# Patient Record
Sex: Female | Born: 1945
Health system: Southern US, Community
[De-identification: ages and names within clinical notes are randomized; demographics above are authoritative.]

## PROBLEM LIST (undated history)

## (undated) DIAGNOSIS — I10 Essential (primary) hypertension: Secondary | ICD-10-CM

## (undated) DIAGNOSIS — I480 Paroxysmal atrial fibrillation: Secondary | ICD-10-CM

## (undated) DIAGNOSIS — K219 Gastro-esophageal reflux disease without esophagitis: Secondary | ICD-10-CM

## (undated) DIAGNOSIS — T7840XA Allergy, unspecified, initial encounter: Secondary | ICD-10-CM

## (undated) DIAGNOSIS — I499 Cardiac arrhythmia, unspecified: Secondary | ICD-10-CM

## (undated) DIAGNOSIS — I341 Nonrheumatic mitral (valve) prolapse: Secondary | ICD-10-CM

## (undated) DIAGNOSIS — H269 Unspecified cataract: Secondary | ICD-10-CM

## (undated) DIAGNOSIS — E785 Hyperlipidemia, unspecified: Secondary | ICD-10-CM

## (undated) DIAGNOSIS — Z8049 Family history of malignant neoplasm of other genital organs: Secondary | ICD-10-CM

## (undated) DIAGNOSIS — G4730 Sleep apnea, unspecified: Secondary | ICD-10-CM

## (undated) DIAGNOSIS — Z801 Family history of malignant neoplasm of trachea, bronchus and lung: Secondary | ICD-10-CM

## (undated) DIAGNOSIS — E039 Hypothyroidism, unspecified: Secondary | ICD-10-CM

## (undated) DIAGNOSIS — M1990 Unspecified osteoarthritis, unspecified site: Secondary | ICD-10-CM

## (undated) DIAGNOSIS — M810 Age-related osteoporosis without current pathological fracture: Secondary | ICD-10-CM

## (undated) DIAGNOSIS — C801 Malignant (primary) neoplasm, unspecified: Secondary | ICD-10-CM

## (undated) DIAGNOSIS — Z9289 Personal history of other medical treatment: Secondary | ICD-10-CM

## (undated) DIAGNOSIS — Z8042 Family history of malignant neoplasm of prostate: Secondary | ICD-10-CM

## (undated) DIAGNOSIS — M797 Fibromyalgia: Secondary | ICD-10-CM

## (undated) DIAGNOSIS — I8390 Asymptomatic varicose veins of unspecified lower extremity: Secondary | ICD-10-CM

## (undated) DIAGNOSIS — E559 Vitamin D deficiency, unspecified: Secondary | ICD-10-CM

## (undated) DIAGNOSIS — E079 Disorder of thyroid, unspecified: Secondary | ICD-10-CM

## (undated) DIAGNOSIS — F419 Anxiety disorder, unspecified: Secondary | ICD-10-CM

## (undated) HISTORY — DX: Hyperlipidemia, unspecified: E78.5

## (undated) HISTORY — DX: Family history of malignant neoplasm of prostate: Z80.42

## (undated) HISTORY — DX: Nonrheumatic mitral (valve) prolapse: I34.1

## (undated) HISTORY — DX: Unspecified cataract: H26.9

## (undated) HISTORY — DX: Gastro-esophageal reflux disease without esophagitis: K21.9

## (undated) HISTORY — DX: Age-related osteoporosis without current pathological fracture: M81.0

## (undated) HISTORY — DX: Disorder of thyroid, unspecified: E07.9

## (undated) HISTORY — DX: Family history of malignant neoplasm of other genital organs: Z80.49

## (undated) HISTORY — DX: Family history of malignant neoplasm of trachea, bronchus and lung: Z80.1

## (undated) HISTORY — DX: Personal history of other medical treatment: Z92.89

## (undated) HISTORY — DX: Unspecified osteoarthritis, unspecified site: M19.90

## (undated) HISTORY — DX: Allergy, unspecified, initial encounter: T78.40XA

## (undated) HISTORY — DX: Fibromyalgia: M79.7

## (undated) HISTORY — DX: Asymptomatic varicose veins of unspecified lower extremity: I83.90

## (undated) HISTORY — DX: Sleep apnea, unspecified: G47.30

## (undated) HISTORY — DX: Paroxysmal atrial fibrillation: I48.0

## (undated) HISTORY — DX: Vitamin D deficiency, unspecified: E55.9

---

## 1987-01-11 HISTORY — PX: APPENDECTOMY: SHX54

## 1987-01-11 HISTORY — PX: ABDOMINAL HYSTERECTOMY: SHX81

## 1988-01-11 HISTORY — PX: NOSE SURGERY: SHX723

## 1993-01-10 HISTORY — PX: BREAST EXCISIONAL BIOPSY: SUR124

## 1996-06-06 ENCOUNTER — Encounter: Payer: Self-pay | Admitting: Internal Medicine

## 1997-11-26 ENCOUNTER — Ambulatory Visit (HOSPITAL_COMMUNITY): Admission: RE | Admit: 1997-11-26 | Discharge: 1997-11-26 | Payer: Self-pay | Admitting: Internal Medicine

## 1997-11-26 ENCOUNTER — Encounter: Payer: Self-pay | Admitting: Internal Medicine

## 1997-12-02 ENCOUNTER — Ambulatory Visit (HOSPITAL_COMMUNITY): Admission: RE | Admit: 1997-12-02 | Discharge: 1997-12-02 | Payer: Self-pay

## 1997-12-16 ENCOUNTER — Ambulatory Visit (HOSPITAL_COMMUNITY): Admission: RE | Admit: 1997-12-16 | Discharge: 1997-12-16 | Payer: Self-pay | Admitting: General Surgery

## 1997-12-16 ENCOUNTER — Encounter: Payer: Self-pay | Admitting: General Surgery

## 1998-03-25 ENCOUNTER — Other Ambulatory Visit: Admission: RE | Admit: 1998-03-25 | Discharge: 1998-03-25 | Payer: Self-pay | Admitting: Obstetrics and Gynecology

## 1999-01-14 ENCOUNTER — Ambulatory Visit (HOSPITAL_COMMUNITY): Admission: RE | Admit: 1999-01-14 | Discharge: 1999-01-14 | Payer: Self-pay | Admitting: Internal Medicine

## 1999-01-14 ENCOUNTER — Encounter: Payer: Self-pay | Admitting: Internal Medicine

## 1999-06-28 ENCOUNTER — Other Ambulatory Visit: Admission: RE | Admit: 1999-06-28 | Discharge: 1999-06-28 | Payer: Self-pay | Admitting: Obstetrics and Gynecology

## 2000-01-19 ENCOUNTER — Ambulatory Visit (HOSPITAL_COMMUNITY): Admission: RE | Admit: 2000-01-19 | Discharge: 2000-01-19 | Payer: Self-pay | Admitting: Internal Medicine

## 2000-01-19 ENCOUNTER — Encounter: Payer: Self-pay | Admitting: Internal Medicine

## 2000-06-27 ENCOUNTER — Other Ambulatory Visit: Admission: RE | Admit: 2000-06-27 | Discharge: 2000-06-27 | Payer: Self-pay | Admitting: Obstetrics and Gynecology

## 2000-11-20 ENCOUNTER — Encounter: Payer: Self-pay | Admitting: Internal Medicine

## 2000-11-20 HISTORY — PX: COLONOSCOPY: SHX174

## 2001-07-25 ENCOUNTER — Encounter: Payer: Self-pay | Admitting: Internal Medicine

## 2001-07-25 ENCOUNTER — Ambulatory Visit (HOSPITAL_COMMUNITY): Admission: RE | Admit: 2001-07-25 | Discharge: 2001-07-25 | Payer: Self-pay | Admitting: Internal Medicine

## 2001-11-26 ENCOUNTER — Other Ambulatory Visit: Admission: RE | Admit: 2001-11-26 | Discharge: 2001-11-26 | Payer: Self-pay | Admitting: *Deleted

## 2002-11-26 ENCOUNTER — Ambulatory Visit (HOSPITAL_COMMUNITY): Admission: RE | Admit: 2002-11-26 | Discharge: 2002-11-26 | Payer: Self-pay | Admitting: Internal Medicine

## 2002-11-29 ENCOUNTER — Other Ambulatory Visit: Admission: RE | Admit: 2002-11-29 | Discharge: 2002-11-29 | Payer: Self-pay | Admitting: Obstetrics and Gynecology

## 2002-12-16 ENCOUNTER — Ambulatory Visit (HOSPITAL_COMMUNITY): Admission: RE | Admit: 2002-12-16 | Discharge: 2002-12-16 | Payer: Self-pay | Admitting: Internal Medicine

## 2003-02-23 ENCOUNTER — Emergency Department (HOSPITAL_COMMUNITY): Admission: EM | Admit: 2003-02-23 | Discharge: 2003-02-24 | Payer: Self-pay | Admitting: *Deleted

## 2003-12-30 ENCOUNTER — Ambulatory Visit: Payer: Self-pay

## 2004-01-08 ENCOUNTER — Ambulatory Visit (HOSPITAL_COMMUNITY): Admission: RE | Admit: 2004-01-08 | Discharge: 2004-01-08 | Payer: Self-pay | Admitting: Internal Medicine

## 2005-01-26 ENCOUNTER — Ambulatory Visit: Payer: Self-pay | Admitting: Internal Medicine

## 2005-04-14 ENCOUNTER — Ambulatory Visit: Payer: Self-pay | Admitting: Endocrinology

## 2005-05-11 ENCOUNTER — Encounter: Payer: Self-pay | Admitting: Internal Medicine

## 2005-06-10 ENCOUNTER — Ambulatory Visit: Payer: Self-pay | Admitting: Internal Medicine

## 2005-06-23 ENCOUNTER — Ambulatory Visit: Payer: Self-pay | Admitting: Internal Medicine

## 2005-06-29 ENCOUNTER — Ambulatory Visit: Payer: Self-pay | Admitting: Internal Medicine

## 2006-03-28 ENCOUNTER — Ambulatory Visit: Payer: Self-pay | Admitting: Internal Medicine

## 2006-03-28 LAB — CONVERTED CEMR LAB
ALT: 11 units/L (ref 0–40)
AST: 16 units/L (ref 0–37)
Albumin: 3.5 g/dL (ref 3.5–5.2)
Alkaline Phosphatase: 63 units/L (ref 39–117)
BUN: 11 mg/dL (ref 6–23)
Basophils Absolute: 0.1 10*3/uL (ref 0.0–0.1)
Basophils Relative: 0.9 % (ref 0.0–1.0)
Bilirubin, Direct: 0.1 mg/dL (ref 0.0–0.3)
CO2: 30 meq/L (ref 19–32)
Calcium: 9.2 mg/dL (ref 8.4–10.5)
Chloride: 105 meq/L (ref 96–112)
Cholesterol: 199 mg/dL (ref 0–200)
Creatinine, Ser: 0.7 mg/dL (ref 0.4–1.2)
Eosinophils Absolute: 0.9 10*3/uL — ABNORMAL HIGH (ref 0.0–0.6)
Eosinophils Relative: 12.5 % — ABNORMAL HIGH (ref 0.0–5.0)
GFR calc Af Amer: 109 mL/min
GFR calc non Af Amer: 90 mL/min
Glucose, Bld: 88 mg/dL (ref 70–99)
HCT: 40.5 % (ref 36.0–46.0)
HDL: 52.2 mg/dL (ref >39.0)
Hemoglobin: 14.1 g/dL (ref 12.0–15.0)
LDL Cholesterol: 126 mg/dL — ABNORMAL HIGH (ref 0–99)
Lymphocytes Relative: 31.7 % (ref 12.0–46.0)
MCHC: 34.8 g/dL (ref 30.0–36.0)
MCV: 90.6 fL (ref 78.0–100.0)
Monocytes Absolute: 0.5 10*3/uL (ref 0.2–0.7)
Monocytes Relative: 7.3 % (ref 3.0–11.0)
Neutro Abs: 3.6 10*3/uL (ref 1.4–7.7)
Neutrophils Relative %: 47.6 % (ref 43.0–77.0)
Platelets: 217 10*3/uL (ref 150–400)
Potassium: 4 meq/L (ref 3.5–5.1)
RBC: 4.47 M/uL (ref 3.87–5.11)
RDW: 12.9 % (ref 11.5–14.6)
Sodium: 139 meq/L (ref 135–145)
TSH: 0.68 microintl units/mL (ref 0.35–5.50)
Total Bilirubin: 0.7 mg/dL (ref 0.3–1.2)
Total CHOL/HDL Ratio: 3.8
Total Protein: 6.3 g/dL (ref 6.0–8.3)
Triglycerides: 105 mg/dL (ref 0–149)
VLDL: 21 mg/dL (ref 0–40)
WBC: 7.5 10*3/uL (ref 4.5–10.5)

## 2006-04-24 ENCOUNTER — Ambulatory Visit: Payer: Self-pay | Admitting: Internal Medicine

## 2006-05-05 ENCOUNTER — Ambulatory Visit: Payer: Self-pay | Admitting: Internal Medicine

## 2006-05-08 ENCOUNTER — Ambulatory Visit: Payer: Self-pay

## 2006-07-03 ENCOUNTER — Ambulatory Visit: Payer: Self-pay | Admitting: Internal Medicine

## 2006-07-03 LAB — CONVERTED CEMR LAB: TSH: 0.78 microintl units/mL (ref 0.35–5.50)

## 2006-07-07 DIAGNOSIS — M949 Disorder of cartilage, unspecified: Secondary | ICD-10-CM

## 2006-07-07 DIAGNOSIS — M899 Disorder of bone, unspecified: Secondary | ICD-10-CM

## 2006-07-07 DIAGNOSIS — IMO0001 Reserved for inherently not codable concepts without codable children: Secondary | ICD-10-CM

## 2006-07-07 DIAGNOSIS — E039 Hypothyroidism, unspecified: Secondary | ICD-10-CM

## 2006-08-03 ENCOUNTER — Telehealth: Payer: Self-pay | Admitting: Internal Medicine

## 2006-08-07 ENCOUNTER — Ambulatory Visit (HOSPITAL_COMMUNITY): Admission: RE | Admit: 2006-08-07 | Discharge: 2006-08-07 | Payer: Self-pay | Admitting: Internal Medicine

## 2006-09-30 ENCOUNTER — Encounter: Payer: Self-pay | Admitting: *Deleted

## 2006-11-02 ENCOUNTER — Telehealth: Payer: Self-pay | Admitting: Internal Medicine

## 2006-11-03 ENCOUNTER — Telehealth: Payer: Self-pay | Admitting: Internal Medicine

## 2006-11-06 ENCOUNTER — Telehealth: Payer: Self-pay | Admitting: Internal Medicine

## 2006-12-06 ENCOUNTER — Ambulatory Visit: Payer: Self-pay | Admitting: Internal Medicine

## 2006-12-08 LAB — CONVERTED CEMR LAB
ALT: 12 units/L (ref 0–35)
AST: 13 units/L (ref 0–37)
Albumin: 3.8 g/dL (ref 3.5–5.2)
Alkaline Phosphatase: 70 units/L (ref 39–117)
BUN: 9 mg/dL (ref 6–23)
Bilirubin, Direct: 0.1 mg/dL (ref 0.0–0.3)
CO2: 28 meq/L (ref 19–32)
Calcium: 9.5 mg/dL (ref 8.4–10.5)
Chloride: 103 meq/L (ref 96–112)
Creatinine, Ser: 0.7 mg/dL (ref 0.4–1.2)
GFR calc Af Amer: 109 mL/min
GFR calc non Af Amer: 90 mL/min
Glucose, Bld: 95 mg/dL (ref 70–99)
Potassium: 3.8 meq/L (ref 3.5–5.1)
Sodium: 141 meq/L (ref 135–145)
Total Bilirubin: 0.8 mg/dL (ref 0.3–1.2)
Total Protein: 6.5 g/dL (ref 6.0–8.3)

## 2006-12-11 ENCOUNTER — Encounter: Admission: RE | Admit: 2006-12-11 | Discharge: 2006-12-11 | Payer: Self-pay | Admitting: Internal Medicine

## 2006-12-13 ENCOUNTER — Telehealth: Payer: Self-pay | Admitting: Internal Medicine

## 2007-01-12 ENCOUNTER — Telehealth: Payer: Self-pay | Admitting: Internal Medicine

## 2007-04-04 ENCOUNTER — Telehealth: Payer: Self-pay | Admitting: Internal Medicine

## 2007-08-08 ENCOUNTER — Ambulatory Visit: Payer: Self-pay | Admitting: Internal Medicine

## 2007-08-09 ENCOUNTER — Encounter: Payer: Self-pay | Admitting: Internal Medicine

## 2007-08-10 LAB — CONVERTED CEMR LAB
ALT: 13 units/L (ref 0–35)
Albumin: 3.9 g/dL (ref 3.5–5.2)
Alkaline Phosphatase: 70 units/L (ref 39–117)
BUN: 11 mg/dL (ref 6–23)
Bilirubin, Direct: 0.1 mg/dL (ref 0.0–0.3)
CO2: 31 meq/L (ref 19–32)
Eosinophils Relative: 10.4 % — ABNORMAL HIGH (ref 0.0–5.0)
Glucose, Bld: 88 mg/dL (ref 70–99)
HCT: 40.2 % (ref 36.0–46.0)
Hemoglobin: 13.7 g/dL (ref 12.0–15.0)
Lymphocytes Relative: 29.9 % (ref 12.0–46.0)
Monocytes Absolute: 0.5 10*3/uL (ref 0.1–1.0)
Monocytes Relative: 7.1 % (ref 3.0–12.0)
Neutro Abs: 3.6 10*3/uL (ref 1.4–7.7)
Platelets: 177 10*3/uL (ref 150–400)
Potassium: 4.1 meq/L (ref 3.5–5.1)
RDW: 12.9 % (ref 11.5–14.6)
Sodium: 142 meq/L (ref 135–145)
Total Protein: 6.5 g/dL (ref 6.0–8.3)
WBC: 6.8 10*3/uL (ref 4.5–10.5)

## 2007-10-10 ENCOUNTER — Ambulatory Visit (HOSPITAL_COMMUNITY): Admission: RE | Admit: 2007-10-10 | Discharge: 2007-10-10 | Payer: Self-pay | Admitting: Internal Medicine

## 2007-12-20 ENCOUNTER — Telehealth: Payer: Self-pay | Admitting: Internal Medicine

## 2008-03-12 ENCOUNTER — Ambulatory Visit: Payer: Self-pay | Admitting: Internal Medicine

## 2008-03-14 ENCOUNTER — Telehealth: Payer: Self-pay | Admitting: Internal Medicine

## 2008-03-20 ENCOUNTER — Telehealth: Payer: Self-pay | Admitting: Internal Medicine

## 2008-04-01 ENCOUNTER — Telehealth: Payer: Self-pay | Admitting: Internal Medicine

## 2008-04-02 ENCOUNTER — Telehealth: Payer: Self-pay | Admitting: Internal Medicine

## 2008-04-15 ENCOUNTER — Ambulatory Visit: Payer: Self-pay | Admitting: Internal Medicine

## 2008-04-15 DIAGNOSIS — I7300 Raynaud's syndrome without gangrene: Secondary | ICD-10-CM

## 2008-04-15 LAB — CONVERTED CEMR LAB
Nitrite: NEGATIVE
Protein, U semiquant: NEGATIVE
Urobilinogen, UA: 0.2
WBC Urine, dipstick: NEGATIVE
pH: 6.5

## 2008-04-22 LAB — CONVERTED CEMR LAB
ALT: 11 units/L (ref 0–35)
AST: 15 units/L (ref 0–37)
BUN: 12 mg/dL (ref 6–23)
Basophils Relative: 0.9 % (ref 0.0–3.0)
Bilirubin, Direct: 0.1 mg/dL (ref 0.0–0.3)
Eosinophils Relative: 9.5 % — ABNORMAL HIGH (ref 0.0–5.0)
GFR calc non Af Amer: 89.52 mL/min (ref >60)
HCT: 39.4 % (ref 36.0–46.0)
HDL: 59.3 mg/dL (ref >39.00)
Lymphs Abs: 2.1 10*3/uL (ref 0.7–4.0)
Monocytes Relative: 6 % (ref 3.0–12.0)
Platelets: 194 10*3/uL (ref 150.0–400.0)
Potassium: 3.5 meq/L (ref 3.5–5.1)
RBC: 4.36 M/uL (ref 3.87–5.11)
Sodium: 141 meq/L (ref 135–145)
TSH: 0.56 microintl units/mL (ref 0.35–5.50)
Total Bilirubin: 0.9 mg/dL (ref 0.3–1.2)
Total CHOL/HDL Ratio: 4
VLDL: 25.2 mg/dL (ref 0.0–40.0)
WBC: 6.5 10*3/uL (ref 4.5–10.5)

## 2008-04-24 ENCOUNTER — Ambulatory Visit: Payer: Self-pay | Admitting: Internal Medicine

## 2008-04-28 DIAGNOSIS — Z9289 Personal history of other medical treatment: Secondary | ICD-10-CM

## 2008-04-28 HISTORY — DX: Personal history of other medical treatment: Z92.89

## 2008-04-30 ENCOUNTER — Encounter: Payer: Self-pay | Admitting: Internal Medicine

## 2008-05-02 ENCOUNTER — Encounter: Payer: Self-pay | Admitting: Internal Medicine

## 2008-05-08 ENCOUNTER — Encounter: Payer: Self-pay | Admitting: Internal Medicine

## 2008-05-27 ENCOUNTER — Encounter: Payer: Self-pay | Admitting: Internal Medicine

## 2008-06-03 ENCOUNTER — Telehealth: Payer: Self-pay | Admitting: Gastroenterology

## 2008-06-03 ENCOUNTER — Ambulatory Visit: Payer: Self-pay | Admitting: Gastroenterology

## 2008-06-03 DIAGNOSIS — K219 Gastro-esophageal reflux disease without esophagitis: Secondary | ICD-10-CM | POA: Insufficient documentation

## 2008-06-04 ENCOUNTER — Telehealth: Payer: Self-pay | Admitting: Physician Assistant

## 2008-06-05 ENCOUNTER — Encounter: Payer: Self-pay | Admitting: Gastroenterology

## 2008-06-10 ENCOUNTER — Telehealth: Payer: Self-pay | Admitting: Physician Assistant

## 2008-06-11 ENCOUNTER — Encounter: Admission: RE | Admit: 2008-06-11 | Discharge: 2008-06-11 | Payer: Self-pay | Admitting: Gastroenterology

## 2008-08-11 ENCOUNTER — Telehealth: Payer: Self-pay | Admitting: *Deleted

## 2008-08-15 ENCOUNTER — Telehealth: Payer: Self-pay | Admitting: Gastroenterology

## 2008-08-18 ENCOUNTER — Telehealth: Payer: Self-pay | Admitting: Internal Medicine

## 2008-10-01 ENCOUNTER — Encounter: Payer: Self-pay | Admitting: Internal Medicine

## 2008-10-01 ENCOUNTER — Ambulatory Visit: Payer: Self-pay | Admitting: Family Medicine

## 2008-10-03 ENCOUNTER — Ambulatory Visit: Payer: Self-pay | Admitting: Internal Medicine

## 2008-10-17 ENCOUNTER — Ambulatory Visit (HOSPITAL_COMMUNITY): Admission: RE | Admit: 2008-10-17 | Discharge: 2008-10-17 | Payer: Self-pay | Admitting: Internal Medicine

## 2008-11-24 ENCOUNTER — Encounter (INDEPENDENT_AMBULATORY_CARE_PROVIDER_SITE_OTHER): Payer: Self-pay | Admitting: *Deleted

## 2008-12-11 ENCOUNTER — Encounter (INDEPENDENT_AMBULATORY_CARE_PROVIDER_SITE_OTHER): Payer: Self-pay | Admitting: *Deleted

## 2009-04-07 ENCOUNTER — Encounter: Payer: Self-pay | Admitting: Internal Medicine

## 2009-07-30 ENCOUNTER — Encounter: Payer: Self-pay | Admitting: Internal Medicine

## 2009-09-01 ENCOUNTER — Telehealth: Payer: Self-pay | Admitting: Internal Medicine

## 2009-09-10 ENCOUNTER — Ambulatory Visit: Payer: Self-pay | Admitting: Internal Medicine

## 2009-09-10 LAB — CONVERTED CEMR LAB
Ketones, urine, test strip: NEGATIVE
Nitrite: NEGATIVE
Urobilinogen, UA: 0.2

## 2009-09-15 LAB — CONVERTED CEMR LAB
Alkaline Phosphatase: 56 units/L (ref 39–117)
Basophils Relative: 0.8 % (ref 0.0–3.0)
Bilirubin, Direct: 0.1 mg/dL (ref 0.0–0.3)
Calcium: 9.2 mg/dL (ref 8.4–10.5)
Creatinine, Ser: 0.7 mg/dL (ref 0.4–1.2)
Eosinophils Absolute: 0.7 10*3/uL (ref 0.0–0.7)
Eosinophils Relative: 8.4 % — ABNORMAL HIGH (ref 0.0–5.0)
GFR calc non Af Amer: 97.08 mL/min (ref >60)
HDL: 52.8 mg/dL (ref >39.00)
LDL Cholesterol: 62 mg/dL (ref 0–99)
Lymphocytes Relative: 25.1 % (ref 12.0–46.0)
Neutrophils Relative %: 59.3 % (ref 43.0–77.0)
RBC: 4.39 M/uL (ref 3.87–5.11)
Total CHOL/HDL Ratio: 3
Total Protein: 6.4 g/dL (ref 6.0–8.3)
Triglycerides: 144 mg/dL (ref 0.0–149.0)
WBC: 8.5 10*3/uL (ref 4.5–10.5)

## 2009-09-17 ENCOUNTER — Encounter: Payer: Self-pay | Admitting: Internal Medicine

## 2009-12-11 ENCOUNTER — Ambulatory Visit (HOSPITAL_COMMUNITY)
Admission: RE | Admit: 2009-12-11 | Discharge: 2009-12-11 | Payer: Self-pay | Source: Home / Self Care | Admitting: Internal Medicine

## 2010-01-20 ENCOUNTER — Telehealth: Payer: Self-pay | Admitting: Internal Medicine

## 2010-01-26 ENCOUNTER — Telehealth: Payer: Self-pay | Admitting: Internal Medicine

## 2010-01-31 ENCOUNTER — Encounter: Payer: Self-pay | Admitting: Internal Medicine

## 2010-02-01 ENCOUNTER — Encounter: Payer: Self-pay | Admitting: Gastroenterology

## 2010-02-09 NOTE — Progress Notes (Signed)
Summary: question about alprazolam  Phone Note From Pharmacy Call back at [phone]   Caller: Hunt Oris, [place] Call For: swords  Summary of Call: Refill Alprazolam 0.5mg  one by mouth up to three times a day #30 This was ordered by Dr Nicki Guadalajara from [facility] on 02/17/09, 03/23/09, 05/08/09, and 06/26/09.  Now they want PCP to take overprescribing. We had her down to 1/2 once daily and last ordered on 10/28/08 with #15 x2 at same pharmacy.  What do you want to do now? Initial call taken by: Gladis Riffle, RN,  September 01, 2009 1:58 PM  Follow-up for Phone Call        decrease to 1 by mouth once daily as needed anxiety #20/3 refills---each Rx must last 1 month Follow-up by: Birdie Sons MD,  September 02, 2009 9:07 AM  Additional Follow-up for Phone Call Additional follow up Details #1::        see Rx. Additional Follow-up by: Gladis Riffle, RN,  September 02, 2009 10:28 AM    New/Updated Medications: ALPRAZOLAM 0.5 MG TABS (ALPRAZOLAM) Take 1 tablet by mouth once a day as needed anxiety--each must last 30 days Prescriptions: ALPRAZOLAM 0.5 MG TABS (ALPRAZOLAM) Take 1 tablet by mouth once a day as needed anxiety--each must last 30 days  #20 x 3   Entered by:   Gladis Riffle, RN   Authorized by:   Birdie Sons MD   Signed by:   Gladis Riffle, RN on 09/02/2009   Method used:   Telephoned to ...       [facility]  [address] [facility] (retail)       [address]       [place], [place]  [address]       Ph: [phone]       Fax: [phone]   RxID:   [id number]

## 2010-02-09 NOTE — Letter (Signed)
Summary: [facility]  [facility]   Imported By: Maryln Gottron 10/20/2009 14:52:19  _____________________________________________________________________  External Attachment:    Type:   Image     Comment:   External Document

## 2010-02-09 NOTE — Letter (Signed)
Summary: [facility]  [facility]   Imported By: Maryln Gottron 04/21/2009 13:43:50  _____________________________________________________________________  External Attachment:    Type:   Image     Comment:   External Document

## 2010-02-09 NOTE — Assessment & Plan Note (Signed)
Summary: emp/pt coming in fasting/cjr   Vital Signs:  Patient profile:   65 year old female Menstrual status:  hysterectomy Height:      61 inches Weight:      130 pounds BMI:     24.65 Pulse rate:   52 / minute Pulse rhythm:   regular Resp:     12 per minute BP sitting:   110 / 68  (left arm) Cuff size:   regular  Vitals Entered By: Gladis Riffle, RN (September 10, 2009 8:42 AM) CC: ANNUAL REVIEW OF SYSTEMS, FASTING Is Patient Diabetic? No     Menstrual Status hysterectomy   CC:  ANNUAL REVIEW OF SYSTEMS and FASTING.  Preventive Screening-Counseling & Management  Alcohol-Tobacco     Smoking Status: quit     Year Quit: 1990  Current Medications (verified): 1)  Alprazolam 0.5 Mg Tabs (Alprazolam) .... Take 1 Tablet By Mouth Once A Day As Needed Anxiety--Each Must Last 30 Days 2)  Levothyroxine Sodium 75 Mcg Tabs (Levothyroxine Sodium) .... Take 1 Tablet By Mouth Once A Day 3)  Fluticasone Propionate 50 Mcg/act Susp (Fluticasone Propionate) .... Use One To Two Sprays Each Nostril Once Daily 4)  Multivitamins  Tabs (Multiple Vitamin) .... Once Daily 5)  Fish Oil Concentrate 300 Mg Caps (Omega-3 Fatty Acids) .... 2 Two Times A Day 6)  Vitamin C Cr 500 Mg Cr-Tabs (Ascorbic Acid) .... Once Daily 7)  Caltrate 600+d 600-400 Mg-Unit Tabs (Calcium Carbonate-Vitamin D) .... Two Times A Day 8)  Bayer Aspirin 325 Mg Tabs (Aspirin) .... Take 1 Daily 9)  Pantoprazole Sodium 40 Mg Tbec (Pantoprazole Sodium) .... Take 1 Tablet By Mouth Once A Day 10)  Metoprolol Succinate 25 Mg Xr24h-Tab (Metoprolol Succinate) .... Once Daily 11)  Vitamin D3 1000 Unit Tabs (Cholecalciferol) .... Two Times A Day  Allergies: 1)  ! Tramadol Hcl (Tramadol Hcl)  Past History:  Past Medical History: Last updated: 06/03/2008 Hypothyroidism Osteopenia mild MVP & MR fibromyalgia INTERNAL HEMORRHOIDS  Past Surgical History: Last updated: 07/07/2006 Hysterectomy, total--no ca  Family History: Last  updated: 11-Dec-2006 dtr deceased---congenital disorder, multiple endocrine abnormalities including DM  Social History: Last updated: 06/03/2008 Married Regular exercise-no NONSMOKER NO ETOH  Risk Factors: Exercise: no (Dec 11, 2006)  Risk Factors: Smoking Status: quit (09/10/2009)  Physical Exam  General:  alert and well-developed.   Head:  normocephalic and atraumatic.   Eyes:  pupils equal and pupils round.   Neck:  No deformities, masses, or tenderness noted. Lungs:  normal respiratory effort.   Heart:  normal rate and regular rhythm.   Abdomen:  soft and non-tender.   Skin:  turgor normal and color normal.   Psych:  good eye contact and not anxious appearing.     Impression & Recommendations:  Problem # 1:  PREVENTIVE HEALTH CARE (ICD-V70.0)  Orders: Venipuncture (82956) UA Dipstick w/o Micro (automated)  (81003) Specimen Handling (21308) TLB-Lipid Panel (80061-LIPID) TLB-BMP (Basic Metabolic Panel-BMET) (80048-METABOL) TLB-CBC Platelet - w/Differential (85025-CBCD) TLB-Hepatic/Liver Function Pnl (80076-HEPATIC) TLB-TSH (Thyroid Stimulating Hormone) (84443-TSH)  Complete Medication List: 1)  Alprazolam 0.5 Mg Tabs (Alprazolam) .... Take 1 tablet by mouth once a day as needed anxiety--each must last 30 days 2)  Levothyroxine Sodium 75 Mcg Tabs (Levothyroxine sodium) .... Take 1 tablet by mouth once a day 3)  Fluticasone Propionate 50 Mcg/act Susp (Fluticasone propionate) .... Use one to two sprays each nostril once daily 4)  Multivitamins Tabs (Multiple vitamin) .... Once daily 5)  Fish Oil Concentrate 300 Mg  Caps (Omega-3 fatty acids) .... 2 two times a day 6)  Vitamin C Cr 500 Mg Cr-tabs (Ascorbic acid) .... Once daily 7)  Caltrate 600+d 600-400 Mg-unit Tabs (Calcium carbonate-vitamin d) .... Two times a day 8)  Bayer Aspirin 325 Mg Tabs (Aspirin) .... Take 1 daily 9)  Pantoprazole Sodium 40 Mg Tbec (Pantoprazole sodium) .... Take 1 tablet by mouth once a  day 10)  Metoprolol Succinate 25 Mg Xr24h-tab (Metoprolol succinate) .... Once daily 11)  Vitamin D3 1000 Unit Tabs (Cholecalciferol) .... Two times a day  Other Orders: Pneumococcal Vaccine (16109) Admin 1st Vaccine (60454)   Contraindications/Deferment of Procedures/Staging:    Test/Procedure: FLU VAX    Reason for deferment: patient declined     Test/Procedure: Zoster vaccine    Reason for deferment: declined     Immunizations Administered:  Pneumonia Vaccine:    Vaccine Type: Pneumovax    Site: left deltoid    Mfr: Merck    Dose: 0.5 ml    Route: IM    Given by: Gladis Riffle, RN    Exp. Date: 01/31/2011    Lot #: [id number]    VIS given: 08/08/95 version given September 10, 2009.   Laboratory Results   Urine Tests    Routine Urinalysis   Color: yellow Appearance: Clear Glucose: negative   (Normal Range: Negative) Bilirubin: negative   (Normal Range: Negative) Ketone: negative   (Normal Range: Negative) Spec. Gravity: 1.020   (Normal Range: 1.003-1.035) Blood: trace-intact   (Normal Range: Negative) pH: 7.0   (Normal Range: 5.0-8.0) Protein: negative   (Normal Range: Negative) Urobilinogen: 0.2   (Normal Range: 0-1) Nitrite: negative   (Normal Range: Negative) Leukocyte Esterace: negative   (Normal Range: Negative)    Comments: Rita Ohara  September 10, 2009 10:18 AM

## 2010-02-09 NOTE — Letter (Signed)
Summary: Medical Clearance for Exercise Program  Medical Clearance for Exercise Program   Imported By: Maryln Gottron 07/31/2009 13:25:54  _____________________________________________________________________  External Attachment:    Type:   Image     Comment:   External Document

## 2010-02-09 NOTE — Progress Notes (Signed)
Summary: sinus  Phone Note Call from Patient Call back at [phone]   Caller: vm Call For: Sabino Denning Summary of Call: Appt Dr. Cato Mulligan for bronchial problem.  Already over here in [place]. Initial call taken by: Rudy Jew, RN,  December 20, 2007 1:37 PM  Follow-up for Phone Call        Dr. Cato Mulligan out of office rest of day.  Mucinex, all the water she can drink, humidifier, warm wet washcloth soak over nose & sinuses, ov as needed.   Follow-up by: Rudy Jew, RN,  December 20, 2007 2:18 PM

## 2010-02-11 NOTE — Progress Notes (Signed)
Summary: Pt needs # 90 of Metoprolol not 30  Phone Note Call from Patient Call back at Home Phone [phone]   Caller: Patient Summary of Call: Pt said that Metoprolol needs to be a 90day supply. Pls changed qty to 90. [facility] in [place]. Pt req that this be called in before Dr Cato Mulligan leaves today.  Initial call taken by: Lucy Antigua,  January 26, 2010 11:15 AM  Follow-up for Phone Call        Phone Call Completed, Rx Called In Follow-up by: Alfred Levins, CMA,  January 26, 2010 12:23 PM    Prescriptions: METOPROLOL SUCCINATE 25 MG XR24H-TAB (METOPROLOL SUCCINATE) once daily  #90 x 3   Entered by:   Alfred Levins, CMA   Authorized by:   Birdie Sons MD   Signed by:   Alfred Levins, CMA on 01/26/2010   Method used:   Electronically to        [facility]  [address] (retail)       [address]       [place], [place]  [address]       Ph: [phone]       Fax: [phone]   RxID:   [id number]

## 2010-02-11 NOTE — Progress Notes (Signed)
Summary: Med Question  Phone Note Refill Request Call back at Home Phone [phone] Message from:  Patient-live call  Refills Requested: Medication #1:  METOPROLOL SUCCINATE 25 MG XR24H-TAB once daily Dr. Tresa Endo can't prescribe this//wants to know if Dr. Cato Mulligan can call it in for her. Wants it called into the [facility] in [place].  Initial call taken by: Georgian Co,  January 20, 2010 12:12 PM  Follow-up for Phone Call        Rx called to pharmacy Follow-up by: Alfred Levins, CMA,  January 20, 2010 1:02 PM    Prescriptions: METOPROLOL SUCCINATE 25 MG XR24H-TAB (METOPROLOL SUCCINATE) once daily  #30 x 5   Entered by:   Alfred Levins, CMA   Authorized by:   Birdie Sons MD   Signed by:   Alfred Levins, CMA on 01/20/2010   Method used:   Electronically to        [facility] (retail)       [address]       [place], [place]  [address]       Ph: [phone] or [phone]       Fax: [phone]   RxID:   [id number]   Appended Document: Med Question     Prescriptions: METOPROLOL SUCCINATE 25 MG XR24H-TAB (METOPROLOL SUCCINATE) once daily  #30 x 5   Entered by:   Alfred Levins, CMA   Authorized by:   Birdie Sons MD   Signed by:   Alfred Levins, CMA on 01/25/2010   Method used:   Electronically to        [facility]  [address] (retail)       [address]       [place], [place]  [address]       Ph: [phone]       Fax: [phone]   RxID:   [id number]

## 2010-03-29 ENCOUNTER — Other Ambulatory Visit: Payer: Self-pay | Admitting: Internal Medicine

## 2010-04-06 ENCOUNTER — Telehealth: Payer: Self-pay | Admitting: Internal Medicine

## 2010-04-06 DIAGNOSIS — F419 Anxiety disorder, unspecified: Secondary | ICD-10-CM

## 2010-04-06 MED ORDER — ALPRAZOLAM 0.5 MG PO TABS: 0.5000 mg | ORAL_TABLET | Freq: Every day | ORAL | Status: DC | PRN

## 2010-04-06 NOTE — Telephone Encounter (Signed)
rx called into pharmacy

## 2010-04-06 NOTE — Telephone Encounter (Signed)
Pls req refill Xanax 0.5 mg to [facility] in [place]. Pt is completely out of med.

## 2010-08-21 ENCOUNTER — Other Ambulatory Visit: Payer: Self-pay | Admitting: Internal Medicine

## 2010-08-27 ENCOUNTER — Other Ambulatory Visit: Payer: Self-pay | Admitting: Gastroenterology

## 2010-08-27 NOTE — Telephone Encounter (Signed)
Pt needs to be seen in the office before any further refills

## 2010-09-28 ENCOUNTER — Other Ambulatory Visit: Payer: Self-pay | Admitting: Internal Medicine

## 2010-10-16 ENCOUNTER — Other Ambulatory Visit: Payer: Self-pay | Admitting: Internal Medicine

## 2010-10-18 ENCOUNTER — Other Ambulatory Visit: Payer: Self-pay | Admitting: Internal Medicine

## 2010-10-25 ENCOUNTER — Other Ambulatory Visit: Payer: Self-pay | Admitting: Internal Medicine

## 2010-10-27 NOTE — Telephone Encounter (Signed)
Pt need refill on alprazolam 0.5mg  [facility] [phone]

## 2010-10-27 NOTE — Telephone Encounter (Signed)
Pt needs office visit

## 2010-10-29 ENCOUNTER — Encounter: Payer: Self-pay | Admitting: Family Medicine

## 2010-10-29 ENCOUNTER — Ambulatory Visit (INDEPENDENT_AMBULATORY_CARE_PROVIDER_SITE_OTHER): Payer: Medicare Other | Admitting: Family Medicine

## 2010-10-29 ENCOUNTER — Ambulatory Visit (INDEPENDENT_AMBULATORY_CARE_PROVIDER_SITE_OTHER)
Admission: RE | Admit: 2010-10-29 | Discharge: 2010-10-29 | Disposition: A | Discharge status: Home / Self Care | Payer: Medicare Other | Source: Ambulatory Visit | Attending: Family Medicine | Admitting: Family Medicine

## 2010-10-29 VITALS — BP 110/70 | Temp 98.2°F | Ht 64.0 in | Wt 138.0 lb

## 2010-10-29 DIAGNOSIS — M25579 Pain in unspecified ankle and joints of unspecified foot: Secondary | ICD-10-CM

## 2010-10-29 DIAGNOSIS — M25571 Pain in right ankle and joints of right foot: Secondary | ICD-10-CM

## 2010-10-29 NOTE — Patient Instructions (Signed)
If x-ray unremarkable ice R ankle 2-3 times daily for 20-30 minutes per application.

## 2010-10-29 NOTE — Progress Notes (Signed)
  Subjective:    Patient ID: Haley [name], female    DOB: April 23, 1945, 65 y.o.   MRN: [id number]  HPI  Acute visit. Right ankle pain. Worse over the past 2 weeks but intermittently paranoid for months. No injury. Pain somewhat poorly localized. Mostly anterior ankle. No history of gout. No redness or warmth. No ecchymosis. No Achilles pain. She has not tried any icing or anti-inflammatories. Using ankle brace with mild improvement.   Review of Systems  Constitutional: Negative for fever and chills.  Musculoskeletal: Negative for myalgias and joint swelling.  Skin: Negative for rash.  Hematological: Negative for adenopathy.       Objective:   Physical Exam  Constitutional: She appears well-developed and well-nourished.  Cardiovascular: Normal rate and regular rhythm.   Pulmonary/Chest: Effort normal and breath sounds normal. No respiratory distress. She has no wheezes. She has no rales.  Musculoskeletal:       Right ankle reveals full range of motion. No ecchymosis. No edema or effusion. No warmth. Normal distal foot pulses. No Achilles tenderness. Achilles intact. Minimally tender extensor tendons anterior ankle. No bony tenderness          Assessment & Plan:  Right ankle pain. Suspect tendinitis. Given duration check x-rays. If normal, recommend regular icing and consider possible Voltaren gel.

## 2010-11-01 ENCOUNTER — Telehealth: Payer: Self-pay | Admitting: Internal Medicine

## 2010-11-01 MED ORDER — ALPRAZOLAM 0.5 MG PO TABS: 0.5000 mg | ORAL_TABLET | Freq: Every evening | ORAL | Status: DC | PRN

## 2010-11-01 MED ORDER — HYDROCODONE-ACETAMINOPHEN 5-325 MG PO TABS: 1.0000 | ORAL_TABLET | Freq: Four times a day (QID) | ORAL | Status: AC | PRN

## 2010-11-01 NOTE — Telephone Encounter (Signed)
See comments under X-ray results.  May refill Xanax once.  There is NO opioid pain medication that has no risk of sedation. Her best bet is to take Tylenol and if tolerated try Aleve.  Also needs to be icing foot twice daily as instructed as suspect she has some tendonitis.

## 2010-11-01 NOTE — Progress Notes (Signed)
Quick Note:  Pt informed ______ 

## 2010-11-01 NOTE — Telephone Encounter (Signed)
Called in #20 Alprazolam, pt informed.  Pt states she has been icing her foot/ankle 2 times a day.  She cannot take Aleve, and has been using Tylenol with no relief.  Pt states she will not use the pain med when driving, but states she really needs something," had a terrible weekend".

## 2010-11-01 NOTE — Telephone Encounter (Signed)
vicodin 5/325 mg 1-2 po q6 hours prn #30 with no refill.

## 2010-11-01 NOTE — Telephone Encounter (Addendum)
Pt would like pain med [phone]. Pt is going on cruise soon requesting pain med that will not make her sleepy

## 2010-11-01 NOTE — Telephone Encounter (Signed)
Pt informed, Rx will be called in.

## 2010-11-01 NOTE — Telephone Encounter (Signed)
Saw Dr Caryl Never on Friday and had Xray. She is requesting the results. Also, refill Alprazolam 0.5 mg to [facility]. Please return her call today, please. Thanks.

## 2010-11-16 ENCOUNTER — Other Ambulatory Visit: Payer: Self-pay | Admitting: Internal Medicine

## 2010-11-16 DIAGNOSIS — Z1231 Encounter for screening mammogram for malignant neoplasm of breast: Secondary | ICD-10-CM

## 2010-12-03 ENCOUNTER — Ambulatory Visit: Payer: Self-pay | Admitting: Internal Medicine

## 2010-12-16 ENCOUNTER — Encounter: Payer: Self-pay | Admitting: Internal Medicine

## 2010-12-16 ENCOUNTER — Ambulatory Visit (HOSPITAL_COMMUNITY)
Admission: RE | Admit: 2010-12-16 | Discharge: 2010-12-16 | Disposition: A | Discharge status: Home / Self Care | Payer: Medicare Other | Source: Ambulatory Visit | Attending: Internal Medicine | Admitting: Internal Medicine

## 2010-12-16 ENCOUNTER — Ambulatory Visit (INDEPENDENT_AMBULATORY_CARE_PROVIDER_SITE_OTHER): Payer: Medicare Other | Admitting: Internal Medicine

## 2010-12-16 VITALS — BP 122/68 | HR 68 | Temp 98.1°F | Ht 61.5 in | Wt 138.0 lb

## 2010-12-16 DIAGNOSIS — Z0000 Encounter for general adult medical examination without abnormal findings: Secondary | ICD-10-CM

## 2010-12-16 DIAGNOSIS — Z79899 Other long term (current) drug therapy: Secondary | ICD-10-CM

## 2010-12-16 DIAGNOSIS — Z1231 Encounter for screening mammogram for malignant neoplasm of breast: Secondary | ICD-10-CM | POA: Insufficient documentation

## 2010-12-16 LAB — BASIC METABOLIC PANEL
BUN: 15 mg/dL (ref 6–23)
CO2: 29 mEq/L (ref 19–32)
Glucose, Bld: 107 mg/dL — ABNORMAL HIGH (ref 70–99)
Potassium: 4.5 mEq/L (ref 3.5–5.1)
Sodium: 143 mEq/L (ref 135–145)

## 2010-12-16 LAB — SEDIMENTATION RATE: Sed Rate: 17 mm/hr (ref 0–22)

## 2010-12-16 LAB — LIPID PANEL
Cholesterol: 204 mg/dL — ABNORMAL HIGH (ref 0–200)
VLDL: 68.4 mg/dL — ABNORMAL HIGH (ref 0.0–40.0)

## 2010-12-16 LAB — CBC WITH DIFFERENTIAL/PLATELET
Basophils Absolute: 0.1 10*3/uL (ref 0.0–0.1)
Eosinophils Absolute: 0.9 10*3/uL — ABNORMAL HIGH (ref 0.0–0.7)
HCT: 39.9 % (ref 36.0–46.0)
Hemoglobin: 13.5 g/dL (ref 12.0–15.0)
Lymphs Abs: 2.1 10*3/uL (ref 0.7–4.0)
MCHC: 33.8 g/dL (ref 30.0–36.0)
MCV: 92.4 fl (ref 78.0–100.0)
Monocytes Absolute: 0.5 10*3/uL (ref 0.1–1.0)
Neutro Abs: 4.1 10*3/uL (ref 1.4–7.7)
Platelets: 197 10*3/uL (ref 150.0–400.0)
RDW: 13.9 % (ref 11.5–14.6)

## 2010-12-16 LAB — HEPATIC FUNCTION PANEL
ALT: 16 U/L (ref 0–35)
AST: 16 U/L (ref 0–37)
Alkaline Phosphatase: 69 U/L (ref 39–117)
Bilirubin, Direct: 0 mg/dL (ref 0.0–0.3)
Total Bilirubin: 0.3 mg/dL (ref 0.3–1.2)

## 2010-12-16 LAB — POCT URINALYSIS DIPSTICK
Ketones, UA: NEGATIVE
Protein, UA: NEGATIVE
Spec Grav, UA: 1.025

## 2010-12-16 NOTE — Progress Notes (Signed)
Patient ID: Haley [name], female   DOB: Dec 08, 1945, 65 y.o.   MRN: [id number]  CPX  Past Medical History  Diagnosis Date  . Thyroid disease   . OA (osteoarthritis)   . MVP (mitral valve prolapse)     mild and MR  . Fibromyalgia   . Hemorrhoids     internal    History   Social History  . Marital Status: Married    Spouse Name: N/A    Number of Children: N/A  . Years of Education: N/A   Occupational History  . Not on file.   Social History Main Topics  . Smoking status: Never Smoker   . Smokeless tobacco: Not on file  . Alcohol Use:   . Drug Use:   . Sexually Active:    Other Topics Concern  . Not on file   Social History Narrative  . No narrative on file    Past Surgical History  Procedure Date  . Abdominal hysterectomy     Family History  Problem Relation Age of Onset  . Congenital heart disease Daughter   . Diabetes Daughter     Allergies  Allergen Reactions  . Tramadol Hcl     REACTION: jittery    Current Outpatient Prescriptions on File Prior to Visit  Medication Sig Dispense Refill  . ALPRAZolam (XANAX) 0.5 MG tablet Take 1 tablet (0.5 mg total) by mouth at bedtime as needed.  20 tablet  0  . Calcium Carbonate (CALTRATE 600) 1500 MG TABS Take by mouth.        . cholecalciferol (VITAMIN D) 1000 UNITS tablet Take 1,000 Units by mouth daily.        . fluticasone (FLONASE) 50 MCG/ACT nasal spray USE ONE TO TWO SPRAYS IN EACH NOSTRIL   ONCE A DAY  16 g  3  . levothyroxine (SYNTHROID, LEVOTHROID) 75 MCG tablet Take 75 mcg by mouth daily.        . metoprolol (TOPROL-XL) 100 MG 24 hr tablet Take 100 mg by mouth daily.        . Multiple Vitamins-Minerals (MULTIVITAMIN WITH MINERALS) tablet Take 1 tablet by mouth daily.        . pantoprazole (PROTONIX) 40 MG tablet TAKE ONE TABLET BY MOUTH EVERY DAY  30 tablet  2  . vitamin C (ASCORBIC ACID) 500 MG tablet Take 500 mg by mouth daily.           patient denies chest pain, shortness of breath, orthopnea.  Denies lower extremity edema, abdominal pain, change in appetite, change in bowel movements. Patient denies rashes, musculoskeletal complaints. No other specific complaints in a complete review of systems.   BP 122/68  Pulse 68  Temp(Src) 98.1 F (36.7 C) (Oral)  Ht 5' 1.5" (1.562 m)  Wt 138 lb (62.596 kg)  BMI 25.65 kg/m2  Well-developed well-nourished female in no acute distress. HEENT exam atraumatic, normocephalic, extraocular muscles are intact. Neck is supple. No jugular venous distention no thyromegaly. Chest clear to auscultation without increased work of breathing. Cardiac exam S1 and S2 are regular. Abdominal exam active bowel sounds, soft, nontender. Extremities no edema. Neurologic exam she is alert without any motor sensory deficits. Gait is normal.   A/P--Well visit  Health Maint UTD.

## 2010-12-21 ENCOUNTER — Other Ambulatory Visit: Payer: Self-pay | Admitting: Family Medicine

## 2010-12-21 ENCOUNTER — Other Ambulatory Visit: Payer: Self-pay | Admitting: Gastroenterology

## 2010-12-21 NOTE — Telephone Encounter (Signed)
Dr. Swords pt 

## 2010-12-24 ENCOUNTER — Other Ambulatory Visit: Payer: Self-pay | Admitting: Gastroenterology

## 2010-12-24 DIAGNOSIS — Z0000 Encounter for general adult medical examination without abnormal findings: Secondary | ICD-10-CM

## 2010-12-30 ENCOUNTER — Other Ambulatory Visit (HOSPITAL_COMMUNITY): Payer: Self-pay | Admitting: Obstetrics and Gynecology

## 2011-01-17 ENCOUNTER — Other Ambulatory Visit: Payer: Self-pay | Admitting: Internal Medicine

## 2011-01-17 NOTE — Telephone Encounter (Signed)
Pt is out of med [employer] [phone]

## 2011-01-18 ENCOUNTER — Ambulatory Visit (INDEPENDENT_AMBULATORY_CARE_PROVIDER_SITE_OTHER): Payer: Medicare Other | Admitting: Family

## 2011-01-18 ENCOUNTER — Encounter: Payer: Self-pay | Admitting: Family

## 2011-01-18 VITALS — BP 130/80 | Temp 98.3°F | Wt 141.0 lb

## 2011-01-18 DIAGNOSIS — N3000 Acute cystitis without hematuria: Secondary | ICD-10-CM

## 2011-01-18 DIAGNOSIS — R300 Dysuria: Secondary | ICD-10-CM

## 2011-01-18 LAB — POCT URINALYSIS DIPSTICK
Bilirubin, UA: NEGATIVE
Glucose, UA: NEGATIVE
Nitrite, UA: POSITIVE
Urobilinogen, UA: 0.2

## 2011-01-18 MED ORDER — SULFAMETHOXAZOLE-TRIMETHOPRIM 800-160 MG PO TABS: 1.0000 | ORAL_TABLET | Freq: Two times a day (BID) | ORAL | Status: AC

## 2011-01-18 NOTE — Progress Notes (Signed)
Subjective:    Patient ID: Haley [name], female    DOB: [birth year], 66 y.o.   MRN: [id number]  HPI 66 year old white female, patient of Dr. Cato Mulligan is in today with complaints of urinary frequency, burning with urination, and lower abdominal pain x3-4 days. She date the counter Cystex that has helped her symptoms have not resolved. She denies any back pain, blood in her urine, or foul-smelling urine.   Review of Systems  Constitutional: Negative.   Respiratory: Negative.   Cardiovascular: Negative.   Gastrointestinal: Positive for abdominal pain.       Lower abdominal pain  Genitourinary: Positive for dysuria, urgency, frequency and pelvic pain.  Musculoskeletal: Negative.   Psychiatric/Behavioral: Negative.    Past Medical History  Diagnosis Date  . Thyroid disease   . OA (osteoarthritis)   . MVP (mitral valve prolapse)     mild and MR  . Fibromyalgia   . Hemorrhoids     internal    History   Social History  . Marital Status: Married    Spouse Name: N/A    Number of Children: N/A  . Years of Education: N/A   Occupational History  . Not on file.   Social History Main Topics  . Smoking status: Never Smoker   . Smokeless tobacco: Not on file  . Alcohol Use:   . Drug Use:   . Sexually Active:    Other Topics Concern  . Not on file   Social History Narrative  . No narrative on file    Past Surgical History  Procedure Date  . Abdominal hysterectomy     Family History  Problem Relation Age of Onset  . Congenital heart disease Daughter   . Diabetes Daughter     Allergies  Allergen Reactions  . Tramadol Hcl     REACTION: jittery    Current Outpatient Prescriptions on File Prior to Visit  Medication Sig Dispense Refill  . ALPRAZolam (XANAX) 0.5 MG tablet TAKE ONE TABLET BY MOUTH AS NEEDED  20 tablet  3  . aspirin 81 MG tablet Take 81 mg by mouth daily.        . Calcium Carbonate (CALTRATE 600) 1500 MG TABS Take by mouth.        . cholecalciferol  (VITAMIN D) 1000 UNITS tablet Take 1,000 Units by mouth daily.        . fluticasone (FLONASE) 50 MCG/ACT nasal spray USE ONE TO TWO SPRAYS IN EACH NOSTRIL   ONCE A DAY  16 g  3  . levothyroxine (SYNTHROID, LEVOTHROID) 75 MCG tablet TAKE ONE TABLET BY MOUTH EVERY DAY  90 tablet  1  . metoprolol (TOPROL-XL) 100 MG 24 hr tablet Take 100 mg by mouth daily.        . Multiple Vitamins-Minerals (MULTIVITAMIN WITH MINERALS) tablet Take 1 tablet by mouth daily.        . pantoprazole (PROTONIX) 40 MG tablet TAKE ONE TABLET BY MOUTH EVERY DAY  30 tablet  3  . vitamin C (ASCORBIC ACID) 500 MG tablet Take 500 mg by mouth daily.          BP 130/80  Temp(Src) 98.3 F (36.8 C) (Oral)  Wt 141 lb (63.957 kg)chart    Objective:   Physical Exam  Constitutional: She is oriented to person, place, and time. She appears well-developed and well-nourished.  Cardiovascular: Normal rate, regular rhythm and normal heart sounds.   Pulmonary/Chest: Effort normal and breath sounds normal.  Abdominal: Soft.  Tenderness noted over the bladder. No rebound tenderness or guarding.  Musculoskeletal: Normal range of motion.  Neurological: She is alert and oriented to person, place, and time.  Skin: Skin is warm and dry.          Assessment & Plan:  Assessment: Acute cystitis, dysuria  Plan: Bactrim DS 1 tablet twice a day x7 days. Drink plenty of fluids. Avoid caffeine. Complete antibiotic therapy. Patient to call if symptoms worsen or persist, recheck in

## 2011-01-18 NOTE — Patient Instructions (Signed)

## 2011-02-01 ENCOUNTER — Ambulatory Visit (HOSPITAL_COMMUNITY)
Admission: RE | Admit: 2011-02-01 | Discharge: 2011-02-01 | Disposition: A | Discharge status: Home / Self Care | Payer: Medicare Other | Source: Ambulatory Visit | Attending: Obstetrics and Gynecology | Admitting: Obstetrics and Gynecology

## 2011-02-01 DIAGNOSIS — Z780 Asymptomatic menopausal state: Secondary | ICD-10-CM | POA: Insufficient documentation

## 2011-02-01 DIAGNOSIS — Z13820 Encounter for screening for osteoporosis: Secondary | ICD-10-CM | POA: Insufficient documentation

## 2011-02-08 DIAGNOSIS — Z9289 Personal history of other medical treatment: Secondary | ICD-10-CM

## 2011-02-08 HISTORY — DX: Personal history of other medical treatment: Z92.89

## 2011-07-18 ENCOUNTER — Other Ambulatory Visit: Payer: Self-pay | Admitting: Internal Medicine

## 2011-07-27 ENCOUNTER — Ambulatory Visit (INDEPENDENT_AMBULATORY_CARE_PROVIDER_SITE_OTHER): Payer: Medicare Other | Admitting: Family Medicine

## 2011-07-27 ENCOUNTER — Encounter: Payer: Self-pay | Admitting: Family Medicine

## 2011-07-27 VITALS — BP 128/74 | HR 64 | Temp 98.2°F | Wt 141.0 lb

## 2011-07-27 DIAGNOSIS — N390 Urinary tract infection, site not specified: Secondary | ICD-10-CM

## 2011-07-27 MED ORDER — SULFAMETHOXAZOLE-TRIMETHOPRIM 800-160 MG PO TABS: 1.0000 | ORAL_TABLET | Freq: Two times a day (BID) | ORAL | Status: DC | PRN

## 2011-07-27 NOTE — Progress Notes (Signed)
  Subjective:    Patient ID: Haley [name], female    DOB: [birth year], 66 y.o.   MRN: [id number]  HPI Here for 3 days of urinary burning and urgency. No fever or nausea. She was treated for a UTI last January with Bactrim.    Review of Systems  Constitutional: Negative.   Gastrointestinal: Negative.   Genitourinary: Positive for dysuria and frequency. Negative for hematuria, flank pain and pelvic pain.       Objective:   Physical Exam  Constitutional: She appears well-developed and well-nourished.  Abdominal: Soft. Bowel sounds are normal. She exhibits no distension and no mass. There is no tenderness. There is no rebound and no guarding.          Assessment & Plan:  She has been using an OTC numbing agent so we are unable to get an accurate UA. Treat with Bactrim DS and drink fluids

## 2011-11-01 ENCOUNTER — Other Ambulatory Visit: Payer: Self-pay | Admitting: Internal Medicine

## 2011-11-08 ENCOUNTER — Telehealth: Payer: Self-pay | Admitting: Internal Medicine

## 2011-11-08 MED ORDER — ZOSTER VACCINE LIVE 19400 UNT/0.65ML ~~LOC~~ SOLR: 0.6500 mL | Freq: Once | SUBCUTANEOUS | Status: DC

## 2011-11-08 NOTE — Telephone Encounter (Signed)
rx sent in electronically 

## 2011-11-08 NOTE — Telephone Encounter (Signed)
Pt called and said that she needs to get a script for shingles vax to [place] [phone].

## 2011-11-30 ENCOUNTER — Other Ambulatory Visit: Payer: Self-pay | Admitting: Internal Medicine

## 2011-11-30 DIAGNOSIS — Z1231 Encounter for screening mammogram for malignant neoplasm of breast: Secondary | ICD-10-CM

## 2011-12-21 ENCOUNTER — Ambulatory Visit (HOSPITAL_COMMUNITY)
Admission: RE | Admit: 2011-12-21 | Discharge: 2011-12-21 | Disposition: A | Discharge status: Home / Self Care | Payer: Medicare Other | Source: Ambulatory Visit | Attending: Internal Medicine | Admitting: Internal Medicine

## 2011-12-21 DIAGNOSIS — Z1231 Encounter for screening mammogram for malignant neoplasm of breast: Secondary | ICD-10-CM

## 2012-02-09 ENCOUNTER — Other Ambulatory Visit: Payer: Self-pay | Admitting: Internal Medicine

## 2012-02-10 ENCOUNTER — Other Ambulatory Visit: Payer: Self-pay | Admitting: Gastroenterology

## 2012-02-10 ENCOUNTER — Other Ambulatory Visit: Payer: Self-pay | Admitting: Internal Medicine

## 2012-02-10 MED ORDER — PANTOPRAZOLE SODIUM 40 MG PO TBEC: 40.0000 mg | DELAYED_RELEASE_TABLET | Freq: Every day | ORAL | Status: DC

## 2012-02-10 NOTE — Telephone Encounter (Signed)
And pt. Wants to know when she will be due for a colonoscopy

## 2012-02-10 NOTE — Telephone Encounter (Signed)
Pt needs appt, Dr Cato Mulligan has not seen her since 12/2010.  I know she has an appt in September but that is coming up to 2 years.  She will need one sooner

## 2012-02-10 NOTE — Telephone Encounter (Signed)
Need to order this patients chart. No recall is in for her colonoscopy and the 09 procedure has the wrong report in it. Someone scanned a 1998 endo. L/M for pt that we would return her call Medication sent to [facility]

## 2012-02-10 NOTE — Telephone Encounter (Signed)
Pt needs new rxs fax to [facility] [phone] levothyroxine 75 mcg#90  And fluticasone 50 mcg #3 with 3 refills

## 2012-02-14 MED ORDER — FLUTICASONE PROPIONATE 50 MCG/ACT NA SUSP: 2.0000 | Freq: Every day | NASAL | Status: DC

## 2012-02-14 MED ORDER — LEVOTHYROXINE SODIUM 75 MCG PO TABS: 75.0000 ug | ORAL_TABLET | Freq: Every day | ORAL | Status: DC

## 2012-02-14 NOTE — Telephone Encounter (Signed)
Pt rsc cpx until 04-24-2012

## 2012-02-14 NOTE — Telephone Encounter (Signed)
rx sent in electronically 

## 2012-02-22 NOTE — Telephone Encounter (Signed)
ORDERED CHART

## 2012-03-02 ENCOUNTER — Ambulatory Visit (INDEPENDENT_AMBULATORY_CARE_PROVIDER_SITE_OTHER): Payer: Medicare Other | Admitting: Family Medicine

## 2012-03-02 ENCOUNTER — Encounter: Payer: Self-pay | Admitting: Family Medicine

## 2012-03-02 ENCOUNTER — Ambulatory Visit (INDEPENDENT_AMBULATORY_CARE_PROVIDER_SITE_OTHER)
Admission: RE | Admit: 2012-03-02 | Discharge: 2012-03-02 | Disposition: A | Discharge status: Home / Self Care | Payer: Medicare Other | Source: Ambulatory Visit | Attending: Family Medicine | Admitting: Family Medicine

## 2012-03-02 VITALS — BP 130/80 | HR 57 | Temp 98.3°F | Wt 145.0 lb

## 2012-03-02 DIAGNOSIS — R05 Cough: Secondary | ICD-10-CM

## 2012-03-02 DIAGNOSIS — J209 Acute bronchitis, unspecified: Secondary | ICD-10-CM

## 2012-03-02 DIAGNOSIS — R079 Chest pain, unspecified: Secondary | ICD-10-CM

## 2012-03-02 MED ORDER — AZITHROMYCIN 250 MG PO TABS: ORAL_TABLET | ORAL | Status: DC

## 2012-03-02 NOTE — Progress Notes (Signed)
  Subjective:    Patient ID: Haley [name], female    DOB: 07-Nov-1945, 66 y.o.   MRN: [id number]  HPI Here for one week of chest congestion and a dry cough. No fever. Also for 2 months she has had a constant sharp pain in the left ribs. No recent trauma but she does lift weights at her gym.    Review of Systems  Constitutional: Negative.   HENT: Negative.   Eyes: Negative.   Respiratory: Positive for cough and chest tightness. Negative for shortness of breath and wheezing.   Cardiovascular: Positive for chest pain. Negative for palpitations and leg swelling.       Objective:   Physical Exam  Constitutional: She appears well-developed and well-nourished. No distress.  Neck: No thyromegaly present.  Cardiovascular: Normal rate, regular rhythm, normal heart sounds and intact distal pulses.   Pulmonary/Chest: Effort normal and breath sounds normal. No respiratory distress. She has no wheezes. She has no rales.  Tender along the left ribs just lateral to thee breast   Lymphadenopathy:    She has no cervical adenopathy.          Assessment & Plan:  Treat with a Zpack. Get a CXR and rib films.

## 2012-03-05 NOTE — Telephone Encounter (Signed)
Britta Mccreedy, did you ever locate this chart Thanks

## 2012-03-05 NOTE — Progress Notes (Signed)
Quick Note:  I spoke with pt ______ 

## 2012-03-07 NOTE — Telephone Encounter (Signed)
Explained to pt that last colon was 11-20-2000 That she was past due. She said she would call back to schedule

## 2012-03-07 NOTE — Telephone Encounter (Signed)
Last Colonoscopy in 2002. Patient is past due for colonoscopy

## 2012-03-19 ENCOUNTER — Encounter: Payer: Self-pay | Admitting: Cardiovascular Disease

## 2012-03-21 ENCOUNTER — Encounter: Payer: Self-pay | Admitting: Gastroenterology

## 2012-04-24 ENCOUNTER — Encounter: Payer: Medicare Other | Admitting: Internal Medicine

## 2012-05-22 ENCOUNTER — Telehealth: Payer: Self-pay | Admitting: Internal Medicine

## 2012-05-22 NOTE — Telephone Encounter (Addendum)
Pt would like to switch to you, Dr Amador Cunas, from Dr Cato Mulligan.  She has requested you due to availability reasons and you being an Internal MD.  Dr Cato Mulligan, pt would like to switch to Dr Amador Cunas, it that OK?

## 2012-05-22 NOTE — Telephone Encounter (Signed)
ok 

## 2012-05-22 NOTE — Telephone Encounter (Signed)
Phone number will not work, calling restrictions

## 2012-05-22 NOTE — Telephone Encounter (Signed)
Verbal ok per Dr Cato Mulligan if ok with Dr Kirtland Bouchard

## 2012-05-23 ENCOUNTER — Other Ambulatory Visit: Payer: Self-pay | Admitting: *Deleted

## 2012-05-23 ENCOUNTER — Telehealth: Payer: Self-pay | Admitting: *Deleted

## 2012-05-23 MED ORDER — SIMVASTATIN 20 MG PO TABS: 20.0000 mg | ORAL_TABLET | Freq: Every day | ORAL | Status: DC

## 2012-05-23 MED ORDER — METOPROLOL TARTRATE 25 MG PO TABS: 12.5000 mg | ORAL_TABLET | Freq: Two times a day (BID) | ORAL | Status: DC

## 2012-05-23 NOTE — Telephone Encounter (Signed)
Pt needs refill of levothyroxine (SYNTHROID, LEVOTHROID) 75 MCG tablet.  Pt has an appt (transfer) w/ Dr Amador Cunas for CPE, but pt's cpe had been cancelled several times my MD. Pt's med is due to be refilled.  Could you refill one more time for pt? [facility]

## 2012-05-24 MED ORDER — LEVOTHYROXINE SODIUM 75 MCG PO TABS: 75.0000 ug | ORAL_TABLET | Freq: Every day | ORAL | Status: DC

## 2012-05-24 NOTE — Telephone Encounter (Signed)
rx sent in electronically 

## 2012-05-25 NOTE — Telephone Encounter (Signed)
Pt aware/kh 

## 2012-05-31 ENCOUNTER — Telehealth (HOSPITAL_COMMUNITY): Payer: Self-pay | Admitting: Cardiovascular Disease

## 2012-05-31 NOTE — Telephone Encounter (Signed)
Calling again about taking too much Metoprolol!

## 2012-05-31 NOTE — Telephone Encounter (Signed)
Call to pt.  Stated BP was 102/61 HR 56 @12 :35pm.  Pt w/o complaints.  Advised to keep appt on Tuesday.

## 2012-05-31 NOTE — Telephone Encounter (Signed)
Returned call.  Pt stated she got up this morning and took a whole pill instead of a half of her heart medicine, metoprolol.  Pt stated the bottle says to go to the ER for overdose and informed that dose is not considered an overdose.  Pt took BP while on phone and it was 139/72 HR 58.  Pt advised NOT to take the night dose, drink plenty of fluids and check BP if she develops any symptoms of low BP (dizziness, lightheadedness, vision changes, HA) and call back.  Pt stated she called the pharmacy and was told to do the same thing.  Stated she is forgetful about taking the metoprolol b/c she is supposed to take it twice a day and wants Dr. Tresa Endo to do an override so she can get back on the medicine she was on that was once a day.  Stated she doesn't think this medicine is working with her.  Pt informed she would need to take the medicine consistently in order to determine if it is/is not working for her.  Advised she schedule an appt w/ Dr. Tresa Endo since she has concerns about her medicine.  Pt verbalized understanding and agreed w/ plan.  Appt scheduled w/ Dr. Tresa Endo on 5.27.14 @ 11:15am in the [place] office.  Chart requested from [place] office. Pt will call back around 12noon with updated  BP.

## 2012-06-05 ENCOUNTER — Encounter: Payer: Self-pay | Admitting: Cardiovascular Disease

## 2012-06-05 ENCOUNTER — Ambulatory Visit (INDEPENDENT_AMBULATORY_CARE_PROVIDER_SITE_OTHER): Payer: Medicare Other | Admitting: Cardiovascular Disease

## 2012-06-05 VITALS — BP 118/60 | HR 51 | Ht 62.0 in | Wt 143.2 lb

## 2012-06-05 DIAGNOSIS — IMO0001 Reserved for inherently not codable concepts without codable children: Secondary | ICD-10-CM

## 2012-06-05 DIAGNOSIS — F419 Anxiety disorder, unspecified: Secondary | ICD-10-CM

## 2012-06-05 DIAGNOSIS — I10 Essential (primary) hypertension: Secondary | ICD-10-CM

## 2012-06-05 DIAGNOSIS — F411 Generalized anxiety disorder: Secondary | ICD-10-CM

## 2012-06-05 DIAGNOSIS — R002 Palpitations: Secondary | ICD-10-CM

## 2012-06-05 DIAGNOSIS — E785 Hyperlipidemia, unspecified: Secondary | ICD-10-CM | POA: Insufficient documentation

## 2012-06-05 MED ORDER — METOPROLOL SUCCINATE ER 25 MG PO TB24: 25.0000 mg | ORAL_TABLET | Freq: Every day | ORAL | Status: DC

## 2012-06-05 NOTE — Patient Instructions (Addendum)
Your physician recommends that you schedule a follow-up appointment in:  

## 2012-06-05 NOTE — Progress Notes (Signed)
Patient ID: JERRILYN MESSINGER, female   DOB: 16-Feb-1945, 67 y.o.   MRN: [id number]  HPI: Haley [name], is a 67 y.o. female who has a history of palpitations with paroxysmal atrial fibrillation, hyperlipidemia, GERD, and hypothyroidism. The past she has been found to have an atherogenic dyslipidemia lipid panel. She also has a history of lower extremity varicosities he does wear support stockings. She states she's been having difficulty remembering to take her metoprolol tartrate on a twice a day regimen the she does admit at times to being anxious to prefer once a day medication she has been significantly trying to adjust her diet. She did recently undergo an MR lipoprotein a lipid panel which now showed marked improvement with a cholesterol of 174, triglycerides 119, HDL C. cholesterol 60, and LDL C. cholesterol at 90. Her LDL particle number was 1020 and her HDL particle number was 39.5. Her insulin resistance score was 39 which was normal.  Past Medical History  Diagnosis Date  . Thyroid disease   . OA (osteoarthritis)   . MVP (mitral valve prolapse)     mild and MR  . Fibromyalgia   . Hemorrhoids     internal    Past Surgical History  Procedure Laterality Date  . Abdominal hysterectomy      Allergies  Allergen Reactions  . Tramadol Hcl     REACTION: jittery    Current Outpatient Prescriptions  Medication Sig Dispense Refill  . ALPRAZolam (XANAX) 0.5 MG tablet TAKE ONE TABLET BY MOUTH EVERY DAY AS NEEDED  20 tablet  2  . aspirin 81 MG tablet Take 81 mg by mouth daily.        . Calcium Carbonate (CALTRATE 600) 1500 MG TABS Take by mouth.        . cholecalciferol (VITAMIN D) 1000 UNITS tablet Take 1,000 Units by mouth daily.        . fluticasone (FLONASE) 50 MCG/ACT nasal spray Place 2 sprays into the nose daily.  16 g  0  . levothyroxine (SYNTHROID, LEVOTHROID) 75 MCG tablet Take 1 tablet (75 mcg total) by mouth daily.  90 tablet  0  . Multiple Vitamins-Minerals (MULTIVITAMIN WITH  MINERALS) tablet Take 1 tablet by mouth daily.        . pantoprazole (PROTONIX) 40 MG tablet Take 1 tablet (40 mg total) by mouth daily.  90 tablet  3  . simvastatin (ZOCOR) 20 MG tablet Take 1 tablet (20 mg total) by mouth at bedtime.  90 tablet  3  . vitamin C (ASCORBIC ACID) 500 MG tablet Take 500 mg by mouth daily.        . metoprolol succinate (TOPROL XL) 25 MG 24 hr tablet Take 1 tablet (25 mg total) by mouth daily.  90 tablet  3  . zoster vaccine live, PF, (ZOSTAVAX) 78295 UNT/0.65ML injection Inject 19,400 Units into the skin once.  1 each  0   No current facility-administered medications for this visit.    Socially she is married and has 2 children and 4 grandchildren with one great grandchild. She smokes little cigars. [facility].  ROS is negative for fever chills night sweats. She does get anxious at times and does note palpitations. She oftentimes forgets to take her evening dose of metoprolol. She denies paresthesias. She denies cough. She denies presyncope or syncope she does have varicose veins. She denies chest pressure. She denies wheezing. He denies bleeding. Other system review  is negative.  PE BP 118/60  Pulse 51  Ht 5\' 2"  (1.575 m)  Wt 143 lb 3.2 oz (64.955 kg)  BMI 26.18 kg/m2  General: Alert, oriented, no distress.  HEENT: Normocephalic, atraumatic. Pupils round and reactive; sclera anicteric;  Nose without nasal septal hypertrophy Mouth/Parynx benign; Mallinpatti scale 2 Neck: No JVD, no carotid briuts Lungs: clear to ausculatation and percussion; no wheezing or rales Heart: RRR, s1 s2 normal 1/6 SEM Abdomen: soft, nontender; no hepatosplenomehaly, BS+; abdominal aorta nontender and not dilated by palpation. Pulses 2+ Extremities: no clubbinbg cyanosis or edema, Homan's sign negative  Neurologic: grossly nonfocal  ECG: Sinus rhythm at 51 beats per minute. She has poor anterior R-wave  progression.  LABS:  BMET    Component Value Date/Time   NA 143 12/16/2010 0921   K 4.5 12/16/2010 0921   CL 109 12/16/2010 0921   CO2 29 12/16/2010 0921   GLUCOSE 107* 12/16/2010 0921   BUN 15 12/16/2010 0921   CREATININE 0.8 12/16/2010 0921   CALCIUM 9.0 12/16/2010 0921   GFRNONAA 97.08 09/10/2009 0918   GFRAA 93 08/08/2007 1222     Hepatic Function Panel     Component Value Date/Time   PROT 6.6 12/16/2010 0921   ALBUMIN 3.7 12/16/2010 0921   AST 16 12/16/2010 0921   ALT 16 12/16/2010 0921   ALKPHOS 69 12/16/2010 0921   BILITOT 0.3 12/16/2010 0921   BILIDIR 0.0 12/16/2010 0921     CBC    Component Value Date/Time   WBC 7.6 12/16/2010 0921   RBC 4.32 12/16/2010 0921   HGB 13.5 12/16/2010 0921   HCT 39.9 12/16/2010 0921   PLT 197.0 12/16/2010 0921   MCV 92.4 12/16/2010 0921   MCHC 33.8 12/16/2010 0921   RDW 13.9 12/16/2010 0921   LYMPHSABS 2.1 12/16/2010 0921   MONOABS 0.5 12/16/2010 0921   EOSABS 0.9* 12/16/2010 0921   BASOSABS 0.1 12/16/2010 0921     BNP No results found for this basename: probnp    Lipid Panel     Component Value Date/Time   CHOL 204* 12/16/2010 0921   TRIG 342.0* 12/16/2010 0921   HDL 49.30 12/16/2010 0921   CHOLHDL 4 12/16/2010 0921   VLDL 68.4* 12/16/2010 0921   LDLCALC 62 09/10/2009 0918     RADIOLOGY: No results found.    ASSESSMENT AND PLAN: Is only, Ms. Pineau is maintaining sinus rhythm. I will change her metoprolol tartrate from 12.5 twice a day to metoprolol succinate 25 mg daily. She was told that if her resting pulse gets below 50 to reduce this to just 12.5 mg daily. I did review her NMR lipoprotein in detail and there is significant improvement from her remote laboratory which showed marked hypertriglyceridemia and elevation of her VLDL. LDL particle now is 1020. She was wondering about taking her medicine for anxiety. She now sees Dr. Amador Cunas in place of Dr. Cato Mulligan. I suggested that Dr. Amador Cunas would be the one to prescribe her Ativan. As  long as she remains stable, I will see her in 6 months for followup evaluation.     Lennette Bihari, MD, [facility]  06/05/2012 4:11 PM

## 2012-06-08 ENCOUNTER — Other Ambulatory Visit: Payer: Self-pay | Admitting: *Deleted

## 2012-06-09 ENCOUNTER — Encounter: Payer: Self-pay | Admitting: *Deleted

## 2012-06-11 ENCOUNTER — Telehealth: Payer: Self-pay | Admitting: Cardiovascular Disease

## 2012-06-11 NOTE — Telephone Encounter (Signed)
Please call [facility]-question about the prescription-phone#[phone] -Please call today if possible!

## 2012-06-12 NOTE — Telephone Encounter (Signed)
Message forwarded to W. Waddell, CMA.  

## 2012-06-13 NOTE — Telephone Encounter (Signed)
Called phone number left and gave reference number. Representative states she wasn't "pulling anything up" on this patient.

## 2012-06-15 ENCOUNTER — Encounter: Payer: Self-pay | Admitting: Internal Medicine

## 2012-06-19 ENCOUNTER — Encounter: Payer: Self-pay | Admitting: Internal Medicine

## 2012-06-22 ENCOUNTER — Other Ambulatory Visit: Payer: Self-pay | Admitting: *Deleted

## 2012-06-22 MED ORDER — FLUTICASONE PROPIONATE 50 MCG/ACT NA SUSP: 2.0000 | Freq: Every day | NASAL | Status: DC

## 2012-06-26 ENCOUNTER — Encounter: Payer: Self-pay | Admitting: Internal Medicine

## 2012-06-26 ENCOUNTER — Ambulatory Visit (INDEPENDENT_AMBULATORY_CARE_PROVIDER_SITE_OTHER): Payer: Medicare Other | Admitting: Internal Medicine

## 2012-06-26 VITALS — BP 140/86 | HR 53 | Temp 98.2°F | Resp 20 | Wt 145.0 lb

## 2012-06-26 DIAGNOSIS — F411 Generalized anxiety disorder: Secondary | ICD-10-CM

## 2012-06-26 DIAGNOSIS — F419 Anxiety disorder, unspecified: Secondary | ICD-10-CM

## 2012-06-26 DIAGNOSIS — R220 Localized swelling, mass and lump, head: Secondary | ICD-10-CM

## 2012-06-26 DIAGNOSIS — E039 Hypothyroidism, unspecified: Secondary | ICD-10-CM

## 2012-06-26 MED ORDER — ALPRAZOLAM 0.5 MG PO TABS: ORAL_TABLET | ORAL | Status: DC

## 2012-06-26 NOTE — Progress Notes (Signed)
Subjective:    Patient ID: Haley [name], female    DOB: Sep 26, 1945, 67 y.o.   MRN: [id number]  HPI  67 year old patient who is in today complaining of the facial swelling following a cryotherapy procedure on June 5.  She had a followup 5 days later and again yesterday. She's complaining of numbness and swelling especially involving the left facial area.  She is requesting a refill on alprazolam she has a history of anxiety and also hypothyroidism.  Past Medical History  Diagnosis Date  . Thyroid disease   . OA (osteoarthritis)   . MVP (mitral valve prolapse)     mild and MR  . Fibromyalgia   . Hemorrhoids     internal  . Paroxysmal a-fib   . Hyperlipidemia   . GERD (gastroesophageal reflux disease)   . Varicosities of leg   . H/O echocardiogram 04/28/08    EF>55% trace mitral regurgitation, No significant valvular pathology  . History of stress test 02/08/2011    Normal Myocardial perfusion study, this is a low risk scan, No prior study available for comparison    History   Social History  . Marital Status: Married    Spouse Name: N/A    Number of Children: N/A  . Years of Education: N/A   Occupational History  . Not on file.   Social History Main Topics  . Smoking status: Current Some Day Smoker    Types: Cigars  . Smokeless tobacco: Never Used     Comment: peach flavor   . Alcohol Use: No  . Drug Use: No  . Sexually Active: Not on file   Other Topics Concern  . Not on file   Social History Narrative  . No narrative on file    Past Surgical History  Procedure Laterality Date  . Abdominal hysterectomy  1989  . Nose surgery  1990    Family History  Problem Relation Age of Onset  . Diabetes Daughter   . Diabetes Daughter   . Diabetes Mother   . Stroke Maternal Grandmother   . Hypertension Mother     Allergies  Allergen Reactions  . Tramadol Hcl     REACTION: jittery    Current Outpatient Prescriptions on File Prior to Visit  Medication Sig  Dispense Refill  . ALPRAZolam (XANAX) 0.5 MG tablet TAKE ONE TABLET BY MOUTH EVERY DAY AS NEEDED  20 tablet  2  . aspirin 81 MG tablet Take 81 mg by mouth daily.        . Calcium Carbonate (CALTRATE 600) 1500 MG TABS Take by mouth.        . cholecalciferol (VITAMIN D) 1000 UNITS tablet Take 1,000 Units by mouth daily.        . fluticasone (FLONASE) 50 MCG/ACT nasal spray Place 2 sprays into the nose daily.  48 g  3  . levothyroxine (SYNTHROID, LEVOTHROID) 75 MCG tablet Take 1 tablet (75 mcg total) by mouth daily.  90 tablet  0  . metoprolol succinate (TOPROL XL) 25 MG 24 hr tablet Take 1 tablet (25 mg total) by mouth daily.  90 tablet  3  . Multiple Vitamins-Minerals (MULTIVITAMIN WITH MINERALS) tablet Take 1 tablet by mouth daily.        . pantoprazole (PROTONIX) 40 MG tablet Take 1 tablet (40 mg total) by mouth daily.  90 tablet  3  . simvastatin (ZOCOR) 20 MG tablet Take 1 tablet (20 mg total) by mouth at bedtime.  90 tablet  3  .  vitamin C (ASCORBIC ACID) 500 MG tablet Take 500 mg by mouth daily.        Marland Kitchen zoster vaccine live, PF, (ZOSTAVAX) 95284 UNT/0.65ML injection Inject 19,400 Units into the skin once.  1 each  0   No current facility-administered medications on file prior to visit.    BP 140/86  Pulse 53  Temp(Src) 98.2 F (36.8 C) (Oral)  Resp 20  Wt 145 lb (65.772 kg)  BMI 26.51 kg/m2  SpO2 94%       Review of Systems  Constitutional: Negative.   HENT: Negative for hearing loss, congestion, sore throat, rhinorrhea, dental problem, sinus pressure and tinnitus.   Eyes: Negative for pain, discharge and visual disturbance.  Respiratory: Negative for cough and shortness of breath.   Cardiovascular: Negative for chest pain, palpitations and leg swelling.  Gastrointestinal: Negative for nausea, vomiting, abdominal pain, diarrhea, constipation, blood in stool and abdominal distention.  Genitourinary: Negative for dysuria, urgency, frequency, hematuria, flank pain, vaginal  bleeding, vaginal discharge, difficulty urinating, vaginal pain and pelvic pain.  Musculoskeletal: Negative for joint swelling, arthralgias and gait problem.  Skin: Positive for wound. Negative for rash.  Neurological: Negative for dizziness, syncope, speech difficulty, weakness, numbness and headaches.  Hematological: Negative for adenopathy.  Psychiatric/Behavioral: Negative for behavioral problems, dysphoric mood and agitation. The patient is not nervous/anxious.        Objective:   Physical Exam  Constitutional: She appears well-developed and well-nourished. No distress.  Repeat blood pressure 120/78  Skin:  Resolving lesions of cryotherapy involving the left lateral face in the left upper outer arm. No right facial lesion appreciated There was a suggestion of some puffiness involving the mouth or areas          Assessment & Plan:   Status post cryotherapy. The patient was reassured. It was suggested that she complete antibiotic therapy prescribed by dermatology and observe over the next 2 weeks. She was told that if symptoms were related to the cryotherapy, they should resolve over the next 2 weeks Anxiety disorder. Alprazolam refilled

## 2012-07-12 ENCOUNTER — Other Ambulatory Visit: Payer: Self-pay | Admitting: Internal Medicine

## 2012-07-12 NOTE — Telephone Encounter (Signed)
ok 

## 2012-07-18 ENCOUNTER — Encounter: Payer: Self-pay | Admitting: Internal Medicine

## 2012-07-18 ENCOUNTER — Ambulatory Visit (INDEPENDENT_AMBULATORY_CARE_PROVIDER_SITE_OTHER): Payer: Medicare Other | Admitting: Internal Medicine

## 2012-07-18 VITALS — BP 130/84 | HR 56 | Temp 98.0°F | Resp 18 | Wt 143.0 lb

## 2012-07-18 DIAGNOSIS — R200 Anesthesia of skin: Secondary | ICD-10-CM

## 2012-07-18 DIAGNOSIS — E039 Hypothyroidism, unspecified: Secondary | ICD-10-CM

## 2012-07-18 DIAGNOSIS — F411 Generalized anxiety disorder: Secondary | ICD-10-CM

## 2012-07-18 DIAGNOSIS — F419 Anxiety disorder, unspecified: Secondary | ICD-10-CM

## 2012-07-18 DIAGNOSIS — R209 Unspecified disturbances of skin sensation: Secondary | ICD-10-CM

## 2012-07-18 NOTE — Progress Notes (Signed)
Subjective:    Patient ID: Haley [name], female    DOB: 26-Apr-1945, 67 y.o.   MRN: [id number]  HPI  67 year old patient who presents today with a chief complaint of persistent numbness and swelling involving her left facial area following cryotherapy performed on June 5.  Past Medical History  Diagnosis Date  . Thyroid disease   . OA (osteoarthritis)   . MVP (mitral valve prolapse)     mild and MR  . Fibromyalgia   . Hemorrhoids     internal  . Paroxysmal a-fib   . Hyperlipidemia   . GERD (gastroesophageal reflux disease)   . Varicosities of leg   . H/O echocardiogram 04/28/08    EF>55% trace mitral regurgitation, No significant valvular pathology  . History of stress test 02/08/2011    Normal Myocardial perfusion study, this is a low risk scan, No prior study available for comparison    History   Social History  . Marital Status: Married    Spouse Name: N/A    Number of Children: N/A  . Years of Education: N/A   Occupational History  . Not on file.   Social History Main Topics  . Smoking status: Current Some Day Smoker    Types: Cigars  . Smokeless tobacco: Never Used     Comment: peach flavor   . Alcohol Use: No  . Drug Use: No  . Sexually Active: Not on file   Other Topics Concern  . Not on file   Social History Narrative  . No narrative on file    Past Surgical History  Procedure Laterality Date  . Abdominal hysterectomy  1989  . Nose surgery  1990    Family History  Problem Relation Age of Onset  . Diabetes Daughter   . Diabetes Daughter   . Diabetes Mother   . Stroke Maternal Grandmother   . Hypertension Mother     Allergies  Allergen Reactions  . Tramadol Hcl     REACTION: jittery    Current Outpatient Prescriptions on File Prior to Visit  Medication Sig Dispense Refill  . alendronate (FOSAMAX) 70 MG tablet Take 70 mg by mouth every 7 (seven) days.       . ALPRAZolam (XANAX) 0.5 MG tablet TAKE ONE TABLET BY MOUTH EVERY DAY AS NEEDED   20 tablet  0  . aspirin 81 MG tablet Take 81 mg by mouth daily.        . Calcium Carbonate (CALTRATE 600) 1500 MG TABS Take by mouth.        . cholecalciferol (VITAMIN D) 1000 UNITS tablet Take 1,000 Units by mouth daily.        . fluticasone (FLONASE) 50 MCG/ACT nasal spray Place 2 sprays into the nose daily.  48 g  3  . levothyroxine (SYNTHROID, LEVOTHROID) 75 MCG tablet Take 1 tablet (75 mcg total) by mouth daily.  90 tablet  0  . metoprolol succinate (TOPROL XL) 25 MG 24 hr tablet Take 1 tablet (25 mg total) by mouth daily.  90 tablet  3  . Multiple Vitamins-Minerals (MULTIVITAMIN WITH MINERALS) tablet Take 1 tablet by mouth daily.        . pantoprazole (PROTONIX) 40 MG tablet Take 1 tablet (40 mg total) by mouth daily.  90 tablet  3  . simvastatin (ZOCOR) 20 MG tablet Take 1 tablet (20 mg total) by mouth at bedtime.  90 tablet  3  . tretinoin (RETIN-A) 0.025 % cream       .  vitamin C (ASCORBIC ACID) 500 MG tablet Take 500 mg by mouth daily.         No current facility-administered medications on file prior to visit.    BP 130/84  Pulse 56  Temp(Src) 98 F (36.7 C) (Oral)  Resp 18  Wt 143 lb (64.864 kg)  BMI 26.15 kg/m2  SpO2 98%       Review of Systems  Neurological: Positive for numbness.       Objective:   Physical Exam  Constitutional: She appears well-developed and well-nourished. No distress.  HENT:  Nose and throat unremarkable No facial swelling appreciated   Neurological:  Subjective decrease sensation by monofilament testing in the high left malar area; The  left lower facial area revealed normal sensation          Assessment & Plan:   Subjective numbness left facial area following cryotherapy. The patient was reassured. She was told that there was no obvious left facial swelling History of anxiety disorder  Samples of simvastatin and Synthroid dispense Followup PCP when necessary

## 2012-07-18 NOTE — Patient Instructions (Signed)
Call or return to clinic prn if these symptoms worsen or fail to improve as anticipated.

## 2012-07-20 ENCOUNTER — Other Ambulatory Visit: Payer: Self-pay | Admitting: *Deleted

## 2012-07-20 MED ORDER — ALPRAZOLAM 0.5 MG PO TABS: ORAL_TABLET | ORAL | Status: DC

## 2012-08-06 ENCOUNTER — Ambulatory Visit (INDEPENDENT_AMBULATORY_CARE_PROVIDER_SITE_OTHER): Payer: Medicare Other | Admitting: Internal Medicine

## 2012-08-06 ENCOUNTER — Encounter: Payer: Self-pay | Admitting: Internal Medicine

## 2012-08-06 VITALS — BP 130/74 | HR 60 | Temp 98.1°F | Resp 20 | Ht 61.5 in | Wt 141.0 lb

## 2012-08-06 DIAGNOSIS — K219 Gastro-esophageal reflux disease without esophagitis: Secondary | ICD-10-CM

## 2012-08-06 DIAGNOSIS — F17200 Nicotine dependence, unspecified, uncomplicated: Secondary | ICD-10-CM | POA: Insufficient documentation

## 2012-08-06 DIAGNOSIS — F419 Anxiety disorder, unspecified: Secondary | ICD-10-CM

## 2012-08-06 DIAGNOSIS — IMO0001 Reserved for inherently not codable concepts without codable children: Secondary | ICD-10-CM

## 2012-08-06 DIAGNOSIS — R7302 Impaired glucose tolerance (oral): Secondary | ICD-10-CM

## 2012-08-06 DIAGNOSIS — R7309 Other abnormal glucose: Secondary | ICD-10-CM

## 2012-08-06 DIAGNOSIS — I7300 Raynaud's syndrome without gangrene: Secondary | ICD-10-CM

## 2012-08-06 DIAGNOSIS — F411 Generalized anxiety disorder: Secondary | ICD-10-CM

## 2012-08-06 DIAGNOSIS — E039 Hypothyroidism, unspecified: Secondary | ICD-10-CM

## 2012-08-06 DIAGNOSIS — Z0000 Encounter for general adult medical examination without abnormal findings: Secondary | ICD-10-CM

## 2012-08-06 DIAGNOSIS — E785 Hyperlipidemia, unspecified: Secondary | ICD-10-CM

## 2012-08-06 DIAGNOSIS — M899 Disorder of bone, unspecified: Secondary | ICD-10-CM

## 2012-08-06 LAB — HEMOGLOBIN A1C: Hgb A1c MFr Bld: 6 % (ref 4.6–6.5)

## 2012-08-06 LAB — TSH: TSH: 0.58 u[IU]/mL (ref 0.35–5.50)

## 2012-08-06 MED ORDER — ALPRAZOLAM 0.5 MG PO TABS: ORAL_TABLET | ORAL | Status: DC

## 2012-08-06 MED ORDER — ALENDRONATE SODIUM 70 MG PO TABS: 70.0000 mg | ORAL_TABLET | ORAL | Status: DC

## 2012-08-06 MED ORDER — LEVOTHYROXINE SODIUM 75 MCG PO TABS: 75.0000 ug | ORAL_TABLET | Freq: Every day | ORAL | Status: DC

## 2012-08-06 NOTE — Patient Instructions (Addendum)
Orthopedic followup  Limit your sodium (Salt) intake  Schedule your colonoscopy to help detect colon cancer.  Smoking tobacco is very bad for your health. You should stop smoking immediately.

## 2012-08-06 NOTE — Progress Notes (Signed)
Subjective:    Patient ID: Haley [name], female    DOB: [birth year], 67 y.o.   MRN: [id number]  HPI  67 year old patient who is seen today for a preventive health examination. She is followed by cardiology and OB/GYN. In 2013 she underwent a nuclear stress test that was normal. She's had a prior partial hysterectomy do to fibroids. Her chief complaint today is right ankle pain. She has seen both orthopedics and podiatry in the past.  Family history father died at 81 details unclear Mother died at 61 complications of coronary artery disease. History of late onset diabetes 4 brothers 2 sisters. One brother died at age 21 of an MI  Past Medical History  Diagnosis Date  . Thyroid disease   . OA (osteoarthritis)   . MVP (mitral valve prolapse)     mild and MR  . Fibromyalgia   . Hemorrhoids     internal  . Paroxysmal a-fib   . Hyperlipidemia   . GERD (gastroesophageal reflux disease)   . Varicosities of leg   . H/O echocardiogram 04/28/08    EF>55% trace mitral regurgitation, No significant valvular pathology  . History of stress test 02/08/2011    Normal Myocardial perfusion study, this is a low risk scan, No prior study available for comparison    History   Social History  . Marital Status: Married    Spouse Name: N/A    Number of Children: N/A  . Years of Education: N/A   Occupational History  . Not on file.   Social History Main Topics  . Smoking status: Current Some Day Smoker    Types: Cigars  . Smokeless tobacco: Never Used     Comment: peach flavor   . Alcohol Use: No  . Drug Use: No  . Sexually Active: Not on file   Other Topics Concern  . Not on file   Social History Narrative  . No narrative on file    Past Surgical History  Procedure Laterality Date  . Abdominal hysterectomy  1989  . Nose surgery  1990    Family History  Problem Relation Age of Onset  . Diabetes Daughter   . Diabetes Daughter   . Diabetes Mother   . Stroke Maternal  Grandmother   . Hypertension Mother     Allergies  Allergen Reactions  . Tramadol Hcl     REACTION: jittery    Current Outpatient Prescriptions on File Prior to Visit  Medication Sig Dispense Refill  . aspirin 81 MG tablet Take 81 mg by mouth daily.        . Calcium Carbonate (CALTRATE 600) 1500 MG TABS Take by mouth.        . cholecalciferol (VITAMIN D) 1000 UNITS tablet Take 1,000 Units by mouth daily.        . fluticasone (FLONASE) 50 MCG/ACT nasal spray Place 2 sprays into the nose daily.  48 g  3  . metoprolol succinate (TOPROL XL) 25 MG 24 hr tablet Take 1 tablet (25 mg total) by mouth daily.  90 tablet  3  . Multiple Vitamins-Minerals (MULTIVITAMIN WITH MINERALS) tablet Take 1 tablet by mouth daily.        . pantoprazole (PROTONIX) 40 MG tablet Take 1 tablet (40 mg total) by mouth daily.  90 tablet  3  . simvastatin (ZOCOR) 20 MG tablet Take 1 tablet (20 mg total) by mouth at bedtime.  90 tablet  3  . vitamin C (ASCORBIC ACID) 500 MG tablet  Take 500 mg by mouth daily.        Marland Kitchen tretinoin (RETIN-A) 0.025 % cream        No current facility-administered medications on file prior to visit.    BP 130/74  Pulse 60  Temp(Src) 98.1 F (36.7 C) (Oral)  Resp 20  Ht 5' 1.5" (1.562 m)  Wt 141 lb (63.957 kg)  BMI 26.21 kg/m2  SpO2 98%   1. Risk factors, based on past  M,S,F history-  cardiovascular risk factors include dyslipidemia and ongoing tobacco use  2.  Physical activities: No major activity restrictions although presently complaining of right foot and ankle discomfort  3.  Depression/mood: History of anxiety disorder but no major depression medical regimen includes alprazolam  4.  Hearing: Complains a mild hearing deficit  5.  ADL's: Independent in all aspects of daily living  6.  Fall risk: Low  7.  Home safety: No problems identified  8.  Height weight, and visual acuity; height and weight stable no change in visual acuity. Wears contacts. Does have annual eye  examination  9.  Counseling: Follow cardiology and GYN  10. Lab orders based on risk factors: Patient may have a history of impaired glucose tolerance. We'll check a hemoglobin A1c. We'll check a lipid profile  11. Referral : Followup GYN and cardiology. Followup orthopedics  12. Care plan: Continue present regimen. Total smoking cessation encouraged  13. Cognitive assessment: Alert in order with normal affect. No cognitive dysfunction       Review of Systems  Constitutional: Negative for fever, appetite change, fatigue and unexpected weight change.  HENT: Negative for hearing loss, ear pain, nosebleeds, congestion, sore throat, mouth sores, trouble swallowing, neck stiffness, dental problem, voice change, sinus pressure and tinnitus.   Eyes: Negative for photophobia, pain, redness and visual disturbance.  Respiratory: Negative for cough, chest tightness and shortness of breath.   Cardiovascular: Negative for chest pain, palpitations and leg swelling.  Gastrointestinal: Negative for nausea, vomiting, abdominal pain, diarrhea, constipation, blood in stool, abdominal distention and rectal pain.  Genitourinary: Negative for dysuria, urgency, frequency, hematuria, flank pain, vaginal bleeding, vaginal discharge, difficulty urinating, genital sores, vaginal pain, menstrual problem and pelvic pain.  Musculoskeletal: Positive for gait problem. Negative for back pain and arthralgias.       Right ankle pain of 2 years duration  Skin: Negative for rash.  Neurological: Negative for dizziness, syncope, speech difficulty, weakness, light-headedness, numbness and headaches.  Hematological: Negative for adenopathy. Does not bruise/bleed easily.  Psychiatric/Behavioral: Negative for suicidal ideas, behavioral problems, self-injury, dysphoric mood and agitation. The patient is not nervous/anxious.        Objective:   Physical Exam  Constitutional: She is oriented to person, place, and time. She  appears well-developed and well-nourished.  Blood pressure 140/70  HENT:  Head: Normocephalic and atraumatic.  Right Ear: External ear normal.  Left Ear: External ear normal.  Mouth/Throat: Oropharynx is clear and moist.  Eyes: Conjunctivae and EOM are normal.  Neck: Normal range of motion. Neck supple. No JVD present. No thyromegaly present.  Cardiovascular: Normal rate, regular rhythm, normal heart sounds and intact distal pulses.   No murmur heard. Decreased left dorsalis pedis pulse  Pulmonary/Chest: Effort normal and breath sounds normal. She has no wheezes. She has no rales.  Breast examination not performed  Abdominal: Soft. Bowel sounds are normal. She exhibits no distension and no mass. There is no tenderness. There is no rebound and no guarding.  Musculoskeletal: Normal range  of motion. She exhibits no edema and no tenderness.  Neurological: She is alert and oriented to person, place, and time. She has normal reflexes. No cranial nerve deficit. She exhibits normal muscle tone. Coordination normal.  Skin: Skin is warm and dry. No rash noted.  Psychiatric: She has a normal mood and affect. Her behavior is normal.          Assessment & Plan:   Preventive health examination Chronic right foot pain. Orthopedic referral Tobacco abuse. Total smoking cessation encouraged Dyslipidemia will check a lipid profile History of impaired glucose tolerance. We'll check a hemoglobin A1c Anxiety disorder  Recheck 6 months

## 2012-08-07 ENCOUNTER — Encounter: Payer: Self-pay | Admitting: Gastroenterology

## 2012-08-28 ENCOUNTER — Telehealth: Payer: Self-pay | Admitting: Cardiovascular Disease

## 2012-08-28 NOTE — Telephone Encounter (Signed)
Returned call.  Pt stated she is filling out a medical form for a colonoscopy.  Wanted to know if she has a heart disease.  Pt informed she has HTN, High cholesterol and Palpitations.  Pt informed she can list them as heart problems.  Pt also wanted to know her cholesterol results, which RN reviewed.  RN discussed diet and exercise w/ pt per request.  Pt verbalized understanding and agreed w/ plan.

## 2012-08-28 NOTE — Telephone Encounter (Signed)
Please call-need to ask you some questions about her heart-she is filling out a paper to have an endo.

## 2012-09-10 ENCOUNTER — Other Ambulatory Visit: Payer: Self-pay | Admitting: Family Medicine

## 2012-09-14 ENCOUNTER — Encounter: Payer: Self-pay | Admitting: Cardiovascular Disease

## 2012-09-14 ENCOUNTER — Ambulatory Visit (INDEPENDENT_AMBULATORY_CARE_PROVIDER_SITE_OTHER): Payer: Medicare Other | Admitting: Cardiovascular Disease

## 2012-09-14 VITALS — BP 126/60 | Ht 65.0 in | Wt 140.4 lb

## 2012-09-14 DIAGNOSIS — IMO0001 Reserved for inherently not codable concepts without codable children: Secondary | ICD-10-CM

## 2012-09-14 DIAGNOSIS — E785 Hyperlipidemia, unspecified: Secondary | ICD-10-CM

## 2012-09-14 DIAGNOSIS — R002 Palpitations: Secondary | ICD-10-CM

## 2012-09-14 NOTE — Progress Notes (Signed)
Patient ID: TEMEKIA CASKEY, female   DOB: 11-27-1945, 67 y.o.   MRN: [id number]    HPI: VIRTIE BUNGERT, is a 67 y.o. female presents to the office today for followup evaluation. I last saw her approximately 4 months ago.  Ms. Mokry  has a history of palpitations with paroxysmal atrial fibrillation, hyperlipidemia, GERD, and hypothyroidism. The past she has been found to have an atherogenic dyslipidemia lipid panel. She also has a history of lower extremity varicosities he does wear support stockings. When I last saw her, she was having difficulty remembering to take her metoprolol on a twice a day regimen at that time I switched her to metoprolol succinate Toprol-XL 25 mg daily. She has felt markedly improved with reference to taking this once a day preparation. She is unaware of any breakthrough tachycardia palpitations. She denies presyncope or syncope. She  recently had an NMR lipoprofile which showed marked improvement with a cholesterol of 174, triglycerides 119, HDL C. cholesterol 60, and LDL C. cholesterol at 90. Her LDL particle number was 1020 and her HDL particle number was 39.5. Her insulin resistance score was 39 which was normal. She presents for followup evaluation  Past Medical History  Diagnosis Date  . Thyroid disease   . OA (osteoarthritis)   . MVP (mitral valve prolapse)     mild and MR  . Fibromyalgia   . Hemorrhoids     internal  . Paroxysmal a-fib   . Hyperlipidemia   . GERD (gastroesophageal reflux disease)   . Varicosities of leg   . H/O echocardiogram 04/28/08    EF>55% trace mitral regurgitation, No significant valvular pathology  . History of stress test 02/08/2011    Normal Myocardial perfusion study, this is a low risk scan, No prior study available for comparison    Past Surgical History  Procedure Laterality Date  . Abdominal hysterectomy  1989  . Nose surgery  1990    Allergies  Allergen Reactions  . Tramadol Hcl     REACTION: jittery    Current  Outpatient Prescriptions  Medication Sig Dispense Refill  . alendronate (FOSAMAX) 70 MG tablet Take 1 tablet (70 mg total) by mouth every 7 (seven) days.  12 tablet  3  . ALPRAZolam (XANAX) 0.5 MG tablet TAKE ONE TABLET BY MOUTH EVERY DAY AS NEEDED  30 tablet  2  . aspirin 81 MG tablet Take 81 mg by mouth daily.        . Calcium Carbonate (CALTRATE 600) 1500 MG TABS Take by mouth.        . cholecalciferol (VITAMIN D) 1000 UNITS tablet Take 1,000 Units by mouth daily.        . fluticasone (CUTIVATE) 0.05 % cream       . fluticasone (FLONASE) 50 MCG/ACT nasal spray Place 2 sprays into the nose daily.  48 g  3  . levothyroxine (SYNTHROID, LEVOTHROID) 75 MCG tablet Take 1 tablet (75 mcg total) by mouth daily.  90 tablet  1  . metoprolol succinate (TOPROL XL) 25 MG 24 hr tablet Take 1 tablet (25 mg total) by mouth daily.  90 tablet  3  . Multiple Vitamins-Minerals (MULTIVITAMIN WITH MINERALS) tablet Take 1 tablet by mouth daily.        . pantoprazole (PROTONIX) 40 MG tablet Take 1 tablet (40 mg total) by mouth daily.  90 tablet  3  . simvastatin (ZOCOR) 20 MG tablet Take 1 tablet (20 mg total) by mouth at bedtime.  90  tablet  3  . sulfamethoxazole-trimethoprim (BACTRIM DS) 800-160 MG per tablet TAKE ONE TABLET BY MOUTH TWICE DAILY AS NEEDED  60 tablet  0  . tretinoin (RETIN-A) 0.025 % cream       . vitamin C (ASCORBIC ACID) 500 MG tablet Take 500 mg by mouth daily.         No current facility-administered medications for this visit.    Socially she is married and has 2 children and 4 grandchildren with one great grandchild. She smokes little cigars. [facility]  ROS is negative for fever chills night sweats. She does get anxious at times and does note palpitations. She oftentimes forgets to take her evening dose of metoprolol. She denies paresthesias. She denies cough. She denies presyncope or syncope she does have varicose veins and does note  some swelling particularly near her right ankle. In the past, she had been evaluated at a vein clinic. She denies chest pressure. She denies wheezing. He denies bleeding. Other system review is negative.  PE BP 126/60  Ht 5\' 5"  (1.651 m)  Wt 140 lb 6.4 oz (63.685 kg)  BMI 23.36 kg/m2  General: Alert, oriented, no distress.  HEENT: Normocephalic, atraumatic. Pupils round and reactive; sclera anicteric;  Nose without nasal septal hypertrophy Mouth/Parynx benign; Mallinpatti scale 2 Neck: No JVD, no carotid briuts Lungs: clear to ausculatation and percussion; no wheezing or rales Heart: RRR, s1 s2 normal 1/6 SEM Abdomen: soft, nontender; no hepatosplenomehaly, BS+; abdominal aorta nontender and not dilated by palpation. Pulses 2+ Extremities: Her right ankle was wrapped in an Ace bandage; no clubbinbg cyanosis or edema, Homan's sign negative  Neurologic: grossly nonfocal  ECG: Sinus rhythm at 53 beats per minute. She has poor anterior R-wave progression. Normal intervals  LABS:  BMET    Component Value Date/Time   NA 143 12/16/2010 0921   K 4.5 12/16/2010 0921   CL 109 12/16/2010 0921   CO2 29 12/16/2010 0921   GLUCOSE 107* 12/16/2010 0921   BUN 15 12/16/2010 0921   CREATININE 0.8 12/16/2010 0921   CALCIUM 9.0 12/16/2010 0921   GFRNONAA 97.08 09/10/2009 0918   GFRAA 93 08/08/2007 1222     Hepatic Function Panel     Component Value Date/Time   PROT 6.6 12/16/2010 0921   ALBUMIN 3.7 12/16/2010 0921   AST 16 12/16/2010 0921   ALT 16 12/16/2010 0921   ALKPHOS 69 12/16/2010 0921   BILITOT 0.3 12/16/2010 0921   BILIDIR 0.0 12/16/2010 0921     CBC    Component Value Date/Time   WBC 7.6 12/16/2010 0921   RBC 4.32 12/16/2010 0921   HGB 13.5 12/16/2010 0921   HCT 39.9 12/16/2010 0921   PLT 197.0 12/16/2010 0921   MCV 92.4 12/16/2010 0921   MCHC 33.8 12/16/2010 0921   RDW 13.9 12/16/2010 0921   LYMPHSABS 2.1 12/16/2010 0921   MONOABS 0.5 12/16/2010 0921   EOSABS 0.9* 12/16/2010 0921   BASOSABS  0.1 12/16/2010 0921     BNP No results found for this basename: probnp    Lipid Panel     Component Value Date/Time   CHOL 169 08/06/2012 1042   TRIG 144.0 08/06/2012 1042   HDL 53.20 08/06/2012 1042   CHOLHDL 3 08/06/2012 1042   VLDL 28.8 08/06/2012 1042   LDLCALC 87 08/06/2012 1042     RADIOLOGY: No results found.    ASSESSMENT AND PLAN:  Ms. Koslowski is maintaining sinus  rhythm. Her blood pressure is well-controlled. She is tolerating Toprol-XL 25 mg much better than previous metoprolol tartrate. Her most recent lipid status does show significant improvement. She seems to have less anxiety as she has had in the past. From a cardiovascular standpoint, she is remaining stable. I will see her in 6 months for cardiology reevaluation or sooner if problems arise.   Lennette Bihari, MD, [facility]  09/14/2012 2:20 PM

## 2012-09-14 NOTE — Patient Instructions (Signed)
Your physician recommends that you schedule a follow-up appointment in: 6 MONTHS. No changes were made today. 

## 2012-09-17 ENCOUNTER — Encounter: Payer: Medicare Other | Admitting: Internal Medicine

## 2012-09-24 ENCOUNTER — Other Ambulatory Visit: Payer: Self-pay | Admitting: Gastroenterology

## 2012-10-04 ENCOUNTER — Ambulatory Visit (AMBULATORY_SURGERY_CENTER): Payer: Self-pay | Admitting: *Deleted

## 2012-10-04 VITALS — Ht 61.5 in | Wt 141.4 lb

## 2012-10-04 DIAGNOSIS — Z1211 Encounter for screening for malignant neoplasm of colon: Secondary | ICD-10-CM

## 2012-10-04 MED ORDER — NA SULFATE-K SULFATE-MG SULF 17.5-3.13-1.6 GM/177ML PO SOLN: 1.0000 | Freq: Once | ORAL | Status: DC

## 2012-10-04 NOTE — Progress Notes (Signed)
No egg or soy allergy. ewm No problems with past sedation. ewm No home 02 use, no cpap. ewm Last colon with Arlyce Dice 11-20-2000 in epic. ewm

## 2012-10-08 ENCOUNTER — Encounter: Payer: Self-pay | Admitting: Gastroenterology

## 2012-10-09 ENCOUNTER — Telehealth: Payer: Self-pay | Admitting: Gastroenterology

## 2012-10-09 NOTE — Telephone Encounter (Signed)
Spoke with Jan M., who looked up pt's information- she states pt was approved for this medication and approval letter will be sent to pt.  No further needs at this time

## 2012-10-17 ENCOUNTER — Telehealth: Payer: Self-pay | Admitting: Gastroenterology

## 2012-10-17 NOTE — Telephone Encounter (Signed)
Called and spoke with pt, pt was questioning metoprolol and protonix, if it was ok to take without food, advised pt that it was okay she could take both medications and to make sure she is drinking plenty of fliuds from the clear liquid list today.-adm

## 2012-10-18 ENCOUNTER — Encounter: Payer: Self-pay | Admitting: Gastroenterology

## 2012-10-18 ENCOUNTER — Ambulatory Visit (AMBULATORY_SURGERY_CENTER): Payer: Medicare Other | Admitting: Gastroenterology

## 2012-10-18 VITALS — BP 128/72 | HR 56 | Temp 97.9°F | Resp 19 | Ht 61.5 in | Wt 141.0 lb

## 2012-10-18 DIAGNOSIS — Z1211 Encounter for screening for malignant neoplasm of colon: Secondary | ICD-10-CM

## 2012-10-18 DIAGNOSIS — K5730 Diverticulosis of large intestine without perforation or abscess without bleeding: Secondary | ICD-10-CM

## 2012-10-18 DIAGNOSIS — K648 Other hemorrhoids: Secondary | ICD-10-CM

## 2012-10-18 MED ORDER — SODIUM CHLORIDE 0.9 % IV SOLN: 500.0000 mL | INTRAVENOUS | Status: DC

## 2012-10-18 NOTE — Progress Notes (Signed)
Report to pacu RN, vss, bbs=clear 

## 2012-10-18 NOTE — Patient Instructions (Signed)
Impressions/recommendations:  Hemorrhoids (handout given) Diverticulosis (handout given)  High Fiber diet (handout given)  YOU HAD AN ENDOSCOPIC PROCEDURE TODAY AT THE [facility]: Refer to the procedure report that was given to you for any specific questions about what was found during the examination.  If the procedure report does not answer your questions, please call your gastroenterologist to clarify.  If you requested that your care partner not be given the details of your procedure findings, then the procedure report has been included in a sealed envelope for you to review at your convenience later.  YOU SHOULD EXPECT: Some feelings of bloating in the abdomen. Passage of more gas than usual.  Walking can help get rid of the air that was put into your GI tract during the procedure and reduce the bloating. If you had a lower endoscopy (such as a colonoscopy or flexible sigmoidoscopy) you may notice spotting of blood in your stool or on the toilet paper. If you underwent a bowel prep for your procedure, then you may not have a normal bowel movement for a few days.  DIET: Your first meal following the procedure should be a light meal and then it is ok to progress to your normal diet.  A half-sandwich or bowl of soup is an example of a good first meal.  Heavy or fried foods are harder to digest and may make you feel nauseous or bloated.  Likewise meals heavy in dairy and vegetables can cause extra gas to form and this can also increase the bloating.  Drink plenty of fluids but you should avoid alcoholic beverages for 24 hours.  ACTIVITY: Your care partner should take you home directly after the procedure.  You should plan to take it easy, moving slowly for the rest of the day.  You can resume normal activity the day after the procedure however you should NOT DRIVE or use heavy machinery for 24 hours (because of the sedation medicines used during the test).    SYMPTOMS TO REPORT  IMMEDIATELY: A gastroenterologist can be reached at any hour.  During normal business hours, 8:30 AM to 5:00 PM Monday through Friday, call [phone].  After hours and on weekends, please call the GI answering service at [phone] who will take a message and have the physician on call contact you.   Following lower endoscopy (colonoscopy or flexible sigmoidoscopy):  Excessive amounts of blood in the stool  Significant tenderness or worsening of abdominal pains  Swelling of the abdomen that is new, acute  Fever of 100F or higher   FOLLOW UP: If any biopsies were taken you will be contacted by phone or by letter within the next 1-3 weeks.  Call your gastroenterologist if you have not heard about the biopsies in 3 weeks.  Our staff will call the home number listed on your records the next business day following your procedure to check on you and address any questions or concerns that you may have at that time regarding the information given to you following your procedure. This is a courtesy call and so if there is no answer at the home number and we have not heard from you through the emergency physician on call, we will assume that you have returned to your regular daily activities without incident.  SIGNATURES/CONFIDENTIALITY: You and/or your care partner have signed paperwork which will be entered into your electronic medical record.  These signatures attest to the fact that that the information above on your After  Visit Summary has been reviewed and is understood.  Full responsibility of the confidentiality of this discharge information lies with you and/or your care-partner. 

## 2012-10-18 NOTE — Op Note (Signed)
[facility] [address] [place], [address]   COLONOSCOPY PROCEDURE REPORT  PATIENT: Haley [name], Haley [name]  MR#: [id number] BIRTHDATE: 04/03/1945 , 67  yrs. old GENDER: Female ENDOSCOPIST: Louis Meckel, MD REFERRED BY: PROCEDURE DATE:  10/18/2012 PROCEDURE:   Colonoscopy, diagnostic First Screening Colonoscopy - Avg.  risk and is 50 yrs.  old or older - No.  Prior Negative Screening - Now for repeat screening. 10 or more years since last screening  History of Adenoma - Now for follow-up colonoscopy & has been > or = to 3 yrs.  N/A  Polyps Removed Today? No.  Recommend repeat exam, <10 yrs? No. ASA CLASS:   Class II INDICATIONS:Average risk patient for colon cancer. MEDICATIONS: MAC sedation, administered by CRNA and propofol (Diprivan) 250mg  IV  DESCRIPTION OF PROCEDURE:   After the risks benefits and alternatives of the procedure were thoroughly explained, informed consent was obtained.  A digital rectal exam revealed no abnormalities of the rectum.   The [id number]  endoscope was introduced through the anus and advanced to the cecum, which was identified by both the appendix and ileocecal valve. No adverse events experienced.   The quality of the prep was excellent using Suprep  The instrument was then slowly withdrawn as the colon was fully examined.      COLON FINDINGS: Mild diverticulosis was noted in the ascending colon.   Internal hemorrhoids were found.   The colon mucosa was otherwise normal.  Retroflexed views revealed no abnormalities. The time to cecum=3 minutes 18 seconds.  Withdrawal time=9 minutes 57 seconds.  The scope was withdrawn and the procedure completed. COMPLICATIONS: There were no complications.  ENDOSCOPIC IMPRESSION: 1.   Mild diverticulosis was noted in the ascending colon 2.   Internal hemorrhoids 3.   The colon mucosa was otherwise normal  RECOMMENDATIONS: Continue current colorectal screening recommendations for  "routine risk" patients with a repeat colonoscopy in 10 years.   eSigned:  Louis Meckel, MD 10/18/2012 9:36 AM   cc: Lindley Magnus, MD and Nedra Hai MD   PATIENT NAME:  Alfonso, Shackett MR#: [id number]

## 2012-10-18 NOTE — Progress Notes (Signed)
Patient did not experience any of the following events: a burn prior to discharge; a fall within the facility; wrong site/side/patient/procedure/implant event; or a hospital transfer or hospital admission upon discharge from the facility. (G8907) Patient did not have preoperative order for IV antibiotic SSI prophylaxis. (G8918)  

## 2012-10-19 ENCOUNTER — Telehealth: Payer: Self-pay | Admitting: *Deleted

## 2012-10-19 NOTE — Telephone Encounter (Signed)
  Follow up Call-  Call back number 10/18/2012  Post procedure Call Back phone  # [phone]  Permission to leave phone message Yes     Patient questions:  Do you have a fever, pain , or abdominal swelling? no Pain Score  0 *  Have you tolerated food without any problems? yes  Have you been able to return to your normal activities? yes  Do you have any questions about your discharge instructions: Diet   no Medications  no Follow up visit  no  Do you have questions or concerns about your Care? no  Actions: * If pain score is 4 or above: No action needed, pain <4.

## 2012-11-26 ENCOUNTER — Other Ambulatory Visit: Payer: Self-pay | Admitting: Internal Medicine

## 2012-11-26 DIAGNOSIS — Z1231 Encounter for screening mammogram for malignant neoplasm of breast: Secondary | ICD-10-CM

## 2012-12-21 ENCOUNTER — Ambulatory Visit (HOSPITAL_COMMUNITY)
Admission: RE | Admit: 2012-12-21 | Discharge: 2012-12-21 | Disposition: A | Discharge status: Home / Self Care | Payer: Medicare Other | Source: Ambulatory Visit | Attending: Internal Medicine | Admitting: Internal Medicine

## 2012-12-21 DIAGNOSIS — Z1231 Encounter for screening mammogram for malignant neoplasm of breast: Secondary | ICD-10-CM | POA: Insufficient documentation

## 2012-12-26 ENCOUNTER — Other Ambulatory Visit: Payer: Self-pay | Admitting: *Deleted

## 2012-12-26 MED ORDER — LEVOTHYROXINE SODIUM 75 MCG PO TABS: 75.0000 ug | ORAL_TABLET | Freq: Every day | ORAL | Status: DC

## 2013-01-11 ENCOUNTER — Other Ambulatory Visit: Payer: Self-pay | Admitting: *Deleted

## 2013-01-11 MED ORDER — PANTOPRAZOLE SODIUM 40 MG PO TBEC: 40.0000 mg | DELAYED_RELEASE_TABLET | Freq: Every day | ORAL | Status: DC

## 2013-01-16 ENCOUNTER — Telehealth: Payer: Self-pay | Admitting: Internal Medicine

## 2013-01-16 MED ORDER — LEVOTHYROXINE SODIUM 75 MCG PO TABS: 75.0000 ug | ORAL_TABLET | Freq: Every day | ORAL | Status: DC

## 2013-01-16 NOTE — Telephone Encounter (Signed)
[facility] requesting refill of levothyroxine (SYNTHROID, LEVOTHROID) 75 MCG tablet

## 2013-03-13 ENCOUNTER — Other Ambulatory Visit: Payer: Self-pay | Admitting: Internal Medicine

## 2013-03-19 ENCOUNTER — Ambulatory Visit (INDEPENDENT_AMBULATORY_CARE_PROVIDER_SITE_OTHER): Payer: Medicare Other | Admitting: Cardiovascular Disease

## 2013-03-19 ENCOUNTER — Encounter: Payer: Self-pay | Admitting: Cardiovascular Disease

## 2013-03-19 VITALS — BP 120/60 | HR 51 | Ht 62.0 in | Wt 140.1 lb

## 2013-03-19 DIAGNOSIS — I480 Paroxysmal atrial fibrillation: Secondary | ICD-10-CM

## 2013-03-19 DIAGNOSIS — I4891 Unspecified atrial fibrillation: Secondary | ICD-10-CM

## 2013-03-19 NOTE — Patient Instructions (Signed)
Your physician recommends that you schedule a follow-up appointment in: 6 months. No changes were made today in your therapy. 

## 2013-03-25 ENCOUNTER — Other Ambulatory Visit: Payer: Self-pay | Admitting: *Deleted

## 2013-03-25 MED ORDER — METOPROLOL SUCCINATE ER 25 MG PO TB24: 25.0000 mg | ORAL_TABLET | Freq: Every day | ORAL | Status: DC

## 2013-03-25 NOTE — Telephone Encounter (Signed)
Rx was sent to pharmacy electronically. 

## 2013-03-31 ENCOUNTER — Encounter: Payer: Self-pay | Admitting: Cardiovascular Disease

## 2013-03-31 DIAGNOSIS — I480 Paroxysmal atrial fibrillation: Secondary | ICD-10-CM | POA: Insufficient documentation

## 2013-03-31 NOTE — Progress Notes (Signed)
Patient ID: Haley [name], female   DOB: 11-28-1945, 68 y.o.   MRN: [id number]     HPI: Haley [name] is a 68 y.o. female presents to the office today for a 6 month followup evaluation.  Ms. Sugarman  has a history of palpitations with paroxysmal atrial fibrillation, hyperlipidemia, GERD, and hypothyroidism. She has been found to have an atherogenic dyslipidemia lipid panel. She also has a history of lower extremity varicosities and wears support stockings. When I  saw her in the past, she was having difficulty remembering to take her metoprolol on a twice a day regimen at that time I switched her to metoprolol succinate Toprol-XL 25 mg daily. She has felt markedly improved with reference to taking this once a day preparation. She is unaware of any breakthrough tachycardia palpitations. She denies presyncope or syncope. An NMR lipoprofile  showed marked improvement with a cholesterol of 174, triglycerides 119, HDL C. cholesterol 60, and LDL cholesterol at 90. Her LDL particle number was 1020 and her HDL particle number was 39.5. Her insulin resistance score was 39 which was normal.   The past months, she has and more energetic. She does note some musculoskeletal discomfort intermittently. She denies any myalgias. She denies arthralgias. She denies palpitations. She denies any episodes of chest pressure.  Past Medical History  Diagnosis Date  . Thyroid disease   . OA (osteoarthritis)   . MVP (mitral valve prolapse)     mild and MR  . Fibromyalgia   . Hemorrhoids     internal  . Paroxysmal a-fib   . Hyperlipidemia   . GERD (gastroesophageal reflux disease)   . Varicosities of leg   . H/O echocardiogram 04/28/08    EF>55% trace mitral regurgitation, No significant valvular pathology  . History of stress test 02/08/2011    Normal Myocardial perfusion study, this is a low risk scan, No prior study available for comparison    Past Surgical History  Procedure Laterality Date  . Abdominal  hysterectomy  1989  . Nose surgery  1990  . Colonoscopy  11-20-2000  . Appendectomy  1989    with hysterectomy    Allergies  Allergen Reactions  . Tramadol Hcl     REACTION: jittery    Current Outpatient Prescriptions  Medication Sig Dispense Refill  . alendronate (FOSAMAX) 70 MG tablet Take 1 tablet (70 mg total) by mouth every 7 (seven) days.  12 tablet  3  . ALPRAZolam (XANAX) 0.5 MG tablet TAKE ONE TABLET BY MOUTH ONCE DAILY AS NEEDED  30 tablet  2  . aspirin 81 MG tablet Take 81 mg by mouth daily.        . Calcium Carbonate (CALTRATE 600) 1500 MG TABS Take by mouth.        . cholecalciferol (VITAMIN D) 1000 UNITS tablet Take 1,000 Units by mouth daily.       . fluticasone (CUTIVATE) 0.05 % cream Apply 1 application topically 2 (two) times daily.       . fluticasone (FLONASE) 50 MCG/ACT nasal spray Place 2 sprays into the nose daily.  48 g  3  . levothyroxine (SYNTHROID, LEVOTHROID) 75 MCG tablet Take 1 tablet (75 mcg total) by mouth daily.  90 tablet  1  . Multiple Vitamins-Minerals (MULTIVITAMIN WITH MINERALS) tablet Take 1 tablet by mouth daily.        . Omega-3 Fatty Acids (FISH OIL) 1000 MG CAPS Take 1 capsule by mouth daily.      . pantoprazole (  PROTONIX) 40 MG tablet Take 1 tablet (40 mg total) by mouth daily.  90 tablet  3  . simvastatin (ZOCOR) 20 MG tablet Take 1 tablet (20 mg total) by mouth at bedtime.  90 tablet  3  . sulfamethoxazole-trimethoprim (BACTRIM DS) 800-160 MG per tablet TAKE ONE TABLET BY MOUTH TWICE DAILY AS NEEDED  60 tablet  0  . tretinoin (RETIN-A) 0.025 % cream Apply topically at bedtime.       . vitamin C (ASCORBIC ACID) 500 MG tablet Take 500 mg by mouth daily.        . metoprolol succinate (TOPROL XL) 25 MG 24 hr tablet Take 1 tablet (25 mg total) by mouth daily.  90 tablet  3   No current facility-administered medications for this visit.    Socially she is married and has 2 children and 4 grandchildren with one great grandchild. She smokes  little cigars. [facility].  ROS is negative for fever chills night sweats. He denies visual changes. There are no changes to his hearing. There is no lymphadenopathy. There is no wheezing or increased sputum production. She does get anxious at times and does note palpitations.  She denies paresthesias. She denies cough. She denies presyncope or syncope.  She denies abdominal pain. She denies nausea vomiting or diarrhea. She denies blood in stool or urine. She does have varicose veins and does note some swelling particularly near her right ankle. In the past, she had been evaluated at a vein clinic. She denies bleeding. She does have hypothyroidism on Synthroid replacement. There is no diabetes. She denies difficulty with sleep. Other comprehensive 14 point system review is negative.  PE BP 120/60  Pulse 51  Ht 5\' 2"  (1.575 m)  Wt 140 lb 1.6 oz (63.549 kg)  BMI 25.62 kg/m2  General: Alert, oriented, no distress.  HEENT: Normocephalic, atraumatic. Pupils round and reactive; sclera anicteric; no xanthelasmas Nose without nasal septal hypertrophy Mouth/Parynx benign; Mallinpatti scale 2 Neck: No JVD, no carotid bruits with normal carotid upstroke Chest wall: Nontender to palpation Lungs: clear to ausculatation and percussion; no wheezing or rales Heart: RRR, s1 s2 normal 1/6 SEM, no diastolic murmur. No S3 or S4 gallop. No rubs thrills or heaves. Abdomen: soft, nontender; no hepatosplenomehaly, BS+; abdominal aorta nontender and not dilated by palpation. Back: No CVA tenderness Pulses 2+ Extremities: Her right ankle was wrapped in an Ace bandage; no clubbinbg cyanosis or edema, Homan's sign negative  Neurologic: grossly nonfocal Psychological: Normal affect and mood  ECG (independently read by me): Sinus bradycardia 51 beats per minute. No ectopy. Normal intervals.  Prior 09/14/2012 ECG: Sinus rhythm at 53 beats per minute. She has poor  anterior R-wave progression. Normal intervals  LABS:  BMET    Component Value Date/Time   NA 143 12/16/2010 0921   K 4.5 12/16/2010 0921   CL 109 12/16/2010 0921   CO2 29 12/16/2010 0921   GLUCOSE 107* 12/16/2010 0921   BUN 15 12/16/2010 0921   CREATININE 0.8 12/16/2010 0921   CALCIUM 9.0 12/16/2010 0921   GFRNONAA 97.08 09/10/2009 0918   GFRAA 93 08/08/2007 1222     Hepatic Function Panel     Component Value Date/Time   PROT 6.6 12/16/2010 0921   ALBUMIN 3.7 12/16/2010 0921   AST 16 12/16/2010 0921   ALT 16 12/16/2010 0921   ALKPHOS 69 12/16/2010 0921   BILITOT 0.3 12/16/2010 0921   BILIDIR 0.0 12/16/2010 0102  CBC    Component Value Date/Time   WBC 7.6 12/16/2010 0921   RBC 4.32 12/16/2010 0921   HGB 13.5 12/16/2010 0921   HCT 39.9 12/16/2010 0921   PLT 197.0 12/16/2010 0921   MCV 92.4 12/16/2010 0921   MCHC 33.8 12/16/2010 0921   RDW 13.9 12/16/2010 0921   LYMPHSABS 2.1 12/16/2010 0921   MONOABS 0.5 12/16/2010 0921   EOSABS 0.9* 12/16/2010 0921   BASOSABS 0.1 12/16/2010 0921     BNP No results found for this basename: probnp    Lipid Panel     Component Value Date/Time   CHOL 169 08/06/2012 1042   TRIG 144.0 08/06/2012 1042   HDL 53.20 08/06/2012 1042   CHOLHDL 3 08/06/2012 1042   VLDL 28.8 08/06/2012 1042   LDLCALC 87 08/06/2012 1042     RADIOLOGY: No results found.    ASSESSMENT AND PLAN: Ms. Ballard is maintaining sinus rhythm. Her blood pressure is well-controlled. She is tolerating Toprol-XL 25 mg much better than previous metoprolol tartrate and although she is bradycardic, she is asymptomatic with reference to this. She denies presyncope or syncope. Her prior echocardiogram has demonstrated normal systolic and diastolic function. She had very mild aortic sclerosis. A nuclear perfusion study done in January 2013 showed normal perfusion without scar or ischemia and she had a post stress ejection fraction of 75%.  Her most recent lipid status does show significant  improvement. She seems to have less anxiety as she has had in the past. From a cardiovascular standpoint, she is remaining stable. She is tolerating simvastatin for hyperlipidemia with LDL level at 87, HDL level of 53 and total cholesterol at 169. Her body mass index is excellent at 25.6. She is more energetic. As long as she remains stable I will see her in 6 months for cardiology reevaluation or sooner if problems arise.   Troy Sine, MD, [facility]  03/31/2013 3:35 PM

## 2013-05-06 ENCOUNTER — Other Ambulatory Visit: Payer: Self-pay

## 2013-05-06 MED ORDER — SIMVASTATIN 20 MG PO TABS: 20.0000 mg | ORAL_TABLET | Freq: Every day | ORAL | Status: DC

## 2013-05-06 NOTE — Telephone Encounter (Signed)
Rx was sent to pharmacy electronically. 

## 2013-05-23 ENCOUNTER — Encounter (HOSPITAL_COMMUNITY): Payer: Self-pay | Admitting: Dietician

## 2013-05-23 NOTE — Progress Notes (Signed)
[facility] Diabetes Class Completion  Date:May 23, 2013  Time: 1730  Pt attended [facility]'s Diabetes Group Education Class on May 23, 2013.   Patient was educated on the following topics:   -Survival skills (signs and symptoms of hyperglycemia and hypoglycemia, treatment for hypoglycemia, ideal levels for fasting and postprandial blood sugars, goal Hgb A1c level, foot care basics)  -Recommendations for physical activity   -Carbohydrate metabolism in relation to diabetes   -Meal planning (sources of carbohydrate, carbohydrate counting, meal planning strategies, food label reading, and portion control).  Handouts provided:  -"Diabetes and You: Taking Charge of Your Health"  -"Carbohydrate Counting and Meal Planning"  -"Your Guide to Better Office Visits"   Zurii Hewes A. Jeidy Hoerner, RD, LDN  

## 2013-06-06 ENCOUNTER — Telehealth: Payer: Self-pay | Admitting: Cardiovascular Disease

## 2013-06-06 NOTE — Telephone Encounter (Signed)
RN spoke to rep Haley [name].   RN stated that a phone call was made earlier concerning patient Metoprolol ER.  Haley [name] stated the patient initiated prior authorization.- - tier 2 exception. Haley [name] transferred called to Haley [name] for a phone Prior Authorization.   Haley [name] ask question- if patient tried a tier 1 medication ,and why it was stopped. RN reviewed patient's chart. Patient had tried metoprolol tart.twice a day. She was change due to non complaint taking one of the of the pills each day. Haley [name] received information- and stated she thinks it will be approved it has to go to the physician  director for verification. A letter will be sent to the office and the patient.

## 2013-06-06 NOTE — Telephone Encounter (Signed)
She have initiated the tier acceptance request for her Metoprolol Extended Release. They need a diagnosis and any other medicine that she have tried and did not work out.

## 2013-08-13 ENCOUNTER — Encounter: Payer: Self-pay | Admitting: Internal Medicine

## 2013-08-13 ENCOUNTER — Ambulatory Visit (INDEPENDENT_AMBULATORY_CARE_PROVIDER_SITE_OTHER): Payer: Medicare Other | Admitting: Internal Medicine

## 2013-08-13 VITALS — BP 120/74 | HR 56 | Temp 97.6°F | Resp 18 | Ht 61.5 in | Wt 134.0 lb

## 2013-08-13 DIAGNOSIS — Z23 Encounter for immunization: Secondary | ICD-10-CM

## 2013-08-13 DIAGNOSIS — E785 Hyperlipidemia, unspecified: Secondary | ICD-10-CM

## 2013-08-13 DIAGNOSIS — Z0000 Encounter for general adult medical examination without abnormal findings: Secondary | ICD-10-CM

## 2013-08-13 DIAGNOSIS — F17200 Nicotine dependence, unspecified, uncomplicated: Secondary | ICD-10-CM

## 2013-08-13 DIAGNOSIS — E039 Hypothyroidism, unspecified: Secondary | ICD-10-CM

## 2013-08-13 DIAGNOSIS — I4891 Unspecified atrial fibrillation: Secondary | ICD-10-CM

## 2013-08-13 DIAGNOSIS — I480 Paroxysmal atrial fibrillation: Secondary | ICD-10-CM

## 2013-08-13 DIAGNOSIS — K219 Gastro-esophageal reflux disease without esophagitis: Secondary | ICD-10-CM

## 2013-08-13 LAB — COMPREHENSIVE METABOLIC PANEL
ALT: 11 U/L (ref 0–35)
AST: 15 U/L (ref 0–37)
Albumin: 3.9 g/dL (ref 3.5–5.2)
Alkaline Phosphatase: 59 U/L (ref 39–117)
BUN: 13 mg/dL (ref 6–23)
CALCIUM: 9.3 mg/dL (ref 8.4–10.5)
CHLORIDE: 105 meq/L (ref 96–112)
CO2: 28 mEq/L (ref 19–32)
CREATININE: 0.8 mg/dL (ref 0.4–1.2)
GFR: 78.9 mL/min (ref >60.00)
Glucose, Bld: 75 mg/dL (ref 70–99)
Potassium: 3.9 mEq/L (ref 3.5–5.1)
Sodium: 141 mEq/L (ref 135–145)
Total Bilirubin: 0.9 mg/dL (ref 0.2–1.2)
Total Protein: 6.8 g/dL (ref 6.0–8.3)

## 2013-08-13 LAB — CBC WITH DIFFERENTIAL/PLATELET
Basophils Absolute: 0 10*3/uL (ref 0.0–0.1)
Basophils Relative: 0.5 % (ref 0.0–3.0)
EOS ABS: 0.8 10*3/uL — AB (ref 0.0–0.7)
Eosinophils Relative: 9.5 % — ABNORMAL HIGH (ref 0.0–5.0)
HCT: 41 % (ref 36.0–46.0)
HEMOGLOBIN: 13.7 g/dL (ref 12.0–15.0)
LYMPHS PCT: 28 % (ref 12.0–46.0)
Lymphs Abs: 2.3 10*3/uL (ref 0.7–4.0)
MCHC: 33.4 g/dL (ref 30.0–36.0)
MCV: 90.5 fl (ref 78.0–100.0)
MONO ABS: 0.6 10*3/uL (ref 0.1–1.0)
Monocytes Relative: 7 % (ref 3.0–12.0)
NEUTROS ABS: 4.6 10*3/uL (ref 1.4–7.7)
Neutrophils Relative %: 55 % (ref 43.0–77.0)
Platelets: 187 10*3/uL (ref 150.0–400.0)
RBC: 4.53 Mil/uL (ref 3.87–5.11)
RDW: 14.3 % (ref 11.5–15.5)
WBC: 8.3 10*3/uL (ref 4.0–10.5)

## 2013-08-13 LAB — LIPID PANEL
Cholesterol: 165 mg/dL (ref 0–200)
HDL: 54.4 mg/dL (ref >39.00)
LDL Cholesterol: 91 mg/dL (ref 0–99)
NONHDL: 110.6
Total CHOL/HDL Ratio: 3
Triglycerides: 100 mg/dL (ref 0.0–149.0)
VLDL: 20 mg/dL (ref 0.0–40.0)

## 2013-08-13 LAB — TSH: TSH: 2.03 u[IU]/mL (ref 0.35–4.50)

## 2013-08-13 MED ORDER — ALPRAZOLAM 0.5 MG PO TABS: ORAL_TABLET | ORAL | Status: DC

## 2013-08-13 NOTE — Progress Notes (Signed)
Subjective:    Patient ID: Haley [name], female    DOB: Apr 02, 1945, 68 y.o.   MRN: [id number]  HPI 23 -year-old patient who is seen today for a preventive health examination.  She is followed by cardiology and OB/GYN. In 2013 she underwent a nuclear stress test that was normal. She's had a prior partial hysterectomy due  to fibroids.  She has seen both orthopedics and podiatry in the past.  Family history father died at 38 details unclear Mother died at 33 complications of coronary artery disease. History of late onset diabetes 4 brothers 2 sisters. One brother died at age 48 of an MI  Past Medical History  Diagnosis Date  . Thyroid disease   . OA (osteoarthritis)   . MVP (mitral valve prolapse)     mild and MR  . Fibromyalgia   . Hemorrhoids     internal  . Paroxysmal a-fib   . Hyperlipidemia   . GERD (gastroesophageal reflux disease)   . Varicosities of leg   . H/O echocardiogram 04/28/08    EF>55% trace mitral regurgitation, No significant valvular pathology  . History of stress test 02/08/2011    Normal Myocardial perfusion study, this is a low risk scan, No prior study available for comparison    History   Social History  . Marital Status: Married    Spouse Name: N/A    Number of Children: N/A  . Years of Education: N/A   Occupational History  . Not on file.   Social History Main Topics  . Smoking status: Current Some Day Smoker    Types: Cigars  . Smokeless tobacco: Never Used     Comment: peach flavor   . Alcohol Use: No  . Drug Use: No  . Sexual Activity: Not on file   Other Topics Concern  . Not on file   Social History Narrative  . No narrative on file    Past Surgical History  Procedure Laterality Date  . Abdominal hysterectomy  1989  . Nose surgery  1990  . Colonoscopy  11-20-2000  . Appendectomy  1989    with hysterectomy    Family History  Problem Relation Age of Onset  . Diabetes Daughter   . Diabetes Mother   . Hypertension  Mother   . Stroke Maternal Grandmother   . Colon cancer Neg Hx     Allergies  Allergen Reactions  . Tramadol Hcl     REACTION: jittery    Current Outpatient Prescriptions on File Prior to Visit  Medication Sig Dispense Refill  . alendronate (FOSAMAX) 70 MG tablet Take 1 tablet (70 mg total) by mouth every 7 (seven) days.  12 tablet  3  . ALPRAZolam (XANAX) 0.5 MG tablet TAKE ONE TABLET BY MOUTH ONCE DAILY AS NEEDED  30 tablet  2  . aspirin 81 MG tablet Take 81 mg by mouth daily.        . Calcium Carbonate (CALTRATE 600) 1500 MG TABS Take by mouth.        . cholecalciferol (VITAMIN D) 1000 UNITS tablet Take 1,000 Units by mouth daily.       . fluticasone (CUTIVATE) 0.05 % cream Apply 1 application topically 2 (two) times daily.       . fluticasone (FLONASE) 50 MCG/ACT nasal spray Place 2 sprays into the nose daily.  48 g  3  . levothyroxine (SYNTHROID, LEVOTHROID) 75 MCG tablet Take 1 tablet (75 mcg total) by mouth daily.  90 tablet  1  . metoprolol succinate (TOPROL XL) 25 MG 24 hr tablet Take 1 tablet (25 mg total) by mouth daily.  90 tablet  3  . Multiple Vitamins-Minerals (MULTIVITAMIN WITH MINERALS) tablet Take 1 tablet by mouth daily.        . Omega-3 Fatty Acids (FISH OIL) 1000 MG CAPS Take 1 capsule by mouth daily.      . pantoprazole (PROTONIX) 40 MG tablet Take 1 tablet (40 mg total) by mouth daily.  90 tablet  3  . simvastatin (ZOCOR) 20 MG tablet Take 1 tablet (20 mg total) by mouth at bedtime.  90 tablet  3  . sulfamethoxazole-trimethoprim (BACTRIM DS) 800-160 MG per tablet TAKE ONE TABLET BY MOUTH TWICE DAILY AS NEEDED  60 tablet  0  . tretinoin (RETIN-A) 0.025 % cream Apply topically at bedtime.       . vitamin C (ASCORBIC ACID) 500 MG tablet Take 500 mg by mouth daily.         No current facility-administered medications on file prior to visit.    There were no vitals taken for this visit.   1. Risk factors, based on past  M,S,F history-  cardiovascular risk factors  include dyslipidemia and ongoing tobacco use  2.  Physical activities: No major activity restrictions although presently complaining of right foot and ankle discomfort  3.  Depression/mood: History of anxiety disorder but no major depression medical regimen includes alprazolam  4.  Hearing: Complains a mild hearing deficit  5.  ADL's: Independent in all aspects of daily living  6.  Fall risk: Low  7.  Home safety: No problems identified  8.  Height weight, and visual acuity; height and weight stable no change in visual acuity. Wears contacts. Does have annual eye examination  9.  Counseling: Follow cardiology and GYN  10. Lab orders based on risk factors: Patient may have a history of impaired glucose tolerance. We'll check a hemoglobin A1c. We'll check a lipid profile  11. Referral : Followup GYN and cardiology. Followup orthopedics  12. Care plan: Continue present regimen. Total smoking cessation encouraged  13. Cognitive assessment: Alert in order with normal affect. No cognitive dysfunction  14.  Preventive services- mammogram, encouraged  15.  Provider.  Update- consultants include OB/GYN.  Cardiology, podiatry and orthopedics       Review of Systems  Constitutional: Negative for fever, appetite change, fatigue and unexpected weight change.  HENT: Negative for congestion, dental problem, ear pain, hearing loss, mouth sores, nosebleeds, sinus pressure, sore throat, tinnitus, trouble swallowing and voice change.   Eyes: Negative for photophobia, pain, redness and visual disturbance.  Respiratory: Negative for cough, chest tightness and shortness of breath.   Cardiovascular: Negative for chest pain, palpitations and leg swelling.  Gastrointestinal: Negative for nausea, vomiting, abdominal pain, diarrhea, constipation, blood in stool, abdominal distention and rectal pain.  Genitourinary: Negative for dysuria, urgency, frequency, hematuria, flank pain, vaginal bleeding,  vaginal discharge, difficulty urinating, genital sores, vaginal pain, menstrual problem and pelvic pain.  Musculoskeletal: Positive for gait problem. Negative for arthralgias, back pain and neck stiffness.       Right ankle pain of 2 years duration  Skin: Negative for rash.  Neurological: Negative for dizziness, syncope, speech difficulty, weakness, light-headedness, numbness and headaches.  Hematological: Negative for adenopathy. Does not bruise/bleed easily.  Psychiatric/Behavioral: Negative for suicidal ideas, behavioral problems, self-injury, dysphoric mood and agitation. The patient is not nervous/anxious.        Objective:   Physical  Exam  Constitutional: She is oriented to person, place, and time. She appears well-developed and well-nourished.  Blood pressure 140/70  HENT:  Head: Normocephalic and atraumatic.  Right Ear: External ear normal.  Left Ear: External ear normal.  Mouth/Throat: Oropharynx is clear and moist.  Eyes: Conjunctivae and EOM are normal.  Neck: Normal range of motion. Neck supple. No JVD present. No thyromegaly present.  Cardiovascular: Normal rate, regular rhythm, normal heart sounds and intact distal pulses.   No murmur heard. Decreased left dorsalis pedis pulse  Pulmonary/Chest: Effort normal and breath sounds normal. She has no wheezes. She has no rales.  Breast examination not performed  Abdominal: Soft. Bowel sounds are normal. She exhibits no distension and no mass. There is no tenderness. There is no rebound and no guarding.  Musculoskeletal: Normal range of motion. She exhibits no edema and no tenderness.  Neurological: She is alert and oriented to person, place, and time. She has normal reflexes. No cranial nerve deficit. She exhibits normal muscle tone. Coordination normal.  Skin: Skin is warm and dry. No rash noted.  Psychiatric: She has a normal mood and affect. Her behavior is normal.          Assessment & Plan:   Preventive health  examination  Tobacco abuse. Total smoking cessation encouraged Dyslipidemia will check a lipid profile Hypothyroidism.  We'll check a TSH Gastroesophageal reflux disease.  Continue PPI therapy  Anxiety disorder  Recheck 6 months

## 2013-08-13 NOTE — Patient Instructions (Signed)
Limit your sodium (Salt) intake  Smoking tobacco is very bad for your health. You should stop smoking immediately.    It is important that you exercise regularly, at least 20 minutes 3 to 4 times per week.  If you develop chest pain or shortness of breath seek  medical attention.  Please check your blood pressure on a regular basis.  If it is consistently greater than 150/90, please make an office appointment.  Take a calcium supplement, plus 800-1200 units of vitamin D  

## 2013-08-16 ENCOUNTER — Telehealth: Payer: Self-pay | Admitting: Internal Medicine

## 2013-08-16 MED ORDER — LEVOTHYROXINE SODIUM 75 MCG PO TABS: 75.0000 ug | ORAL_TABLET | Freq: Every day | ORAL | Status: DC

## 2013-08-16 MED ORDER — ALENDRONATE SODIUM 70 MG PO TABS: 70.0000 mg | ORAL_TABLET | ORAL | Status: DC

## 2013-08-16 MED ORDER — FLUTICASONE PROPIONATE 50 MCG/ACT NA SUSP: 2.0000 | Freq: Every day | NASAL | Status: DC

## 2013-08-16 NOTE — Telephone Encounter (Signed)
[facility] - [place], [place] is requesting 90 day re-fills on the following:  alendronate (FOSAMAX) 70 MG tablet fluticasone (FLONASE) 50 MCG/ACT nasal spray

## 2013-08-16 NOTE — Telephone Encounter (Signed)
Please add levothyroxine (SYNTHROID, LEVOTHROID) 75 MCG tablet, 90 day re-fill

## 2013-08-16 NOTE — Telephone Encounter (Signed)
Rx's sent to [facility]

## 2013-09-03 ENCOUNTER — Telehealth: Payer: Self-pay | Admitting: *Deleted

## 2013-09-03 NOTE — Telephone Encounter (Signed)
Haley [name], pt inquired about referral to GYN. I saw sent to [facility] but pt said they do not take her insurance. Please find GYN for pt.

## 2013-09-04 NOTE — Telephone Encounter (Signed)
Forward the referral to [facility]  Obstetrician-Gynecologist  Address: [address], [place], [place] [address]  [phone] Their office will contact pt to schedule directly

## 2013-10-01 ENCOUNTER — Other Ambulatory Visit: Payer: Self-pay | Admitting: Internal Medicine

## 2013-10-08 ENCOUNTER — Ambulatory Visit (INDEPENDENT_AMBULATORY_CARE_PROVIDER_SITE_OTHER): Payer: Medicare Other | Admitting: Cardiovascular Disease

## 2013-10-08 VITALS — BP 132/68 | HR 52 | Ht 62.0 in | Wt 140.3 lb

## 2013-10-08 DIAGNOSIS — R002 Palpitations: Secondary | ICD-10-CM

## 2013-10-08 DIAGNOSIS — E785 Hyperlipidemia, unspecified: Secondary | ICD-10-CM

## 2013-10-08 DIAGNOSIS — M899 Disorder of bone, unspecified: Secondary | ICD-10-CM

## 2013-10-08 DIAGNOSIS — I4891 Unspecified atrial fibrillation: Secondary | ICD-10-CM

## 2013-10-08 DIAGNOSIS — M949 Disorder of cartilage, unspecified: Secondary | ICD-10-CM

## 2013-10-08 DIAGNOSIS — E039 Hypothyroidism, unspecified: Secondary | ICD-10-CM

## 2013-10-08 DIAGNOSIS — I480 Paroxysmal atrial fibrillation: Secondary | ICD-10-CM

## 2013-10-08 NOTE — Patient Instructions (Signed)
Your physician wants you to follow-up in: 6 months. You will receive a reminder letter in the mail two months in advance. If you don't receive a letter, please call our office to schedule the follow-up appointment. No changes were made today in your therapy.  

## 2013-10-10 ENCOUNTER — Encounter: Payer: Self-pay | Admitting: Cardiovascular Disease

## 2013-10-10 ENCOUNTER — Ambulatory Visit (INDEPENDENT_AMBULATORY_CARE_PROVIDER_SITE_OTHER): Payer: Medicare Other | Admitting: Gynecology

## 2013-10-10 ENCOUNTER — Encounter: Payer: Self-pay | Admitting: Gynecology

## 2013-10-10 ENCOUNTER — Other Ambulatory Visit (HOSPITAL_COMMUNITY)
Admission: RE | Admit: 2013-10-10 | Discharge: 2013-10-10 | Disposition: A | Discharge status: Home / Self Care | Payer: Medicare Other | Source: Ambulatory Visit | Attending: Gynecology | Admitting: Gynecology

## 2013-10-10 VITALS — BP 140/80 | Ht 61.5 in | Wt 137.0 lb

## 2013-10-10 DIAGNOSIS — R102 Pelvic and perineal pain: Secondary | ICD-10-CM

## 2013-10-10 DIAGNOSIS — Z1151 Encounter for screening for human papillomavirus (HPV): Secondary | ICD-10-CM | POA: Insufficient documentation

## 2013-10-10 DIAGNOSIS — Z720 Tobacco use: Secondary | ICD-10-CM

## 2013-10-10 DIAGNOSIS — M810 Age-related osteoporosis without current pathological fracture: Secondary | ICD-10-CM

## 2013-10-10 DIAGNOSIS — Z01419 Encounter for gynecological examination (general) (routine) without abnormal findings: Secondary | ICD-10-CM | POA: Insufficient documentation

## 2013-10-10 DIAGNOSIS — R351 Nocturia: Secondary | ICD-10-CM

## 2013-10-10 DIAGNOSIS — Z1272 Encounter for screening for malignant neoplasm of vagina: Secondary | ICD-10-CM

## 2013-10-10 DIAGNOSIS — N3941 Urge incontinence: Secondary | ICD-10-CM

## 2013-10-10 DIAGNOSIS — Z8639 Personal history of other endocrine, nutritional and metabolic disease: Secondary | ICD-10-CM

## 2013-10-10 DIAGNOSIS — F17200 Nicotine dependence, unspecified, uncomplicated: Secondary | ICD-10-CM

## 2013-10-10 MED ORDER — CLOBETASOL PROPIONATE 0.05 % EX CREA: TOPICAL_CREAM | CUTANEOUS | Status: DC

## 2013-10-10 MED ORDER — SULFAMETHOXAZOLE-TMP DS 800-160 MG PO TABS: 1.0000 | ORAL_TABLET | Freq: Two times a day (BID) | ORAL | Status: DC

## 2013-10-10 MED ORDER — ALENDRONATE SODIUM 70 MG PO TABS: 70.0000 mg | ORAL_TABLET | ORAL | Status: DC

## 2013-10-10 NOTE — Progress Notes (Signed)
Patient ID: ANTONYA LEEDER, female   DOB: 05/14/1945, 68 y.o.   MRN: [id number]     HPI: SHAUNIECE KWAN is a 68 y.o. female presents to the office today for a 6 month followup evaluation.  Ms. Sereno  has a history of palpitations, paroxysmal atrial fibrillation, hyperlipidemia, GERD, and hypothyroidism. She has been found to have an atherogenic dyslipidemia lipid panel. She also has a history of lower extremity varicosities and wears support stockings. When I  saw her in the past, she was having difficulty remembering to take her metoprolol on a twice a day regimen at that time I switched her to metoprolol succinate Toprol-XL 25 mg daily. She has felt markedly improved with reference to taking this once a day preparation. She is unaware of any breakthrough tachycardia palpitations. She denies presyncope or syncope. An NMR lipoprofile  showed marked improvement with a cholesterol of 174, triglycerides 119, HDL C. cholesterol 60, and LDL cholesterol at 90. Her LDL particle number was 1020 and her HDL particle number was 39.5. Her insulin resistance score was 39 which was normal.   Recent blood work one month ago showed a total cholesterol 165, triglycerides 100, HDL 54, LDL 91.  TSH was 2.03.  She was not anemic.  She normal chemistry profile.  Over the past 6 months, she is unaware of any recurrent palpitations.  She does note occasional left leg discomfort.  She does have hypothyroidism and is on Synthroid replacement therapy 75 mcg.  She is tolerating her Symbicort without myalgias.  She does take Fosamax for osteoporosis.  Past Medical History  Diagnosis Date  . Thyroid disease   . OA (osteoarthritis)   . MVP (mitral valve prolapse)     mild and MR  . Fibromyalgia   . Hemorrhoids     internal  . Paroxysmal a-fib   . Hyperlipidemia   . GERD (gastroesophageal reflux disease)   . Varicosities of leg   . H/O echocardiogram 04/28/08    EF>55% trace mitral regurgitation, No significant valvular  pathology  . History of stress test 02/08/2011    Normal Myocardial perfusion study, this is a low risk scan, No prior study available for comparison    Past Surgical History  Procedure Laterality Date  . Abdominal hysterectomy  1989  . Nose surgery  1990  . Colonoscopy  11-20-2000  . Appendectomy  1989    with hysterectomy    Allergies  Allergen Reactions  . Tramadol Hcl     REACTION: jittery    Current Outpatient Prescriptions  Medication Sig Dispense Refill  . ALPRAZolam (XANAX) 0.5 MG tablet TAKE ONE TABLET BY MOUTH ONCE DAILY AS NEEDED  30 tablet  2  . aspirin 81 MG tablet Take 81 mg by mouth daily.        . Calcium Carbonate (CALTRATE 600) 1500 MG TABS Take by mouth.        . cholecalciferol (VITAMIN D) 1000 UNITS tablet Take 1,000 Units by mouth daily.       . fluticasone (CUTIVATE) 0.05 % cream Apply 1 application topically 2 (two) times daily.       . fluticasone (FLONASE) 50 MCG/ACT nasal spray Place 2 sprays into both nostrils daily.  48 g  3  . levothyroxine (SYNTHROID, LEVOTHROID) 75 MCG tablet Take 1 tablet (75 mcg total) by mouth daily.  90 tablet  1  . metoprolol succinate (TOPROL XL) 25 MG 24 hr tablet Take 1 tablet (25 mg total) by mouth daily.  90 tablet  3  . Multiple Vitamins-Minerals (MULTIVITAMIN WITH MINERALS) tablet Take 1 tablet by mouth daily.        . Omega-3 Fatty Acids (FISH OIL) 1000 MG CAPS Take 1 capsule by mouth daily.      . pantoprazole (PROTONIX) 40 MG tablet Take 1 tablet (40 mg total) by mouth daily.  90 tablet  3  . simvastatin (ZOCOR) 20 MG tablet Take 1 tablet (20 mg total) by mouth at bedtime.  90 tablet  3  . tretinoin (RETIN-A) 0.025 % cream Apply topically at bedtime.       . vitamin C (ASCORBIC ACID) 500 MG tablet Take 500 mg by mouth daily.        Marland Kitchen alendronate (FOSAMAX) 70 MG tablet Take 1 tablet (70 mg total) by mouth every 7 (seven) days.  12 tablet  3  . clobetasol cream (TEMOVATE) 0.05 % Apply twice a week first week then once  weekly as needed  30 g  2  . sulfamethoxazole-trimethoprim (BACTRIM DS) 800-160 MG per tablet Take 1 tablet by mouth 2 (two) times daily.  14 tablet  5   No current facility-administered medications for this visit.    Socially she is married and has 2 children and 4 grandchildren with one great grandchild. She smokes little cigars. [facility].  ROS General: Negative; No fevers, chills, or night sweats;  HEENT: Negative; No changes in vision or hearing, sinus congestion, difficulty swallowing Pulmonary: Negative; No cough, wheezing, shortness of breath, hemoptysis Cardiovascular: Negative; No chest pain, presyncope, syncope, palpitations GI: Negative; No nausea, vomiting, diarrhea, or abdominal pain GU: Negative; No dysuria, hematuria, or difficulty voiding Musculoskeletal: Occasional left leg discomfort no myalgias, joint pain, or weakness Hematologic/Oncology: Negative; no easy bruising, bleeding Endocrine: Positive for hypothyroidism, on Synthroid e; no diabetes Neuro: Negative; no changes in balance, headaches Skin: Negative; No rashes or skin lesions Psychiatric: Negative; No behavioral problems, depression Sleep: Negative; No snoring, daytime sleepiness, hypersomnolence, bruxism, restless legs, hypnogognic hallucinations, no cataplexy Other comprehensive 14 point system review is negative.   PE BP 132/68  Pulse 52  Ht 5\' 2"  (1.575 m)  Wt 140 lb 4.8 oz (63.64 kg)  BMI 25.65 kg/m2  General: Alert, oriented, no distress.  HEENT: Normocephalic, atraumatic. Pupils round and reactive; sclera anicteric; no xanthelasmas Nose without nasal septal hypertrophy Mouth/Parynx benign; Mallinpatti scale 2 Neck: No JVD, no carotid bruits with normal carotid upstroke Chest wall: Nontender to palpation Lungs: clear to ausculatation and percussion; no wheezing or rales Heart: RRR, s1 s2 normal 1/6 SEM, no diastolic murmur. No S3 or S4 gallop.  No rubs thrills or heaves. Abdomen: soft, nontender; no hepatosplenomehaly, BS+; abdominal aorta nontender and not dilated by palpation. Back: No CVA tenderness Pulses 2+ Extremities:no clubbinbg cyanosis or edema, Homan's sign negative  Neurologic: grossly nonfocal Psychological: Normal affect and mood  ECG (independently read by me): Sinus bradycardia 52 beats per minute.  QS complex in V1 and V2.  Nonspecific ST change.  QTc interval 398 ms  Prior March 2015 ECG (independently read by me): Sinus bradycardia 51 beats per minute. No ectopy. Normal intervals.  Prior 09/14/2012 ECG: Sinus rhythm at 53 beats per minute. She has poor anterior R-wave progression. Normal intervals  LABS:  BMET    Component Value Date/Time   NA 141 08/13/2013 0935   K 3.9 08/13/2013 0935   CL 105 08/13/2013 0935   CO2 28 08/13/2013 0935  GLUCOSE 75 08/13/2013 0935   BUN 13 08/13/2013 0935   CREATININE 0.8 08/13/2013 0935   CALCIUM 9.3 08/13/2013 0935   GFRNONAA 97.08 09/10/2009 0918   GFRAA 93 08/08/2007 1222     Hepatic Function Panel     Component Value Date/Time   PROT 6.8 08/13/2013 0935   ALBUMIN 3.9 08/13/2013 0935   AST 15 08/13/2013 0935   ALT 11 08/13/2013 0935   ALKPHOS 59 08/13/2013 0935   BILITOT 0.9 08/13/2013 0935   BILIDIR 0.0 12/16/2010 0921     CBC    Component Value Date/Time   WBC 8.3 08/13/2013 0935   RBC 4.53 08/13/2013 0935   HGB 13.7 08/13/2013 0935   HCT 41.0 08/13/2013 0935   PLT 187.0 08/13/2013 0935   MCV 90.5 08/13/2013 0935   MCHC 33.4 08/13/2013 0935   RDW 14.3 08/13/2013 0935   LYMPHSABS 2.3 08/13/2013 0935   MONOABS 0.6 08/13/2013 0935   EOSABS 0.8* 08/13/2013 0935   BASOSABS 0.0 08/13/2013 0935     BNP No results found for this basename: probnp    Lipid Panel     Component Value Date/Time   CHOL 165 08/13/2013 0935   TRIG 100.0 08/13/2013 0935   HDL 54.40 08/13/2013 0935   CHOLHDL 3 08/13/2013 0935   VLDL 20.0 08/13/2013 0935   LDLCALC 91 08/13/2013 0935     RADIOLOGY: No results  found.    ASSESSMENT AND PLAN: Ms. Pinho is a 68 year old female with a history of palpitations that have improved with once a day long-acting beta blocker therapy.  She is maintaining sinus rhythm and is bradycardic, but asymptomatic with reference to this.  She specifically denies dizziness.  There are no orthostatic symptoms. An echocardiogram has demonstrated normal systolic and diastolic function. She had very mild aortic sclerosis. A nuclear perfusion study done in January 2013 showed normal perfusion without scar or ischemia and she had a post stress ejection fraction of 75%.  I reviewed her recent laboratory.  She is tolerating simvastatin 20 mg and lipid studies remain stable.  TSH is normal.  Her current dose of levothyroxine.  She's not having any edema.  Her body mass index is stable at 25.6.  She does note intermittent left leg discomfort.  I did not see any edema.  She has negative Homans signs.  There are no apparent processes.  She sees Dr. Burnice Logan in for primary care and previously had seen Dr. Leanne Chang.  From a cardiac standpoint she is stable.  She prefers to be seen at six-month intervals.  I will schedule her followup appointment at that time.   Troy Sine, MD, [facility]  10/10/2013 7:09 PM

## 2013-10-10 NOTE — Progress Notes (Signed)
[place] 1945-04-27 [id number]   History:    68 y.o.  presented to the office today as a new patient. Patient's complaint today was run of urgency incontinence and nocturia. Patient brought records from previous provider who has been treating her with Fosamax 70 mg q. weekly for osteoporosis which was initiated in 2010. Her last bone density study was in 2013. Patient with past history of total abdominal hysterectomy with appendectomy secondary to leiomyomatous uteri dysmenorrhea and menorrhagia. Patient also has had past history of vitamin D deficiency. Patient denied any prior history of abnormal Pap smears before her hysterectomy. She stated that she had a normal colonoscopy in 2014 as well as a 3-dimensional mammogram. Patient has a history of smoking. Review of her record also indicated in 2010 she had a normal vertebral fracture assessment study.  Patient for past few months has been complaining of low abdominal discomfort.   Past medical history,surgical history, family history and social history were all reviewed and documented in the EPIC chart.  Gynecologic History No LMP recorded. Patient has had a hysterectomy. Contraception: status post hysterectomy Last Pap: 2008. Results were: normal Last mammogram: 2014. Results were: Normal but dense three-dimensional mammogram  Obstetric History OB History  Gravida Para Term Preterm AB SAB TAB Ectopic Multiple Living  3 3        3     # Outcome Date GA Lbr Len/2nd Weight Sex Delivery Anes PTL Lv  3 PAR           2 PAR           1 PAR                ROS: A ROS was performed and pertinent positives and negatives are included in the history.  GENERAL: No fevers or chills. HEENT: No change in vision, no earache, sore throat or sinus congestion. NECK: No pain or stiffness. CARDIOVASCULAR: No chest pain or pressure. No palpitations. PULMONARY: No shortness of breath, cough or wheeze. GASTROINTESTINAL: No abdominal pain, nausea,  vomiting or diarrhea, melena or bright red blood per rectum. GENITOURINARY: Urgency incontinence and nocturia MUSCULOSKELETAL: No joint or muscle pain, no back pain, no recent trauma. DERMATOLOGIC: No rash, no itching, no lesions. ENDOCRINE: No polyuria, polydipsia, no heat or cold intolerance. No recent change in weight. HEMATOLOGICAL: No anemia or easy bruising or bleeding. NEUROLOGIC: No headache, seizures, numbness, tingling or weakness. PSYCHIATRIC: No depression, no loss of interest in normal activity or change in sleep pattern.     Exam: chaperone present  BP 140/80  Ht 5' 1.5" (1.562 m)  Wt 137 lb (62.143 kg)  BMI 25.47 kg/m2  Body mass index is 25.47 kg/(m^2).  General appearance : Well developed well nourished female. No acute distress HEENT: Neck supple, trachea midline, no carotid bruits, no thyroidmegaly Lungs: Clear to auscultation, no rhonchi or wheezes, or rib retractions  Heart: Regular rate and rhythm, no murmurs or gallops Breast:Examined in sitting and supine position were symmetrical in appearance, no palpable masses or tenderness,  no skin retraction, no nipple inversion, no nipple discharge, no skin discoloration, no axillary or supraclavicular lymphadenopathy Abdomen: no palpable masses or tenderness, no rebound or guarding Extremities: no edema or skin discoloration or tenderness  Pelvic:  Bartholin, Urethra, Skene Glands: Within normal limits             Vagina: No gross lesions or discharge  Cervix: Absent  Uterus  Absent  Adnexa  Without masses or tenderness  Anus and perineum  normal   Rectovaginal  normal sphincter tone without palpated masses or tenderness             Hemoccult PCP provides     Assessment/Plan:  68 y.o. female with complaints of urgency incontinence or nocturia. Patient consumes large quantities of fluid during the day and an average of 4-5 cups of copy in the morning alone. I'm going to give her information on Kegel exercises. On exam  there was no evidence of cystocele, rectocele, or vaginal wall prolapse. On Valsalva she did not leak urine in the office today. She was counseled once again on the detrimental effects of smoking. She was scheduled for bone density in her mammogram for next month. We discussed importance of calcium and vitamin D and regular exercise for osteoporosis prevention. This will be the patient's last Pap smear according to the guidelines. Patient returned to the office in 1-2 weeks for a pelvic ultrasound due to her lower abdominal discomfort. We will check her vitamin D level today. Her PCP will be drawn and the rest of her blood work.   Terrance Mass MD, 11:07 AM 10/10/2013

## 2013-10-10 NOTE — Patient Instructions (Addendum)
Smoking Cessation Quitting smoking is important to your health and has many advantages. However, it is not always easy to quit since nicotine is a very addictive drug. Oftentimes, people try 3 times or more before being able to quit. This document explains the best ways for you to prepare to quit smoking. Quitting takes hard work and a lot of effort, but you can do it. ADVANTAGES OF QUITTING SMOKING  You will live longer, feel better, and live better.  Your body will feel the impact of quitting smoking almost immediately.  Within 20 minutes, blood pressure decreases. Your pulse returns to its normal level.  After 8 hours, carbon monoxide levels in the blood return to normal. Your oxygen level increases.  After 24 hours, the chance of having a heart attack starts to decrease. Your breath, hair, and body stop smelling like smoke.  After 48 hours, damaged nerve endings begin to recover. Your sense of taste and smell improve.  After 72 hours, the body is virtually free of nicotine. Your bronchial tubes relax and breathing becomes easier.  After 2 to 12 weeks, lungs can hold more air. Exercise becomes easier and circulation improves.  The risk of having a heart attack, stroke, cancer, or lung disease is greatly reduced.  After 1 year, the risk of coronary heart disease is cut in half.  After 5 years, the risk of stroke falls to the same as a nonsmoker.  After 10 years, the risk of lung cancer is cut in half and the risk of other cancers decreases significantly.  After 15 years, the risk of coronary heart disease drops, usually to the level of a nonsmoker.  If you are pregnant, quitting smoking will improve your chances of having a healthy baby.  The people you live with, especially any children, will be healthier.  You will have extra money to spend on things other than cigarettes. QUESTIONS TO THINK ABOUT BEFORE ATTEMPTING TO QUIT You may want to talk about your answers with your  health care provider.  Why do you want to quit?  If you tried to quit in the past, what helped and what did not?  What will be the most difficult situations for you after you quit? How will you plan to handle them?  Who can help you through the tough times? Your family? Friends? A health care provider?  What pleasures do you get from smoking? What ways can you still get pleasure if you quit? Here are some questions to ask your health care provider:  How can you help me to be successful at quitting?  What medicine do you think would be best for me and how should I take it?  What should I do if I need more help?  What is smoking withdrawal like? How can I get information on withdrawal? GET READY  Set a quit date.  Change your environment by getting rid of all cigarettes, ashtrays, matches, and lighters in your home, car, or work. Do not let people smoke in your home.  Review your past attempts to quit. Think about what worked and what did not. GET SUPPORT AND ENCOURAGEMENT You have a better chance of being successful if you have help. You can get support in many ways.  Tell your family, friends, and coworkers that you are going to quit and need their support. Ask them not to smoke around you.  Get individual, group, or telephone counseling and support. Programs are available at local hospitals and health centers. Call   your local health department for information about programs in your area.  Spiritual beliefs and practices may help some smokers quit.  Download a "quit meter" on your computer to keep track of quit statistics, such as how long you have gone without smoking, cigarettes not smoked, and money saved.  Get a self-help book about quitting smoking and staying off tobacco. [place] yourself from urges to smoke. Talk to someone, go for a walk, or occupy your time with a task.  Change your normal routine. Take a different route to work.  Drink tea instead of coffee. Eat breakfast in a different place.  Reduce your stress. Take a hot bath, exercise, or read a book.  Plan something enjoyable to do every day. Reward yourself for not smoking.  Explore interactive web-based programs that specialize in helping you quit. GET MEDICINE AND USE IT CORRECTLY Medicines can help you stop smoking and decrease the urge to smoke. Combining medicine with the above behavioral methods and support can greatly increase your chances of successfully quitting smoking.  Nicotine replacement therapy helps deliver nicotine to your body without the negative effects and risks of smoking. Nicotine replacement therapy includes nicotine gum, lozenges, inhalers, nasal sprays, and skin patches. Some may be available over-the-counter and others require a prescription.  Antidepressant medicine helps people abstain from smoking, but how this works is unknown. This medicine is available by prescription.  Nicotinic receptor partial agonist medicine simulates the effect of nicotine in your brain. This medicine is available by prescription. Ask your health care provider for advice about which medicines to use and how to use them based on your health history. Your health care provider will tell you what side effects to look out for if you choose to be on a medicine or therapy. Carefully read the information on the package. Do not use any other product containing nicotine while using a nicotine replacement product.  RELAPSE OR DIFFICULT SITUATIONS Most relapses occur within the first 3 months after quitting. Do not be discouraged if you start smoking again. Remember, most people try several times before finally quitting. You may have symptoms of withdrawal because your body is used to nicotine. You may crave cigarettes, be irritable, feel very hungry, cough often, get headaches, or have difficulty concentrating. The withdrawal symptoms are only temporary. They are strongest  when you first quit, but they will go away within 10-14 days. To reduce the chances of relapse, try to:  Avoid drinking alcohol. Drinking lowers your chances of successfully quitting.  Reduce the amount of caffeine you consume. Once you quit smoking, the amount of caffeine in your body increases and can give you symptoms, such as a rapid heartbeat, sweating, and anxiety.  Avoid smokers because they can make you want to smoke.  Do not let weight gain distract you. Many smokers will gain weight when they quit, usually less than 10 pounds. Eat a healthy diet and stay active. You can always lose the weight gained after you quit.  Find ways to improve your mood other than smoking. FOR MORE INFORMATION  www.smokefree.gov  Document Released: 12/21/2000 Document Revised: 05/13/2013 Document Reviewed: 04/07/2011 [facility] Patient Information 2015 [place], [place]. This information is not intended to replace advice given to you by your health care provider. Make sure you discuss any questions you have with your health care provider. Overactive Bladder The bladder has two functions that are totally opposite of the other. One is to relax and stretch out so  it can store urine (fills like a balloon), and the other is to contract and squeeze down so that it can empty the urine that it has stored. Proper functioning of the bladder is a complex mixing of these two functions. The filling and emptying of the bladder can be influenced by:  The bladder.  The spinal cord.  The brain.  The nerves going to the bladder.  Other organs that are closely related to the bladder such as prostate in males and the vagina in females. As your bladder fills with urine, nerve signals are sent from the bladder to the brain to tell you that you may need to urinate. Normal urination requires that the bladder squeeze down with sufficient strength to empty the bladder, but this also requires that the bladder squeeze down  sufficiently long to finish the job. In addition the sphincter muscles, which normally keep you from leaking urine, must also relax so that the urine can pass. Coordination between the bladder muscle squeezing down and the sphincter muscles relaxing is required to make everything happen normally. With an overactive bladder sometimes the muscles of the bladder contract unexpectedly and involuntarily and this causes an urgent need to urinate. The normal response is to try to hold urine in by contracting the sphincter muscles. Sometimes the bladder contracts so strongly that the sphincter muscles cannot stop the urine from passing out and incontinence occurs. This kind of incontinence is called urge incontinence. Having an overactive bladder can be embarrassing and awkward. It can keep you from living life the way you want to. Many people think it is just something you have to put up with as you grow older or have certain health conditions. In fact, there are treatments that can help make your life easier and more pleasant. CAUSES  Many things can cause an overactive bladder. Possibilities include:  Urinary tract infection or infection of nearby tissues such as the prostate.  Prostate enlargement.  In women, multiple pregnancies or surgery on the uterus or urethra.  Bladder stones, inflammation, or tumors.  Caffeine.  Alcohol.  Medications. For example, diuretics (drugs that help the body get rid of extra fluid) increase urine production. Some other medicines must be taken with lots of fluids.  Muscle or nerve weakness. This might be the result of a spinal cord injury, a stroke, multiple sclerosis, or Parkinson disease.  Diabetes can cause a high urine volume which fills the bladder so quickly that the normal urge to urinate is triggered very strongly. SYMPTOMS   Loss of bladder control. You feel the need to urinate and cannot make your body wait.  Sudden, strong urges to  urinate.  Urinating 8 or more times a day.  Waking up to urinate two or more times a night. DIAGNOSIS  To decide if you have overactive bladder, your health care provider will probably:  Ask about symptoms you have noticed.  Ask about your overall health. This will include questions about any medications you are taking.  Do a physical examination. This will help determine if there are obvious blockages or other problems.  Order some tests. These might include:  A blood test to check for diabetes or other health issues that could be contributing to the problem.  Urine testing. This could measure the flow of urine and the pressure on the bladder.  A test of your neurological system (the brain, spinal cord, and nerves). This is the system that senses the need to urinate. Some of these tests  are called flow tests, bladder pressure tests, and electrical measurements of the sphincter muscle.  A bladder test to check whether it is emptying completely when you urinate.  Cystoscopy. This test uses a thin tube with a tiny camera on it. It offers a look inside your urethra and bladder to see if there are problems.  Imaging tests. You might be given a contrast dye and then asked to urinate. X-rays are taken to see how your bladder is working. TREATMENT  An overactive bladder can be treated in many ways. The treatment will depend on the cause. Whether you have a mild or severe case also makes a difference. Often, treatment can be given in your health care provider's office or clinic. Be sure to discuss the different options with your caregiver. They include:  Behavioral treatments. These do not involve medication or surgery:  Bladder training. For this, you would follow a schedule to urinate at regular intervals. This helps you learn to control the urge to urinate. At first, you might be asked to wait a few minutes after feeling the urge. In time, you should be able to schedule bathroom visits an  hour or more apart.  Kegel exercises. These exercises strengthen the pelvic floor muscles, which support the bladder. Toning these muscles can help control urination even if the bladder muscles are overactive. A specialist will teach you how to do these exercises correctly. They will require daily practice.  Weight loss. If you are obese or overweight, losing weight might stop your bladder from being overactive. Talk to your health care provider about how many pounds you should lose. Also ask if there is a specific program or method that would work best for you.  Diet change. This might be suggested if constipation is making your overactive bladder worse. Your health care provider or a nutritionist can explain ways to change what you eat to ease constipation. Other people might need to take in less caffeine or alcohol. Sometimes drinking fewer fluids is needed, too.  Protection. This is not an actual treatment. But, you could wear special pads to take care of any leakage while you wait for other treatments to take effect. This will help you avoid embarrassment.  Physical treatments.  Electrical stimulation. Electrodes will send gentle pulses to the nerves or muscles that help control the bladder. The goal is to strengthen them. Sometimes this is done with the electrodes outside the body. Or, they might be placed inside the body (implanted). This treatment can take several months to have an effect.  Medications. These are usually used along with other treatments. Several medicines are available. Some are injected into the muscles involved in urination. Others come in pill form. Medications sometimes prescribed include:  Anticholinergics. These drugs block the signals that the nerves deliver to the bladder. This keeps it from releasing urine at the wrong time. Researchers think the drugs might help in other ways, too.  Imipramine. This is an antidepressant. But, it relaxes bladder muscles.  Botox.  This is still experimental. Some people believe that injecting it into the bladder muscles will relax them so they work more normally. It has also been injected into the sphincter muscle when the sphincter muscle does not open properly. This is a temporary fix, however. Also, it might make matters worse, especially in older people.  Surgery.  A device might be implanted to help manage your nerves. It works on the nerves that signal when you need to urinate.  Surgery is sometimes  needed with electrical stimulation. If the electrodes are implanted, this is done through surgery.  Sometimes repairs need to be made through surgery. For example, the size of the bladder can be changed. This is usually done in severe cases only. HOME CARE INSTRUCTIONS   Take any medications your health care provider prescribed or suggested. Follow the directions carefully.  Practice any lifestyle changes that are recommended. These might include:  Drinking less fluid or drinking at different times of the day. If you need to urinate often during the night, for example, you may need to stop drinking fluids early in the evening.  Cutting down on caffeine or alcohol. They can both make an overactive bladder worse. Caffeine is found in coffee, tea, and sodas.  Doing Kegel exercises to strengthen muscles.  Losing weight, if that is recommended.  Eating a healthy and balanced diet. This will help you avoid constipation.  Keep a journal or a log. You might be asked to record how much you drink and when, and also when you feel the need to urinate.  Learn how to care for implants or other devices, such as pessaries. SEEK MEDICAL CARE IF:   Your overactive bladder gets worse.  You feel increased pain or irritation when you urinate.  You notice blood in your urine.  You have questions about any medications or devices that your health care provider recommended.  You notice blood, pus, or swelling at the site of any  test or treatment procedure.  You have an oral temperature above 102F (38.9C). SEEK IMMEDIATE MEDICAL CARE IF:  You have an oral temperature above 102F (38.9C), not controlled by medicine. Document Released: 10/23/2008 Document Revised: 05/13/2013 Document Reviewed: 10/23/2008 [facility] Patient Information 2015 [place], [place]. This information is not intended to replace advice given to you by your health care provider. Make sure you discuss any questions you have with your health care provider. Kegel Exercises The goal of Kegel exercises is to isolate and exercise your pelvic floor muscles. These muscles act as a hammock that supports the rectum, vagina, small intestine, and uterus. As the muscles weaken, the hammock sags and these organs are displaced from their normal positions. Kegel exercises can strengthen your pelvic floor muscles and help you to improve bladder and bowel control, improve sexual response, and help reduce many problems and some discomfort during pregnancy. Kegel exercises can be done anywhere and at any time. HOW TO PERFORM KEGEL EXERCISES 1. Locate your pelvic floor muscles. To do this, squeeze (contract) the muscles that you use when you try to stop the flow of urine. You will feel a tightness in the vaginal area (women) and a tight lift in the rectal area (men and women). 2. When you begin, contract your pelvic muscles tight for 2-5 seconds, then relax them for 2-5 seconds. This is one set. Do 4-5 sets with a short pause in between. 3. Contract your pelvic muscles for 8-10 seconds, then relax them for 8-10 seconds. Do 4-5 sets. If you cannot contract your pelvic muscles for 8-10 seconds, try 5-7 seconds and work your way up to 8-10 seconds. Your goal is 4-5 sets of 10 contractions each day. Keep your stomach, buttocks, and legs relaxed during the exercises. Perform sets of both short and long contractions. Vary your positions. Perform these contractions 3-4 times per day.  Perform sets while you are:   Lying in bed in the morning.  Standing at lunch.  Sitting in the late afternoon.  Lying in bed  at night. You should do 40-50 contractions per day. Do not perform more Kegel exercises per day than recommended. Overexercising can cause muscle fatigue. Continue these exercises for for at least 15-20 weeks or as directed by your caregiver. Document Released: 12/14/2011 Document Reviewed: 12/14/2011 [facility] Patient Information 2015 [place]. This information is not intended to replace advice given to you by your health care provider. Make sure you discuss any questions you have with your health care provider.

## 2013-10-11 LAB — VITAMIN D 25 HYDROXY (VIT D DEFICIENCY, FRACTURES): Vit D, 25-Hydroxy: 42 ng/mL (ref 30–89)

## 2013-10-11 LAB — CYTOLOGY - PAP

## 2013-10-28 ENCOUNTER — Ambulatory Visit (INDEPENDENT_AMBULATORY_CARE_PROVIDER_SITE_OTHER): Payer: Medicare Other

## 2013-10-28 ENCOUNTER — Encounter: Payer: Self-pay | Admitting: Gynecology

## 2013-10-28 ENCOUNTER — Other Ambulatory Visit: Payer: Self-pay | Admitting: Gynecology

## 2013-10-28 ENCOUNTER — Ambulatory Visit (INDEPENDENT_AMBULATORY_CARE_PROVIDER_SITE_OTHER): Payer: Medicare Other | Admitting: Gynecology

## 2013-10-28 DIAGNOSIS — N83319 Acquired atrophy of ovary, unspecified side: Secondary | ICD-10-CM

## 2013-10-28 DIAGNOSIS — R102 Pelvic and perineal pain: Secondary | ICD-10-CM

## 2013-10-28 DIAGNOSIS — Z720 Tobacco use: Secondary | ICD-10-CM

## 2013-10-28 DIAGNOSIS — M8580 Other specified disorders of bone density and structure, unspecified site: Secondary | ICD-10-CM

## 2013-10-28 DIAGNOSIS — F17200 Nicotine dependence, unspecified, uncomplicated: Secondary | ICD-10-CM

## 2013-10-28 DIAGNOSIS — N8331 Acquired atrophy of ovary: Secondary | ICD-10-CM

## 2013-10-28 NOTE — Progress Notes (Signed)
   Patient presented to the office today to discuss the ultrasound that was ordered her last exam October 1. Patient been complaining of low abdominal discomfort. Patient with history of urgency incontinence and nocturia she consumes large quantities of fluid she was counseled as to. I provided her with information on Kegel exercises.She was counseled once again on the detrimental effects of smoking. Patient with past history of total abdominal hysterectomy with appendectomy secondary to leiomyomatous uteri dysmenorrhea and menorrhagia.    Ultrasound today:  Absent uterus. Right and left ovary normal echo atrophic pattern. No apparent masses seen on either adnexa. No free fluid in the cul-de-sac.  Assessment/plan: #1 normal pelvic ultrasound followup in one year #2 chronic smoker counseled once again the detrimental effects of smoking #3 history of osteopenia lowest T score right femoral neck -2.4 done 2013. Patient currently on Fosamax. #4 patient is to schedule mammogram end of the year

## 2013-11-05 ENCOUNTER — Telehealth: Payer: Self-pay | Admitting: *Deleted

## 2013-11-05 ENCOUNTER — Other Ambulatory Visit: Payer: Self-pay | Admitting: Gynecology

## 2013-11-05 DIAGNOSIS — Z1231 Encounter for screening mammogram for malignant neoplasm of breast: Secondary | ICD-10-CM

## 2013-11-05 DIAGNOSIS — M810 Age-related osteoporosis without current pathological fracture: Secondary | ICD-10-CM

## 2013-11-05 NOTE — Telephone Encounter (Signed)
[facility] Order placed, pt will call and schedule.

## 2013-11-07 ENCOUNTER — Telehealth: Payer: Self-pay | Admitting: *Deleted

## 2013-11-07 MED ORDER — SULFAMETHOXAZOLE-TMP DS 800-160 MG PO TABS: ORAL_TABLET | ORAL | Status: DC

## 2013-11-07 NOTE — Telephone Encounter (Signed)
Pt called requesting to have Rx for Bactrum DS from 10/10/13 OV. Rx will be sent to mail.

## 2013-11-11 ENCOUNTER — Encounter: Payer: Self-pay | Admitting: Gynecology

## 2013-12-10 ENCOUNTER — Other Ambulatory Visit: Payer: Self-pay

## 2013-12-10 MED ORDER — LEVOTHYROXINE SODIUM 75 MCG PO TABS: 75.0000 ug | ORAL_TABLET | Freq: Every day | ORAL | Status: DC

## 2013-12-10 NOTE — Telephone Encounter (Signed)
Rx request for Levothyroxine 75 mcg.  Rx sent to pharmacy for 1 year supply.

## 2013-12-10 NOTE — Addendum Note (Signed)
Addended by: Colleen Can on: 12/10/2013 03:18 PM   Modules accepted: Orders

## 2013-12-14 ENCOUNTER — Other Ambulatory Visit: Payer: Self-pay | Admitting: Gastroenterology

## 2013-12-23 ENCOUNTER — Ambulatory Visit (HOSPITAL_COMMUNITY): Payer: Medicare Other

## 2013-12-24 ENCOUNTER — Other Ambulatory Visit: Payer: Self-pay | Admitting: Cardiovascular Disease

## 2013-12-24 ENCOUNTER — Ambulatory Visit (HOSPITAL_COMMUNITY)
Admission: RE | Admit: 2013-12-24 | Discharge: 2013-12-24 | Disposition: A | Discharge status: Home / Self Care | Payer: Medicare Other | Source: Ambulatory Visit | Attending: Gynecology | Admitting: Gynecology

## 2013-12-24 DIAGNOSIS — Z1231 Encounter for screening mammogram for malignant neoplasm of breast: Secondary | ICD-10-CM

## 2013-12-24 DIAGNOSIS — Z780 Asymptomatic menopausal state: Secondary | ICD-10-CM | POA: Diagnosis not present

## 2013-12-24 DIAGNOSIS — M810 Age-related osteoporosis without current pathological fracture: Secondary | ICD-10-CM

## 2013-12-24 DIAGNOSIS — Z13820 Encounter for screening for osteoporosis: Secondary | ICD-10-CM | POA: Insufficient documentation

## 2013-12-25 ENCOUNTER — Telehealth: Payer: Self-pay | Admitting: *Deleted

## 2013-12-25 NOTE — Telephone Encounter (Signed)
Pt informed with the below note, transferred to front desk.  

## 2013-12-25 NOTE — Telephone Encounter (Signed)
-----   Message from Terrance Mass, MD sent at 12/24/2013  5:52 PM EST ----- Please contact patient and that I reviewed her bone density study and there is evidence of osteoporosis. I would like to see her in consultation to discuss treatment options.

## 2014-01-09 ENCOUNTER — Encounter: Payer: Self-pay | Admitting: Gynecology

## 2014-01-13 ENCOUNTER — Encounter: Payer: Self-pay | Admitting: Gynecology

## 2014-01-13 ENCOUNTER — Ambulatory Visit (INDEPENDENT_AMBULATORY_CARE_PROVIDER_SITE_OTHER): Payer: PPO | Admitting: Gynecology

## 2014-01-13 DIAGNOSIS — M810 Age-related osteoporosis without current pathological fracture: Secondary | ICD-10-CM

## 2014-01-13 DIAGNOSIS — N3941 Urge incontinence: Secondary | ICD-10-CM | POA: Insufficient documentation

## 2014-01-13 MED ORDER — TOLTERODINE TARTRATE ER 2 MG PO CP24: 2.0000 mg | ORAL_CAPSULE | Freq: Every day | ORAL | Status: DC

## 2014-01-13 NOTE — Progress Notes (Signed)
   Patient presented to the office today to discuss her recent bone density study and for comparison with 2013. Her bone density in 2013 demonstrated that her lowest T score was -2.4 at the right femoral neck. Patient was seen hasn't [facility] patient last year for the first time. Her previous provider had her on Fosamax 70 mg every weekly which was initiated in 2010 as a result of her osteoporosis. Patient smokes half pack cigarette per day for many years despite having been counseled in the past. She also was complaining of occasional urinary incontinence but she does have what sounds like urgency incontinence. She also has nocturia.  Bone density study done 12/24/2013 demonstrated that the lowest T score was -2.5 at the right femoral neck with decrease in bone mineralization of -2.3%. This study was compared at the same facility with study from 2013 and radiologist had documented that there was no statistically significant change. Patient's recent vitamin D level was 42 and had a normal calcium level. Patient states that she exercises regularly.  Assessment/plan: #1 stable osteoporosis on Fosamax 70 mg every weekly. We discussed staying on medication for 2 more years and then after next bone density study proceed with drug holiday. She was reminded to take her calcium and vitamin D daily continue with her weightbearing exercises 3 times a week. #2 we discussed the detrimental effects of smoking and literature information was provided #3 patient with urgency incontinence had previously been given literature information on [place] exercises. Patient states that she is having to wear a pad. On recent exam there was no evidence of cystocele or rectocele or prolapse. Patient will be started on low-dose Detrol LA 2 mg daily. Risk benefits and pros and cons of the medication were discussed. No history of glaucoma reported.

## 2014-01-13 NOTE — Patient Instructions (Addendum)
Smoking Smoking Cessation Quitting smoking is important to your health and has many advantages. However, it is not always easy to quit since nicotine is a very addictive drug. Oftentimes, people try 3 times or more before being able to quit. This document explains the best ways for you to prepare to quit smoking. Quitting takes hard work and a lot of effort, but you can do it. ADVANTAGES OF QUITTING SMOKING  You will live longer, feel better, and live better.  Your body will feel the impact of quitting smoking almost immediately.  Within 20 minutes, blood pressure decreases. Your pulse returns to its normal level.  After 8 hours, carbon monoxide levels in the blood return to normal. Your oxygen level increases.  After 24 hours, the chance of having a heart attack starts to decrease. Your breath, hair, and body stop smelling like smoke.  After 48 hours, damaged nerve endings begin to recover. Your sense of taste and smell improve.  After 72 hours, the body is virtually free of nicotine. Your bronchial tubes relax and breathing becomes easier.  After 2 to 12 weeks, lungs can hold more air. Exercise becomes easier and circulation improves.  The risk of having a heart attack, stroke, cancer, or lung disease is greatly reduced.  After 1 year, the risk of coronary heart disease is cut in half.  After 5 years, the risk of stroke falls to the same as a nonsmoker.  After 10 years, the risk of lung cancer is cut in half and the risk of other cancers decreases significantly.  After 15 years, the risk of coronary heart disease drops, usually to the level of a nonsmoker.  If you are pregnant, quitting smoking will improve your chances of having a healthy baby.  The people you live with, especially any children, will be healthier.  You will have extra money to spend on things other than cigarettes. QUESTIONS TO THINK ABOUT BEFORE ATTEMPTING TO QUIT You may want to talk about your answers with  your health care provider.  Why do you want to quit?  If you tried to quit in the past, what helped and what did not?  What will be the most difficult situations for you after you quit? How will you plan to handle them?  Who can help you through the tough times? Your family? Friends? A health care provider?  What pleasures do you get from smoking? What ways can you still get pleasure if you quit? Here are some questions to ask your health care provider:  How can you help me to be successful at quitting?  What medicine do you think would be best for me and how should I take it?  What should I do if I need more help?  What is smoking withdrawal like? How can I get information on withdrawal? GET READY  Set a quit date.  Change your environment by getting rid of all cigarettes, ashtrays, matches, and lighters in your home, car, or work. Do not let people smoke in your home.  Review your past attempts to quit. Think about what worked and what did not. GET SUPPORT AND ENCOURAGEMENT You have a better chance of being successful if you have help. You can get support in many ways.  Tell your family, friends, and coworkers that you are going to quit and need their support. Ask them not to smoke around you.  Get individual, group, or telephone counseling and support. Programs are available at [employer] and health centers.  Call your local health department for information about programs in your area.  Spiritual beliefs and practices may help some smokers quit.  Download a "quit meter" on your computer to keep track of quit statistics, such as how long you have gone without smoking, cigarettes not smoked, and money saved.  Get a self-help book about quitting smoking and staying off tobacco. Irwin yourself from urges to smoke. Talk to someone, go for a walk, or occupy your time with a task.  Change your normal routine. Take a different route to  work. Drink tea instead of coffee. Eat breakfast in a different place.  Reduce your stress. Take a hot bath, exercise, or read a book.  Plan something enjoyable to do every day. Reward yourself for not smoking.  Explore interactive web-based programs that specialize in helping you quit. GET MEDICINE AND USE IT CORRECTLY Medicines can help you stop smoking and decrease the urge to smoke. Combining medicine with the above behavioral methods and support can greatly increase your chances of successfully quitting smoking.  Nicotine replacement therapy helps deliver nicotine to your body without the negative effects and risks of smoking. Nicotine replacement therapy includes nicotine gum, lozenges, inhalers, nasal sprays, and skin patches. Some may be available over-the-counter and others require a prescription.  Antidepressant medicine helps people abstain from smoking, but how this works is unknown. This medicine is available by prescription.  Nicotinic receptor partial agonist medicine simulates the effect of nicotine in your brain. This medicine is available by prescription. Ask your health care provider for advice about which medicines to use and how to use them based on your health history. Your health care provider will tell you what side effects to look out for if you choose to be on a medicine or therapy. Carefully read the information on the package. Do not use any other product containing nicotine while using a nicotine replacement product.  RELAPSE OR DIFFICULT SITUATIONS Most relapses occur within the first 3 months after quitting. Do not be discouraged if you start smoking again. Remember, most people try several times before finally quitting. You may have symptoms of withdrawal because your body is used to nicotine. You may crave cigarettes, be irritable, feel very hungry, cough often, get headaches, or have difficulty concentrating. The withdrawal symptoms are only temporary. They are  strongest when you first quit, but they will go away within 10-14 days. To reduce the chances of relapse, try to:  Avoid drinking alcohol. Drinking lowers your chances of successfully quitting.  Reduce the amount of caffeine you consume. Once you quit smoking, the amount of caffeine in your body increases and can give you symptoms, such as a rapid heartbeat, sweating, and anxiety.  Avoid smokers because they can make you want to smoke.  Do not let weight gain distract you. Many smokers will gain weight when they quit, usually less than 10 pounds. Eat a healthy diet and stay active. You can always lose the weight gained after you quit.  Find ways to improve your mood other than smoking. FOR MORE INFORMATION  www.smokefree.gov  Document Released: 12/21/2000 Document Revised: 05/13/2013 Document Reviewed: 04/07/2011 [facility] Patient Information 2015 [place], [place]. This information is not intended to replace advice given to you by your health care provider. Make sure you discuss any questions you have with your health care provider. Osteoporosis Throughout your life, your body breaks down old bone and replaces it with new bone. As you get older, your  body does not replace bone as quickly as it breaks it down. By the age of 61 years, most people begin to gradually lose bone because of the imbalance between bone loss and replacement. Some people lose more bone than others. Bone loss beyond a specified normal degree is considered osteoporosis.  Osteoporosis affects the strength and durability of your bones. The inside of the ends of your bones and your flat bones, like the bones of your pelvis, look like honeycomb, filled with tiny open spaces. As bone loss occurs, your bones become less dense. This means that the open spaces inside your bones become bigger and the walls between these spaces become thinner. This makes your bones weaker. Bones of a person with osteoporosis can become so weak that they  can break (fracture) during minor accidents, such as a simple fall. CAUSES  The following factors have been associated with the development of osteoporosis:  Smoking.  Drinking more than 2 alcoholic drinks several days per week.  Long-term use of certain medicines:  Corticosteroids.  Chemotherapy medicines.  Thyroid medicines.  Antiepileptic medicines.  Gonadal hormone suppression medicine.  Immunosuppression medicine.  Being underweight.  Lack of physical activity.  Lack of exposure to the sun. This can lead to vitamin D deficiency.  Certain medical conditions:  Certain inflammatory bowel diseases, such as Crohn disease and ulcerative colitis.  Diabetes.  Hyperthyroidism.  Hyperparathyroidism. RISK FACTORS Anyone can develop osteoporosis. However, the following factors can increase your risk of developing osteoporosis:  Gender--Women are at higher risk than men.  Age--Being older than 50 years increases your risk.  Ethnicity--White and Asian people have an increased risk.  Weight --Being extremely underweight can increase your risk of osteoporosis.  Family history of osteoporosis--Having a family member who has developed osteoporosis can increase your risk. SYMPTOMS  Usually, people with osteoporosis have no symptoms.  DIAGNOSIS  Signs during a physical exam that may prompt your caregiver to suspect osteoporosis include:  Decreased height. This is usually caused by the compression of the bones that form your spine (vertebrae) because they have weakened and become fractured.  A curving or rounding of the upper back (kyphosis). To confirm signs of osteoporosis, your caregiver may request a procedure that uses 2 low-dose X-ray beams with different levels of energy to measure your bone mineral density (dual-energy X-ray absorptiometry [DXA]). Also, your caregiver may check your level of vitamin D. TREATMENT  The goal of osteoporosis treatment is to strengthen  bones in order to decrease the risk of bone fractures. There are different types of medicines available to help achieve this goal. Some of these medicines work by slowing the processes of bone loss. Some medicines work by increasing bone density. Treatment also involves making sure that your levels of calcium and vitamin D are adequate. PREVENTION  There are things you can do to help prevent osteoporosis. Adequate intake of calcium and vitamin D can help you achieve optimal bone mineral density. Regular exercise can also help, especially resistance and weight-bearing activities. If you smoke, quitting smoking is an important part of osteoporosis prevention. MAKE SURE YOU:  Understand these instructions.  Will watch your condition.  Will get help right away if you are not doing well or get worse. FOR MORE INFORMATION www.osteo.org and [employer].com.ee Document Released: 10/06/2004 Document Revised: 04/23/2012 Document Reviewed: 12/11/2010 [facility] Patient Information 2015 [place], [place]. This information is not intended to replace advice given to you by your health care provider. Make sure you discuss any questions you have  with your health care provider. Tolterodine tablets What is this medicine? TOLTERODINE (tole TER a deen) is used to treat overactive bladder. This medicine reduces the amount of bathroom visits. It may also help to control wetting accidents. This medicine may be used for other purposes; ask your health care provider or pharmacist if you have questions. COMMON BRAND NAME(S): Detrol What should I tell my health care provider before I take this medicine? They need to know if you have any of these conditions: -difficulty passing urine -glaucoma -intestinal obstruction -irregular heartbeat or you have a family member with irregular heartbeat -kidney disease -liver disease -myasthenia gravis -an unusual or allergic reaction to tolterodine, fesoterodine, other medicines, foods,  dyes, or preservatives -pregnant or trying to get pregnant -breast-feeding How should I use this medicine? Take this medicine by mouth with a glass of water. Follow the directions on the prescription label. Take your doses at regular intervals. Do not take your medicine more often than directed. Talk to your pediatrician regarding the use of this medicine in children. Special care may be needed. Overdosage: If you think you have taken too much of this medicine contact a poison control center or emergency room at once. NOTE: This medicine is only for you. Do not share this medicine with others. What if I miss a dose? If you miss a dose, take it as soon as you can. If it is almost time for your next dose, take only that dose. Do not take double or extra doses. What may interact with this medicine? -clarithromycin -cyclosporine -erythromycin -fluoxetine -medicines for fungal infections, like fluconazole, itraconazole, ketoconazole or voriconazole -vinblastine This list may not describe all possible interactions. Give your health care provider a list of all the medicines, herbs, non-prescription drugs, or dietary supplements you use. Also tell them if you smoke, drink alcohol, or use illegal drugs. Some items may interact with your medicine. What should I watch for while using this medicine? It may take 2 or 3 months to notice the full benefit from this medicine. You may need to limit your intake tea, coffee, caffeinated sodas, and alcohol. These drinks may make your symptoms worse. You may get drowsy or dizzy. Do not drive, use machinery, or do anything that needs mental alertness until you know how this drug affects you. Do not stand or sit up quickly, especially if you are an older patient. This reduces the risk of dizzy or fainting spells. Your mouth may get dry. Chewing sugarless gum or sucking hard candy, and drinking plenty of water may help. Contact your doctor if the problem does not go  away or is severe. This medicine may cause dry eyes and blurred vision. If you wear contact lenses you may feel some discomfort. Lubricating drops may help. See your eye doctor if the problem does not go away or is severe. Avoid extreme heat. This medicine can cause you to sweat less than normal. Your body temperature could increase to dangerous levels, which may lead to heat stroke. What side effects may I notice from receiving this medicine? Side effects that you should report to your doctor or health care professional as soon as possible: -allergic reactions like skin rash, itching or hives, swelling of the face, lips, or tongue -breathing problems -confusion -difficulty passing urine -fast, irregular heartbeat -hallucinations -swelling in feet, hands Side effects that usually do not require medical attention (report to your doctor or health care professional if they continue or are bothersome): -changes in vision -constipation -dry  eyes, mouth -headache -dizziness, drowsiness -stomach upset This list may not describe all possible side effects. Call your doctor for medical advice about side effects. You may report side effects to FDA at 1-800-FDA-1088. Where should I keep my medicine? Keep out of the reach of children. Store at room temperature between 15 and 30 degrees C (59 and 86 degrees F). Throw away any unused medicine after the expiration date. NOTE: This sheet is a summary. It may not cover all possible information. If you have questions about this medicine, talk to your doctor, pharmacist, or health care provider.  2015, [employer]. (2009-10-06 17:19:08)

## 2014-03-03 ENCOUNTER — Encounter: Payer: Self-pay | Admitting: Family Medicine

## 2014-03-03 ENCOUNTER — Ambulatory Visit (INDEPENDENT_AMBULATORY_CARE_PROVIDER_SITE_OTHER): Payer: Self-pay | Admitting: Family Medicine

## 2014-03-03 VITALS — BP 120/84 | Temp 97.9°F | Wt 144.0 lb

## 2014-03-03 DIAGNOSIS — IMO0001 Reserved for inherently not codable concepts without codable children: Secondary | ICD-10-CM

## 2014-03-03 DIAGNOSIS — M609 Myositis, unspecified: Secondary | ICD-10-CM

## 2014-03-03 DIAGNOSIS — M791 Myalgia: Secondary | ICD-10-CM

## 2014-03-03 MED ORDER — TRAMADOL HCL 50 MG PO TABS: ORAL_TABLET | ORAL | Status: DC

## 2014-03-03 MED ORDER — CYCLOBENZAPRINE HCL 5 MG PO TABS: 5.0000 mg | ORAL_TABLET | Freq: Three times a day (TID) | ORAL | Status: DC | PRN

## 2014-03-03 MED ORDER — MELOXICAM 15 MG PO TABS: 15.0000 mg | ORAL_TABLET | Freq: Every day | ORAL | Status: DC

## 2014-03-03 NOTE — Progress Notes (Signed)
   Subjective:    Patient ID: Haley [name], female    DOB: 11-04-1945, 69 y.o.   MRN: [id number]  HPI There is a 69 year old female who comes in today for evaluation of back pain  She says she was lifting about a week ago and noticed some discomfort in her back. She said it extends from her shoulders down to her hips and into the back of her legs. She has a history of fibromyalgia.  The pain is dull aching not sharp. No neurologic symptoms   Review of Systems    review of systems otherwise negative Objective:   Physical Exam She's a well-developed well-nourished female somewhat difficult to get a clear history from.  Examination of back shows diffuse tenderness to light touch       Assessment & Plan:  Fibromyalgia...........Marland Kitchen Motrin 400 twice a day...Marland KitchenMarland KitchenMarland Kitchen  I spent 15 minutes explaining what I think was wrong and offered different therapies. The patient does not want to take Motrin. She says that she doesn't want to take tramadol as it makes her jittery. She wants an anti-inflammatory. I explained to her over and over that the anti-inflammatory is Motrin but she declined to take it. I think the best thing to do at this juncture is have her see Dr. Raliegh Ip for evaluation

## 2014-03-03 NOTE — Progress Notes (Signed)
Pre visit review using our clinic review tool, if applicable. No additional management support is needed unless otherwise documented below in the visit note. 

## 2014-03-03 NOTE — Patient Instructions (Addendum)
Motrin 400 mg twice daily with food...........Marland Kitchen since you have concerns about treatment options I think the best thing for you to do at this juncture is to see Dr. Raliegh Ip your personal physician

## 2014-03-04 ENCOUNTER — Telehealth: Payer: Self-pay | Admitting: Internal Medicine

## 2014-03-04 NOTE — Telephone Encounter (Signed)
emmi mailed  °

## 2014-03-05 ENCOUNTER — Telehealth: Payer: Self-pay | Admitting: Cardiovascular Disease

## 2014-03-06 NOTE — Telephone Encounter (Signed)
Close encounter 

## 2014-03-20 ENCOUNTER — Ambulatory Visit: Payer: Self-pay | Admitting: Cardiovascular Disease

## 2014-04-25 ENCOUNTER — Ambulatory Visit (INDEPENDENT_AMBULATORY_CARE_PROVIDER_SITE_OTHER): Payer: PPO | Admitting: Cardiovascular Disease

## 2014-04-25 ENCOUNTER — Encounter: Payer: Self-pay | Admitting: Cardiovascular Disease

## 2014-04-25 VITALS — BP 124/80 | HR 54 | Ht 62.0 in | Wt 144.0 lb

## 2014-04-25 DIAGNOSIS — R002 Palpitations: Secondary | ICD-10-CM

## 2014-04-25 DIAGNOSIS — E785 Hyperlipidemia, unspecified: Secondary | ICD-10-CM

## 2014-04-25 DIAGNOSIS — I480 Paroxysmal atrial fibrillation: Secondary | ICD-10-CM | POA: Diagnosis not present

## 2014-04-25 DIAGNOSIS — E039 Hypothyroidism, unspecified: Secondary | ICD-10-CM

## 2014-04-25 DIAGNOSIS — M810 Age-related osteoporosis without current pathological fracture: Secondary | ICD-10-CM

## 2014-04-25 LAB — LIPID PANEL
CHOL/HDL RATIO: 3.4 ratio
Cholesterol: 151 mg/dL (ref 0–200)
HDL: 44 mg/dL — AB (ref >=46)
LDL Cholesterol: 63 mg/dL (ref 0–99)
TRIGLYCERIDES: 218 mg/dL — AB (ref <150)
VLDL: 44 mg/dL — ABNORMAL HIGH (ref 0–40)

## 2014-04-25 LAB — COMPREHENSIVE METABOLIC PANEL
ALK PHOS: 75 U/L (ref 39–117)
ALT: 20 U/L (ref 0–35)
AST: 22 U/L (ref 0–37)
Albumin: 3.7 g/dL (ref 3.5–5.2)
BUN: 11 mg/dL (ref 6–23)
CO2: 27 mEq/L (ref 19–32)
Calcium: 9 mg/dL (ref 8.4–10.5)
Chloride: 104 mEq/L (ref 96–112)
Creat: 0.66 mg/dL (ref 0.50–1.10)
GLUCOSE: 86 mg/dL (ref 70–99)
POTASSIUM: 3.7 meq/L (ref 3.5–5.3)
Sodium: 139 mEq/L (ref 135–145)
TOTAL PROTEIN: 6.5 g/dL (ref 6.0–8.3)
Total Bilirubin: 0.3 mg/dL (ref 0.2–1.2)

## 2014-04-25 LAB — CBC
HEMATOCRIT: 39.1 % (ref 36.0–46.0)
HEMOGLOBIN: 13 g/dL (ref 12.0–15.0)
MCH: 29.1 pg (ref 26.0–34.0)
MCHC: 33.2 g/dL (ref 30.0–36.0)
MCV: 87.5 fL (ref 78.0–100.0)
MPV: 9.7 fL (ref 8.6–12.4)
Platelets: 243 10*3/uL (ref 150–400)
RBC: 4.47 MIL/uL (ref 3.87–5.11)
RDW: 14 % (ref 11.5–15.5)
WBC: 7.9 10*3/uL (ref 4.0–10.5)

## 2014-04-25 LAB — TSH: TSH: 0.787 u[IU]/mL (ref 0.350–4.500)

## 2014-04-25 NOTE — Patient Instructions (Signed)
Your physician recommends that you return for lab work fasting.  Your physician wants you to follow-up in: 6 months or sooner if needed with Dr. Kelly. You will receive a reminder letter in the mail two months in advance. If you don't receive a letter, please call our office to schedule the follow-up appointment. 

## 2014-04-25 NOTE — Progress Notes (Signed)
Patient ID: Haley [name], female   DOB: 1945-03-28, 69 y.o.   MRN: [id number]     HPI: Haley [name] is a 69 y.o. female presents to the office today for a 7 month followup evaluation.  Ms. Common  has a history of palpitations, paroxysmal atrial fibrillation, hyperlipidemia, GERD, and hypothyroidism. She has been found to have an atherogenic dyslipidemia lipid panel. She also has a history of lower extremity varicosities and wears support stockings. Remotely she was having difficulty remembering to take her metoprolol on a twice a day regimen at that time I switched her to metoprolol succinate Toprol-XL 25 mg daily. She has felt markedly improved with reference to taking this once a day preparation. She is unaware of any breakthrough tachycardia palpitations. She denies presyncope or syncope. An NMR lipoprofile  showed marked improvement with a cholesterol of 174, triglycerides 119, HDL C. cholesterol 60, and LDL cholesterol at 90. Her LDL particle number was 1020 and her HDL particle number was 39.5. Her insulin resistance score was 39 which was normal.   Laboratory last year showed a total cholesterol 165, triglycerides 100, HDL 54, LDL 91.  TSH was 2.03.  She was not anemic.  She normal chemistry profile.  Over the past 6 months, she is unaware of any recurrent palpitations.  She does note occasional left leg discomfort.  She does have hypothyroidism and is on Synthroid replacement therapy 75 mcg.  She is tolerating her simvastatin without myalgias.  She does take Fosamax for osteoporosis.  He vitamin D level drawn in October 2016 was normal at 42.  Past Medical History  Diagnosis Date  . Thyroid disease   . OA (osteoarthritis)   . MVP (mitral valve prolapse)     mild and MR  . Fibromyalgia   . Hemorrhoids     internal  . Paroxysmal a-fib   . Hyperlipidemia   . GERD (gastroesophageal reflux disease)   . Varicosities of leg   . H/O echocardiogram 04/28/08    EF>55% trace mitral  regurgitation, No significant valvular pathology  . History of stress test 02/08/2011    Normal Myocardial perfusion study, this is a low risk scan, No prior study available for comparison  . Osteoporosis     Past Surgical History  Procedure Laterality Date  . Abdominal hysterectomy  1989  . Nose surgery  1990  . Colonoscopy  11-20-2000  . Appendectomy  1989    with hysterectomy    Allergies  Allergen Reactions  . Tramadol Hcl     REACTION: jittery    Current Outpatient Prescriptions  Medication Sig Dispense Refill  . alendronate (FOSAMAX) 70 MG tablet Take 1 tablet (70 mg total) by mouth every 7 (seven) days. 12 tablet 3  . ALPRAZolam (XANAX) 0.5 MG tablet TAKE ONE TABLET BY MOUTH ONCE DAILY AS NEEDED 30 tablet 2  . aspirin 81 MG tablet Take 81 mg by mouth daily.      . Calcium Carbonate (CALTRATE 600) 1500 MG TABS Take by mouth.      . cholecalciferol (VITAMIN D) 1000 UNITS tablet Take 1,000 Units by mouth daily.     . clobetasol cream (TEMOVATE) 0.05 % Apply twice a week first week then once weekly as needed 30 g 2  . fluticasone (CUTIVATE) 0.05 % cream Apply 1 application topically 2 (two) times daily.     . fluticasone (FLONASE) 50 MCG/ACT nasal spray Place 2 sprays into both nostrils daily. 48 g 3  . levothyroxine (SYNTHROID,  LEVOTHROID) 75 MCG tablet Take 1 tablet (75 mcg total) by mouth daily. 90 tablet 3  . metoprolol succinate (TOPROL-XL) 25 MG 24 hr tablet TAKE 1 BY MOUTH DAILY 90 tablet 2  . Multiple Vitamins-Minerals (MULTIVITAMIN WITH MINERALS) tablet Take 1 tablet by mouth daily.      . Omega-3 Fatty Acids (FISH OIL) 1000 MG CAPS Take 1 capsule by mouth daily.    . pantoprazole (PROTONIX) 40 MG tablet TAKE 1 BY MOUTH DAILY 90 tablet 0  . simvastatin (ZOCOR) 20 MG tablet Take 1 tablet (20 mg total) by mouth at bedtime. 90 tablet 3  . tretinoin (RETIN-A) 0.025 % cream Apply topically at bedtime.     . vitamin C (ASCORBIC ACID) 500 MG tablet Take 500 mg by mouth  daily.       No current facility-administered medications for this visit.    Socially she is married and has 2 children and 4 grandchildren with one great grandchild. She smokes little cigars. [facility].  ROS General: Negative; No fevers, chills, or night sweats;  HEENT: Negative; No changes in vision or hearing, sinus congestion, difficulty swallowing Pulmonary: Negative; No cough, wheezing, shortness of breath, hemoptysis Cardiovascular: Negative; No chest pain, presyncope, syncope, palpitations GI: Negative; No nausea, vomiting, diarrhea, or abdominal pain GU: Negative; No dysuria, hematuria, or difficulty voiding Musculoskeletal: Occasional left leg discomfort no myalgias, joint pain, or weakness Hematologic/Oncology: Negative; no easy bruising, bleeding Endocrine: Positive for hypothyroidism, on Synthroid e; no diabetes Neuro: Negative; no changes in balance, headaches Skin: Negative; No rashes or skin lesions Psychiatric: Negative; No behavioral problems, depression Sleep: Negative; No snoring, daytime sleepiness, hypersomnolence, bruxism, restless legs, hypnogognic hallucinations, no cataplexy Other comprehensive 14 point system review is negative.   PE BP 124/80 mmHg  Ht _0  (1.575 m)  Wt 144 lb (65.318 kg)  BMI 26.33 kg/m2  General: Alert, oriented, no distress.  HEENT: Normocephalic, atraumatic. Pupils round and reactive; sclera anicteric; no xanthelasmas Nose without nasal septal hypertrophy Mouth/Parynx benign; Mallinpatti scale 2 Neck: No JVD, no carotid bruits with normal carotid upstroke Chest wall: Nontender to palpation Lungs: clear to ausculatation and percussion; no wheezing or rales Heart: RRR, s1 s2 normal 1/6 SEM, no diastolic murmur. No S3 or S4 gallop. No rubs thrills or heaves. Abdomen: soft, nontender; no hepatosplenomehaly, BS+; abdominal aorta nontender and not dilated by palpation. Back: No CVA  tenderness Pulses 2+ Extremities:no clubbinbg cyanosis or edema, Homan's sign negative  Neurologic: grossly nonfocal Psychological: Normal affect and mood  ECG (independently read by me): Sinus bradycardia at 54 bpm.  Normal intervals.  No significant ST segment changes  September 2015 ECG (independently read by me): Sinus bradycardia 52 beats per minute.  QS complex in V1 and V2.  Nonspecific ST change.  QTc interval 398 ms  Prior March 2015 ECG (independently read by me): Sinus bradycardia 51 beats per minute. No ectopy. Normal intervals.  Prior 09/14/2012 ECG: Sinus rhythm at 53 beats per minute. She has poor anterior R-wave progression. Normal intervals  LABS:  BMET  BMP Latest Ref Rng 08/13/2013 12/16/2010 09/10/2009  Glucose 70 - 99 mg/dL 75 107(H) 78  BUN 6 - 23 mg/dL _1 Creatinine 0.4 - 1.2 mg/dL 0.8 0.8 0.7  Sodium 135 - 145 mEq/L 141 143 143  Potassium 3.5 - 5.1 mEq/L 3.9 4.5 4.2  Chloride 96 - 112 mEq/L 105 109 105  CO2 19 - 32 mEq/L 28  29 31  Calcium 8.4 - 10.5 mg/dL 9.3 9.0 9.2     Hepatic Function Panel   Hepatic Function Latest Ref Rng 08/13/2013 12/16/2010 09/10/2009  Total Protein 6.0 - 8.3 g/dL 6.8 6.6 6.4  Albumin 3.5 - 5.2 g/dL 3.9 3.7 3.9  AST 0 - 37 U/L _0 ALT 0 - 35 U/L _1 Alk Phosphatase 39 - 117 U/L 59 69 56  Total Bilirubin 0.2 - 1.2 mg/dL 0.9 0.3 0.7  Bilirubin, Direct 0.0 - 0.3 mg/dL - 0.0 0.1     CBC  CBC Latest Ref Rng 08/13/2013 12/16/2010 09/10/2009  WBC 4.0 - 10.5 K/uL 8.3 7.6 8.5  Hemoglobin 12.0 - 15.0 g/dL 13.7 13.5 13.5  Hematocrit 36.0 - 46.0 % 41.0 39.9 40.4  Platelets 150.0 - 400.0 K/uL 187.0 197.0 187.0   Lab Results  Component Value Date   TSH 2.03 08/13/2013    BNP No results found for: PROBNP  Lipid Panel     Component Value Date/Time   CHOL 165 08/13/2013 0935   TRIG 100.0 08/13/2013 0935   HDL 54.40 08/13/2013 0935   CHOLHDL 3 08/13/2013 0935   VLDL 20.0 08/13/2013 0935   LDLCALC 91 08/13/2013 0935       RADIOLOGY: No results found.    ASSESSMENT AND PLAN: Ms. Brendel is a 69 year old female who has a history of palpitations that have improved with once a day long-acting beta blocker therapy.  She is maintaining sinus rhythm and is asymptomatic with reference to to being mildly bradycardic.  She denies dizziness or orthostatic symptoms.  She has normal systolic and diastolic  Function with mild aortic sclerosis on echocardiography.  A nuclear perfusion study  in January 2013 showed normal perfusion without scar or ischemia and she had a post stress ejection fraction of 75%.  Presently, she is not having any chest pain.  She feels significantly improved with once a day.  Toprol.  She is fasting today and I will check follow-up laboratory in the fasting state.  Her body mass index today is 26.3.  I have recommended continued exercise at least 5 days a week for minimum of 30 minutes at a time of moderate intensity.  I have suggested that I will see her back in one year for follow-up evaluation but she prefers to see me at six-month intervals.  Time spent: 25 minutes  Troy Sine, MD, [facility]  04/25/2014 8:13 AM

## 2014-05-29 ENCOUNTER — Telehealth: Payer: Self-pay | Admitting: *Deleted

## 2014-05-29 ENCOUNTER — Other Ambulatory Visit: Payer: Self-pay | Admitting: *Deleted

## 2014-05-29 NOTE — Telephone Encounter (Signed)
-----   Message from Troy Sine, MD sent at 05/29/2014  8:53 AM EDT ----- Labs better; TG still high; inc fish oil to 2 cap bid

## 2014-06-03 ENCOUNTER — Telehealth: Payer: Self-pay | Admitting: Gastroenterology

## 2014-06-03 ENCOUNTER — Other Ambulatory Visit: Payer: Self-pay | Admitting: Cardiovascular Disease

## 2014-06-03 ENCOUNTER — Telehealth: Payer: Self-pay | Admitting: Internal Medicine

## 2014-06-03 ENCOUNTER — Telehealth: Payer: Self-pay | Admitting: *Deleted

## 2014-06-03 MED ORDER — ALENDRONATE SODIUM 70 MG PO TABS: 70.0000 mg | ORAL_TABLET | ORAL | Status: DC

## 2014-06-03 MED ORDER — ALPRAZOLAM 0.5 MG PO TABS: ORAL_TABLET | ORAL | Status: DC

## 2014-06-03 MED ORDER — LEVOTHYROXINE SODIUM 75 MCG PO TABS: 75.0000 ug | ORAL_TABLET | Freq: Every day | ORAL | Status: DC

## 2014-06-03 MED ORDER — METOPROLOL SUCCINATE ER 25 MG PO TB24: 25.0000 mg | ORAL_TABLET | Freq: Every day | ORAL | Status: DC

## 2014-06-03 MED ORDER — FLUTICASONE PROPIONATE 50 MCG/ACT NA SUSP: 2.0000 | Freq: Every day | NASAL | Status: DC

## 2014-06-03 MED ORDER — SIMVASTATIN 20 MG PO TABS: 20.0000 mg | ORAL_TABLET | Freq: Every day | ORAL | Status: DC

## 2014-06-03 MED ORDER — CLOBETASOL PROPIONATE 0.05 % EX CREA: TOPICAL_CREAM | CUTANEOUS | Status: DC

## 2014-06-03 NOTE — Telephone Encounter (Signed)
Pt called requesting 3 tablets on fosamax sent to local [facility] because her mail order Rx will not be there in time. Rx will need to be sent to mail order as well for fosamax and clobetasol cream

## 2014-06-03 NOTE — Telephone Encounter (Signed)
Spoke to pt, told her I will send Rx's for Flonase, Levothyroxine and Alprazolam to the pharmacy as requested. Told pt she will need to contact Cardiology and other providers for refills that are not prescribed by Dr.K. Pt verbalized understanding.

## 2014-06-03 NOTE — Telephone Encounter (Signed)
Pt call and said she will now be using [facility] for her prescriptions for all her medicine. She said they are asking  that all her rx be faxed over to the following number.    Fax  [phone] Phone number [phone]   She is asking if the following med can be called in to [facility] alendronate (FOSAMAX) 70 MG tablet

## 2014-06-03 NOTE — Telephone Encounter (Signed)
Left message for patient informing that refill request submitted to pharmacy.

## 2014-06-03 NOTE — Telephone Encounter (Signed)
°  1. Which medications need to be refilled? Metoprolol and Simvastatin  2. Which pharmacy is medication to be sent to?[facility] ( fax#- [phone])  3. Do they need a 30 day or 90 day supply? 90 days   4. Would they like a call back once the medication has been sent to the pharmacy? Yes

## 2014-06-04 MED ORDER — PANTOPRAZOLE SODIUM 40 MG PO TBEC: 40.0000 mg | DELAYED_RELEASE_TABLET | Freq: Every day | ORAL | Status: DC

## 2014-06-04 NOTE — Telephone Encounter (Signed)
Faxed Protonix to number provided 90 day supply  L/M for patient that med was faxed

## 2014-06-05 ENCOUNTER — Encounter: Payer: Self-pay | Admitting: Internal Medicine

## 2014-06-05 ENCOUNTER — Ambulatory Visit (INDEPENDENT_AMBULATORY_CARE_PROVIDER_SITE_OTHER): Payer: PPO | Admitting: Internal Medicine

## 2014-06-05 VITALS — BP 110/70 | HR 55 | Temp 98.0°F | Resp 18 | Ht 62.0 in | Wt 145.0 lb

## 2014-06-05 DIAGNOSIS — E785 Hyperlipidemia, unspecified: Secondary | ICD-10-CM | POA: Diagnosis not present

## 2014-06-05 DIAGNOSIS — F17200 Nicotine dependence, unspecified, uncomplicated: Secondary | ICD-10-CM

## 2014-06-05 DIAGNOSIS — Z720 Tobacco use: Secondary | ICD-10-CM

## 2014-06-05 DIAGNOSIS — F419 Anxiety disorder, unspecified: Secondary | ICD-10-CM

## 2014-06-05 NOTE — Progress Notes (Signed)
Pre visit review using our clinic review tool, if applicable. No additional management support is needed unless otherwise documented below in the visit note. 

## 2014-06-05 NOTE — Progress Notes (Signed)
Subjective:    Patient ID: Haley [name], female    DOB: 05/06/1945, 69 y.o.   MRN: [id number]  HPI  69 year old patient who has a history of dyslipidemia.  She was quite concerned about a modest elevation of her most recent triglyceride level.  She has been using fish oil per cardiology recommendation.  Remains on statin therapy with a very well-controlled LDL cholesterol. She also complains of halitosis.  No dental exam in a number of years.  She continues to smoke.  No sinus symptoms  Past Medical History  Diagnosis Date  . Thyroid disease   . OA (osteoarthritis)   . MVP (mitral valve prolapse)     mild and MR  . Fibromyalgia   . Hemorrhoids     internal  . Paroxysmal a-fib   . Hyperlipidemia   . GERD (gastroesophageal reflux disease)   . Varicosities of leg   . H/O echocardiogram 04/28/08    EF>55% trace mitral regurgitation, No significant valvular pathology  . History of stress test 02/08/2011    Normal Myocardial perfusion study, this is a low risk scan, No prior study available for comparison  . Osteoporosis     History   Social History  . Marital Status: Married    Spouse Name: N/A  . Number of Children: N/A  . Years of Education: N/A   Occupational History  . Not on file.   Social History Main Topics  . Smoking status: Current Some Day Smoker    Types: Cigars  . Smokeless tobacco: Never Used     Comment: peach flavor   . Alcohol Use: No  . Drug Use: No  . Sexual Activity: Not on file   Other Topics Concern  . Not on file   Social History Narrative    Past Surgical History  Procedure Laterality Date  . Abdominal hysterectomy  1989  . Nose surgery  1990  . Colonoscopy  11-20-2000  . Appendectomy  1989    with hysterectomy    Family History  Problem Relation Age of Onset  . Diabetes Daughter   . Diabetes Mother   . Hypertension Mother   . Stroke Maternal Grandmother   . Colon cancer Neg Hx     Allergies  Allergen Reactions  .  Tramadol Hcl     REACTION: jittery    Current Outpatient Prescriptions on File Prior to Visit  Medication Sig Dispense Refill  . alendronate (FOSAMAX) 70 MG tablet Take 1 tablet (70 mg total) by mouth every 7 (seven) days. 12 tablet 1  . ALPRAZolam (XANAX) 0.5 MG tablet TAKE ONE TABLET BY MOUTH ONCE DAILY AS NEEDED 90 tablet 1  . aspirin 81 MG tablet Take 81 mg by mouth daily.      . Calcium Carbonate (CALTRATE 600) 1500 MG TABS Take by mouth.      . cholecalciferol (VITAMIN D) 1000 UNITS tablet Take 1,000 Units by mouth daily.     . clobetasol cream (TEMOVATE) 0.05 % Apply twice a week first week then once weekly as needed 30 g 2  . fluticasone (CUTIVATE) 0.05 % cream Apply 1 application topically 2 (two) times daily.     . fluticasone (FLONASE) 50 MCG/ACT nasal spray Place 2 sprays into both nostrils daily. 48 g 3  . levothyroxine (SYNTHROID, LEVOTHROID) 75 MCG tablet Take 1 tablet (75 mcg total) by mouth daily. 90 tablet 1  . metoprolol succinate (TOPROL-XL) 25 MG 24 hr tablet Take 1 tablet (25 mg total)  by mouth daily. 90 tablet 3  . Multiple Vitamins-Minerals (MULTIVITAMIN WITH MINERALS) tablet Take 1 tablet by mouth daily.      . Omega-3 Fatty Acids (FISH OIL) 1000 MG CAPS Take 2 capsules by mouth 2 (two) times daily.    . pantoprazole (PROTONIX) 40 MG tablet Take 1 tablet (40 mg total) by mouth daily. 90 tablet 3  . simvastatin (ZOCOR) 20 MG tablet Take 1 tablet (20 mg total) by mouth at bedtime. 90 tablet 3  . tretinoin (RETIN-A) 0.025 % cream Apply topically at bedtime.     . vitamin C (ASCORBIC ACID) 500 MG tablet Take 500 mg by mouth daily.       No current facility-administered medications on file prior to visit.    BP 110/70 mmHg  Pulse 55  Temp(Src) 98 F (36.7 C) (Oral)  Resp 18  Ht 5\' 2"  (1.575 m)  Wt 145 lb (65.772 kg)  BMI 26.51 kg/m2  SpO2 98%     Review of Systems  Constitutional: Negative.   HENT: Negative for congestion, dental problem, hearing loss,  rhinorrhea, sinus pressure, sore throat and tinnitus.   Eyes: Negative for pain, discharge and visual disturbance.  Respiratory: Negative for cough and shortness of breath.   Cardiovascular: Negative for chest pain, palpitations and leg swelling.  Gastrointestinal: Negative for nausea, vomiting, abdominal pain, diarrhea, constipation, blood in stool and abdominal distention.  Genitourinary: Negative for dysuria, urgency, frequency, hematuria, flank pain, vaginal bleeding, vaginal discharge, difficulty urinating, vaginal pain and pelvic pain.  Musculoskeletal: Negative for joint swelling, arthralgias and gait problem.  Skin: Negative for rash.  Neurological: Negative for dizziness, syncope, speech difficulty, weakness, numbness and headaches.  Hematological: Negative for adenopathy.  Psychiatric/Behavioral: Negative for behavioral problems, dysphoric mood and agitation. The patient is not nervous/anxious.        Objective:   Physical Exam  Constitutional: She is oriented to person, place, and time. She appears well-developed and well-nourished.  HENT:  Head: Normocephalic.  Right Ear: External ear normal.  Left Ear: External ear normal.  Mouth/Throat: Oropharynx is clear and moist.  Eyes: Conjunctivae and EOM are normal. Pupils are equal, round, and reactive to light.  Neck: Normal range of motion. Neck supple. No thyromegaly present.  Cardiovascular: Normal rate, regular rhythm, normal heart sounds and intact distal pulses.   Pulmonary/Chest: Effort normal and breath sounds normal.  Abdominal: Soft. Bowel sounds are normal. She exhibits no mass. There is no tenderness.  Musculoskeletal: Normal range of motion.  Lymphadenopathy:    She has no cervical adenopathy.  Neurological: She is alert and oriented to person, place, and time.  Skin: Skin is warm and dry. No rash noted.  Psychiatric: She has a normal mood and affect. Her behavior is normal.          Assessment & Plan:    Dyslipidemia.  Recent mild hypertriglyceridemia.  Patient started on fish oil.  We'll continue statin therapy.  LDL at goal.  Patient reassured Tobacco use disorder.  Total smoking cessation encouraged Halitosis.  Smoking cessation encouraged.  Dental exam and cleaning recommended

## 2014-06-05 NOTE — Patient Instructions (Signed)
Smoking tobacco is very bad for your health. You should stop smoking immediately.  Dental examination and cleaning  Return in 6 months for follow-up

## 2014-06-06 ENCOUNTER — Other Ambulatory Visit: Payer: Self-pay | Admitting: Internal Medicine

## 2014-06-13 ENCOUNTER — Other Ambulatory Visit: Payer: Self-pay | Admitting: Internal Medicine

## 2014-06-17 ENCOUNTER — Other Ambulatory Visit: Payer: Self-pay | Admitting: Gynecology

## 2014-07-07 ENCOUNTER — Telehealth: Payer: Self-pay | Admitting: Internal Medicine

## 2014-07-07 DIAGNOSIS — K219 Gastro-esophageal reflux disease without esophagitis: Secondary | ICD-10-CM

## 2014-07-07 NOTE — Telephone Encounter (Signed)
Okay to send referral to [facility] for pt?

## 2014-07-07 NOTE — Telephone Encounter (Signed)
ok 

## 2014-07-07 NOTE — Telephone Encounter (Signed)
Pt call to ask for a referral to see Dr Erskine Emery. Pt said she has issues and would like to find out what they are.

## 2014-07-07 NOTE — Telephone Encounter (Signed)
Order for referral to GI was done.

## 2014-07-15 ENCOUNTER — Ambulatory Visit (INDEPENDENT_AMBULATORY_CARE_PROVIDER_SITE_OTHER): Payer: PPO | Admitting: Adult Health

## 2014-07-15 ENCOUNTER — Encounter: Payer: Self-pay | Admitting: Adult Health

## 2014-07-15 VITALS — BP 134/76 | Temp 98.7°F | Ht 62.0 in | Wt 144.7 lb

## 2014-07-15 DIAGNOSIS — T63444A Toxic effect of venom of bees, undetermined, initial encounter: Secondary | ICD-10-CM

## 2014-07-15 NOTE — Patient Instructions (Addendum)
Your lip does not look infected. Continue to put ice on it and take 600 mg Ibupfrofen. This will help with the pain and inflammation.   You can put hydrocortisone cream on your arms to help those areas that were stung heal.

## 2014-07-15 NOTE — Progress Notes (Signed)
Subjective:    Patient ID: Haley [name], female    DOB: February 23, 1945, 69 y.o.   MRN: [id number]  HPI  Haley [name] presents to the office today s/p multiple bee stings on Friday. She has multiple areas of being stung on her right and left arm. She is more worried about the area on her right upper lip where she was stung as well. She has a small blister on the upper lip. She states " yesterday, I felt my some discharge coming out of it." She is worried about it being infected. She also complains of pain in her lip from the bee sting. She has been placing ice pacts on the area.   Denies any fevers.   Review of Systems  Constitutional: Negative.   HENT: Negative.   Cardiovascular: Negative.   Musculoskeletal: Negative.   Skin: Positive for wound.  All other systems reviewed and are negative.  Past Medical History  Diagnosis Date  . Thyroid disease   . OA (osteoarthritis)   . MVP (mitral valve prolapse)     mild and MR  . Fibromyalgia   . Hemorrhoids     internal  . Paroxysmal a-fib   . Hyperlipidemia   . GERD (gastroesophageal reflux disease)   . Varicosities of leg   . H/O echocardiogram 04/28/08    EF>55% trace mitral regurgitation, No significant valvular pathology  . History of stress test 02/08/2011    Normal Myocardial perfusion study, this is a low risk scan, No prior study available for comparison  . Osteoporosis     History   Social History  . Marital Status: Married    Spouse Name: N/A  . Number of Children: N/A  . Years of Education: N/A   Occupational History  . Not on file.   Social History Main Topics  . Smoking status: Current Some Day Smoker    Types: Cigars  . Smokeless tobacco: Never Used     Comment: peach flavor   . Alcohol Use: No  . Drug Use: No  . Sexual Activity: Not on file   Other Topics Concern  . Not on file   Social History Narrative    Past Surgical History  Procedure Laterality Date  . Abdominal hysterectomy  1989  . Nose  surgery  1990  . Colonoscopy  11-20-2000  . Appendectomy  1989    with hysterectomy    Family History  Problem Relation Age of Onset  . Diabetes Daughter   . Diabetes Mother   . Hypertension Mother   . Stroke Maternal Grandmother   . Colon cancer Neg Hx     Allergies  Allergen Reactions  . Tramadol Hcl     REACTION: jittery    Current Outpatient Prescriptions on File Prior to Visit  Medication Sig Dispense Refill  . alendronate (FOSAMAX) 70 MG tablet Take 1 tablet by mouth every 7 days. 12 tablet 1  . ALPRAZolam (XANAX) 0.5 MG tablet TAKE ONE TABLET BY MOUTH ONCE DAILY AS NEEDED 90 tablet 1  . aspirin 81 MG tablet Take 81 mg by mouth daily.      . Calcium Carbonate (CALTRATE 600) 1500 MG TABS Take by mouth.      . cholecalciferol (VITAMIN D) 1000 UNITS tablet Take 1,000 Units by mouth daily.     . clobetasol cream (TEMOVATE) 0.05 % Apply twice a week first week then once weekly as needed 30 g 2  . fluticasone (CUTIVATE) 0.05 % cream Apply 1 application  topically 2 (two) times daily.     . fluticasone (FLONASE) 50 MCG/ACT nasal spray Place 2 sprays into both nostrils daily. 48 g 3  . levothyroxine (SYNTHROID, LEVOTHROID) 75 MCG tablet Take 1 tablet (75 mcg total) by mouth daily. 90 tablet 1  . metoprolol succinate (TOPROL-XL) 25 MG 24 hr tablet Take 1 tablet (25 mg total) by mouth daily. 90 tablet 3  . Multiple Vitamins-Minerals (MULTIVITAMIN WITH MINERALS) tablet Take 1 tablet by mouth daily.      . Omega-3 Fatty Acids (FISH OIL) 1000 MG CAPS Take 2 capsules by mouth 2 (two) times daily.    . pantoprazole (PROTONIX) 40 MG tablet Take 1 tablet (40 mg total) by mouth daily. 90 tablet 3  . simvastatin (ZOCOR) 20 MG tablet Take 1 tablet (20 mg total) by mouth at bedtime. 90 tablet 3  . tretinoin (RETIN-A) 0.025 % cream Apply topically at bedtime.     . vitamin C (ASCORBIC ACID) 500 MG tablet Take 500 mg by mouth daily.       No current facility-administered medications on file  prior to visit.    BP 134/76 mmHg  Temp(Src) 98.7 F (37.1 C) (Oral)  Ht 5\' 2"  (1.575 m)  Wt 144 lb 11.2 oz (65.635 kg)  BMI 26.46 kg/m2       Objective:   Physical Exam  Constitutional: She is oriented to person, place, and time. She appears well-developed and well-nourished. No distress.  HENT:  Small well healing blister on right upper lip. There are no signs or symptoms of infection. No drainage, no redness and only trace swelling  Neurological: She is alert and oriented to person, place, and time.  Skin: Skin is warm and dry. No rash noted. She is not diaphoretic. No erythema. No pallor.  Multiple bee sting marks on upper extremities. They appear well healing.   Psychiatric: She has a normal mood and affect. Her behavior is normal. Judgment and thought content normal.  Nursing note and vitals reviewed.     Assessment & Plan:  1. Bee sting reaction, undetermined intent, initial encounter - Does not appear infected, does not need ABX at this time.  - Continue to use ice as needed - Take 600mg  Ibuprofen every 6 hours as needed - Follow up if not any better in 2-3 days.

## 2014-07-15 NOTE — Progress Notes (Signed)
Pre visit review using our clinic review tool, if applicable. No additional management support is needed unless otherwise documented below in the visit note. 

## 2014-07-22 ENCOUNTER — Encounter: Payer: Self-pay | Admitting: Gastroenterology

## 2014-07-22 ENCOUNTER — Ambulatory Visit (INDEPENDENT_AMBULATORY_CARE_PROVIDER_SITE_OTHER): Payer: PPO | Admitting: Gastroenterology

## 2014-07-22 VITALS — BP 120/70 | HR 71 | Ht 62.0 in | Wt 148.2 lb

## 2014-07-22 DIAGNOSIS — R1013 Epigastric pain: Secondary | ICD-10-CM | POA: Insufficient documentation

## 2014-07-22 DIAGNOSIS — K219 Gastro-esophageal reflux disease without esophagitis: Secondary | ICD-10-CM

## 2014-07-22 DIAGNOSIS — R196 Halitosis: Secondary | ICD-10-CM | POA: Diagnosis not present

## 2014-07-22 MED ORDER — PANTOPRAZOLE SODIUM 40 MG PO TBEC: 40.0000 mg | DELAYED_RELEASE_TABLET | Freq: Two times a day (BID) | ORAL | Status: DC

## 2014-07-22 NOTE — Progress Notes (Signed)
     07/22/2014 Haley [name] [id number] 10/26/1945   History of Present Illness:  This is a 69 year old female who is known to Dr. Deatra Ina.  She has long-standing reflux issues and has been on pantoprazole 40 mg daily for some time.  She presents to our office today with the main complaint of bad breath.  She said that despite good dental hygiene this has been persistent for the past year or so.  She says that her GERD symptoms are much improved from what they used to be, but sometimes she still gets severe reflux and epigastric pain after just a couple sips of coffee, etc.  She also reports that a lot of her stools are an orange color, but she denies any issues with constipation or diarrhea.  She is convinced that she has a "bacteria" in her stomach or intestines that is causing these issues and wants to figure out what is going on.  Last EGD was in 1998 for complaints of dysphagia and esophagus was dilated.  No recurrent complaints of dysphagia at this time.    Colonoscopy 10/2012 showed only mild diverticulosis and internal hemorrhoids.    Current Medications, Allergies, Past Medical History, Past Surgical History, Family History and Social History were reviewed in [employer] record.   Physical Exam: BP 120/70 mmHg  Pulse 71  Ht 5\' 2"  (1.575 m)  Wt 148 lb 3.2 oz (67.223 kg)  BMI 27.10 kg/m2  SpO2 98% General: Well developed female in no acute distress Head: Normocephalic and atraumatic Eyes:  Sclerae anicteric, conjunctiva pink  Ears: Normal auditory acuity Lungs: Clear throughout to auscultation Heart: Regular rate and rhythm Abdomen: Soft, non-distended.  Normal bowel sounds.  Non-tender. Musculoskeletal: Symmetrical with no gross deformities  Extremities: No edema  Neurological: Alert oriented x 4, grossly non-focal Psychological:  Alert and cooperative. Normal mood and affect  Assessment and Recommendations: -Chronic GERD and epigastric abdominal  pain:  Still present despite pantoprazole 40 mg daily.  Also complaining of bad breath ? Due to reflux.  She thinks that she has some type of "bacteria" in her gut that is causing her bad breath.  Will schedule EGD with Dr. Deatra Ina for further evaluation.  The risks, benefits, and alternatives were discussed with the patient and she consents to proceed.  In the interim she will increase her pantoprazole to 40 mg BID.

## 2014-07-22 NOTE — Patient Instructions (Signed)
You have been scheduled for an endoscopy. Please follow written instructions given to you at your visit today. If you use inhalers (even only as needed), please bring them with you on the day of your procedure. Your physician has requested that you go to www.startemmi.com and enter the access code given to you at your visit today. This web site gives a general overview about your procedure. However, you should still follow specific instructions given to you by our office regarding your preparation for the procedure.  We have sent in Protonix to your pharmacy you will take it twice daily now

## 2014-07-23 ENCOUNTER — Telehealth: Payer: Self-pay | Admitting: Internal Medicine

## 2014-07-23 NOTE — Telephone Encounter (Signed)
PLEASE NOTE: All timestamps contained within this report are represented as [place] Standard Time. CONFIDENTIALTY NOTICE: This fax transmission is intended only for the addressee. It contains information that is legally privileged, confidential or otherwise protected from use or disclosure. If you are not the intended recipient, you are strictly prohibited from reviewing, disclosing, copying using or disseminating any of this information or taking any action in reliance on or regarding this information. If you have received this fax in error, please notify us immediately by telephone so that we can arrange for its return to [place]. Phone: [phone], Toll-Free: [phone], Fax: [phone] Page: 1 of 1 Call Id: [id number] [facility] - Client [place] Patient Name: VEORA FONTE DOB: 07/02/1945 Initial Comment Caller states she has a UTI. Nurse Assessment Nurse: Malva Cogan, RN, Juliann Pulse Date/Time (Eastern Time): 07/23/2014 5:10:38 PM Confirm and document reason for call. If symptomatic, describe symptoms. ---Caller states that she "knows" that she has a UTI & was advised by someone from office that her PCP would be advised of her symptoms & someone would call her back but no one ever called her back from office & was advised that her PCP left the office approx 1400 today. Caller adamantly refuses to have her symptoms triaged & advised that no oral antibiotics are called in without being physically examined, but caller argumentative & states that she was advised that someone was going to call her back after her PCP was advised of her symptoms. Caller is at drug store now & wants to know what she can take now for her discomfort, advised in OTC AZO if doesn't interfered with her other meds. Has the patient traveled out of the country within the last 30 days? ---No Does the patient require triage? ---Declined Triage Please  document clinical information provided and list any resource used. ---See previous note. Guidelines Guideline Title Affirmed Question Affirmed Notes Final Disposition User Clinical Call [place], RN, Juliann Pulse

## 2014-07-23 NOTE — Telephone Encounter (Signed)
Pt states she has UTI.  Pt is urinating a lot and it also burns while urinating.   Pt states she had not long ago and this is the same thing. Pt also states she would like rx asap.  [facility] Linna Hoff

## 2014-07-23 NOTE — Telephone Encounter (Signed)
Please advise 

## 2014-07-24 MED ORDER — SULFAMETHOXAZOLE-TRIMETHOPRIM 800-160 MG PO TABS: 1.0000 | ORAL_TABLET | Freq: Two times a day (BID) | ORAL | Status: DC

## 2014-07-24 NOTE — Telephone Encounter (Signed)
Spoke to pt, told her Dr. Raliegh Ip is going to send in Bactrim DS one tablet twice a day x 7 days. Pt verbalized understanding. Rx sent

## 2014-07-24 NOTE — Telephone Encounter (Signed)
Generic Septra DS No. 14 one twice a day 

## 2014-07-24 NOTE — Telephone Encounter (Signed)
Please see message and advise 

## 2014-07-25 NOTE — Progress Notes (Signed)
Reviewed and agree with management. Kanton Kamel D. Olson Lucarelli, M.D., FACG  

## 2014-09-02 ENCOUNTER — Encounter: Payer: Self-pay | Admitting: Internal Medicine

## 2014-09-02 ENCOUNTER — Ambulatory Visit (INDEPENDENT_AMBULATORY_CARE_PROVIDER_SITE_OTHER): Payer: PPO | Admitting: Internal Medicine

## 2014-09-02 VITALS — BP 120/80 | HR 53 | Temp 98.5°F | Resp 18 | Ht 62.0 in | Wt 146.0 lb

## 2014-09-02 DIAGNOSIS — K130 Diseases of lips: Secondary | ICD-10-CM

## 2014-09-02 NOTE — Progress Notes (Signed)
Pre visit review using our clinic review tool, if applicable. No additional management support is needed unless otherwise documented below in the visit note. 

## 2014-09-02 NOTE — Progress Notes (Signed)
Subjective:    Patient ID: Haley [name], female    DOB: 08-01-1945, 69 y.o.   MRN: [id number]  HPI 69 year old patient who is seen today in follow-up.  She was seen about 6 weeks ago with some dermatologic concerns about her arms and also the right upper lip.  She felt that perhaps she had multiple bee stings.  At that time, but there is really no clear history of this.  Her main complaint is persistent swelling and a sense of fullness involving the right upper lip.  A small ulcer was noted 6 weeks ago  Past Medical History  Diagnosis Date  . Thyroid disease   . OA (osteoarthritis)   . MVP (mitral valve prolapse)     mild and MR  . Fibromyalgia   . Hemorrhoids     internal  . Paroxysmal a-fib   . Hyperlipidemia   . GERD (gastroesophageal reflux disease)   . Varicosities of leg   . H/O echocardiogram 04/28/08    EF>55% trace mitral regurgitation, No significant valvular pathology  . History of stress test 02/08/2011    Normal Myocardial perfusion study, this is a low risk scan, No prior study available for comparison  . Osteoporosis     Social History   Social History  . Marital Status: Married    Spouse Name: N/A  . Number of Children: N/A  . Years of Education: N/A   Occupational History  . Not on file.   Social History Main Topics  . Smoking status: Current Some Day Smoker    Types: Cigars  . Smokeless tobacco: Never Used     Comment: peach flavor   . Alcohol Use: No  . Drug Use: No  . Sexual Activity: Not on file   Other Topics Concern  . Not on file   Social History Narrative    Past Surgical History  Procedure Laterality Date  . Abdominal hysterectomy  1989  . Nose surgery  1990  . Colonoscopy  11-20-2000  . Appendectomy  1989    with hysterectomy    Family History  Problem Relation Age of Onset  . Diabetes Daughter   . Diabetes Mother   . Hypertension Mother   . Stroke Maternal Grandmother   . Colon cancer Neg Hx     Allergies  Allergen  Reactions  . Tramadol Hcl     REACTION: jittery    Current Outpatient Prescriptions on File Prior to Visit  Medication Sig Dispense Refill  . alendronate (FOSAMAX) 70 MG tablet Take 1 tablet by mouth every 7 days. 12 tablet 1  . ALPRAZolam (XANAX) 0.5 MG tablet TAKE ONE TABLET BY MOUTH ONCE DAILY AS NEEDED 90 tablet 1  . aspirin 81 MG tablet Take 81 mg by mouth daily.      . Calcium Carbonate (CALTRATE 600) 1500 MG TABS Take by mouth.      . cholecalciferol (VITAMIN D) 1000 UNITS tablet Take 1,000 Units by mouth daily.     . clobetasol cream (TEMOVATE) 0.05 % Apply twice a week first week then once weekly as needed 30 g 2  . fluticasone (CUTIVATE) 0.05 % cream Apply 1 application topically 2 (two) times daily.     . fluticasone (FLONASE) 50 MCG/ACT nasal spray Place 2 sprays into both nostrils daily. 48 g 3  . levothyroxine (SYNTHROID, LEVOTHROID) 75 MCG tablet Take 1 tablet (75 mcg total) by mouth daily. 90 tablet 1  . metoprolol succinate (TOPROL-XL) 25 MG 24 hr  tablet Take 1 tablet (25 mg total) by mouth daily. 90 tablet 3  . Multiple Vitamins-Minerals (MULTIVITAMIN WITH MINERALS) tablet Take 1 tablet by mouth daily.      . Omega-3 Fatty Acids (FISH OIL) 1000 MG CAPS Take 2 capsules by mouth 2 (two) times daily.    . pantoprazole (PROTONIX) 40 MG tablet Take 1 tablet (40 mg total) by mouth 2 (two) times daily before a meal. 60 tablet 3  . simvastatin (ZOCOR) 20 MG tablet Take 1 tablet (20 mg total) by mouth at bedtime. 90 tablet 3  . tretinoin (RETIN-A) 0.025 % cream Apply topically at bedtime.     . vitamin C (ASCORBIC ACID) 500 MG tablet Take 500 mg by mouth daily.       No current facility-administered medications on file prior to visit.    BP 120/80 mmHg  Pulse 53  Temp(Src) 98.5 F (36.9 C) (Oral)  Resp 18  Ht 5\' 2"  (1.575 m)  Wt 146 lb (66.225 kg)  BMI 26.70 kg/m2  SpO2 98%      Review of Systems  Constitutional: Negative.   HENT: Negative for congestion, dental  problem, hearing loss, rhinorrhea, sinus pressure, sore throat and tinnitus.   Eyes: Negative for pain, discharge and visual disturbance.  Respiratory: Negative for cough and shortness of breath.   Cardiovascular: Negative for chest pain, palpitations and leg swelling.  Gastrointestinal: Negative for nausea, vomiting, abdominal pain, diarrhea, constipation, blood in stool and abdominal distention.  Genitourinary: Negative for dysuria, urgency, frequency, hematuria, flank pain, vaginal bleeding, vaginal discharge, difficulty urinating, vaginal pain and pelvic pain.  Musculoskeletal: Negative for joint swelling, arthralgias and gait problem.  Skin: Negative for rash.  Neurological: Negative for dizziness, syncope, speech difficulty, weakness, numbness and headaches.  Hematological: Negative for adenopathy.  Psychiatric/Behavioral: Negative for behavioral problems, dysphoric mood and agitation. The patient is not nervous/anxious.        Objective:   Physical Exam  Constitutional: She appears well-developed and well-nourished. No distress.  HENT:  The tiny 1 or 2 mm crusted lesion present at the corner of the mouth on the right.  The lip appeared normal, although slightly dry, did not appear to be erythematous or particularly swollen  Skin:  Chronic solar skin changes noted on the arms only          Assessment & Plan:   Mild cheilitis.  We'll try Vaseline topically Anxiety disorder Hypothyroidism

## 2014-09-02 NOTE — Patient Instructions (Signed)
HOME CARE INSTRUCTIONS  Maintain good oral hygiene. This is especially important for transplant patients.  Brush your teeth carefully with a soft, nylon-bristled toothbrush.  Floss at least 2 times a day.  Clean your mouth after eating.  Rinse your mouth with salt water 3 to 4 times a day.  Gargle with cold water.  Use topical numbing medicines to decrease pain if recommended by your caregiver.  Stop smoking, and stop using chewing or smokeless tobacco.  Avoid eating hot and spicy foods.  Eat soft and bland food.  Reduce your stress wherever possible.  Eat healthy and nutritious foods.

## 2014-09-19 ENCOUNTER — Ambulatory Visit (AMBULATORY_SURGERY_CENTER): Payer: PPO | Admitting: Gastroenterology

## 2014-09-19 ENCOUNTER — Encounter: Payer: Self-pay | Admitting: Gastroenterology

## 2014-09-19 VITALS — BP 140/85 | HR 56 | Temp 99.0°F | Resp 22 | Ht 62.0 in | Wt 148.0 lb

## 2014-09-19 DIAGNOSIS — K2950 Unspecified chronic gastritis without bleeding: Secondary | ICD-10-CM | POA: Diagnosis not present

## 2014-09-19 DIAGNOSIS — K219 Gastro-esophageal reflux disease without esophagitis: Secondary | ICD-10-CM | POA: Diagnosis not present

## 2014-09-19 MED ORDER — SODIUM CHLORIDE 0.9 % IV SOLN: 500.0000 mL | INTRAVENOUS | Status: DC

## 2014-09-19 NOTE — Op Note (Addendum)
[facility]  [address] [place], [id number]   ENDOSCOPY PROCEDURE REPORT  PATIENT: Haley, [name]  MR#: [id number] BIRTHDATE: 03/01/1945 , 21  yrs. old GENDER: female ENDOSCOPIST: Inda Castle, MD REFERRED BY:  Bluford Kaufmann, M.D. PROCEDURE DATE:  09/19/2014 PROCEDURE:  EGD w/ biopsy ASA CLASS:     Class II INDICATIONS:  heartburn and halitosis. MEDICATIONS: Propofol 130 mg IV TOPICAL ANESTHETIC:  DESCRIPTION OF PROCEDURE: After the risks benefits and alternatives of the procedure were thoroughly explained, informed consent was obtained.  The LB [id number] endoscope was introduced through the mouth and advanced to the second portion of the duodenum , Without limitations.  The instrument was slowly withdrawn as the mucosa was fully examined.      EXAM: The esophagus and gastroesophageal junction were completely normal in appearance.  The stomach was entered and closely examined.The antrum, angularis, and lesser curvature were well visualized, including a retroflexed view of the cardia and fundus. The stomach wall was normally distensable.  The scope passed easily through the pylorus into the duodenum.  Retroflexed views revealed no abnormalities.   biopsies were taken to rule out H pylori  The scope was then withdrawn from the patient and the procedure completed.  COMPLICATIONS: There were no immediate complications.  ENDOSCOPIC IMPRESSION: Normal appearing esophagus and GE junction, the stomach was well visualized and normal in appearance, normal appearing duodenum  RECOMMENDATIONS: 1.  Await biopsy results 2.  My office will arrange for you to have a Gastric Emptying Scan performed.  This is a radiology test that gives an idea of how well your stomach functions.  REPEAT EXAM:  eSigned:  Inda Castle, MD 09/19/2014 2:11 PM Revised: 09/19/2014 2:11 PM   CC:  PATIENT NAME:  Haley, [name] MR#: [id number]

## 2014-09-19 NOTE — Patient Instructions (Signed)
YOU HAD AN ENDOSCOPIC PROCEDURE TODAY AT THE [facility]:   Refer to the procedure report that was given to you for any specific questions about what was found during the examination.  If the procedure report does not answer your questions, please call your gastroenterologist to clarify.  If you requested that your care partner not be given the details of your procedure findings, then the procedure report has been included in a sealed envelope for you to review at your convenience later.  YOU SHOULD EXPECT: Some feelings of bloating in the abdomen. Passage of more gas than usual.  Walking can help get rid of the air that was put into your GI tract during the procedure and reduce the bloating. If you had a lower endoscopy (such as a colonoscopy or flexible sigmoidoscopy) you may notice spotting of blood in your stool or on the toilet paper. If you underwent a bowel prep for your procedure, you may not have a normal bowel movement for a few days.  Please Note:  You might notice some irritation and congestion in your nose or some drainage.  This is from the oxygen used during your procedure.  There is no need for concern and it should clear up in a day or so.  SYMPTOMS TO REPORT IMMEDIATELY:    Following upper endoscopy (EGD)  Vomiting of blood or coffee ground material  New chest pain or pain under the shoulder blades  Painful or persistently difficult swallowing  New shortness of breath  Fever of 100F or higher  Black, tarry-looking stools  For urgent or emergent issues, a gastroenterologist can be reached at any hour by calling [phone].   DIET: Your first meal following the procedure should be a small meal and then it is ok to progress to your normal diet. Heavy or fried foods are harder to digest and may make you feel nauseous or bloated.  Likewise, meals heavy in dairy and vegetables can increase bloating.  Drink plenty of fluids but you should avoid alcoholic beverages  for 24 hours.  ACTIVITY:  You should plan to take it easy for the rest of today and you should NOT DRIVE or use heavy machinery until tomorrow (because of the sedation medicines used during the test).    FOLLOW UP: Our staff will call the number listed on your records the next business day following your procedure to check on you and address any questions or concerns that you may have regarding the information given to you following your procedure. If we do not reach you, we will leave a message.  However, if you are feeling well and you are not experiencing any problems, there is no need to return our call.  We will assume that you have returned to your regular daily activities without incident.  If any biopsies were taken you will be contacted by phone or by letter within the next 1-3 weeks.  Please call us at [phone] if you have not heard about the biopsies in 3 weeks.    SIGNATURES/CONFIDENTIALITY: You and/or your care partner have signed paperwork which will be entered into your electronic medical record.  These signatures attest to the fact that that the information above on your After Visit Summary has been reviewed and is understood.  Full responsibility of the confidentiality of this discharge information lies with you and/or your care-partner. 

## 2014-09-19 NOTE — Progress Notes (Signed)
To recovery, report to [place], RN, VSS. 

## 2014-09-22 ENCOUNTER — Telehealth: Payer: Self-pay

## 2014-09-22 ENCOUNTER — Other Ambulatory Visit: Payer: Self-pay

## 2014-09-22 ENCOUNTER — Telehealth: Payer: Self-pay | Admitting: *Deleted

## 2014-09-22 DIAGNOSIS — R12 Heartburn: Secondary | ICD-10-CM

## 2014-09-22 NOTE — Telephone Encounter (Signed)
No answer. No identifier. Message left to call if questions or concerns. 

## 2014-09-22 NOTE — Telephone Encounter (Signed)
Patient had her EGD 09/19/14 which was normal appearing. Left message to call back. Need to schedule GES.

## 2014-09-26 ENCOUNTER — Encounter: Payer: Self-pay | Admitting: Gastroenterology

## 2014-09-26 NOTE — Telephone Encounter (Signed)
Left message for the patient to call back.

## 2014-09-29 NOTE — Telephone Encounter (Signed)
Patient contacted scheduled and instructed.

## 2014-10-07 ENCOUNTER — Telehealth: Payer: Self-pay | Admitting: Gastroenterology

## 2014-10-08 NOTE — Telephone Encounter (Signed)
Explained chronic gastritis to the patient.

## 2014-10-24 ENCOUNTER — Ambulatory Visit (INDEPENDENT_AMBULATORY_CARE_PROVIDER_SITE_OTHER): Payer: PPO | Admitting: Internal Medicine

## 2014-10-24 ENCOUNTER — Encounter: Payer: Self-pay | Admitting: Internal Medicine

## 2014-10-24 VITALS — BP 118/72 | HR 51 | Temp 97.9°F | Ht 61.0 in | Wt 147.0 lb

## 2014-10-24 DIAGNOSIS — E039 Hypothyroidism, unspecified: Secondary | ICD-10-CM

## 2014-10-24 DIAGNOSIS — E785 Hyperlipidemia, unspecified: Secondary | ICD-10-CM

## 2014-10-24 DIAGNOSIS — I480 Paroxysmal atrial fibrillation: Secondary | ICD-10-CM

## 2014-10-24 DIAGNOSIS — Z0000 Encounter for general adult medical examination without abnormal findings: Secondary | ICD-10-CM

## 2014-10-24 DIAGNOSIS — F17200 Nicotine dependence, unspecified, uncomplicated: Secondary | ICD-10-CM

## 2014-10-24 LAB — CBC WITH DIFFERENTIAL/PLATELET
BASOS ABS: 0.1 10*3/uL (ref 0.0–0.1)
BASOS PCT: 0.6 % (ref 0.0–3.0)
Eosinophils Absolute: 0.8 10*3/uL — ABNORMAL HIGH (ref 0.0–0.7)
Eosinophils Relative: 8.2 % — ABNORMAL HIGH (ref 0.0–5.0)
HEMATOCRIT: 41 % (ref 36.0–46.0)
Hemoglobin: 13.7 g/dL (ref 12.0–15.0)
LYMPHS ABS: 2.7 10*3/uL (ref 0.7–4.0)
Lymphocytes Relative: 27.1 % (ref 12.0–46.0)
MCHC: 33.3 g/dL (ref 30.0–36.0)
MCV: 89.9 fl (ref 78.0–100.0)
MONOS PCT: 6.3 % (ref 3.0–12.0)
Monocytes Absolute: 0.6 10*3/uL (ref 0.1–1.0)
NEUTROS ABS: 5.7 10*3/uL (ref 1.4–7.7)
NEUTROS PCT: 57.8 % (ref 43.0–77.0)
PLATELETS: 227 10*3/uL (ref 150.0–400.0)
RBC: 4.57 Mil/uL (ref 3.87–5.11)
RDW: 14.8 % (ref 11.5–15.5)
WBC: 9.8 10*3/uL (ref 4.0–10.5)

## 2014-10-24 LAB — POCT URINALYSIS DIPSTICK
BILIRUBIN UA: NEGATIVE
GLUCOSE UA: NEGATIVE
KETONES UA: NEGATIVE
Leukocytes, UA: NEGATIVE
Nitrite, UA: NEGATIVE
PH UA: 6.5
Protein, UA: NEGATIVE
Spec Grav, UA: 1.01
Urobilinogen, UA: 0.2

## 2014-10-24 LAB — TSH: TSH: 1.27 u[IU]/mL (ref 0.35–4.50)

## 2014-10-24 NOTE — Progress Notes (Signed)
Pre visit review using our clinic review tool, if applicable. No additional management support is needed unless otherwise documented below in the visit note. 

## 2014-10-24 NOTE — Progress Notes (Signed)
Subjective:    Patient ID: Haley [name], female    DOB: Sep 14, 1945, 69 y.o.   MRN: [id number]  HPI   Subjective:    Patient ID: Haley [name], female    DOB: Aug 29, 1945, 69 y.o.   MRN: [id number]  HPI 69  -year-old patient who is seen today for a preventive health examination.  She is followed by cardiology and OB/GYN. In 2013 she underwent a nuclear stress test that was normal. She's had a prior partial hysterectomy due  to fibroids.  She has seen both orthopedics and podiatry in the past.  Family history father died at 75 details unclear Mother died at 75 complications of coronary artery disease. History of late onset diabetes 4 brothers 2 sisters. One brother died at age 83 of an MI  She has had upper endoscopy in September 2016.  The revealed mild gastritis only.  Colonoscopy was performed in October 2014.  Her last Pap was October 2015.  Past Medical History  Diagnosis Date  . Thyroid disease   . OA (osteoarthritis)   . MVP (mitral valve prolapse)     mild and MR  . Fibromyalgia   . Hemorrhoids     internal  . Paroxysmal a-fib ([place])   . Hyperlipidemia   . GERD (gastroesophageal reflux disease)   . Varicosities of leg   . H/O echocardiogram 04/28/08    EF>55% trace mitral regurgitation, No significant valvular pathology  . History of stress test 02/08/2011    Normal Myocardial perfusion study, this is a low risk scan, No prior study available for comparison  . Osteoporosis   . Allergy     SEASONAL  . Cataract     BILATERAL    Social History   Social History  . Marital Status: Married    Spouse Name: N/A  . Number of Children: N/A  . Years of Education: N/A   Occupational History  . Not on file.   Social History Main Topics  . Smoking status: Current Some Day Smoker    Types: Cigars  . Smokeless tobacco: Never Used     Comment: peach flavor   . Alcohol Use: No  . Drug Use: No  . Sexual Activity: Not on file   Other Topics Concern  . Not on file    Social History Narrative    Past Surgical History  Procedure Laterality Date  . Abdominal hysterectomy  1989  . Nose surgery  1990  . Colonoscopy  11-20-2000  . Appendectomy  1989    with hysterectomy    Family History  Problem Relation Age of Onset  . Diabetes Daughter   . Diabetes Mother   . Hypertension Mother   . Heart disease Mother   . Stroke Maternal Grandmother   . Colon cancer Neg Hx     Allergies  Allergen Reactions  . Tramadol Hcl     REACTION: jittery    Current Outpatient Prescriptions on File Prior to Visit  Medication Sig Dispense Refill  . alendronate (FOSAMAX) 70 MG tablet Take 1 tablet by mouth every 7 days. 12 tablet 1  . ALPRAZolam (XANAX) 0.5 MG tablet TAKE ONE TABLET BY MOUTH ONCE DAILY AS NEEDED 90 tablet 1  . aspirin 81 MG tablet Take 81 mg by mouth daily.      . Calcium Carbonate (CALTRATE 600) 1500 MG TABS Take by mouth.      . cholecalciferol (VITAMIN D) 1000 UNITS tablet Take 1,000 Units by mouth daily.     Marland Kitchen  clobetasol cream (TEMOVATE) 0.05 % Apply twice a week first week then once weekly as needed 30 g 2  . fluticasone (CUTIVATE) 0.05 % cream Apply 1 application topically 2 (two) times daily.     . fluticasone (FLONASE) 50 MCG/ACT nasal spray Place 2 sprays into both nostrils daily. 48 g 3  . levothyroxine (SYNTHROID, LEVOTHROID) 75 MCG tablet Take 1 tablet (75 mcg total) by mouth daily. 90 tablet 1  . metoprolol succinate (TOPROL-XL) 25 MG 24 hr tablet Take 1 tablet (25 mg total) by mouth daily. 90 tablet 3  . Multiple Vitamins-Minerals (MULTIVITAMIN WITH MINERALS) tablet Take 1 tablet by mouth daily.      . Omega-3 Fatty Acids (FISH OIL) 1000 MG CAPS Take 2 capsules by mouth 2 (two) times daily.    . pantoprazole (PROTONIX) 40 MG tablet Take 1 tablet (40 mg total) by mouth 2 (two) times daily before a meal. 60 tablet 3  . simvastatin (ZOCOR) 20 MG tablet Take 1 tablet (20 mg total) by mouth at bedtime. 90 tablet 3  . tretinoin (RETIN-A)  0.025 % cream Apply topically at bedtime.     . vitamin C (ASCORBIC ACID) 500 MG tablet Take 500 mg by mouth daily.       No current facility-administered medications on file prior to visit.    BP 118/72 mmHg  Pulse 51  Temp(Src) 97.9 F (36.6 C) (Oral)  Ht 5\' 1"  (1.549 m)  Wt 147 lb (66.679 kg)  BMI 27.79 kg/m2   1. Risk factors, based on past  M,S,F history-  cardiovascular risk factors include dyslipidemia and ongoing tobacco use  2.  Physical activities: No major activity restrictions although presently complaining of right foot and ankle discomfort  3.  Depression/mood: History of anxiety disorder but no major depression medical regimen includes alprazolam  4.  Hearing: Complains a mild hearing deficit  5.  ADL's: Independent in all aspects of daily living  6.  Fall risk: Low  7.  Home safety: No problems identified  8.  Height weight, and visual acuity; height and weight stable no change in visual acuity. Wears contacts. Does have annual eye examination  9.  Counseling: Follow cardiology and GYN  10. Lab orders based on risk factors: Patient may have a history of impaired glucose tolerance. We'll check a hemoglobin A1c. We'll check a lipid profile  11. Referral : Followup GYN and cardiology. Followup orthopedics  12. Care plan: Continue present regimen. Total smoking cessation encouraged  13. Cognitive assessment: Alert in order with normal affect. No cognitive dysfunction  14.  Preventive services- mammogram, encouraged  15.  Provider.  Update- consultants include OB/GYN.  Cardiology, podiatry and orthopedics       Review of Systems  Constitutional: Negative for fever, appetite change, fatigue and unexpected weight change.  HENT: Negative for congestion, dental problem, ear pain, hearing loss, mouth sores, nosebleeds, sinus pressure, sore throat, tinnitus, trouble swallowing and voice change.   Eyes: Negative for photophobia, pain, redness and visual  disturbance.  Respiratory: Negative for cough, chest tightness and shortness of breath.   Cardiovascular: Negative for chest pain, palpitations and leg swelling.  Gastrointestinal: Negative for nausea, vomiting, abdominal pain, diarrhea, constipation, blood in stool, abdominal distention and rectal pain.  Genitourinary: Negative for dysuria, urgency, frequency, hematuria, flank pain, vaginal bleeding, vaginal discharge, difficulty urinating, genital sores, vaginal pain, menstrual problem and pelvic pain.  Musculoskeletal: Positive for gait problem. Negative for arthralgias, back pain and neck stiffness.  Right ankle pain of 2 years duration  Skin: Negative for rash.  Neurological: Negative for dizziness, syncope, speech difficulty, weakness, light-headedness, numbness and headaches.  Hematological: Negative for adenopathy. Does not bruise/bleed easily.  Psychiatric/Behavioral: Negative for suicidal ideas, behavioral problems, self-injury, dysphoric mood and agitation. The patient is not nervous/anxious.        Objective:   Physical Exam  Constitutional: She is oriented to person, place, and time. She appears well-developed and well-nourished.  Blood pressure 140/70  HENT:  Head: Normocephalic and atraumatic.  Right Ear: External ear normal.  Left Ear: External ear normal.  Mouth/Throat: Oropharynx is clear and moist.  Eyes: Conjunctivae and EOM are normal.  Neck: Normal range of motion. Neck supple. No JVD present. No thyromegaly present.  Cardiovascular: Normal rate, regular rhythm, normal heart sounds and intact distal pulses.   No murmur heard. Decreased left dorsalis pedis pulse  Pulmonary/Chest: Effort normal and breath sounds normal. She has no wheezes. She has no rales.  Breast examination not performed  Abdominal: Soft. Bowel sounds are normal. She exhibits no distension and no mass. There is no tenderness. There is no rebound and no guarding.  Musculoskeletal: Normal  range of motion. She exhibits no edema and no tenderness.  Neurological: She is alert and oriented to person, place, and time. She has normal reflexes. No cranial nerve deficit. She exhibits normal muscle tone. Coordination normal.  Skin: Skin is warm and dry. No rash noted.  Psychiatric: She has a normal mood and affect. Her behavior is normal.          Assessment & Plan:   Preventive health examination  Tobacco abuse. Total smoking cessation encouraged Dyslipidemia will check a lipid profile Hypothyroidism.  We'll check a TSH Gastroesophageal reflux disease.  Continue PPI therapy  Anxiety disorder  Recheck 6 months  Review of Systems  Constitutional: Negative for fever, appetite change, fatigue and unexpected weight change.  HENT: Negative for congestion, dental problem, ear pain, hearing loss, mouth sores, nosebleeds, sinus pressure, sore throat, tinnitus, trouble swallowing and voice change.   Eyes: Negative for photophobia, pain, redness and visual disturbance.  Respiratory: Negative for cough, chest tightness and shortness of breath.   Cardiovascular: Negative for chest pain, palpitations and leg swelling.  Gastrointestinal: Negative for nausea, vomiting, abdominal pain, diarrhea, constipation, blood in stool, abdominal distention and rectal pain.  Genitourinary: Negative for dysuria, urgency, frequency, hematuria, flank pain, vaginal bleeding, vaginal discharge, difficulty urinating, genital sores, vaginal pain, menstrual problem and pelvic pain.  Musculoskeletal: Negative for back pain, arthralgias and neck stiffness.  Skin: Positive for rash.  Neurological: Negative for dizziness, syncope, speech difficulty, weakness, light-headedness, numbness and headaches.  Hematological: Negative for adenopathy. Does not bruise/bleed easily.  Psychiatric/Behavioral: Negative for suicidal ideas, behavioral problems, self-injury, dysphoric mood and agitation. The patient is  nervous/anxious.        Objective:   Physical Exam  Constitutional: She is oriented to person, place, and time. She appears well-developed and well-nourished.  HENT:  Head: Normocephalic and atraumatic.  Right Ear: External ear normal.  Left Ear: External ear normal.  Mouth/Throat: Oropharynx is clear and moist.  Eyes: Conjunctivae and EOM are normal.  Neck: Normal range of motion. Neck supple. No JVD present. No thyromegaly present.  Cardiovascular: Normal rate, regular rhythm, normal heart sounds and intact distal pulses.   No murmur heard. Pulmonary/Chest: Effort normal and breath sounds normal. She has no wheezes. She has no rales.  Abdominal: Soft. Bowel sounds are normal. She exhibits  no distension and no mass. There is no tenderness. There is no rebound and no guarding.  Genitourinary: Vagina normal.  Musculoskeletal: Normal range of motion. She exhibits no edema or tenderness.  Neurological: She is alert and oriented to person, place, and time. She has normal reflexes. No cranial nerve deficit. She exhibits normal muscle tone. Coordination normal.  Skin: Skin is warm and dry. No rash noted.  A few scattered Annular patchy, slightly raised erythematous lesions over the arms  Psychiatric: She has a normal mood and affect. Her behavior is normal.          Assessment & Plan:  Preventive health exam Hypothyroidism.  We'll check a TSH History of paroxysmal atrial fibrillation, stable Mild dyslipidemia.  Continue statin therapy Anxiety disorder Tobacco abuse.  Total smoking cessation encouraged  Recheck 6 months

## 2014-10-24 NOTE — Patient Instructions (Signed)
Limit your sodium (Salt) intake    It is important that you exercise regularly, at least 20 minutes 3 to 4 times per week.  If you develop chest pain or shortness of breath seek  medical attention.  Please check your blood pressure on a regular basis.  If it is consistently greater than 150/90, please make an office appointment.  Return in 6 months for follow-up  Heart-Healthy Eating Plan Many factors influence your heart health, including eating and exercise habits. Heart (coronary) risk increases with abnormal blood fat (lipid) levels. Heart-healthy meal planning includes limiting unhealthy fats, increasing healthy fats, and making other small dietary changes. This includes maintaining a healthy body weight to help keep lipid levels within a normal range. WHAT IS MY PLAN?  Your health care provider recommends that you:  Get no more than _________% of the total calories in your daily diet from fat.  Limit your intake of saturated fat to less than _________% of your total calories each day.  Limit the amount of cholesterol in your diet to less than _________ mg per day. WHAT TYPES OF FAT SHOULD I CHOOSE?  Choose healthy fats more often. Choose monounsaturated and polyunsaturated fats, such as olive oil and canola oil, flaxseeds, walnuts, almonds, and seeds.  Eat more omega-3 fats. Good choices include salmon, mackerel, sardines, tuna, flaxseed oil, and ground flaxseeds. Aim to eat fish at least two times each week.  Limit saturated fats. Saturated fats are primarily found in animal products, such as meats, butter, and cream. Plant sources of saturated fats include palm oil, palm kernel oil, and coconut oil.  Avoid foods with partially hydrogenated oils in them. These contain trans fats. Examples of foods that contain trans fats are stick margarine, some tub margarines, cookies, crackers, and other baked goods. WHAT GENERAL GUIDELINES DO I NEED TO FOLLOW?  Check food labels carefully to  identify foods with trans fats or high amounts of saturated fat.  Fill one half of your plate with vegetables and green salads. Eat 4-5 servings of vegetables per day. A serving of vegetables equals 1 cup of raw leafy vegetables,  cup of raw or cooked cut-up vegetables, or  cup of vegetable juice.  Fill one fourth of your plate with whole grains. Look for the word "whole" as the first word in the ingredient list.  Fill one fourth of your plate with lean protein foods.  Eat 4-5 servings of fruit per day. A serving of fruit equals one medium whole fruit,  cup of dried fruit,  cup of fresh, frozen, or canned fruit, or  cup of 100% fruit juice.  Eat more foods that contain soluble fiber. Examples of foods that contain this type of fiber are apples, broccoli, carrots, beans, peas, and barley. Aim to get 20-30 g of fiber per day.  Eat more home-cooked food and less restaurant, buffet, and fast food.  Limit or avoid alcohol.  Limit foods that are high in starch and sugar.  Avoid fried foods.  Cook foods by using methods other than frying. Baking, boiling, grilling, and broiling are all great options. Other fat-reducing suggestions include:  Removing the skin from poultry.  Removing all visible fats from meats.  Skimming the fat off of stews, soups, and gravies before serving them.  Steaming vegetables in water or broth.  Lose weight if you are overweight. Losing just 5-10% of your initial body weight can help your overall health and prevent diseases such as diabetes and heart disease.  Increase  your consumption of nuts, legumes, and seeds to 4-5 servings per week. One serving of dried beans or legumes equals  cup after being cooked, one serving of nuts equals 1 ounces, and one serving of seeds equals  ounce or 1 tablespoon.  You may need to monitor your salt (sodium) intake, especially if you have high blood pressure. Talk with your health care provider or dietitian to get more  information about reducing sodium. WHAT FOODS CAN I EAT? Grains Breads, including [place], white, pita, wheat, raisin, rye, oatmeal, and [place]. Tortillas that are neither fried nor made with lard or trans fat. Low-fat rolls, including hotdog and hamburger buns and English muffins. Biscuits. Muffins. Waffles. Pancakes. Light popcorn. Whole-grain cereals. Flatbread. Melba toast. Pretzels. Breadsticks. Rusks. Low-fat snacks and crackers, including oyster, saltine, matzo, graham, animal, and rye. Rice and pasta, including brown rice and those that are made with whole wheat. Vegetables All vegetables. Fruits All fruits, but limit coconut. Meats and Other Protein Sources Lean, well-trimmed beef, veal, pork, and lamb. Chicken and [place] without skin. All fish and shellfish. Wild duck, rabbit, pheasant, and venison. Egg whites or low-cholesterol egg substitutes. Dried beans, peas, lentils, and tofu.Seeds and most nuts. Dairy Low-fat or nonfat cheeses, including ricotta, string, and mozzarella. Skim or 1% milk that is liquid, powdered, or evaporated. Buttermilk that is made with low-fat milk. Nonfat or low-fat yogurt. Beverages Mineral water. Diet carbonated beverages. Sweets and Desserts Sherbets and fruit ices. Honey, jam, marmalade, jelly, and syrups. Meringues and gelatins. Pure sugar candy, such as hard candy, jelly beans, gumdrops, mints, marshmallows, and small amounts of dark chocolate. [employer] Eat all sweets and desserts in moderation. Fats and Oils Nonhydrogenated (trans-free) margarines. Vegetable oils, including soybean, sesame, sunflower, olive, peanut, safflower, corn, canola, and cottonseed. Salad dressings or mayonnaise that are made with a vegetable oil. Limit added fats and oils that you use for cooking, baking, salads, and as spreads. Other Cocoa powder. Coffee and tea. All seasonings and condiments. The items listed above may not be a complete list of recommended foods or  beverages. Contact your dietitian for more options. WHAT FOODS ARE NOT RECOMMENDED? Grains Breads that are made with saturated or trans fats, oils, or whole milk. Croissants. Butter rolls. Cheese breads. Sweet rolls. Donuts. Buttered popcorn. Chow mein noodles. High-fat crackers, such as cheese or butter crackers. Meats and Other Protein Sources Fatty meats, such as hotdogs, short ribs, sausage, spareribs, bacon, ribeye roast or steak, and mutton. High-fat deli meats, such as salami and bologna. Caviar. Domestic duck and goose. Organ meats, such as kidney, liver, sweetbreads, brains, gizzard, chitterlings, and heart. Dairy Cream, sour cream, cream cheese, and creamed cottage cheese. Whole milk cheeses, including blue (bleu), Monte[place] Jack, [place], [place], American, [place], Swiss, [place], [place], and [place]. Whole or 2% milk that is liquid, evaporated, or condensed. Whole buttermilk. Cream sauce or high-fat cheese sauce. Yogurt that is made from whole milk. Beverages Regular sodas and drinks with added sugar. Sweets and Desserts Frosting. Pudding. Cookies. Cakes other than angel food cake. Candy that has milk chocolate or white chocolate, hydrogenated fat, butter, coconut, or unknown ingredients. Buttered syrups. Full-fat ice cream or ice cream drinks. Fats and Oils Gravy that has suet, meat fat, or shortening. Cocoa butter, hydrogenated oils, palm oil, coconut oil, palm kernel oil. These can often be found in baked products, candy, fried foods, nondairy creamers, and whipped toppings. Solid fats and shortenings, including bacon fat, salt pork, lard, and butter. Nondairy cream substitutes, such  as coffee creamers and sour cream substitutes. Salad dressings that are made of unknown oils, cheese, or sour cream. The items listed above may not be a complete list of foods and beverages to avoid. Contact your dietitian for more information.   This information is not intended to replace advice given to  you by your health care provider. Make sure you discuss any questions you have with your health care provider.   Document Released: 10/06/2007 Document Revised: 01/17/2014 Document Reviewed: 06/20/2013 Elsevier Interactive Patient Education [employer].

## 2014-10-29 ENCOUNTER — Other Ambulatory Visit: Payer: Self-pay

## 2014-10-29 ENCOUNTER — Telehealth: Payer: Self-pay

## 2014-10-29 DIAGNOSIS — Z1231 Encounter for screening mammogram for malignant neoplasm of breast: Secondary | ICD-10-CM

## 2014-10-29 DIAGNOSIS — R112 Nausea with vomiting, unspecified: Secondary | ICD-10-CM

## 2014-10-29 NOTE — Telephone Encounter (Signed)
GES rescheduled from [facility] to [facility]

## 2014-10-29 NOTE — Telephone Encounter (Signed)
-----   Message from Manus Gunning, MD sent at 10/29/2014 12:55 PM EDT ----- Yes if her symptoms persist we can order it and put it under my name. Thanks   ----- Message -----    From: Marlon Pel, RN    Sent: 10/29/2014   9:49 AM      To: Manus Gunning, MD  Dr. Havery Moros,   You are [facility] of the day.  This patient was scheduled for a GES by Dr. Deatra Ina back in September.  We need to change the ordering to an another MD.  She is scheduled for 11/04/14.  Would you please review and advise if appropriate to continue with GES.  If yes I will change the ordering provider to you.     Thanks,  [employer]

## 2014-11-04 ENCOUNTER — Ambulatory Visit (HOSPITAL_COMMUNITY): Payer: PPO

## 2014-11-13 ENCOUNTER — Ambulatory Visit (HOSPITAL_COMMUNITY)
Admission: RE | Admit: 2014-11-13 | Discharge: 2014-11-13 | Disposition: A | Discharge status: Home / Self Care | Payer: PPO | Source: Ambulatory Visit | Attending: Gastroenterology | Admitting: Gastroenterology

## 2014-11-13 DIAGNOSIS — R1013 Epigastric pain: Secondary | ICD-10-CM | POA: Diagnosis not present

## 2014-11-13 DIAGNOSIS — K219 Gastro-esophageal reflux disease without esophagitis: Secondary | ICD-10-CM | POA: Insufficient documentation

## 2014-11-13 DIAGNOSIS — R112 Nausea with vomiting, unspecified: Secondary | ICD-10-CM | POA: Diagnosis not present

## 2014-11-17 ENCOUNTER — Ambulatory Visit (INDEPENDENT_AMBULATORY_CARE_PROVIDER_SITE_OTHER): Payer: PPO | Admitting: Cardiovascular Disease

## 2014-11-17 ENCOUNTER — Encounter: Payer: Self-pay | Admitting: Cardiovascular Disease

## 2014-11-17 VITALS — BP 142/80 | HR 52 | Ht 62.0 in | Wt 149.0 lb

## 2014-11-17 DIAGNOSIS — E039 Hypothyroidism, unspecified: Secondary | ICD-10-CM | POA: Diagnosis not present

## 2014-11-17 DIAGNOSIS — E785 Hyperlipidemia, unspecified: Secondary | ICD-10-CM | POA: Diagnosis not present

## 2014-11-17 DIAGNOSIS — I480 Paroxysmal atrial fibrillation: Secondary | ICD-10-CM

## 2014-11-17 NOTE — Progress Notes (Signed)
Patient ID: KIALA FARAJ, female   DOB: 1945/11/16, 69 y.o.   MRN: [id number]     HPI: SHELBIE FRANKEN is a 69 y.o. female presents to the office today for a 7 month followup evaluation.  Ms. Belli  has a history of palpitations, paroxysmal atrial fibrillation, hyperlipidemia, GERD, and hypothyroidism. She has been found to have an atherogenic dyslipidemia lipid panel. She also has a history of lower extremity varicosities and wears support stockings. Remotely she was having difficulty remembering to take her metoprolol on a twice a day regimen at that time I switched her to metoprolol succinate Toprol-XL 25 mg daily. She has felt markedly improved with reference to taking this once a day preparation. She is unaware of any breakthrough tachycardia palpitations. She denies presyncope or syncope. An NMR lipoprofile  showed marked improvement with a cholesterol of 174, triglycerides 119, HDL C. cholesterol 60, and LDL cholesterol at 90. Her LDL particle number was 1020 and her HDL particle number was 39.5. Her insulin resistance score was 39 which was normal.   Laboratory last year showed a total cholesterol 165, triglycerides 100, HDL 54, LDL 91.  TSH was 2.03.  She was not anemic.  She normal chemistry profile.  Since I last saw her, she has felt well.  She is unaware of palpitations.  She denies chest pressure.  She denies GERD.  She will be seeing Dr. Toney Rakes for GYN evaluation.  She will also be undergoing mammography in December.  She has been taking Toprol 25 mg daily.  She is tolerated simvastatin 20 mg for hyperlipidemia.  She is on 75 g of levothyroxine for hypothyroidism.  She also has osteopenia for which she takes Fosamax.  She presents for evaluation.  Past Medical History  Diagnosis Date  . Thyroid disease   . OA (osteoarthritis)   . MVP (mitral valve prolapse)     mild and MR  . Fibromyalgia   . Hemorrhoids     internal  . Paroxysmal a-fib ([place])   . Hyperlipidemia   . GERD  (gastroesophageal reflux disease)   . Varicosities of leg   . H/O echocardiogram 04/28/08    EF>55% trace mitral regurgitation, No significant valvular pathology  . History of stress test 02/08/2011    Normal Myocardial perfusion study, this is a low risk scan, No prior study available for comparison  . Osteoporosis   . Allergy     SEASONAL  . Cataract     BILATERAL    Past Surgical History  Procedure Laterality Date  . Abdominal hysterectomy  1989  . Nose surgery  1990  . Colonoscopy  11-20-2000  . Appendectomy  1989    with hysterectomy    Allergies  Allergen Reactions  . Tramadol Hcl     REACTION: jittery    Current Outpatient Prescriptions  Medication Sig Dispense Refill  . alendronate (FOSAMAX) 70 MG tablet Take 1 tablet by mouth every 7 days. 12 tablet 1  . ALPRAZolam (XANAX) 0.5 MG tablet TAKE ONE TABLET BY MOUTH ONCE DAILY AS NEEDED 90 tablet 1  . aspirin 81 MG tablet Take 81 mg by mouth daily.      . Calcium Carbonate (CALTRATE 600) 1500 MG TABS Take by mouth.      . cholecalciferol (VITAMIN D) 1000 UNITS tablet Take 1,000 Units by mouth daily.     . clobetasol cream (TEMOVATE) 0.05 % Apply twice a week first week then once weekly as needed 30 g 2  .  fluticasone (CUTIVATE) 0.05 % cream Apply 1 application topically 2 (two) times daily.     . fluticasone (FLONASE) 50 MCG/ACT nasal spray Place 2 sprays into both nostrils daily. 48 g 3  . levothyroxine (SYNTHROID, LEVOTHROID) 75 MCG tablet Take 1 tablet (75 mcg total) by mouth daily. 90 tablet 1  . metoprolol succinate (TOPROL-XL) 25 MG 24 hr tablet Take 1 tablet (25 mg total) by mouth daily. 90 tablet 3  . Multiple Vitamins-Minerals (MULTIVITAMIN WITH MINERALS) tablet Take 1 tablet by mouth daily.      . Omega-3 Fatty Acids (FISH OIL) 1000 MG CAPS Take 2 capsules by mouth 2 (two) times daily.    . simvastatin (ZOCOR) 20 MG tablet Take 1 tablet (20 mg total) by mouth at bedtime. 90 tablet 3  . tretinoin (RETIN-A)  0.025 % cream Apply topically at bedtime.     . vitamin C (ASCORBIC ACID) 500 MG tablet Take 500 mg by mouth daily.      . pantoprazole (PROTONIX) 40 MG tablet Take 1 tablet (40 mg total) by mouth 2 (two) times daily before a meal. (Patient not taking: Reported on 11/17/2014) 60 tablet 3   No current facility-administered medications for this visit.    Socially she is married and has 2 children and 4 grandchildren with one great grandchild. She smokes little cigars. [facility].  ROS General: Negative; No fevers, chills, or night sweats;  HEENT: Negative; No changes in vision or hearing, sinus congestion, difficulty swallowing Pulmonary: Negative; No cough, wheezing, shortness of breath, hemoptysis Cardiovascular: Negative; No chest pain, presyncope, syncope, palpitations GI: Negative; No nausea, vomiting, diarrhea, or abdominal pain GU: Negative; No dysuria, hematuria, or difficulty voiding Musculoskeletal: Occasional left leg discomfort no myalgias, joint pain, or weakness Hematologic/Oncology: Negative; no easy bruising, bleeding Endocrine: Positive for hypothyroidism, on Synthroid e; no diabetes Neuro: Negative; no changes in balance, headaches Skin: Negative; No rashes or skin lesions Psychiatric: Negative; No behavioral problems, depression Sleep: Negative; No snoring, daytime sleepiness, hypersomnolence, bruxism, restless legs, hypnogognic hallucinations, no cataplexy Other comprehensive 14 point system review is negative.   PE BP 142/80 mmHg  Pulse 52  Ht _0  (1.575 m)  Wt 149 lb (67.586 kg)  BMI 27.25 kg/m2  Repeat blood pressure by me was 130/80.  Wt Readings from Last 3 Encounters:  11/17/14 149 lb (67.586 kg)  10/24/14 147 lb (66.679 kg)  09/19/14 148 lb (67.132 kg)   General: Alert, oriented, no distress.  HEENT: Normocephalic, atraumatic. Pupils round and reactive; sclera anicteric; no xanthelasmas Nose without  nasal septal hypertrophy Mouth/Parynx benign; Mallinpatti scale 2 Neck: No JVD, no carotid bruits with normal carotid upstroke Chest wall: Nontender to palpation Lungs: clear to ausculatation and percussion; no wheezing or rales Heart: RRR, s1 s2 normal 1/6 SEM, no diastolic murmur. No S3 or S4 gallop. No rubs thrills or heaves. Abdomen: soft, nontender; no hepatosplenomehaly, BS+; abdominal aorta nontender and not dilated by palpation. Back: No CVA tenderness Pulses 2+ Extremities:no clubbinbg cyanosis or edema, Homan's sign negative  Neurologic: grossly nonfocal Psychological: Normal affect and mood  ECG (independently read by me): Sinus bradycardia 52 bpm.  Normal intervals.  No ST segment changes.  April 2016 ECG (independently read by me): Sinus bradycardia at 54 bpm.  Normal intervals.  No significant ST segment changes  September 2015 ECG (independently read by me): Sinus bradycardia 52 beats per minute.  QS complex in V1 and V2.  Nonspecific  ST change.  QTc interval 398 ms  Prior March 2015 ECG (independently read by me): Sinus bradycardia 51 beats per minute. No ectopy. Normal intervals.  Prior 09/14/2012 ECG: Sinus rhythm at 53 beats per minute. She has poor anterior R-wave progression. Normal intervals  LABS:  BMP Latest Ref Rng 04/25/2014 08/13/2013 12/16/2010  Glucose 70 - 99 mg/dL 86 75 107(H)  BUN 6 - 23 mg/dL _0 Creatinine 0.50 - 1.10 mg/dL 0.66 0.8 0.8  Sodium 135 - 145 mEq/L 139 141 143  Potassium 3.5 - 5.3 mEq/L 3.7 3.9 4.5  Chloride 96 - 112 mEq/L 104 105 109  CO2 19 - 32 mEq/L _1 Calcium 8.4 - 10.5 mg/dL 9.0 9.3 9.0    Hepatic Function Latest Ref Rng 04/25/2014 08/13/2013 12/16/2010  Total Protein 6.0 - 8.3 g/dL 6.5 6.8 6.6  Albumin 3.5 - 5.2 g/dL 3.7 3.9 3.7  AST 0 - 37 U/L _2 ALT 0 - 35 U/L _3 Alk Phosphatase 39 - 117 U/L 75 59 69  Total Bilirubin 0.2 - 1.2 mg/dL 0.3 0.9 0.3  Bilirubin, Direct 0.0 - 0.3 mg/dL - - 0.0    CBC  Latest Ref Rng 10/24/2014 04/25/2014 08/13/2013  WBC 4.0 - 10.5 K/uL 9.8 7.9 8.3  Hemoglobin 12.0 - 15.0 g/dL 13.7 13.0 13.7  Hematocrit 36.0 - 46.0 % 41.0 39.1 41.0  Platelets 150.0 - 400.0 K/uL 227.0 243 187.0   Lab Results  Component Value Date   MCV 89.9 10/24/2014   MCV 87.5 04/25/2014   MCV 90.5 08/13/2013    Lab Results  Component Value Date   TSH 1.27 10/24/2014   Lab Results  Component Value Date   HGBA1C 6.0 08/06/2012   BNP No results found for: PROBNP  Lipid Panel     Component Value Date/Time   CHOL 151 04/25/2014 0854   TRIG 218* 04/25/2014 0854   HDL 44* 04/25/2014 0854   CHOLHDL 3.4 04/25/2014 0854   VLDL 44* 04/25/2014 0854   LDLCALC 63 04/25/2014 0854     RADIOLOGY: No results found.    ASSESSMENT AND PLAN: Ms. Petrasek is a 69 year old female who has a history of palpitations that have improved with once a day long-acting beta blocker therapy.  She has not had any recurrent atrial fibrillation and  is maintaining sinus rhythm.  She is asymptomatic with reference to to being mildly bradycardic.  She denies dizziness or orthostatic symptoms.  A prior echo has revealed both normal systolic and diastolic function with mild aortic valve sclerosis without stenosis.  A nuclear perfusion study  in January 2013 showed normal perfusion without scar or ischemia and she had a post stress ejection fraction of 75%.  She has been trying to be active.  We discussed exercise.  At least 5 days per week for 30 minutes at a time if at all possible.  She has a history of GERD but recently has been holding Protonix and has felt well.  As long as she continues to remain stable, I will see her in one year for reevaluation.  Time spent: 25 minutes  Troy Sine, MD, [facility]  11/17/2014 7:19 PM

## 2014-11-17 NOTE — Patient Instructions (Signed)
Your physician wants you to follow-up in: 1 year or sooner if needed. You will receive a reminder letter in the mail two months in advance. If you don't receive a letter, please call our office to schedule the follow-up appointment.   If you need a refill on your cardiac medications before your next appointment, please call your pharmacy.   

## 2014-11-20 ENCOUNTER — Encounter: Payer: PPO | Admitting: Gynecology

## 2014-11-26 ENCOUNTER — Ambulatory Visit: Payer: PPO | Admitting: Internal Medicine

## 2014-11-27 ENCOUNTER — Encounter: Payer: Self-pay | Admitting: Gynecology

## 2014-11-27 ENCOUNTER — Ambulatory Visit (INDEPENDENT_AMBULATORY_CARE_PROVIDER_SITE_OTHER): Payer: PPO | Admitting: Gynecology

## 2014-11-27 VITALS — BP 126/80 | Ht 62.0 in | Wt 148.0 lb

## 2014-11-27 DIAGNOSIS — Z8639 Personal history of other endocrine, nutritional and metabolic disease: Secondary | ICD-10-CM

## 2014-11-27 DIAGNOSIS — M810 Age-related osteoporosis without current pathological fracture: Secondary | ICD-10-CM

## 2014-11-27 DIAGNOSIS — Z01419 Encounter for gynecological examination (general) (routine) without abnormal findings: Secondary | ICD-10-CM

## 2014-11-27 MED ORDER — ALENDRONATE SODIUM 70 MG PO TABS: 70.0000 mg | ORAL_TABLET | ORAL | Status: DC

## 2014-11-27 NOTE — Patient Instructions (Signed)

## 2014-11-27 NOTE — Progress Notes (Signed)
[place] 10/21/1945 [id number]  History:    69 y.o.  for annual gyn exam who was seen in the office in January of this year to discuss her bone density study which was compared with study in 2013. As a result of history of osteoporosis her primary physician had placed on Fosamax 70 mg every weekly which she initiated in 2010. She smokes half a pack cigarette per day for many years despite having been counseled in the past. Patient had been prescribed anti-cholinergic last year for overactive bladder and she is no longer taking it and has no complaints.  Patient's bone density study December 2015 demonstrated the following: Bone density study done 12/24/2013 demonstrated that the lowest T score was -2.5 at the right femoral neck with decrease in bone mineralization of -2.3%. This study was compared at the same facility with study from 2013 and radiologist had documented that there was no statistically significant change.  Her PCP has been doing her blood work. Patient with past history vitamin D deficiency hasn't been tested in 2 years.Patient with past history of total abdominal hysterectomy with appendectomy secondary to leiomyomatous uteri dysmenorrhea and menorrhagia. Patient declined flu vaccine. All her other vaccines are up-to-date. Normal colonoscopy 2014   Past medical history,surgical history, family history and social history were all reviewed and documented in the EPIC chart.  Gynecologic History No LMP recorded. Patient has had a hysterectomy. Contraception: post menopausal status Last Pap: 2015. Results were: normal Last mammogram: 2015. Results were: normal  Obstetric History OB History  Gravida Para Term Preterm AB SAB TAB Ectopic Multiple Living  3 3        3     # Outcome Date GA Lbr Len/2nd Weight Sex Delivery Anes PTL Lv  3 Para           2 Para           1 Para                ROS: A ROS was performed and pertinent positives and negatives are included in the  history.  GENERAL: No fevers or chills. HEENT: No change in vision, no earache, sore throat or sinus congestion. NECK: No pain or stiffness. CARDIOVASCULAR: No chest pain or pressure. No palpitations. PULMONARY: No shortness of breath, cough or wheeze. GASTROINTESTINAL: No abdominal pain, nausea, vomiting or diarrhea, melena or bright red blood per rectum. GENITOURINARY: No urinary frequency, urgency, hesitancy or dysuria. MUSCULOSKELETAL: No joint or muscle pain, no back pain, no recent trauma. DERMATOLOGIC: No rash, no itching, no lesions. ENDOCRINE: No polyuria, polydipsia, no heat or cold intolerance. No recent change in weight. HEMATOLOGICAL: No anemia or easy bruising or bleeding. NEUROLOGIC: No headache, seizures, numbness, tingling or weakness. PSYCHIATRIC: No depression, no loss of interest in normal activity or change in sleep pattern.     Exam: chaperone present  BP 126/80 mmHg  Ht 5\' 2"  (1.575 m)  Wt 148 lb (67.132 kg)  BMI 27.06 kg/m2  Body mass index is 27.06 kg/(m^2).  General appearance : Well developed well nourished female. No acute distress HEENT: Eyes: no retinal hemorrhage or exudates,  Neck supple, trachea midline, no carotid bruits, no thyroidmegaly Lungs: Clear to auscultation, no rhonchi or wheezes, or rib retractions  Heart: Regular rate and rhythm, no murmurs or gallops Breast:Examined in sitting and supine position were symmetrical in appearance, no palpable masses or tenderness,  no skin retraction, no nipple inversion, no nipple discharge, no skin discoloration, no  axillary or supraclavicular lymphadenopathy Abdomen: no palpable masses or tenderness, no rebound or guarding Extremities: no edema or skin discoloration or tenderness  Pelvic:  Bartholin, Urethra, Skene Glands: Within normal limits             Vagina: No gross lesions or discharge  Cervix: Absent  Uterus  absent  Adnexa  Without masses or tenderness  Anus and perineum  normal   Rectovaginal   normal sphincter tone without palpated masses or tenderness             Hemoccult PCP provides     Assessment/Plan:  69 y.o. female for annual exam with history of osteoporosis on Fosamax 70 mg every weekly. We will look at next year's bone density study and if stable we had discussed stopping the Fosamax and going on a drug-free holiday. I am concerned because she continues to smoke half a pack cigarette per day despite counseling on numerous occasions. She was reminded to schedule her mammogram which is overdue. We discussed importance of calcium vitamin D and regular exercise. Pap smear not done today according to the new guidelines.   Terrance Mass MD, 1:14 PM 11/27/2014

## 2014-11-28 ENCOUNTER — Encounter: Payer: Self-pay | Admitting: Gynecology

## 2014-11-28 LAB — VITAMIN D 25 HYDROXY (VIT D DEFICIENCY, FRACTURES): Vit D, 25-Hydroxy: 23 ng/mL — ABNORMAL LOW (ref 30–100)

## 2014-12-02 ENCOUNTER — Other Ambulatory Visit: Payer: Self-pay | Admitting: Gynecology

## 2014-12-02 DIAGNOSIS — E559 Vitamin D deficiency, unspecified: Secondary | ICD-10-CM

## 2014-12-02 MED ORDER — VITAMIN D (ERGOCALCIFEROL) 1.25 MG (50000 UNIT) PO CAPS: 50000.0000 [IU] | ORAL_CAPSULE | ORAL | Status: DC

## 2014-12-26 ENCOUNTER — Ambulatory Visit
Admission: RE | Admit: 2014-12-26 | Discharge: 2014-12-26 | Disposition: A | Discharge status: Home / Self Care | Payer: PPO | Source: Ambulatory Visit

## 2014-12-26 DIAGNOSIS — Z1231 Encounter for screening mammogram for malignant neoplasm of breast: Secondary | ICD-10-CM

## 2015-01-08 ENCOUNTER — Other Ambulatory Visit: Payer: Self-pay | Admitting: Gynecology

## 2015-03-13 ENCOUNTER — Other Ambulatory Visit: Payer: Self-pay | Admitting: *Deleted

## 2015-03-13 ENCOUNTER — Telehealth: Payer: Self-pay | Admitting: Internal Medicine

## 2015-03-13 MED ORDER — LEVOTHYROXINE SODIUM 75 MCG PO TABS: 75.0000 ug | ORAL_TABLET | Freq: Every day | ORAL | Status: DC

## 2015-03-13 MED ORDER — SULFAMETHOXAZOLE-TRIMETHOPRIM 800-160 MG PO TABS: 1.0000 | ORAL_TABLET | Freq: Two times a day (BID) | ORAL | Status: DC

## 2015-03-13 MED ORDER — ALPRAZOLAM 0.5 MG PO TABS: ORAL_TABLET | ORAL | Status: DC

## 2015-03-13 MED ORDER — ALENDRONATE SODIUM 70 MG PO TABS: ORAL_TABLET | ORAL | Status: DC

## 2015-03-13 NOTE — Telephone Encounter (Signed)
Pt request refill  ALPRAZolam (XANAX) 0.5 MG tablet levothyroxine (SYNTHROID, LEVOTHROID) 75 MCG tablet  90 days  [facility]  Pt only has 3 tabs of levothyroxine (SYNTHROID, LEVOTHROID) 75 MCG tablet left So [facility] pt to go to [facility]/ [place]   and get a 10 day rx Phone [phone]  Pt request refill  sulfamethoxazole-trimethoprim (BACTRIM DS) 800-160 MG per tablet Please send to [facility]  too,  because she only takes as needed

## 2015-03-13 NOTE — Telephone Encounter (Signed)
Pt called for refill on fosamax 70 mg, Rx sent to mail order

## 2015-03-13 NOTE — Telephone Encounter (Signed)
rx sent/faxed

## 2015-03-13 NOTE — Telephone Encounter (Signed)
Okay to refill? 

## 2015-03-23 ENCOUNTER — Other Ambulatory Visit: Payer: PPO

## 2015-03-23 DIAGNOSIS — E559 Vitamin D deficiency, unspecified: Secondary | ICD-10-CM | POA: Diagnosis not present

## 2015-03-24 LAB — VITAMIN D 25 HYDROXY (VIT D DEFICIENCY, FRACTURES): Vit D, 25-Hydroxy: 35 ng/mL (ref 30–100)

## 2015-07-30 ENCOUNTER — Telehealth: Payer: Self-pay | Admitting: Cardiovascular Disease

## 2015-07-30 ENCOUNTER — Other Ambulatory Visit: Payer: Self-pay | Admitting: Internal Medicine

## 2015-07-30 ENCOUNTER — Other Ambulatory Visit: Payer: Self-pay | Admitting: *Deleted

## 2015-07-30 ENCOUNTER — Other Ambulatory Visit: Payer: Self-pay

## 2015-07-30 MED ORDER — METOPROLOL SUCCINATE ER 25 MG PO TB24: 25.0000 mg | ORAL_TABLET | Freq: Every day | ORAL | Status: DC

## 2015-07-30 NOTE — Telephone Encounter (Signed)
New message       *STAT* If patient is at the pharmacy, call can be transferred to refill team.   1. Which medications need to be refilled? (please list name of each medication and dose if known) Metoprolol 25 mg po daily 2. Which pharmacy/location (including street and city if local pharmacy) is medication to be sent to?[facility] in [place]  3. Do they need a 30 day or 90 day supply? 30 day supply  The pt is completely out of medication

## 2015-07-30 NOTE — Telephone Encounter (Signed)
New message      Pt c/o Shortness Of Breath: STAT if SOB developed within the last 24 hours or pt is noticeably SOB on the phone  1. Are you currently SOB (can you hear that pt is SOB on the phone)? no  2. How long have you been experiencing SOB? The past couple of weeks  3. Are you SOB when sitting or when up moving around? Pt was lying down  4. Are you currently experiencing any other symptoms? The pt said she congestion woke her up  She said she is having congestion and not felling right was instructed to come in and the pt states she wants to see the Dr. Claiborne Billings, she was a earlier appt

## 2015-07-30 NOTE — Telephone Encounter (Signed)
I spoke with pt. She reports for the last couple of months she has been waking up with a clear productive cough in the morning. Will sometimes wake her up at night. Does not happen every day. Yesterday morning she was short of breath when she woke up and after coughing up mucous she felt better and was able to do normal activities.  Only episode of shortness of breath was yesterday morning.  No chest pain. No other symptoms.  I asked her to contact primary care to schedule appt for evaluation of these symptoms. I told her once evaluated by primary care we could schedule her an earlier appt with Dr. Claiborne Billings if primary care felt it was indicated.   I also told pt I would send message to Dr. Claiborne Billings to make him aware.

## 2015-07-30 NOTE — Telephone Encounter (Signed)
Patient would like a ten day supply as she is out of the medication. She would also like a long term rx [facility] to [facility].

## 2015-07-30 NOTE — Telephone Encounter (Signed)
rx sent to pharmacy

## 2015-07-31 ENCOUNTER — Other Ambulatory Visit: Payer: Self-pay | Admitting: *Deleted

## 2015-07-31 MED ORDER — SIMVASTATIN 20 MG PO TABS: 20.0000 mg | ORAL_TABLET | Freq: Every day | ORAL | Status: DC

## 2015-07-31 MED ORDER — METOPROLOL SUCCINATE ER 25 MG PO TB24: 25.0000 mg | ORAL_TABLET | Freq: Every day | ORAL | Status: DC

## 2015-07-31 NOTE — Telephone Encounter (Signed)
REFILL 

## 2015-09-09 DIAGNOSIS — H2513 Age-related nuclear cataract, bilateral: Secondary | ICD-10-CM | POA: Diagnosis not present

## 2015-09-09 DIAGNOSIS — D3131 Benign neoplasm of right choroid: Secondary | ICD-10-CM | POA: Diagnosis not present

## 2015-09-09 DIAGNOSIS — H43813 Vitreous degeneration, bilateral: Secondary | ICD-10-CM | POA: Diagnosis not present

## 2015-09-10 ENCOUNTER — Ambulatory Visit (INDEPENDENT_AMBULATORY_CARE_PROVIDER_SITE_OTHER): Payer: PPO | Admitting: Adult Health

## 2015-09-10 ENCOUNTER — Telehealth: Payer: Self-pay | Admitting: Internal Medicine

## 2015-09-10 ENCOUNTER — Encounter: Payer: Self-pay | Admitting: Adult Health

## 2015-09-10 VITALS — BP 132/80 | Ht 62.0 in | Wt 145.4 lb

## 2015-09-10 DIAGNOSIS — H5789 Other specified disorders of eye and adnexa: Secondary | ICD-10-CM

## 2015-09-10 DIAGNOSIS — H578 Other specified disorders of eye and adnexa: Secondary | ICD-10-CM | POA: Diagnosis not present

## 2015-09-10 DIAGNOSIS — T148 Other injury of unspecified body region: Secondary | ICD-10-CM | POA: Diagnosis not present

## 2015-09-10 DIAGNOSIS — W57XXXA Bitten or stung by nonvenomous insect and other nonvenomous arthropods, initial encounter: Secondary | ICD-10-CM | POA: Diagnosis not present

## 2015-09-10 MED ORDER — DOXYCYCLINE HYCLATE 100 MG PO CAPS: 100.0000 mg | ORAL_CAPSULE | Freq: Two times a day (BID) | ORAL | 0 refills | Status: DC

## 2015-09-10 NOTE — Progress Notes (Signed)
   Subjective:    Patient ID: Haley [name], female    DOB: 14-Aug-1945, 70 y.o.   MRN: [id number]  HPI  70 year old female who presents to the office today for a tick bite. She reports that 2 days ago she noticed a tick on the left side of her head. She is unsure of how long the tick was attached. She was able to remove the tick in full. Since she removed the tick she has noticed that the area has been red and tender to touch. Yesterday evening the area under her eye started swelling.   She denies any fevers, body or joint aches, feeling ill or blurred vision.     Review of Systems  Constitutional: Negative.   HENT: Negative.   Eyes:       Swelling to left eye   Respiratory: Negative.   Cardiovascular: Negative.   Musculoskeletal: Negative.   Skin: Positive for color change and wound.  All other systems reviewed and are negative.      Objective:   Physical Exam  Constitutional: She is oriented to person, place, and time. She appears well-developed and well-nourished. No distress.  Eyes: Conjunctivae and EOM are normal. Pupils are equal, round, and reactive to light. Right eye exhibits no discharge. Left eye exhibits no discharge. No scleral icterus.  Cardiovascular: Normal rate, regular rhythm, normal heart sounds and intact distal pulses.  Exam reveals no gallop and no friction rub.   No murmur heard. Pulmonary/Chest: Effort normal and breath sounds normal. No respiratory distress. She has no wheezes. She has no rales. She exhibits no tenderness.  Neurological: She is alert and oriented to person, place, and time.  Skin: Skin is warm and dry. No rash noted. She is not diaphoretic. There is erythema. No pallor.  A small wound and redness noted in hair line above left temple. No bulls eye rash noted. No full body rash. Trace swelling under left eye. No bruising noted.   Psychiatric: She has a normal mood and affect. Her behavior is normal. Judgment and thought content normal.    Nursing note and vitals reviewed.     Assessment & Plan:  1. Tick bite - Will cover for tick borne illness.  - doxycycline (VIBRAMYCIN) 100 MG capsule; Take 1 capsule (100 mg total) by mouth 2 (two) times daily.  Dispense: 10 capsule; Refill: 0 - Information given on tick borne illness and she was advised to follow up if she has any symptoms.  2. Eye swelling, left - she refused prednisone - Can take benadryl at night - Use a cold pack to help with swelling - Follow up if swelling becomes worse.   Dorothyann Peng, NP

## 2015-09-10 NOTE — Telephone Encounter (Signed)
Patient Name: Haley [name]  DOB: 1945/07/27    Initial Comment Caller states got a tick bite, got the tick out   Nurse Assessment  Nurse: Raphael Gibney, RN, Vanita Ingles Date/Time (Eastern Time): 09/10/2015 1:46:57 PM  Confirm and document reason for call. If symptomatic, describe symptoms. You must click the next button to save text entered. ---Caller states she had tick bite on Tuesday night on the left side of her forehead. She removed the tick. Left side of face is swollen and her eye is almost swollen shut. Left side of face is red. Not sure about she has a fever.  Has the patient traveled out of the country within the last 30 days? ---Not Applicable  Does the patient have any new or worsening symptoms? ---Yes  Will a triage be completed? ---Yes  Related visit to physician within the last 2 weeks? ---No  Does the PT have any chronic conditions? (i.e. diabetes, asthma, etc.) ---Yes  List chronic conditions. ---thyroid;  Is this a behavioral health or substance abuse call? ---No     Guidelines    Guideline Title Affirmed Question Affirmed Notes  Tick Bite [1] Red or very tender (to touch) area AND [2] started over 24 hours after the bite    Final Disposition User   See Physician within 24 Hours [place], RN, Vanita Ingles    Comments  appt scheduled with Dr. Alysia Penna at 10:30 am on 09/11/15   Referrals  REFERRED TO PCP OFFICE   Disagree/Comply: Comply

## 2015-09-10 NOTE — Telephone Encounter (Signed)
Please call pt and schedule her with [facility] today. Pt should not wait till tomorrow.

## 2015-09-10 NOTE — Telephone Encounter (Signed)
Tried another number and was able to contact pt.  Pt will be in today to see [facility].

## 2015-09-10 NOTE — Telephone Encounter (Signed)
Left message for the pt to call back.

## 2015-09-10 NOTE — Patient Instructions (Addendum)
It was great meeting you today.   I have sent in a prescription for doxycycline. Take this twice a day for 5 days.   You can take Benadryl to help with the swelling of the eye.   Please follow up if you have any signs of Tick bourne illness   Tick Bite Information Ticks are insects that attach themselves to the skin and draw blood for food. There are various types of ticks. Common types include wood ticks and deer ticks. Most ticks live in shrubs and grassy areas. Ticks can climb onto your body when you make contact with leaves or grass where the tick is waiting. The most common places on the body for ticks to attach themselves are the scalp, neck, armpits, waist, and groin. Most tick bites are harmless, but sometimes ticks carry germs that cause diseases. These germs can be spread to a person during the tick's feeding process. The chance of a disease spreading through a tick bite depends on:   The type of tick.  Time of year.   How long the tick is attached.   Geographic location.  HOW CAN YOU PREVENT TICK BITES? Take these steps to help prevent tick bites when you are outdoors:  Wear protective clothing. Long sleeves and long pants are best.   Wear white clothes so you can see ticks more easily.  Tuck your pant legs into your socks.   If walking on a trail, stay in the middle of the trail to avoid brushing against bushes.  Avoid walking through areas with long grass.  Put insect repellent on all exposed skin and along boot tops, pant legs, and sleeve cuffs.   Check clothing, hair, and skin repeatedly and before going inside.   Brush off any ticks that are not attached.  Take a shower or bath as soon as possible after being outdoors.  WHAT IS THE PROPER WAY TO REMOVE A TICK? Ticks should be removed as soon as possible to help prevent diseases caused by tick bites. 1. If latex gloves are available, put them on before trying to remove a tick.  2. Using fine-point  tweezers, grasp the tick as close to the skin as possible. You may also use curved forceps or a tick removal tool. Grasp the tick as close to its head as possible. Avoid grasping the tick on its body. 3. Pull gently with steady upward pressure until the tick lets go. Do not twist the tick or jerk it suddenly. This may break off the tick's head or mouth parts. 4. Do not squeeze or crush the tick's body. This could force disease-carrying fluids from the tick into your body.  5. After the tick is removed, wash the bite area and your hands with soap and water or other disinfectant such as alcohol. 6. Apply a small amount of antiseptic cream or ointment to the bite site.  7. Wash and disinfect any instruments that were used.  Do not try to remove a tick by applying a hot match, petroleum jelly, or fingernail polish to the tick. These methods do not work and may increase the chances of disease being spread from the tick bite.  WHEN SHOULD YOU SEEK MEDICAL CARE? Contact your health care provider if you are unable to remove a tick from your skin or if a part of the tick breaks off and is stuck in the skin.  After a tick bite, you need to be aware of signs and symptoms that could be related  to diseases spread by ticks. Contact your health care provider if you develop any of the following in the days or weeks after the tick bite:  Unexplained fever.  Rash. A circular rash that appears days or weeks after the tick bite may indicate the possibility of Lyme disease. The rash may resemble a target with a bull's-eye and may occur at a different part of your body than the tick bite.  Redness and swelling in the area of the tick bite.   Tender, swollen lymph glands.   Diarrhea.   Weight loss.   Cough.   Fatigue.   Muscle, joint, or bone pain.   Abdominal pain.   Headache.   Lethargy or a change in your level of consciousness.  Difficulty walking or moving your legs.   Numbness in the  legs.   Paralysis.  Shortness of breath.   Confusion.   Repeated vomiting.    This information is not intended to replace advice given to you by your health care provider. Make sure you discuss any questions you have with your health care provider.   Document Released: 12/25/1999 Document Revised: 01/17/2014 Document Reviewed: 06/06/2012 Elsevier Interactive Patient Education [employer].

## 2015-09-11 ENCOUNTER — Ambulatory Visit: Payer: Self-pay | Admitting: Family Medicine

## 2015-10-09 ENCOUNTER — Encounter: Payer: Self-pay | Admitting: Internal Medicine

## 2015-10-09 ENCOUNTER — Ambulatory Visit (INDEPENDENT_AMBULATORY_CARE_PROVIDER_SITE_OTHER): Payer: PPO | Admitting: Internal Medicine

## 2015-10-09 VITALS — BP 120/72 | HR 57 | Temp 98.3°F | Resp 20 | Ht 62.0 in | Wt 145.0 lb

## 2015-10-09 DIAGNOSIS — M722 Plantar fascial fibromatosis: Secondary | ICD-10-CM | POA: Diagnosis not present

## 2015-10-09 DIAGNOSIS — M810 Age-related osteoporosis without current pathological fracture: Secondary | ICD-10-CM

## 2015-10-09 DIAGNOSIS — E785 Hyperlipidemia, unspecified: Secondary | ICD-10-CM

## 2015-10-09 DIAGNOSIS — E038 Other specified hypothyroidism: Secondary | ICD-10-CM

## 2015-10-09 NOTE — Patient Instructions (Addendum)

## 2015-10-09 NOTE — Progress Notes (Signed)
Subjective:    Patient ID: Haley [name], female    DOB: [birth year], 70 y.o.   MRN: [id number]  HPI  70 year old patient who presents today with a chief complaint of bilateral heel pain.  This has been present for a number of weeks.  She also complains of knee and calf discomfort.  She has a history of anxiety, hypothyroidism and osteoporosis.  Past Medical History:  Diagnosis Date  . Allergy    SEASONAL  . Cataract    BILATERAL  . Fibromyalgia   . GERD (gastroesophageal reflux disease)   . H/O echocardiogram 04/28/08   EF>55% trace mitral regurgitation, No significant valvular pathology  . Hemorrhoids    internal  . History of stress test 02/08/2011   Normal Myocardial perfusion study, this is a low risk scan, No prior study available for comparison  . Hyperlipidemia   . MVP (mitral valve prolapse)    mild and MR  . OA (osteoarthritis)   . Osteoporosis   . Paroxysmal a-fib ([place])   . Thyroid disease   . Varicosities of leg   . Vitamin D deficiency      Social History   Social History  . Marital status: Married    Spouse name: N/A  . Number of children: N/A  . Years of education: N/A   Occupational History  . Not on file.   Social History Main Topics  . Smoking status: Current Some Day Smoker    Types: Cigars  . Smokeless tobacco: Never Used     Comment: peach flavor   . Alcohol use No  . Drug use: No  . Sexual activity: Not on file   Other Topics Concern  . Not on file   Social History Narrative  . No narrative on file    Past Surgical History:  Procedure Laterality Date  . ABDOMINAL HYSTERECTOMY  1989  . APPENDECTOMY  1989   with hysterectomy  . COLONOSCOPY  11-20-2000  . NOSE SURGERY  1990    Family History  Problem Relation Age of Onset  . Diabetes Daughter   . Diabetes Mother   . Hypertension Mother   . Heart disease Mother   . Stroke Maternal Grandmother   . Colon cancer Neg Hx     Allergies  Allergen Reactions  . Tramadol Hcl       REACTION: jittery    Current Outpatient Prescriptions on File Prior to Visit  Medication Sig Dispense Refill  . alendronate (FOSAMAX) 70 MG tablet Take 1 tablet (70 mg total) by mouth once a week. Take with a full glass of water on an empty stomach. 12 tablet 3  . ALPRAZolam (XANAX) 0.5 MG tablet TAKE ONE TABLET BY MOUTH ONCE DAILY AS NEEDED 90 tablet 1  . aspirin 81 MG tablet Take 81 mg by mouth daily.      . Calcium Carbonate (CALTRATE 600) 1500 MG TABS Take by mouth.      . cholecalciferol (VITAMIN D) 1000 UNITS tablet Take 1,000 Units by mouth daily.     . clobetasol cream (TEMOVATE) 0.05 % Apply twice a week first week then once weekly as needed 30 g 2  . fluticasone (CUTIVATE) 0.05 % cream Apply 1 application topically 2 (two) times daily.     . fluticasone (FLONASE) 50 MCG/ACT nasal spray Place 2 sprays into both nostrils daily. 48 g 3  . levothyroxine (SYNTHROID, LEVOTHROID) 75 MCG tablet Take 1 tablet by mouth every day 90 tablet 0  .  metoprolol succinate (TOPROL-XL) 25 MG 24 hr tablet Take 1 tablet (25 mg total) by mouth daily. KEEP OV. 90 tablet 0  . Multiple Vitamins-Minerals (MULTIVITAMIN WITH MINERALS) tablet Take 1 tablet by mouth daily.      . Omega-3 Fatty Acids (FISH OIL) 1000 MG CAPS Take 2 capsules by mouth 2 (two) times daily.    . simvastatin (ZOCOR) 20 MG tablet Take 1 tablet (20 mg total) by mouth at bedtime. 90 tablet 0  . tretinoin (RETIN-A) 0.025 % cream Apply topically at bedtime.     . vitamin C (ASCORBIC ACID) 500 MG tablet Take 500 mg by mouth daily.       No current facility-administered medications on file prior to visit.     BP 120/72 (BP Location: Left Arm, Patient Position: Sitting, Cuff Size: Normal)   Pulse (!) 57   Temp 98.3 F (36.8 C) (Oral)   Resp 20   Ht 5\' 2"  (1.575 m)   Wt 145 lb (65.8 kg)   SpO2 98%   BMI 26.52 kg/m     Review of Systems  Constitutional: Negative.   HENT: Negative for congestion, dental problem, hearing loss,  rhinorrhea, sinus pressure, sore throat and tinnitus.   Eyes: Negative for pain, discharge and visual disturbance.  Respiratory: Negative for cough and shortness of breath.   Cardiovascular: Negative for chest pain, palpitations and leg swelling.  Gastrointestinal: Negative for abdominal distention, abdominal pain, blood in stool, constipation, diarrhea, nausea and vomiting.  Genitourinary: Negative for difficulty urinating, dysuria, flank pain, frequency, hematuria, pelvic pain, urgency, vaginal bleeding, vaginal discharge and vaginal pain.  Musculoskeletal: Positive for arthralgias and myalgias. Negative for gait problem and joint swelling.  Skin: Negative for rash.  Neurological: Negative for dizziness, syncope, speech difficulty, weakness, numbness and headaches.  Hematological: Negative for adenopathy.  Psychiatric/Behavioral: Negative for agitation, behavioral problems and dysphoric mood. The patient is nervous/anxious.        Objective:   Physical Exam  Constitutional: She is oriented to person, place, and time. She appears well-developed and well-nourished.  HENT:  Head: Normocephalic.  Right Ear: External ear normal.  Left Ear: External ear normal.  Mouth/Throat: Oropharynx is clear and moist.  Eyes: Conjunctivae and EOM are normal. Pupils are equal, round, and reactive to light.  Neck: Normal range of motion. Neck supple. No thyromegaly present.  Cardiovascular: Normal rate, regular rhythm, normal heart sounds and intact distal pulses.   Pulmonary/Chest: Effort normal and breath sounds normal.  Abdominal: Soft. Bowel sounds are normal. She exhibits no mass. There is no tenderness.  Musculoskeletal: Normal range of motion.  No heel pain or tenderness Pedal pulses full  Vibratory sensation and monofilament testing intact  Lymphadenopathy:    She has no cervical adenopathy.  Neurological: She is alert and oriented to person, place, and time.  Skin: Skin is warm and dry. No  rash noted.  Psychiatric: She has a normal mood and affect. Her behavior is normal.          Assessment & Plan:   Probable plantar fasciitis.  Patient was given rehabilitation exercises to perform and general information.  She will call if unimproved Hypothyroidism Osteoporosis  Flu vaccine, declined Schedule CPX 6 months  Tyriek Hofman Pilar Plate

## 2015-10-15 DIAGNOSIS — D225 Melanocytic nevi of trunk: Secondary | ICD-10-CM | POA: Diagnosis not present

## 2015-10-15 DIAGNOSIS — Z1283 Encounter for screening for malignant neoplasm of skin: Secondary | ICD-10-CM | POA: Diagnosis not present

## 2015-10-15 DIAGNOSIS — X32XXXD Exposure to sunlight, subsequent encounter: Secondary | ICD-10-CM | POA: Diagnosis not present

## 2015-10-15 DIAGNOSIS — L570 Actinic keratosis: Secondary | ICD-10-CM | POA: Diagnosis not present

## 2015-10-15 DIAGNOSIS — L718 Other rosacea: Secondary | ICD-10-CM | POA: Diagnosis not present

## 2015-10-28 ENCOUNTER — Telehealth: Payer: Self-pay | Admitting: Cardiovascular Disease

## 2015-10-28 ENCOUNTER — Telehealth: Payer: Self-pay | Admitting: Internal Medicine

## 2015-10-28 NOTE — Telephone Encounter (Signed)
°*  STAT* If patient is at the pharmacy, call can be transferred to refill team.   1. Which medications need to be refilled? (please list name of each medication and dose if known) metroprolol 25mg  and simathatin 25mg  2. Which pharmacy/location (including street and city if local pharmacy) is medication to be sent to? Mail in order [phone]  3. Do they need a 30 day or 90 day supply? [place]

## 2015-10-28 NOTE — Telephone Encounter (Signed)
Pt need new Rx for fluticasone, alprazolam #90 and levothyroxine  Pharm:  [facility]    [phone] (F)

## 2015-10-29 ENCOUNTER — Other Ambulatory Visit: Payer: Self-pay

## 2015-10-29 MED ORDER — METOPROLOL SUCCINATE ER 25 MG PO TB24: 25.0000 mg | ORAL_TABLET | Freq: Every day | ORAL | 0 refills | Status: DC

## 2015-10-30 MED ORDER — LEVOTHYROXINE SODIUM 75 MCG PO TABS: 75.0000 ug | ORAL_TABLET | Freq: Every day | ORAL | 3 refills | Status: DC

## 2015-10-30 MED ORDER — ALPRAZOLAM 0.5 MG PO TABS: ORAL_TABLET | ORAL | 3 refills | Status: DC

## 2015-10-30 MED ORDER — FLUTICASONE PROPIONATE 50 MCG/ACT NA SUSP: 2.0000 | Freq: Every day | NASAL | 3 refills | Status: DC

## 2015-10-30 NOTE — Telephone Encounter (Signed)
Left detailed message on personal voicemail Rx's sent to mail order as requested. Any questions please call office. Rx's faxed to [facility].

## 2015-11-16 ENCOUNTER — Other Ambulatory Visit: Payer: Self-pay | Admitting: Internal Medicine

## 2015-11-16 DIAGNOSIS — Z1231 Encounter for screening mammogram for malignant neoplasm of breast: Secondary | ICD-10-CM

## 2015-11-17 ENCOUNTER — Ambulatory Visit (INDEPENDENT_AMBULATORY_CARE_PROVIDER_SITE_OTHER): Payer: PPO | Admitting: Cardiovascular Disease

## 2015-11-17 ENCOUNTER — Encounter: Payer: Self-pay | Admitting: Cardiovascular Disease

## 2015-11-17 VITALS — BP 147/82 | HR 52 | Ht 62.0 in | Wt 144.8 lb

## 2015-11-17 DIAGNOSIS — R002 Palpitations: Secondary | ICD-10-CM

## 2015-11-17 DIAGNOSIS — E038 Other specified hypothyroidism: Secondary | ICD-10-CM | POA: Diagnosis not present

## 2015-11-17 DIAGNOSIS — I480 Paroxysmal atrial fibrillation: Secondary | ICD-10-CM | POA: Diagnosis not present

## 2015-11-17 DIAGNOSIS — E785 Hyperlipidemia, unspecified: Secondary | ICD-10-CM | POA: Diagnosis not present

## 2015-11-17 LAB — COMPREHENSIVE METABOLIC PANEL
ALT: 8 U/L (ref 6–29)
AST: 13 U/L (ref 10–35)
Albumin: 3.8 g/dL (ref 3.6–5.1)
Alkaline Phosphatase: 67 U/L (ref 33–130)
BUN: 9 mg/dL (ref 7–25)
CHLORIDE: 106 mmol/L (ref 98–110)
CO2: 28 mmol/L (ref 20–31)
Calcium: 9.3 mg/dL (ref 8.6–10.4)
Creat: 0.76 mg/dL (ref 0.60–0.93)
GLUCOSE: 84 mg/dL (ref 65–99)
POTASSIUM: 4.1 mmol/L (ref 3.5–5.3)
Sodium: 140 mmol/L (ref 135–146)
TOTAL PROTEIN: 6.5 g/dL (ref 6.1–8.1)
Total Bilirubin: 0.4 mg/dL (ref 0.2–1.2)

## 2015-11-17 LAB — TSH: TSH: 1.94 mIU/L

## 2015-11-17 LAB — CBC
HCT: 41.3 % (ref 35.0–45.0)
Hemoglobin: 13.4 g/dL (ref 11.7–15.5)
MCH: 29.6 pg (ref 27.0–33.0)
MCHC: 32.4 g/dL (ref 32.0–36.0)
MCV: 91.2 fL (ref 80.0–100.0)
MPV: 10.1 fL (ref 7.5–12.5)
PLATELETS: 222 10*3/uL (ref 140–400)
RBC: 4.53 MIL/uL (ref 3.80–5.10)
RDW: 14.3 % (ref 11.0–15.0)
WBC: 8.7 10*3/uL (ref 3.8–10.8)

## 2015-11-17 LAB — LIPID PANEL
CHOL/HDL RATIO: 3 ratio (ref <5.0)
CHOLESTEROL: 152 mg/dL (ref <200)
HDL: 51 mg/dL (ref >50)
LDL CALC: 64 mg/dL
Triglycerides: 184 mg/dL — ABNORMAL HIGH (ref <150)
VLDL: 37 mg/dL — AB (ref <30)

## 2015-11-17 MED ORDER — HYDROCHLOROTHIAZIDE 12.5 MG PO TABS: 12.5000 mg | ORAL_TABLET | ORAL | 6 refills | Status: DC

## 2015-11-17 NOTE — Patient Instructions (Signed)
Your physician recommends that you return for lab work in: [place].  Your physician has recommended you make the following change in your medication: start hydrochlorothiazide 12.5 mg prescription.  Your physician wants you to follow-up in: 1 year or sooner if needed. You will receive a reminder letter in the mail two months in advance. If you don't receive a letter, please call our office to schedule the follow-up appointment.  If you need a refill on your cardiac medications before your next appointment, please call your pharmacy.

## 2015-11-18 NOTE — Progress Notes (Signed)
Patient ID: Haley [name], female   DOB: July 23, 1945, 70 y.o.   MRN: [id number]    PCP: Dr. Bluford Kaufmann  HPI: Haley [name] is a 70 y.o. female presents to the office today for a 12 month followup evaluation.  Haley [name]  has a history of palpitations, paroxysmal atrial fibrillation, hyperlipidemia, GERD, and hypothyroidism. She has been found to have an atherogenic dyslipidemia lipid panel. She also has a history of lower extremity varicosities and wears support stockings. Remotely she was having difficulty remembering to take her metoprolol on a twice a day regimen at that time I switched her to metoprolol succinate Toprol-XL 25 mg daily. She has felt markedly improved with reference to taking this once a day preparation. She is unaware of any breakthrough tachycardia palpitations. She denies presyncope or syncope. An NMR lipoprofile  showed marked improvement with a cholesterol of 174, triglycerides 119, HDL C. cholesterol 60, and LDL cholesterol at 90. Her LDL particle number was 1020 and her HDL particle number was 39.5. Her insulin resistance score was 39 which was normal.   Laboratory last year showed a total cholesterol 165, triglycerides 100, HDL 54, LDL 91.  TSH was 2.03.  She was not anemic.  She normal chemistry profile.  Since I last saw her, she denies any episodes of chest pain or significant palpitations.  She admits to occasional leg cramps.  She also at times note some very mild left ankle swelling.  She has had issues with bladder incontinence and sees Dr. Toney Rakes. She has been taking Toprol 25 mg daily.  She is tolerated simvastatin 20 mg for hyperlipidemia.  She is on 75 g of levothyroxine for hypothyroidism.  She also has osteopenia for which she takes Fosamax.  She has not had recent lab work.  She presents for evaluation.  Past Medical History:  Diagnosis Date  . Allergy    SEASONAL  . Cataract    BILATERAL  . Fibromyalgia   . GERD (gastroesophageal reflux disease)    . H/O echocardiogram 04/28/08   EF>55% trace mitral regurgitation, No significant valvular pathology  . Hemorrhoids    internal  . History of stress test 02/08/2011   Normal Myocardial perfusion study, this is a low risk scan, No prior study available for comparison  . Hyperlipidemia   . MVP (mitral valve prolapse)    mild and MR  . OA (osteoarthritis)   . Osteoporosis   . Paroxysmal a-fib ([place])   . Thyroid disease   . Varicosities of leg   . Vitamin D deficiency     Past Surgical History:  Procedure Laterality Date  . ABDOMINAL HYSTERECTOMY  1989  . APPENDECTOMY  1989   with hysterectomy  . COLONOSCOPY  11-20-2000  . NOSE SURGERY  1990    Allergies  Allergen Reactions  . Tramadol Hcl     REACTION: jittery    Current Outpatient Prescriptions  Medication Sig Dispense Refill  . alendronate (FOSAMAX) 70 MG tablet Take 1 tablet (70 mg total) by mouth once a week. Take with a full glass of water on an empty stomach. 12 tablet 3  . ALPRAZolam (XANAX) 0.5 MG tablet TAKE ONE TABLET BY MOUTH ONCE DAILY AS NEEDED 90 tablet 3  . aspirin 81 MG tablet Take 81 mg by mouth daily.      . Calcium Carbonate (CALTRATE 600) 1500 MG TABS Take by mouth.      . cholecalciferol (VITAMIN D) 1000 UNITS tablet Take 1,000 Units by mouth  daily.     . clobetasol cream (TEMOVATE) 0.05 % Apply twice a week first week then once weekly as needed 30 g 2  . fluticasone (CUTIVATE) 0.05 % cream Apply 1 application topically 2 (two) times daily.     . fluticasone (FLONASE) 50 MCG/ACT nasal spray Place 2 sprays into both nostrils daily. 48 g 3  . levothyroxine (SYNTHROID, LEVOTHROID) 75 MCG tablet Take 1 tablet (75 mcg total) by mouth daily. 90 tablet 3  . metoprolol succinate (TOPROL-XL) 25 MG 24 hr tablet Take 1 tablet (25 mg total) by mouth daily. KEEP OV. 90 tablet 0  . Multiple Vitamins-Minerals (MULTIVITAMIN WITH MINERALS) tablet Take 1 tablet by mouth daily.      . Omega-3 Fatty Acids (FISH OIL) 1000  MG CAPS Take 2 capsules by mouth 2 (two) times daily.    . simvastatin (ZOCOR) 20 MG tablet Take 1 tablet (20 mg total) by mouth at bedtime. 90 tablet 0  . tretinoin (RETIN-A) 0.025 % cream Apply topically at bedtime.     . vitamin C (ASCORBIC ACID) 500 MG tablet Take 500 mg by mouth daily.      . hydrochlorothiazide (HYDRODIURIL) 12.5 MG tablet Take 1 tablet (12.5 mg total) by mouth every other day. 30 tablet 6   No current facility-administered medications for this visit.     Socially she is married and has 2 children and 4 grandchildren with one great grandchild. She smokes little cigars. [facility].  ROS General: Negative; No fevers, chills, or night sweats;  HEENT: Negative; No changes in vision or hearing, sinus congestion, difficulty swallowing Pulmonary: Negative; No cough, wheezing, shortness of breath, hemoptysis Cardiovascular: Negative; No chest pain, presyncope, syncope, palpitations; occasional left ankle swelling GI: Negative; No nausea, vomiting, diarrhea, or abdominal pain GU: Negative; No dysuria, hematuria, or difficulty voiding Musculoskeletal: Occasional left leg discomfort no myalgias, joint pain, or weakness Hematologic/Oncology: Negative; no easy bruising, bleeding Endocrine: Positive for hypothyroidism, on Synthroid e; no diabetes Neuro: Negative; no changes in balance, headaches Skin: Negative; No rashes or skin lesions Psychiatric: Negative; No behavioral problems, depression Sleep: Negative; No snoring, daytime sleepiness, hypersomnolence, bruxism, restless legs, hypnogognic hallucinations, no cataplexy Other comprehensive 14 point system review is negative.   PE BP (!) 147/82   Pulse (!) 52   Ht _0  (1.575 m)   Wt 144 lb 12.8 oz (65.7 kg)   BMI 26.48 kg/m   Repeat blood pressure by me was 140/82.  Wt Readings from Last 3 Encounters:  11/17/15 144 lb 12.8 oz (65.7 kg)  10/09/15 145 lb (65.8 kg)    09/10/15 145 lb 6.4 oz (66 kg)   General: Alert, oriented, no distress.  HEENT: Normocephalic, atraumatic. Pupils round and reactive; sclera anicteric; no xanthelasmas Nose without nasal septal hypertrophy Mouth/Parynx benign; Mallinpatti scale 2 Neck: No JVD, no carotid bruits with normal carotid upstroke Chest wall: Nontender to palpation Lungs: clear to ausculatation and percussion; no wheezing or rales Heart: RRR, s1 s2 normal 1/6 SEM, no diastolic murmur. No S3 or S4 gallop. No rubs thrills or heaves. Abdomen: soft, nontender; no hepatosplenomehaly, BS+; abdominal aorta nontender and not dilated by palpation. Back: No CVA tenderness Pulses 2+ Extremities: Trace to 1+ left ankle swelling and trivial right ankle swelling.  no clubbing cyanosis, Homan's sign negative  Neurologic: grossly nonfocal Psychological: Normal affect and mood  ECG (independently read by me): Sinus bradycardia 52 bpm.  QS complex precordially.  November 2016 ECG (independently read by me): Sinus bradycardia 52 bpm.  Normal intervals.  No ST segment changes.  April 2016 ECG (independently read by me): Sinus bradycardia at 54 bpm.  Normal intervals.  No significant ST segment changes  September 2015 ECG (independently read by me): Sinus bradycardia 52 beats per minute.  QS complex in V1 and V2.  Nonspecific ST change.  QTc interval 398 ms  Prior March 2015 ECG (independently read by me): Sinus bradycardia 51 beats per minute. No ectopy. Normal intervals.  Prior 09/14/2012 ECG: Sinus rhythm at 53 beats per minute. She has poor anterior R-wave progression. Normal intervals  LABS:  BMP Latest Ref Rng & Units 11/17/2015 04/25/2014 08/13/2013  Glucose 65 - 99 mg/dL 84 86 75  BUN 7 - 25 mg/dL _0 Creatinine 0.60 - 0.93 mg/dL 0.76 0.66 0.8  Sodium 135 - 146 mmol/L 140 139 141  Potassium 3.5 - 5.3 mmol/L 4.1 3.7 3.9  Chloride 98 - 110 mmol/L 106 104 105  CO2 20 - 31 mmol/L _1 Calcium 8.6 - 10.4 mg/dL  9.3 9.0 9.3    Hepatic Function Latest Ref Rng & Units 11/17/2015 04/25/2014 08/13/2013  Total Protein 6.1 - 8.1 g/dL 6.5 6.5 6.8  Albumin 3.6 - 5.1 g/dL 3.8 3.7 3.9  AST 10 - 35 U/L _2 ALT 6 - 29 U/L _3 Alk Phosphatase 33 - 130 U/L 67 75 59  Total Bilirubin 0.2 - 1.2 mg/dL 0.4 0.3 0.9  Bilirubin, Direct 0.0 - 0.3 mg/dL - - -    CBC Latest Ref Rng & Units 11/17/2015 10/24/2014 04/25/2014  WBC 3.8 - 10.8 K/uL 8.7 9.8 7.9  Hemoglobin 11.7 - 15.5 g/dL 13.4 13.7 13.0  Hematocrit 35.0 - 45.0 % 41.3 41.0 39.1  Platelets 140 - 400 K/uL 222 227.0 243   Lab Results  Component Value Date   MCV 91.2 11/17/2015   MCV 89.9 10/24/2014   MCV 87.5 04/25/2014    Lab Results  Component Value Date   TSH 1.94 11/17/2015   Lab Results  Component Value Date   HGBA1C 6.0 08/06/2012   BNP No results found for: PROBNP  Lipid Panel     Component Value Date/Time   CHOL 152 11/17/2015 1005   TRIG 184 (H) 11/17/2015 1005   HDL 51 11/17/2015 1005   CHOLHDL 3.0 11/17/2015 1005   VLDL 37 (H) 11/17/2015 1005   LDLCALC 64 11/17/2015 1005     RADIOLOGY: No results found.    ASSESSMENT AND PLAN: Haley [name] is a 70 year old female who has a history of palpitations that have improved with once a day long-acting beta blocker therapy.  She has not had any recurrent atrial fibrillation and  is maintaining sinus rhythm.  She is bradycardic and remains asymptomatic.  She specifically denies dizziness or orthostatic symptoms.  A prior echo has revealed both normal systolic and diastolic function with mild aortic valve sclerosis without stenosis.  A nuclear perfusion study  in January 2013 showed normal perfusion without scar or ischemia and she had a post stress ejection fraction of 75%.  She has been maintained on Toprol XL 25 minute milligrams daily, which has controlled her pressure and palpitations.  Her blood pressure was mildly elevated today.  She also has some mild ankle swelling, left  greater than right.  I've suggested the addition of HCTZ 12.5 mg to take at least every other day and possibly daily.  If edema persists.  I will check a complete set of fasting blood work.  Adjustments to her medication will be done if necessary.  She continues to have issues with bladder incontinence and will be following up with her OB/GYN physician.  She will also be following up with her primary care physician.  I will see her in one year for cardiology reevaluation.  Time spent: 25 minutes  Troy Sine, MD, [facility]  11/18/2015 6:12 PM

## 2015-11-20 ENCOUNTER — Other Ambulatory Visit: Payer: Self-pay | Admitting: *Deleted

## 2015-11-20 ENCOUNTER — Telehealth: Payer: Self-pay | Admitting: *Deleted

## 2015-11-20 MED ORDER — HYDROCHLOROTHIAZIDE 12.5 MG PO TABS: 12.5000 mg | ORAL_TABLET | ORAL | 1 refills | Status: DC

## 2015-11-20 NOTE — Telephone Encounter (Signed)
Mateo Flow from [facility] left a msg on the refill vm requesting a call back at [phone] in regards to the hctz rx. She states that they can only refill for ninety day supplies so they are requesting a quantity change.

## 2015-11-23 ENCOUNTER — Other Ambulatory Visit: Payer: Self-pay | Admitting: Cardiovascular Disease

## 2015-11-23 ENCOUNTER — Other Ambulatory Visit: Payer: PPO

## 2015-11-23 NOTE — Telephone Encounter (Signed)
LMTCB-sarah's VM

## 2015-11-23 NOTE — Telephone Encounter (Signed)
New message     Pt c/o medication issue:  1. Name of Medication: HCTZ 2. How are you currently taking this medication (dosage and times per day)? 90 tablets, 1 tab every other day  3. Are you having a reaction (difficulty breathing--STAT)? no  4. What is your medication issue? Pharmacy has a question regarding directions.  Please call

## 2015-11-25 MED ORDER — HYDROCHLOROTHIAZIDE 12.5 MG PO TABS: 12.5000 mg | ORAL_TABLET | ORAL | 3 refills | Status: DC

## 2015-11-25 NOTE — Telephone Encounter (Signed)
Unable to get a hold of [facility]-sent new rx to see if they needed since rx sent was #90 and pt only need #45 for 90 days

## 2015-11-25 NOTE — Telephone Encounter (Signed)
LMTCB per last visit HCTZ Take 1 tablet (12.5 mg total) by mouth every other day.

## 2015-11-26 ENCOUNTER — Telehealth: Payer: Self-pay | Admitting: *Deleted

## 2015-11-26 NOTE — Telephone Encounter (Signed)
Patient notified of results.

## 2015-11-26 NOTE — Telephone Encounter (Signed)
-----   Message from Troy Sine, MD sent at 11/22/2015  3:51 PM EST ----- Labs good x TG increased, but better

## 2015-11-30 ENCOUNTER — Ambulatory Visit (INDEPENDENT_AMBULATORY_CARE_PROVIDER_SITE_OTHER): Payer: PPO | Admitting: Gynecology

## 2015-11-30 ENCOUNTER — Encounter: Payer: PPO | Admitting: Internal Medicine

## 2015-11-30 ENCOUNTER — Encounter: Payer: Self-pay | Admitting: Gynecology

## 2015-11-30 VITALS — BP 138/86 | Ht 61.5 in | Wt 141.0 lb

## 2015-11-30 DIAGNOSIS — Z01411 Encounter for gynecological examination (general) (routine) with abnormal findings: Secondary | ICD-10-CM | POA: Diagnosis not present

## 2015-11-30 DIAGNOSIS — F17200 Nicotine dependence, unspecified, uncomplicated: Secondary | ICD-10-CM

## 2015-11-30 DIAGNOSIS — Z780 Asymptomatic menopausal state: Secondary | ICD-10-CM

## 2015-11-30 DIAGNOSIS — M816 Localized osteoporosis [Lequesne]: Secondary | ICD-10-CM

## 2015-11-30 NOTE — Patient Instructions (Signed)
Osteoporosis Osteoporosis is the thinning and loss of density in the bones. Osteoporosis makes the bones more brittle, fragile, and likely to break (fracture). Over time, osteoporosis can cause the bones to become so weak that they fracture after a simple fall. The bones most likely to fracture are the bones in the hip, wrist, and spine. What are the causes? The exact cause is not known. What increases the risk? Anyone can develop osteoporosis. You may be at greater risk if you have a family history of the condition or have poor nutrition. You may also have a higher risk if you are:  Female.  80 years old or older.  A smoker.  Not physically active.  White or Asian.  Slender. What are the signs or symptoms? A fracture might be the first sign of the disease, especially if it results from a fall or injury that would not usually cause a bone to break. Other signs and symptoms include:  Low back and neck pain.  Stooped posture.  Height loss. How is this diagnosed? To make a diagnosis, your health care provider may:  Take a medical history.  Perform a physical exam.  Order tests, such as:  A bone mineral density test.  A dual-energy X-ray absorptiometry test. How is this treated? The goal of osteoporosis treatment is to strengthen your bones to reduce your risk of a fracture. Treatment may involve:  Making lifestyle changes, such as:  Eating a diet rich in calcium.  Doing weight-bearing and muscle-strengthening exercises.  Stopping tobacco use.  Limiting alcohol intake.  Taking medicine to slow the process of bone loss or to increase bone density.  Monitoring your levels of calcium and vitamin D. Follow these instructions at home:  Include calcium and vitamin D in your diet. Calcium is important for bone health, and vitamin D helps the body absorb calcium.  Perform weight-bearing and muscle-strengthening exercises as directed by your health care provider.  Do  not use any tobacco products, including cigarettes, chewing tobacco, and electronic cigarettes. If you need help quitting, ask your health care provider.  Limit your alcohol intake.  Take medicines only as directed by your health care provider.  Keep all follow-up visits as directed by your health care provider. This is important.  Take precautions at home to lower your risk of falling, such as:  Keeping rooms well lit and clutter free.  Installing safety rails on stairs.  Using rubber mats in the bathroom and other areas that are often wet or slippery. Get help right away if: You fall or injure yourself. This information is not intended to replace advice given to you by your health care provider. Make sure you discuss any questions you have with your health care provider. Document Released: 10/06/2004 Document Revised: 06/01/2015 Document Reviewed: 06/06/2013 Elsevier Interactive Patient Education  2017 [employer]. Steps to Quit Smoking Smoking tobacco can be harmful to your health and can affect almost every organ in your body. Smoking puts you, and those around you, at risk for developing many serious chronic diseases. Quitting smoking is difficult, but it is one of the best things that you can do for your health. It is never too late to quit. What are the benefits of quitting smoking? When you quit smoking, you lower your risk of developing serious diseases and conditions, such as:  Lung cancer or lung disease, such as COPD.  Heart disease.  Stroke.  Heart attack.  Infertility.  Osteoporosis and bone fractures. Additionally, symptoms such as  coughing, wheezing, and shortness of breath may get better when you quit. You may also find that you get sick less often because your body is stronger at fighting off colds and infections. If you are pregnant, quitting smoking can help to reduce your chances of having a baby of low birth weight. How do I get ready to quit? When you  decide to quit smoking, create a plan to make sure that you are successful. Before you quit:  Pick a date to quit. Set a date within the next two weeks to give you time to prepare.  Write down the reasons why you are quitting. Keep this list in places where you will see it often, such as on your bathroom mirror or in your car or wallet.  Identify the people, places, things, and activities that make you want to smoke (triggers) and avoid them. Make sure to take these actions:  Throw away all cigarettes at home, at work, and in your car.  Throw away smoking accessories, such as Scientist, research (medical).  Clean your car and make sure to empty the ashtray.  Clean your home, including curtains and carpets.  Tell your family, friends, and coworkers that you are quitting. Support from your loved ones can make quitting easier.  Talk with your health care provider about your options for quitting smoking.  Find out what treatment options are covered by your health insurance. What strategies can I use to quit smoking? Talk with your healthcare provider about different strategies to quit smoking. Some strategies include:  Quitting smoking altogether instead of gradually lessening how much you smoke over a period of time. Research shows that quitting "cold [place]" is more successful than gradually quitting.  Attending in-person counseling to help you build problem-solving skills. You are more likely to have success in quitting if you attend several counseling sessions. Even short sessions of 10 minutes can be effective.  Finding resources and support systems that can help you to quit smoking and remain smoke-free after you quit. These resources are most helpful when you use them often. They can include:  Online chats with a Social worker.  Telephone quitlines.  Printed Furniture conservator/restorer.  Support groups or group counseling.  Text messaging programs.  Mobile phone applications.  Taking  medicines to help you quit smoking. (If you are pregnant or breastfeeding, talk with your health care provider first.) Some medicines contain nicotine and some do not. Both types of medicines help with cravings, but the medicines that include nicotine help to relieve withdrawal symptoms. Your health care provider may recommend:  Nicotine patches, gum, or lozenges.  Nicotine inhalers or sprays.  Non-nicotine medicine that is taken by mouth. Talk with your health care provider about combining strategies, such as taking medicines while you are also receiving in-person counseling. Using these two strategies together makes you more likely to succeed in quitting than if you used either strategy on its own. If you are pregnant or breastfeeding, talk with your health care provider about finding counseling or other support strategies to quit smoking. Do not take medicine to help you quit smoking unless told to do so by your health care provider. What things can I do to make it easier to quit? Quitting smoking might feel overwhelming at first, but there is a lot that you can do to make it easier. Take these important actions:  Reach out to your family and friends and ask that they support and encourage you during this time. Call telephone quitlines,  reach out to support groups, or work with a Social worker for support.  Ask people who smoke to avoid smoking around you.  Avoid places that trigger you to smoke, such as bars, parties, or smoke-break areas at work.  Spend time around people who do not smoke.  Lessen stress in your life, because stress can be a smoking trigger for some people. To lessen stress, try:  Exercising regularly.  Deep-breathing exercises.  Yoga.  Meditating.  Performing a body scan. This involves closing your eyes, scanning your body from head to toe, and noticing which parts of your body are particularly tense. Purposefully relax the muscles in those areas.  Download or  purchase mobile phone or tablet apps (applications) that can help you stick to your quit plan by providing reminders, tips, and encouragement. There are many free apps, such as QuitGuide from the [employer] Office manager for Disease Control and Prevention). You can find other support for quitting smoking (smoking cessation) through smokefree.gov and other websites. How will I feel when I quit smoking? Within the first 24 hours of quitting smoking, you may start to feel some withdrawal symptoms. These symptoms are usually most noticeable 2-3 days after quitting, but they usually do not last beyond 2-3 weeks. Changes or symptoms that you might experience include:  Mood swings.  Restlessness, anxiety, or irritation.  Difficulty concentrating.  Dizziness.  Strong cravings for sugary foods in addition to nicotine.  Mild weight gain.  Constipation.  Nausea.  Coughing or a sore throat.  Changes in how your medicines work in your body.  A depressed mood.  Difficulty sleeping (insomnia). After the first 2-3 weeks of quitting, you may start to notice more positive results, such as:  Improved sense of smell and taste.  Decreased coughing and sore throat.  Slower heart rate.  Lower blood pressure.  Clearer skin.  The ability to breathe more easily.  Fewer sick days. Quitting smoking is very challenging for most people. Do not get discouraged if you are not successful the first time. Some people need to make many attempts to quit before they achieve long-term success. Do your best to stick to your quit plan, and talk with your health care provider if you have any questions or concerns. This information is not intended to replace advice given to you by your health care provider. Make sure you discuss any questions you have with your health care provider. Document Released: 12/21/2000 Document Revised: 08/25/2015 Document Reviewed: 05/13/2014 Elsevier Interactive Patient Education  2017 [employer].

## 2015-11-30 NOTE — Progress Notes (Signed)
[place] 06/06/1945 [id number]   History:    70 y.o.  for annual gyn exam with no complaints today. Patient continues to smoke approximately half a pack of cigarette per day despite on numerous occasion that she has been counseled. Patient with history of osteoporosis whereby her PCP had started her several years ago on Fosamax 70 mg weekly which was initiated in 2010.Bone density study done 12/24/2013 demonstrated that the lowest T score was -2.5 at the right femoral neck with decrease in bone mineralization of -2.3%. This study was compared at the same facility with study from 2013 and radiologist had documented that there was no statistically significant change.  Her PCP has been doing her blood work. Patient with past history vitamin D deficiency patient had a total abdominal hysterectomy and appendectomy secondary to leiomyomatous uteri dysmenorrhea and menorrhagia several years ago. Patient declined flu vaccine. All vaccines are up-to-date otherwise. Normal colonoscopy in 2014. PCP has been doing her blood work.  Past medical history,surgical history, family history and social history were all reviewed and documented in the EPIC chart.  Gynecologic History No LMP recorded. Patient has had a hysterectomy. Contraception: status post hysterectomy Last Pap: Several years ago. Results were: normal Last mammogram: 2016. Results were: normal  Obstetric History OB History  Gravida Para Term Preterm AB Living  3 3       3   SAB TAB Ectopic Multiple Live Births               # Outcome Date GA Lbr Len/2nd Weight Sex Delivery Anes PTL Lv  3 Para           2 Para           1 Para                ROS: A ROS was performed and pertinent positives and negatives are included in the history.  GENERAL: No fevers or chills. HEENT: No change in vision, no earache, sore throat or sinus congestion. NECK: No pain or stiffness. CARDIOVASCULAR: No chest pain or pressure. No palpitations. PULMONARY:  No shortness of breath, cough or wheeze. GASTROINTESTINAL: No abdominal pain, nausea, vomiting or diarrhea, melena or bright red blood per rectum. GENITOURINARY: No urinary frequency, urgency, hesitancy or dysuria. MUSCULOSKELETAL: No joint or muscle pain, no back pain, no recent trauma. DERMATOLOGIC: No rash, no itching, no lesions. ENDOCRINE: No polyuria, polydipsia, no heat or cold intolerance. No recent change in weight. HEMATOLOGICAL: No anemia or easy bruising or bleeding. NEUROLOGIC: No headache, seizures, numbness, tingling or weakness. PSYCHIATRIC: No depression, no loss of interest in normal activity or change in sleep pattern.     Exam: chaperone present  BP 138/86   Ht 5' 1.5" (1.562 m)   Wt 141 lb (64 kg)   BMI 26.21 kg/m   Body mass index is 26.21 kg/m.  General appearance : Well developed well nourished female. No acute distress HEENT: Eyes: no retinal hemorrhage or exudates,  Neck supple, trachea midline, no carotid bruits, no thyroidmegaly Lungs: Clear to auscultation, no rhonchi or wheezes, or rib retractions  Heart: Regular rate and rhythm, no murmurs or gallops Breast:Examined in sitting and supine position were symmetrical in appearance, no palpable masses or tenderness,  no skin retraction, no nipple inversion, no nipple discharge, no skin discoloration, no axillary or supraclavicular lymphadenopathy Abdomen: no palpable masses or tenderness, no rebound or guarding Extremities: no edema or skin discoloration or tenderness  Pelvic:  Bartholin, Urethra,  Skene Glands: Within normal limits             Vagina: No gross lesions or discharge  Cervix: No gross lesions or discharge  Uterus absent  Adnexa  absent  Anus and perineum  normal   Rectovaginal  normal sphincter tone without palpated masses or tenderness             Hemoccult PCP provides     Assessment/Plan:  70 y.o. female for annual exam will be referred for her bone density study as well as her mammogram.  We will review the bone density study. If her bone density study indicates that it is stable we will try to give the patient a drug holiday since she has been on the Fosamax now for 7 years. She was encouraged to take her calcium vitamin D and continue weightbearing exercises. We counseled her once again on the detrimental effects of smoking. Pap smear no longer indicated.   Terrance Mass MD, 9:53 AM 11/30/2015

## 2015-12-02 ENCOUNTER — Encounter: Payer: Self-pay | Admitting: Internal Medicine

## 2015-12-02 ENCOUNTER — Ambulatory Visit (INDEPENDENT_AMBULATORY_CARE_PROVIDER_SITE_OTHER): Payer: PPO | Admitting: Internal Medicine

## 2015-12-02 VITALS — BP 120/62 | HR 48 | Temp 98.0°F | Ht 62.0 in | Wt 141.0 lb

## 2015-12-02 DIAGNOSIS — Z0000 Encounter for general adult medical examination without abnormal findings: Secondary | ICD-10-CM | POA: Diagnosis not present

## 2015-12-02 DIAGNOSIS — E038 Other specified hypothyroidism: Secondary | ICD-10-CM

## 2015-12-02 DIAGNOSIS — E785 Hyperlipidemia, unspecified: Secondary | ICD-10-CM | POA: Diagnosis not present

## 2015-12-02 DIAGNOSIS — I480 Paroxysmal atrial fibrillation: Secondary | ICD-10-CM | POA: Diagnosis not present

## 2015-12-02 DIAGNOSIS — F17200 Nicotine dependence, unspecified, uncomplicated: Secondary | ICD-10-CM

## 2015-12-02 NOTE — Progress Notes (Signed)
Pre visit review using our clinic review tool, if applicable. No additional management support is needed unless otherwise documented below in the visit note. 

## 2015-12-02 NOTE — Progress Notes (Signed)
Subjective:    Patient ID: Haley [name], female    DOB: September 05, 1945, 70 y.o.   MRN: [id number]  HPI  70 year old patient who is seen today for a preventive health examination.  She is followed by multiple consultants including cardiology and OB/GYN.  She has had recent evaluations.  No concerns or complaints.  Medical problems include essential hypertension, dyslipidemia.  She has a history of hypothyroidism and paroxysmal atrial fibrillation. Her clinical status has been stable  Past Medical History:  Diagnosis Date  . Allergy    SEASONAL  . Cataract    BILATERAL  . Fibromyalgia   . GERD (gastroesophageal reflux disease)   . H/O echocardiogram 04/28/08   EF>55% trace mitral regurgitation, No significant valvular pathology  . Hemorrhoids    internal  . History of stress test 02/08/2011   Normal Myocardial perfusion study, this is a low risk scan, No prior study available for comparison  . Hyperlipidemia   . MVP (mitral valve prolapse)    mild and MR  . OA (osteoarthritis)   . Osteoporosis   . Paroxysmal a-fib ([place])   . Thyroid disease   . Varicosities of leg   . Vitamin D deficiency      Social History   Social History  . Marital status: Married    Spouse name: N/A  . Number of children: N/A  . Years of education: N/A   Occupational History  . Not on file.   Social History Main Topics  . Smoking status: Current Some Day Smoker    Types: Cigars  . Smokeless tobacco: Never Used     Comment: peach flavor   . Alcohol use No  . Drug use: No  . Sexual activity: Not on file   Other Topics Concern  . Not on file   Social History Narrative  . No narrative on file    Past Surgical History:  Procedure Laterality Date  . ABDOMINAL HYSTERECTOMY  1989  . APPENDECTOMY  1989   with hysterectomy  . COLONOSCOPY  11-20-2000  . NOSE SURGERY  1990    Family History  Problem Relation Age of Onset  . Diabetes Daughter   . Diabetes Mother   . Hypertension Mother     . Heart disease Mother   . Stroke Maternal Grandmother   . Colon cancer Neg Hx     Allergies  Allergen Reactions  . Tramadol Hcl     REACTION: jittery    Current Outpatient Prescriptions on File Prior to Visit  Medication Sig Dispense Refill  . alendronate (FOSAMAX) 70 MG tablet Take 1 tablet (70 mg total) by mouth once a week. Take with a full glass of water on an empty stomach. 12 tablet 3  . ALPRAZolam (XANAX) 0.5 MG tablet TAKE ONE TABLET BY MOUTH ONCE DAILY AS NEEDED 90 tablet 3  . aspirin 81 MG tablet Take 81 mg by mouth daily.      . Calcium Carbonate (CALTRATE 600) 1500 MG TABS Take by mouth.      . cholecalciferol (VITAMIN D) 1000 UNITS tablet Take 1,000 Units by mouth daily.     . clobetasol cream (TEMOVATE) 0.05 % Apply twice a week first week then once weekly as needed 30 g 2  . fluticasone (CUTIVATE) 0.05 % cream Apply 1 application topically 2 (two) times daily.     . fluticasone (FLONASE) 50 MCG/ACT nasal spray Place 2 sprays into both nostrils daily. 48 g 3  . levothyroxine (SYNTHROID, LEVOTHROID)  75 MCG tablet Take 1 tablet (75 mcg total) by mouth daily. 90 tablet 3  . metoprolol succinate (TOPROL-XL) 25 MG 24 hr tablet Take 1 tablet (25 mg total) by mouth daily. KEEP OV. 90 tablet 0  . Multiple Vitamins-Minerals (MULTIVITAMIN WITH MINERALS) tablet Take 1 tablet by mouth daily.      . Omega-3 Fatty Acids (FISH OIL) 1000 MG CAPS Take 2 capsules by mouth 2 (two) times daily.    . simvastatin (ZOCOR) 20 MG tablet Take 1 tablet (20 mg total) by mouth at bedtime. 90 tablet 0  . tretinoin (RETIN-A) 0.025 % cream Apply topically at bedtime.     . vitamin C (ASCORBIC ACID) 500 MG tablet Take 500 mg by mouth daily.      . hydrochlorothiazide (HYDRODIURIL) 12.5 MG tablet Take 1 tablet (12.5 mg total) by mouth every other day. (Patient not taking: Reported on 12/02/2015) 45 tablet 3   No current facility-administered medications on file prior to visit.     BP 120/62 (BP  Location: Left Arm, Patient Position: Sitting, Cuff Size: Normal)   Pulse (!) 48   Temp 98 F (36.7 C) (Oral)   Ht 5\' 2"  (1.575 m)   Wt 141 lb (64 kg)   SpO2 98%   BMI 25.79 kg/m   Medicare wellness  1. Risk factors, based on past  M,S,F history.  Current vascular risk factors include hypertension and dyslipidemia.  Patient has a history of paroxysmal atrial fibrillation  2.  Physical activities:remains quite active with activities such as yard work.  No exercise limitations  3.  Depression/mood:no history of depression or mood disorder  4.  Hearing:no deficits  5.  ADL's:independent  6.  Fall risk:low  7.  Home safety:no problems identified  8.  Height weight, and visual acuity;height and weight stable.  Does have an annual eye examination  9.  Counseling: continue active lifestyle.  Heart healthy diet.  Patient is at ideal body weight  10. Lab orders based on risk factors:laboratory studies reviewed, including lipid profile  11. Referral :not appropriate at this time  12. Care plan:continue efforts at aggressive risk factor modification  13. Cognitive assessment: alert and oriented with normal affect.  No cognitive dysfunction  14. Screening: Patient provided with a written and personalized 5-10 year screening schedule in the AVS.    15. Provider List Update: cardiology OB/GYN.  Dermatology cardiology, primary care medicine and dental medicine as well as ophthalmology   Review of Systems  Constitutional: Negative.   HENT: Negative for congestion, dental problem, hearing loss, rhinorrhea, sinus pressure, sore throat and tinnitus.   Eyes: Negative for pain, discharge and visual disturbance.  Respiratory: Negative for cough and shortness of breath.   Cardiovascular: Negative for chest pain, palpitations and leg swelling.  Gastrointestinal: Negative for abdominal distention, abdominal pain, blood in stool, constipation, diarrhea, nausea and vomiting.  Genitourinary:  Negative for difficulty urinating, dysuria, flank pain, frequency, hematuria, pelvic pain, urgency, vaginal bleeding, vaginal discharge and vaginal pain.  Musculoskeletal: Negative for arthralgias, gait problem and joint swelling.  Skin: Negative for rash.  Neurological: Negative for dizziness, syncope, speech difficulty, weakness, numbness and headaches.  Hematological: Negative for adenopathy.  Psychiatric/Behavioral: Negative for agitation, behavioral problems and dysphoric mood. The patient is not nervous/anxious.        Objective:   Physical Exam  Constitutional: She is oriented to person, place, and time. She appears well-developed and well-nourished.  HENT:  Head: Normocephalic and atraumatic.  Right Ear: External  ear normal.  Left Ear: External ear normal.  Mouth/Throat: Oropharynx is clear and moist.  Eyes: Conjunctivae and EOM are normal.  Neck: Normal range of motion. Neck supple. No JVD present. No thyromegaly present.  Cardiovascular: Normal rate, regular rhythm and normal heart sounds.   No murmur heard. Decreased left dorsalis pedis pulse  Pulmonary/Chest: Effort normal and breath sounds normal. She has no wheezes. She has no rales.  Abdominal: Soft. Bowel sounds are normal. She exhibits no distension and no mass. There is no tenderness. There is no rebound and no guarding.  Musculoskeletal: Normal range of motion. She exhibits no edema or tenderness.  Neurological: She is alert and oriented to person, place, and time. She has normal reflexes. No cranial nerve deficit. She exhibits normal muscle tone. Coordination normal.  Skin: Skin is warm and dry. No rash noted.  Psychiatric: She has a normal mood and affect. Her behavior is normal.          Assessment & Plan:   Preventive health examination.  Prevnar 13, dispensed Osteoporosis.  Continue Fosamax, calcium and vitamin D supplementation  Essential hypertension, stable  Hypothyroidism.  Continue  levothyroxine  Follow-up one year or as needed  [place]  Dyslipidemia.  Continue simvastatin

## 2015-12-02 NOTE — Patient Instructions (Signed)
Limit your sodium (Salt) intake    It is important that you exercise regularly, at least 20 minutes 3 to 4 times per week.  If you develop chest pain or shortness of breath seek  medical attention.  Take a calcium supplement, plus 800-1200 units of vitamin D  Return in one year for follow-up  

## 2015-12-21 ENCOUNTER — Ambulatory Visit
Admission: RE | Admit: 2015-12-21 | Discharge: 2015-12-21 | Disposition: A | Discharge status: Home / Self Care | Payer: PPO | Source: Ambulatory Visit | Attending: Internal Medicine | Admitting: Internal Medicine

## 2015-12-21 DIAGNOSIS — Z1231 Encounter for screening mammogram for malignant neoplasm of breast: Secondary | ICD-10-CM

## 2016-01-21 ENCOUNTER — Other Ambulatory Visit: Payer: Self-pay

## 2016-01-21 MED ORDER — METOPROLOL SUCCINATE ER 25 MG PO TB24: 25.0000 mg | ORAL_TABLET | Freq: Every day | ORAL | 3 refills | Status: DC

## 2016-01-21 MED ORDER — SIMVASTATIN 20 MG PO TABS: 20.0000 mg | ORAL_TABLET | Freq: Every day | ORAL | 3 refills | Status: DC

## 2016-03-24 ENCOUNTER — Other Ambulatory Visit: Payer: Self-pay | Admitting: Gynecology

## 2016-03-30 ENCOUNTER — Telehealth: Payer: Self-pay | Admitting: Internal Medicine

## 2016-03-30 NOTE — Telephone Encounter (Signed)
Pt needs refill on alprazolam 0.5 mg #90 W/REFILLS [facility]

## 2016-03-31 MED ORDER — ALPRAZOLAM 0.5 MG PO TABS: ORAL_TABLET | ORAL | 3 refills | Status: DC

## 2016-03-31 NOTE — Telephone Encounter (Signed)
Rx refill was faxed over to pharmacy

## 2016-04-12 ENCOUNTER — Ambulatory Visit: Payer: PPO | Admitting: Family Medicine

## 2016-04-12 ENCOUNTER — Ambulatory Visit (INDEPENDENT_AMBULATORY_CARE_PROVIDER_SITE_OTHER): Payer: PPO | Admitting: Internal Medicine

## 2016-04-12 ENCOUNTER — Encounter: Payer: Self-pay | Admitting: Internal Medicine

## 2016-04-12 ENCOUNTER — Other Ambulatory Visit: Payer: Self-pay

## 2016-04-12 VITALS — BP 132/84 | HR 84 | Temp 98.2°F | Ht 62.0 in | Wt 140.6 lb

## 2016-04-12 DIAGNOSIS — R3915 Urgency of urination: Secondary | ICD-10-CM | POA: Diagnosis not present

## 2016-04-12 DIAGNOSIS — N3941 Urge incontinence: Secondary | ICD-10-CM

## 2016-04-12 DIAGNOSIS — N3281 Overactive bladder: Secondary | ICD-10-CM

## 2016-04-12 LAB — POC URINALSYSI DIPSTICK (AUTOMATED)
Bilirubin, UA: NEGATIVE
Blood, UA: NEGATIVE
Glucose, UA: NEGATIVE
KETONES UA: NEGATIVE
LEUKOCYTES UA: NEGATIVE
Nitrite, UA: NEGATIVE
PH UA: 6 (ref 5.0–8.0)
PROTEIN UA: NEGATIVE
Spec Grav, UA: 1.005 (ref 1.030–1.035)
UROBILINOGEN UA: 0.2 (ref ?–2.0)

## 2016-04-12 MED ORDER — MIRABEGRON ER 50 MG PO TB24: 50.0000 mg | ORAL_TABLET | Freq: Every day | ORAL | 4 refills | Status: DC

## 2016-04-12 NOTE — Patient Instructions (Signed)

## 2016-04-12 NOTE — Progress Notes (Signed)
Subjective:    Patient ID: Haley [name], female    DOB: 03/22/1945, 71 y.o.   MRN: [id number]  HPI  71 year old patient who presentsef complaint of increasing urinary frequency, urgency and occasional incontinence.  She states this has been present for months but has intensified over the past 2 months.  She has had UTIs in the past.  She denies any dysuria Her problem list includes a history of urinary incontinence.  Denies any fever.  Past Medical History:  Diagnosis Date  . Allergy    SEASONAL  . Cataract    BILATERAL  . Fibromyalgia   . GERD (gastroesophageal reflux disease)   . H/O echocardiogram 04/28/08   EF>55% trace mitral regurgitation, No significant valvular pathology  . Hemorrhoids    internal  . History of stress test 02/08/2011   Normal Myocardial perfusion study, this is a low risk scan, No prior study available for comparison  . Hyperlipidemia   . MVP (mitral valve prolapse)    mild and MR  . OA (osteoarthritis)   . Osteoporosis   . Paroxysmal a-fib ([place])   . Thyroid disease   . Varicosities of leg   . Vitamin D deficiency      Social History   Social History  . Marital status: Married    Spouse name: N/A  . Number of children: N/A  . Years of education: N/A   Occupational History  . Not on file.   Social History Main Topics  . Smoking status: Current Some Day Smoker    Types: Cigars  . Smokeless tobacco: Never Used     Comment: peach flavor   . Alcohol use No  . Drug use: No  . Sexual activity: Not on file   Other Topics Concern  . Not on file   Social History Narrative  . No narrative on file    Past Surgical History:  Procedure Laterality Date  . ABDOMINAL HYSTERECTOMY  1989  . APPENDECTOMY  1989   with hysterectomy  . COLONOSCOPY  11-20-2000  . NOSE SURGERY  1990    Family History  Problem Relation Age of Onset  . Diabetes Daughter   . Diabetes Mother   . Hypertension Mother   . Heart disease Mother   . Stroke Maternal  Grandmother   . Colon cancer Neg Hx     Allergies  Allergen Reactions  . Tramadol Hcl     REACTION: jittery    Current Outpatient Prescriptions on File Prior to Visit  Medication Sig Dispense Refill  . alendronate (FOSAMAX) 70 MG tablet Take 1 tablet (70 mg total) by mouth once a week. Take with a full glass of water on an empty stomach. 12 tablet 3  . ALPRAZolam (XANAX) 0.5 MG tablet TAKE ONE TABLET BY MOUTH ONCE DAILY AS NEEDED 90 tablet 3  . aspirin 81 MG tablet Take 81 mg by mouth daily.      . Calcium Carbonate (CALTRATE 600) 1500 MG TABS Take by mouth.      . cholecalciferol (VITAMIN D) 1000 UNITS tablet Take 1,000 Units by mouth daily.     . clobetasol cream (TEMOVATE) 0.05 % Apply twice a week first week then once weekly as needed 30 g 2  . fluticasone (CUTIVATE) 0.05 % cream Apply 1 application topically 2 (two) times daily.     . fluticasone (FLONASE) 50 MCG/ACT nasal spray Place 2 sprays into both nostrils daily. 48 g 3  . hydrochlorothiazide (HYDRODIURIL) 12.5 MG tablet  Take 1 tablet (12.5 mg total) by mouth every other day. (Patient not taking: Reported on 12/02/2015) 45 tablet 3  . levothyroxine (SYNTHROID, LEVOTHROID) 75 MCG tablet Take 1 tablet (75 mcg total) by mouth daily. 90 tablet 3  . metoprolol succinate (TOPROL-XL) 25 MG 24 hr tablet Take 1 tablet (25 mg total) by mouth daily. 90 tablet 3  . Multiple Vitamins-Minerals (MULTIVITAMIN WITH MINERALS) tablet Take 1 tablet by mouth daily.      . Omega-3 Fatty Acids (FISH OIL) 1000 MG CAPS Take 2 capsules by mouth 2 (two) times daily.    . simvastatin (ZOCOR) 20 MG tablet Take 1 tablet (20 mg total) by mouth at bedtime. 90 tablet 3  . tretinoin (RETIN-A) 0.025 % cream Apply topically at bedtime.     . vitamin C (ASCORBIC ACID) 500 MG tablet Take 500 mg by mouth daily.       No current facility-administered medications on file prior to visit.     BP 132/84 (BP Location: Left Arm, Patient Position: Sitting, Cuff Size:  Normal)   Pulse 84   Temp 98.2 F (36.8 C) (Oral)   Ht 5\' 2"  (1.575 m)   Wt 140 lb 9.6 oz (63.8 kg)   SpO2 98%   BMI 25.72 kg/m     Review of Systems  Constitutional: Negative.   HENT: Negative for congestion, dental problem, hearing loss, rhinorrhea, sinus pressure, sore throat and tinnitus.   Eyes: Negative for pain, discharge and visual disturbance.  Respiratory: Negative for cough and shortness of breath.   Cardiovascular: Negative for chest pain, palpitations and leg swelling.  Gastrointestinal: Negative for abdominal distention, abdominal pain, blood in stool, constipation, diarrhea, nausea and vomiting.  Genitourinary: Positive for difficulty urinating, frequency and urgency. Negative for dysuria, flank pain, hematuria, pelvic pain, vaginal bleeding, vaginal discharge and vaginal pain.  Musculoskeletal: Negative for arthralgias, gait problem and joint swelling.  Skin: Negative for rash.  Neurological: Negative for dizziness, syncope, speech difficulty, weakness, numbness and headaches.  Hematological: Negative for adenopathy.  Psychiatric/Behavioral: Negative for agitation, behavioral problems and dysphoric mood. The patient is not nervous/anxious.        Objective:   Physical Exam  Constitutional: She is oriented to person, place, and time. She appears well-developed and well-nourished.  HENT:  Head: Normocephalic.  Right Ear: External ear normal.  Left Ear: External ear normal.  Mouth/Throat: Oropharynx is clear and moist.  Eyes: Conjunctivae and EOM are normal. Pupils are equal, round, and reactive to light.  Neck: Normal range of motion. Neck supple. No thyromegaly present.  Cardiovascular: Normal rate, regular rhythm, normal heart sounds and intact distal pulses.   Pulmonary/Chest: Effort normal and breath sounds normal.  Abdominal: Soft. Bowel sounds are normal. She exhibits no distension and no mass. There is no tenderness. There is no rebound and no guarding.    No suprapubic tenderness  Musculoskeletal: Normal range of motion.  Lymphadenopathy:    She has no cervical adenopathy.  Neurological: She is alert and oriented to person, place, and time.  Skin: Skin is warm and dry. No rash noted.  Psychiatric: She has a normal mood and affect. Her behavior is normal.          Assessment & Plan:   Overactive bladder.  Will give a trial of Myrbetriq Patient information dispensed History of hypothyroidism Essential hypertension, stable  Nyoka Cowden

## 2016-04-12 NOTE — Progress Notes (Signed)
Pre visit review using our clinic review tool, if applicable. No additional management support is needed unless otherwise documented below in the visit note. 

## 2016-04-14 ENCOUNTER — Telehealth: Payer: Self-pay | Admitting: Internal Medicine

## 2016-04-14 MED ORDER — MIRABEGRON ER 50 MG PO TB24: 50.0000 mg | ORAL_TABLET | Freq: Every day | ORAL | 4 refills | Status: DC

## 2016-04-14 NOTE — Telephone Encounter (Signed)
Was sent to [facility] originally.  Pt requesting it go to [facility] in [place].

## 2016-04-14 NOTE — Telephone Encounter (Signed)
° ° ° ° °  Pt request refill of the following:  Will you please send the below med to [facility] in [place]   mirabegron ER (MYRBETRIQ) 50 MG TB24 tablet   Phamacy:

## 2016-05-02 ENCOUNTER — Other Ambulatory Visit: Payer: Self-pay | Admitting: Gynecology

## 2016-05-02 ENCOUNTER — Telehealth: Payer: Self-pay

## 2016-05-02 DIAGNOSIS — M816 Localized osteoporosis [Lequesne]: Secondary | ICD-10-CM

## 2016-05-02 NOTE — Telephone Encounter (Signed)
Patient called stating she needs refill on her alendronate.  I advised patient of Dr. Durenda Guthrie office note from her CE in Nov 2017 where he wanted her to have BD test and noted if it was stable he would have her take drug holiday since on this med for 7 years now.  She was transferred to appt desk to schedule BD test and Rx was refilled one time to carry her until testing completed and Dr. Moshe Salisbury advises her regarding future Rx refills.

## 2016-05-19 ENCOUNTER — Ambulatory Visit (INDEPENDENT_AMBULATORY_CARE_PROVIDER_SITE_OTHER): Payer: PPO

## 2016-05-19 DIAGNOSIS — M816 Localized osteoporosis [Lequesne]: Secondary | ICD-10-CM

## 2016-05-19 DIAGNOSIS — M810 Age-related osteoporosis without current pathological fracture: Secondary | ICD-10-CM

## 2016-05-25 ENCOUNTER — Encounter: Payer: Self-pay | Admitting: Gynecology

## 2016-07-22 ENCOUNTER — Other Ambulatory Visit: Payer: Self-pay | Admitting: Gynecology

## 2016-07-27 ENCOUNTER — Ambulatory Visit (INDEPENDENT_AMBULATORY_CARE_PROVIDER_SITE_OTHER): Payer: PPO | Admitting: Family Medicine

## 2016-07-27 ENCOUNTER — Encounter: Payer: Self-pay | Admitting: Family Medicine

## 2016-07-27 ENCOUNTER — Ambulatory Visit (HOSPITAL_COMMUNITY)
Admission: RE | Admit: 2016-07-27 | Discharge: 2016-07-27 | Disposition: A | Discharge status: Home / Self Care | Payer: PPO | Source: Ambulatory Visit | Attending: Family Medicine | Admitting: Family Medicine

## 2016-07-27 VITALS — BP 150/88 | HR 62 | Temp 98.1°F | Ht 62.0 in | Wt 142.0 lb

## 2016-07-27 DIAGNOSIS — M79604 Pain in right leg: Secondary | ICD-10-CM | POA: Diagnosis not present

## 2016-07-27 NOTE — Progress Notes (Signed)
*  Preliminary Results* Right lower extremity venous duplex completed. Right lower extremity is negative for deep vein thrombosis. There is no evidence of right Baker's cyst.  Preliminary results discussed with Sunday Spillers of Dr. Barbie Banner office.  07/27/2016 1:22 PM  Analiza Cowger, BS, RVT, RDCS, RDMS

## 2016-07-27 NOTE — Progress Notes (Signed)
   Subjective:    Patient ID: Haley [name], female    DOB: 1946-01-09, 71 y.o.   MRN: [id number]  HPI Here for 3 days of pain and swelling in the right leg. She has never had this before, but she was diagnosed with varicose veins in both legs at [facility] a few years ago. No recent trauma. No chest pain or SOB.    Review of Systems  Constitutional: Negative.   Respiratory: Negative.   Cardiovascular: Positive for leg swelling. Negative for chest pain and palpitations.  Neurological: Negative.        Objective:   Physical Exam  Constitutional: She appears well-developed and well-nourished. No distress.  Cardiovascular: Normal rate, regular rhythm, normal heart sounds and intact distal pulses.   Pulmonary/Chest: Effort normal and breath sounds normal. No respiratory distress. She has no wheezes. She has no rales.  Musculoskeletal:  Thre is 1+ edema in the right ankle area, none on the left side. She is tender in the right lateral thigh and in the right calf. No cords are palpated. Bevelyn Buckles is positive on the right.           Assessment & Plan:  Possible DVT in the leg vs phlebitis. We will send her for a venous doppler today.  Alysia Penna, MD

## 2016-07-27 NOTE — Patient Instructions (Signed)
WE NOW OFFER   Haley [name]'s FAST TRACK!!!  SAME DAY Appointments for ACUTE CARE  Such as: Sprains, Injuries, cuts, abrasions, rashes, muscle pain, joint pain, back pain Colds, flu, sore throats, headache, allergies, cough, fever  Ear pain, sinus and eye infections Abdominal pain, nausea, vomiting, diarrhea, upset stomach Animal/insect bites  3 Easy Ways to Schedule: Walk-In Scheduling Call in scheduling Mychart Sign-up: [email or web address]         

## 2016-07-28 ENCOUNTER — Other Ambulatory Visit: Payer: Self-pay | Admitting: Family Medicine

## 2016-07-28 MED ORDER — IBUPROFEN 800 MG PO TABS: 800.0000 mg | ORAL_TABLET | Freq: Four times a day (QID) | ORAL | 0 refills | Status: DC | PRN

## 2016-08-01 NOTE — Telephone Encounter (Signed)
I spoke with pt and she will schedule a follow up with primary doctor.

## 2016-08-17 ENCOUNTER — Telehealth: Payer: Self-pay | Admitting: *Deleted

## 2016-08-17 MED ORDER — ALENDRONATE SODIUM 70 MG PO TABS: ORAL_TABLET | ORAL | 0 refills | Status: DC

## 2016-08-17 NOTE — Telephone Encounter (Signed)
Pt called requesting refill on fosmax 70 mg, annual scheduled in Nov. 2018. Rx sent to mail order until annual exam

## 2016-09-19 ENCOUNTER — Ambulatory Visit (INDEPENDENT_AMBULATORY_CARE_PROVIDER_SITE_OTHER): Payer: PPO | Admitting: Internal Medicine

## 2016-09-19 ENCOUNTER — Encounter: Payer: Self-pay | Admitting: Internal Medicine

## 2016-09-19 VITALS — BP 132/76 | HR 80 | Temp 98.5°F | Ht 62.0 in | Wt 143.6 lb

## 2016-09-19 DIAGNOSIS — M722 Plantar fascial fibromatosis: Secondary | ICD-10-CM

## 2016-09-19 MED ORDER — MELOXICAM 15 MG PO TABS
15.0000 mg | ORAL_TABLET | Freq: Every day | ORAL | 0 refills | Status: DC
Start: 2016-09-19 — End: 2016-12-21

## 2016-09-19 NOTE — Patient Instructions (Addendum)
Plantar Fasciitis Plantar fasciitis is a painful foot condition that affects the heel. It occurs when the band of tissue that connects the toes to the heel bone (plantar fascia) becomes irritated. This can happen after exercising too much or doing other repetitive activities (overuse injury). The pain from plantar fasciitis can range from mild irritation to severe pain that makes it difficult for you to walk or move. The pain is usually worse in the morning or after you have been sitting or lying down for a while. What are the causes? This condition may be caused by:  Standing for long periods of time.  Wearing shoes that do not fit.  Doing high-impact activities, including running, aerobics, and ballet.  Being overweight.  Having an abnormal way of walking (gait).  Having tight calf muscles.  Having high arches in your feet.  Starting a new athletic activity.  What are the signs or symptoms? The main symptom of this condition is heel pain. Other symptoms include:  Pain that gets worse after activity or exercise.  Pain that is worse in the morning or after resting.  Pain that goes away after you walk for a few minutes.  How is this diagnosed? This condition may be diagnosed based on your signs and symptoms. Your health care provider will also do a physical exam to check for:  A tender area on the bottom of your foot.  A high arch in your foot.  Pain when you move your foot.  Difficulty moving your foot.  You may also need to have imaging studies to confirm the diagnosis. These can include:  X-rays.  Ultrasound.  MRI.  How is this treated? Treatment for plantar fasciitis depends on the severity of the condition. Your treatment may include:  Rest, ice, and over-the-counter pain medicines to manage your pain.  Exercises to stretch your calves and your plantar fascia.  A splint that holds your foot in a stretched, upward position while you sleep (night  splint).  Physical therapy to relieve symptoms and prevent problems in the future.  Cortisone injections to relieve severe pain.  Extracorporeal shock wave therapy (ESWT) to stimulate damaged plantar fascia with electrical impulses. It is often used as a last resort before surgery.  Surgery, if other treatments have not worked after 12 months.  Follow these instructions at home:  Take medicines only as directed by your health care provider.  Avoid activities that cause pain.  Roll the bottom of your foot over a bag of ice or a bottle of cold water. Do this for 20 minutes, 3-4 times a day.  Perform simple stretches as directed by your health care provider.  Try wearing athletic shoes with air-sole or gel-sole cushions or soft shoe inserts.  Wear a night splint while sleeping, if directed by your health care provider.  Keep all follow-up appointments with your health care provider. How is this prevented?  Do not perform exercises or activities that cause heel pain.  Consider finding low-impact activities if you continue to have problems.  Lose weight if you need to. The best way to prevent plantar fasciitis is to avoid the activities that aggravate your plantar fascia. Contact a health care provider if:  Your symptoms do not go away after treatment with home care measures.  Your pain gets worse.  Your pain affects your ability to move or do your daily activities. This information is not intended to replace advice given to you by your health care provider. Make sure you   discuss any questions you have with your health care provider. Document Released: 09/21/2000 Document Revised: 06/01/2015 Document Reviewed: 11/06/2013 Elsevier Interactive Patient Education  2018 [place].  Plantar Fasciitis Rehab Ask your health care provider which exercises are safe for you. Do exercises exactly as told by your health care provider and adjust them as directed. It is normal to feel  mild stretching, pulling, tightness, or discomfort as you do these exercises, but you should stop right away if you feel sudden pain or your pain gets worse. Do not begin these exercises until told by your health care provider. Stretching and range of motion exercises These exercises warm up your muscles and joints and improve the movement and flexibility of your foot. These exercises also help to relieve pain. Exercise A: Plantar fascia stretch  1. Sit with your left / right leg crossed over your opposite knee. 2. Hold your heel with one hand with that thumb near your arch. With your other hand, hold your toes and gently pull them back toward the top of your foot. You should feel a stretch on the bottom of your toes or your foot or both. 3. Hold this stretch for__________ seconds. 4. Slowly release your toes and return to the starting position. Repeat __________ times. Complete this exercise __________ times a day. Exercise B: Gastroc, standing  1. Stand with your hands against a wall. 2. Extend your left / right leg behind you, and bend your front knee slightly. 3. Keeping your heels on the floor and keeping your back knee straight, shift your weight toward the wall without arching your back. You should feel a gentle stretch in your left / right calf. 4. Hold this position for __________ seconds. Repeat __________ times. Complete this exercise __________ times a day. Exercise C: Soleus, standing 1. Stand with your hands against a wall. 2. Extend your left / right leg behind you, and bend your front knee slightly. 3. Keeping your heels on the floor, bend your back knee and slightly shift your weight over the back leg. You should feel a gentle stretch deep in your calf. 4. Hold this position for __________ seconds. Repeat __________ times. Complete this exercise __________ times a day. Exercise D: Gastrocsoleus, standing 1. Stand with the ball of your left / right foot on a step. The ball of  your foot is on the walking surface, right under your toes. 2. Keep your other foot firmly on the same step. 3. Hold onto the wall or a railing for balance. 4. Slowly lift your other foot, allowing your body weight to press your heel down over the edge of the step. You should feel a stretch in your left / right calf. 5. Hold this position for __________ seconds. 6. Return both feet to the step. 7. Repeat this exercise with a slight bend in your left / right knee. Repeat __________ times with your left / right knee straight and __________ times with your left / right knee bent. Complete this exercise __________ times a day. Balance exercise This exercise builds your balance and strength control of your arch to help take pressure off your plantar fascia. Exercise E: Single leg stand 1. Without shoes, stand near a railing or in a doorway. You may hold onto the railing or door frame as needed. 2. Stand on your left / right foot. Keep your big toe down on the floor and try to keep your arch lifted. Do not let your foot roll inward. 3. Hold this  position for __________ seconds. 4. If this exercise is too easy, you can try it with your eyes closed or while standing on a pillow. Repeat __________ times. Complete this exercise __________ times a day.   Call or return to clinic prn if these symptoms worsen or fail to improve as anticipated.

## 2016-09-19 NOTE — Progress Notes (Signed)
Subjective:    Patient ID: Haley [name], female    DOB: [birth year], 71 y.o.   MRN: [id number]  HPI  71 year old patient who presents today with a chief complaint of bilateral heel pain.  She states that this has been intermittent and started at the early part of the year. Is maximal over the heel area and worse when she first gets out of bed in the morning.  She has been using Advil and has bought some heel pads for support without much benefit.  She has been soaking her feet in Epsom salts She also describes some pain and swelling involving her right hand.  She is right handed and states that she is very active with activities throughout the day that may be aggravating the discomfort  Past Medical History:  Diagnosis Date  . Allergy    SEASONAL  . Cataract    BILATERAL  . Fibromyalgia   . GERD (gastroesophageal reflux disease)   . H/O echocardiogram 04/28/08   EF>55% trace mitral regurgitation, No significant valvular pathology  . Hemorrhoids    internal  . History of stress test 02/08/2011   Normal Myocardial perfusion study, this is a low risk scan, No prior study available for comparison  . Hyperlipidemia   . MVP (mitral valve prolapse)    mild and MR  . OA (osteoarthritis)   . Osteoporosis   . Paroxysmal A-fib ([place])   . Thyroid disease   . Varicosities of leg   . Vitamin D deficiency      Social History   Social History  . Marital status: Married    Spouse name: N/A  . Number of children: N/A  . Years of education: N/A   Occupational History  . Not on file.   Social History Main Topics  . Smoking status: Current Some Day Smoker    Types: Cigars  . Smokeless tobacco: Never Used     Comment: peach flavor   . Alcohol use No  . Drug use: No  . Sexual activity: Not on file   Other Topics Concern  . Not on file   Social History Narrative  . No narrative on file    Past Surgical History:  Procedure Laterality Date  . ABDOMINAL HYSTERECTOMY  1989  .  APPENDECTOMY  1989   with hysterectomy  . COLONOSCOPY  11-20-2000  . NOSE SURGERY  1990    Family History  Problem Relation Age of Onset  . Diabetes Daughter   . Diabetes Mother   . Hypertension Mother   . Heart disease Mother   . Stroke Maternal Grandmother   . Colon cancer Neg Hx     Allergies  Allergen Reactions  . Tramadol Hcl     REACTION: jittery    Current Outpatient Prescriptions on File Prior to Visit  Medication Sig Dispense Refill  . alendronate (FOSAMAX) 70 MG tablet Take 1 tablet (70 mg total) by mouth once a week. Take with a full glass of water on an empty stomach. 12 tablet 0  . ALPRAZolam (XANAX) 0.5 MG tablet TAKE ONE TABLET BY MOUTH ONCE DAILY AS NEEDED 90 tablet 3  . aspirin 81 MG tablet Take 81 mg by mouth daily.      . Calcium Carbonate (CALTRATE 600) 1500 MG TABS Take by mouth.      . cholecalciferol (VITAMIN D) 1000 UNITS tablet Take 1,000 Units by mouth daily.     . clobetasol cream (TEMOVATE) 0.05 % Apply twice a week  first week then once weekly as needed 30 g 2  . fluticasone (CUTIVATE) 0.05 % cream Apply 1 application topically 2 (two) times daily.     . fluticasone (FLONASE) 50 MCG/ACT nasal spray Place 2 sprays into both nostrils daily. 48 g 3  . hydrochlorothiazide (HYDRODIURIL) 12.5 MG tablet Take 1 tablet (12.5 mg total) by mouth every other day. 45 tablet 3  . ibuprofen (ADVIL,MOTRIN) 800 MG tablet Take 1 tablet (800 mg total) by mouth every 6 (six) hours as needed. 60 tablet 0  . levothyroxine (SYNTHROID, LEVOTHROID) 75 MCG tablet Take 1 tablet (75 mcg total) by mouth daily. 90 tablet 3  . metoprolol succinate (TOPROL-XL) 25 MG 24 hr tablet Take 1 tablet (25 mg total) by mouth daily. 90 tablet 3  . mirabegron ER (MYRBETRIQ) 50 MG TB24 tablet Take 1 tablet (50 mg total) by mouth daily. 30 tablet 4  . Multiple Vitamins-Minerals (MULTIVITAMIN WITH MINERALS) tablet Take 1 tablet by mouth daily.      . Omega-3 Fatty Acids (FISH OIL) 1000 MG CAPS  Take 2 capsules by mouth 2 (two) times daily.    . simvastatin (ZOCOR) 20 MG tablet Take 1 tablet (20 mg total) by mouth at bedtime. 90 tablet 3  . tretinoin (RETIN-A) 0.025 % cream Apply topically at bedtime.     . vitamin C (ASCORBIC ACID) 500 MG tablet Take 500 mg by mouth daily.       No current facility-administered medications on file prior to visit.     BP 132/76 (BP Location: Left Arm, Patient Position: Sitting, Cuff Size: Normal)   Pulse 80   Temp 98.5 F (36.9 C) (Oral)   Ht 5\' 2"  (1.575 m)   Wt 143 lb 9.6 oz (65.1 kg)   SpO2 97%   BMI 26.26 kg/m     Review of Systems  Constitutional: Negative.   HENT: Negative for congestion, dental problem, hearing loss, rhinorrhea, sinus pressure, sore throat and tinnitus.   Eyes: Negative for pain, discharge and visual disturbance.  Respiratory: Negative for cough and shortness of breath.   Cardiovascular: Negative for chest pain, palpitations and leg swelling.  Gastrointestinal: Negative for abdominal distention, abdominal pain, blood in stool, constipation, diarrhea, nausea and vomiting.  Genitourinary: Negative for difficulty urinating, dysuria, flank pain, frequency, hematuria, pelvic pain, urgency, vaginal bleeding, vaginal discharge and vaginal pain.  Musculoskeletal: Positive for arthralgias and gait problem. Negative for joint swelling.  Skin: Negative for rash.  Neurological: Negative for dizziness, syncope, speech difficulty, weakness, numbness and headaches.  Hematological: Negative for adenopathy.  Psychiatric/Behavioral: Negative for agitation, behavioral problems and dysphoric mood. The patient is not nervous/anxious.        Objective:   Physical Exam  Constitutional: She appears well-developed and well-nourished. No distress.  Musculoskeletal:   Mild tenderness over the medial aspect of both heels , right hand revealed some mild osteoarthritic changes          Assessment & Plan:   Bilateral plantar  fasciitis.  Patient information dispensed.  Exercises discussed and information supplied Osteoarthritis Right hand tendinitis  Trial meloxicam Refer sports medicine if unimproved  Nyoka Cowden

## 2016-10-18 ENCOUNTER — Encounter: Payer: Self-pay | Admitting: Cardiovascular Disease

## 2016-10-18 ENCOUNTER — Ambulatory Visit (INDEPENDENT_AMBULATORY_CARE_PROVIDER_SITE_OTHER): Payer: PPO | Admitting: Cardiovascular Disease

## 2016-10-18 VITALS — BP 152/72 | HR 56 | Ht 62.0 in | Wt 143.6 lb

## 2016-10-18 DIAGNOSIS — E039 Hypothyroidism, unspecified: Secondary | ICD-10-CM

## 2016-10-18 DIAGNOSIS — R0683 Snoring: Secondary | ICD-10-CM

## 2016-10-18 DIAGNOSIS — R0681 Apnea, not elsewhere classified: Secondary | ICD-10-CM

## 2016-10-18 DIAGNOSIS — E785 Hyperlipidemia, unspecified: Secondary | ICD-10-CM | POA: Diagnosis not present

## 2016-10-18 DIAGNOSIS — I10 Essential (primary) hypertension: Secondary | ICD-10-CM

## 2016-10-18 DIAGNOSIS — I480 Paroxysmal atrial fibrillation: Secondary | ICD-10-CM

## 2016-10-18 MED ORDER — LOSARTAN POTASSIUM 25 MG PO TABS: 25.0000 mg | ORAL_TABLET | Freq: Every day | ORAL | 0 refills | Status: DC

## 2016-10-18 MED ORDER — LOSARTAN POTASSIUM 25 MG PO TABS: 25.0000 mg | ORAL_TABLET | Freq: Every day | ORAL | 3 refills | Status: DC

## 2016-10-18 NOTE — Patient Instructions (Addendum)
Medication Instructions:  START Losartan 25 mg (1 tablet) daily.  Testing/Procedures: Your physician has recommended that you have a sleep study. This test records several body functions during sleep, including: brain activity, eye movement, oxygen and carbon dioxide blood levels, heart rate and rhythm, breathing rate and rhythm, the flow of air through your mouth and nose, snoring, body muscle movements, and chest and belly movement.  Follow-Up: Your physician recommends that you schedule a follow-up appointment in: 4 MONTHS with Dr. Claiborne Billings.    Any Other Special Instructions Will Be Listed Below (If Applicable).     If you need a refill on your cardiac medications before your next appointment, please call your pharmacy.

## 2016-10-18 NOTE — Progress Notes (Signed)
Patient ID: Haley [name], female   DOB: [birth year], 71 y.o.   MRN: [id number]    PCP: Dr. Bluford Kaufmann  HPI: Haley [name] is a 71 y.o. female presents to the office today for an 11 month followup evaluation.  Haley [name]  has a history of palpitations, paroxysmal atrial fibrillation, hyperlipidemia, GERD, and hypothyroidism. She has been found to have an atherogenic dyslipidemia lipid panel. She also has a history of lower extremity varicosities and wears support stockings. Remotely she was having difficulty remembering to take her metoprolol on a twice a day regimen at that time I switched her to metoprolol succinate Toprol-XL 25 mg daily. She has felt markedly improved with reference to taking this once a day preparation. She is unaware of any breakthrough tachycardia palpitations. She denies presyncope or syncope. An NMR lipoprofile  showed marked improvement with a cholesterol of 174, triglycerides 119, HDL C. cholesterol 60, and LDL cholesterol at 90. Her LDL particle number was 1020 and her HDL particle number was 39.5. Her insulin resistance score was 39 which was normal.   Laboratory last year showed a total cholesterol 165, triglycerides 100, HDL 54, LDL 91.  TSH was 2.03.  She was not anemic.  She normal chemistry profile.  Since I last saw her, she denies any episodes of chest pain or significant palpitations.  She admits to occasional leg cramps.  She also at times note some very mild left ankle swelling.  She has had issues with bladder incontinence and sees Dr. Toney Rakes. She has been taking Toprol 25 mg daily.  She is tolerated simvastatin 20 mg for hyperlipidemia.  She is on 75 g of levothyroxine for hypothyroidism.  She also has osteopenia for which she takes Fosamax.    Since I last saw her, she denies any episodes of chest pain.  She admits to intermittent right leg discomfort.  She tells me that her husband has witnessed apneic spells at night.  She has a history of snoring.   Recently she's been monitoring her blood pressure and her blood pressure has become more elevated systolically.  She tells me she will be seeing her primary physician next month.  Laboratory will be drawn at that time.  She presents for evaluation.   Past Medical History:  Diagnosis Date  . Allergy    SEASONAL  . Cataract    BILATERAL  . Fibromyalgia   . GERD (gastroesophageal reflux disease)   . H/O echocardiogram 04/28/08   EF>55% trace mitral regurgitation, No significant valvular pathology  . Hemorrhoids    internal  . History of stress test 02/08/2011   Normal Myocardial perfusion study, this is a low risk scan, No prior study available for comparison  . Hyperlipidemia   . MVP (mitral valve prolapse)    mild and MR  . OA (osteoarthritis)   . Osteoporosis   . Paroxysmal A-fib ([place])   . Thyroid disease   . Varicosities of leg   . Vitamin D deficiency     Past Surgical History:  Procedure Laterality Date  . ABDOMINAL HYSTERECTOMY  1989  . APPENDECTOMY  1989   with hysterectomy  . COLONOSCOPY  11-20-2000  . NOSE SURGERY  1990    Allergies  Allergen Reactions  . Tramadol Hcl     REACTION: jittery    Current Outpatient Prescriptions  Medication Sig Dispense Refill  . alendronate (FOSAMAX) 70 MG tablet Take 1 tablet (70 mg total) by mouth once a week. Take with a full  glass of water on an empty stomach. 12 tablet 0  . ALPRAZolam (XANAX) 0.5 MG tablet TAKE ONE TABLET BY MOUTH ONCE DAILY AS NEEDED 90 tablet 3  . aspirin 81 MG tablet Take 81 mg by mouth daily.      . Calcium Carbonate (CALTRATE 600) 1500 MG TABS Take by mouth.      . cholecalciferol (VITAMIN D) 1000 UNITS tablet Take 1,000 Units by mouth daily.     . clobetasol cream (TEMOVATE) 0.05 % Apply twice a week first week then once weekly as needed 30 g 2  . fluticasone (CUTIVATE) 0.05 % cream Apply 1 application topically 2 (two) times daily.     . fluticasone (FLONASE) 50 MCG/ACT nasal spray Place 2 sprays  into both nostrils daily. 48 g 3  . ibuprofen (ADVIL,MOTRIN) 800 MG tablet Take 1 tablet (800 mg total) by mouth every 6 (six) hours as needed. 60 tablet 0  . levothyroxine (SYNTHROID, LEVOTHROID) 75 MCG tablet Take 1 tablet (75 mcg total) by mouth daily. 90 tablet 3  . meloxicam (MOBIC) 15 MG tablet Take 1 tablet (15 mg total) by mouth daily. 30 tablet 0  . metoprolol succinate (TOPROL-XL) 25 MG 24 hr tablet Take 1 tablet (25 mg total) by mouth daily. 90 tablet 3  . Multiple Vitamins-Minerals (MULTIVITAMIN WITH MINERALS) tablet Take 1 tablet by mouth daily.      . Omega-3 Fatty Acids (FISH OIL) 1000 MG CAPS Take 2 capsules by mouth 2 (two) times daily.    . simvastatin (ZOCOR) 20 MG tablet Take 1 tablet (20 mg total) by mouth at bedtime. 90 tablet 3  . tretinoin (RETIN-A) 0.025 % cream Apply topically at bedtime.     . vitamin C (ASCORBIC ACID) 500 MG tablet Take 500 mg by mouth daily.      Marland Kitchen losartan (COZAAR) 25 MG tablet Take 1 tablet (25 mg total) by mouth daily. 30 tablet 0   No current facility-administered medications for this visit.     Socially she is married and has 2 children and 4 grandchildren with one great grandchild. She smokes little cigars. [facility].  ROS General: Negative; No fevers, chills, or night sweats;  HEENT: Negative; No changes in vision or hearing, sinus congestion, difficulty swallowing Pulmonary: Negative; No cough, wheezing, shortness of breath, hemoptysis Cardiovascular: Negative; No chest pain, presyncope, syncope, palpitations; occasional left ankle swelling GI: Negative; No nausea, vomiting, diarrhea, or abdominal pain GU: Negative; No dysuria, hematuria, or difficulty voiding Musculoskeletal:Positive for right leg discomfort; no myalgias, joint pain, or weakness Hematologic/Oncology: Negative; no easy bruising, bleeding Endocrine: Positive for hypothyroidism, on Synthroid e; no diabetes Neuro:  Negative; no changes in balance, headaches Skin: Negative; No rashes or skin lesions Psychiatric: Negative; No behavioral problems, depression Sleep: Positive for witnessed apnea and snoring.  She denies  daytime sleepiness, hypersomnolence, bruxism, restless legs, hypnogognic hallucinations, no cataplexy Other comprehensive 14 point system review is negative.   PE BP (!) 152/72   Pulse (!) 56   Ht _0  (1.575 m)   Wt 143 lb 9.6 oz (65.1 kg)   BMI 26.26 kg/m    Repeat blood pressure by me was139/80  Wt Readings from Last 3 Encounters:  10/18/16 143 lb 9.6 oz (65.1 kg)  09/19/16 143 lb 9.6 oz (65.1 kg)  07/27/16 142 lb (64.4 kg)   General: Alert, oriented, no distress.  Skin: normal turgor, no rashes, warm and dry HEENT: Normocephalic,  atraumatic. Pupils equal round and reactive to light; sclera anicteric; extraocular muscles intact;  Nose without nasal septal hypertrophy Mouth/Parynx benign; Mallinpatti scale 2/3 Neck: No JVD, no carotid bruits; normal carotid upstroke Lungs: clear to ausculatation and percussion; no wheezing or rales Chest wall: without tenderness to palpitation Heart: PMI not displaced, RRR, s1 s2 normal, 1/6 systolic murmur, no diastolic murmur, no rubs, gallops, thrills, or heaves Abdomen: soft, nontender; no hepatosplenomehaly, BS+; abdominal aorta nontender and not dilated by palpation. Back: no CVA tenderness Pulses 2+ Musculoskeletal: full range of motion, normal strength, no joint deformities Extremities: trivialankle edema; no clubbing cyanosis or edema, Homan's sign negative  Neurologic: grossly nonfocal; Cranial nerves grossly wnl Psychologic: Normal mood and affect   ECG (independently read by me): Sinus bradycardia 56 bpm.  QS V1, V2.  Normal intervals.  November 2017 ECG (independently read by me): Sinus bradycardia 52 bpm.  QS complex precordially.  November 2016 ECG (independently read by me): Sinus bradycardia 52 bpm.  Normal intervals.   No ST segment changes.  April 2016 ECG (independently read by me): Sinus bradycardia at 54 bpm.  Normal intervals.  No significant ST segment changes  September 2015 ECG (independently read by me): Sinus bradycardia 52 beats per minute.  QS complex in V1 and V2.  Nonspecific ST change.  QTc interval 398 ms  Prior March 2015 ECG (independently read by me): Sinus bradycardia 51 beats per minute. No ectopy. Normal intervals.  Prior 09/14/2012 ECG: Sinus rhythm at 53 beats per minute. She has poor anterior R-wave progression. Normal intervals  LABS:  BMP Latest Ref Rng & Units 11/17/2015 04/25/2014 08/13/2013  Glucose 65 - 99 mg/dL 84 86 75  BUN 7 - 25 mg/dL _0 Creatinine 0.60 - 0.93 mg/dL 0.76 0.66 0.8  Sodium 135 - 146 mmol/L 140 139 141  Potassium 3.5 - 5.3 mmol/L 4.1 3.7 3.9  Chloride 98 - 110 mmol/L 106 104 105  CO2 20 - 31 mmol/L _1 Calcium 8.6 - 10.4 mg/dL 9.3 9.0 9.3    Hepatic Function Latest Ref Rng & Units 11/17/2015 04/25/2014 08/13/2013  Total Protein 6.1 - 8.1 g/dL 6.5 6.5 6.8  Albumin 3.6 - 5.1 g/dL 3.8 3.7 3.9  AST 10 - 35 U/L _2 ALT 6 - 29 U/L _3 Alk Phosphatase 33 - 130 U/L 67 75 59  Total Bilirubin 0.2 - 1.2 mg/dL 0.4 0.3 0.9  Bilirubin, Direct 0.0 - 0.3 mg/dL - - -    CBC Latest Ref Rng & Units 11/17/2015 10/24/2014 04/25/2014  WBC 3.8 - 10.8 K/uL 8.7 9.8 7.9  Hemoglobin 11.7 - 15.5 g/dL 13.4 13.7 13.0  Hematocrit 35.0 - 45.0 % 41.3 41.0 39.1  Platelets 140 - 400 K/uL 222 227.0 243   Lab Results  Component Value Date   MCV 91.2 11/17/2015   MCV 89.9 10/24/2014   MCV 87.5 04/25/2014    Lab Results  Component Value Date   TSH 1.94 11/17/2015   Lab Results  Component Value Date   HGBA1C 6.0 08/06/2012   BNP No results found for: PROBNP  Lipid Panel     Component Value Date/Time   CHOL 152 11/17/2015 1005   TRIG 184 (H) 11/17/2015 1005   HDL 51 11/17/2015 1005   CHOLHDL 3.0 11/17/2015 1005   VLDL 37 (H) 11/17/2015 1005    LDLCALC 64 11/17/2015 1005     RADIOLOGY: No results found.   IMPRESSION:  1.  PAF (paroxysmal atrial fibrillation) ([place])   2. Essential hypertension   3. Snoring   4. Witnessed apneic spells   5. Dyslipidemia   6. Hypothyroidism, unspecified type      ASSESSMENT AND PLAN: Ms. Klenke is a 71 year old female who has a history of palpitations that have improved with once a day long-acting beta blocker therapy.  She has not had any recurrent atrial fibrillation and  is maintaining sinus rhythm.  She is bradycardic and remains asymptomatic.  She specifically denies dizziness or orthostatic symptoms.  An  echo in 2013 demonstrated normal systolic and diastolic function with mild aortic valve sclerosis without stenosis. .  A nuclear perfusion study  in January 2013 showed normal perfusion without scar or ischemia and she had a post stress ejection fraction of 75%.  Over the last several office visits, her blood pressure has been elevated.  Her blood pressure today was 152/78.  Upon arrival and on repeat was 138/80.  I reviewed with her new hypertensive guideline recommendations.  I also discussed with her the beta blocker therapy is not first-line hypertensive treatment.  I'm electing to add low-dose losartan, initially at 25 mg.  She has an appointment to see Dr. Burnice Logan next month who can reassess the benefit of this low-dose.  If her blood pressure is still consistently staying above 1:30.  I would then recommend further titration to 50 mg daily.  Upon further questioning, she states that her husband has witnessed that she stops breathing at nighttime while sleeping.  She admits to snoring at times fairly loud.  For further evaluation.  I am recommending that she undergo a sleep study to assess for obstructive sleep apnea.  She will have follow-up blood work in several weeks with her primary physician.  She continues to be on simvastatin for hyperlipidemia and denies myalgias.  She has  hypothyroidism and is maintained on levothyroxine, which she is tolerating.  I will see her in 4 months for cardiology reevaluation.   Time spent: 25 minutes  Troy Sine, MD, [employer]  10/18/2016 2:01 PM

## 2016-10-31 ENCOUNTER — Ambulatory Visit (INDEPENDENT_AMBULATORY_CARE_PROVIDER_SITE_OTHER): Payer: PPO | Admitting: Internal Medicine

## 2016-10-31 ENCOUNTER — Encounter: Payer: Self-pay | Admitting: Internal Medicine

## 2016-10-31 VITALS — BP 132/62 | HR 60 | Temp 98.2°F | Ht 62.0 in | Wt 145.4 lb

## 2016-10-31 DIAGNOSIS — I480 Paroxysmal atrial fibrillation: Secondary | ICD-10-CM

## 2016-10-31 DIAGNOSIS — E039 Hypothyroidism, unspecified: Secondary | ICD-10-CM

## 2016-10-31 DIAGNOSIS — M818 Other osteoporosis without current pathological fracture: Secondary | ICD-10-CM | POA: Diagnosis not present

## 2016-10-31 DIAGNOSIS — E785 Hyperlipidemia, unspecified: Secondary | ICD-10-CM

## 2016-10-31 DIAGNOSIS — Z720 Tobacco use: Secondary | ICD-10-CM

## 2016-10-31 DIAGNOSIS — Z0000 Encounter for general adult medical examination without abnormal findings: Secondary | ICD-10-CM | POA: Diagnosis not present

## 2016-10-31 LAB — CBC WITH DIFFERENTIAL/PLATELET
BASOS ABS: 0.1 10*3/uL (ref 0.0–0.1)
Basophils Relative: 1 % (ref 0.0–3.0)
Eosinophils Absolute: 0.7 10*3/uL (ref 0.0–0.7)
Eosinophils Relative: 8.1 % — ABNORMAL HIGH (ref 0.0–5.0)
HCT: 41.5 % (ref 36.0–46.0)
Hemoglobin: 13.8 g/dL (ref 12.0–15.0)
LYMPHS ABS: 2.1 10*3/uL (ref 0.7–4.0)
Lymphocytes Relative: 23.8 % (ref 12.0–46.0)
MCHC: 33.2 g/dL (ref 30.0–36.0)
MCV: 91 fl (ref 78.0–100.0)
MONO ABS: 0.6 10*3/uL (ref 0.1–1.0)
MONOS PCT: 6.9 % (ref 3.0–12.0)
NEUTROS ABS: 5.4 10*3/uL (ref 1.4–7.7)
NEUTROS PCT: 60.2 % (ref 43.0–77.0)
PLATELETS: 219 10*3/uL (ref 150.0–400.0)
RBC: 4.55 Mil/uL (ref 3.87–5.11)
RDW: 13.7 % (ref 11.5–15.5)
WBC: 9 10*3/uL (ref 4.0–10.5)

## 2016-10-31 LAB — LIPID PANEL
CHOL/HDL RATIO: 3
Cholesterol: 168 mg/dL (ref 0–200)
HDL: 56 mg/dL (ref >39.00)
LDL Cholesterol: 85 mg/dL (ref 0–99)
NONHDL: 112.19
Triglycerides: 135 mg/dL (ref 0.0–149.0)
VLDL: 27 mg/dL (ref 0.0–40.0)

## 2016-10-31 LAB — COMPREHENSIVE METABOLIC PANEL
ALK PHOS: 64 U/L (ref 39–117)
ALT: 11 U/L (ref 0–35)
AST: 12 U/L (ref 0–37)
Albumin: 4 g/dL (ref 3.5–5.2)
BILIRUBIN TOTAL: 0.4 mg/dL (ref 0.2–1.2)
BUN: 16 mg/dL (ref 6–23)
CO2: 30 meq/L (ref 19–32)
CREATININE: 0.68 mg/dL (ref 0.40–1.20)
Calcium: 9.3 mg/dL (ref 8.4–10.5)
Chloride: 106 mEq/L (ref 96–112)
GFR: 90.24 mL/min (ref >60.00)
GLUCOSE: 89 mg/dL (ref 70–99)
Potassium: 4 mEq/L (ref 3.5–5.1)
SODIUM: 143 meq/L (ref 135–145)
TOTAL PROTEIN: 6.5 g/dL (ref 6.0–8.3)

## 2016-10-31 LAB — TSH: TSH: 2.09 u[IU]/mL (ref 0.35–4.50)

## 2016-10-31 NOTE — Patient Instructions (Signed)
Limit your sodium (Salt) intake  Please check your blood pressure on a regular basis.  If it is consistently greater than 150/90, please make an office appointment.    It is important that you exercise regularly, at least 20 minutes 3 to 4 times per week.  If you develop chest pain or shortness of breath seek  medical attention.  Take a calcium supplement, plus [phone] units of vitamin D  Return in 6 months for follow-up

## 2016-10-31 NOTE — Progress Notes (Signed)
Subjective:    Patient ID: Haley [name], female    DOB: 1945/09/01, 71 y.o.   MRN: [id number]  HPI  71 year old patient who is seen today for an annual health examination and subsequent Medicare wellness visit She has recently been seen by cardiology for PAF.  Losartan 25 was added to her blood pressure regimen. She has a history of hypothyroidism and dyslipidemia She has osteoporosis and remains on statin therapy for dyslipidemia. No cardiopulmonary complaints She had colonoscopy in 2014 Cardiology has scheduled her for a sleep study to rule out OSA She did have a follow-up bone density study earlier this year.  She is followed by gynecology.  Past Medical History:  Diagnosis Date  . Allergy    SEASONAL  . Cataract    BILATERAL  . Fibromyalgia   . GERD (gastroesophageal reflux disease)   . H/O echocardiogram 04/28/08   EF>55% trace mitral regurgitation, No significant valvular pathology  . Hemorrhoids    internal  . History of stress test 02/08/2011   Normal Myocardial perfusion study, this is a low risk scan, No prior study available for comparison  . Hyperlipidemia   . MVP (mitral valve prolapse)    mild and MR  . OA (osteoarthritis)   . Osteoporosis   . Paroxysmal A-fib ([place])   . Thyroid disease   . Varicosities of leg   . Vitamin D deficiency      Social History   Social History  . Marital status: Married    Spouse name: N/A  . Number of children: N/A  . Years of education: N/A   Occupational History  . Not on file.   Social History Main Topics  . Smoking status: Current Some Day Smoker    Types: Cigars  . Smokeless tobacco: Never Used     Comment: peach flavor   . Alcohol use No  . Drug use: No  . Sexual activity: Not on file   Other Topics Concern  . Not on file   Social History Narrative  . No narrative on file    Past Surgical History:  Procedure Laterality Date  . ABDOMINAL HYSTERECTOMY  1989  . APPENDECTOMY  1989   with hysterectomy    . COLONOSCOPY  11-20-2000  . NOSE SURGERY  1990    Family History  Problem Relation Age of Onset  . Diabetes Daughter   . Diabetes Mother   . Hypertension Mother   . Heart disease Mother   . Stroke Maternal Grandmother   . Colon cancer Neg Hx     Allergies  Allergen Reactions  . Tramadol Hcl     REACTION: jittery    Current Outpatient Prescriptions on File Prior to Visit  Medication Sig Dispense Refill  . alendronate (FOSAMAX) 70 MG tablet Take 1 tablet (70 mg total) by mouth once a week. Take with a full glass of water on an empty stomach. 12 tablet 0  . ALPRAZolam (XANAX) 0.5 MG tablet TAKE ONE TABLET BY MOUTH ONCE DAILY AS NEEDED 90 tablet 3  . aspirin 81 MG tablet Take 81 mg by mouth daily.      . Calcium Carbonate (CALTRATE 600) 1500 MG TABS Take by mouth.      . cholecalciferol (VITAMIN D) 1000 UNITS tablet Take 1,000 Units by mouth daily.     . clobetasol cream (TEMOVATE) 0.05 % Apply twice a week first week then once weekly as needed 30 g 2  . fluticasone (CUTIVATE) 0.05 % cream Apply  1 application topically 2 (two) times daily.     . fluticasone (FLONASE) 50 MCG/ACT nasal spray Place 2 sprays into both nostrils daily. 48 g 3  . ibuprofen (ADVIL,MOTRIN) 800 MG tablet Take 1 tablet (800 mg total) by mouth every 6 (six) hours as needed. 60 tablet 0  . levothyroxine (SYNTHROID, LEVOTHROID) 75 MCG tablet Take 1 tablet (75 mcg total) by mouth daily. 90 tablet 3  . losartan (COZAAR) 25 MG tablet Take 1 tablet (25 mg total) by mouth daily. 30 tablet 0  . meloxicam (MOBIC) 15 MG tablet Take 1 tablet (15 mg total) by mouth daily. 30 tablet 0  . metoprolol succinate (TOPROL-XL) 25 MG 24 hr tablet Take 1 tablet (25 mg total) by mouth daily. 90 tablet 3  . Multiple Vitamins-Minerals (MULTIVITAMIN WITH MINERALS) tablet Take 1 tablet by mouth daily.      . Omega-3 Fatty Acids (FISH OIL) 1000 MG CAPS Take 2 capsules by mouth 2 (two) times daily.    . simvastatin (ZOCOR) 20 MG tablet  Take 1 tablet (20 mg total) by mouth at bedtime. 90 tablet 3  . tretinoin (RETIN-A) 0.025 % cream Apply topically at bedtime.     . vitamin C (ASCORBIC ACID) 500 MG tablet Take 500 mg by mouth daily.       No current facility-administered medications on file prior to visit.     BP 132/62 (BP Location: Left Arm, Patient Position: Sitting, Cuff Size: Normal)   Pulse 60   Temp 98.2 F (36.8 C) (Oral)   Ht 5\' 2"  (1.575 m)   Wt 145 lb 6.4 oz (66 kg)   SpO2 99%   BMI 26.59 kg/m   Subsequent Medicare wellness visit  1. Risk factors, based on past  M,S,F history.  Current vascular risk factors include hypertension and dyslipidemia.  She has a history of tobacco use  2.  Physical activities: no exercise limitations.  Walks without difficulty, but no regimented exercise plan  3.  Depression/mood:history of anxiety disorder, but no major depression  4.  Hearing:no deficits  5.  ADL's:independent  6.  Fall risk:low  7.  Home safety:no problems identified  8.  Height weight, and visual acuity;height and weight stable no change in visual acuity is followed by ophthalmology at least annually  9.  Counseling:more active lifestyle and exercise regimen encouraged heart healthy diet recommended  10. Lab orders based on risk factors:laboratory update will be reviewed, including TSH and lipid profile  11. Referral :follow-up cardiology, gynecology.  Mammogram to be obtained later this year  45. Care plan: continue efforts at aggressive risk factor modification  13. Cognitive assessment: alert and oriented with normal affect.  No cognitive dysfunction  14. Screening: Patient provided with a written and personalized 5-10 year screening schedule in the AVS.    15. Provider List Update: gynecology cardiology, primary care radiology and ophthalmology     Review of Systems     Objective:   Physical Exam  Constitutional: She is oriented to person, place, and time. She appears  well-developed and well-nourished.  HENT:  Head: Normocephalic and atraumatic.  Right Ear: External ear normal.  Left Ear: External ear normal.  Mouth/Throat: Oropharynx is clear and moist.  Low hanging soft palate with crowding  Eyes: Conjunctivae and EOM are normal.  Neck: Normal range of motion. Neck supple. No JVD present. No thyromegaly present.  Cardiovascular: Normal rate, regular rhythm, normal heart sounds and intact distal pulses.   No murmur heard. Pulmonary/Chest:  Effort normal and breath sounds normal. She has no wheezes. She has no rales.  Abdominal: Soft. Bowel sounds are normal. She exhibits no distension and no mass. There is no tenderness. There is no rebound and no guarding.  Genitourinary: Vagina normal.  Musculoskeletal: Normal range of motion. She exhibits no edema or tenderness.  Neurological: She is alert and oriented to person, place, and time. She has normal reflexes. No cranial nerve deficit. She exhibits normal muscle tone. Coordination normal.  Skin: Skin is warm and dry. No rash noted.  Psychiatric: She has a normal mood and affect. Her behavior is normal.          Assessment & Plan:   Preventive health examination.  Declines flu vaccine.  Consider low-dose chest CT scanning.  Will discuss Subsequent Medicare wellness visit Hypothyroidism.  We'll review a TSH Dyslipidemia.  Review lipid profile Osteoporosis.  Continue calcium and vitamin D supplements.  Follow-up GYN.  Patient has been on Fosamax.  Bone density study stable in the spring of this year  Follow-up 6 months  Mariha Sleeper Pilar Plate

## 2016-11-01 LAB — HEPATITIS C ANTIBODY
Hepatitis C Ab: NONREACTIVE
SIGNAL TO CUT-OFF: 0.02 (ref <1.00)

## 2016-11-03 ENCOUNTER — Other Ambulatory Visit: Payer: Self-pay | Admitting: Gynecology

## 2016-11-14 ENCOUNTER — Other Ambulatory Visit: Payer: Self-pay | Admitting: Internal Medicine

## 2016-11-14 ENCOUNTER — Encounter: Payer: PPO | Admitting: Gynecology

## 2016-11-14 DIAGNOSIS — Z1231 Encounter for screening mammogram for malignant neoplasm of breast: Secondary | ICD-10-CM

## 2016-11-15 ENCOUNTER — Encounter: Payer: Self-pay | Admitting: Gynecology

## 2016-11-15 ENCOUNTER — Ambulatory Visit: Payer: PPO | Admitting: Gynecology

## 2016-11-15 VITALS — BP 124/82 | Ht 62.0 in | Wt 144.0 lb

## 2016-11-15 DIAGNOSIS — Z01411 Encounter for gynecological examination (general) (routine) with abnormal findings: Secondary | ICD-10-CM

## 2016-11-15 DIAGNOSIS — N393 Stress incontinence (female) (male): Secondary | ICD-10-CM

## 2016-11-15 DIAGNOSIS — Z1272 Encounter for screening for malignant neoplasm of vagina: Secondary | ICD-10-CM

## 2016-11-15 DIAGNOSIS — M810 Age-related osteoporosis without current pathological fracture: Secondary | ICD-10-CM

## 2016-11-15 DIAGNOSIS — N952 Postmenopausal atrophic vaginitis: Secondary | ICD-10-CM | POA: Diagnosis not present

## 2016-11-15 NOTE — Progress Notes (Signed)
Haley [name] 1945-11-20 [id number]        71 y.o.  G3P3 for breast and pelvic exam.  Former patient of Dr. Toney Rakes.  History of osteoporosis on Fosamax.  Also complaining of urinary incontinence.  Notes loss of urine with coughing laughing sneezing.  Some urgency symptoms she holds her urine.  No frequency, dysuria, low back pain, fever or chills.  Has noticed the urinary incontinence for over a year.  Status post hysterectomy/appendectomy in the past due to leiomyoma, dysmenorrhea and menorrhagia.  Past medical history,surgical history, problem list, medications, allergies, family history and social history were all reviewed and documented as reviewed in the EPIC chart.  ROS:  Performed with pertinent positives and negatives included in the history, assessment and plan.   Additional significant findings : None   Exam: Caryn Bee assistant Vitals:   11/15/16 0851  BP: 124/82  Weight: 144 lb (65.3 kg)  Height: 5\' 2"  (1.575 m)   Body mass index is 26.34 kg/m.  General appearance:  Normal affect, orientation and appearance. Skin: Grossly normal HEENT: Without gross lesions.  No cervical or supraclavicular adenopathy. Thyroid normal.  Lungs:  Clear without wheezing, rales or rhonchi Cardiac: RR, without RMG Abdominal:  Soft, nontender, without masses, guarding, rebound, organomegaly or hernia Breasts:  Examined lying and sitting without masses, retractions, discharge or axillary adenopathy. Pelvic:  Ext, BUS, Vagina: With atrophic changes.  Pap smear done.  No significant cystocele/rectocele.  Vaginal cuff well supported.  Adnexa: Without masses or tenderness    Anus and perineum: Normal   Rectovaginal: Normal sphincter tone without palpated masses or tenderness.    Assessment/Plan:  71 y.o. G3P3 female for breast and pelvic exam.   1. Urinary incontinence.  Primarily stress symptoms with loss of urine with coughing laughing sneezing.  Some mild urgency symptoms but it  appears mostly when her bladder is full.  She does drink coffee and smoke.  Issues of incontinence reviewed with the patient and options for management to include behavior modification, medications and surgery discussed.  Pros versus cons of each option were reviewed.  Patient not interested in surgery or medications at this time.  He is going to try more frequent voiding to keep her bladder a little more empty to see if this does not help with her symptoms.  I also reviewed Kegel exercises with her.  Patient will follow-up if this continues to be an issue that she wants to consider referral to urology. 2. Osteoporosis.   DEXA 05/2016.  Has been on Fosamax since 2010 per Dr. Sandrea Hughs note.  Risks of medication were reviewed with her to include possible increased risk with prolonged use of atypical fractures.  Options for management reviewed to include continuing Fosamax, switching to a different medication such as Prolia or discontinuing the medication altogether recognizing benefit of the Fosamax may continue during a drug-free holiday and we study her in 2 years.  Patient is comfortable with stopping her Fosamax now whenever she is done with whatever medication she has at home and then will plan on repeating her bone density in 2 years.  Stop smoking benefits from a bone health standpoint reviewed as well as general health benefits. 3. Pap smear 2015.  Pap smear of vaginal cuff done today.  No history of abnormal Pap smears previously.  Options to stop screening per current screening guidelines based on age and hysterectomy history reviewed.  Will readdress on an annual basis. 4. Colonoscopy 2016.  Repeat at their  recommended interval. 5. Mammography coming due in December and I reminded her to schedule this.  Breast exam normal today. 6. Health maintenance.  No routine lab work done as patient does this elsewhere.  Follow-up 1 year, sooner as needed.  Additional time in excess of her breast and pelvic  exam was spent in direct face to face counseling and coordination of care in regards to her urinary incontinence and osteoporosis with medication management.   Anastasio Auerbach MD, 9:26 AM 11/15/2016

## 2016-11-15 NOTE — Addendum Note (Signed)
Addended by: Nelva Nay on: 11/15/2016 09:54 AM   Modules accepted: Orders

## 2016-11-15 NOTE — Patient Instructions (Signed)
Stop the Fosamax when you are done with whatever medication you have at home.  We will plan on repeating the bone density in 2 years.

## 2016-11-16 LAB — URINALYSIS W MICROSCOPIC + REFLEX CULTURE
Bilirubin Urine: NEGATIVE
GLUCOSE, UA: NEGATIVE
HGB URINE DIPSTICK: NEGATIVE
Hyaline Cast: NONE SEEN /LPF
KETONES UR: NEGATIVE
LEUKOCYTE ESTERASE: NEGATIVE
NITRITES URINE, INITIAL: POSITIVE — AB
PH: 6.5 (ref 5.0–8.0)
Protein, ur: NEGATIVE
RBC / HPF: NONE SEEN /HPF (ref 0–2)
SPECIFIC GRAVITY, URINE: 1.006 (ref 1.001–1.03)
Squamous Epithelial / LPF: NONE SEEN /HPF (ref <=5)
WBC UA: NONE SEEN /HPF (ref 0–5)

## 2016-11-16 LAB — NO CULTURE INDICATED

## 2016-11-17 LAB — PAP IG W/ RFLX HPV ASCU

## 2016-11-21 DIAGNOSIS — D225 Melanocytic nevi of trunk: Secondary | ICD-10-CM | POA: Diagnosis not present

## 2016-11-21 DIAGNOSIS — L570 Actinic keratosis: Secondary | ICD-10-CM | POA: Diagnosis not present

## 2016-11-21 DIAGNOSIS — X32XXXD Exposure to sunlight, subsequent encounter: Secondary | ICD-10-CM | POA: Diagnosis not present

## 2016-11-21 DIAGNOSIS — Z1283 Encounter for screening for malignant neoplasm of skin: Secondary | ICD-10-CM | POA: Diagnosis not present

## 2016-11-23 ENCOUNTER — Telehealth: Payer: Self-pay | Admitting: *Deleted

## 2016-11-23 NOTE — Telephone Encounter (Signed)
-----   Message from Almyra Free sent at 11/23/2016  2:34 PM EST ----- Regarding: Precert still pending I just checked on this patient's precert again and it still pending. Insurance said they will make a decision by 11/17. I would not expect this to come through tomorrow. Will need to reschedule. Thank you, Amy

## 2016-11-23 NOTE — Telephone Encounter (Signed)
Patient aware sleep study has been canceled-pending insurance approval.   Aware someone will call to reschedule once insurance approval is received.     Sleep center aware and canceled study.

## 2016-11-24 ENCOUNTER — Encounter (HOSPITAL_BASED_OUTPATIENT_CLINIC_OR_DEPARTMENT_OTHER): Payer: PPO

## 2016-11-24 NOTE — Telephone Encounter (Signed)
Spoke with pt, HTA, a home CPAP titration study can be ordered with [facility] for the member. The requst for the facility based test will be closed.

## 2016-11-24 NOTE — Telephone Encounter (Signed)
New Message    Per Debbie @ [facility] patient doesn't meet the criteria for a facility based sleep study. Can give her home based sleep study... Requesting call back   Ref [phone]

## 2016-12-12 ENCOUNTER — Ambulatory Visit
Admission: RE | Admit: 2016-12-12 | Discharge: 2016-12-12 | Disposition: A | Discharge status: Home / Self Care | Payer: PPO | Source: Ambulatory Visit | Attending: Internal Medicine | Admitting: Internal Medicine

## 2016-12-12 ENCOUNTER — Ambulatory Visit: Payer: PPO

## 2016-12-12 DIAGNOSIS — Z1231 Encounter for screening mammogram for malignant neoplasm of breast: Secondary | ICD-10-CM

## 2016-12-14 ENCOUNTER — Other Ambulatory Visit: Payer: Self-pay | Admitting: *Deleted

## 2016-12-14 DIAGNOSIS — R0681 Apnea, not elsewhere classified: Secondary | ICD-10-CM

## 2016-12-14 DIAGNOSIS — R0683 Snoring: Secondary | ICD-10-CM

## 2016-12-14 DIAGNOSIS — I480 Paroxysmal atrial fibrillation: Secondary | ICD-10-CM

## 2016-12-14 DIAGNOSIS — I10 Essential (primary) hypertension: Secondary | ICD-10-CM

## 2016-12-19 ENCOUNTER — Other Ambulatory Visit: Payer: Self-pay | Admitting: *Deleted

## 2016-12-19 DIAGNOSIS — Z0000 Encounter for general adult medical examination without abnormal findings: Secondary | ICD-10-CM

## 2016-12-19 DIAGNOSIS — Z720 Tobacco use: Secondary | ICD-10-CM

## 2016-12-21 ENCOUNTER — Ambulatory Visit: Payer: PPO | Admitting: Internal Medicine

## 2016-12-21 ENCOUNTER — Encounter: Payer: Self-pay | Admitting: Internal Medicine

## 2016-12-21 VITALS — BP 118/68 | HR 63 | Temp 98.2°F | Ht 62.0 in | Wt 146.0 lb

## 2016-12-21 DIAGNOSIS — M79605 Pain in left leg: Secondary | ICD-10-CM | POA: Diagnosis not present

## 2016-12-21 MED ORDER — MELOXICAM 15 MG PO TABS: 15.0000 mg | ORAL_TABLET | Freq: Every day | ORAL | 0 refills | Status: DC

## 2016-12-21 NOTE — Patient Instructions (Addendum)
Stretching exercises for your lower extremities 3 times daily especially prior to going to sleep at night  Meloxicam daily  Call or return to clinic prn if these symptoms worsen or fail to improve as anticipated.  You  may move around, but avoid painful motions and activities.

## 2016-12-21 NOTE — Progress Notes (Signed)
Subjective:    Patient ID: Haley [name], female    DOB: 11-Aug-1945, 71 y.o.   MRN: [id number]  HPI  71 year old patient who presents with a 2-week history of left leg discomfort.  She states that she does well throughout the day and this is mainly a nocturnal problem.  She describes a tightness involving the left lateral leg from the area just proximal to the ankle up to the mid calf area.  She states that she is unable to sleep well due to the discomfort.  No symptoms throughout the day with activities.  Past Medical History:  Diagnosis Date  . Allergy    SEASONAL  . Cataract    BILATERAL  . Fibromyalgia   . GERD (gastroesophageal reflux disease)   . H/O echocardiogram 04/28/08   EF>55% trace mitral regurgitation, No significant valvular pathology  . Hemorrhoids    internal  . History of stress test 02/08/2011   Normal Myocardial perfusion study, this is a low risk scan, No prior study available for comparison  . Hyperlipidemia   . MVP (mitral valve prolapse)    mild and MR  . OA (osteoarthritis)   . Osteoporosis   . Paroxysmal A-fib ([place])   . Thyroid disease   . Varicosities of leg   . Vitamin D deficiency      Social History   Socioeconomic History  . Marital status: Married    Spouse name: Not on file  . Number of children: Not on file  . Years of education: Not on file  . Highest education level: Not on file  Social Needs  . Financial resource strain: Not on file  . Food insecurity - worry: Not on file  . Food insecurity - inability: Not on file  . Transportation needs - medical: Not on file  . Transportation needs - non-medical: Not on file  Occupational History  . Not on file  Tobacco Use  . Smoking status: Current Every Day Smoker    Types: Cigars  . Smokeless tobacco: Never Used  . Tobacco comment: peach flavor   Substance and Sexual Activity  . Alcohol use: No    Alcohol/week: 0.0 oz  . Drug use: No  . Sexual activity: Yes    Comment: 1st  intercourse 17yo-1 partner  Other Topics Concern  . Not on file  Social History Narrative  . Not on file    Past Surgical History:  Procedure Laterality Date  . ABDOMINAL HYSTERECTOMY  1989  . APPENDECTOMY  1989   with hysterectomy  . BREAST EXCISIONAL BIOPSY Left 1995   Benign  . COLONOSCOPY  11-20-2000  . NOSE SURGERY  1990    Family History  Problem Relation Age of Onset  . Diabetes Daughter   . Diabetes Mother   . Hypertension Mother   . Heart disease Mother   . Stroke Maternal Grandmother   . Colon cancer Neg Hx     Allergies  Allergen Reactions  . Tramadol Hcl     REACTION: jittery    Current Outpatient Medications on File Prior to Visit  Medication Sig Dispense Refill  . ALPRAZolam (XANAX) 0.5 MG tablet TAKE ONE TABLET BY MOUTH ONCE DAILY AS NEEDED 90 tablet 3  . aspirin 81 MG tablet Take 81 mg by mouth daily.      . Calcium Carbonate (CALTRATE 600) 1500 MG TABS Take by mouth.      . cholecalciferol (VITAMIN D) 1000 UNITS tablet Take 1,000 Units by mouth daily.     Marland Kitchen  clobetasol cream (TEMOVATE) 0.05 % Apply twice a week first week then once weekly as needed 30 g 2  . fluticasone (CUTIVATE) 0.05 % cream Apply 1 application topically 2 (two) times daily.     . fluticasone (FLONASE) 50 MCG/ACT nasal spray Place 2 sprays into both nostrils daily. 48 g 3  . ibuprofen (ADVIL,MOTRIN) 800 MG tablet Take 1 tablet (800 mg total) by mouth every 6 (six) hours as needed. 60 tablet 0  . levothyroxine (SYNTHROID, LEVOTHROID) 75 MCG tablet Take 1 tablet (75 mcg total) by mouth daily. 90 tablet 3  . losartan (COZAAR) 25 MG tablet Take 1 tablet (25 mg total) by mouth daily. 30 tablet 0  . metoprolol succinate (TOPROL-XL) 25 MG 24 hr tablet Take 1 tablet (25 mg total) by mouth daily. 90 tablet 3  . Multiple Vitamins-Minerals (MULTIVITAMIN WITH MINERALS) tablet Take 1 tablet by mouth daily.      . Omega-3 Fatty Acids (FISH OIL) 1000 MG CAPS Take 2 capsules by mouth 2 (two) times  daily.    . simvastatin (ZOCOR) 20 MG tablet Take 1 tablet (20 mg total) by mouth at bedtime. 90 tablet 3  . tretinoin (RETIN-A) 0.025 % cream Apply topically at bedtime.     . vitamin C (ASCORBIC ACID) 500 MG tablet Take 500 mg by mouth daily.       No current facility-administered medications on file prior to visit.     BP 118/68 (BP Location: Left Arm, Patient Position: Sitting, Cuff Size: Normal)   Pulse 63   Temp 98.2 F (36.8 C) (Oral)   Ht 5\' 2"  (1.575 m)   Wt 146 lb (66.2 kg)   SpO2 98%   BMI 26.70 kg/m     Review of Systems  Constitutional: Negative.   HENT: Negative for congestion, dental problem, hearing loss, rhinorrhea, sinus pressure, sore throat and tinnitus.   Eyes: Negative for pain, discharge and visual disturbance.  Respiratory: Negative for cough and shortness of breath.   Cardiovascular: Negative for chest pain, palpitations and leg swelling.  Gastrointestinal: Negative for abdominal distention, abdominal pain, blood in stool, constipation, diarrhea, nausea and vomiting.  Genitourinary: Negative for difficulty urinating, dysuria, flank pain, frequency, hematuria, pelvic pain, urgency, vaginal bleeding, vaginal discharge and vaginal pain.  Musculoskeletal: Negative for arthralgias, gait problem and joint swelling.       Left lateral leg discomfort  Skin: Negative for rash.  Neurological: Negative for dizziness, syncope, speech difficulty, weakness, numbness and headaches.  Hematological: Negative for adenopathy.  Psychiatric/Behavioral: Negative for agitation, behavioral problems and dysphoric mood. The patient is not nervous/anxious.        Objective:   Physical Exam  Constitutional: She appears well-developed and well-nourished. No distress.  Musculoskeletal:  Negative straight leg test Full range of motion without pain involving the hips and knees bilaterally No local tenderness or swelling involving the legs  Vascular exam revealed diminished left  dorsalis pedis pulse          Assessment & Plan:   Left leg pain unclear etiology.  Sounds more muscular ligamentous Decreased left dorsalis pedis pulse.  Discomfort does not sound vascular  We treat with anti-inflammatories and stretching and observe at this point  [facility]

## 2017-01-20 ENCOUNTER — Ambulatory Visit (HOSPITAL_BASED_OUTPATIENT_CLINIC_OR_DEPARTMENT_OTHER): Payer: PPO | Attending: Cardiovascular Disease | Admitting: Cardiovascular Disease

## 2017-01-20 DIAGNOSIS — G4733 Obstructive sleep apnea (adult) (pediatric): Secondary | ICD-10-CM | POA: Insufficient documentation

## 2017-01-20 DIAGNOSIS — I4891 Unspecified atrial fibrillation: Secondary | ICD-10-CM | POA: Insufficient documentation

## 2017-01-20 DIAGNOSIS — I10 Essential (primary) hypertension: Secondary | ICD-10-CM | POA: Insufficient documentation

## 2017-01-20 DIAGNOSIS — I480 Paroxysmal atrial fibrillation: Secondary | ICD-10-CM

## 2017-01-20 DIAGNOSIS — R0681 Apnea, not elsewhere classified: Secondary | ICD-10-CM

## 2017-01-20 DIAGNOSIS — R0683 Snoring: Secondary | ICD-10-CM | POA: Insufficient documentation

## 2017-01-20 DIAGNOSIS — G4730 Sleep apnea, unspecified: Secondary | ICD-10-CM | POA: Diagnosis present

## 2017-01-30 ENCOUNTER — Other Ambulatory Visit: Payer: Self-pay | Admitting: Internal Medicine

## 2017-01-30 ENCOUNTER — Telehealth: Payer: Self-pay | Admitting: Cardiovascular Disease

## 2017-01-30 ENCOUNTER — Telehealth: Payer: Self-pay | Admitting: Internal Medicine

## 2017-01-30 ENCOUNTER — Other Ambulatory Visit: Payer: Self-pay | Admitting: *Deleted

## 2017-01-30 MED ORDER — LEVOTHYROXINE SODIUM 75 MCG PO TABS: 75.0000 ug | ORAL_TABLET | Freq: Every day | ORAL | 3 refills | Status: DC

## 2017-01-30 MED ORDER — ALPRAZOLAM 0.5 MG PO TABS: ORAL_TABLET | ORAL | 3 refills | Status: DC

## 2017-01-30 NOTE — Telephone Encounter (Signed)
error 

## 2017-01-30 NOTE — Telephone Encounter (Signed)
° °*  STAT* If patient is at the pharmacy, call can be transferred to refill team.   1. Which medications need to be refilled? (please list name of each medication and dose if known) metoprolol succinate (TOPROL-XL) 25 MG 24 hr tablet simvastatin (ZOCOR) 20 MG tablet  2. Which pharmacy/location (including street and city if local pharmacy) is medication to be sent to? Mail order [facility] pharmacy  3. Do they need a 30 day or 90 day supply? [place]

## 2017-01-30 NOTE — Telephone Encounter (Signed)
Copied from [place] #[id number]. Topic: Quick Communication - Rx Refill/Question >> Jan 30, 2017  1:35 PM Ahmed Prima L wrote: Medication: ALPRAZolam Duanne Moron) 0.5 MG tablet & levothyroxine (SYNTHROID, LEVOTHROID) 75 MCG tablet  Has the patient contacted their pharmacy? yes   (Agent: If no, request that the patient contact the pharmacy for the refill.)   Preferred Pharmacy (with phone number or street name):  [facility] [place], [place]  Agent: Please be advised that RX refills may take up to 3 business days. We ask that you follow-up with your pharmacy.

## 2017-01-30 NOTE — Telephone Encounter (Signed)
Rx request for Alprazolam- sent for provider review.  Rx for Levothyroxine refilled per protocol.

## 2017-01-31 MED ORDER — METOPROLOL SUCCINATE ER 25 MG PO TB24: 25.0000 mg | ORAL_TABLET | Freq: Every day | ORAL | 1 refills | Status: DC

## 2017-01-31 MED ORDER — SIMVASTATIN 20 MG PO TABS: 20.0000 mg | ORAL_TABLET | Freq: Every day | ORAL | 1 refills | Status: DC

## 2017-01-31 NOTE — Telephone Encounter (Signed)
Xanax Rx was faxed to the pharmacy.

## 2017-02-07 ENCOUNTER — Encounter (HOSPITAL_BASED_OUTPATIENT_CLINIC_OR_DEPARTMENT_OTHER): Payer: Self-pay | Admitting: Cardiovascular Disease

## 2017-02-07 NOTE — Procedures (Signed)
Patient Name: Haley [name], Haley [name] Date: 01/22/2017 Gender: Female D.O.B: 1945/06/03 Age (years): 71 Referring Provider: Shelva Majestic MD, ABSM Height (inches): 62 Interpreting Physician: Shelva Majestic MD, ABSM Weight (lbs): 143 RPSGT: Jacolyn Reedy BMI: 26 MRN: [id number] Neck Size: 13.00  CLINICAL INFORMATION Sleep Study Type: HST  Indication for sleep study: Asthma, Hypertension, Snoring, Witnessed Apneas  Epworth Sleepiness Score: 12  SLEEP STUDY TECHNIQUE A multi-channel overnight portable sleep study was performed. The channels recorded were: nasal airflow, thoracic respiratory movement, and oxygen saturation with a pulse oximetry. Snoring was also monitored.  MEDICATIONS     ALPRAZolam (XANAX) 0.5 MG tablet             aspirin 81 MG tablet         Calcium Carbonate (CALTRATE 600) 1500 MG TABS         cholecalciferol (VITAMIN D) 1000 UNITS tablet         clobetasol cream (TEMOVATE) 0.05 %         fluticasone (CUTIVATE) 0.05 % cream         fluticasone (FLONASE) 50 MCG/ACT nasal spray         ibuprofen (ADVIL,MOTRIN) 800 MG tablet         levothyroxine (SYNTHROID, LEVOTHROID) 75 MCG tablet         losartan (COZAAR) 25 MG tablet (Expired)         meloxicam (MOBIC) 15 MG tablet         metoprolol succinate (TOPROL-XL) 25 MG 24 hr tablet         Multiple Vitamins-Minerals (MULTIVITAMIN WITH MINERALS) tablet         Omega-3 Fatty Acids (FISH OIL) 1000 MG CAPS         simvastatin (ZOCOR) 20 MG tablet         tretinoin (RETIN-A) 0.025 % cream         vitamin C (ASCORBIC ACID) 500 MG tablet      Patient self administered medications include: N/A.  SLEEP ARCHITECTURE Patient was studied for 412.3 minutes. The sleep efficiency was 100.0 % and the patient was supine for 46.4%. The arousal index was 0.0 per hour.  RESPIRATORY PARAMETERS The overall AHI was 14.7 per hour, with a central apnea index of 0.0 per hour. There is a positional component with supine  sleep, AHI 20.7/h vs non-supine sleep AHI 9.5/h.  The oxygen nadir was 82% during sleep. Time spent less than 89% was 4.1 minutes.  CARDIAC DATA Mean heart rate during sleep was 56.9 bpm.  The slowest heart rate was sinus bradycardia at 46 bpm.  IMPRESSIONS - Mild/Moderate obstructive sleep apnea occurred during this study (AHI 14.7/h). - No significant central sleep apnea occurred during this study (CAI = 0.0/h). - Moderate oxygen desaturation to a nadir of 82%. - Patient snored 3.4% during the sleep.  DIAGNOSIS - Obstructive Sleep Apnea (327.23 [G47.33 ICD-10])  RECOMMENDATIONS - In this patient with cardiovascular comorbidities, recommend therapeutic CPAP titration to determine optimal pressure required to alleviate sleep disordered breathing. - Effort should be made to optimize nasal and oropharyngeal patency. - The patient should be counseled to try to avoid supine sleep; consider positional therapy avoiding supine position during sleep. - Avoid alcohol, sedatives and other CNS depressants that may worsen sleep apnea and disrupt normal sleep architecture. - Sleep hygiene should be reviewed to assess factors that may improve sleep quality. - Weight management and regular exercise should be initiated or continued.   [Electronically signed]  02/07/2017 04:02 PM  Shelva Majestic MD, [facility], ABSM Diplomate, American Board of Sleep Medicine   NPI: [id number]  [place] PH: [phone]   FX: [phone] [place]

## 2017-02-17 ENCOUNTER — Telehealth: Payer: Self-pay | Admitting: *Deleted

## 2017-02-17 ENCOUNTER — Ambulatory Visit: Payer: PPO | Admitting: Cardiovascular Disease

## 2017-02-17 ENCOUNTER — Telehealth: Payer: Self-pay | Admitting: Cardiovascular Disease

## 2017-02-17 ENCOUNTER — Encounter: Payer: Self-pay | Admitting: Cardiovascular Disease

## 2017-02-17 VITALS — BP 128/70 | HR 54 | Ht 62.0 in | Wt 144.2 lb

## 2017-02-17 DIAGNOSIS — E785 Hyperlipidemia, unspecified: Secondary | ICD-10-CM | POA: Diagnosis not present

## 2017-02-17 DIAGNOSIS — I480 Paroxysmal atrial fibrillation: Secondary | ICD-10-CM

## 2017-02-17 DIAGNOSIS — E039 Hypothyroidism, unspecified: Secondary | ICD-10-CM | POA: Diagnosis not present

## 2017-02-17 DIAGNOSIS — I10 Essential (primary) hypertension: Secondary | ICD-10-CM

## 2017-02-17 DIAGNOSIS — G4733 Obstructive sleep apnea (adult) (pediatric): Secondary | ICD-10-CM

## 2017-02-17 NOTE — Progress Notes (Signed)
Patient ID: Haley [name], female   DOB: [birth year], 72 y.o.   MRN: [id number]    PCP: Dr. Bluford Kaufmann  HPI: Haley [name] is a 72 y.o. female presents to the office today for a 4 month followup evaluation.  Haley [name]  has a history of palpitations, paroxysmal atrial fibrillation, hyperlipidemia, GERD, and hypothyroidism. She has been found to have an atherogenic dyslipidemia lipid panel. She also has a history of lower extremity varicosities and wears support stockings. Remotely she was having difficulty remembering to take her metoprolol on a twice a day regimen at that time I switched her to metoprolol succinate Toprol-XL 25 mg daily. She has felt markedly improved with reference to taking this once a day preparation. She is unaware of any breakthrough tachycardia palpitations. She denies presyncope or syncope. An NMR lipoprofile  showed marked improvement with a cholesterol of 174, triglycerides 119, HDL C. cholesterol 60, and LDL cholesterol at 90. Her LDL particle number was 1020 and her HDL particle number was 39.5. Her insulin resistance score was 39 which was normal.   Laboratory last year showed a total cholesterol 165, triglycerides 100, HDL 54, LDL 91.  TSH was 2.03.  She was not anemic.  She normal chemistry profile.  Since I last saw her, she denies any episodes of chest pain or significant palpitations.  She admits to occasional leg cramps.  She also at times note some very mild left ankle swelling.  She has had issues with bladder incontinence and sees Dr. Toney Rakes. She has been taking Toprol 25 mg daily.  She is tolerated simvastatin 20 mg for hyperlipidemia.  She is on 75 g of levothyroxine for hypothyroidism.  She also has osteopenia for which she takes Fosamax.    When I last saw her, she denied any episodes of chest pain.  .  She had occasional leg cramps.  She has had issues with bladder incontinence for which she had seen Dr. Toney Rakes in the past.  She admits to  intermittent right leg discomfort.  She tells me that her husband has witnessed apneic spells at night.  She has a history of snoring.  .  She has a history of hypothyroidism on Synthroid replacement at 75 g.  She has been taking simvastatin 20 g hyperlipidemia.  She underwent a home sleep study for evaluation of sleep apnea which was done on 01/22/2017.  She was found to have moderate sleep apnea overall with an AHI of 14.7/h.  There was a significant positional component with supine sleep HI 20.7 versus nonsupine sleep HI.  89.5 per hour.  Since this was a home study, no data is available regarding AHI during REM sleep.  She had oxygen desaturation to a nadir of 82%.  Past Medical History:  Diagnosis Date  . Allergy    SEASONAL  . Cataract    BILATERAL  . Fibromyalgia   . GERD (gastroesophageal reflux disease)   . H/O echocardiogram 04/28/08   EF>55% trace mitral regurgitation, No significant valvular pathology  . Hemorrhoids    internal  . History of stress test 02/08/2011   Normal Myocardial perfusion study, this is a low risk scan, No prior study available for comparison  . Hyperlipidemia   . MVP (mitral valve prolapse)    mild and MR  . OA (osteoarthritis)   . Osteoporosis   . Paroxysmal A-fib ([place])   . Thyroid disease   . Varicosities of leg   . Vitamin D deficiency  Past Surgical History:  Procedure Laterality Date  . ABDOMINAL HYSTERECTOMY  1989  . APPENDECTOMY  1989   with hysterectomy  . BREAST EXCISIONAL BIOPSY Left 1995   Benign  . COLONOSCOPY  11-20-2000  . NOSE SURGERY  1990    Allergies  Allergen Reactions  . Tramadol Hcl     REACTION: jittery    Current Outpatient Medications  Medication Sig Dispense Refill  . ALPRAZolam (XANAX) 0.5 MG tablet TAKE ONE TABLET BY MOUTH ONCE DAILY AS NEEDED 90 tablet 3  . aspirin 81 MG tablet Take 81 mg by mouth daily.      . Calcium Carbonate (CALTRATE 600) 1500 MG TABS Take by mouth.      . cholecalciferol  (VITAMIN D) 1000 UNITS tablet Take 1,000 Units by mouth daily.     . clobetasol cream (TEMOVATE) 0.05 % Apply twice a week first week then once weekly as needed 30 g 2  . fluticasone (CUTIVATE) 0.05 % cream Apply 1 application topically 2 (two) times daily.     . fluticasone (FLONASE) 50 MCG/ACT nasal spray Place 2 sprays into both nostrils daily. 48 g 3  . ibuprofen (ADVIL,MOTRIN) 800 MG tablet Take 1 tablet (800 mg total) by mouth every 6 (six) hours as needed. 60 tablet 0  . levothyroxine (SYNTHROID, LEVOTHROID) 75 MCG tablet Take 1 tablet (75 mcg total) by mouth daily. 90 tablet 3  . losartan (COZAAR) 25 MG tablet Take 25 mg by mouth daily.    . meloxicam (MOBIC) 15 MG tablet Take 1 tablet (15 mg total) by mouth daily. 30 tablet 0  . metoprolol succinate (TOPROL-XL) 25 MG 24 hr tablet Take 1 tablet (25 mg total) by mouth daily. 90 tablet 1  . Multiple Vitamins-Minerals (MULTIVITAMIN WITH MINERALS) tablet Take 1 tablet by mouth daily.      . Omega-3 Fatty Acids (FISH OIL) 1000 MG CAPS Take 2 capsules by mouth 2 (two) times daily.    . simvastatin (ZOCOR) 20 MG tablet Take 1 tablet (20 mg total) by mouth at bedtime. 90 tablet 1  . tretinoin (RETIN-A) 0.025 % cream Apply topically at bedtime.     . vitamin C (ASCORBIC ACID) 500 MG tablet Take 500 mg by mouth daily.       No current facility-administered medications for this visit.     Socially she is married and has 2 children and 4 grandchildren with one great grandchild. She smokes little cigars. [facility].  ROS General: Negative; No fevers, chills, or night sweats;  HEENT: Negative; No changes in vision or hearing, sinus congestion, difficulty swallowing Pulmonary: Negative; No cough, wheezing, shortness of breath, hemoptysis Cardiovascular: Negative; No chest pain, presyncope, syncope, palpitations; occasional left ankle swelling GI: Negative; No nausea, vomiting, diarrhea, or  abdominal pain GU: Negative; No dysuria, hematuria, or difficulty voiding Musculoskeletal:Positive for right leg discomfort; no myalgias, joint pain, or weakness Hematologic/Oncology: Negative; no easy bruising, bleeding Endocrine: Positive for hypothyroidism, on Synthroid e; no diabetes Neuro: Negative; no changes in balance, headaches Skin: Negative; No rashes or skin lesions Psychiatric: Negative; No behavioral problems, depression Sleep: Positive for witnessed apnea and snoring.  She denies  daytime sleepiness, hypersomnolence, bruxism, restless legs, hypnogognic hallucinations, no cataplexy Other comprehensive 14 point system review is negative.   PE BP 128/70   Pulse (!) 54   Ht '5\' 2"'$  (1.575 m)   Wt 144 lb 3.2 oz (65.4 kg)   BMI 26.37 kg/m  Repeat blood pressure by me was 126/70  Wt Readings from Last 3 Encounters:  02/17/17 144 lb 3.2 oz (65.4 kg)  12/21/16 146 lb (66.2 kg)  11/15/16 144 lb (65.3 kg)   General: Alert, oriented, no distress.  Skin: normal turgor, no rashes, warm and dry HEENT: Normocephalic, atraumatic. Pupils equal round and reactive to light; sclera anicteric; extraocular muscles intact;  Nose without nasal septal hypertrophy Mouth/Parynx benign; Mallinpatti scale 3 Neck: No JVD, no carotid bruits; normal carotid upstroke Lungs: clear to ausculatation and percussion; no wheezing or rales Chest wall: without tenderness to palpitation Heart: PMI not displaced, RRR, s1 s2 normal, 1/6 systolic murmur, no diastolic murmur, no rubs, gallops, thrills, or heaves Abdomen: soft, nontender; no hepatosplenomehaly, BS+; abdominal aorta nontender and not dilated by palpation. Back: no CVA tenderness Pulses 2+ Musculoskeletal: full range of motion, normal strength, no joint deformities Extremities: Resolution of prior mild ankle edema no clubbing ,cyanosis , Homan's sign negative  Neurologic: grossly nonfocal; Cranial nerves grossly wnl Psychologic: Normal mood  and affect   ECG (independently read by me): Sinus bradycardia 54 bpm.  QS complex V1 V2.  Normal intervals  October 2018 ECG (independently read by me): Sinus bradycardia 56 bpm.  QS V1, V2.  Normal intervals.  November 2017 ECG (independently read by me): Sinus bradycardia 52 bpm.  QS complex precordially.  November 2016 ECG (independently read by me): Sinus bradycardia 52 bpm.  Normal intervals.  No ST segment changes.  April 2016 ECG (independently read by me): Sinus bradycardia at 54 bpm.  Normal intervals.  No significant ST segment changes  September 2015 ECG (independently read by me): Sinus bradycardia 52 beats per minute.  QS complex in V1 and V2.  Nonspecific ST change.  QTc interval 398 ms  Prior March 2015 ECG (independently read by me): Sinus bradycardia 51 beats per minute. No ectopy. Normal intervals.  Prior 09/14/2012 ECG: Sinus rhythm at 53 beats per minute. She has poor anterior R-wave progression. Normal intervals  LABS:  BMP Latest Ref Rng & Units 10/31/2016 11/17/2015 04/25/2014  Glucose 70 - 99 mg/dL 89 84 86  BUN 6 - 23 mg/dL '16 9 11  '$ Creatinine 0.40 - 1.20 mg/dL 0.68 0.76 0.66  Sodium 135 - 145 mEq/L 143 140 139  Potassium 3.5 - 5.1 mEq/L 4.0 4.1 3.7  Chloride 96 - 112 mEq/L 106 106 104  CO2 19 - 32 mEq/L '30 28 27  '$ Calcium 8.4 - 10.5 mg/dL 9.3 9.3 9.0    Hepatic Function Latest Ref Rng & Units 10/31/2016 11/17/2015 04/25/2014  Total Protein 6.0 - 8.3 g/dL 6.5 6.5 6.5  Albumin 3.5 - 5.2 g/dL 4.0 3.8 3.7  AST 0 - 37 U/L '12 13 22  '$ ALT 0 - 35 U/L '11 8 20  '$ Alk Phosphatase 39 - 117 U/L 64 67 75  Total Bilirubin 0.2 - 1.2 mg/dL 0.4 0.4 0.3  Bilirubin, Direct 0.0 - 0.3 mg/dL - - -    CBC Latest Ref Rng & Units 10/31/2016 11/17/2015 10/24/2014  WBC 4.0 - 10.5 K/uL 9.0 8.7 9.8  Hemoglobin 12.0 - 15.0 g/dL 13.8 13.4 13.7  Hematocrit 36.0 - 46.0 % 41.5 41.3 41.0  Platelets 150.0 - 400.0 K/uL 219.0 222 227.0   Lab Results  Component Value Date   MCV 91.0  10/31/2016   MCV 91.2 11/17/2015   MCV 89.9 10/24/2014    Lab Results  Component Value Date   TSH 2.09 10/31/2016   Lab Results  Component Value  Date   HGBA1C 6.0 08/06/2012   BNP No results found for: PROBNP  Lipid Panel     Component Value Date/Time   CHOL 168 10/31/2016 0912   TRIG 135.0 10/31/2016 0912   HDL 56.00 10/31/2016 0912   CHOLHDL 3 10/31/2016 0912   VLDL 27.0 10/31/2016 0912   LDLCALC 85 10/31/2016 0912     RADIOLOGY: No results found.   IMPRESSION:  1. PAF (paroxysmal atrial fibrillation) ([place])   2. Essential hypertension   3. OSA (obstructive sleep apnea)   4. Dyslipidemia   5. Hypothyroidism, unspecified type     ASSESSMENT AND PLAN: Haley [name] is a 72 year old female who has a history of palpitations that have improved with once a day long-acting beta blocker therapy.  She has not had any recurrent atrial fibrillation and  is maintaining sinus rhythm.  She is bradycardic and remains asymptomati and specifically denies any dizziness or orthostatic symptoms.  She has been demonstrated to have normal systolic and diastolic function on an echo Doppler study in 2013.  There was mild aortic sclerosis without stenosis. A nuclear perfusion study  in January 2013 showed normal perfusion without scar or ischemia and she had a post stress ejection fraction of 75%.  Over last several office visits.  Her blood pressure was elevated and when I last saw her, I added losartan to her medical regimen.  She is now on losartan 25 mg daily in addition to Toprol-XL 25 mg.  Blood pressure today is excellent.  She is not having any palpitations on her current dose of metoprolol.  She has a history of hypothyroidism and is on levothyroxine at 75 g.  Viewed her home sleep study in detail.  This is consistent with moderate sleep apnea with an overall AHI of 14.7/h and with more significant sleep apnea with supine posture with an HI of 20.7.  Discussed with her the importance of  trying to reduce supine sleep and sleep on her side.  With her cardiovascular comorbidities, I'm scheduling her for CPAP titration study for optimal treatment of her sleep apnea.  During her home study, her oxygen desaturated to 82%.  Following initiation of CPAP therapy, I will see her in the office for follow-up evaluation.  She will continue her simvastatin for hyperlipidemia.  Time spent: 25 minutes Troy Sine, MD, [facility]  02/17/2017 1:28 PM

## 2017-02-17 NOTE — Telephone Encounter (Signed)
x

## 2017-02-17 NOTE — Telephone Encounter (Signed)
CPAP titration was ordered today at office visit  Attempted to call [facility] from [facility] - phone rang, no answer.  I am unsure where the referral for the sleep study is sent.  Routed to pre-cert and admin for follow up

## 2017-02-17 NOTE — Telephone Encounter (Signed)
New Message    Haley [name] is calling from [facility] in reference to where the referral is sent for sleep study. Please call to discuss.

## 2017-02-17 NOTE — Patient Instructions (Signed)
Medication Instructions:  Your physician recommends that you continue on your current medications as directed. Please refer to the Current Medication list given to you today.  Testing/Procedures: CPAP titration study --this must be pre-approved by insurance prior to scheduling.  Once approved, someone from our office will call to schedule.   Follow-Up: 3-4 months with Dr. Claiborne Billings.   Any Other Special Instructions Will Be Listed Below (If Applicable).     If you need a refill on your cardiac medications before your next appointment, please call your pharmacy.

## 2017-02-17 NOTE — Progress Notes (Signed)
Patient Name: Haley [name], Dicker Date: 01/22/2017 Gender: Female D.O.B: 25-May-1945 Age (years): 71 Referring Provider: Shelva Majestic MD, ABSM Height (inches): 62 Interpreting Physician: Shelva Majestic MD, ABSM Weight (lbs): 143 RPSGT: Jacolyn Reedy BMI: 26 MRN: [id number] Neck Size: 13.00  CLINICAL INFORMATION Sleep Study Type: HST  Indication for sleep study: Asthma, Hypertension, Snoring, Witnessed Apneas  Epworth Sleepiness Score: 12  SLEEP STUDY TECHNIQUE A multi-channel overnight portable sleep study was performed. The channels recorded were: nasal airflow, thoracic respiratory movement, and oxygen saturation with a pulse oximetry. Snoring was also monitored.  MEDICATIONS     ALPRAZolam (XANAX) 0.5 MG tablet             aspirin 81 MG tablet         Calcium Carbonate (CALTRATE 600) 1500 MG TABS         cholecalciferol (VITAMIN D) 1000 UNITS tablet         clobetasol cream (TEMOVATE) 0.05 %         fluticasone (CUTIVATE) 0.05 % cream         fluticasone (FLONASE) 50 MCG/ACT nasal spray         ibuprofen (ADVIL,MOTRIN) 800 MG tablet         levothyroxine (SYNTHROID, LEVOTHROID) 75 MCG tablet         losartan (COZAAR) 25 MG tablet (Expired)         meloxicam (MOBIC) 15 MG tablet         metoprolol succinate (TOPROL-XL) 25 MG 24 hr tablet         Multiple Vitamins-Minerals (MULTIVITAMIN WITH MINERALS) tablet         Omega-3 Fatty Acids (FISH OIL) 1000 MG CAPS         simvastatin (ZOCOR) 20 MG tablet         tretinoin (RETIN-A) 0.025 % cream         vitamin C (ASCORBIC ACID) 500 MG tablet      Patient self administered medications include: N/A.  SLEEP ARCHITECTURE Patient was studied for 412.3 minutes. The sleep efficiency was 100.0 % and the patient was supine for 46.4%. The arousal index was 0.0 per hour.  RESPIRATORY PARAMETERS The overall AHI was 14.7 per hour, with a central apnea index of 0.0 per hour. There is a positional component with supine  sleep, AHI 20.7/h vs non-supine sleep AHI 9.5/h.  The oxygen nadir was 82% during sleep. Time spent less than 89% was 4.1 minutes.  CARDIAC DATA Mean heart rate during sleep was 56.9 bpm.  The slowest heart rate was sinus bradycardia at 46 bpm.  IMPRESSIONS - Mild/Moderate obstructive sleep apnea occurred during this study (AHI 14.7/h). - No significant central sleep apnea occurred during this study (CAI = 0.0/h). - Moderate oxygen desaturation to a nadir of 82%. - Patient snored 3.4% during the sleep.  DIAGNOSIS - Obstructive Sleep Apnea (327.23 [G47.33 ICD-10])  RECOMMENDATIONS - In this patient with cardiovascular comorbidities, recommend therapeutic CPAP titration to determine optimal pressure required to alleviate sleep disordered breathing. - Effort should be made to optimize nasal and oropharyngeal patency. - The patient should be counseled to try to avoid supine sleep; consider positional therapy avoiding supine position during sleep. - Avoid alcohol, sedatives and other CNS depressants that may worsen sleep apnea and disrupt normal sleep architecture. - Sleep hygiene should be reviewed to assess factors that may improve sleep quality. - Weight management and regular exercise should be initiated or continued.   [Electronically signed]  02/07/2017 04:02 PM  Shelva Majestic MD, [facility], ABSM Diplomate, American Board of Sleep Medicine   NPI: [id number]  [place] PH: [phone]   FX: [phone] [place]

## 2017-02-20 NOTE — Telephone Encounter (Signed)
Maybe Haley [name] knows

## 2017-02-21 ENCOUNTER — Telehealth: Payer: Self-pay | Admitting: *Deleted

## 2017-02-21 NOTE — Telephone Encounter (Signed)
-----   Message from [place] sent at 02/17/2017 10:20 AM EST ----- Regarding: CPAP titration   CPAP titration study

## 2017-02-22 ENCOUNTER — Telehealth: Payer: Self-pay | Admitting: Cardiovascular Disease

## 2017-02-22 NOTE — Telephone Encounter (Signed)
New message     Brayton Layman  from the [place] at [facility] [phone], calling to get additional information for sleep study.   Please call

## 2017-03-01 NOTE — Telephone Encounter (Signed)
Done a follow up call to [facility] with UR Department to inquire about what additional information she needs to complete PA for CPAP titration. She needed a NPI # for [facility]. NPI # provided. She then gave me a pending PA reference #. [id number].

## 2017-03-03 ENCOUNTER — Telehealth: Payer: Self-pay | Admitting: *Deleted

## 2017-03-06 NOTE — Telephone Encounter (Signed)
Patient returned a call to me and was notifed of her CPAP titration appointment scheduled for 3/7/119.

## 2017-03-16 ENCOUNTER — Ambulatory Visit (HOSPITAL_BASED_OUTPATIENT_CLINIC_OR_DEPARTMENT_OTHER): Payer: PPO | Attending: Cardiovascular Disease | Admitting: Cardiovascular Disease

## 2017-03-16 DIAGNOSIS — Z791 Long term (current) use of non-steroidal anti-inflammatories (NSAID): Secondary | ICD-10-CM | POA: Diagnosis not present

## 2017-03-16 DIAGNOSIS — Z79890 Hormone replacement therapy (postmenopausal): Secondary | ICD-10-CM | POA: Diagnosis not present

## 2017-03-16 DIAGNOSIS — I480 Paroxysmal atrial fibrillation: Secondary | ICD-10-CM

## 2017-03-16 DIAGNOSIS — G4733 Obstructive sleep apnea (adult) (pediatric): Secondary | ICD-10-CM | POA: Insufficient documentation

## 2017-03-16 DIAGNOSIS — Z7982 Long term (current) use of aspirin: Secondary | ICD-10-CM | POA: Insufficient documentation

## 2017-03-16 DIAGNOSIS — I10 Essential (primary) hypertension: Secondary | ICD-10-CM

## 2017-03-16 DIAGNOSIS — Z79899 Other long term (current) drug therapy: Secondary | ICD-10-CM | POA: Insufficient documentation

## 2017-03-20 ENCOUNTER — Telehealth: Payer: Self-pay | Admitting: Cardiovascular Disease

## 2017-03-20 ENCOUNTER — Ambulatory Visit: Payer: Self-pay

## 2017-03-20 NOTE — Telephone Encounter (Signed)
New Message:    Pt says she had a lot of complications with her Sleep Study she had on 03-16-17,She says she does nott feel well.

## 2017-03-20 NOTE — Telephone Encounter (Signed)
Returned a call to patient . She states that she was unable to complete her CPAP titration due to " not being able to breathe." she states the respiratory therapist increased the pressure up to 9 and she still could not breathe. She started to feel  "tight" in her chest. Although she feels somewhat better today, she feels something is still not right with her health. I asked patient if it was possible she had a anxiety attack from having the mask over her face. She says that she was not anxious. She just felt like she wasn't getting enough air. I expressed how sorry I was that she had this experience. I have never heard of any patient reporting any of these symptoms. She says that she researched the study on [employer] and feels she may have gotten some carbon monoxide poisoning. Recommended that she contact her PCP for evaluation. If after his evaluation  he feels that she needs to see Dr Claiborne Billings for cardiac evaluation please contact us back for appointment.

## 2017-03-20 NOTE — Telephone Encounter (Signed)
Message sent to sleep coordinator  

## 2017-03-20 NOTE — Telephone Encounter (Signed)
Pt. called to report shortness of breath and tightness across lower rib cage and upper abdomen.  Stated her symptoms started during a test on 03/16/17 for Sleep Apnea.  Reported she became short of breath and described chest tightness during the test, and was unable to complete it.  Reported she felt worse the next day on Friday and Saturday.  Stated "I really should have gone to the ER."  Questioned pt. about how she is feeling today?  Reported she feels "tense and tight across lower rib cage, and feels short of breath."  Denied any radiation of the pain.  Denied chest congestion, nausea, sweating.  Stated "I've had feelings like I'm giving out in the chest before."  Questioned what that means; reported "like I feel weak in my chest." Questioned about heart palpitations.  Reported she has heart palpitations on and off.  Denied feeling the heart palpitations at present.  Stated "my breathing is not what it should be."  Reported she called the Cardiology office, and was advised to call her PCP "to be checked for carbon monoxide poisoning."  Advised pt. w/ symptoms of chest tightness and SOB, she should go to the ER.  Stated "I don't feel that bad right now."  Discussed with pt. that the MD office is not equipped to evaluate chest pain.  Stated "I don't have chest pain; I have tightness and shortness of breath."  Denied SOB at rest.  Stated she is doing better than she was Thurs., Friday, and Saturday.  Reported BP 130/73, Pulse 51, and 02 Sat. 98%.  Appt. Given for 03/21/17 at PCP office.  Strongly advised to go to the ER if her symptoms worsen; ie: increased chest tightness, increased shortness of breath, sweating, nausea, or any other worsening symptoms.  Verb. Understanding. (45 min. Were spent in triaging the pt., to understand her symptoms.)            Reason for Disposition . [1] Intermittent chest pain from "angina" AND [2] NO increase in severity or frequency  Answer Assessment - Initial Assessment  Questions 1. LOCATION: "Where does it hurt?"       Across lower rib cage and upper abdomen; short of breath 2. RADIATION: "Does the pain go anywhere else?" (e.g., into neck, jaw, arms, back)    Denied radiation of pain to jaw, neck, shoulder, or arm    3. ONSET: "When did the chest pain begin?" (Minutes, hours or days)      Thursday 03/16/17 about 12 midnight, during sleep apnea test  4. PATTERN "Does the pain come and go, or has it been constant since it started?"  "Does it get worse with exertion?"      continous 5. DURATION: "How long does it last" (e.g., seconds, minutes, hours)     Stated "I have been feeling it since Thurs. night, Fri., and Saturday; I have been feeling short of breath and tight across my abd and chest, but not as bad as it was."   6. SEVERITY: "How bad is the pain?"  (e.g., Scale 1-10; mild, moderate, or severe)    - MILD (1-3): doesn't interfere with normal activities     - MODERATE (4-7): interferes with normal activities or awakens from sleep    - SEVERE (8-10): excruciating pain, unable to do any normal activities       7/10 7. CARDIAC RISK FACTORS: "Do you have any history of heart problems or risk factors for heart disease?" (e.g., prior heart attack, angina; high  blood pressure, diabetes, being overweight, high cholesterol, smoking, or strong family history of heart disease)     High blood pressure; irregular heart beat; had heart palpitations yesterday; feels it off and on.   8. PULMONARY RISK FACTORS: "Do you have any history of lung disease?"  (e.g., blood clots in lung, asthma, emphysema, birth control pills)     Denied the above 9. CAUSE: "What do you think is causing the chest pain?"     unknown 10. OTHER SYMPTOMS: "Do you have any other symptoms?" (e.g., dizziness, nausea, vomiting, sweating, fever, difficulty breathing, cough)       Denied any of the above 11. PREGNANCY: "Is there any chance you are pregnant?" "When was your last menstrual period?"       n/a  Protocols used: CHEST PAIN-A-AH

## 2017-03-20 NOTE — Telephone Encounter (Signed)
She may have needed increase CPAP pressure but unfortunately did not complete the study; doubt carbon monoxide toxicity

## 2017-03-21 ENCOUNTER — Ambulatory Visit (INDEPENDENT_AMBULATORY_CARE_PROVIDER_SITE_OTHER): Payer: PPO | Admitting: Family Medicine

## 2017-03-21 ENCOUNTER — Encounter: Payer: Self-pay | Admitting: Family Medicine

## 2017-03-21 VITALS — BP 124/66 | HR 57 | Wt 146.6 lb

## 2017-03-21 DIAGNOSIS — I480 Paroxysmal atrial fibrillation: Secondary | ICD-10-CM

## 2017-03-21 DIAGNOSIS — F419 Anxiety disorder, unspecified: Secondary | ICD-10-CM | POA: Diagnosis not present

## 2017-03-21 DIAGNOSIS — R0602 Shortness of breath: Secondary | ICD-10-CM

## 2017-03-21 LAB — CBC WITH DIFFERENTIAL/PLATELET
BASOS ABS: 0.1 10*3/uL (ref 0.0–0.1)
Basophils Relative: 1.1 % (ref 0.0–3.0)
EOS ABS: 0.8 10*3/uL — AB (ref 0.0–0.7)
Eosinophils Relative: 10 % — ABNORMAL HIGH (ref 0.0–5.0)
HCT: 40.2 % (ref 36.0–46.0)
HEMOGLOBIN: 13.5 g/dL (ref 12.0–15.0)
Lymphocytes Relative: 30.3 % (ref 12.0–46.0)
Lymphs Abs: 2.6 10*3/uL (ref 0.7–4.0)
MCHC: 33.7 g/dL (ref 30.0–36.0)
MCV: 91.1 fl (ref 78.0–100.0)
Monocytes Absolute: 0.5 10*3/uL (ref 0.1–1.0)
Monocytes Relative: 6.4 % (ref 3.0–12.0)
Neutro Abs: 4.4 10*3/uL (ref 1.4–7.7)
Neutrophils Relative %: 52.2 % (ref 43.0–77.0)
Platelets: 204 10*3/uL (ref 150.0–400.0)
RBC: 4.41 Mil/uL (ref 3.87–5.11)
RDW: 14 % (ref 11.5–15.5)
WBC: 8.4 10*3/uL (ref 4.0–10.5)

## 2017-03-21 LAB — BASIC METABOLIC PANEL
BUN: 12 mg/dL (ref 6–23)
CALCIUM: 9.5 mg/dL (ref 8.4–10.5)
CHLORIDE: 105 meq/L (ref 96–112)
CO2: 29 mEq/L (ref 19–32)
Creatinine, Ser: 0.68 mg/dL (ref 0.40–1.20)
GFR: 90.14 mL/min (ref >60.00)
Glucose, Bld: 86 mg/dL (ref 70–99)
Potassium: 4.2 mEq/L (ref 3.5–5.1)
Sodium: 140 mEq/L (ref 135–145)

## 2017-03-21 LAB — TSH: TSH: 0.59 u[IU]/mL (ref 0.35–4.50)

## 2017-03-21 NOTE — Progress Notes (Signed)
   Subjective:    Patient ID: Haley [name], female    DOB: 1945/06/22, 72 y.o.   MRN: [id number]  HPI Here to discuss SOB which started during a sleep apnea test on 03-16-17. During the test she says she felt very SOB though she denies any chest pain or pressure. The technician kept turning up the air flow pressure during the test but this did not help. Since then she has felt SOB at times but every day has gotten better. Still no chest pain. No coughing or fever. Today she feels normal. Her BP at home has been stable.    Review of Systems  Constitutional: Negative.   Respiratory: Positive for shortness of breath. Negative for cough, chest tightness and wheezing.   Cardiovascular: Negative.   Neurological: Negative.        Objective:   Physical Exam  Constitutional: She is oriented to person, place, and time. She appears well-developed and well-nourished. No distress.  Neck: No thyromegaly present.  Cardiovascular: Normal rate, regular rhythm, normal heart sounds and intact distal pulses.  Pulmonary/Chest: Breath sounds normal. No respiratory distress. She has no wheezes. She has no rales.  Musculoskeletal: She exhibits no edema.  Lymphadenopathy:    She has no cervical adenopathy.  Neurological: She is alert and oriented to person, place, and time.          Assessment & Plan:  SOB of unclear etiology. She seems stable today. We will get some lab work. If this continues I suggested she see Dr. Claiborne Billings again soon.  Alysia Penna, MD

## 2017-03-21 NOTE — Telephone Encounter (Signed)
Appt today with Dr. Sarajane Jews.

## 2017-03-23 LAB — CARBON MONOXIDE, BLOOD (PERFORMED AT REF LAB): CARBON MONOXIDE, BLOOD: 5.6 % — AB (ref 0.0–3.6)

## 2017-03-28 ENCOUNTER — Telehealth: Payer: Self-pay | Admitting: Internal Medicine

## 2017-03-28 ENCOUNTER — Telehealth: Payer: Self-pay | Admitting: Cardiovascular Disease

## 2017-03-28 ENCOUNTER — Other Ambulatory Visit: Payer: Self-pay | Admitting: Cardiovascular Disease

## 2017-03-28 MED ORDER — SIMVASTATIN 20 MG PO TABS: 20.0000 mg | ORAL_TABLET | Freq: Every day | ORAL | 0 refills | Status: DC

## 2017-03-28 NOTE — Telephone Encounter (Signed)
Suggest office visit to determine appropriateness of ENT referral.  Patient also has concerns about a dermatitis.  I left a message on her home answering machine suggesting a office visit

## 2017-03-28 NOTE — Telephone Encounter (Signed)
Pt called - she needs to have her supply called in - says she get heart palpitations if she missed her medicine and she has not had her dose for today,she is out of medicine. She says this is urgent

## 2017-03-28 NOTE — Telephone Encounter (Signed)
Copied from [place] [phone]. Topic: Quick Communication - Rx Refill/Question >> Mar 28, 2017 10:28 AM Arletha Grippe wrote: Medication: levothyroxine (SYNTHROID, LEVOTHROID) 75 MCG tablet and alprazolam , and Bactrim ( not on med list)   Has the patient contacted their pharmacy? Yes.     (Agent: If no, request that the patient contact the pharmacy for the refill.)   Preferred Pharmacy (with phone number or street name): [facility] [place], [place] [phone] (Phone) [phone] (Fax)  [place], [place] - [address]  Pt wants levothyroxine to [facility] day supply [facility] and 90 day supply to go to [facility], she says the pharm does not have record of January refill Pt wants alprazolam [facility] Pt wants Bactrim to [facility] to [facility]  (not on med list)    Agent: Please be advised that RX refills may take up to 3 business days. We ask that you follow-up with your pharmacy.

## 2017-03-28 NOTE — Telephone Encounter (Signed)
Called and left message on home answering machine.  Suggested office visit

## 2017-03-28 NOTE — Telephone Encounter (Signed)
Please advise sir

## 2017-03-28 NOTE — Telephone Encounter (Signed)
Returned call to patient she was calling to get results of sleep study.Message sent to [facility] and Mariann Laster.

## 2017-03-28 NOTE — Telephone Encounter (Signed)
Haley [name] is wanting ot speak with a nurse . Please call

## 2017-03-28 NOTE — Telephone Encounter (Signed)
°*  STAT* If patient is at the pharmacy, call can be transferred to refill team.   1. Which medications need to be refilled? (please list name of each medication and dose if known)Metoprolol Succinate 25 mg, Simvastatin 20 mg   2. Which pharmacy/location (including street and city if local pharmacy) is medication to be sent to?[facility]   3. Do they need a 30 day or 90 day supply? [place]

## 2017-03-28 NOTE — Telephone Encounter (Signed)
OK for referral? Please advise 

## 2017-03-28 NOTE — Telephone Encounter (Signed)
Pt states she contacted [facility] and was told that the prescription for Levothyroxine and Alprazolam had not been received. Original prescription is showing that medications were faxed to [facility] on 01/30/2017. Pt states that pharmacy told her that she only had one refill of Alprazolam and no refills available for Levothyroxine. Pt asking if a temporary 30 day supply of Levothyroxine could be sent to [facility] in [place] until mail order supply is received.  Pt states she was requesting Bactrim for vaginal irritation. Advised pt that she would need to come in for an office visit to address this issue. Pt states she was told by gynecologist that she needed to keep this medication "on hand". Pt states she will contact gynecologist to see if the office will prescribe this medication.

## 2017-03-28 NOTE — Telephone Encounter (Signed)
Copied from [place]. Topic: Quick Communication - See Telephone Encounter >> Mar 28, 2017 11:25 AM Robina Ade, Helene Kelp D wrote: CRM for notification. See Telephone encounter for: 03/28/17. Patient would like to talk to Dr. Raliegh Ip about rashes she get in the spring time. She said he knows what she is talking about. Please call patient back, thanks.

## 2017-03-28 NOTE — Telephone Encounter (Signed)
Copied from [place]. Topic: Referral - Request >> Mar 28, 2017 11:21 AM Robina Ade, Helene Kelp D wrote: Reason for CRM: Patient would like to have a referral to go and see ENT to check her ear, nose and throat. She is having problems and is concern about it. Please call patient back, thanks.

## 2017-03-28 NOTE — Telephone Encounter (Signed)
Called and informed patient that her sleep study hasn't been read by Dr Claiborne Billings yet. Once he has read it and results it to me I will contact her. Patient voiced verbal understanding.

## 2017-03-29 ENCOUNTER — Other Ambulatory Visit: Payer: Self-pay | Admitting: Internal Medicine

## 2017-03-29 MED ORDER — LEVOTHYROXINE SODIUM 75 MCG PO TABS
75.0000 ug | ORAL_TABLET | Freq: Every day | ORAL | 3 refills | Status: DC
Start: 2017-03-29 — End: 2017-03-29

## 2017-03-29 MED ORDER — LEVOTHYROXINE SODIUM 75 MCG PO TABS
75.0000 ug | ORAL_TABLET | Freq: Every day | ORAL | 3 refills | Status: DC
Start: 2017-03-29 — End: 2017-04-08

## 2017-03-29 NOTE — Telephone Encounter (Signed)
Left message for patient to return phone call.  

## 2017-03-31 NOTE — Telephone Encounter (Signed)
Called pharmacy and patient picked medication up. No further action needed.

## 2017-04-08 ENCOUNTER — Other Ambulatory Visit: Payer: Self-pay | Admitting: Internal Medicine

## 2017-04-09 ENCOUNTER — Encounter (HOSPITAL_BASED_OUTPATIENT_CLINIC_OR_DEPARTMENT_OTHER): Payer: Self-pay | Admitting: Cardiovascular Disease

## 2017-04-09 NOTE — Procedures (Signed)
Patient Name: Haley [name], Haley [name] Date: 03/16/2017 Gender: Female D.O.B: 07-09-1945 Age (years): 71 Referring Provider: Shelva Majestic MD, ABSM Height (inches): 62 Interpreting Physician: Shelva Majestic MD, ABSM Weight (lbs): 143 RPSGT: Baxter Flattery BMI: 26 MRN: [id number] Neck Size: 13.00  CLINICAL INFORMATION The patient is referred for a CPAP titration to treat sleep apnea.  Date of NPSG, Split Night or HST: 01/22/2017: AHI 14.7/h; AHI with supine sleep 20.7/h; Oxygen desaturation to 82%  SLEEP STUDY TECHNIQUE As per the AASM Manual for the Scoring of Sleep and Associated Events v2.3 (April 2016) with a hypopnea requiring 4% desaturations.  The channels recorded and monitored were frontal, central and occipital EEG, electrooculogram (EOG), submentalis EMG (chin), nasal and oral airflow, thoracic and abdominal wall motion, anterior tibialis EMG, snore microphone, electrocardiogram, and pulse oximetry. Continuous positive airway pressure (CPAP) was initiated at the beginning of the study and titrated to treat sleep-disordered breathing.  MEDICATIONS  ???????    ALPRAZolam (XANAX) 0.5 MG tablet             aspirin 81 MG tablet         Calcium Carbonate (CALTRATE 600) 1500 MG TABS         cholecalciferol (VITAMIN D) 1000 UNITS tablet         clobetasol cream (TEMOVATE) 0.05 %         fluticasone (CUTIVATE) 0.05 % cream         fluticasone (FLONASE) 50 MCG/ACT nasal spray         ibuprofen (ADVIL,MOTRIN) 800 MG tablet         levothyroxine (SYNTHROID, LEVOTHROID) 75 MCG tablet         losartan (COZAAR) 25 MG tablet         meloxicam (MOBIC) 15 MG tablet         metoprolol succinate (TOPROL-XL) 25 MG 24 hr tablet         Multiple Vitamins-Minerals (MULTIVITAMIN WITH MINERALS) tablet         Omega-3 Fatty Acids (FISH OIL) 1000 MG CAPS         simvastatin (ZOCOR) 20 MG tablet         tretinoin (RETIN-A) 0.025 % cream         vitamin C (ASCORBIC ACID) 500 MG tablet       Medications self-administered by patient taken the night of the study : N/A  TECHNICIAN COMMENTS Comments added by technician: Patient requested to end study early due to PAP intolerance. PATIENT WAS FITTED ON AIR-FIT F30 SMALL, AIR-FIT F20 SMALL, DREAM WEAR FULL FACE MASK SMALL, AND AIR-FIT N20 SMALL.   RESPIRATORY PARAMETERS Optimal PAP Pressure (cm):  AHI at Optimal Pressure (/hr): N/A Overall Minimal O2 (%): 92.0 Supine % at Optimal Pressure (%): N/A Minimal O2 at Optimal Pressure (%): 92.0   SLEEP ARCHITECTURE The study was initiated at 11:24:28 PM and ended at 2:46:00 AM.  Sleep onset time was 42.7 minutes and the sleep efficiency was 29.8%%. The total sleep time was 60.0 minutes.  The patient spent 3.3%% of the night in stage N1 sleep, 96.7%% in stage N2 sleep, 0.0%% in stage N3 and 0.00% in REM.Stage REM latency was N/A minutes  Wake after sleep onset was 98.8. Alpha intrusion was absent. Supine sleep was 4.17%.  CARDIAC DATA The 2 lead EKG demonstrated sinus rhythm. The mean heart rate was 65.7 beats per minute. Other EKG findings include: None.  LEG MOVEMENT DATA The total Periodic Limb Movements of  Sleep (PLMS) were 0. The PLMS index was 0.0. A PLMS index of <15 is considered normal in adults.  IMPRESSIONS - This was an inadequate CPAP titration due to very poor sleep. An optimal PAP pressure could not be selected for this patient based on the available study data. - Central sleep apnea was not noted during this titration (CAI = 0.0/h). - Very poor sleep efficiency at only 29.8% - Significant oxygen desaturations were not observed during this titration (min O2 = 92.0%). - No snoring was audible during this study. - No cardiac abnormalities were observed during this study. - Clinically significant periodic limb movements were not noted during this study. Arousals associated with PLMs were rare.  DIAGNOSIS - Obstructive Sleep Apnea (327.23 [G47.33  ICD-10])  RECOMMENDATIONS - Recommend a trial of Auto-BiPAP with an EPAP min of 5, PS of 4 and an IPAP max of 15.  Patient did not tolerate CPAP. - Efforts should be made to optimize nasal and oral pharyngeal patency - Avoid alcohol, sedatives and other CNS depressants that may worsen sleep apnea and disrupt normal sleep architecture. - Sleep hygiene should be reviewed to assess factors that may improve sleep quality. - Weight management and regular exercise should be initiated or continued. - Recommend a download be obtained in 4 weeks after auto BiPAP and follow-up sleep clinic evaluation.   [Electronically signed] 04/09/2017 01:13 PM  Shelva Majestic MD, [facility], ABSM Diplomate, American Board of Sleep Medicine   NPI: [id number] [place] PH: [phone]   FX: [phone] [place]

## 2017-04-12 ENCOUNTER — Telehealth: Payer: Self-pay | Admitting: *Deleted

## 2017-04-12 NOTE — Telephone Encounter (Signed)
-----   Message from Troy Sine, MD sent at 04/09/2017  1:39 PM EDT ----- Mariann Laster .  Please arrange for DME company to trya home titration of BiPAP auto.  If unable to do home titration with BiPAP auto then do home CPAP auto titration since she did not tolerate the in lab titration study.

## 2017-04-13 ENCOUNTER — Telehealth: Payer: Self-pay | Admitting: *Deleted

## 2017-04-13 NOTE — Telephone Encounter (Signed)
BIPAP referral sent to [facility].

## 2017-04-13 NOTE — Telephone Encounter (Signed)
Patient informed of sleep study results and recommendations. She agrees to "try it and see what happens."

## 2017-04-27 ENCOUNTER — Telehealth: Payer: Self-pay | Admitting: Family Medicine

## 2017-04-27 NOTE — Telephone Encounter (Signed)
Copied from [place] [phone]. Topic: Appointment Scheduling - Scheduling Inquiry for Clinic >> Apr 27, 2017 10:51 AM Ether Griffins B wrote: Reason for CRM: pt requesting to transfer to Dr. Sarajane Jews since they have seen them before with Dr. Raliegh Ip leaving. Also requesting CPE set up in Oct.

## 2017-05-01 NOTE — Telephone Encounter (Signed)
Sorry but I am too full to take her

## 2017-05-02 ENCOUNTER — Other Ambulatory Visit: Payer: Self-pay | Admitting: Internal Medicine

## 2017-05-02 NOTE — Telephone Encounter (Signed)
Copied from [place] [id number]. Topic: Quick Communication - Rx Refill/Question >> May 02, 2017  2:12 PM Arletha Grippe wrote: Medication: fluticasone (FLONASE) 50 MCG/ACT nasal spray Has the patient contacted their pharmacy? Yes.   (Agent: If no, request that the patient contact the pharmacy for the refill.) Preferred Pharmacy (with phone number [facility] street name): [facility]  Agent: Please be advised that RX refills may take up to 3 business days. We ask that you follow-up with your pharmacy.

## 2017-05-02 NOTE — Telephone Encounter (Signed)
Pt has been scheduled with Dr [place].

## 2017-05-03 MED ORDER — FLUTICASONE PROPIONATE 50 MCG/ACT NA SUSP: 2.0000 | Freq: Every day | NASAL | 3 refills | Status: DC

## 2017-05-03 NOTE — Telephone Encounter (Signed)
Nasonex refill request  LOV 10/31/16 with Dr. Burnice Logan.  Last refill:  10/30/15   [facility] [place], [place]

## 2017-05-05 ENCOUNTER — Other Ambulatory Visit: Payer: Self-pay

## 2017-05-08 ENCOUNTER — Telehealth: Payer: Self-pay | Admitting: Family Medicine

## 2017-05-08 ENCOUNTER — Telehealth: Payer: Self-pay | Admitting: Internal Medicine

## 2017-05-08 NOTE — Telephone Encounter (Signed)
Copied from [place] [phone]. Topic: Quick Communication - Rx Refill/Question >> May 08, 2017  1:51 PM Waylan Rocher, Lumin L wrote: Medication: ALPRAZolam (XANAX) 0.5 MG tablet (90 days supply) Has the patient contacted their pharmacy? Yes.   (Agent: If no, request that the patient contact the pharmacy for the refill.) Preferred Pharmacy (with phone number or street name): [facility] [place], [place] [address] [place] [address] Phone: [phone] Fax: [phone] Agent: Please be advised that RX refills may take up to 3 business days. We ask that you follow-up with your pharmacy.

## 2017-05-08 NOTE — Telephone Encounter (Signed)
Copied from [place] [phone]. Topic: Referral - Request >> May 08, 2017  1:53 PM Valla Leaver wrote: Reason for CRM: Patient would like an urgent referral to ENT within [place] or [place].. Please call patient once submitted.

## 2017-05-09 NOTE — Telephone Encounter (Signed)
Rx refill request: Alprazolam 0.5 mg         Last filled: 01/30/17  #90   LOV: 03/21/17   PCP: [place]: verified

## 2017-05-10 MED ORDER — ALPRAZOLAM 0.5 MG PO TABS: ORAL_TABLET | ORAL | 3 refills | Status: DC

## 2017-05-11 ENCOUNTER — Telehealth: Payer: Self-pay | Admitting: Cardiovascular Disease

## 2017-05-11 ENCOUNTER — Other Ambulatory Visit: Payer: Self-pay | Admitting: Internal Medicine

## 2017-05-11 ENCOUNTER — Other Ambulatory Visit: Payer: Self-pay | Admitting: Cardiovascular Disease

## 2017-05-11 ENCOUNTER — Telehealth: Payer: Self-pay | Admitting: Internal Medicine

## 2017-05-11 MED ORDER — SIMVASTATIN 20 MG PO TABS: 20.0000 mg | ORAL_TABLET | Freq: Every day | ORAL | 0 refills | Status: DC

## 2017-05-11 NOTE — Telephone Encounter (Signed)
New Message    *STAT* If patient is at the pharmacy, call can be transferred to refill team.   1. Which medications need to be refilled? (please list name of each medication and dose if known) metoprolol succinate (TOPROL-XL) 25 MG 24 hr tablet and simvastatin (ZOCOR) 20 MG tablet  2. Which pharmacy/location (including street and city if local pharmacy) is medication to be sent to? [facility] - [place], [place]  3. Do they need a 30 day or 90 day supply? [place]

## 2017-05-11 NOTE — Telephone Encounter (Signed)
Pt called in to follow up on referral request to ENT. Please advise.

## 2017-05-11 NOTE — Telephone Encounter (Signed)
Left message to return call 

## 2017-05-11 NOTE — Telephone Encounter (Signed)
Copied from [place] [phone]. Topic: Quick Communication - Rx Refill/Question >> May 11, 2017  1:43 PM Aurelio Brash B wrote: Medication: bactrim  ds 800 -160   Has the patient contacted their pharmacy? {no  (Agent: If no, request that the patient contact the pharmacy for the refill.)  Preferred Pharmacy (with phone number or street name): [place], [place] - [place] [address] [phone] (Phone) [phone] (Fax)       Agent: Please be advised that RX refills may take up to 3 business days. We ask that you follow-up with your pharmacy.

## 2017-05-11 NOTE — Telephone Encounter (Signed)
Pt called to check on status of refill  

## 2017-05-11 NOTE — Telephone Encounter (Signed)
Called pt. To discuss her request for Bactrim DS.  Stated she was on it, in the past, for tendency for bladder infection. Was advised to keep it on hand.   Stated she has had urinary urgency and incontinence of urine "for awhile", now has increased in past month. Denied burning with urination.  Stated her urine has "strong, fruity odor."  Denied any backache, or fever/ chills.  Also c/o having intermittent "hot flashes."  Advised will send message to Dr. Burnice Logan.

## 2017-05-11 NOTE — Telephone Encounter (Signed)
REFILL 

## 2017-05-11 NOTE — Telephone Encounter (Signed)
Patient notified- her prescription has been sent in.

## 2017-05-12 NOTE — Telephone Encounter (Signed)
Spoke to patient to see if she need to be seen today rather than 05/15/2017. Patient stated she could wait. No further action needed.

## 2017-05-12 NOTE — Telephone Encounter (Signed)
Patient has an appointment 05/16/2017.Will speak to her then.

## 2017-05-16 ENCOUNTER — Encounter: Payer: Self-pay | Admitting: Internal Medicine

## 2017-05-16 ENCOUNTER — Ambulatory Visit (INDEPENDENT_AMBULATORY_CARE_PROVIDER_SITE_OTHER): Payer: PPO | Admitting: Internal Medicine

## 2017-05-16 VITALS — BP 138/72 | HR 54 | Temp 98.1°F | Wt 143.0 lb

## 2017-05-16 DIAGNOSIS — E039 Hypothyroidism, unspecified: Secondary | ICD-10-CM | POA: Diagnosis not present

## 2017-05-16 DIAGNOSIS — N3941 Urge incontinence: Secondary | ICD-10-CM | POA: Diagnosis not present

## 2017-05-16 LAB — POCT URINALYSIS DIPSTICK
Bilirubin, UA: NEGATIVE
GLUCOSE UA: NEGATIVE
Ketones, UA: NEGATIVE
Leukocytes, UA: NEGATIVE
Protein, UA: NEGATIVE
Spec Grav, UA: 1.02 (ref 1.010–1.025)
Urobilinogen, UA: 0.2 E.U./dL
pH, UA: 6 (ref 5.0–8.0)

## 2017-05-16 MED ORDER — MIRABEGRON ER 25 MG PO TB24
25.0000 mg | ORAL_TABLET | Freq: Every day | ORAL | 4 refills | Status: DC
Start: 2017-05-16 — End: 2017-05-18

## 2017-05-16 NOTE — Patient Instructions (Signed)

## 2017-05-16 NOTE — Progress Notes (Signed)
Subjective:    Patient ID: Haley [name], female    DOB: 09-18-1945, 72 y.o.   MRN: [id number]  HPI  72 year old patient who has a history of urge incontinence as well as history of UTIs.  She was seen by gynecology in November and was treated with Bactrim DS.  More recently she has had urinary incontinence and states that she soaks 2 pads per day.  Denies any dysuria.  No fever chills She has had no recent antibiotic use.  Past Medical History:  Diagnosis Date  . Allergy    SEASONAL  . Cataract    BILATERAL  . Fibromyalgia   . GERD (gastroesophageal reflux disease)   . H/O echocardiogram 04/28/08   EF>55% trace mitral regurgitation, No significant valvular pathology  . Hemorrhoids    internal  . History of stress test 02/08/2011   Normal Myocardial perfusion study, this is a low risk scan, No prior study available for comparison  . Hyperlipidemia   . MVP (mitral valve prolapse)    mild and MR  . OA (osteoarthritis)   . Osteoporosis   . Paroxysmal A-fib ([place])   . Thyroid disease   . Varicosities of leg   . Vitamin D deficiency      Social History   Socioeconomic History  . Marital status: Married    Spouse name: Not on file  . Number of children: Not on file  . Years of education: Not on file  . Highest education level: Not on file  Occupational History  . Not on file  Social Needs  . Financial resource strain: Not on file  . Food insecurity:    Worry: Not on file    Inability: Not on file  . Transportation needs:    Medical: Not on file    Non-medical: Not on file  Tobacco Use  . Smoking status: Current Every Day Smoker    Types: Cigars  . Smokeless tobacco: Never Used  . Tobacco comment: peach flavor   Substance and Sexual Activity  . Alcohol use: No    Alcohol/week: 0.0 oz  . Drug use: No  . Sexual activity: Yes    Comment: 1st intercourse 17yo-1 partner  Lifestyle  . Physical activity:    Days per week: Not on file    Minutes per session: Not on  file  . Stress: Not on file  Relationships  . Social connections:    Talks on phone: Not on file    Gets together: Not on file    Attends religious service: Not on file    Active member of club or organization: Not on file    Attends meetings of clubs or organizations: Not on file    Relationship status: Not on file  . Intimate partner violence:    Fear of current or ex partner: Not on file    Emotionally abused: Not on file    Physically abused: Not on file    Forced sexual activity: Not on file  Other Topics Concern  . Not on file  Social History Narrative  . Not on file    Past Surgical History:  Procedure Laterality Date  . ABDOMINAL HYSTERECTOMY  1989  . APPENDECTOMY  1989   with hysterectomy  . BREAST EXCISIONAL BIOPSY Left 1995   Benign  . COLONOSCOPY  11-20-2000  . NOSE SURGERY  1990    Family History  Problem Relation Age of Onset  . Diabetes Daughter   . Diabetes Mother   .  Hypertension Mother   . Heart disease Mother   . Stroke Maternal Grandmother   . Colon cancer Neg Hx     Allergies  Allergen Reactions  . Tramadol Hcl     REACTION: jittery    Current Outpatient Medications on File Prior to Visit  Medication Sig Dispense Refill  . ALPRAZolam (XANAX) 0.5 MG tablet TAKE ONE TABLET BY MOUTH ONCE DAILY AS NEEDED 90 tablet 3  . aspirin 81 MG tablet Take 81 mg by mouth daily.      . Calcium Carbonate (CALTRATE 600) 1500 MG TABS Take by mouth.      . cholecalciferol (VITAMIN D) 1000 UNITS tablet Take 1,000 Units by mouth daily.     . clobetasol cream (TEMOVATE) 0.05 % Apply twice a week first week then once weekly as needed 30 g 2  . fluticasone (CUTIVATE) 0.05 % cream Apply 1 application topically 2 (two) times daily.     . fluticasone (FLONASE) 50 MCG/ACT nasal spray Place 2 sprays into both nostrils daily. 48 g 3  . fluticasone (FLONASE) 50 MCG/ACT nasal spray Use 2 sprays in each nostril daily 48 g 2  . ibuprofen (ADVIL,MOTRIN) 800 MG tablet Take 1  tablet (800 mg total) by mouth every 6 (six) hours as needed. 60 tablet 0  . levothyroxine (SYNTHROID, LEVOTHROID) 75 MCG tablet Take 1 tablet by mouth daily 90 tablet 2  . losartan (COZAAR) 25 MG tablet Take 25 mg by mouth daily.    . meloxicam (MOBIC) 15 MG tablet Take 1 tablet (15 mg total) by mouth daily. 30 tablet 0  . metoprolol succinate (TOPROL-XL) 25 MG 24 hr tablet Take 1 tablet by mouth once daily 90 tablet 1  . Multiple Vitamins-Minerals (MULTIVITAMIN WITH MINERALS) tablet Take 1 tablet by mouth daily.      . Omega-3 Fatty Acids (FISH OIL) 1000 MG CAPS Take 2 capsules by mouth 2 (two) times daily.    . simvastatin (ZOCOR) 20 MG tablet Take 1 tablet (20 mg total) by mouth at bedtime. 90 tablet 0  . tretinoin (RETIN-A) 0.025 % cream Apply topically at bedtime.     . vitamin C (ASCORBIC ACID) 500 MG tablet Take 500 mg by mouth daily.       No current facility-administered medications on file prior to visit.     BP 138/72 (BP Location: Right Arm, Patient Position: Sitting, Cuff Size: Large)   Pulse (!) 54   Temp 98.1 F (36.7 C) (Oral)   Wt 143 lb (64.9 kg)   SpO2 97%   BMI 26.16 kg/m     Review of Systems  Constitutional: Negative.   HENT: Negative for congestion, dental problem, hearing loss, rhinorrhea, sinus pressure, sore throat and tinnitus.   Eyes: Negative for pain, discharge and visual disturbance.  Respiratory: Negative for cough and shortness of breath.   Cardiovascular: Negative for chest pain, palpitations and leg swelling.  Gastrointestinal: Negative for abdominal distention, abdominal pain, blood in stool, constipation, diarrhea, nausea and vomiting.  Genitourinary: Positive for frequency and urgency. Negative for difficulty urinating, dysuria, flank pain, hematuria, pelvic pain, vaginal bleeding, vaginal discharge and vaginal pain.  Musculoskeletal: Negative for arthralgias, gait problem and joint swelling.  Skin: Negative for rash.  Neurological: Negative  for dizziness, syncope, speech difficulty, weakness, numbness and headaches.  Hematological: Negative for adenopathy.  Psychiatric/Behavioral: Negative for agitation, behavioral problems and dysphoric mood. The patient is not nervous/anxious.        Objective:   Physical Exam  Constitutional: She is oriented to person, place, and time. She appears well-developed and well-nourished.  HENT:  Head: Normocephalic.  Right Ear: External ear normal.  Left Ear: External ear normal.  Mouth/Throat: Oropharynx is clear and moist.  Eyes: Pupils are equal, round, and reactive to light. Conjunctivae and EOM are normal.  Neck: Normal range of motion. Neck supple. No thyromegaly present.  Cardiovascular: Normal rate, regular rhythm, normal heart sounds and intact distal pulses.  Pulmonary/Chest: Effort normal and breath sounds normal.  Abdominal: Soft. Bowel sounds are normal. She exhibits no distension and no mass. There is no tenderness.  Musculoskeletal: Normal range of motion.  Lymphadenopathy:    She has no cervical adenopathy.  Neurological: She is alert and oriented to person, place, and time.  Skin: Skin is warm and dry. No rash noted.  Psychiatric: She has a normal mood and affect. Her behavior is normal.          Assessment & Plan:   Urge incontinence.  Rule out UTI.  Will review a urinalysis.  Further treatment pending results of UA Hypothyroidism stable  CPX 6 months  Haley [name]   Addendum.  Urinalysis was normal.  Patient given information about overactive bladder.  Will try [place] exercises and bladder training.  If this is not successful we will give a trial of [place]

## 2017-05-17 ENCOUNTER — Telehealth: Payer: Self-pay | Admitting: Internal Medicine

## 2017-05-17 NOTE — Telephone Encounter (Signed)
Copied from [place] [id number]. Topic: Quick Communication - See Telephone Encounter >> May 17, 2017 10:49 AM Ahmed Prima L wrote: CRM for notification. See Telephone encounter for: 05/17/17.  [facility] called in regards to mirabegron ER (MYRBETRIQ) 25 MG TB24 tablet. They want to know if it can be changed to 90 day supply. Please call back @ [phone], can leave a message.

## 2017-05-18 ENCOUNTER — Ambulatory Visit: Payer: PPO | Admitting: Cardiovascular Disease

## 2017-05-18 ENCOUNTER — Encounter: Payer: Self-pay | Admitting: Cardiovascular Disease

## 2017-05-18 ENCOUNTER — Other Ambulatory Visit: Payer: Self-pay

## 2017-05-18 VITALS — BP 152/78 | HR 47 | Ht 62.0 in | Wt 144.4 lb

## 2017-05-18 DIAGNOSIS — E039 Hypothyroidism, unspecified: Secondary | ICD-10-CM

## 2017-05-18 DIAGNOSIS — I10 Essential (primary) hypertension: Secondary | ICD-10-CM | POA: Diagnosis not present

## 2017-05-18 DIAGNOSIS — G4733 Obstructive sleep apnea (adult) (pediatric): Secondary | ICD-10-CM | POA: Diagnosis not present

## 2017-05-18 DIAGNOSIS — I480 Paroxysmal atrial fibrillation: Secondary | ICD-10-CM | POA: Diagnosis not present

## 2017-05-18 DIAGNOSIS — E785 Hyperlipidemia, unspecified: Secondary | ICD-10-CM

## 2017-05-18 MED ORDER — MIRABEGRON ER 25 MG PO TB24: 25.0000 mg | ORAL_TABLET | Freq: Every day | ORAL | 2 refills | Status: DC

## 2017-05-18 MED ORDER — AMLODIPINE BESYLATE 5 MG PO TABS: 5.0000 mg | ORAL_TABLET | Freq: Every day | ORAL | 1 refills | Status: DC

## 2017-05-18 NOTE — Progress Notes (Signed)
Patient ID: Haley [name], female   DOB: 02/06/1945, 72 y.o.   MRN: [id number]    PCP: Dr. Bluford Kaufmann  HPI: Haley [name] is a 72 y.o. female presents to the office today for a 3 month followup evaluation.  Haley [name]  has a history of palpitations, paroxysmal atrial fibrillation, hyperlipidemia, GERD, and hypothyroidism. She has been found to have an atherogenic dyslipidemia lipid panel. She also has a history of lower extremity varicosities and wears support stockings. Remotely she was having difficulty remembering to take her metoprolol on a twice a day regimen at that time I switched her to metoprolol succinate Toprol-XL 25 mg daily. She has felt markedly improved with reference to taking this once a day preparation. She is unaware of any breakthrough tachycardia palpitations. She denies presyncope or syncope. An NMR lipoprofile  showed marked improvement with a cholesterol of 174, triglycerides 119, HDL C. cholesterol 60, and LDL cholesterol at 90. Her LDL particle number was 1020 and her HDL particle number was 39.5. Her insulin resistance score was 39 which was normal.   Laboratory last year showed a total cholesterol 165, triglycerides 100, HDL 54, LDL 91.  TSH was 2.03.  She was not anemic.  She normal chemistry profile.  Since I last saw her, she denies any episodes of chest pain or significant palpitations.  She admits to occasional leg cramps.  She also at times note some very mild left ankle swelling.  She has had issues with bladder incontinence and sees Dr. Toney Rakes. She has been taking Toprol 25 mg daily.  She is tolerated simvastatin 20 mg for hyperlipidemia.  She is on 75 g of levothyroxine for hypothyroidism.  She also has osteopenia for which she takes Fosamax.    When I last saw her, she denied any episodes of chest pain.  .  She had occasional leg cramps.  She has had issues with bladder incontinence for which she had seen Dr. Toney Rakes in the past.  She admits to  intermittent right leg discomfort.  She tells me that her husband has witnessed apneic spells at night.  She has a history of snoring.  .  She has a history of hypothyroidism on Synthroid replacement at 75 g.  She has been taking simvastatin 20 g hyperlipidemia.  She underwent a home sleep study for evaluation of sleep apnea  on 01/22/2017.  She was found to have moderate sleep apnea overall with an AHI of 14.7/h.  There was a significant positional component with supine sleep HI 20.7 versus nonsupine sleep HI.  89.5 per hour.  Since this was a home study, no data is available regarding AHI during REM sleep.  She had oxygen desaturation to a nadir of 82%.  Since I last saw her, she was referred for a CPAP titration study which was done on 03/16/2017.  This study was inadequate due to the patient's very poor sleep.  Her sleep efficiency was only 29%.  There was no snoring.  The patient was tried on multiple masks and could not tolerate anyone.  She did not seem interested in pursuing CPAP.  Has a history of hypertension and apparently stopped taking losartan 3 months ago.  She has continued to take metoprolol, succinate 25 mg daily.  She is on simvastatin for hyperlipidemia.  She has hypothyroidism on levothyroxine.  She presents for reevaluation.  Past Medical History:  Diagnosis Date  . Allergy    SEASONAL  . Cataract    BILATERAL  .  Fibromyalgia   . GERD (gastroesophageal reflux disease)   . H/O echocardiogram 04/28/08   EF>55% trace mitral regurgitation, No significant valvular pathology  . Hemorrhoids    internal  . History of stress test 02/08/2011   Normal Myocardial perfusion study, this is a low risk scan, No prior study available for comparison  . Hyperlipidemia   . MVP (mitral valve prolapse)    mild and MR  . OA (osteoarthritis)   . Osteoporosis   . Paroxysmal A-fib ([place])   . Thyroid disease   . Varicosities of leg   . Vitamin D deficiency     Past Surgical History:    Procedure Laterality Date  . ABDOMINAL HYSTERECTOMY  1989  . APPENDECTOMY  1989   with hysterectomy  . BREAST EXCISIONAL BIOPSY Left 1995   Benign  . COLONOSCOPY  11-20-2000  . NOSE SURGERY  1990    Allergies  Allergen Reactions  . Tramadol Hcl     REACTION: jittery    Current Outpatient Medications  Medication Sig Dispense Refill  . ALPRAZolam (XANAX) 0.5 MG tablet TAKE ONE TABLET BY MOUTH ONCE DAILY AS NEEDED 90 tablet 3  . aspirin 81 MG tablet Take 81 mg by mouth daily.      . Calcium Carbonate (CALTRATE 600) 1500 MG TABS Take by mouth.      . cholecalciferol (VITAMIN D) 1000 UNITS tablet Take 1,000 Units by mouth daily.     . clobetasol cream (TEMOVATE) 0.05 % Apply twice a week first week then once weekly as needed 30 g 2  . fluticasone (CUTIVATE) 0.05 % cream Apply 1 application topically 2 (two) times daily.     . fluticasone (FLONASE) 50 MCG/ACT nasal spray Use 2 sprays in each nostril daily 48 g 2  . ibuprofen (ADVIL,MOTRIN) 800 MG tablet Take 1 tablet (800 mg total) by mouth every 6 (six) hours as needed. 60 tablet 0  . levothyroxine (SYNTHROID, LEVOTHROID) 75 MCG tablet Take 1 tablet by mouth daily 90 tablet 2  . meloxicam (MOBIC) 15 MG tablet Take 1 tablet (15 mg total) by mouth daily. 30 tablet 0  . metoprolol succinate (TOPROL-XL) 25 MG 24 hr tablet Take 1 tablet by mouth once daily 90 tablet 1  . Multiple Vitamins-Minerals (MULTIVITAMIN WITH MINERALS) tablet Take 1 tablet by mouth daily.      . Omega-3 Fatty Acids (FISH OIL) 1000 MG CAPS Take 2 capsules by mouth 2 (two) times daily.    . simvastatin (ZOCOR) 20 MG tablet Take 1 tablet (20 mg total) by mouth at bedtime. 90 tablet 0  . tretinoin (RETIN-A) 0.025 % cream Apply topically at bedtime.     . vitamin C (ASCORBIC ACID) 500 MG tablet Take 500 mg by mouth daily.      Marland Kitchen amLODipine (NORVASC) 5 MG tablet Take 1 tablet (5 mg total) by mouth daily. 30 tablet 1  . mirabegron ER (MYRBETRIQ) 25 MG TB24 tablet Take 1  tablet (25 mg total) by mouth daily. 90 tablet 2   No current facility-administered medications for this visit.     Socially she is married and has 2 children and 4 grandchildren with one great grandchild. She smokes little cigars. [facility].  ROS General: Negative; No fevers, chills, or night sweats;  HEENT: Negative; No changes in vision or hearing, sinus congestion, difficulty swallowing , history of a broken nose during childhood, status post surgery 1990 Pulmonary: Negative; No cough,  wheezing, shortness of breath, hemoptysis Cardiovascular: Negative; No chest pain, presyncope, syncope, palpitations; occasional left ankle swelling GI: Negative; No nausea, vomiting, diarrhea, or abdominal pain GU: Negative; No dysuria, hematuria, or difficulty voiding Musculoskeletal:Positive for right leg discomfort; no myalgias, joint pain, or weakness Hematologic/Oncology: Negative; no easy bruising, bleeding Endocrine: Positive for hypothyroidism, on Synthroid e; no diabetes Neuro: Negative; no changes in balance, headaches Skin: Negative; No rashes or skin lesions Psychiatric: Positive for anxiety Sleep: Positive for witnessed apnea and snoring.  She denies  daytime sleepiness, hypersomnolence, bruxism, restless legs, hypnogognic hallucinations, no cataplexy Other comprehensive 14 point system review is negative.   PE BP (!) 152/78   Pulse (!) 47   Ht _0  (1.575 m)   Wt 144 lb 6.4 oz (65.5 kg)   BMI 26.41 kg/m    Repeat blood pressure was elevated by me at 144/80  Wt Readings from Last 3 Encounters:  05/18/17 144 lb 6.4 oz (65.5 kg)  05/16/17 143 lb (64.9 kg)  03/21/17 146 lb 9.6 oz (66.5 kg)   General: Alert, oriented, no distress.  Skin: normal turgor, no rashes, warm and dry HEENT: Normocephalic, atraumatic. Pupils equal round and reactive to light; sclera anicteric; extraocular muscles intact;  Nose without nasal septal  hypertrophy Mouth/Parynx benign; Mallinpatti scale 3 Neck: No JVD, no carotid bruits; normal carotid upstroke Lungs: clear to ausculatation and percussion; no wheezing or rales Chest wall: without tenderness to palpitation Heart: PMI not displaced, RRR, s1 s2 normal, 1/6 systolic murmur, no diastolic murmur, no rubs, gallops, thrills, or heaves Abdomen: soft, nontender; no hepatosplenomehaly, BS+; abdominal aorta nontender and not dilated by palpation. Back: no CVA tenderness Pulses 2+ Musculoskeletal: full range of motion, normal strength, no joint deformities Extremities: no clubbing cyanosis or edema, Homan's sign negative  Neurologic: grossly nonfocal; Cranial nerves grossly wnl Psychologic: Normal mood and affect   ECG (independently read by me): Sinus bradycardia at 47 bpm.  No ectopy.  Normal intervals.  February 2019 ECG (independently read by me): Sinus bradycardia 54 bpm.  QS complex V1 V2.  Normal intervals  October 2018 ECG (independently read by me): Sinus bradycardia 56 bpm.  QS V1, V2.  Normal intervals.  November 2017 ECG (independently read by me): Sinus bradycardia 52 bpm.  QS complex precordially.  November 2016 ECG (independently read by me): Sinus bradycardia 52 bpm.  Normal intervals.  No ST segment changes.  April 2016 ECG (independently read by me): Sinus bradycardia at 54 bpm.  Normal intervals.  No significant ST segment changes  September 2015 ECG (independently read by me): Sinus bradycardia 52 beats per minute.  QS complex in V1 and V2.  Nonspecific ST change.  QTc interval 398 ms  Prior March 2015 ECG (independently read by me): Sinus bradycardia 51 beats per minute. No ectopy. Normal intervals.  Prior 09/14/2012 ECG: Sinus rhythm at 53 beats per minute. She has poor anterior R-wave progression. Normal intervals  LABS:  BMP Latest Ref Rng & Units 03/21/2017 10/31/2016 11/17/2015  Glucose 70 - 99 mg/dL 86 89 84  BUN 6 - 23 mg/dL _1 Creatinine  0.40 - 1.20 mg/dL 0.68 0.68 0.76  Sodium 135 - 145 mEq/L 140 143 140  Potassium 3.5 - 5.1 mEq/L 4.2 4.0 4.1  Chloride 96 - 112 mEq/L 105 106 106  CO2 19 - 32 mEq/L _2 Calcium 8.4 - 10.5 mg/dL 9.5 9.3 9.3    Hepatic Function Latest Ref Rng & Units 10/31/2016 11/17/2015  04/25/2014  Total Protein 6.0 - 8.3 g/dL 6.5 6.5 6.5  Albumin 3.5 - 5.2 g/dL 4.0 3.8 3.7  AST 0 - 37 U/L _0 ALT 0 - 35 U/L _1 Alk Phosphatase 39 - 117 U/L 64 67 75  Total Bilirubin 0.2 - 1.2 mg/dL 0.4 0.4 0.3  Bilirubin, Direct 0.0 - 0.3 mg/dL - - -    CBC Latest Ref Rng & Units 03/21/2017 10/31/2016 11/17/2015  WBC 4.0 - 10.5 K/uL 8.4 9.0 8.7  Hemoglobin 12.0 - 15.0 g/dL 13.5 13.8 13.4  Hematocrit 36.0 - 46.0 % 40.2 41.5 41.3  Platelets 150.0 - 400.0 K/uL 204.0 219.0 222   Lab Results  Component Value Date   MCV 91.1 03/21/2017   MCV 91.0 10/31/2016   MCV 91.2 11/17/2015    Lab Results  Component Value Date   TSH 0.59 03/21/2017   Lab Results  Component Value Date   HGBA1C 6.0 08/06/2012   BNP No results found for: PROBNP  Lipid Panel     Component Value Date/Time   CHOL 168 10/31/2016 0912   TRIG 135.0 10/31/2016 0912   HDL 56.00 10/31/2016 0912   CHOLHDL 3 10/31/2016 0912   VLDL 27.0 10/31/2016 0912   LDLCALC 85 10/31/2016 0912     RADIOLOGY: No results found.   IMPRESSION:  1. OSA (obstructive sleep apnea)   2. Essential hypertension   3. PAF (paroxysmal atrial fibrillation) ([place])   4. Dyslipidemia   5. Hypothyroidism, unspecified type     ASSESSMENT AND PLAN: Haley [name] is a 72 year old female who has a history of palpitations that have improved with once a day long-acting beta blocker therapy.  She has not had any recurrent atrial fibrillation and  is maintaining sinus rhythm.  She is bradycardic and remains asymptomati and specifically denies any dizziness or orthostatic symptoms.  She has been demonstrated to have normal systolic and diastolic function on an  echo Doppler study in 2013.  There was mild aortic sclerosis without stenosis. A nuclear perfusion study  in January 2013 showed normal perfusion without scar or ischemia and she had a post stress ejection fraction of 75%.  Due to recent blood pressure elevations, I had initiated low-dose losartan at 25 mg.  Apparently, she tells me she stopped taking losartan 3 months ago.  Since she realized that she had been gargling with salt water and thought the salt water was contributing to her hypertension and that she did not need to losartan anymore.  However, her blood pressure today is elevated and I have recommended improved blood pressure.  I will start her on amlodipine at 5 mg I again reviewed her diagnostic polysomnogram which confirms moderate sleep apnea with significant oxygen desaturation to a nadir of 82%.  She did not tolerate her CPAP titration study.  I discussed alternatives to CPAP therapy, such as a customized oral appliance.  In addition, she would like to see an ENT evaluation since she often has clogged ears.  I will refer her to ENT for also discussion concerning [facility] with nerve stimulation therapy for sleep apnea.  She continues to be on simvastatin for hyperlipidemia.  She is on levothyroxine 75 g for hypothyroidism.  I will see her in 4 months for reevaluation.    Time spent: 25 minutes Troy Sine, MD, [facility]  05/20/2017 11:28 AM

## 2017-05-18 NOTE — Telephone Encounter (Signed)
Left message to return call 

## 2017-05-18 NOTE — Patient Instructions (Signed)
Medication Instructions:  START amlodipine 5 mg daily  Follow-Up: You have been referred to ENT and Dr. Ron Parker   4 months with Dr. Claiborne Billings      If you need a refill on your cardiac medications before your next appointment, please call your pharmacy.

## 2017-05-18 NOTE — Telephone Encounter (Signed)
90 day supply sent in 

## 2017-05-18 NOTE — Telephone Encounter (Signed)
[facility]. No further action needed.

## 2017-05-20 ENCOUNTER — Encounter: Payer: Self-pay | Admitting: Cardiovascular Disease

## 2017-06-08 ENCOUNTER — Telehealth: Payer: Self-pay | Admitting: Cardiovascular Disease

## 2017-06-08 NOTE — Telephone Encounter (Signed)
Pt states the she doesn't want to take amlodpine due to side effect and would prefer to take Losartan. She states she has already reorder the Losartan through her pharmacy.   Routing to [facility] for recommendation.

## 2017-06-08 NOTE — Telephone Encounter (Signed)
Return call to pt. No answer and unable to leave message.

## 2017-06-08 NOTE — Telephone Encounter (Signed)
New message    Pt c/o medication issue:  1. Name of Medication: amLODipine (NORVASC) 5 MG tablet  2. How are you currently taking this medication (dosage and times per day)?   3. Are you having a reaction (difficulty breathing--STAT)?   4. What is your medication issue? Patient does not want to take Amlodipine, wants to take Losartan

## 2017-06-08 NOTE — Telephone Encounter (Signed)
Left message to call back  

## 2017-06-08 NOTE — Telephone Encounter (Signed)
She was the one who self discontinued losartan; ok to resume in place of amlodipine

## 2017-06-09 MED ORDER — LOSARTAN POTASSIUM 25 MG PO TABS: 25.0000 mg | ORAL_TABLET | Freq: Every day | ORAL | 3 refills | Status: DC

## 2017-06-09 NOTE — Telephone Encounter (Signed)
Spoke with pt and advised that per Dr. Claiborne Billings, she is ok to discontinue the amlodipine and start back on the losartan. Medication list updated.

## 2017-07-03 ENCOUNTER — Telehealth: Payer: Self-pay | Admitting: *Deleted

## 2017-07-03 NOTE — Telephone Encounter (Signed)
Copied from [place] [id number]. Topic: Referral - Request >> Jun 30, 2017  1:30 PM Bea Graff, NT wrote: Reason for CRM: Pt calling to request a referral to and ENT for problems with her throat.

## 2017-07-04 NOTE — Telephone Encounter (Signed)
Left message to return phone call.

## 2017-07-04 NOTE — Telephone Encounter (Signed)
Suggest follow-up office visit here to evaluate treat and determine need for referral

## 2017-07-04 NOTE — Telephone Encounter (Signed)
Okay for referral? Please advise   

## 2017-07-05 NOTE — Telephone Encounter (Signed)
Phone lines are down! Cannot call pt.

## 2017-07-06 NOTE — Telephone Encounter (Signed)
Pt stated that she will call her heart doctor to see if he can write the referral. She believes that theres nothing else that can be done but to see ENT. She also stated that she can just call the ENT office and schedule an appointment without the referral she just needed Dr.Kwiatkowski opinion. Pt was told to keep [place] informed regarding the referral.

## 2017-08-16 DIAGNOSIS — J302 Other seasonal allergic rhinitis: Secondary | ICD-10-CM | POA: Diagnosis not present

## 2017-08-16 DIAGNOSIS — K148 Other diseases of tongue: Secondary | ICD-10-CM | POA: Diagnosis not present

## 2017-08-16 DIAGNOSIS — G4733 Obstructive sleep apnea (adult) (pediatric): Secondary | ICD-10-CM | POA: Diagnosis not present

## 2017-08-16 DIAGNOSIS — H6121 Impacted cerumen, right ear: Secondary | ICD-10-CM | POA: Diagnosis not present

## 2017-08-16 DIAGNOSIS — H6123 Impacted cerumen, bilateral: Secondary | ICD-10-CM | POA: Insufficient documentation

## 2017-08-16 DIAGNOSIS — F17200 Nicotine dependence, unspecified, uncomplicated: Secondary | ICD-10-CM | POA: Diagnosis not present

## 2017-08-17 ENCOUNTER — Telehealth: Payer: Self-pay | Admitting: Cardiovascular Disease

## 2017-08-17 NOTE — Telephone Encounter (Signed)
Received records from [facility] on 08/17/17, Appt on 10/04/17 @ 11:40AM. [place]

## 2017-09-13 ENCOUNTER — Ambulatory Visit (INDEPENDENT_AMBULATORY_CARE_PROVIDER_SITE_OTHER): Payer: PPO | Admitting: Family Medicine

## 2017-09-13 ENCOUNTER — Encounter: Payer: Self-pay | Admitting: Family Medicine

## 2017-09-13 VITALS — BP 126/80 | HR 55 | Temp 98.3°F | Resp 16 | Ht 62.0 in | Wt 146.5 lb

## 2017-09-13 DIAGNOSIS — I10 Essential (primary) hypertension: Secondary | ICD-10-CM | POA: Insufficient documentation

## 2017-09-13 DIAGNOSIS — F419 Anxiety disorder, unspecified: Secondary | ICD-10-CM | POA: Diagnosis not present

## 2017-09-13 DIAGNOSIS — F17200 Nicotine dependence, unspecified, uncomplicated: Secondary | ICD-10-CM | POA: Diagnosis not present

## 2017-09-13 DIAGNOSIS — I480 Paroxysmal atrial fibrillation: Secondary | ICD-10-CM

## 2017-09-13 DIAGNOSIS — N3946 Mixed incontinence: Secondary | ICD-10-CM

## 2017-09-13 DIAGNOSIS — E039 Hypothyroidism, unspecified: Secondary | ICD-10-CM | POA: Diagnosis not present

## 2017-09-13 NOTE — Patient Instructions (Addendum)
A few things to remember from today's visit:   Urgency incontinence  Tobacco use disorder  Anxiety  PAF (paroxysmal atrial fibrillation) (H[facility])  This exercise helps with mild urine leakage associated with cough, laughing, or sneezing. It may help with other types of urine incontinence and even with mild fecal incontinence.   Tighten and relax the pelvic muscles intermittently during the day. Once you are familiar with exercise try to hold pelvic muscles contraction for about 8-10 seconds.in the beginning you may not be able to hold contraction for more than a second or 2 but eventually you will be able to hold contraction harder and for longer time. Perform  8-12 exercises 3 times per day and daily for 15-20 weeks. You will need to continue exercises indefinitely to have a lasting effect.   Monitor pulse periodically.  No changes in meds today.   Please be sure medication list is accurate. If a new problem present, please set up appointment sooner than planned today.

## 2017-09-13 NOTE — Progress Notes (Signed)
HPI:   [name] is a 72 y.o. female, who is here today to establish care.  Former PCP: Dr. Burnice Logan. Last preventive routine visit: 10/2016.  Chronic medical problems: Hypertension, hypothyroidism, atria fib,anxiety, urine incontinence, and allergic rhinitis among some. ? OSA. She did not tolerate CPAP machine, so she did not go for appropriate fitting. Mild fatigue in the morning but better when she is moving around. If she sits for a few min to watch TV she falls asleep.   Concerns today: Refill on some of her meds.  Anxiety: Currently she is on Xanax 0.5 mg daily as needed. She has taken medications for years. She takes 1/2 tab of Xanax at the time. Problem aggravated by stress, mainly when dealing with her son in law.  She denies depressed mood or suicidal thoughts.  She lives with her husband.  + Tobacco use.  Hypertension:   Currently on Metoprolol Succinate 25 mg daily and Losartan 25 mg daily. Hx of atrial fib, she is on aspirin 81 mg.  Noted mild bradycardia. She states that every time she tried to stop Metoprolol she has palpitations,"jumping" sensation in chest.   She is taking medications as instructed, no side effects reported.  She has not noted unusual headache, visual changes, exertional chest pain, dyspnea,  focal weakness, or edema.   Lab Results  Component Value Date   CREATININE 0.68 03/21/2017   BUN 12 03/21/2017   NA 140 03/21/2017   K 4.2 03/21/2017   CL 105 03/21/2017   CO2 29 03/21/2017   She takes Ibuprofen 600 mg bid pr for leg pain, chronic.Exacerbated by prolonged standing and walking. No edema or erythema.  She has Mobic on med list, she has not taken it but would like to keep it on her list in case she needs it.  Urine leakage:  She has had problem for years. Urine leakage exacerbated by coughing and with some activities (getting up,sitting). She is on Myrbetriq 25 mg , which helped with urgency and  frequency.   She also follows with gyn. Denies dysuria,increased urinary frequency, gross hematuria,or decreased urine output.   Hypothyroidism on Levothyroxine 75 mcg daily.  Lab Results  Component Value Date   TSH 0.59 03/21/2017   No abnormal wt loss,cold/heat intolerance,tremor,or changes in bowel habits.   Review of Systems  Constitutional: Negative for activity change, appetite change, fatigue and fever.  HENT: Negative for mouth sores, nosebleeds, sore throat and trouble swallowing.   Eyes: Negative for redness and visual disturbance.  Respiratory: Negative for cough, shortness of breath and wheezing.   Cardiovascular: Negative for chest pain, palpitations and leg swelling.  Gastrointestinal: Negative for abdominal pain, nausea and vomiting.       Negative for changes in bowel habits.  Endocrine: Negative for cold intolerance and heat intolerance.  Genitourinary: Negative for decreased urine volume, dysuria and hematuria.  Musculoskeletal: Positive for arthralgias and myalgias. Negative for gait problem.  Skin: Negative for rash and wound.  Neurological: Negative for syncope, weakness, numbness and headaches.  Psychiatric/Behavioral: Negative for confusion. The patient is nervous/anxious.       Current Outpatient Medications on File Prior to Visit  Medication Sig Dispense Refill  . ALPRAZolam (XANAX) 0.5 MG tablet TAKE ONE TABLET BY MOUTH ONCE DAILY AS NEEDED 90 tablet 3  . aspirin 81 MG tablet Take 81 mg by mouth daily.      . Calcium Carbonate (CALTRATE 600) 1500 MG TABS Take by mouth.      Marland Kitchen  cholecalciferol (VITAMIN D) 1000 UNITS tablet Take 1,000 Units by mouth daily.     . clobetasol cream (TEMOVATE) 0.05 % Apply twice a week first week then once weekly as needed 30 g 2  . fluticasone (CUTIVATE) 0.05 % cream Apply 1 application topically 2 (two) times daily.     . fluticasone (FLONASE) 50 MCG/ACT nasal spray Use 2 sprays in each nostril daily 48 g 2  .  levothyroxine (SYNTHROID, LEVOTHROID) 75 MCG tablet Take 1 tablet by mouth daily 90 tablet 2  . meloxicam (MOBIC) 15 MG tablet Take 1 tablet (15 mg total) by mouth daily. 30 tablet 0  . mirabegron ER (MYRBETRIQ) 25 MG TB24 tablet Take 1 tablet (25 mg total) by mouth daily. 90 tablet 2  . Multiple Vitamins-Minerals (MULTIVITAMIN WITH MINERALS) tablet Take 1 tablet by mouth daily.      . Omega-3 Fatty Acids (FISH OIL) 1000 MG CAPS Take 2 capsules by mouth 2 (two) times daily.    . simvastatin (ZOCOR) 20 MG tablet Take 1 tablet (20 mg total) by mouth at bedtime. 90 tablet 0  . tretinoin (RETIN-A) 0.025 % cream Apply topically at bedtime.     . vitamin C (ASCORBIC ACID) 500 MG tablet Take 500 mg by mouth daily.      Marland Kitchen losartan (COZAAR) 25 MG tablet Take 1 tablet (25 mg total) by mouth daily. 90 tablet 3   No current facility-administered medications on file prior to visit.      Past Medical History:  Diagnosis Date  . Allergy    SEASONAL  . Cataract    BILATERAL  . Fibromyalgia   . GERD (gastroesophageal reflux disease)   . H/O echocardiogram 04/28/08   EF>55% trace mitral regurgitation, No significant valvular pathology  . Hemorrhoids    internal  . History of stress test 02/08/2011   Normal Myocardial perfusion study, this is a low risk scan, No prior study available for comparison  . Hyperlipidemia   . MVP (mitral valve prolapse)    mild and MR  . OA (osteoarthritis)   . Osteoporosis   . Paroxysmal A-fib ([place])   . Thyroid disease   . Varicosities of leg   . Vitamin D deficiency    Allergies  Allergen Reactions  . Tramadol Hcl     REACTION: jittery    Family History  Problem Relation Age of Onset  . Diabetes Daughter   . Diabetes Mother   . Hypertension Mother   . Heart disease Mother   . Stroke Maternal Grandmother   . Colon cancer Neg Hx     Social History   Socioeconomic History  . Marital status: Married    Spouse name: Not on file  . Number of children:  Not on file  . Years of education: Not on file  . Highest education level: Not on file  Occupational History  . Not on file  Social Needs  . Financial resource strain: Not on file  . Food insecurity:    Worry: Not on file    Inability: Not on file  . Transportation needs:    Medical: Not on file    Non-medical: Not on file  Tobacco Use  . Smoking status: Current Every Day Smoker    Types: Cigars  . Smokeless tobacco: Never Used  . Tobacco comment: peach flavor   Substance and Sexual Activity  . Alcohol use: No    Alcohol/week: 0.0 standard drinks  . Drug use: No  . Sexual  activity: Yes    Comment: 1st intercourse 17yo-1 partner  Lifestyle  . Physical activity:    Days per week: Not on file    Minutes per session: Not on file  . Stress: Not on file  Relationships  . Social connections:    Talks on phone: Not on file    Gets together: Not on file    Attends religious service: Not on file    Active member of club or organization: Not on file    Attends meetings of clubs or organizations: Not on file    Relationship status: Not on file  Other Topics Concern  . Not on file  Social History Narrative  . Not on file    Vitals:   09/13/17 1026  BP: 126/80  Pulse: (!) 55  Resp: 16  Temp: 98.3 F (36.8 C)  SpO2: 97%    Body mass index is 26.8 kg/m.   Physical Exam  Nursing note and vitals reviewed. Constitutional: She is oriented to person, place, and time. She appears well-developed and well-nourished. No distress.  HENT:  Head: Normocephalic and atraumatic.  Mouth/Throat: Oropharynx is clear and moist and mucous membranes are normal.  Eyes: Pupils are equal, round, and reactive to light. Conjunctivae are normal.  Cardiovascular: Regular rhythm. Bradycardia present.  No murmur heard. Pulses:      Dorsalis pedis pulses are 2+ on the right side, and 2+ on the left side.  Respiratory: Effort normal and breath sounds normal. No respiratory distress.  GI: Soft.  She exhibits no mass. There is no hepatomegaly. There is no tenderness.  Musculoskeletal: She exhibits no edema.       Right lower leg: She exhibits no tenderness and no edema.       Left lower leg: She exhibits no tenderness and no edema.  Lymphadenopathy:    She has no cervical adenopathy.  Neurological: She is alert and oriented to person, place, and time. She has normal strength. No cranial nerve deficit. Gait normal.  Skin: Skin is warm. No rash noted. No erythema.  Psychiatric: Her mood appears anxious.  Well groomed, poor eye contact.      ASSESSMENT AND PLAN:  Ms. Kelle was seen today for transfer of care.  Diagnoses and all orders for this visit:  Mixed stress and urge urinary incontinence  Urgency incontinence improved with Myrbetriq. Treatment options for stress incontinence discussed. Kegel and pelvic floor exercises recommended. She also follows with gyn. No changes in Myrbetriq dose.   Tobacco use disorder  She states that she smokes cigars and does not inhale it.So she doe snot think it is a problem and not interested in smoking cessation. Adverse effects of tobacco use,encouraged to quit.   Essential hypertension, benign  Adequately controlled. Side effects of Metoprolol discussed. Because it is helping with palpitations she is not interested in decreasing dose due to bradycardia.So no changes in current management. Caution advised with NSAID's. DASH-low salt  diet recommended. Eye exam recommended annually.  -     metoprolol succinate (TOPROL-XL) 25 MG 24 hr tablet; Take 1 tablet (25 mg total) by mouth daily.  Anxiety  Well controlled. She is not interested in a different anxiolytic medication. Side effects of Xanax discussed.  PAF (paroxysmal atrial fibrillation) (HCC)  Rate and rhythm controlled. She is not on anticoagulation. Continue Aspirin and Metoprolol. Follows annually with Dr Claiborne Billings.  -     metoprolol succinate (TOPROL-XL) 25 MG 24 hr  tablet; Take 1 tablet (25 mg total)  by mouth daily.  Acquired hypothyroidism  Well controlled. Will plan on TSH next visit. No changes in Levothyroxine dose.    Aurie G. Martinique, MD  [facility]. [place] office.

## 2017-09-17 MED ORDER — METOPROLOL SUCCINATE ER 25 MG PO TB24: 25.0000 mg | ORAL_TABLET | Freq: Every day | ORAL | 1 refills | Status: DC

## 2017-09-26 ENCOUNTER — Encounter: Payer: Self-pay | Admitting: Family Medicine

## 2017-09-26 ENCOUNTER — Ambulatory Visit (INDEPENDENT_AMBULATORY_CARE_PROVIDER_SITE_OTHER): Payer: PPO | Admitting: Family Medicine

## 2017-09-26 VITALS — BP 124/76 | HR 60 | Temp 98.7°F | Resp 12 | Ht 62.0 in | Wt 145.5 lb

## 2017-09-26 DIAGNOSIS — K219 Gastro-esophageal reflux disease without esophagitis: Secondary | ICD-10-CM

## 2017-09-26 DIAGNOSIS — R8290 Unspecified abnormal findings in urine: Secondary | ICD-10-CM

## 2017-09-26 DIAGNOSIS — F17200 Nicotine dependence, unspecified, uncomplicated: Secondary | ICD-10-CM

## 2017-09-26 DIAGNOSIS — N3941 Urge incontinence: Secondary | ICD-10-CM | POA: Diagnosis not present

## 2017-09-26 LAB — POC URINALSYSI DIPSTICK (AUTOMATED)
BILIRUBIN UA: NEGATIVE
GLUCOSE UA: NEGATIVE
KETONES UA: NEGATIVE
Leukocytes, UA: NEGATIVE
Nitrite, UA: NEGATIVE
Protein, UA: NEGATIVE
RBC UA: NEGATIVE
Spec Grav, UA: 1.01 (ref 1.010–1.025)
Urobilinogen, UA: 0.2 E.U./dL
pH, UA: 7 (ref 5.0–8.0)

## 2017-09-26 MED ORDER — OMEPRAZOLE 40 MG PO CPDR: 40.0000 mg | DELAYED_RELEASE_CAPSULE | Freq: Every day | ORAL | 1 refills | Status: DC

## 2017-09-26 NOTE — Assessment & Plan Note (Signed)
She is not interested in pharmacologic treatment. Appointment with urologist will be arranged. Continue Keagle exercises as instructed.

## 2017-09-26 NOTE — Assessment & Plan Note (Signed)
Educated about adverse effects of tobacco use. Encouraged smoking cessation.

## 2017-09-26 NOTE — Progress Notes (Signed)
ACUTE VISIT   HPI:  Chief Complaint  Patient presents with  . Gastroesophageal Reflux    getting worse    Haley [name] is a 72 y.o. female, who is here today complaining of chest burning sensation and heartburn. She has history of GERD, she was on pantoprazole a few years ago, discontinued 2 years ago because it caused flatulence. She states that she has a gastroenterologist.  Symptoms seem worse at night when lying down, "bad" burning chest sensation and heartburn.  She has to get up a few times during the night to drink small sips of water, which helps some.  Associated mild nausea, she denies vomiting or dysphasia. Upper abdominal pain, "growling stomach."  She has not identified exacerbating factors. OTC Mylanta has helped.  Symptoms are getting worse.  Upon records review, she had negative EGD in 09/2014, it was done due to halitosis and heartburn.  She does not recall having this done. She smokes cigars, states that she does not inhale it, so she does not think this is aggravating problem.  She is also concerned about "bad urine odor" for about a year. Denies dysuria,increased urinary frequency, gross hematuria,or decreased urine output. She states that she is very knowledgeable about "medical research", she thinks that she should have further work-up. Problem has been unchanged. She has not identified exacerbating or alleviating factors.  History of mixed urine incontinence, constant urine leakage.  She dicontinued Myrbetriq because it was causing headaches, she was not feeling well while she was taking medication. She would like a referral to urologist.  She also wants to clarify that her cardiologist will be filling her prescriptions for metoprolol and losartan. Dermatologist will continue topical steroid and retinol. I will be prescribing her levothyroxine and her Xanax.   Review of Systems  Constitutional: Negative for activity change, appetite  change, fatigue, fever and unexpected weight change.  HENT: Negative for mouth sores, sore throat and trouble swallowing.   Respiratory: Positive for cough (occasional). Negative for chest tightness, shortness of breath and wheezing.   Cardiovascular: Negative for palpitations and leg swelling.  Gastrointestinal: Positive for abdominal distention (Bloating sensation), abdominal pain and nausea. Negative for blood in stool, constipation and vomiting.  Genitourinary: Negative for decreased urine volume, dysuria, hematuria, vaginal bleeding and vaginal discharge.  Musculoskeletal: Negative for back pain and myalgias.  Psychiatric/Behavioral: Positive for sleep disturbance. Negative for confusion. The patient is nervous/anxious.     Current Outpatient Medications on File Prior to Visit  Medication Sig Dispense Refill  . ALPRAZolam (XANAX) 0.5 MG tablet TAKE ONE TABLET BY MOUTH ONCE DAILY AS NEEDED 90 tablet 3  . aspirin 81 MG tablet Take 81 mg by mouth daily.      . Calcium Carbonate (CALTRATE 600) 1500 MG TABS Take by mouth.      . cholecalciferol (VITAMIN D) 1000 UNITS tablet Take 1,000 Units by mouth daily.     . clobetasol cream (TEMOVATE) 0.05 % Apply twice a week first week then once weekly as needed 30 g 2  . fluticasone (CUTIVATE) 0.05 % cream Apply 1 application topically 2 (two) times daily.     . fluticasone (FLONASE) 50 MCG/ACT nasal spray Use 2 sprays in each nostril daily 48 g 2  . levothyroxine (SYNTHROID, LEVOTHROID) 75 MCG tablet Take 1 tablet by mouth daily 90 tablet 2  . meloxicam (MOBIC) 15 MG tablet Take 1 tablet (15 mg total) by mouth daily. 30 tablet 0  . metoprolol succinate (  TOPROL-XL) 25 MG 24 hr tablet Take 1 tablet (25 mg total) by mouth daily. 30 tablet 1  . Multiple Vitamins-Minerals (MULTIVITAMIN WITH MINERALS) tablet Take 1 tablet by mouth daily.      . Omega-3 Fatty Acids (FISH OIL) 1000 MG CAPS Take 2 capsules by mouth 2 (two) times daily.    . simvastatin  (ZOCOR) 20 MG tablet Take 1 tablet (20 mg total) by mouth at bedtime. 90 tablet 0  . tretinoin (RETIN-A) 0.025 % cream Apply topically at bedtime.     . vitamin C (ASCORBIC ACID) 500 MG tablet Take 500 mg by mouth 2 (two) times daily.     Marland Kitchen losartan (COZAAR) 25 MG tablet Take 1 tablet (25 mg total) by mouth daily. 90 tablet 3   No current facility-administered medications on file prior to visit.      Past Medical History:  Diagnosis Date  . Allergy    SEASONAL  . Cataract    BILATERAL  . Fibromyalgia   . GERD (gastroesophageal reflux disease)   . H/O echocardiogram 04/28/08   EF>55% trace mitral regurgitation, No significant valvular pathology  . Hemorrhoids    internal  . History of stress test 02/08/2011   Normal Myocardial perfusion study, this is a low risk scan, No prior study available for comparison  . Hyperlipidemia   . MVP (mitral valve prolapse)    mild and MR  . OA (osteoarthritis)   . Osteoporosis   . Paroxysmal A-fib ([place])   . Thyroid disease   . Varicosities of leg   . Vitamin D deficiency    Past Surgical History:  Procedure Laterality Date  . ABDOMINAL HYSTERECTOMY  1989  . APPENDECTOMY  1989   with hysterectomy  . BREAST EXCISIONAL BIOPSY Left 1995   Benign  . COLONOSCOPY  11-20-2000  . NOSE SURGERY  1990    Allergies  Allergen Reactions  . Tramadol Hcl     REACTION: jittery  . Tramadol Nausea And Vomiting   Family History  Problem Relation Age of Onset  . Diabetes Daughter   . Diabetes Mother   . Hypertension Mother   . Heart disease Mother   . Stroke Maternal Grandmother   . Colon cancer Neg Hx     Social History   Socioeconomic History  . Marital status: Married    Spouse name: Not on file  . Number of children: Not on file  . Years of education: Not on file  . Highest education level: Not on file  Occupational History  . Not on file  Social Needs  . Financial resource strain: Not on file  . Food insecurity:    Worry: Not on  file    Inability: Not on file  . Transportation needs:    Medical: Not on file    Non-medical: Not on file  Tobacco Use  . Smoking status: Current Every Day Smoker    Types: Cigars  . Smokeless tobacco: Never Used  . Tobacco comment: peach flavor   Substance and Sexual Activity  . Alcohol use: No    Alcohol/week: 0.0 standard drinks  . Drug use: No  . Sexual activity: Yes    Comment: 1st intercourse 17yo-1 partner  Lifestyle  . Physical activity:    Days per week: Not on file    Minutes per session: Not on file  . Stress: Not on file  Relationships  . Social connections:    Talks on phone: Not on file  Gets together: Not on file    Attends religious service: Not on file    Active member of club or organization: Not on file    Attends meetings of clubs or organizations: Not on file    Relationship status: Not on file  Other Topics Concern  . Not on file  Social History Narrative  . Not on file    Vitals:   09/26/17 1008  BP: 124/76  Pulse: 60  Resp: 12  Temp: 98.7 F (37.1 C)  SpO2: 97%   Body mass index is 26.61 kg/m.   Physical Exam  Nursing note and vitals reviewed. Constitutional: She is oriented to person, place, and time. She appears well-developed and well-nourished. No distress.  HENT:  Head: Normocephalic and atraumatic.  Mouth/Throat: Oropharynx is clear and moist and mucous membranes are normal.  Eyes: Pupils are equal, round, and reactive to light. Conjunctivae are normal.  Cardiovascular: Normal rate and regular rhythm.  No murmur heard. Pulses:      Dorsalis pedis pulses are 2+ on the right side, and 2+ on the left side.  Respiratory: Effort normal and breath sounds normal. No respiratory distress.  GI: Soft. She exhibits no mass. There is no hepatomegaly. There is no tenderness. There is no CVA tenderness.  Musculoskeletal: She exhibits no edema.  Lymphadenopathy:    She has no cervical adenopathy.  Neurological: She is alert and  oriented to person, place, and time. She has normal strength. Gait normal.  Skin: Skin is warm. No rash noted. No erythema.  Psychiatric: She has a normal mood and affect.  Well groomed, poor eye contact.    ASSESSMENT AND PLAN:   Ms. Braeden was seen today for gastroesophageal reflux.  Orders Placed This Encounter  Procedures  . Ambulatory referral to Urology  . POCT Urinalysis Dipstick (Automated)     Bad odor of urine  I tried to reassured, I does not seem to be related to a serious process. Urine dipstick today otherwise negative. Referral to urologist was placed as requested.  -     POCT Urinalysis Dipstick (Automated) -     Ambulatory referral to Urology  GERD After discussion of treatment options, she agrees with trying omeprazole 40 mg for 3 to 4 weeks. We will try to decrease dose of omeprazole to 20 mg. GERD precautions discussed. Encouraged smoking cessation. If not better she may need to follow-up with her GI.  Urgency incontinence She is not interested in pharmacologic treatment. Appointment with urologist will be arranged. Continue Keagle exercises as instructed.  Tobacco use disorder Educated about adverse effects of tobacco use. Encouraged smoking cessation.    Return in about 6 weeks (around 11/07/2017) for GERD.   40 min face to face OV. > 50% was dedicated to discussion of differential dx, prognosis, treatment options, and some side effects of medications. Some side effects of PPIs discussed. We discussed possible etiologies of odorous urine. She is very concerned about these, she states that she has done a "little research in the past in the medical field" and she is certain "something is not right."      Joi G. Martinique, MD  [facility]. [place] office.

## 2017-09-26 NOTE — Assessment & Plan Note (Signed)
After discussion of treatment options, she agrees with trying omeprazole 40 mg for 3 to 4 weeks. We will try to decrease dose of omeprazole to 20 mg. GERD precautions discussed. Encouraged smoking cessation. If not better she may need to follow-up with her GI.

## 2017-09-26 NOTE — Patient Instructions (Addendum)
A few things to remember from today's visit:   Bad odor of urine - Plan: POCT Urinalysis Dipstick (Automated), Ambulatory referral to Urology  Gastroesophageal reflux disease, esophagitis presence not specified - Plan: omeprazole (PRILOSEC) 40 MG capsule  Urgency incontinence   Please be sure medication list is accurate. If a new problem present, please set up appointment sooner than planned today.    So as we discussed today I will be managing your Xanax and your thyroid medication. You continue following with a dermatologist and cardiologist.

## 2017-10-04 ENCOUNTER — Ambulatory Visit: Payer: PPO | Admitting: Cardiovascular Disease

## 2017-10-04 ENCOUNTER — Encounter: Payer: Self-pay | Admitting: Cardiovascular Disease

## 2017-10-04 VITALS — BP 138/74 | HR 52 | Ht 62.0 in | Wt 144.2 lb

## 2017-10-04 DIAGNOSIS — E039 Hypothyroidism, unspecified: Secondary | ICD-10-CM | POA: Diagnosis not present

## 2017-10-04 DIAGNOSIS — I480 Paroxysmal atrial fibrillation: Secondary | ICD-10-CM | POA: Diagnosis not present

## 2017-10-04 DIAGNOSIS — G4733 Obstructive sleep apnea (adult) (pediatric): Secondary | ICD-10-CM | POA: Diagnosis not present

## 2017-10-04 DIAGNOSIS — I10 Essential (primary) hypertension: Secondary | ICD-10-CM | POA: Diagnosis not present

## 2017-10-04 NOTE — Patient Instructions (Signed)
Medication Instructions:  Your physician recommends that you continue on your current medications as directed. Please refer to the Current Medication list given to you today.  Follow-Up: You have been referred to Dr. Oneal Grout to discuss oral appliance  Your physician wants you to follow-up in: 6 months with Dr. Claiborne Billings.  You will receive a reminder letter in the mail two months in advance. If you don't receive a letter, please call our office to schedule the follow-up appointment.    Any Other Special Instructions Will Be Listed Below (If Applicable).     If you need a refill on your cardiac medications before your next appointment, please call your pharmacy.

## 2017-10-04 NOTE — Progress Notes (Signed)
Patient ID: Haley [name], female   DOB: Nov 15, 1945, 72 y.o.   MRN: [id number]    PCP: Dr. Bluford Kaufmann  HPI: Haley [name] is a 72 y.o. female presents to the office today for a 4 month followup evaluation.  Ms. Odonnell  has a history of palpitations, paroxysmal atrial fibrillation, hyperlipidemia, GERD, and hypothyroidism. She has been found to have an atherogenic dyslipidemia lipid panel. She also has a history of lower extremity varicosities and wears support stockings. Remotely she was having difficulty remembering to take her metoprolol on a twice a day regimen at that time I switched her to metoprolol succinate Toprol-XL 25 mg daily. She has felt markedly improved with reference to taking this once a day preparation. She is unaware of any breakthrough tachycardia palpitations. She denies presyncope or syncope. An NMR lipoprofile  showed marked improvement with a cholesterol of 174, triglycerides 119, HDL C. cholesterol 60, and LDL cholesterol at 90. Her LDL particle number was 1020 and her HDL particle number was 39.5. Her insulin resistance score was 39 which was normal.   Laboratory last year showed a total cholesterol 165, triglycerides 100, HDL 54, LDL 91.  TSH was 2.03.  She was not anemic.  She normal chemistry profile.  Since I last saw her, she denies any episodes of chest pain or significant palpitations.  She admits to occasional leg cramps.  She also at times note some very mild left ankle swelling.  She has had issues with bladder incontinence and sees Dr. Toney Rakes. She has been taking Toprol 25 mg daily.  She is tolerated simvastatin 20 mg for hyperlipidemia.  She is on 75 g of levothyroxine for hypothyroidism.  She also has osteopenia for which she takes Fosamax.    When I last saw her, she denied any episodes of chest pain.  .  She had occasional leg cramps.  She has had issues with bladder incontinence for which she had seen Dr. Toney Rakes in the past.  She admits to  intermittent right leg discomfort.  She tells me that her husband has witnessed apneic spells at night.  She has a history of snoring.  .  She has a history of hypothyroidism on Synthroid replacement at 75 g.  She has been taking simvastatin 20 g hyperlipidemia.  She underwent a home sleep study for evaluation of sleep apnea  on 01/22/2017.  She was found to have moderate sleep apnea overall with an AHI of 14.7/h.  There was a significant positional component with supine sleep HI 20.7 versus nonsupine sleep HI.  89.5 per hour.  Since this was a home study, no data is available regarding AHI during REM sleep.  She had oxygen desaturation to a nadir of 82%.  She was referred for a CPAP titration study which was done on 03/16/2017.  This study was inadequate due to the patient's very poor sleep.  Her sleep efficiency was only 29%.  There was no snoring.  The patient was tried on multiple masks and could not tolerate anyone.  She did not seem interested in pursuing CPAP.    I last saw her in May 2019.  At that time I had a long discussion regarding alternatives to CPAP therapy including a customized oral appliance and I also suggested an ENT evaluation.  She was evaluated by Dr. Jerrell Belfast.  She was not found to have significant airway abnormality or unexpected area of obstruction and it was felt that her COPD was most likely related to  the base of her tongue.  She would not be a candidate for the inspire implant until she at least attempted to wear CPAP at home.  She also was given the name to see Dr. Ron Parker for consideration of an oral appliance which she has not yet done.  When last seen I also further initiated amlodipine since her blood pressure was elevated apparently she had stopped her losartan.  She presents for evaluation.  Past Medical History:  Diagnosis Date  . Allergy    SEASONAL  . Cataract    BILATERAL  . Fibromyalgia   . GERD (gastroesophageal reflux disease)   . H/O echocardiogram  04/28/08   EF>55% trace mitral regurgitation, No significant valvular pathology  . Hemorrhoids    internal  . History of stress test 02/08/2011   Normal Myocardial perfusion study, this is a low risk scan, No prior study available for comparison  . Hyperlipidemia   . MVP (mitral valve prolapse)    mild and MR  . OA (osteoarthritis)   . Osteoporosis   . Paroxysmal A-fib ([place])   . Thyroid disease   . Varicosities of leg   . Vitamin D deficiency     Past Surgical History:  Procedure Laterality Date  . ABDOMINAL HYSTERECTOMY  1989  . APPENDECTOMY  1989   with hysterectomy  . BREAST EXCISIONAL BIOPSY Left 1995   Benign  . COLONOSCOPY  11-20-2000  . NOSE SURGERY  1990    Allergies  Allergen Reactions  . Tramadol Hcl     REACTION: jittery  . Tramadol Nausea And Vomiting    Current Outpatient Medications  Medication Sig Dispense Refill  . ALPRAZolam (XANAX) 0.5 MG tablet TAKE ONE TABLET BY MOUTH ONCE DAILY AS NEEDED 90 tablet 3  . aspirin 81 MG tablet Take 81 mg by mouth daily.      . Calcium Carbonate (CALTRATE 600) 1500 MG TABS Take by mouth.      . cholecalciferol (VITAMIN D) 1000 UNITS tablet Take 1,000 Units by mouth daily.     . clobetasol cream (TEMOVATE) 0.05 % Apply twice a week first week then once weekly as needed 30 g 2  . fluticasone (CUTIVATE) 0.05 % cream Apply 1 application topically 2 (two) times daily.     . fluticasone (FLONASE) 50 MCG/ACT nasal spray Use 2 sprays in each nostril daily 48 g 2  . levothyroxine (SYNTHROID, LEVOTHROID) 75 MCG tablet Take 1 tablet by mouth daily 90 tablet 2  . meloxicam (MOBIC) 15 MG tablet Take 1 tablet (15 mg total) by mouth daily. 30 tablet 0  . metoprolol succinate (TOPROL-XL) 25 MG 24 hr tablet Take 1 tablet (25 mg total) by mouth daily. 30 tablet 1  . Multiple Vitamins-Minerals (MULTIVITAMIN WITH MINERALS) tablet Take 1 tablet by mouth daily.      . Omega-3 Fatty Acids (FISH OIL) 1000 MG CAPS Take 2 capsules by mouth 2  (two) times daily.    Marland Kitchen omeprazole (PRILOSEC) 40 MG capsule Take 1 capsule (40 mg total) by mouth daily. 30 capsule 1  . simvastatin (ZOCOR) 20 MG tablet Take 1 tablet (20 mg total) by mouth at bedtime. 90 tablet 0  . tretinoin (RETIN-A) 0.025 % cream Apply topically at bedtime.     . vitamin C (ASCORBIC ACID) 500 MG tablet Take 500 mg by mouth 2 (two) times daily.     Marland Kitchen losartan (COZAAR) 25 MG tablet Take 1 tablet (25 mg total) by mouth daily. 90 tablet 3  No current facility-administered medications for this visit.     Socially she is married and has 2 children and 4 grandchildren with one great grandchild. She smokes little cigars. [facility].  ROS General: Negative; No fevers, chills, or night sweats;  HEENT: Negative; No changes in vision or hearing, sinus congestion, difficulty swallowing , history of a broken nose during childhood, status post surgery 1990 Pulmonary: Negative; No cough, wheezing, shortness of breath, hemoptysis Cardiovascular: Negative; No chest pain, presyncope, syncope, palpitations; occasional left ankle swelling GI: Negative; No nausea, vomiting, diarrhea, or abdominal pain GU: Negative; No dysuria, hematuria, or difficulty voiding Musculoskeletal:Positive for right leg discomfort; no myalgias, joint pain, or weakness Hematologic/Oncology: Negative; no easy bruising, bleeding Endocrine: Positive for hypothyroidism, on Synthroid e; no diabetes Neuro: Negative; no changes in balance, headaches Skin: Negative; No rashes or skin lesions Psychiatric: Positive for anxiety Sleep: Positive for witnessed apnea and snoring.  She denies  daytime sleepiness, hypersomnolence, bruxism, restless legs, hypnogognic hallucinations, no cataplexy Other comprehensive 14 point system review is negative.   PE BP 138/74   Pulse (!) 52   Ht _0  (1.575 m)   Wt 144 lb 3.2 oz (65.4 kg)   BMI 26.37 kg/m    Repeat blood  pressure by me was 124/74  Wt Readings from Last 3 Encounters:  10/04/17 144 lb 3.2 oz (65.4 kg)  09/26/17 145 lb 8 oz (66 kg)  09/13/17 146 lb 8 oz (66.5 kg)   General: Alert, oriented, no distress.  Skin: normal turgor, no rashes, warm and dry HEENT: Normocephalic, atraumatic. Pupils equal round and reactive to light; sclera anicteric; extraocular muscles intact;  Nose without nasal septal hypertrophy Mouth/Parynx benign; Mallinpatti scale 3 Neck: No JVD, no carotid bruits; normal carotid upstroke Lungs: clear to ausculatation and percussion; no wheezing or rales Chest wall: without tenderness to palpitation Heart: PMI not displaced, RRR, s1 s2 normal, 1/6 systolic murmur, no diastolic murmur, no rubs, gallops, thrills, or heaves Abdomen: soft, nontender; no hepatosplenomehaly, BS+; abdominal aorta nontender and not dilated by palpation. Back: no CVA tenderness Pulses 2+ Musculoskeletal: full range of motion, normal strength, no joint deformities Extremities: no clubbing cyanosis or edema, Homan's sign negative  Neurologic: grossly nonfocal; Cranial nerves grossly wnl Psychologic: Normal mood and affect  ECG (independently read by me): Sinus bradycardia 52 bpm.  No ectopy.  May 2019 ECG (independently read by me): Sinus bradycardia at 47 bpm.  No ectopy.  Normal intervals.  February 2019 ECG (independently read by me): Sinus bradycardia 54 bpm.  QS complex V1 V2.  Normal intervals  October 2018 ECG (independently read by me): Sinus bradycardia 56 bpm.  QS V1, V2.  Normal intervals.  November 2017 ECG (independently read by me): Sinus bradycardia 52 bpm.  QS complex precordially.  November 2016 ECG (independently read by me): Sinus bradycardia 52 bpm.  Normal intervals.  No ST segment changes.  April 2016 ECG (independently read by me): Sinus bradycardia at 54 bpm.  Normal intervals.  No significant ST segment changes  September 2015 ECG (independently read by me): Sinus  bradycardia 52 beats per minute.  QS complex in V1 and V2.  Nonspecific ST change.  QTc interval 398 ms  Prior March 2015 ECG (independently read by me): Sinus bradycardia 51 beats per minute. No ectopy. Normal intervals.  Prior 09/14/2012 ECG: Sinus rhythm at 53 beats per minute. She has poor anterior R-wave progression. Normal intervals  LABS:  BMP Latest Ref Rng & Units 03/21/2017 10/31/2016 11/17/2015  Glucose 70 - 99 mg/dL 86 89 84  BUN 6 - 23 mg/dL _0 Creatinine 0.40 - 1.20 mg/dL 0.68 0.68 0.76  Sodium 135 - 145 mEq/L 140 143 140  Potassium 3.5 - 5.1 mEq/L 4.2 4.0 4.1  Chloride 96 - 112 mEq/L 105 106 106  CO2 19 - 32 mEq/L _1 Calcium 8.4 - 10.5 mg/dL 9.5 9.3 9.3    Hepatic Function Latest Ref Rng & Units 10/31/2016 11/17/2015 04/25/2014  Total Protein 6.0 - 8.3 g/dL 6.5 6.5 6.5  Albumin 3.5 - 5.2 g/dL 4.0 3.8 3.7  AST 0 - 37 U/L _2 ALT 0 - 35 U/L _3 Alk Phosphatase 39 - 117 U/L 64 67 75  Total Bilirubin 0.2 - 1.2 mg/dL 0.4 0.4 0.3  Bilirubin, Direct 0.0 - 0.3 mg/dL - - -    CBC Latest Ref Rng & Units 03/21/2017 10/31/2016 11/17/2015  WBC 4.0 - 10.5 K/uL 8.4 9.0 8.7  Hemoglobin 12.0 - 15.0 g/dL 13.5 13.8 13.4  Hematocrit 36.0 - 46.0 % 40.2 41.5 41.3  Platelets 150.0 - 400.0 K/uL 204.0 219.0 222   Lab Results  Component Value Date   MCV 91.1 03/21/2017   MCV 91.0 10/31/2016   MCV 91.2 11/17/2015    Lab Results  Component Value Date   TSH 0.59 03/21/2017   Lab Results  Component Value Date   HGBA1C 6.0 08/06/2012   BNP No results found for: PROBNP  Lipid Panel     Component Value Date/Time   CHOL 168 10/31/2016 0912   TRIG 135.0 10/31/2016 0912   HDL 56.00 10/31/2016 0912   CHOLHDL 3 10/31/2016 0912   VLDL 27.0 10/31/2016 0912   LDLCALC 85 10/31/2016 0912     RADIOLOGY: No results found.   IMPRESSION:  1. OSA (obstructive sleep apnea)   2. Essential hypertension   3. PAF (paroxysmal atrial fibrillation) ([facility])      ASSESSMENT AND PLAN: Ms. Runde is a 72 year old female who has a history of palpitations that have improved with once a day long-acting beta blocker therapy.  Unaware of any recurrent arrhythmia and is maintaining sinus rhythm with sinus bradycardia.  Her blood pressure today is improved on her current medical regimen.  Target blood pressure is less than 130/80.  I again reviewed her sleep study with her.  On a home study her overall AHI was 14.7 and with supine sleep AHI was 20.7/h.  Had significant oxygen desaturation to a nadir of 82%.  I reviewed her ENT evaluation by Dr. Jerrell Belfast.  Since she has not technically tried CPAP or fail therapy she is not yet candidate for the inspire implant.  I again discussed potential benefit with a customized oral appliance.  I referred her to Dr. Oneal Grout for further evaluation.  He continues to be on levothyroxine for hypothyroidism.  She is tolerating simvastatin 20 mg for hyperlipidemia.  Her last LDL in October 2018 was 85.  I will see her in 6 months for reevaluation.   Time Spent: 25 minutes Troy Sine, MD, [facility]  10/04/2017 1:52 PM

## 2017-10-10 ENCOUNTER — Encounter: Payer: Self-pay | Admitting: Cardiovascular Disease

## 2017-10-11 ENCOUNTER — Telehealth: Payer: Self-pay | Admitting: *Deleted

## 2017-10-11 NOTE — Telephone Encounter (Signed)
Faxed referral to Dr Ron Parker for oral appliance evaluation.

## 2017-10-11 NOTE — Telephone Encounter (Signed)
-----   Message from Silverio Lay, RN sent at 10/04/2017  2:01 PM EDT ----- Regarding: Dr. Ron Parker Refer to Dr. Ron Parker  Thanks!

## 2017-11-03 ENCOUNTER — Ambulatory Visit (INDEPENDENT_AMBULATORY_CARE_PROVIDER_SITE_OTHER): Payer: PPO | Admitting: Family Medicine

## 2017-11-03 ENCOUNTER — Encounter: Payer: Self-pay | Admitting: Family Medicine

## 2017-11-03 VITALS — BP 122/74 | HR 58 | Temp 98.0°F | Resp 12 | Ht 62.0 in | Wt 147.5 lb

## 2017-11-03 DIAGNOSIS — Z23 Encounter for immunization: Secondary | ICD-10-CM | POA: Diagnosis not present

## 2017-11-03 DIAGNOSIS — Z0000 Encounter for general adult medical examination without abnormal findings: Secondary | ICD-10-CM | POA: Diagnosis not present

## 2017-11-03 DIAGNOSIS — E039 Hypothyroidism, unspecified: Secondary | ICD-10-CM

## 2017-11-03 DIAGNOSIS — E785 Hyperlipidemia, unspecified: Secondary | ICD-10-CM

## 2017-11-03 DIAGNOSIS — F17200 Nicotine dependence, unspecified, uncomplicated: Secondary | ICD-10-CM | POA: Diagnosis not present

## 2017-11-03 LAB — LIPID PANEL
CHOLESTEROL: 157 mg/dL (ref 0–200)
HDL: 56 mg/dL (ref >39.00)
LDL Cholesterol: 70 mg/dL (ref 0–99)
NONHDL: 101.03
Total CHOL/HDL Ratio: 3
Triglycerides: 154 mg/dL — ABNORMAL HIGH (ref 0.0–149.0)
VLDL: 30.8 mg/dL (ref 0.0–40.0)

## 2017-11-03 LAB — COMPREHENSIVE METABOLIC PANEL
ALK PHOS: 84 U/L (ref 39–117)
ALT: 13 U/L (ref 0–35)
AST: 16 U/L (ref 0–37)
Albumin: 4.2 g/dL (ref 3.5–5.2)
BILIRUBIN TOTAL: 0.4 mg/dL (ref 0.2–1.2)
BUN: 11 mg/dL (ref 6–23)
CO2: 29 mEq/L (ref 19–32)
CREATININE: 0.69 mg/dL (ref 0.40–1.20)
Calcium: 9.6 mg/dL (ref 8.4–10.5)
Chloride: 104 mEq/L (ref 96–112)
GFR: 88.48 mL/min (ref >60.00)
Glucose, Bld: 82 mg/dL (ref 70–99)
POTASSIUM: 4.1 meq/L (ref 3.5–5.1)
SODIUM: 140 meq/L (ref 135–145)
Total Protein: 7.3 g/dL (ref 6.0–8.3)

## 2017-11-03 LAB — TSH: TSH: 2.06 u[IU]/mL (ref 0.35–4.50)

## 2017-11-03 NOTE — Progress Notes (Signed)
HPI:   [name] is a 72 y.o. female, who is here today for her routine physical.  Last CPE: 10/31/2016  Regular exercise 3 or more time per week: not consistently,she is active at home.  Following a healthy diet: Yes She lives with her husband.  Chronic medical problems: anxiety,HTN,hypothyroidism,GERD,OA,PAF among some.   She follows with cardiologist, Dr. Claiborne Billings. Gynecologist,Dr. Phineas Real. Ophthalmologist, Dr. Gershon Crane.    Immunization History  Administered Date(s) Administered  . Pneumococcal Conjugate-13 11/03/2017  . Pneumococcal Polysaccharide-23 09/10/2009  . Td 10/03/2001  . Zoster 08/13/2013    Mammogram: 12/12/2016, Bi-Rads 1 Colonoscopy: 10/2012 DEXA: 05/2016.  Hep C screening: 10/2016 NR  She has no concerns today.   Hypothyroidism: Currently she is on Levothyroxine 75 mcg daily. Tolerating medication well, no side effects reported. She has not noted dysphagia, palpitations, abdominal pain, changes in bowel habits, tremor, cold/heat intolerance, or abnormal weight loss.  Lab Results  Component Value Date   TSH 0.59 03/21/2017    Hyperlipidemia: Currently on Zocor 20 mg daily. Following a low fat diet: Yes..  She has not noted side effects with medication.  Lab Results  Component Value Date   CHOL 168 10/31/2016   HDL 56.00 10/31/2016   LDLCALC 85 10/31/2016   LDLDIRECT 84.3 12/16/2010   TRIG 135.0 10/31/2016   CHOLHDL 3 10/31/2016   Smoker,cigars.    Review of Systems  Constitutional: Negative for appetite change, fatigue and fever.  HENT: Negative for hearing loss, mouth sores, trouble swallowing and voice change.   Eyes: Negative for redness and visual disturbance.  Respiratory: Negative for cough, shortness of breath and wheezing.   Cardiovascular: Negative for chest pain and leg swelling.  Gastrointestinal: Negative for abdominal pain, nausea and vomiting.       No changes in bowel habits.  Endocrine: Negative  for cold intolerance, heat intolerance, polydipsia, polyphagia and polyuria.  Genitourinary: Negative for decreased urine volume, dysuria, hematuria, vaginal bleeding and vaginal discharge.  Musculoskeletal: Positive for arthralgias. Negative for gait problem.  Skin: Negative for color change and rash.  Allergic/Immunologic: Positive for environmental allergies.  Neurological: Negative for syncope, weakness and headaches.  Psychiatric/Behavioral: Negative for confusion and sleep disturbance. The patient is nervous/anxious.   All other systems reviewed and are negative.     Current Outpatient Medications on File Prior to Visit  Medication Sig Dispense Refill  . ALPRAZolam (XANAX) 0.5 MG tablet TAKE ONE TABLET BY MOUTH ONCE DAILY AS NEEDED 90 tablet 3  . aspirin 81 MG tablet Take 81 mg by mouth daily.      . Calcium Carbonate (CALTRATE 600) 1500 MG TABS Take by mouth.      . cholecalciferol (VITAMIN D) 1000 UNITS tablet Take 1,000 Units by mouth daily.     . clobetasol cream (TEMOVATE) 0.05 % Apply twice a week first week then once weekly as needed 30 g 2  . fluticasone (CUTIVATE) 0.05 % cream Apply 1 application topically 2 (two) times daily.     . fluticasone (FLONASE) 50 MCG/ACT nasal spray Use 2 sprays in each nostril daily 48 g 2  . levothyroxine (SYNTHROID, LEVOTHROID) 75 MCG tablet Take 1 tablet by mouth daily 90 tablet 2  . meloxicam (MOBIC) 15 MG tablet Take 1 tablet (15 mg total) by mouth daily. 30 tablet 0  . metoprolol succinate (TOPROL-XL) 25 MG 24 hr tablet Take 1 tablet (25 mg total) by mouth daily. 30 tablet 1  . Multiple Vitamins-Minerals (MULTIVITAMIN WITH MINERALS)  tablet Take 1 tablet by mouth daily.      . Omega-3 Fatty Acids (FISH OIL) 1000 MG CAPS Take 2 capsules by mouth 2 (two) times daily.    Marland Kitchen omeprazole (PRILOSEC) 40 MG capsule Take 1 capsule (40 mg total) by mouth daily. 30 capsule 1  . simvastatin (ZOCOR) 20 MG tablet Take 1 tablet (20 mg total) by mouth at  bedtime. 90 tablet 0  . tretinoin (RETIN-A) 0.025 % cream Apply topically at bedtime.     . vitamin C (ASCORBIC ACID) 500 MG tablet Take 500 mg by mouth 2 (two) times daily.     Marland Kitchen losartan (COZAAR) 25 MG tablet Take 1 tablet (25 mg total) by mouth daily. 90 tablet 3   No current facility-administered medications on file prior to visit.      Past Medical History:  Diagnosis Date  . Allergy    SEASONAL  . Cataract    BILATERAL  . Fibromyalgia   . GERD (gastroesophageal reflux disease)   . H/O echocardiogram 04/28/08   EF>55% trace mitral regurgitation, No significant valvular pathology  . Hemorrhoids    internal  . History of stress test 02/08/2011   Normal Myocardial perfusion study, this is a low risk scan, No prior study available for comparison  . Hyperlipidemia   . MVP (mitral valve prolapse)    mild and MR  . OA (osteoarthritis)   . Osteoporosis   . Paroxysmal A-fib ([place])   . Thyroid disease   . Varicosities of leg   . Vitamin D deficiency     Past Surgical History:  Procedure Laterality Date  . ABDOMINAL HYSTERECTOMY  1989  . APPENDECTOMY  1989   with hysterectomy  . BREAST EXCISIONAL BIOPSY Left 1995   Benign  . COLONOSCOPY  11-20-2000  . NOSE SURGERY  1990    Allergies  Allergen Reactions  . Tramadol Hcl     REACTION: jittery  . Tramadol Nausea And Vomiting    Family History  Problem Relation Age of Onset  . Diabetes Daughter   . Diabetes Mother   . Hypertension Mother   . Heart disease Mother   . Stroke Maternal Grandmother   . Colon cancer Neg Hx     Social History   Socioeconomic History  . Marital status: Married    Spouse name: Not on file  . Number of children: Not on file  . Years of education: Not on file  . Highest education level: Not on file  Occupational History  . Not on file  Social Needs  . Financial resource strain: Not on file  . Food insecurity:    Worry: Not on file    Inability: Not on file  . Transportation  needs:    Medical: Not on file    Non-medical: Not on file  Tobacco Use  . Smoking status: Current Every Day Smoker    Types: Cigars  . Smokeless tobacco: Never Used  . Tobacco comment: peach flavor   Substance and Sexual Activity  . Alcohol use: No    Alcohol/week: 0.0 standard drinks  . Drug use: No  . Sexual activity: Yes    Comment: 1st intercourse 17yo-1 partner  Lifestyle  . Physical activity:    Days per week: Not on file    Minutes per session: Not on file  . Stress: Not on file  Relationships  . Social connections:    Talks on phone: Not on file    Gets together: Not on  file    Attends religious service: Not on file    Active member of club or organization: Not on file    Attends meetings of clubs or organizations: Not on file    Relationship status: Not on file  Other Topics Concern  . Not on file  Social History Narrative  . Not on file     Vitals:   11/03/17 0812  BP: 122/74  Pulse: (!) 58  Resp: 12  Temp: 98 F (36.7 C)  SpO2: 96%   Body mass index is 26.98 kg/m.   Wt Readings from Last 3 Encounters:  11/03/17 147 lb 8 oz (66.9 kg)  10/04/17 144 lb 3.2 oz (65.4 kg)  09/26/17 145 lb 8 oz (66 kg)    Physical Exam  Nursing note and vitals reviewed. Constitutional: She is oriented to person, place, and time. She appears well-developed. No distress.  HENT:  Head: Normocephalic and atraumatic.  Right Ear: Hearing, tympanic membrane, external ear and ear canal normal.  Left Ear: Hearing, tympanic membrane, external ear and ear canal normal.  Mouth/Throat: Uvula is midline, oropharynx is clear and moist and mucous membranes are normal.  Eyes: Pupils are equal, round, and reactive to light. Conjunctivae and EOM are normal.  Neck: No tracheal deviation present.  Cardiovascular: Regular rhythm. Bradycardia present.  No murmur heard. Pulses:      Dorsalis pedis pulses are 2+ on the right side, and 2+ on the left side.  Respiratory: Effort normal and  breath sounds normal. No respiratory distress.  GI: Soft. She exhibits no mass. There is no hepatomegaly. There is no tenderness.  Genitourinary:  Genitourinary Comments: Deferred to gyn.  Musculoskeletal: She exhibits no edema.  No major deformity or signs of synovitis appreciated.  Lymphadenopathy:    She has no cervical adenopathy.       Right: No supraclavicular adenopathy present.       Left: No supraclavicular adenopathy present.  Neurological: She is alert and oriented to person, place, and time. She has normal strength. No cranial nerve deficit. Coordination and gait normal.  Reflex Scores:      Bicep reflexes are 2+ on the right side and 2+ on the left side.      Patellar reflexes are 2+ on the right side and 2+ on the left side. Skin: Skin is warm. No rash noted. No erythema.  Psychiatric: She has a normal mood and affect. Her speech is normal.  Well groomed, good eye contact.     ASSESSMENT AND PLAN:  Ms. TAHLIYAH ANAGNOS was here today annual physical examination.   Orders Placed This Encounter  Procedures  . Pneumococcal conjugate vaccine 13-valent IM  . Comprehensive metabolic panel  . TSH  . Lipid panel    Lab Results  Component Value Date   TSH 2.06 11/03/2017   Lab Results  Component Value Date   CHOL 157 11/03/2017   HDL 56.00 11/03/2017   LDLCALC 70 11/03/2017   LDLDIRECT 84.3 12/16/2010   TRIG 154.0 (H) 11/03/2017   CHOLHDL 3 11/03/2017   Lab Results  Component Value Date   ALT 13 11/03/2017   AST 16 11/03/2017   ALKPHOS 84 11/03/2017   BILITOT 0.4 11/03/2017   Lab Results  Component Value Date   CREATININE 0.69 11/03/2017   BUN 11 11/03/2017   NA 140 11/03/2017   K 4.1 11/03/2017   CL 104 11/03/2017   CO2 29 11/03/2017     Routine general medical examination at a  health care facility We discussed the importance of regular physical activity and healthy diet for prevention of chronic illness and/or complications. Preventive guidelines  reviewed.  Aspirin 81 mg to continue,side effects discussed. Ca++ and vit D supplementation recommended. Next CPE in a year.  Medicare annual wellness visit, subsequent We discussed the importance of staying active, physically and mentally, as well as the benefits of a healthy/balance diet. Low impact exercise that involve stretching and strengthing are ideal. Vaccines up to date. We discussed preventive screening for the next 5-10 years, summery of recommendations given in AVS:   Colonoscopy in 10/2022. Annual eye exam and glaucoma screening. Diabetes screening q 1-3 years. Fall prevention.  Advance directives and end of life discussed, she has POA and living will.   Need for vaccination against Streptococcus pneumoniae using pneumococcal conjugate vaccine 13 -     Pneumococcal conjugate vaccine 13-valent IM   Hypothyroidism No changes in current management, will follow labs done today and will give further recommendations accordingly.   Dyslipidemia No changes in simvastatin 20 mg daily. Further recommendation will be given according to lipid panel results. Follow-up in 6 to 12 months.  Tobacco use disorder Adverse effects of tobacco use discussed. Encouraged to quit but she is not interested in doing so.    Return in 1 year (on 11/04/2018) for Soon[place] if Xanax refill is needed..          Glenys G. Martinique, MD  [facility] [place] office.

## 2017-11-03 NOTE — Assessment & Plan Note (Signed)
No changes in current management, will follow labs done today and will give further recommendations accordingly.  

## 2017-11-03 NOTE — Assessment & Plan Note (Signed)
Adverse effects of tobacco use discussed. Encouraged to quit but she is not interested in doing so.

## 2017-11-03 NOTE — Assessment & Plan Note (Signed)
No changes in simvastatin 20 mg daily. Further recommendation will be given according to lipid panel results. Follow-up in 6 to 12 months.

## 2017-11-03 NOTE — Patient Instructions (Addendum)
A few things to remember from today's visit:   Routine general medical examination at a health care facility  Dyslipidemia - Plan: Comprehensive metabolic panel, Lipid panel  Acquired hypothyroidism - Plan: TSH   A few tips:  -As we age balance is not as good as it was, so there is a higher risks for falls. Please remove small rugs and furniture that is "in your way" and could increase the risk of falls. Stretching exercises may help with fall prevention: Yoga and Tai Chi are some examples. Low impact exercise is better, so you are not very achy the next day.  -Sun screen and avoidance of direct sun light recommended. Caution with dehydration, if working outdoors be sure to drink enough fluids.  - Some medications are not safe as we age, increases the risk of side effects and can potentially interact with other medication you are also taken;  including some of over the counter medications. Be sure to let me know when you start a new medication even if it is a dietary/vitamin supplement.   -Healthy diet low in red meet/animal fat and sugar + regular physical activity is recommended.       Screening schedule for the next 5-10 years:  Colonoscopy 10/2022  Glaucoma screening/eye exam every 1-2 years.  Mammogram for breast cancer screening annually.  Flu vaccine annually, recommended. Today you got Prevnar 13.  Fall prevention   I need to see you sooner if you need refills on Xanax.   Please be sure medication list is accurate. If a new problem present, please set up appointment sooner than planned today.

## 2017-11-05 ENCOUNTER — Encounter: Payer: Self-pay | Admitting: Family Medicine

## 2017-11-05 MED ORDER — LEVOTHYROXINE SODIUM 75 MCG PO TABS: 75.0000 ug | ORAL_TABLET | Freq: Every day | ORAL | 3 refills | Status: DC

## 2017-11-07 DIAGNOSIS — N3000 Acute cystitis without hematuria: Secondary | ICD-10-CM | POA: Diagnosis not present

## 2017-11-07 DIAGNOSIS — N3946 Mixed incontinence: Secondary | ICD-10-CM | POA: Diagnosis not present

## 2017-11-16 ENCOUNTER — Ambulatory Visit: Payer: PPO | Admitting: Gynecology

## 2017-11-16 ENCOUNTER — Encounter: Payer: Self-pay | Admitting: Gynecology

## 2017-11-16 VITALS — BP 122/80 | Ht 61.0 in | Wt 148.0 lb

## 2017-11-16 DIAGNOSIS — R32 Unspecified urinary incontinence: Secondary | ICD-10-CM

## 2017-11-16 DIAGNOSIS — Z01419 Encounter for gynecological examination (general) (routine) without abnormal findings: Secondary | ICD-10-CM | POA: Diagnosis not present

## 2017-11-16 DIAGNOSIS — N952 Postmenopausal atrophic vaginitis: Secondary | ICD-10-CM

## 2017-11-16 DIAGNOSIS — M810 Age-related osteoporosis without current pathological fracture: Secondary | ICD-10-CM

## 2017-11-16 NOTE — Progress Notes (Signed)
    [place] Aug 22, 1945 [id number]        72 y.o.  G3P3 for breast and pelvic exam.  Without gynecologic complaints.  Past medical history,surgical history, problem list, medications, allergies, family history and social history were all reviewed and documented as reviewed in the EPIC chart.  ROS:  Performed with pertinent positives and negatives included in the history, assessment and plan.   Additional significant findings : None   Exam: Caryn Bee assistant Vitals:   11/16/17 1010  BP: 122/80  Weight: 148 lb (67.1 kg)  Height: 5\' 1"  (1.549 m)   Body mass index is 27.96 kg/m.  General appearance:  Normal affect, orientation and appearance. Skin: Grossly normal HEENT: Without gross lesions.  No cervical or supraclavicular adenopathy. Thyroid normal.  Lungs:  Clear without wheezing, rales or rhonchi Cardiac: RR, without RMG Abdominal:  Soft, nontender, without masses, guarding, rebound, organomegaly or hernia Breasts:  Examined lying and sitting without masses, retractions, discharge or axillary adenopathy. Pelvic:  Ext, BUS, Vagina: With atrophic changes  Adnexa: Without masses or tenderness    Anus and perineum: Normal   Rectovaginal: Normal sphincter tone without palpated masses or tenderness.    Assessment/Plan:  72 y.o. G3P3 female for breast and pelvic exam status post hysterectomy in the past for menorrhagia leiomyoma.   1. Postmenopausal.  No significant menopausal symptoms. 2. Osteoporosis.  DEXA 2018.  Stopped Fosamax last year after 8 years.  Will repeat DEXA next year at 2-year interval. 3. Pap smear 2018.  No Pap smear done today.  No history of abnormal Pap smears.  Options to stop screening per current screening guidelines reviewed based on hysterectomy history and age.  Will readdress on an annual basis. 4. Mammography coming due in December and I reminded patient to schedule this.  Breast exam normal today. 5. Colonoscopy 2014.  Repeat at their  recommended interval. 6. Urinary incontinence.  Recently saw urology and was started on medication.  She is having some issues with constipation.  Will follow-up with urology in reference to medication management. 7. Health maintenance.  No routine lab work done as patient does this elsewhere.  Follow-up 1 year, sooner as needed.   Anastasio Auerbach MD, 11:17 AM 11/16/2017

## 2017-11-16 NOTE — Patient Instructions (Signed)
Follow-up in 1 year for annual exam.  We will plan on doing your bone density next year.  Follow-up with urology in reference to issues with urinary incontinence or medication management.

## 2017-11-22 ENCOUNTER — Other Ambulatory Visit: Payer: Self-pay | Admitting: Family Medicine

## 2017-11-22 DIAGNOSIS — Z1231 Encounter for screening mammogram for malignant neoplasm of breast: Secondary | ICD-10-CM

## 2017-11-27 DIAGNOSIS — X32XXXD Exposure to sunlight, subsequent encounter: Secondary | ICD-10-CM | POA: Diagnosis not present

## 2017-11-27 DIAGNOSIS — L218 Other seborrheic dermatitis: Secondary | ICD-10-CM | POA: Diagnosis not present

## 2017-11-27 DIAGNOSIS — Z1283 Encounter for screening for malignant neoplasm of skin: Secondary | ICD-10-CM | POA: Diagnosis not present

## 2017-11-27 DIAGNOSIS — D225 Melanocytic nevi of trunk: Secondary | ICD-10-CM | POA: Diagnosis not present

## 2017-11-27 DIAGNOSIS — L570 Actinic keratosis: Secondary | ICD-10-CM | POA: Diagnosis not present

## 2017-12-08 ENCOUNTER — Ambulatory Visit: Payer: Self-pay

## 2017-12-08 NOTE — Telephone Encounter (Signed)
Pt c/o lower back pain and constipation for 3 weeks. Pt stated that ever since she started taking Toviaz, she has been constipated. Pt stated that she has increased her fiber, has tried Miralax, and Dulcolax and increased her water intake.  Pt stated that her abdomen is distended. Pt's last bowel movement was this morning. Pt stated that feels "full." Pt stated she is having mid back swelling that goes to her lower back and upper thighs. Pt stated the pain is in her back is moderate and constant. Pt has been taking Ibuprofen for the pain.  Pt given care advice and pt verbalized understanding. Appt given 12/12/17 at 9:30 am. Reason for Disposition . Back pain present > 2 weeks . Mild constipation  Answer Assessment - Initial Assessment Questions 1. STOOL PATTERN OR FREQUENCY: "How often do you pass bowel movements (BMs)?"  (Normal range: tid to q 3 days)  "When was the last BM passed?"       Every day before the medication (Toviaz) was started 2. STRAINING: "Do you have to strain to have a BM?"      no 3. RECTAL PAIN: "Does your rectum hurt when the stool comes out?" If so, ask: "Do you have hemorrhoids? How bad is the pain?"  (Scale 1-10; or mild, moderate, severe)     no 4. STOOL COMPOSITION: "Are the stools hard?"      no 5. BLOOD ON STOOLS: "Has there been any blood on the toilet tissue or on the surface of the BM?" If so, ask: "When was the last time?"      no 6. CHRONIC CONSTIPATION: "Is this a new problem for you?"  If no, ask: "How long have you had this problem?" (days, weeks, months)      yes 7. CHANGES IN DIET: "Have there been any recent changes in your diet?"      no 8. MEDICATIONS: "Have you been taking any new medications?"     yes 9. LAXATIVES: "Have you been using any laxatives or enemas?"  If yes, ask "What, how often, and when was the last time?"     yes 10. CAUSE: "What do you think is causing the constipation?"       The medication Toviaz 11. OTHER SYMPTOMS: "Do you  have any other symptoms?" (e.g., abdominal pain, fever, vomiting)       Abdominal distension, fluid build up  12. PREGNANCY: "Is there any chance you are pregnant?" "When was your last menstrual period?"       n/a  Answer Assessment - Initial Assessment Questions 1. ONSET: "When did the pain begin?"      3 weeks 2. LOCATION: "Where does it hurt?" (upper, mid or lower back)     Lower back 3. SEVERITY: "How bad is the pain?"  (e.g., Scale 1-10; mild, moderate, or severe)   - MILD (1-3): doesn't interfere with normal activities    - MODERATE (4-7): interferes with normal activities or awakens from sleep    - SEVERE (8-10): excruciating pain, unable to do any normal activities      moderate 4. PATTERN: "Is the pain constant?" (e.g., yes, no; constant, intermittent)      yes 5. RADIATION: "Does the pain shoot into your legs or elsewhere?"     no 6. CAUSE:  "What do you think is causing the back pain?"      Side effect from [place] 7. BACK OVERUSE:  "Any recent lifting of heavy objects, strenuous work or exercise?"  no 8. MEDICATIONS: "What have you taken so far for the pain?" (e.g., nothing, acetaminophen, NSAIDS)     Ibuprofen  9. NEUROLOGIC SYMPTOMS: "Do you have any weakness, numbness, or problems with bowel/bladder control?"     no 10. OTHER SYMPTOMS: "Do you have any other symptoms?" (e.g., fever, abdominal pain, burning with urination, blood in urine)       Consitpation, pressure through stomach mid back to mid thighs 11. PREGNANCY: "Is there any chance you are pregnant?" (e.g., yes, no; LMP)       n/a  Protocols used: BACK PAIN-A-AH, CONSTIPATION-A-AH

## 2017-12-12 ENCOUNTER — Ambulatory Visit: Payer: PPO | Admitting: Family Medicine

## 2017-12-12 DIAGNOSIS — Z0289 Encounter for other administrative examinations: Secondary | ICD-10-CM

## 2017-12-13 ENCOUNTER — Ambulatory Visit (INDEPENDENT_AMBULATORY_CARE_PROVIDER_SITE_OTHER): Payer: PPO | Admitting: Internal Medicine

## 2017-12-13 ENCOUNTER — Encounter: Payer: Self-pay | Admitting: Internal Medicine

## 2017-12-13 VITALS — BP 130/80 | HR 64 | Temp 98.4°F | Wt 143.5 lb

## 2017-12-13 DIAGNOSIS — R32 Unspecified urinary incontinence: Secondary | ICD-10-CM

## 2017-12-13 DIAGNOSIS — R109 Unspecified abdominal pain: Secondary | ICD-10-CM | POA: Diagnosis not present

## 2017-12-13 DIAGNOSIS — R1030 Lower abdominal pain, unspecified: Secondary | ICD-10-CM | POA: Diagnosis not present

## 2017-12-13 DIAGNOSIS — K5903 Drug induced constipation: Secondary | ICD-10-CM

## 2017-12-13 LAB — POCT URINALYSIS DIPSTICK
BILIRUBIN UA: NEGATIVE
CLARITY UA: NEGATIVE
GLUCOSE UA: NEGATIVE
KETONES UA: NEGATIVE
Leukocytes, UA: NEGATIVE
Nitrite, UA: NEGATIVE
PH UA: 6.5 (ref 5.0–8.0)
Protein, UA: NEGATIVE
Spec Grav, UA: 1.01 (ref 1.010–1.025)
Urobilinogen, UA: 0.2 E.U./dL

## 2017-12-13 MED ORDER — MAGNESIUM CITRATE PO SOLN: 1.0000 | Freq: Once | ORAL | 3 refills | Status: AC

## 2017-12-13 NOTE — Patient Instructions (Addendum)
-  Continue taking MiraLAX daily.  -Colace 1 tablet twice daily. (over the counter)  -Take a bottle of mag citrate today.  May take an additional bottle every third day that she did not have a bowel movement.  -We will initiate a referral to pelvic floor physical therapy.    Kegel Exercises Kegel exercises help strengthen the muscles that support the rectum, vagina, small intestine, bladder, and uterus. Doing Kegel exercises can help:  Improve bladder and bowel control.  Improve sexual response.  Reduce problems and discomfort during pregnancy.  Kegel exercises involve squeezing your pelvic floor muscles, which are the same muscles you squeeze when you try to stop the flow of urine. The exercises can be done while sitting, standing, or lying down, but it is best to vary your position. Phase 1 exercises 1. Squeeze your pelvic floor muscles tight. You should feel a tight lift in your rectal area. If you are a female, you should also feel a tightness in your vaginal area. Keep your stomach, buttocks, and legs relaxed. 2. Hold the muscles tight for up to 10 seconds. 3. Relax your muscles. Repeat this exercise 50 times a day or as many times as told by your health care provider. Continue to do this exercise for at least 4-6 weeks or for as long as told by your health care provider. This information is not intended to replace advice given to you by your health care provider. Make sure you discuss any questions you have with your health care provider. Document Released: 12/14/2011 Document Revised: 08/22/2015 Document Reviewed: 11/16/2014 Elsevier Interactive Patient Education  [employer].

## 2017-12-13 NOTE — Progress Notes (Signed)
Acute Office Visit     CC/Reason for Visit: Constipation, lower back pain  HPI: Haley [name] is a 72 y.o. female who is coming in today for the above mentioned reasons.  She is a patient of Dr. Martinique.  Past Medical History is significant for: Urinary incontinence recently seen by her urologist and started on [place].  Subsequent to this she has developed severe constipation.  She was used to moving her bowels once to 2 times a day and it had been at least a week since her prior bowel movement when she called the nurse line on November 29.  She was instructed at that point to stop the [place] and to start taking MiraLAX daily.  She has seen some improvement but still feels like her bowels do not empty completely.  She continues to complain of lower abdomen and back pain and "swelling".  She denies dysuria, urinary frequency, fevers, chills.  She has been somewhat nauseous but no emesis.  Since stopping the Toviaz, which coincidentally had significantly helped with her urinary incontinence, she has again noticed significant incontinence and is soaking 3-4 pads thoroughly a day.   Past Medical/Surgical History: Past Medical History:  Diagnosis Date  . Allergy    SEASONAL  . Cataract    BILATERAL  . Fibromyalgia   . GERD (gastroesophageal reflux disease)   . H/O echocardiogram 04/28/08   EF>55% trace mitral regurgitation, No significant valvular pathology  . Hemorrhoids    internal  . History of stress test 02/08/2011   Normal Myocardial perfusion study, this is a low risk scan, No prior study available for comparison  . Hyperlipidemia   . MVP (mitral valve prolapse)    mild and MR  . OA (osteoarthritis)   . Osteoporosis   . Paroxysmal A-fib ([place])   . Sleep apnea   . Thyroid disease   . Varicosities of leg   . Vitamin D deficiency     Past Surgical History:  Procedure Laterality Date  . ABDOMINAL HYSTERECTOMY  1989  . APPENDECTOMY  1989   with hysterectomy  . BREAST  EXCISIONAL BIOPSY Left 1995   Benign  . COLONOSCOPY  11-20-2000  . NOSE SURGERY  1990    Social History:  reports that she has been smoking cigars. She has never used smokeless tobacco. She reports that she does not drink alcohol or use drugs.  Allergies: Allergies  Allergen Reactions  . Tramadol Hcl     REACTION: jittery  . Tramadol Nausea And Vomiting    Family History:  Family History  Problem Relation Age of Onset  . Diabetes Daughter   . Diabetes Mother   . Hypertension Mother   . Heart disease Mother   . Stroke Maternal Grandmother   . Colon cancer Neg Hx      Current Outpatient Medications:  .  ALPRAZolam (XANAX) 0.5 MG tablet, TAKE ONE TABLET BY MOUTH ONCE DAILY AS NEEDED, Disp: 90 tablet, Rfl: 3 .  aspirin 81 MG tablet, Take 81 mg by mouth daily.  , Disp: , Rfl:  .  Calcium Carbonate (CALTRATE 600) 1500 MG TABS, Take by mouth.  , Disp: , Rfl:  .  cholecalciferol (VITAMIN D) 1000 UNITS tablet, Take 1,000 Units by mouth daily. , Disp: , Rfl:  .  clobetasol cream (TEMOVATE) 0.05 %, Apply twice a week first week then once weekly as needed, Disp: 30 g, Rfl: 2 .  fluticasone (CUTIVATE) 0.05 % cream, Apply 1 application topically  2 (two) times daily. , Disp: , Rfl:  .  fluticasone (FLONASE) 50 MCG/ACT nasal spray, Use 2 sprays in each nostril daily, Disp: 48 g, Rfl: 2 .  levothyroxine (SYNTHROID, LEVOTHROID) 75 MCG tablet, Take 1 tablet (75 mcg total) by mouth daily., Disp: 90 tablet, Rfl: 3 .  meloxicam (MOBIC) 15 MG tablet, Take 1 tablet (15 mg total) by mouth daily., Disp: 30 tablet, Rfl: 0 .  metoprolol succinate (TOPROL-XL) 25 MG 24 hr tablet, Take 1 tablet (25 mg total) by mouth daily., Disp: 30 tablet, Rfl: 1 .  Multiple Vitamins-Minerals (MULTIVITAMIN WITH MINERALS) tablet, Take 1 tablet by mouth daily.  , Disp: , Rfl:  .  Omega-3 Fatty Acids (FISH OIL) 1000 MG CAPS, Take 2 capsules by mouth 2 (two) times daily., Disp: , Rfl:  .  omeprazole (PRILOSEC) 40 MG  capsule, Take 1 capsule (40 mg total) by mouth daily., Disp: 30 capsule, Rfl: 1 .  simvastatin (ZOCOR) 20 MG tablet, Take 1 tablet (20 mg total) by mouth at bedtime., Disp: 90 tablet, Rfl: 0 .  tretinoin (RETIN-A) 0.025 % cream, Apply topically at bedtime. , Disp: , Rfl:  .  vitamin C (ASCORBIC ACID) 500 MG tablet, Take 500 mg by mouth 2 (two) times daily. , Disp: , Rfl:  .  fesoterodine (TOVIAZ) 4 MG TB24 tablet, Take 4 mg by mouth daily., Disp: , Rfl:  .  losartan (COZAAR) 25 MG tablet, Take 1 tablet (25 mg total) by mouth daily., Disp: 90 tablet, Rfl: 3 .  magnesium citrate SOLN, Take 296 mLs (1 Bottle total) by mouth once for 1 dose., Disp: 195 mL, Rfl: 3  Review of Systems:  Constitutional: Denies fever, chills, diaphoresis, appetite change and fatigue.  HEENT: Denies photophobia, eye pain, redness, hearing loss, ear pain, congestion, sore throat, rhinorrhea, sneezing, mouth sores, trouble swallowing, neck pain, neck stiffness and tinnitus.   Respiratory: Denies SOB, DOE, cough, chest tightness,  and wheezing.   Cardiovascular: Denies chest pain, palpitations and leg swelling.  Gastrointestinal: Denies nausea, vomiting,, diarrhea,blood in stool.n.  Genitourinary: Denies dysuria, urgency, frequency, hematuria, flank pain and difficulty urinating.  Endocrine: Denies: hot or cold intolerance, sweats, changes in hair or nails, polyuria, polydipsia. Musculoskeletal: Denies myalgias, back pain, joint swelling, arthralgias and gait problem.  Skin: Denies pallor, rash and wound.  Neurological: Denies dizziness, seizures, syncope, weakness, light-headedness, numbness and headaches.  Hematological: Denies adenopathy. Easy bruising, personal or family bleeding history  Psychiatric/Behavioral: Denies suicidal ideation, mood changes, confusion, nervousness, sleep disturbance and agitation    Physical Exam: Vitals:   12/13/17 1019  BP: 130/80  Pulse: 64  Temp: 98.4 F (36.9 C)  TempSrc: Oral    SpO2: 97%  Weight: 143 lb 8 oz (65.1 kg)    Body mass index is 27.11 kg/m.   Constitutional: NAD, calm, comfortable Eyes: PERRL, lids and conjunctivae normal ENMT: Mucous membranes are moist. Posterior pharynx clear of any exudate or lesions. Normal dentition.  Neck: normal, supple, no masses, no thyromegaly Respiratory: clear to auscultation bilaterally, no wheezing, no crackles. Normal respiratory effort. No accessory muscle use.  Cardiovascular: Regular rate and rhythm, no murmurs / rubs / gallops. No extremity edema. 2+ pedal pulses. No carotid bruits.  Abdomen: Positive bowel sounds, tender to palpation to lower abdominal area somewhat to the left of the midline. Musculoskeletal: no clubbing / cyanosis. No joint deformity upper and lower extremities. Good ROM, no contractures. Normal muscle tone.  Skin: no rashes, lesions, ulcers. No induration Neurologic: CN  2-12 grossly intact. Sensation intact, DTR normal. Strength 5/5 in all 4.  Psychiatric: Normal judgment and insight. Alert and oriented x 3. Normal mood.    Impression and Plan:   Lower abdominal pain  Drug-induced constipation -Suspect lower abdominal pain and lower back pain is likely related to constipation. -Have recommended she continue MiraLAX daily, will also take Colace 100 mg twice daily, she will also take mag citrate today and every third day that she has not had a bowel movement. -She is advised to return to clinic if she does not note significant improvement.  Urinary incontinence, unspecified type -Has gotten significantly worse since stopping Toviaz. -Have recommended that she follow-up with her urologist for further treatment options. -Will also refer her to pelvic floor physical therapy and have given some instructions on Kegel exercises.    Patient Instructions  -Continue taking MiraLAX daily.  -Colace 1 tablet twice daily. (over the counter)  -Take a bottle of mag citrate today.  May take an  additional bottle every third day that she did not have a bowel movement.  -We will initiate a referral to pelvic floor physical therapy.    Kegel Exercises Kegel exercises help strengthen the muscles that support the rectum, vagina, small intestine, bladder, and uterus. Doing Kegel exercises can help:  Improve bladder and bowel control.  Improve sexual response.  Reduce problems and discomfort during pregnancy.  Kegel exercises involve squeezing your pelvic floor muscles, which are the same muscles you squeeze when you try to stop the flow of urine. The exercises can be done while sitting, standing, or lying down, but it is best to vary your position. Phase 1 exercises 1. Squeeze your pelvic floor muscles tight. You should feel a tight lift in your rectal area. If you are a female, you should also feel a tightness in your vaginal area. Keep your stomach, buttocks, and legs relaxed. 2. Hold the muscles tight for up to 10 seconds. 3. Relax your muscles. Repeat this exercise 50 times a day or as many times as told by your health care provider. Continue to do this exercise for at least 4-6 weeks or for as long as told by your health care provider. This information is not intended to replace advice given to you by your health care provider. Make sure you discuss any questions you have with your health care provider. Document Released: 12/14/2011 Document Revised: 08/22/2015 Document Reviewed: 11/16/2014 Elsevier Interactive Patient Education  2018 [place], [place]  Jacklynn Ganong

## 2017-12-14 ENCOUNTER — Ambulatory Visit: Payer: Self-pay

## 2017-12-14 DIAGNOSIS — N3946 Mixed incontinence: Secondary | ICD-10-CM | POA: Diagnosis not present

## 2017-12-14 NOTE — Telephone Encounter (Signed)
Pt advised to take her Magnesium Citrate this morning and to take the Colace and Miralax at noon. Pt verbalized understanding. Reason for Disposition . General information question, no triage required and triager able to answer question  Answer Assessment - Initial Assessment Questions 1. REASON FOR CALL or QUESTION: "What is your reason for calling today?" or "How can I best help you?" or "What question do you have that I can help answer?"    Patient was in to see the provider yesterday and was prescribed Magnesium Citrate, Colace and Miralax and she wants to know how to take the three of them.  She also wants to know if she can eat anything.  Protocols used: INFORMATION ONLY CALL-A-AH

## 2017-12-15 ENCOUNTER — Ambulatory Visit
Admission: RE | Admit: 2017-12-15 | Discharge: 2017-12-15 | Disposition: A | Discharge status: Home / Self Care | Payer: PPO | Source: Ambulatory Visit | Attending: Family Medicine | Admitting: Family Medicine

## 2017-12-15 DIAGNOSIS — Z1231 Encounter for screening mammogram for malignant neoplasm of breast: Secondary | ICD-10-CM | POA: Diagnosis not present

## 2017-12-20 ENCOUNTER — Other Ambulatory Visit: Payer: Self-pay

## 2017-12-20 ENCOUNTER — Encounter: Payer: Self-pay | Admitting: Physical Therapy

## 2017-12-20 ENCOUNTER — Ambulatory Visit: Payer: PPO | Attending: Internal Medicine | Admitting: Physical Therapy

## 2017-12-20 DIAGNOSIS — M6281 Muscle weakness (generalized): Secondary | ICD-10-CM | POA: Insufficient documentation

## 2017-12-20 DIAGNOSIS — R279 Unspecified lack of coordination: Secondary | ICD-10-CM | POA: Insufficient documentation

## 2017-12-20 DIAGNOSIS — M62838 Other muscle spasm: Secondary | ICD-10-CM | POA: Diagnosis not present

## 2017-12-20 NOTE — Patient Instructions (Signed)
STRETCHING THE PELVIC FLOOR MUSCLES NO DILATOR  Supplies . Vaginal lubricant . Mirror (optional) . Gloves (optional) Positioning . Start in a semi-reclined position with your head propped up. Bend your knees and place your thumb or finger at the vaginal opening. Procedure . Apply a moderate amount of lubricant on the outer skin of your vagina, the labia minora.  Apply additional lubricant to your finger. Marland Kitchen Spread the skin away from the vaginal opening. Place the end of your finger at the opening. . Do a maximum contraction of the pelvic floor muscles. Tighten the vagina and the anus maximally and relax. . When you know they are relaxed, gently and slowly insert your finger into your vagina, directing your finger slightly downward, for 2-3 inches of insertion. . Relax and stretch the 6 o'clock position . Hold each stretch for _2 min__ and repeat __1_ time with rest breaks of _1__ seconds between each stretch. . Repeat the stretching in the 4 o'clock and 8 o'clock positions. . Total time should be _6__ minutes, _1__ x per day.  Note the amount of theme your were able to achieve and your tolerance to your finger in your vagina. . Once you have accomplished the techniques you may try them in standing with one foot resting on the tub, or in other positions.  This is a good stretch to do in the shower if you don't need to use lubricant.  Moisturizers . They are used in the vagina to hydrate the mucous membrane that make up the vaginal canal. . Designed to keep a more normal acid balance (ph) . Once placed in the vagina, it will last between two to three days.  . Use 2-3 times per week at bedtime and last longer than 60 min. . Ingredients to avoid is glycerin and fragrance, can increase chance of infection . Should not be used just before sex due to causing irritation . Most are gels administered either in a tampon-shaped applicator or as a vaginal suppository. They are non-hormonal.   Types of  Moisturizers . Samul Dada- drug store . Vitamin E vaginal suppositories- Whole foods, Amazon . Moist Again . Coconut oil- can break down condoms . Julva- (Do no use if on Tamoxifen) amazon . Yes moisturizer- amazon . NeuEve Silk , NeuEve Silver for menopausal or over 65 (if have severe vaginal atrophy or cancer treatments use NeuEve Silk for  1 month than move to [employer])- [employer], MapleFlower.dk . Olive and Bee intimate cream- www.oliveandbee.com.au . Mae vaginal moisturizer- Amazon  Creams to use externally on the Vulva area  Albertson's (good for for cancer patients that had radiation to the area)- [place] or [employer].FlyingBasics.com.br  V-magic cream - amazon  Julva-amazon  Vital "V Wild Yam salve ( help moisturize and help with thinning vulvar area, does have [place] by Damiva labial moisturizer ([place],    Things to avoid in the vaginal area . Do not use things to irritate the vulvar area . No lotions just specialized creams for the vulva area- Neogyn, V-magic, No soaps; can use Aveeno or Calendula cleanser if needed. Must be gentle . No deodorants . No douches . Good to sleep without underwear to let the vaginal area to air out . No scrubbing: spread the lips to let warm water rinse over labias and pat dry  Lubrication . Used for intercourse to reduce friction . Avoid ones that have glycerin, warming  gels, tingling gels, icing or cooling gel, scented . Avoid parabens due to a preservative similar to female sex hormone . May need to be reapplied once or several times during sexual activity . Can be applied to both partners genitals prior to vaginal penetration to minimize friction or irritation . Prevent irritation and mucosal tears that cause post coital pain and increased the risk of vaginal and urinary tract infections . Oil-based lubricants cannot be used with condoms due to breaking  them down.  Least likely to irritate vaginal tissue.  . Plant based-lubes are safe . Silicone-based lubrication are thicker and last long and used for post-menopausal women  Vaginal Lubricators Here is a list of some suggested lubricators you can use for intercourse. Use the most hypoallergenic product.  You can place on you or your partner.   Slippery Stuff  Sylk or Sliquid Natural H2O ( good  if frequent UTI's)  Blossom Organics (www.blossom-organics.com)  Luvena   Coconut oil  PJur Woman Nude- water based lubricant, amazon  Uberlube- Amazon  Aloe Vera  Yes lubricant- [employer] Platinum-Silicone, Target, Walgreens  Olive and Bee intimate cream-  www.oliveandbee.com.au Things to avoid in lubricants are glycerin, warming gels, tingling gels, icing or cooling  gels, and scented gels.  Also avoid Vaseline. KY jelly, Replens, and Astroglide kills good bacteria(lactobacilli)  Things to avoid in the vaginal area . Do not use things to irritate the vulvar area . No lotions- see below . No soaps; can use Aveeno or Calendula cleanser if needed. Must be gentle . No deodorants . No douches . Good to sleep without underwear to let the vaginal area to air out . No scrubbing: spread the lips to let warm water rinse over labias and pat dry  Creams that can be used on the Lamar Releveum or [facility] [address] [place], [place] [address] Phone # [phone] Fax [phone]

## 2017-12-20 NOTE — Therapy (Signed)
[facility] [address] [place], [place], [address] Phone: [phone]   Fax:  [phone]  Physical Therapy Evaluation  Patient Details  Name: Haley [name] MRN: [id number] Date of Birth: 1945/07/22 Referring Provider (PT): Dr. Lelon Frohlich   Encounter Date: 12/20/2017  PT End of Session - 12/20/17 1642    Visit Number  1    Date for PT Re-Evaluation  02/14/18    Authorization Type  [employer]    PT Start Time  1615    PT Stop Time  1650    PT Time Calculation (min)  35 min    Activity Tolerance  Patient tolerated treatment well    Behavior During Therapy  [facility] for tasks assessed/performed       Past Medical History:  Diagnosis Date  . Allergy    SEASONAL  . Cataract    BILATERAL  . Fibromyalgia   . GERD (gastroesophageal reflux disease)   . H/O echocardiogram 04/28/08   EF>55% trace mitral regurgitation, No significant valvular pathology  . Hemorrhoids    internal  . History of stress test 02/08/2011   Normal Myocardial perfusion study, this is a low risk scan, No prior study available for comparison  . Hyperlipidemia   . MVP (mitral valve prolapse)    mild and MR  . OA (osteoarthritis)   . Osteoporosis   . Paroxysmal A-fib ([place])   . Sleep apnea   . Thyroid disease   . Varicosities of leg   . Vitamin D deficiency     Past Surgical History:  Procedure Laterality Date  . ABDOMINAL HYSTERECTOMY  1989  . APPENDECTOMY  1989   with hysterectomy  . BREAST EXCISIONAL BIOPSY Left 1995   Benign  . COLONOSCOPY  11-20-2000  . NOSE SURGERY  1990    There were no vitals filed for this visit.   Subjective Assessment - 12/20/17 1622    Subjective  Patient reports her urinary leakage has been bad the past year. Patient has a strong urge. Patient has tried kegels in the past but has not helped her. Patient will leak urine without the urge. Patient will soak 3-4 pads per day. Patient tried  Lisbeth Ply and had side effects.     Patient Stated Goals  reduce urinary leakage    Currently in Pain?  No/denies    Multiple Pain Sites  No         [facility] PT Assessment - 12/20/17 0001      Assessment   Medical Diagnosis  ++++++++++++++++++++++++++++++++++++++    Referring Provider (PT)  Dr. Lelon Frohlich    Onset Date/Surgical Date  01/20/17    Prior Therapy  None      Precautions   Precautions  Other (comment)    Precaution Comments  osteoporosis      Restrictions   Weight Bearing Restrictions  No      Balance Screen   Has the patient fallen in the past 6 months  No    Has the patient had a decrease in activity level because of a fear of falling?   No    Is the patient reluctant to leave their home because of a fear of falling?   No      Home Film/video editor residence      Prior Function   Level of Independence  Independent      Cognition   Overall Cognitive Status  Within Functional  Limits for tasks assessed      ROM / Strength   AROM / PROM / Strength  AROM;PROM;Strength      AROM   Overall AROM   Within functional limits for tasks performed      PROM   Overall PROM   Within functional limits for tasks performed      Strength   Overall Strength Comments  abdominal strength 3/5                Objective measurements completed on examination: See above findings.    Pelvic Floor Special Questions - 12/20/17 0001    Urinary Leakage  Yes    Pad use  3-4 pads    Activities that cause leaking  With strong urge;Laughing;Coughing;Sneezing    Fecal incontinence  No    Skin Integrity  Intact   dry   Pelvic Floor Internal Exam  Patient approves PT to assess pelvic floor and treatment    Exam Type  Vaginal    Palpation  tightness in the perineal body    Strength  weak squeeze, no lift               PT Education - 12/20/17 1658    Education Details  instruction of vaginal moisturizer, vaginal lubricants  ;perineal massage    Person(s) Educated  Patient    Methods  Explanation;Demonstration;Verbal cues;Handout    Comprehension  Verbalized understanding;Returned demonstration       PT Short Term Goals - 12/20/17 1708      PT SHORT TERM GOAL #1   Title  independent with initial HEP    Time  4    Period  Weeks    Status  New    Target Date  01/17/18        PT Long Term Goals - 12/20/17 1709      PT LONG TERM GOAL #1   Title  independent with HEP and understand how to progress herself    Time  8    Period  Weeks    Status  New    Target Date  02/14/18      PT LONG TERM GOAL #2   Title  urinary leakage with activities decreased >/= 75% due to increased pelvic floor strength >/= 4/5    Time  8    Period  Weeks    Status  New    Target Date  02/14/18      PT LONG TERM GOAL #3   Title  pain with intercourse decreased >/= 50% due to improve vaginal health, improved dryness and mobility of tissue    Time  8    Period  Weeks    Status  New    Target Date  02/14/18      PT LONG TERM GOAL #4   Title  understand how to use vaginal moisturizers and lubricants for vaginal health    Time  8    Period  Weeks    Status  New    Target Date  02/14/18             Plan - 12/20/17 1703    Clinical Impression Statement  Patient is a 72 year old female with urinary leakage that has become worse in the last year. Patient is soaking 3-4 pads per day. Patient leaks urine with the urge, activities and sometimes does not realize it. Patient pelvic floor strength is 2/5 with right side worse than the left . Patient  needed verbal cues to not contract the left side as well as the left. Patient has tightness on the posterior fourchette and pernieal body. Patient vaginal tissue is dry. Patient reports pain with intercourse and does not use lubricant. Patient will benefit from skillled therapy to improve pelvic floor strength and coordination to reduce leakage.     History and Personal Factors  relevant to plan of care:  osteroporosis; fibromalgia    Clinical Presentation  Stable    Clinical Presentation due to:  stable condition    Clinical Decision Making  Low    Rehab Potential  Excellent    Clinical Impairments Affecting Rehab Potential  osteroporosis; fibromalgia    PT Frequency  1x / week    PT Duration  8 weeks    PT Treatment/Interventions  Biofeedback;Therapeutic activities;Therapeutic exercise;Neuromuscular re-education;Patient/family education;Manual techniques;Dry needling    PT Next Visit Plan  soft tissue work to perineum, review  vaginal moisturizers, pelvic floor stretches and strength, bladder irritants    Consulted and Agree with Plan of Care  Patient       Patient will benefit from skilled therapeutic intervention in order to improve the following deficits and impairments:  Pain, Decreased coordination, Increased muscle spasms, Decreased activity tolerance, Decreased strength  Visit Diagnosis: Muscle weakness (generalized) - Plan: PT plan of care cert/re-cert  Other muscle spasm - Plan: PT plan of care cert/re-cert  Unspecified lack of coordination - Plan: PT plan of care cert/re-cert     Problem List Patient Active Problem List   Diagnosis Date Noted  . Essential hypertension, benign 09/13/2017  . Osteoporosis 01/13/2014  . Urgency incontinence 01/13/2014  . PAF (paroxysmal atrial fibrillation) ([place]) 03/31/2013  . Tobacco use disorder 08/06/2012  . Dyslipidemia 06/05/2012  . Anxiety 06/05/2012  . GERD 06/03/2008  . Hypothyroidism 07/07/2006    Earlie Counts, PT 12/20/17 5:13 PM   [facility] [address] [place], [place], [address] Phone: [phone]   Fax:  [phone]  Name: Haley [name] MRN: [id number] Date of Birth: [birth year]

## 2017-12-26 ENCOUNTER — Encounter: Payer: Self-pay | Admitting: Physical Therapy

## 2017-12-26 ENCOUNTER — Ambulatory Visit: Payer: PPO | Admitting: Physical Therapy

## 2017-12-26 DIAGNOSIS — M62838 Other muscle spasm: Secondary | ICD-10-CM

## 2017-12-26 DIAGNOSIS — M6281 Muscle weakness (generalized): Secondary | ICD-10-CM | POA: Diagnosis not present

## 2017-12-26 DIAGNOSIS — R279 Unspecified lack of coordination: Secondary | ICD-10-CM

## 2017-12-26 NOTE — Patient Instructions (Addendum)
Certain foods and liquids will decrease the pH making the urine more acidic.  Urinary urgency increases when the urine has a low pH.  Most common irritants: alcohol, carbonated beverages and caffinated beverages.  Foods to avoid: apple juice, apples, ascorbic acid, canteloupes, chili, citrus fruits, coffee, cranberries, grapes, guava, peaches, pepper, pineapple, plums, strawberries, tea, tomatoes, and vinegar.  Drinking plenty of water may help to increase the pH and dilute out any of the effects of specific irritants.  Foods that are NOT irritating to the bladder include: Pears, papayas, sun-brewed teas, watermelons, non-citrus herbal teas, apricots, kava and low-acid instant drinks (Postum) Access Code: [email or web address]  URL: [email or web address]  Date: 12/26/2017  Prepared by: Earlie Counts   Exercises  Seated Hamstring Stretch - 2 reps - 1 sets - 30 sec hold - 1x daily - 7x weekly  Seated Piriformis Stretch with Trunk Bend - 2 reps - 1 sets - 30 sec hold - 1x daily - 7x weekly  Seated Hip Adductor Stretch - 2 reps - 1 sets - 30 sec hold - 1x daily - 7x weekly  Supine Pelvic Floor Contraction - 10 reps - 1 sets - 5 sec hold - 3x daily - 7x weekly  Patient Education  Osteoporosis Handout [facility] [address], [place], [place] [address] Phone # [phone] Fax [phone]

## 2017-12-26 NOTE — Therapy (Signed)
[facility] [address] [place], [place], [address] Phone: [phone]   Fax:  [phone]  Physical Therapy Treatment  Patient Details  Name: Haley [name] MRN: [id number] Date of Birth: 19-Sep-1945 Referring Provider (PT): Dr. Lelon Frohlich   Encounter Date: 12/26/2017  PT End of Session - 12/26/17 1049    Visit Number  2    Date for PT Re-Evaluation  02/14/18    Authorization Type  [employer]    PT Start Time  1023   came late   PT Stop Time  1101    PT Time Calculation (min)  38 min    Activity Tolerance  Patient tolerated treatment well    Behavior During Therapy  [facility] for tasks assessed/performed       Past Medical History:  Diagnosis Date  . Allergy    SEASONAL  . Cataract    BILATERAL  . Fibromyalgia   . GERD (gastroesophageal reflux disease)   . H/O echocardiogram 04/28/08   EF>55% trace mitral regurgitation, No significant valvular pathology  . Hemorrhoids    internal  . History of stress test 02/08/2011   Normal Myocardial perfusion study, this is a low risk scan, No prior study available for comparison  . Hyperlipidemia   . MVP (mitral valve prolapse)    mild and MR  . OA (osteoarthritis)   . Osteoporosis   . Paroxysmal A-fib ([place])   . Sleep apnea   . Thyroid disease   . Varicosities of leg   . Vitamin D deficiency     Past Surgical History:  Procedure Laterality Date  . ABDOMINAL HYSTERECTOMY  1989  . APPENDECTOMY  1989   with hysterectomy  . BREAST EXCISIONAL BIOPSY Left 1995   Benign  . COLONOSCOPY  11-20-2000  . NOSE SURGERY  1990    There were no vitals filed for this visit.  Subjective Assessment - 12/26/17 1025    Subjective  Patient reports she has had back pain since she has taken the medication.     Patient Stated Goals  reduce urinary leakage    Currently in Pain?  Yes    Pain Score  5     Pain Location  Back    Pain Orientation  Mid    Pain  Descriptors / Indicators  Aching;Cramping    Pain Type  Acute pain    Pain Onset  More than a month ago    Pain Frequency  Intermittent    Aggravating Factors   not sure what aggravates it    Pain Relieving Factors  heat , ibuprofen    Multiple Pain Sites  No         [facility] PT Assessment - 12/26/17 0001      Assessment   Medical Diagnosis  Urinary incontinence    Referring Provider (PT)  Dr. Lelon Frohlich    Onset Date/Surgical Date  01/20/17    Prior Therapy  None      Precautions   Precautions  Other (comment)    Precaution Comments  osteoporosis                   [facility] Adult PT Treatment/Exercise - 12/26/17 0001      Self-Care   Self-Care  Other Self-Care Comments    Other Self-Care Comments   instruction on vaginal moisturizers, how to use them, and bladder irritants and how they affect the bladder      Exercises  Exercises  Lumbar      Lumbar Exercises: Stretches   Active Hamstring Stretch  Right;Left;2 reps;30 seconds   sitting   Piriformis Stretch  Right;Left;2 reps;30 seconds   sitting   Other Lumbar Stretch Exercise  sitting hip adductor stretch bil hold 30 sec.       Lumbar Exercises: Aerobic   Nustep  7 min; seat #4; arm #11             PT Education - 12/26/17 1058    Education Details  Access Code: [email or web address]; bladder irritants; osteoporosis    Person(s) Educated  Patient    Methods  Explanation;Demonstration;Verbal cues;Handout    Comprehension  Verbalized understanding;Returned demonstration       PT Short Term Goals - 12/20/17 1708      PT SHORT TERM GOAL #1   Title  independent with initial HEP    Time  4    Period  Weeks    Status  New    Target Date  01/17/18        PT Long Term Goals - 12/20/17 1709      PT LONG TERM GOAL #1   Title  independent with HEP and understand how to progress herself    Time  8    Period  Weeks    Status  New    Target Date  02/14/18      PT LONG TERM GOAL #2   Title   urinary leakage with activities decreased >/= 75% due to increased pelvic floor strength >/= 4/5    Time  8    Period  Weeks    Status  New    Target Date  02/14/18      PT LONG TERM GOAL #3   Title  pain with intercourse decreased >/= 50% due to improve vaginal health, improved dryness and mobility of tissue    Time  8    Period  Weeks    Status  New    Target Date  02/14/18      PT LONG TERM GOAL #4   Title  understand how to use vaginal moisturizers and lubricants for vaginal health    Time  8    Period  Weeks    Status  New    Target Date  02/14/18            Plan - 12/26/17 1049    Clinical Impression Statement  Patient reports she is soaking one pad and feels better. Patient is having back pain and she feels it may be from the medicaiton she stopped taking. Patient needs constant redirection on the physical therapy due to having many conversations of other things. Patient will benefit from skilled therapy to improve pelvic floor strength and coordination to reduce leakage.     Rehab Potential  Excellent    Clinical Impairments Affecting Rehab Potential  osteroporosis; fibromalgia    PT Frequency  1x / week    PT Duration  8 weeks    PT Treatment/Interventions  Biofeedback;Therapeutic activities;Therapeutic exercise;Neuromuscular re-education;Patient/family education;Manual techniques;Dry needling    PT Next Visit Plan  soft tissue work to perineum, , pelvic floor stretches and strength,     PT Home Exercise Plan  Access Code: [email or web address]    Recommended Other Services  MD signed initial evaluation    Consulted and Agree with Plan of Care  Patient       Patient will benefit from skilled therapeutic intervention in order to improve the  following deficits and impairments:  Pain, Decreased coordination, Increased muscle spasms, Decreased activity tolerance, Decreased strength  Visit Diagnosis: Muscle weakness (generalized)  Other muscle spasm  Unspecified lack of  coordination     Problem List Patient Active Problem List   Diagnosis Date Noted  . Essential hypertension, benign 09/13/2017  . Osteoporosis 01/13/2014  . Urgency incontinence 01/13/2014  . PAF (paroxysmal atrial fibrillation) ([place]) 03/31/2013  . Tobacco use disorder 08/06/2012  . Dyslipidemia 06/05/2012  . Anxiety 06/05/2012  . GERD 06/03/2008  . Hypothyroidism 07/07/2006    Earlie Counts, PT 12/26/17 11:03 AM   [facility] [address], [place], [place], [address] Phone: [phone]   Fax:  [phone]  Name: Haley [name] MRN: [id number] Date of Birth: December 30, 1945

## 2017-12-27 ENCOUNTER — Ambulatory Visit: Payer: PPO | Admitting: Physical Therapy

## 2018-01-04 ENCOUNTER — Ambulatory Visit: Payer: PPO | Admitting: Physical Therapy

## 2018-01-04 ENCOUNTER — Encounter: Payer: Self-pay | Admitting: Physical Therapy

## 2018-01-04 DIAGNOSIS — R279 Unspecified lack of coordination: Secondary | ICD-10-CM

## 2018-01-04 DIAGNOSIS — M62838 Other muscle spasm: Secondary | ICD-10-CM

## 2018-01-04 DIAGNOSIS — M6281 Muscle weakness (generalized): Secondary | ICD-10-CM

## 2018-01-04 NOTE — Therapy (Addendum)
[facility] [address] [place], [place], [address] Phone: [phone]   Fax:  [phone]  Physical Therapy Treatment  Patient Details  Name: Haley [name] MRN: [id number] Date of Birth: January 26, 1945 Referring Provider (PT): Dr. Lelon Frohlich   Encounter Date: 01/04/2018  PT End of Session - 01/04/18 1117    Visit Number  3    Date for PT Re-Evaluation  02/14/18    Authorization Type  [employer]    PT Start Time  1118   came late and had to go to the bathroom   PT Stop Time  1145    PT Time Calculation (min)  27 min    Activity Tolerance  Patient tolerated treatment well    Behavior During Therapy  [facility] for tasks assessed/performed       Past Medical History:  Diagnosis Date  . Allergy    SEASONAL  . Cataract    BILATERAL  . Fibromyalgia   . GERD (gastroesophageal reflux disease)   . H/O echocardiogram 04/28/08   EF>55% trace mitral regurgitation, No significant valvular pathology  . Hemorrhoids    internal  . History of stress test 02/08/2011   Normal Myocardial perfusion study, this is a low risk scan, No prior study available for comparison  . Hyperlipidemia   . MVP (mitral valve prolapse)    mild and MR  . OA (osteoarthritis)   . Osteoporosis   . Paroxysmal A-fib ([place])   . Sleep apnea   . Thyroid disease   . Varicosities of leg   . Vitamin D deficiency     Past Surgical History:  Procedure Laterality Date  . ABDOMINAL HYSTERECTOMY  1989  . APPENDECTOMY  1989   with hysterectomy  . BREAST EXCISIONAL BIOPSY Left 1995   Benign  . COLONOSCOPY  11-20-2000  . NOSE SURGERY  1990    There were no vitals filed for this visit.  Subjective Assessment - 01/04/18 1122    Subjective  I am not constipated. I drink my couple cups of coffee and go to the bathroom afterwards. My back is not hurting. I had left breast swelling after last visit. When I do my exercises I had more leakage. When  do my exercises I do not have to wear a pad. I do not leak when I sleep.     Patient Stated Goals  reduce urinary leakage    Currently in Pain?  No/denies    Multiple Pain Sites  No                       [facility] Adult PT Treatment/Exercise - 01/04/18 0001      Lumbar Exercises: Stretches   Active Hamstring Stretch  Right;Left;2 reps;30 seconds   sitting   Piriformis Stretch  Right;Left;2 reps;30 seconds   sitting   Other Lumbar Stretch Exercise  sitting hip adductor stretch bil hold 30 sec.       Lumbar Exercises: Aerobic   Nustep  7 min; seat #4; arm #11; level 2      Lumbar Exercises: Supine   Clam  15 reps;1 second    Clam Limitations  yellow band and pelvic floor contraction    Bridge with Ball Squeeze  15 reps;1 second   with pelvic floor exercises            PT Education - 01/04/18 1142    Education Details  Access Code: [email or web address]  Person(s) Educated  Patient    Methods  Explanation;Demonstration;Verbal cues;Handout    Comprehension  Returned demonstration;Verbalized understanding       PT Short Term Goals - 01/04/18 1122      PT SHORT TERM GOAL #1   Title  independent with initial HEP    Time  4    Period  Weeks    Status  Achieved        PT Long Term Goals - 12/20/17 1709      PT LONG TERM GOAL #1   Title  independent with HEP and understand how to progress herself    Time  8    Period  Weeks    Status  New    Target Date  02/14/18      PT LONG TERM GOAL #2   Title  urinary leakage with activities decreased >/= 75% due to increased pelvic floor strength >/= 4/5    Time  8    Period  Weeks    Status  New    Target Date  02/14/18      PT LONG TERM GOAL #3   Title  pain with intercourse decreased >/= 50% due to improve vaginal health, improved dryness and mobility of tissue    Time  8    Period  Weeks    Status  New    Target Date  02/14/18      PT LONG TERM GOAL #4   Title  understand how to use vaginal moisturizers and  lubricants for vaginal health    Time  8    Period  Weeks    Status  New    Target Date  02/14/18            Plan - 01/04/18 1117    Clinical Impression Statement  Patient reports when she does her pelvic floor exercises she does not have to wear a pad. When she does not exericse she will wear 3 pads. Patient reports her back pain is feeling better. Patietn contineus to need redirection for exercises due to being side tracked. Patient will beneift from skilled therapy to improve pelvic floor strength and coordination to reduce leakage.     Rehab Potential  Excellent    Clinical Impairments Affecting Rehab Potential  osteroporosis; fibromalgia    PT Frequency  1x / week    PT Duration  8 weeks    PT Treatment/Interventions  Biofeedback;Therapeutic activities;Therapeutic exercise;Neuromuscular re-education;Patient/family education;Manual techniques;Dry needling    PT Next Visit Plan  soft tissue work to perineum, , pelvic floor  strength,     PT Home Exercise Plan  Access Code: [email or web address]    Consulted and Agree with Plan of Care  Patient       Patient will benefit from skilled therapeutic intervention in order to improve the following deficits and impairments:  Pain, Decreased coordination, Increased muscle spasms, Decreased activity tolerance, Decreased strength  Visit Diagnosis: Muscle weakness (generalized)  Other muscle spasm  Unspecified lack of coordination     Problem List Patient Active Problem List   Diagnosis Date Noted  . Essential hypertension, benign 09/13/2017  . Osteoporosis 01/13/2014  . Urgency incontinence 01/13/2014  . PAF (paroxysmal atrial fibrillation) ([place]) 03/31/2013  . Tobacco use disorder 08/06/2012  . Dyslipidemia 06/05/2012  . Anxiety 06/05/2012  . GERD 06/03/2008  . Hypothyroidism 07/07/2006    Earlie Counts, PT 01/04/18 11:46 AM   [facility] [address] [place],  [place], [address] Phone: [phone]   Fax:  [phone]  Name: Haley [name] MRN: [id number] Date of Birth: [birth year] PHYSICAL THERAPY DISCHARGE SUMMARY  Visits from Start of Care: 3  Current functional level related to goals / functional outcomes: Not met. Patient has been diagnosed with ovarian cancer and will be having surgery.    Remaining deficits: See above.    Education / Equipment: HEP Plan: Patient agrees to discharge.  Patient goals were not met. Patient is being discharged due to a change in medical status.  Thank you for the referral. Earlie Counts, PT 01/11/18 8:30 AM  ?????

## 2018-01-04 NOTE — Patient Instructions (Signed)
Access Code: [email or web address]  URL: [email or web address]  Date: 01/04/2018  Prepared by: Earlie Counts   Exercises  Seated Hamstring Stretch - 2 reps - 1 sets - 30 sec hold - 1x daily - 7x weekly  Seated Piriformis Stretch with Trunk Bend - 2 reps - 1 sets - 30 sec hold - 1x daily - 7x weekly  Seated Hip Adductor Stretch - 2 reps - 1 sets - 30 sec hold - 1x daily - 7x weekly  Supine Pelvic Floor Contraction - 10 reps - 1 sets - 5 sec hold - 3x daily - 7x weekly  Supine Bridge with Mini Swiss Ball Between Knees - 10 reps - 2 sets - 1x daily - 7x weekly  Hooklying Isometric Clamshell - 10 reps - 2 sets - 1x daily - 7x weekly  Seated Pelvic Floor Contraction - 10 reps - 1 sets - 5 sec hold - 2x daily - 7x weekly  Patient Education  Osteoporosis Handout [facility] [address], [place], [place] [address] Phone # [phone] Fax [phone]

## 2018-01-06 ENCOUNTER — Encounter (HOSPITAL_COMMUNITY): Payer: Self-pay | Admitting: Emergency Medicine

## 2018-01-06 ENCOUNTER — Emergency Department (HOSPITAL_COMMUNITY)
Admission: EM | Admit: 2018-01-06 | Discharge: 2018-01-06 | Disposition: A | Discharge status: Home / Self Care | Payer: PPO | Attending: Emergency Medicine | Admitting: Emergency Medicine

## 2018-01-06 ENCOUNTER — Emergency Department (HOSPITAL_COMMUNITY): Payer: PPO

## 2018-01-06 ENCOUNTER — Other Ambulatory Visit: Payer: Self-pay

## 2018-01-06 DIAGNOSIS — I480 Paroxysmal atrial fibrillation: Secondary | ICD-10-CM | POA: Diagnosis not present

## 2018-01-06 DIAGNOSIS — E039 Hypothyroidism, unspecified: Secondary | ICD-10-CM | POA: Insufficient documentation

## 2018-01-06 DIAGNOSIS — N839 Noninflammatory disorder of ovary, fallopian tube and broad ligament, unspecified: Secondary | ICD-10-CM | POA: Diagnosis not present

## 2018-01-06 DIAGNOSIS — Z7982 Long term (current) use of aspirin: Secondary | ICD-10-CM | POA: Diagnosis not present

## 2018-01-06 DIAGNOSIS — F17290 Nicotine dependence, other tobacco product, uncomplicated: Secondary | ICD-10-CM | POA: Insufficient documentation

## 2018-01-06 DIAGNOSIS — K5900 Constipation, unspecified: Secondary | ICD-10-CM | POA: Diagnosis not present

## 2018-01-06 DIAGNOSIS — R1084 Generalized abdominal pain: Secondary | ICD-10-CM | POA: Diagnosis present

## 2018-01-06 DIAGNOSIS — N838 Other noninflammatory disorders of ovary, fallopian tube and broad ligament: Secondary | ICD-10-CM

## 2018-01-06 DIAGNOSIS — N83291 Other ovarian cyst, right side: Secondary | ICD-10-CM | POA: Diagnosis not present

## 2018-01-06 DIAGNOSIS — R109 Unspecified abdominal pain: Secondary | ICD-10-CM | POA: Diagnosis not present

## 2018-01-06 DIAGNOSIS — Z79899 Other long term (current) drug therapy: Secondary | ICD-10-CM | POA: Insufficient documentation

## 2018-01-06 DIAGNOSIS — R188 Other ascites: Secondary | ICD-10-CM | POA: Diagnosis not present

## 2018-01-06 DIAGNOSIS — R110 Nausea: Secondary | ICD-10-CM | POA: Diagnosis not present

## 2018-01-06 LAB — LIPASE, BLOOD: Lipase: 32 U/L (ref 11–51)

## 2018-01-06 LAB — URINALYSIS, ROUTINE W REFLEX MICROSCOPIC
Bilirubin Urine: NEGATIVE
Glucose, UA: NEGATIVE mg/dL
Hgb urine dipstick: NEGATIVE
Ketones, ur: NEGATIVE mg/dL
Leukocytes, UA: NEGATIVE
Nitrite: NEGATIVE
PH: 9 — AB (ref 5.0–8.0)
Protein, ur: NEGATIVE mg/dL
Specific Gravity, Urine: 1.034 — ABNORMAL HIGH (ref 1.005–1.030)

## 2018-01-06 LAB — COMPREHENSIVE METABOLIC PANEL
ALT: 16 U/L (ref 0–44)
AST: 23 U/L (ref 15–41)
Albumin: 3.7 g/dL (ref 3.5–5.0)
Alkaline Phosphatase: 79 U/L (ref 38–126)
Anion gap: 7 (ref 5–15)
BUN: 7 mg/dL — AB (ref 8–23)
CO2: 27 mmol/L (ref 22–32)
Calcium: 9.1 mg/dL (ref 8.9–10.3)
Chloride: 104 mmol/L (ref 98–111)
Creatinine, Ser: 0.64 mg/dL (ref 0.44–1.00)
GFR calc Af Amer: >60 mL/min (ref >60)
GFR calc non Af Amer: >60 mL/min (ref >60)
Glucose, Bld: 127 mg/dL — ABNORMAL HIGH (ref 70–99)
Potassium: 3.4 mmol/L — ABNORMAL LOW (ref 3.5–5.1)
SODIUM: 138 mmol/L (ref 135–145)
Total Bilirubin: 0.5 mg/dL (ref 0.3–1.2)
Total Protein: 7.7 g/dL (ref 6.5–8.1)

## 2018-01-06 LAB — CBC WITH DIFFERENTIAL/PLATELET
ABS IMMATURE GRANULOCYTES: 0.07 10*3/uL (ref 0.00–0.07)
Basophils Absolute: 0.1 10*3/uL (ref 0.0–0.1)
Basophils Relative: 0 %
Eosinophils Absolute: 0.7 10*3/uL — ABNORMAL HIGH (ref 0.0–0.5)
Eosinophils Relative: 6 %
HCT: 42.1 % (ref 36.0–46.0)
Hemoglobin: 13.2 g/dL (ref 12.0–15.0)
Immature Granulocytes: 1 %
Lymphocytes Relative: 13 %
Lymphs Abs: 1.8 10*3/uL (ref 0.7–4.0)
MCH: 28.1 pg (ref 26.0–34.0)
MCHC: 31.4 g/dL (ref 30.0–36.0)
MCV: 89.6 fL (ref 80.0–100.0)
Monocytes Absolute: 0.8 10*3/uL (ref 0.1–1.0)
Monocytes Relative: 6 %
NEUTROS ABS: 9.7 10*3/uL — AB (ref 1.7–7.7)
NEUTROS PCT: 74 %
Platelets: 336 10*3/uL (ref 150–400)
RBC: 4.7 MIL/uL (ref 3.87–5.11)
RDW: 13.9 % (ref 11.5–15.5)
WBC: 13.1 10*3/uL — ABNORMAL HIGH (ref 4.0–10.5)
nRBC: 0 % (ref 0.0–0.2)

## 2018-01-06 MED ORDER — SODIUM CHLORIDE 0.9 % IV BOLUS
1000.0000 mL | Freq: Once | INTRAVENOUS | Status: AC
  Administered 2018-01-06: 1000 mL via INTRAVENOUS

## 2018-01-06 MED ORDER — ONDANSETRON HCL 4 MG/2ML IJ SOLN
4.0000 mg | Freq: Once | INTRAMUSCULAR | Status: AC
  Administered 2018-01-06: 4 mg via INTRAVENOUS
  Filled 2018-01-06: qty 2

## 2018-01-06 MED ORDER — HYDROCODONE-ACETAMINOPHEN 5-325 MG PO TABS
1.0000 | ORAL_TABLET | Freq: Once | ORAL | Status: AC
  Administered 2018-01-06: 1 via ORAL
  Filled 2018-01-06: qty 1

## 2018-01-06 MED ORDER — ACETAMINOPHEN-CODEINE #3 300-30 MG PO TABS: 1.0000 | ORAL_TABLET | Freq: Four times a day (QID) | ORAL | 0 refills | Status: DC | PRN

## 2018-01-06 MED ORDER — DICYCLOMINE HCL 10 MG/ML IM SOLN
20.0000 mg | Freq: Once | INTRAMUSCULAR | Status: AC
  Administered 2018-01-06: 20 mg via INTRAMUSCULAR

## 2018-01-06 MED ORDER — DICYCLOMINE HCL 10 MG/ML IM SOLN
INTRAMUSCULAR | Status: AC
  Filled 2018-01-06: qty 2

## 2018-01-06 MED ORDER — ONDANSETRON 4 MG PO TBDP: 4.0000 mg | ORAL_TABLET | Freq: Three times a day (TID) | ORAL | 0 refills | Status: DC | PRN

## 2018-01-06 MED ORDER — LORAZEPAM 2 MG/ML IJ SOLN
0.5000 mg | Freq: Once | INTRAMUSCULAR | Status: AC
  Administered 2018-01-06: 0.5 mg via INTRAVENOUS
  Filled 2018-01-06: qty 1

## 2018-01-06 MED ORDER — IOPAMIDOL (ISOVUE-300) INJECTION 61%
100.0000 mL | Freq: Once | INTRAVENOUS | Status: AC | PRN
  Administered 2018-01-06: 100 mL via INTRAVENOUS

## 2018-01-06 MED ORDER — PROCHLORPERAZINE EDISYLATE 10 MG/2ML IJ SOLN
5.0000 mg | Freq: Once | INTRAMUSCULAR | Status: AC
  Administered 2018-01-06: 5 mg via INTRAVENOUS
  Filled 2018-01-06: qty 2

## 2018-01-06 MED ORDER — FENTANYL CITRATE (PF) 100 MCG/2ML IJ SOLN
50.0000 ug | Freq: Once | INTRAMUSCULAR | Status: AC
  Administered 2018-01-06: 50 ug via INTRAVENOUS
  Filled 2018-01-06: qty 2

## 2018-01-06 MED ORDER — ONDANSETRON 4 MG PO TBDP
4.0000 mg | ORAL_TABLET | Freq: Once | ORAL | Status: AC
  Administered 2018-01-06: 4 mg via ORAL
  Filled 2018-01-06: qty 1

## 2018-01-06 NOTE — Discharge Instructions (Addendum)
As we discussed, your CT scan is very concerning for an ovarian mass likely cancer that has spread to the lining of your abdominal wall.  You should follow-up with your gynecologist and primary doctor soon as possible as well as see the oncologist.  Return to the ED with worsening pain, nausea, fever, any other concerns.

## 2018-01-06 NOTE — ED Provider Notes (Signed)
[facility] EMERGENCY DEPARTMENT Provider Note   CSN: [id number] Arrival date & time: 01/06/18  0037     History   Chief Complaint Chief Complaint  Patient presents with  . Abdominal Pain    HPI Haley [name] is a 72 y.o. female.  Patient with history of acid reflux disease, fibromyalgia, atrial fibrillation presenting with abdominal "cramping" that has been progressively worsening since this afternoon.  States she woke up this morning feeling well but had no appetite which is not unusual for her.  Had minimal to eat for breakfast.  She did have a bowel movement this morning which was normal.  Did not want to eat lunch or dinner.  Throughout the afternoon she had progressive "swelling" of her stomach with intermittent cramping that comes and goes lasting for a few minutes at a time.  Associated with nausea but no vomiting.  States her bowel movements have been normal.  She was notably seen by her PCP on December 4 for drug-induced constipation but states this has resolved.  And she reports the pain she is having today is different.  Denies any pain with urination or blood in the urine.  Denies any change in her bowel movements.  No chest pain or shortness of breath.  No fever.  She is concerned she could have a bowel obstruction.  History of hysterectomy and appendectomy.  The history is provided by the patient.  Abdominal Pain   Associated symptoms include nausea and constipation. Pertinent negatives include fever, vomiting, dysuria, hematuria, headaches, arthralgias and myalgias.    Past Medical History:  Diagnosis Date  . Allergy    SEASONAL  . Cataract    BILATERAL  . Fibromyalgia   . GERD (gastroesophageal reflux disease)   . H/O echocardiogram 04/28/08   EF>55% trace mitral regurgitation, No significant valvular pathology  . Hemorrhoids    internal  . History of stress test 02/08/2011   Normal Myocardial perfusion study, this is a low risk scan, No prior study available  for comparison  . Hyperlipidemia   . MVP (mitral valve prolapse)    mild and MR  . OA (osteoarthritis)   . Osteoporosis   . Paroxysmal A-fib ([place])   . Sleep apnea   . Thyroid disease   . Varicosities of leg   . Vitamin D deficiency     Patient Active Problem List   Diagnosis Date Noted  . Essential hypertension, benign 09/13/2017  . Osteoporosis 01/13/2014  . Urgency incontinence 01/13/2014  . PAF (paroxysmal atrial fibrillation) ([place]) 03/31/2013  . Tobacco use disorder 08/06/2012  . Dyslipidemia 06/05/2012  . Anxiety 06/05/2012  . GERD 06/03/2008  . Hypothyroidism 07/07/2006    Past Surgical History:  Procedure Laterality Date  . ABDOMINAL HYSTERECTOMY  1989  . APPENDECTOMY  1989   with hysterectomy  . BREAST EXCISIONAL BIOPSY Left 1995   Benign  . COLONOSCOPY  11-20-2000  . NOSE SURGERY  1990     OB History    Gravida  3   Para  3   Term      Preterm      AB      Living  2     SAB      TAB      Ectopic      Multiple      Live Births               Home Medications    Prior to Admission medications   Medication  Sig Start Date End Date Taking? Authorizing Provider  ALPRAZolam Duanne Moron) 0.5 MG tablet TAKE ONE TABLET BY MOUTH ONCE DAILY AS NEEDED 05/10/17   Marletta Lor, MD  aspirin 81 MG tablet Take 81 mg by mouth daily.      [provider]  Calcium Carbonate (CALTRATE 600) 1500 MG TABS Take by mouth.      [provider]  cholecalciferol (VITAMIN D) 1000 UNITS tablet Take 1,000 Units by mouth daily.     [provider]  clobetasol cream (TEMOVATE) 0.05 % Apply twice a week first week then once weekly as needed 06/03/14   Terrance Mass, MD  fesoterodine (TOVIAZ) 4 MG TB24 tablet Take 4 mg by mouth daily.    [provider]  fluticasone (CUTIVATE) 0.05 % cream Apply 1 application topically 2 (two) times daily.  08/03/12   [provider]  fluticasone Asencion Islam) 50 MCG/ACT nasal spray Use 2  sprays in each nostril daily 05/11/17   Marletta Lor, MD  levothyroxine (SYNTHROID, LEVOTHROID) 75 MCG tablet Take 1 tablet (75 mcg total) by mouth daily. 11/05/17   [place], Tarynn G, MD  losartan (COZAAR) 25 MG tablet Take 1 tablet (25 mg total) by mouth daily. 06/09/17 09/07/17  Troy Sine, MD  meloxicam (MOBIC) 15 MG tablet Take 1 tablet (15 mg total) by mouth daily. 12/21/16   Marletta Lor, MD  metoprolol succinate (TOPROL-XL) 25 MG 24 hr tablet Take 1 tablet (25 mg total) by mouth daily. 09/17/17   [place], Markesha G, MD  Multiple Vitamins-Minerals (MULTIVITAMIN WITH MINERALS) tablet Take 1 tablet by mouth daily.      [provider]  Omega-3 Fatty Acids (FISH OIL) 1000 MG CAPS Take 2 capsules by mouth 2 (two) times daily.    [provider]  omeprazole (PRILOSEC) 40 MG capsule Take 1 capsule (40 mg total) by mouth daily. 09/26/17   [place], Minela G, MD  simvastatin (ZOCOR) 20 MG tablet Take 1 tablet (20 mg total) by mouth at bedtime. 05/11/17   Troy Sine, MD  tretinoin (RETIN-A) 0.025 % cream Apply topically at bedtime.  06/19/12   [provider]  vitamin C (ASCORBIC ACID) 500 MG tablet Take 500 mg by mouth 2 (two) times daily.     [provider]    Family History Family History  Problem Relation Age of Onset  . Diabetes Daughter   . Diabetes Mother   . Hypertension Mother   . Heart disease Mother   . Stroke Maternal Grandmother   . Colon cancer Neg Hx     Social History Social History   Tobacco Use  . Smoking status: Current Every Day Smoker    Packs/day: 0.50    Years: 10.00    Pack years: 5.00    Types: Cigars  . Smokeless tobacco: Never Used  . Tobacco comment: peach flavor   Substance Use Topics  . Alcohol use: No    Alcohol/week: 0.0 standard drinks  . Drug use: No     Allergies   Tramadol hcl and Tramadol   Review of Systems Review of Systems  Constitutional: Positive for appetite change. Negative for  fever.  Gastrointestinal: Positive for abdominal pain, constipation and nausea. Negative for vomiting.  Genitourinary: Negative for dysuria, hematuria, vaginal bleeding and vaginal discharge.  Musculoskeletal: Negative for arthralgias and myalgias.  Neurological: Negative for dizziness, weakness and headaches.    all other systems are negative except as noted in the HPI and  PMH.    Physical Exam Updated Vital Signs BP (!) 147/81 (BP Location: Right Arm)   Pulse 76   Temp 98.2 F (36.8 C) (Oral)   Resp 16   Ht 5\' 2"  (1.575 m)   Wt 64.9 kg   SpO2 98%   BMI 26.16 kg/m   Physical Exam Vitals signs and nursing note reviewed.  Constitutional:      General: She is not in acute distress.    Appearance: She is well-developed.     Comments: Flat affect  HENT:     Head: Normocephalic and atraumatic.     Mouth/Throat:     Pharynx: No oropharyngeal exudate.  Eyes:     Conjunctiva/sclera: Conjunctivae normal.     Pupils: Pupils are equal, round, and reactive to light.  Neck:     Musculoskeletal: Normal range of motion and neck supple.     Comments: No meningismus. Cardiovascular:     Rate and Rhythm: Normal rate and regular rhythm.     Heart sounds: Normal heart sounds. No murmur.  Pulmonary:     Effort: Pulmonary effort is normal. No respiratory distress.     Breath sounds: Normal breath sounds.  Abdominal:     General: There is distension.     Palpations: Abdomen is soft.     Tenderness: There is abdominal tenderness. There is no guarding or rebound.     Comments: Abdomen mildly distended, diffusely tender, no guarding or rebound  Musculoskeletal: Normal range of motion.        General: No tenderness.  Skin:    General: Skin is warm.  Neurological:     Mental Status: She is alert and oriented to person, place, and time.     Cranial Nerves: No cranial nerve deficit.     Motor: No abnormal muscle tone.     Coordination: Coordination normal.     Comments: No ataxia on  finger to nose bilaterally. No pronator drift. 5/5 strength throughout. CN 2-12 intact.Equal grip strength. Sensation intact.   Psychiatric:        Behavior: Behavior normal.      ED Treatments / Results  Labs (all labs ordered are listed, but only abnormal results are displayed) Labs Reviewed  CBC WITH DIFFERENTIAL/PLATELET - Abnormal; Notable for the following components:      Result Value   WBC 13.1 (*)    Neutro Abs 9.7 (*)    Eosinophils Absolute 0.7 (*)    All other components within normal limits  COMPREHENSIVE METABOLIC PANEL - Abnormal; Notable for the following components:   Potassium 3.4 (*)    Glucose, Bld 127 (*)    BUN 7 (*)    All other components within normal limits  URINALYSIS, ROUTINE W REFLEX MICROSCOPIC - Abnormal; Notable for the following components:   APPearance HAZY (*)    Specific Gravity, Urine 1.034 (*)    pH 9.0 (*)    All other components within normal limits  LIPASE, BLOOD    EKG None  Radiology Ct Abdomen Pelvis W Contrast  Result Date: 01/06/2018 CLINICAL DATA:  Acute onset of generalized abdominal pain and cramping. Abdominal bloating. EXAM: CT ABDOMEN AND PELVIS WITH CONTRAST TECHNIQUE: Multidetector CT imaging of the abdomen and pelvis was performed using the standard protocol following bolus administration of intravenous contrast. CONTRAST:  139mL ISOVUE-300 IOPAMIDOL (ISOVUE-300) INJECTION 61% COMPARISON:  CT of the abdomen performed 12/11/2006, and abdominal ultrasound performed 06/11/2008 FINDINGS: Lower chest: The visualized lung bases are grossly clear. The  visualized portions of the mediastinum are unremarkable. Hepatobiliary: The liver is unremarkable in appearance. The gallbladder is decompressed and not well assessed given surrounding ascites. The common bile duct is normal in caliber. Pancreas: The pancreas is within normal limits. Spleen: The spleen is unremarkable in appearance. Adrenals/Urinary Tract: The adrenal glands are  unremarkable in appearance. The kidneys are unremarkable. There is no evidence of hydronephrosis. No renal or ureteral stones are identified. No perinephric stranding is seen. Stomach/Bowel: There appears to be diffuse nodularity along the omentum at the left side of the abdomen, extending into the mesentery at the left mid abdomen, concerning for peritoneal carcinomatosis. The small bowel is diffusely filled with fluid, but remains normal in caliber. Wall thickening is noted at the distal ileum adjacent to the ovarian mass described below, and bowel loops appear somewhat adherent to the ovarian mass. The stomach is unremarkable in appearance. The appendix is normal in caliber, without evidence of appendicitis. The colon is largely decompressed and grossly unremarkable in appearance. Vascular/Lymphatic: Scattered calcification is seen along the abdominal aorta and its branches. The abdominal aorta is otherwise grossly unremarkable. The inferior vena cava is grossly unremarkable. No retroperitoneal lymphadenopathy is seen. No pelvic sidewall lymphadenopathy is identified. Reproductive: The bladder is mildly distended and grossly unremarkable. The patient is status post hysterectomy. A multilobulated cystic mass is noted at the right adnexa, measuring approximately 5.9 x 4.0 cm, with nodular components, concerning for primary ovarian malignancy. Two cystic lesions are noted at the left adnexa, measuring 5.8 cm and 3.2 cm, of uncertain significance. Other: Small volume ascites is noted within the abdomen and pelvis. Musculoskeletal: No acute osseous abnormalities are identified. The visualized musculature is unremarkable in appearance. IMPRESSION: 1. Complex cystic mass at the right adnexa, measuring 5.9 x 4.0 cm, with nodular components, concerning for primary ovarian malignancy. 2. Diffuse nodularity along the omentum at the left side of the abdomen, extending into the mesentery at the left mid abdomen, concerning  for peritoneal carcinomatosis. 3. Wall thickening at the distal ileum adjacent to the ovarian mass; bowel loops appear somewhat adherent to the ovarian mass. Bowel infiltration with tumor cannot be excluded. No evidence of bowel obstruction at this time. 4. Small volume ascites within the abdomen and pelvis. Aortic Atherosclerosis (ICD10-I70.0). These results were called by telephone at the time of interpretation on 01/06/2018 at 2:56 am to Dr. Ezequiel Essex, who verbally acknowledged these results. Electronically Signed   By: Garald Balding M.D.   On: 01/06/2018 02:57    Procedures Procedures (including critical care time)  Medications Ordered in ED Medications  ondansetron (ZOFRAN) injection 4 mg (4 mg Intravenous Given 01/06/18 0123)     Initial Impression / Assessment and Plan / ED Course  I have reviewed the triage vital signs and the nursing notes.  Pertinent labs & imaging results that were available during my care of the patient were reviewed by me and considered in my medical decision making (see chart for details).    Abdominal cramping with nausea since this afternoon.  Abdomen soft without peritoneal signs.  Patient given IV fluids and symptom control.  Labs show leukocytosis of 13. CT scan will be obtained to further evaluate and rule out bowel obstruction.  CT results concerning for ovarian malignancy with peritoneal carcinomatosis.  No bowel obstruction.  Results discussed with patient and her husband at bedside.  She does have a gynecologist in [place] Dr. Phineas Real.  Patient wishes to go home.  Discussed with her that she could  be admitted for pain and nausea control if necessary though it is likely not much will be done to further her diagnosis over the weekend.  She did develop some vomiting after receiving Vicodin and was given additional IV fluids and nausea medication.  On recheck, patient is resting comfortably and tolerating PO. She still wishes to go home.  Stressed importance of followup with her PCP and gynecologist and electronic messages sent to each. Will attempt symptom control with tylenol #3 and zofran.  Return precautions discussed.   Final Clinical Impressions(s) / ED Diagnoses   Final diagnoses:  Ovarian mass, right    ED Discharge Orders    None       Malay Fantroy, Annie Main, MD 01/06/18 [phone]

## 2018-01-06 NOTE — ED Triage Notes (Addendum)
Pt c/o abd pain, cramping & bloating that started this afternoon. Pain worse in LLQ & RLQ. Last BM was this AM

## 2018-01-08 ENCOUNTER — Encounter: Payer: Self-pay | Admitting: Gynecology

## 2018-01-08 ENCOUNTER — Telehealth: Payer: Self-pay | Admitting: *Deleted

## 2018-01-08 ENCOUNTER — Ambulatory Visit (INDEPENDENT_AMBULATORY_CARE_PROVIDER_SITE_OTHER): Payer: PPO | Admitting: Gynecology

## 2018-01-08 VITALS — BP 124/82

## 2018-01-08 DIAGNOSIS — N838 Other noninflammatory disorders of ovary, fallopian tube and broad ligament: Secondary | ICD-10-CM

## 2018-01-08 NOTE — Progress Notes (Signed)
    [place] January 22, 1945 [id number]        72 y.o.  G3P3 presents having been seen 2 days ago in the emergency room due to abdominal pain and bloating.  Onset several days before presentation to the emergency room.  Doing well previously.  CT scan 01/06/2018 showed a complex cystic mass of the right adnexa measuring 5.9 x 4.0 cm with nodular components.  Also diffuse nodularity along the omentum extending into the mesentery.  There was wall thickening of the distal ileum adjacent to the ovarian mass with bowel loops somewhat adherent to the ovarian mass.  Small amount of ascites in the abdomen and pelvis.  No evidence of lymphadenopathy.  The patient notes that she is feeling somewhat better than when she was in the emergency room.  She has had several liquid stools and tolerating liquids.  Past medical history,surgical history, problem list, medications, allergies, family history and social history were all reviewed and documented in the EPIC chart.  Directed ROS with pertinent positives and negatives documented in the history of present illness/assessment and plan.  Exam: Caryn Bee assistant Vitals:   01/08/18 0959  BP: 124/82   General appearance:  Normal Abdomen soft with active bowel sounds.  Mild diffuse distention noted.  No gross masses or tenderness. Pelvic external BUS vagina with atrophic changes.  Bimanual exam with pelvic fullness at the vaginal cuff.  Assessment/Plan:  72 y.o. G3P3 with new onset abdominal symptoms of bloating and pain.  CT scan consistent with right ovarian cancer with peritoneal spread.  Bimanual exam suggests pelvic mass.  Recent exam 11/2017 was normal.  Discussed situation with the patient and most likely diagnosis of ovarian carcinoma.  Need for immediate evaluation and treatment by gynecologic oncologist discussed.  Will check baseline Ca1 25 today and patient will follow-up with the gynecologic oncologist.  We discussed the need for pushing fluids and  soft diet for now.    Anastasio Auerbach MD, 10:21 AM 01/08/2018

## 2018-01-08 NOTE — Telephone Encounter (Addendum)
Per Dr.Fontaine "Received note from emergency room. CT scan suggests ovarian carcinoma. Patient needs ASAP referral to gynecologic oncology"  Melissa told me patient can be seen on 01/11/18 @ 3pm with Dr. Gerarda Fraction, however she is in OR that am, if any changes in OR, Dr. Gerarda Fraction office will call and let the patient know.   I called and left message for patient to call.

## 2018-01-08 NOTE — Patient Instructions (Signed)
Office will call you with the appointment with the GYN oncology doctor.

## 2018-01-08 NOTE — Telephone Encounter (Signed)
Patient informed with time and date.  

## 2018-01-09 ENCOUNTER — Ambulatory Visit: Payer: PPO | Admitting: Family Medicine

## 2018-01-09 ENCOUNTER — Ambulatory Visit: Payer: Self-pay | Admitting: *Deleted

## 2018-01-09 DIAGNOSIS — Z0289 Encounter for other administrative examinations: Secondary | ICD-10-CM

## 2018-01-09 LAB — CA 125: CA 125: 3004 U/mL — ABNORMAL HIGH (ref <35)

## 2018-01-09 NOTE — Telephone Encounter (Signed)
  Reason for Disposition . Mild hoarseness  Answer Assessment - Initial Assessment Questions 1. DESCRIPTION: "Describe your voice."     hoarse 2. ONSET: "When did the hoarseness begin?"     2 days congestions 1week ago 3. COUGH: "Is there a cough?" If so, ask: "How bad?"     no 4. FEVER: "Do you have a fever?" If so, ask: "What is your temperature, how was it measured, and when did it start?"     no 5. RESPIRATORY STATUS: "Describe your breathing."      no 6. ALLERGIES: "Any allergy symptoms?" If so, ask: "What are they?"     Runny nose sneezing 7. IRRITANTS: "Do you smoke?" "Have you been exposed to any irritating fumes?"     no 8. CAUSE: "What do you think is causing the hoarseness?"     unsure 9. OTHER SYMPTOMS: "Do you have any other symptoms?" (e.g., sore throat, swelling, foreign body, rash)     no 10. PREGNANCY: "Is there any chance you are pregnant?" "When was your last menstrual period?"     N/A  Protocols used: HOARSENESS-A-AH

## 2018-01-09 NOTE — Telephone Encounter (Signed)
Returned call to patient who states she is has called because she is hoarse. She states she went to the ED on Saturday and had severe abdominal pain.  She stated that she got sick and vomited up something they gave her. Since then she has had a hoarse voice. She states that she had some allergy symptoms as well.  She denies fever. She denies sore throat and sinus. Per protocol pt  was given home care advice. Pt verbalized understanding of all instructions.

## 2018-01-09 NOTE — Telephone Encounter (Signed)
Pt calling stating that she has hoarness and sinus congestion and she is having surgery soon and need something for her symptoms please call pt at [phone]

## 2018-01-09 NOTE — Telephone Encounter (Signed)
Call placed to patient. Spoke with patient husband who states he will have her call back.

## 2018-01-11 ENCOUNTER — Encounter: Payer: Self-pay | Admitting: Oncology

## 2018-01-11 ENCOUNTER — Inpatient Hospital Stay: Payer: PPO | Attending: Obstetrics | Admitting: Obstetrics

## 2018-01-11 ENCOUNTER — Telehealth: Payer: Self-pay | Admitting: Physical Therapy

## 2018-01-11 ENCOUNTER — Telehealth: Payer: Self-pay | Admitting: Oncology

## 2018-01-11 ENCOUNTER — Encounter: Payer: Self-pay | Admitting: Obstetrics

## 2018-01-11 VITALS — BP 141/76 | HR 82 | Temp 97.6°F | Resp 18 | Ht 62.0 in | Wt 145.0 lb

## 2018-01-11 DIAGNOSIS — C801 Malignant (primary) neoplasm, unspecified: Secondary | ICD-10-CM | POA: Diagnosis not present

## 2018-01-11 DIAGNOSIS — R1900 Intra-abdominal and pelvic swelling, mass and lump, unspecified site: Secondary | ICD-10-CM

## 2018-01-11 DIAGNOSIS — C800 Disseminated malignant neoplasm, unspecified: Secondary | ICD-10-CM

## 2018-01-11 DIAGNOSIS — C786 Secondary malignant neoplasm of retroperitoneum and peritoneum: Secondary | ICD-10-CM | POA: Diagnosis not present

## 2018-01-11 DIAGNOSIS — C569 Malignant neoplasm of unspecified ovary: Secondary | ICD-10-CM

## 2018-01-11 MED ORDER — OXYCODONE HCL 5 MG PO TABS: 5.0000 mg | ORAL_TABLET | ORAL | 0 refills | Status: DC | PRN

## 2018-01-11 MED ORDER — GABAPENTIN 100 MG PO CAPS: 100.0000 mg | ORAL_CAPSULE | Freq: Every day | ORAL | 0 refills | Status: DC | PRN

## 2018-01-11 NOTE — Telephone Encounter (Signed)
Noted.  FYI sent to Dr. Martinique. Patient no showed for appointment on 01/09/18.

## 2018-01-11 NOTE — Progress Notes (Signed)
[place] [facility] Note: New Patient First Visit   Consult was requested by Dr. Donalynn Furlong for pelvic mass/carcinomatosis concerning for ovarian cancer   No chief complaint on file.   GYN Oncologic Summary 1. TBD o .  HPI: [name]  is a nice 73 y.o.  P2  She has had urinary incontinence about 1 year. This started worsening in Sept 2019. She saw urology and was told she was "backed up" in her words. She was given a medication for the incontinence and associates the use of this medication with the start of constipation. Thinking it was the medication she tried to change her diet to help. When the second week came around she started noticing back pain. By the 3rd week this was fairly severe. After discussing with her PCP she was started on Miralax and colace and mag citrate x 1. This helped with her BM but she was starting to feel swollen in her abdomen.   On 01/06/18 she felt significant pain and cramps and due to pain went to [facility]. Imaging revealed a pelvic mass and carcinomatosis concerning for ovarian cancer.   01/06/18 CT notes: IMPRESSION: 1. Complex cystic mass at the right adnexa, measuring 5.9 x 4.0 cm, with nodular components, concerning for primary ovarian malignancy. 2. Diffuse nodularity along the omentum at the left side of the abdomen, extending into the mesentery at the left mid abdomen, concerning for peritoneal carcinomatosis. 3. Wall thickening at the distal ileum adjacent to the ovarian mass; bowel loops appear somewhat adherent to the ovarian mass. Bowel infiltration with tumor cannot be excluded. No evidence of bowel obstruction at this time. 4. Small volume ascites within the abdomen and pelvis.  On 12/30 +/- she had some nausea/emesis. She currently denies nausea. She is passing small amounts of flatus but wishes it was more to help relieve some of her "gas pain". She had a BM day before  yesterday. She has had no appetite for months. Her daughter feels the patient has lost weight in the past 6 months, however the patient denies this. She does note early satiety. States her urinary incontinence is not as significant as it was in September, but admits to decrease po intake.  She followed up with Dr.Fontaine, who upon reviewing her case encouraged her to followup here in our office, also out of concern for ovarian cancer. CA125 was drawn with Dr. Phineas Real and is quite elevated at 3004    Imported EPIC Oncologic History:   No history exists.    Measurement of disease: CA125 . 01/08/18 = 3004  Recent Labs    01/08/18 1031  CA125 3,004*   Radiology: Ct Abdomen Pelvis W Contrast  Result Date: 01/06/2018 CLINICAL DATA:  Acute onset of generalized abdominal pain and cramping. Abdominal bloating. EXAM: CT ABDOMEN AND PELVIS WITH CONTRAST TECHNIQUE: Multidetector CT imaging of the abdomen and pelvis was performed using the standard protocol following bolus administration of intravenous contrast. CONTRAST:  157mL ISOVUE-300 IOPAMIDOL (ISOVUE-300) INJECTION 61% COMPARISON:  CT of the abdomen performed 12/11/2006, and abdominal ultrasound performed 06/11/2008 FINDINGS: Lower chest: The visualized lung bases are grossly clear. The visualized portions of the mediastinum are unremarkable. Hepatobiliary: The liver is unremarkable in appearance. The gallbladder is decompressed and not well assessed given surrounding ascites. The common bile duct is normal in caliber. Pancreas: The pancreas is within normal limits. Spleen: The spleen is unremarkable in appearance. Adrenals/Urinary Tract: The adrenal  glands are unremarkable in appearance. The kidneys are unremarkable. There is no evidence of hydronephrosis. No renal or ureteral stones are identified. No perinephric stranding is seen. Stomach/Bowel: There appears to be diffuse nodularity along the omentum at the left side of the abdomen, extending  into the mesentery at the left mid abdomen, concerning for peritoneal carcinomatosis. The small bowel is diffusely filled with fluid, but remains normal in caliber. Wall thickening is noted at the distal ileum adjacent to the ovarian mass described below, and bowel loops appear somewhat adherent to the ovarian mass. The stomach is unremarkable in appearance. The appendix is normal in caliber, without evidence of appendicitis. The colon is largely decompressed and grossly unremarkable in appearance. Vascular/Lymphatic: Scattered calcification is seen along the abdominal aorta and its branches. The abdominal aorta is otherwise grossly unremarkable. The inferior vena cava is grossly unremarkable. No retroperitoneal lymphadenopathy is seen. No pelvic sidewall lymphadenopathy is identified. Reproductive: The bladder is mildly distended and grossly unremarkable. The patient is status post hysterectomy. A multilobulated cystic mass is noted at the right adnexa, measuring approximately 5.9 x 4.0 cm, with nodular components, concerning for primary ovarian malignancy. Two cystic lesions are noted at the left adnexa, measuring 5.8 cm and 3.2 cm, of uncertain significance. Other: Small volume ascites is noted within the abdomen and pelvis. Musculoskeletal: No acute osseous abnormalities are identified. The visualized musculature is unremarkable in appearance. IMPRESSION: 1. Complex cystic mass at the right adnexa, measuring 5.9 x 4.0 cm, with nodular components, concerning for primary ovarian malignancy. 2. Diffuse nodularity along the omentum at the left side of the abdomen, extending into the mesentery at the left mid abdomen, concerning for peritoneal carcinomatosis. 3. Wall thickening at the distal ileum adjacent to the ovarian mass; bowel loops appear somewhat adherent to the ovarian mass. Bowel infiltration with tumor cannot be excluded. No evidence of bowel obstruction at this time. 4. Small volume ascites within the  abdomen and pelvis. Aortic Atherosclerosis (ICD10-I70.0). These results were called by telephone at the time of interpretation on 01/06/2018 at 2:56 am to Dr. Ezequiel Essex, who verbally acknowledged these results. Electronically Signed   By: Garald Balding M.D.   On: 01/06/2018 02:57   Mm 3d Screen Breast Bilateral  Result Date: 12/18/2017 CLINICAL DATA:  Screening. EXAM: DIGITAL SCREENING BILATERAL MAMMOGRAM WITH TOMO AND CAD COMPARISON:  Previous exam(s). ACR Breast Density Category c: The breast tissue is heterogeneously dense, which may obscure small masses. FINDINGS: There are no findings suspicious for malignancy. Images were processed with CAD. IMPRESSION: No mammographic evidence of malignancy. A result letter of this screening mammogram will be mailed directly to the patient. RECOMMENDATION: Screening mammogram in one year. (Code:SM-B-01Y) BI-RADS CATEGORY  1: Negative. Electronically Signed   By: Abelardo Diesel M.D.   On: 12/18/2017 10:57  .  Marland Kitchen   Outpatient Encounter Medications as of 01/11/2018  Medication Sig  . acetaminophen-codeine (TYLENOL #3) 300-30 MG tablet Take 1 tablet by mouth every 6 (six) hours as needed for moderate pain. This medication contains acetaminophen. Do not take additional acetaminophen with this medication. (Patient not taking: Reported on 01/08/2018)  . ALPRAZolam (XANAX) 0.5 MG tablet TAKE ONE TABLET BY MOUTH ONCE DAILY AS NEEDED  . aspirin 81 MG tablet Take 81 mg by mouth daily.    . Calcium Carbonate (CALTRATE 600) 1500 MG TABS Take by mouth.    . cholecalciferol (VITAMIN D) 1000 UNITS tablet Take 1,000 Units by mouth daily.   . clobetasol cream (TEMOVATE) 0.05 %  Apply twice a week first week then once weekly as needed  . fesoterodine (TOVIAZ) 4 MG TB24 tablet Take 4 mg by mouth daily.  . fluticasone (CUTIVATE) 0.05 % cream Apply 1 application topically 2 (two) times daily.   . fluticasone (FLONASE) 50 MCG/ACT nasal spray Use 2 sprays in each nostril daily   . levothyroxine (SYNTHROID, LEVOTHROID) 75 MCG tablet Take 1 tablet (75 mcg total) by mouth daily.  Marland Kitchen losartan (COZAAR) 25 MG tablet Take 1 tablet (25 mg total) by mouth daily.  . meloxicam (MOBIC) 15 MG tablet Take 1 tablet (15 mg total) by mouth daily. (Patient not taking: Reported on 01/08/2018)  . metoprolol succinate (TOPROL-XL) 25 MG 24 hr tablet Take 1 tablet (25 mg total) by mouth daily.  . Multiple Vitamins-Minerals (MULTIVITAMIN WITH MINERALS) tablet Take 1 tablet by mouth daily.    . Omega-3 Fatty Acids (FISH OIL) 1000 MG CAPS Take 2 capsules by mouth 2 (two) times daily.  Marland Kitchen omeprazole (PRILOSEC) 40 MG capsule Take 1 capsule (40 mg total) by mouth daily.  . ondansetron (ZOFRAN ODT) 4 MG disintegrating tablet Take 1 tablet (4 mg total) by mouth every 8 (eight) hours as needed for nausea or vomiting.  . simvastatin (ZOCOR) 20 MG tablet Take 1 tablet (20 mg total) by mouth at bedtime.  . tretinoin (RETIN-A) 0.025 % cream Apply topically at bedtime.   . vitamin C (ASCORBIC ACID) 500 MG tablet Take 500 mg by mouth 2 (two) times daily.    No facility-administered encounter medications on file as of 01/11/2018.    Allergies  Allergen Reactions  . Tramadol Hcl     REACTION: jittery  . Tramadol Nausea And Vomiting    Past Medical History:  Diagnosis Date  . Allergy    SEASONAL  . Cataract    BILATERAL  . Fibromyalgia   . GERD (gastroesophageal reflux disease)   . H/O echocardiogram 04/28/08   EF>55% trace mitral regurgitation, No significant valvular pathology  . Hemorrhoids    internal  . History of stress test 02/08/2011   Normal Myocardial perfusion study, this is a low risk scan, No prior study available for comparison  . Hyperlipidemia   . MVP (mitral valve prolapse)    mild and MR  . OA (osteoarthritis)   . Osteoporosis   . Paroxysmal A-fib ([place])   . Sleep apnea   . Thyroid disease   . Varicosities of leg   . Vitamin D deficiency    Past Surgical History:   Procedure Laterality Date  . ABDOMINAL HYSTERECTOMY  1989  . APPENDECTOMY  1989   with hysterectomy  . BREAST EXCISIONAL BIOPSY Left 1995   Benign  . COLONOSCOPY  11-20-2000  . NOSE SURGERY  1990        Past Gynecological History:   GYNECOLOGIC HISTORY:  . No LMP recorded. Patient has had a hysterectomy. 1987 . Menarche: 73 years old . P 2 . Contraceptive none . HRT none  . Last Pap NA Family Hx:  Family History  Problem Relation Age of Onset  . Diabetes Daughter   . Diabetes Mother   . Hypertension Mother   . Heart disease Mother   . Stroke Maternal Grandmother   . Colon cancer Neg Hx    Social Hx:  Marland Kitchen Tobacco use: 1/2 pack "cigars" daily . Alcohol use: none . Illicit Drug use: none . Illicit IV Drug use: none    Review of Systems: Review of Systems  Constitutional: Positive for  appetite change.  Gastrointestinal: Positive for abdominal distention, abdominal pain, constipation, nausea and vomiting.  Genitourinary: Positive for bladder incontinence.   Musculoskeletal: Positive for back pain, gait problem and myalgias.  Neurological: Positive for gait problem.  All other systems reviewed and are negative. +early satiety  Vitals:  Vitals:   01/11/18 1438  BP: (!) 141/76  Pulse: 82  Resp: 18  Temp: 97.6 F (36.4 C)  SpO2: 99%   Vitals:   01/11/18 1438  Weight: 145 lb (65.8 kg)  Height: 5\' 2"  (1.575 m)   Body mass index is 26.52 kg/m.  Physical Exam: General :  Well developed, 73 y.o., female in no apparent distress;  HEENT:  Normocephalic/atraumatic, symmetric, EOMI, eyelids normal. minimal muscle wasting of face. Neck:   Supple, no masses.  Lymphatics:  No cervical/ submandibular/ supraclavicular/ infraclavicular/ inguinal adenopathy Respiratory:  Respirations unlabored, no use of accessory muscles CV:   Deferred Breast:  Deferred Musculoskeletal: No CVA tenderness, normal muscle strength. Abdomen:  Soft, non-tender and nondistended. No evidence  of hernia. No masses. Extremities:  No lymphedema, no erythema, non-tender. Skin:   Normal inspection Neuro/Psych:  No focal motor deficit, no abnormal mental status. Normal gait. Normal affect. Alert and oriented to person, place, and time  Genito Urinary: Vulva: Normal external female genitalia.  Bladder/urethra: Urethral meatus normal in size and location. No lesions or   masses, well supported bladder Cervix/Uterus: Surgically absent Bimanual exam:   Adnexal region: Palpable mass in culdesac firm and unable to mobilize out of pelvis. Seems to push somewhat posterior towards sacrum.  Rectovaginal:  Deferred by patient request due to pain/irritation  Assessment  Pelvic mass, carcinomatosis, elevated CA125, concerning for advanced ovarian cancer ECOG PERFORMANCE STATUS: 2 - Symptomatic, <50% confined to bed  Plan  Complexity of visit ? This is a new problem and additional workup is planned including biopsy ? Data reviewed ? I independently reviewed the images and the radiology reports and discussed my interpretation in the presence of the patient, her husband, daugter, and son-in-law ? Omental disease seen and there is concern for mesenteric involvement per radiology (Series 2 image [id number]) ? I reviewed her referring doctor's office notes and I have summarized in the HPI ? History was obtained from the patient and the chart ? We reviewed her elevated tumor marker and she understands the concern is ovarian cancer ? This is an acute illness that may pose a threat to life if left untreated ? Management will include consulting IR for biopsy and tentative plan for neoadjuvant chemo and interval debulking 1. Ovarian cancer  ? I explained my concern this is advanced ovarian cancer ? We discussed situations where we recommend chemotherapy prior to surgical resection ? Given the concern for mesenteric involvement on imaging, the extremely elevated CA125, and the suggestion of carcinomatosis, I  worry she would be suboptimally debulked ? We briefly reviewed prognosis/expectations 2. Management ? I think the next best step is to obtain tissue diagnosis with IR biopsy of the omentum and then referral to medical oncology for neoadjuvant chemotherapy. o I discussed tentative plan for chemotherapy and some more common side effects. o She will meet with Dr. Alvy Bimler and review these in greater detail  3. Further planning ? I would like to see her back ~3-4 weeks to review her patholgoy, to see how her first cycle of chemo went, to answer any further questions, to be sure things are on track, and to come up with a date for interval debulking.  4. We will be reaching out to her with biopsy date/time and then with (likely) time/date for appointment with Dr.Gorsuch. 5. Long-term plan should include genetics. 6. Pain medications to be used sparingly were prescribed  Face to face time with patient was >80 minutes. Over 50% of this time was spent on counseling and coordination of care.   Mart Piggs, MD Gynecologic Oncologist 01/11/2018, 2:47 PM    Cc: Donalynn Furlong, MD (Referring Ob/Gyn) Martinique, Terria G, MD  (PCP)

## 2018-01-11 NOTE — Patient Instructions (Addendum)
1. We will arrange a biopsy  2. Depending on the biopsy results the plan is to send you to the Medical Oncologist to start chemo 3. I will have you return to see me in 3-4 weeks to follow up 4. I will also order a chest CT

## 2018-01-11 NOTE — Telephone Encounter (Signed)
Left a message on patient's home and cell phones advising her that prescriptions for gabapentin and oxycodone have been sent to the [facility].  Also recommended that she have a family member with her when she takes gabapentin and to take the oxycodone sparingly as it can cause constipation.

## 2018-01-11 NOTE — Telephone Encounter (Signed)
Patient will be canceling all of her appointments due to her have cervical cancer and will be having surgery.  Earlie Counts, PT @1 /02/2018@ 8:25 AM '

## 2018-01-12 ENCOUNTER — Telehealth: Payer: Self-pay | Admitting: Oncology

## 2018-01-12 ENCOUNTER — Ambulatory Visit: Payer: PPO | Admitting: Physical Therapy

## 2018-01-12 DIAGNOSIS — C800 Disseminated malignant neoplasm, unspecified: Secondary | ICD-10-CM

## 2018-01-12 NOTE — Telephone Encounter (Signed)
Haley [name] and discussed appointment for medical oncology.  She said she would like her treatment in [place].  Appointment given for 01/19/18 with Dr. Alvy Bimler at 11:15 am.  She verbalized agreement and then said that the oxycodone and gabapentin are helping with her pain.

## 2018-01-15 ENCOUNTER — Other Ambulatory Visit: Payer: Self-pay | Admitting: Gynecologic Oncology

## 2018-01-15 ENCOUNTER — Encounter (HOSPITAL_COMMUNITY): Payer: Self-pay | Admitting: Emergency Medicine

## 2018-01-15 ENCOUNTER — Telehealth: Payer: Self-pay | Admitting: Oncology

## 2018-01-15 ENCOUNTER — Ambulatory Visit: Payer: Self-pay

## 2018-01-15 ENCOUNTER — Emergency Department (HOSPITAL_COMMUNITY): Payer: PPO

## 2018-01-15 ENCOUNTER — Ambulatory Visit (HOSPITAL_COMMUNITY)
Admission: RE | Admit: 2018-01-15 | Discharge: 2018-01-15 | Disposition: A | Discharge status: Home / Self Care | Payer: PPO | Source: Ambulatory Visit | Attending: Gynecologic Oncology | Admitting: Gynecologic Oncology

## 2018-01-15 ENCOUNTER — Telehealth: Payer: Self-pay

## 2018-01-15 ENCOUNTER — Inpatient Hospital Stay (HOSPITAL_COMMUNITY)
Admission: EM | Admit: 2018-01-15 | Discharge: 2018-02-02 | DRG: 981 | Disposition: A | Discharge status: Home Health | Payer: PPO | Attending: Internal Medicine | Admitting: Internal Medicine

## 2018-01-15 ENCOUNTER — Other Ambulatory Visit: Payer: Self-pay | Admitting: Radiology

## 2018-01-15 DIAGNOSIS — Z6826 Body mass index (BMI) 26.0-26.9, adult: Secondary | ICD-10-CM | POA: Diagnosis not present

## 2018-01-15 DIAGNOSIS — I480 Paroxysmal atrial fibrillation: Secondary | ICD-10-CM | POA: Diagnosis present

## 2018-01-15 DIAGNOSIS — Z885 Allergy status to narcotic agent status: Secondary | ICD-10-CM

## 2018-01-15 DIAGNOSIS — F17290 Nicotine dependence, other tobacco product, uncomplicated: Secondary | ICD-10-CM | POA: Diagnosis present

## 2018-01-15 DIAGNOSIS — R140 Abdominal distension (gaseous): Principal | ICD-10-CM

## 2018-01-15 DIAGNOSIS — C482 Malignant neoplasm of peritoneum, unspecified: Secondary | ICD-10-CM | POA: Diagnosis not present

## 2018-01-15 DIAGNOSIS — M7989 Other specified soft tissue disorders: Secondary | ICD-10-CM | POA: Diagnosis not present

## 2018-01-15 DIAGNOSIS — R05 Cough: Secondary | ICD-10-CM | POA: Diagnosis not present

## 2018-01-15 DIAGNOSIS — C569 Malignant neoplasm of unspecified ovary: Secondary | ICD-10-CM

## 2018-01-15 DIAGNOSIS — R109 Unspecified abdominal pain: Secondary | ICD-10-CM

## 2018-01-15 DIAGNOSIS — Z791 Long term (current) use of non-steroidal anti-inflammatories (NSAID): Secondary | ICD-10-CM

## 2018-01-15 DIAGNOSIS — M79604 Pain in right leg: Secondary | ICD-10-CM | POA: Diagnosis not present

## 2018-01-15 DIAGNOSIS — D61818 Other pancytopenia: Secondary | ICD-10-CM

## 2018-01-15 DIAGNOSIS — R1084 Generalized abdominal pain: Secondary | ICD-10-CM | POA: Diagnosis present

## 2018-01-15 DIAGNOSIS — Z79890 Hormone replacement therapy (postmenopausal): Secondary | ICD-10-CM

## 2018-01-15 DIAGNOSIS — Z833 Family history of diabetes mellitus: Secondary | ICD-10-CM

## 2018-01-15 DIAGNOSIS — R188 Other ascites: Secondary | ICD-10-CM | POA: Diagnosis not present

## 2018-01-15 DIAGNOSIS — Z0189 Encounter for other specified special examinations: Secondary | ICD-10-CM | POA: Diagnosis not present

## 2018-01-15 DIAGNOSIS — I10 Essential (primary) hypertension: Secondary | ICD-10-CM | POA: Diagnosis present

## 2018-01-15 DIAGNOSIS — R180 Malignant ascites: Secondary | ICD-10-CM | POA: Diagnosis present

## 2018-01-15 DIAGNOSIS — E876 Hypokalemia: Secondary | ICD-10-CM | POA: Diagnosis not present

## 2018-01-15 DIAGNOSIS — E43 Unspecified severe protein-calorie malnutrition: Secondary | ICD-10-CM | POA: Diagnosis present

## 2018-01-15 DIAGNOSIS — K668 Other specified disorders of peritoneum: Secondary | ICD-10-CM | POA: Diagnosis not present

## 2018-01-15 DIAGNOSIS — K56609 Unspecified intestinal obstruction, unspecified as to partial versus complete obstruction: Secondary | ICD-10-CM

## 2018-01-15 DIAGNOSIS — N839 Noninflammatory disorder of ovary, fallopian tube and broad ligament, unspecified: Secondary | ICD-10-CM | POA: Diagnosis not present

## 2018-01-15 DIAGNOSIS — K56600 Partial intestinal obstruction, unspecified as to cause: Secondary | ICD-10-CM | POA: Diagnosis present

## 2018-01-15 DIAGNOSIS — Z7951 Long term (current) use of inhaled steroids: Secondary | ICD-10-CM

## 2018-01-15 DIAGNOSIS — E785 Hyperlipidemia, unspecified: Secondary | ICD-10-CM | POA: Diagnosis present

## 2018-01-15 DIAGNOSIS — E039 Hypothyroidism, unspecified: Secondary | ICD-10-CM | POA: Diagnosis present

## 2018-01-15 DIAGNOSIS — R971 Elevated cancer antigen 125 [CA 125]: Secondary | ICD-10-CM | POA: Diagnosis present

## 2018-01-15 DIAGNOSIS — Z79891 Long term (current) use of opiate analgesic: Secondary | ICD-10-CM

## 2018-01-15 DIAGNOSIS — K652 Spontaneous bacterial peritonitis: Secondary | ICD-10-CM | POA: Diagnosis present

## 2018-01-15 DIAGNOSIS — K219 Gastro-esophageal reflux disease without esophagitis: Secondary | ICD-10-CM | POA: Diagnosis present

## 2018-01-15 DIAGNOSIS — D638 Anemia in other chronic diseases classified elsewhere: Secondary | ICD-10-CM | POA: Diagnosis not present

## 2018-01-15 DIAGNOSIS — M79605 Pain in left leg: Secondary | ICD-10-CM | POA: Diagnosis not present

## 2018-01-15 DIAGNOSIS — C561 Malignant neoplasm of right ovary: Secondary | ICD-10-CM

## 2018-01-15 DIAGNOSIS — I361 Nonrheumatic tricuspid (valve) insufficiency: Secondary | ICD-10-CM | POA: Diagnosis not present

## 2018-01-15 DIAGNOSIS — Z79899 Other long term (current) drug therapy: Secondary | ICD-10-CM

## 2018-01-15 DIAGNOSIS — C800 Disseminated malignant neoplasm, unspecified: Secondary | ICD-10-CM | POA: Diagnosis present

## 2018-01-15 DIAGNOSIS — G8929 Other chronic pain: Secondary | ICD-10-CM | POA: Diagnosis present

## 2018-01-15 DIAGNOSIS — D701 Agranulocytosis secondary to cancer chemotherapy: Secondary | ICD-10-CM | POA: Diagnosis present

## 2018-01-15 DIAGNOSIS — R059 Cough, unspecified: Secondary | ICD-10-CM

## 2018-01-15 DIAGNOSIS — Z8249 Family history of ischemic heart disease and other diseases of the circulatory system: Secondary | ICD-10-CM

## 2018-01-15 DIAGNOSIS — I700 Atherosclerosis of aorta: Secondary | ICD-10-CM | POA: Diagnosis present

## 2018-01-15 DIAGNOSIS — Z452 Encounter for adjustment and management of vascular access device: Secondary | ICD-10-CM | POA: Diagnosis not present

## 2018-01-15 DIAGNOSIS — C786 Secondary malignant neoplasm of retroperitoneum and peritoneum: Secondary | ICD-10-CM | POA: Diagnosis present

## 2018-01-15 DIAGNOSIS — Z90710 Acquired absence of both cervix and uterus: Secondary | ICD-10-CM

## 2018-01-15 DIAGNOSIS — R52 Pain, unspecified: Secondary | ICD-10-CM | POA: Diagnosis not present

## 2018-01-15 DIAGNOSIS — M797 Fibromyalgia: Secondary | ICD-10-CM | POA: Diagnosis present

## 2018-01-15 DIAGNOSIS — Z4682 Encounter for fitting and adjustment of non-vascular catheter: Secondary | ICD-10-CM | POA: Diagnosis not present

## 2018-01-15 DIAGNOSIS — Z823 Family history of stroke: Secondary | ICD-10-CM

## 2018-01-15 DIAGNOSIS — Z7982 Long term (current) use of aspirin: Secondary | ICD-10-CM

## 2018-01-15 LAB — CBC
HEMATOCRIT: 35.5 % — AB (ref 36.0–46.0)
Hemoglobin: 11.5 g/dL — ABNORMAL LOW (ref 12.0–15.0)
MCH: 28.8 pg (ref 26.0–34.0)
MCHC: 32.4 g/dL (ref 30.0–36.0)
MCV: 89 fL (ref 80.0–100.0)
Platelets: 387 10*3/uL (ref 150–400)
RBC: 3.99 MIL/uL (ref 3.87–5.11)
RDW: 13.2 % (ref 11.5–15.5)
WBC: 9 10*3/uL (ref 4.0–10.5)
nRBC: 0 % (ref 0.0–0.2)

## 2018-01-15 LAB — GRAM STAIN

## 2018-01-15 LAB — URINALYSIS, ROUTINE W REFLEX MICROSCOPIC
BACTERIA UA: NONE SEEN
Bilirubin Urine: NEGATIVE
GLUCOSE, UA: NEGATIVE mg/dL
Ketones, ur: NEGATIVE mg/dL
Leukocytes, UA: NEGATIVE
NITRITE: NEGATIVE
Protein, ur: NEGATIVE mg/dL
Specific Gravity, Urine: 1.002 — ABNORMAL LOW (ref 1.005–1.030)
pH: 6 (ref 5.0–8.0)

## 2018-01-15 LAB — COMPREHENSIVE METABOLIC PANEL
ALT: 56 U/L — ABNORMAL HIGH (ref 0–44)
AST: 40 U/L (ref 15–41)
Albumin: 2.8 g/dL — ABNORMAL LOW (ref 3.5–5.0)
Alkaline Phosphatase: 208 U/L — ABNORMAL HIGH (ref 38–126)
Anion gap: 10 (ref 5–15)
BUN: 10 mg/dL (ref 8–23)
CHLORIDE: 100 mmol/L (ref 98–111)
CO2: 24 mmol/L (ref 22–32)
Calcium: 8.4 mg/dL — ABNORMAL LOW (ref 8.9–10.3)
Creatinine, Ser: 0.54 mg/dL (ref 0.44–1.00)
GFR calc Af Amer: >60 mL/min (ref >60)
Glucose, Bld: 106 mg/dL — ABNORMAL HIGH (ref 70–99)
Potassium: 3.4 mmol/L — ABNORMAL LOW (ref 3.5–5.1)
Sodium: 134 mmol/L — ABNORMAL LOW (ref 135–145)
Total Bilirubin: 0.3 mg/dL (ref 0.3–1.2)
Total Protein: 6.4 g/dL — ABNORMAL LOW (ref 6.5–8.1)

## 2018-01-15 LAB — BODY FLUID CELL COUNT WITH DIFFERENTIAL
Lymphs, Fluid: 16 %
Monocyte-Macrophage-Serous Fluid: 58 % (ref 50–90)
Neutrophil Count, Fluid: 26 % — ABNORMAL HIGH (ref 0–25)
Total Nucleated Cell Count, Fluid: 1718 cu mm — ABNORMAL HIGH (ref 0–1000)

## 2018-01-15 LAB — I-STAT CG4 LACTIC ACID, ED: LACTIC ACID, VENOUS: 0.61 mmol/L (ref 0.5–1.9)

## 2018-01-15 LAB — LIPASE, BLOOD: Lipase: 30 U/L (ref 11–51)

## 2018-01-15 MED ORDER — HYDROMORPHONE HCL 1 MG/ML IJ SOLN
0.5000 mg | Freq: Once | INTRAMUSCULAR | Status: AC
  Administered 2018-01-15: 0.5 mg via INTRAVENOUS
  Filled 2018-01-15: qty 1

## 2018-01-15 MED ORDER — SODIUM CHLORIDE 0.9 % IV SOLN: INTRAVENOUS | Status: DC

## 2018-01-15 MED ORDER — IOPAMIDOL (ISOVUE-300) INJECTION 61%
INTRAVENOUS | Status: AC
  Filled 2018-01-15: qty 100

## 2018-01-15 MED ORDER — SODIUM CHLORIDE (PF) 0.9 % IJ SOLN
INTRAMUSCULAR | Status: AC
  Filled 2018-01-15: qty 50

## 2018-01-15 MED ORDER — SODIUM CHLORIDE 0.9 % IV SOLN
2.0000 g | Freq: Once | INTRAVENOUS | Status: AC
  Administered 2018-01-15: 2 g via INTRAVENOUS
  Filled 2018-01-15: qty 2

## 2018-01-15 MED ORDER — LIDOCAINE HCL 1 % IJ SOLN
INTRAMUSCULAR | Status: AC
  Filled 2018-01-15: qty 10

## 2018-01-15 MED ORDER — IOPAMIDOL (ISOVUE-300) INJECTION 61%
100.0000 mL | Freq: Once | INTRAVENOUS | Status: AC | PRN
  Administered 2018-01-15: 100 mL via INTRAVENOUS

## 2018-01-15 NOTE — Telephone Encounter (Signed)
Left a message for Haley [name] to discuss gabapentin.  Requested a return call.

## 2018-01-15 NOTE — Telephone Encounter (Signed)
C/o constipation x 3 days; took Miralax and colace last night.  Have been in severe pain since 12/28.   C/o abdominal bloating across entire abdomen.  Reported she was advised to take Oxycodone for the pain, but is afraid to take it, since she has been so constipated.  Reported her pain was severe last night, and did take Oxycodone.  Verb. She is afraid to take the Oxycodone due to the constipation.  C/o being very uncomfortable with the significant abdominal bloating.  Stated "I'm swollen from my chest to my crotch"   Requesting to be put in the hospital.    [place]; spoke with nurse re: severity of pt's symptoms.  Nurse, Louise connected to pt. For further discussion and recommendation.          Reason for Disposition . [1] SEVERE pain AND [2] age > 20  Answer Assessment - Initial Assessment Questions 1. LOCATION: "Where does it hurt?"      All across abdomen  2. RADIATION: "Does the pain shoot anywhere else?" (e.g., chest, back)     No  3. ONSET: "When did the pain begin?" (e.g., minutes, hours or days ago)      Since going to ER on 12/28 4. SUDDEN: "Gradual or sudden onset?"     Sudden onset 01/06/18 5. PATTERN "Does the pain come and go, or is it constant?"    - If constant: "Is it getting better, staying the same, or worsening?"      (Note: Constant means the pain never goes away completely; most serious pain is constant and it progresses)     - If intermittent: "How long does it last?" "Do you have pain now?"     (Note: Intermittent means the pain goes away completely between bouts)     constant 6. SEVERITY: "How bad is the pain?"  (e.g., Scale 1-10; mild, moderate, or severe)   - MILD (1-3): doesn't interfere with normal activities, abdomen soft and not tender to touch    - MODERATE (4-7): interferes with normal activities or awakens from sleep, tender to touch    - SEVERE (8-10): excruciating pain, doubled over, unable to do any normal activities     Pain has eased  ; feeling very full, bloated; unable to eat; drinking small amts of water 7. RECURRENT SYMPTOM: "Have you ever had this type of abdominal pain before?" If so, ask: "When was the last time?" and "What happened that time?"      Since 01/06/18 8. CAUSE: "What do you think is causing the abdominal pain?"     Understood from GYN oncologist that the swelling is due to Ovarian Cancer 9. RELIEVING/AGGRAVATING FACTORS: "What makes it better or worse?" (e.g., movement, antacids, bowel movement)    Taking colace and miralax daily x 3 ; taking a fiber supplement 10. OTHER SYMPTOMS: "Has there been any vomiting, diarrhea, constipation, or urine problems?"       Last stool today-small, very soft, mushy.  Denied nausea at present.  Feels urine output is decreased somewhat  11. PREGNANCY: "Is there any chance you are pregnant?" "When was your last menstrual period?"       N/a  Protocols used: ABDOMINAL PAIN - [facility] Message from Rayann Heman sent at 01/15/2018 10:03 AM EST   Summary: stomach swollen 1 week    Pt called and stated that he stomach is swollen and painful. Pt states that she went to ed for issue on 01/13/18. Pt would like  to know what she needs to do. Pt states that she is constipated. Please advise

## 2018-01-15 NOTE — Procedures (Addendum)
Ultrasound-guided diagnostic and therapeutic paracentesis performed yielding 1.5 liters of hazy, yellow fluid. No immediate complications. A portion of the fluid was sent to the lab for preordered studies. EBL< 1cc.

## 2018-01-15 NOTE — Telephone Encounter (Signed)
Message sent to Dr. Jordan for review. 

## 2018-01-15 NOTE — Progress Notes (Signed)
See RN note.

## 2018-01-15 NOTE — Progress Notes (Signed)
A consult was received from an ED provider for Cefepime per pharmacy dosing.  The patient's profile has been reviewed for ht/wt/allergies/indication/available labs.    A one time order has been placed for Cefepime 2g IV.  Further antibiotics/pharmacy consults should be ordered by admitting physician if indicated.                       Thank you, Luiz Ochoa 01/15/2018  4:58 PM

## 2018-01-15 NOTE — Telephone Encounter (Signed)
Haley [name] left a message yesterday asking for a refill of oxycodone and gabapentin. She said she needs them as soon as possible.

## 2018-01-15 NOTE — Telephone Encounter (Signed)
Spoke with Ms Wolfley.  She had a small BM this am.  Using Miralax daily with stool softener.  Told her to increase the Miralax to Bid. She has gotten some relief from ibuprofen 600 mg last evening.   No vomiting but has nausea and early satiety. Told Ms [facility] that her symptoms were reviewed with [name], NP. Will obtain an Abdominal x-ray and abdominal US with possible paracentesis.  She needs to go to [facility] at 1 pm for X-ray followed by [place] at 2 pm. Told her to have husband get some ice cream /yougart.  She needs to eat one of these and take her OxyIR 5 mg tab, 600 mg of Ibuprofen and her Prilosec prior to coming to the hospital. Pt verbalized understanding.

## 2018-01-15 NOTE — ED Triage Notes (Addendum)
Pt had abd xray done today due to her abd distention and no BM. Xray showed SBO or ileus. Pt was advised to go to ED.

## 2018-01-15 NOTE — Telephone Encounter (Signed)
Spoke with Ms [facility] post paracentesis.  She is still very uncomfortable. Her abdomen is cramping.  She did take the pain med and ibuprofen as noted below. If she is still very uncomfortable, she could go to the ED for further evaluation.   Pt verbalized understanding.

## 2018-01-15 NOTE — ED Provider Notes (Signed)
[facility] DEPT Provider Note   CSN: [id number] Arrival date & time: 01/15/18  1504     History   Chief Complaint Chief Complaint  Patient presents with  . Abdominal Pain  . Constipation    HPI Haley [name] is a 73 y.o. female with past medical history of GERD, paroxysmal A. fib, hyperlipidemia who presents for evaluation of persistent abdominal pain, constipation, abdominal distention.  She was seen in the ED on 01/06/18 at [facility] for evaluation of similar symptoms.  At that time her CT scan showed concerns for ovarian malignancy with peritoneal carcinomatosis.  She was offered admission but declined and said she would follow-up with her OB/GYN doctor.  She had appointment with GYN on-call on 01/11/18.  At that time, they had planned to obtain biopsy as well as CT chest for further evaluation and plan to start chemo.  Patient reports she had not started any treatment.  Patient reports that her pain had improved slightly but states that she noticed yesterday, the pain was returning.  She describes it as a tightness, cramping pain all over her entire abdomen.  Also reports constipation.  She states that she has not had a normal bowel movement in the last 3 days.  She states she had a small bowel movement earlier today that was small drops. Additionally, patient states that she had a paracentesis done today as well as an x-ray and was told to come to the ED for further evaluation.  Patient does report some mild improvement in pain after paracentesis was done.  Patient reports that she has not been vomiting but she has not been able to tolerate any p.o.  Patient states she feels pressure down her lower abdomen radiating up but denies any chest pain or difficulty breathing.  Patient denies any fevers.  The history is provided by the patient.    Past Medical History:  Diagnosis Date  . Allergy    SEASONAL  . Cataract    BILATERAL  . Fibromyalgia   . GERD  (gastroesophageal reflux disease)   . H/O echocardiogram 04/28/08   EF>55% trace mitral regurgitation, No significant valvular pathology  . Hemorrhoids    internal  . History of stress test 02/08/2011   Normal Myocardial perfusion study, this is a low risk scan, No prior study available for comparison  . Hyperlipidemia   . MVP (mitral valve prolapse)    mild and MR  . OA (osteoarthritis)   . Osteoporosis   . Paroxysmal A-fib ([place])   . Sleep apnea    does not use prescribed CPAP  . Thyroid disease    HYPO  . Varicosities of leg   . Vitamin D deficiency     Patient Active Problem List   Diagnosis Date Noted  . Abdominal pain 01/15/2018  . Carcinomatosis ([place]) 01/11/2018  . Essential hypertension, benign 09/13/2017  . Osteoporosis 01/13/2014  . Urgency incontinence 01/13/2014  . PAF (paroxysmal atrial fibrillation) ([place]) 03/31/2013  . Tobacco use disorder 08/06/2012  . Dyslipidemia 06/05/2012  . Anxiety 06/05/2012  . GERD 06/03/2008  . Hypothyroidism 07/07/2006    Past Surgical History:  Procedure Laterality Date  . ABDOMINAL HYSTERECTOMY  1989  . APPENDECTOMY  1989   with hysterectomy  . BREAST EXCISIONAL BIOPSY Left 1995   Benign  . COLONOSCOPY  11-20-2000  . NOSE SURGERY  1990     OB History    Gravida  3   Para  3   Term  Preterm      AB      Living  2     SAB      TAB      Ectopic      Multiple      Live Births               Home Medications    Prior to Admission medications   Medication Sig Start Date End Date Taking? Authorizing Provider  acetaminophen-codeine (TYLENOL #3) 300-30 MG tablet Take 1 tablet by mouth every 6 (six) hours as needed for moderate pain. This medication contains acetaminophen. Do not take additional acetaminophen with this medication. 01/06/18  Yes Rancour, Annie Main, MD  ALPRAZolam Duanne Moron) 0.5 MG tablet TAKE ONE TABLET BY MOUTH ONCE DAILY AS NEEDED Patient taking differently: Take 0.25-0.5 mg by mouth  daily as needed for anxiety. TAKE ONE TABLET BY MOUTH ONCE DAILY AS NEEDED 05/10/17  Yes Marletta Lor, MD  aspirin 81 MG tablet Take 81 mg by mouth daily.     Yes [provider]  Calcium Carbonate (CALTRATE 600) 1500 MG TABS Take by mouth.     Yes [provider]  cholecalciferol (VITAMIN D) 1000 UNITS tablet Take 1,000 Units by mouth daily.    Yes [provider]  fluticasone (CUTIVATE) 0.05 % cream Apply 1 application topically at bedtime.  08/03/12  Yes [provider]  fluticasone Asencion Islam) 50 MCG/ACT nasal spray Use 2 sprays in each nostril daily 05/11/17  Yes Marletta Lor, MD  gabapentin (NEURONTIN) 100 MG capsule Take 1 capsule (100 mg total) by mouth daily as needed (Pain). 01/11/18  Yes Precious Haws B, MD  ibuprofen (ADVIL,MOTRIN) 200 MG tablet Take 600 mg by mouth daily as needed for moderate pain.   Yes [provider]  levothyroxine (SYNTHROID, LEVOTHROID) 75 MCG tablet Take 1 tablet (75 mcg total) by mouth daily. 11/05/17  Yes [place]ique, Ezrie G, MD  losartan (COZAAR) 25 MG tablet Take 1 tablet (25 mg total) by mouth daily. 06/09/17 01/15/18 Yes Troy Sine, MD  Menthol-Methyl Salicylate (MUSCLE RUB) 10-15 % CREA Apply 1 application topically as needed for muscle pain.   Yes [provider]  metoprolol succinate (TOPROL-XL) 25 MG 24 hr tablet Take 1 tablet (25 mg total) by mouth daily. 09/17/17  Yes [place], Brynda G, MD  Multiple Vitamins-Minerals (MULTIVITAMIN WITH MINERALS) tablet Take 1 tablet by mouth daily.     Yes [provider]  Omega-3 Fatty Acids (FISH OIL) 1000 MG CAPS Take 2 capsules by mouth 2 (two) times daily.   Yes [provider]  omeprazole (PRILOSEC) 40 MG capsule Take 1 capsule (40 mg total) by mouth daily. 09/26/17  Yes [place], Shaquana G, MD  oxyCODONE (OXY IR/ROXICODONE) 5 MG immediate release tablet Take 1 tablet (5 mg total) by mouth every 4 (four) hours as needed for severe pain. 01/11/18   Yes Precious Haws B, MD  simvastatin (ZOCOR) 20 MG tablet Take 1 tablet (20 mg total) by mouth at bedtime. 05/11/17  Yes Troy Sine, MD  vitamin C (ASCORBIC ACID) 500 MG tablet Take 500 mg by mouth 2 (two) times daily.    Yes [provider]  clobetasol cream (TEMOVATE) 0.05 % Apply twice a week first week then once weekly as needed Patient not taking: Reported on 01/15/2018 06/03/14   Terrance Mass, MD  meloxicam (MOBIC) 15 MG tablet Take 1 tablet (15 mg total) by mouth daily. Patient not taking: Reported on  01/08/2018 12/21/16   Marletta Lor, MD  ondansetron (ZOFRAN ODT) 4 MG disintegrating tablet Take 1 tablet (4 mg total) by mouth every 8 (eight) hours as needed for nausea or vomiting. 01/06/18   Ezequiel Essex, MD    Family History Family History  Problem Relation Age of Onset  . Diabetes Daughter   . Diabetes Mother   . Hypertension Mother   . Heart disease Mother   . Stroke Maternal Grandmother   . Cancer Niece        unsure breast or lung  . Colon cancer Neg Hx     Social History Social History   Tobacco Use  . Smoking status: Current Every Day Smoker    Packs/day: 0.50    Years: 10.00    Pack years: 5.00    Types: Cigars  . Smokeless tobacco: Never Used  . Tobacco comment: peach flavor   Substance Use Topics  . Alcohol use: No    Alcohol/week: 0.0 standard drinks  . Drug use: No     Allergies   Tramadol hcl and Tramadol   Review of Systems Review of Systems  Constitutional: Positive for appetite change. Negative for fever.  Respiratory: Negative for cough and shortness of breath.   Cardiovascular: Negative for chest pain.  Gastrointestinal: Positive for abdominal distention, abdominal pain and constipation. Negative for nausea and vomiting.  Genitourinary: Negative for dysuria and hematuria.  Neurological: Negative for headaches.  All other systems reviewed and are negative.    Physical Exam Updated Vital Signs BP 114/63    Pulse 63   Temp 98.3 F (36.8 C) (Oral)   Resp 16   SpO2 94%   Physical Exam Vitals signs and nursing note reviewed.  Constitutional:      Appearance: Normal appearance. She is well-developed.     Comments: Appears uncomfortable  HENT:     Head: Normocephalic and atraumatic.  Eyes:     General: Lids are normal.     Conjunctiva/sclera: Conjunctivae normal.     Pupils: Pupils are equal, round, and reactive to light.  Neck:     Musculoskeletal: Full passive range of motion without pain.  Cardiovascular:     Rate and Rhythm: Normal rate and regular rhythm.     Pulses: Normal pulses.     Heart sounds: Normal heart sounds. No murmur. No friction rub. No gallop.   Pulmonary:     Effort: Pulmonary effort is normal.     Breath sounds: Normal breath sounds.     Comments: Lungs clear to auscultation bilaterally.  Symmetric chest rise.  No wheezing, rales, rhonchi. Abdominal:     General: There is distension.     Palpations: Abdomen is soft. Abdomen is not rigid.     Tenderness: There is abdominal tenderness. There is no right CVA tenderness, left CVA tenderness or guarding.       Comments: Abdomen is distended.  Diffuse tenderness palpation no focal point.  No CVA tenderness bilaterally.  Musculoskeletal: Normal range of motion.  Skin:    General: Skin is warm and dry.     Capillary Refill: Capillary refill takes less than 2 seconds.  Neurological:     Mental Status: She is alert and oriented to person, place, and time.  Psychiatric:        Speech: Speech normal.      ED Treatments / Results  Labs (all labs ordered are listed, but only abnormal results are displayed) Labs Reviewed  COMPREHENSIVE METABOLIC PANEL - Abnormal;  Notable for the following components:      Result Value   Sodium 134 (*)    Potassium 3.4 (*)    Glucose, Bld 106 (*)    Calcium 8.4 (*)    Total Protein 6.4 (*)    Albumin 2.8 (*)    ALT 56 (*)    Alkaline Phosphatase 208 (*)    All other  components within normal limits  CBC - Abnormal; Notable for the following components:   Hemoglobin 11.5 (*)    HCT 35.5 (*)    All other components within normal limits  URINALYSIS, ROUTINE W REFLEX MICROSCOPIC - Abnormal; Notable for the following components:   Color, Urine STRAW (*)    Specific Gravity, Urine 1.002 (*)    Hgb urine dipstick SMALL (*)    All other components within normal limits  GRAM STAIN  CULTURE, BLOOD (ROUTINE X 2)  CULTURE, BLOOD (ROUTINE X 2)  CULTURE, BODY FLUID-BOTTLE  LIPASE, BLOOD  I-STAT CG4 LACTIC ACID, ED    EKG None  Radiology Ct Chest W Contrast  Result Date: 01/15/2018 CLINICAL DATA:  Abdominal distention with no bowel movements. Radiographs demonstrated small-bowel obstruction or ileus. History of hysterectomy and appendectomy. EXAM: CT CHEST, ABDOMEN, AND PELVIS WITH CONTRAST TECHNIQUE: Multidetector CT imaging of the chest, abdomen and pelvis was performed following the standard protocol during bolus administration of intravenous contrast. CONTRAST:  178m ISOVUE-300 IOPAMIDOL (ISOVUE-300) INJECTION 61% COMPARISON:  Radiographs of the abdomen from 01/15/2018, CT abdomen pelvis 01/06/2018 FINDINGS: CT CHEST FINDINGS Cardiovascular: Conventional branch pattern of the great vessels with mild calcific atherosclerosis. No aneurysm or dissection. The thoracic aorta is nonaneurysmal demonstrates mild atherosclerosis at the arch and along the descending portion. Heart size is top normal without pericardial effusion or thickening. No significant coronary arteriosclerosis. No large central pulmonary embolus. Mediastinum/Nodes: No enlarged mediastinal, hilar, or axillary lymph nodes. The thyroid gland is excluded on this study. Patent trachea and mainstem bronchi. Unremarkable CT appearance of the thoracic esophagus. Lungs/Pleura: Linear atelectasis and/or scarring is noted in the left lower lobe. No acute pulmonary consolidation or dominant mass. No pneumothorax is  noted. Musculoskeletal: No chest wall mass or suspicious bone lesions identified. CT ABDOMEN PELVIS FINDINGS Hepatobiliary: Homogeneous enhancement of the liver. Small left hepatic lobe cyst measuring approximately 1 cm, series 2/42. The gallbladder is distended without stones possibly from a fasting state. No mural thickening or pericholecystic fluid. Small amount perihepatic ascites is identified. Slight intrahepatic ductal dilatation may be due to fullness of the gallbladder. Pancreas: Coarse calcification noted in the body of the pancreas. No acute inflammatory process, mass or ductal dilatation. Spleen: Normal size spleen without mass. Adrenals/Urinary Tract: Normal bilateral adrenal glands and symmetric cortical enhancement of both kidneys. Delayed imaging through both kidneys demonstrate symmetric pyelograms without obstructive uropathy. The urinary bladder is physiologically distended and unremarkable for the degree of distention. Stomach/Bowel: Small hiatal hernia is identified. The stomach is nondistended. The duodenal sweep and ligament of Treitz is unremarkable. Fluid-filled distended jejunal loops to 4.1 cm are identified with transition point suggested in the right lower quadrant, series 2, image 98. Additionally there is a short segmental area of luminal narrowing with mural thickening possibly representing small stricture or involvement by neoplasm is not excluded, series 2/89 measuring up to 1.7 cm in length with single wall thickness of up to 6 mm. The transition point appears to be adjacent to the right ovarian complex cystic mass and may be adherent as previously suggested on prior report.  The patient is status post appendectomy. The colon is relatively decompressed in appearance. Vascular/Lymphatic: Patent portal and splenic veins. Moderate aortic iliac atherosclerosis without aneurysm. Lymphadenopathy identified by CT size criteria. Reproductive: Complex cystic mass with septations and possible  soft tissue components along the caudal aspect is identified in the right adnexa measuring 4.9 x 4 x 4.7 cm (AP by transverse by craniocaudad). Findings are concerning for possible ovarian neoplasm. Further correlation with MRI is recommended. A more simple appearing left adnexal cyst measuring 5.3 x 3.8 x 4.5 cm is identified. The patient is status post hysterectomy. Other: Small volume of ascites is noted in the upper abdomen. There appears to be rind like thickening of the peritoneum concerning for peritoneal caking, series 2/61 in the left upper quadrant in particular concerning for peritoneal carcinomatosis. Musculoskeletal: No acute or significant osseous findings. IMPRESSION: Chest CT: 1. No active cardiopulmonary disease. 2. Aortic atherosclerosis without aneurysm or dissection. 3. No large central pulmonary embolus. CT AP: 1. Dilated fluid-filled loops of small bowel are redemonstrated slightly more extensive than on prior exam with transition point likely in the right adnexa adjacent to a complex cystic mass concerning for ovarian neoplasm given septations and soft tissue nodularity. This raises concern for early or partial SBO. This soft tissue mass measures 4.9 x 4 x 4.7 cm and has not changed since prior recent comparison. Additional short segmental area of luminal narrowing is noted in the right lower quadrant involving small bowel for which stigmata of peritoneal carcinomatosis or small-bowel metastatic implants might account for this. 2. Redemonstration of small volume of ascites predominantly in the upper abdomen surrounding the liver and spleen. 3. Redemonstration of thick bandlike omental thickening concerning for peritoneal carcinomatosis. Electronically Signed   By: Ashley Royalty M.D.   On: 01/15/2018 19:22   Ct Abdomen Pelvis W Contrast  Result Date: 01/15/2018 CLINICAL DATA:  Abdominal distention with no bowel movements. Radiographs demonstrated small-bowel obstruction or ileus. History of  hysterectomy and appendectomy. EXAM: CT CHEST, ABDOMEN, AND PELVIS WITH CONTRAST TECHNIQUE: Multidetector CT imaging of the chest, abdomen and pelvis was performed following the standard protocol during bolus administration of intravenous contrast. CONTRAST:  168m ISOVUE-300 IOPAMIDOL (ISOVUE-300) INJECTION 61% COMPARISON:  Radiographs of the abdomen from 01/15/2018, CT abdomen pelvis 01/06/2018 FINDINGS: CT CHEST FINDINGS Cardiovascular: Conventional branch pattern of the great vessels with mild calcific atherosclerosis. No aneurysm or dissection. The thoracic aorta is nonaneurysmal demonstrates mild atherosclerosis at the arch and along the descending portion. Heart size is top normal without pericardial effusion or thickening. No significant coronary arteriosclerosis. No large central pulmonary embolus. Mediastinum/Nodes: No enlarged mediastinal, hilar, or axillary lymph nodes. The thyroid gland is excluded on this study. Patent trachea and mainstem bronchi. Unremarkable CT appearance of the thoracic esophagus. Lungs/Pleura: Linear atelectasis and/or scarring is noted in the left lower lobe. No acute pulmonary consolidation or dominant mass. No pneumothorax is noted. Musculoskeletal: No chest wall mass or suspicious bone lesions identified. CT ABDOMEN PELVIS FINDINGS Hepatobiliary: Homogeneous enhancement of the liver. Small left hepatic lobe cyst measuring approximately 1 cm, series 2/42. The gallbladder is distended without stones possibly from a fasting state. No mural thickening or pericholecystic fluid. Small amount perihepatic ascites is identified. Slight intrahepatic ductal dilatation may be due to fullness of the gallbladder. Pancreas: Coarse calcification noted in the body of the pancreas. No acute inflammatory process, mass or ductal dilatation. Spleen: Normal size spleen without mass. Adrenals/Urinary Tract: Normal bilateral adrenal glands and symmetric cortical enhancement of both kidneys.  Delayed  imaging through both kidneys demonstrate symmetric pyelograms without obstructive uropathy. The urinary bladder is physiologically distended and unremarkable for the degree of distention. Stomach/Bowel: Small hiatal hernia is identified. The stomach is nondistended. The duodenal sweep and ligament of Treitz is unremarkable. Fluid-filled distended jejunal loops to 4.1 cm are identified with transition point suggested in the right lower quadrant, series 2, image 98. Additionally there is a short segmental area of luminal narrowing with mural thickening possibly representing small stricture or involvement by neoplasm is not excluded, series 2/89 measuring up to 1.7 cm in length with single wall thickness of up to 6 mm. The transition point appears to be adjacent to the right ovarian complex cystic mass and may be adherent as previously suggested on prior report. The patient is status post appendectomy. The colon is relatively decompressed in appearance. Vascular/Lymphatic: Patent portal and splenic veins. Moderate aortic iliac atherosclerosis without aneurysm. Lymphadenopathy identified by CT size criteria. Reproductive: Complex cystic mass with septations and possible soft tissue components along the caudal aspect is identified in the right adnexa measuring 4.9 x 4 x 4.7 cm (AP by transverse by craniocaudad). Findings are concerning for possible ovarian neoplasm. Further correlation with MRI is recommended. A more simple appearing left adnexal cyst measuring 5.3 x 3.8 x 4.5 cm is identified. The patient is status post hysterectomy. Other: Small volume of ascites is noted in the upper abdomen. There appears to be rind like thickening of the peritoneum concerning for peritoneal caking, series 2/61 in the left upper quadrant in particular concerning for peritoneal carcinomatosis. Musculoskeletal: No acute or significant osseous findings. IMPRESSION: Chest CT: 1. No active cardiopulmonary disease. 2. Aortic  atherosclerosis without aneurysm or dissection. 3. No large central pulmonary embolus. CT AP: 1. Dilated fluid-filled loops of small bowel are redemonstrated slightly more extensive than on prior exam with transition point likely in the right adnexa adjacent to a complex cystic mass concerning for ovarian neoplasm given septations and soft tissue nodularity. This raises concern for early or partial SBO. This soft tissue mass measures 4.9 x 4 x 4.7 cm and has not changed since prior recent comparison. Additional short segmental area of luminal narrowing is noted in the right lower quadrant involving small bowel for which stigmata of peritoneal carcinomatosis or small-bowel metastatic implants might account for this. 2. Redemonstration of small volume of ascites predominantly in the upper abdomen surrounding the liver and spleen. 3. Redemonstration of thick bandlike omental thickening concerning for peritoneal carcinomatosis. Electronically Signed   By: Ashley Royalty M.D.   On: 01/15/2018 19:22   US Paracentesis  Result Date: 01/15/2018 INDICATION: Patient with history of abdominal pain, complex right adnexal cystic mass, elevated CA-125, omental nodularity, ascites. Request made for diagnostic and therapeutic paracentesis. EXAM: ULTRASOUND GUIDED DIAGNOSTIC AND THERAPEUTIC PARACENTESIS MEDICATIONS: None COMPLICATIONS: None immediate. PROCEDURE: Informed written consent was obtained from the patient after a discussion of the risks, benefits and alternatives to treatment. A timeout was performed prior to the initiation of the procedure. Initial ultrasound scanning demonstrates a small amount of ascites within the right lower abdominal quadrant. The right lower abdomen was prepped and draped in the usual sterile fashion. 1% lidocaine was used for local anesthesia. Following this, a 19 gauge, 10-cm, Yueh catheter was introduced. An ultrasound image was saved for documentation purposes. The paracentesis was performed.  The catheter was removed and a dressing was applied. The patient tolerated the procedure well without immediate post procedural complication. FINDINGS: A total of approximately 1.5 liters  of hazy, yellow fluid was removed. Samples were sent to the laboratory as requested by the clinical team. IMPRESSION: Successful ultrasound-guided diagnostic and therapeutic paracentesis yielding 1.5 liters of peritoneal fluid. Read by: Rowe Robert, PA-C Electronically Signed   By: Jacqulynn Cadet M.D.   On: 01/15/2018 14:43   Dg Abd 2 Views  Result Date: 01/15/2018 CLINICAL DATA:  Acute generalized abdominal pain and distention. History of ovarian cancer. EXAM: ABDOMEN - 2 VIEW COMPARISON:  CT scan of January 06, 2018. FINDINGS: Mildly dilated small bowel loops are noted concerning for distal small bowel obstruction or ileus. No colonic dilatation is noted. There is no evidence of free air. No radio-opaque calculi or other significant radiographic abnormality is seen. IMPRESSION: Mildly dilated small bowel loops are noted concerning for distal small bowel obstruction or possibly ileus. Electronically Signed   By: Marijo Conception, M.D.   On: 01/15/2018 14:25    Procedures Procedures (including critical care time)  Medications Ordered in ED Medications  iopamidol (ISOVUE-300) 61 % injection (has no administration in time range)  sodium chloride (PF) 0.9 % injection (has no administration in time range)  0.9 %  sodium chloride infusion (has no administration in time range)  ceFEPIme (MAXIPIME) 2 g in sodium chloride 0.9 % 100 mL IVPB (0 g Intravenous Stopped 01/15/18 1828)  HYDROmorphone (DILAUDID) injection 0.5 mg (0.5 mg Intravenous Given 01/15/18 1827)  iopamidol (ISOVUE-300) 61 % injection 100 mL (100 mLs Intravenous Contrast Given 01/15/18 1842)  HYDROmorphone (DILAUDID) injection 0.5 mg (0.5 mg Intravenous Given 01/15/18 2157)     Initial Impression / Assessment and Plan / ED Course  I have reviewed the triage  vital signs and the nursing notes.  Pertinent labs & imaging results that were available during my care of the patient were reviewed by me and considered in my medical decision making (see chart for details).     73 year old female with recent diagnosis of ovarian cancer presents for evaluation of persistent and worsening generalized abdominal pain.  She reports she was seen on 01/06/18 for evaluation of abdominal pain.  At that time, CT scan was concerning for ovarian cancer with peritoneal carcinomatosis.  Patient was offered admission but refused and instead went home.  She followed up with gynecologic Precious Haws, MD) on 01/11/18 who arranged for future chemo, CT scan of the chest.  Patient initially had paracentesis done today.  She had discussed with the oncology department that she was having progressively worsening pain as well as constipation was told to come the ED for evaluation of possible obstruction. Patient is afebrile, non-toxic appearing, sitting comfortably on examination table.  Vitals reviewed and stable.  On exam, abdomen is distended with generalized tenderness.  We will plan to check labs.  Will repeat CT scan.  I-STAT lactic negative.  CMP shows potassium of 3.4.  Alk phos is 208.  CBC shows no leukocytosis.  Hemoglobin is 11.5.  Lipase unremarkable.  UA shows small hemoglobin.  Otherwise unremarkable.  Review of paracentesis fluid that was taken earlier today shows white blood cell count of 1,718.  Gram stain shows white blood cell present both PMN and mononuclear.  Concern for SBP, will plan for antibiotics.   CT and pelvis shows dilated fluid-filled loops of small bowel that are more extensive on prior exam with transition point in the right adnexa.  This could be related to the ovarian neoplasm but could also be early or partial small bowel.  Discussed patient with Dr. Marlou Starks (Gen  Surg). No acute surgical intervention needed. Recommends that GYN/ONC be primary consult.    We will plan for admission.  Consult hospitalist place.  2036: Hospitalist paged.   2145: Hospitalist paged.  Discussed patient with Dr. Gordy Levan. Will accept patient for admission.   Final Clinical Impressions(s) / ED Diagnoses   Final diagnoses:  Generalized abdominal pain  SBP (spontaneous bacterial peritonitis) [facility])    ED Discharge Orders    None       Desma Mcgregor 01/15/18 2334    Little, Wenda Overland, MD 01/16/18 [phone]

## 2018-01-16 ENCOUNTER — Ambulatory Visit: Payer: PPO | Admitting: Family Medicine

## 2018-01-16 ENCOUNTER — Encounter: Payer: Self-pay | Admitting: Oncology

## 2018-01-16 ENCOUNTER — Other Ambulatory Visit: Payer: Self-pay

## 2018-01-16 ENCOUNTER — Inpatient Hospital Stay (HOSPITAL_COMMUNITY): Payer: PPO

## 2018-01-16 DIAGNOSIS — D701 Agranulocytosis secondary to cancer chemotherapy: Secondary | ICD-10-CM | POA: Diagnosis present

## 2018-01-16 DIAGNOSIS — Z885 Allergy status to narcotic agent status: Secondary | ICD-10-CM | POA: Diagnosis not present

## 2018-01-16 DIAGNOSIS — K56609 Unspecified intestinal obstruction, unspecified as to partial versus complete obstruction: Secondary | ICD-10-CM | POA: Diagnosis not present

## 2018-01-16 DIAGNOSIS — R1084 Generalized abdominal pain: Secondary | ICD-10-CM | POA: Diagnosis present

## 2018-01-16 DIAGNOSIS — I480 Paroxysmal atrial fibrillation: Secondary | ICD-10-CM | POA: Diagnosis present

## 2018-01-16 DIAGNOSIS — K56600 Partial intestinal obstruction, unspecified as to cause: Secondary | ICD-10-CM

## 2018-01-16 DIAGNOSIS — Z0189 Encounter for other specified special examinations: Secondary | ICD-10-CM | POA: Diagnosis not present

## 2018-01-16 DIAGNOSIS — I700 Atherosclerosis of aorta: Secondary | ICD-10-CM | POA: Diagnosis present

## 2018-01-16 DIAGNOSIS — R971 Elevated cancer antigen 125 [CA 125]: Secondary | ICD-10-CM | POA: Diagnosis present

## 2018-01-16 DIAGNOSIS — E039 Hypothyroidism, unspecified: Secondary | ICD-10-CM | POA: Diagnosis present

## 2018-01-16 DIAGNOSIS — D638 Anemia in other chronic diseases classified elsewhere: Secondary | ICD-10-CM | POA: Diagnosis not present

## 2018-01-16 DIAGNOSIS — E785 Hyperlipidemia, unspecified: Secondary | ICD-10-CM | POA: Diagnosis present

## 2018-01-16 DIAGNOSIS — C561 Malignant neoplasm of right ovary: Secondary | ICD-10-CM | POA: Diagnosis present

## 2018-01-16 DIAGNOSIS — C800 Disseminated malignant neoplasm, unspecified: Secondary | ICD-10-CM | POA: Diagnosis not present

## 2018-01-16 DIAGNOSIS — I361 Nonrheumatic tricuspid (valve) insufficiency: Secondary | ICD-10-CM | POA: Diagnosis not present

## 2018-01-16 DIAGNOSIS — M797 Fibromyalgia: Secondary | ICD-10-CM | POA: Diagnosis present

## 2018-01-16 DIAGNOSIS — R180 Malignant ascites: Secondary | ICD-10-CM | POA: Diagnosis present

## 2018-01-16 DIAGNOSIS — C569 Malignant neoplasm of unspecified ovary: Secondary | ICD-10-CM | POA: Diagnosis not present

## 2018-01-16 DIAGNOSIS — K652 Spontaneous bacterial peritonitis: Secondary | ICD-10-CM | POA: Diagnosis present

## 2018-01-16 DIAGNOSIS — I10 Essential (primary) hypertension: Secondary | ICD-10-CM | POA: Diagnosis present

## 2018-01-16 DIAGNOSIS — G8929 Other chronic pain: Secondary | ICD-10-CM | POA: Diagnosis present

## 2018-01-16 DIAGNOSIS — R52 Pain, unspecified: Secondary | ICD-10-CM | POA: Diagnosis not present

## 2018-01-16 DIAGNOSIS — Z6826 Body mass index (BMI) 26.0-26.9, adult: Secondary | ICD-10-CM | POA: Diagnosis not present

## 2018-01-16 DIAGNOSIS — E43 Unspecified severe protein-calorie malnutrition: Secondary | ICD-10-CM | POA: Diagnosis present

## 2018-01-16 DIAGNOSIS — E876 Hypokalemia: Secondary | ICD-10-CM | POA: Diagnosis not present

## 2018-01-16 DIAGNOSIS — F17290 Nicotine dependence, other tobacco product, uncomplicated: Secondary | ICD-10-CM | POA: Diagnosis present

## 2018-01-16 DIAGNOSIS — M79604 Pain in right leg: Secondary | ICD-10-CM | POA: Diagnosis not present

## 2018-01-16 DIAGNOSIS — K219 Gastro-esophageal reflux disease without esophagitis: Secondary | ICD-10-CM | POA: Diagnosis present

## 2018-01-16 DIAGNOSIS — M79605 Pain in left leg: Secondary | ICD-10-CM | POA: Diagnosis not present

## 2018-01-16 DIAGNOSIS — C786 Secondary malignant neoplasm of retroperitoneum and peritoneum: Secondary | ICD-10-CM | POA: Diagnosis present

## 2018-01-16 DIAGNOSIS — R188 Other ascites: Secondary | ICD-10-CM | POA: Diagnosis not present

## 2018-01-16 DIAGNOSIS — M7989 Other specified soft tissue disorders: Secondary | ICD-10-CM | POA: Diagnosis not present

## 2018-01-16 LAB — CBC
HCT: 38.8 % (ref 36.0–46.0)
HCT: 34.4 % — ABNORMAL LOW (ref 36.0–46.0)
Hemoglobin: 12.4 g/dL (ref 12.0–15.0)
Hemoglobin: 10.8 g/dL — ABNORMAL LOW (ref 12.0–15.0)
MCH: 28.6 pg (ref 26.0–34.0)
MCH: 28.5 pg (ref 26.0–34.0)
MCHC: 32 g/dL (ref 30.0–36.0)
MCHC: 31.4 g/dL (ref 30.0–36.0)
MCV: 89.6 fL (ref 80.0–100.0)
MCV: 90.8 fL (ref 80.0–100.0)
PLATELETS: 433 10*3/uL — AB (ref 150–400)
Platelets: 405 10*3/uL — ABNORMAL HIGH (ref 150–400)
RBC: 4.33 MIL/uL (ref 3.87–5.11)
RBC: 3.79 MIL/uL — ABNORMAL LOW (ref 3.87–5.11)
RDW: 13.3 % (ref 11.5–15.5)
RDW: 13.3 % (ref 11.5–15.5)
WBC: 8.4 10*3/uL (ref 4.0–10.5)
WBC: 6 10*3/uL (ref 4.0–10.5)
nRBC: 0 % (ref 0.0–0.2)
nRBC: 0 % (ref 0.0–0.2)

## 2018-01-16 LAB — COMPREHENSIVE METABOLIC PANEL
ALT: 39 U/L (ref 0–44)
AST: 24 U/L (ref 15–41)
Albumin: 2.3 g/dL — ABNORMAL LOW (ref 3.5–5.0)
Alkaline Phosphatase: 156 U/L — ABNORMAL HIGH (ref 38–126)
Anion gap: 8 (ref 5–15)
BUN: 10 mg/dL (ref 8–23)
CHLORIDE: 105 mmol/L (ref 98–111)
CO2: 22 mmol/L (ref 22–32)
Calcium: 7.8 mg/dL — ABNORMAL LOW (ref 8.9–10.3)
Creatinine, Ser: 0.59 mg/dL (ref 0.44–1.00)
GFR calc Af Amer: >60 mL/min (ref >60)
Glucose, Bld: 84 mg/dL (ref 70–99)
Potassium: 3.8 mmol/L (ref 3.5–5.1)
Sodium: 135 mmol/L (ref 135–145)
Total Bilirubin: 0.2 mg/dL — ABNORMAL LOW (ref 0.3–1.2)
Total Protein: 5.6 g/dL — ABNORMAL LOW (ref 6.5–8.1)

## 2018-01-16 LAB — CREATININE, SERUM
Creatinine, Ser: 0.54 mg/dL (ref 0.44–1.00)
GFR calc Af Amer: >60 mL/min (ref >60)
GFR calc non Af Amer: >60 mL/min (ref >60)

## 2018-01-16 MED ORDER — SODIUM CHLORIDE 0.9 % IV SOLN
2.0000 g | INTRAVENOUS | Status: DC
  Administered 2018-01-16 – 2018-01-18 (×3): 2 g via INTRAVENOUS
  Filled 2018-01-16 (×2): qty 2
  Filled 2018-01-16: qty 20

## 2018-01-16 MED ORDER — ONDANSETRON HCL 4 MG PO TABS
4.0000 mg | ORAL_TABLET | Freq: Four times a day (QID) | ORAL | Status: DC | PRN
  Administered 2018-01-21: 4 mg via ORAL
  Filled 2018-01-16: qty 1

## 2018-01-16 MED ORDER — OXYCODONE HCL 5 MG PO TABS
5.0000 mg | ORAL_TABLET | ORAL | Status: DC | PRN
  Administered 2018-01-16 – 2018-01-22 (×12): 5 mg via ORAL
  Filled 2018-01-16 (×13): qty 1

## 2018-01-16 MED ORDER — ONDANSETRON HCL 4 MG/2ML IJ SOLN
4.0000 mg | Freq: Four times a day (QID) | INTRAMUSCULAR | Status: DC | PRN
  Administered 2018-01-16 – 2018-02-01 (×9): 4 mg via INTRAVENOUS
  Filled 2018-01-16 (×10): qty 2

## 2018-01-16 MED ORDER — ENOXAPARIN SODIUM 40 MG/0.4ML ~~LOC~~ SOLN
40.0000 mg | SUBCUTANEOUS | Status: DC
  Administered 2018-01-16: 40 mg via SUBCUTANEOUS
  Filled 2018-01-16: qty 0.4

## 2018-01-16 MED ORDER — LOSARTAN POTASSIUM 50 MG PO TABS
25.0000 mg | ORAL_TABLET | Freq: Every day | ORAL | Status: DC
  Administered 2018-01-16 – 2018-01-22 (×6): 25 mg via ORAL
  Filled 2018-01-16 (×7): qty 1

## 2018-01-16 MED ORDER — PANTOPRAZOLE SODIUM 40 MG PO TBEC
40.0000 mg | DELAYED_RELEASE_TABLET | Freq: Every day | ORAL | Status: DC
  Administered 2018-01-16 – 2018-01-22 (×6): 40 mg via ORAL
  Filled 2018-01-16 (×6): qty 1

## 2018-01-16 MED ORDER — HYDROMORPHONE HCL 1 MG/ML IJ SOLN
1.0000 mg | Freq: Four times a day (QID) | INTRAMUSCULAR | Status: DC | PRN
  Administered 2018-01-16 – 2018-01-17 (×4): 1 mg via INTRAVENOUS
  Filled 2018-01-16 (×4): qty 1

## 2018-01-16 MED ORDER — SIMVASTATIN 20 MG PO TABS
20.0000 mg | ORAL_TABLET | Freq: Every day | ORAL | Status: DC
  Administered 2018-01-16 – 2018-01-21 (×6): 20 mg via ORAL
  Filled 2018-01-16 (×7): qty 1

## 2018-01-16 MED ORDER — SENNA 8.6 MG PO TABS
1.0000 | ORAL_TABLET | Freq: Two times a day (BID) | ORAL | Status: DC
  Administered 2018-01-16 – 2018-02-02 (×27): 8.6 mg via ORAL
  Filled 2018-01-16 (×30): qty 1

## 2018-01-16 MED ORDER — ALPRAZOLAM 0.25 MG PO TABS
0.2500 mg | ORAL_TABLET | Freq: Every day | ORAL | Status: DC | PRN
  Administered 2018-01-16 – 2018-01-22 (×3): 0.5 mg via ORAL
  Filled 2018-01-16 (×3): qty 2

## 2018-01-16 MED ORDER — FLUTICASONE PROPIONATE 50 MCG/ACT NA SUSP
2.0000 | Freq: Every day | NASAL | Status: DC
  Administered 2018-01-16 – 2018-02-02 (×15): 2 via NASAL
  Filled 2018-01-16: qty 16

## 2018-01-16 MED ORDER — SODIUM CHLORIDE 0.9 % IV SOLN
INTRAVENOUS | Status: DC
  Administered 2018-01-16 – 2018-02-01 (×19): via INTRAVENOUS

## 2018-01-16 MED ORDER — LEVOTHYROXINE SODIUM 50 MCG PO TABS
75.0000 ug | ORAL_TABLET | Freq: Every day | ORAL | Status: DC
  Administered 2018-01-16 – 2018-01-23 (×8): 75 ug via ORAL
  Filled 2018-01-16 (×8): qty 1

## 2018-01-16 MED ORDER — METOPROLOL SUCCINATE ER 25 MG PO TB24
25.0000 mg | ORAL_TABLET | Freq: Every day | ORAL | Status: DC
  Administered 2018-01-16 – 2018-01-22 (×6): 25 mg via ORAL
  Filled 2018-01-16 (×7): qty 1

## 2018-01-16 NOTE — Progress Notes (Signed)
Patient ID: Haley [name], female   DOB: 01-31-1945, 73 y.o.   MRN: [id number]       Subjective: Patient complains of 10/10 pain in her back.  No nausea or vomiting today or yesterday.  She came to the ED secondary to pain.  She had a BM this morning, but does state she hasn't eaten a good meal since at least the day after Christmas.  Objective: Vital signs in last 24 hours: Temp:  [98.1 F (36.7 C)-98.9 F (37.2 C)] 98.2 F (36.8 C) (01/07 0621) Pulse Rate:  [61-85] 70 (01/07 0621) Resp:  [16-19] 16 (01/07 0621) BP: (112-145)/(60-80) 127/65 (01/07 0621) SpO2:  [94 %-100 %] 98 % (01/07 0621) Last BM Date: 01/15/18  Intake/Output from previous day: 01/06 0701 - 01/07 0700 In: 231.1 [I.V.:31.1; IV Piggyback:200] Out: -  Intake/Output this shift: No intake/output data recorded.  PE: Heart: regular Lungs: CTAB Abd: soft, tender, but no guarding or rebounding. +BS, protuberant   Lab Results:  Recent Labs    01/16/18 0051 01/16/18 0515  WBC 8.4 6.0  HGB 12.4 10.8*  HCT 38.8 34.4*  PLT 433* 405*   BMET Recent Labs    01/15/18 1532 01/16/18 0051 01/16/18 0515  NA 134*  --  135  K 3.4*  --  3.8  CL 100  --  105  CO2 24  --  22  GLUCOSE 106*  --  84  BUN 10  --  10  CREATININE 0.54 0.54 0.59  CALCIUM 8.4*  --  7.8*   PT/INR No results for input(s): LABPROT, INR in the last 72 hours. CMP     Component Value Date/Time   NA 135 01/16/2018 0515   K 3.8 01/16/2018 0515   CL 105 01/16/2018 0515   CO2 22 01/16/2018 0515   GLUCOSE 84 01/16/2018 0515   BUN 10 01/16/2018 0515   CREATININE 0.59 01/16/2018 0515   CREATININE 0.76 11/17/2015 1005   CALCIUM 7.8 (L) 01/16/2018 0515   PROT 5.6 (L) 01/16/2018 0515   ALBUMIN 2.3 (L) 01/16/2018 0515   AST 24 01/16/2018 0515   ALT 39 01/16/2018 0515   ALKPHOS 156 (H) 01/16/2018 0515   BILITOT 0.2 (L) 01/16/2018 0515   GFRNONAA >60 01/16/2018 0515   GFRAA >60 01/16/2018 0515   Lipase     Component Value Date/Time   LIPASE 30 01/15/2018 1532       Studies/Results: Ct Chest W Contrast  Result Date: 01/15/2018 CLINICAL DATA:  Abdominal distention with no bowel movements. Radiographs demonstrated small-bowel obstruction or ileus. History of hysterectomy and appendectomy. EXAM: CT CHEST, ABDOMEN, AND PELVIS WITH CONTRAST TECHNIQUE: Multidetector CT imaging of the chest, abdomen and pelvis was performed following the standard protocol during bolus administration of intravenous contrast. CONTRAST:  167mL ISOVUE-300 IOPAMIDOL (ISOVUE-300) INJECTION 61% COMPARISON:  Radiographs of the abdomen from 01/15/2018, CT abdomen pelvis 01/06/2018 FINDINGS: CT CHEST FINDINGS Cardiovascular: Conventional branch pattern of the great vessels with mild calcific atherosclerosis. No aneurysm or dissection. The thoracic aorta is nonaneurysmal demonstrates mild atherosclerosis at the arch and along the descending portion. Heart size is top normal without pericardial effusion or thickening. No significant coronary arteriosclerosis. No large central pulmonary embolus. Mediastinum/Nodes: No enlarged mediastinal, hilar, or axillary lymph nodes. The thyroid gland is excluded on this study. Patent trachea and mainstem bronchi. Unremarkable CT appearance of the thoracic esophagus. Lungs/Pleura: Linear atelectasis and/or scarring is noted in the left lower lobe. No acute pulmonary consolidation or dominant mass. No pneumothorax is  noted. Musculoskeletal: No chest wall mass or suspicious bone lesions identified. CT ABDOMEN PELVIS FINDINGS Hepatobiliary: Homogeneous enhancement of the liver. Small left hepatic lobe cyst measuring approximately 1 cm, series 2/42. The gallbladder is distended without stones possibly from a fasting state. No mural thickening or pericholecystic fluid. Small amount perihepatic ascites is identified. Slight intrahepatic ductal dilatation may be due to fullness of the gallbladder. Pancreas: Coarse calcification noted in the body  of the pancreas. No acute inflammatory process, mass or ductal dilatation. Spleen: Normal size spleen without mass. Adrenals/Urinary Tract: Normal bilateral adrenal glands and symmetric cortical enhancement of both kidneys. Delayed imaging through both kidneys demonstrate symmetric pyelograms without obstructive uropathy. The urinary bladder is physiologically distended and unremarkable for the degree of distention. Stomach/Bowel: Small hiatal hernia is identified. The stomach is nondistended. The duodenal sweep and ligament of Treitz is unremarkable. Fluid-filled distended jejunal loops to 4.1 cm are identified with transition point suggested in the right lower quadrant, series 2, image 98. Additionally there is a short segmental area of luminal narrowing with mural thickening possibly representing small stricture or involvement by neoplasm is not excluded, series 2/89 measuring up to 1.7 cm in length with single wall thickness of up to 6 mm. The transition point appears to be adjacent to the right ovarian complex cystic mass and may be adherent as previously suggested on prior report. The patient is status post appendectomy. The colon is relatively decompressed in appearance. Vascular/Lymphatic: Patent portal and splenic veins. Moderate aortic iliac atherosclerosis without aneurysm. Lymphadenopathy identified by CT size criteria. Reproductive: Complex cystic mass with septations and possible soft tissue components along the caudal aspect is identified in the right adnexa measuring 4.9 x 4 x 4.7 cm (AP by transverse by craniocaudad). Findings are concerning for possible ovarian neoplasm. Further correlation with MRI is recommended. A more simple appearing left adnexal cyst measuring 5.3 x 3.8 x 4.5 cm is identified. The patient is status post hysterectomy. Other: Small volume of ascites is noted in the upper abdomen. There appears to be rind like thickening of the peritoneum concerning for peritoneal caking, series  2/61 in the left upper quadrant in particular concerning for peritoneal carcinomatosis. Musculoskeletal: No acute or significant osseous findings. IMPRESSION: Chest CT: 1. No active cardiopulmonary disease. 2. Aortic atherosclerosis without aneurysm or dissection. 3. No large central pulmonary embolus. CT AP: 1. Dilated fluid-filled loops of small bowel are redemonstrated slightly more extensive than on prior exam with transition point likely in the right adnexa adjacent to a complex cystic mass concerning for ovarian neoplasm given septations and soft tissue nodularity. This raises concern for early or partial SBO. This soft tissue mass measures 4.9 x 4 x 4.7 cm and has not changed since prior recent comparison. Additional short segmental area of luminal narrowing is noted in the right lower quadrant involving small bowel for which stigmata of peritoneal carcinomatosis or small-bowel metastatic implants might account for this. 2. Redemonstration of small volume of ascites predominantly in the upper abdomen surrounding the liver and spleen. 3. Redemonstration of thick bandlike omental thickening concerning for peritoneal carcinomatosis. Electronically Signed   By: Ashley Royalty M.D.   On: 01/15/2018 19:22   Ct Abdomen Pelvis W Contrast  Result Date: 01/15/2018 CLINICAL DATA:  Abdominal distention with no bowel movements. Radiographs demonstrated small-bowel obstruction or ileus. History of hysterectomy and appendectomy. EXAM: CT CHEST, ABDOMEN, AND PELVIS WITH CONTRAST TECHNIQUE: Multidetector CT imaging of the chest, abdomen and pelvis was performed following the standard protocol during  bolus administration of intravenous contrast. CONTRAST:  144mL ISOVUE-300 IOPAMIDOL (ISOVUE-300) INJECTION 61% COMPARISON:  Radiographs of the abdomen from 01/15/2018, CT abdomen pelvis 01/06/2018 FINDINGS: CT CHEST FINDINGS Cardiovascular: Conventional branch pattern of the great vessels with mild calcific atherosclerosis. No  aneurysm or dissection. The thoracic aorta is nonaneurysmal demonstrates mild atherosclerosis at the arch and along the descending portion. Heart size is top normal without pericardial effusion or thickening. No significant coronary arteriosclerosis. No large central pulmonary embolus. Mediastinum/Nodes: No enlarged mediastinal, hilar, or axillary lymph nodes. The thyroid gland is excluded on this study. Patent trachea and mainstem bronchi. Unremarkable CT appearance of the thoracic esophagus. Lungs/Pleura: Linear atelectasis and/or scarring is noted in the left lower lobe. No acute pulmonary consolidation or dominant mass. No pneumothorax is noted. Musculoskeletal: No chest wall mass or suspicious bone lesions identified. CT ABDOMEN PELVIS FINDINGS Hepatobiliary: Homogeneous enhancement of the liver. Small left hepatic lobe cyst measuring approximately 1 cm, series 2/42. The gallbladder is distended without stones possibly from a fasting state. No mural thickening or pericholecystic fluid. Small amount perihepatic ascites is identified. Slight intrahepatic ductal dilatation may be due to fullness of the gallbladder. Pancreas: Coarse calcification noted in the body of the pancreas. No acute inflammatory process, mass or ductal dilatation. Spleen: Normal size spleen without mass. Adrenals/Urinary Tract: Normal bilateral adrenal glands and symmetric cortical enhancement of both kidneys. Delayed imaging through both kidneys demonstrate symmetric pyelograms without obstructive uropathy. The urinary bladder is physiologically distended and unremarkable for the degree of distention. Stomach/Bowel: Small hiatal hernia is identified. The stomach is nondistended. The duodenal sweep and ligament of Treitz is unremarkable. Fluid-filled distended jejunal loops to 4.1 cm are identified with transition point suggested in the right lower quadrant, series 2, image 98. Additionally there is a short segmental area of luminal  narrowing with mural thickening possibly representing small stricture or involvement by neoplasm is not excluded, series 2/89 measuring up to 1.7 cm in length with single wall thickness of up to 6 mm. The transition point appears to be adjacent to the right ovarian complex cystic mass and may be adherent as previously suggested on prior report. The patient is status post appendectomy. The colon is relatively decompressed in appearance. Vascular/Lymphatic: Patent portal and splenic veins. Moderate aortic iliac atherosclerosis without aneurysm. Lymphadenopathy identified by CT size criteria. Reproductive: Complex cystic mass with septations and possible soft tissue components along the caudal aspect is identified in the right adnexa measuring 4.9 x 4 x 4.7 cm (AP by transverse by craniocaudad). Findings are concerning for possible ovarian neoplasm. Further correlation with MRI is recommended. A more simple appearing left adnexal cyst measuring 5.3 x 3.8 x 4.5 cm is identified. The patient is status post hysterectomy. Other: Small volume of ascites is noted in the upper abdomen. There appears to be rind like thickening of the peritoneum concerning for peritoneal caking, series 2/61 in the left upper quadrant in particular concerning for peritoneal carcinomatosis. Musculoskeletal: No acute or significant osseous findings. IMPRESSION: Chest CT: 1. No active cardiopulmonary disease. 2. Aortic atherosclerosis without aneurysm or dissection. 3. No large central pulmonary embolus. CT AP: 1. Dilated fluid-filled loops of small bowel are redemonstrated slightly more extensive than on prior exam with transition point likely in the right adnexa adjacent to a complex cystic mass concerning for ovarian neoplasm given septations and soft tissue nodularity. This raises concern for early or partial SBO. This soft tissue mass measures 4.9 x 4 x 4.7 cm and has not changed since prior  recent comparison. Additional short segmental area  of luminal narrowing is noted in the right lower quadrant involving small bowel for which stigmata of peritoneal carcinomatosis or small-bowel metastatic implants might account for this. 2. Redemonstration of small volume of ascites predominantly in the upper abdomen surrounding the liver and spleen. 3. Redemonstration of thick bandlike omental thickening concerning for peritoneal carcinomatosis. Electronically Signed   By: Ashley Royalty M.D.   On: 01/15/2018 19:22   US Paracentesis  Result Date: 01/15/2018 INDICATION: Patient with history of abdominal pain, complex right adnexal cystic mass, elevated CA-125, omental nodularity, ascites. Request made for diagnostic and therapeutic paracentesis. EXAM: ULTRASOUND GUIDED DIAGNOSTIC AND THERAPEUTIC PARACENTESIS MEDICATIONS: None COMPLICATIONS: None immediate. PROCEDURE: Informed written consent was obtained from the patient after a discussion of the risks, benefits and alternatives to treatment. A timeout was performed prior to the initiation of the procedure. Initial ultrasound scanning demonstrates a small amount of ascites within the right lower abdominal quadrant. The right lower abdomen was prepped and draped in the usual sterile fashion. 1% lidocaine was used for local anesthesia. Following this, a 19 gauge, 10-cm, Yueh catheter was introduced. An ultrasound image was saved for documentation purposes. The paracentesis was performed. The catheter was removed and a dressing was applied. The patient tolerated the procedure well without immediate post procedural complication. FINDINGS: A total of approximately 1.5 liters of hazy, yellow fluid was removed. Samples were sent to the laboratory as requested by the clinical team. IMPRESSION: Successful ultrasound-guided diagnostic and therapeutic paracentesis yielding 1.5 liters of peritoneal fluid. Read by: Rowe Robert, PA-C Electronically Signed   By: Jacqulynn Cadet M.D.   On: 01/15/2018 14:43   Dg Abd 2  Views  Result Date: 01/15/2018 CLINICAL DATA:  Acute generalized abdominal pain and distention. History of ovarian cancer. EXAM: ABDOMEN - 2 VIEW COMPARISON:  CT scan of January 06, 2018. FINDINGS: Mildly dilated small bowel loops are noted concerning for distal small bowel obstruction or ileus. No colonic dilatation is noted. There is no evidence of free air. No radio-opaque calculi or other significant radiographic abnormality is seen. IMPRESSION: Mildly dilated small bowel loops are noted concerning for distal small bowel obstruction or possibly ileus. Electronically Signed   By: Marijo Conception, M.D.   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  Her scan certainly does show dilated small bowel and some areas of concern for narrowing and transition.  Suspect this is a chronic thing for now that hopefully may improve after neoadjuvant therapy. -agree with onc evaluation -will let her have some sips of liquids from the floor and see how she does today.  Once she is tolerating liquids, she will need to be on nutritional supplements to help boost her nutrition as it is likely poor. -no plans for acute surgical intervention at this time. -will  follow  FEN - NPO, sips of clears from floor VTE - Lovenox ID - rocephin, doesn't need from bowel obstruction standpoint.  No evidence of acute infection on her scan and her WBC is normal.   LOS: 0 days     Henreitta Cea , [facility] Surgery 01/16/2018, 11:20 AM Pager: [phone]

## 2018-01-16 NOTE — ED Notes (Signed)
ED TO INPATIENT HANDOFF REPORT  Name/Age/Gender Haley [name] 73 y.o. female  Code Status    Code Status Orders  (From admission, onward)         Start     Ordered   01/16/18 0051  Full code  Continuous     01/16/18 0050        Code Status History    This patient has a current code status but no historical code status.    Advance Directive Documentation     Most Recent Value  Type of Advance Directive  Living will  Pre-existing out of facility DNR order (yellow form or pink MOST form)  -  "MOST" Form in Place?  -      Home/SNF/Other Home  Chief Complaint Constipation and Swelling  Level of Care/Admitting Diagnosis ED Disposition    ED Disposition Condition [place]: [place] [[id number]]  Level of Care: Med-Surg [16]  Diagnosis: Abdominal pain [638756]  Admitting Physician: Shelbie Proctor [[id number]]  Attending Physician: Shelbie Proctor [[id number]]  PT Class (Do Not Modify): Observation [104]  PT Acc Code (Do Not Modify): Observation [10022]       Medical History Past Medical History:  Diagnosis Date  . Allergy    SEASONAL  . Cataract    BILATERAL  . Fibromyalgia   . GERD (gastroesophageal reflux disease)   . H/O echocardiogram 04/28/08   EF>55% trace mitral regurgitation, No significant valvular pathology  . Hemorrhoids    internal  . History of stress test 02/08/2011   Normal Myocardial perfusion study, this is a low risk scan, No prior study available for comparison  . Hyperlipidemia   . MVP (mitral valve prolapse)    mild and MR  . OA (osteoarthritis)   . Osteoporosis   . Paroxysmal A-fib ([place])   . Sleep apnea    does not use prescribed CPAP  . Thyroid disease    HYPO  . Varicosities of leg   . Vitamin D deficiency     Allergies Allergies  Allergen Reactions  . Tramadol Hcl     REACTION: jittery  . Tramadol Nausea And Vomiting    IV Location/Drains/Wounds Patient  Lines/Drains/Airways Status   Active Line/Drains/Airways    Name:   Placement date:   Placement time:   Site:   Days:   Peripheral IV 01/06/18 Left Antecubital   01/06/18    0120    Antecubital   10   Peripheral IV 01/15/18 Right Wrist   01/15/18    1645    Wrist   1          Labs/Imaging Results for orders placed or performed during the hospital encounter of 01/15/18 (from the past 48 hour(s))  Gram stain     Status: None   Collection Time: 01/15/18  2:35 PM  Result Value Ref Range   Specimen Description FLUID PERITONEAL    Special Requests NONE    Gram Stain      WBC PRESENT,BOTH PMN AND MONONUCLEAR NO ORGANISMS SEEN CYTOSPIN SMEAR Performed at [facility], [place] [address] [place], [place] [address]    Report Status 01/15/2018 FINAL   Lipase, blood     Status: None   Collection Time: 01/15/18  3:32 PM  Result Value Ref Range   Lipase 30 11 - 51 U/L    Comment: Performed at [facility], [place] [address], [place], [place] [address]  Comprehensive metabolic panel  Status: Abnormal   Collection Time: 01/15/18  3:32 PM  Result Value Ref Range   Sodium 134 (L) 135 - 145 mmol/L   Potassium 3.4 (L) 3.5 - 5.1 mmol/L   Chloride 100 98 - 111 mmol/L   CO2 24 22 - 32 mmol/L   Glucose, Bld 106 (H) 70 - 99 mg/dL   BUN 10 8 - 23 mg/dL   Creatinine, Ser 0.54 0.44 - 1.00 mg/dL   Calcium 8.4 (L) 8.9 - 10.3 mg/dL   Total Protein 6.4 (L) 6.5 - 8.1 g/dL   Albumin 2.8 (L) 3.5 - 5.0 g/dL   AST 40 15 - 41 U/L   ALT 56 (H) 0 - 44 U/L   Alkaline Phosphatase 208 (H) 38 - 126 U/L   Total Bilirubin 0.3 0.3 - 1.2 mg/dL   GFR calc non Af Amer >60 >60 mL/min   GFR calc Af Amer >60 >60 mL/min   Anion gap 10 5 - 15    Comment: Performed at [facility], [place] [address], [place], [place] [address]  CBC     Status: Abnormal   Collection Time: 01/15/18  3:32 PM  Result Value Ref Range   WBC 9.0 4.0 - 10.5 K/uL   RBC 3.99 3.87 - 5.11 MIL/uL   Hemoglobin  11.5 (L) 12.0 - 15.0 g/dL   HCT 35.5 (L) 36.0 - 46.0 %   MCV 89.0 80.0 - 100.0 fL   MCH 28.8 26.0 - 34.0 pg   MCHC 32.4 30.0 - 36.0 g/dL   RDW 13.2 11.5 - 15.5 %   Platelets 387 150 - 400 K/uL   nRBC 0.0 0.0 - 0.2 %    Comment: Performed at [facility], [place] [address], [place], [place] [address]  Urinalysis, Routine w reflex microscopic     Status: Abnormal   Collection Time: 01/15/18  3:55 PM  Result Value Ref Range   Color, Urine STRAW (A) YELLOW   APPearance CLEAR CLEAR   Specific Gravity, Urine 1.002 (L) 1.005 - 1.030   pH 6.0 5.0 - 8.0   Glucose, UA NEGATIVE NEGATIVE mg/dL   Hgb urine dipstick SMALL (A) NEGATIVE   Bilirubin Urine NEGATIVE NEGATIVE   Ketones, ur NEGATIVE NEGATIVE mg/dL   Protein, ur NEGATIVE NEGATIVE mg/dL   Nitrite NEGATIVE NEGATIVE   Leukocytes, UA NEGATIVE NEGATIVE   WBC, UA 0-5 0 - 5 WBC/hpf   Bacteria, UA NONE SEEN NONE SEEN    Comment: Performed at [facility], [place] [address] [place], [place] [address]  I-Stat CG4 Lactic Acid, ED     Status: None   Collection Time: 01/15/18  4:45 PM  Result Value Ref Range   Lactic Acid, Venous 0.61 0.5 - 1.9 mmol/L   Ct Chest W Contrast  Result Date: 01/15/2018 CLINICAL DATA:  Abdominal distention with no bowel movements. Radiographs demonstrated small-bowel obstruction or ileus. History of hysterectomy and appendectomy. EXAM: CT CHEST, ABDOMEN, AND PELVIS WITH CONTRAST TECHNIQUE: Multidetector CT imaging of the chest, abdomen and pelvis was performed following the standard protocol during bolus administration of intravenous contrast. CONTRAST:  150mL ISOVUE-300 IOPAMIDOL (ISOVUE-300) INJECTION 61% COMPARISON:  Radiographs of the abdomen from 01/15/2018, CT abdomen pelvis 01/06/2018 FINDINGS: CT CHEST FINDINGS Cardiovascular: Conventional branch pattern of the great vessels with mild calcific atherosclerosis. No aneurysm or dissection. The thoracic aorta is nonaneurysmal demonstrates  mild atherosclerosis at the arch and along the descending portion. Heart size is top normal without pericardial effusion or  thickening. No significant coronary arteriosclerosis. No large central pulmonary embolus. Mediastinum/Nodes: No enlarged mediastinal, hilar, or axillary lymph nodes. The thyroid gland is excluded on this study. Patent trachea and mainstem bronchi. Unremarkable CT appearance of the thoracic esophagus. Lungs/Pleura: Linear atelectasis and/or scarring is noted in the left lower lobe. No acute pulmonary consolidation or dominant mass. No pneumothorax is noted. Musculoskeletal: No chest wall mass or suspicious bone lesions identified. CT ABDOMEN PELVIS FINDINGS Hepatobiliary: Homogeneous enhancement of the liver. Small left hepatic lobe cyst measuring approximately 1 cm, series 2/42. The gallbladder is distended without stones possibly from a fasting state. No mural thickening or pericholecystic fluid. Small amount perihepatic ascites is identified. Slight intrahepatic ductal dilatation may be due to fullness of the gallbladder. Pancreas: Coarse calcification noted in the body of the pancreas. No acute inflammatory process, mass or ductal dilatation. Spleen: Normal size spleen without mass. Adrenals/Urinary Tract: Normal bilateral adrenal glands and symmetric cortical enhancement of both kidneys. Delayed imaging through both kidneys demonstrate symmetric pyelograms without obstructive uropathy. The urinary bladder is physiologically distended and unremarkable for the degree of distention. Stomach/Bowel: Small hiatal hernia is identified. The stomach is nondistended. The duodenal sweep and ligament of Treitz is unremarkable. Fluid-filled distended jejunal loops to 4.1 cm are identified with transition point suggested in the right lower quadrant, series 2, image 98. Additionally there is a short segmental area of luminal narrowing with mural thickening possibly representing small stricture or  involvement by neoplasm is not excluded, series 2/89 measuring up to 1.7 cm in length with single wall thickness of up to 6 mm. The transition point appears to be adjacent to the right ovarian complex cystic mass and may be adherent as previously suggested on prior report. The patient is status post appendectomy. The colon is relatively decompressed in appearance. Vascular/Lymphatic: Patent portal and splenic veins. Moderate aortic iliac atherosclerosis without aneurysm. Lymphadenopathy identified by CT size criteria. Reproductive: Complex cystic mass with septations and possible soft tissue components along the caudal aspect is identified in the right adnexa measuring 4.9 x 4 x 4.7 cm (AP by transverse by craniocaudad). Findings are concerning for possible ovarian neoplasm. Further correlation with MRI is recommended. A more simple appearing left adnexal cyst measuring 5.3 x 3.8 x 4.5 cm is identified. The patient is status post hysterectomy. Other: Small volume of ascites is noted in the upper abdomen. There appears to be rind like thickening of the peritoneum concerning for peritoneal caking, series 2/61 in the left upper quadrant in particular concerning for peritoneal carcinomatosis. Musculoskeletal: No acute or significant osseous findings. IMPRESSION: Chest CT: 1. No active cardiopulmonary disease. 2. Aortic atherosclerosis without aneurysm or dissection. 3. No large central pulmonary embolus. CT AP: 1. Dilated fluid-filled loops of small bowel are redemonstrated slightly more extensive than on prior exam with transition point likely in the right adnexa adjacent to a complex cystic mass concerning for ovarian neoplasm given septations and soft tissue nodularity. This raises concern for early or partial SBO. This soft tissue mass measures 4.9 x 4 x 4.7 cm and has not changed since prior recent comparison. Additional short segmental area of luminal narrowing is noted in the right lower quadrant involving small  bowel for which stigmata of peritoneal carcinomatosis or small-bowel metastatic implants might account for this. 2. Redemonstration of small volume of ascites predominantly in the upper abdomen surrounding the liver and spleen. 3. Redemonstration of thick bandlike omental thickening concerning for peritoneal carcinomatosis. Electronically Signed   By: Meredith Leeds.D.  On: 01/15/2018 19:22   Ct Abdomen Pelvis W Contrast  Result Date: 01/15/2018 CLINICAL DATA:  Abdominal distention with no bowel movements. Radiographs demonstrated small-bowel obstruction or ileus. History of hysterectomy and appendectomy. EXAM: CT CHEST, ABDOMEN, AND PELVIS WITH CONTRAST TECHNIQUE: Multidetector CT imaging of the chest, abdomen and pelvis was performed following the standard protocol during bolus administration of intravenous contrast. CONTRAST:  177mL ISOVUE-300 IOPAMIDOL (ISOVUE-300) INJECTION 61% COMPARISON:  Radiographs of the abdomen from 01/15/2018, CT abdomen pelvis 01/06/2018 FINDINGS: CT CHEST FINDINGS Cardiovascular: Conventional branch pattern of the great vessels with mild calcific atherosclerosis. No aneurysm or dissection. The thoracic aorta is nonaneurysmal demonstrates mild atherosclerosis at the arch and along the descending portion. Heart size is top normal without pericardial effusion or thickening. No significant coronary arteriosclerosis. No large central pulmonary embolus. Mediastinum/Nodes: No enlarged mediastinal, hilar, or axillary lymph nodes. The thyroid gland is excluded on this study. Patent trachea and mainstem bronchi. Unremarkable CT appearance of the thoracic esophagus. Lungs/Pleura: Linear atelectasis and/or scarring is noted in the left lower lobe. No acute pulmonary consolidation or dominant mass. No pneumothorax is noted. Musculoskeletal: No chest wall mass or suspicious bone lesions identified. CT ABDOMEN PELVIS FINDINGS Hepatobiliary: Homogeneous enhancement of the liver. Small left hepatic  lobe cyst measuring approximately 1 cm, series 2/42. The gallbladder is distended without stones possibly from a fasting state. No mural thickening or pericholecystic fluid. Small amount perihepatic ascites is identified. Slight intrahepatic ductal dilatation may be due to fullness of the gallbladder. Pancreas: Coarse calcification noted in the body of the pancreas. No acute inflammatory process, mass or ductal dilatation. Spleen: Normal size spleen without mass. Adrenals/Urinary Tract: Normal bilateral adrenal glands and symmetric cortical enhancement of both kidneys. Delayed imaging through both kidneys demonstrate symmetric pyelograms without obstructive uropathy. The urinary bladder is physiologically distended and unremarkable for the degree of distention. Stomach/Bowel: Small hiatal hernia is identified. The stomach is nondistended. The duodenal sweep and ligament of Treitz is unremarkable. Fluid-filled distended jejunal loops to 4.1 cm are identified with transition point suggested in the right lower quadrant, series 2, image 98. Additionally there is a short segmental area of luminal narrowing with mural thickening possibly representing small stricture or involvement by neoplasm is not excluded, series 2/89 measuring up to 1.7 cm in length with single wall thickness of up to 6 mm. The transition point appears to be adjacent to the right ovarian complex cystic mass and may be adherent as previously suggested on prior report. The patient is status post appendectomy. The colon is relatively decompressed in appearance. Vascular/Lymphatic: Patent portal and splenic veins. Moderate aortic iliac atherosclerosis without aneurysm. Lymphadenopathy identified by CT size criteria. Reproductive: Complex cystic mass with septations and possible soft tissue components along the caudal aspect is identified in the right adnexa measuring 4.9 x 4 x 4.7 cm (AP by transverse by craniocaudad). Findings are concerning for possible  ovarian neoplasm. Further correlation with MRI is recommended. A more simple appearing left adnexal cyst measuring 5.3 x 3.8 x 4.5 cm is identified. The patient is status post hysterectomy. Other: Small volume of ascites is noted in the upper abdomen. There appears to be rind like thickening of the peritoneum concerning for peritoneal caking, series 2/61 in the left upper quadrant in particular concerning for peritoneal carcinomatosis. Musculoskeletal: No acute or significant osseous findings. IMPRESSION: Chest CT: 1. No active cardiopulmonary disease. 2. Aortic atherosclerosis without aneurysm or dissection. 3. No large central pulmonary embolus. CT AP: 1. Dilated fluid-filled loops of small  bowel are redemonstrated slightly more extensive than on prior exam with transition point likely in the right adnexa adjacent to a complex cystic mass concerning for ovarian neoplasm given septations and soft tissue nodularity. This raises concern for early or partial SBO. This soft tissue mass measures 4.9 x 4 x 4.7 cm and has not changed since prior recent comparison. Additional short segmental area of luminal narrowing is noted in the right lower quadrant involving small bowel for which stigmata of peritoneal carcinomatosis or small-bowel metastatic implants might account for this. 2. Redemonstration of small volume of ascites predominantly in the upper abdomen surrounding the liver and spleen. 3. Redemonstration of thick bandlike omental thickening concerning for peritoneal carcinomatosis. Electronically Signed   By: Ashley Royalty M.D.   On: 01/15/2018 19:22   US Paracentesis  Result Date: 01/15/2018 INDICATION: Patient with history of abdominal pain, complex right adnexal cystic mass, elevated CA-125, omental nodularity, ascites. Request made for diagnostic and therapeutic paracentesis. EXAM: ULTRASOUND GUIDED DIAGNOSTIC AND THERAPEUTIC PARACENTESIS MEDICATIONS: None COMPLICATIONS: None immediate. PROCEDURE: Informed  written consent was obtained from the patient after a discussion of the risks, benefits and alternatives to treatment. A timeout was performed prior to the initiation of the procedure. Initial ultrasound scanning demonstrates a small amount of ascites within the right lower abdominal quadrant. The right lower abdomen was prepped and draped in the usual sterile fashion. 1% lidocaine was used for local anesthesia. Following this, a 19 gauge, 10-cm, Yueh catheter was introduced. An ultrasound image was saved for documentation purposes. The paracentesis was performed. The catheter was removed and a dressing was applied. The patient tolerated the procedure well without immediate post procedural complication. FINDINGS: A total of approximately 1.5 liters of hazy, yellow fluid was removed. Samples were sent to the laboratory as requested by the clinical team. IMPRESSION: Successful ultrasound-guided diagnostic and therapeutic paracentesis yielding 1.5 liters of peritoneal fluid. Read by: Rowe Robert, PA-C Electronically Signed   By: Jacqulynn Cadet M.D.   On: 01/15/2018 14:43   Dg Abd 2 Views  Result Date: 01/15/2018 CLINICAL DATA:  Acute generalized abdominal pain and distention. History of ovarian cancer. EXAM: ABDOMEN - 2 VIEW COMPARISON:  CT scan of January 06, 2018. FINDINGS: Mildly dilated small bowel loops are noted concerning for distal small bowel obstruction or ileus. No colonic dilatation is noted. There is no evidence of free air. No radio-opaque calculi or other significant radiographic abnormality is seen. IMPRESSION: Mildly dilated small bowel loops are noted concerning for distal small bowel obstruction or possibly ileus. Electronically Signed   By: Marijo Conception, M.D.   On: 01/15/2018 14:25   None  Pending Labs Unresulted Labs (From admission, onward)    Start     Ordered   01/23/18 0500  Creatinine, serum  (enoxaparin (LOVENOX)    CrCl >/= 30 ml/min)  Weekly,   R    Comments:  while on  enoxaparin therapy    01/16/18 0050   01/17/18 0730  CBC with Differential/Platelet  ONCE - STAT,   R     01/15/18 2055   01/17/18 0730  Comprehensive metabolic panel  ONCE - STAT,   R     01/15/18 2055   01/17/18 0730  Protime-INR  ONCE - STAT,   R     01/15/18 2055   01/16/18 0500  Comprehensive metabolic panel  Tomorrow morning,   R     01/16/18 0050   01/16/18 0500  CBC  Tomorrow morning,   R  01/16/18 0050   01/16/18 0051  CBC  (enoxaparin (LOVENOX)    CrCl >/= 30 ml/min)  Once,   R    Comments:  Baseline for enoxaparin therapy IF NOT ALREADY DRAWN.  Notify MD if PLT < 100 K.    01/16/18 0050   01/16/18 0051  Creatinine, serum  (enoxaparin (LOVENOX)    CrCl >/= 30 ml/min)  Once,   R    Comments:  Baseline for enoxaparin therapy IF NOT ALREADY DRAWN.    01/16/18 0050   01/15/18 1628  Culture, blood (routine x 2)  BLOOD CULTURE X 2,   STAT     01/15/18 1627   01/15/18 1435  Culture, body fluid-bottle  Once,   R     01/15/18 1435          Vitals/Pain Today's Vitals   01/15/18 2157 01/15/18 2300 01/15/18 2328 01/16/18 0003  BP: 123/62 114/63  112/62  Pulse: 75 63  65  Resp: 16   18  Temp: 98.3 F (36.8 C)     TempSrc: Oral     SpO2: 98% 94%  96%  PainSc: 10-Worst pain ever  5      Isolation Precautions No active isolations  Medications Medications  iopamidol (ISOVUE-300) 61 % injection (has no administration in time range)  sodium chloride (PF) 0.9 % injection (has no administration in time range)  0.9 %  sodium chloride infusion (has no administration in time range)  cefTRIAXone (ROCEPHIN) 2 g in sodium chloride 0.9 % 100 mL IVPB (has no administration in time range)  oxyCODONE (Oxy IR/ROXICODONE) immediate release tablet 5 mg (has no administration in time range)  losartan (COZAAR) tablet 25 mg (has no administration in time range)  metoprolol succinate (TOPROL-XL) 24 hr tablet 25 mg (has no administration in time range)  simvastatin (ZOCOR) tablet 20 mg  (has no administration in time range)  ALPRAZolam (XANAX) tablet 0.25-0.5 mg (has no administration in time range)  levothyroxine (SYNTHROID, LEVOTHROID) tablet 75 mcg (has no administration in time range)  pantoprazole (PROTONIX) EC tablet 40 mg (has no administration in time range)  fluticasone (FLONASE) 50 MCG/ACT nasal spray 2 spray (has no administration in time range)  enoxaparin (LOVENOX) injection 40 mg (has no administration in time range)  senna (SENOKOT) tablet 8.6 mg (has no administration in time range)  ondansetron (ZOFRAN) tablet 4 mg (has no administration in time range)    Or  ondansetron (ZOFRAN) injection 4 mg (has no administration in time range)  HYDROmorphone (DILAUDID) injection 1 mg (has no administration in time range)  ceFEPIme (MAXIPIME) 2 g in sodium chloride 0.9 % 100 mL IVPB (0 g Intravenous Stopped 01/15/18 1828)  HYDROmorphone (DILAUDID) injection 0.5 mg (0.5 mg Intravenous Given 01/15/18 1827)  iopamidol (ISOVUE-300) 61 % injection 100 mL (100 mLs Intravenous Contrast Given 01/15/18 1842)  HYDROmorphone (DILAUDID) injection 0.5 mg (0.5 mg Intravenous Given 01/15/18 2157)    Mobility walks

## 2018-01-16 NOTE — Progress Notes (Signed)
Referring Physician(s): Phelps,S  Supervising Physician: Daryll Brod  Patient Status:  [facility] - In-pt  Chief Complaint:  Abdominal/pelvic/back pain  Subjective: Patient familiar to IR service from paracentesis yesterday yielding 1.5 L of fluid, cytology pending and cell count with 1718 WBCs, cx neg to date.  She has a history of abdominal/pelvic/back pain, markedly elevated CA 125 and recent imaging revealing complex cystic mass at the right adnexa concerning for primary ovarian malignancy along with diffuse nodularity along the omentum concerning for peritoneal carcinomatosis and ascites.  She was admitted to [facility] yesterday with persistent abdominal/pelvic/back pain and findings of dilated fluid-filled loops of small bowel more extensive than on prior exam with transition point likely in the right adnexa adjacent to the complex cystic mass and concerning for partial small bowel obstruction.  She was on outpatient IR schedule tomorrow for omental mass biopsy.  Request now received to perform biopsy tomorrow as inpatient.  She currently denies fever, headache, chest pain, worsening dyspnea, cough, vomiting or abnormal bleeding.  Past Medical History:  Diagnosis Date  . Allergy    SEASONAL  . Cataract    BILATERAL  . Fibromyalgia   . GERD (gastroesophageal reflux disease)   . H/O echocardiogram 04/28/08   EF>55% trace mitral regurgitation, No significant valvular pathology  . Hemorrhoids    internal  . History of stress test 02/08/2011   Normal Myocardial perfusion study, this is a low risk scan, No prior study available for comparison  . Hyperlipidemia   . MVP (mitral valve prolapse)    mild and MR  . OA (osteoarthritis)   . Osteoporosis   . Paroxysmal A-fib ([place])   . Sleep apnea    does not use prescribed CPAP  . Thyroid disease    HYPO  . Varicosities of leg   . Vitamin D deficiency    Past Surgical History:  Procedure Laterality Date  . ABDOMINAL  HYSTERECTOMY  1989  . APPENDECTOMY  1989   with hysterectomy  . BREAST EXCISIONAL BIOPSY Left 1995   Benign  . COLONOSCOPY  11-20-2000  . NOSE SURGERY  1990      Allergies: Tramadol hcl and Tramadol  Medications: Prior to Admission medications   Medication Sig Start Date End Date Taking? Authorizing Provider  acetaminophen-codeine (TYLENOL #3) 300-30 MG tablet Take 1 tablet by mouth every 6 (six) hours as needed for moderate pain. This medication contains acetaminophen. Do not take additional acetaminophen with this medication. 01/06/18  Yes Rancour, Annie Main, MD  ALPRAZolam Duanne Moron) 0.5 MG tablet TAKE ONE TABLET BY MOUTH ONCE DAILY AS NEEDED Patient taking differently: Take 0.25-0.5 mg by mouth daily as needed for anxiety. TAKE ONE TABLET BY MOUTH ONCE DAILY AS NEEDED 05/10/17  Yes Marletta Lor, MD  aspirin 81 MG tablet Take 81 mg by mouth daily.     Yes [provider]  Calcium Carbonate (CALTRATE 600) 1500 MG TABS Take by mouth.     Yes [provider]  cholecalciferol (VITAMIN D) 1000 UNITS tablet Take 1,000 Units by mouth daily.    Yes [provider]  fluticasone (CUTIVATE) 0.05 % cream Apply 1 application topically at bedtime.  08/03/12  Yes [provider]  fluticasone Asencion Islam) 50 MCG/ACT nasal spray Use 2 sprays in each nostril daily 05/11/17  Yes Marletta Lor, MD  gabapentin (NEURONTIN) 100 MG capsule Take 1 capsule (100 mg total) by mouth daily as needed (Pain). 01/11/18  Yes Isabel Caprice, MD  ibuprofen (  ADVIL,MOTRIN) 200 MG tablet Take 600 mg by mouth daily as needed for moderate pain.   Yes [provider]  levothyroxine (SYNTHROID, LEVOTHROID) 75 MCG tablet Take 1 tablet (75 mcg total) by mouth daily. 11/05/17  Yes [place], Soraya G, MD  losartan (COZAAR) 25 MG tablet Take 1 tablet (25 mg total) by mouth daily. 06/09/17 01/15/18 Yes Troy Sine, MD  Menthol-Methyl Salicylate (MUSCLE RUB) 10-15 % CREA Apply 1  application topically as needed for muscle pain.   Yes [provider]  metoprolol succinate (TOPROL-XL) 25 MG 24 hr tablet Take 1 tablet (25 mg total) by mouth daily. 09/17/17  Yes [place], Barbi G, MD  Multiple Vitamins-Minerals (MULTIVITAMIN WITH MINERALS) tablet Take 1 tablet by mouth daily.     Yes [provider]  Omega-3 Fatty Acids (FISH OIL) 1000 MG CAPS Take 2 capsules by mouth 2 (two) times daily.   Yes [provider]  omeprazole (PRILOSEC) 40 MG capsule Take 1 capsule (40 mg total) by mouth daily. 09/26/17  Yes [place], Raylene G, MD  oxyCODONE (OXY IR/ROXICODONE) 5 MG immediate release tablet Take 1 tablet (5 mg total) by mouth every 4 (four) hours as needed for severe pain. 01/11/18  Yes Precious Haws B, MD  simvastatin (ZOCOR) 20 MG tablet Take 1 tablet (20 mg total) by mouth at bedtime. 05/11/17  Yes Troy Sine, MD  vitamin C (ASCORBIC ACID) 500 MG tablet Take 500 mg by mouth 2 (two) times daily.    Yes [provider]  clobetasol cream (TEMOVATE) 0.05 % Apply twice a week first week then once weekly as needed Patient not taking: Reported on 01/15/2018 06/03/14   Terrance Mass, MD  meloxicam (MOBIC) 15 MG tablet Take 1 tablet (15 mg total) by mouth daily. Patient not taking: Reported on 01/08/2018 12/21/16   Marletta Lor, MD  ondansetron (ZOFRAN ODT) 4 MG disintegrating tablet Take 1 tablet (4 mg total) by mouth every 8 (eight) hours as needed for nausea or vomiting. 01/06/18   Ezequiel Essex, MD     Vital Signs: BP 127/65 (BP Location: Left Arm)   Pulse 70   Temp 98.2 F (36.8 C) (Oral)   Resp 16   SpO2 98%   Physical Exam awake, alert.  Chest clear to auscultation bilaterally.  Heart with regular rate and rhythm.  Abdomen slightly distended, few bowel sounds, mild diffuse tenderness palpation; puncture site from paracentesis right lower quadrant clean, dry, no hematoma.  No significant lower extremity edema.  Imaging: Ct Chest W  Contrast  Result Date: 01/15/2018 CLINICAL DATA:  Abdominal distention with no bowel movements. Radiographs demonstrated small-bowel obstruction or ileus. History of hysterectomy and appendectomy. EXAM: CT CHEST, ABDOMEN, AND PELVIS WITH CONTRAST TECHNIQUE: Multidetector CT imaging of the chest, abdomen and pelvis was performed following the standard protocol during bolus administration of intravenous contrast. CONTRAST:  163mL ISOVUE-300 IOPAMIDOL (ISOVUE-300) INJECTION 61% COMPARISON:  Radiographs of the abdomen from 01/15/2018, CT abdomen pelvis 01/06/2018 FINDINGS: CT CHEST FINDINGS Cardiovascular: Conventional branch pattern of the great vessels with mild calcific atherosclerosis. No aneurysm or dissection. The thoracic aorta is nonaneurysmal demonstrates mild atherosclerosis at the arch and along the descending portion. Heart size is top normal without pericardial effusion or thickening. No significant coronary arteriosclerosis. No large central pulmonary embolus. Mediastinum/Nodes: No enlarged mediastinal, hilar, or axillary lymph nodes. The thyroid gland is excluded on this study. Patent trachea and mainstem bronchi. Unremarkable CT appearance of the thoracic esophagus. Lungs/Pleura: Linear atelectasis and/or  scarring is noted in the left lower lobe. No acute pulmonary consolidation or dominant mass. No pneumothorax is noted. Musculoskeletal: No chest wall mass or suspicious bone lesions identified. CT ABDOMEN PELVIS FINDINGS Hepatobiliary: Homogeneous enhancement of the liver. Small left hepatic lobe cyst measuring approximately 1 cm, series 2/42. The gallbladder is distended without stones possibly from a fasting state. No mural thickening or pericholecystic fluid. Small amount perihepatic ascites is identified. Slight intrahepatic ductal dilatation may be due to fullness of the gallbladder. Pancreas: Coarse calcification noted in the body of the pancreas. No acute inflammatory process, mass or ductal  dilatation. Spleen: Normal size spleen without mass. Adrenals/Urinary Tract: Normal bilateral adrenal glands and symmetric cortical enhancement of both kidneys. Delayed imaging through both kidneys demonstrate symmetric pyelograms without obstructive uropathy. The urinary bladder is physiologically distended and unremarkable for the degree of distention. Stomach/Bowel: Small hiatal hernia is identified. The stomach is nondistended. The duodenal sweep and ligament of Treitz is unremarkable. Fluid-filled distended jejunal loops to 4.1 cm are identified with transition point suggested in the right lower quadrant, series 2, image 98. Additionally there is a short segmental area of luminal narrowing with mural thickening possibly representing small stricture or involvement by neoplasm is not excluded, series 2/89 measuring up to 1.7 cm in length with single wall thickness of up to 6 mm. The transition point appears to be adjacent to the right ovarian complex cystic mass and may be adherent as previously suggested on prior report. The patient is status post appendectomy. The colon is relatively decompressed in appearance. Vascular/Lymphatic: Patent portal and splenic veins. Moderate aortic iliac atherosclerosis without aneurysm. Lymphadenopathy identified by CT size criteria. Reproductive: Complex cystic mass with septations and possible soft tissue components along the caudal aspect is identified in the right adnexa measuring 4.9 x 4 x 4.7 cm (AP by transverse by craniocaudad). Findings are concerning for possible ovarian neoplasm. Further correlation with MRI is recommended. A more simple appearing left adnexal cyst measuring 5.3 x 3.8 x 4.5 cm is identified. The patient is status post hysterectomy. Other: Small volume of ascites is noted in the upper abdomen. There appears to be rind like thickening of the peritoneum concerning for peritoneal caking, series 2/61 in the left upper quadrant in particular concerning for  peritoneal carcinomatosis. Musculoskeletal: No acute or significant osseous findings. IMPRESSION: Chest CT: 1. No active cardiopulmonary disease. 2. Aortic atherosclerosis without aneurysm or dissection. 3. No large central pulmonary embolus. CT AP: 1. Dilated fluid-filled loops of small bowel are redemonstrated slightly more extensive than on prior exam with transition point likely in the right adnexa adjacent to a complex cystic mass concerning for ovarian neoplasm given septations and soft tissue nodularity. This raises concern for early or partial SBO. This soft tissue mass measures 4.9 x 4 x 4.7 cm and has not changed since prior recent comparison. Additional short segmental area of luminal narrowing is noted in the right lower quadrant involving small bowel for which stigmata of peritoneal carcinomatosis or small-bowel metastatic implants might account for this. 2. Redemonstration of small volume of ascites predominantly in the upper abdomen surrounding the liver and spleen. 3. Redemonstration of thick bandlike omental thickening concerning for peritoneal carcinomatosis. Electronically Signed   By: Ashley Royalty M.D.   On: 01/15/2018 19:22   Ct Abdomen Pelvis W Contrast  Result Date: 01/15/2018 CLINICAL DATA:  Abdominal distention with no bowel movements. Radiographs demonstrated small-bowel obstruction or ileus. History of hysterectomy and appendectomy. EXAM: CT CHEST, ABDOMEN, AND PELVIS WITH  CONTRAST TECHNIQUE: Multidetector CT imaging of the chest, abdomen and pelvis was performed following the standard protocol during bolus administration of intravenous contrast. CONTRAST:  114mL ISOVUE-300 IOPAMIDOL (ISOVUE-300) INJECTION 61% COMPARISON:  Radiographs of the abdomen from 01/15/2018, CT abdomen pelvis 01/06/2018 FINDINGS: CT CHEST FINDINGS Cardiovascular: Conventional branch pattern of the great vessels with mild calcific atherosclerosis. No aneurysm or dissection. The thoracic aorta is nonaneurysmal  demonstrates mild atherosclerosis at the arch and along the descending portion. Heart size is top normal without pericardial effusion or thickening. No significant coronary arteriosclerosis. No large central pulmonary embolus. Mediastinum/Nodes: No enlarged mediastinal, hilar, or axillary lymph nodes. The thyroid gland is excluded on this study. Patent trachea and mainstem bronchi. Unremarkable CT appearance of the thoracic esophagus. Lungs/Pleura: Linear atelectasis and/or scarring is noted in the left lower lobe. No acute pulmonary consolidation or dominant mass. No pneumothorax is noted. Musculoskeletal: No chest wall mass or suspicious bone lesions identified. CT ABDOMEN PELVIS FINDINGS Hepatobiliary: Homogeneous enhancement of the liver. Small left hepatic lobe cyst measuring approximately 1 cm, series 2/42. The gallbladder is distended without stones possibly from a fasting state. No mural thickening or pericholecystic fluid. Small amount perihepatic ascites is identified. Slight intrahepatic ductal dilatation may be due to fullness of the gallbladder. Pancreas: Coarse calcification noted in the body of the pancreas. No acute inflammatory process, mass or ductal dilatation. Spleen: Normal size spleen without mass. Adrenals/Urinary Tract: Normal bilateral adrenal glands and symmetric cortical enhancement of both kidneys. Delayed imaging through both kidneys demonstrate symmetric pyelograms without obstructive uropathy. The urinary bladder is physiologically distended and unremarkable for the degree of distention. Stomach/Bowel: Small hiatal hernia is identified. The stomach is nondistended. The duodenal sweep and ligament of Treitz is unremarkable. Fluid-filled distended jejunal loops to 4.1 cm are identified with transition point suggested in the right lower quadrant, series 2, image 98. Additionally there is a short segmental area of luminal narrowing with mural thickening possibly representing small  stricture or involvement by neoplasm is not excluded, series 2/89 measuring up to 1.7 cm in length with single wall thickness of up to 6 mm. The transition point appears to be adjacent to the right ovarian complex cystic mass and may be adherent as previously suggested on prior report. The patient is status post appendectomy. The colon is relatively decompressed in appearance. Vascular/Lymphatic: Patent portal and splenic veins. Moderate aortic iliac atherosclerosis without aneurysm. Lymphadenopathy identified by CT size criteria. Reproductive: Complex cystic mass with septations and possible soft tissue components along the caudal aspect is identified in the right adnexa measuring 4.9 x 4 x 4.7 cm (AP by transverse by craniocaudad). Findings are concerning for possible ovarian neoplasm. Further correlation with MRI is recommended. A more simple appearing left adnexal cyst measuring 5.3 x 3.8 x 4.5 cm is identified. The patient is status post hysterectomy. Other: Small volume of ascites is noted in the upper abdomen. There appears to be rind like thickening of the peritoneum concerning for peritoneal caking, series 2/61 in the left upper quadrant in particular concerning for peritoneal carcinomatosis. Musculoskeletal: No acute or significant osseous findings. IMPRESSION: Chest CT: 1. No active cardiopulmonary disease. 2. Aortic atherosclerosis without aneurysm or dissection. 3. No large central pulmonary embolus. CT AP: 1. Dilated fluid-filled loops of small bowel are redemonstrated slightly more extensive than on prior exam with transition point likely in the right adnexa adjacent to a complex cystic mass concerning for ovarian neoplasm given septations and soft tissue nodularity. This raises concern for early or partial  SBO. This soft tissue mass measures 4.9 x 4 x 4.7 cm and has not changed since prior recent comparison. Additional short segmental area of luminal narrowing is noted in the right lower quadrant  involving small bowel for which stigmata of peritoneal carcinomatosis or small-bowel metastatic implants might account for this. 2. Redemonstration of small volume of ascites predominantly in the upper abdomen surrounding the liver and spleen. 3. Redemonstration of thick bandlike omental thickening concerning for peritoneal carcinomatosis. Electronically Signed   By: Ashley Royalty M.D.   On: 01/15/2018 19:22   US Paracentesis  Result Date: 01/15/2018 INDICATION: Patient with history of abdominal pain, complex right adnexal cystic mass, elevated CA-125, omental nodularity, ascites. Request made for diagnostic and therapeutic paracentesis. EXAM: ULTRASOUND GUIDED DIAGNOSTIC AND THERAPEUTIC PARACENTESIS MEDICATIONS: None COMPLICATIONS: None immediate. PROCEDURE: Informed written consent was obtained from the patient after a discussion of the risks, benefits and alternatives to treatment. A timeout was performed prior to the initiation of the procedure. Initial ultrasound scanning demonstrates a small amount of ascites within the right lower abdominal quadrant. The right lower abdomen was prepped and draped in the usual sterile fashion. 1% lidocaine was used for local anesthesia. Following this, a 19 gauge, 10-cm, Yueh catheter was introduced. An ultrasound image was saved for documentation purposes. The paracentesis was performed. The catheter was removed and a dressing was applied. The patient tolerated the procedure well without immediate post procedural complication. FINDINGS: A total of approximately 1.5 liters of hazy, yellow fluid was removed. Samples were sent to the laboratory as requested by the clinical team. IMPRESSION: Successful ultrasound-guided diagnostic and therapeutic paracentesis yielding 1.5 liters of peritoneal fluid. Read by: Rowe Robert, PA-C Electronically Signed   By: Jacqulynn Cadet M.D.   On: 01/15/2018 14:43   Dg Abd 2 Views  Result Date: 01/15/2018 CLINICAL DATA:  Acute generalized  abdominal pain and distention. History of ovarian cancer. EXAM: ABDOMEN - 2 VIEW COMPARISON:  CT scan of January 06, 2018. FINDINGS: Mildly dilated small bowel loops are noted concerning for distal small bowel obstruction or ileus. No colonic dilatation is noted. There is no evidence of free air. No radio-opaque calculi or other significant radiographic abnormality is seen. IMPRESSION: Mildly dilated small bowel loops are noted concerning for distal small bowel obstruction or possibly ileus. Electronically Signed   By: Marijo Conception, M.D.   On: 01/15/2018 14:25    Labs:  CBC: Recent Labs    01/06/18 0124 01/15/18 1532 01/16/18 0051 01/16/18 0515  WBC 13.1* 9.0 8.4 6.0  HGB 13.2 11.5* 12.4 10.8*  HCT 42.1 35.5* 38.8 34.4*  PLT 336 387 433* 405*    COAGS: No results for input(s): INR, APTT in the last 8760 hours.  BMP: Recent Labs    11/03/17 1019 01/06/18 0124 01/15/18 1532 01/16/18 0051 01/16/18 0515  NA 140 138 134*  --  135  K 4.1 3.4* 3.4*  --  3.8  CL 104 104 100  --  105  CO2 29 27 24   --  22  GLUCOSE 82 127* 106*  --  84  BUN 11 7* 10  --  10  CALCIUM 9.6 9.1 8.4*  --  7.8*  CREATININE 0.69 0.64 0.54 0.54 0.59  GFRNONAA  --  >60 >60 >60 >60  GFRAA  --  >60 >60 >60 >60    LIVER FUNCTION TESTS: Recent Labs    11/03/17 1019 01/06/18 0124 01/15/18 1532 01/16/18 0515  BILITOT 0.4 0.5 0.3 0.2*  AST  16 23 40 24  ALT 13 16 56* 39  ALKPHOS 84 79 208* 156*  PROT 7.3 7.7 6.4* 5.6*  ALBUMIN 4.2 3.7 2.8* 2.3*    Assessment and Plan: Pt with history of abdominal/pelvic/back pain, markedly elevated CA 125 and recent imaging revealing complex cystic mass at the right adnexa concerning for primary ovarian malignancy along with diffuse nodularity along the omentum concerning for peritoneal carcinomatosis and ascites; status post paracentesis yesterday yielding 1.5 L ,cytology pending.  She was admitted to [facility] yesterday with persistent  abdominal/pelvic/back pain and findings of dilated fluid-filled loops of small bowel more extensive than on prior exam with transition point likely in the right adnexa adjacent to the complex cystic mass and concerning for partial small bowel obstruction.  She was on outpatient IR schedule tomorrow for omental mass biopsy.  Request now received to perform biopsy tomorrow as inpatient.  Imaging studies have been reviewed by Dr. Kathlene Cote.  Recommend serial abdominal films to ensure improvement in small bowel dilatation prior to performing biopsy.  Details/risks of procedure (including possible repeat paracentesis), including but not limited to, internal bleeding, infection, injury to adjacent structures discussed with patient with her understanding and consent.  We will tentatively place on schedule for tomorrow pending clinical status reeval/abdominal film improvement.  Hold Lovenox until after above procedure.   Electronically Signed: D. Rowe Robert, PA-C 01/16/2018, 11:28 AM   I spent a total of 25 minutes at the the patient's bedside AND on the patient's hospital floor or unit, greater than 50% of which was counseling/coordinating care for image guided omental mass/ thickening biopsy, possible paracentesis    Patient ID: Haley [name], female   DOB: Sep 10, 1945, 73 y.o.   MRN: [id number]

## 2018-01-16 NOTE — Telephone Encounter (Signed)
Patient went to [facility] on 01/15/2018 and was admitted.

## 2018-01-16 NOTE — Progress Notes (Signed)
1534/1534-01  Past Medical History:  Diagnosis Date  . Allergy    SEASONAL  . Cataract    BILATERAL  . Fibromyalgia   . GERD (gastroesophageal reflux disease)   . H/O echocardiogram 04/28/08   EF>55% trace mitral regurgitation, No significant valvular pathology  . Hemorrhoids    internal  . History of stress test 02/08/2011   Normal Myocardial perfusion study, this is a low risk scan, No prior study available for comparison  . Hyperlipidemia   . MVP (mitral valve prolapse)    mild and MR  . OA (osteoarthritis)   . Osteoporosis   . Paroxysmal A-fib ([place])   . Sleep apnea    does not use prescribed CPAP  . Thyroid disease    HYPO  . Varicosities of leg   . Vitamin D deficiency      Subjective: Patient reports yesterday she felt distended and was having pain due to this. Denied emesis. Had BM yesterday "mushy". Passing flatus. Felt much better after paracentesis. Cytology is still pending. Gram stain on fluid shows no organisms; no growth to date. Tells me pain is diffuse across her tummy. She has had another BM since admission and passing flatus. She is worried about the fluid/pain returning. We had her IR biopsy scheduled for tomorrow.   Objective: Vital signs in last 24 hours: Temp:  [98.1 F (36.7 C)-98.9 F (37.2 C)] 98.2 F (36.8 C) (01/07 0621) Pulse Rate:  [61-85] 70 (01/07 0621) Resp:  [16-19] 16 (01/07 0621) BP: (112-145)/(60-80) 127/65 (01/07 0621) SpO2:  [94 %-100 %] 98 % (01/07 0621) Last BM Date: 01/15/18  Intake/Output from previous day: 01/06 0701 - 01/07 0700 In: 231.1 [I.V.:31.1; IV Piggyback:200] Out: -  No data found.   Physical Examination: General: alert and no distress Resp: clear to auscultation bilaterally Cardio: regular rate and rhythm GI: Mild distension. Hyperactive BS all quadrants. Some tinkling in RLQ. NT, no rebound. Extremities: extremities normal, atraumatic, no cyanosis or edema  Labs: WBC/Hgb/Hct/Plts:   6.0/10.8/34.4/405 (01/07 0515) BUN/Cr/glu/ALT/AST/amyl/lip:  10/0.59/--/39/24/--/-- (01/07 0515)  CBC Latest Ref Rng & Units 01/16/2018 01/16/2018 01/15/2018  WBC 4.0 - 10.5 K/uL 6.0 8.4 9.0  Hemoglobin 12.0 - 15.0 g/dL 10.8(L) 12.4 11.5(L)  Hematocrit 36.0 - 46.0 % 34.4(L) 38.8 35.5(L)  Platelets 150 - 400 K/uL 405(H) 433(H) 387   BMP Latest Ref Rng & Units 01/16/2018 01/16/2018 01/15/2018  Glucose 70 - 99 mg/dL 84 - 106(H)  BUN 8 - 23 mg/dL 10 - 10  Creatinine 0.44 - 1.00 mg/dL 0.59 0.54 0.54  Sodium 135 - 145 mmol/L 135 - 134(L)  Potassium 3.5 - 5.1 mmol/L 3.8 - 3.4(L)  Chloride 98 - 111 mmol/L 105 - 100  CO2 22 - 32 mmol/L 22 - 24  Calcium 8.9 - 10.3 mg/dL 7.8(L) - 8.4(L)      Assessment/Plan:  73 y.o. s/p : stable  Pain:  Pain is controlled on IV and/or oral medications.  Neuro: no issues  Pulm: no issues; note recent chest CT clear.  Heme: mild anemia  CV:  No current issues   Prophylaxis: agree with pharmacologic prophylaxis. Marland KitchenHold per protocol for IR biopsy tomorrow.  GI:     Tolerating oral diet with bowel function demonstrated.  Agree with surgery she may have a partial obstruction  Will monitor today and try oral challenge  GU: . No current issues. No outs on flow.  FEN:  . IVF maintain low rate for hydration given decreased oral intake . Elec monitor .  Diet sips clears today  Endo: no issues.   ID: cultures negative thus far  Onc This is all related to tumor effects. The paracentesis helped her with her discomfort. We will get her biopsy tomorrow and are planning neoadjuvant chemotherapy. Cytology is still pending. She may need another paracentesis before we can get her chemotherapy started.    LOS: 0 days    Isabel Caprice 01/16/2018, 9:24 AM

## 2018-01-16 NOTE — H&P (Signed)
H&P        History and Physical    Haley [name] [facility] OFB:[id number] DOB: 14-Mar-1945 DOA: 01/15/2018  PCP: Haley [name], Haley G, MD  Patient coming from: radiology  I have personally briefly reviewed patient's old medical records in [place]  Chief Complaint: abd pain   HPI: Haley [name] is a 73 y.o. female with medical history significant of recent dx ovarian Ca presents with abdominal pain.  Patient was seen in any pain in late December.  She had a CT scan concerning for very malignancy and peritoneal carcinomatosis.  She is follow-up with OB/GYN outpatient.  She came in and had a paracentesis done.  She has been in lots of pain despite her pain medication at home.  Referred over to the ED.  Patient's fluid from abdomen showed 1700 white cells.  She was afebrile here and has been hemodynamically stable.  Patient has not started treatment as of yet.  But she states she is supposed to have a parous a biopsy this week and to start treatment with Dr. Arnette [name] from [facility].   ED Course: Patient was treated for presumed SBP with cefepime IV.  Also given some pain medications.  Patient had a CT scan of her chest that was nonacute.  CT of her abdomen and pelvis showed dilated fluid filled loops of small bowel with transition point likely in the right adnexa adjacent to complex cystic mass.  As well as changes suggesting peritoneal carcinomatosis versus small bowel metastatic implants  Review of Systems: Positive for constipation abdominal discomfort some nausea no vomiting  All others reviewed with patient  and are  negative unless otherwise stated   Past Medical History:  Diagnosis Date  . Allergy    SEASONAL  . Cataract    BILATERAL  . Fibromyalgia   . GERD (gastroesophageal reflux disease)   . H/O echocardiogram 04/28/08   EF>55% trace mitral regurgitation, No significant valvular pathology  . Hemorrhoids    internal  . History of stress test 02/08/2011   Normal Myocardial perfusion  study, this is a low risk scan, No prior study available for comparison  . Hyperlipidemia   . MVP (mitral valve prolapse)    mild and MR  . OA (osteoarthritis)   . Osteoporosis   . Paroxysmal A-fib (Haley [name])   . Sleep apnea    does not use prescribed CPAP  . Thyroid disease    HYPO  . Varicosities of leg   . Vitamin D deficiency     Past Surgical History:  Procedure Laterality Date  . ABDOMINAL HYSTERECTOMY  1989  . APPENDECTOMY  1989   with hysterectomy  . BREAST EXCISIONAL BIOPSY Left 1995   Benign  . COLONOSCOPY  11-20-2000  . NOSE SURGERY  1990     reports that she has been smoking cigars. She has a 5.00 pack-year smoking history. She has never used smokeless tobacco. She reports that she does not drink alcohol or use drugs.  Allergies  Allergen Reactions  . Tramadol Hcl     REACTION: jittery  . Tramadol Nausea And Vomiting    Family History  Problem Relation Age of Onset  . Diabetes Daughter   . Diabetes Mother   . Hypertension Mother   . Heart disease Mother   . Stroke Maternal Grandmother   . Cancer Niece        unsure breast or lung  . Colon cancer Neg Hx      Prior to Admission  medications   Medication Sig Start Date End Date Taking? Authorizing Provider  acetaminophen-codeine (TYLENOL #3) 300-30 MG tablet Take 1 tablet by mouth every 6 (six) hours as needed for moderate pain. This medication contains acetaminophen. Do not take additional acetaminophen with this medication. 01/06/18  Yes Rancour, Haley Main, MD  ALPRAZolam Duanne Moron) 0.5 MG tablet TAKE ONE TABLET BY MOUTH ONCE DAILY AS NEEDED Patient taking differently: Take 0.25-0.5 mg by mouth daily as needed for anxiety. TAKE ONE TABLET BY MOUTH ONCE DAILY AS NEEDED 05/10/17  Yes Haley Lor, MD  aspirin 81 MG tablet Take 81 mg by mouth daily.     Yes [provider]  Calcium Carbonate (CALTRATE 600) 1500 MG TABS Take by mouth.     Yes [provider]  cholecalciferol (VITAMIN D) 1000  UNITS tablet Take 1,000 Units by mouth daily.    Yes [provider]  fluticasone (CUTIVATE) 0.05 % cream Apply 1 application topically at bedtime.  08/03/12  Yes [provider]  fluticasone Asencion Islam) 50 MCG/ACT nasal spray Use 2 sprays in each nostril daily 05/11/17  Yes Haley Lor, MD  gabapentin (NEURONTIN) 100 MG capsule Take 1 capsule (100 mg total) by mouth daily as needed (Pain). 01/11/18  Yes Haley Haws B, MD  ibuprofen (ADVIL,MOTRIN) 200 MG tablet Take 600 mg by mouth daily as needed for moderate pain.   Yes [provider]  levothyroxine (SYNTHROID, LEVOTHROID) 75 MCG tablet Take 1 tablet (75 mcg total) by mouth daily. 11/05/17  Yes Haley [name], Haley G, MD  losartan (COZAAR) 25 MG tablet Take 1 tablet (25 mg total) by mouth daily. 06/09/17 01/15/18 Yes Haley Sine, MD  Menthol-Methyl Salicylate (MUSCLE RUB) 10-15 % CREA Apply 1 application topically as needed for muscle pain.   Yes [provider]  metoprolol succinate (TOPROL-XL) 25 MG 24 hr tablet Take 1 tablet (25 mg total) by mouth daily. 09/17/17  Yes Haley [name], Haley G, MD  Multiple Vitamins-Minerals (MULTIVITAMIN WITH MINERALS) tablet Take 1 tablet by mouth daily.     Yes [provider]  Omega-3 Fatty Acids (FISH OIL) 1000 MG CAPS Take 2 capsules by mouth 2 (two) times daily.   Yes [provider]  omeprazole (PRILOSEC) 40 MG capsule Take 1 capsule (40 mg total) by mouth daily. 09/26/17  Yes Haley [name], Haley G, MD  oxyCODONE (OXY IR/ROXICODONE) 5 MG immediate release tablet Take 1 tablet (5 mg total) by mouth every 4 (four) hours as needed for severe pain. 01/11/18  Yes Haley Haws B, MD  simvastatin (ZOCOR) 20 MG tablet Take 1 tablet (20 mg total) by mouth at bedtime. 05/11/17  Yes Haley Sine, MD  vitamin C (ASCORBIC ACID) 500 MG tablet Take 500 mg by mouth 2 (two) times daily.    Yes [provider]  clobetasol cream (TEMOVATE) 0.05 % Apply twice a week first week  then once weekly as needed Patient not taking: Reported on 01/15/2018 06/03/14   Terrance Mass, MD  meloxicam (MOBIC) 15 MG tablet Take 1 tablet (15 mg total) by mouth daily. Patient not taking: Reported on 01/08/2018 12/21/16   Haley Lor, MD  ondansetron (ZOFRAN ODT) 4 MG disintegrating tablet Take 1 tablet (4 mg total) by mouth every 8 (eight) hours as needed for nausea or vomiting. 01/06/18   Ezequiel Essex, MD    Physical Exam: Vitals:   01/16/18 0000 01/16/18 0003 01/16/18 0030 01/16/18 0100  BP: 112/62 112/62 132/72 130/66  Pulse: 65 65 64  72  Resp:  18    Temp:      TempSrc:      SpO2: 95% 96% 94% 98%    Constitutional: NAD, mildly uncomfortable Vitals:   01/16/18 0000 01/16/18 0003 01/16/18 0030 01/16/18 0100  BP: 112/62 112/62 132/72 130/66  Pulse: 65 65 64 72  Resp:  18    Temp:      TempSrc:      SpO2: 95% 96% 94% 98%   Eyes: PERRL, lids and conjunctivae normal ENMT: Mucous membranes are dry . Posterior pharynx clear of any exudate or lesions.  Neck: normal, supple,  Respiratory: clear to auscultation bilaterally, no wheezing, no crackles. Normal respiratory effort. No accessory muscle use.  Cardiovascular: Regular rate and rhythm, no murmurs . No extremity edema. 2+ pedal pulses.  Abdomen: Soft mildly distended mild generalized tenderness no tenderness, no masses palpated. . Bowel sounds positive.  Musculoskeletal: no clubbing / cyanosis. No joint deformity upper and lower extremities. Good ROM, no contractures. Normal muscle tone.  Skin: no rashes, lesions, ulcers. No induration Neurologic: CN 2-12 grossly intact.  Moves all extremities equally Psychiatric: Normal judgment and insight. Alert and oriented x 3. Normal mood.    Labs on Admission: I have personally reviewed following labs and imaging studies  CBC: Recent Labs  Lab 01/15/18 1532  WBC 9.0  HGB 11.5*  HCT 35.5*  MCV 89.0  PLT 347   Basic Metabolic Panel: Recent Labs  Lab  01/15/18 1532  NA 134*  K 3.4*  CL 100  CO2 24  GLUCOSE 106*  BUN 10  CREATININE 0.54  CALCIUM 8.4*   GFR: Estimated Creatinine Clearance: 55.8 mL/min (by C-[name] formula based on SCr of 0.54 mg/dL). Liver Function Tests: Recent Labs  Lab 01/15/18 1532  AST 40  ALT 56*  ALKPHOS 208*  BILITOT 0.3  PROT 6.4*  ALBUMIN 2.8*   Recent Labs  Lab 01/15/18 1532  LIPASE 30   No results for input(s): AMMONIA in the last 168 hours. Coagulation Profile: No results for input(s): INR, PROTIME in the last 168 hours. Cardiac Enzymes: No results for input(s): CKTOTAL, CKMB, CKMBINDEX, TROPONINI in the last 168 hours. BNP (last 3 results) No results for input(s): PROBNP in the last 8760 hours. HbA1C: No results for input(s): HGBA1C in the last 72 hours. CBG: No results for input(s): GLUCAP in the last 168 hours. Lipid Profile: No results for input(s): CHOL, HDL, LDLCALC, TRIG, CHOLHDL, LDLDIRECT in the last 72 hours. Thyroid Function Tests: No results for input(s): TSH, T4TOTAL, FREET4, T3FREE, THYROIDAB in the last 72 hours. Anemia Panel: No results for input(s): VITAMINB12, FOLATE, FERRITIN, TIBC, IRON, RETICCTPCT in the last 72 hours. Urine analysis:    Component Value Date/Time   COLORURINE STRAW (A) 01/15/2018 1555   APPEARANCEUR CLEAR 01/15/2018 1555   LABSPEC 1.002 (L) 01/15/2018 1555   PHURINE 6.0 01/15/2018 1555   GLUCOSEU NEGATIVE 01/15/2018 1555   HGBUR SMALL (A) 01/15/2018 1555   HGBUR trace-intact 09/10/2009 0831   BILIRUBINUR NEGATIVE 01/15/2018 1555   BILIRUBINUR neg 12/13/2017 1029   KETONESUR NEGATIVE 01/15/2018 1555   PROTEINUR NEGATIVE 01/15/2018 1555   UROBILINOGEN 0.2 12/13/2017 1029   UROBILINOGEN 0.2 09/10/2009 0831   NITRITE NEGATIVE 01/15/2018 1555   LEUKOCYTESUR NEGATIVE 01/15/2018 1555    Radiological Exams on Admission: Ct Chest W Contrast  Result Date: 01/15/2018 CLINICAL DATA:  Abdominal distention with no bowel movements. Radiographs  demonstrated small-bowel obstruction or ileus. History of hysterectomy and appendectomy. EXAM: CT CHEST,  ABDOMEN, AND PELVIS WITH CONTRAST TECHNIQUE: Multidetector CT imaging of the chest, abdomen and pelvis was performed following the standard protocol during bolus administration of intravenous contrast. CONTRAST:  115mL ISOVUE-300 IOPAMIDOL (ISOVUE-300) INJECTION 61% COMPARISON:  Radiographs of the abdomen from 01/15/2018, CT abdomen pelvis 01/06/2018 FINDINGS: CT CHEST FINDINGS Cardiovascular: Conventional branch pattern of the great vessels with mild calcific atherosclerosis. No aneurysm or dissection. The thoracic aorta is nonaneurysmal demonstrates mild atherosclerosis at the arch and along the descending portion. Heart size is top normal without pericardial effusion or thickening. No significant coronary arteriosclerosis. No large central pulmonary embolus. Mediastinum/Nodes: No enlarged mediastinal, hilar, or axillary lymph nodes. The thyroid gland is excluded on this study. Patent trachea and mainstem bronchi. Unremarkable CT appearance of the thoracic esophagus. Lungs/Pleura: Linear atelectasis and/or scarring is noted in the left lower lobe. No acute pulmonary consolidation or dominant mass. No pneumothorax is noted. Musculoskeletal: No chest wall mass or suspicious bone lesions identified. CT ABDOMEN PELVIS FINDINGS Hepatobiliary: Homogeneous enhancement of the liver. Small left hepatic lobe cyst measuring approximately 1 cm, series 2/42. The gallbladder is distended without stones possibly from a fasting state. No mural thickening or pericholecystic fluid. Small amount perihepatic ascites is identified. Slight intrahepatic ductal dilatation may be due to fullness of the gallbladder. Pancreas: Coarse calcification noted in the body of the pancreas. No acute inflammatory process, mass or ductal dilatation. Spleen: Normal size spleen without mass. Adrenals/Urinary Tract: Normal bilateral adrenal glands and  symmetric cortical enhancement of both kidneys. Delayed imaging through both kidneys demonstrate symmetric pyelograms without obstructive uropathy. The urinary bladder is physiologically distended and unremarkable for the degree of distention. Stomach/Bowel: Small hiatal hernia is identified. The stomach is nondistended. The duodenal sweep and ligament of Treitz is unremarkable. Fluid-filled distended jejunal loops to 4.1 cm are identified with transition point suggested in the right lower quadrant, series 2, image 98. Additionally there is a short segmental area of luminal narrowing with mural thickening possibly representing small stricture or involvement by neoplasm is not excluded, series 2/89 measuring up to 1.7 cm in length with single wall thickness of up to 6 mm. The transition point appears to be adjacent to the right ovarian complex cystic mass and may be adherent as previously suggested on prior report. The patient is status post appendectomy. The colon is relatively decompressed in appearance. Vascular/Lymphatic: Patent portal and splenic veins. Moderate aortic iliac atherosclerosis without aneurysm. Lymphadenopathy identified by CT size criteria. Reproductive: Complex cystic mass with septations and possible soft tissue components along the caudal aspect is identified in the right adnexa measuring 4.9 x 4 x 4.7 cm (AP by transverse by craniocaudad). Findings are concerning for possible ovarian neoplasm. Further correlation with MRI is recommended. A more simple appearing left adnexal cyst measuring 5.3 x 3.8 x 4.5 cm is identified. The patient is status post hysterectomy. Other: Small volume of ascites is noted in the upper abdomen. There appears to be rind like thickening of the peritoneum concerning for peritoneal caking, series 2/61 in the left upper quadrant in particular concerning for peritoneal carcinomatosis. Musculoskeletal: No acute or significant osseous findings. IMPRESSION: Chest CT: 1. No  active cardiopulmonary disease. 2. Aortic atherosclerosis without aneurysm or dissection. 3. No large central pulmonary embolus. CT AP: 1. Dilated fluid-filled loops of small bowel are redemonstrated slightly more extensive than on prior exam with transition point likely in the right adnexa adjacent to a complex cystic mass concerning for ovarian neoplasm given septations and soft tissue nodularity. This raises concern  for early or partial SBO. This soft tissue mass measures 4.9 x 4 x 4.7 cm and has not changed since prior recent comparison. Additional short segmental area of luminal narrowing is noted in the right lower quadrant involving small bowel for which stigmata of peritoneal carcinomatosis or small-bowel metastatic implants might account for this. 2. Redemonstration of small volume of ascites predominantly in the upper abdomen surrounding the liver and spleen. 3. Redemonstration of thick bandlike omental thickening concerning for peritoneal carcinomatosis. Electronically Signed   By: Ashley Royalty M.D.   On: 01/15/2018 19:22   Ct Abdomen Pelvis W Contrast  Result Date: 01/15/2018 CLINICAL DATA:  Abdominal distention with no bowel movements. Radiographs demonstrated small-bowel obstruction or ileus. History of hysterectomy and appendectomy. EXAM: CT CHEST, ABDOMEN, AND PELVIS WITH CONTRAST TECHNIQUE: Multidetector CT imaging of the chest, abdomen and pelvis was performed following the standard protocol during bolus administration of intravenous contrast. CONTRAST:  116mL ISOVUE-300 IOPAMIDOL (ISOVUE-300) INJECTION 61% COMPARISON:  Radiographs of the abdomen from 01/15/2018, CT abdomen pelvis 01/06/2018 FINDINGS: CT CHEST FINDINGS Cardiovascular: Conventional branch pattern of the great vessels with mild calcific atherosclerosis. No aneurysm or dissection. The thoracic aorta is nonaneurysmal demonstrates mild atherosclerosis at the arch and along the descending portion. Heart size is top normal without  pericardial effusion or thickening. No significant coronary arteriosclerosis. No large central pulmonary embolus. Mediastinum/Nodes: No enlarged mediastinal, hilar, or axillary lymph nodes. The thyroid gland is excluded on this study. Patent trachea and mainstem bronchi. Unremarkable CT appearance of the thoracic esophagus. Lungs/Pleura: Linear atelectasis and/or scarring is noted in the left lower lobe. No acute pulmonary consolidation or dominant mass. No pneumothorax is noted. Musculoskeletal: No chest wall mass or suspicious bone lesions identified. CT ABDOMEN PELVIS FINDINGS Hepatobiliary: Homogeneous enhancement of the liver. Small left hepatic lobe cyst measuring approximately 1 cm, series 2/42. The gallbladder is distended without stones possibly from a fasting state. No mural thickening or pericholecystic fluid. Small amount perihepatic ascites is identified. Slight intrahepatic ductal dilatation may be due to fullness of the gallbladder. Pancreas: Coarse calcification noted in the body of the pancreas. No acute inflammatory process, mass or ductal dilatation. Spleen: Normal size spleen without mass. Adrenals/Urinary Tract: Normal bilateral adrenal glands and symmetric cortical enhancement of both kidneys. Delayed imaging through both kidneys demonstrate symmetric pyelograms without obstructive uropathy. The urinary bladder is physiologically distended and unremarkable for the degree of distention. Stomach/Bowel: Small hiatal hernia is identified. The stomach is nondistended. The duodenal sweep and ligament of Treitz is unremarkable. Fluid-filled distended jejunal loops to 4.1 cm are identified with transition point suggested in the right lower quadrant, series 2, image 98. Additionally there is a short segmental area of luminal narrowing with mural thickening possibly representing small stricture or involvement by neoplasm is not excluded, series 2/89 measuring up to 1.7 cm in length with single wall  thickness of up to 6 mm. The transition point appears to be adjacent to the right ovarian complex cystic mass and may be adherent as previously suggested on prior report. The patient is status post appendectomy. The colon is relatively decompressed in appearance. Vascular/Lymphatic: Patent portal and splenic veins. Moderate aortic iliac atherosclerosis without aneurysm. Lymphadenopathy identified by CT size criteria. Reproductive: Complex cystic mass with septations and possible soft tissue components along the caudal aspect is identified in the right adnexa measuring 4.9 x 4 x 4.7 cm (AP by transverse by craniocaudad). Findings are concerning for possible ovarian neoplasm. Further correlation with MRI is recommended. A more  simple appearing left adnexal cyst measuring 5.3 x 3.8 x 4.5 cm is identified. The patient is status post hysterectomy. Other: Small volume of ascites is noted in the upper abdomen. There appears to be rind like thickening of the peritoneum concerning for peritoneal caking, series 2/61 in the left upper quadrant in particular concerning for peritoneal carcinomatosis. Musculoskeletal: No acute or significant osseous findings. IMPRESSION: Chest CT: 1. No active cardiopulmonary disease. 2. Aortic atherosclerosis without aneurysm or dissection. 3. No large central pulmonary embolus. CT AP: 1. Dilated fluid-filled loops of small bowel are redemonstrated slightly more extensive than on prior exam with transition point likely in the right adnexa adjacent to a complex cystic mass concerning for ovarian neoplasm given septations and soft tissue nodularity. This raises concern for early or partial SBO. This soft tissue mass measures 4.9 x 4 x 4.7 cm and has not changed since prior recent comparison. Additional short segmental area of luminal narrowing is noted in the right lower quadrant involving small bowel for which stigmata of peritoneal carcinomatosis or small-bowel metastatic implants might account  for this. 2. Redemonstration of small volume of ascites predominantly in the upper abdomen surrounding the liver and spleen. 3. Redemonstration of thick bandlike omental thickening concerning for peritoneal carcinomatosis. Electronically Signed   By: Ashley Royalty M.D.   On: 01/15/2018 19:22   US Paracentesis  Result Date: 01/15/2018 INDICATION: Patient with history of abdominal pain, complex right adnexal cystic mass, elevated CA-125, omental nodularity, ascites. Request made for diagnostic and therapeutic paracentesis. EXAM: ULTRASOUND GUIDED DIAGNOSTIC AND THERAPEUTIC PARACENTESIS MEDICATIONS: None COMPLICATIONS: None immediate. PROCEDURE: Informed written consent was obtained from the patient after a discussion of the risks, benefits and alternatives to treatment. A timeout was performed prior to the initiation of the procedure. Initial ultrasound scanning demonstrates a small amount of ascites within the right lower abdominal quadrant. The right lower abdomen was prepped and draped in the usual sterile fashion. 1% lidocaine was used for local anesthesia. Following this, a 19 gauge, 10-cm, Yueh catheter was introduced. An ultrasound image was saved for documentation purposes. The paracentesis was performed. The catheter was removed and a dressing was applied. The patient tolerated the procedure well without immediate post procedural complication. FINDINGS: A total of approximately 1.5 liters of hazy, yellow fluid was removed. Samples were sent to the laboratory as requested by the clinical team. IMPRESSION: Successful ultrasound-guided diagnostic and therapeutic paracentesis yielding 1.5 liters of peritoneal fluid. Read by: Rowe Robert, PA-C Electronically Signed   By: Jacqulynn Cadet M.D.   On: 01/15/2018 14:43   Dg Abd 2 Views  Result Date: 01/15/2018 CLINICAL DATA:  Acute generalized abdominal pain and distention. History of ovarian cancer. EXAM: ABDOMEN - 2 VIEW COMPARISON:  CT scan of January 06, 2018. FINDINGS: Mildly dilated small bowel loops are noted concerning for distal small bowel obstruction or ileus. No colonic dilatation is noted. There is no evidence of free air. No radio-opaque calculi or other significant radiographic abnormality is seen. IMPRESSION: Mildly dilated small bowel loops are noted concerning for distal small bowel obstruction or possibly ileus. Electronically Signed   By: Marijo Conception, M.D.   On: 01/15/2018 14:25      Assessment/Plan Principal Problem:   Abdominal pain Active Problems:   Hypothyroidism   GERD   Carcinomatosis ([place])   Ovarian cancer ([place]) suspected   SBO (small bowel obstruction) ([facility]) partial vs early on CT   -Observation admission to medical bed, IV Rocephin, follow cultures ,supportive care.  Questionable early versus partial small bowel obstruction on CT scan due to ovarian mass.  Will treat patient with sips and chips for now, advance diet as tolerated.  ED APC spoke with gen  surgery who did not offer any acute intervention at this time. -Consider gynecology/onc  consultation while here -Continue home medications for hypothyroidism and GERD.  DVT prophylaxis: Lovenox Code Status: full Disposition Plan: home 2 days  Admission status: Obs med    Shelbie Proctor MD [facility] Pager [phone]  If 7PM-7AM, please contact night-coverage [email or web address] Password [email or web address]  01/16/2018, 1:45 AM

## 2018-01-16 NOTE — Progress Notes (Signed)
  PROGRESS NOTE  Haley [name] JJH:[id number] DOB: 01/07/1946 DOA: 01/15/2018 PCP: [place], Deazia G, MD  Brief History   73 year old woman diagnosed with ovarian cancer December 2019 presented with abdominal pain, admitted for SBO, possible SBP.  A & P  Partial small bowel obstruction secondary to metastatic ovarian cancer with peritoneal carcinomatosis --management as per general surgery, GYN oncology  Question of SBP on admission --Gout at this point, initial studies not highly suggestive.  Follow-up culture data.  For now we will continue ceftriaxone.  Hypothyroidism.   --Continue levothyroxine.     DVT prophylaxis: SCDs Code Status: Full Family Communication: none Disposition Plan: home    Murray Hodgkins, MD  [facility] Direct contact: [phone] --Via amion app OR  --[email or web address]; password [email or web address]  7PM-7AM contact night coverage as above 01/16/2018, 2:39 PM  LOS: 0 days   Consultants  . IR . General surgery . GYN oncology  Procedures  . 1/6 diagnostic and therapeutic paracentesis 1.5 L hazy yellow fluid  Antibiotics  .   Interval History/Subjective  Has abdominal pain, otherwise doing okay.  Objective   Vitals:  Vitals:   01/16/18 0621 01/16/18 1353  BP: 127/65 (!) 104/58  Pulse: 70 (!) 57  Resp: 16 18  Temp: 98.2 F (36.8 C) 98 F (36.7 C)  SpO2: 98% 96%    Exam:  Constitutional:  . Appears calm, uncomfortable but not toxic. Eyes:  . pupils and irises appear normal ENMT:  . grossly normal hearing  Respiratory:  . CTA bilaterally, no w/r/r.  . Respiratory effort normal. Cardiovascular:  . RRR, no m/r/g . No LE extremity edema   Abdomen:  . Soft nondistended, mild generalized tenderness Psychiatric:  . Mental status o Mood, affect appropriate  I have personally reviewed the following:   Today's Data  . Complete metabolic panel unremarkable . Hemoglobin 10.8, remainder CBC unremarkable  Lab Data  .   Micro Data  . Urine  culture, blood cultures pending  Imaging  . CT chest no active disease . CT abdomen pelvis noted showed dilated fluid-filled loops of small bowel concerning for early or partial SBO.  Cardiology Data  .   Other Data  .   Scheduled Meds: . fluticasone  2 spray Each Nare Daily  . levothyroxine  75 mcg Oral Daily  . losartan  25 mg Oral Daily  . metoprolol succinate  25 mg Oral Daily  . pantoprazole  40 mg Oral Daily  . senna  1 tablet Oral BID  . simvastatin  20 mg Oral QHS   Continuous Infusions: . [START ON 01/17/2018] sodium chloride    . sodium chloride 75 mL/hr at 01/16/18 0532  . cefTRIAXone (ROCEPHIN)  IV 2 g (01/16/18 0210)    Principal Problem:   Abdominal pain Active Problems:   Hypothyroidism   GERD   Carcinomatosis ([place])   Ovarian cancer ([place]) suspected   SBO (small bowel obstruction) ([facility]) partial vs early on CT   LOS: 0 days

## 2018-01-16 NOTE — Progress Notes (Signed)
Gynecologic Oncology  Subjective: Patient reports improvement in abdominal pain this am. Bowels moved this am (semi-soft) and passing flatus.  No nausea or emesis reported.  Denies chest pain, dyspnea. Husband at the bedside. No concerns voiced.  Objective: Vital signs in last 24 hours: Temp:  [98.1 F (36.7 C)-98.9 F (37.2 C)] 98.2 F (36.8 C) (01/07 0621) Pulse Rate:  [61-85] 70 (01/07 0621) Resp:  [16-19] 16 (01/07 0621) BP: (112-145)/(60-80) 127/65 (01/07 0621) SpO2:  [94 %-100 %] 98 % (01/07 0621) Last BM Date: 01/15/18  Intake/Output from previous day: 01/06 0701 - 01/07 0700 In: 231.1 [I.V.:31.1; IV Piggyback:200] Out: -   Physical Examination: General: alert, cooperative and no distress Resp: clear to auscultation bilaterally Cardio: regular rate and rhythm, S1, S2 normal, no murmur, click, rub or gallop GI: soft, non-tender; bowel sounds normal; no masses,  no organomegaly Extremities: extremities normal, atraumatic, no cyanosis or edema  Labs: WBC/Hgb/Hct/Plts:  6.0/10.8/34.4/405 (01/07 0515) BUN/Cr/glu/ALT/AST/amyl/lip:  10/0.59/--/39/24/--/-- (01/07 0515)   Scans/Procedures: Abdomen 2 view from 01/15/18: IMPRESSION: Mildly dilated small bowel loops are noted concerning for distal small bowel obstruction or possibly ileus.  US Paracentesis 01/15/18: Successful ultrasound-guided diagnostic and therapeutic paracentesis yielding 1.5 liters of peritoneal fluid.  CT CAP 01/15/18: IMPRESSION: Chest CT: 1. No active cardiopulmonary disease. 2. Aortic atherosclerosis without aneurysm or dissection. 3. No large central pulmonary embolus.  CT AP: 1. Dilated fluid-filled loops of small bowel are redemonstrated slightly more extensive than on prior exam with transition point likely in the right adnexa adjacent to a complex cystic mass concerning for ovarian neoplasm given septations and soft tissue nodularity. This raises concern for early or partial SBO. This soft tissue  mass measures 4.9 x 4 x 4.7 cm and has not changed since prior recent comparison. Additional short segmental area of luminal narrowing is noted in the right lower quadrant involving small bowel for which stigmata of peritoneal carcinomatosis or small-bowel metastatic implants might account for this. 2. Redemonstration of small volume of ascites predominantly in the upper abdomen surrounding the liver and spleen. 3. Redemonstration of thick bandlike omental thickening concerning for peritoneal carcinomatosis.  Assessment: 73 y.o. female currently admitted for severe abdominal pain and at least a partial small bowel obstruction. She was recently seen in the office for a pelvic mass and carcinomatosis noted on CT scan with concerns for ovarian cancer: stable Pain:  Pain is well-controlled on PRN medications.  Heme: Hgb 10.8 and Hct 34.4 this am.   ID: WBC 6.0 this am. Blood cultures obtained in the ED. UA from ED reviewed. On Rocephin 2 G Q24H IV.   CV: BP and HR stable. Metoprolol XL and Cozaar ordered.   GI:  Tolerating po: No: NPO. See imaging above with most recent being CT CAP. Improvement in symptoms.   GU: Voiding adequate amounts. Creatinine 0.59 this am.    FEN: No critical values this am. Ca+ 7.8. Albumin 2.3.  Prophylaxis: Lovenox ordered.  Plan: -Dr. Gerarda Fraction to see patient later this am -Await results of cytology and fluid culture from paracentesis on 01/15/18 -IR feels they should be able to perform IR bx of omentum on 01/17/18 as previously scheduled if patient is still inpatient -With improvement in symptoms and return of bowel function, advancement of diet could be considered -Continue plan of care per Hospitalist Team. Appreciate Gen Surg consultation. -GYN ONC will continue to follow. Dr. Alvy Bimler made aware of admission   LOS: 0 days    Dorothyann Gibbs 01/16/2018, 8:13  AM

## 2018-01-16 NOTE — Consult Note (Signed)
Reason for Consult:abd pain Referring Physician: Dr. [name] is an 73 y.o. female.  HPI: The patient is a 73 year old white female who was recently noticed with ovarian cancer with peritoneal carcinomatosis.  She presents to the emergency department with severe abdominal pain.  She states this is been going on since late December.  She has met a couple times with the gynecologist and the plan was to refer her to the GYN oncologist and start her on chemotherapy.  She states she has been passing recently a small amount of flatus and a small amount of stool.  She denies any vomiting but has had some nausea.  Past Medical History:  Diagnosis Date  . Allergy    SEASONAL  . Cataract    BILATERAL  . Fibromyalgia   . GERD (gastroesophageal reflux disease)   . H/O echocardiogram 04/28/08   EF>55% trace mitral regurgitation, No significant valvular pathology  . Hemorrhoids    internal  . History of stress test 02/08/2011   Normal Myocardial perfusion study, this is a low risk scan, No prior study available for comparison  . Hyperlipidemia   . MVP (mitral valve prolapse)    mild and MR  . OA (osteoarthritis)   . Osteoporosis   . Paroxysmal A-fib (Haley [name])   . Sleep apnea    does not use prescribed CPAP  . Thyroid disease    HYPO  . Varicosities of leg   . Vitamin D deficiency     Past Surgical History:  Procedure Laterality Date  . ABDOMINAL HYSTERECTOMY  1989  . APPENDECTOMY  1989   with hysterectomy  . BREAST EXCISIONAL BIOPSY Left 1995   Benign  . COLONOSCOPY  11-20-2000  . NOSE SURGERY  1990    Family History  Problem Relation Age of Onset  . Diabetes Daughter   . Diabetes Mother   . Hypertension Mother   . Heart disease Mother   . Stroke Maternal Grandmother   . Cancer Niece        unsure breast or lung  . Colon cancer Neg Hx     Social History:  reports that she has been smoking cigars. She has a 5.00 pack-year smoking history. She has never used  smokeless tobacco. She reports that she does not drink alcohol or use drugs.  Allergies:  Allergies  Allergen Reactions  . Tramadol Hcl     REACTION: jittery  . Tramadol Nausea And Vomiting    Medications: I have reviewed the patient's current medications.  Results for orders placed or performed during the hospital encounter of 01/15/18 (from the past 48 hour(s))  Gram stain     Status: None   Collection Time: 01/15/18  2:35 PM  Result Value Ref Range   Specimen Description FLUID PERITONEAL    Special Requests NONE    Gram Stain      WBC PRESENT,BOTH PMN AND MONONUCLEAR NO ORGANISMS SEEN CYTOSPIN SMEAR Performed at [facility], [address] [place], [place] [address]    Report Status 01/15/2018 FINAL   Lipase, blood     Status: None   Collection Time: 01/15/18  3:32 PM  Result Value Ref Range   Lipase 30 11 - 51 U/L    Comment: Performed at [facility], [place] [address] [place], [place] [address]  Comprehensive metabolic panel     Status: Abnormal   Collection Time: 01/15/18  3:32 PM  Result Value Ref Range   Sodium 134 (  L) 135 - 145 mmol/L   Potassium 3.4 (L) 3.5 - 5.1 mmol/L   Chloride 100 98 - 111 mmol/L   CO2 24 22 - 32 mmol/L   Glucose, Bld 106 (H) 70 - 99 mg/dL   BUN 10 8 - 23 mg/dL   Creatinine, Ser 0.54 0.44 - 1.00 mg/dL   Calcium 8.4 (L) 8.9 - 10.3 mg/dL   Total Protein 6.4 (L) 6.5 - 8.1 g/dL   Albumin 2.8 (L) 3.5 - 5.0 g/dL   AST 40 15 - 41 U/L   ALT 56 (H) 0 - 44 U/L   Alkaline Phosphatase 208 (H) 38 - 126 U/L   Total Bilirubin 0.3 0.3 - 1.2 mg/dL   GFR calc non Af Amer >60 >60 mL/min   GFR calc Af Amer >60 >60 mL/min   Anion gap 10 5 - 15    Comment: Performed at [facility], [place] [address] [place], [place] [address]  CBC     Status: Abnormal   Collection Time: 01/15/18  3:32 PM  Result Value Ref Range   WBC 9.0 4.0 - 10.5 K/uL   RBC 3.99 3.87 - 5.11 MIL/uL   Hemoglobin 11.5 (L) 12.0 - 15.0 g/dL    HCT 35.5 (L) 36.0 - 46.0 %   MCV 89.0 80.0 - 100.0 fL   MCH 28.8 26.0 - 34.0 pg   MCHC 32.4 30.0 - 36.0 g/dL   RDW 13.2 11.5 - 15.5 %   Platelets 387 150 - 400 K/uL   nRBC 0.0 0.0 - 0.2 %    Comment: Performed at [facility], [place] [address], [place], [place] [address]  Urinalysis, Routine w reflex microscopic     Status: Abnormal   Collection Time: 01/15/18  3:55 PM  Result Value Ref Range   Color, Urine STRAW (A) YELLOW   APPearance CLEAR CLEAR   Specific Gravity, Urine 1.002 (L) 1.005 - 1.030   pH 6.0 5.0 - 8.0   Glucose, UA NEGATIVE NEGATIVE mg/dL   Hgb urine dipstick SMALL (A) NEGATIVE   Bilirubin Urine NEGATIVE NEGATIVE   Ketones, ur NEGATIVE NEGATIVE mg/dL   Protein, ur NEGATIVE NEGATIVE mg/dL   Nitrite NEGATIVE NEGATIVE   Leukocytes, UA NEGATIVE NEGATIVE   WBC, UA 0-5 0 - 5 WBC/hpf   Bacteria, UA NONE SEEN NONE SEEN    Comment: Performed at [facility], [place] [address] [place], [place] [address]  I-Stat CG4 Lactic Acid, ED     Status: None   Collection Time: 01/15/18  4:45 PM  Result Value Ref Range   Lactic Acid, Venous 0.61 0.5 - 1.9 mmol/L    Ct Chest W Contrast  Result Date: 01/15/2018 CLINICAL DATA:  Abdominal distention with no bowel movements. Radiographs demonstrated small-bowel obstruction or ileus. History of hysterectomy and appendectomy. EXAM: CT CHEST, ABDOMEN, AND PELVIS WITH CONTRAST TECHNIQUE: Multidetector CT imaging of the chest, abdomen and pelvis was performed following the standard protocol during bolus administration of intravenous contrast. CONTRAST:  155m ISOVUE-300 IOPAMIDOL (ISOVUE-300) INJECTION 61% COMPARISON:  Radiographs of the abdomen from 01/15/2018, CT abdomen pelvis 01/06/2018 FINDINGS: CT CHEST FINDINGS Cardiovascular: Conventional branch pattern of the great vessels with mild calcific atherosclerosis. No aneurysm or dissection. The thoracic aorta is nonaneurysmal demonstrates mild atherosclerosis at  the arch and along the descending portion. Heart size is top normal without pericardial effusion or thickening. No significant coronary arteriosclerosis. No large central pulmonary embolus. Mediastinum/Nodes: No enlarged mediastinal, hilar, or axillary lymph  nodes. The thyroid gland is excluded on this study. Patent trachea and mainstem bronchi. Unremarkable CT appearance of the thoracic esophagus. Lungs/Pleura: Linear atelectasis and/or scarring is noted in the left lower lobe. No acute pulmonary consolidation or dominant mass. No pneumothorax is noted. Musculoskeletal: No chest wall mass or suspicious bone lesions identified. CT ABDOMEN PELVIS FINDINGS Hepatobiliary: Homogeneous enhancement of the liver. Small left hepatic lobe cyst measuring approximately 1 cm, series 2/42. The gallbladder is distended without stones possibly from a fasting state. No mural thickening or pericholecystic fluid. Small amount perihepatic ascites is identified. Slight intrahepatic ductal dilatation may be due to fullness of the gallbladder. Pancreas: Coarse calcification noted in the body of the pancreas. No acute inflammatory process, mass or ductal dilatation. Spleen: Normal size spleen without mass. Adrenals/Urinary Tract: Normal bilateral adrenal glands and symmetric cortical enhancement of both kidneys. Delayed imaging through both kidneys demonstrate symmetric pyelograms without obstructive uropathy. The urinary bladder is physiologically distended and unremarkable for the degree of distention. Stomach/Bowel: Small hiatal hernia is identified. The stomach is nondistended. The duodenal sweep and ligament of Treitz is unremarkable. Fluid-filled distended jejunal loops to 4.1 cm are identified with transition point suggested in the right lower quadrant, series 2, image 98. Additionally there is a short segmental area of luminal narrowing with mural thickening possibly representing small stricture or involvement by neoplasm is not  excluded, series 2/89 measuring up to 1.7 cm in length with single wall thickness of up to 6 mm. The transition point appears to be adjacent to the right ovarian complex cystic mass and may be adherent as previously suggested on prior report. The patient is status post appendectomy. The colon is relatively decompressed in appearance. Vascular/Lymphatic: Patent portal and splenic veins. Moderate aortic iliac atherosclerosis without aneurysm. Lymphadenopathy identified by CT size criteria. Reproductive: Complex cystic mass with septations and possible soft tissue components along the caudal aspect is identified in the right adnexa measuring 4.9 x 4 x 4.7 cm (AP by transverse by craniocaudad). Findings are concerning for possible ovarian neoplasm. Further correlation with MRI is recommended. A more simple appearing left adnexal cyst measuring 5.3 x 3.8 x 4.5 cm is identified. The patient is status post hysterectomy. Other: Small volume of ascites is noted in the upper abdomen. There appears to be rind like thickening of the peritoneum concerning for peritoneal caking, series 2/61 in the left upper quadrant in particular concerning for peritoneal carcinomatosis. Musculoskeletal: No acute or significant osseous findings. IMPRESSION: Chest CT: 1. No active cardiopulmonary disease. 2. Aortic atherosclerosis without aneurysm or dissection. 3. No large central pulmonary embolus. CT AP: 1. Dilated fluid-filled loops of small bowel are redemonstrated slightly more extensive than on prior exam with transition point likely in the right adnexa adjacent to a complex cystic mass concerning for ovarian neoplasm given septations and soft tissue nodularity. This raises concern for early or partial SBO. This soft tissue mass measures 4.9 x 4 x 4.7 cm and has not changed since prior recent comparison. Additional short segmental area of luminal narrowing is noted in the right lower quadrant involving small bowel for which stigmata of  peritoneal carcinomatosis or small-bowel metastatic implants might account for this. 2. Redemonstration of small volume of ascites predominantly in the upper abdomen surrounding the liver and spleen. 3. Redemonstration of thick bandlike omental thickening concerning for peritoneal carcinomatosis. Electronically Signed   By: Ashley Royalty M.D.   On: 01/15/2018 19:22   Ct Abdomen Pelvis W Contrast  Result Date: 01/15/2018 CLINICAL DATA:  Abdominal distention with no bowel movements. Radiographs demonstrated small-bowel obstruction or ileus. History of hysterectomy and appendectomy. EXAM: CT CHEST, ABDOMEN, AND PELVIS WITH CONTRAST TECHNIQUE: Multidetector CT imaging of the chest, abdomen and pelvis was performed following the standard protocol during bolus administration of intravenous contrast. CONTRAST:  151m ISOVUE-300 IOPAMIDOL (ISOVUE-300) INJECTION 61% COMPARISON:  Radiographs of the abdomen from 01/15/2018, CT abdomen pelvis 01/06/2018 FINDINGS: CT CHEST FINDINGS Cardiovascular: Conventional branch pattern of the great vessels with mild calcific atherosclerosis. No aneurysm or dissection. The thoracic aorta is nonaneurysmal demonstrates mild atherosclerosis at the arch and along the descending portion. Heart size is top normal without pericardial effusion or thickening. No significant coronary arteriosclerosis. No large central pulmonary embolus. Mediastinum/Nodes: No enlarged mediastinal, hilar, or axillary lymph nodes. The thyroid gland is excluded on this study. Patent trachea and mainstem bronchi. Unremarkable CT appearance of the thoracic esophagus. Lungs/Pleura: Linear atelectasis and/or scarring is noted in the left lower lobe. No acute pulmonary consolidation or dominant mass. No pneumothorax is noted. Musculoskeletal: No chest wall mass or suspicious bone lesions identified. CT ABDOMEN PELVIS FINDINGS Hepatobiliary: Homogeneous enhancement of the liver. Small left hepatic lobe cyst measuring  approximately 1 cm, series 2/42. The gallbladder is distended without stones possibly from a fasting state. No mural thickening or pericholecystic fluid. Small amount perihepatic ascites is identified. Slight intrahepatic ductal dilatation may be due to fullness of the gallbladder. Pancreas: Coarse calcification noted in the body of the pancreas. No acute inflammatory process, mass or ductal dilatation. Spleen: Normal size spleen without mass. Adrenals/Urinary Tract: Normal bilateral adrenal glands and symmetric cortical enhancement of both kidneys. Delayed imaging through both kidneys demonstrate symmetric pyelograms without obstructive uropathy. The urinary bladder is physiologically distended and unremarkable for the degree of distention. Stomach/Bowel: Small hiatal hernia is identified. The stomach is nondistended. The duodenal sweep and ligament of Treitz is unremarkable. Fluid-filled distended jejunal loops to 4.1 cm are identified with transition point suggested in the right lower quadrant, series 2, image 98. Additionally there is a short segmental area of luminal narrowing with mural thickening possibly representing small stricture or involvement by neoplasm is not excluded, series 2/89 measuring up to 1.7 cm in length with single wall thickness of up to 6 mm. The transition point appears to be adjacent to the right ovarian complex cystic mass and may be adherent as previously suggested on prior report. The patient is status post appendectomy. The colon is relatively decompressed in appearance. Vascular/Lymphatic: Patent portal and splenic veins. Moderate aortic iliac atherosclerosis without aneurysm. Lymphadenopathy identified by CT size criteria. Reproductive: Complex cystic mass with septations and possible soft tissue components along the caudal aspect is identified in the right adnexa measuring 4.9 x 4 x 4.7 cm (AP by transverse by craniocaudad). Findings are concerning for possible ovarian neoplasm.  Further correlation with MRI is recommended. A more simple appearing left adnexal cyst measuring 5.3 x 3.8 x 4.5 cm is identified. The patient is status post hysterectomy. Other: Small volume of ascites is noted in the upper abdomen. There appears to be rind like thickening of the peritoneum concerning for peritoneal caking, series 2/61 in the left upper quadrant in particular concerning for peritoneal carcinomatosis. Musculoskeletal: No acute or significant osseous findings. IMPRESSION: Chest CT: 1. No active cardiopulmonary disease. 2. Aortic atherosclerosis without aneurysm or dissection. 3. No large central pulmonary embolus. CT AP: 1. Dilated fluid-filled loops of small bowel are redemonstrated slightly more extensive than on prior exam with transition point likely in the right  adnexa adjacent to a complex cystic mass concerning for ovarian neoplasm given septations and soft tissue nodularity. This raises concern for early or partial SBO. This soft tissue mass measures 4.9 x 4 x 4.7 cm and has not changed since prior recent comparison. Additional short segmental area of luminal narrowing is noted in the right lower quadrant involving small bowel for which stigmata of peritoneal carcinomatosis or small-bowel metastatic implants might account for this. 2. Redemonstration of small volume of ascites predominantly in the upper abdomen surrounding the liver and spleen. 3. Redemonstration of thick bandlike omental thickening concerning for peritoneal carcinomatosis. Electronically Signed   By: Ashley Royalty M.D.   On: 01/15/2018 19:22   US Paracentesis  Result Date: 01/15/2018 INDICATION: Patient with history of abdominal pain, complex right adnexal cystic mass, elevated CA-125, omental nodularity, ascites. Request made for diagnostic and therapeutic paracentesis. EXAM: ULTRASOUND GUIDED DIAGNOSTIC AND THERAPEUTIC PARACENTESIS MEDICATIONS: None COMPLICATIONS: None immediate. PROCEDURE: Informed written consent was  obtained from the patient after a discussion of the risks, benefits and alternatives to treatment. A timeout was performed prior to the initiation of the procedure. Initial ultrasound scanning demonstrates a small amount of ascites within the right lower abdominal quadrant. The right lower abdomen was prepped and draped in the usual sterile fashion. 1% lidocaine was used for local anesthesia. Following this, a 19 gauge, 10-cm, Yueh catheter was introduced. An ultrasound image was saved for documentation purposes. The paracentesis was performed. The catheter was removed and a dressing was applied. The patient tolerated the procedure well without immediate post procedural complication. FINDINGS: A total of approximately 1.5 liters of hazy, yellow fluid was removed. Samples were sent to the laboratory as requested by the clinical team. IMPRESSION: Successful ultrasound-guided diagnostic and therapeutic paracentesis yielding 1.5 liters of peritoneal fluid. Read by: Rowe Robert, PA-C Electronically Signed   By: Jacqulynn Cadet M.D.   On: 01/15/2018 14:43   Dg Abd 2 Views  Result Date: 01/15/2018 CLINICAL DATA:  Acute generalized abdominal pain and distention. History of ovarian cancer. EXAM: ABDOMEN - 2 VIEW COMPARISON:  CT scan of January 06, 2018. FINDINGS: Mildly dilated small bowel loops are noted concerning for distal small bowel obstruction or ileus. No colonic dilatation is noted. There is no evidence of free air. No radio-opaque calculi or other significant radiographic abnormality is seen. IMPRESSION: Mildly dilated small bowel loops are noted concerning for distal small bowel obstruction or possibly ileus. Electronically Signed   By: Marijo Conception, M.D.   On: 01/15/2018 14:25    Review of Systems  Constitutional: Positive for malaise/fatigue and weight loss.  HENT: Negative.   Eyes: Negative.   Respiratory: Negative.   Cardiovascular: Negative.   Gastrointestinal: Positive for abdominal pain  and nausea. Negative for vomiting.  Genitourinary: Negative.   Musculoskeletal: Negative.   Skin: Negative.   Neurological: Negative.   Endo/Heme/Allergies: Negative.   Psychiatric/Behavioral: Negative.    Blood pressure 112/62, pulse 65, temperature 98.3 F (36.8 C), temperature source Oral, resp. rate 18, SpO2 96 %. Physical Exam  Constitutional: She is oriented to person, place, and time. She appears well-developed and well-nourished. No distress.  HENT:  Head: Normocephalic and atraumatic.  Mouth/Throat: No oropharyngeal exudate.  Eyes: Pupils are equal, round, and reactive to light. Conjunctivae and EOM are normal.  Neck: Normal range of motion. Neck supple.  Cardiovascular: Normal rate, regular rhythm and normal heart sounds.  Respiratory: Effort normal and breath sounds normal. No stridor. No respiratory distress.  GI: Soft.  Bowel sounds are normal. She exhibits distension. There is abdominal tenderness.  Musculoskeletal: Normal range of motion.        General: No tenderness or edema.  Neurological: She is alert and oriented to person, place, and time. Coordination normal.  Skin: Skin is warm and dry. No erythema.  Psychiatric: She has a normal mood and affect. Her behavior is normal. Thought content normal.    Assessment/Plan: The patient appears to have metastatic ovarian cancer with peritoneal carcinomatosis.  She also appears to be developing at least a partial small bowel obstruction.  If she obstructs she may require more urgent surgery.  I have explained to her that surgery is not likely to cure the cancer but may be able to bypass an obstructed loop to give her a little more time.  I would highly recommend that she be seen by the GYN oncologist and medical oncologist we will follow with you and be available if needed  Autumn Messing III 01/16/2018, 12:42 AM

## 2018-01-16 NOTE — Progress Notes (Signed)
Lovenox placed on hold for possible IR biopsy of the omentum tomorrow.  Plan for repeat Abd 2 view today to assess bowel dilation prior to proceeding with possible IR biopsy tomorrow.

## 2018-01-17 ENCOUNTER — Ambulatory Visit (HOSPITAL_COMMUNITY): Payer: PPO

## 2018-01-17 ENCOUNTER — Inpatient Hospital Stay (HOSPITAL_COMMUNITY): Payer: PPO

## 2018-01-17 ENCOUNTER — Encounter (HOSPITAL_COMMUNITY): Payer: Self-pay

## 2018-01-17 ENCOUNTER — Encounter (HOSPITAL_COMMUNITY): Payer: Self-pay | Admitting: Interventional Radiology

## 2018-01-17 ENCOUNTER — Encounter: Payer: Self-pay | Admitting: Oncology

## 2018-01-17 ENCOUNTER — Ambulatory Visit (HOSPITAL_COMMUNITY): Admission: RE | Admit: 2018-01-17 | Payer: PPO | Source: Ambulatory Visit

## 2018-01-17 ENCOUNTER — Telehealth: Payer: Self-pay | Admitting: Oncology

## 2018-01-17 ENCOUNTER — Other Ambulatory Visit: Payer: Self-pay | Admitting: Hematology and Oncology

## 2018-01-17 ENCOUNTER — Telehealth: Payer: Self-pay | Admitting: Cardiovascular Disease

## 2018-01-17 DIAGNOSIS — C800 Disseminated malignant neoplasm, unspecified: Secondary | ICD-10-CM

## 2018-01-17 DIAGNOSIS — C569 Malignant neoplasm of unspecified ovary: Secondary | ICD-10-CM

## 2018-01-17 DIAGNOSIS — K56609 Unspecified intestinal obstruction, unspecified as to partial versus complete obstruction: Secondary | ICD-10-CM

## 2018-01-17 DIAGNOSIS — R1084 Generalized abdominal pain: Secondary | ICD-10-CM

## 2018-01-17 HISTORY — PX: IR IMAGING GUIDED PORT INSERTION: IMG5740

## 2018-01-17 LAB — CBC WITH DIFFERENTIAL/PLATELET
Abs Immature Granulocytes: 0.06 10*3/uL (ref 0.00–0.07)
Basophils Absolute: 0 10*3/uL (ref 0.0–0.1)
Basophils Relative: 0 %
Eosinophils Absolute: 0.2 10*3/uL (ref 0.0–0.5)
Eosinophils Relative: 2 %
HEMATOCRIT: 38.3 % (ref 36.0–46.0)
Hemoglobin: 11.8 g/dL — ABNORMAL LOW (ref 12.0–15.0)
Immature Granulocytes: 1 %
Lymphocytes Relative: 15 %
Lymphs Abs: 1.4 10*3/uL (ref 0.7–4.0)
MCH: 27.9 pg (ref 26.0–34.0)
MCHC: 30.8 g/dL (ref 30.0–36.0)
MCV: 90.5 fL (ref 80.0–100.0)
MONO ABS: 1 10*3/uL (ref 0.1–1.0)
Monocytes Relative: 11 %
Neutro Abs: 6.5 10*3/uL (ref 1.7–7.7)
Neutrophils Relative %: 71 %
Platelets: 456 10*3/uL — ABNORMAL HIGH (ref 150–400)
RBC: 4.23 MIL/uL (ref 3.87–5.11)
RDW: 13.5 % (ref 11.5–15.5)
WBC: 9.2 10*3/uL (ref 4.0–10.5)
nRBC: 0 % (ref 0.0–0.2)

## 2018-01-17 LAB — COMPREHENSIVE METABOLIC PANEL
ALT: 33 U/L (ref 0–44)
AST: 25 U/L (ref 15–41)
Albumin: 2.6 g/dL — ABNORMAL LOW (ref 3.5–5.0)
Alkaline Phosphatase: 140 U/L — ABNORMAL HIGH (ref 38–126)
Anion gap: 11 (ref 5–15)
BUN: 9 mg/dL (ref 8–23)
CO2: 20 mmol/L — ABNORMAL LOW (ref 22–32)
Calcium: 7.9 mg/dL — ABNORMAL LOW (ref 8.9–10.3)
Chloride: 103 mmol/L (ref 98–111)
Creatinine, Ser: 0.65 mg/dL (ref 0.44–1.00)
GFR calc Af Amer: >60 mL/min (ref >60)
GFR calc non Af Amer: >60 mL/min (ref >60)
GLUCOSE: 78 mg/dL (ref 70–99)
Potassium: 3.8 mmol/L (ref 3.5–5.1)
Sodium: 134 mmol/L — ABNORMAL LOW (ref 135–145)
Total Bilirubin: 0.7 mg/dL (ref 0.3–1.2)
Total Protein: 5.9 g/dL — ABNORMAL LOW (ref 6.5–8.1)

## 2018-01-17 LAB — PROTIME-INR
INR: 1.11
Prothrombin Time: 14.2 seconds (ref 11.4–15.2)

## 2018-01-17 MED ORDER — CEFAZOLIN SODIUM-DEXTROSE 2-4 GM/100ML-% IV SOLN
2.0000 g | INTRAVENOUS | Status: AC
  Administered 2018-01-17: 2 g via INTRAVENOUS

## 2018-01-17 MED ORDER — CEFAZOLIN SODIUM-DEXTROSE 2-4 GM/100ML-% IV SOLN
INTRAVENOUS | Status: AC
  Administered 2018-01-17: 2 g via INTRAVENOUS
  Filled 2018-01-17: qty 100

## 2018-01-17 MED ORDER — LIDOCAINE HCL (PF) 1 % IJ SOLN
INTRAMUSCULAR | Status: AC | PRN
  Administered 2018-01-17: 5 mL

## 2018-01-17 MED ORDER — FENTANYL CITRATE (PF) 100 MCG/2ML IJ SOLN
INTRAMUSCULAR | Status: AC | PRN
  Administered 2018-01-17: 50 ug via INTRAVENOUS

## 2018-01-17 MED ORDER — MIDAZOLAM HCL 2 MG/2ML IJ SOLN
INTRAMUSCULAR | Status: AC | PRN
  Administered 2018-01-17: 1 mg via INTRAVENOUS

## 2018-01-17 MED ORDER — FENTANYL CITRATE (PF) 100 MCG/2ML IJ SOLN
INTRAMUSCULAR | Status: AC
  Filled 2018-01-17: qty 2

## 2018-01-17 MED ORDER — LIDOCAINE HCL (PF) 1 % IJ SOLN
INTRAMUSCULAR | Status: AC | PRN
  Administered 2018-01-17: 10 mL

## 2018-01-17 MED ORDER — DEXAMETHASONE SODIUM PHOSPHATE 10 MG/ML IJ SOLN
20.0000 mg | Freq: Once | INTRAMUSCULAR | Status: AC
  Administered 2018-01-18: 20 mg via INTRAVENOUS
  Filled 2018-01-17: qty 2

## 2018-01-17 MED ORDER — MIDAZOLAM HCL 2 MG/2ML IJ SOLN
INTRAMUSCULAR | Status: AC
  Filled 2018-01-17: qty 4

## 2018-01-17 MED ORDER — DEXAMETHASONE SODIUM PHOSPHATE 10 MG/ML IJ SOLN
20.0000 mg | Freq: Once | INTRAMUSCULAR | Status: AC
  Administered 2018-01-17: 20 mg via INTRAVENOUS
  Filled 2018-01-17: qty 2

## 2018-01-17 MED ORDER — LIDOCAINE-EPINEPHRINE (PF) 2 %-1:200000 IJ SOLN
INTRAMUSCULAR | Status: AC
  Filled 2018-01-17: qty 20

## 2018-01-17 MED ORDER — HYDROMORPHONE HCL 1 MG/ML IJ SOLN
1.0000 mg | INTRAMUSCULAR | Status: DC | PRN
  Administered 2018-01-17 – 2018-01-29 (×14): 1 mg via INTRAVENOUS
  Filled 2018-01-17 (×16): qty 1

## 2018-01-17 MED ORDER — HEPARIN SOD (PORK) LOCK FLUSH 100 UNIT/ML IV SOLN
INTRAVENOUS | Status: AC
  Filled 2018-01-17: qty 5

## 2018-01-17 NOTE — Telephone Encounter (Signed)
Santiago Glad from the Cancer center called wanting to speak to Dr. Evette Georges nurse.  The patient is needs to have a porta-cath placed, and the patient wanted them to check with Dr. Claiborne Billings first to make sure it is ok for her to have to placed.  Santiago Glad can be reached at [phone].

## 2018-01-17 NOTE — Progress Notes (Signed)
Patient to be transferred to room 1619, report called to [place], [place]

## 2018-01-17 NOTE — Progress Notes (Signed)
Pt experienced abdominal and back pain throughout the night.  Pt stated that the 5mg  oxycodone did not help, but the Dilaudid gave relief.   Pt has been NPO w/sips for meds since midnight.

## 2018-01-17 NOTE — Telephone Encounter (Signed)
Left a message for Dr. Evette Georges nurse regarding port placement per patient request.  Requested a return call.

## 2018-01-17 NOTE — Telephone Encounter (Signed)
Called Dr.Kelly from [facility], did discuss case with him, discussed medications and per [facility] if patient is not on any anticoagulations, other than the Aspirin that patient would be okay to proceed with Portacath. Called Santiago Glad and spoke with her. Santiago Glad verbalized understanding.

## 2018-01-17 NOTE — Telephone Encounter (Signed)
Almyra Free, RN from Dr. Evette Georges office called back.  She said that per Dr. Claiborne Billings, it is OK for patient to have porta cath placement as long as she is not on any anticoagulants.

## 2018-01-17 NOTE — Progress Notes (Signed)
Patient ID: Haley [name], female   DOB: 10-28-1945, 73 y.o.   MRN: [id number] Omental bx which was originally scheduled for today has been cancelled . Ascitic fluid pathology was positive for adenocarcinoma of probable gyn primary. Request received today for port a cath placement. Risks and benefits of image guided port-a-catheter placement was discussed with the patient including, but not limited to bleeding, infection, pneumothorax, or fibrin sheath development and need for additional procedures.  All of the patient's questions were answered, patient is agreeable to proceed. Consent signed and in chart.  Procedure scheduled for today.

## 2018-01-17 NOTE — Progress Notes (Signed)
[facility] CONSULT NOTE  Patient Care Team: Martinique, Omie G, MD as PCP - General (Family Medicine)  ASSESSMENT & PLAN:   Locally advanced cancer with carcinomatosis, suspicious for ovarian cancer The patient will not get better without treatment Due to her weakened state and poor performance status, upfront surgery is not ideal We discussed the role of neoadjuvant chemotherapy approach with carboplatin and Taxol She will get port placement today instead of peritoneal biopsy We will get her moved to 6 E. in anticipation for chemotherapy tomorrow We will get nursing staff to provide chemo education   We reviewed the NCCN guidelines We discussed the role of chemotherapy. The intent is of curative intent.  We discussed some of the risks, benefits, side-effects of carboplatin & Taxol. Treatment is intravenous, every 3 weeks x 6 cycles  Some of the short term side-effects included, though not limited to, including weight loss, life threatening infections, risk of allergic reactions, need for transfusions of blood products, nausea, vomiting, change in bowel habits, loss of hair, admission to hospital for various reasons, and risks of death.   Long term side-effects are also discussed including risks of infertility, permanent damage to nerve function, hearing loss, chronic fatigue, kidney damage with possibility needing hemodialysis, and rare secondary malignancy including bone marrow disorders.  The patient is aware that the response rates discussed earlier is not guaranteed.  After a long discussion, patient made an informed decision to proceed with the prescribed plan of care.   I will order premedication IV dexamethasone tonight and tomorrow I will alert the inpatient chemotherapy team to start chemotherapy tomorrow  Nausea, vomiting, bloating and constipation Her symptoms are most consistent with carcinomatosis/partial small bowel obstruction Continue conservative  management I am hopeful with treatment, her symptoms will resolve within the next few days  Malignant ascites status post therapeutic paracentesis on January 15, 2018 She still have presence of ascites but at present time, I do not believe she needs another therapeutic paracentesis  Severe protein calorie malnutrition Hopefully, she can start eating once her chemotherapy start working  Discharge planning Not ready for discharge due to unresolved bowel obstructive symptoms  I spent 55 minutes counseling the patient face to face. The total time spent in the appointment was 60 minutes and more than 50% was on counseling.  All questions were answered. The patient knows to call the clinic with any problems, questions or concerns.  Heath Lark, MD 01/17/2018 11:28 AM   CHIEF COMPLAINTS/PURPOSE OF CONSULTATION:  Locally advanced ovarian cancer with carcinomatosis  HISTORY OF PRESENTING ILLNESS:  Haley [name] 73 y.o. female is seen at the request by GYN oncologist and hospitalist to evaluate and treat for locally advanced ovarian cancer The patient lives at home with her husband.  She is retired She has 3 children but one is deceased.  She has 1 living son and daughter  She has been complaining of lower abdominal discomfort and bloating sensation for many months. She denies abnormal vaginal bleeding.  She has been complaining of early satiety  Subsequently, she had undergone further evaluation with imaging study and was found to have locally advanced cancer. Due to non-resolving symptoms of small bowel obstruction, I am consulted to consider neoadjuvant chemotherapy  I have reviewed her chart and materials related to her cancer extensively and collaborated history with the patient. Summary of oncologic history is as follows:   Ovarian cancer ([place]) suspected   01/06/2018 Imaging    Ct abdomen and pelvis 1.  Complex cystic mass at the right adnexa, measuring 5.9 x 4.0 cm, with nodular  components, concerning for primary ovarian malignancy. 2. Diffuse nodularity along the omentum at the left side of the abdomen, extending into the mesentery at the left mid abdomen, concerning for peritoneal carcinomatosis. 3. Wall thickening at the distal ileum adjacent to the ovarian mass; bowel loops appear somewhat adherent to the ovarian mass. Bowel infiltration with tumor cannot be excluded. No evidence of bowel obstruction at this time. 4. Small volume ascites within the abdomen and pelvis.  Aortic Atherosclerosis (ICD10-I70.0).    01/08/2018 Tumor Marker    Patient's tumor was tested for the following markers: CA-125. Results of the tumor marker test revealed 3004    01/15/2018 Imaging    Chest CT:  1. No active cardiopulmonary disease. 2. Aortic atherosclerosis without aneurysm or dissection. 3. No large central pulmonary embolus.  CT AP:  1. Dilated fluid-filled loops of small bowel are redemonstrated slightly more extensive than on prior exam with transition point likely in the right adnexa adjacent to a complex cystic mass concerning for ovarian neoplasm given septations and soft tissue nodularity. This raises concern for early or partial SBO. This soft tissue mass measures 4.9 x 4 x 4.7 cm and has not changed since prior recent comparison. Additional short segmental area of luminal narrowing is noted in the right lower quadrant involving small bowel for which stigmata of peritoneal carcinomatosis or small-bowel metastatic implants might account for this. 2. Redemonstration of small volume of ascites predominantly in the upper abdomen surrounding the liver and spleen. 3. Redemonstration of thick bandlike omental thickening concerning for peritoneal carcinomatosis.     01/15/2018 Procedure    Successful ultrasound-guided diagnostic and therapeutic paracentesis yielding 1.5 liters of peritoneal fluid.     01/15/2018 Pathology Results    PERITONEAL/ASCITIC FLUID (SPECIMEN 1 OF  1 COLLECTED 01/18/18): MALIGNANT CELLS CONSISTENT WITH METASTATIC ADENOCARCINOMA. SEE COMMENT. COMMENT: THE MALIGNANT CELLS ARE POSITIVE FOR MOC-31, CYTOKERATIN 7, ESTROGEN RECEPTOR, PAX-8, AND WT-1. THEY ARE NEGATIVE FOR CALRETININ, CYTOKERATIN 5/6, AND CYTOKERATIN 20. THE PROFILE IS CONSISTENT WITH A PRIMARY GYNECOLOGIC CARCINOMA. THERE IS LIKELY SUFFICIENT TUMOR PRESENT, IF ADDITIONAL STUDIES ARE REQUESTED.    She felt that her abdominal pain is stable only if she gets intravenous pain medicine.  She has minimum passage of flatus yesterday but no bowel movement for several days.  She denies nausea now  MEDICAL HISTORY:  Past Medical History:  Diagnosis Date  . Allergy    SEASONAL  . Cataract    BILATERAL  . Fibromyalgia   . GERD (gastroesophageal reflux disease)   . H/O echocardiogram 04/28/08   EF>55% trace mitral regurgitation, No significant valvular pathology  . Hemorrhoids    internal  . History of stress test 02/08/2011   Normal Myocardial perfusion study, this is a low risk scan, No prior study available for comparison  . Hyperlipidemia   . MVP (mitral valve prolapse)    mild and MR  . OA (osteoarthritis)   . Osteoporosis   . Paroxysmal A-fib ([place])   . Sleep apnea    does not use prescribed CPAP  . Thyroid disease    HYPO  . Varicosities of leg   . Vitamin D deficiency     SURGICAL HISTORY: Past Surgical History:  Procedure Laterality Date  . ABDOMINAL HYSTERECTOMY  1989  . APPENDECTOMY  1989   with hysterectomy  . BREAST EXCISIONAL BIOPSY Left 1995   Benign  . COLONOSCOPY  11-20-2000  .  NOSE SURGERY  1990    SOCIAL HISTORY: Social History   Socioeconomic History  . Marital status: Married    Spouse name: Not on file  . Number of children: Not on file  . Years of education: Not on file  . Highest education level: Not on file  Occupational History  . Not on file  Social Needs  . Financial resource strain: Not on file  . Food insecurity:    Worry:  Not on file    Inability: Not on file  . Transportation needs:    Medical: Not on file    Non-medical: Not on file  Tobacco Use  . Smoking status: Current Every Day Smoker    Packs/day: 0.50    Years: 10.00    Pack years: 5.00    Types: Cigars  . Smokeless tobacco: Never Used  . Tobacco comment: peach flavor   Substance and Sexual Activity  . Alcohol use: No    Alcohol/week: 0.0 standard drinks  . Drug use: No  . Sexual activity: Yes    Comment: 1st intercourse 17yo-1 partner  Lifestyle  . Physical activity:    Days per week: Not on file    Minutes per session: Not on file  . Stress: Not on file  Relationships  . Social connections:    Talks on phone: Not on file    Gets together: Not on file    Attends religious service: Not on file    Active member of club or organization: Not on file    Attends meetings of clubs or organizations: Not on file    Relationship status: Not on file  . Intimate partner violence:    Fear of current or ex partner: Not on file    Emotionally abused: Not on file    Physically abused: Not on file    Forced sexual activity: Not on file  Other Topics Concern  . Not on file  Social History Narrative  . Not on file    FAMILY HISTORY: Family History  Problem Relation Age of Onset  . Diabetes Daughter   . Diabetes Mother   . Hypertension Mother   . Heart disease Mother   . Stroke Maternal Grandmother   . Cancer Niece        unsure breast or lung  . Colon cancer Neg Hx     ALLERGIES:  is allergic to tramadol hcl and tramadol.  MEDICATIONS:  Current Facility-Administered Medications  Medication Dose Route Frequency Provider Last Rate Last Dose  . 0.9 %  sodium chloride infusion   Intravenous Continuous Schorr, Rhetta Mura, NP 75 mL/hr at 01/16/18 2156    . ALPRAZolam (XANAX) tablet 0.25-0.5 mg  0.25-0.5 mg Oral Daily PRN Johnson-Pitts, Endia, MD   0.5 mg at 01/16/18 1043  . cefTRIAXone (ROCEPHIN) 2 g in sodium chloride 0.9 % 100 mL IVPB   2 g Intravenous Q24H Johnson-Pitts, Endia, MD 200 mL/hr at 01/17/18 0112 2 g at 01/17/18 0112  . fluticasone (FLONASE) 50 MCG/ACT nasal spray 2 spray  2 spray Each Nare Daily Johnson-Pitts, Endia, MD   2 spray at 01/16/18 0959  . HYDROmorphone (DILAUDID) injection 1 mg  1 mg Intravenous Q4H PRN Samuella Cota, MD      . levothyroxine (SYNTHROID, LEVOTHROID) tablet 75 mcg  75 mcg Oral Daily Johnson-Pitts, Endia, MD   75 mcg at 01/17/18 0527  . losartan (COZAAR) tablet 25 mg  25 mg Oral Daily Johnson-Pitts, Endia, MD   25 mg  at 01/16/18 0959  . metoprolol succinate (TOPROL-XL) 24 hr tablet 25 mg  25 mg Oral Daily Johnson-Pitts, Endia, MD   25 mg at 01/16/18 0959  . ondansetron (ZOFRAN) tablet 4 mg  4 mg Oral Q6H PRN Johnson-Pitts, Endia, MD       Or  . ondansetron (ZOFRAN) injection 4 mg  4 mg Intravenous Q6H PRN Johnson-Pitts, Endia, MD   4 mg at 01/16/18 1040  . oxyCODONE (Oxy IR/ROXICODONE) immediate release tablet 5 mg  5 mg Oral Q4H PRN Johnson-Pitts, Endia, MD   5 mg at 01/17/18 0527  . pantoprazole (PROTONIX) EC tablet 40 mg  40 mg Oral Daily Johnson-Pitts, Endia, MD   40 mg at 01/16/18 0959  . senna (SENOKOT) tablet 8.6 mg  1 tablet Oral BID Johnson-Pitts, Endia, MD   8.6 mg at 01/16/18 2101  . simvastatin (ZOCOR) tablet 20 mg  20 mg Oral QHS Johnson-Pitts, Endia, MD   20 mg at 01/16/18 2101    REVIEW OF SYSTEMS:   Constitutional: Denies fevers, chills or abnormal night sweats Eyes: Denies blurriness of vision, double vision or watery eyes Ears, nose, mouth, throat, and face: Denies mucositis or sore throat Respiratory: Denies cough, dyspnea or wheezes Cardiovascular: Denies palpitation, chest discomfort or lower extremity swelling Skin: Denies abnormal skin rashes Lymphatics: Denies new lymphadenopathy or easy bruising Neurological:Denies numbness, tingling or new weaknesses Behavioral/Psych: Mood is stable, no new changes  All other systems were reviewed with the patient and are  negative.  PHYSICAL EXAMINATION: ECOG PERFORMANCE STATUS: 2 - Symptomatic, <50% confined to bed  Vitals:   01/16/18 2056 01/17/18 0531  BP: 120/64 (!) 105/56  Pulse: 64 65  Resp: 18 18  Temp: 98.4 F (36.9 C) 98.2 F (36.8 C)  SpO2: 96% 92%   There were no vitals filed for this visit.  GENERAL:alert, no distress and comfortable SKIN: skin color, texture, turgor are normal, no rashes or significant lesions EYES: normal, conjunctiva are pink and non-injected, sclera clear OROPHARYNX:no exudate, no erythema and lips, buccal mucosa, and tongue normal  NECK: supple, thyroid normal size, non-tender, without nodularity LYMPH:  no palpable lymphadenopathy in the cervical, axillary or inguinal LUNGS: clear to auscultation and percussion with normal breathing effort HEART: regular rate & rhythm and no murmurs and no lower extremity edema ABDOMEN:abdomen soft, non-tender and normal bowel sounds Musculoskeletal:no cyanosis of digits and no clubbing  PSYCH: alert & oriented x 3 with fluent speech NEURO: no focal motor/sensory deficits  LABORATORY DATA:  I have reviewed the data as listed Lab Results  Component Value Date   WBC 9.2 01/17/2018   HGB 11.8 (L) 01/17/2018   HCT 38.3 01/17/2018   MCV 90.5 01/17/2018   PLT 456 (H) 01/17/2018   Recent Labs    01/15/18 1532 01/16/18 0051 01/16/18 0515 01/17/18 0430  NA 134*  --  135 134*  K 3.4*  --  3.8 3.8  CL 100  --  105 103  CO2 24  --  22 20*  GLUCOSE 106*  --  84 78  BUN 10  --  10 9  CREATININE 0.54 0.54 0.59 0.65  CALCIUM 8.4*  --  7.8* 7.9*  GFRNONAA >60 >60 >60 >60  GFRAA >60 >60 >60 >60  PROT 6.4*  --  5.6* 5.9*  ALBUMIN 2.8*  --  2.3* 2.6*  AST 40  --  24 25  ALT 56*  --  39 33  ALKPHOS 208*  --  156* 140*  BILITOT  0.3  --  0.2* 0.7    RADIOGRAPHIC STUDIES: I have personally reviewed the radiological images as listed and agreed with the findings in the report. Ct Chest W Contrast  Result Date:  01/15/2018 CLINICAL DATA:  Abdominal distention with no bowel movements. Radiographs demonstrated small-bowel obstruction or ileus. History of hysterectomy and appendectomy. EXAM: CT CHEST, ABDOMEN, AND PELVIS WITH CONTRAST TECHNIQUE: Multidetector CT imaging of the chest, abdomen and pelvis was performed following the standard protocol during bolus administration of intravenous contrast. CONTRAST:  172mL ISOVUE-300 IOPAMIDOL (ISOVUE-300) INJECTION 61% COMPARISON:  Radiographs of the abdomen from 01/15/2018, CT abdomen pelvis 01/06/2018 FINDINGS: CT CHEST FINDINGS Cardiovascular: Conventional branch pattern of the great vessels with mild calcific atherosclerosis. No aneurysm or dissection. The thoracic aorta is nonaneurysmal demonstrates mild atherosclerosis at the arch and along the descending portion. Heart size is top normal without pericardial effusion or thickening. No significant coronary arteriosclerosis. No large central pulmonary embolus. Mediastinum/Nodes: No enlarged mediastinal, hilar, or axillary lymph nodes. The thyroid gland is excluded on this study. Patent trachea and mainstem bronchi. Unremarkable CT appearance of the thoracic esophagus. Lungs/Pleura: Linear atelectasis and/or scarring is noted in the left lower lobe. No acute pulmonary consolidation or dominant mass. No pneumothorax is noted. Musculoskeletal: No chest wall mass or suspicious bone lesions identified. CT ABDOMEN PELVIS FINDINGS Hepatobiliary: Homogeneous enhancement of the liver. Small left hepatic lobe cyst measuring approximately 1 cm, series 2/42. The gallbladder is distended without stones possibly from a fasting state. No mural thickening or pericholecystic fluid. Small amount perihepatic ascites is identified. Slight intrahepatic ductal dilatation may be due to fullness of the gallbladder. Pancreas: Coarse calcification noted in the body of the pancreas. No acute inflammatory process, mass or ductal dilatation. Spleen: Normal  size spleen without mass. Adrenals/Urinary Tract: Normal bilateral adrenal glands and symmetric cortical enhancement of both kidneys. Delayed imaging through both kidneys demonstrate symmetric pyelograms without obstructive uropathy. The urinary bladder is physiologically distended and unremarkable for the degree of distention. Stomach/Bowel: Small hiatal hernia is identified. The stomach is nondistended. The duodenal sweep and ligament of Treitz is unremarkable. Fluid-filled distended jejunal loops to 4.1 cm are identified with transition point suggested in the right lower quadrant, series 2, image 98. Additionally there is a short segmental area of luminal narrowing with mural thickening possibly representing small stricture or involvement by neoplasm is not excluded, series 2/89 measuring up to 1.7 cm in length with single wall thickness of up to 6 mm. The transition point appears to be adjacent to the right ovarian complex cystic mass and may be adherent as previously suggested on prior report. The patient is status post appendectomy. The colon is relatively decompressed in appearance. Vascular/Lymphatic: Patent portal and splenic veins. Moderate aortic iliac atherosclerosis without aneurysm. Lymphadenopathy identified by CT size criteria. Reproductive: Complex cystic mass with septations and possible soft tissue components along the caudal aspect is identified in the right adnexa measuring 4.9 x 4 x 4.7 cm (AP by transverse by craniocaudad). Findings are concerning for possible ovarian neoplasm. Further correlation with MRI is recommended. A more simple appearing left adnexal cyst measuring 5.3 x 3.8 x 4.5 cm is identified. The patient is status post hysterectomy. Other: Small volume of ascites is noted in the upper abdomen. There appears to be rind like thickening of the peritoneum concerning for peritoneal caking, series 2/61 in the left upper quadrant in particular concerning for peritoneal carcinomatosis.  Musculoskeletal: No acute or significant osseous findings. IMPRESSION: Chest CT: 1. No active  cardiopulmonary disease. 2. Aortic atherosclerosis without aneurysm or dissection. 3. No large central pulmonary embolus. CT AP: 1. Dilated fluid-filled loops of small bowel are redemonstrated slightly more extensive than on prior exam with transition point likely in the right adnexa adjacent to a complex cystic mass concerning for ovarian neoplasm given septations and soft tissue nodularity. This raises concern for early or partial SBO. This soft tissue mass measures 4.9 x 4 x 4.7 cm and has not changed since prior recent comparison. Additional short segmental area of luminal narrowing is noted in the right lower quadrant involving small bowel for which stigmata of peritoneal carcinomatosis or small-bowel metastatic implants might account for this. 2. Redemonstration of small volume of ascites predominantly in the upper abdomen surrounding the liver and spleen. 3. Redemonstration of thick bandlike omental thickening concerning for peritoneal carcinomatosis. Electronically Signed   By: Ashley Royalty M.D.   On: 01/15/2018 19:22   Ct Abdomen Pelvis W Contrast  Result Date: 01/15/2018 CLINICAL DATA:  Abdominal distention with no bowel movements. Radiographs demonstrated small-bowel obstruction or ileus. History of hysterectomy and appendectomy. EXAM: CT CHEST, ABDOMEN, AND PELVIS WITH CONTRAST TECHNIQUE: Multidetector CT imaging of the chest, abdomen and pelvis was performed following the standard protocol during bolus administration of intravenous contrast. CONTRAST:  133mL ISOVUE-300 IOPAMIDOL (ISOVUE-300) INJECTION 61% COMPARISON:  Radiographs of the abdomen from 01/15/2018, CT abdomen pelvis 01/06/2018 FINDINGS: CT CHEST FINDINGS Cardiovascular: Conventional branch pattern of the great vessels with mild calcific atherosclerosis. No aneurysm or dissection. The thoracic aorta is nonaneurysmal demonstrates mild  atherosclerosis at the arch and along the descending portion. Heart size is top normal without pericardial effusion or thickening. No significant coronary arteriosclerosis. No large central pulmonary embolus. Mediastinum/Nodes: No enlarged mediastinal, hilar, or axillary lymph nodes. The thyroid gland is excluded on this study. Patent trachea and mainstem bronchi. Unremarkable CT appearance of the thoracic esophagus. Lungs/Pleura: Linear atelectasis and/or scarring is noted in the left lower lobe. No acute pulmonary consolidation or dominant mass. No pneumothorax is noted. Musculoskeletal: No chest wall mass or suspicious bone lesions identified. CT ABDOMEN PELVIS FINDINGS Hepatobiliary: Homogeneous enhancement of the liver. Small left hepatic lobe cyst measuring approximately 1 cm, series 2/42. The gallbladder is distended without stones possibly from a fasting state. No mural thickening or pericholecystic fluid. Small amount perihepatic ascites is identified. Slight intrahepatic ductal dilatation may be due to fullness of the gallbladder. Pancreas: Coarse calcification noted in the body of the pancreas. No acute inflammatory process, mass or ductal dilatation. Spleen: Normal size spleen without mass. Adrenals/Urinary Tract: Normal bilateral adrenal glands and symmetric cortical enhancement of both kidneys. Delayed imaging through both kidneys demonstrate symmetric pyelograms without obstructive uropathy. The urinary bladder is physiologically distended and unremarkable for the degree of distention. Stomach/Bowel: Small hiatal hernia is identified. The stomach is nondistended. The duodenal sweep and ligament of Treitz is unremarkable. Fluid-filled distended jejunal loops to 4.1 cm are identified with transition point suggested in the right lower quadrant, series 2, image 98. Additionally there is a short segmental area of luminal narrowing with mural thickening possibly representing small stricture or involvement  by neoplasm is not excluded, series 2/89 measuring up to 1.7 cm in length with single wall thickness of up to 6 mm. The transition point appears to be adjacent to the right ovarian complex cystic mass and may be adherent as previously suggested on prior report. The patient is status post appendectomy. The colon is relatively decompressed in appearance. Vascular/Lymphatic: Patent portal and splenic veins.  Moderate aortic iliac atherosclerosis without aneurysm. Lymphadenopathy identified by CT size criteria. Reproductive: Complex cystic mass with septations and possible soft tissue components along the caudal aspect is identified in the right adnexa measuring 4.9 x 4 x 4.7 cm (AP by transverse by craniocaudad). Findings are concerning for possible ovarian neoplasm. Further correlation with MRI is recommended. A more simple appearing left adnexal cyst measuring 5.3 x 3.8 x 4.5 cm is identified. The patient is status post hysterectomy. Other: Small volume of ascites is noted in the upper abdomen. There appears to be rind like thickening of the peritoneum concerning for peritoneal caking, series 2/61 in the left upper quadrant in particular concerning for peritoneal carcinomatosis. Musculoskeletal: No acute or significant osseous findings. IMPRESSION: Chest CT: 1. No active cardiopulmonary disease. 2. Aortic atherosclerosis without aneurysm or dissection. 3. No large central pulmonary embolus. CT AP: 1. Dilated fluid-filled loops of small bowel are redemonstrated slightly more extensive than on prior exam with transition point likely in the right adnexa adjacent to a complex cystic mass concerning for ovarian neoplasm given septations and soft tissue nodularity. This raises concern for early or partial SBO. This soft tissue mass measures 4.9 x 4 x 4.7 cm and has not changed since prior recent comparison. Additional short segmental area of luminal narrowing is noted in the right lower quadrant involving small bowel for  which stigmata of peritoneal carcinomatosis or small-bowel metastatic implants might account for this. 2. Redemonstration of small volume of ascites predominantly in the upper abdomen surrounding the liver and spleen. 3. Redemonstration of thick bandlike omental thickening concerning for peritoneal carcinomatosis. Electronically Signed   By: Ashley Royalty M.D.   On: 01/15/2018 19:22   Ct Abdomen Pelvis W Contrast  Result Date: 01/06/2018 CLINICAL DATA:  Acute onset of generalized abdominal pain and cramping. Abdominal bloating. EXAM: CT ABDOMEN AND PELVIS WITH CONTRAST TECHNIQUE: Multidetector CT imaging of the abdomen and pelvis was performed using the standard protocol following bolus administration of intravenous contrast. CONTRAST:  160mL ISOVUE-300 IOPAMIDOL (ISOVUE-300) INJECTION 61% COMPARISON:  CT of the abdomen performed 12/11/2006, and abdominal ultrasound performed 06/11/2008 FINDINGS: Lower chest: The visualized lung bases are grossly clear. The visualized portions of the mediastinum are unremarkable. Hepatobiliary: The liver is unremarkable in appearance. The gallbladder is decompressed and not well assessed given surrounding ascites. The common bile duct is normal in caliber. Pancreas: The pancreas is within normal limits. Spleen: The spleen is unremarkable in appearance. Adrenals/Urinary Tract: The adrenal glands are unremarkable in appearance. The kidneys are unremarkable. There is no evidence of hydronephrosis. No renal or ureteral stones are identified. No perinephric stranding is seen. Stomach/Bowel: There appears to be diffuse nodularity along the omentum at the left side of the abdomen, extending into the mesentery at the left mid abdomen, concerning for peritoneal carcinomatosis. The small bowel is diffusely filled with fluid, but remains normal in caliber. Wall thickening is noted at the distal ileum adjacent to the ovarian mass described below, and bowel loops appear somewhat adherent to  the ovarian mass. The stomach is unremarkable in appearance. The appendix is normal in caliber, without evidence of appendicitis. The colon is largely decompressed and grossly unremarkable in appearance. Vascular/Lymphatic: Scattered calcification is seen along the abdominal aorta and its branches. The abdominal aorta is otherwise grossly unremarkable. The inferior vena cava is grossly unremarkable. No retroperitoneal lymphadenopathy is seen. No pelvic sidewall lymphadenopathy is identified. Reproductive: The bladder is mildly distended and grossly unremarkable. The patient is status post hysterectomy. A multilobulated  cystic mass is noted at the right adnexa, measuring approximately 5.9 x 4.0 cm, with nodular components, concerning for primary ovarian malignancy. Two cystic lesions are noted at the left adnexa, measuring 5.8 cm and 3.2 cm, of uncertain significance. Other: Small volume ascites is noted within the abdomen and pelvis. Musculoskeletal: No acute osseous abnormalities are identified. The visualized musculature is unremarkable in appearance. IMPRESSION: 1. Complex cystic mass at the right adnexa, measuring 5.9 x 4.0 cm, with nodular components, concerning for primary ovarian malignancy. 2. Diffuse nodularity along the omentum at the left side of the abdomen, extending into the mesentery at the left mid abdomen, concerning for peritoneal carcinomatosis. 3. Wall thickening at the distal ileum adjacent to the ovarian mass; bowel loops appear somewhat adherent to the ovarian mass. Bowel infiltration with tumor cannot be excluded. No evidence of bowel obstruction at this time. 4. Small volume ascites within the abdomen and pelvis. Aortic Atherosclerosis (ICD10-I70.0). These results were called by telephone at the time of interpretation on 01/06/2018 at 2:56 am to Dr. Ezequiel Essex, who verbally acknowledged these results. Electronically Signed   By: Garald Balding M.D.   On: 01/06/2018 02:57   US  Paracentesis  Result Date: 01/15/2018 INDICATION: Patient with history of abdominal pain, complex right adnexal cystic mass, elevated CA-125, omental nodularity, ascites. Request made for diagnostic and therapeutic paracentesis. EXAM: ULTRASOUND GUIDED DIAGNOSTIC AND THERAPEUTIC PARACENTESIS MEDICATIONS: None COMPLICATIONS: None immediate. PROCEDURE: Informed written consent was obtained from the patient after a discussion of the risks, benefits and alternatives to treatment. A timeout was performed prior to the initiation of the procedure. Initial ultrasound scanning demonstrates a small amount of ascites within the right lower abdominal quadrant. The right lower abdomen was prepped and draped in the usual sterile fashion. 1% lidocaine was used for local anesthesia. Following this, a 19 gauge, 10-cm, Yueh catheter was introduced. An ultrasound image was saved for documentation purposes. The paracentesis was performed. The catheter was removed and a dressing was applied. The patient tolerated the procedure well without immediate post procedural complication. FINDINGS: A total of approximately 1.5 liters of hazy, yellow fluid was removed. Samples were sent to the laboratory as requested by the clinical team. IMPRESSION: Successful ultrasound-guided diagnostic and therapeutic paracentesis yielding 1.5 liters of peritoneal fluid. Read by: Rowe Robert, PA-C Electronically Signed   By: Jacqulynn Cadet M.D.   On: 01/15/2018 14:43   Dg Abd 2 Views  Result Date: 01/16/2018 CLINICAL DATA:  73 y/o  F; small-bowel obstruction. EXAM: ABDOMEN - 2 VIEW COMPARISON:  01/15/2017 CT abdomen and pelvis. 01/15/2017 abdomen radiographs. FINDINGS: Stable diffuse small bowel obstruction. No pneumoperitoneum. No acute osseous abnormality is evident. IMPRESSION: Stable diffuse small bowel obstruction. Electronically Signed   By: Kristine Garbe M.D.   On: 01/16/2018 16:02   Dg Abd 2 Views  Result Date: 01/15/2018 CLINICAL  DATA:  Acute generalized abdominal pain and distention. History of ovarian cancer. EXAM: ABDOMEN - 2 VIEW COMPARISON:  CT scan of January 06, 2018. FINDINGS: Mildly dilated small bowel loops are noted concerning for distal small bowel obstruction or ileus. No colonic dilatation is noted. There is no evidence of free air. No radio-opaque calculi or other significant radiographic abnormality is seen. IMPRESSION: Mildly dilated small bowel loops are noted concerning for distal small bowel obstruction or possibly ileus. Electronically Signed   By: Marijo Conception, M.D.   On: 01/15/2018 14:25

## 2018-01-17 NOTE — Progress Notes (Addendum)
Gynecologic Oncology  Subjective: Patient reports moderate abdominal pain and back pain that is relieved with IV pain medication but returns quickly when the pain medication wears off.  Ambulating without difficulty.  Reports passing NO flatus since yesterday am and no BM since then as well.  No nausea or emesis. Husband and family at the bedside. Denies chest pain, dyspnea. Questions about chemotherapy answered. No other concerns voiced.  Objective: Vital signs in last 24 hours: Temp:  [98 F (36.7 C)-98.4 F (36.9 C)] 98.2 F (36.8 C) (01/08 0531) Pulse Rate:  [57-65] 65 (01/08 0531) Resp:  [18] 18 (01/08 0531) BP: (104-120)/(56-64) 105/56 (01/08 0531) SpO2:  [92 %-96 %] 92 % (01/08 0531) Last BM Date: 01/15/18  Intake/Output from previous day: 01/07 0701 - 01/08 0700 In: 1989.1 [P.O.:240; I.V.:1749.1] Out: 1300 [Urine:1300]  Physical Examination: General: alert, cooperative and no distress Resp: clear to auscultation bilaterally Cardio: regular rate and rhythm, S1, S2 normal, no murmur, click, rub or gallop GI: abdomen slightly distended, tympanic in all quadrants, hypoactive bowel sounds Extremities: extremities normal, atraumatic, no cyanosis or edema  Labs: WBC/Hgb/Hct/Plts:  9.2/11.8/38.3/456 (01/08 0430) BUN/Cr/glu/ALT/AST/amyl/lip:  9/0.65/--/33/25/--/-- (01/08 0430)   Scans/Procedures: Abdomen 2 view from 01/16/18: IMPRESSION: Stable diffuse small bowel obstruction.  Abdomen 2 view from 01/15/18: IMPRESSION: Mildly dilated small bowel loops are noted concerning for distal small bowel obstruction or possibly ileus.  US Paracentesis 01/15/18: Successful ultrasound-guided diagnostic and therapeutic paracentesis yielding 1.5 liters of peritoneal fluid.  CT CAP 01/15/18: IMPRESSION: Chest CT: 1. No active cardiopulmonary disease. 2. Aortic atherosclerosis without aneurysm or dissection. 3. No large central pulmonary embolus.  CT AP: 1. Dilated fluid-filled loops of  small bowel are redemonstrated slightly more extensive than on prior exam with transition point likely in the right adnexa adjacent to a complex cystic mass concerning for ovarian neoplasm given septations and soft tissue nodularity. This raises concern for early or partial SBO. This soft tissue mass measures 4.9 x 4 x 4.7 cm and has not changed since prior recent comparison. Additional short segmental area of luminal narrowing is noted in the right lower quadrant involving small bowel for which stigmata of peritoneal carcinomatosis or small-bowel metastatic implants might account for this. 2. Redemonstration of small volume of ascites predominantly in the upper abdomen surrounding the liver and spleen. 3. Redemonstration of thick bandlike omental thickening concerning for peritoneal carcinomatosis.  Cytology from Paracentesis: PERITONEAL/ASCITIC FLUID (SPECIMEN 1 OF 1 COLLECTED 01/18/18): MALIGNANT CELLS CONSISTENT WITH METASTATIC ADENOCARCINOMA. SEE COMMENT. COMMENT: THE MALIGNANT CELLS ARE POSITIVE FOR MOC-31, CYTOKERATIN 7, ESTROGEN RECEPTOR, PAX-8, AND WT-1. THEY ARE NEGATIVE FOR CALRETININ, CYTOKERATIN 5/6, AND CYTOKERATIN 20. THE PROFILE IS CONSISTENT WITH A PRIMARY GYNECOLOGIC CARCINOMA. THERE IS LIKELY SUFFICIENT TUMOR PRESENT, IF ADDITIONAL STUDIES ARE REQUESTED.  Assessment: 74 y.o. female currently admitted for severe abdominal pain and at least a partial small bowel obstruction. She was recently seen in the office for a pelvic mass and carcinomatosis noted on CT scan with concerns for ovarian cancer: stable Pain:  Pain is well-controlled on PRN medications but returns quickly after medication wears off.  Heme: Hgb 11.8 and Hct 38.3 this am.   ID: WBC 9.2 this am. Blood cultures obtained in the ED with no growth. UA from ED reviewed. On Rocephin 2 G Q24H IV per Hospitalist.  No growth on paracentesis fluid from 01/15/2018.   CV: BP and HR stable. Metoprolol XL and Cozaar ordered.    GI:  Tolerating po: No: NPO. Abd 2 view 01/16/18.  GU: Voiding adequate amounts. Creatinine 0.65 this am.    FEN: No critical values this am. Ca+ 7.9. Albumin 2.6.  Prophylaxis: Lovenox ordered but on hold for IR procedure.  Plan: -Dr. Alvy Bimler to see patient later today or in the am with plans for port placement and initiation of chemotherapy during this hospitalization.  -Transfer to 6 floor per Dr. [facility] today if possible since pt NPO and lovenox on hold, per IR  -Cancel IR omental biopsy at this time since malignancy confirmed on paracentesis fluid. -Continue plan of care per Hospitalist Team, Dr. Denman George, Dr. Alvy Bimler   LOS: 1 day    Lenna Sciara D Cross 01/17/2018, 9:52 AM

## 2018-01-17 NOTE — Progress Notes (Signed)
START ON PATHWAY REGIMEN - Ovarian     A cycle is every 21 days:     Paclitaxel      Carboplatin   **Always confirm dose/schedule in your pharmacy ordering system**  Patient Characteristics: Preoperative or Nonsurgical Candidate (Clinical Staging), Newly Diagnosed, Neoadjuvant Therapy Indicated Therapeutic Status: Preoperative or Nonsurgical Candidate (Clinical Staging) BRCA Mutation Status: Awaiting Test Results AJCC T Category: cT3 AJCC 8 Stage Grouping: Unknown AJCC N Category: cN0 AJCC M Category: cM0 Therapy Plan: Neoadjuvant Therapy Indicated Intent of Therapy: Curative Intent, Discussed with Patient

## 2018-01-17 NOTE — Progress Notes (Signed)
Patient ID: Haley [name], female   DOB: [birth year], 73 y.o.   MRN: [id number]  Omental tumor and peritoneal carcinomatosis with SBO  S/p RT IJ POWER PORT  Tip svcra No comp Stable ebl min Ready for use

## 2018-01-17 NOTE — Progress Notes (Signed)
  PROGRESS NOTE  Haley [name] GYK:[id number] DOB: [birth year] DOA: 01/15/2018 PCP: [place], Taqwa G, MD  Brief History   73 year old woman diagnosed with ovarian cancer December 2019 presented with abdominal pain, admitted for SBO, possible SBP.  A & P  Partial small bowel obstruction secondary to metastatic ovarian cancer with peritoneal carcinomatosis --appears stable currently.  Continue management as per general surgery, GYN oncology --GYN oncology has canceled peritoneal biopsy --Await further evaluation by oncology.  It appears that consideration is being given to port placement and initiation of chemotherapy during this hospitalization.  Question of SBP on admission --Continue to doubt this.  Initial studies not significantly suggestive.  Culture no growth.  Continue ceftriaxone today but if culture remains negative tomorrow will discontinue.    Hypothyroidism.   --Continue levothyroxine     DVT prophylaxis: SCDs Code Status: Full Family Communication: Multiple family members at bedside Disposition Plan: home    Murray Hodgkins, MD  [facility] Direct contact: [phone] --[place]  --[email or web address]; password [email or web address]  7PM-7AM contact night coverage as above 01/17/2018, 10:12 AM  LOS: 1 day   Consultants  . IR . General surgery . GYN oncology  Procedures  . 1/6 diagnostic and therapeutic paracentesis 1.5 L hazy yellow fluid  Antibiotics  .   Interval History/Subjective  Abdominal pain seems to be fairly well controlled.  No flatus or bowel movement.  Objective   Vitals:  Vitals:   01/16/18 2056 01/17/18 0531  BP: 120/64 (!) 105/56  Pulse: 64 65  Resp: 18 18  Temp: 98.4 F (36.9 C) 98.2 F (36.8 C)  SpO2: 96% 92%    Exam: Constitutional:   . Appears calm, uncomfortable, nontoxic Respiratory:  . CTA bilaterally, no w/r/r.  . Respiratory effort normal.  Cardiovascular:  . RRR, no m/r/g . No LE extremity edema   Abdomen:  . Soft,  nontender Psychiatric:  . Mental status o Mood, affect appropriate  I have personally reviewed the following:   Today's Data  . CMP unremarkable. . CBC unremarkable.  Hemoglobin stable 11.8.  Lab Data  .   Micro Data since admission  . Urine culture, blood cultures pending  Imaging since admission  . CT chest no active disease . CT abdomen pelvis noted showed dilated fluid-filled loops of small bowel concerning for early or partial SBO.  Cardiology Data  .   Other Data  .   Scheduled Meds: . fluticasone  2 spray Each Nare Daily  . levothyroxine  75 mcg Oral Daily  . losartan  25 mg Oral Daily  . metoprolol succinate  25 mg Oral Daily  . pantoprazole  40 mg Oral Daily  . senna  1 tablet Oral BID  . simvastatin  20 mg Oral QHS   Continuous Infusions: . sodium chloride    . sodium chloride 75 mL/hr at 01/16/18 2156  . cefTRIAXone (ROCEPHIN)  IV 2 g (01/17/18 0112)    Principal Problem:   Abdominal pain Active Problems:   Hypothyroidism   GERD   Carcinomatosis ([place])   Ovarian cancer ([place]) suspected   SBO (small bowel obstruction) ([facility]) partial vs early on CT   LOS: 1 day

## 2018-01-17 NOTE — Final Consult Note (Signed)
Consultant Final Sign-Off Note    Assessment/Final recommendations  Haley [name] is a 73 y.o. female with a history of ovarian cancer and peritoneal carcinomatosis followed by me for partial small bowel obstruction.   Surgical team was consulted for a early or partial small bowel obstruction.  Her CT scan showed dilated small bowel and some areas of concern for narrowing/transition at the right adnexa adjacent to a complex cystic mass that was concerning for ovarian neoplasm.  Since that time patient's pain has improved.  She currently denies abdominal pain.  She passed flatus and small bowel movement yesterday.  No nausea or vomiting.  Tolerating ice chips.  Surgery will sign off at this time.  No plans for acute operative interventions. Suspect patients CT scan findings may be chronic and 2/2 neoplasm. Patient can be advanced to liquids from our standpoint. However it appears patient is NPO as GYN oncology plan for port placement and initiation of chemotherapy later today.    Wound care (if applicable):    Diet at discharge: per primary team   Activity at discharge: per primary team   Follow-up appointment:  As needed   Pending results:  Unresulted Labs (From admission, onward)   None       Medication recommendations:    Other recommendations:    Thank you for allowing [place] to participate in the care of your patient!  Please consult [place] again if you have further needs for your patient.  Barth Kirks [facility] 01/17/2018 10:04 AM    Subjective  Pleasant this morning. Reports some low back pain radiating into her lower abdomen overnight. No pain currently. Denies any N/V. Small BM and passed flatus yesterday. Family at bedside.   Objective  Vital signs in last 24 hours: Temp:  [98 F (36.7 C)-98.4 F (36.9 C)] 98.2 F (36.8 C) (01/08 0531) Pulse Rate:  [57-65] 65 (01/08 0531) Resp:  [18] 18 (01/08 0531) BP: (104-120)/(56-64) 105/56 (01/08 0531) SpO2:  [92 %-96 %] 92 %  (01/08 0531)  General: Abd: Soft, nontender, nondistended. + Bowel sounds.   Pertinent labs and Studies: Recent Labs    01/16/18 0051 01/16/18 0515 01/17/18 0430  WBC 8.4 6.0 9.2  HGB 12.4 10.8* 11.8*  HCT 38.8 34.4* 38.3   BMET Recent Labs    01/16/18 0515 01/17/18 0430  NA 135 134*  K 3.8 3.8  CL 105 103  CO2 22 20*  GLUCOSE 84 78  BUN 10 9  CREATININE 0.59 0.65  CALCIUM 7.8* 7.9*   No results for input(s): LABURIN in the last 72 hours. Results for orders placed or performed during the hospital encounter of 01/15/18  Culture, body fluid-bottle     Status: None (Preliminary result)   Collection Time: 01/15/18  2:35 PM  Result Value Ref Range Status   Specimen Description FLUID PERITONEAL  Final   Special Requests BOTTLES DRAWN AEROBIC AND ANAEROBIC  Final   Culture   Final    NO GROWTH 2 DAYS Performed at [facility], [place] [address], [place], [place] [address]    Report Status PENDING  Incomplete  Gram stain     Status: None   Collection Time: 01/15/18  2:35 PM  Result Value Ref Range Status   Specimen Description FLUID PERITONEAL  Final   Special Requests NONE  Final   Gram Stain   Final    WBC PRESENT,BOTH PMN AND MONONUCLEAR NO ORGANISMS SEEN CYTOSPIN SMEAR Performed at [facility], [address] [place], [place] [address]    Report Status 01/15/2018 FINAL  Final  Culture, blood (routine x 2)     Status: None (Preliminary result)   Collection Time: 01/15/18  4:39 PM  Result Value Ref Range Status   Specimen Description   Final    BLOOD LEFT WRIST Performed at [facility] [address], [place], [place] [address]    Special Requests   Final    BOTTLES DRAWN AEROBIC AND ANAEROBIC Blood Culture adequate volume Performed at [facility] [address], [place], [place] [address]    Culture   Final    NO GROWTH 2 DAYS Performed at [facility] [address], [place], [place] [address]     Report Status PENDING  Incomplete  Culture, blood (routine x 2)     Status: None (Preliminary result)   Collection Time: 01/15/18  4:50 PM  Result Value Ref Range Status   Specimen Description   Final    BLOOD LEFT ANTECUBITAL Performed at [facility] [address] [place], [place] [address]    Special Requests   Final    BOTTLES DRAWN AEROBIC ONLY Blood Culture adequate volume Performed at [facility] [address] [place], [place] [address]    Culture   Final    NO GROWTH 2 DAYS Performed at [facility] [address] [place], [place] [address]    Report Status PENDING  Incomplete    Imaging: Dg Abd 2 Views  Result Date: 01/16/2018 CLINICAL DATA:  73 y/o  F; small-bowel obstruction. EXAM: ABDOMEN - 2 VIEW COMPARISON:  01/15/2017 CT abdomen and pelvis. 01/15/2017 abdomen radiographs. FINDINGS: Stable diffuse small bowel obstruction. No pneumoperitoneum. No acute osseous abnormality is evident. IMPRESSION: Stable diffuse small bowel obstruction. Electronically Signed   By: Kristine Garbe M.D.   On: 01/16/2018 16:02

## 2018-01-17 NOTE — Progress Notes (Signed)
Pt. arrived to floor from surgery, alert and oriented x 4 no respiratory distress noted.

## 2018-01-18 ENCOUNTER — Ambulatory Visit (HOSPITAL_COMMUNITY): Admission: RE | Admit: 2018-01-18 | Payer: PPO | Source: Ambulatory Visit

## 2018-01-18 ENCOUNTER — Encounter: Payer: PPO | Admitting: Physical Therapy

## 2018-01-18 ENCOUNTER — Telehealth: Payer: Self-pay | Admitting: Oncology

## 2018-01-18 DIAGNOSIS — R188 Other ascites: Secondary | ICD-10-CM

## 2018-01-18 DIAGNOSIS — K56600 Partial intestinal obstruction, unspecified as to cause: Secondary | ICD-10-CM

## 2018-01-18 MED ORDER — EPINEPHRINE PF 1 MG/ML IJ SOLN
0.5000 mg | Freq: Once | INTRAMUSCULAR | Status: DC | PRN
  Filled 2018-01-18: qty 1

## 2018-01-18 MED ORDER — DIPHENHYDRAMINE HCL 50 MG/ML IJ SOLN: 50.0000 mg | Freq: Once | INTRAMUSCULAR | Status: DC | PRN

## 2018-01-18 MED ORDER — SODIUM CHLORIDE 0.9 % IV SOLN
175.0000 mg/m2 | Freq: Once | INTRAVENOUS | Status: AC
  Administered 2018-01-18: 300 mg via INTRAVENOUS
  Filled 2018-01-18: qty 50

## 2018-01-18 MED ORDER — FAMOTIDINE IN NACL 20-0.9 MG/50ML-% IV SOLN
20.0000 mg | Freq: Once | INTRAVENOUS | Status: AC
  Administered 2018-01-18: 20 mg via INTRAVENOUS
  Filled 2018-01-18: qty 50

## 2018-01-18 MED ORDER — EPINEPHRINE PF 1 MG/10ML IJ SOSY: 0.2500 mg | PREFILLED_SYRINGE | Freq: Once | INTRAMUSCULAR | Status: DC | PRN

## 2018-01-18 MED ORDER — ALBUTEROL SULFATE (2.5 MG/3ML) 0.083% IN NEBU: 2.5000 mg | INHALATION_SOLUTION | Freq: Once | RESPIRATORY_TRACT | Status: DC | PRN

## 2018-01-18 MED ORDER — SODIUM CHLORIDE 0.9 % IV SOLN
Freq: Once | INTRAVENOUS | Status: AC
  Administered 2018-01-18: 13:00:00 via INTRAVENOUS
  Filled 2018-01-18: qty 5

## 2018-01-18 MED ORDER — FAMOTIDINE IN NACL 20-0.9 MG/50ML-% IV SOLN: 20.0000 mg | Freq: Once | INTRAVENOUS | Status: DC | PRN

## 2018-01-18 MED ORDER — COLD PACK MISC ONCOLOGY
1.0000 | Freq: Once | Status: AC | PRN
  Filled 2018-01-18: qty 1

## 2018-01-18 MED ORDER — DIPHENHYDRAMINE HCL 50 MG/ML IJ SOLN
50.0000 mg | Freq: Once | INTRAMUSCULAR | Status: AC
  Administered 2018-01-18: 50 mg via INTRAVENOUS
  Filled 2018-01-18: qty 1

## 2018-01-18 MED ORDER — PALONOSETRON HCL INJECTION 0.25 MG/5ML
0.2500 mg | Freq: Once | INTRAVENOUS | Status: AC
  Administered 2018-01-18: 0.25 mg via INTRAVENOUS
  Filled 2018-01-18: qty 5

## 2018-01-18 MED ORDER — SODIUM CHLORIDE 0.9 % IV SOLN
462.0000 mg | Freq: Once | INTRAVENOUS | Status: AC
  Administered 2018-01-18: 460 mg via INTRAVENOUS
  Filled 2018-01-18: qty 46

## 2018-01-18 MED ORDER — PROCHLORPERAZINE MALEATE 10 MG PO TABS
10.0000 mg | ORAL_TABLET | Freq: Four times a day (QID) | ORAL | Status: DC | PRN
  Administered 2018-01-30: 10 mg via ORAL
  Filled 2018-01-18: qty 1

## 2018-01-18 MED ORDER — METHYLPREDNISOLONE SODIUM SUCC 125 MG IJ SOLR: 125.0000 mg | Freq: Once | INTRAMUSCULAR | Status: DC | PRN

## 2018-01-18 MED ORDER — DIPHENHYDRAMINE HCL 50 MG/ML IJ SOLN: 25.0000 mg | Freq: Once | INTRAMUSCULAR | Status: DC | PRN

## 2018-01-18 MED ORDER — SODIUM CHLORIDE 0.9 % IV SOLN: Freq: Once | INTRAVENOUS | Status: DC | PRN

## 2018-01-18 MED ORDER — SODIUM CHLORIDE 0.9 % IV SOLN: Freq: Once | INTRAVENOUS | Status: DC

## 2018-01-18 NOTE — Progress Notes (Signed)
Chemotherapy teaching provided to patient. Handouts on Carbo and Taxol given. Side effects discussed. Chemotherapy and you book provided to patient as well as chemo notebook. Consent signed and placed in chart. Pt encouraged to ask questions as they arise. Will continue to monitor.

## 2018-01-18 NOTE — Telephone Encounter (Signed)
Lasean called and said she is still NPO and is wondering if Dr. Alvy Bimler can change her diet.  Called her back and she said she is now on a clear liquid diet.

## 2018-01-18 NOTE — Progress Notes (Signed)
Visited briefly with patient to see how her chemo infusion went and check on her bowel function. She denies any nausea/emesis but states she has had no BM/flatus since I saw her last on 01/15/18.  Vitals:   01/18/18 1558 01/18/18 2025  BP: 133/69 127/83  Pulse:  (!) 108  Resp:  18  Temp: 98.2 F (36.8 C) 98.2 F (36.8 C)  SpO2: 95% 97%   CBC    Component Value Date/Time   WBC 9.2 01/17/2018 0430   RBC 4.23 01/17/2018 0430   HGB 11.8 (L) 01/17/2018 0430   HCT 38.3 01/17/2018 0430   PLT 456 (H) 01/17/2018 0430   MCV 90.5 01/17/2018 0430   MCH 27.9 01/17/2018 0430   MCHC 30.8 01/17/2018 0430   RDW 13.5 01/17/2018 0430   LYMPHSABS 1.4 01/17/2018 0430   MONOABS 1.0 01/17/2018 0430   EOSABS 0.2 01/17/2018 0430   BASOSABS 0.0 01/17/2018 0430   BMP Latest Ref Rng & Units 01/17/2018 01/16/2018 01/16/2018  Glucose 70 - 99 mg/dL 78 84 -  BUN 8 - 23 mg/dL 9 10 -  Creatinine 0.44 - 1.00 mg/dL 0.65 0.59 0.54  Sodium 135 - 145 mmol/L 134(L) 135 -  Potassium 3.5 - 5.1 mmol/L 3.8 3.8 -  Chloride 98 - 111 mmol/L 103 105 -  CO2 22 - 32 mmol/L 20(L) 22 -  Calcium 8.9 - 10.3 mg/dL 7.9(L) 7.8(L) -    Abdomen is soft, mildly distended, NT. Ext SCD in place  A/P Continue to monitor for return of bowel function. Consider restart of anticoagulation - will discuss with Dr. Alvy Bimler in am. Reviewed with the patient why she may be having pain and no bowel function. Explained goal of therapy (as previously reviewed with her by Dr. Alvy Bimler) is to shrink the tumor and hopefully that will relieve some of her symptoms.

## 2018-01-18 NOTE — Progress Notes (Signed)
MD would like to give Dex 20mg  today. Dex 12mg  discontinued.   Hardie Pulley, PharmD, BCPS, BCOP

## 2018-01-18 NOTE — Progress Notes (Signed)
PROGRESS NOTE  Haley [name] [id number]:[id number] DOB: 10/24/1945 DOA: 01/15/2018 PCP: [place], Stasha G, MD  Brief History   73 year old woman diagnosed with ovarian cancer December 2019 presented with abdominal pain, admitted for SBO, possible SBP.  A & P  Partial small bowel obstruction secondary to metastatic ovarian cancer with peritoneal carcinomatosis.  Status post port placement 1/8. --seems to be improving, pain controlled.  Per general surgery will advance to liquids and monitor. --Definitive management per GYN oncology and oncology.  Start chemotherapy today.  Question of SBP on admission --Blood culture and peritoneal culture remain no growth.  Will stop antibiotics.  SBP highly doubted.  Hypothyroidism.   --Continue levothyroxine   I discussed with Dr. Alvy Bimler yesterday and was updated on plan.  Management as as per GYN oncology and oncology.  Can go home when chemotherapy complete and tolerating diet.  Expected to be several days yet.  DVT prophylaxis: SCDs Code Status: Full Family Communication: Husband at bedside Disposition Plan: home    Murray Hodgkins, MD  [facility] Direct contact: [phone] --Via [place]  --[email or web address]; password [email or web address]  7PM-7AM contact night coverage as above 01/18/2018, 3:02 PM  LOS: 2 days   Consultants  . IR . General surgery . GYN oncology  Procedures  . 1/6 diagnostic and therapeutic paracentesis 1.5 L hazy yellow fluid  1/8 RIGHT INTERNAL JUGULAR SINGLE LUMEN POWER PORT CATHETER INSERTION Antibiotics     Interval History/Subjective  Feels better today.  Abdominal pain controlled with medication.  Some gas.  No bowel movement.  Drinking liquids.  Objective   Vitals:  Vitals:   01/18/18 0613 01/18/18 1419  BP: (!) 106/59 139/73  Pulse: 70 66  Resp: 17 18  Temp: 98.7 F (37.1 C) 97.8 F (36.6 C)  SpO2: 97% 97%    Exam: Constitutional:   . Appears calm and comfortable Respiratory:  . CTA bilaterally,  no w/r/r.  . Respiratory effort normal.  Cardiovascular:  . RRR, no m/r/g . No LE extremity edema   Abdomen:  . Soft, distended Psychiatric:  . Mental status o Mood, affect appropriate  I have personally reviewed the following:   Today's Data  .   Lab Data  .   Micro Data since admission  . Urine culture, blood cultures pending  Imaging since admission  . CT chest no active disease . CT abdomen pelvis noted showed dilated fluid-filled loops of small bowel concerning for early or partial SBO.  Cardiology Data  .   Other Data  .   Scheduled Meds: . CARBOplatin  460 mg Intravenous Once  . fluticasone  2 spray Each Nare Daily  . levothyroxine  75 mcg Oral Daily  . losartan  25 mg Oral Daily  . metoprolol succinate  25 mg Oral Daily  . PACLitaxel  175 mg/m2 (Treatment Plan Recorded) Intravenous Once  . pantoprazole  40 mg Oral Daily  . senna  1 tablet Oral BID  . simvastatin  20 mg Oral QHS   Continuous Infusions: . sodium chloride 75 mL/hr at 01/18/18 0747  . sodium chloride    . sodium chloride    . cefTRIAXone (ROCEPHIN)  IV Stopped (01/18/18 0120)  . famotidine (PEPCID) IV      Principal Problem:   Partial small bowel obstruction (HCC) Active Problems:   Hypothyroidism   GERD   Carcinomatosis ([place])   Abdominal pain   Ovarian cancer ([place]) suspected   LOS: 2 days

## 2018-01-18 NOTE — Progress Notes (Signed)
Haley [name]   DOB:[birth year]   BZ#:[id number]    Assessment & Plan:  Locally advanced cancer with carcinomatosis, suspicious for ovarian cancer The patient will not get better without treatment Due to her weakened state and poor performance status, upfront surgery is not ideal We discussed the role of neoadjuvant chemotherapy approach with carboplatin and Taxol She will proceed with cycle 1, day 1 of carboplatin and Taxol today. I will not prescribe prophylactic G-CSF support  Nausea, vomiting, bloating and constipation Her symptoms are most consistent with carcinomatosis/partial small bowel obstruction Continue conservative management I am hopeful with treatment, her symptoms will resolve within the next few days I recommend a trial of clear liquid diet  Malignant ascites status post therapeutic paracentesis on January 15, 2018 She still have presence of ascites but at present time, I do not believe she needs another therapeutic paracentesis  Severe protein calorie malnutrition Hopefully, she can start eating once her chemotherapy start working  Discharge planning Not ready for discharge due to unresolved bowel obstructive symptoms  Heath Lark, MD 01/18/2018  7:57 AM   Subjective:  She is seen this morning in the presence of her husband.  Port was placed yesterday, uneventful.  She still complains of a lot of abdominal pain.  No flatus for 2 days, no bowel movement for 3 days or more She denies nausea.  Objective:  Vitals:   01/17/18 2235 01/18/18 0613  BP: 117/60 (!) 106/59  Pulse: 82 70  Resp: 18 17  Temp: 99 F (37.2 C) 98.7 F (37.1 C)  SpO2: 95% 97%     Intake/Output Summary (Last 24 hours) at 01/18/2018 0757 Last data filed at 01/18/2018 1017 Gross per 24 hour  Intake 1466.62 ml  Output 200 ml  Net 1266.62 ml    GENERAL:alert, no distress and comfortable Musculoskeletal:no cyanosis of digits and no clubbing  NEURO: alert & oriented x 3 with fluent speech, no  focal motor/sensory deficits   Labs:  Lab Results  Component Value Date   WBC 9.2 01/17/2018   HGB 11.8 (L) 01/17/2018   HCT 38.3 01/17/2018   MCV 90.5 01/17/2018   PLT 456 (H) 01/17/2018   NEUTROABS 6.5 01/17/2018    Lab Results  Component Value Date   NA 134 (L) 01/17/2018   K 3.8 01/17/2018   CL 103 01/17/2018   CO2 20 (L) 01/17/2018    Studies:  Dg Abd 2 Views  Result Date: 01/17/2018 CLINICAL DATA:  Partial small bowel obstruction EXAM: ABDOMEN - 2 VIEW COMPARISON:  01/16/2018 FINDINGS: Dilated small bowel again noted with air-fluid levels compatible with small bowel obstruction. No real change. No organomegaly or free air. IMPRESSION: Continued small bowel obstruction pattern, unchanged. Electronically Signed   By: Rolm Baptise M.D.   On: 01/17/2018 11:26   Dg Abd 2 Views  Result Date: 01/16/2018 CLINICAL DATA:  73 y/o  F; small-bowel obstruction. EXAM: ABDOMEN - 2 VIEW COMPARISON:  01/15/2017 CT abdomen and pelvis. 01/15/2017 abdomen radiographs. FINDINGS: Stable diffuse small bowel obstruction. No pneumoperitoneum. No acute osseous abnormality is evident. IMPRESSION: Stable diffuse small bowel obstruction. Electronically Signed   By: Kristine Garbe M.D.   On: 01/16/2018 16:02   Ir Imaging Guided Port Insertion  Result Date: 01/17/2018 CLINICAL DATA:  OVARIAN CARCINOMA, PERITONEAL CARCINOMATOSIS, SMALL-BOWEL OBSTRUCTION EXAM: RIGHT INTERNAL JUGULAR SINGLE LUMEN POWER PORT CATHETER INSERTION Date:  01/17/2018 01/17/2018 1:28 pm Radiologist:  Jerilynn Mages. Daryll Brod, MD Guidance:  Ultrasound fluoroscopic MEDICATIONS: Ancef 2 g; The antibiotic was  administered within an appropriate time interval prior to skin puncture. ANESTHESIA/SEDATION: Versed 1.0 mg IV; Fentanyl 50 mcg IV; Moderate Sedation Time:  20 minutes The patient was continuously monitored during the procedure by the interventional radiology nurse under my direct supervision. FLUOROSCOPY TIME:  0 minutes, 18 seconds (3 mGy)  COMPLICATIONS: None immediate. CONTRAST:  None PROCEDURE: Informed consent was obtained from the patient following explanation of the procedure, risks, benefits and alternatives. The patient understands, agrees and consents for the procedure. All questions were addressed. A time out was performed. Maximal barrier sterile technique utilized including caps, mask, sterile gowns, sterile gloves, large sterile drape, hand hygiene, and 2% chlorhexidine scrub. Under sterile conditions and local anesthesia, right internal jugular micropuncture venous access was performed. Access was performed with ultrasound. Images were obtained for documentation of the patent right internal jugular vein. A guide wire was inserted followed by a transitional dilator. This allowed insertion of a guide wire and catheter into the IVC. Measurements were obtained from the SVC / RA junction back to the right IJ venotomy site. In the right infraclavicular chest, a subcutaneous pocket was created over the second anterior rib. This was done under sterile conditions and local anesthesia. 1% lidocaine with epinephrine was utilized for this. A 2.5 cm incision was made in the skin. Blunt dissection was performed to create a subcutaneous pocket over the right pectoralis major muscle. The pocket was flushed with saline vigorously. There was adequate hemostasis. The port catheter was assembled and checked for leakage. The port catheter was secured in the pocket with two retention sutures. The tubing was tunneled subcutaneously to the right venotomy site and inserted into the SVC/RA junction through a valved peel-away sheath. Position was confirmed with fluoroscopy. Images were obtained for documentation. The patient tolerated the procedure well. No immediate complications. Incisions were closed in a two layer fashion with 4 - 0 Vicryl suture. Dermabond was applied to the skin. The port catheter was accessed, blood was aspirated followed by saline and  heparin flushes. Needle was removed. A dry sterile dressing was applied. IMPRESSION: Ultrasound and fluoroscopically guided right internal jugular single lumen power port catheter insertion. Tip in the SVC/RA junction. Catheter ready for use. Electronically Signed   By: Jerilynn Mages.  Shick M.D.   On: 01/17/2018 13:32

## 2018-01-18 NOTE — Progress Notes (Signed)
BSA and Chemo dosages and calculations verified by 2 chemo RNs

## 2018-01-19 ENCOUNTER — Telehealth: Payer: Self-pay | Admitting: *Deleted

## 2018-01-19 ENCOUNTER — Inpatient Hospital Stay: Payer: PPO | Admitting: Hematology and Oncology

## 2018-01-19 LAB — BASIC METABOLIC PANEL
Anion gap: 7 (ref 5–15)
Anion gap: 8 (ref 5–15)
BUN: 10 mg/dL (ref 8–23)
BUN: 10 mg/dL (ref 8–23)
CO2: 22 mmol/L (ref 22–32)
CO2: 21 mmol/L — AB (ref 22–32)
Calcium: 7.7 mg/dL — ABNORMAL LOW (ref 8.9–10.3)
Calcium: 7.9 mg/dL — ABNORMAL LOW (ref 8.9–10.3)
Chloride: 107 mmol/L (ref 98–111)
Chloride: 107 mmol/L (ref 98–111)
Creatinine, Ser: 0.54 mg/dL (ref 0.44–1.00)
Creatinine, Ser: 0.73 mg/dL (ref 0.44–1.00)
GFR calc Af Amer: >60 mL/min (ref >60)
GFR calc Af Amer: >60 mL/min (ref >60)
GFR calc non Af Amer: >60 mL/min (ref >60)
GFR calc non Af Amer: >60 mL/min (ref >60)
Glucose, Bld: 174 mg/dL — ABNORMAL HIGH (ref 70–99)
Glucose, Bld: 205 mg/dL — ABNORMAL HIGH (ref 70–99)
Potassium: 3.6 mmol/L (ref 3.5–5.1)
Potassium: 3.8 mmol/L (ref 3.5–5.1)
Sodium: 136 mmol/L (ref 135–145)
Sodium: 136 mmol/L (ref 135–145)

## 2018-01-19 MED ORDER — HYDROCORTISONE 2.5 % RE CREA
TOPICAL_CREAM | Freq: Two times a day (BID) | RECTAL | Status: DC
  Administered 2018-01-20 – 2018-02-01 (×15): via RECTAL
  Filled 2018-01-19 (×2): qty 28.35

## 2018-01-19 MED ORDER — ENOXAPARIN SODIUM 40 MG/0.4ML ~~LOC~~ SOLN
40.0000 mg | SUBCUTANEOUS | Status: DC
  Administered 2018-01-19 – 2018-01-22 (×2): 40 mg via SUBCUTANEOUS
  Filled 2018-01-19 (×4): qty 0.4

## 2018-01-19 MED ORDER — BISACODYL 10 MG RE SUPP
10.0000 mg | Freq: Once | RECTAL | Status: AC
  Administered 2018-01-19: 10 mg via RECTAL
  Filled 2018-01-19: qty 1

## 2018-01-19 NOTE — Progress Notes (Signed)
Haley [name]   DOB:October 05, 1945   YP#:[id number]    Assessment & Plan:   Locally advanced cancer with carcinomatosis, suspicious for ovarian cancer She tolerated cycle 1 day 1 of carboplatin and Taxol on January 18, 2018.  Hopefully, her symptoms of carcinomatosis will improve. Continue supportive care.  Nausea, vomiting, bloating and constipation Her symptoms are most consistent with carcinomatosis/partial small bowel obstruction Continue conservative management I am hopeful with treatment, her symptoms will resolve within the next few days I recommend a trial of clear liquid diet If she has passage of bowel movement or flatus, advance diet as tolerated  Malignant ascites status post therapeutic paracentesis on January 15, 2018 She still have presence of ascites but at present time, I do not believe she needs another therapeutic paracentesis for now  Severe protein calorie malnutrition Hopefully, she can start eating once her chemotherapy start working  Discharge planning Not ready for discharge due to unresolved bowel obstructive symptoms Hopefully, she can be discharged over the weekend.  If so, I will set her outpatient follow-up.  If she is still here by Monday, I will return to check on her.  Please call consult service over the weekend if questions arise  Heath Lark, MD 01/19/2018  7:50 AM   Subjective:  She feels well.  Tolerated chemotherapy well.  She continues to have pain.  No nausea.  No bowel movement for 4 days.  No passage of flatus for 3 days.  She tolerated clear liquid diet  Objective:  Vitals:   01/19/18 0337 01/19/18 0557  BP:  130/73  Pulse: (!) 108 (!) 104  Resp:  17  Temp:  98.1 F (36.7 C)  SpO2: 98% 99%     Intake/Output Summary (Last 24 hours) at 01/19/2018 0750 Last data filed at 01/18/2018 1600 Gross per 24 hour  Intake 1115.09 ml  Output -  Net 1115.09 ml    GENERAL:alert, no distress and comfortable ABDOMEN:distended Musculoskeletal:no  cyanosis of digits and no clubbing  NEURO: alert & oriented x 3 with fluent speech, no focal motor/sensory deficits   Labs:  Lab Results  Component Value Date   WBC 9.2 01/17/2018   HGB 11.8 (L) 01/17/2018   HCT 38.3 01/17/2018   MCV 90.5 01/17/2018   PLT 456 (H) 01/17/2018   NEUTROABS 6.5 01/17/2018    Lab Results  Component Value Date   NA 136 01/19/2018   K 3.6 01/19/2018   CL 107 01/19/2018   CO2 22 01/19/2018    Studies:  Dg Abd 2 Views  Result Date: 01/17/2018 CLINICAL DATA:  Partial small bowel obstruction EXAM: ABDOMEN - 2 VIEW COMPARISON:  01/16/2018 FINDINGS: Dilated small bowel again noted with air-fluid levels compatible with small bowel obstruction. No real change. No organomegaly or free air. IMPRESSION: Continued small bowel obstruction pattern, unchanged. Electronically Signed   By: Rolm Baptise M.D.   On: 01/17/2018 11:26   Ir Imaging Guided Port Insertion  Result Date: 01/17/2018 CLINICAL DATA:  OVARIAN CARCINOMA, PERITONEAL CARCINOMATOSIS, SMALL-BOWEL OBSTRUCTION EXAM: RIGHT INTERNAL JUGULAR SINGLE LUMEN POWER PORT CATHETER INSERTION Date:  01/17/2018 01/17/2018 1:28 pm Radiologist:  Jerilynn Mages. Daryll Brod, MD Guidance:  Ultrasound fluoroscopic MEDICATIONS: Ancef 2 g; The antibiotic was administered within an appropriate time interval prior to skin puncture. ANESTHESIA/SEDATION: Versed 1.0 mg IV; Fentanyl 50 mcg IV; Moderate Sedation Time:  20 minutes The patient was continuously monitored during the procedure by the interventional radiology nurse under my direct supervision. FLUOROSCOPY TIME:  0 minutes, 18 seconds (  3 mGy) COMPLICATIONS: None immediate. CONTRAST:  None PROCEDURE: Informed consent was obtained from the patient following explanation of the procedure, risks, benefits and alternatives. The patient understands, agrees and consents for the procedure. All questions were addressed. A time out was performed. Maximal barrier sterile technique utilized including caps, mask,  sterile gowns, sterile gloves, large sterile drape, hand hygiene, and 2% chlorhexidine scrub. Under sterile conditions and local anesthesia, right internal jugular micropuncture venous access was performed. Access was performed with ultrasound. Images were obtained for documentation of the patent right internal jugular vein. A guide wire was inserted followed by a transitional dilator. This allowed insertion of a guide wire and catheter into the IVC. Measurements were obtained from the SVC / RA junction back to the right IJ venotomy site. In the right infraclavicular chest, a subcutaneous pocket was created over the second anterior rib. This was done under sterile conditions and local anesthesia. 1% lidocaine with epinephrine was utilized for this. A 2.5 cm incision was made in the skin. Blunt dissection was performed to create a subcutaneous pocket over the right pectoralis major muscle. The pocket was flushed with saline vigorously. There was adequate hemostasis. The port catheter was assembled and checked for leakage. The port catheter was secured in the pocket with two retention sutures. The tubing was tunneled subcutaneously to the right venotomy site and inserted into the SVC/RA junction through a valved peel-away sheath. Position was confirmed with fluoroscopy. Images were obtained for documentation. The patient tolerated the procedure well. No immediate complications. Incisions were closed in a two layer fashion with 4 - 0 Vicryl suture. Dermabond was applied to the skin. The port catheter was accessed, blood was aspirated followed by saline and heparin flushes. Needle was removed. A dry sterile dressing was applied. IMPRESSION: Ultrasound and fluoroscopically guided right internal jugular single lumen power port catheter insertion. Tip in the SVC/RA junction. Catheter ready for use. Electronically Signed   By: Jerilynn Mages.  Shick M.D.   On: 01/17/2018 13:32

## 2018-01-19 NOTE — Telephone Encounter (Signed)
The patient's daughter called and left a message stating "I wanted to leave a message for Dr. Gerarda Fraction regarding my mother. I wanted to let her know I would be able to answer any questions/concerns she has regarding my mother but to please not ask me them in front of her. IT makes her very mad/ill. She yells at me afterwards. So please just ask me outside/away from her." Message forwarded to Dr. Gerarda Fraction

## 2018-01-19 NOTE — Progress Notes (Signed)
PROGRESS NOTE  JANET HUMPHREYS YKD:[id number] DOB: 1945/03/24 DOA: 01/15/2018 PCP: [place], Alexianna G, MD  Brief History   73 year old woman diagnosed with ovarian cancer December 2019 presented with abdominal pain, admitted for SBO, possible SBP.  A & P  Partial small bowel obstruction secondary to metastatic ovarian cancer with peritoneal carcinomatosis.  Status post port placement 1/8.  Started chemotherapy 1/9. --Continue chemotherapy as per oncology. --Still no flatus or bowel movement.  Will continue clear liquids as the patient is asymptomatic but not advance diet until bowel movement or flatus passed. --The patient is argumentative and does not seem to understand why diet cannot be advanced, I have explained this to her repeatedly.. --Continue definitive management per GYN oncology and oncology.  Start chemotherapy today.  Paroxysmal atrial fibrillation.  EKG obtained last night showed atrial fibrillation with RVR.  The patient is asymptomatic today. --Known history of this, followed by Dr. Claiborne Billings, treated with metoprolol.  I have reviewed his last office note from September 2019, I do not see any discussion regarding anticoagulation.  I think this can be deferred to the outpatient setting on follow-up with Dr. Claiborne Billings. --Check TSH. --Currently rate controlled.  Continue metoprolol.  Hypothyroidism.   --Continue levothyroxine --Check TSH  Question of SBP on admission --Antibiotics stopped, peritoneal fluid culture no growth to date thus far as are blood cultures   Continue management as per oncology and GYN oncology.  Still no flatus or bowel movement, cannot advance diet yet.  No opportunity for discharge until bowel obstruction resolved.  Her condition remains guarded with persistent small bowel obstruction.  Try suppository.  Hopefully chemotherapy will relieve this.  Once able to advance diet, monitor for refeeding syndrome.  DVT prophylaxis: SCDs Code Status: Full Family  Communication: Husband at bedside Disposition Plan: home    Murray Hodgkins, MD  [facility] Direct contact: [phone] --Via [place]  --[email or web address]; password [email or web address]  7PM-7AM contact night coverage as above 01/19/2018, 1:22 PM  LOS: 3 days   Consultants  . IR . General surgery . GYN oncology  Procedures  . 1/6 diagnostic and therapeutic paracentesis 1.5 L hazy yellow fluid  1/8 RIGHT INTERNAL JUGULAR SINGLE LUMEN POWER PORT CATHETER INSERTION Antibiotics     Interval History/Subjective  Patient feels better although she still has abdominal pain, this is controlled by pain medication.  She is tolerating clear liquids without nausea or vomiting.  No bowel movement, no flatus.  She is insistent that her diet be advanced and that she will have no flatus until she eats.  Objective   Vitals:  Vitals:   01/19/18 0337 01/19/18 0557  BP:  130/73  Pulse: (!) 108 (!) 104  Resp:  17  Temp:  98.1 F (36.7 C)  SpO2: 98% 99%    Exam: Constitutional:   . Appears calm and comfortable Respiratory:  . CTA bilaterally, no w/r/r.  . Respiratory effort normal. Cardiovascular:  . Irregular, normal rate, no m/r/g . No LE extremity edema   Abdomen:  . Soft, no appreciable bowel sounds, nontender, tympanic. Psychiatric:  . Mental status o Mood, affect stable, without change   I have personally reviewed the following:   Today's Data  . BMP unremarkable . EKG ordered last night shows A. fib with RVR, ventricular rate 116, no acute changes, independent review.  Lab Data  .   Micro Data since admission  . Blood cultures no growth to date . Peritoneal fluid culture no growth to date  Imaging since admission  .  CT chest no active disease . CT abdomen pelvis noted showed dilated fluid-filled loops of small bowel concerning for early or partial SBO.  Cardiology Data  .   Other Data  .   Scheduled Meds: . bisacodyl  10 mg Rectal Once  . enoxaparin (LOVENOX)  injection  40 mg Subcutaneous Q24H  . fluticasone  2 spray Each Nare Daily  . levothyroxine  75 mcg Oral Daily  . losartan  25 mg Oral Daily  . metoprolol succinate  25 mg Oral Daily  . pantoprazole  40 mg Oral Daily  . senna  1 tablet Oral BID  . simvastatin  20 mg Oral QHS   Continuous Infusions: . sodium chloride 75 mL/hr at 01/18/18 1328  . sodium chloride    . sodium chloride    . famotidine (PEPCID) IV      Principal Problem:   Partial small bowel obstruction (HCC) Active Problems:   Hypothyroidism   GERD   Carcinomatosis ([place])   Abdominal pain   Ovarian cancer ([place]) suspected   LOS: 3 days

## 2018-01-19 NOTE — Care Management Important Message (Signed)
Important Message  Patient Details  Name: MIKHAILA ROH MRN: [id number] Date of Birth: 03/31/1945   Medicare Important Message Given:  Yes    Kerin Salen 01/22/2018, 10:43 AMImportant Message  Patient Details  Name: DONNETTA GILLIN MRN: [id number] Date of Birth: 1945/12/24   Medicare Important Message Given:  Yes    Kerin Salen 01/19/2018, 10:33 AM

## 2018-01-19 NOTE — Progress Notes (Signed)
Patient had small type 4 bowel movement.

## 2018-01-20 LAB — CULTURE, BODY FLUID W GRAM STAIN -BOTTLE: Culture: NO GROWTH

## 2018-01-20 LAB — CULTURE, BLOOD (ROUTINE X 2)
Culture: NO GROWTH
Culture: NO GROWTH
Special Requests: ADEQUATE
Special Requests: ADEQUATE

## 2018-01-20 LAB — PHOSPHORUS: Phosphorus: 1.3 mg/dL — ABNORMAL LOW (ref 2.5–4.6)

## 2018-01-20 LAB — MAGNESIUM: Magnesium: 2.1 mg/dL (ref 1.7–2.4)

## 2018-01-20 LAB — TSH: TSH: 0.461 u[IU]/mL (ref 0.350–4.500)

## 2018-01-20 MED ORDER — DOCUSATE SODIUM 100 MG PO CAPS
100.0000 mg | ORAL_CAPSULE | Freq: Two times a day (BID) | ORAL | Status: DC
  Administered 2018-01-20 – 2018-02-02 (×23): 100 mg via ORAL
  Filled 2018-01-20 (×25): qty 1

## 2018-01-20 MED ORDER — SIMETHICONE 80 MG PO CHEW
80.0000 mg | CHEWABLE_TABLET | Freq: Once | ORAL | Status: DC
  Filled 2018-01-20: qty 1

## 2018-01-20 MED ORDER — ALUM & MAG HYDROXIDE-SIMETH 200-200-20 MG/5ML PO SUSP
15.0000 mL | Freq: Four times a day (QID) | ORAL | Status: DC | PRN
  Administered 2018-01-20 – 2018-01-22 (×2): 15 mL via ORAL
  Filled 2018-01-20 (×2): qty 30

## 2018-01-20 MED ORDER — ALUM & MAG HYDROXIDE-SIMETH 200-200-20 MG/5ML PO SUSP
30.0000 mL | Freq: Once | ORAL | Status: AC | PRN
  Administered 2018-01-20: 30 mL via ORAL
  Filled 2018-01-20: qty 30

## 2018-01-20 MED ORDER — SIMETHICONE 80 MG PO CHEW
80.0000 mg | CHEWABLE_TABLET | Freq: Four times a day (QID) | ORAL | Status: DC | PRN
  Administered 2018-01-20 – 2018-01-22 (×4): 80 mg via ORAL
  Filled 2018-01-20 (×5): qty 1

## 2018-01-20 NOTE — Progress Notes (Signed)
PROGRESS NOTE    Haley [name]  PJA:[id number] DOB: 03-26-1945 DOA: 01/15/2018 PCP: Martinique, Toby G, MD    Brief Narrative:  73 year old female who presented with abdominal pain, she does have significant past medical history for ovarian cancer, complicated with peritoneal carcinomatosis and malignant ascites.  Patient developed abdominal pain, that was refractory to analgesics and paracentesis.  She was afebrile, blood pressure 112/62, heart rate 65, respiratory rate 18, oxygen saturation 95%, she had dry mucous membranes, lungs clear to auscultation bilaterally, heart S1-S2 present and rhythmic, her abdomen was mildly distended, tender to palpation no masses palpable, no lower extremity edema.  Her abdomen and pelvis CT showed dilated fluid filled loops of small bowel with transition point likely in the right adnexa adjacent to, cystic mass.  Patient was admitted to the hospital with working diagnosis of small bowel obstruction.   Assessment & Plan:   Principal Problem:   Partial small bowel obstruction (HCC) Active Problems:   Hypothyroidism   GERD   PAF (paroxysmal atrial fibrillation) (HCC)   Carcinomatosis (HCC)   Abdominal pain   Ovarian cancer ([place]) suspected   1. Small bowel obstruction due to ovarian cancer with carcinomatosis. Slowly improving but not yet back to baseline, diet has been advanced to full liquids, positive flatus and small bm, but continue to have pain and distension. Will continue bowel regime and close monitoring for worsening symptoms. Follow with surgical recommendations. Continue isotonic saline IV for hydration. SBP has been ruled out, antibiotics have been discontinued.   2. HTN. Continue blood pressure control with losartan and metoprolol.   3. Dyslipidemia. Continue statin therapy with simvastatin.   4. Hypothyroid. Continue levothyroxine.   5. Paroxysmal atrial fibrillation. Continue rate control with metoprolol, holding on anticoagulation for now.    DVT prophylaxis: scd   Code Status: full Family Communication: I spoke with patient's husband at the bedside and all questions were addressed.  Disposition Plan/ discharge barriers: pending clinical improvement.   Body mass index is 26.52 kg/m. Malnutrition Type:      Malnutrition Characteristics:      Nutrition Interventions:     RN Pressure Injury Documentation:     Consultants:   GYN   Procedures:     Antimicrobials:       Subjective: Patient continue to have abdominal pain, associated with distention, no nausea or vomiting. Positive flatus and bowel movement, diet has been advanced to full liquids.   Objective: Vitals:   01/19/18 1333 01/19/18 1614 01/19/18 2045 01/20/18 0400  BP: 112/71  129/79 132/82  Pulse: 98  100 92  Resp: 18  20 14   Temp: 98.2 F (36.8 C)  98.2 F (36.8 C) 98.8 F (37.1 C)  TempSrc: Oral  Oral Oral  SpO2: 98%  96% 97%  Height:  5\' 2"  (1.575 m)      Intake/Output Summary (Last 24 hours) at 01/20/2018 1250 Last data filed at 01/19/2018 1737 Gross per 24 hour  Intake 720 ml  Output -  Net 720 ml   There were no vitals filed for this visit.  Examination:   General: deconditioned.  Neurology: Awake and alert, non focal  E ENT: mild pallor, no icterus, oral mucosa moist Cardiovascular: No JVD. S1-S2 present, rhythmic, no gallops, rubs, or murmurs. No lower extremity edema. Pulmonary: positive breath sounds bilaterally, adequate air movement, no wheezing, rhonchi or rales. Gastrointestinal. Abdomen distended and tympanic, decreased bowel sounds, no organomegaly.  Skin. No rashes Musculoskeletal: no joint deformities  Data Reviewed: I have personally reviewed following labs and imaging studies  CBC: Recent Labs  Lab 01/15/18 1532 01/16/18 0051 01/16/18 0515 01/17/18 0430  WBC 9.0 8.4 6.0 9.2  NEUTROABS  --   --   --  6.5  HGB 11.5* 12.4 10.8* 11.8*  HCT 35.5* 38.8 34.4* 38.3  MCV 89.0 89.6 90.8 90.5    PLT 387 433* 405* 947*   Basic Metabolic Panel: Recent Labs  Lab 01/15/18 1532 01/16/18 0051 01/16/18 0515 01/17/18 0430 01/19/18 0646 01/19/18 1321 01/20/18 0400  NA 134*  --  135 134* 136 136  --   K 3.4*  --  3.8 3.8 3.6 3.8  --   CL 100  --  105 103 107 107  --   CO2 24  --  22 20* 22 21*  --   GLUCOSE 106*  --  84 78 174* 205*  --   BUN 10  --  10 9 10 10   --   CREATININE 0.54 0.54 0.59 0.65 0.54 0.73  --   CALCIUM 8.4*  --  7.8* 7.9* 7.7* 7.9*  --   MG  --   --   --   --   --   --  2.1  PHOS  --   --   --   --   --   --  1.3*   GFR: Estimated Creatinine Clearance: 55.8 mL/min (by C-G formula based on SCr of 0.73 mg/dL). Liver Function Tests: Recent Labs  Lab 01/15/18 1532 01/16/18 0515 01/17/18 0430  AST 40 24 25  ALT 56* 39 33  ALKPHOS 208* 156* 140*  BILITOT 0.3 0.2* 0.7  PROT 6.4* 5.6* 5.9*  ALBUMIN 2.8* 2.3* 2.6*   Recent Labs  Lab 01/15/18 1532  LIPASE 30   No results for input(s): AMMONIA in the last 168 hours. Coagulation Profile: Recent Labs  Lab 01/17/18 0430  INR 1.11   Cardiac Enzymes: No results for input(s): CKTOTAL, CKMB, CKMBINDEX, TROPONINI in the last 168 hours. BNP (last 3 results) No results for input(s): PROBNP in the last 8760 hours. HbA1C: No results for input(s): HGBA1C in the last 72 hours. CBG: No results for input(s): GLUCAP in the last 168 hours. Lipid Profile: No results for input(s): CHOL, HDL, LDLCALC, TRIG, CHOLHDL, LDLDIRECT in the last 72 hours. Thyroid Function Tests: Recent Labs    01/20/18 0400  TSH 0.461   Anemia Panel: No results for input(s): VITAMINB12, FOLATE, FERRITIN, TIBC, IRON, RETICCTPCT in the last 72 hours.    Radiology Studies: I have reviewed all of the imaging during this hospital visit personally     Scheduled Meds: . docusate sodium  100 mg Oral BID  . enoxaparin (LOVENOX) injection  40 mg Subcutaneous Q24H  . fluticasone  2 spray Each Nare Daily  . hydrocortisone   Rectal BID   . levothyroxine  75 mcg Oral Daily  . losartan  25 mg Oral Daily  . metoprolol succinate  25 mg Oral Daily  . pantoprazole  40 mg Oral Daily  . senna  1 tablet Oral BID  . simethicone  80 mg Oral Once  . simvastatin  20 mg Oral QHS   Continuous Infusions: . sodium chloride 75 mL/hr at 01/20/18 0810  . sodium chloride    . sodium chloride    . famotidine (PEPCID) IV       LOS: 4 days        Ruth Tully Gerome Apley, MD [facility] Pager [phone]

## 2018-01-20 NOTE — Progress Notes (Signed)
Patient briefly seen this am. Passing small amounts of flatus and had 2 small soft BM. Still having pain. C/o gas build up.   On exam abdomen is mildly distended but soft and NT. Hypoactive bowel sounds with occasional rushing. No rebound.   Plan per Medicine/Oncology I suspect she still has a component of partial obstruction based on my exam.  I added colace and simethicone to her MAR.  Will return Monday to check  on her.  If any emesis/surgical concerns I am available before then.   Precious Haws, MD Gyn Onc

## 2018-01-21 MED ORDER — POTASSIUM PHOSPHATE MONOBASIC 500 MG PO TABS
500.0000 mg | ORAL_TABLET | Freq: Three times a day (TID) | ORAL | Status: AC
  Administered 2018-01-21 (×2): 500 mg via ORAL
  Filled 2018-01-21 (×3): qty 1

## 2018-01-21 NOTE — Progress Notes (Signed)
[facility]  PROGRESS NOTE    Haley [name]  QJJ:[id number] DOB: Mar 05, 1945 DOA: 01/15/2018 PCP: Martin[place], Ceclia G, MD    Brief Narrative:  73 year old female who presented with abdominal pain, she does have significant past medical history for ovarian cancer, complicated with peritoneal carcinomatosis and malignant ascites.  Patient developed abdominal pain, that was refractory to analgesics and paracentesis.  She was afebrile, blood pressure 112/62, heart rate 65, respiratory rate 18, oxygen saturation 95%, she had dry mucous membranes, lungs clear to auscultation bilaterally, heart S1-S2 present and rhythmic, her abdomen was mildly distended, tender to palpation no masses palpable, no lower extremity edema.  Her abdomen and pelvis CT showed dilated fluid filled loops of small bowel with transition point likely in the right adnexa adjacent to, cystic mass.  Patient was admitted to the hospital with working diagnosis of small bowel obstruction.   Assessment & Plan:   Principal Problem:   Partial small bowel obstruction (HCC) Active Problems:   Hypothyroidism   GERD   PAF (paroxysmal atrial fibrillation) (HCC)   Carcinomatosis (HCC)   Abdominal pain   Ovarian cancer ([place]) suspected   1. Small bowel obstruction due to ovarian cancer with carcinomatosis. Continue to have abdominal pain and distention, reports small bowel movements and flatus, tolerating full liquid diet, despite nausea, no vomiting.  Not yet back to baseline. Continue supportive IV fluids, IV analgesics, IV antiacids and as needed antiemetics.   2. HTN. Continue  losartan and metoprolol for blood pressure control.   3. Dyslipidemia. Continue simvastatin with good toleration.   4. Hypothyroid. On levothyroxine.   5. Paroxysmal atrial fibrillation. On metoprolol for rate control, holding on anticoagulation for now, due to risk of bleeding.   6. Hypophosphatemia. Will continue phosphorus correction, will check Mg in am, will  continue isotonic saline at 75 ml per hour.    DVT prophylaxis: scd  Code Status: full Family Communication: no family at the bedside Disposition Plan/ discharge barriers: Pending clinical improvement  Body mass index is 26.52 kg/m. Malnutrition Type:      Malnutrition Characteristics:      Nutrition Interventions:     RN Pressure Injury Documentation:     Consultants:   GYN  Procedures:     Antimicrobials:       Subjective: Patient not feeling well today, positive nausea, aches and pains, no vomiting, continue to have small bowel movement and continue to have flatus. Tolerating full liquid diet.   Objective: Vitals:   01/20/18 0400 01/20/18 1739 01/20/18 2114 01/21/18 0610  BP: 132/82 (!) 136/92 130/74 (!) 117/95  Pulse: 92 92 92 (!) 104  Resp: 14 18 16 17   Temp: 98.8 F (37.1 C) 98.3 F (36.8 C) 98.1 F (36.7 C) 99.1 F (37.3 C)  TempSrc: Oral Oral Oral Oral  SpO2: 97% 97% 96% 93%  Height:        Intake/Output Summary (Last 24 hours) at 01/21/2018 1042 Last data filed at 01/21/2018 0600 Gross per 24 hour  Intake 1100 ml  Output -  Net 1100 ml   There were no vitals filed for this visit.  Examination:   General: deconditioned and ill looking appearing.  Neurology: Awake and alert, non focal  E ENT: positive pallor, no icterus, oral mucosa moist Cardiovascular: No JVD. S1-S2 present, rhythmic, no gallops, rubs, or murmurs. No lower extremity edema. Pulmonary: positive breath sounds bilaterally, decreased air movement due to poor inspiratory effort, no wheezing, rhonchi or rales. Gastrointestinal. Abdomen distended with no organomegaly,  mild tender to deep palpation, no rebound or guarding Skin. No rashes Musculoskeletal: no joint deformities     Data Reviewed: I have personally reviewed following labs and imaging studies  CBC: Recent Labs  Lab 01/15/18 1532 01/16/18 0051 01/16/18 0515 01/17/18 0430  WBC 9.0 8.4 6.0 9.2    NEUTROABS  --   --   --  6.5  HGB 11.5* 12.4 10.8* 11.8*  HCT 35.5* 38.8 34.4* 38.3  MCV 89.0 89.6 90.8 90.5  PLT 387 433* 405* 233*   Basic Metabolic Panel: Recent Labs  Lab 01/15/18 1532 01/16/18 0051 01/16/18 0515 01/17/18 0430 01/19/18 0646 01/19/18 1321 01/20/18 0400  NA 134*  --  135 134* 136 136  --   K 3.4*  --  3.8 3.8 3.6 3.8  --   CL 100  --  105 103 107 107  --   CO2 24  --  22 20* 22 21*  --   GLUCOSE 106*  --  84 78 174* 205*  --   BUN 10  --  10 9 10 10   --   CREATININE 0.54 0.54 0.59 0.65 0.54 0.73  --   CALCIUM 8.4*  --  7.8* 7.9* 7.7* 7.9*  --   MG  --   --   --   --   --   --  2.1  PHOS  --   --   --   --   --   --  1.3*   GFR: Estimated Creatinine Clearance: 55.8 mL/min (by C-[name] formula based on SCr of 0.73 mg/dL). Liver Function Tests: Recent Labs  Lab 01/15/18 1532 01/16/18 0515 01/17/18 0430  AST 40 24 25  ALT 56* 39 33  ALKPHOS 208* 156* 140*  BILITOT 0.3 0.2* 0.7  PROT 6.4* 5.6* 5.9*  ALBUMIN 2.8* 2.3* 2.6*   Recent Labs  Lab 01/15/18 1532  LIPASE 30   No results for input(s): AMMONIA in the last 168 hours. Coagulation Profile: Recent Labs  Lab 01/17/18 0430  INR 1.11   Cardiac Enzymes: No results for input(s): CKTOTAL, CKMB, CKMBINDEX, TROPONINI in the last 168 hours. BNP (last 3 results) No results for input(s): PROBNP in the last 8760 hours. HbA1C: No results for input(s): HGBA1C in the last 72 hours. CBG: No results for input(s): GLUCAP in the last 168 hours. Lipid Profile: No results for input(s): CHOL, HDL, LDLCALC, TRIG, CHOLHDL, LDLDIRECT in the last 72 hours. Thyroid Function Tests: Recent Labs    01/20/18 0400  TSH 0.461   Anemia Panel: No results for input(s): VITAMINB12, FOLATE, FERRITIN, TIBC, IRON, RETICCTPCT in the last 72 hours.    Radiology Studies: I have reviewed all of the imaging during this hospital visit personally     Scheduled Meds: . docusate sodium  100 mg Oral BID  . enoxaparin  (LOVENOX) injection  40 mg Subcutaneous Q24H  . fluticasone  2 spray Each Nare Daily  . hydrocortisone   Rectal BID  . levothyroxine  75 mcg Oral Daily  . losartan  25 mg Oral Daily  . metoprolol succinate  25 mg Oral Daily  . pantoprazole  40 mg Oral Daily  . potassium phosphate (monobasic)  500 mg Oral TID WC & HS  . senna  1 tablet Oral BID  . simethicone  80 mg Oral Once  . simvastatin  20 mg Oral QHS   Continuous Infusions: . sodium chloride 75 mL/hr at 01/21/18 1001  . sodium chloride    .  sodium chloride    . famotidine (PEPCID) IV       LOS: 5 days        Hollis Oh Gerome Apley, MD [facility] Pager [phone]

## 2018-01-22 DIAGNOSIS — E43 Unspecified severe protein-calorie malnutrition: Secondary | ICD-10-CM

## 2018-01-22 LAB — BASIC METABOLIC PANEL
Anion gap: 8 (ref 5–15)
BUN: 6 mg/dL — ABNORMAL LOW (ref 8–23)
CO2: 26 mmol/L (ref 22–32)
Calcium: 8.1 mg/dL — ABNORMAL LOW (ref 8.9–10.3)
Chloride: 104 mmol/L (ref 98–111)
Creatinine, Ser: 0.57 mg/dL (ref 0.44–1.00)
GFR calc Af Amer: >60 mL/min (ref >60)
GFR calc non Af Amer: >60 mL/min (ref >60)
Glucose, Bld: 103 mg/dL — ABNORMAL HIGH (ref 70–99)
POTASSIUM: 3.3 mmol/L — AB (ref 3.5–5.1)
Sodium: 138 mmol/L (ref 135–145)

## 2018-01-22 LAB — MAGNESIUM: Magnesium: 2.2 mg/dL (ref 1.7–2.4)

## 2018-01-22 LAB — PHOSPHORUS: PHOSPHORUS: 2.8 mg/dL (ref 2.5–4.6)

## 2018-01-22 MED ORDER — ALUM & MAG HYDROXIDE-SIMETH 200-200-20 MG/5ML PO SUSP
15.0000 mL | ORAL | Status: DC | PRN
  Administered 2018-01-23 (×2): 15 mL via ORAL
  Filled 2018-01-22 (×2): qty 30

## 2018-01-22 MED ORDER — CYCLOBENZAPRINE HCL 5 MG PO TABS
5.0000 mg | ORAL_TABLET | Freq: Three times a day (TID) | ORAL | Status: DC | PRN
  Administered 2018-01-23: 5 mg via ORAL
  Filled 2018-01-22: qty 1

## 2018-01-22 NOTE — Progress Notes (Signed)
PROGRESS NOTE    Haley [name]  NTI:[id number] DOB: 09-Apr-1945 DOA: 01/15/2018 PCP: [place]ique, Haley G, MD    Brief Narrative:  73 year old female who presented with abdominal pain, she does have significant past medical history for ovarian cancer,complicated with peritoneal carcinomatosis and malignant ascites. Patient developed abdominal pain, that was refractory to analgesics and paracentesis. She was afebrile, blood pressure 112/62, heart rate 65, respiratory rate 18, oxygen saturation 95%, she had dry mucous membranes, lungs clear to auscultation bilaterally, heart S1-S2 present and rhythmic,her abdomen was mildly distended, tender to palpation no masses palpable, no lower extremity edema. Her abdomen and pelvis CTshowed dilated fluid filled loops of small bowel with transition point likely in the right adnexa adjacent to, cystic mass.  Patient was admitted to the hospital with working diagnosis of small bowel obstruction.   Assessment & Plan:   Principal Problem:   Partial small bowel obstruction (HCC) Active Problems:   Hypothyroidism   GERD   PAF (paroxysmal atrial fibrillation) (HCC)   Carcinomatosis (HCC)   Abdominal pain   Ovarian cancer ([place]) suspected   1. Small bowel obstruction due to ovarian cancer with carcinomatosis. Currently on supportive IV fluids, IV analgesics, IV antiacids and as needed antiemetics. Patient with slow progress, continue to be distended, no bowel movement today, but improved pain, not back to baseline.   2. HTN. Continue blood pressure control with losartan and metoprolol.   3. Dyslipidemia. On simvastatin per home regimen.   4. Hypothyroid. Continue with levothyroxine.   5. Paroxysmal atrial fibrillation. On metoprolol for rate control, holding on anticoagulation for now, due to risk of bleeding.   6. Hypophosphatemia and hypokalemia. P up to 2,8 and mg at 2,2. K down to 3,3, Renal function is preserved with serum cr at 0,57.     7. New back pain. Likely musculoskeletal, will add as needed flexeril for muscle relaxant.   DVT prophylaxis: scd  Code Status: full Family Communication: no family at the bedside Disposition Plan/ discharge barriers: Pending clinical improvement  Body mass index is 26.52 kg/m. Malnutrition Type:      Malnutrition Characteristics:      Nutrition Interventions:     RN Pressure Injury Documentation:     Consultants:   GYN  Oncology   Procedures:     Antimicrobials:      Subjective: Patient now having back pain, bilateral, worse with movement, no bowel movement today, and positive distention, her diet has been advanced.   Objective: Vitals:   01/21/18 1518 01/21/18 2145 01/22/18 0010 01/22/18 0658  BP: 136/83 136/74  (!) 124/98  Pulse: (!) 150 (!) 125 (!) 116 (!) 101  Resp: 18 17  16   Temp: 98.6 F (37 C) 99.1 F (37.3 C)  98.2 F (36.8 C)  TempSrc: Oral Oral  Oral  SpO2: 94% 92% 94% 95%  Height:        Intake/Output Summary (Last 24 hours) at 01/22/2018 1235 Last data filed at 01/22/2018 1023 Gross per 24 hour  Intake 1855 ml  Output -  Net 1855 ml   There were no vitals filed for this visit.  Examination:   General: deconditioned  Neurology: Awake and alert, non focal  E ENT: no pallor, no icterus, oral mucosa moist Cardiovascular: No JVD. S1-S2 present, rhythmic, no gallops, rubs, or murmurs. No lower extremity edema. Pulmonary: vesicular breath sounds bilaterally, adequate air movement, no wheezing, rhonchi or rales. Gastrointestinal. Abdomen distended with no organomegaly, non tender, no rebound or guarding, tympanic.  Skin. No rashes Musculoskeletal: no joint deformities/ positive pain to palpation at the lower back.      Data Reviewed: I have personally reviewed following labs and imaging studies  CBC: Recent Labs  Lab 01/15/18 1532 01/16/18 0051 01/16/18 0515 01/17/18 0430  WBC 9.0 8.4 6.0 9.2  NEUTROABS  --   --    --  6.5  HGB 11.5* 12.4 10.8* 11.8*  HCT 35.5* 38.8 34.4* 38.3  MCV 89.0 89.6 90.8 90.5  PLT 387 433* 405* 956*   Basic Metabolic Panel: Recent Labs  Lab 01/16/18 0515 01/17/18 0430 01/19/18 0646 01/19/18 1321 01/20/18 0400 01/22/18 0630  NA 135 134* 136 136  --  138  K 3.8 3.8 3.6 3.8  --  3.3*  CL 105 103 107 107  --  104  CO2 22 20* 22 21*  --  26  GLUCOSE 84 78 174* 205*  --  103*  BUN 10 9 10 10   --  6*  CREATININE 0.59 0.65 0.54 0.73  --  0.57  CALCIUM 7.8* 7.9* 7.7* 7.9*  --  8.1*  MG  --   --   --   --  2.1 2.2  PHOS  --   --   --   --  1.3* 2.8   GFR: Estimated Creatinine Clearance: 55.8 mL/min (by C-[name] formula based on SCr of 0.57 mg/dL). Liver Function Tests: Recent Labs  Lab 01/15/18 1532 01/16/18 0515 01/17/18 0430  AST 40 24 25  ALT 56* 39 33  ALKPHOS 208* 156* 140*  BILITOT 0.3 0.2* 0.7  PROT 6.4* 5.6* 5.9*  ALBUMIN 2.8* 2.3* 2.6*   Recent Labs  Lab 01/15/18 1532  LIPASE 30   No results for input(s): AMMONIA in the last 168 hours. Coagulation Profile: Recent Labs  Lab 01/17/18 0430  INR 1.11   Cardiac Enzymes: No results for input(s): CKTOTAL, CKMB, CKMBINDEX, TROPONINI in the last 168 hours. BNP (last 3 results) No results for input(s): PROBNP in the last 8760 hours. HbA1C: No results for input(s): HGBA1C in the last 72 hours. CBG: No results for input(s): GLUCAP in the last 168 hours. Lipid Profile: No results for input(s): CHOL, HDL, LDLCALC, TRIG, CHOLHDL, LDLDIRECT in the last 72 hours. Thyroid Function Tests: Recent Labs    01/20/18 0400  TSH 0.461   Anemia Panel: No results for input(s): VITAMINB12, FOLATE, FERRITIN, TIBC, IRON, RETICCTPCT in the last 72 hours.    Radiology Studies: I have reviewed all of the imaging during this hospital visit personally     Scheduled Meds: . docusate sodium  100 mg Oral BID  . enoxaparin (LOVENOX) injection  40 mg Subcutaneous Q24H  . fluticasone  2 spray Each Nare Daily  .  hydrocortisone   Rectal BID  . levothyroxine  75 mcg Oral Daily  . losartan  25 mg Oral Daily  . metoprolol succinate  25 mg Oral Daily  . pantoprazole  40 mg Oral Daily  . senna  1 tablet Oral BID  . simethicone  80 mg Oral Once  . simvastatin  20 mg Oral QHS   Continuous Infusions: . sodium chloride 75 mL/hr at 01/22/18 0043  . sodium chloride    . sodium chloride    . famotidine (PEPCID) IV       LOS: 6 days        Haley [name] Gerome Apley, MD [facility] Pager [phone]

## 2018-01-22 NOTE — Progress Notes (Signed)
Patient seen and examined this morning.  Denies nausea except when given IV pain meds. Denies flatus/BM. No emesis. Has lost appetite and feels "sick". When asked to clarify further she says it's aches/joint pain everywhere.  Has occasional bowel sounds on exam although hypoactive. Soft and non tender.  I will return Wed if she is still admitted to check in.  Haley Haws, MD Gyn Onc

## 2018-01-22 NOTE — Progress Notes (Signed)
Calorie Count Brief Note  48 hour calorie count ordered per MD 1/13. Days 1 & 2 results to follow once completed.  Clayton Bibles, MS, RD, [place] [facility] Pager: [phone] After Hours Pager: [phone]

## 2018-01-22 NOTE — Progress Notes (Signed)
[place]   DOB:1945/12/18   Jamala Kohen#:[id number]    Assessment & Plan:  Locally advanced cancer with carcinomatosis, suspicious for ovarian cancer She tolerated cycle 1 day 1 of carboplatin and Taxol on January 18, 2018.  Hopefully, her symptoms of carcinomatosis is improving with spontaneous bowel movement Continue supportive care.  Nausea, vomiting, bloating and constipation, resolving Her symptoms are most consistent with carcinomatosis/partial small bowel obstruction Continue conservative management Advancing her diet to soft diet along with calorie counts.  She will continue antiemetics and pain medicine as needed.  I encouraged her to continue to eat as much as possible  Malignant ascites status post therapeutic paracentesis on January 15, 2018 She still have presence of ascites but at present time, I do not believe she needs another therapeutic paracentesis for now  Severe protein calorie malnutrition Encouraged frequent small meals Replace electrolytes as needed  Discharge planning Not ready for discharge due to unresolved bowel obstructive symptoms Will follow.  Heath Lark, MD 01/22/2018  7:35 AM   Subjective:  She feels unwell.  Complaining of poor appetite, nausea and abdominal pain.  Noted that the patient started to have passage of flatus and spontaneous bowel movement.  She did not like the choices of food that was offered under full liquid diet.  She was able to walk around  Objective:  Vitals:   01/22/18 0010 01/22/18 0658  BP:  (!) 124/98  Pulse: (!) 116 (!) 101  Resp:  16  Temp:  98.2 F (36.8 C)  SpO2: 94% 95%     Intake/Output Summary (Last 24 hours) at 01/22/2018 0735 Last data filed at 01/22/2018 0211 Gross per 24 hour  Intake 1615 ml  Output 1 ml  Net 1614 ml    GENERAL:alert, appears somewhat in mild distress and feels uncomfortable SKIN: skin color, texture, turgor are normal, no rashes or significant lesions EYES: normal, Conjunctiva are pink  and non-injected, sclera clear OROPHARYNX:no exudate, no erythema and lips, buccal mucosa, and tongue normal  NECK: supple, thyroid normal size, non-tender, without nodularity LYMPH:  no palpable lymphadenopathy in the cervical, axillary or inguinal LUNGS: clear to auscultation and percussion with normal breathing effort HEART: regular rate & rhythm and no murmurs and no lower extremity edema ABDOMEN:abdomen soft, distended with ascites with active bowel sounds throughout Musculoskeletal:no cyanosis of digits and no clubbing  NEURO: alert & oriented x 3 with fluent speech, no focal motor/sensory deficits   Labs:  Lab Results  Component Value Date   WBC 9.2 01/17/2018   HGB 11.8 (L) 01/17/2018   HCT 38.3 01/17/2018   MCV 90.5 01/17/2018   PLT 456 (H) 01/17/2018   NEUTROABS 6.5 01/17/2018    Lab Results  Component Value Date   NA 138 01/22/2018   K 3.3 (L) 01/22/2018   CL 104 01/22/2018   CO2 26 01/22/2018    Studies:  No results found.

## 2018-01-23 ENCOUNTER — Inpatient Hospital Stay (HOSPITAL_COMMUNITY): Payer: PPO

## 2018-01-23 DIAGNOSIS — E039 Hypothyroidism, unspecified: Secondary | ICD-10-CM

## 2018-01-23 DIAGNOSIS — K219 Gastro-esophageal reflux disease without esophagitis: Secondary | ICD-10-CM

## 2018-01-23 MED ORDER — METOPROLOL TARTRATE 5 MG/5ML IV SOLN
5.0000 mg | Freq: Once | INTRAVENOUS | Status: AC
  Administered 2018-01-23: 5 mg via INTRAVENOUS
  Filled 2018-01-23: qty 5

## 2018-01-23 MED ORDER — LIDOCAINE HCL 1 % IJ SOLN
INTRAMUSCULAR | Status: AC
  Filled 2018-01-23: qty 10

## 2018-01-23 MED ORDER — HYDRALAZINE HCL 20 MG/ML IJ SOLN: 10.0000 mg | INTRAMUSCULAR | Status: DC | PRN

## 2018-01-23 MED ORDER — METOPROLOL TARTRATE 5 MG/5ML IV SOLN
5.0000 mg | Freq: Four times a day (QID) | INTRAVENOUS | Status: DC
  Administered 2018-01-23 – 2018-01-24 (×4): 5 mg via INTRAVENOUS
  Filled 2018-01-23 (×4): qty 5

## 2018-01-23 MED ORDER — LEVOTHYROXINE SODIUM 100 MCG/5ML IV SOLN
35.0000 ug | Freq: Every day | INTRAVENOUS | Status: DC
  Administered 2018-01-24: 35 ug via INTRAVENOUS
  Filled 2018-01-23: qty 5

## 2018-01-23 MED ORDER — LEVOTHYROXINE SODIUM 100 MCG/5ML IV SOLN
35.0000 ug | Freq: Every day | INTRAVENOUS | Status: DC
  Filled 2018-01-23: qty 5

## 2018-01-23 MED ORDER — LORAZEPAM 2 MG/ML IJ SOLN
0.5000 mg | INTRAMUSCULAR | Status: DC | PRN
  Administered 2018-01-23 – 2018-01-27 (×5): 0.5 mg via INTRAVENOUS
  Filled 2018-01-23 (×5): qty 1

## 2018-01-23 MED ORDER — FAMOTIDINE IN NACL 20-0.9 MG/50ML-% IV SOLN
20.0000 mg | INTRAVENOUS | Status: DC
  Administered 2018-01-23 – 2018-01-26 (×4): 20 mg via INTRAVENOUS
  Filled 2018-01-23 (×4): qty 50

## 2018-01-23 MED ORDER — MORPHINE SULFATE (PF) 2 MG/ML IV SOLN: 1.0000 mg | INTRAVENOUS | Status: DC | PRN

## 2018-01-23 MED ORDER — ONDANSETRON HCL 4 MG/2ML IJ SOLN
4.0000 mg | Freq: Once | INTRAMUSCULAR | Status: AC
  Administered 2018-01-23: 4 mg via INTRAVENOUS

## 2018-01-23 MED ORDER — POLYETHYLENE GLYCOL 3350 17 G PO PACK
17.0000 g | PACK | Freq: Every day | ORAL | Status: DC
  Administered 2018-01-26 – 2018-02-01 (×5): 17 g via ORAL
  Filled 2018-01-23 (×9): qty 1

## 2018-01-23 MED ORDER — DIGOXIN 0.25 MG/ML IJ SOLN
0.2500 mg | Freq: Once | INTRAMUSCULAR | Status: AC
  Administered 2018-01-23: 0.25 mg via INTRAVENOUS
  Filled 2018-01-23: qty 1

## 2018-01-23 NOTE — Progress Notes (Signed)
Haley [name]   DOB:June 27, 1945   MM#:[id number]    Assessment & Plan:   Locally advanced cancer with carcinomatosis, suspicious for ovarian cancer She tolerated cycle 1 day 1 of carboplatin and Taxol on January 18, 2018.  Continue supportive care  Nausea, vomiting, bloating and constipation Her symptoms are most consistent with carcinomatosis/partial small bowel obstruction I changed her diet back to n.p.o. with ice chips only I will order x-ray of the abdomen If she has persistent signs of small bowel obstruction, I recommend placement of NG tube Upon the suggestion, the patient does not seem keen to undergo NG tube placement I will come back later to review x-ray report I advised her to take some pain medicine as needed  Malignant ascites status post therapeutic paracentesis on January 15, 2018 She still have presence of ascites  Due to her symptoms, I will order ultrasound paracentesis for symptomatic relief  Severe protein calorie malnutrition Due to her nausea and vomiting, she is placed back on n.p.o.  Discharge planning Not ready for discharge due to unresolved bowel obstructive symptoms Will follow. Heath Lark, MD 01/23/2018  7:26 AM   Subjective:  The patient just vomited this morning when I walked in.  She repeatedly say, "I cannot live like this".  She has some pain but refused pain medicine.  She had no bowel movement for 3 days.  She has persistent fullness and nausea and wants someone "to take all her disease out of her abdomen".  Objective:  Vitals:   01/22/18 2020 01/23/18 0454  BP: (!) 142/94 140/90  Pulse: 67 62  Resp: 18 16  Temp: 97.9 F (36.6 C) 98 F (36.7 C)  SpO2: 97% 98%     Intake/Output Summary (Last 24 hours) at 01/23/2018 0726 Last data filed at 01/22/2018 1023 Gross per 24 hour  Intake 360 ml  Output -  Net 360 ml    GENERAL:alert, in significant distress and ruminates the same problem over and over again SKIN: skin color, texture, turgor  are normal, no rashes or significant lesions EYES: normal, Conjunctiva are pink and non-injected, sclera clear OROPHARYNX:no exudate, no erythema and lips, buccal mucosa, and tongue normal  NECK: supple, thyroid normal size, non-tender, without nodularity LYMPH:  no palpable lymphadenopathy in the cervical, axillary or inguinal LUNGS: clear to auscultation and percussion with normal breathing effort HEART: regular rate & rhythm and no murmurs and no lower extremity edema ABDOMEN:abdomen soft, distended with hyperactive bowel sounds throughout Musculoskeletal:no cyanosis of digits and no clubbing  NEURO: alert & oriented x 3 with fluent speech, no focal motor/sensory deficits   Labs:  Lab Results  Component Value Date   WBC 9.2 01/17/2018   HGB 11.8 (L) 01/17/2018   HCT 38.3 01/17/2018   MCV 90.5 01/17/2018   PLT 456 (H) 01/17/2018   NEUTROABS 6.5 01/17/2018    Lab Results  Component Value Date   NA 138 01/22/2018   K 3.3 (L) 01/22/2018   CL 104 01/22/2018   CO2 26 01/22/2018    Studies:  No results found.

## 2018-01-23 NOTE — Procedures (Signed)
Ultrasound-guided  therapeutic paracentesis performed yielding 450 cc of hazy, yellow fluid. No immediate complications. EBL <1 cc. Only small amount of ascites present on today's scan.

## 2018-01-23 NOTE — Progress Notes (Signed)
Patient arrived to room 1432 complaining of nausea.  Zofran not due at this time.  Last given at 1424.   Dr. Cathlean Sauer paged and notified.  Gave order for one time dose of Zofran 4 mg IV.

## 2018-01-23 NOTE — Progress Notes (Signed)
PROGRESS NOTE    Haley [name]  FMB:[id number] DOB: 29-Jul-1945 DOA: 01/15/2018 PCP: [place], Danille G, MD    Brief Narrative:  73 year old female who presented with abdominal pain, she does have significant past medical history for ovarian cancer,complicated with peritoneal carcinomatosis and malignant ascites. Patient developed abdominal pain, that was refractory to analgesics and paracentesis. She was afebrile, blood pressure 112/62, heart rate 65, respiratory rate 18, oxygen saturation 95%, she had dry mucous membranes, lungs clear to auscultation bilaterally, heart S1-S2 present and rhythmic,her abdomen was mildly distended, tender to palpation no masses palpable, no lower extremity edema. Her abdomen and pelvis CTshowed dilated fluid filled loops of small bowel with transition point likely in the right adnexa adjacent to, cystic mass.  Patient was admitted to the hospital with working diagnosis of small bowel obstruction.    Assessment & Plan:   Principal Problem:   Partial small bowel obstruction (HCC) Active Problems:   Hypothyroidism   GERD   PAF (paroxysmal atrial fibrillation) (HCC)   Carcinomatosis (HCC)   Abdominal pain   Ovarian cancer ([place]) suspected  1. Small bowel obstruction due to ovarian cancer with carcinomatosis. Today with worsening symptoms, distention, pain, nausea and vomiting. Placed an NG tube and connected to low intermittent suction. Hold all po meds and keep NPO. Continue as needed analgesics IV and IV antiemetics.   2. HTN. Hold on oral antihypertensive medications for now.  3. Dyslipidemia. Hold on oral medications for now.   4. Hypothyroid.Will continue with levothyroxine IV.   5. Paroxysmal atrial fibrillation.Will change to IV metoprolol for rate control, with holding parameters. Continue telemetry monitoring, continue to hold on anticoagulation.   6. Hypophosphatemia and hypokalemia. Continue k correction with Kcl, will follow on  renal panel in am. K today down to 3,3 with preserved renal function, serum cr at 0,57.  7. New back pain. Continue as needed IV analgesics.   DVT prophylaxis:scd Code Status:full Family Communication:no family at the bedside Disposition Plan/ discharge barriers:Pending clinical improvement  Body mass index is 26.52 kg/m. Malnutrition Type:  Nutrition Problem: Inadequate oral intake Etiology: inability to eat(SBO)   Malnutrition Characteristics:  Signs/Symptoms: NPO status   Nutrition Interventions:  Interventions: Refer to RD note for recommendations, Calorie Count  RN Pressure Injury Documentation:     Consultants:   Oncology   GYN   Procedures:     Antimicrobials:       Subjective: Patient with worsening abdominal distention, positive nausea and abdominal pain. No flatus or bowel movement. No chest pain or dyspnea.   Objective: Vitals:   01/23/18 1047 01/23/18 1051 01/23/18 1056 01/23/18 1154  BP: (!) 142/87 133/86 120/79 (!) 151/100  Pulse:    (!) 115  Resp:    20  Temp:    98.4 F (36.9 C)  TempSrc:    Oral  SpO2:    95%  Height:        Intake/Output Summary (Last 24 hours) at 01/23/2018 1344 Last data filed at 01/23/2018 1043 Gross per 24 hour  Intake 240 ml  Output 1 ml  Net 239 ml   There were no vitals filed for this visit.  Examination:   General: deconditioned and ill looking appearing  Neurology: Awake and alert, non focal  E ENT: positive pallor, no icterus, oral mucosa moist Cardiovascular: No JVD. S1-S2 present, rhythmic, no gallops, rubs, or murmurs. Trace  lower extremity edema. Pulmonary: positive breath sounds bilaterally, decreased air movement, no wheezing, rhonchi or rales. Gastrointestinal. Abdomen with  positive distention, tender to palpation, tympanic to percussion, with decreased bowel sounds no organomegaly. No guarding or rebound.  Skin. No rashes Musculoskeletal: no joint deformities     Data  Reviewed: I have personally reviewed following labs and imaging studies  CBC: Recent Labs  Lab 01/17/18 0430  WBC 9.2  NEUTROABS 6.5  HGB 11.8*  HCT 38.3  MCV 90.5  PLT 876*   Basic Metabolic Panel: Recent Labs  Lab 01/17/18 0430 01/19/18 0646 01/19/18 1321 01/20/18 0400 01/22/18 0630  NA 134* 136 136  --  138  K 3.8 3.6 3.8  --  3.3*  CL 103 107 107  --  104  CO2 20* 22 21*  --  26  GLUCOSE 78 174* 205*  --  103*  BUN 9 10 10   --  6*  CREATININE 0.65 0.54 0.73  --  0.57  CALCIUM 7.9* 7.7* 7.9*  --  8.1*  MG  --   --   --  2.1 2.2  PHOS  --   --   --  1.3* 2.8   GFR: Estimated Creatinine Clearance: 55.8 mL/min (by C-G formula based on SCr of 0.57 mg/dL). Liver Function Tests: Recent Labs  Lab 01/17/18 0430  AST 25  ALT 33  ALKPHOS 140*  BILITOT 0.7  PROT 5.9*  ALBUMIN 2.6*   No results for input(s): LIPASE, AMYLASE in the last 168 hours. No results for input(s): AMMONIA in the last 168 hours. Coagulation Profile: Recent Labs  Lab 01/17/18 0430  INR 1.11   Cardiac Enzymes: No results for input(s): CKTOTAL, CKMB, CKMBINDEX, TROPONINI in the last 168 hours. BNP (last 3 results) No results for input(s): PROBNP in the last 8760 hours. HbA1C: No results for input(s): HGBA1C in the last 72 hours. CBG: No results for input(s): GLUCAP in the last 168 hours. Lipid Profile: No results for input(s): CHOL, HDL, LDLCALC, TRIG, CHOLHDL, LDLDIRECT in the last 72 hours. Thyroid Function Tests: No results for input(s): TSH, T4TOTAL, FREET4, T3FREE, THYROIDAB in the last 72 hours. Anemia Panel: No results for input(s): VITAMINB12, FOLATE, FERRITIN, TIBC, IRON, RETICCTPCT in the last 72 hours.    Radiology Studies: I have reviewed all of the imaging during this hospital visit personally     Scheduled Meds: . docusate sodium  100 mg Oral BID  . fluticasone  2 spray Each Nare Daily  . hydrocortisone   Rectal BID  . levothyroxine  75 mcg Oral Daily  .  lidocaine      . losartan  25 mg Oral Daily  . metoprolol succinate  25 mg Oral Daily  . pantoprazole  40 mg Oral Daily  . polyethylene glycol  17 g Oral Daily  . senna  1 tablet Oral BID  . simethicone  80 mg Oral Once  . simvastatin  20 mg Oral QHS   Continuous Infusions: . sodium chloride 75 mL/hr at 01/23/18 1228  . sodium chloride    . sodium chloride    . famotidine (PEPCID) IV       LOS: 7 days        Wylie Coon Gerome Apley, MD [facility] Pager [phone]

## 2018-01-23 NOTE — Progress Notes (Signed)
Initial Nutrition Assessment   INTERVENTION:   Calorie Count -d/c pt now NPO  Will address nutritional needs once diet is advanced. If unable to advance diet, may need to consider nutrition support.  NUTRITION DIAGNOSIS:   Inadequate oral intake related to inability to eat(SBO) as evidenced by NPO status.  GOAL:   Patient will meet greater than or equal to 90% of their needs  MONITOR:   Diet advancement, Labs, Weight trends, I & O's  REASON FOR ASSESSMENT:   Consult Calorie Count  ASSESSMENT:   73 year old female who presented with abdominal pain, she does have significant past medical history for ovarian cancer, complicated with peritoneal carcinomatosis and malignant ascites.  48 hour Calorie Count was ordered for 1/13 and 1/14. Pt was on a soft diet. 1/13: B: 622 kcal, 18g protein L: 292 kcal, 13g protein  Pt has since developed N/V and made NPO. Pt is s/p paracentesis today, yield: 450 ml. Since admission pt was NPO or on clear liquids from 1/6 to 1/12.   Per weight history, pt's weight has remained stable since May 2019.   Medications: Zofran PRN Labs reviewed: Low K   NUTRITION - FOCUSED PHYSICAL EXAM:  Deferred given   Diet Order:   Diet Order    None      EDUCATION NEEDS:   Not appropriate for education at this time  Skin:  Skin Assessment: Reviewed RN Assessment  Last BM:  1/11  Height:   Ht Readings from Last 1 Encounters:  01/19/18 5\' 2"  (1.575 m)    Weight:   Wt Readings from Last 1 Encounters:  01/11/18 65.8 kg    Ideal Body Weight:  50 kg  BMI:  Body mass index is 26.52 kg/m.  Estimated Nutritional Needs:   Kcal:  1700-1900  Protein:  75-85g  Fluid:  1.8L/day  Clayton Bibles, MS, RD, LDN [facility] Pager: [phone] After Hours Pager: [phone]

## 2018-01-23 NOTE — Progress Notes (Signed)
I reviewed the abdominal x-ray with the patient We discussed the risk and benefit of NG tube insertion to decompress her intestinal tract and she agreed to proceed. Her Lovenox is placed on hold for now in anticipation for possible ultrasound paracentesis in the near future.

## 2018-01-24 LAB — BASIC METABOLIC PANEL
Anion gap: 9 (ref 5–15)
BUN: 13 mg/dL (ref 8–23)
CO2: 25 mmol/L (ref 22–32)
Calcium: 7.7 mg/dL — ABNORMAL LOW (ref 8.9–10.3)
Chloride: 101 mmol/L (ref 98–111)
Creatinine, Ser: 0.54 mg/dL (ref 0.44–1.00)
GFR calc Af Amer: >60 mL/min (ref >60)
Glucose, Bld: 99 mg/dL (ref 70–99)
Potassium: 3.4 mmol/L — ABNORMAL LOW (ref 3.5–5.1)
Sodium: 135 mmol/L (ref 135–145)

## 2018-01-24 LAB — CBC WITH DIFFERENTIAL/PLATELET
Abs Immature Granulocytes: 0.01 10*3/uL (ref 0.00–0.07)
Basophils Absolute: 0 10*3/uL (ref 0.0–0.1)
Basophils Relative: 0 %
Eosinophils Absolute: 0.2 10*3/uL (ref 0.0–0.5)
Eosinophils Relative: 8 %
HEMATOCRIT: 32.6 % — AB (ref 36.0–46.0)
Hemoglobin: 10.2 g/dL — ABNORMAL LOW (ref 12.0–15.0)
Immature Granulocytes: 0 %
Lymphocytes Relative: 34 %
Lymphs Abs: 0.8 10*3/uL (ref 0.7–4.0)
MCH: 28.5 pg (ref 26.0–34.0)
MCHC: 31.3 g/dL (ref 30.0–36.0)
MCV: 91.1 fL (ref 80.0–100.0)
Monocytes Absolute: 0.1 10*3/uL (ref 0.1–1.0)
Monocytes Relative: 3 %
Neutro Abs: 1.2 10*3/uL — ABNORMAL LOW (ref 1.7–7.7)
Neutrophils Relative %: 55 %
Platelets: 270 10*3/uL (ref 150–400)
RBC: 3.58 MIL/uL — ABNORMAL LOW (ref 3.87–5.11)
RDW: 13.3 % (ref 11.5–15.5)
WBC: 2.2 10*3/uL — ABNORMAL LOW (ref 4.0–10.5)
nRBC: 0 % (ref 0.0–0.2)

## 2018-01-24 LAB — MAGNESIUM: Magnesium: 2.1 mg/dL (ref 1.7–2.4)

## 2018-01-24 MED ORDER — TBO-FILGRASTIM 300 MCG/0.5ML ~~LOC~~ SOSY
300.0000 ug | PREFILLED_SYRINGE | Freq: Every day | SUBCUTANEOUS | Status: DC
  Administered 2018-01-24 – 2018-01-26 (×3): 300 ug via SUBCUTANEOUS
  Filled 2018-01-24 (×3): qty 0.5

## 2018-01-24 MED ORDER — METOPROLOL TARTRATE 5 MG/5ML IV SOLN
5.0000 mg | Freq: Once | INTRAVENOUS | Status: AC
  Administered 2018-01-24: 5 mg via INTRAVENOUS
  Filled 2018-01-24: qty 5

## 2018-01-24 MED ORDER — DILTIAZEM HCL-DEXTROSE 100-5 MG/100ML-% IV SOLN (PREMIX)
5.0000 mg/h | INTRAVENOUS | Status: DC
  Administered 2018-01-24 – 2018-01-25 (×2): 5 mg/h via INTRAVENOUS
  Administered 2018-01-26: 6 mg/h via INTRAVENOUS
  Administered 2018-01-26: 5 mg/h via INTRAVENOUS
  Administered 2018-01-27: 6 mg/h via INTRAVENOUS
  Filled 2018-01-24 (×5): qty 100

## 2018-01-24 MED ORDER — ACETAMINOPHEN 10 MG/ML IV SOLN
1000.0000 mg | Freq: Four times a day (QID) | INTRAVENOUS | Status: AC
  Administered 2018-01-24 – 2018-01-25 (×4): 1000 mg via INTRAVENOUS
  Filled 2018-01-24 (×4): qty 100

## 2018-01-24 MED ORDER — LEVOTHYROXINE SODIUM 100 MCG/5ML IV SOLN
37.5000 ug | Freq: Every day | INTRAVENOUS | Status: DC
  Administered 2018-01-25 – 2018-01-26 (×2): 37.5 ug via INTRAVENOUS
  Filled 2018-01-24 (×3): qty 5

## 2018-01-24 MED ORDER — SODIUM CHLORIDE 0.9% FLUSH
10.0000 mL | INTRAVENOUS | Status: DC | PRN
  Administered 2018-01-24: 30 mL
  Administered 2018-01-30 – 2018-02-02 (×2): 10 mL
  Administered 2018-02-02: 20 mL
  Filled 2018-01-24 (×4): qty 40

## 2018-01-24 MED ORDER — DILTIAZEM LOAD VIA INFUSION
15.0000 mg | Freq: Once | INTRAVENOUS | Status: AC
  Administered 2018-01-24: 15 mg via INTRAVENOUS
  Filled 2018-01-24: qty 15

## 2018-01-24 MED ORDER — METOPROLOL TARTRATE 5 MG/5ML IV SOLN
10.0000 mg | Freq: Four times a day (QID) | INTRAVENOUS | Status: DC
  Administered 2018-01-24 – 2018-01-27 (×12): 10 mg via INTRAVENOUS
  Filled 2018-01-24 (×12): qty 10

## 2018-01-24 MED ORDER — LABETALOL HCL 5 MG/ML IV SOLN
5.0000 mg | INTRAVENOUS | Status: DC | PRN
  Administered 2018-01-24 – 2018-01-28 (×2): 5 mg via INTRAVENOUS
  Filled 2018-01-24 (×3): qty 4

## 2018-01-24 MED ORDER — POTASSIUM CHLORIDE 10 MEQ/100ML IV SOLN
10.0000 meq | INTRAVENOUS | Status: AC
  Administered 2018-01-24 (×4): 10 meq via INTRAVENOUS
  Filled 2018-01-24 (×4): qty 100

## 2018-01-24 NOTE — Progress Notes (Signed)
[place]   DOB:November 10, 1945   OZ#:[id number]    Assessment & Plan:  Locally advanced cancer with carcinomatosis, suspicious for ovarian cancer She tolerated cycle 1 day 1 of carboplatin and Taxol on January 18, 2018.  Continue supportive care  Nausea, vomiting, bloating and constipation Her symptoms are most consistent with carcinomatosis/partial small bowel obstruction I changed her diet back to n.p.o. with ice chips only Repeat x-ray of the abdomen on January 23, 2018 showed persistent signs of bowel obstruction.  NG tube was placed.  She has good symptomatic relief  Malignant ascites status post therapeutic paracentesis on January 15, 2018 She underwent repeat paracentesis on January 23, 2018. She felt better today  Severe protein calorie malnutrition Due to her nausea and vomiting, she is placed back on n.p.o.  Mild leukopenia Due to recent chemotherapy She is at high risk for infection I will start her on daily G-CSF support until [facility] is greater than 1.5  Paroxysmal atrial fibrillation She is on telemetry unit along with beta-blocker I will defer to primary service There is no contraindication for her to be on anticoagulation therapy as indicated  Discharge planning Not ready for discharge due to unresolved bowel obstructive symptoms Will follow.  Case is discussed with primary service and GYN oncology service  Heath Lark, MD 01/24/2018  9:24 AM   Subjective:  Her abdominal distention has improved since NG tube insertion.  Unfortunately, the patient developed paroxysmal atrial fibrillation and has been moved to telemetry unit.  She denies chest pain or shortness of breath today She also underwent therapeutic paracentesis but only approximately 500 cc of fluid was removed Overall, her abdominal pain, distention and nausea have improved compared to yesterday  Objective:  Vitals:   01/23/18 2053 01/24/18 0507  BP: (!) 147/95 125/86  Pulse: 81 (!) 114  Resp: 18 20   Temp: 98 F (36.7 C) 98.1 F (36.7 C)  SpO2: 95% 95%     Intake/Output Summary (Last 24 hours) at 01/24/2018 0924 Last data filed at 01/24/2018 0600 Gross per 24 hour  Intake 3963.76 ml  Output 401 ml  Net 3562.76 ml    GENERAL:alert, no distress and comfortable.  NG tube in situ SKIN: skin color, texture, turgor are normal, no rashes or significant lesions EYES: normal, Conjunctiva are pink and non-injected, sclera clear OROPHARYNX:no exudate, no erythema and lips, buccal mucosa, and tongue normal  NECK: supple, thyroid normal size, non-tender, without nodularity LYMPH:  no palpable lymphadenopathy in the cervical, axillary or inguinal LUNGS: clear to auscultation and percussion with normal breathing effort HEART: Irregular rate and rhythm, no murmurs and no lower extremity edema ABDOMEN:abdomen soft, persistent mild distention, reduced bowel sounds Musculoskeletal:no cyanosis of digits and no clubbing  NEURO: alert & oriented x 3 with fluent speech, no focal motor/sensory deficits   Labs:  Lab Results  Component Value Date   WBC 2.2 (L) 01/24/2018   HGB 10.2 (L) 01/24/2018   HCT 32.6 (L) 01/24/2018   MCV 91.1 01/24/2018   PLT 270 01/24/2018   NEUTROABS 1.2 (L) 01/24/2018    Lab Results  Component Value Date   NA 135 01/24/2018   K 3.4 (L) 01/24/2018   CL 101 01/24/2018   CO2 25 01/24/2018    Studies:  Dg Abd 1 View  Result Date: 01/23/2018 CLINICAL DATA:  Nausea, vomiting, nasogastric tube placement EXAM: ABDOMEN - 1 VIEW COMPARISON:  Portable exam 1509 hours compared to 01/23/2018 at 1110 hours FINDINGS: Nasogastric tube tip projects over  distal gastric antrum. Dilated gas-filled large and small bowel loops in the upper abdomen. LEFT basilar atelectasis and tiny LEFT pleural effusion. Atherosclerotic calcification aorta. Port-A-Cath tip projects over SVC. No acute osseous findings. IMPRESSION: Persistent gaseous distention of bowel loops in the upper abdomen. Tip of  nasogastric tube projects over distal gastric antrum. LEFT basilar atelectasis and tiny LEFT pleural effusion. Electronically Signed   By: Lavonia Dana M.D.   On: 01/23/2018 16:01   US Paracentesis  Result Date: 01/23/2018 INDICATION: Patient with history of metastatic gyn/?ovarian carcinoma with recurrent malignant ascites. Request made for therapeutic paracentesis. EXAM: ULTRASOUND GUIDED THERAPEUTIC PARACENTESIS MEDICATIONS: None COMPLICATIONS: None immediate. PROCEDURE: Informed written consent was obtained from the patient after a discussion of the risks, benefits and alternatives to treatment. A timeout was performed prior to the initiation of the procedure. Initial ultrasound scanning demonstrates a small amount of ascites within the right mid to lower abdominal quadrant. The right mid to lower abdomen was prepped and draped in the usual sterile fashion. 1% lidocaine was used for local anesthesia. Following this, a 6 Fr Safe-T-Centesis catheter was introduced. An ultrasound image was saved for documentation purposes. The paracentesis was performed. The catheter was removed and a dressing was applied. The patient tolerated the procedure well without immediate post procedural complication. FINDINGS: A total of approximately 450 cc of hazy, yellow fluid was removed. IMPRESSION: Successful ultrasound-guided therapeutic paracentesis yielding 450 cc of peritoneal fluid. Read by: Rowe Robert, PA-C Electronically Signed   By: Aletta Edouard M.D.   On: 01/23/2018 12:19   Dg Abd 2 Views  Result Date: 01/23/2018 CLINICAL DATA:  Follow-up small bowel obstruction EXAM: ABDOMEN - 2 VIEW COMPARISON:  CT abdomen 01/15/2017, KUB 01/17/2017 FINDINGS: There is gaseous distention of small bowel and colon. There are multiple small bowel air-fluid levels. There are a few colonic air-fluid levels in the ascending colon. There is no evidence of pneumoperitoneum, portal venous gas or pneumatosis. There are no pathologic  calcifications along the expected course of the ureters. The osseous structures are unremarkable. IMPRESSION: 1. Gaseous distention of small bowel and colon with a few small bowel and colonic air-fluid levels. This appearance can be seen with an ileus versus partial small bowel obstruction. Electronically Signed   By: Kathreen Devoid   On: 01/23/2018 11:26

## 2018-01-24 NOTE — Progress Notes (Signed)
PROGRESS NOTE    Haley [name]  MGQ:[id number] DOB: Oct 10, 1945 DOA: 01/15/2018 PCP: [place], Iria G, MD    Brief Narrative:  73 year old female who presented with abdominal pain, she does have significant past medical history for ovarian cancer,complicated with peritoneal carcinomatosis and malignant ascites. Patient developed abdominal pain, that was refractory to analgesics and paracentesis. She was afebrile, blood pressure 112/62, heart rate 65, respiratory rate 18, oxygen saturation 95%, she had dry mucous membranes, lungs clear to auscultation bilaterally, heart S1-S2 present and rhythmic,her abdomen was mildly distended, tender to palpation no masses palpable, no lower extremity edema. Her abdomen and pelvis CTshowed dilated fluid filled loops of small bowel with transition point likely in the right adnexa adjacent to, cystic mass.  Patient was admitted to the hospital with working diagnosis of small bowel obstruction.  Assessment & Plan:   Principal Problem:   Partial small bowel obstruction (HCC) Active Problems:   Hypothyroidism   GERD   PAF (paroxysmal atrial fibrillation) (HCC)   Carcinomatosis (HCC)   Abdominal pain   Ovarian cancer ([place]) suspected  1. Small bowel obstruction due to ovarian cancer with carcinomatosis. Noted to have worsening symptoms, distention, pain, nausea and vomiting. NG tube placed and connected to low intermittent suction. Currently NPO. Continue as needed analgesics IV and IV antiemetics. Stool noted today with trace bowel sounds. Possible trial at clamping tube in the next 24-48hrs  2. HTN.Hold on oral antihypertensive medications for now. Cont on IV beta blocker per below  3. Dyslipidemia.Hold on oral medications for now.   4. Hypothyroid.Will continue with levothyroxine IV. Stable at this time  5. Paroxysmal atrial fibrillation.Noted to be in RVR despite scheduled IV beta blocker and added PRN labetalol IV. HR remains in the  160-170's. Will start pt on IV cardizem and transfer to SDU for closer monitoring  6. Hypophosphatemiaand hypokalemia.Continue k correction with Kcl, will follow on renal panel in am. K today 3.4. Replaced. Mg reviewed, normal  7. New back pain. Continue as needed IV analgesics.    DVT prophylaxis: SCD's Code Status: Full Family Communication: Pt in room, family at bedside Disposition Plan: Uncertain at this time  Consultants:   Oncology  Gyn Onc  Procedures:     Antimicrobials: Anti-infectives (From admission, onward)   Start     Dose/Rate Route Frequency Ordered Stop   01/17/18 1200  ceFAZolin (ANCEF) IVPB 2g/100 mL premix     2 g 200 mL/hr over 30 Minutes Intravenous To Radiology 01/17/18 1156 01/17/18 1330   01/16/18 0100  cefTRIAXone (ROCEPHIN) 2 g in sodium chloride 0.9 % 100 mL IVPB  Status:  Discontinued     2 g 200 mL/hr over 30 Minutes Intravenous Every 24 hours 01/16/18 0032 01/18/18 1503   01/15/18 1630  ceFEPIme (MAXIPIME) 2 g in sodium chloride 0.9 % 100 mL IVPB     2 g 200 mL/hr over 30 Minutes Intravenous  Once 01/15/18 1627 01/15/18 1828       Subjective: Complaining of discomfort with NG tube  Objective: Vitals:   01/23/18 2041 01/23/18 2053 01/24/18 0507 01/24/18 1339  BP: (!) 135/109 (!) 147/95 125/86 (!) 147/89  Pulse: (!) 130 81 (!) 114 (!) 149  Resp: 14 18 20 16   Temp: 98 F (36.7 C) 98 F (36.7 C) 98.1 F (36.7 C) 99.7 F (37.6 C)  TempSrc: Axillary Oral Oral Oral  SpO2: 92% 95% 95% 92%  Height:        Intake/Output Summary (Last 24 hours) at  01/24/2018 1746 Last data filed at 01/24/2018 1411 Gross per 24 hour  Intake 1620.46 ml  Output 550 ml  Net 1070.46 ml   There were no vitals filed for this visit.  Examination:  General exam: Appears calm and comfortable, NG in place Respiratory system: Clear to auscultation. Respiratory effort normal. Cardiovascular system: S1 & S2 heard, tachycardic, Gastrointestinal system:  Abdomen is nondistended, soft and nontender. No organomegaly or masses felt. Normal bowel sounds heard. Central nervous system: Alert and oriented. No focal neurological deficits. Extremities: Symmetric 5 x 5 power. Skin: No rashes, lesions Psychiatry: Judgement and insight appear normal. Mood & affect appropriate.   Data Reviewed: I have personally reviewed following labs and imaging studies  CBC: Recent Labs  Lab 01/24/18 0630  WBC 2.2*  NEUTROABS 1.2*  HGB 10.2*  HCT 32.6*  MCV 91.1  PLT 782   Basic Metabolic Panel: Recent Labs  Lab 01/19/18 0646 01/19/18 1321 01/20/18 0400 01/22/18 0630 01/24/18 0630  NA 136 136  --  138 135  K 3.6 3.8  --  3.3* 3.4*  CL 107 107  --  104 101  CO2 22 21*  --  26 25  GLUCOSE 174* 205*  --  103* 99  BUN 10 10  --  6* 13  CREATININE 0.54 0.73  --  0.57 0.54  CALCIUM 7.7* 7.9*  --  8.1* 7.7*  MG  --   --  2.1 2.2 2.1  PHOS  --   --  1.3* 2.8  --    GFR: Estimated Creatinine Clearance: 55.8 mL/min (by C-G formula based on SCr of 0.54 mg/dL). Liver Function Tests: No results for input(s): AST, ALT, ALKPHOS, BILITOT, PROT, ALBUMIN in the last 168 hours. No results for input(s): LIPASE, AMYLASE in the last 168 hours. No results for input(s): AMMONIA in the last 168 hours. Coagulation Profile: No results for input(s): INR, PROTIME in the last 168 hours. Cardiac Enzymes: No results for input(s): CKTOTAL, CKMB, CKMBINDEX, TROPONINI in the last 168 hours. BNP (last 3 results) No results for input(s): PROBNP in the last 8760 hours. HbA1C: No results for input(s): HGBA1C in the last 72 hours. CBG: No results for input(s): GLUCAP in the last 168 hours. Lipid Profile: No results for input(s): CHOL, HDL, LDLCALC, TRIG, CHOLHDL, LDLDIRECT in the last 72 hours. Thyroid Function Tests: No results for input(s): TSH, T4TOTAL, FREET4, T3FREE, THYROIDAB in the last 72 hours. Anemia Panel: No results for input(s): VITAMINB12, FOLATE, FERRITIN,  TIBC, IRON, RETICCTPCT in the last 72 hours. Sepsis Labs: No results for input(s): PROCALCITON, LATICACIDVEN in the last 168 hours.  Recent Results (from the past 240 hour(s))  Culture, body fluid-bottle     Status: None   Collection Time: 01/15/18  2:35 PM  Result Value Ref Range Status   Specimen Description FLUID PERITONEAL  Final   Special Requests BOTTLES DRAWN AEROBIC AND ANAEROBIC  Final   Culture   Final    NO GROWTH 5 DAYS Performed at [facility], [place] [address] [place], [place] [address]    Report Status 01/20/2018 FINAL  Final  Gram stain     Status: None   Collection Time: 01/15/18  2:35 PM  Result Value Ref Range Status   Specimen Description FLUID PERITONEAL  Final   Special Requests NONE  Final   Gram Stain   Final    WBC PRESENT,BOTH PMN AND MONONUCLEAR NO ORGANISMS SEEN CYTOSPIN SMEAR Performed at [facility], [address] [place], [place] [address]    Report Status 01/15/2018 FINAL  Final  Culture, blood (routine x 2)     Status: None   Collection Time: 01/15/18  4:39 PM  Result Value Ref Range Status   Specimen Description   Final    BLOOD LEFT WRIST Performed at [facility] [address], [place], [place] [address]    Special Requests   Final    BOTTLES DRAWN AEROBIC AND ANAEROBIC Blood Culture adequate volume Performed at [facility] [address], [place], [place] [address]    Culture   Final    NO GROWTH 5 DAYS Performed at [facility], [place] [address], [place], [place] [address]    Report Status 01/20/2018 FINAL  Final  Culture, blood (routine x 2)     Status: None   Collection Time: 01/15/18  4:50 PM  Result Value Ref Range Status   Specimen Description   Final    BLOOD LEFT ANTECUBITAL Performed at [facility] [address] [place], [place] [address]    Special Requests   Final    BOTTLES DRAWN AEROBIC ONLY Blood Culture adequate volume Performed at  [facility] [address] [place], [place] [address]    Culture   Final    NO GROWTH 5 DAYS Performed at [facility], [place] [address] [place], [place] [address]    Report Status 01/20/2018 FINAL  Final     Radiology Studies: Dg Abd 1 View  Result Date: 01/23/2018 CLINICAL DATA:  Nausea, vomiting, nasogastric tube placement EXAM: ABDOMEN - 1 VIEW COMPARISON:  Portable exam 1509 hours compared to 01/23/2018 at 1110 hours FINDINGS: Nasogastric tube tip projects over distal gastric antrum. Dilated gas-filled large and small bowel loops in the upper abdomen. LEFT basilar atelectasis and tiny LEFT pleural effusion. Atherosclerotic calcification aorta. Port-A-Cath tip projects over SVC. No acute osseous findings. IMPRESSION: Persistent gaseous distention of bowel loops in the upper abdomen. Tip of nasogastric tube projects over distal gastric antrum. LEFT basilar atelectasis and tiny LEFT pleural effusion. Electronically Signed   By: Lavonia Dana M.D.   On: 01/23/2018 16:01   US Paracentesis  Result Date: 01/23/2018 INDICATION: Patient with history of metastatic gyn/?ovarian carcinoma with recurrent malignant ascites. Request made for therapeutic paracentesis. EXAM: ULTRASOUND GUIDED THERAPEUTIC PARACENTESIS MEDICATIONS: None COMPLICATIONS: None immediate. PROCEDURE: Informed written consent was obtained from the patient after a discussion of the risks, benefits and alternatives to treatment. A timeout was performed prior to the initiation of the procedure. Initial ultrasound scanning demonstrates a small amount of ascites within the right mid to lower abdominal quadrant. The right mid to lower abdomen was prepped and draped in the usual sterile fashion. 1% lidocaine was used for local anesthesia. Following this, a 6 Fr Safe-T-Centesis catheter was introduced. An ultrasound image was saved for documentation purposes. The paracentesis was performed. The catheter was removed and a  dressing was applied. The patient tolerated the procedure well without immediate post procedural complication. FINDINGS: A total of approximately 450 cc of hazy, yellow fluid was removed. IMPRESSION: Successful ultrasound-guided therapeutic paracentesis yielding 450 cc of peritoneal fluid. Read by: Rowe Robert, PA-C Electronically Signed   By: Aletta Edouard M.D.   On: 01/23/2018 12:19   Dg Abd 2 Views  Result Date: 01/23/2018 CLINICAL DATA:  Follow-up small bowel obstruction EXAM: ABDOMEN - 2 VIEW COMPARISON:  CT abdomen 01/15/2017, KUB 01/17/2017 FINDINGS: There is gaseous  distention of small bowel and colon. There are multiple small bowel air-fluid levels. There are a few colonic air-fluid levels in the ascending colon. There is no evidence of pneumoperitoneum, portal venous gas or pneumatosis. There are no pathologic calcifications along the expected course of the ureters. The osseous structures are unremarkable. IMPRESSION: 1. Gaseous distention of small bowel and colon with a few small bowel and colonic air-fluid levels. This appearance can be seen with an ileus versus partial small bowel obstruction. Electronically Signed   By: Kathreen Devoid   On: 01/23/2018 11:26    Scheduled Meds: . docusate sodium  100 mg Oral BID  . fluticasone  2 spray Each Nare Daily  . hydrocortisone   Rectal BID  . levothyroxine  35 mcg Intravenous Daily  . metoprolol tartrate  10 mg Intravenous Q6H  . polyethylene glycol  17 g Oral Daily  . senna  1 tablet Oral BID  . Tbo-filgastrim (GRANIX) SQ  300 mcg Subcutaneous q1800   Continuous Infusions: . sodium chloride 75 mL/hr at 01/24/18 0600  . sodium chloride    . sodium chloride    . acetaminophen Stopped (01/24/18 1346)  . famotidine (PEPCID) IV    . famotidine (PEPCID) IV Stopped (01/23/18 1822)     LOS: 8 days   Marylu Lund, MD [facility] Pager On Amion  If 7PM-7AM, please contact night-coverage 01/24/2018, 5:46 PM

## 2018-01-24 NOTE — Progress Notes (Signed)
1432/1432-01  Past Medical History:  Diagnosis Date  . Allergy    SEASONAL  . Cataract    BILATERAL  . Fibromyalgia   . GERD (gastroesophageal reflux disease)   . H/O echocardiogram 04/28/08   EF>55% trace mitral regurgitation, No significant valvular pathology  . Hemorrhoids    internal  . History of stress test 02/08/2011   Normal Myocardial perfusion study, this is a low risk scan, No prior study available for comparison  . Hyperlipidemia   . MVP (mitral valve prolapse)    mild and MR  . OA (osteoarthritis)   . Osteoporosis   . Paroxysmal A-fib ([place])   . Sleep apnea    does not use prescribed CPAP  . Thyroid disease    HYPO  . Varicosities of leg   . Vitamin D deficiency      Subjective: Patient reports small soft BM this morning. Denies nausea.  Thinks the NGT made her abdominal discomfort better. Passing flatus. Complaint today is back pain and she is wondering what pain medication she can have now.  Objective: Vital signs in last 24 hours: Temp:  [97.7 F (36.5 C)-98.2 F (36.8 C)] 98.1 F (36.7 C) (01/15 0507) Pulse Rate:  [81-130] 114 (01/15 0507) Resp:  [14-20] 20 (01/15 0507) BP: (125-159)/(74-109) 125/86 (01/15 0507) SpO2:  [92 %-96 %] 95 % (01/15 0507) Last BM Date: 01/20/18  Intake/Output from previous day: 01/14 0701 - 01/15 0700 In: 3963.8 [P.O.:240; I.V.:3673.8; IV Piggyback:50] Out: 401 [Urine:401] Patient Vitals for the past 24 hrs:  Urine Occurrence  01/24/18 0929 1  01/24/18 0600 1   No NGT output documented. There is ~100cc bilious fluid in cannister. Nursing states there was minimal there when she was transferred to the floor  Physical Examination: General: alert and no distress Resp: clear to auscultation bilaterally Cardio: irregular GI: soft, NTND. Hypoactive BS. No rebound or guarding Extremities: extremities normal, atraumatic, no cyanosis or edema  Labs: WBC/Hgb/Hct/Plts:  2.2/10.2/32.6/270 (01/15 0630)  BUN/Cr/glu/ALT/AST/amyl/lip:  13/0.54/--/--/--/--/-- (01/15 0630)  CBC Latest Ref Rng & Units 01/24/2018 01/17/2018 01/16/2018  WBC 4.0 - 10.5 K/uL 2.2(L) 9.2 6.0  Hemoglobin 12.0 - 15.0 g/dL 10.2(L) 11.8(L) 10.8(L)  Hematocrit 36.0 - 46.0 % 32.6(L) 38.3 34.4(L)  Platelets 150 - 400 K/uL 270 456(H) 405(H)   BMP Latest Ref Rng & Units 01/24/2018 01/22/2018 01/19/2018  Glucose 70 - 99 mg/dL 99 103(H) 205(H)  BUN 8 - 23 mg/dL 13 6(L) 10  Creatinine 0.44 - 1.00 mg/dL 0.54 0.57 0.73  Sodium 135 - 145 mmol/L 135 138 136  Potassium 3.5 - 5.1 mmol/L 3.4(L) 3.3(L) 3.8  Chloride 98 - 111 mmol/L 101 104 107  CO2 22 - 32 mmol/L 25 26 21(L)  Calcium 8.9 - 10.3 mg/dL 7.7(L) 8.1(L) 7.9(L)      Assessment/Plan:  73 y.o.  With ovarian CA and partial SBO s/p cycle 1 neoadjuvant chemo  Pain:  Added IV Tylenol for the time being while NGT present. Informed patient she could get Dilaudid again ~1pm and it looks like PRN MSO4 is on chart.   Neuro: no issues  Pulm: no issues  Heme: small drop in wbc. Dr. Alvy Bimler is following with plans for GCSF  CV:  Afib - on tele. Per primary team  Prophylaxis: I am not opposed to her being on anticoagulant as needed. Defer to primary team. I had written for Lovenox earlier in week which appears to be on hold now.  GI:    . C/w partial  obstruction. Having BM/flatus.  . Difficult to assess output/relevance of NGT since no documentation of volumes.  . Patient states she feels better with NGT. . I have asked the nurses to document the output and then I think if <400-500cc by tomorrow am then we could remove  GU: . urine output - having voids some of which volume not documented.  FEN:  . IVF agree with maintenance fluids since NGT/NPO; per primary team . Elec Last mag level 2 days ago. Will add on today. Watch K+ given NGT. Marland Kitchen Diet NPO  ID: currently afebrile   LOS: 8 days    Isabel Caprice 01/24/2018, 12:26 PM

## 2018-01-25 ENCOUNTER — Encounter: Payer: PPO | Admitting: Physical Therapy

## 2018-01-25 ENCOUNTER — Inpatient Hospital Stay (HOSPITAL_COMMUNITY): Payer: PPO

## 2018-01-25 LAB — BASIC METABOLIC PANEL
Anion gap: 8 (ref 5–15)
BUN: 11 mg/dL (ref 8–23)
CO2: 25 mmol/L (ref 22–32)
Calcium: 7.8 mg/dL — ABNORMAL LOW (ref 8.9–10.3)
Chloride: 98 mmol/L (ref 98–111)
Creatinine, Ser: 0.47 mg/dL (ref 0.44–1.00)
GFR calc Af Amer: >60 mL/min (ref >60)
GLUCOSE: 80 mg/dL (ref 70–99)
POTASSIUM: 3.4 mmol/L — AB (ref 3.5–5.1)
Sodium: 131 mmol/L — ABNORMAL LOW (ref 135–145)

## 2018-01-25 LAB — CBC WITH DIFFERENTIAL/PLATELET
Abs Immature Granulocytes: 0.01 10*3/uL (ref 0.00–0.07)
Basophils Absolute: 0 10*3/uL (ref 0.0–0.1)
Basophils Relative: 1 %
EOS ABS: 0.2 10*3/uL (ref 0.0–0.5)
Eosinophils Relative: 9 %
HEMATOCRIT: 30.7 % — AB (ref 36.0–46.0)
Hemoglobin: 9.7 g/dL — ABNORMAL LOW (ref 12.0–15.0)
IMMATURE GRANULOCYTES: 0 %
Lymphocytes Relative: 29 %
Lymphs Abs: 0.8 10*3/uL (ref 0.7–4.0)
MCH: 28.2 pg (ref 26.0–34.0)
MCHC: 31.6 g/dL (ref 30.0–36.0)
MCV: 89.2 fL (ref 80.0–100.0)
Monocytes Absolute: 0.3 10*3/uL (ref 0.1–1.0)
Monocytes Relative: 10 %
Neutro Abs: 1.4 10*3/uL — ABNORMAL LOW (ref 1.7–7.7)
Neutrophils Relative %: 51 %
Platelets: 237 10*3/uL (ref 150–400)
RBC: 3.44 MIL/uL — ABNORMAL LOW (ref 3.87–5.11)
RDW: 13.2 % (ref 11.5–15.5)
WBC: 2.7 10*3/uL — ABNORMAL LOW (ref 4.0–10.5)
nRBC: 0 % (ref 0.0–0.2)

## 2018-01-25 MED ORDER — POTASSIUM CHLORIDE 10 MEQ/100ML IV SOLN
10.0000 meq | INTRAVENOUS | Status: AC
  Administered 2018-01-25 (×4): 10 meq via INTRAVENOUS
  Filled 2018-01-25 (×4): qty 100

## 2018-01-25 MED ORDER — INFLUENZA VAC SPLIT HIGH-DOSE 0.5 ML IM SUSY
0.5000 mL | PREFILLED_SYRINGE | INTRAMUSCULAR | Status: DC
  Filled 2018-01-25: qty 0.5

## 2018-01-25 MED ORDER — ALUM & MAG HYDROXIDE-SIMETH 200-200-20 MG/5ML PO SUSP
15.0000 mL | ORAL | Status: DC | PRN
  Administered 2018-01-27 – 2018-01-28 (×2): 15 mL via ORAL
  Filled 2018-01-25 (×3): qty 30

## 2018-01-25 MED ORDER — PHENOL 1.4 % MT LIQD
1.0000 | OROMUCOSAL | Status: DC | PRN
  Filled 2018-01-25: qty 177

## 2018-01-25 MED ORDER — PNEUMOCOCCAL VAC POLYVALENT 25 MCG/0.5ML IJ INJ
0.5000 mL | INJECTION | INTRAMUSCULAR | Status: DC
  Filled 2018-01-25: qty 0.5

## 2018-01-25 NOTE — Plan of Care (Signed)
  Problem: Education: Goal: Knowledge of General Education information will improve Description: Including pain rating scale, medication(s)/side effects and non-pharmacologic comfort measures Outcome: Progressing   Problem: Health Behavior/Discharge Planning: Goal: Ability to manage health-related needs will improve Outcome: Progressing   Problem: Clinical Measurements: Goal: Ability to maintain clinical measurements within normal limits will improve Outcome: Progressing Goal: Will remain free from infection Outcome: Progressing Goal: Diagnostic test results will improve Outcome: Progressing Goal: Respiratory complications will improve Outcome: Progressing Goal: Cardiovascular complication will be avoided Outcome: Progressing   Problem: Activity: Goal: Risk for activity intolerance will decrease Outcome: Progressing   Problem: Pain Managment: Goal: General experience of comfort will improve Outcome: Progressing   Problem: Safety: Goal: Ability to remain free from injury will improve Outcome: Progressing   

## 2018-01-25 NOTE — Care Management Important Message (Signed)
Important Message  Patient Details  Name: Haley [name] MRN: [id number] Date of Birth: 26-Feb-1945   Medicare Important Message Given:  Yes    Kerin Salen 01/25/2018, 11:20 AMImportant Message  Patient Details  Name: Haley [name] MRN: [id number] Date of Birth: 10/25/1945   Medicare Important Message Given:  Yes    Kerin Salen 01/25/2018, 11:20 AM

## 2018-01-25 NOTE — Progress Notes (Signed)
PROGRESS NOTE    Haley [name]  OZY:[id number] DOB: 1946-01-06 DOA: 01/15/2018 PCP: Martinique, Aidel G, MD    Brief Narrative:  73 year old female who presented with abdominal pain, she does have significant past medical history for ovarian cancer,complicated with peritoneal carcinomatosis and malignant ascites. Patient developed abdominal pain, that was refractory to analgesics and paracentesis. She was afebrile, blood pressure 112/62, heart rate 65, respiratory rate 18, oxygen saturation 95%, she had dry mucous membranes, lungs clear to auscultation bilaterally, heart S1-S2 present and rhythmic,her abdomen was mildly distended, tender to palpation no masses palpable, no lower extremity edema. Her abdomen and pelvis CTshowed dilated fluid filled loops of small bowel with transition point likely in the right adnexa adjacent to, cystic mass.  Patient was admitted to the hospital with working diagnosis of small bowel obstruction.  Assessment & Plan:   Principal Problem:   Partial small bowel obstruction (HCC) Active Problems:   Hypothyroidism   GERD   PAF (paroxysmal atrial fibrillation) (HCC)   Carcinomatosis (HCC)   Abdominal pain   Ovarian cancer ([place]) suspected  1. Small bowel obstruction due to ovarian cancer with carcinomatosis. Noted to have worsening symptoms, distention, pain, nausea and vomiting. NG tube placed and connected to low intermittent suction. Continued needed analgesics IV and IV antiemetics. NG clamped with trials of clears today. This AM, pt reported increased abd distension and nausea with popsicle. Gradually advance diet as tolerated  2. HTN.Hold on oral antihypertensive medications for now. Cont on IV beta blocker and cardizem per below  3. Dyslipidemia.Hold on oral medications for now.   4. Hypothyroid.Will continue with levothyroxine IV. Stable at this time  5. Paroxysmal atrial fibrillation.Noted to be in RVR despite scheduled IV beta blocker and  added PRN labetalol IV. HR now much improved with addition of cardizem gtt. Awaiting ability for pt to tolerate PO reliably before transitioning to PO cardizem  6. Hypophosphatemiaand hypokalemia.Continue k correction with Kcl, will follow on renal panel in am. K today 3.4. Will replace  7. New back pain. Continue as needed IV analgesics.    DVT prophylaxis: SCD's Code Status: Full Family Communication: Pt in room, family at bedside Disposition Plan: Uncertain at this time  Consultants:   Oncology  Gyn Onc  Procedures:     Antimicrobials: Anti-infectives (From admission, onward)   Start     Dose/Rate Route Frequency Ordered Stop   01/17/18 1200  ceFAZolin (ANCEF) IVPB 2g/100 mL premix     2 g 200 mL/hr over 30 Minutes Intravenous To Radiology 01/17/18 1156 01/17/18 1330   01/16/18 0100  cefTRIAXone (ROCEPHIN) 2 g in sodium chloride 0.9 % 100 mL IVPB  Status:  Discontinued     2 g 200 mL/hr over 30 Minutes Intravenous Every 24 hours 01/16/18 0032 01/18/18 1503   01/15/18 1630  ceFEPIme (MAXIPIME) 2 g in sodium chloride 0.9 % 100 mL IVPB     2 g 200 mL/hr over 30 Minutes Intravenous  Once 01/15/18 1627 01/15/18 1828      Subjective: Feeling mild nausea and abd distension with clears this AM  Objective: Vitals:   01/24/18 2000 01/24/18 2231 01/25/18 0549 01/25/18 1419  BP:  136/78 128/82 125/80  Pulse:  88 87 77  Resp:  16 16 16   Temp:  98.4 F (36.9 C) 97.8 F (36.6 C) 98.5 F (36.9 C)  TempSrc:  Oral Oral Oral  SpO2:  93% 95% 95%  Weight: 65.8 kg     Height: 5\' 2"  (1.575 m)  Intake/Output Summary (Last 24 hours) at 01/25/2018 1514 Last data filed at 01/25/2018 1418 Gross per 24 hour  Intake 360 ml  Output -  Net 360 ml   Filed Weights   01/24/18 2000  Weight: 65.8 kg    Examination: General exam: Awake, laying in bed, in nad Respiratory system: Normal respiratory effort, no wheezing Cardiovascular system: regular rate, s1,  s2 Gastrointestinal system: decreased BS, distended Central nervous system: CN2-12 grossly intact, strength intact Extremities: Perfused, no clubbing Skin: Normal skin turgor, no notable skin lesions seen Psychiatry: Mood normal // no visual hallucinations   Data Reviewed: I have personally reviewed following labs and imaging studies  CBC: Recent Labs  Lab 01/24/18 0630 01/25/18 0503  WBC 2.2* 2.7*  NEUTROABS 1.2* 1.4*  HGB 10.2* 9.7*  HCT 32.6* 30.7*  MCV 91.1 89.2  PLT 270 637   Basic Metabolic Panel: Recent Labs  Lab 01/19/18 0646 01/19/18 1321 01/20/18 0400 01/22/18 0630 01/24/18 0630 01/25/18 0503  NA 136 136  --  138 135 131*  K 3.6 3.8  --  3.3* 3.4* 3.4*  CL 107 107  --  104 101 98  CO2 22 21*  --  26 25 25   GLUCOSE 174* 205*  --  103* 99 80  BUN 10 10  --  6* 13 11  CREATININE 0.54 0.73  --  0.57 0.54 0.47  CALCIUM 7.7* 7.9*  --  8.1* 7.7* 7.8*  MG  --   --  2.1 2.2 2.1  --   PHOS  --   --  1.3* 2.8  --   --    GFR: Estimated Creatinine Clearance: 55.8 mL/min (by C-G formula based on SCr of 0.47 mg/dL). Liver Function Tests: No results for input(s): AST, ALT, ALKPHOS, BILITOT, PROT, ALBUMIN in the last 168 hours. No results for input(s): LIPASE, AMYLASE in the last 168 hours. No results for input(s): AMMONIA in the last 168 hours. Coagulation Profile: No results for input(s): INR, PROTIME in the last 168 hours. Cardiac Enzymes: No results for input(s): CKTOTAL, CKMB, CKMBINDEX, TROPONINI in the last 168 hours. BNP (last 3 results) No results for input(s): PROBNP in the last 8760 hours. HbA1C: No results for input(s): HGBA1C in the last 72 hours. CBG: No results for input(s): GLUCAP in the last 168 hours. Lipid Profile: No results for input(s): CHOL, HDL, LDLCALC, TRIG, CHOLHDL, LDLDIRECT in the last 72 hours. Thyroid Function Tests: No results for input(s): TSH, T4TOTAL, FREET4, T3FREE, THYROIDAB in the last 72 hours. Anemia Panel: No results for  input(s): VITAMINB12, FOLATE, FERRITIN, TIBC, IRON, RETICCTPCT in the last 72 hours. Sepsis Labs: No results for input(s): PROCALCITON, LATICACIDVEN in the last 168 hours.  Recent Results (from the past 240 hour(s))  Culture, blood (routine x 2)     Status: None   Collection Time: 01/15/18  4:39 PM  Result Value Ref Range Status   Specimen Description   Final    BLOOD LEFT WRIST Performed at [facility], [place] [address], [place], [place] [address]    Special Requests   Final    BOTTLES DRAWN AEROBIC AND ANAEROBIC Blood Culture adequate volume Performed at [facility] [address] [place], [place] [address]    Culture   Final    NO GROWTH 5 DAYS Performed at [facility], [place] [address] [place], [place] [address]    Report Status 01/20/2018 FINAL  Final  Culture, blood (routine x 2)  Status: None   Collection Time: 01/15/18  4:50 PM  Result Value Ref Range Status   Specimen Description   Final    BLOOD LEFT ANTECUBITAL Performed at [facility] [address] [place], [place] [address]    Special Requests   Final    BOTTLES DRAWN AEROBIC ONLY Blood Culture adequate volume Performed at [facility] [address], [place], [place] [address]    Culture   Final    NO GROWTH 5 DAYS Performed at [facility], [place] [address] [place], [place] [address]    Report Status 01/20/2018 FINAL  Final     Radiology Studies: Dg Abd 1 View  Result Date: 01/23/2018 CLINICAL DATA:  Nausea, vomiting, nasogastric tube placement EXAM: ABDOMEN - 1 VIEW COMPARISON:  Portable exam 1509 hours compared to 01/23/2018 at 1110 hours FINDINGS: Nasogastric tube tip projects over distal gastric antrum. Dilated gas-filled large and small bowel loops in the upper abdomen. LEFT basilar atelectasis and tiny LEFT pleural effusion. Atherosclerotic calcification aorta. Port-A-Cath tip projects over SVC. No acute  osseous findings. IMPRESSION: Persistent gaseous distention of bowel loops in the upper abdomen. Tip of nasogastric tube projects over distal gastric antrum. LEFT basilar atelectasis and tiny LEFT pleural effusion. Electronically Signed   By: Lavonia Dana M.D.   On: 01/23/2018 16:01    Scheduled Meds: . docusate sodium  100 mg Oral BID  . fluticasone  2 spray Each Nare Daily  . hydrocortisone   Rectal BID  . [START ON 01/26/2018] Influenza vac split quadrivalent PF  0.5 mL Intramuscular Tomorrow-1000  . levothyroxine  37.5 mcg Intravenous Daily  . metoprolol tartrate  10 mg Intravenous Q6H  . [START ON 01/26/2018] pneumococcal 23 valent vaccine  0.5 mL Intramuscular Tomorrow-1000  . polyethylene glycol  17 g Oral Daily  . senna  1 tablet Oral BID  . Tbo-filgastrim (GRANIX) SQ  300 mcg Subcutaneous q1800   Continuous Infusions: . sodium chloride 75 mL/hr at 01/25/18 1043  . sodium chloride    . sodium chloride    . diltiazem (CARDIZEM) infusion 5 mg/hr (01/25/18 0700)  . famotidine (PEPCID) IV    . famotidine (PEPCID) IV 20 mg (01/24/18 1808)  . potassium chloride 10 mEq (01/25/18 1443)     LOS: 9 days   Marylu Lund, MD [facility] Pager On Amion  If 7PM-7AM, please contact night-coverage 01/25/2018, 3:14 PM

## 2018-01-25 NOTE — Progress Notes (Signed)
Patient seen and examined.  Flatus but no BM. Feels nauseated and has required antiemetic. Minimal po intake.  Canister / NGT appears to have 300 out but I cannot tell at what time she was clamped.   She tells me since yesterday.  Bowel sounds are hypoactive.  I have asked nurse to reconnect NGT tonight and place new canister so we can assess output for this evening.  Please do not clamp for now.  I will come see he r tomorrow to reassess.  Precious Haws, MD Gyn Onc

## 2018-01-25 NOTE — Progress Notes (Signed)
[name]   DOB:1945-06-17   PN#:[id number]    Assessment & Plan:   Locally advanced cancer with carcinomatosis, suspicious for ovarian cancer She tolerated cycle 1 day 1 of carboplatin and Taxol on January 18, 2018. Continue supportive care  Nausea, vomiting, bloating and constipation Her symptoms are most consistent with carcinomatosis/partial small bowel obstruction Repeat x-ray of the abdomen on January 23, 2018 showed persistent signs of bowel obstruction.  NG tube was placed.  She has good symptomatic relief The NG was unhooked from suction yesterday.  She felt less symptomatic Recommend trial of clear liquid diet today before consideration to remove NG tube in the near future  Malignant ascites status post therapeutic paracentesis on January 15, 2018 She underwent repeat paracentesis on January 23, 2018. She felt better today  Severe protein calorie malnutrition We will start her on clear liquids with plan to advance her diet slowly as tolerated over the next few days  Mild leukopenia Due to recent chemotherapy She is at high risk for infection She was started on daily G-CSF support on 01/24/2018, until [facility] is greater than 1.5  Paroxysmal atrial fibrillation She is on telemetry unit along with beta-blocker I will defer to primary service There is no contraindication for her to be on anticoagulation therapy if indicated  Discharge planning Not ready for discharge due to unresolved bowel obstructive symptoms Will follow.    Haley Lark, MD 01/25/2018  8:20 AM   Subjective:  She felt better today.  She felt less nauseated and with mildly reduced pain.  She had spontaneous bowel movement yesterday.  She denies chest pain or shortness of breath.  No fever or chills.  Objective:  Vitals:   01/24/18 2231 01/25/18 0549  BP: 136/78 128/82  Pulse: 88 87  Resp: 16 16  Temp: 98.4 F (36.9 C) 97.8 F (36.6 C)  SpO2: 93% 95%     Intake/Output Summary (Last 24 hours)  at 01/25/2018 0820 Last data filed at 01/24/2018 1411 Gross per 24 hour  Intake 681.75 ml  Output 150 ml  Net 531.75 ml    GENERAL:alert, no distress and comfortable.  NG in situ SKIN: skin color, texture, turgor are normal, no rashes or significant lesions EYES: normal, Conjunctiva are pink and non-injected, sclera clear OROPHARYNX:no exudate, no erythema and lips, buccal mucosa, and tongue normal  NECK: supple, thyroid normal size, non-tender, without nodularity LYMPH:  no palpable lymphadenopathy in the cervical, axillary or inguinal LUNGS: clear to auscultation and percussion with normal breathing effort HEART: Noted occasional irregular rhythm.  No murmurs ABDOMEN:abdomen soft, distended with minimum bowel sounds Musculoskeletal:no cyanosis of digits and no clubbing  NEURO: alert & oriented x 3 with fluent speech, no focal motor/sensory deficits   Labs:  Lab Results  Component Value Date   WBC 2.7 (L) 01/25/2018   HGB 9.7 (L) 01/25/2018   HCT 30.7 (L) 01/25/2018   MCV 89.2 01/25/2018   PLT 237 01/25/2018   NEUTROABS 1.4 (L) 01/25/2018    Lab Results  Component Value Date   NA 131 (L) 01/25/2018   K 3.4 (L) 01/25/2018   CL 98 01/25/2018   CO2 25 01/25/2018    Studies:  Dg Abd 1 View  Result Date: 01/23/2018 CLINICAL DATA:  Nausea, vomiting, nasogastric tube placement EXAM: ABDOMEN - 1 VIEW COMPARISON:  Portable exam 1509 hours compared to 01/23/2018 at 1110 hours FINDINGS: Nasogastric tube tip projects over distal gastric antrum. Dilated gas-filled large and small bowel loops in the upper  abdomen. LEFT basilar atelectasis and tiny LEFT pleural effusion. Atherosclerotic calcification aorta. Port-A-Cath tip projects over SVC. No acute osseous findings. IMPRESSION: Persistent gaseous distention of bowel loops in the upper abdomen. Tip of nasogastric tube projects over distal gastric antrum. LEFT basilar atelectasis and tiny LEFT pleural effusion. Electronically Signed   By:  Lavonia Dana M.D.   On: 01/23/2018 16:01   US Paracentesis  Result Date: 01/23/2018 INDICATION: Patient with history of metastatic gyn/?ovarian carcinoma with recurrent malignant ascites. Request made for therapeutic paracentesis. EXAM: ULTRASOUND GUIDED THERAPEUTIC PARACENTESIS MEDICATIONS: None COMPLICATIONS: None immediate. PROCEDURE: Informed written consent was obtained from the patient after a discussion of the risks, benefits and alternatives to treatment. A timeout was performed prior to the initiation of the procedure. Initial ultrasound scanning demonstrates a small amount of ascites within the right mid to lower abdominal quadrant. The right mid to lower abdomen was prepped and draped in the usual sterile fashion. 1% lidocaine was used for local anesthesia. Following this, a 6 Fr Safe-T-Centesis catheter was introduced. An ultrasound image was saved for documentation purposes. The paracentesis was performed. The catheter was removed and a dressing was applied. The patient tolerated the procedure well without immediate post procedural complication. FINDINGS: A total of approximately 450 cc of hazy, yellow fluid was removed. IMPRESSION: Successful ultrasound-guided therapeutic paracentesis yielding 450 cc of peritoneal fluid. Read by: Rowe Robert, PA-C Electronically Signed   By: Aletta Edouard M.D.   On: 01/23/2018 12:19   Dg Abd 2 Views  Result Date: 01/23/2018 CLINICAL DATA:  Follow-up small bowel obstruction EXAM: ABDOMEN - 2 VIEW COMPARISON:  CT abdomen 01/15/2017, KUB 01/17/2017 FINDINGS: There is gaseous distention of small bowel and colon. There are multiple small bowel air-fluid levels. There are a few colonic air-fluid levels in the ascending colon. There is no evidence of pneumoperitoneum, portal venous gas or pneumatosis. There are no pathologic calcifications along the expected course of the ureters. The osseous structures are unremarkable. IMPRESSION: 1. Gaseous distention of small  bowel and colon with a few small bowel and colonic air-fluid levels. This appearance can be seen with an ileus versus partial small bowel obstruction. Electronically Signed   By: Kathreen Devoid   On: 01/23/2018 11:26

## 2018-01-26 LAB — BASIC METABOLIC PANEL
Anion gap: 10 (ref 5–15)
BUN: 8 mg/dL (ref 8–23)
CALCIUM: 7.9 mg/dL — AB (ref 8.9–10.3)
CO2: 24 mmol/L (ref 22–32)
Chloride: 98 mmol/L (ref 98–111)
Creatinine, Ser: 0.47 mg/dL (ref 0.44–1.00)
GFR calc Af Amer: >60 mL/min (ref >60)
Glucose, Bld: 77 mg/dL (ref 70–99)
Potassium: 3.3 mmol/L — ABNORMAL LOW (ref 3.5–5.1)
Sodium: 132 mmol/L — ABNORMAL LOW (ref 135–145)

## 2018-01-26 LAB — CBC WITH DIFFERENTIAL/PLATELET
ABS IMMATURE GRANULOCYTES: 0.05 10*3/uL (ref 0.00–0.07)
BASOS ABS: 0 10*3/uL (ref 0.0–0.1)
Basophils Relative: 1 %
EOS PCT: 6 %
Eosinophils Absolute: 0.2 10*3/uL (ref 0.0–0.5)
HCT: 30.7 % — ABNORMAL LOW (ref 36.0–46.0)
Hemoglobin: 9.6 g/dL — ABNORMAL LOW (ref 12.0–15.0)
Immature Granulocytes: 2 %
LYMPHS PCT: 31 %
Lymphs Abs: 0.9 10*3/uL (ref 0.7–4.0)
MCH: 28.2 pg (ref 26.0–34.0)
MCHC: 31.3 g/dL (ref 30.0–36.0)
MCV: 90.3 fL (ref 80.0–100.0)
Monocytes Absolute: 0.7 10*3/uL (ref 0.1–1.0)
Monocytes Relative: 22 %
Neutro Abs: 1.2 10*3/uL — ABNORMAL LOW (ref 1.7–7.7)
Neutrophils Relative %: 38 %
Platelets: 231 10*3/uL (ref 150–400)
RBC: 3.4 MIL/uL — ABNORMAL LOW (ref 3.87–5.11)
RDW: 13.2 % (ref 11.5–15.5)
WBC: 3 10*3/uL — ABNORMAL LOW (ref 4.0–10.5)
nRBC: 0 % (ref 0.0–0.2)

## 2018-01-26 MED ORDER — PNEUMOCOCCAL VAC POLYVALENT 25 MCG/0.5ML IJ INJ
0.5000 mL | INJECTION | INTRAMUSCULAR | Status: DC
  Filled 2018-01-26: qty 0.5

## 2018-01-26 MED ORDER — INFLUENZA VAC SPLIT HIGH-DOSE 0.5 ML IM SUSY
0.5000 mL | PREFILLED_SYRINGE | INTRAMUSCULAR | Status: DC
  Filled 2018-01-26: qty 0.5

## 2018-01-26 MED ORDER — KETOROLAC TROMETHAMINE 15 MG/ML IJ SOLN
15.0000 mg | Freq: Four times a day (QID) | INTRAMUSCULAR | Status: AC | PRN
  Administered 2018-01-26 – 2018-01-31 (×5): 15 mg via INTRAVENOUS
  Filled 2018-01-26 (×5): qty 1

## 2018-01-26 MED ORDER — ENOXAPARIN SODIUM 60 MG/0.6ML ~~LOC~~ SOLN
60.0000 mg | Freq: Two times a day (BID) | SUBCUTANEOUS | Status: DC
  Administered 2018-01-26 – 2018-01-27 (×3): 60 mg via SUBCUTANEOUS
  Filled 2018-01-26 (×3): qty 0.6

## 2018-01-26 MED ORDER — POTASSIUM CHLORIDE 10 MEQ/100ML IV SOLN
10.0000 meq | INTRAVENOUS | Status: AC
  Administered 2018-01-26 (×4): 10 meq via INTRAVENOUS
  Filled 2018-01-26 (×4): qty 100

## 2018-01-26 NOTE — Progress Notes (Signed)
PROGRESS NOTE    Haley [name]  EGB:[id number] DOB: 04/11/1945 DOA: 01/15/2018 PCP: [place], Lynnox G, MD    Brief Narrative:  73 year old female who presented with abdominal pain, she does have significant past medical history for ovarian cancer,complicated with peritoneal carcinomatosis and malignant ascites. Patient developed abdominal pain, that was refractory to analgesics and paracentesis. She was afebrile, blood pressure 112/62, heart rate 65, respiratory rate 18, oxygen saturation 95%, she had dry mucous membranes, lungs clear to auscultation bilaterally, heart S1-S2 present and rhythmic,her abdomen was mildly distended, tender to palpation no masses palpable, no lower extremity edema. Her abdomen and pelvis CTshowed dilated fluid filled loops of small bowel with transition point likely in the right adnexa adjacent to, cystic mass.  Patient was admitted to the hospital with working diagnosis of small bowel obstruction.  Assessment & Plan:   Principal Problem:   Partial small bowel obstruction (HCC) Active Problems:   Hypothyroidism   GERD   PAF (paroxysmal atrial fibrillation) (HCC)   Carcinomatosis (HCC)   Abdominal pain   Ovarian cancer ([place]) suspected  1. Small bowel obstruction due to ovarian cancer with carcinomatosis. Noted to have worsening symptoms, distention, pain, nausea and vomiting. NG tube placed and connected to low intermittent suction. Continued needed analgesics IV and IV antiemetics. Trials of clears 1/16 attempted, however pt reported increased nausea and distension. Abd xray reviewed with findings of mildly improved partial SBO with mildly increased colonic ileus or partial obstruction. GYN onc is following. Continue to encourage ambulation as tolerated  2. HTN.Hold on oral antihypertensive medications for now. Cont on IV beta blocker and IV cardizem per below  3. Dyslipidemia.Hold on oral medications for now. Stable currently  4. Hypothyroid.Will  continue with levothyroxine IV. Remains stable at this time  5. Paroxysmal atrial fibrillation.Noted to be in RVR despite scheduled IV beta blocker and added PRN labetalol IV. HR now much improved with addition of cardizem gtt. Awaiting ability for pt to tolerate PO reliably before transitioning to PO cardizem  6. Hypophosphatemiaand hypokalemia.Continue to replace potassium, will follow on renal panel in am. K today 3.3. Will replace  7. New back pain. Continue as needed IV analgesics.    DVT prophylaxis: SCD's Code Status: Full Family Communication: Pt in room, family at bedside Disposition Plan: Uncertain at this time  Consultants:   Oncology  Gyn Onc  Procedures:     Antimicrobials: Anti-infectives (From admission, onward)   Start     Dose/Rate Route Frequency Ordered Stop   01/17/18 1200  ceFAZolin (ANCEF) IVPB 2g/100 mL premix     2 g 200 mL/hr over 30 Minutes Intravenous To Radiology 01/17/18 1156 01/17/18 1330   01/16/18 0100  cefTRIAXone (ROCEPHIN) 2 g in sodium chloride 0.9 % 100 mL IVPB  Status:  Discontinued     2 g 200 mL/hr over 30 Minutes Intravenous Every 24 hours 01/16/18 0032 01/18/18 1503   01/15/18 1630  ceFEPIme (MAXIPIME) 2 g in sodium chloride 0.9 % 100 mL IVPB     2 g 200 mL/hr over 30 Minutes Intravenous  Once 01/15/18 1627 01/15/18 1828      Subjective: Still with abd distension  Objective: Vitals:   01/26/18 1238 01/26/18 1240 01/26/18 1246 01/26/18 1347  BP: (!) 143/80 123/82 126/84 121/69  Pulse: 94 81 81 96  Resp:    20  Temp:    98 F (36.7 C)  TempSrc:    Axillary  SpO2:    92%  Weight:  Height:        Intake/Output Summary (Last 24 hours) at 01/26/2018 1456 Last data filed at 01/26/2018 0600 Gross per 24 hour  Intake 1263.61 ml  Output 600 ml  Net 663.61 ml   Filed Weights   01/24/18 2000  Weight: 65.8 kg    Examination: General exam: Awake, laying in bed, in nad Respiratory system: Normal respiratory  effort, no wheezing Cardiovascular system: regular rate, s1, s2 Gastrointestinal system: Distended, decreased BS Central nervous system: CN2-12 grossly intact, strength intact Extremities: Perfused, no clubbing Skin: Normal skin turgor, no notable skin lesions seen Psychiatry: Mood normal // no visual hallucinations   Data Reviewed: I have personally reviewed following labs and imaging studies  CBC: Recent Labs  Lab 01/24/18 0630 01/25/18 0503 01/26/18 0302  WBC 2.2* 2.7* 3.0*  NEUTROABS 1.2* 1.4* 1.2*  HGB 10.2* 9.7* 9.6*  HCT 32.6* 30.7* 30.7*  MCV 91.1 89.2 90.3  PLT 270 237 458   Basic Metabolic Panel: Recent Labs  Lab 01/20/18 0400 01/22/18 0630 01/24/18 0630 01/25/18 0503 01/26/18 0302  NA  --  138 135 131* 132*  K  --  3.3* 3.4* 3.4* 3.3*  CL  --  104 101 98 98  CO2  --  26 25 25 24   GLUCOSE  --  103* 99 80 77  BUN  --  6* 13 11 8   CREATININE  --  0.57 0.54 0.47 0.47  CALCIUM  --  8.1* 7.7* 7.8* 7.9*  MG 2.1 2.2 2.1  --   --   PHOS 1.3* 2.8  --   --   --    GFR: Estimated Creatinine Clearance: 55.8 mL/min (by C-G formula based on SCr of 0.47 mg/dL). Liver Function Tests: No results for input(s): AST, ALT, ALKPHOS, BILITOT, PROT, ALBUMIN in the last 168 hours. No results for input(s): LIPASE, AMYLASE in the last 168 hours. No results for input(s): AMMONIA in the last 168 hours. Coagulation Profile: No results for input(s): INR, PROTIME in the last 168 hours. Cardiac Enzymes: No results for input(s): CKTOTAL, CKMB, CKMBINDEX, TROPONINI in the last 168 hours. BNP (last 3 results) No results for input(s): PROBNP in the last 8760 hours. HbA1C: No results for input(s): HGBA1C in the last 72 hours. CBG: No results for input(s): GLUCAP in the last 168 hours. Lipid Profile: No results for input(s): CHOL, HDL, LDLCALC, TRIG, CHOLHDL, LDLDIRECT in the last 72 hours. Thyroid Function Tests: No results for input(s): TSH, T4TOTAL, FREET4, T3FREE, THYROIDAB in the  last 72 hours. Anemia Panel: No results for input(s): VITAMINB12, FOLATE, FERRITIN, TIBC, IRON, RETICCTPCT in the last 72 hours. Sepsis Labs: No results for input(s): PROCALCITON, LATICACIDVEN in the last 168 hours.  No results found for this or any previous visit (from the past 240 hour(s)).   Radiology Studies: Dg Abd Portable 1v  Result Date: 01/25/2018 CLINICAL DATA:  Followup small bowel obstruction. EXAM: PORTABLE ABDOMEN - 1 VIEW COMPARISON:  01/23/2018.  Abdomen and pelvis CT dated 01/15/2018. FINDINGS: Nasogastric tube tip and side hole in the stomach. Mildly decreased gas distended small bowel loops and mildly increased gas distended loops colon. Stool and gas in normal caliber rectum. Unremarkable bones. IMPRESSION: Mildly improved pattern of partial small bowel obstruction with mildly increased colonic ileus or partial obstruction. Electronically Signed   By: Claudie Revering M.D.   On: 01/25/2018 19:55    Scheduled Meds: . docusate sodium  100 mg Oral BID  . enoxaparin (LOVENOX) injection  60 mg Subcutaneous  Q12H  . fluticasone  2 spray Each Nare Daily  . hydrocortisone   Rectal BID  . [START ON 01/27/2018] Influenza vac split quadrivalent PF  0.5 mL Intramuscular Tomorrow-1000  . levothyroxine  37.5 mcg Intravenous Daily  . metoprolol tartrate  10 mg Intravenous Q6H  . [START ON 01/27/2018] pneumococcal 23 valent vaccine  0.5 mL Intramuscular Tomorrow-1000  . polyethylene glycol  17 g Oral Daily  . senna  1 tablet Oral BID  . Tbo-filgastrim (GRANIX) SQ  300 mcg Subcutaneous q1800   Continuous Infusions: . sodium chloride 75 mL/hr at 01/26/18 0600  . sodium chloride    . sodium chloride    . diltiazem (CARDIZEM) infusion 6 mg/hr (01/26/18 1147)  . famotidine (PEPCID) IV    . famotidine (PEPCID) IV 20 mg (01/25/18 1738)     LOS: 10 days   Marylu Lund, MD [facility] Pager On Amion  If 7PM-7AM, please contact night-coverage 01/26/2018, 2:56 PM

## 2018-01-26 NOTE — Progress Notes (Signed)
[place]   DOB:1945/08/21   OE#:[id number]    Assessment & Plan:   Locally advanced cancer with carcinomatosis, suspicious for ovarian cancer She tolerated cycle 1 day 1 of carboplatin and Taxol on January 18, 2018. Continue supportive care  Nausea, vomiting, bloating and constipation Her symptoms are most consistent with carcinomatosis/partial small bowel obstruction Repeat x-ray of the abdomen on January 23, 2018 showed persistent signs of bowel obstruction. NG tube was placed. She has good symptomatic relief.  Repeat abdominal x-ray on January 25, 2018 show stability Recommend trial of clear liquid diet again when she is ready before consideration to remove NG tube in the near future  Malignant ascites status post therapeutic paracentesis on January 15, 2018 She underwent repeat paracentesis on January 23, 2018. She felt better today  Severe protein calorie malnutrition We will start her on clear liquids with plan to advance her diet slowly as tolerated over the next few days  Mild leukopenia Due to recent chemotherapy She is at high risk for infection She was started on daily G-CSF support on 01/24/2018, until [facility] is greater than 1.5  Paroxysmal atrial fibrillation She is on telemetry unit along with beta-blocker I will defer to primary service There is no contraindication for her to be on anticoagulation therapy if indicated, as long as platelet count is >50,000  Discharge planning Not ready for discharge due to unresolved bowel obstructive symptoms Please call consult service over the weekend if questions arise.  I will check on her again next week Heath Lark, MD 01/26/2018  9:10 AM   Subjective:  She did not tolerate clear liquid diet yesterday but her nausea has improved this morning.  She denies significant pain.  No fever or chills.  Denies chest pain or shortness of breath  Objective:  Vitals:   01/25/18 1959 01/26/18 0655  BP: 128/86 112/69  Pulse: 97  67  Resp: 16 16  Temp: 98.2 F (36.8 C) 99.5 F (37.5 C)  SpO2: 93% 93%     Intake/Output Summary (Last 24 hours) at 01/26/2018 0910 Last data filed at 01/26/2018 0600 Gross per 24 hour  Intake 1623.61 ml  Output 600 ml  Net 1023.61 ml    GENERAL:alert, no distress and comfortable SKIN: skin color, texture, turgor are normal, no rashes or significant lesions EYES: normal, Conjunctiva are pink and non-injected, sclera clear OROPHARYNX:no exudate, no erythema and lips, buccal mucosa, and tongue normal  NECK: supple, thyroid normal size, non-tender, without nodularity LYMPH:  no palpable lymphadenopathy in the cervical, axillary or inguinal LUNGS: clear to auscultation and percussion with normal breathing effort HEART: Irregular rate and rhythm, no murmurs and no lower extremity edema ABDOMEN:abdomen soft, distended with reduced bowel sounds Musculoskeletal:no cyanosis of digits and no clubbing  NEURO: alert & oriented x 3 with fluent speech, no focal motor/sensory deficits   Labs:  Lab Results  Component Value Date   WBC 3.0 (L) 01/26/2018   HGB 9.6 (L) 01/26/2018   HCT 30.7 (L) 01/26/2018   MCV 90.3 01/26/2018   PLT 231 01/26/2018   NEUTROABS 1.2 (L) 01/26/2018    Lab Results  Component Value Date   NA 132 (L) 01/26/2018   K 3.3 (L) 01/26/2018   CL 98 01/26/2018   CO2 24 01/26/2018    Studies:  Dg Abd Portable 1v  Result Date: 01/25/2018 CLINICAL DATA:  Followup small bowel obstruction. EXAM: PORTABLE ABDOMEN - 1 VIEW COMPARISON:  01/23/2018.  Abdomen and pelvis CT dated 01/15/2018. FINDINGS: Nasogastric  tube tip and side hole in the stomach. Mildly decreased gas distended small bowel loops and mildly increased gas distended loops colon. Stool and gas in normal caliber rectum. Unremarkable bones. IMPRESSION: Mildly improved pattern of partial small bowel obstruction with mildly increased colonic ileus or partial obstruction. Electronically Signed   By: Claudie Revering M.D.    On: 01/25/2018 19:55

## 2018-01-26 NOTE — Progress Notes (Signed)
S: Patient with less nausea than yesterday. No emesis. No BM/Flatus overnight. O: VSS Abdo +NABS in 3/4 quadrants. Still with some distant bowel sounds/tinkling in RLQ. Soft, mild distended but NT and no rebound  NG has put out 100cc over 12 hours  A/P Partial obstruction - consider dc NGT to decrease aspiration risk, however if helping with nausea/clinical symptoms defer to primary team. Diet advance as tolerated to clear or full liquids, but would not advance to regular until nausea/bowel function normalized.  Ovarian CA - s/p Cycle 1 with Dr. Elson Areas. Continue plan of care with goal for cycle 2  Afib - per primary team. I am not opposed to anticoagulation  I will return next week to followup. If any surgical concerns over the weekend Dr. Lahoma Crocker is covering and can be contacted.  Precious Haws, MD Gyn Onc

## 2018-01-26 NOTE — Progress Notes (Signed)
Assumed care of pt at 1500. Agree with previous assessment by RN. No complaints or concerns by pt at this time. Will continue to monitor. 

## 2018-01-26 NOTE — Progress Notes (Signed)
ANTICOAGULATION CONSULT NOTE - Initial Consult  Pharmacy Consult for Lovenox Indication: atrial fibrillation  Allergies  Allergen Reactions  . Tramadol Hcl     REACTION: jittery  . Tramadol Nausea And Vomiting    Patient Measurements: Height: 5\' 2"  (157.5 cm) Weight: 145 lb 1 oz (65.8 kg) IBW/kg (Calculated) : 50.1  Vital Signs: Temp: 99.5 F (37.5 C) (01/17 0655) Temp Source: Oral (01/17 0655) BP: 112/69 (01/17 0655) Pulse Rate: 67 (01/17 0655)  Labs: Recent Labs    01/24/18 0630 01/25/18 0503 01/26/18 0302  HGB 10.2* 9.7* 9.6*  HCT 32.6* 30.7* 30.7*  PLT 270 237 231  CREATININE 0.54 0.47 0.47    Estimated Creatinine Clearance: 55.8 mL/min (by C-G formula based on SCr of 0.47 mg/dL).   Medical History: Past Medical History:  Diagnosis Date  . Allergy    SEASONAL  . Cataract    BILATERAL  . Fibromyalgia   . GERD (gastroesophageal reflux disease)   . H/O echocardiogram 04/28/08   EF>55% trace mitral regurgitation, No significant valvular pathology  . Hemorrhoids    internal  . History of stress test 02/08/2011   Normal Myocardial perfusion study, this is a low risk scan, No prior study available for comparison  . Hyperlipidemia   . MVP (mitral valve prolapse)    mild and MR  . OA (osteoarthritis)   . Osteoporosis   . Paroxysmal A-fib ([place])   . Sleep apnea    does not use prescribed CPAP  . Thyroid disease    HYPO  . Varicosities of leg   . Vitamin D deficiency     Medications:  asa 81mg  daily PTA  Assessment: 73 yo F admitted with abdominal pain.  PMH significant for ovarian cancer s/p chemotherapy ~1 week ago.  She also developed new onset Afib this admission.   Pharmacy asked to start Lovenox.    CBC- Hg low but not acute bleeding noted, stable from yesterday.  Pltc WNL.   Scr at patient's baseline.  CrCl>55ml/min.   Goal of Therapy:  Anti-Xa level 0.6-1 units/ml 4hrs after LMWH dose given Monitor platelets by anticoagulation protocol:  Yes   Plan:  Lovenox 60mg  SQ q12h CBC q3days Monitor renal function & s/sx of bleeding F/U long-term anticoagulation plans once patient tolerating oral meds.   Biagio Borg 01/26/2018,8:37 AM

## 2018-01-27 DIAGNOSIS — K668 Other specified disorders of peritoneum: Secondary | ICD-10-CM

## 2018-01-27 LAB — CBC WITH DIFFERENTIAL/PLATELET
Abs Immature Granulocytes: 0.27 10*3/uL — ABNORMAL HIGH (ref 0.00–0.07)
Basophils Absolute: 0.1 10*3/uL (ref 0.0–0.1)
Basophils Relative: 1 %
EOS ABS: 0.2 10*3/uL (ref 0.0–0.5)
Eosinophils Relative: 2 %
HCT: 31.8 % — ABNORMAL LOW (ref 36.0–46.0)
Hemoglobin: 10 g/dL — ABNORMAL LOW (ref 12.0–15.0)
Immature Granulocytes: 4 %
Lymphocytes Relative: 25 %
Lymphs Abs: 1.8 10*3/uL (ref 0.7–4.0)
MCH: 27.9 pg (ref 26.0–34.0)
MCHC: 31.4 g/dL (ref 30.0–36.0)
MCV: 88.6 fL (ref 80.0–100.0)
Monocytes Absolute: 1.6 10*3/uL — ABNORMAL HIGH (ref 0.1–1.0)
Monocytes Relative: 22 %
Neutro Abs: 3.4 10*3/uL (ref 1.7–7.7)
Neutrophils Relative %: 46 %
Platelets: 225 10*3/uL (ref 150–400)
RBC: 3.59 MIL/uL — AB (ref 3.87–5.11)
RDW: 13.3 % (ref 11.5–15.5)
WBC: 7.3 10*3/uL (ref 4.0–10.5)
nRBC: 0.4 % — ABNORMAL HIGH (ref 0.0–0.2)

## 2018-01-27 LAB — COMPREHENSIVE METABOLIC PANEL
ALT: 14 U/L (ref 0–44)
AST: 15 U/L (ref 15–41)
Albumin: 2.3 g/dL — ABNORMAL LOW (ref 3.5–5.0)
Alkaline Phosphatase: 79 U/L (ref 38–126)
Anion gap: 8 (ref 5–15)
BUN: 7 mg/dL — ABNORMAL LOW (ref 8–23)
CO2: 25 mmol/L (ref 22–32)
Calcium: 8 mg/dL — ABNORMAL LOW (ref 8.9–10.3)
Chloride: 98 mmol/L (ref 98–111)
Creatinine, Ser: 0.54 mg/dL (ref 0.44–1.00)
GFR calc non Af Amer: >60 mL/min (ref >60)
Glucose, Bld: 92 mg/dL (ref 70–99)
Potassium: 3 mmol/L — ABNORMAL LOW (ref 3.5–5.1)
Sodium: 131 mmol/L — ABNORMAL LOW (ref 135–145)
Total Bilirubin: 0.5 mg/dL (ref 0.3–1.2)
Total Protein: 5.4 g/dL — ABNORMAL LOW (ref 6.5–8.1)

## 2018-01-27 MED ORDER — FAMOTIDINE 20 MG PO TABS
40.0000 mg | ORAL_TABLET | Freq: Every day | ORAL | Status: DC
  Administered 2018-01-27 – 2018-02-02 (×7): 40 mg via ORAL
  Filled 2018-01-27 (×7): qty 2

## 2018-01-27 MED ORDER — LEVOTHYROXINE SODIUM 50 MCG PO TABS
75.0000 ug | ORAL_TABLET | Freq: Every day | ORAL | Status: DC
  Administered 2018-01-28 – 2018-02-02 (×6): 75 ug via ORAL
  Filled 2018-01-27 (×6): qty 1

## 2018-01-27 MED ORDER — POTASSIUM CHLORIDE 10 MEQ/100ML IV SOLN
10.0000 meq | INTRAVENOUS | Status: AC
  Administered 2018-01-27 (×6): 10 meq via INTRAVENOUS
  Filled 2018-01-27 (×3): qty 100

## 2018-01-27 MED ORDER — RIVAROXABAN 20 MG PO TABS
20.0000 mg | ORAL_TABLET | Freq: Every day | ORAL | Status: DC
  Administered 2018-01-27 – 2018-02-01 (×6): 20 mg via ORAL
  Filled 2018-01-27 (×6): qty 1

## 2018-01-27 MED ORDER — METOPROLOL SUCCINATE ER 25 MG PO TB24
25.0000 mg | ORAL_TABLET | Freq: Every day | ORAL | Status: DC
  Administered 2018-01-27 – 2018-01-28 (×2): 25 mg via ORAL
  Filled 2018-01-27 (×2): qty 1

## 2018-01-27 MED ORDER — DILTIAZEM HCL 60 MG PO TABS
60.0000 mg | ORAL_TABLET | Freq: Four times a day (QID) | ORAL | Status: DC
  Administered 2018-01-27 – 2018-01-30 (×11): 60 mg via ORAL
  Filled 2018-01-27 (×11): qty 1

## 2018-01-27 MED ORDER — DILTIAZEM HCL 60 MG PO TABS: 60.0000 mg | ORAL_TABLET | Freq: Three times a day (TID) | ORAL | Status: DC

## 2018-01-27 NOTE — Progress Notes (Signed)
Patient reports having a large bowel movement last night and a bowel movement this morning.  NG tube in place. Patient drinking clear liquids this morning. Will continue to monitor.

## 2018-01-27 NOTE — Progress Notes (Addendum)
[place] for Lovenox --> Xarelto  Indication: atrial fibrillation  Allergies  Allergen Reactions  . Tramadol Hcl     REACTION: jittery  . Tramadol Nausea And Vomiting    Patient Measurements: Height: 5\' 2"  (157.5 cm) Weight: 145 lb 1 oz (65.8 kg) IBW/kg (Calculated) : 50.1  Vital Signs: Temp: 98.3 F (36.8 C) (01/18 1327) Temp Source: Oral (01/18 1327) BP: 126/83 (01/18 1327) Pulse Rate: 96 (01/18 1327)  Labs: Recent Labs    01/25/18 0503 01/26/18 0302 01/27/18 0400  HGB 9.7* 9.6* 10.0*  HCT 30.7* 30.7* 31.8*  PLT 237 231 225  CREATININE 0.47 0.47 0.54    Estimated Creatinine Clearance: 55.8 mL/min (by C-G formula based on SCr of 0.54 mg/dL).   Medical History: Past Medical History:  Diagnosis Date  . Allergy    SEASONAL  . Cataract    BILATERAL  . Fibromyalgia   . GERD (gastroesophageal reflux disease)   . H/O echocardiogram 04/28/08   EF>55% trace mitral regurgitation, No significant valvular pathology  . Hemorrhoids    internal  . History of stress test 02/08/2011   Normal Myocardial perfusion study, this is a low risk scan, No prior study available for comparison  . Hyperlipidemia   . MVP (mitral valve prolapse)    mild and MR  . OA (osteoarthritis)   . Osteoporosis   . Paroxysmal A-fib ([place])   . Sleep apnea    does not use prescribed CPAP  . Thyroid disease    HYPO  . Varicosities of leg   . Vitamin D deficiency     Medications:  Aspirin 81mg  daily PTA (not ordered on admission)  Assessment: 7 y/oF presented with abdominal pain, found to have SBO.  PMH significant for ovarian cancer s/p chemotherapy on 01/18/2018. She also developed new onset a-fib this admission. Pharmacy asked to start Lovenox on 01/26/18, now asked to transition patient to [place] as patient tolerating clears, advancing diet, and NG d/c'ed.   Today, 01/27/18:     SCr 0.54 with CrCl > 50 ml/min  CBC: Hgb low but stable at 10, Pltc  WNL  No bleeding issues noted  Last dose of lovenox 60mg  SQ given today at 1134.   Goal of Therapy:  Appropriate dose for indication Prevention of stroke  Monitor platelets by anticoagulation protocol: Yes   Plan:   D/C lovenox now.  Start Xarelto 20mg  PO daily with supper-first dose at 2200 today due to Lovenox dose being given late. Instructed RN to give dose with food.  Monitor CBC (currently ordered as daily per MD), renal function   Monitor closely for s/sx of bleeding (noted has order for PRN ketorolac)  Pharmacy to provide education prior to discharge.   Lindell Spar, PharmD, BCPS Pager: [phone] 01/27/2018 5:25 PM

## 2018-01-27 NOTE — Progress Notes (Signed)
PROGRESS NOTE    Haley [name]  AVW:[id number] DOB: [birth year] DOA: 01/15/2018 PCP: [place], Dany G, MD    Brief Narrative:  73 year old female who presented with abdominal pain, she does have significant past medical history for ovarian cancer,complicated with peritoneal carcinomatosis and malignant ascites. Patient developed abdominal pain, that was refractory to analgesics and paracentesis. She was afebrile, blood pressure 112/62, heart rate 65, respiratory rate 18, oxygen saturation 95%, she had dry mucous membranes, lungs clear to auscultation bilaterally, heart S1-S2 present and rhythmic,her abdomen was mildly distended, tender to palpation no masses palpable, no lower extremity edema. Her abdomen and pelvis CTshowed dilated fluid filled loops of small bowel with transition point likely in the right adnexa adjacent to, cystic mass.  Patient was admitted to the hospital with working diagnosis of small bowel obstruction.  Assessment & Plan:   Principal Problem:   Partial small bowel obstruction (HCC) Active Problems:   Hypothyroidism   GERD   PAF (paroxysmal atrial fibrillation) (HCC)   Carcinomatosis (HCC)   Abdominal pain   Ovarian cancer ([place]) suspected  1. Small bowel obstruction due to ovarian cancer with carcinomatosis. Noted to have worsening symptoms, distention, pain, nausea and vomiting. NG tube placed and connected to low intermittent suction.  -multiple bowel movements noted overnight with pos BS on exam -Tolerating PO clears this AM -Will advance to full liquid. D/C NG tube -Continue to encourage ambulation  2. HTN. -Will transition to PO bp beta blocker -Transition to PO cardizem  3. Dyslipidemia.Hold on oral medications for now. Presently stable  4. Hypothyroid.transition to home dose PO thyroid replacement  5. Paroxysmal atrial fibrillation.Recently noted to be in RVR despite PRN beta blocer. Improved with cardizem gtt. HR remains stable.  Transition to PO beta blocker and transition to PO cardizem  6. Hypophosphatemiaand hypokalemia.Continue to replace potassium. Replace BMET in AM, replace lytes as needed  7. New back pain. Continue as needed IV analgesics.    DVT prophylaxis: SCD's Code Status: Full Family Communication: Pt in room, family not at bedside Disposition Plan: Uncertain at this time  Consultants:   Oncology  Gyn Onc  Procedures:     Antimicrobials: Anti-infectives (From admission, onward)   Start     Dose/Rate Route Frequency Ordered Stop   01/17/18 1200  ceFAZolin (ANCEF) IVPB 2g/100 mL premix     2 g 200 mL/hr over 30 Minutes Intravenous To Radiology 01/17/18 1156 01/17/18 1330   01/16/18 0100  cefTRIAXone (ROCEPHIN) 2 g in sodium chloride 0.9 % 100 mL IVPB  Status:  Discontinued     2 g 200 mL/hr over 30 Minutes Intravenous Every 24 hours 01/16/18 0032 01/18/18 1503   01/15/18 1630  ceFEPIme (MAXIPIME) 2 g in sodium chloride 0.9 % 100 mL IVPB     2 g 200 mL/hr over 30 Minutes Intravenous  Once 01/15/18 1627 01/15/18 1828      Subjective: Multiple bowel movements. Feeling better and tolerating clears  Objective: Vitals:   01/26/18 2042 01/27/18 0619 01/27/18 1153 01/27/18 1327  BP: 123/70 101/69 118/70 126/83  Pulse: 78 70 96 96  Resp: 18 16 16 15   Temp: 98.8 F (37.1 C) 98.2 F (36.8 C)  98.3 F (36.8 C)  TempSrc: Oral Oral  Oral  SpO2: 93% 91% 96% 95%  Weight:      Height:        Intake/Output Summary (Last 24 hours) at 01/27/2018 1619 Last data filed at 01/27/2018 1100 Gross per 24 hour  Intake 360  ml  Output 3 ml  Net 357 ml   Filed Weights   01/24/18 2000  Weight: 65.8 kg    Examination: General exam: Conversant, in no acute distress Respiratory system: normal chest rise, clear, no audible wheezing Cardiovascular system: regular rhythm, s1-s2 Gastrointestinal system: Pos BS, mildly distended Central nervous system: No seizures, no tremors Extremities:  No cyanosis, no joint deformities Skin: No rashes, no pallor Psychiatry: Affect normal // no auditory hallucinations   Data Reviewed: I have personally reviewed following labs and imaging studies  CBC: Recent Labs  Lab 01/24/18 0630 01/25/18 0503 01/26/18 0302 01/27/18 0400  WBC 2.2* 2.7* 3.0* 7.3  NEUTROABS 1.2* 1.4* 1.2* 3.4  HGB 10.2* 9.7* 9.6* 10.0*  HCT 32.6* 30.7* 30.7* 31.8*  MCV 91.1 89.2 90.3 88.6  PLT 270 237 231 160   Basic Metabolic Panel: Recent Labs  Lab 01/22/18 0630 01/24/18 0630 01/25/18 0503 01/26/18 0302 01/27/18 0400  NA 138 135 131* 132* 131*  K 3.3* 3.4* 3.4* 3.3* 3.0*  CL 104 101 98 98 98  CO2 26 25 25 24 25   GLUCOSE 103* 99 80 77 92  BUN 6* 13 11 8  7*  CREATININE 0.57 0.54 0.47 0.47 0.54  CALCIUM 8.1* 7.7* 7.8* 7.9* 8.0*  MG 2.2 2.1  --   --   --   PHOS 2.8  --   --   --   --    GFR: Estimated Creatinine Clearance: 55.8 mL/min (by C-G formula based on SCr of 0.54 mg/dL). Liver Function Tests: Recent Labs  Lab 01/27/18 0400  AST 15  ALT 14  ALKPHOS 79  BILITOT 0.5  PROT 5.4*  ALBUMIN 2.3*   No results for input(s): LIPASE, AMYLASE in the last 168 hours. No results for input(s): AMMONIA in the last 168 hours. Coagulation Profile: No results for input(s): INR, PROTIME in the last 168 hours. Cardiac Enzymes: No results for input(s): CKTOTAL, CKMB, CKMBINDEX, TROPONINI in the last 168 hours. BNP (last 3 results) No results for input(s): PROBNP in the last 8760 hours. HbA1C: No results for input(s): HGBA1C in the last 72 hours. CBG: No results for input(s): GLUCAP in the last 168 hours. Lipid Profile: No results for input(s): CHOL, HDL, LDLCALC, TRIG, CHOLHDL, LDLDIRECT in the last 72 hours. Thyroid Function Tests: No results for input(s): TSH, T4TOTAL, FREET4, T3FREE, THYROIDAB in the last 72 hours. Anemia Panel: No results for input(s): VITAMINB12, FOLATE, FERRITIN, TIBC, IRON, RETICCTPCT in the last 72 hours. Sepsis Labs: No  results for input(s): PROCALCITON, LATICACIDVEN in the last 168 hours.  No results found for this or any previous visit (from the past 240 hour(s)).   Radiology Studies: Dg Abd Portable 1v  Result Date: 01/25/2018 CLINICAL DATA:  Followup small bowel obstruction. EXAM: PORTABLE ABDOMEN - 1 VIEW COMPARISON:  01/23/2018.  Abdomen and pelvis CT dated 01/15/2018. FINDINGS: Nasogastric tube tip and side hole in the stomach. Mildly decreased gas distended small bowel loops and mildly increased gas distended loops colon. Stool and gas in normal caliber rectum. Unremarkable bones. IMPRESSION: Mildly improved pattern of partial small bowel obstruction with mildly increased colonic ileus or partial obstruction. Electronically Signed   By: Claudie Revering M.D.   On: 01/25/2018 19:55    Scheduled Meds: . diltiazem  60 mg Oral Q8H  . docusate sodium  100 mg Oral BID  . enoxaparin (LOVENOX) injection  60 mg Subcutaneous Q12H  . famotidine  40 mg Oral Daily  . fluticasone  2 spray Each  Nare Daily  . hydrocortisone   Rectal BID  . Influenza vac split quadrivalent PF  0.5 mL Intramuscular Tomorrow-1000  . [START ON 01/28/2018] levothyroxine  75 mcg Oral Q0600  . metoprolol succinate  25 mg Oral Daily  . pneumococcal 23 valent vaccine  0.5 mL Intramuscular Tomorrow-1000  . polyethylene glycol  17 g Oral Daily  . senna  1 tablet Oral BID   Continuous Infusions: . sodium chloride 75 mL/hr at 01/26/18 0600  . sodium chloride    . sodium chloride    . diltiazem (CARDIZEM) infusion 6 mg/hr (01/27/18 0721)  . famotidine (PEPCID) IV       LOS: 11 days   Marylu Lund, MD [facility] Pager On Amion  If 7PM-7AM, please contact night-coverage 01/27/2018, 4:19 PM

## 2018-01-27 NOTE — Progress Notes (Signed)
Patient tolerating clear liquid diet.  Patient had another bowel movement this morning.  Will continue to monitor.

## 2018-01-27 NOTE — Progress Notes (Signed)
Late entry:  Patient ambulated around unit approximately four laps this afternoon.  Tolerated well.  Patient resting in bed.

## 2018-01-28 LAB — BASIC METABOLIC PANEL
Anion gap: 10 (ref 5–15)
BUN: <5 mg/dL — ABNORMAL LOW (ref 8–23)
CO2: 24 mmol/L (ref 22–32)
Calcium: 8 mg/dL — ABNORMAL LOW (ref 8.9–10.3)
Chloride: 101 mmol/L (ref 98–111)
Creatinine, Ser: 0.56 mg/dL (ref 0.44–1.00)
GFR calc Af Amer: >60 mL/min (ref >60)
GFR calc non Af Amer: >60 mL/min (ref >60)
Glucose, Bld: 73 mg/dL (ref 70–99)
Potassium: 3.2 mmol/L — ABNORMAL LOW (ref 3.5–5.1)
SODIUM: 135 mmol/L (ref 135–145)

## 2018-01-28 LAB — MAGNESIUM: Magnesium: 1.8 mg/dL (ref 1.7–2.4)

## 2018-01-28 LAB — CBC WITH DIFFERENTIAL/PLATELET
Abs Immature Granulocytes: 1.5 10*3/uL — ABNORMAL HIGH (ref 0.00–0.07)
Band Neutrophils: 12 %
Basophils Absolute: 0 10*3/uL (ref 0.0–0.1)
Basophils Relative: 0 %
Eosinophils Absolute: 0.2 10*3/uL (ref 0.0–0.5)
Eosinophils Relative: 1 %
HCT: 30.8 % — ABNORMAL LOW (ref 36.0–46.0)
HEMOGLOBIN: 9.7 g/dL — AB (ref 12.0–15.0)
Lymphocytes Relative: 11 %
Lymphs Abs: 2.1 10*3/uL (ref 0.7–4.0)
MCH: 28.4 pg (ref 26.0–34.0)
MCHC: 31.5 g/dL (ref 30.0–36.0)
MCV: 90.3 fL (ref 80.0–100.0)
METAMYELOCYTES PCT: 5 %
Monocytes Absolute: 1.2 10*3/uL — ABNORMAL HIGH (ref 0.1–1.0)
Monocytes Relative: 6 %
Myelocytes: 3 %
Neutro Abs: 14.3 10*3/uL — ABNORMAL HIGH (ref 1.7–7.7)
Neutrophils Relative %: 62 %
Platelets: 251 10*3/uL (ref 150–400)
RBC: 3.41 MIL/uL — ABNORMAL LOW (ref 3.87–5.11)
RDW: 13.7 % (ref 11.5–15.5)
WBC: 19.3 10*3/uL — ABNORMAL HIGH (ref 4.0–10.5)
nRBC: 0.4 % — ABNORMAL HIGH (ref 0.0–0.2)

## 2018-01-28 MED ORDER — METOPROLOL SUCCINATE ER 25 MG PO TB24
25.0000 mg | ORAL_TABLET | Freq: Every day | ORAL | Status: DC
  Administered 2018-01-29: 25 mg via ORAL
  Filled 2018-01-28: qty 1

## 2018-01-28 MED ORDER — ZOLPIDEM TARTRATE 5 MG PO TABS
5.0000 mg | ORAL_TABLET | Freq: Once | ORAL | Status: AC
  Administered 2018-01-28: 5 mg via ORAL
  Filled 2018-01-28: qty 1

## 2018-01-28 MED ORDER — TRAZODONE HCL 100 MG PO TABS
100.0000 mg | ORAL_TABLET | Freq: Every evening | ORAL | Status: DC | PRN
  Administered 2018-01-28 – 2018-02-01 (×4): 100 mg via ORAL
  Filled 2018-01-28 (×4): qty 1

## 2018-01-28 MED ORDER — METOPROLOL SUCCINATE ER 25 MG PO TB24: 25.0000 mg | ORAL_TABLET | Freq: Once | ORAL | Status: DC

## 2018-01-28 MED ORDER — METOPROLOL SUCCINATE ER 50 MG PO TB24: 50.0000 mg | ORAL_TABLET | Freq: Every day | ORAL | Status: DC

## 2018-01-28 MED ORDER — POTASSIUM CHLORIDE CRYS ER 20 MEQ PO TBCR
40.0000 meq | EXTENDED_RELEASE_TABLET | Freq: Once | ORAL | Status: AC
  Administered 2018-01-28: 40 meq via ORAL
  Filled 2018-01-28: qty 2

## 2018-01-28 NOTE — Progress Notes (Signed)
PROGRESS NOTE    Haley [name]  AOZ:[id number] DOB: August 18, 1945 DOA: 01/15/2018 PCP: [place], Sharla G, MD    Brief Narrative:  73 year old female who presented with abdominal pain, she does have significant past medical history for ovarian cancer,complicated with peritoneal carcinomatosis and malignant ascites. Patient developed abdominal pain, that was refractory to analgesics and paracentesis. She was afebrile, blood pressure 112/62, heart rate 65, respiratory rate 18, oxygen saturation 95%, she had dry mucous membranes, lungs clear to auscultation bilaterally, heart S1-S2 present and rhythmic,her abdomen was mildly distended, tender to palpation no masses palpable, no lower extremity edema. Her abdomen and pelvis CTshowed dilated fluid filled loops of small bowel with transition point likely in the right adnexa adjacent to, cystic mass.  Patient was admitted to the hospital with working diagnosis of small bowel obstruction.  Assessment & Plan:   Principal Problem:   Partial small bowel obstruction (HCC) Active Problems:   Hypothyroidism   GERD   PAF (paroxysmal atrial fibrillation) (HCC)   Carcinomatosis (HCC)   Abdominal pain   Ovarian cancer ([place]) suspected  1. Small bowel obstruction due to ovarian cancer with carcinomatosis. Noted to have worsening symptoms, distention, pain, nausea and vomiting. NG tube placed and connected to low intermittent suction.  -recently with multiple BM. Pos BS on exam - Advanced to Full Liquids - NG removed -Continue to encourage ambulation  2. HTN. -On PO bp beta blocker -Cont on PO cardizem  3. Dyslipidemia.Held on oral medications for now. Presently stable  4. Hypothyroid.now on PO thyroid replacement  5. Paroxysmal atrial fibrillation.Recently noted to be in RVR despite PRN beta blocer. Improved with cardizem gtt. HR remains stable. Now on PO beta blocker and PO cardizem. HR remains labile, currently tachycardic in the 130's  Will give IV labetalol PRN. Most recent 2d echo from 2010. Will request repeat echo. BP soft with current regimen. Consider digoxin if remains poorly controlled  6. Hypophosphatemiaand hypokalemia.Continue to replace potassium. Replace BMET in AM, cont to replace as needed  7. New back pain. Continue as needed IV analgesics.    DVT prophylaxis: SCD's Code Status: Full Family Communication: Pt in room, family not at bedside Disposition Plan: Uncertain at this time  Consultants:   Oncology  Gyn Onc  Procedures:     Antimicrobials: Anti-infectives (From admission, onward)   Start     Dose/Rate Route Frequency Ordered Stop   01/17/18 1200  ceFAZolin (ANCEF) IVPB 2g/100 mL premix     2 g 200 mL/hr over 30 Minutes Intravenous To Radiology 01/17/18 1156 01/17/18 1330   01/16/18 0100  cefTRIAXone (ROCEPHIN) 2 g in sodium chloride 0.9 % 100 mL IVPB  Status:  Discontinued     2 g 200 mL/hr over 30 Minutes Intravenous Every 24 hours 01/16/18 0032 01/18/18 1503   01/15/18 1630  ceFEPIme (MAXIPIME) 2 g in sodium chloride 0.9 % 100 mL IVPB     2 g 200 mL/hr over 30 Minutes Intravenous  Once 01/15/18 1627 01/15/18 1828      Subjective: Ambulating. Still feeling weak  Objective: Vitals:   01/28/18 1055 01/28/18 1104 01/28/18 1124 01/28/18 1428  BP: 105/77 91/62 105/69 97/81  Pulse: (!) 130 (!) 107 (!) 107 81  Resp:      Temp:      TempSrc:      SpO2:      Weight:      Height:        Intake/Output Summary (Last 24 hours) at 01/28/2018 1457 Last  data filed at 01/28/2018 1153 Gross per 24 hour  Intake 600 ml  Output 1 ml  Net 599 ml   Filed Weights   01/24/18 2000  Weight: 65.8 kg    Examination: General exam: Conversant, in no acute distress Respiratory system: normal chest rise, clear, no audible wheezing Cardiovascular system: irregularly irregular, s1, s2 Gastrointestinal system: Nondistended, nontender, pos BS Central nervous system: No seizures, no  tremors Extremities: No cyanosis, no joint deformities Skin: No rashes, no pallor Psychiatry: Affect normal // no auditory hallucinations   Data Reviewed: I have personally reviewed following labs and imaging studies  CBC: Recent Labs  Lab 01/24/18 0630 01/25/18 0503 01/26/18 0302 01/27/18 0400 01/28/18 0356  WBC 2.2* 2.7* 3.0* 7.3 19.3*  NEUTROABS 1.2* 1.4* 1.2* 3.4 14.3*  HGB 10.2* 9.7* 9.6* 10.0* 9.7*  HCT 32.6* 30.7* 30.7* 31.8* 30.8*  MCV 91.1 89.2 90.3 88.6 90.3  PLT 270 237 231 225 300   Basic Metabolic Panel: Recent Labs  Lab 01/22/18 0630 01/24/18 0630 01/25/18 0503 01/26/18 0302 01/27/18 0400 01/28/18 0356  NA 138 135 131* 132* 131* 135  K 3.3* 3.4* 3.4* 3.3* 3.0* 3.2*  CL 104 101 98 98 98 101  CO2 26 25 25 24 25 24   GLUCOSE 103* 99 80 77 92 73  BUN 6* 13 11 8  7* <5*  CREATININE 0.57 0.54 0.47 0.47 0.54 0.56  CALCIUM 8.1* 7.7* 7.8* 7.9* 8.0* 8.0*  MG 2.2 2.1  --   --   --  1.8  PHOS 2.8  --   --   --   --   --    GFR: Estimated Creatinine Clearance: 55.8 mL/min (by C-G formula based on SCr of 0.56 mg/dL). Liver Function Tests: Recent Labs  Lab 01/27/18 0400  AST 15  ALT 14  ALKPHOS 79  BILITOT 0.5  PROT 5.4*  ALBUMIN 2.3*   No results for input(s): LIPASE, AMYLASE in the last 168 hours. No results for input(s): AMMONIA in the last 168 hours. Coagulation Profile: No results for input(s): INR, PROTIME in the last 168 hours. Cardiac Enzymes: No results for input(s): CKTOTAL, CKMB, CKMBINDEX, TROPONINI in the last 168 hours. BNP (last 3 results) No results for input(s): PROBNP in the last 8760 hours. HbA1C: No results for input(s): HGBA1C in the last 72 hours. CBG: No results for input(s): GLUCAP in the last 168 hours. Lipid Profile: No results for input(s): CHOL, HDL, LDLCALC, TRIG, CHOLHDL, LDLDIRECT in the last 72 hours. Thyroid Function Tests: No results for input(s): TSH, T4TOTAL, FREET4, T3FREE, THYROIDAB in the last 72 hours. Anemia  Panel: No results for input(s): VITAMINB12, FOLATE, FERRITIN, TIBC, IRON, RETICCTPCT in the last 72 hours. Sepsis Labs: No results for input(s): PROCALCITON, LATICACIDVEN in the last 168 hours.  No results found for this or any previous visit (from the past 240 hour(s)).   Radiology Studies: No results found.  Scheduled Meds: . diltiazem  60 mg Oral Q6H  . docusate sodium  100 mg Oral BID  . famotidine  40 mg Oral Daily  . fluticasone  2 spray Each Nare Daily  . hydrocortisone   Rectal BID  . Influenza vac split quadrivalent PF  0.5 mL Intramuscular Tomorrow-1000  . levothyroxine  75 mcg Oral Q0600  . metoprolol succinate  25 mg Oral Once  . [START ON 01/29/2018] metoprolol succinate  50 mg Oral Daily  . pneumococcal 23 valent vaccine  0.5 mL Intramuscular Tomorrow-1000  . polyethylene glycol  17 g Oral  Daily  . rivaroxaban  20 mg Oral Q supper  . senna  1 tablet Oral BID   Continuous Infusions: . sodium chloride 50 mL/hr at 01/27/18 1620  . sodium chloride    . sodium chloride    . famotidine (PEPCID) IV       LOS: 12 days   Marylu Lund, MD [facility] Pager On Amion  If 7PM-7AM, please contact night-coverage 01/28/2018, 2:57 PM

## 2018-01-28 NOTE — Discharge Instructions (Addendum)
Abdominal Pain, Adult  Many things can cause belly (abdominal) pain. Most times, belly pain is not dangerous. Many cases of belly pain can be watched and treated at home. Sometimes belly pain is serious, though. Your doctor will try to find the cause of your belly pain. Follow these instructions at home:  Take over-the-counter and prescription medicines only as told by your doctor. Do not take medicines that help you poop (laxatives) unless told to by your doctor.  Drink enough fluid to keep your pee (urine) clear or pale yellow.  Watch your belly pain for any changes.  Keep all follow-up visits as told by your doctor. This is important. Contact a doctor if:  Your belly pain changes or gets worse.  You are not hungry, or you lose weight without trying.  You are having trouble pooping (constipated) or have watery poop (diarrhea) for more than 2-3 days.  You have pain when you pee or poop.  Your belly pain wakes you up at night.  Your pain gets worse with meals, after eating, or with certain foods.  You are throwing up and cannot keep anything down.  You have a fever. Get help right away if:  Your pain does not go away as soon as your doctor says it should.  You cannot stop throwing up.  Your pain is only in areas of your belly, such as the right side or the left lower part of the belly.  You have bloody or black poop, or poop that looks like tar.  You have very bad pain, cramping, or bloating in your belly.  You have signs of not having enough fluid or water in your body (dehydration), such as: ? Dark pee, very little pee, or no pee. ? Cracked lips. ? Dry mouth. ? Sunken eyes. ? Sleepiness. ? Weakness. This information is not intended to replace advice given to you by your health care provider. Make sure you discuss any questions you have with your health care provider. Document Released: 06/15/2007 Document Revised: 07/17/2015 Document Reviewed:  06/10/2015 Elsevier Interactive Patient Education  2019 [employer].   Bowel Obstruction A bowel obstruction means that something is blocking the small or large bowel. The bowel is also called the intestine. It is the long tube that connects the stomach to the opening of the butt (anus). When something blocks the bowel, food and fluids cannot pass through like normal. This condition needs to be treated. Treatment depends on the cause of the problem and how bad the problem is. What are the causes? Common causes of this condition include:  Scar tissue (adhesions) from past surgery or from high-energy X-rays (radiation).  Recent surgery in the belly. This affects how food moves in the bowel.  Some diseases, such as: ? Irritation of the lining of the digestive tract (Crohn's disease). ? Irritation of small pouches in the bowel (diverticulitis).  Growths or tumors.  A bulging organ (hernia).  Twisting of the bowel (volvulus).  A foreign body.  Slipping of a part of the bowel into another part (intussusception). What are the signs or symptoms? Symptoms of this condition include:  Pain in the belly.  Feeling sick to your stomach (nauseous).  Throwing up (vomiting).  Bloating in the belly.  Being unable to pass gas.  Trouble pooping (constipation).  Watery poop (diarrhea).  A lot of belching. How is this diagnosed? This condition may be diagnosed based on:  A physical exam.  Medical history.  Imaging tests, such as  X-ray or CT scan.  Blood tests.  Urine tests. How is this treated? Treatment for this condition may include:  Fluids and pain medicines that are given through an IV tube. Your doctor may tell you not to eat or drink if you feel sick to your stomach and are throwing up.  Eating a clear liquid diet for a few days.  Putting a small tube (nasogastric tube) into the stomach. This will help with pain, discomfort, and nausea by removing blocked air and  fluids from the stomach.  Surgery. This may be needed if other treatments do not work. Follow these instructions at home: Medicines  Take over-the-counter and prescription medicines only as told by your doctor.  If you were prescribed an antibiotic medicine, take it as told by your doctor. Do not stop taking the antibiotic even if you start to feel better. General instructions  Follow your diet as told by your doctor. You may need to: ? Only drink clear liquids until you start to get better. ? Avoid solid foods.  Return to your normal activities as told by your doctor. Ask your doctor what activities are safe for you.  Do not sit for a long time without moving. Get up to take short walks every 1-2 hours. This is important. Ask for help if you feel weak or unsteady.  Keep all follow-up visits as told by your doctor. This is important. How is this prevented? After having a bowel obstruction, you may be more likely to have another. You can do some things to stop it from happening again.  If you have a long-term (chronic) disease, contact your doctor if you see changes or problems.  Take steps to prevent or treat trouble pooping. Your doctor may ask that you: ? Drink enough fluid to keep your pee (urine) pale yellow. ? Take over-the-counter or prescription medicines. ? Eat foods that are high in fiber. These include beans, whole grains, and fresh fruits and vegetables. ? Limit foods that are high in fat and sugar. These include fried or sweet foods.  Stay active. Ask your doctor which exercises are safe for you.  Avoid stress.  Eat three small meals and three small snacks each day.  Work with a Publishing rights manager (dietitian) to make a meal plan that works for you.  Do not use any products that contain nicotine or tobacco, such as cigarettes and e-cigarettes. If you need help quitting, ask your doctor. Contact a doctor if:  You have a fever.  You have chills. Get help right away  if:  You have pain or cramps that get worse.  You throw up blood.  You are sick to your stomach.  You cannot stop throwing up.  You cannot drink fluids.  You feel mixed up (confused).  You feel very thirsty (dehydrated).  Your belly gets more bloated.  You feel weak or you pass out (faint). Summary  A bowel obstruction means that something is blocking the small or large bowel.  Treatment may include IV fluids and pain medicine. You may also have a clear liquid diet, a small tube in your stomach, or surgery.  Drink clear liquids and avoid solid foods until you get better. This information is not intended to replace advice given to you by your health care provider. Make sure you discuss any questions you have with your health care provider. Document Released: 02/04/2004 Document Revised: 05/10/2017 Document Reviewed: 05/10/2017 Elsevier Interactive Patient Education  2019 [place] on my medicine -  XARELTO (Rivaroxaban)  Why was Xarelto prescribed for you? Xarelto was prescribed for you to reduce the risk of a blood clot forming that can cause a stroke if you have a medical condition called atrial fibrillation (a type of irregular heartbeat).  What do you need to know about xarelto ? Take your Xarelto ONCE DAILY at the same time every day with your evening meal. If you have difficulty swallowing the tablet whole, you may crush it and mix in applesauce just prior to taking your dose.  Take Xarelto exactly as prescribed by your doctor and DO NOT stop taking Xarelto without talking to the doctor who prescribed the medication.  Stopping without other stroke prevention medication to take the place of Xarelto may increase your risk of developing a clot that causes a stroke.  Refill your prescription before you run out.  After discharge, you should have regular check-up appointments with your healthcare provider that is prescribing your Xarelto.  In the future  your dose may need to be changed if your kidney function or weight changes by a significant amount.  What do you do if you miss a dose? If you are taking Xarelto ONCE DAILY and you miss a dose, take it as soon as you remember on the same day then continue your regularly scheduled once daily regimen the next day. Do not take two doses of Xarelto at the same time or on the same day.   Important Safety Information A possible side effect of Xarelto is bleeding. You should call your healthcare provider right away if you experience any of the following: ? Bleeding from an injury or your nose that does not stop. ? Unusual colored urine (red or dark brown) or unusual colored stools (red or black). ? Unusual bruising for unknown reasons. ? A serious fall or if you hit your head (even if there is no bleeding).  Some medicines may interact with Xarelto and might increase your risk of bleeding while on Xarelto. To help avoid this, consult your healthcare provider or pharmacist prior to using any new prescription or non-prescription medications, including herbals, vitamins, non-steroidal anti-inflammatory drugs (NSAIDs) and supplements.  This website has more information on Xarelto: [email or web address]-benson.[email or web address]

## 2018-01-28 NOTE — Progress Notes (Signed)
Patient refused to have NG tube removed.  Patient wants to leave the NG tube in until she has some more full liquids.  Dr. Wyline Copas on unit and notified.

## 2018-01-29 DIAGNOSIS — Z0189 Encounter for other specified special examinations: Secondary | ICD-10-CM

## 2018-01-29 LAB — CBC WITH DIFFERENTIAL/PLATELET
Abs Immature Granulocytes: 0.4 10*3/uL — ABNORMAL HIGH (ref 0.00–0.07)
Band Neutrophils: 4 %
Basophils Absolute: 0 10*3/uL (ref 0.0–0.1)
Basophils Relative: 0 %
Eosinophils Absolute: 0.4 10*3/uL (ref 0.0–0.5)
Eosinophils Relative: 2 %
HCT: 30.4 % — ABNORMAL LOW (ref 36.0–46.0)
Hemoglobin: 9.6 g/dL — ABNORMAL LOW (ref 12.0–15.0)
Lymphocytes Relative: 9 %
Lymphs Abs: 1.8 10*3/uL (ref 0.7–4.0)
MCH: 28 pg (ref 26.0–34.0)
MCHC: 31.6 g/dL (ref 30.0–36.0)
MCV: 88.6 fL (ref 80.0–100.0)
MONOS PCT: 2 %
Metamyelocytes Relative: 2 %
Monocytes Absolute: 0.4 10*3/uL (ref 0.1–1.0)
Neutro Abs: 16.9 10*3/uL — ABNORMAL HIGH (ref 1.7–7.7)
Neutrophils Relative %: 81 %
Platelets: 246 10*3/uL (ref 150–400)
RBC: 3.43 MIL/uL — ABNORMAL LOW (ref 3.87–5.11)
RDW: 13.7 % (ref 11.5–15.5)
WBC: 19.9 10*3/uL — ABNORMAL HIGH (ref 4.0–10.5)
nRBC: 0.5 % — ABNORMAL HIGH (ref 0.0–0.2)

## 2018-01-29 LAB — BASIC METABOLIC PANEL
Anion gap: 8 (ref 5–15)
CO2: 25 mmol/L (ref 22–32)
Calcium: 7.8 mg/dL — ABNORMAL LOW (ref 8.9–10.3)
Chloride: 104 mmol/L (ref 98–111)
Creatinine, Ser: 0.58 mg/dL (ref 0.44–1.00)
GFR calc Af Amer: >60 mL/min (ref >60)
GFR calc non Af Amer: >60 mL/min (ref >60)
Glucose, Bld: 92 mg/dL (ref 70–99)
Potassium: 3 mmol/L — ABNORMAL LOW (ref 3.5–5.1)
Sodium: 137 mmol/L (ref 135–145)

## 2018-01-29 LAB — MAGNESIUM: Magnesium: 1.8 mg/dL (ref 1.7–2.4)

## 2018-01-29 MED ORDER — MAGNESIUM SULFATE 2 GM/50ML IV SOLN
2.0000 g | Freq: Once | INTRAVENOUS | Status: AC
  Administered 2018-01-29: 2 g via INTRAVENOUS
  Filled 2018-01-29: qty 50

## 2018-01-29 MED ORDER — METOPROLOL SUCCINATE ER 50 MG PO TB24
50.0000 mg | ORAL_TABLET | Freq: Every day | ORAL | Status: DC
  Administered 2018-01-30 – 2018-02-02 (×4): 50 mg via ORAL
  Filled 2018-01-29 (×4): qty 1

## 2018-01-29 MED ORDER — POTASSIUM CHLORIDE CRYS ER 20 MEQ PO TBCR
40.0000 meq | EXTENDED_RELEASE_TABLET | Freq: Two times a day (BID) | ORAL | Status: AC
  Administered 2018-01-29 (×2): 40 meq via ORAL
  Filled 2018-01-29 (×2): qty 2

## 2018-01-29 MED ORDER — METOPROLOL SUCCINATE ER 25 MG PO TB24
25.0000 mg | ORAL_TABLET | Freq: Once | ORAL | Status: AC
  Administered 2018-01-29: 25 mg via ORAL
  Filled 2018-01-29: qty 1

## 2018-01-29 MED ORDER — HYDROMORPHONE HCL 1 MG/ML IJ SOLN
2.0000 mg | INTRAMUSCULAR | Status: DC | PRN
  Administered 2018-01-30: 2 mg via INTRAVENOUS
  Filled 2018-01-29: qty 2

## 2018-01-29 NOTE — Progress Notes (Signed)
Haley [name]   DOB:November 08, 1945   XT#:[id number]    Assessment & Plan:   Locally advanced cancer with carcinomatosis, suspicious for ovarian cancer She tolerated cycle 1 day 1 of carboplatin and Taxol on January 18, 2018. Continue supportive care Her next dose of chemotherapy hopefully will be in the outpatient setting around February 08, 2018.  Nausea, vomiting, bloating and constipation Her symptoms are most consistent with carcinomatosis/partial small bowel obstruction They are resolving.  NG tube has been removed.  She will continue conservative management and advance diet as tolerated.  I have increased the dose of IV Dilaudid a little bit for better symptom management per patient request  Malignant ascites status post therapeutic paracentesis on January 15, 2018 She underwent repeat paracentesis on January 23, 2018. She felt better today  Severe protein calorie malnutrition We will start her on clear liquids with plan to advance her diet slowly as tolerated over the next few days  Mild leukopenia, resolved She was startedon daily G-CSF support on 01/24/2018,until ANC is greater than 1.5  Paroxysmal atrial fibrillation She is on telemetry unit along with beta-blocker I will defer to primary service There is no contraindication for her to be on anticoagulation therapyifindicated, as long as platelet count is >50,000  Discharge planning Hopefully the next few days if we can advance her diet back to full diet  Heath Lark, MD 01/29/2018  7:06 AM   Subjective:  She felt better.  NG tube was removed.  She still have frequent nausea with small oral intake but denies excessive nausea or bloating.  She has spontaneous bowel movement.  Her abdominal pain is less.  She would like her IV pain medicine to be a bit stronger to control her pain better  Objective:  Vitals:   01/28/18 2232 01/29/18 0506  BP: 130/76 (!) 100/56  Pulse: 81 77  Resp: 17 16  Temp: 97.8 F (36.6 C) 97.7  F (36.5 C)  SpO2: 93% 94%     Intake/Output Summary (Last 24 hours) at 01/29/2018 0706 Last data filed at 01/28/2018 1153 Gross per 24 hour  Intake 600 ml  Output -  Net 600 ml    GENERAL:alert, no distress and comfortable SKIN: skin color, texture, turgor are normal, no rashes or significant lesions EYES: normal, Conjunctiva are pink and non-injected, sclera clear OROPHARYNX:no exudate, no erythema and lips, buccal mucosa, and tongue normal  NECK: supple, thyroid normal size, non-tender, without nodularity LYMPH:  no palpable lymphadenopathy in the cervical, axillary or inguinal LUNGS: clear to auscultation and percussion with normal breathing effort HEART: Irregular rate and rhythm, no murmurs and no lower extremity edema ABDOMEN:abdomen soft, mild distension, non-tender and normal bowel sounds Musculoskeletal:no cyanosis of digits and no clubbing  NEURO: alert & oriented x 3 with fluent speech, no focal motor/sensory deficits   Labs:  Lab Results  Component Value Date   WBC 19.9 (H) 01/29/2018   HGB 9.6 (L) 01/29/2018   HCT 30.4 (L) 01/29/2018   MCV 88.6 01/29/2018   PLT 246 01/29/2018   NEUTROABS 16.9 (H) 01/29/2018    Lab Results  Component Value Date   NA 137 01/29/2018   K 3.0 (L) 01/29/2018   CL 104 01/29/2018   CO2 25 01/29/2018    Studies:  No results found.

## 2018-01-29 NOTE — Progress Notes (Signed)
PROGRESS NOTE    Haley [name]  KDX:[id number] DOB: 05-Sep-1945 DOA: 01/15/2018 PCP: Martin[place], Anavi G, MD    Brief Narrative:  73 year old female who presented with abdominal pain, she does have significant past medical history for ovarian cancer,complicated with peritoneal carcinomatosis and malignant ascites. Patient developed abdominal pain, that was refractory to analgesics and paracentesis. She was afebrile, blood pressure 112/62, heart rate 65, respiratory rate 18, oxygen saturation 95%, she had dry mucous membranes, lungs clear to auscultation bilaterally, heart S1-S2 present and rhythmic,her abdomen was mildly distended, tender to palpation no masses palpable, no lower extremity edema. Her abdomen and pelvis CTshowed dilated fluid filled loops of small bowel with transition point likely in the right adnexa adjacent to, cystic mass.  Patient was admitted to the hospital with working diagnosis of small bowel obstruction.  Assessment & Plan:   Principal Problem:   Partial small bowel obstruction (HCC) Active Problems:   Hypothyroidism   GERD   PAF (paroxysmal atrial fibrillation) (HCC)   Carcinomatosis (HCC)   Abdominal pain   Ovarian cancer ([place]) suspected   Encounter for imaging study to confirm nasogastric (NG) tube placement  1. Small bowel obstruction due to ovarian cancer with carcinomatosis. Noted to have worsening symptoms, distention, pain, nausea and vomiting. NG tube placed and connected to low intermittent suction.  -recently with multiple BM. Pos BS on exam - Tolerating Full Liquids. Advanced to soft diet - NG removed -Continue to encourage ambulation  2. HTN. -Noted to have elevated HR on mild exertion -On PO bp beta blocker -Cont on PO cardizem -Titrate meds as tolerated  3. Dyslipidemia.Held on oral medications for now. Remains stable  4. Hypothyroid.now on PO thyroid replacement  5. Paroxysmal atrial fibrillation.Recently noted to be in RVR  despite PRN beta blocer. Improved with cardizem gtt. HR remains stable. Now on PO beta blocker and PO cardizem. HR remains labile, currently tachycardic in the 130's Will give IV labetalol PRN. Most recent 2d echo from 2010. Will request repeat echo. Tolerating cardizem and beta blocker  6. Hypophosphatemiaand hypokalemia.Continue to replace lytes as needed. Replace BMET in AM, cont to replace as needed  7. New back pain. Continue as needed IV analgesics.   DVT prophylaxis: SCD's Code Status: Full Family Communication: Pt in room, family not at bedside Disposition Plan: Uncertain at this time  Consultants:   Oncology  Gyn Onc  Procedures:     Antimicrobials: Anti-infectives (From admission, onward)   Start     Dose/Rate Route Frequency Ordered Stop   01/17/18 1200  ceFAZolin (ANCEF) IVPB 2g/100 mL premix     2 g 200 mL/hr over 30 Minutes Intravenous To Radiology 01/17/18 1156 01/17/18 1330   01/16/18 0100  cefTRIAXone (ROCEPHIN) 2 g in sodium chloride 0.9 % 100 mL IVPB  Status:  Discontinued     2 g 200 mL/hr over 30 Minutes Intravenous Every 24 hours 01/16/18 0032 01/18/18 1503   01/15/18 1630  ceFEPIme (MAXIPIME) 2 g in sodium chloride 0.9 % 100 mL IVPB     2 g 200 mL/hr over 30 Minutes Intravenous  Once 01/15/18 1627 01/15/18 1828      Subjective: Wanting to advance diet  Objective: Vitals:   01/28/18 2232 01/29/18 0506 01/29/18 1209 01/29/18 1353  BP: 130/76 (!) 100/56 116/73 115/77  Pulse: 81 77  (!) 104  Resp: 17 16  16   Temp: 97.8 F (36.6 C) 97.7 F (36.5 C)  98.1 F (36.7 C)  TempSrc: Oral Oral  Oral  SpO2: 93% 94%  94%  Weight:      Height:       No intake or output data in the 24 hours ending 01/29/18 1437 Filed Weights   01/24/18 2000  Weight: 65.8 kg    Examination: General exam: Awake, laying in bed, in nad Respiratory system: Normal respiratory effort, no wheezing Cardiovascular system: irregularly irregular, s1, s2 Gastrointestinal  system: Soft, nondistended, positive BS Central nervous system: CN2-12 grossly intact, strength intact Extremities: Perfused, no clubbing Skin: Normal skin turgor, no notable skin lesions seen Psychiatry: Mood normal // no visual hallucinations   Data Reviewed: I have personally reviewed following labs and imaging studies  CBC: Recent Labs  Lab 01/25/18 0503 01/26/18 0302 01/27/18 0400 01/28/18 0356 01/29/18 0438  WBC 2.7* 3.0* 7.3 19.3* 19.9*  NEUTROABS 1.4* 1.2* 3.4 14.3* 16.9*  HGB 9.7* 9.6* 10.0* 9.7* 9.6*  HCT 30.7* 30.7* 31.8* 30.8* 30.4*  MCV 89.2 90.3 88.6 90.3 88.6  PLT 237 231 225 251 170   Basic Metabolic Panel: Recent Labs  Lab 01/24/18 0630 01/25/18 0503 01/26/18 0302 01/27/18 0400 01/28/18 0356 01/29/18 0438  NA 135 131* 132* 131* 135 137  K 3.4* 3.4* 3.3* 3.0* 3.2* 3.0*  CL 101 98 98 98 101 104  CO2 25 25 24 25 24 25   GLUCOSE 99 80 77 92 73 92  BUN 13 11 8  7* <5* <5*  CREATININE 0.54 0.47 0.47 0.54 0.56 0.58  CALCIUM 7.7* 7.8* 7.9* 8.0* 8.0* 7.8*  MG 2.1  --   --   --  1.8 1.8   GFR: Estimated Creatinine Clearance: 55.8 mL/min (by C-G formula based on SCr of 0.58 mg/dL). Liver Function Tests: Recent Labs  Lab 01/27/18 0400  AST 15  ALT 14  ALKPHOS 79  BILITOT 0.5  PROT 5.4*  ALBUMIN 2.3*   No results for input(s): LIPASE, AMYLASE in the last 168 hours. No results for input(s): AMMONIA in the last 168 hours. Coagulation Profile: No results for input(s): INR, PROTIME in the last 168 hours. Cardiac Enzymes: No results for input(s): CKTOTAL, CKMB, CKMBINDEX, TROPONINI in the last 168 hours. BNP (last 3 results) No results for input(s): PROBNP in the last 8760 hours. HbA1C: No results for input(s): HGBA1C in the last 72 hours. CBG: No results for input(s): GLUCAP in the last 168 hours. Lipid Profile: No results for input(s): CHOL, HDL, LDLCALC, TRIG, CHOLHDL, LDLDIRECT in the last 72 hours. Thyroid Function Tests: No results for  input(s): TSH, T4TOTAL, FREET4, T3FREE, THYROIDAB in the last 72 hours. Anemia Panel: No results for input(s): VITAMINB12, FOLATE, FERRITIN, TIBC, IRON, RETICCTPCT in the last 72 hours. Sepsis Labs: No results for input(s): PROCALCITON, LATICACIDVEN in the last 168 hours.  No results found for this or any previous visit (from the past 240 hour(s)).   Radiology Studies: No results found.  Scheduled Meds: . diltiazem  60 mg Oral Q6H  . docusate sodium  100 mg Oral BID  . famotidine  40 mg Oral Daily  . fluticasone  2 spray Each Nare Daily  . hydrocortisone   Rectal BID  . Influenza vac split quadrivalent PF  0.5 mL Intramuscular Tomorrow-1000  . levothyroxine  75 mcg Oral Q0600  . metoprolol succinate  25 mg Oral Daily  . pneumococcal 23 valent vaccine  0.5 mL Intramuscular Tomorrow-1000  . polyethylene glycol  17 g Oral Daily  . potassium chloride  40 mEq Oral BID  . rivaroxaban  20 mg Oral Q supper  .  senna  1 tablet Oral BID   Continuous Infusions: . sodium chloride 50 mL/hr at 01/27/18 1620  . sodium chloride    . sodium chloride    . famotidine (PEPCID) IV       LOS: 13 days   Marylu Lund, MD [facility] Pager On Amion  If 7PM-7AM, please contact night-coverage 01/29/2018, 2:37 PM

## 2018-01-30 ENCOUNTER — Inpatient Hospital Stay (HOSPITAL_COMMUNITY): Payer: PPO

## 2018-01-30 DIAGNOSIS — I480 Paroxysmal atrial fibrillation: Secondary | ICD-10-CM

## 2018-01-30 LAB — BASIC METABOLIC PANEL
Anion gap: 5 (ref 5–15)
BUN: 5 mg/dL — ABNORMAL LOW (ref 8–23)
CALCIUM: 7.8 mg/dL — AB (ref 8.9–10.3)
CO2: 25 mmol/L (ref 22–32)
Chloride: 107 mmol/L (ref 98–111)
Creatinine, Ser: 0.63 mg/dL (ref 0.44–1.00)
GFR calc non Af Amer: >60 mL/min (ref >60)
Glucose, Bld: 105 mg/dL — ABNORMAL HIGH (ref 70–99)
Potassium: 3.4 mmol/L — ABNORMAL LOW (ref 3.5–5.1)
Sodium: 137 mmol/L (ref 135–145)

## 2018-01-30 MED ORDER — BOOST / RESOURCE BREEZE PO LIQD CUSTOM: 1.0000 | ORAL | Status: DC

## 2018-01-30 MED ORDER — DILTIAZEM HCL 30 MG PO TABS
30.0000 mg | ORAL_TABLET | Freq: Four times a day (QID) | ORAL | Status: DC
  Administered 2018-01-30 – 2018-02-02 (×13): 30 mg via ORAL
  Filled 2018-01-30 (×13): qty 1

## 2018-01-30 MED ORDER — POTASSIUM CHLORIDE CRYS ER 20 MEQ PO TBCR
40.0000 meq | EXTENDED_RELEASE_TABLET | Freq: Two times a day (BID) | ORAL | Status: AC
  Administered 2018-01-30 (×2): 40 meq via ORAL
  Filled 2018-01-30 (×2): qty 2

## 2018-01-30 MED ORDER — BOOST PLUS PO LIQD
237.0000 mL | ORAL | Status: DC
  Administered 2018-01-30 – 2018-02-02 (×3): 237 mL via ORAL
  Filled 2018-01-30 (×4): qty 237

## 2018-01-30 MED ORDER — METOCLOPRAMIDE HCL 5 MG PO TABS
5.0000 mg | ORAL_TABLET | Freq: Three times a day (TID) | ORAL | Status: DC
  Administered 2018-01-30 – 2018-02-02 (×10): 5 mg via ORAL
  Filled 2018-01-30 (×10): qty 1

## 2018-01-30 NOTE — Progress Notes (Signed)
[facility] for Lovenox --> Xarelto  Indication: atrial fibrillation  Allergies  Allergen Reactions  . Tramadol Hcl     REACTION: jittery  . Tramadol Nausea And Vomiting    Patient Measurements: Height: 5\' 2"  (157.5 cm) Weight: 145 lb 1 oz (65.8 kg) IBW/kg (Calculated) : 50.1  Vital Signs: Temp: 98.4 F (36.9 C) (01/21 0359) Temp Source: Oral (01/21 0359) BP: 103/66 (01/21 0359) Pulse Rate: 82 (01/21 0359)  Labs: Recent Labs    01/28/18 0356 01/29/18 0438 01/30/18 0530  HGB 9.7* 9.6*  --   HCT 30.8* 30.4*  --   PLT 251 246  --   CREATININE 0.56 0.58 0.63    Estimated Creatinine Clearance: 55.8 mL/min (by C-G formula based on SCr of 0.63 mg/dL).   Medical History: Past Medical History:  Diagnosis Date  . Allergy    SEASONAL  . Cataract    BILATERAL  . Fibromyalgia   . GERD (gastroesophageal reflux disease)   . H/O echocardiogram 04/28/08   EF>55% trace mitral regurgitation, No significant valvular pathology  . Hemorrhoids    internal  . History of stress test 02/08/2011   Normal Myocardial perfusion study, this is a low risk scan, No prior study available for comparison  . Hyperlipidemia   . MVP (mitral valve prolapse)    mild and MR  . OA (osteoarthritis)   . Osteoporosis   . Paroxysmal A-fib ([place])   . Sleep apnea    does not use prescribed CPAP  . Thyroid disease    HYPO  . Varicosities of leg   . Vitamin D deficiency     Medications:  Aspirin 81mg  daily PTA (not ordered on admission)  Assessment: 63 y/oF presented with abdominal pain, found to have SBO.  PMH significant for ovarian cancer s/p chemotherapy on 01/18/2018. She also developed new onset a-fib this admission. Pharmacy asked to start Lovenox on 01/26/18, now asked to transition patient to [place] as patient tolerating clears, advancing diet, and NG d/c'ed.   Today, 01/30/18:     SCr remains stable CrCl 56  CBC stable  No bleeding issues  noted  Goal of Therapy:  Appropriate dose for indication Prevention of stroke  Monitor platelets by anticoagulation protocol: Yes   Plan:   Continue Xarelto 20mg  PO daily   Monitor CBC (currently ordered as daily per MD), renal function   Monitor closely for s/sx of bleeding (noted has order for PRN ketorolac)  Patient has been educated about Xarelto   Adrian Saran, PharmD, BCPS Pager [phone] 01/30/2018 10:55 AM

## 2018-01-30 NOTE — Progress Notes (Signed)
PROGRESS NOTE    Haley [name]  ZDG:[id number] DOB: 12/06/1945 DOA: 01/15/2018 PCP: Martin[place], Lindaann G, MD    Brief Narrative:  73 year old female who presented with abdominal pain, she does have significant past medical history for ovarian cancer,complicated with peritoneal carcinomatosis and malignant ascites. Patient developed abdominal pain, that was refractory to analgesics and paracentesis. She was afebrile, blood pressure 112/62, heart rate 65, respiratory rate 18, oxygen saturation 95%, she had dry mucous membranes, lungs clear to auscultation bilaterally, heart S1-S2 present and rhythmic,her abdomen was mildly distended, tender to palpation no masses palpable, no lower extremity edema. Her abdomen and pelvis CTshowed dilated fluid filled loops of small bowel with transition point likely in the right adnexa adjacent to, cystic mass.  Patient was admitted to the hospital with working diagnosis of small bowel obstruction.  Assessment & Plan:   Principal Problem:   Partial small bowel obstruction (HCC) Active Problems:   Hypothyroidism   GERD   PAF (paroxysmal atrial fibrillation) (HCC)   Carcinomatosis (HCC)   Abdominal pain   Ovarian cancer ([place]) suspected   Encounter for imaging study to confirm nasogastric (NG) tube placement  1. Small bowel obstruction due to ovarian cancer with carcinomatosis. Initially noted to have worsening symptoms, distention, pain, nausea and vomiting. NG tube placed and connected to low intermittent suction.  -earlier noted to have multiple BM. Pos BS remain on exam - Now on soft diet, however unsure if patient is tolerating reliably - NG removed -Continue to encourage ambulation  2. HTN. -Noted to have elevated HR on mild exertion -On PO bp beta blocker -Now on PO cardizem per below  3. Dyslipidemia.Held on oral cholesterol medications for now until he can tolerate diet reliably  4. Hypothyroid.Continue on PO thyroid  replacement  5. Paroxysmal atrial fibrillation.Recently noted to be in RVR despite PRN beta blocer. Improved with cardizem gtt. HR remains stable. Now on PO beta blocker and PO cardizem. HR had been labile with periods of RVR on ambulation. -Had increased metoprolol to 50mg  -Noted to be mildly bradycardic this AM, decreased cardizem to 30mg  q6h.  -Most recent 2d echo from 2010. Requested repeat echo, results pending  6. Hypophosphatemiaand hypokalemia. -Continue to replace lytes as needed.  -Potassium remains low today -Will repeat BMET in AM, cont to replace lytes as needed  7. New back pain. Continue as needed IV analgesics.   DVT prophylaxis: SCD's Code Status: Full Family Communication: Pt in room, family is at bedside Disposition Plan: Uncertain at this time  Consultants:   Oncology  Gyn Onc  Procedures:     Antimicrobials: Anti-infectives (From admission, onward)   Start     Dose/Rate Route Frequency Ordered Stop   01/17/18 1200  ceFAZolin (ANCEF) IVPB 2g/100 mL premix     2 g 200 mL/hr over 30 Minutes Intravenous To Radiology 01/17/18 1156 01/17/18 1330   01/16/18 0100  cefTRIAXone (ROCEPHIN) 2 g in sodium chloride 0.9 % 100 mL IVPB  Status:  Discontinued     2 g 200 mL/hr over 30 Minutes Intravenous Every 24 hours 01/16/18 0032 01/18/18 1503   01/15/18 1630  ceFEPIme (MAXIPIME) 2 g in sodium chloride 0.9 % 100 mL IVPB     2 g 200 mL/hr over 30 Minutes Intravenous  Once 01/15/18 1627 01/15/18 1828      Subjective: Reports nausea and discomfort with current diet  Objective: Vitals:   01/29/18 1600 01/29/18 2150 01/30/18 0359 01/30/18 1420  BP: 112/73 118/75 103/66 (!) 100/57  Pulse: (!) 108 78 82 61  Resp:  18 16 14   Temp:  98.6 F (37 C) 98.4 F (36.9 C) 98.2 F (36.8 C)  TempSrc:  Oral Oral Oral  SpO2:  94% 92% 94%  Weight:      Height:        Intake/Output Summary (Last 24 hours) at 01/30/2018 1451 Last data filed at 01/30/2018 0600 Gross  per 24 hour  Intake 1040 ml  Output -  Net 1040 ml   Filed Weights   01/24/18 2000  Weight: 65.8 kg    Examination: General exam: Conversant, in no acute distress Respiratory system: normal chest rise, clear, no audible wheezing Cardiovascular system: regular rhythm, s1-s2 Gastrointestinal system: Nondistended, mildly distended, pos BS Central nervous system: No seizures, no tremors Extremities: No cyanosis, no joint deformities Skin: No rashes, no pallor Psychiatry: Affect normal // no auditory hallucinations   Data Reviewed: I have personally reviewed following labs and imaging studies  CBC: Recent Labs  Lab 01/25/18 0503 01/26/18 0302 01/27/18 0400 01/28/18 0356 01/29/18 0438  WBC 2.7* 3.0* 7.3 19.3* 19.9*  NEUTROABS 1.4* 1.2* 3.4 14.3* 16.9*  HGB 9.7* 9.6* 10.0* 9.7* 9.6*  HCT 30.7* 30.7* 31.8* 30.8* 30.4*  MCV 89.2 90.3 88.6 90.3 88.6  PLT 237 231 225 251 161   Basic Metabolic Panel: Recent Labs  Lab 01/24/18 0630  01/26/18 0302 01/27/18 0400 01/28/18 0356 01/29/18 0438 01/30/18 0530  NA 135   < > 132* 131* 135 137 137  K 3.4*   < > 3.3* 3.0* 3.2* 3.0* 3.4*  CL 101   < > 98 98 101 104 107  CO2 25   < > 24 25 24 25 25   GLUCOSE 99   < > 77 92 73 92 105*  BUN 13   < > 8 7* <5* <5* 5*  CREATININE 0.54   < > 0.47 0.54 0.56 0.58 0.63  CALCIUM 7.7*   < > 7.9* 8.0* 8.0* 7.8* 7.8*  MG 2.1  --   --   --  1.8 1.8  --    < > = values in this interval not displayed.   GFR: Estimated Creatinine Clearance: 55.8 mL/min (by C-G formula based on SCr of 0.63 mg/dL). Liver Function Tests: Recent Labs  Lab 01/27/18 0400  AST 15  ALT 14  ALKPHOS 79  BILITOT 0.5  PROT 5.4*  ALBUMIN 2.3*   No results for input(s): LIPASE, AMYLASE in the last 168 hours. No results for input(s): AMMONIA in the last 168 hours. Coagulation Profile: No results for input(s): INR, PROTIME in the last 168 hours. Cardiac Enzymes: No results for input(s): CKTOTAL, CKMB, CKMBINDEX,  TROPONINI in the last 168 hours. BNP (last 3 results) No results for input(s): PROBNP in the last 8760 hours. HbA1C: No results for input(s): HGBA1C in the last 72 hours. CBG: No results for input(s): GLUCAP in the last 168 hours. Lipid Profile: No results for input(s): CHOL, HDL, LDLCALC, TRIG, CHOLHDL, LDLDIRECT in the last 72 hours. Thyroid Function Tests: No results for input(s): TSH, T4TOTAL, FREET4, T3FREE, THYROIDAB in the last 72 hours. Anemia Panel: No results for input(s): VITAMINB12, FOLATE, FERRITIN, TIBC, IRON, RETICCTPCT in the last 72 hours. Sepsis Labs: No results for input(s): PROCALCITON, LATICACIDVEN in the last 168 hours.  No results found for this or any previous visit (from the past 240 hour(s)).   Radiology Studies: Dg Chest 2 View  Result Date: 01/30/2018 CLINICAL DATA:  Cough and congestion EXAM:  CHEST - 2 VIEW COMPARISON:  01/15/2018 FINDINGS: Cardiac shadows within normal limits. New right chest wall port is noted in satisfactory position. The lungs are well aerated bilaterally. Minimal scarring is noted in the re- left lung base stable from the recent CT. No focal confluent infiltrate is seen. Minimal left pleural effusion is noted. IMPRESSION: New minimal left pleural effusion. No other acute abnormality is noted. Electronically Signed   By: Inez Catalina M.D.   On: 01/30/2018 09:26    Scheduled Meds: . diltiazem  30 mg Oral Q6H  . docusate sodium  100 mg Oral BID  . famotidine  40 mg Oral Daily  . feeding supplement  1 Container Oral Q24H  . fluticasone  2 spray Each Nare Daily  . hydrocortisone   Rectal BID  . Influenza vac split quadrivalent PF  0.5 mL Intramuscular Tomorrow-1000  . lactose free nutrition  237 mL Oral Q24H  . levothyroxine  75 mcg Oral Q0600  . metoCLOPramide  5 mg Oral TID AC  . metoprolol succinate  50 mg Oral Daily  . pneumococcal 23 valent vaccine  0.5 mL Intramuscular Tomorrow-1000  . polyethylene glycol  17 g Oral Daily  .  potassium chloride  40 mEq Oral BID  . rivaroxaban  20 mg Oral Q supper  . senna  1 tablet Oral BID   Continuous Infusions: . sodium chloride 50 mL/hr at 01/30/18 0952  . sodium chloride    . sodium chloride    . famotidine (PEPCID) IV       LOS: 14 days   Marylu Lund, MD [facility] Pager On Amion  If 7PM-7AM, please contact night-coverage 01/30/2018, 2:51 PM

## 2018-01-30 NOTE — Progress Notes (Signed)
Haley [name]   DOB:[birth year]   QQ#:[id number]    Assessment & Plan:   Locally advanced cancer with carcinomatosis, suspicious for ovarian cancer She tolerated cycle 1 day 1 of carboplatin and Taxol on January 18, 2018. Continue supportive care Her next dose of chemotherapy hopefully will be in the outpatient setting around February 08, 2018.  Nausea, vomiting, bloating and constipation Her symptoms are most consistent with carcinomatosis. They are resolving.  NG tube has been removed.  She will continue conservative management and advance diet as tolerated.  I have increased the dose of IV Dilaudid a little bit for better symptom management per patient request and will give a trial of reglan for post-prandial bloating  Malignant ascites status post therapeutic paracentesis on January 15, 2018 She underwent repeat paracentesis on January 23, 2018. She felt better   Severe protein calorie malnutrition We will start her on clear liquids with plan to advance her diet slowly as tolerated over the next few days  Mild leukopenia, resolved She was startedon daily G-CSF support on 01/24/2018,until ANC is greater than 1.5  Cough with recent mild hemoptysis Will order CXR to evaluate. No further bleeding. Continue Xarelto  Paroxysmal atrial fibrillation She is on telemetry unit along with beta-blocker I will defer to primary service There is no contraindication for her to be on anticoagulation therapyifindicated, as long as platelet count is >50,000  Discharge planning Hopefully the next few days if we can advance her diet back to full diet without problems  Heath Lark, MD 01/30/2018  7:03 AM   Subjective:  She is complaining of post-prandial bloating without pain or vomiting. Has early satiety. Also complaining of mild productive cough. Had blood tinged sputum 2 days ago. No other signs of bleeding. Abdominal swelling as improved. Had 3 bowel movement yesterday  Objective:   Vitals:   01/29/18 2150 01/30/18 0359  BP: 118/75 103/66  Pulse: 78 82  Resp: 18 16  Temp: 98.6 F (37 C) 98.4 F (36.9 C)  SpO2: 94% 92%     Intake/Output Summary (Last 24 hours) at 01/30/2018 0703 Last data filed at 01/30/2018 0600 Gross per 24 hour  Intake 1040 ml  Output -  Net 1040 ml    GENERAL:alert, no distress and comfortable SKIN: skin color, texture, turgor are normal, no rashes or significant lesions EYES: normal, Conjunctiva are pink and non-injected, sclera clear OROPHARYNX:no exudate, no erythema and lips, buccal mucosa, and tongue normal  NECK: supple, thyroid normal size, non-tender, without nodularity LYMPH:  no palpable lymphadenopathy in the cervical, axillary or inguinal LUNGS: clear to auscultation and percussion with normal breathing effort HEART: irregular rate & rhythm and no murmurs and no lower extremity edema ABDOMEN:abdomen soft, non-tender and normal bowel sounds. Distension is improved Musculoskeletal:no cyanosis of digits and no clubbing  NEURO: alert & oriented x 3 with fluent speech, no focal motor/sensory deficits   Labs:  Lab Results  Component Value Date   WBC 19.9 (H) 01/29/2018   HGB 9.6 (L) 01/29/2018   HCT 30.4 (L) 01/29/2018   MCV 88.6 01/29/2018   PLT 246 01/29/2018   NEUTROABS 16.9 (H) 01/29/2018    Lab Results  Component Value Date   NA 137 01/30/2018   K 3.4 (L) 01/30/2018   CL 107 01/30/2018   CO2 25 01/30/2018    Studies:  No results found.

## 2018-01-30 NOTE — Progress Notes (Signed)
Nutrition Follow-up  DOCUMENTATION CODES:   Not applicable  INTERVENTION:    Trial Boost Breeze po once daily, each supplement provides 250 kcal and 9 grams of protein  Trial Boost Plus chocolate once daily- Each supplement provides 360kcal and 14g protein.   Magic cup BID with meals, each supplement provides 290 kcal and 9 grams of protein   NUTRITION DIAGNOSIS:   Inadequate oral intake related to nausea, early satiety as evidenced by meal completion < 25%.  Ongoing  GOAL:   Patient will meet greater than or equal to 90% of their needs  Not meeting  MONITOR:   PO intake, Supplement acceptance, Weight trends, Labs, I & O's, Diet advancement  REASON FOR ASSESSMENT:   Consult Calorie Count  ASSESSMENT:   73 year old female who presented with abdominal pain, she does have significant past medical history for ovarian cancer, complicated with peritoneal carcinomatosis and malignant ascites.   1/14- paracentesis- 450 ml drained, NGT placed  1/19- NGT out  1/20- diet advanced to soft   No meal completions charted for the last 5 days. Pt states she ate grits and yogurt yesterday without complication. She has not attempted to eat anything today due to feeling of nausea/bloating. Discussed the importance of high calorie high protein intake for preservation of lean body mass. Pt does not like Ensure but is willing to try Boost. Pt having consistent BMs.   A weight has not been obtained since 1/15. Recommend checking daily weights to monitor trends.   Medications reviewed and include: colace, reglan 5 mg TID, miralax, senokot, NS @ 50 ml/hr Labs reviewed: K 3.4 (L)   Diet Order:   Diet Order            DIET SOFT Room service appropriate? Yes; Fluid consistency: Thin  Diet effective now             EDUCATION NEEDS:   Education needs have been addressed  Skin:  Skin Assessment: Reviewed RN Assessment  Last BM:  1/20  Height:   Ht Readings from Last 1  Encounters:  01/24/18 5\' 2"  (1.575 m)    Weight:   Wt Readings from Last 1 Encounters:  01/24/18 65.8 kg    Ideal Body Weight:  50 kg  BMI:  Body mass index is 26.53 kg/m.  Estimated Nutritional Needs:   Kcal:  1700-1900  Protein:  75-85g  Fluid:  1.8L/day   Mariana Single RD, LDN Clinical Nutrition Pager # [phone]

## 2018-01-30 NOTE — Progress Notes (Signed)
Attempted echo at 1:30. Patient eating. Asked me to come back.   Haley [name] L Androw 01/30/2018, 1:34 PM

## 2018-01-30 NOTE — Progress Notes (Signed)
Rt gave pt flutter valve. Pt knows and understands how to use. 

## 2018-01-31 ENCOUNTER — Inpatient Hospital Stay (HOSPITAL_COMMUNITY): Payer: PPO

## 2018-01-31 DIAGNOSIS — I361 Nonrheumatic tricuspid (valve) insufficiency: Secondary | ICD-10-CM

## 2018-01-31 LAB — CBC WITH DIFFERENTIAL/PLATELET
Abs Immature Granulocytes: 0.3 10*3/uL — ABNORMAL HIGH (ref 0.00–0.07)
Band Neutrophils: 5 %
Basophils Absolute: 0 10*3/uL (ref 0.0–0.1)
Basophils Relative: 0 %
Eosinophils Absolute: 0 10*3/uL (ref 0.0–0.5)
Eosinophils Relative: 0 %
HCT: 33.6 % — ABNORMAL LOW (ref 36.0–46.0)
Hemoglobin: 10.5 g/dL — ABNORMAL LOW (ref 12.0–15.0)
Lymphocytes Relative: 29 %
Lymphs Abs: 3.9 10*3/uL (ref 0.7–4.0)
MCH: 28.2 pg (ref 26.0–34.0)
MCHC: 31.3 g/dL (ref 30.0–36.0)
MCV: 90.1 fL (ref 80.0–100.0)
MONOS PCT: 4 %
Metamyelocytes Relative: 2 %
Monocytes Absolute: 0.5 10*3/uL (ref 0.1–1.0)
Neutro Abs: 8.6 10*3/uL — ABNORMAL HIGH (ref 1.7–7.7)
Neutrophils Relative %: 60 %
Platelets: 275 10*3/uL (ref 150–400)
RBC: 3.73 MIL/uL — AB (ref 3.87–5.11)
RDW: 14.1 % (ref 11.5–15.5)
WBC: 13.3 10*3/uL — ABNORMAL HIGH (ref 4.0–10.5)
nRBC: 1 /100 WBC — ABNORMAL HIGH
nRBC: 1.1 % — ABNORMAL HIGH (ref 0.0–0.2)

## 2018-01-31 LAB — BASIC METABOLIC PANEL
Anion gap: 6 (ref 5–15)
BUN: 9 mg/dL (ref 8–23)
CO2: 24 mmol/L (ref 22–32)
CREATININE: 0.62 mg/dL (ref 0.44–1.00)
Calcium: 8.4 mg/dL — ABNORMAL LOW (ref 8.9–10.3)
Chloride: 107 mmol/L (ref 98–111)
GFR calc Af Amer: >60 mL/min (ref >60)
GFR calc non Af Amer: >60 mL/min (ref >60)
Glucose, Bld: 102 mg/dL — ABNORMAL HIGH (ref 70–99)
Potassium: 3.8 mmol/L (ref 3.5–5.1)
Sodium: 137 mmol/L (ref 135–145)

## 2018-01-31 LAB — ECHOCARDIOGRAM COMPLETE
Height: 62 in
Weight: 2321 oz

## 2018-01-31 MED ORDER — GABAPENTIN 100 MG PO CAPS
100.0000 mg | ORAL_CAPSULE | Freq: Every day | ORAL | Status: DC | PRN
  Filled 2018-01-31: qty 1

## 2018-01-31 MED ORDER — HYDROMORPHONE HCL 2 MG PO TABS
4.0000 mg | ORAL_TABLET | ORAL | Status: DC | PRN
  Administered 2018-01-31 – 2018-02-01 (×2): 4 mg via ORAL
  Filled 2018-01-31 (×2): qty 2

## 2018-01-31 MED ORDER — LIDOCAINE 5 % EX PTCH
1.0000 | MEDICATED_PATCH | CUTANEOUS | Status: DC
  Filled 2018-01-31: qty 1

## 2018-01-31 MED ORDER — KETOROLAC TROMETHAMINE 15 MG/ML IJ SOLN: 15.0000 mg | Freq: Once | INTRAMUSCULAR | Status: DC

## 2018-01-31 NOTE — Progress Notes (Signed)
  Echocardiogram 2D Echocardiogram has been performed.  Haley [name] 01/31/2018, 3:31 PM

## 2018-01-31 NOTE — Progress Notes (Signed)
PROGRESS NOTE  Haley [name] YKD:[id number] DOB: 02/03/1945 DOA: 01/15/2018 PCP: Martinique, Stormy G, MD  HPI/Recap of past 54 hours: 73 year old female who presented with abdominal pain, she does have significant past medical history for ovarian cancer,complicated with peritoneal carcinomatosis and malignant ascites. Patient developed abdominal pain, that was refractory to analgesics and paracentesis. She was afebrile, blood pressure 112/62, heart rate 65, respiratory rate 18, oxygen saturation 95%, she had dry mucous membranes, lungs clear to auscultation bilaterally, heart S1-S2 present and rhythmic,her abdomen was mildly distended, tender to palpation no masses palpable, no lower extremity edema. Her abdomen and pelvis CTshowed dilated fluid filled loops of small bowel with transition point likely in the right adnexa adjacent to, cystic mass.  Patient was admitted to the hospital with working diagnosis of partial small bowel obstruction.  01/31/2018: Patient seen and examined at bedside.  No acute events overnight.  Reports 1 loose stool today.  Denies abdominal pain or nausea.  Reports lower extremity pain from her fibromyalgia.  Assessment/Plan: Principal Problem:   Partial small bowel obstruction (HCC) Active Problems:   Hypothyroidism   GERD   PAF (paroxysmal atrial fibrillation) (HCC)   Carcinomatosis (HCC)   Abdominal pain   Ovarian cancer ([place]) suspected   Encounter for imaging study to confirm nasogastric (NG) tube placement  Partial small bowel obstruction secondary to locally advanced cancer with carcinomatosis, suspicious for ovarian cancer Medical oncology is following Completed 1 cycle of chemotherapy on January 18, 2018 Continue supportive care as recommended by oncology Next dose of chemotherapy planned for February 08, 2018 Nausea vomiting and diarrhea have resolved Tolerating a solid diet 1 loose stool reported today Bowel sounds noted on exam  Chronic leg pain  from fibromyalgia Resume gabapentin  Leukocytosis WBC is trending down from 19 K to 13 K this morning Afebrile Suspect reactive No sign of active infective process Repeat CBC in the morning  Paroxysmal A. Fib Recently noted to be in rapid ventricular response Continue p.o. Cardizem 30 mg every 6 hours Continue Toprol-XL 50 mg daily Continue to monitor on telemetry Continue Xarelto for primary CVA prevention 2D echo is pending  Essential hypertension Blood pressure stable Continue cardiac medications as stated above  Hypothyroidism Continue home medications  Resolved hypokalemia post repletion Repeat BMP in the morning  Anemia of chronic disease postchemotherapy No sign of overt bleeding MCV 90.1 Hemoglobin stable at 10.5 Repeat CBC in the morning   DVT prophylaxis: SCD's Code Status: Full Family Communication: Pt in room, family is at bedside Disposition Plan: Uncertain at this time  Consultants:   Oncology  Gyn Onc  Procedures:   None   Objective: Vitals:   01/30/18 0359 01/30/18 1420 01/30/18 2104 01/31/18 0535  BP: 103/66 (!) 100/57 135/79 (!) 110/54  Pulse: 82 61 70   Resp: 16 14 18    Temp: 98.4 F (36.9 C) 98.2 F (36.8 C) 99 F (37.2 C)   TempSrc: Oral Oral Oral   SpO2: 92% 94% 97%   Weight:      Height:        Intake/Output Summary (Last 24 hours) at 01/31/2018 1326 Last data filed at 01/31/2018 0600 Gross per 24 hour  Intake 1740 ml  Output -  Net 1740 ml   Filed Weights   01/24/18 2000  Weight: 65.8 kg    Exam:  . General: 73 y.o. year-old female well developed well nourished in no acute distress.  Alert and oriented x3. . Cardiovascular: Regular rate and rhythm with  no rubs or gallops.  No thyromegaly or JVD noted.   Marland Kitchen Respiratory: Clear to auscultation with no wheezes or rales. Good inspiratory effort. . Abdomen: Soft nontender nondistended with normal bowel sounds x4 quadrants. . Musculoskeletal: No lower extremity  edema. 2/4 pulses in all 4 extremities.  Marland Kitchen Psychiatry: Mood is appropriate for condition and setting   Data Reviewed: CBC: Recent Labs  Lab 01/26/18 0302 01/27/18 0400 01/28/18 0356 01/29/18 0438 01/31/18 0406  WBC 3.0* 7.3 19.3* 19.9* 13.3*  NEUTROABS 1.2* 3.4 14.3* 16.9* 8.6*  HGB 9.6* 10.0* 9.7* 9.6* 10.5*  HCT 30.7* 31.8* 30.8* 30.4* 33.6*  MCV 90.3 88.6 90.3 88.6 90.1  PLT 231 225 251 246 960   Basic Metabolic Panel: Recent Labs  Lab 01/27/18 0400 01/28/18 0356 01/29/18 0438 01/30/18 0530 01/31/18 0406  NA 131* 135 137 137 137  K 3.0* 3.2* 3.0* 3.4* 3.8  CL 98 101 104 107 107  CO2 25 24 25 25 24   GLUCOSE 92 73 92 105* 102*  BUN 7* <5* <5* 5* 9  CREATININE 0.54 0.56 0.58 0.63 0.62  CALCIUM 8.0* 8.0* 7.8* 7.8* 8.4*  MG  --  1.8 1.8  --   --    GFR: Estimated Creatinine Clearance: 55.8 mL/min (by C-G formula based on SCr of 0.62 mg/dL). Liver Function Tests: Recent Labs  Lab 01/27/18 0400  AST 15  ALT 14  ALKPHOS 79  BILITOT 0.5  PROT 5.4*  ALBUMIN 2.3*   No results for input(s): LIPASE, AMYLASE in the last 168 hours. No results for input(s): AMMONIA in the last 168 hours. Coagulation Profile: No results for input(s): INR, PROTIME in the last 168 hours. Cardiac Enzymes: No results for input(s): CKTOTAL, CKMB, CKMBINDEX, TROPONINI in the last 168 hours. BNP (last 3 results) No results for input(s): PROBNP in the last 8760 hours. HbA1C: No results for input(s): HGBA1C in the last 72 hours. CBG: No results for input(s): GLUCAP in the last 168 hours. Lipid Profile: No results for input(s): CHOL, HDL, LDLCALC, TRIG, CHOLHDL, LDLDIRECT in the last 72 hours. Thyroid Function Tests: No results for input(s): TSH, T4TOTAL, FREET4, T3FREE, THYROIDAB in the last 72 hours. Anemia Panel: No results for input(s): VITAMINB12, FOLATE, FERRITIN, TIBC, IRON, RETICCTPCT in the last 72 hours. Urine analysis:    Component Value Date/Time   COLORURINE STRAW (A)  01/15/2018 1555   APPEARANCEUR CLEAR 01/15/2018 1555   LABSPEC 1.002 (L) 01/15/2018 1555   PHURINE 6.0 01/15/2018 1555   GLUCOSEU NEGATIVE 01/15/2018 1555   HGBUR SMALL (A) 01/15/2018 1555   HGBUR trace-intact 09/10/2009 0831   BILIRUBINUR NEGATIVE 01/15/2018 1555   BILIRUBINUR neg 12/13/2017 1029   KETONESUR NEGATIVE 01/15/2018 1555   PROTEINUR NEGATIVE 01/15/2018 1555   UROBILINOGEN 0.2 12/13/2017 1029   UROBILINOGEN 0.2 09/10/2009 0831   NITRITE NEGATIVE 01/15/2018 1555   LEUKOCYTESUR NEGATIVE 01/15/2018 1555   Sepsis Labs: @LABRCNTIP (procalcitonin:4,lacticidven:4)  )No results found for this or any previous visit (from the past 240 hour(s)).    Studies: No results found.  Scheduled Meds: . diltiazem  30 mg Oral Q6H  . docusate sodium  100 mg Oral BID  . famotidine  40 mg Oral Daily  . feeding supplement  1 Container Oral Q24H  . fluticasone  2 spray Each Nare Daily  . hydrocortisone   Rectal BID  . Influenza vac split quadrivalent PF  0.5 mL Intramuscular Tomorrow-1000  . ketorolac  15 mg Intravenous Once  . lactose free nutrition  237 mL Oral  Q24H  . levothyroxine  75 mcg Oral Q0600  . lidocaine  1 patch Transdermal Q24H  . metoCLOPramide  5 mg Oral TID AC  . metoprolol succinate  50 mg Oral Daily  . pneumococcal 23 valent vaccine  0.5 mL Intramuscular Tomorrow-1000  . polyethylene glycol  17 g Oral Daily  . rivaroxaban  20 mg Oral Q supper  . senna  1 tablet Oral BID    Continuous Infusions: . sodium chloride 50 mL/hr at 01/31/18 0600  . sodium chloride    . sodium chloride    . famotidine (PEPCID) IV       LOS: 15 days     Kayleen Memos, MD [facility] Pager [phone]  If 7PM-7AM, please contact night-coverage [email or web address] Password [email or web address] 01/31/2018, 1:26 PM

## 2018-01-31 NOTE — Care Management Note (Signed)
Case Management Note  Patient Details  Name: Haley [name] MRN: [id number] Date of Birth: Feb 11, 1945  Subjective/Objective:                    Action/Plan:[facility] will follow pt at home with HHPT/OT/NA.    Expected Discharge Date:                  Expected Discharge Plan:  [place]  In-House Referral:     Discharge planning Services  CM Consult  Post Acute Care Choice:    Choice offered to:  Patient  DME Arranged:    DME Agency:     HH Arranged:  PT, OT, Nurse's Aide [place] Agency:  [facility]  Status of Service:  In process, will continue to follow  If discussed at Long Length of Stay Meetings, dates discussed:    Additional CommentsPurcell Mouton, RN 01/31/2018, 3:45 PM

## 2018-01-31 NOTE — Progress Notes (Signed)
[place]   DOB:08-Apr-1945   HA#:[id number]    Assessment & Plan:  Locally advanced cancer with carcinomatosis, suspicious for ovarian cancer She tolerated cycle 1 day 1 of carboplatin and Taxol on January 18, 2018. Continue supportive care Her next dose of chemotherapy hopefully will be in the outpatient setting around February 08, 2018.  Nausea, vomiting, bloating and constipation, resolved Her symptoms are most consistent with carcinomatosis. They are resolving. NG tube has been removed. She will continue conservative management and advance diet as tolerated.  I am ordering oral pain medicine for her and recommend she takes oral before IV in an attempt to get her out of the hospital.  She tolerated Reglan very well and will continue to schedule with 3 times a day to help with post-prandial bloating  Malignant ascites status post therapeutic paracentesis on January 15, 2018 She underwent repeat paracentesis on January 23, 2018. She felt better   Severe protein calorie malnutrition We will start her on clear liquids with plan to advance her diet slowly as tolerated over the next few days  Mild leukopenia, resolved She was startedon daily G-CSF support on 01/24/2018,until ANC is greater than 1.5  Cough with recent mild hemoptysis Chest x-ray showed no evidence of pneumonia.  She has no further bleeding complication.  She will continue Xarelto  Paroxysmal atrial fibrillation She is on telemetry unit along with beta-blocker I will defer to primary service There is no contraindication for her to be on anticoagulation therapyifindicated, as long as platelet count is >50,000  Discharge planning Hopefully the next few days if we can advance her diet back to full diet without problems I will schedule outpatient follow-up for next week in anticipation she will be discharged by the end of the week.  Heath Lark, MD 01/31/2018  7:06 AM   Subjective:  She continues to have mild  cough but nonproductive in general.  She has persistent abdominal pain and chronic leg pain from fibromyalgia. I have increased her pain medicine 2 days ago and she found that helpful. She continues to have spontaneous bowel movement and less abdominal distention.  She denies bleeding.  Objective:  Vitals:   01/30/18 2104 01/31/18 0535  BP: 135/79 (!) 110/54  Pulse: 70   Resp: 18   Temp: 99 F (37.2 C)   SpO2: 97%      Intake/Output Summary (Last 24 hours) at 01/31/2018 0706 Last data filed at 01/31/2018 0600 Gross per 24 hour  Intake 1740 ml  Output -  Net 1740 ml    GENERAL:alert, no distress and comfortable SKIN: skin color, texture, turgor are normal, no rashes or significant lesions EYES: normal, Conjunctiva are pink and non-injected, sclera clear OROPHARYNX:no exudate, no erythema and lips, buccal mucosa, and tongue normal  NECK: supple, thyroid normal size, non-tender, without nodularity LYMPH:  no palpable lymphadenopathy in the cervical, axillary or inguinal LUNGS: clear to auscultation and percussion with normal breathing effort HEART: irregular rate & rhythm and no murmurs and no lower extremity edema ABDOMEN:abdomen soft, non-tender and normal bowel sounds. Less distension Musculoskeletal:no cyanosis of digits and no clubbing  NEURO: alert & oriented x 3 with fluent speech, no focal motor/sensory deficits   Labs:  Lab Results  Component Value Date   WBC 13.3 (H) 01/31/2018   HGB 10.5 (L) 01/31/2018   HCT 33.6 (L) 01/31/2018   MCV 90.1 01/31/2018   PLT 275 01/31/2018   NEUTROABS 8.6 (H) 01/31/2018    Lab Results  Component Value  Date   NA 137 01/31/2018   K 3.8 01/31/2018   CL 107 01/31/2018   CO2 24 01/31/2018    Studies:  Dg Chest 2 View  Result Date: 01/30/2018 CLINICAL DATA:  Cough and congestion EXAM: CHEST - 2 VIEW COMPARISON:  01/15/2018 FINDINGS: Cardiac shadows within normal limits. New right chest wall port is noted in satisfactory position.  The lungs are well aerated bilaterally. Minimal scarring is noted in the re- left lung base stable from the recent CT. No focal confluent infiltrate is seen. Minimal left pleural effusion is noted. IMPRESSION: New minimal left pleural effusion. No other acute abnormality is noted. Electronically Signed   By: Inez Catalina M.D.   On: 01/30/2018 09:26

## 2018-02-01 ENCOUNTER — Telehealth: Payer: Self-pay | Admitting: Hematology and Oncology

## 2018-02-01 ENCOUNTER — Encounter: Payer: Self-pay | Admitting: *Deleted

## 2018-02-01 LAB — BASIC METABOLIC PANEL
Anion gap: 8 (ref 5–15)
BUN: 11 mg/dL (ref 8–23)
CO2: 25 mmol/L (ref 22–32)
Calcium: 8 mg/dL — ABNORMAL LOW (ref 8.9–10.3)
Chloride: 101 mmol/L (ref 98–111)
Creatinine, Ser: 0.59 mg/dL (ref 0.44–1.00)
GFR calc non Af Amer: >60 mL/min (ref >60)
Glucose, Bld: 90 mg/dL (ref 70–99)
Potassium: 3.1 mmol/L — ABNORMAL LOW (ref 3.5–5.1)
Sodium: 134 mmol/L — ABNORMAL LOW (ref 135–145)

## 2018-02-01 LAB — CBC
HCT: 31 % — ABNORMAL LOW (ref 36.0–46.0)
Hemoglobin: 9.7 g/dL — ABNORMAL LOW (ref 12.0–15.0)
MCH: 28.3 pg (ref 26.0–34.0)
MCHC: 31.3 g/dL (ref 30.0–36.0)
MCV: 90.4 fL (ref 80.0–100.0)
Platelets: 227 10*3/uL (ref 150–400)
RBC: 3.43 MIL/uL — ABNORMAL LOW (ref 3.87–5.11)
RDW: 14 % (ref 11.5–15.5)
WBC: 10.7 10*3/uL — ABNORMAL HIGH (ref 4.0–10.5)
nRBC: 0.5 % — ABNORMAL HIGH (ref 0.0–0.2)

## 2018-02-01 MED ORDER — POTASSIUM CHLORIDE CRYS ER 20 MEQ PO TBCR
40.0000 meq | EXTENDED_RELEASE_TABLET | Freq: Two times a day (BID) | ORAL | Status: AC
  Administered 2018-02-01 (×2): 40 meq via ORAL
  Filled 2018-02-01 (×2): qty 2

## 2018-02-01 NOTE — Progress Notes (Signed)
PROGRESS NOTE  Haley [name] ALP:[id number] DOB: Sep 20, 1945 DOA: 01/15/2018 PCP: Martinique, Donne G, MD  HPI/Recap of past 2 hours: 73 year old female who presented with abdominal pain, she does have significant past medical history for ovarian cancer,complicated with peritoneal carcinomatosis and malignant ascites. Patient developed abdominal pain, that was refractory to analgesics and paracentesis. She was afebrile, blood pressure 112/62, heart rate 65, respiratory rate 18, oxygen saturation 95%, she had dry mucous membranes, lungs clear to auscultation bilaterally, heart S1-S2 present and rhythmic,her abdomen was mildly distended, tender to palpation no masses palpable, no lower extremity edema. Her abdomen and pelvis CTshowed dilated fluid filled loops of small bowel with transition point likely in the right adnexa adjacent to, cystic mass.  Patient was admitted to the hospital with working diagnosis of partial small bowel obstruction.  01/31/2018: Patient seen and examined at bedside.  No acute events overnight.  Reports 1 loose stool today.  Denies abdominal pain or nausea.  Reports lower extremity pain from her fibromyalgia.  02/01/2018: Patient examined at bedside.  No acute events overnight.  Tolerating a diet well.  Strength gradually improving.  Reports bilateral lower extremity tenderness, she does not think it is from fibromyalgia.  Will r/o DVT. PT recommended home health PT.  Possible discharge tomorrow 02/02/2018  Assessment/Plan: Principal Problem:   Partial small bowel obstruction (HCC) Active Problems:   Hypothyroidism   GERD   PAF (paroxysmal atrial fibrillation) (HCC)   Carcinomatosis (HCC)   Abdominal pain   Ovarian cancer ([place]) suspected   Encounter for imaging study to confirm nasogastric (NG) tube placement  Partial small bowel obstruction secondary to locally advanced cancer with carcinomatosis, suspicious for ovarian cancer Medical oncology is  following Completed 1 cycle of chemotherapy on January 18, 2018 Continue supportive care as recommended by oncology Next dose of chemotherapy planned for February 08, 2018 Nausea vomiting and diarrhea have resolved Tolerating a solid diet 1 loose stool reported today Bowel sounds noted on exam  Hypokalemia Potassium 3.1 Repleted with p.o. potassium 40 mEq x 2 doses Repeat BMP in the morning  Leukocytosis, resolved  Chronic bilateral leg pain, unclear etiology Patient is convinced this is not from fibromyalgia Obtain bilateral lower extremity duplex ultrasound to rule out DVT Continue gabapentin  Paroxysmal A. Fib Recently noted to be in rapid ventricular response Continue p.o. Cardizem 30 mg every 6 hours Continue Toprol-XL 50 mg daily Continue to monitor on telemetry Continue Xarelto for primary CVA prevention 2D echo with normal LVEF with no wall motion abnormalities  Essential hypertension Blood pressure stable Continue cardiac medications as stated above  Hypothyroidism Continue home medications  Anemia of chronic disease postchemotherapy No sign of overt bleeding MCV 90.1 Hemoglobin stable at 10.5 Repeat CBC in the morning   DVT prophylaxis: SCD's Code Status: Full Family Communication: Pt in room, family is at bedside Disposition Plan:  Charge to home with home health PT tomorrow 02/02/2018  Consultants:   Oncology  Gyn Onc  Procedures:   None   Objective: Vitals:   01/31/18 2023 02/01/18 0620 02/01/18 1007 02/01/18 1231  BP: (!) 142/82 111/67 115/64 128/77  Pulse: 78 72 70 (!) 58  Resp: 18 18  16   Temp: 98.7 F (37.1 C) 98 F (36.7 C)  97.8 F (36.6 C)  TempSrc: Oral Oral  Oral  SpO2: 96% 95%  96%  Weight:      Height:        Intake/Output Summary (Last 24 hours) at 02/01/2018 1720 Last data  filed at 02/01/2018 1444 Gross per 24 hour  Intake 240 ml  Output -  Net 240 ml   Filed Weights   01/24/18 2000  Weight: 65.8 kg     Exam:  . General: 73 y.o. year-old female well-developed well-nourished in no acute distress.  Alert and oriented x3 . Cardiovascular: Regular rate and rhythm with no rubs or gallops.  No JVD or thyromegaly noted.  Respiratory: Clear to auscultation with no wheezes or rales.  Good density effort.   . Abdomen: Soft nontender nondistended with normal bowel sounds x4 quadrants. . Musculoskeletal: No lower extremity edema. 2/4 pulses in all 4 extremities.  Marland Kitchen Psychiatry: Mood is appropriate for condition and setting   Data Reviewed: CBC: Recent Labs  Lab 01/26/18 0302 01/27/18 0400 01/28/18 0356 01/29/18 0438 01/31/18 0406 02/01/18 0408  WBC 3.0* 7.3 19.3* 19.9* 13.3* 10.7*  NEUTROABS 1.2* 3.4 14.3* 16.9* 8.6*  --   HGB 9.6* 10.0* 9.7* 9.6* 10.5* 9.7*  HCT 30.7* 31.8* 30.8* 30.4* 33.6* 31.0*  MCV 90.3 88.6 90.3 88.6 90.1 90.4  PLT 231 225 251 246 275 254   Basic Metabolic Panel: Recent Labs  Lab 01/28/18 0356 01/29/18 0438 01/30/18 0530 01/31/18 0406 02/01/18 0408  NA 135 137 137 137 134*  K 3.2* 3.0* 3.4* 3.8 3.1*  CL 101 104 107 107 101  CO2 24 25 25 24 25   GLUCOSE 73 92 105* 102* 90  BUN <5* <5* 5* 9 11  CREATININE 0.56 0.58 0.63 0.62 0.59  CALCIUM 8.0* 7.8* 7.8* 8.4* 8.0*  MG 1.8 1.8  --   --   --    GFR: Estimated Creatinine Clearance: 55.8 mL/min (by C-G formula based on SCr of 0.59 mg/dL). Liver Function Tests: Recent Labs  Lab 01/27/18 0400  AST 15  ALT 14  ALKPHOS 79  BILITOT 0.5  PROT 5.4*  ALBUMIN 2.3*   No results for input(s): LIPASE, AMYLASE in the last 168 hours. No results for input(s): AMMONIA in the last 168 hours. Coagulation Profile: No results for input(s): INR, PROTIME in the last 168 hours. Cardiac Enzymes: No results for input(s): CKTOTAL, CKMB, CKMBINDEX, TROPONINI in the last 168 hours. BNP (last 3 results) No results for input(s): PROBNP in the last 8760 hours. HbA1C: No results for input(s): HGBA1C in the last 72  hours. CBG: No results for input(s): GLUCAP in the last 168 hours. Lipid Profile: No results for input(s): CHOL, HDL, LDLCALC, TRIG, CHOLHDL, LDLDIRECT in the last 72 hours. Thyroid Function Tests: No results for input(s): TSH, T4TOTAL, FREET4, T3FREE, THYROIDAB in the last 72 hours. Anemia Panel: No results for input(s): VITAMINB12, FOLATE, FERRITIN, TIBC, IRON, RETICCTPCT in the last 72 hours. Urine analysis:    Component Value Date/Time   COLORURINE STRAW (A) 01/15/2018 1555   APPEARANCEUR CLEAR 01/15/2018 1555   LABSPEC 1.002 (L) 01/15/2018 1555   PHURINE 6.0 01/15/2018 1555   GLUCOSEU NEGATIVE 01/15/2018 1555   HGBUR SMALL (A) 01/15/2018 1555   HGBUR trace-intact 09/10/2009 0831   BILIRUBINUR NEGATIVE 01/15/2018 1555   BILIRUBINUR neg 12/13/2017 1029   KETONESUR NEGATIVE 01/15/2018 1555   PROTEINUR NEGATIVE 01/15/2018 1555   UROBILINOGEN 0.2 12/13/2017 1029   UROBILINOGEN 0.2 09/10/2009 0831   NITRITE NEGATIVE 01/15/2018 1555   LEUKOCYTESUR NEGATIVE 01/15/2018 1555   Sepsis Labs: @LABRCNTIP (procalcitonin:4,lacticidven:4)  )No results found for this or any previous visit (from the past 240 hour(s)).    Studies: No results found.  Scheduled Meds: . diltiazem  30 mg Oral Q6H  .  docusate sodium  100 mg Oral BID  . famotidine  40 mg Oral Daily  . feeding supplement  1 Container Oral Q24H  . fluticasone  2 spray Each Nare Daily  . hydrocortisone   Rectal BID  . Influenza vac split quadrivalent PF  0.5 mL Intramuscular Tomorrow-1000  . lactose free nutrition  237 mL Oral Q24H  . levothyroxine  75 mcg Oral Q0600  . metoCLOPramide  5 mg Oral TID AC  . metoprolol succinate  50 mg Oral Daily  . pneumococcal 23 valent vaccine  0.5 mL Intramuscular Tomorrow-1000  . polyethylene glycol  17 g Oral Daily  . potassium chloride  40 mEq Oral BID  . rivaroxaban  20 mg Oral Q supper  . senna  1 tablet Oral BID    Continuous Infusions: . sodium chloride 50 mL/hr at 02/01/18  0056  . sodium chloride    . sodium chloride    . famotidine (PEPCID) IV       LOS: 16 days     Kayleen Memos, MD [facility] Pager [phone]  If 7PM-7AM, please contact night-coverage [email or web address] Password [email or web address] 02/01/2018, 5:20 PM

## 2018-02-01 NOTE — Consult Note (Signed)
   [name] CM Inpatient Consult   02/01/2018  Haley [name] 1945-04-05 [id number]    Patient screened for potential [name] Care Management services due to unplanned readmission risk score of 27% (high) and inpatient [facility] requested writer follow up for services.  Went to bedside to speak with Haley [name] and husband about [facility] Management program services. Haley [name] is agreeable and [name] Care Management written consent obtained and [name] In[facility] folder provided.   Explained [name] Care Management will not interfere or replace services provided by home health.   Confirmed Primary Care Provider is Dr. Martinique (Velora Heckler at [facility] is listed as doing transition of care call).   Best contact number for Haley [name] is [phone]. Patient's husband, Haley [name],  listed as emergency contact at [phone].  Haley [name] is agreeable to [place] follow up for complex case management and [facility] Pharmacist for medication review. Haley [name] states she will likely have some medication changes and will likely have new meds added.   Will continue to follow and make above [facility] Care Management referrals upon discharge.   Marthenia Rolling, MSN-Ed, RN,BSN [name] Liaison [phone]

## 2018-02-01 NOTE — Evaluation (Signed)
Physical Therapy Evaluation Patient Details Name: Haley [name] MRN: [id number] DOB: Apr 30, 1945 Today's Date: 02/01/2018   History of Present Illness  73 yo female admitted to ED on 1/6 for SBO/ileus. Pt with NG tube use, d/ced. PMH includes cataracts, fibromyalgia, GERD, HLD, MVP, OA, osteoporosis, PAF, sleep apnea, recent diagnosis of ovarian cancer with carcinomatosis.   Clinical Impression   Pt presents with RLE pain, nausea, increased time and effort to perform mobility tasks, unsteadiness with ambulation, and LE generalized weakness. Pt to benefit from acute PT to address deficits. Pt ambulated 60 ft with IV pole and environment for steadying. PT recommending HHPT to increase safety and independence with mobility at d/c. PT to progress mobility as tolerated, and will continue to follow acutely.      Follow Up Recommendations Home health PT;Supervision for mobility/OOB    Equipment Recommendations  None recommended by PT    Recommendations for Other Services       Precautions / Restrictions Precautions Precautions: Fall Restrictions Weight Bearing Restrictions: No      Mobility  Bed Mobility Overal bed mobility: Needs Assistance Bed Mobility: Supine to Sit;Sit to Supine     Supine to sit: Supervision Sit to supine: Supervision   General bed mobility comments: Supervision for safety. Increased time and use of bedrails for exiting/entering bed.   Transfers Overall transfer level: Needs assistance Equipment used: None Transfers: Sit to/from Stand Sit to Stand: Supervision         General transfer comment: supervision for safety. Increased time to come to standing, self-steadying upon standing.   Ambulation/Gait Ambulation/Gait assistance: Min Gaffer (Feet): 720 Feet Assistive device: None;IV Pole Gait Pattern/deviations: Step-through pattern;Decreased stride length Gait velocity: slightly decr    General Gait Details: Min guard to  supervision for safety. Pt with intermittent use of IV pole and environment for steadying, as pt reports "I feel a little wobbly/woozy". Pt with moderate gait speed, unable to accept challenge and slowed with hall obstacles.   Stairs Stairs: Yes Stairs assistance: Min guard Stair Management: Two rails;Step to pattern;Alternating pattern;Forwards Number of Stairs: 4 General stair comments: Pt with step-to pattern with ascending and alternating pattern with descending, use of both rails and increased time.   Wheelchair Mobility    Modified Rankin (Stroke Patients Only)       Balance Overall balance assessment: Needs assistance Sitting-balance support: No upper extremity supported Sitting balance-Leahy Scale: Good       Standing balance-Leahy Scale: Fair Standing balance comment: able to stand and brush teeth without assist, cannot tolerate challenge to dynamic standing balance.                              Pertinent Vitals/Pain Pain Assessment: 0-10 Pain Score: 3  Pain Location: nausea, R leg  Pain Descriptors / Indicators: Sore Pain Intervention(s): Limited activity within patient's tolerance;Repositioned;Monitored during session    Home Living Family/patient expects to be discharged to:: Private residence Living Arrangements: Spouse/significant other Available Help at Discharge: Family;Available 24 hours/day Type of Home: House Home Access: Level entry     Home Layout: Multi-level Home Equipment: Tub bench      Prior Function Level of Independence: Independent         Comments: Pt reports never having any need for equipment for ambulation, was independent PTA      Hand Dominance   Dominant Hand: Right    Extremity/Trunk Assessment   Upper Extremity Assessment  Upper Extremity Assessment: Overall WFL for tasks assessed    Lower Extremity Assessment Lower Extremity Assessment: Generalized weakness    Cervical / Trunk Assessment Cervical /  Trunk Assessment: Normal  Communication   Communication: No difficulties  Cognition Arousal/Alertness: Awake/alert Behavior During Therapy: WFL for tasks assessed/performed Overall Cognitive Status: Within Functional Limits for tasks assessed                                        General Comments      Exercises     Assessment/Plan    PT Assessment Patient needs continued PT services  PT Problem List Decreased strength;Pain;Decreased activity tolerance;Decreased balance;Decreased safety awareness;Decreased mobility       PT Treatment Interventions DME instruction;Therapeutic activities;Gait training;Therapeutic exercise;Patient/family education;Balance training;Stair training;Functional mobility training    PT Goals (Current goals can be found in the Care Plan section)  Acute Rehab PT Goals Patient Stated Goal: be less wobbly  PT Goal Formulation: With patient Time For Goal Achievement: 02/15/18 Potential to Achieve Goals: Good    Frequency Min 3X/week   Barriers to discharge        Co-evaluation               AM-PAC PT "6 Clicks" Mobility  Outcome Measure Help needed turning from your back to your side while in a flat bed without using bedrails?: None Help needed moving from lying on your back to sitting on the side of a flat bed without using bedrails?: A Little Help needed moving to and from a bed to a chair (including a wheelchair)?: A Little Help needed standing up from a chair using your arms (e.g., wheelchair or bedside chair)?: A Little Help needed to walk in hospital room?: A Little Help needed climbing 3-5 steps with a railing? : A Little 6 Click Score: 19    End of Session   Activity Tolerance: Patient tolerated treatment well;Patient limited by fatigue Patient left: in bed;with bed alarm set;with call bell/phone within reach;with family/visitor present Nurse Communication: Mobility status PT Visit Diagnosis: Other abnormalities  of gait and mobility (R26.89);Difficulty in walking, not elsewhere classified (R26.2)    Time: 1135-1207 PT Time Calculation (min) (ACUTE ONLY): 32 min   Charges:   PT Evaluation $PT Eval Low Complexity: 1 Low PT Treatments $Gait Training: 8-22 mins       Julien Girt, PT Acute Rehabilitation Services Pager [phone]  Office [phone]   Roxine Caddy D Elonda Husky 02/01/2018, 12:27 PM

## 2018-02-01 NOTE — Telephone Encounter (Signed)
Called patient - unable to reach - left message with appt date and time

## 2018-02-01 NOTE — Care Management Important Message (Signed)
Important Message  Patient Details  Name: Haley [name] MRN: [id number] Date of Birth: [birth year]   Medicare Important Message Given:  Yes    Kerin Salen 02/01/2018, 11:35 AMImportant Message  Patient Details  Name: Haley [name] MRN: [id number] Date of Birth: [birth year]   Medicare Important Message Given:  Yes    Kerin Salen 02/01/2018, 11:34 AM

## 2018-02-02 ENCOUNTER — Inpatient Hospital Stay (HOSPITAL_COMMUNITY): Payer: PPO

## 2018-02-02 DIAGNOSIS — R52 Pain, unspecified: Secondary | ICD-10-CM

## 2018-02-02 DIAGNOSIS — M7989 Other specified soft tissue disorders: Secondary | ICD-10-CM

## 2018-02-02 LAB — BASIC METABOLIC PANEL
Anion gap: 6 (ref 5–15)
BUN: 15 mg/dL (ref 8–23)
CHLORIDE: 102 mmol/L (ref 98–111)
CO2: 27 mmol/L (ref 22–32)
Calcium: 8.5 mg/dL — ABNORMAL LOW (ref 8.9–10.3)
Creatinine, Ser: 0.65 mg/dL (ref 0.44–1.00)
GFR calc Af Amer: >60 mL/min (ref >60)
GFR calc non Af Amer: >60 mL/min (ref >60)
Glucose, Bld: 95 mg/dL (ref 70–99)
Potassium: 4.1 mmol/L (ref 3.5–5.1)
Sodium: 135 mmol/L (ref 135–145)

## 2018-02-02 LAB — CBC
HCT: 30.5 % — ABNORMAL LOW (ref 36.0–46.0)
HEMOGLOBIN: 9.5 g/dL — AB (ref 12.0–15.0)
MCH: 28.3 pg (ref 26.0–34.0)
MCHC: 31.1 g/dL (ref 30.0–36.0)
MCV: 90.8 fL (ref 80.0–100.0)
Platelets: 228 10*3/uL (ref 150–400)
RBC: 3.36 MIL/uL — ABNORMAL LOW (ref 3.87–5.11)
RDW: 14 % (ref 11.5–15.5)
WBC: 8 10*3/uL (ref 4.0–10.5)
nRBC: 0.2 % (ref 0.0–0.2)

## 2018-02-02 MED ORDER — POLYETHYLENE GLYCOL 3350 17 G PO PACK: 17.0000 g | PACK | Freq: Every day | ORAL | 0 refills | Status: AC

## 2018-02-02 MED ORDER — HEPARIN SOD (PORK) LOCK FLUSH 100 UNIT/ML IV SOLN
500.0000 [IU] | INTRAVENOUS | Status: AC | PRN
  Administered 2018-02-02: 500 [IU]

## 2018-02-02 MED ORDER — EPINEPHRINE 0.3 MG/0.3ML IJ SOAJ: 0.3000 mg | INTRAMUSCULAR | 0 refills | Status: DC | PRN

## 2018-02-02 MED ORDER — FAMOTIDINE 40 MG PO TABS: 40.0000 mg | ORAL_TABLET | Freq: Every day | ORAL | 0 refills | Status: DC

## 2018-02-02 MED ORDER — GABAPENTIN 100 MG PO CAPS: 200.0000 mg | ORAL_CAPSULE | Freq: Every day | ORAL | 0 refills | Status: DC | PRN

## 2018-02-02 MED ORDER — RIVAROXABAN 20 MG PO TABS: 20.0000 mg | ORAL_TABLET | Freq: Every day | ORAL | 0 refills | Status: DC

## 2018-02-02 MED ORDER — BOOST PLUS PO LIQD: 237.0000 mL | ORAL | 0 refills | Status: AC

## 2018-02-02 MED ORDER — DILTIAZEM HCL 30 MG PO TABS: 30.0000 mg | ORAL_TABLET | Freq: Four times a day (QID) | ORAL | 0 refills | Status: DC

## 2018-02-02 MED ORDER — METOPROLOL SUCCINATE ER 50 MG PO TB24: 50.0000 mg | ORAL_TABLET | Freq: Every day | ORAL | 0 refills | Status: DC

## 2018-02-02 NOTE — Care Management Note (Signed)
Case Management Note  Patient Details  Name: Haley [name] MRN: [id number] Date of Birth: [birth year]  Subjective/Objective:  D/c home today w/[facility]-rep Cory aware of [place] orders, & d/c. No further CM needs.                  Action/Plan:dc home w/HHC.   Expected Discharge Date:  02/02/18               Expected Discharge Plan:  [place]  In-House Referral:     Discharge planning Services  CM Consult  Post Acute Care Choice:    Choice offered to:  Patient  DME Arranged:    DME Agency:     HH Arranged:  PT, OT, Nurse's Aide [place] Agency:  [place]  Status of Service:  Completed, signed off  If discussed at [place] of Stay Meetings, dates discussed:    Additional Comments:  Dessa Phi, RN 02/02/2018, 12:19 PM

## 2018-02-02 NOTE — Progress Notes (Signed)
Bilateral lower extremities venous duplex exam completed. Please see preliminary notes on CV PROC under chart review. Wilman Tucker H Bernece Gall(RDMS RVT) 02/02/18 10:15 AM

## 2018-02-02 NOTE — Discharge Summary (Addendum)
Discharge Summary  Haley [name]:[id number] DOB: [birth year]  PCP: Martinique, Lakota G, MD  Admit date: 01/15/2018 Discharge date: 02/02/2018  Time spent: 35 minutes  Recommendations for Outpatient Follow-up:  1. Follow-up with your oncologist within a week 2. Follow-up with your PCP 3. Take your medications as prescribed 4. Continue physical therapy 5. Fall precautions  Discharge Diagnoses:  Active Hospital Problems   Diagnosis Date Noted  . Partial small bowel obstruction ([place]) 01/16/2018  . Encounter for imaging study to confirm nasogastric (NG) tube placement   . Ovarian cancer ([place]) suspected 01/16/2018  . Abdominal pain 01/15/2018  . Carcinomatosis ([place]) 01/11/2018  . PAF (paroxysmal atrial fibrillation) ([place]) 03/31/2013  . GERD 06/03/2008  . Hypothyroidism 07/07/2006    Resolved Hospital Problems  No resolved problems to display.    Discharge Condition: Stable  Diet recommendation: Resume previous diet  Vitals:   02/02/18 0518 02/02/18 1135  BP: (!) 110/58 129/72  Pulse: 72   Resp: 16   Temp: 98.5 F (36.9 C)   SpO2: 95%     History of present illness:  73 year old female who presented with abdominal pain, she does have significant past medical history for ovarian cancer,complicated with peritoneal carcinomatosis and malignant ascites. Patient developed abdominal pain, that was refractory to analgesics and paracentesis. She was afebrile, blood pressure 112/62, heart rate 65, respiratory rate 18, oxygen saturation 95%, she had dry mucous membranes, lungs clear to auscultation bilaterally, heart S1-S2 present and rhythmic,her abdomen was mildly distended, tender to palpation no masses palpable, no lower extremity edema. Her abdomen and pelvis CTshowed dilated fluid filled loops of small bowel with transition point likely in the right adnexa adjacent to, cystic mass.  Patient was admitted to the hospital with working diagnosis of partial small bowel  obstruction.  Hospital course complicated by bilateral lower extremity pain suspect from fibromyalgia.  Bilateral lower extremity duplex ultrasound negative for DVT.  No warmth or erythema to suggest cellulitis.  Patient had regular bowel movements and tolerated a solid diet well without nausea, vomiting, or abdominal pain.  Positive bowel sounds on exam.  PT assessed and recommended home health PT.  02/02/2018: Patient seen and examined at bedside.  No acute events overnight.  Vital signs stable.  Lab studies unremarkable.  On the day of discharge, the patient was hemodynamically stable.  She will need to follow-up with her oncologist and PCP post hospitalization.   Hospital Course:  Principal Problem:   Partial small bowel obstruction (HCC) Active Problems:   Hypothyroidism   GERD   PAF (paroxysmal atrial fibrillation) (HCC)   Carcinomatosis (HCC)   Abdominal pain   Ovarian cancer ([place]) suspected   Encounter for imaging study to confirm nasogastric (NG) tube placement   Resolved partial small bowel obstruction secondary to locally advanced cancer with carcinomatosis, suspicious for ovarian cancer Follow-up with your medical oncologist outpatient Completed 1 cycle of chemotherapy on January 18, 2018 Continue supportive care as recommended by oncology Next dose of chemotherapy planned for February 08, 2018 Nausea vomiting and diarrhea have resolved Tolerating a solid diet She has bowel sounds on exam  Resolved hypokalemia post repletion  Leukocytosis, resolved  Chronic bilateral leg pain, suspect secondary to fibromyalgia Bilateral lower extremity duplex ultrasound negative for DVT Continue gabapentin  Paroxysmal A. Fib Recently noted to be in rapid ventricular response Continue p.o. Cardizem 30 mg every 6 hours Continue Toprol-XL 50 mg daily Continue to monitor on telemetry Continue Xarelto for primary CVA prevention 2D echo with normal  LVEF with no wall motion  abnormalities  Essential hypertension Blood pressure stable Continue cardiac medications as stated above  Hypothyroidism Continue home medications levothyroxine 75 mcg daily  Anemia of chronic disease postchemotherapy No sign of overt bleeding MCV 90.1 Hemoglobin stable at 10.5 Repeat CBC in the morning  Questionable allergy to tramadol Patient declines EpiPen   DVT prophylaxis:Xarelto Code Status:Full   Consultants:  Oncology  Gyn Onc  Procedures:  None    Discharge Exam: BP 129/72   Pulse 72   Temp 98.5 F (36.9 C) (Oral)   Resp 16   Ht 5\' 2"  (1.575 m)   Wt 65.8 kg   SpO2 95%   BMI 26.53 kg/m  . General: 73 y.o. year-old female well developed well nourished in no acute distress.  Alert and oriented x3. . Cardiovascular: Regular rate and rhythm with no rubs or gallops.  No thyromegaly or JVD noted.   Marland Kitchen Respiratory: Clear to auscultation with no wheezes or rales. Good inspiratory effort. . Abdomen: Soft nontender nondistended with normal bowel sounds x4 quadrants. . Musculoskeletal: No lower extremity edema. 2/4 pulses in all 4 extremities. Marland Kitchen Psychiatry: Mood is appropriate for condition and setting  Discharge Instructions You were cared for by a hospitalist during your hospital stay. If you have any questions about your discharge medications or the care you received while you were in the hospital after you are discharged, you can call the unit and asked to speak with the hospitalist on call if the hospitalist that took care of you is not available. Once you are discharged, your primary care physician will handle any further medical issues. Please note that NO REFILLS for any discharge medications will be authorized once you are discharged, as it is imperative that you return to your primary care physician (or establish a relationship with a primary care physician if you do not have one) for your aftercare needs so that they can reassess your need for  medications and monitor your lab values.   Allergies as of 02/02/2018      Reactions   Tramadol Hcl    REACTION: jittery   Tramadol Nausea And Vomiting      Medication List    STOP taking these medications   acetaminophen-codeine 300-30 MG tablet Commonly known as:  TYLENOL #3   aspirin 81 MG tablet   clobetasol cream 0.05 % Commonly known as:  TEMOVATE   losartan 25 MG tablet Commonly known as:  COZAAR   meloxicam 15 MG tablet Commonly known as:  MOBIC     TAKE these medications   ALPRAZolam 0.5 MG tablet Commonly known as:  XANAX TAKE ONE TABLET BY MOUTH ONCE DAILY AS NEEDED What changed:    how much to take  how to take this  when to take this  reasons to take this   CALTRATE 600 1500 (600 Ca) MG Tabs tablet Generic drug:  calcium carbonate Take by mouth.   cholecalciferol 1000 units tablet Commonly known as:  VITAMIN D Take 1,000 Units by mouth daily.   diltiazem 30 MG tablet Commonly known as:  CARDIZEM Take 1 tablet (30 mg total) by mouth every 6 (six) hours.   famotidine 40 MG tablet Commonly known as:  PEPCID Take 1 tablet (40 mg total) by mouth daily. Start taking on:  February 03, 2018   Fish Oil 1000 MG Caps Take 2 capsules by mouth 2 (two) times daily.   fluticasone 0.05 % cream Commonly known as:  CUTIVATE Apply  1 application topically at bedtime.   fluticasone 50 MCG/ACT nasal spray Commonly known as:  FLONASE Use 2 sprays in each nostril daily   gabapentin 100 MG capsule Commonly known as:  NEURONTIN Take 2 capsules (200 mg total) by mouth daily as needed (Pain). What changed:  how much to take   ibuprofen 200 MG tablet Commonly known as:  ADVIL,MOTRIN Take 600 mg by mouth daily as needed for moderate pain.   lactose free nutrition Liqd Take 237 mLs by mouth daily.   levothyroxine 75 MCG tablet Commonly known as:  SYNTHROID, LEVOTHROID Take 1 tablet (75 mcg total) by mouth daily.   metoprolol succinate 50 MG 24 hr  tablet Commonly known as:  TOPROL-XL Take 1 tablet (50 mg total) by mouth daily. Take with or immediately following a meal. What changed:    medication strength  how much to take  additional instructions   multivitamin with minerals tablet Take 1 tablet by mouth daily.   MUSCLE RUB 10-15 % Crea Apply 1 application topically as needed for muscle pain.   omeprazole 40 MG capsule Commonly known as:  PRILOSEC Take 1 capsule (40 mg total) by mouth daily.   ondansetron 4 MG disintegrating tablet Commonly known as:  ZOFRAN ODT Take 1 tablet (4 mg total) by mouth every 8 (eight) hours as needed for nausea or vomiting.   oxyCODONE 5 MG immediate release tablet Commonly known as:  Oxy IR/ROXICODONE Take 1 tablet (5 mg total) by mouth every 4 (four) hours as needed for severe pain.   polyethylene glycol packet Commonly known as:  MIRALAX / GLYCOLAX Take 17 g by mouth daily.   rivaroxaban 20 MG Tabs tablet Commonly known as:  XARELTO Take 1 tablet (20 mg total) by mouth daily with supper.   simvastatin 20 MG tablet Commonly known as:  ZOCOR Take 1 tablet (20 mg total) by mouth at bedtime.   vitamin C 500 MG tablet Commonly known as:  ASCORBIC ACID Take 500 mg by mouth 2 (two) times daily.      Allergies  Allergen Reactions  . Tramadol Hcl     REACTION: jittery  . Tramadol Nausea And Vomiting   Follow-up Information    Martinique, Jenicka G, MD. Call in 1 day(s).   Specialty:  Family Medicine Why:  Please call for a post hospital follow-up appointment Contact information: [address] [place] [address] [phone]        Heath Lark, MD. Call in 1 day(s).   Specialty:  Hematology and Oncology Why:  Please call for a post hospital follow-up appointment Contact information: [place] [phone] [phone]        Care, [facility] Follow up.   Specialty:  [place] Why:  [facility] physical/occupational  therapy/aide Contact information: [place] [address] [phone]            The results of significant diagnostics from this hospitalization (including imaging, microbiology, ancillary and laboratory) are listed below for reference.    Significant Diagnostic Studies: Dg Chest 2 View  Result Date: 01/30/2018 CLINICAL DATA:  Cough and congestion EXAM: CHEST - 2 VIEW COMPARISON:  01/15/2018 FINDINGS: Cardiac shadows within normal limits. New right chest wall port is noted in satisfactory position. The lungs are well aerated bilaterally. Minimal scarring is noted in the re- left lung base stable from the recent CT. No focal confluent infiltrate is seen. Minimal left pleural effusion is noted.  IMPRESSION: New minimal left pleural effusion. No other acute abnormality is noted. Electronically Signed   By: Inez Catalina M.D.   On: 01/30/2018 09:26   Dg Abd 1 View  Result Date: 01/23/2018 CLINICAL DATA:  Nausea, vomiting, nasogastric tube placement EXAM: ABDOMEN - 1 VIEW COMPARISON:  Portable exam 1509 hours compared to 01/23/2018 at 1110 hours FINDINGS: Nasogastric tube tip projects over distal gastric antrum. Dilated gas-filled large and small bowel loops in the upper abdomen. LEFT basilar atelectasis and tiny LEFT pleural effusion. Atherosclerotic calcification aorta. Port-A-Cath tip projects over SVC. No acute osseous findings. IMPRESSION: Persistent gaseous distention of bowel loops in the upper abdomen. Tip of nasogastric tube projects over distal gastric antrum. LEFT basilar atelectasis and tiny LEFT pleural effusion. Electronically Signed   By: Lavonia Dana M.D.   On: 01/23/2018 16:01   Ct Chest W Contrast  Result Date: 01/15/2018 CLINICAL DATA:  Abdominal distention with no bowel movements. Radiographs demonstrated small-bowel obstruction or ileus. History of hysterectomy and appendectomy. EXAM: CT CHEST, ABDOMEN, AND PELVIS WITH CONTRAST TECHNIQUE: Multidetector  CT imaging of the chest, abdomen and pelvis was performed following the standard protocol during bolus administration of intravenous contrast. CONTRAST:  15mL ISOVUE-300 IOPAMIDOL (ISOVUE-300) INJECTION 61% COMPARISON:  Radiographs of the abdomen from 01/15/2018, CT abdomen pelvis 01/06/2018 FINDINGS: CT CHEST FINDINGS Cardiovascular: Conventional branch pattern of the great vessels with mild calcific atherosclerosis. No aneurysm or dissection. The thoracic aorta is nonaneurysmal demonstrates mild atherosclerosis at the arch and along the descending portion. Heart size is top normal without pericardial effusion or thickening. No significant coronary arteriosclerosis. No large central pulmonary embolus. Mediastinum/Nodes: No enlarged mediastinal, hilar, or axillary lymph nodes. The thyroid gland is excluded on this study. Patent trachea and mainstem bronchi. Unremarkable CT appearance of the thoracic esophagus. Lungs/Pleura: Linear atelectasis and/or scarring is noted in the left lower lobe. No acute pulmonary consolidation or dominant mass. No pneumothorax is noted. Musculoskeletal: No chest wall mass or suspicious bone lesions identified. CT ABDOMEN PELVIS FINDINGS Hepatobiliary: Homogeneous enhancement of the liver. Small left hepatic lobe cyst measuring approximately 1 cm, series 2/42. The gallbladder is distended without stones possibly from a fasting state. No mural thickening or pericholecystic fluid. Small amount perihepatic ascites is identified. Slight intrahepatic ductal dilatation may be due to fullness of the gallbladder. Pancreas: Coarse calcification noted in the body of the pancreas. No acute inflammatory process, mass or ductal dilatation. Spleen: Normal size spleen without mass. Adrenals/Urinary Tract: Normal bilateral adrenal glands and symmetric cortical enhancement of both kidneys. Delayed imaging through both kidneys demonstrate symmetric pyelograms without obstructive uropathy. The urinary  bladder is physiologically distended and unremarkable for the degree of distention. Stomach/Bowel: Small hiatal hernia is identified. The stomach is nondistended. The duodenal sweep and ligament of Treitz is unremarkable. Fluid-filled distended jejunal loops to 4.1 cm are identified with transition point suggested in the right lower quadrant, series 2, image 98. Additionally there is a short segmental area of luminal narrowing with mural thickening possibly representing small stricture or involvement by neoplasm is not excluded, series 2/89 measuring up to 1.7 cm in length with single wall thickness of up to 6 mm. The transition point appears to be adjacent to the right ovarian complex cystic mass and may be adherent as previously suggested on prior report. The patient is status post appendectomy. The colon is relatively decompressed in appearance. Vascular/Lymphatic: Patent portal and splenic veins. Moderate aortic iliac atherosclerosis without aneurysm. Lymphadenopathy identified by CT size criteria. Reproductive: Complex cystic  mass with septations and possible soft tissue components along the caudal aspect is identified in the right adnexa measuring 4.9 x 4 x 4.7 cm (AP by transverse by craniocaudad). Findings are concerning for possible ovarian neoplasm. Further correlation with MRI is recommended. A more simple appearing left adnexal cyst measuring 5.3 x 3.8 x 4.5 cm is identified. The patient is status post hysterectomy. Other: Small volume of ascites is noted in the upper abdomen. There appears to be rind like thickening of the peritoneum concerning for peritoneal caking, series 2/61 in the left upper quadrant in particular concerning for peritoneal carcinomatosis. Musculoskeletal: No acute or significant osseous findings. IMPRESSION: Chest CT: 1. No active cardiopulmonary disease. 2. Aortic atherosclerosis without aneurysm or dissection. 3. No large central pulmonary embolus. CT AP: 1. Dilated fluid-filled  loops of small bowel are redemonstrated slightly more extensive than on prior exam with transition point likely in the right adnexa adjacent to a complex cystic mass concerning for ovarian neoplasm given septations and soft tissue nodularity. This raises concern for early or partial SBO. This soft tissue mass measures 4.9 x 4 x 4.7 cm and has not changed since prior recent comparison. Additional short segmental area of luminal narrowing is noted in the right lower quadrant involving small bowel for which stigmata of peritoneal carcinomatosis or small-bowel metastatic implants might account for this. 2. Redemonstration of small volume of ascites predominantly in the upper abdomen surrounding the liver and spleen. 3. Redemonstration of thick bandlike omental thickening concerning for peritoneal carcinomatosis. Electronically Signed   By: Ashley Royalty M.D.   On: 01/15/2018 19:22   Ct Abdomen Pelvis W Contrast  Result Date: 01/15/2018 CLINICAL DATA:  Abdominal distention with no bowel movements. Radiographs demonstrated small-bowel obstruction or ileus. History of hysterectomy and appendectomy. EXAM: CT CHEST, ABDOMEN, AND PELVIS WITH CONTRAST TECHNIQUE: Multidetector CT imaging of the chest, abdomen and pelvis was performed following the standard protocol during bolus administration of intravenous contrast. CONTRAST:  111mL ISOVUE-300 IOPAMIDOL (ISOVUE-300) INJECTION 61% COMPARISON:  Radiographs of the abdomen from 01/15/2018, CT abdomen pelvis 01/06/2018 FINDINGS: CT CHEST FINDINGS Cardiovascular: Conventional branch pattern of the great vessels with mild calcific atherosclerosis. No aneurysm or dissection. The thoracic aorta is nonaneurysmal demonstrates mild atherosclerosis at the arch and along the descending portion. Heart size is top normal without pericardial effusion or thickening. No significant coronary arteriosclerosis. No large central pulmonary embolus. Mediastinum/Nodes: No enlarged mediastinal, hilar,  or axillary lymph nodes. The thyroid gland is excluded on this study. Patent trachea and mainstem bronchi. Unremarkable CT appearance of the thoracic esophagus. Lungs/Pleura: Linear atelectasis and/or scarring is noted in the left lower lobe. No acute pulmonary consolidation or dominant mass. No pneumothorax is noted. Musculoskeletal: No chest wall mass or suspicious bone lesions identified. CT ABDOMEN PELVIS FINDINGS Hepatobiliary: Homogeneous enhancement of the liver. Small left hepatic lobe cyst measuring approximately 1 cm, series 2/42. The gallbladder is distended without stones possibly from a fasting state. No mural thickening or pericholecystic fluid. Small amount perihepatic ascites is identified. Slight intrahepatic ductal dilatation may be due to fullness of the gallbladder. Pancreas: Coarse calcification noted in the body of the pancreas. No acute inflammatory process, mass or ductal dilatation. Spleen: Normal size spleen without mass. Adrenals/Urinary Tract: Normal bilateral adrenal glands and symmetric cortical enhancement of both kidneys. Delayed imaging through both kidneys demonstrate symmetric pyelograms without obstructive uropathy. The urinary bladder is physiologically distended and unremarkable for the degree of distention. Stomach/Bowel: Small hiatal hernia is identified. The stomach is nondistended.  The duodenal sweep and ligament of Treitz is unremarkable. Fluid-filled distended jejunal loops to 4.1 cm are identified with transition point suggested in the right lower quadrant, series 2, image 98. Additionally there is a short segmental area of luminal narrowing with mural thickening possibly representing small stricture or involvement by neoplasm is not excluded, series 2/89 measuring up to 1.7 cm in length with single wall thickness of up to 6 mm. The transition point appears to be adjacent to the right ovarian complex cystic mass and may be adherent as previously suggested on prior report.  The patient is status post appendectomy. The colon is relatively decompressed in appearance. Vascular/Lymphatic: Patent portal and splenic veins. Moderate aortic iliac atherosclerosis without aneurysm. Lymphadenopathy identified by CT size criteria. Reproductive: Complex cystic mass with septations and possible soft tissue components along the caudal aspect is identified in the right adnexa measuring 4.9 x 4 x 4.7 cm (AP by transverse by craniocaudad). Findings are concerning for possible ovarian neoplasm. Further correlation with MRI is recommended. A more simple appearing left adnexal cyst measuring 5.3 x 3.8 x 4.5 cm is identified. The patient is status post hysterectomy. Other: Small volume of ascites is noted in the upper abdomen. There appears to be rind like thickening of the peritoneum concerning for peritoneal caking, series 2/61 in the left upper quadrant in particular concerning for peritoneal carcinomatosis. Musculoskeletal: No acute or significant osseous findings. IMPRESSION: Chest CT: 1. No active cardiopulmonary disease. 2. Aortic atherosclerosis without aneurysm or dissection. 3. No large central pulmonary embolus. CT AP: 1. Dilated fluid-filled loops of small bowel are redemonstrated slightly more extensive than on prior exam with transition point likely in the right adnexa adjacent to a complex cystic mass concerning for ovarian neoplasm given septations and soft tissue nodularity. This raises concern for early or partial SBO. This soft tissue mass measures 4.9 x 4 x 4.7 cm and has not changed since prior recent comparison. Additional short segmental area of luminal narrowing is noted in the right lower quadrant involving small bowel for which stigmata of peritoneal carcinomatosis or small-bowel metastatic implants might account for this. 2. Redemonstration of small volume of ascites predominantly in the upper abdomen surrounding the liver and spleen. 3. Redemonstration of thick bandlike omental  thickening concerning for peritoneal carcinomatosis. Electronically Signed   By: Ashley Royalty M.D.   On: 01/15/2018 19:22   Ct Abdomen Pelvis W Contrast  Result Date: 01/06/2018 CLINICAL DATA:  Acute onset of generalized abdominal pain and cramping. Abdominal bloating. EXAM: CT ABDOMEN AND PELVIS WITH CONTRAST TECHNIQUE: Multidetector CT imaging of the abdomen and pelvis was performed using the standard protocol following bolus administration of intravenous contrast. CONTRAST:  15mL ISOVUE-300 IOPAMIDOL (ISOVUE-300) INJECTION 61% COMPARISON:  CT of the abdomen performed 12/11/2006, and abdominal ultrasound performed 06/11/2008 FINDINGS: Lower chest: The visualized lung bases are grossly clear. The visualized portions of the mediastinum are unremarkable. Hepatobiliary: The liver is unremarkable in appearance. The gallbladder is decompressed and not well assessed given surrounding ascites. The common bile duct is normal in caliber. Pancreas: The pancreas is within normal limits. Spleen: The spleen is unremarkable in appearance. Adrenals/Urinary Tract: The adrenal glands are unremarkable in appearance. The kidneys are unremarkable. There is no evidence of hydronephrosis. No renal or ureteral stones are identified. No perinephric stranding is seen. Stomach/Bowel: There appears to be diffuse nodularity along the omentum at the left side of the abdomen, extending into the mesentery at the left mid abdomen, concerning for peritoneal carcinomatosis. The small  bowel is diffusely filled with fluid, but remains normal in caliber. Wall thickening is noted at the distal ileum adjacent to the ovarian mass described below, and bowel loops appear somewhat adherent to the ovarian mass. The stomach is unremarkable in appearance. The appendix is normal in caliber, without evidence of appendicitis. The colon is largely decompressed and grossly unremarkable in appearance. Vascular/Lymphatic: Scattered calcification is seen along  the abdominal aorta and its branches. The abdominal aorta is otherwise grossly unremarkable. The inferior vena cava is grossly unremarkable. No retroperitoneal lymphadenopathy is seen. No pelvic sidewall lymphadenopathy is identified. Reproductive: The bladder is mildly distended and grossly unremarkable. The patient is status post hysterectomy. A multilobulated cystic mass is noted at the right adnexa, measuring approximately 5.9 x 4.0 cm, with nodular components, concerning for primary ovarian malignancy. Two cystic lesions are noted at the left adnexa, measuring 5.8 cm and 3.2 cm, of uncertain significance. Other: Small volume ascites is noted within the abdomen and pelvis. Musculoskeletal: No acute osseous abnormalities are identified. The visualized musculature is unremarkable in appearance. IMPRESSION: 1. Complex cystic mass at the right adnexa, measuring 5.9 x 4.0 cm, with nodular components, concerning for primary ovarian malignancy. 2. Diffuse nodularity along the omentum at the left side of the abdomen, extending into the mesentery at the left mid abdomen, concerning for peritoneal carcinomatosis. 3. Wall thickening at the distal ileum adjacent to the ovarian mass; bowel loops appear somewhat adherent to the ovarian mass. Bowel infiltration with tumor cannot be excluded. No evidence of bowel obstruction at this time. 4. Small volume ascites within the abdomen and pelvis. Aortic Atherosclerosis (ICD10-I70.0). These results were called by telephone at the time of interpretation on 01/06/2018 at 2:56 am to Dr. Ezequiel Essex, who verbally acknowledged these results. Electronically Signed   By: Garald Balding M.D.   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 Request made for therapeutic paracentesis. EXAM: ULTRASOUND GUIDED THERAPEUTIC PARACENTESIS MEDICATIONS: None COMPLICATIONS:  None immediate. PROCEDURE: Informed written consent was obtained from the patient after a discussion of the risks, benefits and alternatives to treatment. A timeout was performed prior to the initiation of the procedure. Initial ultrasound scanning demonstrates a small amount of ascites within the right mid to lower abdominal quadrant. The right mid to lower abdomen was prepped and draped in the usual sterile fashion. 1% lidocaine was used for local anesthesia. Following this, a 6 Fr Safe-T-Centesis catheter was introduced. An ultrasound image was saved for documentation purposes. The paracentesis was performed. The catheter was removed and a dressing was applied. The patient tolerated the procedure well without immediate post procedural complication. FINDINGS: A total of approximately 450 cc of hazy, yellow fluid was removed. IMPRESSION: Successful ultrasound-guided therapeutic paracentesis yielding 450 cc of peritoneal fluid. Read by: Rowe Robert, PA-C Electronically Signed   By: Aletta Edouard M.D.   On: 01/23/2018 12:19   US Paracentesis  Result Date: 01/15/2018 INDICATION: Patient with history of abdominal pain, complex right adnexal cystic mass, elevated CA-125, omental nodularity, ascites. Request made for diagnostic and therapeutic paracentesis. EXAM: ULTRASOUND GUIDED DIAGNOSTIC AND THERAPEUTIC PARACENTESIS MEDICATIONS: None COMPLICATIONS: None immediate. PROCEDURE: Informed written consent was obtained from the patient after a discussion of the risks, benefits and alternatives to treatment. A timeout was performed prior to the initiation of the procedure. Initial ultrasound scanning demonstrates a small amount of ascites within the right lower abdominal quadrant. The right lower  abdomen was prepped and draped in the usual sterile fashion. 1% lidocaine was used for local anesthesia. Following this, a 19 gauge, 10-cm, Yueh catheter was introduced. An ultrasound image was saved for documentation  purposes. The paracentesis was performed. The catheter was removed and a dressing was applied. The patient tolerated the procedure well without immediate post procedural complication. FINDINGS: A total of approximately 1.5 liters of hazy, yellow fluid was removed. Samples were sent to the laboratory as requested by the clinical team. IMPRESSION: Successful ultrasound-guided diagnostic and therapeutic paracentesis yielding 1.5 liters of peritoneal fluid. Read by: Rowe Robert, PA-C Electronically Signed   By: Jacqulynn Cadet M.D.   On: 01/15/2018 14:43   Dg Abd 2 Views  Result Date: 01/23/2018 CLINICAL DATA:  Follow-up small bowel obstruction EXAM: ABDOMEN - 2 VIEW COMPARISON:  CT abdomen 01/15/2017, KUB 01/17/2017 FINDINGS: There is gaseous distention of small bowel and colon. There are multiple small bowel air-fluid levels. There are a few colonic air-fluid levels in the ascending colon. There is no evidence of pneumoperitoneum, portal venous gas or pneumatosis. There are no pathologic calcifications along the expected course of the ureters. The osseous structures are unremarkable. IMPRESSION: 1. Gaseous distention of small bowel and colon with a few small bowel and colonic air-fluid levels. This appearance can be seen with an ileus versus partial small bowel obstruction. Electronically Signed   By: Kathreen Devoid   On: 01/23/2018 11:26   Dg Abd 2 Views  Result Date: 01/17/2018 CLINICAL DATA:  Partial small bowel obstruction EXAM: ABDOMEN - 2 VIEW COMPARISON:  01/16/2018 FINDINGS: Dilated small bowel again noted with air-fluid levels compatible with small bowel obstruction. No real change. No organomegaly or free air. IMPRESSION: Continued small bowel obstruction pattern, unchanged. Electronically Signed   By: Rolm Baptise M.D.   On: 01/17/2018 11:26   Dg Abd 2 Views  Result Date: 01/16/2018 CLINICAL DATA:  74 y/o  F; small-bowel obstruction. EXAM: ABDOMEN - 2 VIEW COMPARISON:  01/15/2017 CT abdomen and  pelvis. 01/15/2017 abdomen radiographs. FINDINGS: Stable diffuse small bowel obstruction. No pneumoperitoneum. No acute osseous abnormality is evident. IMPRESSION: Stable diffuse small bowel obstruction. Electronically Signed   By: Kristine Garbe M.D.   On: 01/16/2018 16:02   Dg Abd 2 Views  Result Date: 01/15/2018 CLINICAL DATA:  Acute generalized abdominal pain and distention. History of ovarian cancer. EXAM: ABDOMEN - 2 VIEW COMPARISON:  CT scan of January 06, 2018. FINDINGS: Mildly dilated small bowel loops are noted concerning for distal small bowel obstruction or ileus. No colonic dilatation is noted. There is no evidence of free air. No radio-opaque calculi or other significant radiographic abnormality is seen. IMPRESSION: Mildly dilated small bowel loops are noted concerning for distal small bowel obstruction or possibly ileus. Electronically Signed   By: Marijo Conception, M.D.   On: 01/15/2018 14:25   Dg Abd Portable 1v  Result Date: 01/25/2018 CLINICAL DATA:  Followup small bowel obstruction. EXAM: PORTABLE ABDOMEN - 1 VIEW COMPARISON:  01/23/2018.  Abdomen and pelvis CT dated 01/15/2018. FINDINGS: Nasogastric tube tip and side hole in the stomach. Mildly decreased gas distended small bowel loops and mildly increased gas distended loops colon. Stool and gas in normal caliber rectum. Unremarkable bones. IMPRESSION: Mildly improved pattern of partial small bowel obstruction with mildly increased colonic ileus or partial obstruction. Electronically Signed   By: Claudie Revering M.D.   On: 01/25/2018 19:55   Ir Imaging Guided Port Insertion  Result Date: 01/17/2018 CLINICAL DATA:  OVARIAN CARCINOMA, PERITONEAL CARCINOMATOSIS, SMALL-BOWEL OBSTRUCTION EXAM: RIGHT INTERNAL JUGULAR SINGLE LUMEN POWER PORT CATHETER INSERTION Date:  01/17/2018 01/17/2018 1:28 pm Radiologist:  Jerilynn Mages. Daryll Brod, MD Guidance:  Ultrasound fluoroscopic MEDICATIONS: Ancef 2 g; The antibiotic was administered within an  appropriate time interval prior to skin puncture. ANESTHESIA/SEDATION: Versed 1.0 mg IV; Fentanyl 50 mcg IV; Moderate Sedation Time:  20 minutes The patient was continuously monitored during the procedure by the interventional radiology nurse under my direct supervision. FLUOROSCOPY TIME:  0 minutes, 18 seconds (3 mGy) COMPLICATIONS: None immediate. CONTRAST:  None PROCEDURE: Informed consent was obtained from the patient following explanation of the procedure, risks, benefits and alternatives. The patient understands, agrees and consents for the procedure. All questions were addressed. A time out was performed. Maximal barrier sterile technique utilized including caps, mask, sterile gowns, sterile gloves, large sterile drape, hand hygiene, and 2% chlorhexidine scrub. Under sterile conditions and local anesthesia, right internal jugular micropuncture venous access was performed. Access was performed with ultrasound. Images were obtained for documentation of the patent right internal jugular vein. A guide wire was inserted followed by a transitional dilator. This allowed insertion of a guide wire and catheter into the IVC. Measurements were obtained from the SVC / RA junction back to the right IJ venotomy site. In the right infraclavicular chest, a subcutaneous pocket was created over the second anterior rib. This was done under sterile conditions and local anesthesia. 1% lidocaine with epinephrine was utilized for this. A 2.5 cm incision was made in the skin. Blunt dissection was performed to create a subcutaneous pocket over the right pectoralis major muscle. The pocket was flushed with saline vigorously. There was adequate hemostasis. The port catheter was assembled and checked for leakage. The port catheter was secured in the pocket with two retention sutures. The tubing was tunneled subcutaneously to the right venotomy site and inserted into the SVC/RA junction through a valved peel-away sheath. Position was  confirmed with fluoroscopy. Images were obtained for documentation. The patient tolerated the procedure well. No immediate complications. Incisions were closed in a two layer fashion with 4 - 0 Vicryl suture. Dermabond was applied to the skin. The port catheter was accessed, blood was aspirated followed by saline and heparin flushes. Needle was removed. A dry sterile dressing was applied. IMPRESSION: Ultrasound and fluoroscopically guided right internal jugular single lumen power port catheter insertion. Tip in the SVC/RA junction. Catheter ready for use. Electronically Signed   By: Jerilynn Mages.  Shick M.D.   On: 01/17/2018 13:32   Vas [place] Lower Extremity Venous (dvt)  Result Date: 02/02/2018  Lower Venous Study Indications: Swelling, and Pain.  Risk Factors: Cancer Ovarian Cancer, Peritoneal carcinomatosis. Limitations: Body habitus. Comparison Study: RLEV exam on 07/27/2016. Performing Technologist: Rudell Cobb  Examination Guidelines: A complete evaluation includes B-mode imaging, spectral Doppler, color Doppler, and power Doppler as needed of all accessible portions of each vessel. Bilateral testing is considered an integral part of a complete examination. Limited examinations for reoccurring indications may be performed as noted.  Right Venous Findings: +---------+---------------+---------+-----------+----------+-------+          CompressibilityPhasicitySpontaneityPropertiesSummary +---------+---------------+---------+-----------+----------+-------+ CFV      Full           No       Yes                          +---------+---------------+---------+-----------+----------+-------+ SFJ      Full                                                 +---------+---------------+---------+-----------+----------+-------+  FV Prox  Full                                                 +---------+---------------+---------+-----------+----------+-------+ FV Mid   Full                                                  +---------+---------------+---------+-----------+----------+-------+ FV DistalFull                                                 +---------+---------------+---------+-----------+----------+-------+ PFV      Full                                                 +---------+---------------+---------+-----------+----------+-------+ POP      Full           No       Yes                  Dilated +---------+---------------+---------+-----------+----------+-------+ PTV      Full                                                 +---------+---------------+---------+-----------+----------+-------+ PERO     Full                                                 +---------+---------------+---------+-----------+----------+-------+  Left Venous Findings: +---------+---------------+---------+-----------+----------+-------+          CompressibilityPhasicitySpontaneityPropertiesSummary +---------+---------------+---------+-----------+----------+-------+ CFV      Full           Yes      Yes                          +---------+---------------+---------+-----------+----------+-------+ SFJ      Full                                                 +---------+---------------+---------+-----------+----------+-------+ FV Prox  Full                                                 +---------+---------------+---------+-----------+----------+-------+ FV Mid   Full                                                 +---------+---------------+---------+-----------+----------+-------+ FV  DistalFull                                                 +---------+---------------+---------+-----------+----------+-------+ PFV      Full                                                 +---------+---------------+---------+-----------+----------+-------+ POP      Full           No       Yes                           +---------+---------------+---------+-----------+----------+-------+ PTV      Full                                                 +---------+---------------+---------+-----------+----------+-------+ PERO     Full                                                 +---------+---------------+---------+-----------+----------+-------+    Summary: Right: There is no evidence of deep vein thrombosis in the lower extremity. However, the CFV and Popliteal vein are lack of phasicity. No cystic structure found in the popliteal fossa. Left: There is no evidence of deep vein thrombosis in the lower extremity. Noticed the popliteal vein is lack of phasicity. No cystic structure found in the popliteal fossa.  *See table(s) above for measurements and observations. Electronically signed by Servando Snare MD on 02/02/2018 at 12:09:30 PM.    Final     Microbiology: No results found for this or any previous visit (from the past 240 hour(s)).   Labs: Basic Metabolic Panel: Recent Labs  Lab 01/28/18 0356 01/29/18 0438 01/30/18 0530 01/31/18 0406 02/01/18 0408 02/02/18 0236  NA 135 137 137 137 134* 135  K 3.2* 3.0* 3.4* 3.8 3.1* 4.1  CL 101 104 107 107 101 102  CO2 24 25 25 24 25 27   GLUCOSE 73 92 105* 102* 90 95  BUN <5* <5* 5* 9 11 15   CREATININE 0.56 0.58 0.63 0.62 0.59 0.65  CALCIUM 8.0* 7.8* 7.8* 8.4* 8.0* 8.5*  MG 1.8 1.8  --   --   --   --    Liver Function Tests: Recent Labs  Lab 01/27/18 0400  AST 15  ALT 14  ALKPHOS 79  BILITOT 0.5  PROT 5.4*  ALBUMIN 2.3*   No results for input(s): LIPASE, AMYLASE in the last 168 hours. No results for input(s): AMMONIA in the last 168 hours. CBC: Recent Labs  Lab 01/27/18 0400 01/28/18 0356 01/29/18 0438 01/31/18 0406 02/01/18 0408 02/02/18 0236  WBC 7.3 19.3* 19.9* 13.3* 10.7* 8.0  NEUTROABS 3.4 14.3* 16.9* 8.6*  --   --   HGB 10.0* 9.7* 9.6* 10.5* 9.7* 9.5*  HCT 31.8* 30.8* 30.4* 33.6* 31.0* 30.5*  MCV 88.6 90.3 88.6 90.1 90.4 90.8   PLT 225 251 246 275 227 228  Cardiac Enzymes: No results for input(s): CKTOTAL, CKMB, CKMBINDEX, TROPONINI in the last 168 hours. BNP: BNP (last 3 results) No results for input(s): BNP in the last 8760 hours.  ProBNP (last 3 results) No results for input(s): PROBNP in the last 8760 hours.  CBG: No results for input(s): GLUCAP in the last 168 hours.     Signed:  Kayleen Memos, MD [facility] 02/02/2018, 2:21 PM

## 2018-02-03 DIAGNOSIS — R180 Malignant ascites: Secondary | ICD-10-CM | POA: Diagnosis not present

## 2018-02-03 DIAGNOSIS — I10 Essential (primary) hypertension: Secondary | ICD-10-CM | POA: Diagnosis not present

## 2018-02-03 DIAGNOSIS — I480 Paroxysmal atrial fibrillation: Secondary | ICD-10-CM | POA: Diagnosis not present

## 2018-02-03 DIAGNOSIS — C569 Malignant neoplasm of unspecified ovary: Secondary | ICD-10-CM | POA: Diagnosis not present

## 2018-02-03 DIAGNOSIS — C786 Secondary malignant neoplasm of retroperitoneum and peritoneum: Secondary | ICD-10-CM | POA: Diagnosis not present

## 2018-02-05 ENCOUNTER — Telehealth: Payer: Self-pay | Admitting: Oncology

## 2018-02-05 ENCOUNTER — Other Ambulatory Visit: Payer: Self-pay | Admitting: *Deleted

## 2018-02-05 ENCOUNTER — Telehealth: Payer: Self-pay | Admitting: *Deleted

## 2018-02-05 ENCOUNTER — Telehealth: Payer: Self-pay | Admitting: Family Medicine

## 2018-02-05 DIAGNOSIS — I10 Essential (primary) hypertension: Secondary | ICD-10-CM

## 2018-02-05 DIAGNOSIS — M79606 Pain in leg, unspecified: Secondary | ICD-10-CM | POA: Insufficient documentation

## 2018-02-05 NOTE — Telephone Encounter (Signed)
Called Haley [name] and advised her of appointments on 02/08/18 and 02/09/18. She verbalized agreement.

## 2018-02-05 NOTE — Telephone Encounter (Signed)
Copied from [place] [id number]. Topic: Quick Communication - Home Health Verbal Orders >> Feb 05, 2018  4:35 PM Ivar Drape wrote: Caller/Agency:   Timmothy Sours OT w/[facility] Callback Number:   [phone] Requesting OT/PT/Skilled Nursing/Social Work:  for OT for ADL, Transfers and Exercize  Frequency: 1w1, 0w1, 1w1 for two total visits

## 2018-02-05 NOTE — Consult Note (Signed)
[facility] Care Management hospital liaison follow up.  Please see initial bedside visit encounter on 02/01/2018.   Chart reviewed. Noted Mrs. [place] discharged home on 02/02/2018 with [facility].  Will make referral to [facility] for complex case management and [facility] Pharmacist for medication review.   Marthenia Rolling, MSN-Ed, RN,BSN [facility] Liaison [phone]

## 2018-02-05 NOTE — Telephone Encounter (Signed)
Message sent to Dr. Jordan for review and approval. 

## 2018-02-05 NOTE — Telephone Encounter (Signed)
Pt is scheduled with Dr Martinique on 02/06/2018 at 9:30am Will do TCM prior to her appt.

## 2018-02-05 NOTE — Patient Outreach (Signed)
[facility]) Care Management  02/05/2018  [place] [birth year] [id number]   Subjective:  Case discussed with [facility] Liaison Orrin Brigham Hall),who has also referred patient to [facility].   Patient primary providers office will complete transition of care follow up.   Telephone call to patient's home number, spoke with patient, and HIPAA verified.  Discussed [facility] Care Management [employer] EMMI General Discharge follow up, patient voiced understanding, and is in agreement to follow up.  Patient states she is doing good, receiving home health physical therapy, therapy going well, appetite is good, sleeping better, having no pain at this time, pain manageable as needed with ibuprofen, and has a follow up appointment with primary MD on 02/06/2018.   States she is waiting for oncologist office to call back with follow up appointment date and time.   Patient states she has the following durable medical equipment: scale, pulse ox.   Patient states she recently started feeling chilling, is planning to check temperature, and go back to bed to rest under the covers.  States she will call MD if she has a fever.   Patient aware of signs / symptoms to report to MD, is aware of how to reach providers as needed, and has filled all prescriptions through local pharmacy as needed.   States she will also ask primary MD to arrange for new medications prescriptions to be sent to mail order pharmacy as needed.  Patient states she is able to manage self care and has assistance as needed.  Patient voices understanding of medical diagnosis and treatment plan.  Patient states she does not have any education material, EMMI follow up, transportation, or community resource, needs at this time.  Patient advised that she has been referred to [facility], is in agreement with referral.      Objective: Per KPN  (Knowledge Performance Now, point of care tool) and chart review, patient hospitalized 01/15/2018 - 02/02/2018 for Partial small bowel obstruction.   Patient also has a history of ovarian cancer, Carcinomatosis, PAF (paroxysmal atrial fibrillation), and Hypothyroidism.         Assessment: Received [employer] EMMI General Discharge follow up referral on 02/05/2018.  Red Alert Triggers times 2, Day #1, patient answered no to the following question: Know who to call about changes in condition?  Patient answered yes to the following question: Unfilled prescriptions?    EMMI follow up completed.      Plan: Patient has been referred to [facility].       Laiklynn Raczynski H. Annia Friendly, BSN, [place] Management [facility] Telephonic CM Phone: [phone] Fax: [phone]

## 2018-02-05 NOTE — Telephone Encounter (Signed)
PCP: Martinique, Letica G, MD  Admit date: 01/15/2018 Discharge date: 02/02/2018  Time spent: 35 minutes  Recommendations for Outpatient Follow-up:  1. Follow-up with your oncologist within a week 2. Follow-up with your PCP 3. Take your medications as prescribed 4. Continue physical therapy 5. Fall precautions  Discharge Condition: Stable  Diet recommendation: Resume previous diet   Transition Care Management Follow-up Telephone Call   Date discharged?  02/02/2018    How have you been since you were released from the hospital? "I feel better at home than I did in the hospital, good to be home. Doing very well except for my legs hurting a whole lot. Doing pretty descent, therapy is coming out and helping me with exercises. PT comes 2 x per week." " Some foot pain Right foot"   Do you understand why you were in the hospital? Yes, abdominal pain and ovarian cancer/cyst   Do you understand the discharge instructions? therapy is coming out and helping me with exercises. PT comes 2 x per week.    Where were you discharged to? Home with PT   Items Reviewed:  Medications reviewed: reviewed during HFU today- Cannot take Gabapentin - does not work well for her for sx control. Pt states that Ibuprofen 600mg  BID seems to help better. "I am not sleeping well since coming home"  Allergies reviewed:  reviewed during HFU today  Dietary changes reviewed: yes  Referrals reviewed: yes   Functional Questionnaire:   Activities of Daily Living (ADLs):   She states they are independent in the following: ambulation, bathing and hygiene, feeding, continence, grooming, toileting and dressing States they require assistance with the following: PT comes out to help with exercises 2x per week.   Any transportation issues/concerns?: no   Any patient concerns? yes, Cannot take Gabapentin - does not work well for her for sx control. Pt states that Ibuprofen 600mg  BID seems to help better. "I am  not sleeping well since coming home"   Confirmed importance and date/time of follow-up visits scheduled yes  Provider Appointment booked with Dr Martinique 02/06/18   Confirmed with patient if condition begins to worsen call PCP or go to the ER.  Patient was given the office number and encouraged to call back with question or concerns.  : yes

## 2018-02-05 NOTE — Telephone Encounter (Signed)
It is okay to give verbal authorization for OT and skilled nursing evaluation as requested. Thanks,  BJ

## 2018-02-06 ENCOUNTER — Ambulatory Visit: Payer: Self-pay | Admitting: Pharmacist

## 2018-02-06 ENCOUNTER — Ambulatory Visit (INDEPENDENT_AMBULATORY_CARE_PROVIDER_SITE_OTHER): Payer: PPO | Admitting: Family Medicine

## 2018-02-06 ENCOUNTER — Encounter: Payer: Self-pay | Admitting: Family Medicine

## 2018-02-06 VITALS — BP 118/64 | HR 74 | Temp 98.8°F | Resp 12 | Ht 62.0 in | Wt 130.5 lb

## 2018-02-06 DIAGNOSIS — C800 Disseminated malignant neoplasm, unspecified: Secondary | ICD-10-CM | POA: Diagnosis not present

## 2018-02-06 DIAGNOSIS — F419 Anxiety disorder, unspecified: Secondary | ICD-10-CM | POA: Diagnosis not present

## 2018-02-06 DIAGNOSIS — K219 Gastro-esophageal reflux disease without esophagitis: Secondary | ICD-10-CM | POA: Diagnosis not present

## 2018-02-06 DIAGNOSIS — M79606 Pain in leg, unspecified: Secondary | ICD-10-CM | POA: Diagnosis not present

## 2018-02-06 DIAGNOSIS — R1084 Generalized abdominal pain: Secondary | ICD-10-CM

## 2018-02-06 DIAGNOSIS — I10 Essential (primary) hypertension: Secondary | ICD-10-CM | POA: Diagnosis not present

## 2018-02-06 DIAGNOSIS — I480 Paroxysmal atrial fibrillation: Secondary | ICD-10-CM

## 2018-02-06 MED ORDER — METOPROLOL SUCCINATE ER 50 MG PO TB24: 50.0000 mg | ORAL_TABLET | Freq: Every day | ORAL | 2 refills | Status: DC

## 2018-02-06 MED ORDER — DILTIAZEM HCL 30 MG PO TABS: 30.0000 mg | ORAL_TABLET | Freq: Four times a day (QID) | ORAL | 1 refills | Status: DC

## 2018-02-06 MED ORDER — OXYCODONE HCL 5 MG PO TABS: 5.0000 mg | ORAL_TABLET | Freq: Two times a day (BID) | ORAL | 0 refills | Status: DC | PRN

## 2018-02-06 MED ORDER — FAMOTIDINE 40 MG PO TABS: 40.0000 mg | ORAL_TABLET | Freq: Every day | ORAL | 2 refills | Status: DC

## 2018-02-06 NOTE — Progress Notes (Signed)
HPI:   Ms.Lilyian C Felmlee is a 73 y.o. female, who is here today to follow on recent hospitalization.   She was last seen on 11/03/17. Since her last OV she has been diagnosed with ovarian cancer,complicated with peritoneal carcinomatosis and ascitics.  She was hospitalized on 01/15/18 and discharged on 02/02/18. She presented to the ER because severe abdominal pain that did not respond to analgesics. Dx with partial SBO  HH has been arranged, PT x 2 weekly and OT. She is not sure about nurse visits arrangement.   Abdominal pain has improved. She is still on soft diet, she would like to be able to eat potatoes now that she is feeling better. She states that she did not receive Oxycodone listed on her med list. She does not feel like she needs it now but would like to have it in case she needs it for severe pain. States that she does not want to take med daily,afraid of getting addicted. Hx of constipation but has felt better,having bowel movements daily.  Last bowel movement yesterday. + Flatus.  Mild nausea,no vomiting.  She has an appt with oncologist on 02/08/18. Received chemo x 1 Friday and tolerated well overall.  C/O LE pain, R>L, which she has had for long time. Gabapentin did not help and she did not tolerate well,so she discontinued. Ibuprofen 600 mg bid helps with pain.  Atrial fib with RVR, Diltiazem 30 mg qid was started. Metoprolol Succinate increased from 25 mg to 50 mg. She is on Xarelto 20 mg daily. HTN, Losartan 25 mg was discontinued.  Tolerating meds well,denies side effects.   GERD: She is not taking Omeprazole. She is on Pepcid 20 mg bid,which has helped.  Decrease smoking , she has a cigar occasionally.It does not taste goo after hospitalization.  Anxiety,still on Xanax 0.5 mg, 1/2-1 tab daily.   Review of Systems  Constitutional: Positive for fatigue. Negative for activity change, appetite change and fever.  HENT: Negative for mouth  sores, nosebleeds and trouble swallowing.   Respiratory: Negative for cough, shortness of breath and wheezing.   Cardiovascular: Negative for chest pain, palpitations and leg swelling.  Gastrointestinal: Positive for nausea. Negative for abdominal pain and vomiting.  Genitourinary: Negative for decreased urine volume, dysuria and hematuria.  Musculoskeletal: Positive for arthralgias and myalgias. Negative for gait problem.  Neurological: Negative for seizures, syncope, numbness and headaches.  Psychiatric/Behavioral: The patient is nervous/anxious.       Current Outpatient Medications on File Prior to Visit  Medication Sig Dispense Refill  . ALPRAZolam (XANAX) 0.5 MG tablet TAKE ONE TABLET BY MOUTH ONCE DAILY AS NEEDED (Patient taking differently: Take 0.25-0.5 mg by mouth daily as needed for anxiety. TAKE ONE TABLET BY MOUTH ONCE DAILY AS NEEDED) 90 tablet 3  . Calcium Carbonate (CALTRATE 600) 1500 MG TABS Take by mouth.      . cholecalciferol (VITAMIN D) 1000 UNITS tablet Take 1,000 Units by mouth daily.     . fluticasone (CUTIVATE) 0.05 % cream Apply 1 application topically at bedtime.     . fluticasone (FLONASE) 50 MCG/ACT nasal spray Use 2 sprays in each nostril daily 48 g 2  . ibuprofen (ADVIL,MOTRIN) 200 MG tablet Take 600 mg by mouth daily as needed for moderate pain.    Marland Kitchen lactose free nutrition (BOOST PLUS) LIQD Take 237 mLs by mouth daily. 10 Can 0  . levothyroxine (SYNTHROID, LEVOTHROID) 75 MCG tablet Take 1 tablet (75 mcg total) by mouth  daily. 90 tablet 3  . Menthol-Methyl Salicylate (MUSCLE RUB) 10-15 % CREA Apply 1 application topically as needed for muscle pain.    . Multiple Vitamins-Minerals (MULTIVITAMIN WITH MINERALS) tablet Take 1 tablet by mouth daily.      . Omega-3 Fatty Acids (FISH OIL) 1000 MG CAPS Take 2 capsules by mouth 2 (two) times daily.    Marland Kitchen omeprazole (PRILOSEC) 40 MG capsule Take 1 capsule (40 mg total) by mouth daily. 30 capsule 1  . polyethylene glycol  (MIRALAX / GLYCOLAX) packet Take 17 g by mouth daily. 14 each 0  . rivaroxaban (XARELTO) 20 MG TABS tablet Take 1 tablet (20 mg total) by mouth daily with supper. 30 tablet 0  . simvastatin (ZOCOR) 20 MG tablet Take 1 tablet (20 mg total) by mouth at bedtime. 90 tablet 0  . vitamin C (ASCORBIC ACID) 500 MG tablet Take 500 mg by mouth 2 (two) times daily.      No current facility-administered medications on file prior to visit.      Past Medical History:  Diagnosis Date  . Allergy    SEASONAL  . Cataract    BILATERAL  . Fibromyalgia   . GERD (gastroesophageal reflux disease)   . H/O echocardiogram 04/28/08   EF>55% trace mitral regurgitation, No significant valvular pathology  . Hemorrhoids    internal  . History of stress test 02/08/2011   Normal Myocardial perfusion study, this is a low risk scan, No prior study available for comparison  . Hyperlipidemia   . MVP (mitral valve prolapse)    mild and MR  . OA (osteoarthritis)   . Osteoporosis   . Paroxysmal A-fib ([place])   . Sleep apnea    does not use prescribed CPAP  . Thyroid disease    HYPO  . Varicosities of leg   . Vitamin D deficiency    Allergies  Allergen Reactions  . Tramadol Hcl     REACTION: jittery  . Tramadol Nausea And Vomiting    Social History   Socioeconomic History  . Marital status: Married    Spouse name: Not on file  . Number of children: Not on file  . Years of education: Not on file  . Highest education level: Not on file  Occupational History  . Not on file  Social Needs  . Financial resource strain: Not on file  . Food insecurity:    Worry: Not on file    Inability: Not on file  . Transportation needs:    Medical: Not on file    Non-medical: Not on file  Tobacco Use  . Smoking status: Current Every Day Smoker    Packs/day: 0.50    Years: 10.00    Pack years: 5.00    Types: Cigars  . Smokeless tobacco: Never Used  . Tobacco comment: peach flavor   Substance and Sexual Activity    . Alcohol use: No    Alcohol/week: 0.0 standard drinks  . Drug use: No  . Sexual activity: Yes    Comment: 1st intercourse 17yo-1 partner  Lifestyle  . Physical activity:    Days per week: Not on file    Minutes per session: Not on file  . Stress: Not on file  Relationships  . Social connections:    Talks on phone: Not on file    Gets together: Not on file    Attends religious service: Not on file    Active member of club or organization: Not on file  Attends meetings of clubs or organizations: Not on file    Relationship status: Not on file  Other Topics Concern  . Not on file  Social History Narrative  . Not on file    Vitals:   02/06/18 1013  BP: 118/64  Pulse: 74  Resp: 12  Temp: 98.8 F (37.1 C)  SpO2: 96%   Body mass index is 23.87 kg/m.   Physical Exam  Nursing note and vitals reviewed. Constitutional: She is oriented to person, place, and time. She appears well-developed and well-nourished. No distress.  HENT:  Head: Normocephalic and atraumatic.  Mouth/Throat: Oropharynx is clear and moist and mucous membranes are normal.  Eyes: Pupils are equal, round, and reactive to light. Conjunctivae are normal.  Cardiovascular: Normal rate and regular rhythm.  No murmur heard. Pulses:      Dorsalis pedis pulses are 2+ on the right side and 2+ on the left side.  Respiratory: Effort normal and breath sounds normal. No respiratory distress.  GI: Soft. She exhibits no mass. There is no abdominal tenderness.  Musculoskeletal:        General: No edema.  Neurological: She is alert and oriented to person, place, and time. She has normal strength. No cranial nerve deficit. Gait normal.  Skin: Skin is warm. No rash noted. No erythema.  Psychiatric: She has a normal mood and affect.  Well groomed, poor eye contact.    ASSESSMENT AND PLAN:  Ms. Tila was seen today for tcm/hospital follow-up.  Diagnoses and all orders for this visit:  Gastroesophageal reflux  disease, esophagitis presence not specified Well controlled. No changes in Pepcid. GERD precautions.  -     famotidine (PEPCID) 40 MG tablet; Take 1 tablet (40 mg total) by mouth daily.  Pain of lower extremity, unspecified laterality She does not want to stop Ibuprofen because it helps with pain. Hx of fibromyalgia.  We discussed side effects of NSAID's and risk of bleeding when taken with Xarelto. Instructed about warning signs.  PAF (paroxysmal atrial fibrillation) (HCC) Today rate and rhythm controlled. No changes in management.  -     metoprolol succinate (TOPROL-XL) 50 MG 24 hr tablet; Take 1 tablet (50 mg total) by mouth daily. Take with or immediately following a meal. -     diltiazem (CARDIZEM) 30 MG tablet; Take 1 tablet (30 mg total) by mouth every 6 (six) hours.  Carcinomatosis [facility]) Continue following with oncologist and gyn. Next chemo treatment this coming Friday.  Essential hypertension, benign Adequately controlled. No changes in current management.  -     metoprolol succinate (TOPROL-XL) 50 MG 24 hr tablet; Take 1 tablet (50 mg total) by mouth daily. Take with or immediately following a meal. -     diltiazem (CARDIZEM) 30 MG tablet; Take 1 tablet (30 mg total) by mouth every 6 (six) hours.  Anxiety Stable. No changes in Xanax dose.  Generalized abdominal pain Pain has greatly improved. Oxycodone sent to her pharmacy to take daily prn. Side effects discussed,including constipation. Adequate fiver intake. Recommend start advancing diet slowly as tolerated.  Other orders -     OxyCODONE (OXY IR/ROXICODONE) 5 MG immediate release tablet; Take 1 tablet (5 mg total) by mouth every 12 (twelve) hours as needed for severe pain.       Latorie G. Martinique, MD  [facility]. [place] office.

## 2018-02-06 NOTE — Patient Instructions (Addendum)
A few things to remember from today's visit:   Gastroesophageal reflux disease, esophagitis presence not specified  PAF (paroxysmal atrial fibrillation) (HCC)  Carcinomatosis (HCC)  Essential hypertension, benign  Anxiety  Generalized abdominal pain   Please be sure medication list is accurate. If a new problem present, please set up appointment sooner than planned today.

## 2018-02-06 NOTE — Telephone Encounter (Signed)
Left detailed message for [facility] with verbal orders as requested. Advised to return call if he had any questions.

## 2018-02-07 ENCOUNTER — Telehealth: Payer: Self-pay | Admitting: Family Medicine

## 2018-02-07 ENCOUNTER — Ambulatory Visit: Payer: Self-pay | Admitting: Pharmacist

## 2018-02-07 ENCOUNTER — Other Ambulatory Visit: Payer: Self-pay

## 2018-02-07 NOTE — Telephone Encounter (Signed)
Per chart Oxycodone was sent in yesterday but looks like it went to mail order instead of local.

## 2018-02-07 NOTE — Patient Outreach (Signed)
[facility]) Care Management  02/07/2018  [place] 02-06-1945 [id number]   Unsuccessful outreach attempt. Left HIPAA compliant voice message requesting a return call.   PLAN Will follow up in 3-4 business days.   [place] [phone]

## 2018-02-07 NOTE — Telephone Encounter (Signed)
Message sent to Dr. Jordan for review. Please advise 

## 2018-02-07 NOTE — Telephone Encounter (Signed)
Copied from [place]. Topic: General - Other >> Feb 07, 2018  2:49 PM Keene Breath wrote: Reason for CRM: Patient called to inform the doctor that she is in severe pain and needs something stronger than the IBU that she is taking at home.  Patient stated that she did not sleep last night and wants the doctor to call her something in today.  Patient would like it to be sent to her local pharmacy so she can get it right away.  Please advise and call patient back if needed at [phone]

## 2018-02-07 NOTE — Telephone Encounter (Signed)
Copied from [place] [id number]. Topic: General - Other >> Feb 07, 2018  8:23 AM Keene Breath wrote: Reason for CRM: Pharmacy called to get clarification on the patient's script for her medication, oxyCODONE (OXY IR/ROXICODONE) 5 MG immediate release tablet.  Please call to explain and verify.  CB# [phone]

## 2018-02-08 ENCOUNTER — Other Ambulatory Visit: Payer: Self-pay | Admitting: Hematology and Oncology

## 2018-02-08 ENCOUNTER — Inpatient Hospital Stay: Payer: PPO

## 2018-02-08 ENCOUNTER — Other Ambulatory Visit: Payer: Self-pay

## 2018-02-08 ENCOUNTER — Encounter: Payer: Self-pay | Admitting: Hematology and Oncology

## 2018-02-08 ENCOUNTER — Inpatient Hospital Stay (HOSPITAL_BASED_OUTPATIENT_CLINIC_OR_DEPARTMENT_OTHER): Payer: PPO | Admitting: Hematology and Oncology

## 2018-02-08 DIAGNOSIS — C786 Secondary malignant neoplasm of retroperitoneum and peritoneum: Secondary | ICD-10-CM | POA: Diagnosis not present

## 2018-02-08 DIAGNOSIS — M79606 Pain in leg, unspecified: Secondary | ICD-10-CM

## 2018-02-08 DIAGNOSIS — C569 Malignant neoplasm of unspecified ovary: Secondary | ICD-10-CM

## 2018-02-08 DIAGNOSIS — C801 Malignant (primary) neoplasm, unspecified: Secondary | ICD-10-CM | POA: Diagnosis not present

## 2018-02-08 DIAGNOSIS — I480 Paroxysmal atrial fibrillation: Secondary | ICD-10-CM

## 2018-02-08 DIAGNOSIS — D6481 Anemia due to antineoplastic chemotherapy: Secondary | ICD-10-CM | POA: Diagnosis not present

## 2018-02-08 DIAGNOSIS — Z79899 Other long term (current) drug therapy: Secondary | ICD-10-CM | POA: Diagnosis not present

## 2018-02-08 DIAGNOSIS — T451X5A Adverse effect of antineoplastic and immunosuppressive drugs, initial encounter: Secondary | ICD-10-CM

## 2018-02-08 LAB — CMP (CANCER CENTER ONLY)
ALT: 12 U/L (ref 0–44)
AST: 14 U/L — ABNORMAL LOW (ref 15–41)
Albumin: 3.1 g/dL — ABNORMAL LOW (ref 3.5–5.0)
Alkaline Phosphatase: 89 U/L (ref 38–126)
Anion gap: 8 (ref 5–15)
BUN: 14 mg/dL (ref 8–23)
CO2: 26 mmol/L (ref 22–32)
Calcium: 9.3 mg/dL (ref 8.9–10.3)
Chloride: 105 mmol/L (ref 98–111)
Creatinine: 0.75 mg/dL (ref 0.44–1.00)
GFR, Est AFR Am: >60 mL/min (ref >60)
GFR, Estimated: >60 mL/min (ref >60)
Glucose, Bld: 115 mg/dL — ABNORMAL HIGH (ref 70–99)
Potassium: 3.8 mmol/L (ref 3.5–5.1)
Sodium: 139 mmol/L (ref 135–145)
Total Bilirubin: <0.2 mg/dL — ABNORMAL LOW (ref 0.3–1.2)
Total Protein: 7 g/dL (ref 6.5–8.1)

## 2018-02-08 LAB — CBC WITH DIFFERENTIAL (CANCER CENTER ONLY)
ABS IMMATURE GRANULOCYTES: 0.09 10*3/uL — AB (ref 0.00–0.07)
Basophils Absolute: 0.1 10*3/uL (ref 0.0–0.1)
Basophils Relative: 2 %
Eosinophils Absolute: 0.2 10*3/uL (ref 0.0–0.5)
Eosinophils Relative: 3 %
HCT: 33.4 % — ABNORMAL LOW (ref 36.0–46.0)
Hemoglobin: 10.3 g/dL — ABNORMAL LOW (ref 12.0–15.0)
Immature Granulocytes: 1 %
Lymphocytes Relative: 29 %
Lymphs Abs: 2.2 10*3/uL (ref 0.7–4.0)
MCH: 27.8 pg (ref 26.0–34.0)
MCHC: 30.8 g/dL (ref 30.0–36.0)
MCV: 90 fL (ref 80.0–100.0)
Monocytes Absolute: 0.7 10*3/uL (ref 0.1–1.0)
Monocytes Relative: 9 %
NEUTROS ABS: 4.2 10*3/uL (ref 1.7–7.7)
Neutrophils Relative %: 56 %
Platelet Count: 311 10*3/uL (ref 150–400)
RBC: 3.71 MIL/uL — ABNORMAL LOW (ref 3.87–5.11)
RDW: 15 % (ref 11.5–15.5)
WBC Count: 7.5 10*3/uL (ref 4.0–10.5)
nRBC: 0 % (ref 0.0–0.2)

## 2018-02-08 MED ORDER — OXYCODONE HCL 5 MG PO TABS: 5.0000 mg | ORAL_TABLET | ORAL | 0 refills | Status: DC | PRN

## 2018-02-08 MED ORDER — ONDANSETRON HCL 8 MG PO TABS: 8.0000 mg | ORAL_TABLET | Freq: Three times a day (TID) | ORAL | 3 refills | Status: DC | PRN

## 2018-02-08 MED ORDER — DEXAMETHASONE 4 MG PO TABS: ORAL_TABLET | ORAL | 0 refills | Status: DC

## 2018-02-08 MED ORDER — LIDOCAINE-PRILOCAINE 2.5-2.5 % EX CREA: 1.0000 "application " | TOPICAL_CREAM | CUTANEOUS | 6 refills | Status: DC | PRN

## 2018-02-08 MED ORDER — PROCHLORPERAZINE MALEATE 10 MG PO TABS: 10.0000 mg | ORAL_TABLET | Freq: Four times a day (QID) | ORAL | 0 refills | Status: DC | PRN

## 2018-02-08 NOTE — Progress Notes (Signed)
Called patient and left voicemail to introduce myself as Arboriculturist and to offer available resources. Left my contact name and number to return my call.  Available resources are the one-time $700 [place] and copay assistance through PAF.

## 2018-02-08 NOTE — Telephone Encounter (Signed)
Message sent to Dr. Jordan for review. 

## 2018-02-08 NOTE — Telephone Encounter (Signed)
She called and left a message. [name] does not have Oxycodone Rx. [place] and canceled Rx. Dr. Alvy Bimler to send RX to [facility] in [place], [place].

## 2018-02-09 ENCOUNTER — Inpatient Hospital Stay: Payer: PPO

## 2018-02-09 ENCOUNTER — Other Ambulatory Visit: Payer: Self-pay | Admitting: Pharmacist

## 2018-02-09 ENCOUNTER — Telehealth: Payer: Self-pay

## 2018-02-09 ENCOUNTER — Encounter: Payer: Self-pay | Admitting: Hematology and Oncology

## 2018-02-09 ENCOUNTER — Other Ambulatory Visit: Payer: Self-pay | Admitting: Hematology and Oncology

## 2018-02-09 VITALS — BP 114/56 | HR 64 | Temp 98.2°F | Resp 18

## 2018-02-09 DIAGNOSIS — C569 Malignant neoplasm of unspecified ovary: Secondary | ICD-10-CM

## 2018-02-09 DIAGNOSIS — C786 Secondary malignant neoplasm of retroperitoneum and peritoneum: Secondary | ICD-10-CM | POA: Diagnosis not present

## 2018-02-09 DIAGNOSIS — D6481 Anemia due to antineoplastic chemotherapy: Secondary | ICD-10-CM | POA: Insufficient documentation

## 2018-02-09 DIAGNOSIS — M79606 Pain in leg, unspecified: Secondary | ICD-10-CM

## 2018-02-09 DIAGNOSIS — T451X5A Adverse effect of antineoplastic and immunosuppressive drugs, initial encounter: Secondary | ICD-10-CM

## 2018-02-09 LAB — CA 125: Cancer Antigen (CA) 125: 445 U/mL — ABNORMAL HIGH (ref 0.0–38.1)

## 2018-02-09 MED ORDER — PALONOSETRON HCL INJECTION 0.25 MG/5ML
INTRAVENOUS | Status: AC
  Filled 2018-02-09: qty 5

## 2018-02-09 MED ORDER — SODIUM CHLORIDE 0.9 % IV SOLN
Freq: Once | INTRAVENOUS | Status: AC
  Administered 2018-02-09: 09:00:00 via INTRAVENOUS
  Filled 2018-02-09: qty 5

## 2018-02-09 MED ORDER — SODIUM CHLORIDE 0.9 % IV SOLN
Freq: Once | INTRAVENOUS | Status: AC
  Administered 2018-02-09: 08:00:00 via INTRAVENOUS
  Filled 2018-02-09: qty 250

## 2018-02-09 MED ORDER — SODIUM CHLORIDE 0.9 % IV SOLN
405.6000 mg | Freq: Once | INTRAVENOUS | Status: AC
  Administered 2018-02-09: 410 mg via INTRAVENOUS
  Filled 2018-02-09: qty 41

## 2018-02-09 MED ORDER — DIPHENHYDRAMINE HCL 50 MG/ML IJ SOLN
50.0000 mg | Freq: Once | INTRAMUSCULAR | Status: AC
  Administered 2018-02-09: 50 mg via INTRAVENOUS

## 2018-02-09 MED ORDER — FAMOTIDINE IN NACL 20-0.9 MG/50ML-% IV SOLN
20.0000 mg | Freq: Once | INTRAVENOUS | Status: AC
  Administered 2018-02-09: 20 mg via INTRAVENOUS

## 2018-02-09 MED ORDER — HEPARIN SOD (PORK) LOCK FLUSH 100 UNIT/ML IV SOLN
500.0000 [IU] | Freq: Once | INTRAVENOUS | Status: AC | PRN
  Administered 2018-02-09: 500 [IU]
  Filled 2018-02-09: qty 5

## 2018-02-09 MED ORDER — SODIUM CHLORIDE 0.9 % IV SOLN
175.0000 mg/m2 | Freq: Once | INTRAVENOUS | Status: AC
  Administered 2018-02-09: 270 mg via INTRAVENOUS
  Filled 2018-02-09: qty 45

## 2018-02-09 MED ORDER — SODIUM CHLORIDE 0.9% FLUSH
10.0000 mL | INTRAVENOUS | Status: DC | PRN
  Administered 2018-02-09: 10 mL
  Filled 2018-02-09: qty 10

## 2018-02-09 MED ORDER — FAMOTIDINE IN NACL 20-0.9 MG/50ML-% IV SOLN
INTRAVENOUS | Status: AC
  Filled 2018-02-09: qty 50

## 2018-02-09 MED ORDER — DIPHENHYDRAMINE HCL 50 MG/ML IJ SOLN
INTRAMUSCULAR | Status: AC
  Filled 2018-02-09: qty 1

## 2018-02-09 MED ORDER — PALONOSETRON HCL INJECTION 0.25 MG/5ML
0.2500 mg | Freq: Once | INTRAVENOUS | Status: AC
  Administered 2018-02-09: 0.25 mg via INTRAVENOUS

## 2018-02-09 NOTE — Assessment & Plan Note (Signed)

## 2018-02-09 NOTE — Telephone Encounter (Signed)
It seems like Oxycodone was already sent to her pharmacy on 02/08/18.  Thanks, BJ

## 2018-02-09 NOTE — Progress Notes (Signed)
[place] OFFICE PROGRESS NOTE  Patient Care Team: [place], Emalina G, MD as PCP - General (Family Medicine) Lavera Guise, [facility] as [place] Management (Pharmacist) Neldon Labella, RN as [place] Management  ASSESSMENT & PLAN:  Ovarian cancer [facility]) suspected Overall, she has responded well to chemotherapy with near complete resolution of her symptoms of nausea, abdominal bloating and pain Tumor marker is improving She has no detectable ascites on exam We will proceed with cycle 2 of neoadjuvant chemotherapy as scheduled My plan would be to repeat imaging study after cycle 3 of therapy  PAF (paroxysmal atrial fibrillation) ([place]) She does not have detectable atrial fibrillation on exam today She will continue medical management and anticoagulation as directed  Lower extremity pain The patient has been complaining extensively about her right lower extremity pain which I believe is unrelated to her cancer pain The patient has significant poor quality of life due to this pain and it was unresolved with over-the-counter analgesics We discussed extensively about chronic pain management We discussed narcotic refill policy Per patient request, I have sent a new prescription to her local pharmacy  Anemia due to antineoplastic chemotherapy This is likely due to recent treatment. The patient denies recent history of bleeding such as epistaxis, hematuria or hematochezia. She is asymptomatic from the anemia. I will observe for now.  She does not require transfusion now. I will continue the chemotherapy at current dose without dosage adjustment.  If the anemia gets progressive worse in the future, I might have to delay her treatment or adjust the chemotherapy dose.    No orders of the defined types were placed in this encounter.   INTERVAL HISTORY: Please see below for problem oriented charting. She returns to the office for follow-up and  to be seen prior to cycle 2 of treatment Please see my original consult note from the hospital for further details She was recently discharged after diagnosis and management of ovarian cancer.  Her first cycle of chemotherapy was given in the hospital. Her posttreatment course was complicated by brief episode of bowel obstruction requiring placement of NG tube and paroxysmal atrial fibrillation Since discharge from the hospital, she is eating better.  Although she had lost some weight, she had no recurrence of ascites She denies constipation Her main complaint today is severe, right lower extremity pain that has been present before the diagnosis of cancer Her pain is so bad that it is interfering with her quality of life. She saw her primary care doctor recently but prescription of her pain medicine was sent into mail order.  She repeatedly stated she cannot wait for that medication to be mailed to her house She denies peripheral neuropathy from treatment The patient denies any recent signs or symptoms of bleeding such as spontaneous epistaxis, hematuria or hematochezia. She has no recent symptoms of shortness of breath or chest pain  SUMMARY OF ONCOLOGIC HISTORY:   Ovarian cancer ([place]) suspected   01/06/2018 Imaging    Ct abdomen and pelvis 1. Complex cystic mass at the right adnexa, measuring 5.9 x 4.0 cm, with nodular components, concerning for primary ovarian malignancy. 2. Diffuse nodularity along the omentum at the left side of the abdomen, extending into the mesentery at the left mid abdomen, concerning for peritoneal carcinomatosis. 3. Wall thickening at the distal ileum adjacent to the ovarian mass; bowel loops appear somewhat adherent to the ovarian mass. Bowel infiltration with tumor cannot be excluded. No evidence of  bowel obstruction at this time. 4. Small volume ascites within the abdomen and pelvis.  Aortic Atherosclerosis (ICD10-I70.0).    01/08/2018 Tumor Marker     Patient's tumor was tested for the following markers: CA-125. Results of the tumor marker test revealed 3004    01/15/2018 Imaging    Chest CT:  1. No active cardiopulmonary disease. 2. Aortic atherosclerosis without aneurysm or dissection. 3. No large central pulmonary embolus.  CT AP:  1. Dilated fluid-filled loops of small bowel are redemonstrated slightly more extensive than on prior exam with transition point likely in the right adnexa adjacent to a complex cystic mass concerning for ovarian neoplasm given septations and soft tissue nodularity. This raises concern for early or partial SBO. This soft tissue mass measures 4.9 x 4 x 4.7 cm and has not changed since prior recent comparison. Additional short segmental area of luminal narrowing is noted in the right lower quadrant involving small bowel for which stigmata of peritoneal carcinomatosis or small-bowel metastatic implants might account for this. 2. Redemonstration of small volume of ascites predominantly in the upper abdomen surrounding the liver and spleen. 3. Redemonstration of thick bandlike omental thickening concerning for peritoneal carcinomatosis.     01/15/2018 Procedure    Successful ultrasound-guided diagnostic and therapeutic paracentesis yielding 1.5 liters of peritoneal fluid.     01/15/2018 Pathology Results    PERITONEAL/ASCITIC FLUID (SPECIMEN 1 OF 1 COLLECTED 01/18/18): MALIGNANT CELLS CONSISTENT WITH METASTATIC ADENOCARCINOMA. SEE COMMENT. COMMENT: THE MALIGNANT CELLS ARE POSITIVE FOR MOC-31, CYTOKERATIN 7, ESTROGEN RECEPTOR, PAX-8, AND WT-1. THEY ARE NEGATIVE FOR CALRETININ, CYTOKERATIN 5/6, AND CYTOKERATIN 20. THE PROFILE IS CONSISTENT WITH A PRIMARY GYNECOLOGIC CARCINOMA. THERE IS LIKELY SUFFICIENT TUMOR PRESENT, IF ADDITIONAL STUDIES ARE REQUESTED.    01/15/2018 - 02/02/2018 Hospital Admission    She was admitted to the hospital for SBO. She was treated with chemotherapy    01/18/2018 -  Chemotherapy    The  patient had carboplatin and taxol    02/08/2018 Cancer Staging    Staging form: Ovary, Fallopian Tube, and Primary Peritoneal Carcinoma, AJCC 8th Edition - Clinical: cT3, cN0, cM0 - Signed by Heath Lark, MD on 02/08/2018    02/08/2018 Tumor Marker    Patient's tumor was tested for the following markers: CA-125. Results of the tumor marker test revealed 445     REVIEW OF SYSTEMS:   Constitutional: Denies fevers, chills or abnormal weight loss Eyes: Denies blurriness of vision Ears, nose, mouth, throat, and face: Denies mucositis or sore throat Respiratory: Denies cough, dyspnea or wheezes Cardiovascular: Denies palpitation, chest discomfort or lower extremity swelling Gastrointestinal:  Denies nausea, heartburn or change in bowel habits Skin: Denies abnormal skin rashes Lymphatics: Denies new lymphadenopathy or easy bruising Neurological:Denies numbness, tingling or new weaknesses Behavioral/Psych: Mood is stable, no new changes  All other systems were reviewed with the patient and are negative.  I have reviewed the past medical history, past surgical history, social history and family history with the patient and they are unchanged from previous note.  ALLERGIES:  is allergic to tramadol hcl and tramadol.  MEDICATIONS:  Current Outpatient Medications  Medication Sig Dispense Refill  . ALPRAZolam (XANAX) 0.5 MG tablet TAKE ONE TABLET BY MOUTH ONCE DAILY AS NEEDED (Patient taking differently: Take 0.25-0.5 mg by mouth daily as needed for anxiety. TAKE ONE TABLET BY MOUTH ONCE DAILY AS NEEDED) 90 tablet 3  . Calcium Carbonate (CALTRATE 600) 1500 MG TABS Take by mouth.      . cholecalciferol (VITAMIN  D) 1000 UNITS tablet Take 1,000 Units by mouth daily.     Marland Kitchen dexamethasone (DECADRON) 4 MG tablet Take 2 tabs at the night before and 2 tabs the morning of chemotherapy, every 3 weeks, by mouth 20 tablet 0  . diltiazem (CARDIZEM) 30 MG tablet Take 1 tablet (30 mg total) by mouth every 6  (six) hours. 120 tablet 1  . famotidine (PEPCID) 40 MG tablet Take 1 tablet (40 mg total) by mouth daily. 30 tablet 2  . fluticasone (CUTIVATE) 0.05 % cream Apply 1 application topically at bedtime.     . fluticasone (FLONASE) 50 MCG/ACT nasal spray Use 2 sprays in each nostril daily 48 g 2  . ibuprofen (ADVIL,MOTRIN) 200 MG tablet Take 600 mg by mouth daily as needed for moderate pain.    Marland Kitchen lactose free nutrition (BOOST PLUS) LIQD Take 237 mLs by mouth daily. 10 Can 0  . levothyroxine (SYNTHROID, LEVOTHROID) 75 MCG tablet Take 1 tablet (75 mcg total) by mouth daily. 90 tablet 3  . lidocaine-prilocaine (EMLA) cream Apply 1 application topically as needed. 30 g 6  . Menthol-Methyl Salicylate (MUSCLE RUB) 10-15 % CREA Apply 1 application topically as needed for muscle pain.    . metoprolol succinate (TOPROL-XL) 50 MG 24 hr tablet Take 1 tablet (50 mg total) by mouth daily. Take with or immediately following a meal. 30 tablet 2  . Multiple Vitamins-Minerals (MULTIVITAMIN WITH MINERALS) tablet Take 1 tablet by mouth daily.      . Omega-3 Fatty Acids (FISH OIL) 1000 MG CAPS Take 2 capsules by mouth 2 (two) times daily.    Marland Kitchen omeprazole (PRILOSEC) 40 MG capsule Take 1 capsule (40 mg total) by mouth daily. 30 capsule 1  . ondansetron (ZOFRAN) 8 MG tablet Take 1 tablet (8 mg total) by mouth every 8 (eight) hours as needed for nausea. 30 tablet 3  . oxyCODONE (OXY IR/ROXICODONE) 5 MG immediate release tablet Take 1 tablet (5 mg total) by mouth every 4 (four) hours as needed for severe pain. 30 tablet 0  . polyethylene glycol (MIRALAX / GLYCOLAX) packet Take 17 g by mouth daily. 14 each 0  . prochlorperazine (COMPAZINE) 10 MG tablet Take 1 tablet (10 mg total) by mouth every 6 (six) hours as needed for nausea or vomiting. 30 tablet 0  . rivaroxaban (XARELTO) 20 MG TABS tablet Take 1 tablet (20 mg total) by mouth daily with supper. 30 tablet 0  . simvastatin (ZOCOR) 20 MG tablet Take 1 tablet (20 mg total) by  mouth at bedtime. 90 tablet 0  . vitamin C (ASCORBIC ACID) 500 MG tablet Take 500 mg by mouth 2 (two) times daily.      No current facility-administered medications for this visit.     PHYSICAL EXAMINATION: ECOG PERFORMANCE STATUS: 2 - Symptomatic, <50% confined to bed  Vitals:   02/08/18 1137  BP: 120/70  Pulse: (!) 58  Resp: 18  Temp: 98.4 F (36.9 C)  SpO2: 100%   Filed Weights   02/08/18 1137  Weight: 131 lb 3.2 oz (59.5 kg)    GENERAL:alert, no distress and comfortable SKIN: skin color, texture, turgor are normal, no rashes or significant lesions EYES: normal, Conjunctiva are pink and non-injected, sclera clear OROPHARYNX:no exudate, no erythema and lips, buccal mucosa, and tongue normal  NECK: supple, thyroid normal size, non-tender, without nodularity LYMPH:  no palpable lymphadenopathy in the cervical, axillary or inguinal LUNGS: clear to auscultation and percussion with normal breathing effort HEART: regular rate &  rhythm and no murmurs and no lower extremity edema ABDOMEN:abdomen soft, non-tender and normal bowel sounds Musculoskeletal:no cyanosis of digits and no clubbing  NEURO: alert & oriented x 3 with fluent speech, no focal motor/sensory deficits  LABORATORY DATA:  I have reviewed the data as listed    Component Value Date/Time   NA 139 02/08/2018 0937   K 3.8 02/08/2018 0937   CL 105 02/08/2018 0937   CO2 26 02/08/2018 0937   GLUCOSE 115 (H) 02/08/2018 0937   BUN 14 02/08/2018 0937   CREATININE 0.75 02/08/2018 0937   CREATININE 0.76 11/17/2015 1005   CALCIUM 9.3 02/08/2018 0937   PROT 7.0 02/08/2018 0937   ALBUMIN 3.1 (L) 02/08/2018 0937   AST 14 (L) 02/08/2018 0937   ALT 12 02/08/2018 0937   ALKPHOS 89 02/08/2018 0937   BILITOT <0.2 (L) 02/08/2018 0937   GFRNONAA >60 02/08/2018 0937   GFRAA >60 02/08/2018 0937    No results found for: SPEP, UPEP  Lab Results  Component Value Date   WBC 7.5 02/08/2018   NEUTROABS 4.2 02/08/2018   HGB  10.3 (L) 02/08/2018   HCT 33.4 (L) 02/08/2018   MCV 90.0 02/08/2018   PLT 311 02/08/2018      Chemistry      Component Value Date/Time   NA 139 02/08/2018 0937   K 3.8 02/08/2018 0937   CL 105 02/08/2018 0937   CO2 26 02/08/2018 0937   BUN 14 02/08/2018 0937   CREATININE 0.75 02/08/2018 0937   CREATININE 0.76 11/17/2015 1005      Component Value Date/Time   CALCIUM 9.3 02/08/2018 0937   ALKPHOS 89 02/08/2018 0937   AST 14 (L) 02/08/2018 0937   ALT 12 02/08/2018 0937   BILITOT <0.2 (L) 02/08/2018 0937       RADIOGRAPHIC STUDIES: I have personally reviewed the radiological images as listed and agreed with the findings in the report. Dg Chest 2 View  Result Date: 01/30/2018 CLINICAL DATA:  Cough and congestion EXAM: CHEST - 2 VIEW COMPARISON:  01/15/2018 FINDINGS: Cardiac shadows within normal limits. New right chest wall port is noted in satisfactory position. The lungs are well aerated bilaterally. Minimal scarring is noted in the re- left lung base stable from the recent CT. No focal confluent infiltrate is seen. Minimal left pleural effusion is noted. IMPRESSION: New minimal left pleural effusion. No other acute abnormality is noted. Electronically Signed   By: Inez Catalina M.D.   On: 01/30/2018 09:26   Dg Abd 1 View  Result Date: 01/23/2018 CLINICAL DATA:  Nausea, vomiting, nasogastric tube placement EXAM: ABDOMEN - 1 VIEW COMPARISON:  Portable exam 1509 hours compared to 01/23/2018 at 1110 hours FINDINGS: Nasogastric tube tip projects over distal gastric antrum. Dilated gas-filled large and small bowel loops in the upper abdomen. LEFT basilar atelectasis and tiny LEFT pleural effusion. Atherosclerotic calcification aorta. Port-A-Cath tip projects over SVC. No acute osseous findings. IMPRESSION: Persistent gaseous distention of bowel loops in the upper abdomen. Tip of nasogastric tube projects over distal gastric antrum. LEFT basilar atelectasis and tiny LEFT pleural effusion.  Electronically Signed   By: Lavonia Dana M.D.   On: 01/23/2018 16:01   Ct Chest W Contrast  Result Date: 01/15/2018 CLINICAL DATA:  Abdominal distention with no bowel movements. Radiographs demonstrated small-bowel obstruction or ileus. History of hysterectomy and appendectomy. EXAM: CT CHEST, ABDOMEN, AND PELVIS WITH CONTRAST TECHNIQUE: Multidetector CT imaging of the chest, abdomen and pelvis was performed following the standard protocol during  bolus administration of intravenous contrast. CONTRAST:  148mL ISOVUE-300 IOPAMIDOL (ISOVUE-300) INJECTION 61% COMPARISON:  Radiographs of the abdomen from 01/15/2018, CT abdomen pelvis 01/06/2018 FINDINGS: CT CHEST FINDINGS Cardiovascular: Conventional branch pattern of the great vessels with mild calcific atherosclerosis. No aneurysm or dissection. The thoracic aorta is nonaneurysmal demonstrates mild atherosclerosis at the arch and along the descending portion. Heart size is top normal without pericardial effusion or thickening. No significant coronary arteriosclerosis. No large central pulmonary embolus. Mediastinum/Nodes: No enlarged mediastinal, hilar, or axillary lymph nodes. The thyroid gland is excluded on this study. Patent trachea and mainstem bronchi. Unremarkable CT appearance of the thoracic esophagus. Lungs/Pleura: Linear atelectasis and/or scarring is noted in the left lower lobe. No acute pulmonary consolidation or dominant mass. No pneumothorax is noted. Musculoskeletal: No chest wall mass or suspicious bone lesions identified. CT ABDOMEN PELVIS FINDINGS Hepatobiliary: Homogeneous enhancement of the liver. Small left hepatic lobe cyst measuring approximately 1 cm, series 2/42. The gallbladder is distended without stones possibly from a fasting state. No mural thickening or pericholecystic fluid. Small amount perihepatic ascites is identified. Slight intrahepatic ductal dilatation may be due to fullness of the gallbladder. Pancreas: Coarse calcification  noted in the body of the pancreas. No acute inflammatory process, mass or ductal dilatation. Spleen: Normal size spleen without mass. Adrenals/Urinary Tract: Normal bilateral adrenal glands and symmetric cortical enhancement of both kidneys. Delayed imaging through both kidneys demonstrate symmetric pyelograms without obstructive uropathy. The urinary bladder is physiologically distended and unremarkable for the degree of distention. Stomach/Bowel: Small hiatal hernia is identified. The stomach is nondistended. The duodenal sweep and ligament of Treitz is unremarkable. Fluid-filled distended jejunal loops to 4.1 cm are identified with transition point suggested in the right lower quadrant, series 2, image 98. Additionally there is a short segmental area of luminal narrowing with mural thickening possibly representing small stricture or involvement by neoplasm is not excluded, series 2/89 measuring up to 1.7 cm in length with single wall thickness of up to 6 mm. The transition point appears to be adjacent to the right ovarian complex cystic mass and may be adherent as previously suggested on prior report. The patient is status post appendectomy. The colon is relatively decompressed in appearance. Vascular/Lymphatic: Patent portal and splenic veins. Moderate aortic iliac atherosclerosis without aneurysm. Lymphadenopathy identified by CT size criteria. Reproductive: Complex cystic mass with septations and possible soft tissue components along the caudal aspect is identified in the right adnexa measuring 4.9 x 4 x 4.7 cm (AP by transverse by craniocaudad). Findings are concerning for possible ovarian neoplasm. Further correlation with MRI is recommended. A more simple appearing left adnexal cyst measuring 5.3 x 3.8 x 4.5 cm is identified. The patient is status post hysterectomy. Other: Small volume of ascites is noted in the upper abdomen. There appears to be rind like thickening of the peritoneum concerning for  peritoneal caking, series 2/61 in the left upper quadrant in particular concerning for peritoneal carcinomatosis. Musculoskeletal: No acute or significant osseous findings. IMPRESSION: Chest CT: 1. No active cardiopulmonary disease. 2. Aortic atherosclerosis without aneurysm or dissection. 3. No large central pulmonary embolus. CT AP: 1. Dilated fluid-filled loops of small bowel are redemonstrated slightly more extensive than on prior exam with transition point likely in the right adnexa adjacent to a complex cystic mass concerning for ovarian neoplasm given septations and soft tissue nodularity. This raises concern for early or partial SBO. This soft tissue mass measures 4.9 x 4 x 4.7 cm and has not changed since prior  recent comparison. Additional short segmental area of luminal narrowing is noted in the right lower quadrant involving small bowel for which stigmata of peritoneal carcinomatosis or small-bowel metastatic implants might account for this. 2. Redemonstration of small volume of ascites predominantly in the upper abdomen surrounding the liver and spleen. 3. Redemonstration of thick bandlike omental thickening concerning for peritoneal carcinomatosis. Electronically Signed   By: Ashley Royalty M.D.   On: 01/15/2018 19:22   Ct Abdomen Pelvis W Contrast  Result Date: 01/15/2018 CLINICAL DATA:  Abdominal distention with no bowel movements. Radiographs demonstrated small-bowel obstruction or ileus. History of hysterectomy and appendectomy. EXAM: CT CHEST, ABDOMEN, AND PELVIS WITH CONTRAST TECHNIQUE: Multidetector CT imaging of the chest, abdomen and pelvis was performed following the standard protocol during bolus administration of intravenous contrast. CONTRAST:  118mL ISOVUE-300 IOPAMIDOL (ISOVUE-300) INJECTION 61% COMPARISON:  Radiographs of the abdomen from 01/15/2018, CT abdomen pelvis 01/06/2018 FINDINGS: CT CHEST FINDINGS Cardiovascular: Conventional branch pattern of the great vessels with mild  calcific atherosclerosis. No aneurysm or dissection. The thoracic aorta is nonaneurysmal demonstrates mild atherosclerosis at the arch and along the descending portion. Heart size is top normal without pericardial effusion or thickening. No significant coronary arteriosclerosis. No large central pulmonary embolus. Mediastinum/Nodes: No enlarged mediastinal, hilar, or axillary lymph nodes. The thyroid gland is excluded on this study. Patent trachea and mainstem bronchi. Unremarkable CT appearance of the thoracic esophagus. Lungs/Pleura: Linear atelectasis and/or scarring is noted in the left lower lobe. No acute pulmonary consolidation or dominant mass. No pneumothorax is noted. Musculoskeletal: No chest wall mass or suspicious bone lesions identified. CT ABDOMEN PELVIS FINDINGS Hepatobiliary: Homogeneous enhancement of the liver. Small left hepatic lobe cyst measuring approximately 1 cm, series 2/42. The gallbladder is distended without stones possibly from a fasting state. No mural thickening or pericholecystic fluid. Small amount perihepatic ascites is identified. Slight intrahepatic ductal dilatation may be due to fullness of the gallbladder. Pancreas: Coarse calcification noted in the body of the pancreas. No acute inflammatory process, mass or ductal dilatation. Spleen: Normal size spleen without mass. Adrenals/Urinary Tract: Normal bilateral adrenal glands and symmetric cortical enhancement of both kidneys. Delayed imaging through both kidneys demonstrate symmetric pyelograms without obstructive uropathy. The urinary bladder is physiologically distended and unremarkable for the degree of distention. Stomach/Bowel: Small hiatal hernia is identified. The stomach is nondistended. The duodenal sweep and ligament of Treitz is unremarkable. Fluid-filled distended jejunal loops to 4.1 cm are identified with transition point suggested in the right lower quadrant, series 2, image 98. Additionally there is a short  segmental area of luminal narrowing with mural thickening possibly representing small stricture or involvement by neoplasm is not excluded, series 2/89 measuring up to 1.7 cm in length with single wall thickness of up to 6 mm. The transition point appears to be adjacent to the right ovarian complex cystic mass and may be adherent as previously suggested on prior report. The patient is status post appendectomy. The colon is relatively decompressed in appearance. Vascular/Lymphatic: Patent portal and splenic veins. Moderate aortic iliac atherosclerosis without aneurysm. Lymphadenopathy identified by CT size criteria. Reproductive: Complex cystic mass with septations and possible soft tissue components along the caudal aspect is identified in the right adnexa measuring 4.9 x 4 x 4.7 cm (AP by transverse by craniocaudad). Findings are concerning for possible ovarian neoplasm. Further correlation with MRI is recommended. A more simple appearing left adnexal cyst measuring 5.3 x 3.8 x 4.5 cm is identified. The patient is status post hysterectomy. Other: Small  volume of ascites is noted in the upper abdomen. There appears to be rind like thickening of the peritoneum concerning for peritoneal caking, series 2/61 in the left upper quadrant in particular concerning for peritoneal carcinomatosis. Musculoskeletal: No acute or significant osseous findings. IMPRESSION: Chest CT: 1. No active cardiopulmonary disease. 2. Aortic atherosclerosis without aneurysm or dissection. 3. No large central pulmonary embolus. CT AP: 1. Dilated fluid-filled loops of small bowel are redemonstrated slightly more extensive than on prior exam with transition point likely in the right adnexa adjacent to a complex cystic mass concerning for ovarian neoplasm given septations and soft tissue nodularity. This raises concern for early or partial SBO. This soft tissue mass measures 4.9 x 4 x 4.7 cm and has not changed since prior recent comparison.  Additional short segmental area of luminal narrowing is noted in the right lower quadrant involving small bowel for which stigmata of peritoneal carcinomatosis or small-bowel metastatic implants might account for this. 2. Redemonstration of small volume of ascites predominantly in the upper abdomen surrounding the liver and spleen. 3. Redemonstration of thick bandlike omental thickening concerning for peritoneal carcinomatosis. Electronically Signed   By: Ashley Royalty M.D.   On: 01/15/2018 19:22   US Paracentesis  Result Date: 01/23/2018 INDICATION: Patient with history of metastatic gyn/?ovarian carcinoma with recurrent malignant ascites. Request made for therapeutic paracentesis. EXAM: ULTRASOUND GUIDED THERAPEUTIC PARACENTESIS MEDICATIONS: None COMPLICATIONS: None immediate. PROCEDURE: Informed written consent was obtained from the patient after a discussion of the risks, benefits and alternatives to treatment. A timeout was performed prior to the initiation of the procedure. Initial ultrasound scanning demonstrates a small amount of ascites within the right mid to lower abdominal quadrant. The right mid to lower abdomen was prepped and draped in the usual sterile fashion. 1% lidocaine was used for local anesthesia. Following this, a 6 Fr Safe-T-Centesis catheter was introduced. An ultrasound image was saved for documentation purposes. The paracentesis was performed. The catheter was removed and a dressing was applied. The patient tolerated the procedure well without immediate post procedural complication. FINDINGS: A total of approximately 450 cc of hazy, yellow fluid was removed. IMPRESSION: Successful ultrasound-guided therapeutic paracentesis yielding 450 cc of peritoneal fluid. Read by: Rowe Robert, PA-C Electronically Signed   By: Aletta Edouard M.D.   On: 01/23/2018 12:19   US Paracentesis  Result Date: 01/15/2018 INDICATION: Patient with history of abdominal pain, complex right adnexal cystic  mass, elevated CA-125, omental nodularity, ascites. Request made for diagnostic and therapeutic paracentesis. EXAM: ULTRASOUND GUIDED DIAGNOSTIC AND THERAPEUTIC PARACENTESIS MEDICATIONS: None COMPLICATIONS: None immediate. PROCEDURE: Informed written consent was obtained from the patient after a discussion of the risks, benefits and alternatives to treatment. A timeout was performed prior to the initiation of the procedure. Initial ultrasound scanning demonstrates a small amount of ascites within the right lower abdominal quadrant. The right lower abdomen was prepped and draped in the usual sterile fashion. 1% lidocaine was used for local anesthesia. Following this, a 19 gauge, 10-cm, Yueh catheter was introduced. An ultrasound image was saved for documentation purposes. The paracentesis was performed. The catheter was removed and a dressing was applied. The patient tolerated the procedure well without immediate post procedural complication. FINDINGS: A total of approximately 1.5 liters of hazy, yellow fluid was removed. Samples were sent to the laboratory as requested by the clinical team. IMPRESSION: Successful ultrasound-guided diagnostic and therapeutic paracentesis yielding 1.5 liters of peritoneal fluid. Read by: Rowe Robert, PA-C Electronically Signed   By: Jacqulynn Cadet  M.D.   On: 01/15/2018 14:43   Dg Abd 2 Views  Result Date: 01/23/2018 CLINICAL DATA:  Follow-up small bowel obstruction EXAM: ABDOMEN - 2 VIEW COMPARISON:  CT abdomen 01/15/2017, KUB 01/17/2017 FINDINGS: There is gaseous distention of small bowel and colon. There are multiple small bowel air-fluid levels. There are a few colonic air-fluid levels in the ascending colon. There is no evidence of pneumoperitoneum, portal venous gas or pneumatosis. There are no pathologic calcifications along the expected course of the ureters. The osseous structures are unremarkable. IMPRESSION: 1. Gaseous distention of small bowel and colon with a few  small bowel and colonic air-fluid levels. This appearance can be seen with an ileus versus partial small bowel obstruction. Electronically Signed   By: Kathreen Devoid   On: 01/23/2018 11:26   Dg Abd 2 Views  Result Date: 01/17/2018 CLINICAL DATA:  Partial small bowel obstruction EXAM: ABDOMEN - 2 VIEW COMPARISON:  01/16/2018 FINDINGS: Dilated small bowel again noted with air-fluid levels compatible with small bowel obstruction. No real change. No organomegaly or free air. IMPRESSION: Continued small bowel obstruction pattern, unchanged. Electronically Signed   By: Rolm Baptise M.D.   On: 01/17/2018 11:26   Dg Abd 2 Views  Result Date: 01/16/2018 CLINICAL DATA:  73 y/o  F; small-bowel obstruction. EXAM: ABDOMEN - 2 VIEW COMPARISON:  01/15/2017 CT abdomen and pelvis. 01/15/2017 abdomen radiographs. FINDINGS: Stable diffuse small bowel obstruction. No pneumoperitoneum. No acute osseous abnormality is evident. IMPRESSION: Stable diffuse small bowel obstruction. Electronically Signed   By: Kristine Garbe M.D.   On: 01/16/2018 16:02   Dg Abd 2 Views  Result Date: 01/15/2018 CLINICAL DATA:  Acute generalized abdominal pain and distention. History of ovarian cancer. EXAM: ABDOMEN - 2 VIEW COMPARISON:  CT scan of January 06, 2018. FINDINGS: Mildly dilated small bowel loops are noted concerning for distal small bowel obstruction or ileus. No colonic dilatation is noted. There is no evidence of free air. No radio-opaque calculi or other significant radiographic abnormality is seen. IMPRESSION: Mildly dilated small bowel loops are noted concerning for distal small bowel obstruction or possibly ileus. Electronically Signed   By: Marijo Conception, M.D.   On: 01/15/2018 14:25   Dg Abd Portable 1v  Result Date: 01/25/2018 CLINICAL DATA:  Followup small bowel obstruction. EXAM: PORTABLE ABDOMEN - 1 VIEW COMPARISON:  01/23/2018.  Abdomen and pelvis CT dated 01/15/2018. FINDINGS: Nasogastric tube tip and side hole  in the stomach. Mildly decreased gas distended small bowel loops and mildly increased gas distended loops colon. Stool and gas in normal caliber rectum. Unremarkable bones. IMPRESSION: Mildly improved pattern of partial small bowel obstruction with mildly increased colonic ileus or partial obstruction. Electronically Signed   By: Claudie Revering M.D.   On: 01/25/2018 19:55   [facility] Guided Port Insertion  Result Date: 01/17/2018 CLINICAL DATA:  OVARIAN CARCINOMA, PERITONEAL CARCINOMATOSIS, SMALL-BOWEL OBSTRUCTION EXAM: RIGHT INTERNAL JUGULAR SINGLE LUMEN POWER PORT CATHETER INSERTION Date:  01/17/2018 01/17/2018 1:28 pm Radiologist:  Jerilynn Mages. Daryll Brod, MD Guidance:  Ultrasound fluoroscopic MEDICATIONS: Ancef 2 g; The antibiotic was administered within an appropriate time interval prior to skin puncture. ANESTHESIA/SEDATION: Versed 1.0 mg IV; Fentanyl 50 mcg IV; Moderate Sedation Time:  20 minutes The patient was continuously monitored during the procedure by the interventional radiology nurse under my direct supervision. FLUOROSCOPY TIME:  0 minutes, 18 seconds (3 mGy) COMPLICATIONS: None immediate. CONTRAST:  None PROCEDURE: Informed consent was obtained from the patient following explanation of the procedure, risks,  benefits and alternatives. The patient understands, agrees and consents for the procedure. All questions were addressed. A time out was performed. Maximal barrier sterile technique utilized including caps, mask, sterile gowns, sterile gloves, large sterile drape, hand hygiene, and 2% chlorhexidine scrub. Under sterile conditions and local anesthesia, right internal jugular micropuncture venous access was performed. Access was performed with ultrasound. Images were obtained for documentation of the patent right internal jugular vein. A guide wire was inserted followed by a transitional dilator. This allowed insertion of a guide wire and catheter into the IVC. Measurements were obtained from the SVC / RA  junction back to the right IJ venotomy site. In the right infraclavicular chest, a subcutaneous pocket was created over the second anterior rib. This was done under sterile conditions and local anesthesia. 1% lidocaine with epinephrine was utilized for this. A 2.5 cm incision was made in the skin. Blunt dissection was performed to create a subcutaneous pocket over the right pectoralis major muscle. The pocket was flushed with saline vigorously. There was adequate hemostasis. The port catheter was assembled and checked for leakage. The port catheter was secured in the pocket with two retention sutures. The tubing was tunneled subcutaneously to the right venotomy site and inserted into the SVC/RA junction through a valved peel-away sheath. Position was confirmed with fluoroscopy. Images were obtained for documentation. The patient tolerated the procedure well. No immediate complications. Incisions were closed in a two layer fashion with 4 - 0 Vicryl suture. Dermabond was applied to the skin. The port catheter was accessed, blood was aspirated followed by saline and heparin flushes. Needle was removed. A dry sterile dressing was applied. IMPRESSION: Ultrasound and fluoroscopically guided right internal jugular single lumen power port catheter insertion. Tip in the SVC/RA junction. Catheter ready for use. Electronically Signed   By: Jerilynn Mages.  Shick M.D.   On: 01/17/2018 13:32   Vas [place] Lower Extremity Venous (dvt)  Result Date: 02/02/2018  Lower Venous Study Indications: Swelling, and Pain.  Risk Factors: Cancer Ovarian Cancer, Peritoneal carcinomatosis. Limitations: Body habitus. Comparison Study: [facility] exam on 07/27/2016. Performing Technologist: Rudell Cobb  Examination Guidelines: A complete evaluation includes B-mode imaging, spectral Doppler, color Doppler, and power Doppler as needed of all accessible portions of each vessel. Bilateral testing is considered an integral part of a complete examination. Limited  examinations for reoccurring indications may be performed as noted.  Right Venous Findings: +---------+---------------+---------+-----------+----------+-------+          CompressibilityPhasicitySpontaneityPropertiesSummary +---------+---------------+---------+-----------+----------+-------+ CFV      Full           No       Yes                          +---------+---------------+---------+-----------+----------+-------+ SFJ      Full                                                 +---------+---------------+---------+-----------+----------+-------+ FV Prox  Full                                                 +---------+---------------+---------+-----------+----------+-------+ FV Mid   Full                                                 +---------+---------------+---------+-----------+----------+-------+  FV DistalFull                                                 +---------+---------------+---------+-----------+----------+-------+ PFV      Full                                                 +---------+---------------+---------+-----------+----------+-------+ POP      Full           No       Yes                  Dilated +---------+---------------+---------+-----------+----------+-------+ PTV      Full                                                 +---------+---------------+---------+-----------+----------+-------+ PERO     Full                                                 +---------+---------------+---------+-----------+----------+-------+  Left Venous Findings: +---------+---------------+---------+-----------+----------+-------+          CompressibilityPhasicitySpontaneityPropertiesSummary +---------+---------------+---------+-----------+----------+-------+ CFV      Full           Yes      Yes                          +---------+---------------+---------+-----------+----------+-------+ SFJ      Full                                                  +---------+---------------+---------+-----------+----------+-------+ FV Prox  Full                                                 +---------+---------------+---------+-----------+----------+-------+ FV Mid   Full                                                 +---------+---------------+---------+-----------+----------+-------+ FV DistalFull                                                 +---------+---------------+---------+-----------+----------+-------+ PFV      Full                                                 +---------+---------------+---------+-----------+----------+-------+ POP  Full           No       Yes                          +---------+---------------+---------+-----------+----------+-------+ PTV      Full                                                 +---------+---------------+---------+-----------+----------+-------+ PERO     Full                                                 +---------+---------------+---------+-----------+----------+-------+    Summary: Right: There is no evidence of deep vein thrombosis in the lower extremity. However, the CFV and Popliteal vein are lack of phasicity. No cystic structure found in the popliteal fossa. Left: There is no evidence of deep vein thrombosis in the lower extremity. Noticed the popliteal vein is lack of phasicity. No cystic structure found in the popliteal fossa.  *See table(s) above for measurements and observations. Electronically signed by Servando Snare MD on 02/02/2018 at 12:09:30 PM.    Final     All questions were answered. The patient knows to call the clinic with any problems, questions or concerns. No barriers to learning was detected.  I spent 25 minutes counseling the patient face to face. The total time spent in the appointment was 30 minutes and more than 50% was on counseling and review of test results  Heath Lark, MD 02/09/2018 7:21 AM

## 2018-02-09 NOTE — Telephone Encounter (Signed)
It seems like Rx for Oxycodone was resent by Dr Alvy Bimler, [place] on 02/08/18.  Charell Martinique, MD

## 2018-02-09 NOTE — Patient Instructions (Signed)
[facility] Discharge Instructions for Patients Receiving Chemotherapy  Today you received the following chemotherapy agents: Taxol, Carboplatin.  To help prevent nausea and vomiting after your treatment, we encourage you to take your nausea medication as prescribed.   If you develop nausea and vomiting that is not controlled by your nausea medication, call the clinic.   BELOW ARE SYMPTOMS THAT SHOULD BE REPORTED IMMEDIATELY:  *FEVER GREATER THAN 100.5 F  *CHILLS WITH OR WITHOUT FEVER  NAUSEA AND VOMITING THAT IS NOT CONTROLLED WITH YOUR NAUSEA MEDICATION  *UNUSUAL SHORTNESS OF BREATH  *UNUSUAL BRUISING OR BLEEDING  TENDERNESS IN MOUTH AND THROAT WITH OR WITHOUT PRESENCE OF ULCERS  *URINARY PROBLEMS  *BOWEL PROBLEMS  UNUSUAL RASH Items with * indicate a potential emergency and should be followed up as soon as possible.  Feel free to call the clinic should you have any questions or concerns. The clinic phone number is [phone].  Please show the [place] at check-in to the Emergency Department and triage nurse.  Paclitaxel injection (Taxol) What is this medicine? PACLITAXEL (PAK li TAX el) is a chemotherapy drug. It targets fast dividing cells, like cancer cells, and causes these cells to die. This medicine is used to treat ovarian cancer, breast cancer, lung cancer, Kaposi's sarcoma, and other cancers. This medicine may be used for other purposes; ask your health care provider or pharmacist if you have questions. COMMON BRAND NAME(S): Onxol, Taxol What should I tell my health care provider before I take this medicine? They need to know if you have any of these conditions: -history of irregular heartbeat -liver disease -low blood counts, like low white cell, platelet, or red cell counts -lung or breathing disease, like asthma -tingling of the fingers or toes, or other nerve disorder -an unusual or allergic reaction to paclitaxel, alcohol,  polyoxyethylated castor oil, other chemotherapy, other medicines, foods, dyes, or preservatives -pregnant or trying to get pregnant -breast-feeding How should I use this medicine? This drug is given as an infusion into a vein. It is administered in a hospital or clinic by a specially trained health care professional. Talk to your pediatrician regarding the use of this medicine in children. Special care may be needed. Overdosage: If you think you have taken too much of this medicine contact a poison control center or emergency room at once. NOTE: This medicine is only for you. Do not share this medicine with others. What if I miss a dose? It is important not to miss your dose. Call your doctor or health care professional if you are unable to keep an appointment. What may interact with this medicine? Do not take this medicine with any of the following medications: -disulfiram -metronidazole This medicine may also interact with the following medications: -antiviral medicines for hepatitis, HIV or AIDS -certain antibiotics like erythromycin and clarithromycin -certain medicines for fungal infections like ketoconazole and itraconazole -certain medicines for seizures like carbamazepine, phenobarbital, phenytoin -gemfibrozil -nefazodone -rifampin -St. John's wort This list may not describe all possible interactions. Give your health care provider a list of all the medicines, herbs, non-prescription drugs, or dietary supplements you use. Also tell them if you smoke, drink alcohol, or use illegal drugs. Some items may interact with your medicine. What should I watch for while using this medicine? Your condition will be monitored carefully while you are receiving this medicine. You will need important blood work done while you are taking this medicine. This medicine can cause serious allergic reactions. To reduce  your risk you will need to take other medicine(s) before treatment with this medicine.  If you experience allergic reactions like skin rash, itching or hives, swelling of the face, lips, or tongue, tell your doctor or health care professional right away. In some cases, you may be given additional medicines to help with side effects. Follow all directions for their use. This drug may make you feel generally unwell. This is not uncommon, as chemotherapy can affect healthy cells as well as cancer cells. Report any side effects. Continue your course of treatment even though you feel ill unless your doctor tells you to stop. Call your doctor or health care professional for advice if you get a fever, chills or sore throat, or other symptoms of a cold or flu. Do not treat yourself. This drug decreases your body's ability to fight infections. Try to avoid being around people who are sick. This medicine may increase your risk to bruise or bleed. Call your doctor or health care professional if you notice any unusual bleeding. Be careful brushing and flossing your teeth or using a toothpick because you may get an infection or bleed more easily. If you have any dental work done, tell your dentist you are receiving this medicine. Avoid taking products that contain aspirin, acetaminophen, ibuprofen, naproxen, or ketoprofen unless instructed by your doctor. These medicines may hide a fever. Do not become pregnant while taking this medicine. Women should inform their doctor if they wish to become pregnant or think they might be pregnant. There is a potential for serious side effects to an unborn child. Talk to your health care professional or pharmacist for more information. Do not breast-feed an infant while taking this medicine. Men are advised not to father a child while receiving this medicine. This product may contain alcohol. Ask your pharmacist or healthcare provider if this medicine contains alcohol. Be sure to tell all healthcare providers you are taking this medicine. Certain medicines, like  metronidazole and disulfiram, can cause an unpleasant reaction when taken with alcohol. The reaction includes flushing, headache, nausea, vomiting, sweating, and increased thirst. The reaction can last from 30 minutes to several hours. What side effects may I notice from receiving this medicine? Side effects that you should report to your doctor or health care professional as soon as possible: -allergic reactions like skin rash, itching or hives, swelling of the face, lips, or tongue -breathing problems -changes in vision -fast, irregular heartbeat -high or low blood pressure -mouth sores -pain, tingling, numbness in the hands or feet -signs of decreased platelets or bleeding - bruising, pinpoint red spots on the skin, black, tarry stools, blood in the urine -signs of decreased red blood cells - unusually weak or tired, feeling faint or lightheaded, falls -signs of infection - fever or chills, cough, sore throat, pain or difficulty passing urine -signs and symptoms of liver injury like dark yellow or brown urine; general ill feeling or flu-like symptoms; light-colored stools; loss of appetite; nausea; right upper belly pain; unusually weak or tired; yellowing of the eyes or skin -swelling of the ankles, feet, hands -unusually slow heartbeat Side effects that usually do not require medical attention (report to your doctor or health care professional if they continue or are bothersome): -diarrhea -hair loss -loss of appetite -muscle or joint pain -nausea, vomiting -pain, redness, or irritation at site where injected -tiredness This list may not describe all possible side effects. Call your doctor for medical advice about side effects. You may report side effects   to FDA at 1-800-FDA-1088. Where should I keep my medicine? This drug is given in a hospital or clinic and will not be stored at home. NOTE: This sheet is a summary. It may not cover all possible information. If you have questions  about this medicine, talk to your doctor, pharmacist, or health care provider.  2019 Elsevier/Gold Standard (2016-08-30 13:14:55)  Carboplatin injection What is this medicine? CARBOPLATIN (KAR boe pla tin) is a chemotherapy drug. It targets fast dividing cells, like cancer cells, and causes these cells to die. This medicine is used to treat ovarian cancer and many other cancers. This medicine may be used for other purposes; ask your health care provider or pharmacist if you have questions. COMMON BRAND NAME(S): Paraplatin What should I tell my health care provider before I take this medicine? They need to know if you have any of these conditions: -blood disorders -hearing problems -kidney disease -recent or ongoing radiation therapy -an unusual or allergic reaction to carboplatin, cisplatin, other chemotherapy, other medicines, foods, dyes, or preservatives -pregnant or trying to get pregnant -breast-feeding How should I use this medicine? This drug is usually given as an infusion into a vein. It is administered in a hospital or clinic by a specially trained health care professional. Talk to your pediatrician regarding the use of this medicine in children. Special care may be needed. Overdosage: If you think you have taken too much of this medicine contact a poison control center or emergency room at once. NOTE: This medicine is only for you. Do not share this medicine with others. What if I miss a dose? It is important not to miss a dose. Call your doctor or health care professional if you are unable to keep an appointment. What may interact with this medicine? -medicines for seizures -medicines to increase blood counts like filgrastim, pegfilgrastim, sargramostim -some antibiotics like amikacin, gentamicin, neomycin, streptomycin, tobramycin -vaccines Talk to your doctor or health care professional before taking any of these  medicines: -acetaminophen -aspirin -ibuprofen -ketoprofen -naproxen This list may not describe all possible interactions. Give your health care provider a list of all the medicines, herbs, non-prescription drugs, or dietary supplements you use. Also tell them if you smoke, drink alcohol, or use illegal drugs. Some items may interact with your medicine. What should I watch for while using this medicine? Your condition will be monitored carefully while you are receiving this medicine. You will need important blood work done while you are taking this medicine. This drug may make you feel generally unwell. This is not uncommon, as chemotherapy can affect healthy cells as well as cancer cells. Report any side effects. Continue your course of treatment even though you feel ill unless your doctor tells you to stop. In some cases, you may be given additional medicines to help with side effects. Follow all directions for their use. Call your doctor or health care professional for advice if you get a fever, chills or sore throat, or other symptoms of a cold or flu. Do not treat yourself. This drug decreases your body's ability to fight infections. Try to avoid being around people who are sick. This medicine may increase your risk to bruise or bleed. Call your doctor or health care professional if you notice any unusual bleeding. Be careful brushing and flossing your teeth or using a toothpick because you may get an infection or bleed more easily. If you have any dental work done, tell your dentist you are receiving this medicine. Avoid taking products   that contain aspirin, acetaminophen, ibuprofen, naproxen, or ketoprofen unless instructed by your doctor. These medicines may hide a fever. Do not become pregnant while taking this medicine. Women should inform their doctor if they wish to become pregnant or think they might be pregnant. There is a potential for serious side effects to an unborn child. Talk to  your health care professional or pharmacist for more information. Do not breast-feed an infant while taking this medicine. What side effects may I notice from receiving this medicine? Side effects that you should report to your doctor or health care professional as soon as possible: -allergic reactions like skin rash, itching or hives, swelling of the face, lips, or tongue -signs of infection - fever or chills, cough, sore throat, pain or difficulty passing urine -signs of decreased platelets or bleeding - bruising, pinpoint red spots on the skin, black, tarry stools, nosebleeds -signs of decreased red blood cells - unusually weak or tired, fainting spells, lightheadedness -breathing problems -changes in hearing -changes in vision -chest pain -high blood pressure -low blood counts - This drug may decrease the number of white blood cells, red blood cells and platelets. You may be at increased risk for infections and bleeding. -nausea and vomiting -pain, swelling, redness or irritation at the injection site -pain, tingling, numbness in the hands or feet -problems with balance, talking, walking -trouble passing urine or change in the amount of urine Side effects that usually do not require medical attention (report to your doctor or health care professional if they continue or are bothersome): -hair loss -loss of appetite -metallic taste in the mouth or changes in taste This list may not describe all possible side effects. Call your doctor for medical advice about side effects. You may report side effects to FDA at 1-800-FDA-1088. Where should I keep my medicine? This drug is given in a hospital or clinic and will not be stored at home. NOTE: This sheet is a summary. It may not cover all possible information. If you have questions about this medicine, talk to your doctor, pharmacist, or health care provider.  2019 Elsevier/Gold Standard (2007-04-03 14:38:05)

## 2018-02-09 NOTE — Assessment & Plan Note (Signed)
Overall, she has responded well to chemotherapy with near complete resolution of her symptoms of nausea, abdominal bloating and pain Tumor marker is improving She has no detectable ascites on exam We will proceed with cycle 2 of neoadjuvant chemotherapy as scheduled My plan would be to repeat imaging study after cycle 3 of therapy

## 2018-02-09 NOTE — Assessment & Plan Note (Signed)
She does not have detectable atrial fibrillation on exam today She will continue medical management and anticoagulation as directed

## 2018-02-09 NOTE — Assessment & Plan Note (Signed)
The patient has been complaining extensively about her right lower extremity pain which I believe is unrelated to her cancer pain The patient has significant poor quality of life due to this pain and it was unresolved with over-the-counter analgesics We discussed extensively about chronic pain management We discussed narcotic refill policy Per patient request, I have sent a new prescription to her local pharmacy

## 2018-02-09 NOTE — Patient Outreach (Signed)
[facility]) Care Management  [place]  02/09/2018  Haley [name] December 27, 1945 [id number]   Reason for call: medication management  Unsuccessful telephone call attempt #1 to patient.   HIPAA compliant voicemail left requesting a return call  Plan:  I will make another outreach attempt to patient within 3-4 business days  I will mail unsuccessful letter   Regina Eck, PharmD, [place]  [phone]

## 2018-02-09 NOTE — Telephone Encounter (Signed)
Jacqlyn Larsen, nurse with [facility] called and left a message requesting list of chemo and premed medication.  Called back and given list. She verbalized understanding.

## 2018-02-10 ENCOUNTER — Other Ambulatory Visit: Payer: Self-pay | Admitting: Internal Medicine

## 2018-02-10 DIAGNOSIS — I10 Essential (primary) hypertension: Secondary | ICD-10-CM

## 2018-02-10 DIAGNOSIS — I480 Paroxysmal atrial fibrillation: Secondary | ICD-10-CM

## 2018-02-10 NOTE — Progress Notes (Signed)
Patient called to request a refill of her diltiazem as she was only given 8 days worth of medicine previously. I called in diltiazem 30mg  Q6H 120 tablets no refills to her [facility] in [place].

## 2018-02-12 ENCOUNTER — Telehealth: Payer: Self-pay | Admitting: Cardiovascular Disease

## 2018-02-12 ENCOUNTER — Other Ambulatory Visit: Payer: Self-pay

## 2018-02-12 ENCOUNTER — Other Ambulatory Visit: Payer: Self-pay | Admitting: *Deleted

## 2018-02-12 ENCOUNTER — Ambulatory Visit: Payer: Self-pay

## 2018-02-12 DIAGNOSIS — I10 Essential (primary) hypertension: Secondary | ICD-10-CM | POA: Diagnosis not present

## 2018-02-12 DIAGNOSIS — C786 Secondary malignant neoplasm of retroperitoneum and peritoneum: Secondary | ICD-10-CM | POA: Diagnosis not present

## 2018-02-12 DIAGNOSIS — C569 Malignant neoplasm of unspecified ovary: Secondary | ICD-10-CM | POA: Diagnosis not present

## 2018-02-12 DIAGNOSIS — I480 Paroxysmal atrial fibrillation: Secondary | ICD-10-CM | POA: Diagnosis not present

## 2018-02-12 DIAGNOSIS — R180 Malignant ascites: Secondary | ICD-10-CM | POA: Diagnosis not present

## 2018-02-12 MED ORDER — SIMVASTATIN 20 MG PO TABS: 20.0000 mg | ORAL_TABLET | Freq: Every day | ORAL | 0 refills | Status: DC

## 2018-02-12 NOTE — Telephone Encounter (Signed)
Returned patient call, she states that she needs an appointment to discuss medications she was placed on at the hospital. Advised her that [facility] was in the hospital this week, and his appointments for any sooner was booked. I advised patient she was able to see a PA this week that works with [facility] and his care team. Patient agreed to see Almyra Deforest, PA on Thursday to discuss her issues and questions from hospital visit.

## 2018-02-12 NOTE — Telephone Encounter (Signed)
New message     Pt stated that she just got out of hospital and needs refills and updates on medications she should take. Pt wants to be seen  sooner than march. Pt declined seeing PA . Please follow up

## 2018-02-13 ENCOUNTER — Telehealth: Payer: Self-pay | Admitting: Family Medicine

## 2018-02-13 ENCOUNTER — Other Ambulatory Visit: Payer: Self-pay | Admitting: *Deleted

## 2018-02-13 DIAGNOSIS — I10 Essential (primary) hypertension: Secondary | ICD-10-CM

## 2018-02-13 DIAGNOSIS — I480 Paroxysmal atrial fibrillation: Secondary | ICD-10-CM

## 2018-02-13 DIAGNOSIS — K219 Gastro-esophageal reflux disease without esophagitis: Secondary | ICD-10-CM

## 2018-02-13 MED ORDER — FAMOTIDINE 40 MG PO TABS: 40.0000 mg | ORAL_TABLET | Freq: Every day | ORAL | 0 refills | Status: DC

## 2018-02-13 MED ORDER — METOPROLOL SUCCINATE ER 50 MG PO TB24: 50.0000 mg | ORAL_TABLET | Freq: Every day | ORAL | 0 refills | Status: DC

## 2018-02-13 MED ORDER — DILTIAZEM HCL 30 MG PO TABS: 30.0000 mg | ORAL_TABLET | Freq: Four times a day (QID) | ORAL | 0 refills | Status: DC

## 2018-02-13 NOTE — Telephone Encounter (Signed)
Copied from [place] [id number]. Topic: Quick Communication - Rx Refill/Question >> Feb 13, 2018  2:51 PM Rayann Heman wrote: Medication: famotidine (PEPCID) 40 MG tablet [[id number]] diltiazem (CARDIZEM) 30 MG tablet [[id number]] metoprolol succinate (TOPROL-XL) 50 MG 24 hr tablet [[id number]]([facility] like to confirm that it is indeed changed to 50 MG) could we refill all for 90 day supply   Has the patient contacted their pharmacy? Preferred Pharmacy (with phone number or street name):[facility]) - [place], [place] [phone] (Phone) [phone] (Fax)  Agent: Please be advised that RX refills may take up to 3 business days. We ask that you follow-up with your pharmacy.

## 2018-02-13 NOTE — Telephone Encounter (Signed)
90 day supply sent to pharmacy as requested

## 2018-02-13 NOTE — Patient Outreach (Signed)
[facility]) [place] [birth year] [id number]   Referral received from [facility] for complex case management.   Successful outreach with Mrs. Hetland. Member reported feeling well but "tired" at the time of the call. Discussed reason for referral and [facility] services. Member agreeable to further discussion and joint visit with RNCM and [facility] Pharmacist on next week. RNCM contact information provided. Member denied urgent concerns and agreed to contact if needed for questions prior to home visit.  PLAN Will follow up with [facility] Pharmacist. Will complete initial assessment on 02/21/18.   [place] [phone]

## 2018-02-14 ENCOUNTER — Encounter: Payer: Self-pay | Admitting: Obstetrics

## 2018-02-14 ENCOUNTER — Ambulatory Visit: Payer: Self-pay | Admitting: Pharmacist

## 2018-02-14 ENCOUNTER — Inpatient Hospital Stay: Payer: PPO | Attending: Obstetrics | Admitting: Obstetrics

## 2018-02-14 VITALS — BP 140/66 | HR 64 | Temp 98.1°F | Resp 20 | Ht 62.0 in | Wt 128.7 lb

## 2018-02-14 DIAGNOSIS — R1900 Intra-abdominal and pelvic swelling, mass and lump, unspecified site: Secondary | ICD-10-CM

## 2018-02-14 DIAGNOSIS — I480 Paroxysmal atrial fibrillation: Secondary | ICD-10-CM | POA: Insufficient documentation

## 2018-02-14 DIAGNOSIS — Z7901 Long term (current) use of anticoagulants: Secondary | ICD-10-CM | POA: Insufficient documentation

## 2018-02-14 DIAGNOSIS — C801 Malignant (primary) neoplasm, unspecified: Secondary | ICD-10-CM | POA: Insufficient documentation

## 2018-02-14 DIAGNOSIS — Z79899 Other long term (current) drug therapy: Secondary | ICD-10-CM | POA: Insufficient documentation

## 2018-02-14 DIAGNOSIS — D6481 Anemia due to antineoplastic chemotherapy: Secondary | ICD-10-CM | POA: Insufficient documentation

## 2018-02-14 DIAGNOSIS — Z5111 Encounter for antineoplastic chemotherapy: Secondary | ICD-10-CM | POA: Insufficient documentation

## 2018-02-14 DIAGNOSIS — C569 Malignant neoplasm of unspecified ovary: Secondary | ICD-10-CM

## 2018-02-14 DIAGNOSIS — C786 Secondary malignant neoplasm of retroperitoneum and peritoneum: Secondary | ICD-10-CM | POA: Insufficient documentation

## 2018-02-14 NOTE — Patient Instructions (Addendum)
Return in early March to meet Dr. Skeet Latch or Dr. Denman George. We will be working on planning your surgery in mid-March. Continue chemotherapy per Dr. Alvy Bimler. I will order a genetic consultation.

## 2018-02-14 NOTE — Progress Notes (Unsigned)
[place] at [facility]   Progress Note: Established Patient Follow-Up Visit   Consult was originally requested by Dr. Donalynn Furlong for pelvic mass/carcinomatosis concerning for ovarian cancer   Chief Complaint  Patient presents with  . Ovarian Cancer    GYN Oncologic Summary 1. Stage "X", suspect Stage 3C o .  HPI: [name]  is a nice 73 y.o.  P2   Interval History She returns to the office for follow-up having now undergone 2 cycles of neoadjuvant chemotherapy. She is feeling much better and her bowels have normalized.  Since her last office visit she was admitted for a partial SBO and Dr. Alvy Bimler was able to help [place] and give her the first cycle of chemotherapy in the hospital. She had several days of admission but slowly improved.  She had an initial paracentesis but did not need a repeat. Cytology from that showed 01/15/18 PERITONEAL/ASCITIC FLUID (SPECIMEN 1 OF 1 COLLECTED 01/18/18): MALIGNANT CELLS CONSISTENT WITH METASTATIC ADENOCARCINOMA. SEE COMMENT. COMMENT: THE MALIGNANT CELLS ARE POSITIVE FOR MOC-31, CYTOKERATIN 7, ESTROGEN RECEPTOR, PAX-8, AND WT-1. THEY ARE NEGATIVE FOR CALRETININ, CYTOKERATIN 5/6, AND CYTOKERATIN 20. THE PROFILE IS CONSISTENT WITH A PRIMARY GYNECOLOGIC CARCINOMA. THERE IS LIKELY SUFFICIENT TUMOR PRESENT, IF ADDITIONAL STUDIES ARE REQUESTED.  Notes some arthralgias but overall feels well. We discussed today her improving CA125.   Oncologic Course She has had urinary incontinence about 1 year. This started worsening in Sept 2019. She saw urology and was told she was "backed up" in her words. She was given a medication for the incontinence and associates the use of this medication with the start of constipation. Thinking it was the medication she tried to change her diet to help. When the second week came around she started noticing back pain. By the 3rd week this was fairly severe. After discussing  with her PCP she was started on Miralax and colace and mag citrate x 1. This helped with her BM but she was starting to feel swollen in her abdomen.   On 01/06/18 she felt significant pain and cramps and due to pain went to [place]. Imaging revealed a pelvic mass and carcinomatosis concerning for ovarian cancer.   01/06/18 CT notes: IMPRESSION: 1. Complex cystic mass at the right adnexa, measuring 5.9 x 4.0 cm, with nodular components, concerning for primary ovarian malignancy. 2. Diffuse nodularity along the omentum at the left side of the abdomen, extending into the mesentery at the left mid abdomen, concerning for peritoneal carcinomatosis. 3. Wall thickening at the distal ileum adjacent to the ovarian mass; bowel loops appear somewhat adherent to the ovarian mass. Bowel infiltration with tumor cannot be excluded. No evidence of bowel obstruction at this time. 4. Small volume ascites within the abdomen and pelvis.  On 12/30 +/- she had some nausea/emesis. She currently denies nausea. She is passing small amounts of flatus but wishes it was more to help relieve some of her "gas pain". She had a BM day before yesterday. She has had no appetite for months. Her daughter feels the patient has lost weight in the past 6 months, however the patient denies this. She does note early satiety. States her urinary incontinence is not as significant as it was in September, but admits to decrease po intake.  She followed up with Dr.Fontaine, who upon reviewing her case encouraged her to followup here in our office, also out of concern for ovarian cancer. CA125 was drawn with Dr. Phineas Real  and is quite elevated at 3004    Imported EPIC Oncologic History:    Ovarian cancer ([place]) suspected   01/06/2018 Imaging    Ct abdomen and pelvis 1. Complex cystic mass at the right adnexa, measuring 5.9 x 4.0 cm, with nodular components, concerning for primary ovarian malignancy. 2. Diffuse nodularity along the omentum  at the left side of the abdomen, extending into the mesentery at the left mid abdomen, concerning for peritoneal carcinomatosis. 3. Wall thickening at the distal ileum adjacent to the ovarian mass; bowel loops appear somewhat adherent to the ovarian mass. Bowel infiltration with tumor cannot be excluded. No evidence of bowel obstruction at this time. 4. Small volume ascites within the abdomen and pelvis.  Aortic Atherosclerosis (ICD10-I70.0).    01/08/2018 Tumor Marker    Patient's tumor was tested for the following markers: CA-125. Results of the tumor marker test revealed 3004    01/15/2018 Imaging    Chest CT:  1. No active cardiopulmonary disease. 2. Aortic atherosclerosis without aneurysm or dissection. 3. No large central pulmonary embolus.  CT AP:  1. Dilated fluid-filled loops of small bowel are redemonstrated slightly more extensive than on prior exam with transition point likely in the right adnexa adjacent to a complex cystic mass concerning for ovarian neoplasm given septations and soft tissue nodularity. This raises concern for early or partial SBO. This soft tissue mass measures 4.9 x 4 x 4.7 cm and has not changed since prior recent comparison. Additional short segmental area of luminal narrowing is noted in the right lower quadrant involving small bowel for which stigmata of peritoneal carcinomatosis or small-bowel metastatic implants might account for this. 2. Redemonstration of small volume of ascites predominantly in the upper abdomen surrounding the liver and spleen. 3. Redemonstration of thick bandlike omental thickening concerning for peritoneal carcinomatosis.     01/15/2018 Procedure    Successful ultrasound-guided diagnostic and therapeutic paracentesis yielding 1.5 liters of peritoneal fluid.     01/15/2018 Pathology Results    PERITONEAL/ASCITIC FLUID (SPECIMEN 1 OF 1 COLLECTED 01/18/18): MALIGNANT CELLS CONSISTENT WITH METASTATIC ADENOCARCINOMA. SEE  COMMENT. COMMENT: THE MALIGNANT CELLS ARE POSITIVE FOR MOC-31, CYTOKERATIN 7, ESTROGEN RECEPTOR, PAX-8, AND WT-1. THEY ARE NEGATIVE FOR CALRETININ, CYTOKERATIN 5/6, AND CYTOKERATIN 20. THE PROFILE IS CONSISTENT WITH A PRIMARY GYNECOLOGIC CARCINOMA. THERE IS LIKELY SUFFICIENT TUMOR PRESENT, IF ADDITIONAL STUDIES ARE REQUESTED.    01/15/2018 - 02/02/2018 Hospital Admission    She was admitted to the hospital for SBO. She was treated with chemotherapy    01/18/2018 -  Chemotherapy    The patient had carboplatin and taxol    02/08/2018 Cancer Staging    Staging form: Ovary, Fallopian Tube, and Primary Peritoneal Carcinoma, AJCC 8th Edition - Clinical: cT3, cN0, cM0 - Signed by Heath Lark, MD on 02/08/2018    02/08/2018 Tumor Marker    Patient's tumor was tested for the following markers: CA-125. Results of the tumor marker test revealed 445     Measurement of disease: CA125 . 01/08/18 = 3004  Recent Labs    01/08/18 1031 02/08/18 0937  CA125 3,004*  --   [id number]  --  445.0*   Radiology: Dg Chest 2 View  Result Date: 01/30/2018 CLINICAL DATA:  Cough and congestion EXAM: CHEST - 2 VIEW COMPARISON:  01/15/2018 FINDINGS: Cardiac shadows within normal limits. New right chest wall port is noted in satisfactory position. The lungs are well aerated bilaterally. Minimal scarring is noted in the re- left lung base stable from the  recent CT. No focal confluent infiltrate is seen. Minimal left pleural effusion is noted. IMPRESSION: New minimal left pleural effusion. No other acute abnormality is noted. Electronically Signed   By: Inez Catalina M.D.   On: 01/30/2018 09:26   Dg Abd 1 View  Result Date: 01/23/2018 CLINICAL DATA:  Nausea, vomiting, nasogastric tube placement EXAM: ABDOMEN - 1 VIEW COMPARISON:  Portable exam 1509 hours compared to 01/23/2018 at 1110 hours FINDINGS: Nasogastric tube tip projects over distal gastric antrum. Dilated gas-filled large and small bowel loops in the upper abdomen.  LEFT basilar atelectasis and tiny LEFT pleural effusion. Atherosclerotic calcification aorta. Port-A-Cath tip projects over SVC. No acute osseous findings. IMPRESSION: Persistent gaseous distention of bowel loops in the upper abdomen. Tip of nasogastric tube projects over distal gastric antrum. LEFT basilar atelectasis and tiny LEFT pleural effusion. Electronically Signed   By: Lavonia Dana M.D.   On: 01/23/2018 16:01   Ct Chest W Contrast  Result Date: 01/15/2018 CLINICAL DATA:  Abdominal distention with no bowel movements. Radiographs demonstrated small-bowel obstruction or ileus. History of hysterectomy and appendectomy. EXAM: CT CHEST, ABDOMEN, AND PELVIS WITH CONTRAST TECHNIQUE: Multidetector CT imaging of the chest, abdomen and pelvis was performed following the standard protocol during bolus administration of intravenous contrast. CONTRAST:  118mL ISOVUE-300 IOPAMIDOL (ISOVUE-300) INJECTION 61% COMPARISON:  Radiographs of the abdomen from 01/15/2018, CT abdomen pelvis 01/06/2018 FINDINGS: CT CHEST FINDINGS Cardiovascular: Conventional branch pattern of the great vessels with mild calcific atherosclerosis. No aneurysm or dissection. The thoracic aorta is nonaneurysmal demonstrates mild atherosclerosis at the arch and along the descending portion. Heart size is top normal without pericardial effusion or thickening. No significant coronary arteriosclerosis. No large central pulmonary embolus. Mediastinum/Nodes: No enlarged mediastinal, hilar, or axillary lymph nodes. The thyroid gland is excluded on this study. Patent trachea and mainstem bronchi. Unremarkable CT appearance of the thoracic esophagus. Lungs/Pleura: Linear atelectasis and/or scarring is noted in the left lower lobe. No acute pulmonary consolidation or dominant mass. No pneumothorax is noted. Musculoskeletal: No chest wall mass or suspicious bone lesions identified. CT ABDOMEN PELVIS FINDINGS Hepatobiliary: Homogeneous enhancement of the liver.  Small left hepatic lobe cyst measuring approximately 1 cm, series 2/42. The gallbladder is distended without stones possibly from a fasting state. No mural thickening or pericholecystic fluid. Small amount perihepatic ascites is identified. Slight intrahepatic ductal dilatation may be due to fullness of the gallbladder. Pancreas: Coarse calcification noted in the body of the pancreas. No acute inflammatory process, mass or ductal dilatation. Spleen: Normal size spleen without mass. Adrenals/Urinary Tract: Normal bilateral adrenal glands and symmetric cortical enhancement of both kidneys. Delayed imaging through both kidneys demonstrate symmetric pyelograms without obstructive uropathy. The urinary bladder is physiologically distended and unremarkable for the degree of distention. Stomach/Bowel: Small hiatal hernia is identified. The stomach is nondistended. The duodenal sweep and ligament of Treitz is unremarkable. Fluid-filled distended jejunal loops to 4.1 cm are identified with transition point suggested in the right lower quadrant, series 2, image 98. Additionally there is a short segmental area of luminal narrowing with mural thickening possibly representing small stricture or involvement by neoplasm is not excluded, series 2/89 measuring up to 1.7 cm in length with single wall thickness of up to 6 mm. The transition point appears to be adjacent to the right ovarian complex cystic mass and may be adherent as previously suggested on prior report. The patient is status post appendectomy. The colon is relatively decompressed in appearance. Vascular/Lymphatic: Patent portal and splenic veins. Moderate  aortic iliac atherosclerosis without aneurysm. Lymphadenopathy identified by CT size criteria. Reproductive: Complex cystic mass with septations and possible soft tissue components along the caudal aspect is identified in the right adnexa measuring 4.9 x 4 x 4.7 cm (AP by transverse by craniocaudad). Findings are  concerning for possible ovarian neoplasm. Further correlation with MRI is recommended. A more simple appearing left adnexal cyst measuring 5.3 x 3.8 x 4.5 cm is identified. The patient is status post hysterectomy. Other: Small volume of ascites is noted in the upper abdomen. There appears to be rind like thickening of the peritoneum concerning for peritoneal caking, series 2/61 in the left upper quadrant in particular concerning for peritoneal carcinomatosis. Musculoskeletal: No acute or significant osseous findings. IMPRESSION: Chest CT: 1. No active cardiopulmonary disease. 2. Aortic atherosclerosis without aneurysm or dissection. 3. No large central pulmonary embolus. CT AP: 1. Dilated fluid-filled loops of small bowel are redemonstrated slightly more extensive than on prior exam with transition point likely in the right adnexa adjacent to a complex cystic mass concerning for ovarian neoplasm given septations and soft tissue nodularity. This raises concern for early or partial SBO. This soft tissue mass measures 4.9 x 4 x 4.7 cm and has not changed since prior recent comparison. Additional short segmental area of luminal narrowing is noted in the right lower quadrant involving small bowel for which stigmata of peritoneal carcinomatosis or small-bowel metastatic implants might account for this. 2. Redemonstration of small volume of ascites predominantly in the upper abdomen surrounding the liver and spleen. 3. Redemonstration of thick bandlike omental thickening concerning for peritoneal carcinomatosis. Electronically Signed   By: Ashley Royalty M.D.   On: 01/15/2018 19:22   Ct Abdomen Pelvis W Contrast  Result Date: 01/15/2018 CLINICAL DATA:  Abdominal distention with no bowel movements. Radiographs demonstrated small-bowel obstruction or ileus. History of hysterectomy and appendectomy. EXAM: CT CHEST, ABDOMEN, AND PELVIS WITH CONTRAST TECHNIQUE: Multidetector CT imaging of the chest, abdomen and pelvis was  performed following the standard protocol during bolus administration of intravenous contrast. CONTRAST:  111mL ISOVUE-300 IOPAMIDOL (ISOVUE-300) INJECTION 61% COMPARISON:  Radiographs of the abdomen from 01/15/2018, CT abdomen pelvis 01/06/2018 FINDINGS: CT CHEST FINDINGS Cardiovascular: Conventional branch pattern of the great vessels with mild calcific atherosclerosis. No aneurysm or dissection. The thoracic aorta is nonaneurysmal demonstrates mild atherosclerosis at the arch and along the descending portion. Heart size is top normal without pericardial effusion or thickening. No significant coronary arteriosclerosis. No large central pulmonary embolus. Mediastinum/Nodes: No enlarged mediastinal, hilar, or axillary lymph nodes. The thyroid gland is excluded on this study. Patent trachea and mainstem bronchi. Unremarkable CT appearance of the thoracic esophagus. Lungs/Pleura: Linear atelectasis and/or scarring is noted in the left lower lobe. No acute pulmonary consolidation or dominant mass. No pneumothorax is noted. Musculoskeletal: No chest wall mass or suspicious bone lesions identified. CT ABDOMEN PELVIS FINDINGS Hepatobiliary: Homogeneous enhancement of the liver. Small left hepatic lobe cyst measuring approximately 1 cm, series 2/42. The gallbladder is distended without stones possibly from a fasting state. No mural thickening or pericholecystic fluid. Small amount perihepatic ascites is identified. Slight intrahepatic ductal dilatation may be due to fullness of the gallbladder. Pancreas: Coarse calcification noted in the body of the pancreas. No acute inflammatory process, mass or ductal dilatation. Spleen: Normal size spleen without mass. Adrenals/Urinary Tract: Normal bilateral adrenal glands and symmetric cortical enhancement of both kidneys. Delayed imaging through both kidneys demonstrate symmetric pyelograms without obstructive uropathy. The urinary bladder is physiologically distended and  unremarkable  for the degree of distention. Stomach/Bowel: Small hiatal hernia is identified. The stomach is nondistended. The duodenal sweep and ligament of Treitz is unremarkable. Fluid-filled distended jejunal loops to 4.1 cm are identified with transition point suggested in the right lower quadrant, series 2, image 98. Additionally there is a short segmental area of luminal narrowing with mural thickening possibly representing small stricture or involvement by neoplasm is not excluded, series 2/89 measuring up to 1.7 cm in length with single wall thickness of up to 6 mm. The transition point appears to be adjacent to the right ovarian complex cystic mass and may be adherent as previously suggested on prior report. The patient is status post appendectomy. The colon is relatively decompressed in appearance. Vascular/Lymphatic: Patent portal and splenic veins. Moderate aortic iliac atherosclerosis without aneurysm. Lymphadenopathy identified by CT size criteria. Reproductive: Complex cystic mass with septations and possible soft tissue components along the caudal aspect is identified in the right adnexa measuring 4.9 x 4 x 4.7 cm (AP by transverse by craniocaudad). Findings are concerning for possible ovarian neoplasm. Further correlation with MRI is recommended. A more simple appearing left adnexal cyst measuring 5.3 x 3.8 x 4.5 cm is identified. The patient is status post hysterectomy. Other: Small volume of ascites is noted in the upper abdomen. There appears to be rind like thickening of the peritoneum concerning for peritoneal caking, series 2/61 in the left upper quadrant in particular concerning for peritoneal carcinomatosis. Musculoskeletal: No acute or significant osseous findings. IMPRESSION: Chest CT: 1. No active cardiopulmonary disease. 2. Aortic atherosclerosis without aneurysm or dissection. 3. No large central pulmonary embolus. CT AP: 1. Dilated fluid-filled loops of small bowel are redemonstrated  slightly more extensive than on prior exam with transition point likely in the right adnexa adjacent to a complex cystic mass concerning for ovarian neoplasm given septations and soft tissue nodularity. This raises concern for early or partial SBO. This soft tissue mass measures 4.9 x 4 x 4.7 cm and has not changed since prior recent comparison. Additional short segmental area of luminal narrowing is noted in the right lower quadrant involving small bowel for which stigmata of peritoneal carcinomatosis or small-bowel metastatic implants might account for this. 2. Redemonstration of small volume of ascites predominantly in the upper abdomen surrounding the liver and spleen. 3. Redemonstration of thick bandlike omental thickening concerning for peritoneal carcinomatosis. Electronically Signed   By: Ashley Royalty M.D.   On: 01/15/2018 19:22   Ct Abdomen Pelvis W Contrast  Result Date: 01/06/2018 CLINICAL DATA:  Acute onset of generalized abdominal pain and cramping. Abdominal bloating. EXAM: CT ABDOMEN AND PELVIS WITH CONTRAST TECHNIQUE: Multidetector CT imaging of the abdomen and pelvis was performed using the standard protocol following bolus administration of intravenous contrast. CONTRAST:  180mL ISOVUE-300 IOPAMIDOL (ISOVUE-300) INJECTION 61% COMPARISON:  CT of the abdomen performed 12/11/2006, and abdominal ultrasound performed 06/11/2008 FINDINGS: Lower chest: The visualized lung bases are grossly clear. The visualized portions of the mediastinum are unremarkable. Hepatobiliary: The liver is unremarkable in appearance. The gallbladder is decompressed and not well assessed given surrounding ascites. The common bile duct is normal in caliber. Pancreas: The pancreas is within normal limits. Spleen: The spleen is unremarkable in appearance. Adrenals/Urinary Tract: The adrenal glands are unremarkable in appearance. The kidneys are unremarkable. There is no evidence of hydronephrosis. No renal or ureteral stones  are identified. No perinephric stranding is seen. Stomach/Bowel: There appears to be diffuse nodularity along the omentum at the left side of the abdomen,  extending into the mesentery at the left mid abdomen, concerning for peritoneal carcinomatosis. The small bowel is diffusely filled with fluid, but remains normal in caliber. Wall thickening is noted at the distal ileum adjacent to the ovarian mass described below, and bowel loops appear somewhat adherent to the ovarian mass. The stomach is unremarkable in appearance. The appendix is normal in caliber, without evidence of appendicitis. The colon is largely decompressed and grossly unremarkable in appearance. Vascular/Lymphatic: Scattered calcification is seen along the abdominal aorta and its branches. The abdominal aorta is otherwise grossly unremarkable. The inferior vena cava is grossly unremarkable. No retroperitoneal lymphadenopathy is seen. No pelvic sidewall lymphadenopathy is identified. Reproductive: The bladder is mildly distended and grossly unremarkable. The patient is status post hysterectomy. A multilobulated cystic mass is noted at the right adnexa, measuring approximately 5.9 x 4.0 cm, with nodular components, concerning for primary ovarian malignancy. Two cystic lesions are noted at the left adnexa, measuring 5.8 cm and 3.2 cm, of uncertain significance. Other: Small volume ascites is noted within the abdomen and pelvis. Musculoskeletal: No acute osseous abnormalities are identified. The visualized musculature is unremarkable in appearance. IMPRESSION: 1. Complex cystic mass at the right adnexa, measuring 5.9 x 4.0 cm, with nodular components, concerning for primary ovarian malignancy. 2. Diffuse nodularity along the omentum at the left side of the abdomen, extending into the mesentery at the left mid abdomen, concerning for peritoneal carcinomatosis. 3. Wall thickening at the distal ileum adjacent to the ovarian mass; bowel loops appear somewhat  adherent to the ovarian mass. Bowel infiltration with tumor cannot be excluded. No evidence of bowel obstruction at this time. 4. Small volume ascites within the abdomen and pelvis. Aortic Atherosclerosis (ICD10-I70.0). These results were called by telephone at the time of interpretation on 01/06/2018 at 2:56 am to Dr. Ezequiel Essex, who verbally acknowledged these results. Electronically Signed   By: Garald Balding M.D.   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 Request made for therapeutic paracentesis. EXAM: ULTRASOUND GUIDED THERAPEUTIC PARACENTESIS MEDICATIONS: None COMPLICATIONS: None immediate. PROCEDURE: Informed written consent was obtained from the patient after a discussion of the risks, benefits and alternatives to treatment. A timeout was performed prior to the initiation of the procedure. Initial ultrasound scanning demonstrates a small amount of ascites within the right mid to lower abdominal quadrant. The right mid to lower abdomen was prepped and draped in the usual sterile fashion. 1% lidocaine was used for local anesthesia. Following this, a 6 Fr Safe-T-Centesis catheter was introduced. An ultrasound image was saved for documentation purposes. The paracentesis was performed. The catheter was removed and a dressing was applied. The patient tolerated the procedure well without immediate post procedural complication. FINDINGS: A total of approximately 450 cc of hazy, yellow fluid was removed. IMPRESSION: Successful ultrasound-guided therapeutic paracentesis yielding 450 cc of peritoneal fluid. Read by: Rowe Robert, PA-C Electronically Signed   By: Aletta Edouard M.D.   On: 01/23/2018 12:19   US Paracentesis  Result Date: 01/15/2018 INDICATION: Patient with history of abdominal pain, complex right adnexal cystic mass, elevated CA-125, omental nodularity, ascites.  Request made for diagnostic and therapeutic paracentesis. EXAM: ULTRASOUND GUIDED DIAGNOSTIC AND THERAPEUTIC PARACENTESIS MEDICATIONS: None COMPLICATIONS: None immediate. PROCEDURE: Informed written consent was obtained from the patient after a discussion of the risks, benefits and alternatives to treatment. A timeout was performed prior to the initiation of the procedure. Initial ultrasound scanning  demonstrates a small amount of ascites within the right lower abdominal quadrant. The right lower abdomen was prepped and draped in the usual sterile fashion. 1% lidocaine was used for local anesthesia. Following this, a 19 gauge, 10-cm, Yueh catheter was introduced. An ultrasound image was saved for documentation purposes. The paracentesis was performed. The catheter was removed and a dressing was applied. The patient tolerated the procedure well without immediate post procedural complication. FINDINGS: A total of approximately 1.5 liters of hazy, yellow fluid was removed. Samples were sent to the laboratory as requested by the clinical team. IMPRESSION: Successful ultrasound-guided diagnostic and therapeutic paracentesis yielding 1.5 liters of peritoneal fluid. Read by: Rowe Robert, PA-C Electronically Signed   By: Jacqulynn Cadet M.D.   On: 01/15/2018 14:43   Dg Abd 2 Views  Result Date: 01/23/2018 CLINICAL DATA:  Follow-up small bowel obstruction EXAM: ABDOMEN - 2 VIEW COMPARISON:  CT abdomen 01/15/2017, KUB 01/17/2017 FINDINGS: There is gaseous distention of small bowel and colon. There are multiple small bowel air-fluid levels. There are a few colonic air-fluid levels in the ascending colon. There is no evidence of pneumoperitoneum, portal venous gas or pneumatosis. There are no pathologic calcifications along the expected course of the ureters. The osseous structures are unremarkable. IMPRESSION: 1. Gaseous distention of small bowel and colon with a few small bowel and colonic air-fluid levels. This  appearance can be seen with an ileus versus partial small bowel obstruction. Electronically Signed   By: Kathreen Devoid   On: 01/23/2018 11:26   Dg Abd 2 Views  Result Date: 01/17/2018 CLINICAL DATA:  Partial small bowel obstruction EXAM: ABDOMEN - 2 VIEW COMPARISON:  01/16/2018 FINDINGS: Dilated small bowel again noted with air-fluid levels compatible with small bowel obstruction. No real change. No organomegaly or free air. IMPRESSION: Continued small bowel obstruction pattern, unchanged. Electronically Signed   By: Rolm Baptise M.D.   On: 01/17/2018 11:26   Dg Abd 2 Views  Result Date: 01/16/2018 CLINICAL DATA:  73 y/o  F; small-bowel obstruction. EXAM: ABDOMEN - 2 VIEW COMPARISON:  01/15/2017 CT abdomen and pelvis. 01/15/2017 abdomen radiographs. FINDINGS: Stable diffuse small bowel obstruction. No pneumoperitoneum. No acute osseous abnormality is evident. IMPRESSION: Stable diffuse small bowel obstruction. Electronically Signed   By: Kristine Garbe M.D.   On: 01/16/2018 16:02   Dg Abd 2 Views  Result Date: 01/15/2018 CLINICAL DATA:  Acute generalized abdominal pain and distention. History of ovarian cancer. EXAM: ABDOMEN - 2 VIEW COMPARISON:  CT scan of January 06, 2018. FINDINGS: Mildly dilated small bowel loops are noted concerning for distal small bowel obstruction or ileus. No colonic dilatation is noted. There is no evidence of free air. No radio-opaque calculi or other significant radiographic abnormality is seen. IMPRESSION: Mildly dilated small bowel loops are noted concerning for distal small bowel obstruction or possibly ileus. Electronically Signed   By: Marijo Conception, M.D.   On: 01/15/2018 14:25   Dg Abd Portable 1v  Result Date: 01/25/2018 CLINICAL DATA:  Followup small bowel obstruction. EXAM: PORTABLE ABDOMEN - 1 VIEW COMPARISON:  01/23/2018.  Abdomen and pelvis CT dated 01/15/2018. FINDINGS: Nasogastric tube tip and side hole in the stomach. Mildly decreased gas distended  small bowel loops and mildly increased gas distended loops colon. Stool and gas in normal caliber rectum. Unremarkable bones. IMPRESSION: Mildly improved pattern of partial small bowel obstruction with mildly increased colonic ileus or partial obstruction. Electronically Signed   By: Claudie Revering M.D.   On: 01/25/2018  19:55   Mm 3d Screen Breast Bilateral  Result Date: 12/18/2017 CLINICAL DATA:  Screening. EXAM: DIGITAL SCREENING BILATERAL MAMMOGRAM WITH TOMO AND CAD COMPARISON:  Previous exam(s). ACR Breast Density Category c: The breast tissue is heterogeneously dense, which may obscure small masses. FINDINGS: There are no findings suspicious for malignancy. Images were processed with CAD. IMPRESSION: No mammographic evidence of malignancy. A result letter of this screening mammogram will be mailed directly to the patient. RECOMMENDATION: Screening mammogram in one year. (Code:SM-B-01Y) BI-RADS CATEGORY  1: Negative. Electronically Signed   By: Abelardo Diesel M.D.   On: 12/18/2017 10:57   Ir Imaging Guided Port Insertion  Result Date: 01/17/2018 CLINICAL DATA:  OVARIAN CARCINOMA, PERITONEAL CARCINOMATOSIS, SMALL-BOWEL OBSTRUCTION EXAM: RIGHT INTERNAL JUGULAR SINGLE LUMEN POWER PORT CATHETER INSERTION Date:  01/17/2018 01/17/2018 1:28 pm Radiologist:  Jerilynn Mages. Daryll Brod, MD Guidance:  Ultrasound fluoroscopic MEDICATIONS: Ancef 2 g; The antibiotic was administered within an appropriate time interval prior to skin puncture. ANESTHESIA/SEDATION: Versed 1.0 mg IV; Fentanyl 50 mcg IV; Moderate Sedation Time:  20 minutes The patient was continuously monitored during the procedure by the interventional radiology nurse under my direct supervision. FLUOROSCOPY TIME:  0 minutes, 18 seconds (3 mGy) COMPLICATIONS: None immediate. CONTRAST:  None PROCEDURE: Informed consent was obtained from the patient following explanation of the procedure, risks, benefits and alternatives. The patient understands, agrees and consents for the  procedure. All questions were addressed. A time out was performed. Maximal barrier sterile technique utilized including caps, mask, sterile gowns, sterile gloves, large sterile drape, hand hygiene, and 2% chlorhexidine scrub. Under sterile conditions and local anesthesia, right internal jugular micropuncture venous access was performed. Access was performed with ultrasound. Images were obtained for documentation of the patent right internal jugular vein. A guide wire was inserted followed by a transitional dilator. This allowed insertion of a guide wire and catheter into the IVC. Measurements were obtained from the SVC / RA junction back to the right IJ venotomy site. In the right infraclavicular chest, a subcutaneous pocket was created over the second anterior rib. This was done under sterile conditions and local anesthesia. 1% lidocaine with epinephrine was utilized for this. A 2.5 cm incision was made in the skin. Blunt dissection was performed to create a subcutaneous pocket over the right pectoralis major muscle. The pocket was flushed with saline vigorously. There was adequate hemostasis. The port catheter was assembled and checked for leakage. The port catheter was secured in the pocket with two retention sutures. The tubing was tunneled subcutaneously to the right venotomy site and inserted into the SVC/RA junction through a valved peel-away sheath. Position was confirmed with fluoroscopy. Images were obtained for documentation. The patient tolerated the procedure well. No immediate complications. Incisions were closed in a two layer fashion with 4 - 0 Vicryl suture. Dermabond was applied to the skin. The port catheter was accessed, blood was aspirated followed by saline and heparin flushes. Needle was removed. A dry sterile dressing was applied. IMPRESSION: Ultrasound and fluoroscopically guided right internal jugular single lumen power port catheter insertion. Tip in the SVC/RA junction. Catheter ready for  use. Electronically Signed   By: Jerilynn Mages.  Shick M.D.   On: 01/17/2018 13:32   Vas [place] Lower Extremity Venous (dvt)  Result Date: 02/02/2018  Lower Venous Study Indications: Swelling, and Pain.  Risk Factors: Cancer Ovarian Cancer, Peritoneal carcinomatosis. Limitations: Body habitus. Comparison Study: R[facility] exam on 07/27/2016. Performing Technologist: Rudell Cobb  Examination Guidelines: A complete evaluation includes B-mode imaging, spectral  Doppler, color Doppler, and power Doppler as needed of all accessible portions of each vessel. Bilateral testing is considered an integral part of a complete examination. Limited examinations for reoccurring indications may be performed as noted.  Right Venous Findings: +---------+---------------+---------+-----------+----------+-------+          CompressibilityPhasicitySpontaneityPropertiesSummary +---------+---------------+---------+-----------+----------+-------+ CFV      Full           No       Yes                          +---------+---------------+---------+-----------+----------+-------+ SFJ      Full                                                 +---------+---------------+---------+-----------+----------+-------+ FV Prox  Full                                                 +---------+---------------+---------+-----------+----------+-------+ FV Mid   Full                                                 +---------+---------------+---------+-----------+----------+-------+ FV DistalFull                                                 +---------+---------------+---------+-----------+----------+-------+ PFV      Full                                                 +---------+---------------+---------+-----------+----------+-------+ POP      Full           No       Yes                  Dilated +---------+---------------+---------+-----------+----------+-------+ PTV      Full                                                  +---------+---------------+---------+-----------+----------+-------+ PERO     Full                                                 +---------+---------------+---------+-----------+----------+-------+  Left Venous Findings: +---------+---------------+---------+-----------+----------+-------+          CompressibilityPhasicitySpontaneityPropertiesSummary +---------+---------------+---------+-----------+----------+-------+ CFV      Full           Yes      Yes                          +---------+---------------+---------+-----------+----------+-------+ SFJ  Full                                                 +---------+---------------+---------+-----------+----------+-------+ FV Prox  Full                                                 +---------+---------------+---------+-----------+----------+-------+ FV Mid   Full                                                 +---------+---------------+---------+-----------+----------+-------+ FV DistalFull                                                 +---------+---------------+---------+-----------+----------+-------+ PFV      Full                                                 +---------+---------------+---------+-----------+----------+-------+ POP      Full           No       Yes                          +---------+---------------+---------+-----------+----------+-------+ PTV      Full                                                 +---------+---------------+---------+-----------+----------+-------+ PERO     Full                                                 +---------+---------------+---------+-----------+----------+-------+    Summary: Right: There is no evidence of deep vein thrombosis in the lower extremity. However, the CFV and Popliteal vein are lack of phasicity. No cystic structure found in the popliteal fossa. Left: There is no evidence of deep vein thrombosis in the lower  extremity. Noticed the popliteal vein is lack of phasicity. No cystic structure found in the popliteal fossa.  *See table(s) above for measurements and observations. Electronically signed by Servando Snare MD on 02/02/2018 at 12:09:30 PM.    Final    .   Outpatient Encounter Medications as of 02/14/2018  Medication Sig  . ALPRAZolam (XANAX) 0.5 MG tablet TAKE ONE TABLET BY MOUTH ONCE DAILY AS NEEDED (Patient taking differently: Take 0.25-0.5 mg by mouth daily as needed for anxiety. TAKE ONE TABLET BY MOUTH ONCE DAILY AS NEEDED)  . Calcium Carbonate (CALTRATE 600) 1500 MG TABS Take by mouth daily.   . cholecalciferol (VITAMIN D) 1000 UNITS tablet Take 1,000 Units by mouth daily.   Marland Kitchen dexamethasone (DECADRON)  4 MG tablet Take 2 tabs at the night before and 2 tabs the morning of chemotherapy, every 3 weeks, by mouth  . diltiazem (CARDIZEM) 30 MG tablet Take 1 tablet (30 mg total) by mouth every 6 (six) hours.  . famotidine (PEPCID) 40 MG tablet Take 1 tablet (40 mg total) by mouth daily.  . fluticasone (CUTIVATE) 0.05 % cream Apply 1 application topically at bedtime.   . fluticasone (FLONASE) 50 MCG/ACT nasal spray Use 2 sprays in each nostril daily  . ibuprofen (ADVIL,MOTRIN) 200 MG tablet Take 600 mg by mouth daily as needed for moderate pain.  Marland Kitchen lactose free nutrition (BOOST PLUS) LIQD Take 237 mLs by mouth daily.  Marland Kitchen levothyroxine (SYNTHROID, LEVOTHROID) 75 MCG tablet Take 1 tablet (75 mcg total) by mouth daily.  Marland Kitchen lidocaine-prilocaine (EMLA) cream Apply 1 application topically as needed.  . Menthol-Methyl Salicylate (MUSCLE RUB) 10-15 % CREA Apply 1 application topically as needed for muscle pain.  . metoprolol succinate (TOPROL-XL) 50 MG 24 hr tablet Take 1 tablet (50 mg total) by mouth daily. Take with or immediately following a meal.  . Multiple Vitamins-Minerals (MULTIVITAMIN WITH MINERALS) tablet Take 1 tablet by mouth daily.    . Omega-3 Fatty Acids (FISH OIL) 1000 MG CAPS Take 2 capsules by  mouth 2 (two) times daily.  . ondansetron (ZOFRAN) 8 MG tablet Take 1 tablet (8 mg total) by mouth every 8 (eight) hours as needed for nausea.  Marland Kitchen oxyCODONE (OXY IR/ROXICODONE) 5 MG immediate release tablet Take 1 tablet (5 mg total) by mouth every 4 (four) hours as needed for severe pain.  . polyethylene glycol (MIRALAX / GLYCOLAX) packet Take 17 g by mouth daily. (Patient taking differently: Take 17 g by mouth 2 (two) times daily. )  . rivaroxaban (XARELTO) 20 MG TABS tablet Take 1 tablet (20 mg total) by mouth daily with supper.  . simvastatin (ZOCOR) 20 MG tablet Take 1 tablet (20 mg total) by mouth at bedtime.  . vitamin C (ASCORBIC ACID) 500 MG tablet Take 500 mg by mouth 2 (two) times daily.   . prochlorperazine (COMPAZINE) 10 MG tablet Take 1 tablet (10 mg total) by mouth every 6 (six) hours as needed for nausea or vomiting. (Patient not taking: Reported on 02/14/2018)  . [DISCONTINUED] omeprazole (PRILOSEC) 40 MG capsule Take 1 capsule (40 mg total) by mouth daily. (Patient not taking: Reported on 02/14/2018)   No facility-administered encounter medications on file as of 02/14/2018.    Allergies  Allergen Reactions  . Tramadol Hcl     REACTION: jittery  . Tramadol Nausea And Vomiting    Past Medical History:  Diagnosis Date  . Allergy    SEASONAL  . Cataract    BILATERAL  . Fibromyalgia   . GERD (gastroesophageal reflux disease)   . H/O echocardiogram 04/28/08   EF>55% trace mitral regurgitation, No significant valvular pathology  . Hemorrhoids    internal  . History of stress test 02/08/2011   Normal Myocardial perfusion study, this is a low risk scan, No prior study available for comparison  . Hyperlipidemia   . MVP (mitral valve prolapse)    mild and MR  . OA (osteoarthritis)   . Osteoporosis   . Paroxysmal A-fib ([place])   . Sleep apnea    does not use prescribed CPAP  . Thyroid disease    HYPO  . Varicosities of leg   . Vitamin D deficiency    Past Surgical History:   Procedure Laterality Date  .  ABDOMINAL HYSTERECTOMY  1989  . APPENDECTOMY  1989   with hysterectomy  . BREAST EXCISIONAL BIOPSY Left 1995   Benign  . COLONOSCOPY  11-20-2000  . IR IMAGING GUIDED PORT INSERTION  01/17/2018  . NOSE SURGERY  1990        Past Gynecological History:   GYNECOLOGIC HISTORY:  . No LMP recorded. Patient has had a hysterectomy. 1987 . Menarche: 73 years old . P 2 . Contraceptive none . HRT none  . Last Pap NA Family Hx:  Family History  Problem Relation Age of Onset  . Diabetes Daughter   . Diabetes Mother   . Hypertension Mother   . Heart disease Mother   . Stroke Maternal Grandmother   . Cancer Niece        unsure breast or lung  . Colon cancer Neg Hx    Social Hx:  Marland Kitchen Tobacco use: 1/2 pack "cigars" daily . Alcohol use: none . Illicit Drug use: none . Illicit IV Drug use: none    Review of Systems: Review of Systems  Constitutional: Positive for chills, fever and unexpected weight change.  Cardiovascular: Positive for palpitations.  Gastrointestinal: Positive for abdominal distention and abdominal pain.  Musculoskeletal: Positive for arthralgias, back pain and myalgias.  All other systems reviewed and are negative.   Vitals:  Vitals:   02/14/18 1001  BP: 140/66  Pulse: 64  Resp: 20  Temp: 98.1 F (36.7 C)  SpO2: 100%   Vitals:   02/14/18 1001  Weight: 128 lb 11.2 oz (58.4 kg)  Height: 5\' 2"  (1.575 m)   Body mass index is 23.54 kg/m.  Physical Exam: General :  Well developed, 73 y.o., female in no apparent distress;  HEENT:  Normocephalic/atraumatic, symmetric, EOMI, eyelids normal. minimal muscle wasting of face. Neck:   Supple, no masses.  Lymphatics:  No cervical/ submandibular/ supraclavicular/ infraclavicular/ inguinal adenopathy Respiratory:  Respirations unlabored, no use of accessory muscles CV:   Deferred Breast:  Deferred Musculoskeletal: No CVA tenderness, normal muscle strength. Abdomen:  Soft, non-tender  and nondistended. No evidence of hernia. No masses. Extremities:  No lymphedema, no erythema, non-tender. Skin:   Normal inspection Neuro/Psych:  No focal motor deficit, no abnormal mental status. Normal gait. Normal affect. Alert and oriented to person, place, and time  Genito Urinary: 01/11/18 Vulva: Normal external female genitalia.  Bladder/urethra: Urethral meatus normal in size and location. No lesions or   masses, well supported bladder Cervix/Uterus: Surgically absent Bimanual exam:   Adnexal region: Palpable mass in culdesac firm and unable to mobilize out of pelvis. Seems to push somewhat posterior towards sacrum.  Rectovaginal:  Deferred by patient request due to pain/irritation  Assessment  Advanced ovarian cancer  Plan  1. Ovarian cancer  ? I have previously explained my concern this is advanced ovarian cancer ? We had cytology with IHC favoring ovarian primary ? Neoadjuvant chemotherapy has been initiated and she seems to be doing well with a decreasing CA125 and clinical improvement 2. Surgical discussion ? We discussed interval debulking briefly today. I told her one of my partners would be doing that surgery and recommended we look at mid-March for the procedure ? We did discuss given the partial SBO there is a higher than average risk of bowel resection but hopefully there will be a good response and the surgery will not require more radical procedures. Briefly we touched on (as always with ovarian debulking) a possibility of ostomy. ? I will have her return in early  March to meet the person who will operate on her and they can further counsel regarding the procedure.  3. We discussed genetics today and I placed an order.  Face to face time with patient was 25 minutes. Over 50% of this time was spent on counseling and coordination of care.   Mart Piggs, MD Gynecologic Oncologist 02/14/2018, 5:03 PM    Cc: Donalynn Furlong, MD (Referring Ob/Gyn) [place],  Judeth G, MD  (PCP)

## 2018-02-15 ENCOUNTER — Ambulatory Visit: Payer: PPO | Admitting: Physician Assistant

## 2018-02-15 ENCOUNTER — Other Ambulatory Visit: Payer: Self-pay | Admitting: Pharmacist

## 2018-02-15 NOTE — Patient Outreach (Addendum)
[facility]) Care Management  [place]   02/28/2018  Haley [name] [birth year] [id number]  Reason for referral: Med rec post discharge/ Medication Management/assistance  Referral source: [facility] RN Current insurance:[employer]  PMHx includes but not limited to:  Ovarian cancer, HTN, GERD, hypothyroidism, Afib, HLD  Outreach:  Successful telephone call with [name].  HIPAA identifiers verified.  Patient states she is having a rough time after hospitalization and treatment.  Patient is agreeable to review medications telephonically.  She denies changes to medications post discharge.  She denies adverse events from medications.  She states that she is having trouble affording Xarelto and is unsure of her anticoagulation plan.  Medication were reviewed and updated in [facility].  Will assist with PAP for Xarelto when able.  ASSESSMENT: Date Discharged from Hospital: 02/02/2018 Date Medication Reconciliation Performed: 02/15/2018  Medications:  New at Discharge: . n/a  Adjustments at Discharge: . n/a  Discontinued at Discharge:   n/a   Patient was recently discharged from hospital and all medications have been reviewed.   Objective: Lab Results  Component Value Date   CREATININE 0.75 02/08/2018   CREATININE 0.65 02/02/2018   CREATININE 0.59 02/01/2018    Lab Results  Component Value Date   HGBA1C 6.0 08/06/2012    Lipid Panel     Component Value Date/Time   CHOL 157 11/03/2017 1019   TRIG 154.0 (H) 11/03/2017 1019   HDL 56.00 11/03/2017 1019   CHOLHDL 3 11/03/2017 1019   VLDL 30.8 11/03/2017 1019   LDLCALC 70 11/03/2017 1019   LDLDIRECT 84.3 12/16/2010 0921    BP Readings from Last 3 Encounters:  02/27/18 (!) 144/72  02/21/18 132/68  02/19/18 120/72    Allergies  Allergen Reactions  . Tramadol Hcl     REACTION: jittery  . Tramadol Nausea And Vomiting    Medications Reviewed Today    Reviewed by Lavera Guise, [facility]  (Pharmacist) on 02/28/18 at 1431  Med List Status: <None>  Medication Order Taking? Sig Documenting Provider Last Dose Status Informant  ALPRAZolam (XANAX) 0.5 MG tablet [id number]  TAKE ONE TABLET BY MOUTH ONCE DAILY AS NEEDED  Patient taking differently:  Take 0.25-0.5 mg by mouth daily as needed for anxiety. TAKE ONE TABLET BY MOUTH ONCE DAILY AS NEEDED   Marletta Lor, MD  Active Self  amoxicillin (AMOXIL) 500 MG capsule [id number]  Take 1 capsule (500 mg total) by mouth 3 (three) times daily for 5 days. McDonald, Mia A, PA-C  Active   Calcium Carbonate (CALTRATE 600) 1500 MG TABS [id number]  Take by mouth daily.  [provider]  Active Self  Carboxymethylcellulose Sodium (EYE DROPS OP) [id number]  Apply 1 drop to eye daily as needed. Thera Tears [provider]  Active Self  cholecalciferol (VITAMIN D) 1000 UNITS tablet [id number]  Take 1,000 Units by mouth daily.  [provider]  Active Self  dexamethasone (DECADRON) 4 MG tablet [id number]  Take 2 tabs at the night before and 2 tabs the morning of chemotherapy, every 3 weeks, by mouth Heath Lark, MD  Active Self  fluticasone (FLONASE) 50 MCG/ACT nasal spray [id number] Yes Use 2 sprays in each nostril daily Marletta Lor, MD Taking Active Self  ibuprofen (ADVIL,MOTRIN) 200 MG tablet [id number] Yes Take 600 mg by mouth every 6 (six) hours as needed for moderate pain.  [provider] Taking Active Self  lactose free nutrition (BOOST PLUS) LIQD [id number] Yes Take 237 mLs  by mouth daily. Kayleen Memos, DO Taking Active Self  levothyroxine (SYNTHROID, LEVOTHROID) 75 MCG tablet [id number] Yes Take 1 tablet (75 mcg total) by mouth daily. Martinique, Elizabethann G, MD Taking Active Self  lidocaine-prilocaine (EMLA) cream [id number] Yes Apply 1 application topically as needed. Heath Lark, MD Taking Active Self  Menthol-Methyl Salicylate (MUSCLE RUB) 10-15 % CREA [id number] Yes Apply 1 application topically as needed for  muscle pain. [provider] Taking Active Self  metoprolol succinate (TOPROL-XL) 50 MG 24 hr tablet [id number] Yes Take 1 tablet (50 mg total) by mouth daily. Take with or immediately following a meal. Almyra Deforest, [place] Taking Active Self  Multiple Vitamins-Minerals (MULTIVITAMIN WITH MINERALS) tablet [id number] Yes Take 1 tablet by mouth daily.   [provider] Taking Active Self  Omega-3 Fatty Acids (FISH OIL) 1000 MG CAPS [id number] Yes Take 2 capsules by mouth 2 (two) times daily. [provider] Taking Active Self  omeprazole (PRILOSEC) 40 MG capsule [id number] Yes Take 40 mg by mouth daily. [provider] Taking Active Self  ondansetron (ZOFRAN) 8 MG tablet [id number] Yes Take 1 tablet (8 mg total) by mouth every 8 (eight) hours as needed for nausea. Heath Lark, MD Taking Active Self  oxyCODONE (OXY IR/ROXICODONE) 5 MG immediate release tablet [id number] Yes Take 1 tablet (5 mg total) by mouth every 4 (four) hours as needed for severe pain. Heath Lark, MD Taking Active Self  polyethylene glycol [facility] / GLYCOLAX) packet [id number] Yes Take 17 g by mouth daily.  Patient taking differently:  Take 17 g by mouth 2 (two) times daily.    Kayleen Memos, DO Taking Active Self  prochlorperazine (COMPAZINE) 10 MG tablet [id number] Yes Take 1 tablet (10 mg total) by mouth every 6 (six) hours as needed for nausea or vomiting. Heath Lark, MD Taking Active Self  rivaroxaban (XARELTO) 20 MG TABS tablet [id number] Yes Take 1 tablet (20 mg total) by mouth daily with supper. Erlene Quan, PA-C Taking Active Self  simvastatin (ZOCOR) 20 MG tablet [id number] Yes Take 1 tablet (20 mg total) by mouth at bedtime. Erlene Quan, PA-C Taking Active Self  vitamin C (ASCORBIC ACID) 500 MG tablet [id number] Yes Take 500 mg by mouth 2 (two) times daily.  [provider] Taking Active Self          Assessment:  Drugs sorted by system:  Neurologic/Psychologic:  alprazolam  Cardiovascular:metoprolol, Xarelto, simvastatin  Gastrointestinal: calcium carbonate, omeprazole, ondansetron, Miralax, Compazine  Endocrine: levothyroxine  Topical: flonase nasal, eye drops, EMLA  Pain: ibuprofen  Infectious Diseases: amoxil, dexamethasone  Vitamins/Minerals/Supplements: vitD, Boost Plus, MVI, fish oil, vit C  Medication Review Findings:  . Counseled patient on S/Sx of bleeding from Xarelto . Patient taking ibuprofen sparingly. . She is not taking her oxycodone as she does not like pain meds.  Plan: -I will route [facility] note to PCP  -I will f/u to schedule home visit with [facility] home visit  Regina Eck, PharmD, [place]  [phone]

## 2018-02-19 ENCOUNTER — Other Ambulatory Visit: Payer: Self-pay

## 2018-02-19 ENCOUNTER — Ambulatory Visit: Payer: PPO | Admitting: Cardiology

## 2018-02-19 ENCOUNTER — Encounter: Payer: Self-pay | Admitting: Cardiology

## 2018-02-19 DIAGNOSIS — Z7901 Long term (current) use of anticoagulants: Secondary | ICD-10-CM

## 2018-02-19 DIAGNOSIS — K56600 Partial intestinal obstruction, unspecified as to cause: Secondary | ICD-10-CM

## 2018-02-19 DIAGNOSIS — G4733 Obstructive sleep apnea (adult) (pediatric): Secondary | ICD-10-CM

## 2018-02-19 DIAGNOSIS — I10 Essential (primary) hypertension: Secondary | ICD-10-CM

## 2018-02-19 DIAGNOSIS — I480 Paroxysmal atrial fibrillation: Secondary | ICD-10-CM

## 2018-02-19 DIAGNOSIS — C569 Malignant neoplasm of unspecified ovary: Secondary | ICD-10-CM

## 2018-02-19 MED ORDER — SIMVASTATIN 20 MG PO TABS: 20.0000 mg | ORAL_TABLET | Freq: Every day | ORAL | 3 refills | Status: DC

## 2018-02-19 MED ORDER — METOPROLOL SUCCINATE ER 50 MG PO TB24: 50.0000 mg | ORAL_TABLET | Freq: Every day | ORAL | 3 refills | Status: DC

## 2018-02-19 MED ORDER — RIVAROXABAN 20 MG PO TABS: 20.0000 mg | ORAL_TABLET | Freq: Every day | ORAL | 0 refills | Status: DC

## 2018-02-19 MED ORDER — RIVAROXABAN 20 MG PO TABS: 20.0000 mg | ORAL_TABLET | Freq: Every day | ORAL | 3 refills | Status: DC

## 2018-02-19 NOTE — Telephone Encounter (Signed)
Spoke with Delrae Rend and she stated that she spoke with [facility] and was told to d/c the Rx that was sent to them. Rx was sent to the local [facility] in [place]. Nothing further needed at this time.

## 2018-02-19 NOTE — Assessment & Plan Note (Addendum)
Recurrent PAF when hospitalized Jan 2020

## 2018-02-19 NOTE — Progress Notes (Signed)
02/19/2018 [place]   1945-12-30  [id number]  Primary Physician Martinique, [name], MD Primary Cardiologist: Dr Claiborne Billings  HPI: Patient is a pleasant 73 year old female followed by Dr. Claiborne Billings with a history of PAF, sleep apnea, and dyslipidemia.  She had a remote low risk Myoview in 2013.  She has sleep apnea and is not on CPAP, Dr. Claiborne Billings has been trying to get her to an ENT for evaluation.  He saw her in September 2019.  In December 2019 she was diagnosed with ovarian cancer with metastasis.  She started on chemotherapy with plans for eventual surgery.  In January 2020 she was admitted with a partial small bowel obstruction.  She was in the hospital for 3 weeks.  During that hospitalization she had recurrent PAF with RVR.  Cardiology was not consulted.  Diltiazem was added and her Toprol was increased.  She is in the office today for follow-up.  From a cardiac standpoint she is done pretty well, she is back in sinus rhythm.  She has concerns about affording Xarelto but is reluctant to consider warfarin therapy.  During her hospitalization 1 of her complaints was right leg pain.  This was attributed to fibromyalgia.  She tells me that this still bothers her but it is better since she started wearing a compression stocking on that leg.  She did have a venous Doppler while she was hospitalized which was negative for a DVT.   Current Outpatient Medications  Medication Sig Dispense Refill  . ALPRAZolam (XANAX) 0.5 MG tablet TAKE ONE TABLET BY MOUTH ONCE DAILY AS NEEDED (Patient taking differently: Take 0.25-0.5 mg by mouth daily as needed for anxiety. TAKE ONE TABLET BY MOUTH ONCE DAILY AS NEEDED) 90 tablet 3  . Calcium Carbonate (CALTRATE 600) 1500 MG TABS Take by mouth daily.     . cholecalciferol (VITAMIN D) 1000 UNITS tablet Take 1,000 Units by mouth daily.     Marland Kitchen dexamethasone (DECADRON) 4 MG tablet Take 2 tabs at the night before and 2 tabs the morning of chemotherapy, every 3 weeks, by mouth 20  tablet 0  . diltiazem (CARDIZEM) 30 MG tablet Take 1 tablet (30 mg total) by mouth every 6 (six) hours. 360 tablet 0  . famotidine (PEPCID) 40 MG tablet Take 1 tablet (40 mg total) by mouth daily. 90 tablet 0  . fluticasone (CUTIVATE) 0.05 % cream Apply 1 application topically at bedtime.     . fluticasone (FLONASE) 50 MCG/ACT nasal spray Use 2 sprays in each nostril daily 48 g 2  . ibuprofen (ADVIL,MOTRIN) 200 MG tablet Take 600 mg by mouth daily as needed for moderate pain.    Marland Kitchen lactose free nutrition (BOOST PLUS) LIQD Take 237 mLs by mouth daily. 10 Can 0  . levothyroxine (SYNTHROID, LEVOTHROID) 75 MCG tablet Take 1 tablet (75 mcg total) by mouth daily. 90 tablet 3  . lidocaine-prilocaine (EMLA) cream Apply 1 application topically as needed. 30 g 6  . Menthol-Methyl Salicylate (MUSCLE RUB) 10-15 % CREA Apply 1 application topically as needed for muscle pain.    . metoprolol succinate (TOPROL-XL) 50 MG 24 hr tablet Take 1 tablet (50 mg total) by mouth daily. Take with or immediately following a meal. 90 tablet 0  . Multiple Vitamins-Minerals (MULTIVITAMIN WITH MINERALS) tablet Take 1 tablet by mouth daily.      . Omega-3 Fatty Acids (FISH OIL) 1000 MG CAPS Take 2 capsules by mouth 2 (two) times daily.    . ondansetron (ZOFRAN)  8 MG tablet Take 1 tablet (8 mg total) by mouth every 8 (eight) hours as needed for nausea. 30 tablet 3  . oxyCODONE (OXY IR/ROXICODONE) 5 MG immediate release tablet Take 1 tablet (5 mg total) by mouth every 4 (four) hours as needed for severe pain. 30 tablet 0  . polyethylene glycol (MIRALAX / GLYCOLAX) packet Take 17 g by mouth daily. (Patient taking differently: Take 17 g by mouth 2 (two) times daily. ) 14 each 0  . prochlorperazine (COMPAZINE) 10 MG tablet Take 1 tablet (10 mg total) by mouth every 6 (six) hours as needed for nausea or vomiting. 30 tablet 0  . rivaroxaban (XARELTO) 20 MG TABS tablet Take 1 tablet (20 mg total) by mouth daily with supper. 30 tablet 0  .  simvastatin (ZOCOR) 20 MG tablet Take 1 tablet (20 mg total) by mouth at bedtime. 90 tablet 0  . vitamin C (ASCORBIC ACID) 500 MG tablet Take 500 mg by mouth 2 (two) times daily.      No current facility-administered medications for this visit.     Allergies  Allergen Reactions  . Tramadol Hcl     REACTION: jittery  . Tramadol Nausea And Vomiting    Past Medical History:  Diagnosis Date  . Allergy    SEASONAL  . Cataract    BILATERAL  . Fibromyalgia   . GERD (gastroesophageal reflux disease)   . H/O echocardiogram 04/28/08   EF>55% trace mitral regurgitation, No significant valvular pathology  . Hemorrhoids    internal  . History of stress test 02/08/2011   Normal Myocardial perfusion study, this is a low risk scan, No prior study available for comparison  . Hyperlipidemia   . MVP (mitral valve prolapse)    mild and MR  . OA (osteoarthritis)   . Osteoporosis   . Paroxysmal A-fib ([place])   . Sleep apnea    does not use prescribed CPAP  . Thyroid disease    HYPO  . Varicosities of leg   . Vitamin D deficiency     Social History   Socioeconomic History  . Marital status: Married    Spouse name: Not on file  . Number of children: Not on file  . Years of education: Not on file  . Highest education level: Not on file  Occupational History  . Not on file  Social Needs  . Financial resource strain: Not on file  . Food insecurity:    Worry: Not on file    Inability: Not on file  . Transportation needs:    Medical: Not on file    Non-medical: Not on file  Tobacco Use  . Smoking status: Current Every Day Smoker    Packs/day: 0.50    Years: 10.00    Pack years: 5.00    Types: Cigars  . Smokeless tobacco: Never Used  . Tobacco comment: peach flavor   Substance and Sexual Activity  . Alcohol use: No    Alcohol/week: 0.0 standard drinks  . Drug use: No  . Sexual activity: Yes    Comment: 1st intercourse 17yo-1 partner  Lifestyle  . Physical activity:    Days  per week: Not on file    Minutes per session: Not on file  . Stress: Not on file  Relationships  . Social connections:    Talks on phone: Not on file    Gets together: Not on file    Attends religious service: Not on file    Active member  of club or organization: Not on file    Attends meetings of clubs or organizations: Not on file    Relationship status: Not on file  . Intimate partner violence:    Fear of current or ex partner: Not on file    Emotionally abused: Not on file    Physically abused: Not on file    Forced sexual activity: Not on file  Other Topics Concern  . Not on file  Social History Narrative  . Not on file     Family History  Problem Relation Age of Onset  . Diabetes Daughter   . Diabetes Mother   . Hypertension Mother   . Heart disease Mother   . Stroke Maternal Grandmother   . Cancer Niece        unsure breast or lung  . Colon cancer Neg Hx      Review of Systems: General: negative for chills, fever, night sweats or weight changes.  Cardiovascular: negative for chest pain, dyspnea on exertion, edema, orthopnea, palpitations, paroxysmal nocturnal dyspnea or shortness of breath Dermatological: negative for rash Respiratory: negative for cough or wheezing Urologic: negative for hematuria Abdominal: negative for nausea, vomiting, diarrhea, bright red blood per rectum, melena, or hematemesis Neurologic: negative for visual changes, syncope, or dizziness All other systems reviewed and are otherwise negative except as noted above.    Blood pressure 120/72, pulse (!) 58, height 5\' 2"  (1.575 m), weight 132 lb 9.6 oz (60.1 kg), SpO2 98 %.  General appearance: alert, cooperative and no distress Neck: no carotid bruit and no JVD Lungs: clear to auscultation bilaterally Heart: regular rate and rhythm Extremities: no edema Pulses: Rt DP 2+ Skin: pale, warm, dry Neurologic: Grossly normal  EKG [id number]  ASSESSMENT AND PLAN:   PAF (paroxysmal atrial  fibrillation) (HCC) Recurrent PAF when hospitalized Jan 2020- now back in NSR- SB- will drop Diltiazem 30 mg Q6, connie Toprol 50 mg  Chronic anticoagulation CHADS VASC=3-on Xarelto  Ovarian cancer ([place]) suspected Diagnosed Dec 2019- ovarian CA with mets- Rx'd with chemotherapy  Partial small bowel obstruction ([place]) Admitted 01/13/2018- 02/02/2018  OSA (obstructive sleep apnea) Followed by Dr Claiborne Billings- not on C-pap  Essential hypertension, benign Controlled   PLAN  DC Diltiazem, continue Toprol XL 50 mg. I'll have her come back in two weeks for an EKG. I referred her to her PCP about her Rt leg pain.  A home pharmacist is supposedly working on keeping the patient on Xarelto, we'll provide samples today if available.    She will need OB GYN surgery in March but also needs some teeth pulled.  She is very reluctant, apparently her daughter (a diabetic) died from sepsis after a tooth extraction.    F/U Dr Claiborne Billings in 3 months.   Haley Ransom PA-C 02/19/2018 11:41 AM

## 2018-02-19 NOTE — Assessment & Plan Note (Signed)
Controlled.  

## 2018-02-19 NOTE — Assessment & Plan Note (Signed)
CHADS VASC=3

## 2018-02-19 NOTE — Patient Instructions (Addendum)
Medication Instructions:  STOP TAKING YOUR DILTIAZEM (CARDIZEM).  CONTINUE TAKING YOUR DAILY METOPROLOL (TOPROL-XL) 50 MG.  YOU HAVE BEEN PROVIDED WITH A 14-TABLET SAMPLE OF RIVAROXABAN (XARELTO).  If you need a refill on your cardiac medications before your next appointment, please call your pharmacy.   Lab work: NONE If you have labs (blood work) drawn today and your tests are completely normal, you will receive your results only by: Marland Kitchen MyChart Message (if you have MyChart) OR . A paper copy in the mail If you have any lab test that is abnormal or we need to change your treatment, we will call you to review the results.  Testing/Procedures: NONE  Follow-Up: At [facility], you and your health needs are our priority.  As part of our continuing mission to provide you with exceptional heart care, we have created designated Provider Care Teams.  These Care Teams include your primary Cardiologist (physician) and Advanced Practice Providers (APPs -  Physician Assistants and Nurse Practitioners) who all work together to provide you with the care you need, when you need it. You have a follow up appointment on Tuesday, 04/03/2018 at 10:20 am with Dr. Claiborne Billings.      Any Other Special Instructions Will Be Listed Below (If Applicable).  PLEASE RETURN TO OUR OFFICE ON Thursday 03/08/2018 AT 4:00 pm FOR A REPEAT EKG.

## 2018-02-19 NOTE — Assessment & Plan Note (Signed)
Admitted 01/13/2018- 02/02/2018

## 2018-02-19 NOTE — Assessment & Plan Note (Signed)
Followed by Dr Claiborne Billings- not on C-pap

## 2018-02-19 NOTE — Assessment & Plan Note (Signed)
Diagnosed Dec 2019- ovarian CA with mets- Rx'd with chemotherapy

## 2018-02-20 ENCOUNTER — Other Ambulatory Visit: Payer: Self-pay | Admitting: Hematology and Oncology

## 2018-02-20 ENCOUNTER — Telehealth: Payer: Self-pay | Admitting: *Deleted

## 2018-02-20 DIAGNOSIS — C569 Malignant neoplasm of unspecified ovary: Secondary | ICD-10-CM

## 2018-02-20 NOTE — Telephone Encounter (Signed)
Message left with provider instructions to find a local dentist and further discussion at next F/U visit.

## 2018-02-20 NOTE — Telephone Encounter (Signed)
1) I am not sure what she meant by the blue bag. She can bring in her next visit 2) I will discuss with her about the teeth in her next visit. Most dental care is not covered by medicare. She needs to find a local dentist for checkup.

## 2018-02-20 NOTE — Telephone Encounter (Signed)
Called and given below message. She verbalized understanding. 

## 2018-02-20 NOTE — Telephone Encounter (Signed)
Haley [name]'s husband [phone]) request advice.  "She thinks she is to bring something back to the [facility] and not throw in the trash."  Haley [name] reports she received a blue bag with chemotherapy information.  Reads she is not to dispose of body fluids in regular trash, take nausea medications daily and more.        "Need to make Dr. Alvy Bimler aware two teeth should be removed before surgery at [facility] per cardiologist.  Don't have a dentist.  In 2018, Dr. Harrington Challenger DDS in [place] refered me to have teeth removed.  Didn't see the provider out of network.  Now one is loose, very sore pale white gums, no bleeding or fever. Going to bed and waking up with abdominal cramps, pain.  Constant.  Take Ibuprofen.  Only need oxycodone maybe twice a week. My bowels are loose.  Using Mira lax daily."  Advised to ask for red Bio- harzard bags on next visit.

## 2018-02-21 ENCOUNTER — Telehealth: Payer: Self-pay | Admitting: Oncology

## 2018-02-21 ENCOUNTER — Other Ambulatory Visit: Payer: Self-pay | Admitting: Pharmacist

## 2018-02-21 ENCOUNTER — Other Ambulatory Visit: Payer: Self-pay

## 2018-02-21 ENCOUNTER — Ambulatory Visit: Payer: Self-pay | Admitting: Pharmacist

## 2018-02-21 NOTE — Telephone Encounter (Signed)
Called Haley [name] and notified her of appointments for CT scan on 03/15/18 at 9:00 am (arrival at 8:45, pick up contrast from the cancer center on 2/17 apt and drink contrast at 7 am and 8 am, NPO 4 hours prior) and appointment with Dr. Denman George on 03/16/18 at 12 pm to discuss surgery.  She verbalized agreement and then said she has a loose tooth that needs to be pulled before surgery.  She is concerned that if it falls out on its own, she will bleed a lot due to taking Xarelto.  She doesn't think she can wait until 03/02/18 to have it looked at.

## 2018-02-21 NOTE — Patient Outreach (Signed)
[facility]) Care Management   02/21/2018  Artesian 08-Aug-1945 [id number]  Haley [name] is an 73 y.o. female  Subjective:  Joint home visit with [place] and [place]. Member alert, oriented x 3 and in no acute distress.  Objective:   BP 132/68 (BP Location: Right Arm, Patient Position: Sitting, Cuff Size: Normal)   Pulse 64   Resp 18   SpO2 97%   Review of Systems  Constitutional: Positive for malaise/fatigue.  HENT: Negative.   Eyes: Negative.   Respiratory: Positive for cough. Negative for sputum production.   Cardiovascular: Negative.   Gastrointestinal: Negative for abdominal pain, constipation, diarrhea, nausea and vomiting.  Genitourinary: Positive for frequency.  Musculoskeletal: Negative.   Skin: Negative.   Neurological: Negative for dizziness and headaches.    Physical Exam  Constitutional: She is oriented to person, place, and time. She appears well-developed.  Cardiovascular: Normal rate.  Respiratory: Effort normal.  GI: Soft.  Neurological: She is alert and oriented to person, place, and time.  Skin: Skin is warm and dry.  Psychiatric: She has a normal mood and affect. Her behavior is normal. Judgment and thought content normal.    Encounter Medications:   Outpatient Encounter Medications as of 02/21/2018  Medication Sig  . ALPRAZolam (XANAX) 0.5 MG tablet TAKE ONE TABLET BY MOUTH ONCE DAILY AS NEEDED (Patient taking differently: Take 0.25-0.5 mg by mouth daily as needed for anxiety. TAKE ONE TABLET BY MOUTH ONCE DAILY AS NEEDED)  . Calcium Carbonate (CALTRATE 600) 1500 MG TABS Take by mouth daily.   . cholecalciferol (VITAMIN D) 1000 UNITS tablet Take 1,000 Units by mouth daily.   Marland Kitchen dexamethasone (DECADRON) 4 MG tablet Take 2 tabs at the night before and 2 tabs the morning of chemotherapy, every 3 weeks, by mouth  . famotidine (PEPCID) 40 MG tablet Take 1 tablet (40 mg total) by mouth daily.  . fluticasone (CUTIVATE) 0.05 %  cream Apply 1 application topically at bedtime.   . fluticasone (FLONASE) 50 MCG/ACT nasal spray Use 2 sprays in each nostril daily  . ibuprofen (ADVIL,MOTRIN) 200 MG tablet Take 600 mg by mouth daily as needed for moderate pain.  Marland Kitchen lactose free nutrition (BOOST PLUS) LIQD Take 237 mLs by mouth daily.  Marland Kitchen levothyroxine (SYNTHROID, LEVOTHROID) 75 MCG tablet Take 1 tablet (75 mcg total) by mouth daily.  Marland Kitchen lidocaine-prilocaine (EMLA) cream Apply 1 application topically as needed.  . Menthol-Methyl Salicylate (MUSCLE RUB) 10-15 % CREA Apply 1 application topically as needed for muscle pain.  . metoprolol succinate (TOPROL-XL) 50 MG 24 hr tablet Take 1 tablet (50 mg total) by mouth daily. Take with or immediately following a meal.  . Multiple Vitamins-Minerals (MULTIVITAMIN WITH MINERALS) tablet Take 1 tablet by mouth daily.    . Omega-3 Fatty Acids (FISH OIL) 1000 MG CAPS Take 2 capsules by mouth 2 (two) times daily.  . ondansetron (ZOFRAN) 8 MG tablet Take 1 tablet (8 mg total) by mouth every 8 (eight) hours as needed for nausea.  Marland Kitchen oxyCODONE (OXY IR/ROXICODONE) 5 MG immediate release tablet Take 1 tablet (5 mg total) by mouth every 4 (four) hours as needed for severe pain.  . polyethylene glycol (MIRALAX / GLYCOLAX) packet Take 17 g by mouth daily. (Patient taking differently: Take 17 g by mouth 2 (two) times daily. )  . prochlorperazine (COMPAZINE) 10 MG tablet Take 1 tablet (10 mg total) by mouth every 6 (six) hours as needed for nausea or vomiting.  Marland Kitchen  rivaroxaban (XARELTO) 20 MG TABS tablet Take 1 tablet (20 mg total) by mouth daily with supper.  . simvastatin (ZOCOR) 20 MG tablet Take 1 tablet (20 mg total) by mouth at bedtime.  . vitamin C (ASCORBIC ACID) 500 MG tablet Take 500 mg by mouth 2 (two) times daily.    No facility-administered encounter medications on file as of 02/21/2018.     Functional Status:   In your present state of health, do you have any difficulty performing the following  activities: 01/16/2018  Hearing? N  Vision? N  Difficulty concentrating or making decisions? N  Walking or climbing stairs? N  Dressing or bathing? N  Doing errands, shopping? N  Some recent data might be hidden    Fall/Depression Screening:    Fall Risk  12/02/2015 10/24/2014 08/13/2013  Falls in the past year? Yes No No  Number falls in past yr: 2 or more - -  Injury with Fall? No - -   PHQ 2/9 Scores 09/13/2017 12/02/2015 10/24/2014 08/13/2013 08/06/2012  PHQ - 2 Score 0 0 1 0 0    Assessment:    Joint Home visit complete. Mrs. Kubisiak reported experiencing nausea along with severe back, leg and bone pain after discharge. Reported symptoms resolved about a week ago. Denied complaints of pain, nausea, shortness of breath or chest discomfort during visit. No falls reported. Reported compliance with prescribed medication and attending appointments as scheduled. Tolerating small meals and reported compliance with diet recommendations. Confirmed start of home health therapy and nursing services. Discussed [facility] services and care management needs. Mrs. Morand reported that her spouse and family members were currently available to assist if needed. Denied immediate need for transportation or assistance in the home. Stated she is currently following MDs recommendation to avoid crowds 30 days post discharge but agreeable to further discussion regarding community support groups. Mrs. Haegele was very [employer] and agreed to contact care team with questions and concerns as needed. PLAN Will continue routine follow-up.  [place] [phone]

## 2018-02-21 NOTE — Patient Outreach (Signed)
[facility]) Care Management  [place]   02/21/2018  Haley [name] [birth year] [id number]  Reason for referral: Medication Assistance with Xarelto  Referral source: [facility] RN Current insurance:[employer]  PMHx includes but not limited to:  Ovarian cancer, Afib, GERD, HTN, hypothyroidism  Outreach:  Successful joint [facility] RN CM home visit with Haley [name].  HIPAA identifiers verified.  Patient greeted [place] at the door and was in a happy disposition.  She states she is doing okay overall.Patient organizes her medications in a pillbox and is very capable of this.  She denies needing compliance packaging.  She uses [facility] mail order) for maintenance medications and [facility] in [place], [place] for urgent fills.  Patient states that she received a free-month Xarelto for the 1st fill.  Her cardiologist gave her a 2-week supply of Xarelto at her last visit on 02/19/2018.  Patient states that [employer] quoted her $93/month for Xarelto.  [facility] Pharmacist spoke with CPhT at [facility] and she stated that patient can get 71-monthsupply of Xarelto for $90.  Patient does not wish to pursue warfarin due to multiple reasons (labs, visits/copays, diet, etc).  Patient is agreeable to get Xarelto 351-monthupply refilled now at this time.  [facility] informed patient of  patient assistance program offered through [employer] for Xarelto.  Patient will have to 3-4% OOP.  I will follow up with patient next month to assess OOP and pharmacy needs.  Medication & allergy list updated in [facility].  Objective: Lab Results  Component Value Date   CREATININE 0.75 02/08/2018   CREATININE 0.65 02/02/2018   CREATININE 0.59 02/01/2018    Lab Results  Component Value Date   HGBA1C 6.0 08/06/2012    Lipid Panel     Component Value Date/Time   CHOL 157 11/03/2017 1019   TRIG 154.0 (H) 11/03/2017 1019   HDL 56.00 11/03/2017 1019   CHOLHDL 3 11/03/2017 1019   VLDL 30.8 11/03/2017 1019   LDLCALC 70 11/03/2017 1019   LDLDIRECT 84.3 12/16/2010 0921    BP Readings from Last 3 Encounters:  02/19/18 120/72  02/14/18 140/66  02/09/18 (!) 114/56    Allergies  Allergen Reactions  . Tramadol Hcl     REACTION: jittery  . Tramadol Nausea And Vomiting    Medications Reviewed Today    Reviewed by [facility]) on 02/21/18 at 1451  Med List Status: <None>  Medication Order Taking? Sig Documenting Provider Last Dose Status Informant  ALPRAZolam (XANAX) 0.5 MG tablet [id number] TAKE ONE TABLET BY MOUTH ONCE DAILY AS NEEDED  Patient taking differently:  Take 0.25-0.5 mg by mouth daily as needed for anxiety. TAKE ONE TABLET BY MOUTH ONCE DAILY AS NEEDED   KwMarletta LorMD Taking Active Self  Calcium Carbonate (CALTRATE 600) 1500 MG TABS [id number] Take by mouth daily.  [provider] Taking Active Self  cholecalciferol (VITAMIN D) 1000 UNITS tablet [id number] Take 1,000 Units by mouth daily.  [provider] Taking Active Self  dexamethasone (DECADRON) 4 MG tablet [id number] Take 2 tabs at the night before and 2 tabs the morning of chemotherapy, every 3 weeks, by mouth GoAlvy BimlerNi, MD Taking Active   famotidine (PEPCID) 40 MG tablet [id number] Take 1 tablet (40 mg total) by mouth daily. JoMartiniqueBetty G, MD Taking Active   fluticasone (CUTIVATE) 0.05 % cream [id number] Apply 1 application topically at bedtime.  [provider]  Taking Active Self  fluticasone (FLONASE) 50 MCG/ACT nasal spray [id number] Yes Use 2 sprays in each nostril daily Marletta Lor, MD Taking Active Self  ibuprofen (ADVIL,MOTRIN) 200 MG tablet [id number] Yes Take 600 mg by mouth daily as needed for moderate pain. [provider] Taking Active Self  lactose free nutrition (BOOST PLUS) LIQD [id number] Yes Take 237 mLs by mouth daily. Kayleen Memos, DO Taking Active   levothyroxine (SYNTHROID, LEVOTHROID) 75 MCG tablet [id number] Yes  Take 1 tablet (75 mcg total) by mouth daily. [place], Morgin G, MD Taking Active Self  lidocaine-prilocaine (EMLA) cream [id number] Yes Apply 1 application topically as needed. Heath Lark, MD Taking Active   Menthol-Methyl Salicylate (MUSCLE RUB) 10-15 % CREA [id number] Yes Apply 1 application topically as needed for muscle pain. [provider] Taking Active Self  metoprolol succinate (TOPROL-XL) 50 MG 24 hr tablet [id number] Yes Take 1 tablet (50 mg total) by mouth daily. Take with or immediately following a meal. Almyra Deforest, [place] Taking Active   Multiple Vitamins-Minerals (MULTIVITAMIN WITH MINERALS) tablet [id number] Yes Take 1 tablet by mouth daily.   [provider] Taking Active Self  Omega-3 Fatty Acids (FISH OIL) 1000 MG CAPS [id number] Yes Take 2 capsules by mouth 2 (two) times daily. [provider] Taking Active Self  ondansetron (ZOFRAN) 8 MG tablet [id number] Yes Take 1 tablet (8 mg total) by mouth every 8 (eight) hours as needed for nausea. Heath Lark, MD Taking Active   oxyCODONE (OXY IR/ROXICODONE) 5 MG immediate release tablet [id number] Yes Take 1 tablet (5 mg total) by mouth every 4 (four) hours as needed for severe pain. Heath Lark, MD Taking Active   polyethylene glycol [facility] / GLYCOLAX) packet [id number] Yes Take 17 g by mouth daily.  Patient taking differently:  Take 17 g by mouth 2 (two) times daily.    Kayleen Memos, DO Taking Active   prochlorperazine (COMPAZINE) 10 MG tablet [id number] Yes Take 1 tablet (10 mg total) by mouth every 6 (six) hours as needed for nausea or vomiting. Heath Lark, MD Taking Active   rivaroxaban (XARELTO) 20 MG TABS tablet [id number] Yes Take 1 tablet (20 mg total) by mouth daily with supper. Erlene Quan, PA-C Taking Active   simvastatin (ZOCOR) 20 MG tablet [id number] Yes Take 1 tablet (20 mg total) by mouth at bedtime. Erlene Quan, PA-C Taking Active   vitamin C (ASCORBIC ACID) 500 MG tablet [id number] Yes Take 500 mg by mouth  2 (two) times daily.  [provider] Taking Active Self          Assessment:  Drugs sorted by system:  Neurologic/Psychologic:  alprazolam  Cardiovascular: Xarelto, simvastatin  Pulmonary/Allergy: Flonase  Gastrointestinal: omeprazole, ondansetron, Miralax, Compazine  Endocrine: levothyroxine  Topical: EMLA, muscle run  Pain: oxycodone (not taking but has RX if needed), IBU PRN  Infectious Diseases: amoxicillin (tooth infection), dexamethasone (per chemo schedule)  Vitamins/Minerals/Supplements: vit C, Boost, caltrate  Medication Assistance Findings:   Extra Help:   '[]'$  Already receiving Full Extra Help  '[]'$  Already receiving Partial Extra Help  '[]'$  Eligible based on reported income and assets  '[x]'$  Not Eligible based on reported income and assets  Patient Assistance Programs: 1) Xarelto made by [employer] o Income requirement met: '[x]'$  Yes '[]'$  No '[]'$  Unknown o Out-of-pocket prescription expenditure met:    '[]'$  Yes '[]'$  No  '[]'$  Unknown  '[x]'$  Not applicable Patient has not met application requirements to apply for this patient assistance program at  this time.   Will reassess next month.  Plan: - I will follow up with patient next month to assess OOP and pharmacy needs.     Regina Eck, PharmD, [place]  [phone]

## 2018-02-23 ENCOUNTER — Telehealth: Payer: Self-pay | Admitting: *Deleted

## 2018-02-23 NOTE — Telephone Encounter (Signed)
[name] [phone]).  This tooth is loose.  I need lose tooth  it removed before it falls out and before cancer surgery.  Don't mind driving to [facility] to see a dentist because none in [place] are in Network.  That's going to take forever; I don't want to waste time waiting for an appointment to see a dentist.  General dentist can't remove teeth.    Is she going to remove my teeth there?  Isn't this an emergency?    What do I need to tell the cardiologist?  [place] covers dental; I'll ask what they'll do or pay for the dentist out of network.    Does the dentist need to know oncologist or medical doctors name?  I believe my surgeon will be s doctor there"  Informed with this call, teeth will not be removed nest week by oncologist.  Dental surgeons receive referrals by your dentist.  Discussion questions will occur with next F/U.  Important to see dentist soon.  A dentist may be able to see you same day of request.  Contact [employer] upon ending this call.  Ask coverage information and names of Network providers covered.  Call providers ASAP to choose dentist you're comfortable with offerring earliest appointment.  Dentist needs to evaluate status of reported dental problem, perform dental x-rays and identify any new problems to make referral.  Not a waste of time but necessary to make referral and insurance to make decision to authorization or decline need for oral surgeon procedures.

## 2018-02-26 ENCOUNTER — Inpatient Hospital Stay (HOSPITAL_BASED_OUTPATIENT_CLINIC_OR_DEPARTMENT_OTHER): Payer: PPO | Admitting: Genetics

## 2018-02-26 ENCOUNTER — Emergency Department (HOSPITAL_COMMUNITY)
Admission: EM | Admit: 2018-02-26 | Discharge: 2018-02-27 | Disposition: A | Discharge status: Home / Self Care | Payer: PPO | Attending: Emergency Medicine | Admitting: Emergency Medicine

## 2018-02-26 ENCOUNTER — Encounter (HOSPITAL_COMMUNITY): Payer: Self-pay

## 2018-02-26 ENCOUNTER — Encounter: Payer: Self-pay | Admitting: Hematology and Oncology

## 2018-02-26 ENCOUNTER — Inpatient Hospital Stay: Payer: PPO

## 2018-02-26 ENCOUNTER — Encounter: Payer: Self-pay | Admitting: Oncology

## 2018-02-26 ENCOUNTER — Inpatient Hospital Stay: Payer: PPO | Admitting: Licensed Clinical Social Worker

## 2018-02-26 DIAGNOSIS — E039 Hypothyroidism, unspecified: Secondary | ICD-10-CM | POA: Insufficient documentation

## 2018-02-26 DIAGNOSIS — Y92512 Supermarket, store or market as the place of occurrence of the external cause: Secondary | ICD-10-CM | POA: Diagnosis not present

## 2018-02-26 DIAGNOSIS — S032XXA Dislocation of tooth, initial encounter: Secondary | ICD-10-CM | POA: Insufficient documentation

## 2018-02-26 DIAGNOSIS — Z801 Family history of malignant neoplasm of trachea, bronchus and lung: Secondary | ICD-10-CM

## 2018-02-26 DIAGNOSIS — Z8049 Family history of malignant neoplasm of other genital organs: Secondary | ICD-10-CM

## 2018-02-26 DIAGNOSIS — Y998 Other external cause status: Secondary | ICD-10-CM | POA: Diagnosis not present

## 2018-02-26 DIAGNOSIS — X58XXXA Exposure to other specified factors, initial encounter: Secondary | ICD-10-CM | POA: Diagnosis not present

## 2018-02-26 DIAGNOSIS — Z79899 Other long term (current) drug therapy: Secondary | ICD-10-CM | POA: Insufficient documentation

## 2018-02-26 DIAGNOSIS — F17290 Nicotine dependence, other tobacco product, uncomplicated: Secondary | ICD-10-CM | POA: Insufficient documentation

## 2018-02-26 DIAGNOSIS — Z8042 Family history of malignant neoplasm of prostate: Secondary | ICD-10-CM | POA: Diagnosis not present

## 2018-02-26 DIAGNOSIS — S0990XA Unspecified injury of head, initial encounter: Secondary | ICD-10-CM | POA: Diagnosis present

## 2018-02-26 DIAGNOSIS — C569 Malignant neoplasm of unspecified ovary: Secondary | ICD-10-CM

## 2018-02-26 DIAGNOSIS — Y9389 Activity, other specified: Secondary | ICD-10-CM | POA: Diagnosis not present

## 2018-02-26 MED ORDER — AMOXICILLIN 500 MG PO CAPS: 500.0000 mg | ORAL_CAPSULE | Freq: Three times a day (TID) | ORAL | 0 refills | Status: AC

## 2018-02-26 MED ORDER — AMOXICILLIN 500 MG PO CAPS
500.0000 mg | ORAL_CAPSULE | Freq: Once | ORAL | Status: AC
  Administered 2018-02-27: 500 mg via ORAL
  Filled 2018-02-26: qty 1

## 2018-02-26 NOTE — ED Provider Notes (Signed)
[facility] DEPT Provider Note   CSN: [id number] Arrival date & time: 02/26/18  2102    History   Chief Complaint Chief Complaint  Patient presents with  . Dental Injury    HPI Haley [name] is a 73 y.o. female with a history of ovarian cancer, GERD, anxiety, and paroxysmal atrial fibrillation who presents to the emergency department with a chief complaint of dental problem.  The patient reports a loose tooth on her left lower mouth that fell out of her mouth while she was at the grocery store earlier tonight.  She is concerned that she may develop an infection from the open wound in her mouth.  She reports minimal associated pain.  No treatment prior to arrival.  She states that she is unsure if the entire tooth came out.  She denies of fever, chills, redness, swelling.  She has been able to talk and swallow without difficulty.  She reports that she developed a little bit of tingling in her bilateral hands tightness in her chest after the tooth came out that was consistent with anxiety.  The symptoms have since resolved.  She is having no chest pain, shortness of breath, palpitations, diaphoresis, or pallor.      The history is provided by the patient. No language interpreter was used.    Past Medical History:  Diagnosis Date  . Allergy    SEASONAL  . Cataract    BILATERAL  . Fibromyalgia   . GERD (gastroesophageal reflux disease)   . H/O echocardiogram 04/28/08   EF>55% trace mitral regurgitation, No significant valvular pathology  . Hemorrhoids    internal  . History of stress test 02/08/2011   Normal Myocardial perfusion study, this is a low risk scan, No prior study available for comparison  . Hyperlipidemia   . MVP (mitral valve prolapse)    mild and MR  . OA (osteoarthritis)   . Osteoporosis   . Paroxysmal A-fib ([place])   . Sleep apnea    does not use prescribed CPAP  . Thyroid disease    HYPO  . Varicosities of leg   . Vitamin D  deficiency     Patient Active Problem List   Diagnosis Date Noted  . Chronic anticoagulation 02/19/2018  . Anemia due to antineoplastic chemotherapy 02/09/2018  . Lower extremity pain 02/05/2018  . Encounter for imaging study to confirm nasogastric (NG) tube placement   . Ovarian cancer ([place]) suspected 01/16/2018  . Partial small bowel obstruction ([place]) 01/16/2018  . Abdominal pain 01/15/2018  . Carcinomatosis ([place]) 01/11/2018  . Essential hypertension, benign 09/13/2017  . Bilateral impacted cerumen 08/16/2017  . OSA (obstructive sleep apnea) 08/16/2017  . Seasonal allergic rhinitis 08/16/2017  . Osteoporosis 01/13/2014  . Urgency incontinence 01/13/2014  . PAF (paroxysmal atrial fibrillation) ([place]) 03/31/2013  . Tobacco use disorder 08/06/2012  . Dyslipidemia 06/05/2012  . Anxiety 06/05/2012  . GERD 06/03/2008  . Hypothyroidism 07/07/2006    Past Surgical History:  Procedure Laterality Date  . ABDOMINAL HYSTERECTOMY  1989  . APPENDECTOMY  1989   with hysterectomy  . BREAST EXCISIONAL BIOPSY Left 1995   Benign  . COLONOSCOPY  11-20-2000  . IR IMAGING GUIDED PORT INSERTION  01/17/2018  . NOSE SURGERY  1990     OB History    Gravida  3   Para  3   Term      Preterm      AB      Living  2  SAB      TAB      Ectopic      Multiple      Live Births               Home Medications    Prior to Admission medications   Medication Sig Start Date End Date Taking? Authorizing Provider  ALPRAZolam (XANAX) 0.5 MG tablet TAKE ONE TABLET BY MOUTH ONCE DAILY AS NEEDED Patient taking differently: Take 0.25-0.5 mg by mouth daily as needed for anxiety. TAKE ONE TABLET BY MOUTH ONCE DAILY AS NEEDED 05/10/17  Yes Marletta Lor, MD  Calcium Carbonate (CALTRATE 600) 1500 MG TABS Take by mouth daily.    Yes [provider]  Carboxymethylcellulose Sodium (EYE DROPS OP) Apply 1 drop to eye daily as needed. Thera Tears   Yes [provider]    cholecalciferol (VITAMIN D) 1000 UNITS tablet Take 1,000 Units by mouth daily.    Yes [provider]  dexamethasone (DECADRON) 4 MG tablet Take 2 tabs at the night before and 2 tabs the morning of chemotherapy, every 3 weeks, by mouth 02/08/18  Yes Alvy Bimler, Ni, MD  fluticasone (FLONASE) 50 MCG/ACT nasal spray Use 2 sprays in each nostril daily 05/11/17  Yes Marletta Lor, MD  ibuprofen (ADVIL,MOTRIN) 200 MG tablet Take 600 mg by mouth every 6 (six) hours as needed for moderate pain.    Yes [provider]  lactose free nutrition (BOOST PLUS) LIQD Take 237 mLs by mouth daily. 02/02/18  Yes Kayleen Memos, DO  levothyroxine (SYNTHROID, LEVOTHROID) 75 MCG tablet Take 1 tablet (75 mcg total) by mouth daily. 11/05/17  Yes Martin[place], Swayzie G, MD  lidocaine-prilocaine (EMLA) cream Apply 1 application topically as needed. 02/08/18  Yes Gorsuch, Ernst Spell, MD  Menthol-Methyl Salicylate (MUSCLE RUB) 10-15 % CREA Apply 1 application topically as needed for muscle pain.   Yes [provider]  metoprolol succinate (TOPROL-XL) 50 MG 24 hr tablet Take 1 tablet (50 mg total) by mouth daily. Take with or immediately following a meal. 02/19/18  Yes Meng, [place], PA  Multiple Vitamins-Minerals (MULTIVITAMIN WITH MINERALS) tablet Take 1 tablet by mouth daily.     Yes [provider]  Omega-3 Fatty Acids (FISH OIL) 1000 MG CAPS Take 2 capsules by mouth 2 (two) times daily.   Yes [provider]  omeprazole (PRILOSEC) 40 MG capsule Take 40 mg by mouth daily.   Yes [provider]  oxyCODONE (OXY IR/ROXICODONE) 5 MG immediate release tablet Take 1 tablet (5 mg total) by mouth every 4 (four) hours as needed for severe pain. 02/08/18  Yes Gorsuch, Ni, MD  polyethylene glycol (MIRALAX / GLYCOLAX) packet Take 17 g by mouth daily. Patient taking differently: Take 17 g by mouth 2 (two) times daily.  02/02/18  Yes Kayleen Memos, DO  rivaroxaban (XARELTO) 20 MG TABS tablet Take 1 tablet  (20 mg total) by mouth daily with supper. 02/19/18  Yes Kilroy, Luke K, PA-C  simvastatin (ZOCOR) 20 MG tablet Take 1 tablet (20 mg total) by mouth at bedtime. 02/19/18  Yes Kilroy, Luke K, PA-C  vitamin C (ASCORBIC ACID) 500 MG tablet Take 500 mg by mouth 2 (two) times daily.    Yes [provider]  amoxicillin (AMOXIL) 500 MG capsule Take 1 capsule (500 mg total) by mouth 3 (three) times daily for 5 days. 02/26/18 03/03/18  Aren Cherne A, PA-C  ondansetron (ZOFRAN) 8 MG tablet Take 1 tablet (8 mg total)  by mouth every 8 (eight) hours as needed for nausea. 02/08/18   Heath Lark, MD  prochlorperazine (COMPAZINE) 10 MG tablet Take 1 tablet (10 mg total) by mouth every 6 (six) hours as needed for nausea or vomiting. 02/08/18   Heath Lark, MD    Family History Family History  Problem Relation Age of Onset  . Diabetes Daughter   . Diabetes Mother   . Hypertension Mother   . Heart disease Mother   . Stroke Maternal Grandmother   . Cancer Niece        unsure breast or lung  . Colon cancer Neg Hx     Social History Social History   Tobacco Use  . Smoking status: Light Tobacco Smoker    Packs/day: 0.50    Years: 10.00    Pack years: 5.00    Types: Cigars  . Smokeless tobacco: Never Used  . Tobacco comment: peach flavor   Substance Use Topics  . Alcohol use: No    Alcohol/week: 0.0 standard drinks  . Drug use: No     Allergies   Tramadol hcl and Tramadol   Review of Systems Review of Systems  Constitutional: Negative for activity change, chills and fever.  HENT: Positive for dental problem. Negative for ear pain, sinus pain and sore throat.   Respiratory: Negative for shortness of breath.   Cardiovascular: Negative for chest pain.  Gastrointestinal: Negative for abdominal pain.  Musculoskeletal: Negative for back pain.  Skin: Negative for rash.  Neurological: Negative for weakness and numbness.     Physical Exam Updated Vital Signs BP 140/77   Pulse 80   Temp  98.7 F (37.1 C) (Oral)   Resp 16   Ht 5\' 2"  (1.575 m)   Wt 59 kg   SpO2 100%   BMI 23.78 kg/m   Physical Exam Vitals signs and nursing note reviewed.  Constitutional:      General: She is not in acute distress. HENT:     Head: Normocephalic.     Mouth/Throat:     Lips: Pink.     Mouth: Mucous membranes are moist.     Dentition: No dental tenderness.     Pharynx: Oropharynx is clear. Uvula midline.     Tonsils: No tonsillar exudate.      Comments: Tooth is avulsed.  No surrounding wound to the gingiva.  Eyes:     Conjunctiva/sclera: Conjunctivae normal.  Neck:     Musculoskeletal: Neck supple.  Cardiovascular:     Rate and Rhythm: Normal rate and regular rhythm.     Heart sounds: No murmur. No friction rub. No gallop.   Pulmonary:     Effort: Pulmonary effort is normal. No respiratory distress.  Abdominal:     General: There is no distension.     Palpations: Abdomen is soft.  Skin:    General: Skin is warm.     Findings: No rash.  Neurological:     Mental Status: She is alert.  Psychiatric:        Behavior: Behavior normal.      ED Treatments / Results  Labs (all labs ordered are listed, but only abnormal results are displayed) Labs Reviewed - No data to display  EKG None  Radiology No results found.  Procedures Procedures (including critical care time)  Medications Ordered in ED Medications  amoxicillin (AMOXIL) capsule 500 mg (has no administration in time range)     Initial Impression / Assessment and Plan / ED Course  I  have reviewed the triage vital signs and the nursing notes.  Pertinent labs & imaging results that were available during my care of the patient were reviewed by me and considered in my medical decision making (see chart for details).        73 year old female with a history of ovarian cancer, GERD, anxiety, and paroxysmal atrial fibrillation presenting with an avulsed tooth that occurred earlier tonight.  She is concerned  about infection because she is actively being treated for ovarian cancer.  She is scheduled for a dental procedure tomorrow with Dr. Mina Marble.  We discussed that antibiotics were not indicated at this time, but she expressed concerns about infection.  Since the tooth is completely avulsed will cover for prophylactic infection.  First dose of amoxicillin given in the ER.  The patient is agreeable with the plan at this time.  She can have repeat images performed during her appointment tomorrow afternoon with Dr. Mina Marble.  She initially had some tingling in her hands and chest tightness when she was in triage immediately after the episode occurred that is since resolved.  Low suspicion for ACS.  I suspect this is secondary to anxiety.  She is hemodynamically stable and in no acute distress.  She is safe for discharge home with outpatient follow-up at this time.  Final Clinical Impressions(s) / ED Diagnoses   Final diagnoses:  Tooth avulsion, initial encounter    ED Discharge Orders         Ordered    amoxicillin (AMOXIL) 500 MG capsule  3 times daily     02/26/18 2348           Roselinda Bahena A, PA-C 02/27/18 0009    Lacretia Leigh, MD 02/28/18 1525

## 2018-02-26 NOTE — Discharge Instructions (Addendum)
Thank you for allowing me to care for you today in the Emergency Department.   Take one tablet of amoxillin every 8 hours for the next 5 days.  Your first dose was given in the emergency department tonight.  Keep your appointment with Dr. Mina Marble tomorrow afternoon.   Return to the emergency department if you develop a high fever, thick, mucus-like drainage from the area in your mouth, if you become unable to open your jaw, if you develop severe, uncontrollable pain that does not improve with Tylenol, or other new, concerning symptoms.

## 2018-02-26 NOTE — ED Triage Notes (Signed)
Pt arrived due to a tooth falling out today, she has noticed tooth deterioration during chemo treatments. Pt stating that she doesn't feel like everything came out and she is worried about it causing a bloodstream infection. Pt denies any pain no bleeding noted to site.

## 2018-02-27 ENCOUNTER — Other Ambulatory Visit: Payer: Self-pay

## 2018-02-27 ENCOUNTER — Encounter: Payer: Self-pay | Admitting: Genetics

## 2018-02-27 DIAGNOSIS — Z801 Family history of malignant neoplasm of trachea, bronchus and lung: Secondary | ICD-10-CM | POA: Insufficient documentation

## 2018-02-27 DIAGNOSIS — Z8049 Family history of malignant neoplasm of other genital organs: Secondary | ICD-10-CM | POA: Insufficient documentation

## 2018-02-27 DIAGNOSIS — Z8042 Family history of malignant neoplasm of prostate: Secondary | ICD-10-CM | POA: Insufficient documentation

## 2018-02-27 NOTE — Addendum Note (Signed)
Addended by: Annita Brod on: 02/27/2018 01:58 PM   Modules accepted: Orders

## 2018-02-27 NOTE — Patient Outreach (Signed)
[facility]) Care Management  02/27/2018  [place] 1945/07/15 [id number]   Attempted outreach post ED visit. Spoke with Haley [name]'s spouse. Reported that she was not home at the time of the call but would relay message to contact [facility] when she returns.   PLAN Will follow up in 3-4 business days.   [place] [phone]

## 2018-02-27 NOTE — Progress Notes (Addendum)
REFERRING PROVIDER: Heath Lark, MD [place], [place] [address]  PRIMARY PROVIDER:  Martin[place], Ricky G, MD  PRIMARY REASON FOR VISIT:  1. Malignant neoplasm of ovary, unspecified laterality ([place])   2. Family history of prostate cancer   3. Family history of uterine cancer   4. Family history of lung cancer     HISTORY OF PRESENT ILLNESS:   Haley [name], a 73 y.o. female, was seen for a [facility] cancer genetics consultation at the request of Dr. Alvy Bimler due to a personal and family history of cancer.  Haley [name] presents to clinic today to discuss the possibility of a hereditary predisposition to cancer, genetic testing, and to further clarify her future cancer risks, as well as potential cancer risks for family members.   In Dec 2019, at the age of 54, Haley [name] was diagnosed with ovarian cancer. She is currently undergoing neoadjuvant chemotherapy.  She plans to undergo surgery sometime in mid-late March.   CANCER HISTORY:    Ovarian cancer ([place]) suspected   01/06/2018 Imaging    Ct abdomen and pelvis 1. Complex cystic mass at the right adnexa, measuring 5.9 x 4.0 cm, with nodular components, concerning for primary ovarian malignancy. 2. Diffuse nodularity along the omentum at the left side of the abdomen, extending into the mesentery at the left mid abdomen, concerning for peritoneal carcinomatosis. 3. Wall thickening at the distal ileum adjacent to the ovarian mass; bowel loops appear somewhat adherent to the ovarian mass. Bowel infiltration with tumor cannot be excluded. No evidence of bowel obstruction at this time. 4. Small volume ascites within the abdomen and pelvis.  Aortic Atherosclerosis (ICD10-I70.0).    01/08/2018 Tumor Marker    Patient's tumor was tested for the following markers: CA-125. Results of the tumor marker test revealed 3004    01/15/2018 Imaging    Chest CT:  1. No active cardiopulmonary disease. 2. Aortic atherosclerosis  without aneurysm or dissection. 3. No large central pulmonary embolus.  CT AP:  1. Dilated fluid-filled loops of small bowel are redemonstrated slightly more extensive than on prior exam with transition point likely in the right adnexa adjacent to a complex cystic mass concerning for ovarian neoplasm given septations and soft tissue nodularity. This raises concern for early or partial SBO. This soft tissue mass measures 4.9 x 4 x 4.7 cm and has not changed since prior recent comparison. Additional short segmental area of luminal narrowing is noted in the right lower quadrant involving small bowel for which stigmata of peritoneal carcinomatosis or small-bowel metastatic implants might account for this. 2. Redemonstration of small volume of ascites predominantly in the upper abdomen surrounding the liver and spleen. 3. Redemonstration of thick bandlike omental thickening concerning for peritoneal carcinomatosis.     01/15/2018 Procedure    Successful ultrasound-guided diagnostic and therapeutic paracentesis yielding 1.5 liters of peritoneal fluid.     01/15/2018 Pathology Results    PERITONEAL/ASCITIC FLUID (SPECIMEN 1 OF 1 COLLECTED 01/18/18): MALIGNANT CELLS CONSISTENT WITH METASTATIC ADENOCARCINOMA. SEE COMMENT. COMMENT: THE MALIGNANT CELLS ARE POSITIVE FOR MOC-31, CYTOKERATIN 7, ESTROGEN RECEPTOR, PAX-8, AND WT-1. THEY ARE NEGATIVE FOR CALRETININ, CYTOKERATIN 5/6, AND CYTOKERATIN 20. THE PROFILE IS CONSISTENT WITH A PRIMARY GYNECOLOGIC CARCINOMA. THERE IS LIKELY SUFFICIENT TUMOR PRESENT, IF ADDITIONAL STUDIES ARE REQUESTED.    01/15/2018 - 02/02/2018 Hospital Admission    She was admitted to the hospital for SBO. She was treated with chemotherapy    01/18/2018 -  Chemotherapy    The patient had  carboplatin and taxol    02/08/2018 Cancer Staging    Staging form: Ovary, Fallopian Tube, and Primary Peritoneal Carcinoma, AJCC 8th Edition - Clinical: cT3, cN0, cM0 - Signed by Heath Lark, MD on  02/08/2018    02/08/2018 Tumor Marker    Patient's tumor was tested for the following markers: CA-125. Results of the tumor marker test revealed 445      HORMONAL RISK FACTORS:  First live birth at age 32.  Ovaries intact: yes.  Hysterectomy: yes.  Menopausal status: postmenopausal.  Colonoscopy: yes; 2014- no polyps. Mammogram within the last year: yes.   Past Medical History:  Diagnosis Date  . Allergy    SEASONAL  . Cataract    BILATERAL  . Family history of lung cancer   . Family history of prostate cancer   . Family history of prostate cancer   . Family history of uterine cancer   . Fibromyalgia   . GERD (gastroesophageal reflux disease)   . H/O echocardiogram 04/28/08   EF>55% trace mitral regurgitation, No significant valvular pathology  . Hemorrhoids    internal  . History of stress test 02/08/2011   Normal Myocardial perfusion study, this is a low risk scan, No prior study available for comparison  . Hyperlipidemia   . MVP (mitral valve prolapse)    mild and MR  . OA (osteoarthritis)   . Osteoporosis   . Paroxysmal A-fib ([place])   . Sleep apnea    does not use prescribed CPAP  . Thyroid disease    HYPO  . Varicosities of leg   . Vitamin D deficiency     Past Surgical History:  Procedure Laterality Date  . ABDOMINAL HYSTERECTOMY  1989  . APPENDECTOMY  1989   with hysterectomy  . BREAST EXCISIONAL BIOPSY Left 1995   Benign  . COLONOSCOPY  11-20-2000  . IR IMAGING GUIDED PORT INSERTION  01/17/2018  . NOSE SURGERY  1990    Social History   Socioeconomic History  . Marital status: Married    Spouse name: Not on file  . Number of children: Not on file  . Years of education: Not on file  . Highest education level: Not on file  Occupational History  . Not on file  Social Needs  . Financial resource strain: Not on file  . Food insecurity:    Worry: Not on file    Inability: Not on file  . Transportation needs:    Medical: Not on file     Non-medical: Not on file  Tobacco Use  . Smoking status: Light Tobacco Smoker    Packs/day: 0.50    Years: 10.00    Pack years: 5.00    Types: Cigars  . Smokeless tobacco: Never Used  . Tobacco comment: peach flavor   Substance and Sexual Activity  . Alcohol use: No    Alcohol/week: 0.0 standard drinks  . Drug use: No  . Sexual activity: Yes    Comment: 1st intercourse 17yo-1 partner  Lifestyle  . Physical activity:    Days per week: Not on file    Minutes per session: Not on file  . Stress: Not on file  Relationships  . Social connections:    Talks on phone: Not on file    Gets together: Not on file    Attends religious service: Not on file    Active member of club or organization: Not on file    Attends meetings of clubs or organizations: Not on file  Relationship status: Not on file  Other Topics Concern  . Not on file  Social History Narrative  . Not on file     FAMILY HISTORY:  We obtained a detailed, 4-generation family history.  Significant diagnoses are listed below: Family History  Problem Relation Age of Onset  . Diabetes Daughter   . Diabetes Mother   . Hypertension Mother   . Heart disease Mother   . Stroke Maternal Grandmother   . Lung cancer Niece   . Cancer Paternal Aunt        type of cancer unk  . Prostate cancer Paternal Uncle   . Uterine cancer Cousin        dx under 28  . Colon cancer Neg Hx    Haley [name] has 2 daughters and 1 son. 1 daughter is deceased, no hx of cancer. She has 2 full sister and 3 full brothers.  1 brother had prostate cancer.  1 brother died young due to a heart attack, and his daughter (patient's niece) had lung cancer.   She also has a maternal half-sister and a maternal half-brother with no hx of cancer.   Haley [name] father: died in his mid 8's, limited information available about his health.  Paternal Aunts/Uncles: limited information, several  Paternal cousins: limited info, no known hx of cancer.  Paternal  grandfather: unk Paternal grandmother:unk  Haley [name]'s mother: died due to heart disease.  Maternal Aunts/Uncles: 1 maternal aunt and 1 maternal uncle with no known hx of cancer.  Maternal cousins: 1 cousin had uterine cancer dx under the age of 28.  Maternal grandfather: unk Maternal grandmother:unk  Haley [name] is unaware of previous family history of genetic testing for hereditary cancer risks. Patient's ancestors are of [place]. [place], [place], [place], Native, [place], [place]  descent, There is no reported Ashkenazi Jewish ancestry. There is no known consanguinity.  GENETIC COUNSELING ASSESSMENT: FEDERICA ALLPORT is a 73 y.o. female with a personal hisotry which is somewhat suggestive of a Hereditary Cancer Predisposition Syndrome. We, therefore, discussed and recommended the following at today's visit.   DISCUSSION: We reviewed the characteristics, features and inheritance patterns of hereditary cancer syndromes. We also discussed genetic testing, including the appropriate family members to test, the process of testing, insurance coverage and turn-around-time for results. We discussed the implications of a negative, positive and/or variant of uncertain significant result. We recommended Haley [name] pursue genetic testing for the TumorNextHRD + CancerNext gene panel.   The CancerNext gene panel offered by [facility] includes sequencing and rearrangement analysis for the following 32 genes:   APC, ATM, BARD1, BMPR1A, BRCA1, BRCA2, BRIP1, CDH1, CDK4, CDKN2A, CHEK2, DICER1, EPCAM, GREM1, HOXB13MLH1, MRE11A, MSH2, MSH6, MUTYH, NBN, NF1, PALB2, PMS2, POLD1, POLE, PTEN, RAD50, RAD51C, RAD51D, SMAD4, SMARCA4, STK11, and TP53.   We discussed that genetic testing through [facility] will test for hereditary mutations that could explain her diagnosis of cancer.  However, homologous recombination testing (HRD) is genetic testing performed on her tumor that can determine genetic changes that could influence  her management.  HRD testing is performed in tandem with genetic testing, and typically at no additional cost.   We discussed that about 20-25% of ovarian cancers are due to a hereditary cancer predisposition syndrome.  One of the most common hereditary cancer syndromes that increases ovarian cancer risk is called Hereditary Breast and Ovarian Cancer (HBOC) syndrome.  This syndrome is caused by mutations in the BRCA1 and BRCA2 genes.  This syndrome increases an individual's  lifetime risk to develop breast, ovarian, pancreatic, and other types of cancer.  There are also many other cancer predisposition syndromes caused by mutations in several other genes.  We discussed that if she is found to have a mutation in one of these genes, it may impact future medical management recommendations such as increased cancer screenings and consideration of risk reducing surgeries.  A positive result could also have implications for the patient's family members.  A Negative result would mean we were unable to identify a hereditary component to her cancer, but does not rule out the possibility of a hereditary basis for her cancer.  There could be mutations that are undetectable by current technology, or in genes not yet tested or identified to increase cancer risk.    We discussed the potential to find a Variant of Uncertain Significance or VUS.  These are variants that have not yet been identified as pathogenic or benign, and it is unknown if this variant is associated with increased cancer risk or if this is a normal finding.  Most VUS's are reclassified to benign or likely benign.   It should not be used to make medical management decisions. With time, we suspect the lab will determine the significance of any VUS's identified if any.   Based on Ms. Smoker's personal and family history of cancer, she meets medical criteria for genetic testing. Despite that she meets criteria, she may still have an out of pocket cost. The  laboratory can provide her with an estimate of her OOP cost.  she was given the contact information for the laboratory if she has further questions.    PLAN: After considering the risks, benefits, and limitations, Haley [name]  provided informed consent to pursue genetic testing.  We will obtain a benefit investigation from the laboratory for her.  Genetic counselor, Faith Rogue consulted with her oncologist Dr. Alvy Bimler, and it was determined we would wait until after her surgery (planned for middle/end of March) at which point we will have a tumor sample to send in tandem with a blood sample.  We will coordinate this blood draw at a later date.    After both samples can be obtained, they will be sent to [facility] for analysis of the TumorNextHRD +CancerNext Panel. Results should be available within approximately 4-6 weeks' time (after sample) at which point they will be disclosed by telephone to Haley [name], as will any additional recommendations warranted by these results. Haley [name] will receive a summary of her genetic counseling visit and a copy of her results once available. This information will also be available in Epic. We encouraged Haley [name] to remain in contact with cancer genetics annually so that we can continuously update the family history and inform her of any changes in cancer genetics and testing that may be of benefit for her family. Haley [name] questions were answered to her satisfaction today. Our contact information was provided should additional questions or concerns arise.  Lastly, we encouraged Haley [name] to remain in contact with cancer genetics annually so that we can continuously update the family history and inform her of any changes in cancer genetics and testing that may be of benefit for this family.   Ms.  [name] questions were answered to her satisfaction today. Our contact information was provided should additional questions or concerns arise. Thank you for the  referral and allowing [place] to share in the care of your patient.   Tana Felts, MS, [facility] Certified Genetic  Counselor [email or web address] phone: [phone]  The patient was seen for a total of 60 minutes in face-to-face genetic counseling.  The patient was accompanied today by her daughter, Marcie Bal. This patient was discussed with Drs. Magrinat, Lindi Adie and/or Burr Medico who agrees with the above.

## 2018-02-28 ENCOUNTER — Telehealth: Payer: Self-pay | Admitting: Oncology

## 2018-02-28 NOTE — Telephone Encounter (Signed)
Haley [name] called and said she had her tooth pulled yesterday and had a bone and tissue graft by Dr. Mina Marble at [facility].  She said she was told it will take a couple weeks to fully heal and is wondering if she should have chemo on Friday.  Advised her to keep her appointments for lab and to see Dr. Alvy Bimler on Friday to have it evaluated.

## 2018-03-01 ENCOUNTER — Other Ambulatory Visit: Payer: Self-pay

## 2018-03-01 ENCOUNTER — Telehealth: Payer: Self-pay | Admitting: Oncology

## 2018-03-01 NOTE — Patient Outreach (Signed)
[facility]) Care Management  03/01/2018  Haley [name] May 21, 1945 [id number]  Successful follow-up outreach. Haley [name] denied complaints of mouth pain, swelling or bleeding since being evaluated in the ED on 02/26/18. Reported taking oral antibiotics three times a day as prescribed. Denied difficulty chewing or swallowing. Verbalized awareness of worsening s/sx that require immediate medical attention. She reported doing well, denied urgent concerns and agreed to contact RNCM if needed. PLAN Will continue routine outreach.   [place] [phone]

## 2018-03-01 NOTE — Telephone Encounter (Signed)
Maniyah called and asked what she needs to do if she can't make it in tomorrow due to the snow.  Advised her to call the [phone] number tomorrow morning and ask for Dr. Calton Dach nurse if she does not think she will be able to make it in.  She verbalized agreement.

## 2018-03-02 ENCOUNTER — Inpatient Hospital Stay: Payer: PPO

## 2018-03-02 ENCOUNTER — Inpatient Hospital Stay (HOSPITAL_BASED_OUTPATIENT_CLINIC_OR_DEPARTMENT_OTHER): Payer: PPO | Admitting: Hematology and Oncology

## 2018-03-02 ENCOUNTER — Encounter: Payer: Self-pay | Admitting: Hematology and Oncology

## 2018-03-02 ENCOUNTER — Telehealth: Payer: Self-pay | Admitting: Hematology and Oncology

## 2018-03-02 DIAGNOSIS — Z79899 Other long term (current) drug therapy: Secondary | ICD-10-CM | POA: Diagnosis not present

## 2018-03-02 DIAGNOSIS — T451X5A Adverse effect of antineoplastic and immunosuppressive drugs, initial encounter: Secondary | ICD-10-CM

## 2018-03-02 DIAGNOSIS — Z7901 Long term (current) use of anticoagulants: Secondary | ICD-10-CM

## 2018-03-02 DIAGNOSIS — C569 Malignant neoplasm of unspecified ovary: Secondary | ICD-10-CM

## 2018-03-02 DIAGNOSIS — D6481 Anemia due to antineoplastic chemotherapy: Secondary | ICD-10-CM

## 2018-03-02 DIAGNOSIS — C801 Malignant (primary) neoplasm, unspecified: Secondary | ICD-10-CM | POA: Diagnosis not present

## 2018-03-02 DIAGNOSIS — M79606 Pain in leg, unspecified: Secondary | ICD-10-CM

## 2018-03-02 DIAGNOSIS — Z5111 Encounter for antineoplastic chemotherapy: Secondary | ICD-10-CM | POA: Diagnosis not present

## 2018-03-02 DIAGNOSIS — I480 Paroxysmal atrial fibrillation: Secondary | ICD-10-CM

## 2018-03-02 DIAGNOSIS — C786 Secondary malignant neoplasm of retroperitoneum and peritoneum: Secondary | ICD-10-CM | POA: Diagnosis not present

## 2018-03-02 LAB — CMP (CANCER CENTER ONLY)
ALT: 8 U/L (ref 0–44)
ANION GAP: 11 (ref 5–15)
AST: 11 U/L — ABNORMAL LOW (ref 15–41)
Albumin: 3.4 g/dL — ABNORMAL LOW (ref 3.5–5.0)
Alkaline Phosphatase: 92 U/L (ref 38–126)
BUN: 13 mg/dL (ref 8–23)
CO2: 22 mmol/L (ref 22–32)
Calcium: 9.1 mg/dL (ref 8.9–10.3)
Chloride: 104 mmol/L (ref 98–111)
Creatinine: 0.79 mg/dL (ref 0.44–1.00)
GFR, Estimated: >60 mL/min (ref >60)
Glucose, Bld: 195 mg/dL — ABNORMAL HIGH (ref 70–99)
POTASSIUM: 4 mmol/L (ref 3.5–5.1)
Sodium: 137 mmol/L (ref 135–145)
Total Bilirubin: 0.2 mg/dL — ABNORMAL LOW (ref 0.3–1.2)
Total Protein: 7.1 g/dL (ref 6.5–8.1)

## 2018-03-02 LAB — CBC WITH DIFFERENTIAL (CANCER CENTER ONLY)
Abs Immature Granulocytes: 0.04 10*3/uL (ref 0.00–0.07)
BASOS PCT: 0 %
Basophils Absolute: 0 10*3/uL (ref 0.0–0.1)
Eosinophils Absolute: 0 10*3/uL (ref 0.0–0.5)
Eosinophils Relative: 0 %
HCT: 32.6 % — ABNORMAL LOW (ref 36.0–46.0)
Hemoglobin: 10.3 g/dL — ABNORMAL LOW (ref 12.0–15.0)
Immature Granulocytes: 1 %
Lymphocytes Relative: 16 %
Lymphs Abs: 1 10*3/uL (ref 0.7–4.0)
MCH: 29 pg (ref 26.0–34.0)
MCHC: 31.6 g/dL (ref 30.0–36.0)
MCV: 91.8 fL (ref 80.0–100.0)
Monocytes Absolute: 0 10*3/uL — ABNORMAL LOW (ref 0.1–1.0)
Monocytes Relative: 1 %
Neutro Abs: 4.8 10*3/uL (ref 1.7–7.7)
Neutrophils Relative %: 82 %
Platelet Count: 200 10*3/uL (ref 150–400)
RBC: 3.55 MIL/uL — ABNORMAL LOW (ref 3.87–5.11)
RDW: 16.3 % — ABNORMAL HIGH (ref 11.5–15.5)
WBC Count: 5.8 10*3/uL (ref 4.0–10.5)
nRBC: 0 % (ref 0.0–0.2)

## 2018-03-02 MED ORDER — HEPARIN SOD (PORK) LOCK FLUSH 100 UNIT/ML IV SOLN
500.0000 [IU] | Freq: Once | INTRAVENOUS | Status: AC | PRN
  Administered 2018-03-02: 500 [IU]
  Filled 2018-03-02: qty 5

## 2018-03-02 MED ORDER — SODIUM CHLORIDE 0.9% FLUSH
10.0000 mL | Freq: Once | INTRAVENOUS | Status: AC
  Administered 2018-03-02: 10 mL
  Filled 2018-03-02: qty 10

## 2018-03-02 MED ORDER — DIPHENHYDRAMINE HCL 50 MG/ML IJ SOLN
12.5000 mg | Freq: Once | INTRAMUSCULAR | Status: AC
  Administered 2018-03-02: 12.5 mg via INTRAVENOUS

## 2018-03-02 MED ORDER — DIPHENHYDRAMINE HCL 50 MG/ML IJ SOLN
INTRAMUSCULAR | Status: AC
  Filled 2018-03-02: qty 1

## 2018-03-02 MED ORDER — PALONOSETRON HCL INJECTION 0.25 MG/5ML
0.2500 mg | Freq: Once | INTRAVENOUS | Status: AC
  Administered 2018-03-02: 0.25 mg via INTRAVENOUS

## 2018-03-02 MED ORDER — SODIUM CHLORIDE 0.9 % IV SOLN
175.0000 mg/m2 | Freq: Once | INTRAVENOUS | Status: AC
  Administered 2018-03-02: 270 mg via INTRAVENOUS
  Filled 2018-03-02: qty 45

## 2018-03-02 MED ORDER — SODIUM CHLORIDE 0.9 % IV SOLN
Freq: Once | INTRAVENOUS | Status: AC
  Administered 2018-03-02: 11:00:00 via INTRAVENOUS
  Filled 2018-03-02: qty 5

## 2018-03-02 MED ORDER — SODIUM CHLORIDE 0.9 % IV SOLN
Freq: Once | INTRAVENOUS | Status: AC
  Administered 2018-03-02: 11:00:00 via INTRAVENOUS
  Filled 2018-03-02: qty 250

## 2018-03-02 MED ORDER — FAMOTIDINE IN NACL 20-0.9 MG/50ML-% IV SOLN
INTRAVENOUS | Status: AC
  Filled 2018-03-02: qty 50

## 2018-03-02 MED ORDER — FAMOTIDINE IN NACL 20-0.9 MG/50ML-% IV SOLN: 20.0000 mg | Freq: Once | INTRAVENOUS | Status: DC

## 2018-03-02 MED ORDER — PALONOSETRON HCL INJECTION 0.25 MG/5ML
INTRAVENOUS | Status: AC
  Filled 2018-03-02: qty 5

## 2018-03-02 MED ORDER — OXYCODONE HCL 5 MG PO TABS: 5.0000 mg | ORAL_TABLET | ORAL | 0 refills | Status: DC | PRN

## 2018-03-02 MED ORDER — SODIUM CHLORIDE 0.9% FLUSH
10.0000 mL | INTRAVENOUS | Status: DC | PRN
  Administered 2018-03-02: 10 mL
  Filled 2018-03-02: qty 10

## 2018-03-02 MED ORDER — SODIUM CHLORIDE 0.9 % IV SOLN
20.0000 mg | Freq: Once | INTRAVENOUS | Status: AC
  Administered 2018-03-02: 20 mg via INTRAVENOUS
  Filled 2018-03-02: qty 2

## 2018-03-02 MED ORDER — SODIUM CHLORIDE 0.9 % IV SOLN
405.6000 mg | Freq: Once | INTRAVENOUS | Status: AC
  Administered 2018-03-02: 410 mg via INTRAVENOUS
  Filled 2018-03-02: qty 41

## 2018-03-02 NOTE — Assessment & Plan Note (Addendum)
Overall, she has responded well to chemotherapy with near complete resolution of her symptoms of nausea, abdominal bloating and pain Tumor marker is improving She has no detectable ascites on exam We will proceed with cycle 3 of neoadjuvant chemotherapy as scheduled She has CT scan scheduled in 2 weeks If her CT scan show favorable response, she has appointment to meet with GYN oncologist for interval debulking surgery However, if needed, we will tentatively schedule her for cycle 4 of chemotherapy in 3 weeks

## 2018-03-02 NOTE — Progress Notes (Signed)
[name] OFFICE PROGRESS NOTE  Patient Care Team: [place], Janaysha G, MD as PCP - General (Family Medicine) Troy Sine, MD as PCP - Cardiology (Cardiology) Lavera Guise, [facility] as [place] Management (Pharmacist) Neldon Labella, RN as [place] Management Hess, Craige Cotta, RN as Oncology Nurse Navigator (Oncology)  ASSESSMENT & PLAN:  Ovarian cancer [facility]) suspected Overall, she has responded well to chemotherapy with near complete resolution of her symptoms of nausea, abdominal bloating and pain Tumor marker is improving She has no detectable ascites on exam We will proceed with cycle 3 of neoadjuvant chemotherapy as scheduled She has CT scan scheduled in 2 weeks If her CT scan show favorable response, she has appointment to meet with GYN oncologist for interval debulking surgery However, if needed, we will tentatively schedule her for cycle 4 of chemotherapy in 3 weeks  Anemia due to antineoplastic chemotherapy This is likely due to recent treatment. The patient denies recent history of bleeding such as epistaxis, hematuria or hematochezia. She is asymptomatic from the anemia. I will observe for now.  She does not require transfusion now. I will continue the chemotherapy at current dose without dosage adjustment.  If the anemia gets progressive worse in the future, I might have to delay her treatment or adjust the chemotherapy dose.   PAF (paroxysmal atrial fibrillation) ([place]) She is currently in normal sinus rhythm She might need cardiology clearance before proceed with surgery due to current use of anticoagulation therapy  Lower extremity pain We discussed extensively about chronic pain management We discussed narcotic refill policy Per patient request, I have sent a new prescription to her local pharmacy   No orders of the defined types were placed in this encounter.   INTERVAL HISTORY: Please see below for problem  oriented charting. She returns for further follow-up She had recent dental work done by her dentist The patient denies any recent signs or symptoms of bleeding such as spontaneous epistaxis, hematuria or hematochezia. She denies recurrence of abdominal bloating, nausea or constipation No peripheral neuropathy Her foot pain is well controlled with current prescription pain medicine  SUMMARY OF ONCOLOGIC HISTORY:   Ovarian cancer ([place]) suspected   01/06/2018 Imaging    Ct abdomen and pelvis 1. Complex cystic mass at the right adnexa, measuring 5.9 x 4.0 cm, with nodular components, concerning for primary ovarian malignancy. 2. Diffuse nodularity along the omentum at the left side of the abdomen, extending into the mesentery at the left mid abdomen, concerning for peritoneal carcinomatosis. 3. Wall thickening at the distal ileum adjacent to the ovarian mass; bowel loops appear somewhat adherent to the ovarian mass. Bowel infiltration with tumor cannot be excluded. No evidence of bowel obstruction at this time. 4. Small volume ascites within the abdomen and pelvis.  Aortic Atherosclerosis (ICD10-I70.0).    01/08/2018 Tumor Marker    Patient's tumor was tested for the following markers: CA-125. Results of the tumor marker test revealed 3004    01/15/2018 Imaging    Chest CT:  1. No active cardiopulmonary disease. 2. Aortic atherosclerosis without aneurysm or dissection. 3. No large central pulmonary embolus.  CT AP:  1. Dilated fluid-filled loops of small bowel are redemonstrated slightly more extensive than on prior exam with transition point likely in the right adnexa adjacent to a complex cystic mass concerning for ovarian neoplasm given septations and soft tissue nodularity. This raises concern for early or partial SBO. This soft tissue mass measures 4.9 x  4 x 4.7 cm and has not changed since prior recent comparison. Additional short segmental area of luminal narrowing is noted in  the right lower quadrant involving small bowel for which stigmata of peritoneal carcinomatosis or small-bowel metastatic implants might account for this. 2. Redemonstration of small volume of ascites predominantly in the upper abdomen surrounding the liver and spleen. 3. Redemonstration of thick bandlike omental thickening concerning for peritoneal carcinomatosis.     01/15/2018 Procedure    Successful ultrasound-guided diagnostic and therapeutic paracentesis yielding 1.5 liters of peritoneal fluid.     01/15/2018 Pathology Results    PERITONEAL/ASCITIC FLUID (SPECIMEN 1 OF 1 COLLECTED 01/18/18): MALIGNANT CELLS CONSISTENT WITH METASTATIC ADENOCARCINOMA. SEE COMMENT. COMMENT: THE MALIGNANT CELLS ARE POSITIVE FOR MOC-31, CYTOKERATIN 7, ESTROGEN RECEPTOR, PAX-8, AND WT-1. THEY ARE NEGATIVE FOR CALRETININ, CYTOKERATIN 5/6, AND CYTOKERATIN 20. THE PROFILE IS CONSISTENT WITH A PRIMARY GYNECOLOGIC CARCINOMA. THERE IS LIKELY SUFFICIENT TUMOR PRESENT, IF ADDITIONAL STUDIES ARE REQUESTED.    01/15/2018 - 02/02/2018 Hospital Admission    She was admitted to the hospital for SBO. She was treated with chemotherapy    01/18/2018 -  Chemotherapy    The patient had carboplatin and taxol    02/08/2018 Cancer Staging    Staging form: Ovary, Fallopian Tube, and Primary Peritoneal Carcinoma, AJCC 8th Edition - Clinical: cT3, cN0, cM0 - Signed by Heath Lark, MD on 02/08/2018    02/08/2018 Tumor Marker    Patient's tumor was tested for the following markers: CA-125. Results of the tumor marker test revealed 445     REVIEW OF SYSTEMS:   Constitutional: Denies fevers, chills or abnormal weight loss Eyes: Denies blurriness of vision Ears, nose, mouth, throat, and face: Denies mucositis or sore throat Respiratory: Denies cough, dyspnea or wheezes Cardiovascular: Denies palpitation, chest discomfort or lower extremity swelling Gastrointestinal:  Denies nausea, heartburn or change in bowel habits Skin: Denies  abnormal skin rashes Lymphatics: Denies new lymphadenopathy or easy bruising Neurological:Denies numbness, tingling or new weaknesses Behavioral/Psych: Mood is stable, no new changes  All other systems were reviewed with the patient and are negative.  I have reviewed the past medical history, past surgical history, social history and family history with the patient and they are unchanged from previous note.  ALLERGIES:  is allergic to tramadol hcl and tramadol.  MEDICATIONS:  Current Outpatient Medications  Medication Sig Dispense Refill  . ALPRAZolam (XANAX) 0.5 MG tablet TAKE ONE TABLET BY MOUTH ONCE DAILY AS NEEDED (Patient taking differently: Take 0.25-0.5 mg by mouth daily as needed for anxiety. TAKE ONE TABLET BY MOUTH ONCE DAILY AS NEEDED) 90 tablet 3  . amoxicillin (AMOXIL) 500 MG capsule Take 1 capsule (500 mg total) by mouth 3 (three) times daily for 5 days. 15 capsule 0  . Calcium Carbonate (CALTRATE 600) 1500 MG TABS Take by mouth daily.     . Carboxymethylcellulose Sodium (EYE DROPS OP) Apply 1 drop to eye daily as needed. Thera Tears    . cholecalciferol (VITAMIN D) 1000 UNITS tablet Take 1,000 Units by mouth daily.     Marland Kitchen dexamethasone (DECADRON) 4 MG tablet Take 2 tabs at the night before and 2 tabs the morning of chemotherapy, every 3 weeks, by mouth 20 tablet 0  . fluticasone (FLONASE) 50 MCG/ACT nasal spray Use 2 sprays in each nostril daily 48 g 2  . ibuprofen (ADVIL,MOTRIN) 200 MG tablet Take 600 mg by mouth every 6 (six) hours as needed for moderate pain.     Marland Kitchen lactose free  nutrition (BOOST PLUS) LIQD Take 237 mLs by mouth daily. 10 Can 0  . levothyroxine (SYNTHROID, LEVOTHROID) 75 MCG tablet Take 1 tablet (75 mcg total) by mouth daily. 90 tablet 3  . lidocaine-prilocaine (EMLA) cream Apply 1 application topically as needed. 30 g 6  . Menthol-Methyl Salicylate (MUSCLE RUB) 10-15 % CREA Apply 1 application topically as needed for muscle pain.    . metoprolol succinate  (TOPROL-XL) 50 MG 24 hr tablet Take 1 tablet (50 mg total) by mouth daily. Take with or immediately following a meal. 90 tablet 3  . Multiple Vitamins-Minerals (MULTIVITAMIN WITH MINERALS) tablet Take 1 tablet by mouth daily.      . Omega-3 Fatty Acids (FISH OIL) 1000 MG CAPS Take 2 capsules by mouth 2 (two) times daily.    Marland Kitchen omeprazole (PRILOSEC) 40 MG capsule Take 40 mg by mouth daily.    . ondansetron (ZOFRAN) 8 MG tablet Take 1 tablet (8 mg total) by mouth every 8 (eight) hours as needed for nausea. 30 tablet 3  . oxyCODONE (OXY IR/ROXICODONE) 5 MG immediate release tablet Take 1 tablet (5 mg total) by mouth every 4 (four) hours as needed for severe pain. 30 tablet 0  . polyethylene glycol (MIRALAX / GLYCOLAX) packet Take 17 g by mouth daily. (Patient taking differently: Take 17 g by mouth 2 (two) times daily. ) 14 each 0  . prochlorperazine (COMPAZINE) 10 MG tablet Take 1 tablet (10 mg total) by mouth every 6 (six) hours as needed for nausea or vomiting. 30 tablet 0  . rivaroxaban (XARELTO) 20 MG TABS tablet Take 1 tablet (20 mg total) by mouth daily with supper. 14 tablet 0  . simvastatin (ZOCOR) 20 MG tablet Take 1 tablet (20 mg total) by mouth at bedtime. 90 tablet 3  . vitamin C (ASCORBIC ACID) 500 MG tablet Take 500 mg by mouth 2 (two) times daily.      No current facility-administered medications for this visit.    Facility-Administered Medications Ordered in Other Visits  Medication Dose Route Frequency Provider Last Rate Last Dose  . CARBOplatin (PARAPLATIN) 410 mg in sodium chloride 0.9 % 250 mL chemo infusion  410 mg Intravenous Once Alvy Bimler, Miya Luviano, MD      . heparin lock flush 100 unit/mL  500 Units Intracatheter Once PRN Alvy Bimler, Lannette Avellino, MD      . PACLitaxel (TAXOL) 270 mg in sodium chloride 0.9 % 250 mL chemo infusion (> 80mg /m2)  175 mg/m2 (Treatment Plan Adjusted) Intravenous Once Alvy Bimler, Jason Hauge, MD 98 mL/hr at 03/02/18 1235 270 mg at 03/02/18 1235  . sodium chloride flush (NS) 0.9 %  injection 10 mL  10 mL Intracatheter PRN Alvy Bimler, Ananda Caya, MD        PHYSICAL EXAMINATION: ECOG PERFORMANCE STATUS: 1 - Symptomatic but completely ambulatory  Vitals:   03/02/18 0958  BP: (!) 159/72  Pulse: 66  Resp: 18  Temp: 97.8 F (36.6 C)  SpO2: 100%   Filed Weights   03/02/18 0958  Weight: 134 lb (60.8 kg)    GENERAL:alert, no distress and comfortable SKIN: skin color, texture, turgor are normal, no rashes or significant lesions EYES: normal, Conjunctiva are pink and non-injected, sclera clear OROPHARYNX:no exudate, no erythema and lips, buccal mucosa, and tongue normal  NECK: supple, thyroid normal size, non-tender, without nodularity LYMPH:  no palpable lymphadenopathy in the cervical, axillary or inguinal LUNGS: clear to auscultation and percussion with normal breathing effort HEART: regular rate & rhythm and no murmurs and no lower extremity  edema ABDOMEN:abdomen soft, non-tender and normal bowel sounds Musculoskeletal:no cyanosis of digits and no clubbing  NEURO: alert & oriented x 3 with fluent speech, no focal motor/sensory deficits  LABORATORY DATA:  I have reviewed the data as listed    Component Value Date/Time   NA 137 03/02/2018 0908   K 4.0 03/02/2018 0908   CL 104 03/02/2018 0908   CO2 22 03/02/2018 0908   GLUCOSE 195 (H) 03/02/2018 0908   BUN 13 03/02/2018 0908   CREATININE 0.79 03/02/2018 0908   CREATININE 0.76 11/17/2015 1005   CALCIUM 9.1 03/02/2018 0908   PROT 7.1 03/02/2018 0908   ALBUMIN 3.4 (L) 03/02/2018 0908   AST 11 (L) 03/02/2018 0908   ALT 8 03/02/2018 0908   ALKPHOS 92 03/02/2018 0908   BILITOT 0.2 (L) 03/02/2018 0908   GFRNONAA >60 03/02/2018 0908   GFRAA >60 03/02/2018 0908    No results found for: SPEP, UPEP  Lab Results  Component Value Date   WBC 5.8 03/02/2018   NEUTROABS 4.8 03/02/2018   HGB 10.3 (L) 03/02/2018   HCT 32.6 (L) 03/02/2018   MCV 91.8 03/02/2018   PLT 200 03/02/2018      Chemistry      Component  Value Date/Time   NA 137 03/02/2018 0908   K 4.0 03/02/2018 0908   CL 104 03/02/2018 0908   CO2 22 03/02/2018 0908   BUN 13 03/02/2018 0908   CREATININE 0.79 03/02/2018 0908   CREATININE 0.76 11/17/2015 1005      Component Value Date/Time   CALCIUM 9.1 03/02/2018 0908   ALKPHOS 92 03/02/2018 0908   AST 11 (L) 03/02/2018 0908   ALT 8 03/02/2018 0908   BILITOT 0.2 (L) 03/02/2018 0908       RADIOGRAPHIC STUDIES: I have personally reviewed the radiological images as listed and agreed with the findings in the report. Vas [place] Lower Extremity Venous (dvt)  Result Date: 02/02/2018  Lower Venous Study Indications: Swelling, and Pain.  Risk Factors: Cancer Ovarian Cancer, Peritoneal carcinomatosis. Limitations: Body habitus. Comparison Study: RLEV exam on 07/27/2016. Performing Technologist: Rudell Cobb  Examination Guidelines: A complete evaluation includes B-mode imaging, spectral Doppler, color Doppler, and power Doppler as needed of all accessible portions of each vessel. Bilateral testing is considered an integral part of a complete examination. Limited examinations for reoccurring indications may be performed as noted.  Right Venous Findings: +---------+---------------+---------+-----------+----------+-------+          CompressibilityPhasicitySpontaneityPropertiesSummary +---------+---------------+---------+-----------+----------+-------+ CFV      Full           No       Yes                          +---------+---------------+---------+-----------+----------+-------+ SFJ      Full                                                 +---------+---------------+---------+-----------+----------+-------+ FV Prox  Full                                                 +---------+---------------+---------+-----------+----------+-------+ FV Mid   Full                                                 +---------+---------------+---------+-----------+----------+-------+  FV  DistalFull                                                 +---------+---------------+---------+-----------+----------+-------+ PFV      Full                                                 +---------+---------------+---------+-----------+----------+-------+ POP      Full           No       Yes                  Dilated +---------+---------------+---------+-----------+----------+-------+ PTV      Full                                                 +---------+---------------+---------+-----------+----------+-------+ PERO     Full                                                 +---------+---------------+---------+-----------+----------+-------+  Left Venous Findings: +---------+---------------+---------+-----------+----------+-------+          CompressibilityPhasicitySpontaneityPropertiesSummary +---------+---------------+---------+-----------+----------+-------+ CFV      Full           Yes      Yes                          +---------+---------------+---------+-----------+----------+-------+ SFJ      Full                                                 +---------+---------------+---------+-----------+----------+-------+ FV Prox  Full                                                 +---------+---------------+---------+-----------+----------+-------+ FV Mid   Full                                                 +---------+---------------+---------+-----------+----------+-------+ FV DistalFull                                                 +---------+---------------+---------+-----------+----------+-------+ PFV      Full                                                 +---------+---------------+---------+-----------+----------+-------+ POP  Full           No       Yes                          +---------+---------------+---------+-----------+----------+-------+ PTV      Full                                                  +---------+---------------+---------+-----------+----------+-------+ PERO     Full                                                 +---------+---------------+---------+-----------+----------+-------+    Summary: Right: There is no evidence of deep vein thrombosis in the lower extremity. However, the CFV and Popliteal vein are lack of phasicity. No cystic structure found in the popliteal fossa. Left: There is no evidence of deep vein thrombosis in the lower extremity. Noticed the popliteal vein is lack of phasicity. No cystic structure found in the popliteal fossa.  *See table(s) above for measurements and observations. Electronically signed by Servando Snare MD on 02/02/2018 at 12:09:30 PM.    Final     All questions were answered. The patient knows to call the clinic with any problems, questions or concerns. No barriers to learning was detected.  I spent 15 minutes counseling the patient face to face. The total time spent in the appointment was 20 minutes and more than 50% was on counseling and review of test results  Heath Lark, MD 03/02/2018 3:30 PM

## 2018-03-02 NOTE — Assessment & Plan Note (Signed)

## 2018-03-02 NOTE — Assessment & Plan Note (Signed)
We discussed extensively about chronic pain management We discussed narcotic refill policy Per patient request, I have sent a new prescription to her local pharmacy

## 2018-03-02 NOTE — Patient Instructions (Signed)
[facility] Discharge Instructions for Patients Receiving Chemotherapy  Today you received the following chemotherapy agents: Taxol, Carboplatin.  To help prevent nausea and vomiting after your treatment, we encourage you to take your nausea medication as prescribed.   If you develop nausea and vomiting that is not controlled by your nausea medication, call the clinic.   BELOW ARE SYMPTOMS THAT SHOULD BE REPORTED IMMEDIATELY:  *FEVER GREATER THAN 100.5 F  *CHILLS WITH OR WITHOUT FEVER  NAUSEA AND VOMITING THAT IS NOT CONTROLLED WITH YOUR NAUSEA MEDICATION  *UNUSUAL SHORTNESS OF BREATH  *UNUSUAL BRUISING OR BLEEDING  TENDERNESS IN MOUTH AND THROAT WITH OR WITHOUT PRESENCE OF ULCERS  *URINARY PROBLEMS  *BOWEL PROBLEMS  UNUSUAL RASH Items with * indicate a potential emergency and should be followed up as soon as possible.  Feel free to call the clinic should you have any questions or concerns. The clinic phone number is [phone].  Please show the [place] at check-in to the Emergency Department and triage nurse.  Paclitaxel injection (Taxol) What is this medicine? PACLITAXEL (PAK li TAX el) is a chemotherapy drug. It targets fast dividing cells, like cancer cells, and causes these cells to die. This medicine is used to treat ovarian cancer, breast cancer, lung cancer, Kaposi's sarcoma, and other cancers. This medicine may be used for other purposes; ask your health care provider or pharmacist if you have questions. COMMON BRAND NAME(S): Onxol, Taxol What should I tell my health care provider before I take this medicine? They need to know if you have any of these conditions: -history of irregular heartbeat -liver disease -low blood counts, like low white cell, platelet, or red cell counts -lung or breathing disease, like asthma -tingling of the fingers or toes, or other nerve disorder -an unusual or allergic reaction to paclitaxel, alcohol,  polyoxyethylated castor oil, other chemotherapy, other medicines, foods, dyes, or preservatives -pregnant or trying to get pregnant -breast-feeding How should I use this medicine? This drug is given as an infusion into a vein. It is administered in a hospital or clinic by a specially trained health care professional. Talk to your pediatrician regarding the use of this medicine in children. Special care may be needed. Overdosage: If you think you have taken too much of this medicine contact a poison control center or emergency room at once. NOTE: This medicine is only for you. Do not share this medicine with others. What if I miss a dose? It is important not to miss your dose. Call your doctor or health care professional if you are unable to keep an appointment. What may interact with this medicine? Do not take this medicine with any of the following medications: -disulfiram -metronidazole This medicine may also interact with the following medications: -antiviral medicines for hepatitis, HIV or AIDS -certain antibiotics like erythromycin and clarithromycin -certain medicines for fungal infections like ketoconazole and itraconazole -certain medicines for seizures like carbamazepine, phenobarbital, phenytoin -gemfibrozil -nefazodone -rifampin -St. John's wort This list may not describe all possible interactions. Give your health care provider a list of all the medicines, herbs, non-prescription drugs, or dietary supplements you use. Also tell them if you smoke, drink alcohol, or use illegal drugs. Some items may interact with your medicine. What should I watch for while using this medicine? Your condition will be monitored carefully while you are receiving this medicine. You will need important blood work done while you are taking this medicine. This medicine can cause serious allergic reactions. To reduce  your risk you will need to take other medicine(s) before treatment with this medicine.  If you experience allergic reactions like skin rash, itching or hives, swelling of the face, lips, or tongue, tell your doctor or health care professional right away. In some cases, you may be given additional medicines to help with side effects. Follow all directions for their use. This drug may make you feel generally unwell. This is not uncommon, as chemotherapy can affect healthy cells as well as cancer cells. Report any side effects. Continue your course of treatment even though you feel ill unless your doctor tells you to stop. Call your doctor or health care professional for advice if you get a fever, chills or sore throat, or other symptoms of a cold or flu. Do not treat yourself. This drug decreases your body's ability to fight infections. Try to avoid being around people who are sick. This medicine may increase your risk to bruise or bleed. Call your doctor or health care professional if you notice any unusual bleeding. Be careful brushing and flossing your teeth or using a toothpick because you may get an infection or bleed more easily. If you have any dental work done, tell your dentist you are receiving this medicine. Avoid taking products that contain aspirin, acetaminophen, ibuprofen, naproxen, or ketoprofen unless instructed by your doctor. These medicines may hide a fever. Do not become pregnant while taking this medicine. Women should inform their doctor if they wish to become pregnant or think they might be pregnant. There is a potential for serious side effects to an unborn child. Talk to your health care professional or pharmacist for more information. Do not breast-feed an infant while taking this medicine. Men are advised not to father a child while receiving this medicine. This product may contain alcohol. Ask your pharmacist or healthcare provider if this medicine contains alcohol. Be sure to tell all healthcare providers you are taking this medicine. Certain medicines, like  metronidazole and disulfiram, can cause an unpleasant reaction when taken with alcohol. The reaction includes flushing, headache, nausea, vomiting, sweating, and increased thirst. The reaction can last from 30 minutes to several hours. What side effects may I notice from receiving this medicine? Side effects that you should report to your doctor or health care professional as soon as possible: -allergic reactions like skin rash, itching or hives, swelling of the face, lips, or tongue -breathing problems -changes in vision -fast, irregular heartbeat -high or low blood pressure -mouth sores -pain, tingling, numbness in the hands or feet -signs of decreased platelets or bleeding - bruising, pinpoint red spots on the skin, black, tarry stools, blood in the urine -signs of decreased red blood cells - unusually weak or tired, feeling faint or lightheaded, falls -signs of infection - fever or chills, cough, sore throat, pain or difficulty passing urine -signs and symptoms of liver injury like dark yellow or brown urine; general ill feeling or flu-like symptoms; light-colored stools; loss of appetite; nausea; right upper belly pain; unusually weak or tired; yellowing of the eyes or skin -swelling of the ankles, feet, hands -unusually slow heartbeat Side effects that usually do not require medical attention (report to your doctor or health care professional if they continue or are bothersome): -diarrhea -hair loss -loss of appetite -muscle or joint pain -nausea, vomiting -pain, redness, or irritation at site where injected -tiredness This list may not describe all possible side effects. Call your doctor for medical advice about side effects. You may report side effects   to FDA at 1-800-FDA-1088. Where should I keep my medicine? This drug is given in a hospital or clinic and will not be stored at home. NOTE: This sheet is a summary. It may not cover all possible information. If you have questions  about this medicine, talk to your doctor, pharmacist, or health care provider.  2019 Elsevier/Gold Standard (2016-08-30 13:14:55)  Carboplatin injection What is this medicine? CARBOPLATIN (KAR boe pla tin) is a chemotherapy drug. It targets fast dividing cells, like cancer cells, and causes these cells to die. This medicine is used to treat ovarian cancer and many other cancers. This medicine may be used for other purposes; ask your health care provider or pharmacist if you have questions. COMMON BRAND NAME(S): Paraplatin What should I tell my health care provider before I take this medicine? They need to know if you have any of these conditions: -blood disorders -hearing problems -kidney disease -recent or ongoing radiation therapy -an unusual or allergic reaction to carboplatin, cisplatin, other chemotherapy, other medicines, foods, dyes, or preservatives -pregnant or trying to get pregnant -breast-feeding How should I use this medicine? This drug is usually given as an infusion into a vein. It is administered in a hospital or clinic by a specially trained health care professional. Talk to your pediatrician regarding the use of this medicine in children. Special care may be needed. Overdosage: If you think you have taken too much of this medicine contact a poison control center or emergency room at once. NOTE: This medicine is only for you. Do not share this medicine with others. What if I miss a dose? It is important not to miss a dose. Call your doctor or health care professional if you are unable to keep an appointment. What may interact with this medicine? -medicines for seizures -medicines to increase blood counts like filgrastim, pegfilgrastim, sargramostim -some antibiotics like amikacin, gentamicin, neomycin, streptomycin, tobramycin -vaccines Talk to your doctor or health care professional before taking any of these  medicines: -acetaminophen -aspirin -ibuprofen -ketoprofen -naproxen This list may not describe all possible interactions. Give your health care provider a list of all the medicines, herbs, non-prescription drugs, or dietary supplements you use. Also tell them if you smoke, drink alcohol, or use illegal drugs. Some items may interact with your medicine. What should I watch for while using this medicine? Your condition will be monitored carefully while you are receiving this medicine. You will need important blood work done while you are taking this medicine. This drug may make you feel generally unwell. This is not uncommon, as chemotherapy can affect healthy cells as well as cancer cells. Report any side effects. Continue your course of treatment even though you feel ill unless your doctor tells you to stop. In some cases, you may be given additional medicines to help with side effects. Follow all directions for their use. Call your doctor or health care professional for advice if you get a fever, chills or sore throat, or other symptoms of a cold or flu. Do not treat yourself. This drug decreases your body's ability to fight infections. Try to avoid being around people who are sick. This medicine may increase your risk to bruise or bleed. Call your doctor or health care professional if you notice any unusual bleeding. Be careful brushing and flossing your teeth or using a toothpick because you may get an infection or bleed more easily. If you have any dental work done, tell your dentist you are receiving this medicine. Avoid taking products   that contain aspirin, acetaminophen, ibuprofen, naproxen, or ketoprofen unless instructed by your doctor. These medicines may hide a fever. Do not become pregnant while taking this medicine. Women should inform their doctor if they wish to become pregnant or think they might be pregnant. There is a potential for serious side effects to an unborn child. Talk to  your health care professional or pharmacist for more information. Do not breast-feed an infant while taking this medicine. What side effects may I notice from receiving this medicine? Side effects that you should report to your doctor or health care professional as soon as possible: -allergic reactions like skin rash, itching or hives, swelling of the face, lips, or tongue -signs of infection - fever or chills, cough, sore throat, pain or difficulty passing urine -signs of decreased platelets or bleeding - bruising, pinpoint red spots on the skin, black, tarry stools, nosebleeds -signs of decreased red blood cells - unusually weak or tired, fainting spells, lightheadedness -breathing problems -changes in hearing -changes in vision -chest pain -high blood pressure -low blood counts - This drug may decrease the number of white blood cells, red blood cells and platelets. You may be at increased risk for infections and bleeding. -nausea and vomiting -pain, swelling, redness or irritation at the injection site -pain, tingling, numbness in the hands or feet -problems with balance, talking, walking -trouble passing urine or change in the amount of urine Side effects that usually do not require medical attention (report to your doctor or health care professional if they continue or are bothersome): -hair loss -loss of appetite -metallic taste in the mouth or changes in taste This list may not describe all possible side effects. Call your doctor for medical advice about side effects. You may report side effects to FDA at 1-800-FDA-1088. Where should I keep my medicine? This drug is given in a hospital or clinic and will not be stored at home. NOTE: This sheet is a summary. It may not cover all possible information. If you have questions about this medicine, talk to your doctor, pharmacist, or health care provider.  2019 Elsevier/Gold Standard (2007-04-03 14:38:05)

## 2018-03-02 NOTE — Assessment & Plan Note (Signed)
She is currently in normal sinus rhythm She might need cardiology clearance before proceed with surgery due to current use of anticoagulation therapy

## 2018-03-02 NOTE — Telephone Encounter (Signed)
Gave avs and calendar ° °

## 2018-03-03 LAB — CA 125: Cancer Antigen (CA) 125: 174 U/mL — ABNORMAL HIGH (ref 0.0–38.1)

## 2018-03-06 ENCOUNTER — Telehealth: Payer: Self-pay | Admitting: Family Medicine

## 2018-03-06 NOTE — Telephone Encounter (Unsigned)
Copied from [place] [id number]. Topic: Quick Communication - Home Health Verbal Orders >> Mar 06, 2018  9:41 AM Carolyn Stare wrote: Caller/Agency Simona Huh with [facility] Number [phone]  [facility] report pt is refusing PT   Frequency: ***

## 2018-03-06 NOTE — Telephone Encounter (Signed)
FYI sent to Dr. Jordan 

## 2018-03-06 NOTE — Telephone Encounter (Unsigned)
Copied from [place] [id number]. Topic: General - Other >> Mar 06, 2018 10:01 AM Carolyn Stare wrote:  Simona Huh with [facility] call to say pt refuse PT

## 2018-03-08 ENCOUNTER — Ambulatory Visit: Payer: PPO | Admitting: Cardiology

## 2018-03-08 ENCOUNTER — Encounter: Payer: Self-pay | Admitting: Cardiology

## 2018-03-08 ENCOUNTER — Telehealth: Payer: Self-pay | Admitting: Oncology

## 2018-03-08 VITALS — BP 140/74 | HR 60 | Ht 62.0 in | Wt 132.0 lb

## 2018-03-08 DIAGNOSIS — Z01810 Encounter for preprocedural cardiovascular examination: Secondary | ICD-10-CM | POA: Insufficient documentation

## 2018-03-08 DIAGNOSIS — R079 Chest pain, unspecified: Secondary | ICD-10-CM | POA: Diagnosis not present

## 2018-03-08 DIAGNOSIS — Z01818 Encounter for other preprocedural examination: Secondary | ICD-10-CM

## 2018-03-08 DIAGNOSIS — I480 Paroxysmal atrial fibrillation: Secondary | ICD-10-CM | POA: Diagnosis not present

## 2018-03-08 NOTE — Progress Notes (Signed)
03/08/2018 [place]   [birth year]  [id number]  Primary Physician [place], Elisabel G, MD Primary Cardiologist: Dr Claiborne Billings  HPI:  Patient is a pleasant 73 year old female followed by Dr. Claiborne Billings with a history of PAF, sleep apnea, and dyslipidemia.  She had a remote low risk Myoview in 2013.  She has sleep apnea and is not on CPAP.  He saw her last in September 2019.  In December 2019 she was diagnosed with ovarian cancer with metastasis.  She started on chemotherapy with plans for eventual surgery.  In January 2020 she was admitted with a partial small bowel obstruction.  She was in the hospital for 3 weeks.  During that hospitalization she had recurrent PAF with RVR.  Cardiology was not consulted.  Diltiazem was added and her Toprol was increased.  She was seen in the office 02/19/2018  for follow-up.  From a cardiac standpoint she is done pretty well, she was back in sinus rhythm.  She is in the office today for pre op clearance.  Since we saw her last she had dental extraction without incident (she was quite concerned about this).    When I was talking with her she admitted that last week she had a couple spells of "indigestion" that lasted on a few minutes.  No other complaint, no DOE, diaphoresis, or tachycardia.    Current Outpatient Medications  Medication Sig Dispense Refill  . ALPRAZolam (XANAX) 0.5 MG tablet TAKE ONE TABLET BY MOUTH ONCE DAILY AS NEEDED (Patient taking differently: Take 0.25-0.5 mg by mouth daily as needed for anxiety. TAKE ONE TABLET BY MOUTH ONCE DAILY AS NEEDED) 90 tablet 3  . Calcium Carbonate (CALTRATE 600) 1500 MG TABS Take by mouth daily.     . Carboxymethylcellulose Sodium (EYE DROPS OP) Apply 1 drop to eye daily as needed. Thera Tears    . cholecalciferol (VITAMIN D) 1000 UNITS tablet Take 1,000 Units by mouth daily.     Marland Kitchen dexamethasone (DECADRON) 4 MG tablet Take 2 tabs at the night before and 2 tabs the morning of chemotherapy, every 3 weeks, by mouth 20 tablet  0  . fluticasone (FLONASE) 50 MCG/ACT nasal spray Use 2 sprays in each nostril daily 48 g 2  . ibuprofen (ADVIL,MOTRIN) 200 MG tablet Take 600 mg by mouth every 6 (six) hours as needed for moderate pain.     Marland Kitchen lactose free nutrition (BOOST PLUS) LIQD Take 237 mLs by mouth daily. 10 Can 0  . levothyroxine (SYNTHROID, LEVOTHROID) 75 MCG tablet Take 1 tablet (75 mcg total) by mouth daily. 90 tablet 3  . lidocaine-prilocaine (EMLA) cream Apply 1 application topically as needed. 30 g 6  . Menthol-Methyl Salicylate (MUSCLE RUB) 10-15 % CREA Apply 1 application topically as needed for muscle pain.    . metoprolol succinate (TOPROL-XL) 50 MG 24 hr tablet Take 1 tablet (50 mg total) by mouth daily. Take with or immediately following a meal. 90 tablet 3  . Multiple Vitamins-Minerals (MULTIVITAMIN WITH MINERALS) tablet Take 1 tablet by mouth daily.      . Omega-3 Fatty Acids (FISH OIL) 1000 MG CAPS Take 2 capsules by mouth 2 (two) times daily.    Marland Kitchen omeprazole (PRILOSEC) 40 MG capsule Take 40 mg by mouth daily.    . ondansetron (ZOFRAN) 8 MG tablet Take 1 tablet (8 mg total) by mouth every 8 (eight) hours as needed for nausea. 30 tablet 3  . oxyCODONE (OXY IR/ROXICODONE) 5 MG immediate release tablet Take 1  tablet (5 mg total) by mouth every 4 (four) hours as needed for severe pain. 30 tablet 0  . polyethylene glycol (MIRALAX / GLYCOLAX) packet Take 17 g by mouth daily. (Patient taking differently: Take 17 g by mouth daily as needed. ) 14 each 0  . prochlorperazine (COMPAZINE) 10 MG tablet Take 1 tablet (10 mg total) by mouth every 6 (six) hours as needed for nausea or vomiting. 30 tablet 0  . rivaroxaban (XARELTO) 20 MG TABS tablet Take 1 tablet (20 mg total) by mouth daily with supper. 14 tablet 0  . simvastatin (ZOCOR) 20 MG tablet Take 1 tablet (20 mg total) by mouth at bedtime. 90 tablet 3  . vitamin C (ASCORBIC ACID) 500 MG tablet Take 500 mg by mouth 2 (two) times daily.      No current  facility-administered medications for this visit.     Allergies  Allergen Reactions  . Tramadol Hcl     REACTION: jittery  . Tramadol Other (See Comments)    Past Medical History:  Diagnosis Date  . Allergy    SEASONAL  . Cataract    BILATERAL  . Family history of lung cancer   . Family history of prostate cancer   . Family history of prostate cancer   . Family history of uterine cancer   . Fibromyalgia   . GERD (gastroesophageal reflux disease)   . H/O echocardiogram 04/28/08   EF>55% trace mitral regurgitation, No significant valvular pathology  . Hemorrhoids    internal  . History of stress test 02/08/2011   Normal Myocardial perfusion study, this is a low risk scan, No prior study available for comparison  . Hyperlipidemia   . MVP (mitral valve prolapse)    mild and MR  . OA (osteoarthritis)   . Osteoporosis   . Paroxysmal A-fib ([place])   . Sleep apnea    does not use prescribed CPAP  . Thyroid disease    HYPO  . Varicosities of leg   . Vitamin D deficiency     Social History   Socioeconomic History  . Marital status: Married    Spouse name: Not on file  . Number of children: Not on file  . Years of education: Not on file  . Highest education level: Not on file  Occupational History  . Not on file  Social Needs  . Financial resource strain: Not on file  . Food insecurity:    Worry: Not on file    Inability: Not on file  . Transportation needs:    Medical: Not on file    Non-medical: Not on file  Tobacco Use  . Smoking status: Light Tobacco Smoker    Packs/day: 0.50    Years: 10.00    Pack years: 5.00    Types: Cigars  . Smokeless tobacco: Never Used  . Tobacco comment: peach flavor   Substance and Sexual Activity  . Alcohol use: No    Alcohol/week: 0.0 standard drinks  . Drug use: No  . Sexual activity: Yes    Comment: 1st intercourse 17yo-1 partner  Lifestyle  . Physical activity:    Days per week: Not on file    Minutes per session: Not  on file  . Stress: Not on file  Relationships  . Social connections:    Talks on phone: Not on file    Gets together: Not on file    Attends religious service: Not on file    Active member of club or organization:  Not on file    Attends meetings of clubs or organizations: Not on file    Relationship status: Not on file  . Intimate partner violence:    Fear of current or ex partner: Not on file    Emotionally abused: Not on file    Physically abused: Not on file    Forced sexual activity: Not on file  Other Topics Concern  . Not on file  Social History Narrative  . Not on file     Family History  Problem Relation Age of Onset  . Diabetes Daughter   . Diabetes Mother   . Hypertension Mother   . Heart disease Mother   . Stroke Maternal Grandmother   . Lung cancer Niece   . Cancer Paternal Aunt        type of cancer unk  . Prostate cancer Paternal Uncle   . Uterine cancer Cousin        dx under 58  . Colon cancer Neg Hx      Review of Systems: General: negative for chills, fever, night sweats or weight changes.  Cardiovascular: negative for dyspnea on exertion, edema, orthopnea, palpitations, paroxysmal nocturnal dyspnea or shortness of breath Dermatological: negative for rash Respiratory: negative for cough or wheezing Urologic: negative for hematuria Abdominal: negative for nausea, vomiting, diarrhea, bright red blood per rectum, melena, or hematemesis Neurologic: negative for visual changes, syncope, or dizziness All other systems reviewed and are otherwise negative except as noted above.    Blood pressure 140/74, pulse 60, height 5\' 2"  (1.575 m), weight 132 lb (59.9 kg), SpO2 98 %.  General appearance: alert, cooperative and no distress Neck: no carotid bruit and no JVD Lungs: clear to auscultation bilaterally Heart: regular rate and rhythm Extremities: no edema Skin: warm and dry Neurologic: Grossly normal  EKG NSR, HR 59  ASSESSMENT AND PLAN:   Pre op  clearance She did have some chest pain- I think she needs a Lexiscan pre op.   PAF (paroxysmal atrial fibrillation) (HCC) Recurrent PAF when hospitalized Jan 2020- now back in NSR- SB- will drop Diltiazem 30 mg Q6, connie Toprol 50 mg  Chronic anticoagulation CHADS VASC=3-on Xarelto  Ovarian cancer ([place]) suspected Diagnosed Dec 2019- ovarian CA with mets- Rx'd with chemotherapy  Partial small bowel obstruction ([place]) Admitted 01/13/2018- 02/02/2018  OSA (obstructive sleep apnea) Followed by Dr Claiborne Billings- not on C-pap  Essential hypertension, benign Controlled   PLAN  Clearance pending Lexiscan result. I spoke with pharmacy- hold Xarelto 48 hours pre op.   Kerin Ransom PA-C 03/08/2018 4:32 PM

## 2018-03-08 NOTE — Patient Instructions (Addendum)
Medication Instructions:  Your physician recommends that you continue on your current medications as directed. Please refer to the Current Medication list given to you today. If you need a refill on your cardiac medications before your next appointment, please call your pharmacy.   Lab work: None  If you have labs (blood work) drawn today and your tests are completely normal, you will receive your results only by: Marland Kitchen MyChart Message (if you have MyChart) OR . A paper copy in the mail If you have any lab test that is abnormal or we need to change your treatment, we will call you to review the results.  Testing/Procedures: Your physician has requested that you have a lexiscan myoview. For further information please visit [email or web address]. Please follow instruction sheet, as given.   Follow-Up: At [facility], you and your health needs are our priority.  As part of our continuing mission to provide you with exceptional heart care, we have created designated Provider Care Teams.  These Care Teams include your primary Cardiologist (physician) and Advanced Practice Providers (APPs -  Physician Assistants and Nurse Practitioners) who all work together to provide you with the care you need, when you need it. . FOLLOW UP WITH DR Claiborne Billings AS SCHEDULED  Any Other Special Instructions Will Be Listed Below (If Applicable).

## 2018-03-08 NOTE — Assessment & Plan Note (Signed)
Pt seen today for pre op clearance prior to GI surgery

## 2018-03-08 NOTE — Assessment & Plan Note (Signed)
Holding NSR 

## 2018-03-08 NOTE — Telephone Encounter (Signed)
Called [name] and advised her that she will need cardiac clearance for surgery tentatively scheduled for 03/26/17 and that an inbasket note has been sent to cardiology.  She has an apt today with cardiology and will discuss this.  She also said she is having body aches and is hurting all over. She said she felt good for 2 days after chemo but then the aching started. She has been taking 1 oxycodone per day and wants to know if she should take them every 4 hours. Advised her that the prescription says that she can take them every 4 hours.  She verbalized agreement.

## 2018-03-08 NOTE — Assessment & Plan Note (Signed)
Pt complained of "indigestion" type chest pain last week.

## 2018-03-08 NOTE — Addendum Note (Signed)
Addended by: Ulice Brilliant T on: 03/08/2018 04:50 PM   Modules accepted: Orders

## 2018-03-09 ENCOUNTER — Telehealth: Payer: Self-pay | Admitting: Oncology

## 2018-03-09 NOTE — Telephone Encounter (Signed)
Faxed surgical clearance form to [place] at [phone].

## 2018-03-10 ENCOUNTER — Encounter: Payer: Self-pay | Admitting: Hematology and Oncology

## 2018-03-12 ENCOUNTER — Other Ambulatory Visit: Payer: Self-pay

## 2018-03-12 NOTE — Patient Outreach (Signed)
[name]) Care Management  03/12/2018  [place] 02/12/1945 [phone]  Unsuccessful outreach attempt. Left HIPAA compliant voice message requesting a return call.   PLAN Will follow up within 3-4 business days.   [place] [phone]

## 2018-03-14 ENCOUNTER — Encounter: Payer: Self-pay | Admitting: Family Medicine

## 2018-03-14 ENCOUNTER — Ambulatory Visit (INDEPENDENT_AMBULATORY_CARE_PROVIDER_SITE_OTHER): Payer: PPO | Admitting: Family Medicine

## 2018-03-14 ENCOUNTER — Telehealth: Payer: Self-pay | Admitting: Family Medicine

## 2018-03-14 VITALS — BP 126/82 | HR 60 | Temp 98.2°F | Resp 12 | Ht 62.0 in | Wt 137.2 lb

## 2018-03-14 DIAGNOSIS — F419 Anxiety disorder, unspecified: Secondary | ICD-10-CM | POA: Diagnosis not present

## 2018-03-14 DIAGNOSIS — R1084 Generalized abdominal pain: Secondary | ICD-10-CM | POA: Diagnosis not present

## 2018-03-14 DIAGNOSIS — E785 Hyperlipidemia, unspecified: Secondary | ICD-10-CM | POA: Diagnosis not present

## 2018-03-14 DIAGNOSIS — K625 Hemorrhage of anus and rectum: Secondary | ICD-10-CM

## 2018-03-14 DIAGNOSIS — K649 Unspecified hemorrhoids: Secondary | ICD-10-CM

## 2018-03-14 DIAGNOSIS — K219 Gastro-esophageal reflux disease without esophagitis: Secondary | ICD-10-CM

## 2018-03-14 MED ORDER — PROCHLORPERAZINE MALEATE 10 MG PO TABS: 10.0000 mg | ORAL_TABLET | Freq: Four times a day (QID) | ORAL | 1 refills | Status: DC | PRN

## 2018-03-14 MED ORDER — HYDROCORTISONE ACETATE 25 MG RE SUPP: 25.0000 mg | Freq: Two times a day (BID) | RECTAL | 1 refills | Status: DC | PRN

## 2018-03-14 NOTE — Telephone Encounter (Signed)
Spoke with patient and gave instructions to get suppository OTC. Patient verbalized understanding.

## 2018-03-14 NOTE — Patient Instructions (Addendum)
A few things to remember from today's visit:   Rectal bleed - Plan: hydrocortisone (ANUSOL-HC) 25 MG suppository  Dyslipidemia  Generalized abdominal pain  Hemorrhoids, unspecified hemorrhoid type - Plan: hydrocortisone (ANUSOL-HC) 25 MG suppository   Abdominal Bloating When you have abdominal bloating, your abdomen may feel full, tight, or painful. It may also look bigger than normal or swollen (distended). Common causes of abdominal bloating include:  Swallowing air.  Constipation.  Problems digesting food.  Eating too much.  Irritable bowel syndrome. This is a condition that affects the large intestine.  Lactose intolerance. This is an inability to digest lactose, a natural sugar in dairy products.  Celiac disease. This is a condition that affects the ability to digest gluten, a protein found in some grains.  Gastroparesis. This is a condition that slows down the movement of food in the stomach and small intestine. It is more common in people with diabetes mellitus.  Gastroesophageal reflux disease (GERD). This is a digestive condition that makes stomach acid flow back into the esophagus.  Urinary retention. This means that the body is holding onto urine, and the bladder cannot be emptied all the way. Follow these instructions at home: Eating and drinking  Avoid eating too much.  Try not to swallow air while talking or eating.  Avoid eating while lying down.  Avoid these foods and drinks: ? Foods that cause gas, such as broccoli, cabbage, cauliflower, and baked beans. ? Carbonated drinks. ? Hard candy. ? Chewing gum. Medicines  Take over-the-counter and prescription medicines only as told by your health care provider.  Take probiotic medicines. These medicines contain live bacteria or yeasts that can help digestion.  Take coated peppermint oil capsules. Activity  Try to exercise regularly. Exercise may help to relieve bloating that is caused by gas and  relieve constipation. General instructions  Keep all follow-up visits as told by your health care provider. This is important. Contact a health care provider if:  You have nausea and vomiting.  You have diarrhea.  You have abdominal pain.  You have unusual weight loss or weight gain.  You have severe pain, and medicines do not help. Get help right away if:  You have severe chest pain.  You have trouble breathing.  You have shortness of breath.  You have trouble urinating.  You have darker urine than normal.  You have blood in your stools or have dark, tarry stools. Summary  Abdominal bloating means that the abdomen is swollen.  Common causes of abdominal bloating are swallowing air, constipation, and problems digesting food.  Avoid eating too much and avoid swallowing air.  Avoid foods that cause gas, carbonated drinks, hard candy, and chewing gum. This information is not intended to replace advice given to you by your health care provider. Make sure you discuss any questions you have with your health care provider. Document Released: 01/29/2016 Document Revised: 01/29/2016 Document Reviewed: 01/29/2016 Elsevier Interactive Patient Education  2019 [employer].  Please be sure medication list is accurate. If a new problem present, please set up appointment sooner than planned today.

## 2018-03-14 NOTE — Assessment & Plan Note (Signed)
Symptoms improved when starting taking omeprazole. No changes in current management. Continue GERD precautions.

## 2018-03-14 NOTE — Telephone Encounter (Signed)
Copied from [place] [id number]. Topic: Quick Communication - See Telephone Encounter >> Mar 14, 2018  2:33 PM Ivar Drape wrote: CRM for notification. See Telephone encounter for: 03/14/18.  Patient stated that her pharmacy said her insurance does not cover the hydrocortisone (ANUSOL-HC) 25 MG suppository prescription.  The pharmacy said she would need to get that from the shelf.  She wants to know what to get from the shelf.  Please advise.

## 2018-03-14 NOTE — Progress Notes (Signed)
ACUTE VISIT   HPI:  Chief Complaint  Patient presents with  . Hemorrhoids bleeding    causing lower back pain    [name] is a 73 y.o. female, who is here today complaining of episode of rectal bleed last night with defecation. Negative for dyschezia. Blood noted on tissue and in toilet. History of internal hemorrhoids.  She denies having constipation or diarrhea. She has about 2-3 bowel movements daily, last one was last night. Negative for melena.  Anemia due to antineoplastic chemotherapy. She is also on Xarelto.  Lab Results  Component Value Date   WBC 5.8 03/02/2018   HGB 10.3 (L) 03/02/2018   HCT 32.6 (L) 03/02/2018   MCV 91.8 03/02/2018   PLT 200 03/02/2018    She states that she had similar problem while she was hospitalized, Proctosol cream was recommended.  She discontinued Proctosol when symptoms resolved.  Today she is also complaining of "gas" pain, diffuse, constant. Problem is alleviated by passing gas or bowel movements. She has not identified exacerbating factors. Maalox usually helps.  She would like a refill for prochlorperazine 10 mg, which was prescribed by her oncologist to help with nausea.  She feels like medication also helps with generalized aching pain she has after chemotherapy (ovarian cancer).  She has tolerated medication well, no side effects.  A few weeks ago she started with upper burning abdominal pain.She has had before, omeprazole was recommended(09/2017) but she did not start it because symptoms improved with dietary changes. She started omeprazole 40 mg a few days ago and it has helped with pain, which is now resolved. She has not noted heartburn.  Anxiety: Needs a refill on Xanax,which she has taken for years. No side effects reported. It helps with acute anxiety. Denies depression or suicidal thoughts.   Review of Systems  Constitutional: Positive for fatigue. Negative for appetite change and fever.    HENT: Negative for mouth sores, nosebleeds and trouble swallowing.   Respiratory: Negative for shortness of breath and wheezing.   Cardiovascular: Negative for chest pain.  Gastrointestinal: Positive for abdominal pain, blood in stool and nausea. Negative for abdominal distention, constipation and vomiting.  Genitourinary: Negative for dysuria, hematuria, vaginal bleeding and vaginal discharge.  Musculoskeletal: Positive for back pain. Negative for gait problem.  Skin: Negative for rash.  Neurological: Negative for syncope and weakness.  Psychiatric/Behavioral: Negative for confusion. The patient is nervous/anxious.     Current Outpatient Medications on File Prior to Visit  Medication Sig Dispense Refill  . ALPRAZolam (XANAX) 0.5 MG tablet TAKE ONE TABLET BY MOUTH ONCE DAILY AS NEEDED (Patient taking differently: Take 0.25-0.5 mg by mouth daily as needed for anxiety. TAKE ONE TABLET BY MOUTH ONCE DAILY AS NEEDED) 90 tablet 3  . Calcium Carbonate (CALTRATE 600) 1500 MG TABS Take by mouth daily.     . Carboxymethylcellulose Sodium (EYE DROPS OP) Apply 1 drop to eye daily as needed. Thera Tears    . cholecalciferol (VITAMIN D) 1000 UNITS tablet Take 1,000 Units by mouth daily.     Marland Kitchen dexamethasone (DECADRON) 4 MG tablet Take 2 tabs at the night before and 2 tabs the morning of chemotherapy, every 3 weeks, by mouth 20 tablet 0  . fluticasone (FLONASE) 50 MCG/ACT nasal spray Use 2 sprays in each nostril daily 48 g 2  . ibuprofen (ADVIL,MOTRIN) 200 MG tablet Take 600 mg by mouth every 6 (six) hours as needed for moderate pain.     Marland Kitchen  lactose free nutrition (BOOST PLUS) LIQD Take 237 mLs by mouth daily. 10 Can 0  . levothyroxine (SYNTHROID, LEVOTHROID) 75 MCG tablet Take 1 tablet (75 mcg total) by mouth daily. 90 tablet 3  . lidocaine-prilocaine (EMLA) cream Apply 1 application topically as needed. 30 g 6  . Menthol-Methyl Salicylate (MUSCLE RUB) 10-15 % CREA Apply 1 application topically as needed  for muscle pain.    . metoprolol succinate (TOPROL-XL) 50 MG 24 hr tablet Take 1 tablet (50 mg total) by mouth daily. Take with or immediately following a meal. 90 tablet 3  . Multiple Vitamins-Minerals (MULTIVITAMIN WITH MINERALS) tablet Take 1 tablet by mouth daily.      . Omega-3 Fatty Acids (FISH OIL) 1000 MG CAPS Take 2 capsules by mouth 2 (two) times daily.    Marland Kitchen omeprazole (PRILOSEC) 40 MG capsule Take 40 mg by mouth daily.    . ondansetron (ZOFRAN) 8 MG tablet Take 1 tablet (8 mg total) by mouth every 8 (eight) hours as needed for nausea. 30 tablet 3  . oxyCODONE (OXY IR/ROXICODONE) 5 MG immediate release tablet Take 1 tablet (5 mg total) by mouth every 4 (four) hours as needed for severe pain. 30 tablet 0  . polyethylene glycol (MIRALAX / GLYCOLAX) packet Take 17 g by mouth daily. (Patient taking differently: Take 17 g by mouth daily as needed. ) 14 each 0  . rivaroxaban (XARELTO) 20 MG TABS tablet Take 1 tablet (20 mg total) by mouth daily with supper. 14 tablet 0  . simvastatin (ZOCOR) 20 MG tablet Take 1 tablet (20 mg total) by mouth at bedtime. 90 tablet 3  . vitamin C (ASCORBIC ACID) 500 MG tablet Take 500 mg by mouth 2 (two) times daily.      No current facility-administered medications on file prior to visit.      Past Medical History:  Diagnosis Date  . Allergy    SEASONAL  . Cataract    BILATERAL  . Family history of lung cancer   . Family history of prostate cancer   . Family history of prostate cancer   . Family history of uterine cancer   . Fibromyalgia   . GERD (gastroesophageal reflux disease)   . H/O echocardiogram 04/28/08   EF>55% trace mitral regurgitation, No significant valvular pathology  . Hemorrhoids    internal  . History of stress test 02/08/2011   Normal Myocardial perfusion study, this is a low risk scan, No prior study available for comparison  . Hyperlipidemia   . MVP (mitral valve prolapse)    mild and MR  . OA (osteoarthritis)   .  Osteoporosis   . Paroxysmal A-fib ([place])   . Sleep apnea    does not use prescribed CPAP  . Thyroid disease    HYPO  . Varicosities of leg   . Vitamin D deficiency    Allergies  Allergen Reactions  . Tramadol Hcl     REACTION: jittery  . Tramadol Other (See Comments)    Social History   Socioeconomic History  . Marital status: Married    Spouse name: Not on file  . Number of children: Not on file  . Years of education: Not on file  . Highest education level: Not on file  Occupational History  . Not on file  Social Needs  . Financial resource strain: Not on file  . Food insecurity:    Worry: Not on file    Inability: Not on file  . Transportation needs:  Medical: Not on file    Non-medical: Not on file  Tobacco Use  . Smoking status: Light Tobacco Smoker    Packs/day: 0.50    Years: 10.00    Pack years: 5.00    Types: Cigars  . Smokeless tobacco: Never Used  . Tobacco comment: peach flavor   Substance and Sexual Activity  . Alcohol use: No    Alcohol/week: 0.0 standard drinks  . Drug use: No  . Sexual activity: Yes    Comment: 1st intercourse 17yo-1 partner  Lifestyle  . Physical activity:    Days per week: Not on file    Minutes per session: Not on file  . Stress: Not on file  Relationships  . Social connections:    Talks on phone: Not on file    Gets together: Not on file    Attends religious service: Not on file    Active member of club or organization: Not on file    Attends meetings of clubs or organizations: Not on file    Relationship status: Not on file  Other Topics Concern  . Not on file  Social History Narrative  . Not on file    Vitals:   03/14/18 1017  BP: 126/82  Pulse: 60  Resp: 12  Temp: 98.2 F (36.8 C)  SpO2: 98%   Body mass index is 25.1 kg/m.   Physical Exam  Nursing note and vitals reviewed. Constitutional: She is oriented to person, place, and time. She appears well-developed and well-nourished. She does not  appear ill. No distress.  HENT:  Head: Normocephalic and atraumatic.  Mouth/Throat: Oropharynx is clear and moist and mucous membranes are normal.  Eyes: Conjunctivae are normal.  Cardiovascular: Normal rate and regular rhythm.  No murmur heard. Respiratory: Effort normal and breath sounds normal. No respiratory distress.  GI: Soft. Bowel sounds are normal. She exhibits no distension and no mass. There is no abdominal tenderness.  Genitourinary: Rectum:     Tenderness (mild) present.     No rectal mass, anal fissure, external hemorrhoid or abnormal anal tone.   Musculoskeletal:        General: No edema.  Neurological: She is alert and oriented to person, place, and time. She has normal strength.  Skin: Skin is warm. No rash noted. No erythema.  Psychiatric: Her mood appears anxious.  Well groomed, good eye contact.   ASSESSMENT AND PLAN:  Ms. Oktober was seen today for hemorrhoids bleeding.  Diagnoses and all orders for this visit:  Rectal bleed Bleeding resolved. Colonoscopy in 10/2012. For now no changes in Xarelto.  Avoid straining or prolonged sitting on toilet. Instructed about warning signs. If problem becomes recurrent we will need GI evaluation.  -     hydrocortisone (ANUSOL-HC) 25 MG suppository; Place 1 suppository (25 mg total) rectally 2 (two) times daily as needed for hemorrhoids or anal itching.  Hemorrhoids, unspecified hemorrhoid type ? Internal hemorrhoids. Avoid constipation and/or diarrhea.  Adequate fiber and water intake. Recommend Anusol suppositories.  -     hydrocortisone (ANUSOL-HC) 25 MG suppository; Place 1 suppository (25 mg total) rectally 2 (two) times daily as needed for hemorrhoids or anal itching.  Dyslipidemia She does not want to stop taking Simvastatin for a few weeks,it may contribute to GI symptoms.  Anxiety Reported as stable. We discussed some side effects of Xanax. No changes in current management.  GERD Symptoms improved  when starting taking omeprazole. No changes in current management. Continue GERD precautions.  Abdominal pain  Attributed to "gas", recommend continuing Maalox daily prn.  Problem could be aggravated/caused by some of her medications, including antineoplastic medication and oral steroid among some.  I recommended holding on simvastatin for a few weeks and to monitor for symptom changes, but she prefers to continue taking it.   Adequate hydration and fiber intake. Clearly instructed about warning signs.    Other orders -     prochlorperazine (COMPAZINE) 10 MG tablet; Take 1 tablet (10 mg total) by mouth every 6 (six) hours as needed for nausea or vomiting.    No follow-ups on file.   30 min face to face OV. > 50% was dedicated to discussion of Dx, prognosis, treatment options, and some side effects of medications. Future Prochlorperazine refills from oncologist's office.    Steven G. Martinique, MD  [facility]. [place] office.

## 2018-03-14 NOTE — Assessment & Plan Note (Signed)
Reported as stable. We discussed some side effects of Xanax. No changes in current management.

## 2018-03-14 NOTE — Assessment & Plan Note (Signed)
She does not want to stop taking Simvastatin for a few weeks,it may contribute to GI symptoms.

## 2018-03-14 NOTE — Assessment & Plan Note (Addendum)
Attributed to "gas", recommend continuing Maalox daily prn.  Problem could be aggravated/caused by some of her medications, including antineoplastic medication and oral steroid among some.  I recommended holding on simvastatin for a few weeks and to monitor for symptom changes, but she prefers to continue taking it.   Adequate hydration and fiber intake. Clearly instructed about warning signs.

## 2018-03-15 ENCOUNTER — Telehealth (HOSPITAL_COMMUNITY): Payer: Self-pay

## 2018-03-15 ENCOUNTER — Ambulatory Visit (HOSPITAL_COMMUNITY)
Admission: RE | Admit: 2018-03-15 | Discharge: 2018-03-15 | Disposition: A | Discharge status: Home / Self Care | Payer: PPO | Source: Ambulatory Visit | Attending: Hematology and Oncology | Admitting: Hematology and Oncology

## 2018-03-15 ENCOUNTER — Other Ambulatory Visit: Payer: Self-pay

## 2018-03-15 ENCOUNTER — Ambulatory Visit: Payer: PPO | Admitting: Gynecologic Oncology

## 2018-03-15 DIAGNOSIS — C569 Malignant neoplasm of unspecified ovary: Secondary | ICD-10-CM | POA: Insufficient documentation

## 2018-03-15 DIAGNOSIS — N839 Noninflammatory disorder of ovary, fallopian tube and broad ligament, unspecified: Secondary | ICD-10-CM | POA: Diagnosis not present

## 2018-03-15 DIAGNOSIS — N1330 Unspecified hydronephrosis: Secondary | ICD-10-CM | POA: Diagnosis not present

## 2018-03-15 MED ORDER — IOPAMIDOL (ISOVUE-300) INJECTION 61%
100.0000 mL | Freq: Once | INTRAVENOUS | Status: AC | PRN
  Administered 2018-03-15: 100 mL via INTRAVENOUS

## 2018-03-15 MED ORDER — SODIUM CHLORIDE (PF) 0.9 % IJ SOLN
INTRAMUSCULAR | Status: AC
  Filled 2018-03-15: qty 50

## 2018-03-15 NOTE — Telephone Encounter (Signed)
Encounter complete. 

## 2018-03-15 NOTE — Patient Outreach (Signed)
[facility]) Care Management  03/15/2018  [place] [birth year] [phone]   Unsuccessful outreach attempt. Left HIPAA compliant voice message requesting a return call.   PLAN Will follow up within 3-4 business days.   [place] [phone]

## 2018-03-16 ENCOUNTER — Telehealth: Payer: Self-pay | Admitting: *Deleted

## 2018-03-16 ENCOUNTER — Other Ambulatory Visit: Payer: Self-pay

## 2018-03-16 ENCOUNTER — Encounter: Payer: Self-pay | Admitting: Oncology

## 2018-03-16 ENCOUNTER — Other Ambulatory Visit: Payer: Self-pay | Admitting: Hematology and Oncology

## 2018-03-16 ENCOUNTER — Encounter: Payer: Self-pay | Admitting: Gynecologic Oncology

## 2018-03-16 ENCOUNTER — Telehealth (HOSPITAL_COMMUNITY): Payer: Self-pay

## 2018-03-16 ENCOUNTER — Inpatient Hospital Stay: Payer: PPO | Attending: Obstetrics | Admitting: Gynecologic Oncology

## 2018-03-16 VITALS — BP 118/69 | HR 69 | Temp 98.2°F | Resp 18 | Ht 62.0 in | Wt 137.0 lb

## 2018-03-16 DIAGNOSIS — E785 Hyperlipidemia, unspecified: Secondary | ICD-10-CM | POA: Diagnosis not present

## 2018-03-16 DIAGNOSIS — K219 Gastro-esophageal reflux disease without esophagitis: Secondary | ICD-10-CM | POA: Insufficient documentation

## 2018-03-16 DIAGNOSIS — M1990 Unspecified osteoarthritis, unspecified site: Secondary | ICD-10-CM | POA: Diagnosis not present

## 2018-03-16 DIAGNOSIS — C786 Secondary malignant neoplasm of retroperitoneum and peritoneum: Secondary | ICD-10-CM

## 2018-03-16 DIAGNOSIS — R32 Unspecified urinary incontinence: Secondary | ICD-10-CM | POA: Diagnosis not present

## 2018-03-16 DIAGNOSIS — Z90710 Acquired absence of both cervix and uterus: Secondary | ICD-10-CM | POA: Diagnosis not present

## 2018-03-16 DIAGNOSIS — I341 Nonrheumatic mitral (valve) prolapse: Secondary | ICD-10-CM | POA: Diagnosis not present

## 2018-03-16 DIAGNOSIS — I480 Paroxysmal atrial fibrillation: Secondary | ICD-10-CM | POA: Insufficient documentation

## 2018-03-16 DIAGNOSIS — R971 Elevated cancer antigen 125 [CA 125]: Secondary | ICD-10-CM | POA: Insufficient documentation

## 2018-03-16 DIAGNOSIS — C569 Malignant neoplasm of unspecified ovary: Secondary | ICD-10-CM | POA: Insufficient documentation

## 2018-03-16 DIAGNOSIS — E559 Vitamin D deficiency, unspecified: Secondary | ICD-10-CM | POA: Insufficient documentation

## 2018-03-16 DIAGNOSIS — F17210 Nicotine dependence, cigarettes, uncomplicated: Secondary | ICD-10-CM | POA: Diagnosis not present

## 2018-03-16 DIAGNOSIS — Z9221 Personal history of antineoplastic chemotherapy: Secondary | ICD-10-CM | POA: Diagnosis not present

## 2018-03-16 DIAGNOSIS — E079 Disorder of thyroid, unspecified: Secondary | ICD-10-CM | POA: Diagnosis not present

## 2018-03-16 DIAGNOSIS — M797 Fibromyalgia: Secondary | ICD-10-CM | POA: Diagnosis not present

## 2018-03-16 MED ORDER — PROCHLORPERAZINE MALEATE 10 MG PO TABS: 10.0000 mg | ORAL_TABLET | Freq: Four times a day (QID) | ORAL | 11 refills | Status: DC | PRN

## 2018-03-16 MED ORDER — SENNOSIDES-DOCUSATE SODIUM 8.6-50 MG PO TABS: 2.0000 | ORAL_TABLET | Freq: Every day | ORAL | 0 refills | Status: DC

## 2018-03-16 NOTE — Telephone Encounter (Signed)
Copied from [place] [id number]. Topic: General - Other >> Mar 16, 2018  9:49 AM Carolyn Stare wrote:  [facility] call to say this is there first fill and since the med is prn they are asking if 30 pills is enough for a 90 day supply if not ow many will be    [place]

## 2018-03-16 NOTE — Telephone Encounter (Signed)
Message sent to Dr. Jordan for review. 

## 2018-03-16 NOTE — Progress Notes (Signed)
Follow-up Note: Gyn-Onc CC:  Chief Complaint  Patient presents with  . Ovarian Cancer    Assessment/Plan:  [name]  is a 73 y.o.  year old with stage IIIC ovarian cancer, s/p 3 cycles carboplatin and paclitaxel (day 1 of cycle 3 on 03/02/18).  [name] is having a good clinical response with improved ability to eat, resolution of GI obstruction and decreasing CA 125. Her CT scan changes are more modest. However, there does not appear to be disease that is not amenable to debulking on scan.  I am recommending interval cytoreduction with ex lap, BSO, omentectomy, radical tumor debulking. I discussed the  Risks of surgery including  bleeding, infection, damage to internal organs (such as bladder,ureters, bowels), blood clot, reoperation and rehospitalization. These are elevated in patients on recent chemotherapy and those with history of anticoagulant use.  I will have Margret stop her xarelto 5 days preop and restart it 1 week postop (with a bridge of postop prophylactic dose lovenox x 1 week). We will teach her regarding injections.  She will be bowel prepped in case of possibility of rectal resection. There is a small chance of this given her pelvic exam findings.  HPI:    [name] is a 73 year old woman who was seen in consultation at the request of Dr Phineas Real in January, 2020 (initially by Dr Precious Haws) for apparent stage IIIC ovarian cancer. At that time she had urinary incontinence about 1 year. This started worsening in Sept 2019. She saw urology and was told she was "backed up" in her words. She was given a medication for the incontinence and associates the use of this medication with the start of constipation. Thinking it was the medication she tried to change her diet to help. When the second week came around she started noticing back pain. By the 3rd week this was fairly severe. After discussing with her PCP she was started on Miralax and colace and mag citrate x 1.  This helped with her BM but she was starting to feel swollen in her abdomen.   On 01/06/18 she felt significant pain and cramps and due to pain went to [facility]. Imaging revealed a pelvic mass and carcinomatosis concerning for ovarian cancer.   01/06/18 CT notes: IMPRESSION: 1. Complex cystic mass at the right adnexa, measuring 5.9 x 4.0 cm, with nodular components, concerning for primary ovarian malignancy. 2. Diffuse nodularity along the omentum at the left side of the abdomen, extending into the mesentery at the left mid abdomen, concerning for peritoneal carcinomatosis. 3. Wall thickening at the distal ileum adjacent to the ovarian mass; bowel loops appear somewhat adherent to the ovarian mass. Bowel infiltration with tumor cannot be excluded. No evidence of bowel obstruction at this time. 4. Small volume ascites within the abdomen and pelvis.  She had some nausea/emesis and significant difficulty eating.   She followed up with Dr.Fontaine, who upon reviewing her case encouraged her to followup here with gynecologic oncology, also out of concern for ovarian cancer. CA125 was drawn with Dr. Phineas Real and is quite elevated at 3004  Due to her apparent small bowel involvement with obstruction, she was treated with neoadjuvant chemotherapy for 3 cycles (starting on 01/18/18).  She returns to the office for follow-up having now undergone 3 cycles of neoadjuvant chemotherapy. She is feeling much better and her bowels have normalized.  She had an initial paracentesis but did not need a repeat. Cytology from that showed 01/15/18  PERITONEAL/ASCITIC FLUID (SPECIMEN 1 OF 1 COLLECTED 01/18/18): MALIGNANT CELLS CONSISTENT WITH METASTATIC ADENOCARCINOMA. SEE COMMENT. COMMENT: THE MALIGNANT CELLS ARE POSITIVE FOR MOC-31, CYTOKERATIN 7, ESTROGEN RECEPTOR, PAX-8, AND WT-1. THEY ARE NEGATIVE FOR CALRETININ, CYTOKERATIN 5/6, AND CYTOKERATIN 20. THE PROFILE IS CONSISTENT WITH A PRIMARY GYNECOLOGIC CARCINOMA.  THERE IS LIKELY SUFFICIENT TUMOR PRESENT, IF ADDITIONAL STUDIES ARE REQUESTED.  Noted some arthralgias but overall feels well.   CA 125 had improved at the time of cycle 3 and was 174.   CT abd/pelvis on March 15, 2018 showed bilateral adnexal masses remained in situ measuring 6.2 on the right previously 4.9 cm and 4.7 cm on the left previously 5.3 cm.  The ascites had resolved.  The omental caking was mildly improved.  There was no suspicious lymphadenopathy.  There was mild bilateral hydronephrosis that was new felt to be secondary to extrinsic compression from the adnexal masses.  No new lesions were seen.   Current Meds:  Outpatient Encounter Medications as of 03/16/2018  Medication Sig  . ALPRAZolam (XANAX) 0.5 MG tablet TAKE ONE TABLET BY MOUTH ONCE DAILY AS NEEDED (Patient taking differently: Take 0.25-0.5 mg by mouth daily as needed for anxiety. TAKE ONE TABLET BY MOUTH ONCE DAILY AS NEEDED)  . Calcium Carbonate (CALTRATE 600) 1500 MG TABS Take by mouth daily.   . Carboxymethylcellulose Sodium (EYE DROPS OP) Apply 1 drop to eye daily as needed. Thera Tears  . cholecalciferol (VITAMIN D) 1000 UNITS tablet Take 1,000 Units by mouth daily.   Marland Kitchen dexamethasone (DECADRON) 4 MG tablet Take 2 tabs at the night before and 2 tabs the morning of chemotherapy, every 3 weeks, by mouth  . fluticasone (FLONASE) 50 MCG/ACT nasal spray Use 2 sprays in each nostril daily  . hydrocortisone (ANUSOL-HC) 25 MG suppository Place 1 suppository (25 mg total) rectally 2 (two) times daily as needed for hemorrhoids or anal itching.  Marland Kitchen ibuprofen (ADVIL,MOTRIN) 200 MG tablet Take 600 mg by mouth every 6 (six) hours as needed for moderate pain.   Marland Kitchen lactose free nutrition (BOOST PLUS) LIQD Take 237 mLs by mouth daily.  Marland Kitchen levothyroxine (SYNTHROID, LEVOTHROID) 75 MCG tablet Take 1 tablet (75 mcg total) by mouth daily.  Marland Kitchen lidocaine-prilocaine (EMLA) cream Apply 1 application topically as needed.  . Menthol-Methyl  Salicylate (MUSCLE RUB) 10-15 % CREA Apply 1 application topically as needed for muscle pain.  . metoprolol succinate (TOPROL-XL) 50 MG 24 hr tablet Take 1 tablet (50 mg total) by mouth daily. Take with or immediately following a meal.  . Multiple Vitamins-Minerals (MULTIVITAMIN WITH MINERALS) tablet Take 1 tablet by mouth daily.    . Omega-3 Fatty Acids (FISH OIL) 1000 MG CAPS Take 2 capsules by mouth 2 (two) times daily.  Marland Kitchen omeprazole (PRILOSEC) 40 MG capsule Take 40 mg by mouth daily.  . ondansetron (ZOFRAN) 8 MG tablet Take 1 tablet (8 mg total) by mouth every 8 (eight) hours as needed for nausea.  Marland Kitchen oxyCODONE (OXY IR/ROXICODONE) 5 MG immediate release tablet Take 1 tablet (5 mg total) by mouth every 4 (four) hours as needed for severe pain.  . polyethylene glycol (MIRALAX / GLYCOLAX) packet Take 17 g by mouth daily. (Patient taking differently: Take 17 g by mouth daily as needed. )  . prochlorperazine (COMPAZINE) 10 MG tablet Take 1 tablet (10 mg total) by mouth every 6 (six) hours as needed for nausea or vomiting.  . rivaroxaban (XARELTO) 20 MG TABS tablet Take 1 tablet (20 mg total) by mouth daily  with supper.  . simvastatin (ZOCOR) 20 MG tablet Take 1 tablet (20 mg total) by mouth at bedtime.  . vitamin C (ASCORBIC ACID) 500 MG tablet Take 500 mg by mouth 2 (two) times daily.    No facility-administered encounter medications on file as of 03/16/2018.     Allergy:  Allergies  Allergen Reactions  . Tramadol Hcl     REACTION: jittery  . Tramadol Other (See Comments)    Social Hx:   Social History   Socioeconomic History  . Marital status: Married    Spouse name: Not on file  . Number of children: Not on file  . Years of education: Not on file  . Highest education level: Not on file  Occupational History  . Not on file  Social Needs  . Financial resource strain: Not on file  . Food insecurity:    Worry: Not on file    Inability: Not on file  . Transportation needs:     Medical: Not on file    Non-medical: Not on file  Tobacco Use  . Smoking status: Light Tobacco Smoker    Packs/day: 0.50    Years: 10.00    Pack years: 5.00    Types: Cigars  . Smokeless tobacco: Never Used  . Tobacco comment: peach flavor   Substance and Sexual Activity  . Alcohol use: No    Alcohol/week: 0.0 standard drinks  . Drug use: No  . Sexual activity: Yes    Comment: 1st intercourse 17yo-1 partner  Lifestyle  . Physical activity:    Days per week: Not on file    Minutes per session: Not on file  . Stress: Not on file  Relationships  . Social connections:    Talks on phone: Not on file    Gets together: Not on file    Attends religious service: Not on file    Active member of club or organization: Not on file    Attends meetings of clubs or organizations: Not on file    Relationship status: Not on file  . Intimate partner violence:    Fear of current or ex partner: Not on file    Emotionally abused: Not on file    Physically abused: Not on file    Forced sexual activity: Not on file  Other Topics Concern  . Not on file  Social History Narrative  . Not on file    Past Surgical Hx:  Past Surgical History:  Procedure Laterality Date  . ABDOMINAL HYSTERECTOMY  1989  . APPENDECTOMY  1989   with hysterectomy  . BREAST EXCISIONAL BIOPSY Left 1995   Benign  . COLONOSCOPY  11-20-2000  . IR IMAGING GUIDED PORT INSERTION  01/17/2018  . NOSE SURGERY  1990    Past Medical Hx:  Past Medical History:  Diagnosis Date  . Allergy    SEASONAL  . Cataract    BILATERAL  . Family history of lung cancer   . Family history of prostate cancer   . Family history of prostate cancer   . Family history of uterine cancer   . Fibromyalgia   . GERD (gastroesophageal reflux disease)   . H/O echocardiogram 04/28/08   EF>55% trace mitral regurgitation, No significant valvular pathology  . Hemorrhoids    internal  . History of stress test 02/08/2011   Normal Myocardial  perfusion study, this is a low risk scan, No prior study available for comparison  . Hyperlipidemia   . MVP (mitral valve  prolapse)    mild and MR  . OA (osteoarthritis)   . Osteoporosis   . Paroxysmal A-fib ([place])   . Sleep apnea    does not use prescribed CPAP  . Thyroid disease    HYPO  . Varicosities of leg   . Vitamin D deficiency     Past Gynecological History:  Abdominal hysterectomy for benign desease. No LMP recorded. Patient has had a hysterectomy.  Family Hx:  Family History  Problem Relation Age of Onset  . Diabetes Daughter   . Diabetes Mother   . Hypertension Mother   . Heart disease Mother   . Stroke Maternal Grandmother   . Lung cancer Niece   . Cancer Paternal Aunt        type of cancer unk  . Prostate cancer Paternal Uncle   . Uterine cancer Cousin        dx under 75  . Colon cancer Neg Hx     Review of Systems:  Constitutional  Feels fatigued   ENT Normal appearing ears and nares bilaterally Skin/Breast  No rash, sores, jaundice, itching, dryness Cardiovascular  No chest pain, shortness of breath, or edema  Pulmonary  No cough or wheeze.  Gastro Intestinal  No nausea, vomitting, or diarrhoea. No bright red blood per rectum, no abdominal pain, change in bowel movement, or constipation. + rectal pain from hemorrhoids Genito Urinary  No frequency, urgency, dysuria, no bleeding Musculo Skeletal  No myalgia, arthralgia, joint swelling or pain  Neurologic  No weakness, numbness, change in gait,  Psychology  No depression, anxiety, insomnia.   Vitals:  Blood pressure 118/69, pulse 69, temperature 98.2 F (36.8 C), temperature source Oral, resp. rate 18, height 5\' 2"  (1.575 m), weight 137 lb (62.1 kg), SpO2 98 %.  Physical Exam: WD in NAD Neck  Supple NROM, without any enlargements.  Lymph Node Survey No cervical supraclavicular or inguinal adenopathy Cardiovascular  Pulse normal rate, regularity and rhythm. S1 and S2 normal.  Lungs   Clear to auscultation bilateraly, without wheezes/crackles/rhonchi. Good air movement.  Skin  No rash/lesions/breakdown  Psychiatry  Alert and oriented to person, place, and time  Abdomen  Normoactive bowel sounds, abdomen soft, non-tender and nonobese without evidence of hernia.  Back No CVA tenderness Genito Urinary  Vulva/vagina: Normal external female genitalia.  No lesions. No discharge or bleeding.  Bladder/urethra:  No lesions or masses, well supported bladder  Vagina: normal, smooth , no lesions  Cervix and uterus surgically absent  Adnexa: palpble firm, somewhat nodular masses in the pelvis and posterior cul de sac >6cm in dimension. Rectal  Good tone, The posterior cul de sac mass (presumably left ovary) is palpable and minimally mobile but not obviously fixed to rectum.  Extremities  No bilateral cyanosis, clubbing or edema.   Thereasa Solo, MD  03/16/2018, 12:36 PM

## 2018-03-16 NOTE — Telephone Encounter (Signed)
Left detailed message for pharmacy in secure mailbox to refill as sent for 30 pills with no refills per Dr. [place].

## 2018-03-16 NOTE — H&P (View-Only) (Signed)
Follow-up Note: Gyn-Onc CC:  Chief Complaint  Patient presents with  . Ovarian Cancer    Assessment/Plan:  [name]  is a 73 y.o.  year old with stage IIIC ovarian cancer, s/p 3 cycles carboplatin and paclitaxel (day 1 of cycle 3 on 03/02/18).  Haley [name] is having a good clinical response with improved ability to eat, resolution of GI obstruction and decreasing CA 125. Her CT scan changes are more modest. However, there does not appear to be disease that is not amenable to debulking on scan.  I am recommending interval cytoreduction with ex lap, BSO, omentectomy, radical tumor debulking. I discussed the  Risks of surgery including  bleeding, infection, damage to internal organs (such as bladder,ureters, bowels), blood clot, reoperation and rehospitalization. These are elevated in patients on recent chemotherapy and those with history of anticoagulant use.  I will have Haley [name] stop her xarelto 5 days preop and restart it 1 week postop (with a bridge of postop prophylactic dose lovenox x 1 week). We will teach her regarding injections.  She will be bowel prepped in case of possibility of rectal resection. There is a small chance of this given her pelvic exam findings.  HPI:    [name] is a 73 year old woman who was seen in consultation at the request of Dr Phineas Real in January, 2020 (initially by Dr Precious Haws) for apparent stage IIIC ovarian cancer. At that time she had urinary incontinence about 1 year. This started worsening in Sept 2019. She saw urology and was told she was "backed up" in her words. She was given a medication for the incontinence and associates the use of this medication with the start of constipation. Thinking it was the medication she tried to change her diet to help. When the second week came around she started noticing back pain. By the 3rd week this was fairly severe. After discussing with her PCP she was started on Miralax and colace and mag citrate x 1.  This helped with her BM but she was starting to feel swollen in her abdomen.   On 01/06/18 she felt significant pain and cramps and due to pain went to [place]. Imaging revealed a pelvic mass and carcinomatosis concerning for ovarian cancer.   01/06/18 CT notes: IMPRESSION: 1. Complex cystic mass at the right adnexa, measuring 5.9 x 4.0 cm, with nodular components, concerning for primary ovarian malignancy. 2. Diffuse nodularity along the omentum at the left side of the abdomen, extending into the mesentery at the left mid abdomen, concerning for peritoneal carcinomatosis. 3. Wall thickening at the distal ileum adjacent to the ovarian mass; bowel loops appear somewhat adherent to the ovarian mass. Bowel infiltration with tumor cannot be excluded. No evidence of bowel obstruction at this time. 4. Small volume ascites within the abdomen and pelvis.  She had some nausea/emesis and significant difficulty eating.   She followed up with Dr.Fontaine, who upon reviewing her case encouraged her to followup here with gynecologic oncology, also out of concern for ovarian cancer. CA125 was drawn with Dr. Phineas Real and is quite elevated at 3004  Due to her apparent small bowel involvement with obstruction, she was treated with neoadjuvant chemotherapy for 3 cycles (starting on 01/18/18).  She returns to the office for follow-up having now undergone 3 cycles of neoadjuvant chemotherapy. She is feeling much better and her bowels have normalized.  She had an initial paracentesis but did not need a repeat. Cytology from that showed 01/15/18  PERITONEAL/ASCITIC FLUID (SPECIMEN 1 OF 1 COLLECTED 01/18/18): MALIGNANT CELLS CONSISTENT WITH METASTATIC ADENOCARCINOMA. SEE COMMENT. COMMENT: THE MALIGNANT CELLS ARE POSITIVE FOR MOC-31, CYTOKERATIN 7, ESTROGEN RECEPTOR, PAX-8, AND WT-1. THEY ARE NEGATIVE FOR CALRETININ, CYTOKERATIN 5/6, AND CYTOKERATIN 20. THE PROFILE IS CONSISTENT WITH A PRIMARY GYNECOLOGIC CARCINOMA.  THERE IS LIKELY SUFFICIENT TUMOR PRESENT, IF ADDITIONAL STUDIES ARE REQUESTED.  Noted some arthralgias but overall feels well.   CA 125 had improved at the time of cycle 3 and was 174.   CT abd/pelvis on March 15, 2018 showed bilateral adnexal masses remained in situ measuring 6.2 on the right previously 4.9 cm and 4.7 cm on the left previously 5.3 cm.  The ascites had resolved.  The omental caking was mildly improved.  There was no suspicious lymphadenopathy.  There was mild bilateral hydronephrosis that was new felt to be secondary to extrinsic compression from the adnexal masses.  No new lesions were seen.   Current Meds:  Outpatient Encounter Medications as of 03/16/2018  Medication Sig  . ALPRAZolam (XANAX) 0.5 MG tablet TAKE ONE TABLET BY MOUTH ONCE DAILY AS NEEDED (Patient taking differently: Take 0.25-0.5 mg by mouth daily as needed for anxiety. TAKE ONE TABLET BY MOUTH ONCE DAILY AS NEEDED)  . Calcium Carbonate (CALTRATE 600) 1500 MG TABS Take by mouth daily.   . Carboxymethylcellulose Sodium (EYE DROPS OP) Apply 1 drop to eye daily as needed. Thera Tears  . cholecalciferol (VITAMIN D) 1000 UNITS tablet Take 1,000 Units by mouth daily.   Marland Kitchen dexamethasone (DECADRON) 4 MG tablet Take 2 tabs at the night before and 2 tabs the morning of chemotherapy, every 3 weeks, by mouth  . fluticasone (FLONASE) 50 MCG/ACT nasal spray Use 2 sprays in each nostril daily  . hydrocortisone (ANUSOL-HC) 25 MG suppository Place 1 suppository (25 mg total) rectally 2 (two) times daily as needed for hemorrhoids or anal itching.  Marland Kitchen ibuprofen (ADVIL,MOTRIN) 200 MG tablet Take 600 mg by mouth every 6 (six) hours as needed for moderate pain.   Marland Kitchen lactose free nutrition (BOOST PLUS) LIQD Take 237 mLs by mouth daily.  Marland Kitchen levothyroxine (SYNTHROID, LEVOTHROID) 75 MCG tablet Take 1 tablet (75 mcg total) by mouth daily.  Marland Kitchen lidocaine-prilocaine (EMLA) cream Apply 1 application topically as needed.  . Menthol-Methyl  Salicylate (MUSCLE RUB) 10-15 % CREA Apply 1 application topically as needed for muscle pain.  . metoprolol succinate (TOPROL-XL) 50 MG 24 hr tablet Take 1 tablet (50 mg total) by mouth daily. Take with or immediately following a meal.  . Multiple Vitamins-Minerals (MULTIVITAMIN WITH MINERALS) tablet Take 1 tablet by mouth daily.    . Omega-3 Fatty Acids (FISH OIL) 1000 MG CAPS Take 2 capsules by mouth 2 (two) times daily.  Marland Kitchen omeprazole (PRILOSEC) 40 MG capsule Take 40 mg by mouth daily.  . ondansetron (ZOFRAN) 8 MG tablet Take 1 tablet (8 mg total) by mouth every 8 (eight) hours as needed for nausea.  Marland Kitchen oxyCODONE (OXY IR/ROXICODONE) 5 MG immediate release tablet Take 1 tablet (5 mg total) by mouth every 4 (four) hours as needed for severe pain.  . polyethylene glycol (MIRALAX / GLYCOLAX) packet Take 17 g by mouth daily. (Patient taking differently: Take 17 g by mouth daily as needed. )  . prochlorperazine (COMPAZINE) 10 MG tablet Take 1 tablet (10 mg total) by mouth every 6 (six) hours as needed for nausea or vomiting.  . rivaroxaban (XARELTO) 20 MG TABS tablet Take 1 tablet (20 mg total) by mouth daily  with supper.  . simvastatin (ZOCOR) 20 MG tablet Take 1 tablet (20 mg total) by mouth at bedtime.  . vitamin C (ASCORBIC ACID) 500 MG tablet Take 500 mg by mouth 2 (two) times daily.    No facility-administered encounter medications on file as of 03/16/2018.     Allergy:  Allergies  Allergen Reactions  . Tramadol Hcl     REACTION: jittery  . Tramadol Other (See Comments)    Social Hx:   Social History   Socioeconomic History  . Marital status: Married    Spouse name: Not on file  . Number of children: Not on file  . Years of education: Not on file  . Highest education level: Not on file  Occupational History  . Not on file  Social Needs  . Financial resource strain: Not on file  . Food insecurity:    Worry: Not on file    Inability: Not on file  . Transportation needs:     Medical: Not on file    Non-medical: Not on file  Tobacco Use  . Smoking status: Light Tobacco Smoker    Packs/day: 0.50    Years: 10.00    Pack years: 5.00    Types: Cigars  . Smokeless tobacco: Never Used  . Tobacco comment: peach flavor   Substance and Sexual Activity  . Alcohol use: No    Alcohol/week: 0.0 standard drinks  . Drug use: No  . Sexual activity: Yes    Comment: 1st intercourse 17yo-1 partner  Lifestyle  . Physical activity:    Days per week: Not on file    Minutes per session: Not on file  . Stress: Not on file  Relationships  . Social connections:    Talks on phone: Not on file    Gets together: Not on file    Attends religious service: Not on file    Active member of club or organization: Not on file    Attends meetings of clubs or organizations: Not on file    Relationship status: Not on file  . Intimate partner violence:    Fear of current or ex partner: Not on file    Emotionally abused: Not on file    Physically abused: Not on file    Forced sexual activity: Not on file  Other Topics Concern  . Not on file  Social History Narrative  . Not on file    Past Surgical Hx:  Past Surgical History:  Procedure Laterality Date  . ABDOMINAL HYSTERECTOMY  1989  . APPENDECTOMY  1989   with hysterectomy  . BREAST EXCISIONAL BIOPSY Left 1995   Benign  . COLONOSCOPY  11-20-2000  . IR IMAGING GUIDED PORT INSERTION  01/17/2018  . NOSE SURGERY  1990    Past Medical Hx:  Past Medical History:  Diagnosis Date  . Allergy    SEASONAL  . Cataract    BILATERAL  . Family history of lung cancer   . Family history of prostate cancer   . Family history of prostate cancer   . Family history of uterine cancer   . Fibromyalgia   . GERD (gastroesophageal reflux disease)   . H/O echocardiogram 04/28/08   EF>55% trace mitral regurgitation, No significant valvular pathology  . Hemorrhoids    internal  . History of stress test 02/08/2011   Normal Myocardial  perfusion study, this is a low risk scan, No prior study available for comparison  . Hyperlipidemia   . MVP (mitral valve  prolapse)    mild and MR  . OA (osteoarthritis)   . Osteoporosis   . Paroxysmal A-fib ([place])   . Sleep apnea    does not use prescribed CPAP  . Thyroid disease    HYPO  . Varicosities of leg   . Vitamin D deficiency     Past Gynecological History:  Abdominal hysterectomy for benign desease. No LMP recorded. Patient has had a hysterectomy.  Family Hx:  Family History  Problem Relation Age of Onset  . Diabetes Daughter   . Diabetes Mother   . Hypertension Mother   . Heart disease Mother   . Stroke Maternal Grandmother   . Lung cancer Niece   . Cancer Paternal Aunt        type of cancer unk  . Prostate cancer Paternal Uncle   . Uterine cancer Cousin        dx under 40  . Colon cancer Neg Hx     Review of Systems:  Constitutional  Feels fatigued   ENT Normal appearing ears and nares bilaterally Skin/Breast  No rash, sores, jaundice, itching, dryness Cardiovascular  No chest pain, shortness of breath, or edema  Pulmonary  No cough or wheeze.  Gastro Intestinal  No nausea, vomitting, or diarrhoea. No bright red blood per rectum, no abdominal pain, change in bowel movement, or constipation. + rectal pain from hemorrhoids Genito Urinary  No frequency, urgency, dysuria, no bleeding Musculo Skeletal  No myalgia, arthralgia, joint swelling or pain  Neurologic  No weakness, numbness, change in gait,  Psychology  No depression, anxiety, insomnia.   Vitals:  Blood pressure 118/69, pulse 69, temperature 98.2 F (36.8 C), temperature source Oral, resp. rate 18, height 5\' 2"  (1.575 m), weight 137 lb (62.1 kg), SpO2 98 %.  Physical Exam: WD in NAD Neck  Supple NROM, without any enlargements.  Lymph Node Survey No cervical supraclavicular or inguinal adenopathy Cardiovascular  Pulse normal rate, regularity and rhythm. S1 and S2 normal.  Lungs   Clear to auscultation bilateraly, without wheezes/crackles/rhonchi. Good air movement.  Skin  No rash/lesions/breakdown  Psychiatry  Alert and oriented to person, place, and time  Abdomen  Normoactive bowel sounds, abdomen soft, non-tender and nonobese without evidence of hernia.  Back No CVA tenderness Genito Urinary  Vulva/vagina: Normal external female genitalia.  No lesions. No discharge or bleeding.  Bladder/urethra:  No lesions or masses, well supported bladder  Vagina: normal, smooth , no lesions  Cervix and uterus surgically absent  Adnexa: palpble firm, somewhat nodular masses in the pelvis and posterior cul de sac >6cm in dimension. Rectal  Good tone, The posterior cul de sac mass (presumably left ovary) is palpable and minimally mobile but not obviously fixed to rectum.  Extremities  No bilateral cyanosis, clubbing or edema.   Thereasa Solo, MD  03/16/2018, 12:36 PM

## 2018-03-16 NOTE — Patient Instructions (Signed)
Preparing for your Surgery  Plan for surgery on March 27, 2018 with Dr. Everitt Amber at [facility] will be scheduled for a exploratory laparotomy, bilateral salpingo-oophorectomy, omentectomy, radical tumor debulking, possible bowel resection.   Pre-operative Testing -You will receive a phone call from presurgical testing at [facility] if you have not received a call already to arrange for a pre-operative testing appointment before your surgery.  This appointment normally occurs one to two weeks before your scheduled surgery.   -Bring your insurance card, copy of an advanced directive if applicable, medication list  -At that visit, you will be asked to sign a consent for a possible blood transfusion in case a transfusion becomes necessary during surgery.  The need for a blood transfusion is rare but having consent is a necessary part of your care.     -STOP XARELTO 5 DAYS PRE-OPERATIVELY. Last dose March 21, 2018.  -As part of our enhanced surgical recovery pathway, you may be advised to drink a carbohydrate drink the morning of surgery (at least 3 hours before). If you are diabetic, this will be avoided in order to prevent elevated glucose levels.  Day Before Surgery at [place] will be asked to take in a light diet the day before surgery.  Avoid carbonated beverages.  You will be advised to have nothing to eat or drink after midnight the evening before.    Eat a light diet the day before surgery.  Examples including soups, broths, toast, yogurt, mashed potatoes.  Things to avoid include carbonated beverages (fizzy beverages), raw fruits and raw vegetables, or beans.   If your bowels are filled with gas, your surgeon will have difficulty visualizing your pelvic organs which increases your surgical risks.  Starting at 4pm the day before surgery, begin drinking two bottles of magnesium citrate and only take in clear liquids after that time.  Your role in recovery Your  role is to become active as soon as directed by your doctor, while still giving yourself time to heal.  Rest when you feel tired. You will be asked to do the following in order to speed your recovery:  - Cough and breathe deeply. This helps toclear and expand your lungs and can prevent pneumonia. You may be given a spirometer to practice deep breathing. A staff member will show you how to use the spirometer. - Do mild physical activity. Walking or moving your legs help your circulation and body functions return to normal. A staff member will help you when you try to walk and will provide you with simple exercises. Do not try to get up or walk alone the first time. - Actively manage your pain. Managing your pain lets you move in comfort. We will ask you to rate your pain on a scale of zero to 10. It is your responsibility to tell your doctor or nurse where and how much you hurt so your pain can be treated.  Special Considerations -If you are diabetic, you may be placed on insulin after surgery to have closer control over your blood sugars to promote healing and recovery.  This does not mean that you will be discharged on insulin.  If applicable, your oral antidiabetics will be resumed when you are tolerating a solid diet.  -Your final pathology results from surgery should be available around one week after surgery and the results will be relayed to you when available.  -Dr. Chrissie Noa is the Surgeon that assists your GYN Oncologist  with surgery.  The next day after your surgery you will either see Dr. Denman George or Dr. Lahoma Crocker.  -FMLA forms can be faxed to [phone] and please allow 5-7 business days for completion.  Pain Management After Surgery -You have been prescribed your pain medication and bowel regimen medications before surgery so that you can have these available when you are discharged from the hospital. The pain medication is for use ONLY AFTER surgery and a new prescription  will not be given.   -Make sure that you have Tylenol and Ibuprofen at home to use on a regular basis after surgery for pain control. We recommend alternating the medications every hour to six hours since they work differently and are processed in the body differently for pain relief.  -Review the attached handout on narcotic use and their risks and side effects.   Bowel Regimen -You have been prescribed Sennakot-S to take nightly to prevent constipation especially if you are taking the narcotic pain medication intermittently.  It is important to prevent constipation and drink adequate amounts of liquids.   Blood Transfusion Information WHAT IS A BLOOD TRANSFUSION? A transfusion is the replacement of blood or some of its parts. Blood is made up of multiple cells which provide different functions.  Red blood cells carry oxygen and are used for blood loss replacement.  White blood cells fight against infection.  Platelets control bleeding.  Plasma helps clot blood.  Other blood products are available for specialized needs, such as hemophilia or other clotting disorders. BEFORE THE TRANSFUSION  Who gives blood for transfusions?   You may be able to donate blood to be used at a later date on yourself (autologous donation).  Relatives can be asked to donate blood. This is generally not any safer than if you have received blood from a stranger. The same precautions are taken to ensure safety when a relative's blood is donated.  Healthy volunteers who are fully evaluated to make sure their blood is safe. This is blood bank blood. Transfusion therapy is the safest it has ever been in the practice of medicine. Before blood is taken from a donor, a complete history is taken to make sure that person has no history of diseases nor engages in risky social behavior (examples are intravenous drug use or sexual activity with multiple partners). The donor's travel history is screened to minimize risk  of transmitting infections, such as malaria. The donated blood is tested for signs of infectious diseases, such as HIV and hepatitis. The blood is then tested to be sure it is compatible with you in order to minimize the chance of a transfusion reaction. If you or a relative donates blood, this is often done in anticipation of surgery and is not appropriate for emergency situations. It takes many days to process the donated blood. RISKS AND COMPLICATIONS Although transfusion therapy is very safe and saves many lives, the main dangers of transfusion include:   Getting an infectious disease.  Developing a transfusion reaction. This is an allergic reaction to something in the blood you were given. Every precaution is taken to prevent this. The decision to have a blood transfusion has been considered carefully by your caregiver before blood is given. Blood is not given unless the benefits outweigh the risks.

## 2018-03-16 NOTE — Telephone Encounter (Signed)
Please ask pharmacy to fill prochlorperazine as prescribed.  This medication was prescribed initially by her oncologist, I did not change amount, it is not supposed to be 42-month supply.  Haley [name] can request refills if needed from her oncologist.  Thanks, BJ

## 2018-03-16 NOTE — Telephone Encounter (Signed)
Encounter complete. 

## 2018-03-17 MED ORDER — ALPRAZOLAM 0.5 MG PO TABS: 0.2500 mg | ORAL_TABLET | Freq: Every day | ORAL | 2 refills | Status: DC | PRN

## 2018-03-19 ENCOUNTER — Other Ambulatory Visit: Payer: Self-pay | Admitting: Gynecologic Oncology

## 2018-03-19 ENCOUNTER — Telehealth: Payer: Self-pay | Admitting: Family Medicine

## 2018-03-19 DIAGNOSIS — F419 Anxiety disorder, unspecified: Secondary | ICD-10-CM

## 2018-03-19 DIAGNOSIS — C569 Malignant neoplasm of unspecified ovary: Secondary | ICD-10-CM

## 2018-03-19 NOTE — Telephone Encounter (Signed)
Copied from [place] [id number]. Topic: Quick Communication - Rx Refill/Question >> Mar 19, 2018  9:07 AM Robina Ade, Helene Kelp D wrote: Medication: ALPRAZolam Duanne Moron) 0.5 MG tablet  Has the patient contacted their pharmacy? Yes. Elmo Putt with [facility] is calling because they need a better instruction of how patient needs to use medication due to quantity. She can be reached at [phone] and can leave a detail message with callers Full name please. (Agent: If no, request that the patient contact the pharmacy for the refill.) (Agent: If yes, when and what did the pharmacy advise?)  Preferred Pharmacy (with phone number or street name): [facility] ([place], [place]  Agent: Please be advised that RX refills may take up to 3 business days. We ask that you follow-up with your pharmacy.

## 2018-03-20 ENCOUNTER — Ambulatory Visit (HOSPITAL_COMMUNITY)
Admission: RE | Admit: 2018-03-20 | Discharge: 2018-03-20 | Disposition: A | Discharge status: Home / Self Care | Payer: PPO | Source: Ambulatory Visit | Attending: Cardiology | Admitting: Cardiology

## 2018-03-20 ENCOUNTER — Telehealth: Payer: Self-pay | Admitting: Licensed Clinical Social Worker

## 2018-03-20 ENCOUNTER — Other Ambulatory Visit: Payer: Self-pay

## 2018-03-20 DIAGNOSIS — I480 Paroxysmal atrial fibrillation: Secondary | ICD-10-CM | POA: Diagnosis not present

## 2018-03-20 DIAGNOSIS — Z01818 Encounter for other preprocedural examination: Secondary | ICD-10-CM | POA: Insufficient documentation

## 2018-03-20 DIAGNOSIS — Z01810 Encounter for preprocedural cardiovascular examination: Secondary | ICD-10-CM

## 2018-03-20 DIAGNOSIS — R079 Chest pain, unspecified: Secondary | ICD-10-CM | POA: Diagnosis not present

## 2018-03-20 LAB — MYOCARDIAL PERFUSION IMAGING
LV dias vol: 84 mL (ref 46–106)
LV sys vol: 36 mL
Peak HR: 83 {beats}/min
Rest HR: 63 {beats}/min
SDS: 2
SRS: 0
SSS: 2
TID: 1.25

## 2018-03-20 MED ORDER — TECHNETIUM TC 99M TETROFOSMIN IV KIT
27.0000 | PACK | Freq: Once | INTRAVENOUS | Status: AC | PRN
  Administered 2018-03-20: 27 via INTRAVENOUS
  Filled 2018-03-20: qty 27

## 2018-03-20 MED ORDER — TECHNETIUM TC 99M TETROFOSMIN IV KIT
9.8000 | PACK | Freq: Once | INTRAVENOUS | Status: AC | PRN
  Administered 2018-03-20: 9.8 via INTRAVENOUS
  Filled 2018-03-20: qty 10

## 2018-03-20 MED ORDER — AMINOPHYLLINE 25 MG/ML IV SOLN
75.0000 mg | Freq: Once | INTRAVENOUS | Status: AC
  Administered 2018-03-20: 75 mg via INTRAVENOUS

## 2018-03-20 MED ORDER — TECHNETIUM TC 99M TETROFOSMIN IV KIT
1120.0000 | PACK | Freq: Once | INTRAVENOUS | Status: DC | PRN
  Filled 2018-03-20: qty 1120

## 2018-03-20 MED ORDER — REGADENOSON 0.4 MG/5ML IV SOLN
0.4000 mg | Freq: Once | INTRAVENOUS | Status: AC
  Administered 2018-03-20: 0.4 mg via INTRAVENOUS

## 2018-03-20 NOTE — Patient Outreach (Signed)
[facility]) Care Management  03/20/2018  [place] [birth year] [id number]    Mrs. Ryle left a voice message acknowledging receipt of previous calls. Reported having issues with the phone volume. Will follow up with RNCM when she is available.  PLAN Will continue routine outreach.   [place] [phone]

## 2018-03-20 NOTE — Telephone Encounter (Signed)
Prescription for Xanax was sent to her pharmacy after recent visit. Tylisa Martinique, MD

## 2018-03-21 ENCOUNTER — Telehealth: Payer: Self-pay | Admitting: Family Medicine

## 2018-03-21 ENCOUNTER — Telehealth: Payer: Self-pay | Admitting: *Deleted

## 2018-03-21 ENCOUNTER — Telehealth: Payer: Self-pay | Admitting: Oncology

## 2018-03-21 NOTE — Telephone Encounter (Signed)
Got it  LK

## 2018-03-21 NOTE — Telephone Encounter (Signed)
Alissa w/[facility] [phone] needs clarification on the patient's Xanax medication.  She needs to know how long is the 30 tablets are suppose to last.

## 2018-03-21 NOTE — Telephone Encounter (Signed)
Alissa from [facility] called to clarify Xanax dose. Informed Alissa the medication listed on AML is alprazolam 0.5 mg tablet: Take 0.5-1 tablet (0.25- 0.5 mg total) by mouth daily as needed for anxiety.  Alissa verbalized understanding.

## 2018-03-21 NOTE — Telephone Encounter (Signed)
Luke I see you did her pre-op so will send this to you thanks.

## 2018-03-21 NOTE — Progress Notes (Signed)
ekg 03-08-18 epic  Stress test 03-20-18 epic   Ok to proceed  with surgery in echo report Kerin Ransom ,[place]  Echo 01-31-18 epic  Ct abd and pelvis 03-15-18 epic cxr 01-30-18 epic

## 2018-03-21 NOTE — Telephone Encounter (Signed)
   Primary Cardiologist: Shelva Majestic, MD  Chart reviewed and patient seen and examined 03/08/2018 as part of pre-operative protocol coverage. Myoview stress test done 03/20/2018 was normal. Given these results, past medical history, and time since last visit, based on ACC/AHA guidelines, Haley [name] would be at acceptable risk for the planned procedure without further cardiovascular testing.   Ok to hold Xarelto 48 hours pre op.   I will route this recommendation to the requesting party via Epic fax function and remove from pre-op pool.  Please call with questions.  Kerin Ransom, PA-C 03/21/2018, 4:55 PM

## 2018-03-21 NOTE — Telephone Encounter (Signed)
   [facility] Pre-operative Risk Assessment    Request for surgical clearance:  1. What type of surgery is being performed? OPEN VS ROBOTIC SALPINGO OOPHORECTOMY, OMENTECTOMY, DEBULKING POSSIBLE BOWEL RESECTION     2. When is this surgery scheduled? 03/27/18   3. What type of clearance is required (medical clearance vs. Pharmacy clearance to hold med vs. Both)? BOTH  4. Are there any medications that need to be held prior to surgery and how long? [place]    5. Practice name and name of physician performing surgery? [facility] EMMA ROSSI     6. What is your office phone number? [phone]   7.   What is your office fax number? [phone]   8.   Anesthesia type (None, local, MAC, general) ? GENERAL

## 2018-03-21 NOTE — Patient Instructions (Signed)
[place]  03/21/2018   Your procedure is scheduled on: 03-27-18   Report to [facility] Main  Entrance               Report to admitting at         1030  AM    Call this number if you have problems the morning of surgery [phone]    Remember: Eat a light diet the day before surgery.  Examples including soups, broths, toast, yogurt, mashed potatoes.  Things to avoid include carbonated beverages (fizzy beverages), raw fruits and raw vegetables, or beans.   If your bowels are filled with gas, your surgeon will have difficulty visualizing your pelvic organs which increases your surgical risks.  NO SOLID FOOD AFTER MIDNIGHT THE NIGHT PRIOR TO SURGERY. NOTHING BY MOUTH EXCEPT CLEAR LIQUIDS UNTIL 3 HOURS PRIOR TO [place] SURGERY. PLEASE FINISH ENSURE DRINK PER SURGEON ORDER 3 HOURS PRIOR TO SCHEDULED SURGERY TIME WHICH NEEDS TO BE COMPLETED AT ____0930 am then nothing by mouth________.   BRUSH YOUR TEETH MORNING OF SURGERY AND RINSE YOUR MOUTH OUT, NO CHEWING GUM CANDY OR MINTS.     Take these medicines the morning of surgery with A SIP OF WATER: omeprazole, metoprolol, levothyroxine                                You may not have any metal on your body including hair pins and              piercings  Do not wear jewelry, make-up, lotions, powders or perfumes, deodorant             Do not wear nail polish.  Do not shave  48 hours prior to surgery.     Do not bring valuables to the hospital. [place].  Contacts, dentures or bridgework may not be worn into surgery.  Leave suitcase in the car. After surgery it may be brought to your room.               Please read over the following fact sheets you were given: _____________________________________________________________________           [facility] - Preparing for Surgery Before surgery, you can play an important role.  Because skin is not sterile, your  skin needs to be as free of germs as possible.  You can reduce the number of germs on your skin by washing with CHG (chlorahexidine gluconate) soap before surgery.  CHG is an antiseptic cleaner which kills germs and bonds with the skin to continue killing germs even after washing. Please DO NOT use if you have an allergy to CHG or antibacterial soaps.  If your skin becomes reddened/irritated stop using the CHG and inform your nurse when you arrive at Short Stay. Do not shave (including legs and underarms) for at least 48 hours prior to the first CHG shower.  You may shave your face/neck. Please follow these instructions carefully:  1.  Shower with CHG Soap the night before surgery and the  morning of Surgery.  2.  If you choose to wash your hair, wash your hair first as usual with your  normal  shampoo.  3.  After you shampoo, rinse  your hair and body thoroughly to remove the  shampoo.                           4.  Use CHG as you would any other liquid soap.  You can apply chg directly  to the skin and wash                       Gently with a scrungie or clean washcloth.  5.  Apply the CHG Soap to your body ONLY FROM THE NECK DOWN.   Do not use on face/ open                           Wound or open sores. Avoid contact with eyes, ears mouth and genitals (private parts).                       Wash face,  Genitals (private parts) with your normal soap.             6.  Wash thoroughly, paying special attention to the area where your surgery  will be performed.  7.  Thoroughly rinse your body with warm water from the neck down.  8.  DO NOT shower/wash with your normal soap after using and rinsing off  the CHG Soap.                9.  Pat yourself dry with a clean towel.            10.  Wear clean pajamas.            11.  Place clean sheets on your bed the night of your first shower and do not  sleep with pets. Day of Surgery : Do not apply any lotions/deodorants the morning of surgery.  Please wear  clean clothes to the hospital/surgery center.  FAILURE TO FOLLOW THESE INSTRUCTIONS MAY RESULT IN THE CANCELLATION OF YOUR SURGERY PATIENT SIGNATURE_________________________________  NURSE SIGNATURE__________________________________  ________________________________________________________________________  WHAT IS A BLOOD TRANSFUSION? Blood Transfusion Information  A transfusion is the replacement of blood or some of its parts. Blood is made up of multiple cells which provide different functions.  Red blood cells carry oxygen and are used for blood loss replacement.  White blood cells fight against infection.  Platelets control bleeding.  Plasma helps clot blood.  Other blood products are available for specialized needs, such as hemophilia or other clotting disorders. BEFORE THE TRANSFUSION  Who gives blood for transfusions?   Healthy volunteers who are fully evaluated to make sure their blood is safe. This is blood bank blood. Transfusion therapy is the safest it has ever been in the practice of medicine. Before blood is taken from a donor, a complete history is taken to make sure that person has no history of diseases nor engages in risky social behavior (examples are intravenous drug use or sexual activity with multiple partners). The donor's travel history is screened to minimize risk of transmitting infections, such as malaria. The donated blood is tested for signs of infectious diseases, such as HIV and hepatitis. The blood is then tested to be sure it is compatible with you in order to minimize the chance of a transfusion reaction. If you or a relative donates blood, this is often done in anticipation of surgery and is not appropriate for emergency situations. It takes many days  to process the donated blood. RISKS AND COMPLICATIONS Although transfusion therapy is very safe and saves many lives, the main dangers of transfusion include:   Getting an infectious  disease.  Developing a transfusion reaction. This is an allergic reaction to something in the blood you were given. Every precaution is taken to prevent this. The decision to have a blood transfusion has been considered carefully by your caregiver before blood is given. Blood is not given unless the benefits outweigh the risks. AFTER THE TRANSFUSION  Right after receiving a blood transfusion, you will usually feel much better and more energetic. This is especially true if your red blood cells have gotten low (anemic). The transfusion raises the level of the red blood cells which carry oxygen, and this usually causes an energy increase.  The nurse administering the transfusion will monitor you carefully for complications. HOME CARE INSTRUCTIONS  No special instructions are needed after a transfusion. You may find your energy is better. Speak with your caregiver about any limitations on activity for underlying diseases you may have. SEEK MEDICAL CARE IF:   Your condition is not improving after your transfusion.  You develop redness or irritation at the intravenous (IV) site. SEEK IMMEDIATE MEDICAL CARE IF:  Any of the following symptoms occur over the next 12 hours:  Shaking chills.  You have a temperature by mouth above 102 F (38.9 C), not controlled by medicine.  Chest, back, or muscle pain.  People around you feel you are not acting correctly or are confused.  Shortness of breath or difficulty breathing.  Dizziness and fainting.  You get a rash or develop hives.  You have a decrease in urine output.  Your urine turns a dark color or changes to pink, red, or brown. Any of the following symptoms occur over the next 10 days:  You have a temperature by mouth above 102 F (38.9 C), not controlled by medicine.  Shortness of breath.  Weakness after normal activity.  The white part of the eye turns yellow (jaundice).  You have a decrease in the amount of urine or are  urinating less often.  Your urine turns a dark color or changes to pink, red, or brown. Document Released: 12/25/1999 Document Revised: 03/21/2011 Document Reviewed: 08/13/2007 ExitCare Patient Information 2014 [place].  _______________________________________________________________________  Incentive Spirometer  An incentive spirometer is a tool that can help keep your lungs clear and active. This tool measures how well you are filling your lungs with each breath. Taking long deep breaths may help reverse or decrease the chance of developing breathing (pulmonary) problems (especially infection) following:  A long period of time when you are unable to move or be active. BEFORE THE PROCEDURE   If the spirometer includes an indicator to show your best effort, your nurse or respiratory therapist will set it to a desired goal.  If possible, sit up straight or lean slightly forward. Try not to slouch.  Hold the incentive spirometer in an upright position. INSTRUCTIONS FOR USE  1. Sit on the edge of your bed if possible, or sit up as far as you can in bed or on a chair. 2. Hold the incentive spirometer in an upright position. 3. Breathe out normally. 4. Place the mouthpiece in your mouth and seal your lips tightly around it. 5. Breathe in slowly and as deeply as possible, raising the piston or the ball toward the top of the column. 6. Hold your breath for 3-5 seconds or for as long  as possible. Allow the piston or ball to fall to the bottom of the column. 7. Remove the mouthpiece from your mouth and breathe out normally. 8. Rest for a few seconds and repeat Steps 1 through 7 at least 10 times every 1-2 hours when you are awake. Take your time and take a few normal breaths between deep breaths. 9. The spirometer may include an indicator to show your best effort. Use the indicator as a goal to work toward during each repetition. 10. After each set of 10 deep breaths, practice coughing  to be sure your lungs are clear. If you have an incision (the cut made at the time of surgery), support your incision when coughing by placing a pillow or rolled up towels firmly against it. Once you are able to get out of bed, walk around indoors and cough well. You may stop using the incentive spirometer when instructed by your caregiver.  RISKS AND COMPLICATIONS  Take your time so you do not get dizzy or light-headed.  If you are in pain, you may need to take or ask for pain medication before doing incentive spirometry. It is harder to take a deep breath if you are having pain. AFTER USE  Rest and breathe slowly and easily.  It can be helpful to keep track of a log of your progress. Your caregiver can provide you with a simple table to help with this. If you are using the spirometer at home, follow these instructions: [place] IF:   You are having difficultly using the spirometer.  You have trouble using the spirometer as often as instructed.  Your pain medication is not giving enough relief while using the spirometer.  You develop fever of 100.5 F (38.1 C) or higher. SEEK IMMEDIATE MEDICAL CARE IF:   You cough up bloody sputum that had not been present before.  You develop fever of 102 F (38.9 C) or greater.  You develop worsening pain at or near the incision site. MAKE SURE YOU:   Understand these instructions.  Will watch your condition.  Will get help right away if you are not doing well or get worse. Document Released: 05/09/2006 Document Revised: 03/21/2011 Document Reviewed: 07/10/2006 [facility] Patient Information 2014 [place], [place].   ________________________________________________________________________

## 2018-03-21 NOTE — Telephone Encounter (Signed)
Refaxed cardiac clearance form to [facility] at [phone].

## 2018-03-22 ENCOUNTER — Encounter (HOSPITAL_COMMUNITY)
Admission: RE | Admit: 2018-03-22 | Discharge: 2018-03-22 | Disposition: A | Discharge status: Home / Self Care | Payer: PPO | Source: Ambulatory Visit | Attending: Gynecologic Oncology | Admitting: Gynecologic Oncology

## 2018-03-22 ENCOUNTER — Telehealth: Payer: Self-pay | Admitting: Oncology

## 2018-03-22 ENCOUNTER — Telehealth: Payer: Self-pay

## 2018-03-22 ENCOUNTER — Other Ambulatory Visit: Payer: Self-pay

## 2018-03-22 ENCOUNTER — Encounter (HOSPITAL_COMMUNITY): Payer: Self-pay

## 2018-03-22 DIAGNOSIS — Z01812 Encounter for preprocedural laboratory examination: Secondary | ICD-10-CM | POA: Insufficient documentation

## 2018-03-22 HISTORY — DX: Anxiety disorder, unspecified: F41.9

## 2018-03-22 HISTORY — DX: Hypothyroidism, unspecified: E03.9

## 2018-03-22 HISTORY — DX: Malignant (primary) neoplasm, unspecified: C80.1

## 2018-03-22 HISTORY — DX: Cardiac arrhythmia, unspecified: I49.9

## 2018-03-22 HISTORY — DX: Essential (primary) hypertension: I10

## 2018-03-22 LAB — URINALYSIS, ROUTINE W REFLEX MICROSCOPIC
Bilirubin Urine: NEGATIVE
Glucose, UA: NEGATIVE mg/dL
Hgb urine dipstick: NEGATIVE
Ketones, ur: NEGATIVE mg/dL
Leukocytes,Ua: NEGATIVE
Nitrite: NEGATIVE
Protein, ur: NEGATIVE mg/dL
SPECIFIC GRAVITY, URINE: 1.006 (ref 1.005–1.030)
pH: 6 (ref 5.0–8.0)

## 2018-03-22 LAB — COMPREHENSIVE METABOLIC PANEL
ALT: 10 U/L (ref 0–44)
AST: 26 U/L (ref 15–41)
Albumin: 4.2 g/dL (ref 3.5–5.0)
Alkaline Phosphatase: 74 U/L (ref 38–126)
Anion gap: 6 (ref 5–15)
BILIRUBIN TOTAL: 0.4 mg/dL (ref 0.3–1.2)
BUN: 8 mg/dL (ref 8–23)
CO2: 25 mmol/L (ref 22–32)
Calcium: 9.1 mg/dL (ref 8.9–10.3)
Chloride: 105 mmol/L (ref 98–111)
Creatinine, Ser: 0.73 mg/dL (ref 0.44–1.00)
GFR calc Af Amer: >60 mL/min (ref >60)
GFR calc non Af Amer: >60 mL/min (ref >60)
Glucose, Bld: 95 mg/dL (ref 70–99)
Potassium: 4 mmol/L (ref 3.5–5.1)
Sodium: 136 mmol/L (ref 135–145)
Total Protein: 7.9 g/dL (ref 6.5–8.1)

## 2018-03-22 LAB — CBC
HCT: 41.8 % (ref 36.0–46.0)
Hemoglobin: 12.7 g/dL (ref 12.0–15.0)
MCH: 29.3 pg (ref 26.0–34.0)
MCHC: 30.4 g/dL (ref 30.0–36.0)
MCV: 96.3 fL (ref 80.0–100.0)
Platelets: 246 10*3/uL (ref 150–400)
RBC: 4.34 MIL/uL (ref 3.87–5.11)
RDW: 17.2 % — ABNORMAL HIGH (ref 11.5–15.5)
WBC: 8 10*3/uL (ref 4.0–10.5)
nRBC: 0 % (ref 0.0–0.2)

## 2018-03-22 LAB — ABO/RH: ABO/RH(D): O POS

## 2018-03-22 NOTE — Telephone Encounter (Signed)
Outgoing call to patient per [name] NP regarding her recent lab work looks good and good to go for surgery. Pt voiced understanding and no other needs per pt at this time.

## 2018-03-22 NOTE — Telephone Encounter (Signed)
Haley [name] called and said her stomach was bloated, cramped and hurt after having the stress test on 3/10.  She said it bothered her all day yesterday.  She was not able to eat anything but was able to drink ginger ale.  She vomited once and had a small bm yesterday. She said her stomach felt like when she went to the hospital when she was first diagnosed. She said she feels better today, her stomach has gone down and had a large bm this morning.

## 2018-03-23 ENCOUNTER — Other Ambulatory Visit: Payer: PPO

## 2018-03-23 ENCOUNTER — Ambulatory Visit: Payer: PPO | Admitting: Hematology and Oncology

## 2018-03-23 ENCOUNTER — Ambulatory Visit: Payer: PPO

## 2018-03-23 ENCOUNTER — Telehealth: Payer: Self-pay | Admitting: Oncology

## 2018-03-23 LAB — CA 125: Cancer Antigen (CA) 125: 119 U/mL — ABNORMAL HIGH (ref 0.0–38.1)

## 2018-03-23 NOTE — Telephone Encounter (Signed)
Called Chae back and she is feeling better.  She ate some chicken broth and is going to try some Jello.  She is going to try to walk more today.  Advised her that I will call her Monday morning to check on her.

## 2018-03-23 NOTE — Pre-Procedure Instructions (Signed)
Ca125 results 03/22/2018 sent to Dr. Denman George and Jerilynn Mages. Cross NP via epic.

## 2018-03-23 NOTE — Telephone Encounter (Signed)
Called Haley [name] to schedule a lab appointment for genetic testing after her surgery. She will come in on 04/11/2018 at 2:30 PM

## 2018-03-23 NOTE — Telephone Encounter (Signed)
Haley [name] left a message saying that she is still having gas, bloating and cramping of her stomach.  She is wondering if the fluid needs to be "drawn out" again.    Called her back and advised her that the CT scan from 03/15/18 did not show any fluid in her abdomen.  Advised her that it may be gas causing her stomach to bloat.  She said it is down a little this morning.  She denies having any nausea/vomiting.  She had 3 liquid bowel movements yesterday and is eating a liquid diet.  She said she walked outside for about an hour yesterday and passed some gas.  She is belching and can fell her stomach gurgling.  She is wondering if she can take Maalox.  Discussed with [name], NP and advised Haley [name] that she can take Maalox and follow the package directions.  Advised her that she can eat a soft diet and advised her to call this afternoon to see how she is feeling.  She verbalized agreement.

## 2018-03-26 ENCOUNTER — Telehealth: Payer: Self-pay | Admitting: Oncology

## 2018-03-26 ENCOUNTER — Ambulatory Visit: Payer: Self-pay | Admitting: Pharmacist

## 2018-03-26 NOTE — Telephone Encounter (Signed)
Called Haley [name] to see how she is feeling.  She said she is feeling much better although her stools are still liquid.  Advised her to eat breakfast, wait an hour and then start drinking the mag citrate per Erasmo Leventhal, NP. Advised her that I will call and check on her after lunch.

## 2018-03-26 NOTE — Anesthesia Preprocedure Evaluation (Addendum)
Anesthesia Evaluation  Patient identified by MRN, date of birth, ID band Patient awake    Reviewed: Allergy & Precautions, NPO status , Patient's Chart, lab work & pertinent test results  History of Anesthesia Complications (+) Emergence DeliriumNegative for: history of anesthetic complications  Airway Mallampati: II  TM Distance: >3 FB Neck ROM: Full    Dental  (+) Dental Advisory Given   Pulmonary sleep apnea , Current Smoker,    Pulmonary exam normal        Cardiovascular hypertension, Pt. on medications and Pt. on home beta blockers Normal cardiovascular exam+ dysrhythmias Atrial Fibrillation   Cardiac clearance received 03/21/18.  Per Kerin Ransom, PA-C, "Giventhese results,past medical history,and time since last visit, based on ACC/AHA guidelines, Haley [name] be at acceptable risk for the planned procedure without further cardiovascular testing. Ok to hold Xarelto 48 hours pre op." CV: Stress Test 03/20/2018  Nuclear stress EF: 57%. The left ventricular ejection fraction is normal (55-65%).  This is a low risk study. No evidence of ischemia. No evidence of previous infarction.  The study is normal.  Echo 01/31/18 Study Conclusions  - Left ventricle: The cavity size was normal. Systolic function was normal. The estimated ejection fraction was in the range of 55% to 60%. Wall motion was normal; there were no regional wall motion abnormalities. The study is not technically sufficient to allow evaluation of LV diastolic function. - Aortic valve: Transvalvular velocity was within the normal range. There was no stenosis. There was trivial regurgitation. Valve area (VTI): 2.3 cm^2. Valve area (Vmax): 2.26 cm^2. Valve area (Vmean): 2.09 cm^2. - Mitral valve: Transvalvular velocity was within the normal range. There was no evidence for stenosis. There was trivial regurgitation. Valve area by  continuity equation (using LVOT flow): 2.49 cm^2. - Right ventricle: The cavity size was normal. Wall thickness was normal. Systolic function was normal. - Atrial septum: No defect or patent foramen ovale was identified by color flow Doppler. - Tricuspid valve: There was mild regurgitation. - Pulmonary arteries: Systolic pressure was within the normal range. PA peak pressure: 31 mm Hg (S).   Neuro/Psych Anxiety negative neurological ROS     GI/Hepatic Neg liver ROS, GERD  ,  Endo/Other  Hypothyroidism   Renal/GU negative Renal ROS     Musculoskeletal  (+) Fibromyalgia -  Abdominal   Peds  Hematology negative hematology ROS (+)   Anesthesia Other Findings Day of surgery medications reviewed with the patient.  Reproductive/Obstetrics                          Anesthesia Physical Anesthesia Plan  ASA: III  Anesthesia Plan: General   Post-op Pain Management:    Induction: Intravenous  PONV Risk Score and Plan: 4 or greater and Ondansetron, Dexamethasone, Scopolamine patch - Pre-op and Diphenhydramine  Airway Management Planned: Oral ETT  Additional Equipment:   Intra-op Plan:   Post-operative Plan: Extubation in OR  Informed Consent: I have reviewed the patients History and Physical, chart, labs and discussed the procedure including the risks, benefits and alternatives for the proposed anesthesia with the patient or authorized representative who has indicated his/her understanding and acceptance.     Dental advisory given  Plan Discussed with: CRNA and Anesthesiologist  Anesthesia Plan Comments: (See PST note 03/22/18, Konrad Felix, PA-C)      Anesthesia Quick Evaluation

## 2018-03-26 NOTE — Telephone Encounter (Signed)
Called Haley [name] and she has just finished drinking her first bottle of mag citrate.  She has had one bowel movement this morning. Discussed waiting a few hours before drinking the second bottle and not to drink it if she is having clear stools.  She verbalized agreement. She has been advised to call the GYN clinic if she has any questions this afternoon.

## 2018-03-26 NOTE — Progress Notes (Signed)
Anesthesia Chart Review   Case:  [id number] Date/Time:  03/27/18 1215   Procedures:      OPEN BILATERAL SALPINGOOOPHORECTOMY, POSSIBLE BOWEL RESECTION (N/A )     OMENTECTOMY (N/A )     RADICAL TUMOR DEBULKING (N/A )   Anesthesia type:  General   Pre-op diagnosis:  OVARIAN CANCER   Location:  [facility]   Surgeon:  Everitt Amber, MD      DISCUSSION: 73 yo current smoker with h/o hypothyroidism, MVP, HLD, sleep apnea, anxiety, fibromyalgia, A-fib, GERD, HTN, ovarian cancer scheduled for above procedure 03/27/18 with Dr. Everitt Amber.   Cardiac clearance received 03/21/18.  Per Kerin Ransom, PA-C, " Given these results, past medical history, and time since last visit, based on ACC/AHA guidelines, Haley [name] would be at acceptable risk for the planned procedure without further cardiovascular testing. Ok to hold Xarelto 48 hours pre op."  Pt can proceed with planned procedure barring acute status change.  VS: BP 129/64   Pulse 84   Temp 36.7 C   Resp 16   Ht 5\' 2"  (1.575 m)   Wt 59.7 kg   SpO2 99%   BMI 24.07 kg/m   PROVIDERS: [place], Rasha G, MD is PCP    LABS: Labs reviewed: Acceptable for surgery. (all labs ordered are listed, but only abnormal results are displayed)  Labs Reviewed  CBC - Abnormal; Notable for the following components:      Result Value   RDW 17.2 (*)    All other components within normal limits  CA 125 - Abnormal; Notable for the following components:   Cancer Antigen (CA) 125 119.0 (*)    All other components within normal limits  COMPREHENSIVE METABOLIC PANEL  URINALYSIS, ROUTINE W REFLEX MICROSCOPIC  TYPE AND SCREEN  ABO/RH     IMAGES: CT Abdomen Pelvis 03/15/2018 IMPRESSION: 6.2 cm complex cystic right ovarian mass, compatible with malignant ovarian neoplasm, mildly progressive.  Associated peritoneal disease/omental caking beneath the anterior abdominal wall, mildly improved. Prior abdominal ascites is improved/resolved.  4.7 cm  cystic left ovarian mass, without overt malignant features, grossly unchanged.  Mild bilateral hydroureteronephrosis, secondary to extrinsic compression, new.  Prior small bowel obstruction has improved/resolved.  EKG: 03/08/18 Rate 59 bpm Sinus bradycardia Septal infarct, age undetermined   CV: Stress Test 03/20/2018  Nuclear stress EF: 57%. The left ventricular ejection fraction is normal (55-65%).  This is a low risk study. No evidence of ischemia. No evidence of previous infarction.  The study is normal.  Echo 01/31/18 Study Conclusions  - Left ventricle: The cavity size was normal. Systolic function was   normal. The estimated ejection fraction was in the range of 55%   to 60%. Wall motion was normal; there were no regional wall   motion abnormalities. The study is not technically sufficient to   allow evaluation of LV diastolic function. - Aortic valve: Transvalvular velocity was within the normal range.   There was no stenosis. There was trivial regurgitation. Valve   area (VTI): 2.3 cm^2. Valve area (Vmax): 2.26 cm^2. Valve area   (Vmean): 2.09 cm^2. - Mitral valve: Transvalvular velocity was within the normal range.   There was no evidence for stenosis. There was trivial   regurgitation. Valve area by continuity equation (using LVOT   flow): 2.49 cm^2. - Right ventricle: The cavity size was normal. Wall thickness was   normal. Systolic function was normal. - Atrial septum: No defect or patent foramen ovale was  identified   by color flow Doppler. - Tricuspid valve: There was mild regurgitation. - Pulmonary arteries: Systolic pressure was within the normal   range. PA peak pressure: 31 mm Hg (S). Past Medical History:  Diagnosis Date  . Allergy    SEASONAL  . Anxiety   . Cancer ([place])    ovarian cancer  . Cataract    BILATERAL  . Dysrhythmia   . Family history of lung cancer   . Family history of prostate cancer   . Family history of prostate cancer    . Family history of uterine cancer   . Fibromyalgia   . GERD (gastroesophageal reflux disease)   . H/O echocardiogram 04/28/08   EF>55% trace mitral regurgitation, No significant valvular pathology  . Hemorrhoids    internal  . History of stress test 02/08/2011   Normal Myocardial perfusion study, this is a low risk scan, No prior study available for comparison  . Hyperlipidemia   . Hypertension    hx of  . Hypothyroidism   . MVP (mitral valve prolapse)    mild and MR  . OA (osteoarthritis)   . Osteoporosis   . Paroxysmal A-fib ([place])   . Sleep apnea    does not use prescribed CPAP  does not tolerate  . Thyroid disease    HYPO  . Varicosities of leg   . Vitamin D deficiency     Past Surgical History:  Procedure Laterality Date  . ABDOMINAL HYSTERECTOMY  1989  . APPENDECTOMY  1989   with hysterectomy  . BREAST EXCISIONAL BIOPSY Left 1995   Benign  . COLONOSCOPY  11-20-2000  . IR IMAGING GUIDED PORT INSERTION  01/17/2018  . NOSE SURGERY  1990    MEDICATIONS: . ALPRAZolam (XANAX) 0.5 MG tablet  . Calcium Carbonate (CALTRATE 600) 1500 MG TABS  . Carboxymethylcellulose Sodium (EYE DROPS OP)  . cholecalciferol (VITAMIN D) 1000 UNITS tablet  . dexamethasone (DECADRON) 4 MG tablet  . fluticasone (FLONASE) 50 MCG/ACT nasal spray  . hydrocortisone (ANUSOL-HC) 25 MG suppository  . ibuprofen (ADVIL,MOTRIN) 200 MG tablet  . lactose free nutrition (BOOST PLUS) LIQD  . levothyroxine (SYNTHROID, LEVOTHROID) 75 MCG tablet  . lidocaine-prilocaine (EMLA) cream  . Magnesium 400 MG TABS  . Menthol-Methyl Salicylate (MUSCLE RUB) 10-15 % CREA  . metoprolol succinate (TOPROL-XL) 50 MG 24 hr tablet  . Multiple Vitamins-Minerals (MULTIVITAMIN WITH MINERALS) tablet  . Omega-3 Fatty Acids (FISH OIL) 1000 MG CAPS  . omeprazole (PRILOSEC) 40 MG capsule  . ondansetron (ZOFRAN) 8 MG tablet  . oxyCODONE (OXY IR/ROXICODONE) 5 MG immediate release tablet  . polyethylene glycol (MIRALAX /  GLYCOLAX) packet  . prochlorperazine (COMPAZINE) 10 MG tablet  . rivaroxaban (XARELTO) 20 MG TABS tablet  . senna-docusate (SENOKOT-S) 8.6-50 MG tablet  . simvastatin (ZOCOR) 20 MG tablet  . vitamin C (ASCORBIC ACID) 500 MG tablet   No current facility-administered medications for this encounter.    Maia Plan [facility] Pre-Surgical Testing [phone] 03/26/18 10:13 AM

## 2018-03-27 ENCOUNTER — Encounter (HOSPITAL_COMMUNITY)
Admission: RE | Disposition: A | Discharge status: Home Health | Payer: Self-pay | Source: Other Acute Inpatient Hospital | Attending: Gynecologic Oncology

## 2018-03-27 ENCOUNTER — Other Ambulatory Visit: Payer: Self-pay

## 2018-03-27 ENCOUNTER — Inpatient Hospital Stay (HOSPITAL_COMMUNITY): Payer: PPO | Admitting: Physician Assistant

## 2018-03-27 ENCOUNTER — Encounter (HOSPITAL_COMMUNITY): Payer: Self-pay | Admitting: Gynecologic Oncology

## 2018-03-27 ENCOUNTER — Inpatient Hospital Stay (HOSPITAL_COMMUNITY): Payer: PPO | Admitting: Anesthesiology

## 2018-03-27 ENCOUNTER — Inpatient Hospital Stay (HOSPITAL_COMMUNITY)
Admission: RE | Admit: 2018-03-27 | Discharge: 2018-04-01 | DRG: 737 | Disposition: A | Discharge status: Home Health | Payer: PPO | Source: Other Acute Inpatient Hospital | Attending: Gynecologic Oncology | Admitting: Gynecologic Oncology

## 2018-03-27 DIAGNOSIS — I959 Hypotension, unspecified: Secondary | ICD-10-CM | POA: Diagnosis present

## 2018-03-27 DIAGNOSIS — Z833 Family history of diabetes mellitus: Secondary | ICD-10-CM | POA: Diagnosis not present

## 2018-03-27 DIAGNOSIS — C569 Malignant neoplasm of unspecified ovary: Secondary | ICD-10-CM | POA: Diagnosis not present

## 2018-03-27 DIAGNOSIS — Z823 Family history of stroke: Secondary | ICD-10-CM

## 2018-03-27 DIAGNOSIS — C786 Secondary malignant neoplasm of retroperitoneum and peritoneum: Secondary | ICD-10-CM | POA: Diagnosis present

## 2018-03-27 DIAGNOSIS — E785 Hyperlipidemia, unspecified: Secondary | ICD-10-CM | POA: Diagnosis not present

## 2018-03-27 DIAGNOSIS — F419 Anxiety disorder, unspecified: Secondary | ICD-10-CM

## 2018-03-27 DIAGNOSIS — Z79891 Long term (current) use of opiate analgesic: Secondary | ICD-10-CM | POA: Diagnosis not present

## 2018-03-27 DIAGNOSIS — C562 Malignant neoplasm of left ovary: Secondary | ICD-10-CM

## 2018-03-27 DIAGNOSIS — C800 Disseminated malignant neoplasm, unspecified: Secondary | ICD-10-CM | POA: Diagnosis present

## 2018-03-27 DIAGNOSIS — E861 Hypovolemia: Secondary | ICD-10-CM

## 2018-03-27 DIAGNOSIS — Z79890 Hormone replacement therapy (postmenopausal): Secondary | ICD-10-CM | POA: Diagnosis not present

## 2018-03-27 DIAGNOSIS — D62 Acute posthemorrhagic anemia: Secondary | ICD-10-CM

## 2018-03-27 DIAGNOSIS — F17210 Nicotine dependence, cigarettes, uncomplicated: Secondary | ICD-10-CM | POA: Diagnosis present

## 2018-03-27 DIAGNOSIS — I480 Paroxysmal atrial fibrillation: Secondary | ICD-10-CM

## 2018-03-27 DIAGNOSIS — Z801 Family history of malignant neoplasm of trachea, bronchus and lung: Secondary | ICD-10-CM

## 2018-03-27 DIAGNOSIS — Z8249 Family history of ischemic heart disease and other diseases of the circulatory system: Secondary | ICD-10-CM

## 2018-03-27 DIAGNOSIS — R112 Nausea with vomiting, unspecified: Secondary | ICD-10-CM | POA: Diagnosis not present

## 2018-03-27 DIAGNOSIS — Z79899 Other long term (current) drug therapy: Secondary | ICD-10-CM | POA: Diagnosis not present

## 2018-03-27 DIAGNOSIS — C561 Malignant neoplasm of right ovary: Secondary | ICD-10-CM | POA: Diagnosis not present

## 2018-03-27 DIAGNOSIS — Z7901 Long term (current) use of anticoagulants: Secondary | ICD-10-CM

## 2018-03-27 DIAGNOSIS — I9589 Other hypotension: Secondary | ICD-10-CM

## 2018-03-27 DIAGNOSIS — E039 Hypothyroidism, unspecified: Secondary | ICD-10-CM

## 2018-03-27 DIAGNOSIS — K219 Gastro-esophageal reflux disease without esophagitis: Secondary | ICD-10-CM | POA: Diagnosis present

## 2018-03-27 DIAGNOSIS — R188 Other ascites: Secondary | ICD-10-CM | POA: Diagnosis not present

## 2018-03-27 DIAGNOSIS — C7982 Secondary malignant neoplasm of genital organs: Secondary | ICD-10-CM | POA: Diagnosis not present

## 2018-03-27 HISTORY — PX: DEBULKING: SHX6277

## 2018-03-27 HISTORY — PX: OMENTECTOMY: SHX5985

## 2018-03-27 HISTORY — PX: BILATERAL SALPINGECTOMY: SHX5743

## 2018-03-27 LAB — TYPE AND SCREEN
ABO/RH(D): O POS
Antibody Screen: NEGATIVE

## 2018-03-27 SURGERY — SALPINGECTOMY, BILATERAL, OPEN
Anesthesia: General

## 2018-03-27 MED ORDER — SODIUM CHLORIDE (PF) 0.9 % IJ SOLN
INTRAMUSCULAR | Status: AC
  Filled 2018-03-27: qty 10

## 2018-03-27 MED ORDER — FENTANYL CITRATE (PF) 100 MCG/2ML IJ SOLN
INTRAMUSCULAR | Status: AC
  Filled 2018-03-27: qty 2

## 2018-03-27 MED ORDER — HYDROMORPHONE HCL 1 MG/ML IJ SOLN
INTRAMUSCULAR | Status: AC
  Filled 2018-03-27: qty 1

## 2018-03-27 MED ORDER — ROCURONIUM BROMIDE 10 MG/ML (PF) SYRINGE
PREFILLED_SYRINGE | INTRAVENOUS | Status: DC | PRN
  Administered 2018-03-27 (×2): 10 mg via INTRAVENOUS
  Administered 2018-03-27: 40 mg via INTRAVENOUS
  Administered 2018-03-27: 10 mg via INTRAVENOUS

## 2018-03-27 MED ORDER — LACTATED RINGERS IV SOLN
INTRAVENOUS | Status: DC
  Administered 2018-03-27 (×2): via INTRAVENOUS

## 2018-03-27 MED ORDER — PROMETHAZINE HCL 25 MG/ML IJ SOLN: 6.2500 mg | INTRAMUSCULAR | Status: DC | PRN

## 2018-03-27 MED ORDER — MIDAZOLAM HCL 2 MG/2ML IJ SOLN
INTRAMUSCULAR | Status: AC
  Filled 2018-03-27: qty 2

## 2018-03-27 MED ORDER — ONDANSETRON HCL 4 MG/2ML IJ SOLN
4.0000 mg | Freq: Four times a day (QID) | INTRAMUSCULAR | Status: DC | PRN
  Administered 2018-03-30 – 2018-04-01 (×2): 4 mg via INTRAVENOUS
  Filled 2018-03-27 (×2): qty 2

## 2018-03-27 MED ORDER — GABAPENTIN 300 MG PO CAPS
300.0000 mg | ORAL_CAPSULE | ORAL | Status: AC
  Administered 2018-03-27: 300 mg via ORAL
  Filled 2018-03-27: qty 1

## 2018-03-27 MED ORDER — LIDOCAINE 2% (20 MG/ML) 5 ML SYRINGE
INTRAMUSCULAR | Status: AC
  Filled 2018-03-27: qty 5

## 2018-03-27 MED ORDER — EPHEDRINE 5 MG/ML INJ
INTRAVENOUS | Status: AC
  Filled 2018-03-27: qty 10

## 2018-03-27 MED ORDER — PROPOFOL 10 MG/ML IV BOLUS
INTRAVENOUS | Status: AC
  Filled 2018-03-27: qty 20

## 2018-03-27 MED ORDER — ACETAMINOPHEN 500 MG PO TABS
1000.0000 mg | ORAL_TABLET | Freq: Four times a day (QID) | ORAL | Status: DC
  Administered 2018-03-27 – 2018-04-01 (×15): 1000 mg via ORAL
  Filled 2018-03-27 (×17): qty 2

## 2018-03-27 MED ORDER — OXYCODONE HCL 5 MG PO TABS
5.0000 mg | ORAL_TABLET | ORAL | Status: DC | PRN
  Administered 2018-03-28 – 2018-03-29 (×3): 5 mg via ORAL
  Administered 2018-04-01: 10 mg via ORAL
  Filled 2018-03-27: qty 1
  Filled 2018-03-27: qty 2
  Filled 2018-03-27 (×2): qty 1

## 2018-03-27 MED ORDER — FENTANYL CITRATE (PF) 250 MCG/5ML IJ SOLN
INTRAMUSCULAR | Status: DC | PRN
  Administered 2018-03-27 (×4): 50 ug via INTRAVENOUS

## 2018-03-27 MED ORDER — DEXAMETHASONE SODIUM PHOSPHATE 10 MG/ML IJ SOLN
INTRAMUSCULAR | Status: AC
  Filled 2018-03-27: qty 1

## 2018-03-27 MED ORDER — KETOROLAC TROMETHAMINE 15 MG/ML IJ SOLN
15.0000 mg | Freq: Four times a day (QID) | INTRAMUSCULAR | Status: DC
  Administered 2018-03-27 – 2018-04-01 (×19): 15 mg via INTRAVENOUS
  Filled 2018-03-27 (×19): qty 1

## 2018-03-27 MED ORDER — DIPHENHYDRAMINE HCL 50 MG/ML IJ SOLN: 12.5000 mg | Freq: Four times a day (QID) | INTRAMUSCULAR | Status: DC | PRN

## 2018-03-27 MED ORDER — DIPHENHYDRAMINE HCL 12.5 MG/5ML PO ELIX: 12.5000 mg | ORAL_SOLUTION | Freq: Four times a day (QID) | ORAL | Status: DC | PRN

## 2018-03-27 MED ORDER — SCOPOLAMINE 1 MG/3DAYS TD PT72
1.0000 | MEDICATED_PATCH | TRANSDERMAL | Status: AC
  Administered 2018-03-27: 1.5 mg via TRANSDERMAL
  Filled 2018-03-27: qty 1

## 2018-03-27 MED ORDER — KCL IN DEXTROSE-NACL 20-5-0.45 MEQ/L-%-% IV SOLN
INTRAVENOUS | Status: DC
  Administered 2018-03-27 – 2018-03-28 (×2): via INTRAVENOUS
  Filled 2018-03-27 (×2): qty 1000

## 2018-03-27 MED ORDER — BUPIVACAINE HCL (PF) 0.25 % IJ SOLN
INTRAMUSCULAR | Status: AC
  Filled 2018-03-27: qty 30

## 2018-03-27 MED ORDER — ENOXAPARIN SODIUM 40 MG/0.4ML ~~LOC~~ SOLN
40.0000 mg | SUBCUTANEOUS | Status: AC
  Administered 2018-03-27: 40 mg via SUBCUTANEOUS
  Filled 2018-03-27: qty 0.4

## 2018-03-27 MED ORDER — EPHEDRINE SULFATE 50 MG/ML IJ SOLN
INTRAMUSCULAR | Status: DC | PRN
  Administered 2018-03-27: 5 mg via INTRAVENOUS

## 2018-03-27 MED ORDER — SUCCINYLCHOLINE CHLORIDE 200 MG/10ML IV SOSY
PREFILLED_SYRINGE | INTRAVENOUS | Status: DC | PRN
  Administered 2018-03-27: 100 mg via INTRAVENOUS

## 2018-03-27 MED ORDER — SUCCINYLCHOLINE CHLORIDE 200 MG/10ML IV SOSY
PREFILLED_SYRINGE | INTRAVENOUS | Status: AC
  Filled 2018-03-27: qty 10

## 2018-03-27 MED ORDER — 0.9 % SODIUM CHLORIDE (POUR BTL) OPTIME
TOPICAL | Status: DC | PRN
  Administered 2018-03-27: 1000 mL

## 2018-03-27 MED ORDER — SUGAMMADEX SODIUM 200 MG/2ML IV SOLN
INTRAVENOUS | Status: AC
  Filled 2018-03-27: qty 2

## 2018-03-27 MED ORDER — PHENYLEPHRINE 40 MCG/ML (10ML) SYRINGE FOR IV PUSH (FOR BLOOD PRESSURE SUPPORT)
PREFILLED_SYRINGE | INTRAVENOUS | Status: AC
  Filled 2018-03-27: qty 10

## 2018-03-27 MED ORDER — ENOXAPARIN SODIUM 40 MG/0.4ML ~~LOC~~ SOLN
40.0000 mg | SUBCUTANEOUS | Status: DC
  Administered 2018-03-28 – 2018-04-01 (×5): 40 mg via SUBCUTANEOUS
  Filled 2018-03-27 (×6): qty 0.4

## 2018-03-27 MED ORDER — ONDANSETRON HCL 4 MG/2ML IJ SOLN
INTRAMUSCULAR | Status: DC | PRN
  Administered 2018-03-27: 4 mg via INTRAVENOUS

## 2018-03-27 MED ORDER — SUGAMMADEX SODIUM 200 MG/2ML IV SOLN
INTRAVENOUS | Status: DC | PRN
  Administered 2018-03-27: 120 mg via INTRAVENOUS

## 2018-03-27 MED ORDER — ONDANSETRON HCL 4 MG PO TABS
4.0000 mg | ORAL_TABLET | Freq: Four times a day (QID) | ORAL | Status: DC | PRN
  Administered 2018-03-29: 4 mg via ORAL
  Filled 2018-03-27: qty 1

## 2018-03-27 MED ORDER — ONDANSETRON HCL 4 MG/2ML IJ SOLN
INTRAMUSCULAR | Status: AC
  Filled 2018-03-27: qty 2

## 2018-03-27 MED ORDER — GABAPENTIN 300 MG PO CAPS
300.0000 mg | ORAL_CAPSULE | Freq: Two times a day (BID) | ORAL | Status: DC
  Administered 2018-03-27 – 2018-04-01 (×9): 300 mg via ORAL
  Filled 2018-03-27 (×9): qty 1

## 2018-03-27 MED ORDER — ENSURE SURGERY PO LIQD
237.0000 mL | Freq: Two times a day (BID) | ORAL | Status: DC
  Administered 2018-03-28 – 2018-04-01 (×6): 237 mL via ORAL
  Filled 2018-03-27 (×10): qty 237

## 2018-03-27 MED ORDER — ACETAMINOPHEN 500 MG PO TABS
1000.0000 mg | ORAL_TABLET | ORAL | Status: DC
  Filled 2018-03-27: qty 2

## 2018-03-27 MED ORDER — ACETAMINOPHEN 500 MG PO TABS
1000.0000 mg | ORAL_TABLET | Freq: Once | ORAL | Status: AC
  Administered 2018-03-27: 1000 mg via ORAL

## 2018-03-27 MED ORDER — HYDROMORPHONE HCL 1 MG/ML IJ SOLN
0.2500 mg | INTRAMUSCULAR | Status: DC | PRN
  Administered 2018-03-27 (×2): 0.5 mg via INTRAVENOUS

## 2018-03-27 MED ORDER — PROPOFOL 10 MG/ML IV BOLUS
INTRAVENOUS | Status: DC | PRN
  Administered 2018-03-27: 110 mg via INTRAVENOUS

## 2018-03-27 MED ORDER — SODIUM CHLORIDE (PF) 0.9 % IJ SOLN
INTRAMUSCULAR | Status: DC | PRN
  Administered 2018-03-27: 10 mL

## 2018-03-27 MED ORDER — MIDAZOLAM HCL 2 MG/2ML IJ SOLN
INTRAMUSCULAR | Status: DC | PRN
  Administered 2018-03-27: 1 mg via INTRAVENOUS

## 2018-03-27 MED ORDER — ROCURONIUM BROMIDE 10 MG/ML (PF) SYRINGE
PREFILLED_SYRINGE | INTRAVENOUS | Status: AC
  Filled 2018-03-27: qty 10

## 2018-03-27 MED ORDER — DEXAMETHASONE SODIUM PHOSPHATE 4 MG/ML IJ SOLN: 4.0000 mg | INTRAMUSCULAR | Status: DC

## 2018-03-27 MED ORDER — SCOPOLAMINE 1 MG/3DAYS TD PT72: 1.0000 | MEDICATED_PATCH | TRANSDERMAL | Status: DC

## 2018-03-27 MED ORDER — PHENYLEPHRINE 40 MCG/ML (10ML) SYRINGE FOR IV PUSH (FOR BLOOD PRESSURE SUPPORT)
PREFILLED_SYRINGE | INTRAVENOUS | Status: DC | PRN
  Administered 2018-03-27: 120 ug via INTRAVENOUS

## 2018-03-27 MED ORDER — HYDROMORPHONE HCL 1 MG/ML IJ SOLN
0.5000 mg | INTRAMUSCULAR | Status: DC | PRN
  Administered 2018-03-27: 0.5 mg via INTRAVENOUS
  Filled 2018-03-27: qty 0.5

## 2018-03-27 MED ORDER — BUPIVACAINE LIPOSOME 1.3 % IJ SUSP
20.0000 mL | Freq: Once | INTRAMUSCULAR | Status: AC
  Administered 2018-03-27: 20 mL
  Filled 2018-03-27: qty 20

## 2018-03-27 MED ORDER — SODIUM CHLORIDE (PF) 0.9 % IJ SOLN
INTRAMUSCULAR | Status: AC
  Filled 2018-03-27: qty 50

## 2018-03-27 MED ORDER — LIDOCAINE 2% (20 MG/ML) 5 ML SYRINGE
INTRAMUSCULAR | Status: DC | PRN
  Administered 2018-03-27: 50 mg via INTRAVENOUS

## 2018-03-27 MED ORDER — FENTANYL CITRATE (PF) 100 MCG/2ML IJ SOLN
25.0000 ug | INTRAMUSCULAR | Status: DC | PRN
  Administered 2018-03-27 (×3): 50 ug via INTRAVENOUS

## 2018-03-27 MED ORDER — SODIUM CHLORIDE 0.9 % IV SOLN
INTRAVENOUS | Status: AC
  Filled 2018-03-27: qty 2

## 2018-03-27 MED ORDER — SODIUM CHLORIDE 0.9 % IV SOLN
2.0000 g | INTRAVENOUS | Status: AC
  Administered 2018-03-27: 2 g via INTRAVENOUS
  Filled 2018-03-27: qty 2

## 2018-03-27 MED ORDER — DEXAMETHASONE SODIUM PHOSPHATE 10 MG/ML IJ SOLN
INTRAMUSCULAR | Status: DC | PRN
  Administered 2018-03-27: 4 mg via INTRAVENOUS

## 2018-03-27 MED ORDER — BUPIVACAINE HCL (PF) 0.25 % IJ SOLN
INTRAMUSCULAR | Status: DC | PRN
  Administered 2018-03-27: 20 mL

## 2018-03-27 SURGICAL SUPPLY — 66 items
ADH SKN CLS APL DERMABOND .7 (GAUZE/BANDAGES/DRESSINGS) ×3
AGENT HMST KT MTR STRL THRMB (HEMOSTASIS) ×1
ATTRACTOMAT 16X20 MAGNETIC DRP (DRAPES) ×2 IMPLANT
BLADE EXTENDED COATED 6.5IN (ELECTRODE) ×3 IMPLANT
CELLS DAT CNTRL 66122 CELL SVR (MISCELLANEOUS) IMPLANT
CHLORAPREP W/TINT 26ML (MISCELLANEOUS) ×3 IMPLANT
CLIP VESOCCLUDE LG 6/CT (CLIP) ×3 IMPLANT
CLIP VESOCCLUDE MED 6/CT (CLIP) ×5 IMPLANT
CLIP VESOCCLUDE MED LG 6/CT (CLIP) ×3 IMPLANT
CONT SPEC 4OZ CLIKSEAL STRL BL (MISCELLANEOUS) IMPLANT
COVER WAND RF STERILE (DRAPES) IMPLANT
DERMABOND ADVANCED (GAUZE/BANDAGES/DRESSINGS) ×6
DERMABOND ADVANCED .7 DNX12 (GAUZE/BANDAGES/DRESSINGS) IMPLANT
DRAPE INCISE IOBAN 66X45 STRL (DRAPES) ×2 IMPLANT
DRAPE WARM FLUID 44X44 (DRAPE) ×3 IMPLANT
DRSG OPSITE POSTOP 4X10 (GAUZE/BANDAGES/DRESSINGS) ×2 IMPLANT
DRSG OPSITE POSTOP 4X6 (GAUZE/BANDAGES/DRESSINGS) IMPLANT
DRSG OPSITE POSTOP 4X8 (GAUZE/BANDAGES/DRESSINGS) IMPLANT
ELECT REM PT RETURN 15FT ADLT (MISCELLANEOUS) ×3 IMPLANT
GAUZE 4X4 16PLY RFD (DISPOSABLE) IMPLANT
GLOVE BIO SURGEON STRL SZ 6 (GLOVE) ×6 IMPLANT
GLOVE BIO SURGEON STRL SZ 6.5 (GLOVE) ×4 IMPLANT
GLOVE BIO SURGEONS STRL SZ 6.5 (GLOVE) ×2
GOWN STRL REUS W/ TWL LRG LVL3 (GOWN DISPOSABLE) ×2 IMPLANT
GOWN STRL REUS W/TWL LRG LVL3 (GOWN DISPOSABLE) ×6
HEMOSTAT ARISTA ABSORB 3G PWDR (HEMOSTASIS) IMPLANT
HOLDER FOLEY CATH W/STRAP (MISCELLANEOUS) ×2 IMPLANT
KIT BASIN OR (CUSTOM PROCEDURE TRAY) ×3 IMPLANT
KIT TURNOVER KIT A (KITS) IMPLANT
LIGASURE IMPACT 36 18CM CVD LR (INSTRUMENTS) ×2 IMPLANT
LOOP VESSEL MAXI BLUE (MISCELLANEOUS) ×2 IMPLANT
NEEDLE HYPO 22GX1.5 SAFETY (NEEDLE) ×6 IMPLANT
NS IRRIG 1000ML POUR BTL (IV SOLUTION) ×4 IMPLANT
PACK GENERAL/GYN (CUSTOM PROCEDURE TRAY) ×3 IMPLANT
RELOAD PROXIMATE 75MM BLUE (ENDOMECHANICALS) IMPLANT
RELOAD PROXIMATE TA60MM BLUE (ENDOMECHANICALS) IMPLANT
RELOAD STAPLE 60 BLU REG PROX (ENDOMECHANICALS) IMPLANT
RELOAD STAPLE 75 3.8 BLU REG (ENDOMECHANICALS) IMPLANT
RETRACTOR WND ALEXIS 18 MED (MISCELLANEOUS) IMPLANT
RETRACTOR WND ALEXIS 25 LRG (MISCELLANEOUS) IMPLANT
RTRCTR WOUND ALEXIS 18CM MED (MISCELLANEOUS)
RTRCTR WOUND ALEXIS 25CM LRG (MISCELLANEOUS)
SHEET LAVH (DRAPES) ×3 IMPLANT
SPONGE LAP 18X18 RF (DISPOSABLE) ×4 IMPLANT
STAPLER GUN LINEAR PROX 60 (STAPLE) IMPLANT
STAPLER PROXIMATE 75MM BLUE (STAPLE) IMPLANT
STAPLER VISISTAT 35W (STAPLE) IMPLANT
SURGIFLO W/THROMBIN 8M KIT (HEMOSTASIS) ×2 IMPLANT
SUT MNCRL AB 4-0 PS2 18 (SUTURE) ×6 IMPLANT
SUT PDS AB 1 TP1 96 (SUTURE) ×6 IMPLANT
SUT SILK 3 0 SH CR/8 (SUTURE) IMPLANT
SUT VIC AB 0 CT1 36 (SUTURE) ×4 IMPLANT
SUT VIC AB 2-0 CT1 36 (SUTURE) ×6 IMPLANT
SUT VIC AB 2-0 CT2 27 (SUTURE) ×12 IMPLANT
SUT VIC AB 2-0 SH 27 (SUTURE)
SUT VIC AB 2-0 SH 27X BRD (SUTURE) IMPLANT
SUT VIC AB 3-0 CTX 36 (SUTURE) IMPLANT
SUT VIC AB 3-0 SH 18 (SUTURE) IMPLANT
SUT VIC AB 3-0 SH 27 (SUTURE) ×6
SUT VIC AB 3-0 SH 27X BRD (SUTURE) ×1 IMPLANT
SUT VIC AB 3-0 SH 27XBRD (SUTURE) IMPLANT
SYR 30ML LL (SYRINGE) ×6 IMPLANT
TOWEL OR 17X26 10 PK STRL BLUE (TOWEL DISPOSABLE) ×3 IMPLANT
TOWEL OR NON WOVEN STRL DISP B (DISPOSABLE) ×3 IMPLANT
TRAY FOLEY MTR SLVR 16FR STAT (SET/KITS/TRAYS/PACK) ×3 IMPLANT
UNDERPAD 30X30 (UNDERPADS AND DIAPERS) ×3 IMPLANT

## 2018-03-27 NOTE — Anesthesia Procedure Notes (Signed)
Procedure Name: Intubation Date/Time: 03/27/2018 12:15 PM Performed by: Niel Hummer, CRNA Pre-anesthesia Checklist: Patient being monitored, Suction available, Emergency Drugs available and Patient identified Patient Re-evaluated:Patient Re-evaluated prior to induction Oxygen Delivery Method: Circle system utilized Preoxygenation: Pre-oxygenation with 100% oxygen Induction Type: IV induction Ventilation: Mask ventilation without difficulty Laryngoscope Size: Mac and 4 Grade View: Grade I Tube type: Oral Number of attempts: 1 Airway Equipment and Method: Stylet Placement Confirmation: ETT inserted through vocal cords under direct vision,  positive ETCO2 and breath sounds checked- equal and bilateral Secured at: 22 cm Tube secured with: Tape Dental Injury: Teeth and Oropharynx as per pre-operative assessment

## 2018-03-27 NOTE — Transfer of Care (Signed)
Immediate Anesthesia Transfer of Care Note  Patient: Haley [name]  Procedure(s) Performed: EXPLORATORY LAPARATOMY, BILATERAL SALPINGOOPHERECTOMY (N/A ) OMENTECTOMY (N/A ) RADICAL TUMOR DEBULKING (N/A )  Patient Location: PACU  Anesthesia Type:General  Level of Consciousness: awake, alert  and oriented  Airway & Oxygen Therapy: Patient Spontanous Breathing and Patient connected to face mask oxygen  Post-op Assessment: Report given to RN and Post -op Vital signs reviewed and stable  Post vital signs: Reviewed and stable  Last Vitals:  Vitals Value Taken Time  BP 125/76 03/27/2018  3:37 PM  Temp    Pulse 57 03/27/2018  3:39 PM  Resp 11 03/27/2018  3:39 PM  SpO2 100 % 03/27/2018  3:39 PM  Vitals shown include unvalidated device data.  Last Pain:  Vitals:   03/27/18 1124  TempSrc:   PainSc: 0-No pain         Complications: No apparent anesthesia complications

## 2018-03-27 NOTE — Interval H&P Note (Signed)
History and Physical Interval Note:  03/27/2018 10:53 AM  Haley [name]  has presented today for surgery, with the diagnosis of OVARIAN CANCER.  The various methods of treatment have been discussed with the patient and family. After consideration of risks, benefits and other options for treatment, the patient has consented to  Procedure(s): OPEN BILATERAL SALPINGOOOPHORECTOMY, POSSIBLE BOWEL RESECTION (N/A) OMENTECTOMY (N/A) RADICAL TUMOR DEBULKING (N/A) as a surgical intervention.  The patient's history has been reviewed, patient examined, no change in status, stable for surgery.  I have reviewed the patient's chart and labs.  Questions were answered to the patient's satisfaction.     Thereasa Solo

## 2018-03-27 NOTE — Anesthesia Postprocedure Evaluation (Signed)
Anesthesia Post Note  Patient: [name]  Procedure(s) Performed: EXPLORATORY LAPARATOMY, BILATERAL SALPINGOOPHERECTOMY (N/A ) OMENTECTOMY (N/A ) RADICAL TUMOR DEBULKING (N/A )     Patient location during evaluation: PACU Anesthesia Type: General Level of consciousness: awake and alert and awake Pain management: pain level controlled Vital Signs Assessment: post-procedure vital signs reviewed and stable Respiratory status: spontaneous breathing, nonlabored ventilation, respiratory function stable and patient connected to nasal cannula oxygen Cardiovascular status: blood pressure returned to baseline and stable Postop Assessment: no apparent nausea or vomiting Anesthetic complications: no    Last Vitals:  Vitals:   03/27/18 1822 03/27/18 1943  BP: (!) 156/77 (!) 161/85  Pulse: (!) 51 60  Resp: 14 16  Temp: 36.6 C 36.6 C  SpO2: 97% 98%    Last Pain:  Vitals:   03/27/18 2051  TempSrc:   PainSc: 2                  Catalina Gravel

## 2018-03-27 NOTE — Op Note (Signed)
OPERATIVE NOTE 03/27/18   Preoperative Diagnosis: ovarian cancer, stage IIIC, metastatic to omentum, peritoneum, serosa of intestines   Postoperative Diagnosis:same    Procedure(s) Performed: 1. Exploratory laparotomy with bilateral salpingo-oophorectomy, omentectomy radical tumor debulking for ovarian cancer .  Surgeon: Thereasa Solo, MD.  Assistant Surgeon: Dr Nancy Marus, M.D. Assistant: (an MD assistant was necessary for tissue manipulation, retraction and positioning due to the complexity of the case and hospital policies).   Specimens: Bilateral tubes / ovaries, omentum. Peritoneal nodules, rectosigmoid nodules.    Estimated Blood Loss: 250 mL.    IVF: 1100cc  Urine [id number]  Complications: None.   Operative Findings: omental cake, miliary studding on diaphragm and bilateral paracolic gutters. Sigmoid colon densely adherent to left and right ovaries with tumor rind, ureters mildly dilated bilaterally due to retroperitoneal extension of the tumor towards ureters causing compression.    This represented an optimal cytoreduction (R1) with gross residual disease on the diaphragm that had been ablated and a thin tumor plaque on the sigmoid colon that was ablated. No residual disease >1cm remaining.   Procedure:   The patient was seen in the Holding Room. The risks, benefits, complications, treatment options, and expected outcomes were discussed with the patient.  The patient concurred with the proposed plan, giving informed consent.   The patient was  identified as [facility]  and the procedure verified as BSO, omentectomy, tumor debulking. A Time Out was held and the above information confirmed upon entry to the operating room..  After induction of anesthesia, the patient was draped and prepped in the usual sterile manner.  She was prepped and draped in the normal sterile fashion in the dorsal lithotomy position in padded Allen stirrups with good attention paid to support of the  lower back and lower extremities. Position was adjusted for appropriate support. A Foley catheter was placed to gravity.   A midline vertical incision was made and carried through the subcutaneous tissue to the fascia. The fascial incision was made and extended superiorally. The rectus muscles were separated. The peritoneum was identified and entered. Peritoneal incision was extended longitudinally.  The abdominal cavity was entered sharply and without incident. A Bookwalter retractor was then placed. A survey of the abdomen and pelvis revealed the above findings, which were significant for low volume ascites, omental caking, tumor studding of right diaphragm, nodules in paracolic gutters, sigmoid colon draped over 4cm ovarian cysts that were retroperitoneally infiltrating.  The omental cake was dissected free from the transverse colon from the hepatic flexure to the splenic flexure using sharp metzenbaum scissor dissection. The lesser sac was entered. The tumor cake was separated from the mesentery of the transverse colon. The short gastric vessels were sealed with ligasure and the infragastric omentum was separated from the greater curvature of the stomach removing all bulky tumor. Hemostasis was confirmed. The colon was closely inspected and was noted to be intact and hemostatic.   After packing the small bowel into the upper abdomen, we performed the left salpingo-oophorectomy by entering the  pelvic sidewall just posterior to the residual left round ligament. The pararectal space was developed and the retroperitoneum developed up to the level of the common iliac artery.  The course of the ureter was identified with ease. Given that it was adherent to the mass the ureter was skeletonized and a vessel loop was placed around it. The left IP was then skeletonized, and sealed and transected with the ligasure. The ovary was separated from its peritoneal attachments  with the bovie with visualization of the  ureter at all times. There was careful dissection of the ovary from the bladder and the rectum. A thin rind of tumor remained on the anterior rectal wall. This was ablated with the argon beam coagulator. The ovary was separated from the vaginal cuff/cardinal ligament attatchments using the ligasure.   The terminal ileum was dissected from its dense tumor attachments to the right ovary using sharp dissection. The right retroperitoneal peritoneum was entered parallel to the cecum attachments and the right ureter was identified in the right retroperitoneal space. Using sharp and monopolar dissection, the right tube and ovary were freed from their peritoneal adhesions to the pelvis and sigmoid colon. The IP ligament was sealed with ligasure and transected. The vaginal cuff/cardinal ligament attachments were taken down with ligasure.   The peritoneal cavity was irrigated and hemostasis was confirmed at all surgical sites. This was reinforced with clips and surgiflow.  The argon beam was used to ablate any residual macroscopic tumor on the diaphragm and rectum and peritoneum.  There was a loop of terminal ileum that had been adherent to the right tumor mass. This was carefully inspected and scissors were used to free the hairpin loops.   Residual tumor was present at plaques on the anterior rectal wall (ablated with ABC) and diaphragm (Right) which was also ablated.   The fascia was reapproximated with 0 looped PDS using a total of two sutures. The subcutaneous layer was then irrigated copiously.  Exparel long acting local anesthetic was infiltrated into the subcutaneous tissues. The skin was closed with subcuticular suture. The patient tolerated the procedure well.   Sponge, lap and needle counts were correct x 2.

## 2018-03-28 ENCOUNTER — Other Ambulatory Visit: Payer: Self-pay

## 2018-03-28 ENCOUNTER — Encounter: Payer: Self-pay | Admitting: Oncology

## 2018-03-28 ENCOUNTER — Encounter (HOSPITAL_COMMUNITY): Payer: Self-pay | Admitting: Gynecologic Oncology

## 2018-03-28 LAB — CBC
HCT: 32.8 % — ABNORMAL LOW (ref 36.0–46.0)
Hemoglobin: 10.3 g/dL — ABNORMAL LOW (ref 12.0–15.0)
MCH: 29.4 pg (ref 26.0–34.0)
MCHC: 31.4 g/dL (ref 30.0–36.0)
MCV: 93.7 fL (ref 80.0–100.0)
NRBC: 0 % (ref 0.0–0.2)
Platelets: 180 10*3/uL (ref 150–400)
RBC: 3.5 MIL/uL — ABNORMAL LOW (ref 3.87–5.11)
RDW: 17.5 % — AB (ref 11.5–15.5)
WBC: 11.2 10*3/uL — ABNORMAL HIGH (ref 4.0–10.5)

## 2018-03-28 LAB — HEMOGLOBIN AND HEMATOCRIT, BLOOD
HCT: 29.9 % — ABNORMAL LOW (ref 36.0–46.0)
Hemoglobin: 9.3 g/dL — ABNORMAL LOW (ref 12.0–15.0)

## 2018-03-28 LAB — BASIC METABOLIC PANEL
Anion gap: 7 (ref 5–15)
BUN: 10 mg/dL (ref 8–23)
CO2: 24 mmol/L (ref 22–32)
Calcium: 8.3 mg/dL — ABNORMAL LOW (ref 8.9–10.3)
Chloride: 105 mmol/L (ref 98–111)
Creatinine, Ser: 0.56 mg/dL (ref 0.44–1.00)
GFR calc Af Amer: >60 mL/min (ref >60)
GFR calc non Af Amer: >60 mL/min (ref >60)
Glucose, Bld: 159 mg/dL — ABNORMAL HIGH (ref 70–99)
Potassium: 3.9 mmol/L (ref 3.5–5.1)
Sodium: 136 mmol/L (ref 135–145)

## 2018-03-28 MED ORDER — SIMVASTATIN 20 MG PO TABS
20.0000 mg | ORAL_TABLET | Freq: Every day | ORAL | Status: DC
  Administered 2018-03-28 – 2018-03-31 (×4): 20 mg via ORAL
  Filled 2018-03-28 (×4): qty 1

## 2018-03-28 MED ORDER — ENOXAPARIN (LOVENOX) PATIENT EDUCATION KIT
PACK | Freq: Once | Status: AC
  Administered 2018-03-28: 16:00:00

## 2018-03-28 MED ORDER — METOPROLOL SUCCINATE ER 50 MG PO TB24
50.0000 mg | ORAL_TABLET | Freq: Every day | ORAL | Status: DC
  Administered 2018-03-29 – 2018-04-01 (×3): 50 mg via ORAL
  Filled 2018-03-28 (×5): qty 1

## 2018-03-28 MED ORDER — ALUM & MAG HYDROXIDE-SIMETH 200-200-20 MG/5ML PO SUSP
30.0000 mL | Freq: Four times a day (QID) | ORAL | Status: DC | PRN
  Administered 2018-03-28 – 2018-04-01 (×6): 30 mL via ORAL
  Filled 2018-03-28 (×6): qty 30

## 2018-03-28 MED ORDER — ALPRAZOLAM 0.25 MG PO TABS
0.2500 mg | ORAL_TABLET | Freq: Every day | ORAL | Status: DC | PRN
  Administered 2018-03-28: 0.25 mg via ORAL
  Filled 2018-03-28: qty 1

## 2018-03-28 MED ORDER — LEVOTHYROXINE SODIUM 75 MCG PO TABS
75.0000 ug | ORAL_TABLET | Freq: Every day | ORAL | Status: DC
  Administered 2018-03-29 – 2018-04-01 (×4): 75 ug via ORAL
  Filled 2018-03-28 (×5): qty 1

## 2018-03-28 MED ORDER — KCL IN DEXTROSE-NACL 20-5-0.45 MEQ/L-%-% IV SOLN
INTRAVENOUS | Status: DC
  Administered 2018-03-29: 06:00:00 via INTRAVENOUS
  Filled 2018-03-28 (×2): qty 1000

## 2018-03-28 MED ORDER — SODIUM CHLORIDE 0.9 % IV BOLUS
500.0000 mL | Freq: Once | INTRAVENOUS | Status: AC
  Administered 2018-03-28: 500 mL via INTRAVENOUS

## 2018-03-28 MED ORDER — PANTOPRAZOLE SODIUM 40 MG PO TBEC
40.0000 mg | DELAYED_RELEASE_TABLET | Freq: Every day | ORAL | Status: DC
  Administered 2018-03-29 – 2018-04-01 (×3): 40 mg via ORAL
  Filled 2018-03-28 (×3): qty 1

## 2018-03-28 NOTE — Progress Notes (Signed)
1 Day Post-Op Procedure(s) (LRB): EXPLORATORY LAPARATOMY, BILATERAL SALPINGOOPHERECTOMY (N/A) OMENTECTOMY (N/A) RADICAL TUMOR DEBULKING (N/A)  Subjective: Patient reports lower abdominal discomfort.  Tolerating diet with no nausea or emesis.  Due to void since foley removal.  Has not ambulated since surgery.  States her abdomen feels less distended today compared to pre-op.  Denies chest pain, dyspnea, passing flatus. No concerns voiced.  Objective: Vital signs in last 24 hours: Temp:  [97.5 F (36.4 C)-98.3 F (36.8 C)] 98.3 F (36.8 C) (03/18 0916) Pulse Rate:  [51-75] 61 (03/18 0916) Resp:  [9-19] 16 (03/18 0916) BP: (115-164)/(65-90) 123/71 (03/18 0916) SpO2:  [95 %-100 %] 99 % (03/18 0916) Weight:  [131 lb 9.6 oz (59.7 kg)-139 lb 6.4 oz (63.2 kg)] 139 lb 6.4 oz (63.2 kg) (03/18 0505) Last BM Date: 03/26/18  Intake/Output from previous day: 03/17 0701 - 03/18 0700 In: 2799.5 [P.O.:120; I.V.:2579.5; IV Piggyback:100] Out: 1200 [Urine:850; Blood:350]  Physical Examination: General: alert, cooperative and no distress Resp: clear to auscultation bilaterally Cardio: regular rate and rhythm, S1, S2 normal, no murmur, click, rub or gallop GI: soft, non-tender; bowel sounds normal; no masses,  no organomegaly and incision: midline incision in place with op site dressing, no drainage or bleeding noted underneath Extremities: extremities normal, atraumatic, no cyanosis or edema  Labs: WBC/Hgb/Hct/Plts:  11.2/10.3/32.8/180 (03/18 0401) BUN/Cr/glu/ALT/AST/amyl/lip:  10/0.56/--/--/--/--/-- (03/18 0401)  Assessment: 73 y.o. s/p Procedure(s): EXPLORATORY LAPARATOMY, BILATERAL SALPINGOOPHERECTOMY OMENTECTOMY RADICAL TUMOR DEBULKING: stable Pain:  Pain is well-controlled on PRN medications.  Heme: Hgb 10.3 and Hct 32.8 this am. Appropriate when compared to pre-op values and surgical losses (EBL 250 cc).  CV: BP and HR stable.  Continue to monitor with ordered vital signs.  GI:   Tolerating po: Yes. Antiemetics ordered if needed.   GU: Due to void since foley removal.  850 cc urine output recorded from 3/17-3/18/2020.    FEN: No critical labs this am. Continue to monitor with daily labs.  Prophylaxis: Lovenox and SCDs. Plan for lovenox for one week at discharge then transition back to [place].  Plan: Kpad for symptom relief Saline lock IV Home meds reordered Lovenox teaching kit for home use for one week of lovenox at discharge Encourage ambulation, IS use, deep breathing, and coughing Continue plan of care per Dr. Denman George   LOS: 1 day    Edgerrin Correia D Zamirah Denny 03/28/2018, 9:44 AM

## 2018-03-28 NOTE — Plan of Care (Signed)
Patient lying in bed this morning; no complaints of pain currently. Foley removed [id number]; patient due to void but has no urge or need currently. Will continue to monitor.

## 2018-03-28 NOTE — Progress Notes (Signed)
Foley removed 0645 this morning. No episode voiding at this time; states she has no urge to urinate at this time. Has been up and ambulating in the hall, and ambulated to the bathroom to try to void but was unsuccessful. Bladder scan resulted 96cc urine. Blood pressure has remained on the low side: 91/53 and 94/54. Spoke with Haley John, NP. States she will put in bolus of IVF. Requests we monitor UOP and BP post bolus; bladder scan if necessary and follow-up with her again as necessary. Will continue to monitor.

## 2018-03-29 LAB — CBC
HCT: 30.6 % — ABNORMAL LOW (ref 36.0–46.0)
Hemoglobin: 9.2 g/dL — ABNORMAL LOW (ref 12.0–15.0)
MCH: 29 pg (ref 26.0–34.0)
MCHC: 30.1 g/dL (ref 30.0–36.0)
MCV: 96.5 fL (ref 80.0–100.0)
Platelets: 161 10*3/uL (ref 150–400)
RBC: 3.17 MIL/uL — ABNORMAL LOW (ref 3.87–5.11)
RDW: 17.5 % — ABNORMAL HIGH (ref 11.5–15.5)
WBC: 10.3 10*3/uL (ref 4.0–10.5)
nRBC: 0 % (ref 0.0–0.2)

## 2018-03-29 LAB — BASIC METABOLIC PANEL
Anion gap: 5 (ref 5–15)
BUN: 12 mg/dL (ref 8–23)
CHLORIDE: 104 mmol/L (ref 98–111)
CO2: 26 mmol/L (ref 22–32)
Calcium: 8.7 mg/dL — ABNORMAL LOW (ref 8.9–10.3)
Creatinine, Ser: 0.78 mg/dL (ref 0.44–1.00)
GFR calc Af Amer: >60 mL/min (ref >60)
GFR calc non Af Amer: >60 mL/min (ref >60)
Glucose, Bld: 115 mg/dL — ABNORMAL HIGH (ref 70–99)
Potassium: 3.8 mmol/L (ref 3.5–5.1)
Sodium: 135 mmol/L (ref 135–145)

## 2018-03-29 MED ORDER — SODIUM CHLORIDE 0.9 % IV SOLN
INTRAVENOUS | Status: DC
  Administered 2018-03-29: 18:00:00 via INTRAVENOUS

## 2018-03-29 MED ORDER — SODIUM CHLORIDE 0.9% FLUSH
10.0000 mL | INTRAVENOUS | Status: DC | PRN
  Administered 2018-03-30: 20 mL
  Administered 2018-04-01: 10 mL
  Filled 2018-03-29 (×3): qty 40

## 2018-03-29 MED ORDER — SODIUM CHLORIDE 0.9% FLUSH
10.0000 mL | Freq: Two times a day (BID) | INTRAVENOUS | Status: DC
  Administered 2018-03-29 – 2018-03-31 (×5): 10 mL

## 2018-03-29 NOTE — Progress Notes (Signed)
Newly placed PIV infiltrated per primary care RN. Writer arrived to 1529 to place PIV per consult.  Patient requests port be accessed. Right IJ single lumen power port placed @ [facility] on 01/17/2018. Port accessed using power 20 gauge 1 inch huber needle. + flush without resistance, + blood return noted. Antimicrobial disc placed and site cover with transparent dressing. Routine line care orders placed per protocol. Patient tolerated well.

## 2018-03-29 NOTE — Care Management (Signed)
30 Day Unplanned Readmission Risk Score     Admission (Current) from 03/27/2018 in [place]  30 Day Unplanned Readmission Risk Score (%)  28 Filed at 03/29/2018 0801     This score is the patient's risk of an unplanned readmission within 30 days of being discharged (0 -100%). The score is based on dignosis, age, lab data, medications, orders, and past utilization.   Low:  0-14.9   Medium: 15-21.9   High: 22-29.9   Extreme: 30 and above        Readmission Risk Prevention Plan 03/29/2018  Transportation Screening Complete  PCP or Specialist Appt within 3-5 Days Not Complete  HRI or [place] Not Complete  HRI or Home Care Consult comments NA  Social Work Consult for [place] Planning/Counseling Not Complete  SW consult not completed comments NA  Palliative Care Screening Not Complete  Palliative Care Screening Not Complete Comments NA  Medication Review Press photographer) Complete  Some recent data might be hidden

## 2018-03-29 NOTE — Progress Notes (Addendum)
2 Days Post-Op Procedure(s) (LRB): EXPLORATORY LAPARATOMY, BILATERAL SALPINGOOPHERECTOMY (N/A) OMENTECTOMY (N/A) RADICAL TUMOR DEBULKING (N/A)  Subjective: Patient reports abdominal incisional discomfort and lower back pain.  Tolerating diet with no nausea or emesis.  States she urinated once yesterday and then again this am but output is improving.  Asymptomatic during hypotensive episodes yesterday.  Ambulating in the halls without difficulty. Denies chest pain, dyspnea, passing flatus, having a BM. No concerns voiced.  Objective: Vital signs in last 24 hours: Temp:  [98.1 F (36.7 C)-99.5 F (37.5 C)] 99.1 F (37.3 C) (03/19 1103) Pulse Rate:  [60-77] 77 (03/19 0632) Resp:  [16-18] 18 (03/19 1594) BP: (91-121)/(48-63) 113/63 (03/19 5859) SpO2:  [94 %-99 %] 94 % (03/19 2924) Last BM Date: 03/26/18  Intake/Output from previous day: 03/18 0701 - 03/19 0700 In: 3552 [P.O.:2180; I.V.:1372] Out: 1550 [Urine:1550]  Physical Examination: General: alert, cooperative and no distress Resp: clear to auscultation bilaterally Cardio: regular rate and rhythm, S1, S2 normal, no murmur, click, rub or gallop GI: soft, non-tender; bowel sounds normal; no masses,  no organomegaly and incision: midline incision in place with op site dressing, no drainage or bleeding noted underneath Extremities: extremities normal, atraumatic, no cyanosis or edema  Right chest port-a-cath accessed.  Dressing intact.  Labs: WBC/Hgb/Hct/Plts:  10.3/9.2/30.6/161 (03/19 0423) BUN/Cr/glu/ALT/AST/amyl/lip:  12/0.78/--/--/--/--/-- (03/19 0423)  Assessment: 73 y.o. s/p Procedure(s): EXPLORATORY LAPARATOMY, BILATERAL SALPINGOOPHERECTOMY OMENTECTOMY RADICAL TUMOR DEBULKING: stable Pain:  Pain is well-controlled on PRN medications.  Heme: Hgb 9.2 and Hct 30.6 this am. Appropriate when compared to pre-op values and surgical losses (EBL 250 cc).  CV: BP and HR stable.  Metoprolol XL reordered yesterday. Asymptomatic  yesterday afternoon during hypotensive episodes.  Improvement in BP after 500 cc NS bolus.  Hypotension related to hypovolemia. Continue to monitor with ordered vital signs.  GI:  Tolerating po: Yes. Antiemetics ordered if needed.   GU: Output increased.  Bolus given 03/28/2018. Creatinine 0.78 this am.    FEN: No critical labs this am. Continue to monitor with daily labs.  Prophylaxis: Lovenox and SCDs. Plan for lovenox for one week at discharge then transition back to [place].  Plan: Kpad ordered 3/18 for symptom relief but not at bedside- staff notified to ordered If diet tolerated, urine output remains adequate, BP remains stable, consider capping port-a-cath Lovenox teaching kit for home use for one week of lovenox at discharge Encourage ambulation, IS use, deep breathing, and coughing Continue plan of care per Dr. Skeet Latch   LOS: 2 days    Haley [name] 03/29/2018, 10:44 AM   .I saw and examined Haley [name] with [name] NP.  Will place an abdominal binder.  Maylox Rx. Anticipate discharge tomorrow.  Francetta Found. Skeet Latch MD., PhD

## 2018-03-29 NOTE — TOC Initial Note (Signed)
Transition of Care [facility]) - Initial/Assessment Note    Patient Details  Name: Haley [name] MRN: [id number] Date of Birth: 04-18-1945  Transition of Care [facility]) CM/SW Contact:    Lynnell Catalan, RN Phone Number: 03/29/2018, 11:30 AM  Clinical Narrative:                   Expected Discharge Plan: [place] Barriers to Discharge: No Barriers Identified   Patient Goals and CMS Choice        Expected Discharge Plan and Services Expected Discharge Plan: [place] Discharge Planning Services: CM Consult   Living arrangements for the past 2 months: Single Family Home                          Prior Living Arrangements/Services Living arrangements for the past 2 months: Single Family Home Lives with:: Spouse Patient language and need for interpreter reviewed:: Yes        Need for Family Participation in Patient Care: Yes (Comment) Care giver support system in place?: Yes (comment)   Criminal Activity/Legal Involvement Pertinent to Current Situation/Hospitalization: No - Comment as needed  Activities of Daily Living Home Assistive Devices/Equipment: Eyeglasses, Dentures (specify type), Contact lenses ADL Screening (condition at time of admission) Patient's cognitive ability adequate to safely complete daily activities?: Yes Is the patient deaf or have difficulty hearing?: No Does the patient have difficulty seeing, even when wearing glasses/contacts?: No Does the patient have difficulty concentrating, remembering, or making decisions?: No Patient able to express need for assistance with ADLs?: Yes Does the patient have difficulty dressing or bathing?: No Independently performs ADLs?: Yes (appropriate for developmental age) Does the patient have difficulty walking or climbing stairs?: No Weakness of Legs: None Weakness of Arms/Hands: None  Permission Sought/Granted                  Emotional Assessment Appearance:: Appears  stated age            Admission diagnosis:  OVARIAN CANCER Patient Active Problem List   Diagnosis Date Noted  . Pre-operative cardiovascular examination 03/08/2018  . Chest pain with moderate risk of acute coronary syndrome 03/08/2018  . Family history of prostate cancer   . Family history of uterine cancer   . Family history of lung cancer   . Chronic anticoagulation 02/19/2018  . Anemia due to antineoplastic chemotherapy 02/09/2018  . Lower extremity pain 02/05/2018  . Encounter for imaging study to confirm nasogastric (NG) tube placement   . Ovarian cancer ([place]) suspected 01/16/2018  . Partial small bowel obstruction (IXL) 01/16/2018  . Abdominal pain 01/15/2018  . Carcinomatosis ([place]) 01/11/2018  . Essential hypertension, benign 09/13/2017  . Bilateral impacted cerumen 08/16/2017  . OSA (obstructive sleep apnea) 08/16/2017  . Seasonal allergic rhinitis 08/16/2017  . Osteoporosis 01/13/2014  . Urgency incontinence 01/13/2014  . PAF (paroxysmal atrial fibrillation) ([place]) 03/31/2013  . Tobacco use disorder 08/06/2012  . Dyslipidemia 06/05/2012  . Anxiety 06/05/2012  . GERD 06/03/2008  . Hypothyroidism 07/07/2006   PCP:  [place], Star G, MD Pharmacy:   [facility] [place] [place] [address] Phone: [phone] Fax: [phone]  [facility]) - [place], [address] [address] Phone: [phone] Fax: [phone]  [place] [address] [place] - [phone] [place] #  [address] [place] [address] Phone: [phone] Fax: [phone]     Social Determinants of Health (SDOH) Interventions    Readmission Risk Interventions 30 Day Unplanned Readmission Risk Score     Admission (Current) from 03/27/2018 in [place]  30 Day Unplanned Readmission  Risk Score (%)  28 Filed at 03/29/2018 0801     This score is the patient's risk of an unplanned readmission within 30 days of being discharged (0 -100%). The score is based on dignosis, age, lab data, medications, orders, and past utilization.   Low:  0-14.9   Medium: 15-21.9   High: 22-29.9   Extreme: 30 and above       Readmission Risk Prevention Plan 03/29/2018  Transportation Screening Complete  PCP or Specialist Appt within 3-5 Days Not Complete  HRI or [place] Not Complete  HRI or Home Care Consult comments NA  Social Work Consult for [place] Planning/Counseling Not Complete  SW consult not completed comments NA  Palliative Care Screening Not Complete  Palliative Care Screening Not Complete Comments NA  Medication Review Press photographer) Complete  Some recent data might be hidden

## 2018-03-30 ENCOUNTER — Ambulatory Visit: Payer: Self-pay

## 2018-03-30 ENCOUNTER — Telehealth: Payer: Self-pay

## 2018-03-30 LAB — BASIC METABOLIC PANEL
Anion gap: 5 (ref 5–15)
BUN: 11 mg/dL (ref 8–23)
CHLORIDE: 106 mmol/L (ref 98–111)
CO2: 26 mmol/L (ref 22–32)
CREATININE: 0.6 mg/dL (ref 0.44–1.00)
Calcium: 8.4 mg/dL — ABNORMAL LOW (ref 8.9–10.3)
GFR calc Af Amer: >60 mL/min (ref >60)
GFR calc non Af Amer: >60 mL/min (ref >60)
Glucose, Bld: 104 mg/dL — ABNORMAL HIGH (ref 70–99)
Potassium: 3.7 mmol/L (ref 3.5–5.1)
Sodium: 137 mmol/L (ref 135–145)

## 2018-03-30 LAB — CBC
HCT: 28.2 % — ABNORMAL LOW (ref 36.0–46.0)
Hemoglobin: 8.5 g/dL — ABNORMAL LOW (ref 12.0–15.0)
MCH: 29.2 pg (ref 26.0–34.0)
MCHC: 30.1 g/dL (ref 30.0–36.0)
MCV: 96.9 fL (ref 80.0–100.0)
Platelets: 163 10*3/uL (ref 150–400)
RBC: 2.91 MIL/uL — ABNORMAL LOW (ref 3.87–5.11)
RDW: 17.3 % — ABNORMAL HIGH (ref 11.5–15.5)
WBC: 9.8 10*3/uL (ref 4.0–10.5)
nRBC: 0 % (ref 0.0–0.2)

## 2018-03-30 MED ORDER — FERROUS GLUCONATE 324 (38 FE) MG PO TABS
324.0000 mg | ORAL_TABLET | Freq: Three times a day (TID) | ORAL | Status: DC
  Administered 2018-04-01 (×2): 324 mg via ORAL
  Filled 2018-03-30 (×8): qty 1

## 2018-03-30 MED ORDER — KCL IN DEXTROSE-NACL 20-5-0.45 MEQ/L-%-% IV SOLN
INTRAVENOUS | Status: DC
  Administered 2018-03-30 (×2): via INTRAVENOUS
  Filled 2018-03-30 (×3): qty 1000

## 2018-03-30 NOTE — Telephone Encounter (Signed)
   Cardiac Questionnaire:    Since your last visit or hospitalization:    1. Have you been having new or worsening chest pain? NO   2. Have you been having new or worsening shortness of breath? NO 3. Have you been having new or worsening leg swelling, wt gain, or increase in abdominal girth (pants fitting more tightly)? NO   4. Have you had any passing out spells? NO      Patient is currently admitted to hospital. Non-Cardiac related.      Primary Cardiologist:  Shelva Majestic, MD   Patient contacted.  History reviewed.  No symptoms to suggest any unstable cardiac conditions.  Based on discussion, with current pandemic situation, we will be postponing this appointment for [facility].  If symptoms change, she has been instructed to contact our office.   Routing to C19 CANCEL pool for tracking (P CV DIV CV19 CANCEL) and assigning priority (1 = 4-6 wks, 2 = 6-12 wks, 3 = >12 wks).  Priority 1  Ena Dawley, LPN  05/06/8339 9:62 PM         .

## 2018-03-30 NOTE — Progress Notes (Signed)
Patient was educated, reinforce and encouraged to ambulate. Pt has not wanted to yet at this point but was urged to walk several times today.  Will continue to educate and reinforce.

## 2018-03-30 NOTE — Progress Notes (Signed)
3 Days Post-Op Procedure(s) (LRB): EXPLORATORY LAPARATOMY, BILATERAL SALPINGOOPHERECTOMY (N/A) OMENTECTOMY (N/A) RADICAL TUMOR DEBULKING (N/A)  Subjective: Patient reports nausea with emesis since 3 am.  Ambulating without difficulty.  Reporting intermittent belching.   Denies chest pain, dyspnea, passing flatus, having a BM.  No concerns voiced.  Objective: Vital signs in last 24 hours: Temp:  [98.5 F (36.9 C)-99.3 F (37.4 C)] 99.3 F (37.4 C) (03/20 0553) Pulse Rate:  [70-87] 87 (03/20 0553) Resp:  [15-16] 15 (03/20 0553) BP: (112-137)/(61-91) 137/80 (03/20 0553) SpO2:  [96 %-97 %] 97 % (03/20 0553) Last BM Date: 03/26/18  Intake/Output from previous day: 03/19 0701 - 03/20 0700 In: 1502.4 [P.O.:680; I.V.:822.4] Out: 2750 [Urine:2750]  Physical Examination: General: alert, cooperative and no distress Resp: clear to auscultation bilaterally Cardio: regular rate and rhythm, S1, S2 normal, no murmur, click, rub or gallop GI: incision: midline incision in place with op site dressing, no drainage or bleeding noted underneath and abdomen more distended compared with 03/29/2018 assessment but active bowel sounds noted, abdomen soft Extremities: extremities normal, atraumatic, no cyanosis or edema  Right chest port-a-cath accessed.  Dressing intact.  Labs: WBC/Hgb/Hct/Plts:  9.8/8.5/28.2/163 (03/20 7654) BUN/Cr/glu/ALT/AST/amyl/lip:  11/0.60/--/--/--/--/-- (03/20 0349)  Assessment: 73 y.o. s/p Procedure(s): EXPLORATORY LAPARATOMY, BILATERAL SALPINGOOPHERECTOMY OMENTECTOMY RADICAL TUMOR DEBULKING: stable Pain:  Pain is well-controlled on PRN medications.  Heme: Hgb 8.5 and Hct 28.2 this am. Ferrous gluconate started today per Dr. Denman George.  CV: BP and HR stable.  Metoprolol XL reordered 03/28/2018.  Hypotension related to hypovolemia on POD 1 resolved. Continue to monitor with ordered vital signs.  GI:  Tolerating po: No-emesis, most likely with post-op ileus. Decrease PO intake,  bowel rest. Antiemetics ordered if needed.   GU: Output increased.  Bolus given 03/28/2018. Creatinine 0.60 this am.    FEN: No critical labs this am. Continue to monitor with daily labs.  Prophylaxis: Lovenox and SCDs. Plan for lovenox for one week at discharge then transition back to [place].  Plan: Patient advised to take nothing by mouth given ileus Resume IVF at 75 cc/hr Rocking chair to the room AM labs Ferrous gluconate per Dr. Denman George Encourage ambulation, IS use, deep breathing, and coughing Continue plan of care per Dr. Denman George   LOS: 3 days    Dorothyann Gibbs 03/30/2018, 9:31 AM

## 2018-03-30 NOTE — Plan of Care (Signed)

## 2018-03-30 NOTE — Care Management Important Message (Signed)
Important Message  Patient Details  Name: Haley [name] MRN: [id number] Date of Birth: January 16, 1945   Medicare Important Message Given:  Yes    Kerin Salen 03/30/2018, 10:44 AMImportant Message  Patient Details  Name: Haley [name] MRN: [id number] Date of Birth: [birth year]   Medicare Important Message Given:  Yes    Kerin Salen 03/30/2018, 10:44 AM

## 2018-03-31 LAB — CBC
HCT: 25 % — ABNORMAL LOW (ref 36.0–46.0)
Hemoglobin: 7.8 g/dL — ABNORMAL LOW (ref 12.0–15.0)
MCH: 30 pg (ref 26.0–34.0)
MCHC: 31.2 g/dL (ref 30.0–36.0)
MCV: 96.2 fL (ref 80.0–100.0)
Platelets: 144 10*3/uL — ABNORMAL LOW (ref 150–400)
RBC: 2.6 MIL/uL — ABNORMAL LOW (ref 3.87–5.11)
RDW: 17 % — AB (ref 11.5–15.5)
WBC: 7 10*3/uL (ref 4.0–10.5)
nRBC: 0 % (ref 0.0–0.2)

## 2018-03-31 LAB — BASIC METABOLIC PANEL
Anion gap: 5 (ref 5–15)
BUN: 10 mg/dL (ref 8–23)
CO2: 25 mmol/L (ref 22–32)
Calcium: 7.8 mg/dL — ABNORMAL LOW (ref 8.9–10.3)
Chloride: 107 mmol/L (ref 98–111)
Creatinine, Ser: 0.6 mg/dL (ref 0.44–1.00)
GFR calc Af Amer: >60 mL/min (ref >60)
GFR calc non Af Amer: >60 mL/min (ref >60)
GLUCOSE: 89 mg/dL (ref 70–99)
Potassium: 3.5 mmol/L (ref 3.5–5.1)
Sodium: 137 mmol/L (ref 135–145)

## 2018-03-31 LAB — PREPARE RBC (CROSSMATCH)

## 2018-03-31 MED ORDER — ACETAMINOPHEN 325 MG PO TABS
650.0000 mg | ORAL_TABLET | Freq: Once | ORAL | Status: AC
  Administered 2018-03-31: 650 mg via ORAL
  Filled 2018-03-31: qty 2

## 2018-03-31 MED ORDER — SODIUM CHLORIDE 0.9% IV SOLUTION
Freq: Once | INTRAVENOUS | Status: AC
  Administered 2018-03-31: 12:00:00 via INTRAVENOUS

## 2018-03-31 MED ORDER — DIPHENHYDRAMINE HCL 25 MG PO CAPS
25.0000 mg | ORAL_CAPSULE | Freq: Once | ORAL | Status: AC
  Administered 2018-03-31: 25 mg via ORAL
  Filled 2018-03-31: qty 1

## 2018-03-31 MED ORDER — FUROSEMIDE 10 MG/ML IJ SOLN: 20.0000 mg | Freq: Once | INTRAMUSCULAR | Status: DC

## 2018-03-31 NOTE — Plan of Care (Signed)
  Problem: Clinical Measurements: Goal: Will remain free from infection Outcome: Progressing   Problem: Clinical Measurements: Goal: Respiratory complications will improve Outcome: Progressing   Problem: Clinical Measurements: Goal: Cardiovascular complication will be avoided Outcome: Progressing   Problem: Activity: Goal: Risk for activity intolerance will decrease Outcome: Progressing   Problem: Nutrition: Goal: Adequate nutrition will be maintained Outcome: Progressing   Problem: Skin Integrity: Goal: Demonstration of wound healing without infection will improve Outcome: Progressing

## 2018-03-31 NOTE — Progress Notes (Signed)
TC received from blood bank, current guidance states transfusions for hgb of 7 or below. One unit can be processed and then repeat H&H for further assessment. Dr. Denman George notified and order to hold lasix if only one unit of RBC's given

## 2018-03-31 NOTE — Progress Notes (Signed)
4 Days Post-Op Procedure(s) (LRB): EXPLORATORY LAPARATOMY, BILATERAL SALPINGOOPHERECTOMY (N/A) OMENTECTOMY (N/A) RADICAL TUMOR DEBULKING (N/A)  Subjective: Patient reports no nausea and emesis. Passing flatus. Minimal walking/ambulation. Symptomatic with anemia.  Objective: Vital signs in last 24 hours: Temp:  [98.5 F (36.9 C)-99.2 F (37.3 C)] 98.5 F (36.9 C) (03/21 3159) Pulse Rate:  [70-91] 71 (03/21 0613) Resp:  [16] 16 (03/21 0613) BP: (103-132)/(56-81) 103/56 (03/21 0613) SpO2:  [95 %-98 %] 95 % (03/21 4585) Weight:  [140 lb (63.5 kg)] 140 lb (63.5 kg) (03/21 9292) Last BM Date: 03/27/18  Intake/Output from previous day: 03/20 0701 - 03/21 0700 In: 1566.1 [I.V.:1566.1] Out: 1901 [Urine:1900; Emesis/NG output:1]  Physical Examination: General: alert, cooperative and no distress Resp: clear to auscultation bilaterally Cardio: regular rate and rhythm, S1, S2 normal, no murmur, click, rub or gallop GI: incision: midline incision in place with op site dressing, no drainage or bleeding noted underneath and abdomen less distended compared with 03/30/2018 assessment but active bowel sounds noted, abdomen soft Extremities: extremities normal, atraumatic, no cyanosis or edema  Right chest port-a-cath accessed.  Dressing intact.  Labs: WBC/Hgb/Hct/Plts:  7.0/7.8/25.0/144 (03/21 0403) BUN/Cr/glu/ALT/AST/amyl/lip:  10/0.60/--/--/--/--/-- (03/21 0403)  Assessment: 73 y.o. s/p Procedure(s): EXPLORATORY LAPARATOMY, BILATERAL SALPINGOOPHERECTOMY OMENTECTOMY RADICAL TUMOR DEBULKING: stable Pain:  Pain is well-controlled on PRN medications.  Heme: Hgb 7.8 this am. Ferrous gluconate started 03/30/18. Symptomatic so will transfuse with 2 units PRBC.  CV: BP and HR stable.  Metoprolol XL reordered 03/28/2018.  Hypotension related to hypovolemia on POD 1 resolved. Continue to monitor with ordered vital signs.  GI: passing flatus. Will advance diet to full liquids and then regular if  tolerates tthis.   GU: Output appropriate, Creatinine 0.60 this am.    FEN: No critical labs this am. Continue to monitor with daily labs.  Prophylaxis: Lovenox and SCDs. Plan for lovenox for one week at discharge then transition back to [place].  Plan: Blood transfusion  - 2 units PRBC Full liquid diet. D.c. IVF's Plan on d.c. tomorrow if tolerates PO.    LOS: 4 days    Thereasa Solo 03/31/2018, 9:51 AM

## 2018-04-01 LAB — BASIC METABOLIC PANEL
Anion gap: 9 (ref 5–15)
BUN: 10 mg/dL (ref 8–23)
CO2: 23 mmol/L (ref 22–32)
Calcium: 8.1 mg/dL — ABNORMAL LOW (ref 8.9–10.3)
Chloride: 105 mmol/L (ref 98–111)
Creatinine, Ser: 0.54 mg/dL (ref 0.44–1.00)
GFR calc Af Amer: >60 mL/min (ref >60)
GFR calc non Af Amer: >60 mL/min (ref >60)
Glucose, Bld: 84 mg/dL (ref 70–99)
POTASSIUM: 3.3 mmol/L — AB (ref 3.5–5.1)
Sodium: 137 mmol/L (ref 135–145)

## 2018-04-01 LAB — CBC
HCT: 28.8 % — ABNORMAL LOW (ref 36.0–46.0)
Hemoglobin: 9 g/dL — ABNORMAL LOW (ref 12.0–15.0)
MCH: 29.5 pg (ref 26.0–34.0)
MCHC: 31.3 g/dL (ref 30.0–36.0)
MCV: 94.4 fL (ref 80.0–100.0)
Platelets: 157 10*3/uL (ref 150–400)
RBC: 3.05 MIL/uL — ABNORMAL LOW (ref 3.87–5.11)
RDW: 15.9 % — ABNORMAL HIGH (ref 11.5–15.5)
WBC: 6 10*3/uL (ref 4.0–10.5)
nRBC: 0 % (ref 0.0–0.2)

## 2018-04-01 MED ORDER — ALUM & MAG HYDROXIDE-SIMETH 200-200-20 MG/5ML PO SUSP: 30.0000 mL | Freq: Four times a day (QID) | ORAL | 0 refills | Status: AC | PRN

## 2018-04-01 MED ORDER — HEPARIN SOD (PORK) LOCK FLUSH 100 UNIT/ML IV SOLN
500.0000 [IU] | INTRAVENOUS | Status: AC | PRN
  Administered 2018-04-01: 500 [IU]

## 2018-04-01 MED ORDER — ENOXAPARIN (LOVENOX) PATIENT EDUCATION KIT
PACK | Freq: Once | Status: AC
  Administered 2018-04-01: 15:00:00

## 2018-04-01 MED ORDER — RIVAROXABAN 20 MG PO TABS: 20.0000 mg | ORAL_TABLET | Freq: Every day | ORAL | 0 refills | Status: DC

## 2018-04-01 MED ORDER — OXYCODONE HCL 5 MG PO TABS: 5.0000 mg | ORAL_TABLET | ORAL | 0 refills | Status: DC | PRN

## 2018-04-01 MED ORDER — ACETAMINOPHEN 500 MG PO TABS: 1000.0000 mg | ORAL_TABLET | Freq: Four times a day (QID) | ORAL | 0 refills | Status: DC

## 2018-04-01 MED ORDER — SENNA 8.6 MG PO TABS: 1.0000 | ORAL_TABLET | Freq: Every evening | ORAL | 0 refills | Status: AC | PRN

## 2018-04-01 MED ORDER — FERROUS GLUCONATE 324 (38 FE) MG PO TABS: 324.0000 mg | ORAL_TABLET | Freq: Three times a day (TID) | ORAL | 3 refills | Status: DC

## 2018-04-01 MED ORDER — ENOXAPARIN SODIUM 40 MG/0.4ML ~~LOC~~ SOLN: 40.0000 mg | SUBCUTANEOUS | 0 refills | Status: DC

## 2018-04-01 NOTE — Discharge Instructions (Signed)
04/01/2018  Return to work: 6 weeks  Activity: 1. Be up and out of the bed during the day.  Take a nap if needed.  You may walk up steps but be careful and use the hand rail.  Stair climbing will tire you more than you think, you may need to stop part way and rest.   2. No lifting or straining for 6 weeks.  3. No driving for 1 weeks.  Do Not drive if you are taking narcotic pain medicine.  4. Shower daily.  Use soap and water on your incision and pat dry; don't rub.   5. No sexual activity and nothing in the vagina for 2 weeks.  6. THERE IS A WIDESPREAD DEADLY VIRUS IN THE COMMUNITY CALLED CORONAVIRUS. PLEASE STAY AT HOME AND ENSURE MINIMAL CONTACT WITH FAMILY AND FRIENDS. PLEASE ENSURE THAT YOU HAVE NO CONTACT WITH SICK INDIVIDUALS AND STAY 6 FEET FROM ANY VISITORS. ENSURE THAT ALL VISITORS THOROUGHLY [place] THEIR HANDS BEFORE THEY TOUCH ANYTHING THAT YOU WILL LATER TOUCH (such as food, medications or your belongings).   Medications:  - Take ibuprofen and tylenol first line for pain control. Take these regularly (every 6 hours) to decrease the build up of pain.  - If necessary, for severe pain not relieved by ibuprofen, take oxycodone. Dr Denman George has prescribed 10 tablets of these for you.  - While taking oxycodone you should take sennakot every night to reduce the likelihood of constipation. If this causes diarrhea, stop its use.  - you are anemic. Please take a tablet of ferrous gluconate (iron) 3 times a day, ideally with orange juice. Take senakot while you are taking this to prevent constipation.  - you are at a high risk for blood clot but also a high risk for bleeding. Dr Denman George would like you to take the lovenox shots once a day until the last dose on Thursday, March 26th. DO NOT TAKE XARELTO (your tablet blood thinner) WHILE YOU ARE TAKING THE LOVENOX SHOTS AS THIS WILL THIN YOUR BLOOD TOO MUCH. Please start taking the xarelto ON Friday MARCH 27TH after you have completed all of the  lovenox shots.  Call [phone] and ask to speak to [facility] if you have questions regarding this.  Diet: 1. Low sodium Heart Healthy Diet is recommended.  2. It is safe to use a laxative if you have difficulty moving your bowels.   Wound Care: 1. Keep clean and dry.  Shower daily. 2. You can get the dressing wet in the shower, but avoid tub baths. 3. Remove the dressing on Tuesday 04/03/18. Do not leave the dressing on beyond that point in time. 4. After the dressing is removed you can get the incision wet in the shower, however continue to avoid tub baths until advised otherwise by your surgeon at follow-up.   Reasons to call the Doctor:   Fever - Oral temperature greater than 100.4 degrees Fahrenheit  Foul-smelling vaginal discharge  Difficulty urinating  Nausea and vomiting  Increased pain at the site of the incision that is unrelieved with pain medicine.  Difficulty breathing with or without chest pain  New calf pain especially if only on one side  Sudden, continuing increased vaginal bleeding with or without clots.   Follow-up: 1. See Everitt Amber in 2 weeks.  Contacts: For questions or concerns you should contact:  Dr. Everitt Amber at [phone] After hours and on week-ends call [phone] and ask to speak to the physician  on call for Gynecologic Oncology

## 2018-04-01 NOTE — Discharge Summary (Signed)
Physician Discharge Summary  Patient ID: Haley [name] MRN: [id number] DOB/AGE: 17-Jul-1945 73 y.o.  Admit date: 03/27/2018 Discharge date: 04/01/2018  Admission Diagnoses: Ovarian cancer [facility])  Discharge Diagnoses:  Principal Problem:   Ovarian cancer ([place]) suspected Active Problems:   Carcinomatosis [facility])   Discharged Condition: good  Hospital Course:  1/ patient was admitted on 03/27/18 for an exploratory laparotomy, BSO, omentectomy, radical tumor debulking for ovarian cancer 2/ surgery was uncomplicated  3/ on postoperative day 4 she was noted to have a falling hb that was not correcting with oral iron. She was transfused 1 unit of PRBC. On POD 5 the patient was meeting discharge criteria: tolerating PO, voiding urine, ambulating, pain well controlled on oral medications. She had a bowel movement and passed flatus.  4/ new medications on discharge include ferrous gluconate for anemia, lovenox 40mg  once a day for 4 days total, oxycodone 5mg , sennakot qhs for constipation.  She was instructed to hold her xarelto until Friday, 3/27 when she has completed lovenox prophylaxis.   Consults: None  Significant Diagnostic Studies: labs:  CBC    Component Value Date/Time   WBC 6.0 04/01/2018 0111   RBC 3.05 (L) 04/01/2018 0111   HGB 9.0 (L) 04/01/2018 0111   HGB 10.3 (L) 03/02/2018 0908   HCT 28.8 (L) 04/01/2018 0111   PLT 157 04/01/2018 0111   PLT 200 03/02/2018 0908   MCV 94.4 04/01/2018 0111   MCH 29.5 04/01/2018 0111   MCHC 31.3 04/01/2018 0111   RDW 15.9 (H) 04/01/2018 0111   LYMPHSABS 1.0 03/02/2018 0908   MONOABS 0.0 (L) 03/02/2018 0908   EOSABS 0.0 03/02/2018 0908   BASOSABS 0.0 03/02/2018 0908   BMET    Component Value Date/Time   NA 137 04/01/2018 0111   K 3.3 (L) 04/01/2018 0111   CL 105 04/01/2018 0111   CO2 23 04/01/2018 0111   GLUCOSE 84 04/01/2018 0111   BUN 10 04/01/2018 0111   CREATININE 0.54 04/01/2018 0111   CREATININE 0.79 03/02/2018 0908    CREATININE 0.76 11/17/2015 1005   CALCIUM 8.1 (L) 04/01/2018 0111   GFRNONAA >60 04/01/2018 0111   GFRNONAA >60 03/02/2018 0908   GFRAA >60 04/01/2018 0111   GFRAA >60 03/02/2018 0908     Treatments: surgery: see above  Discharge Exam: Blood pressure (!) 144/78, pulse 63, temperature 98.3 F (36.8 C), temperature source Oral, resp. rate 16, height 5\' 2"  (1.575 m), weight 140 lb 14 oz (63.9 kg), SpO2 95 %. General appearance: alert and cooperative GI: soft, non-tender; bowel sounds normal; no masses,  no organomegaly Neurologic: Grossly normal  Disposition: Discharge disposition: 01-Home or Self Care       Discharge Instructions    (HEART FAILURE PATIENTS) Call MD:  Anytime you have any of the following symptoms: 1) 3 pound weight gain in 24 hours or 5 pounds in 1 week 2) shortness of breath, with or without a dry hacking cough 3) swelling in the hands, feet or stomach 4) if you have to sleep on extra pillows at night in order to breathe.   Complete by:  As directed    Call MD for:  difficulty breathing, headache or visual disturbances   Complete by:  As directed    Call MD for:  extreme fatigue   Complete by:  As directed    Call MD for:  hives   Complete by:  As directed    Call MD for:  persistant dizziness or light-headedness   Complete  by:  As directed    Call MD for:  persistant nausea and vomiting   Complete by:  As directed    Call MD for:  redness, tenderness, or signs of infection (pain, swelling, redness, odor or green/yellow discharge around incision site)   Complete by:  As directed    Call MD for:  severe uncontrolled pain   Complete by:  As directed    Call MD for:  temperature >100.4   Complete by:  As directed    Diet - low sodium heart healthy   Complete by:  As directed    Diet general   Complete by:  As directed    Driving Restrictions   Complete by:  As directed    No driving for 7 days or until off narcotic pain medication   Increase activity  slowly   Complete by:  As directed    Remove dressing in 24 hours   Complete by:  As directed    Sexual Activity Restrictions   Complete by:  As directed    No intercourse for 6 weeks     Allergies as of 04/01/2018      Reactions   Tramadol Hcl    jittery      Medication List    TAKE these medications   acetaminophen 500 MG tablet Commonly known as:  TYLENOL Take 2 tablets (1,000 mg total) by mouth every 6 (six) hours.   ALPRAZolam 0.5 MG tablet Commonly known as:  XANAX Take 0.5-1 tablets (0.25-0.5 mg total) by mouth daily as needed for anxiety.   alum & mag hydroxide-simeth 200-200-20 MG/5ML suspension Commonly known as:  MAALOX/MYLANTA Take 30 mLs by mouth every 6 (six) hours as needed for indigestion or heartburn.   Caltrate 600 1500 (600 Ca) MG Tabs tablet Generic drug:  calcium carbonate Take 600 mg of elemental calcium by mouth daily.   cholecalciferol 1000 units tablet Commonly known as:  VITAMIN D Take 1,000 Units by mouth daily.   dexamethasone 4 MG tablet Commonly known as:  DECADRON Take 2 tabs at the night before and 2 tabs the morning of chemotherapy, every 3 weeks, by mouth   enoxaparin 40 MG/0.4ML injection Commonly known as:  LOVENOX Inject 0.4 mLs (40 mg total) into the skin daily. Start taking on:  April 02, 2018   EYE DROPS OP Place 1 drop into both eyes daily as needed (dry eyes).   ferrous gluconate 324 MG tablet Commonly known as:  FERGON Take 1 tablet (324 mg total) by mouth 3 (three) times daily with meals.   Fish Oil 1000 MG Caps Take 2,000 mg by mouth 2 (two) times daily.   fluticasone 50 MCG/ACT nasal spray Commonly known as:  FLONASE Use 2 sprays in each nostril daily   hydrocortisone 25 MG suppository Commonly known as:  ANUSOL-HC Place 1 suppository (25 mg total) rectally 2 (two) times daily as needed for hemorrhoids or anal itching.   ibuprofen 200 MG tablet Commonly known as:  ADVIL,MOTRIN Take 600 mg by mouth every 6  (six) hours as needed for moderate pain.   lactose free nutrition Liqd Take 237 mLs by mouth daily. What changed:  when to take this   levothyroxine 75 MCG tablet Commonly known as:  SYNTHROID, LEVOTHROID Take 1 tablet (75 mcg total) by mouth daily. What changed:  when to take this   lidocaine-prilocaine cream Commonly known as:  EMLA Apply 1 application topically as needed.   Magnesium 400 MG Tabs Take  250 mg by mouth daily.   metoprolol succinate 50 MG 24 hr tablet Commonly known as:  TOPROL-XL Take 1 tablet (50 mg total) by mouth daily. Take with or immediately following a meal.   multivitamin with minerals tablet Take 1 tablet by mouth daily.   Muscle Rub 10-15 % Crea Apply 1 application topically as needed for muscle pain.   omeprazole 40 MG capsule Commonly known as:  PRILOSEC Take 40 mg by mouth daily.   ondansetron 8 MG tablet Commonly known as:  ZOFRAN Take 1 tablet (8 mg total) by mouth every 8 (eight) hours as needed for nausea.   oxyCODONE 5 MG immediate release tablet Commonly known as:  Oxy IR/ROXICODONE Take 1 tablet (5 mg total) by mouth every 4 (four) hours as needed for severe pain.   polyethylene glycol packet Commonly known as:  MIRALAX / GLYCOLAX Take 17 g by mouth daily. What changed:    when to take this  reasons to take this   prochlorperazine 10 MG tablet Commonly known as:  COMPAZINE Take 1 tablet (10 mg total) by mouth every 6 (six) hours as needed for nausea or vomiting.   rivaroxaban 20 MG Tabs tablet Commonly known as:  XARELTO Take 1 tablet (20 mg total) by mouth daily with supper. Start taking on:  April 06, 2018 What changed:  These instructions start on April 06, 2018. If you are unsure what to do until then, ask your doctor or other care provider.   senna 8.6 MG Tabs tablet Commonly known as:  SENOKOT Take 1 tablet (8.6 mg total) by mouth at bedtime as needed for mild constipation.   senna-docusate 8.6-50 MG  tablet Commonly known as:  Senokot-S Take 2 tablets by mouth at bedtime. For AFTER surgery   simvastatin 20 MG tablet Commonly known as:  ZOCOR Take 1 tablet (20 mg total) by mouth at bedtime.   vitamin C 500 MG tablet Commonly known as:  ASCORBIC ACID Take 1,000 mg by mouth daily.        Signed: Thereasa Solo 04/01/2018, 10:33 AM

## 2018-04-02 ENCOUNTER — Telehealth: Payer: Self-pay | Admitting: Oncology

## 2018-04-02 LAB — BPAM RBC
Blood Product Expiration Date: 202004132359
ISSUE DATE / TIME: 202003211615
Unit Type and Rh: 5100

## 2018-04-02 LAB — TYPE AND SCREEN
ABO/RH(D): O POS
Antibody Screen: NEGATIVE
UNIT DIVISION: 0

## 2018-04-02 NOTE — Telephone Encounter (Signed)
Called Haley [name] to see how she is doing. She said her stomach is sore and woke her up at 5 am this morning.  She is taking tylenol and rotating in ibuprofen.  She is going to try taking Oxycodone tonight.  She is also using an abdominal binder which helps.  She said her bowels are moving well and had 3 bowel movements today.  She denies having any nausea and is trying to advance her diet slowly.  She said her incision is fine and she will remove the dressing tomorrow.  She also said she has a "drippy nose" from allergies and is going to try her nasal spray today.  Advised her to call with any questions.

## 2018-04-03 ENCOUNTER — Telehealth: Payer: Self-pay | Admitting: *Deleted

## 2018-04-03 ENCOUNTER — Other Ambulatory Visit: Payer: Self-pay

## 2018-04-03 ENCOUNTER — Ambulatory Visit: Payer: PPO | Admitting: Cardiovascular Disease

## 2018-04-03 NOTE — Telephone Encounter (Signed)
Called and moved her appt to earlier in the day on 4/1

## 2018-04-04 NOTE — Patient Outreach (Signed)
Haley [name] [facility]) Haley [name] [birth year] [id number]    Follow up outreach post hospital discharge on 04/01/18. Admitted on 03/27/18 for exploratory laparotomy with bilateral salpingo-oophorectomy, omentectomy, and radical tumor debulking for ovarian cancer. Successful outreach with Haley [name].  Medications reviewed. Denies worsening symptoms since discharge but continues to experience abdominal discomfort and decreased appetite. Reports relief with alternating doses of acetaminophen and ibuprofen during the day and oxycodone at night. Reports taking Maalox every six hours. Denies episodes of nausea or vomiting. Tolerating small meals.  As expected, her activity tolerance has decreased however she declined need for additional assistance in the home. Reports using safety measures and ambulating slowly.  Plans to remove her abdominal dressing later today. Discussed precautions and s/sx of infection. Haley [name] verbalized awareness of symptoms that require medical follow-up. [facility] CM Care Plan Problem One     Most Recent Value  Care Plan Problem One  Risk for Readmission  Role Documenting the Problem One  Care Management Macy for Problem One  Active  [facility] Long Term Goal   Over the next 60 days patient will not be admitted for chronic disease related complications.  [facility] Long Term Goal Start Date  02/21/18  Interventions for Problem One Long Term Goal  Discussed post op/discharge instructions and medications. Reviewed fall and in-home safety precautions. [Recently hospitalized for scheduled surgery.]  [facility] CM Short Term Goal #1   Over the next 30 days patient will take all medications as prescribed.  [facility] CM Short Term Goal #1 Start Date  02/21/18  Interventions for Short Term Goal #1  Reviewed post-op medications and changes. Discussed indications for use.  [facility] CM Short Term Goal #2   Over the next 30 days patient will attend all PCP and specialty  appointments as scheduled.  [facility] CM Short Term Goal #2 Start Date  02/21/18  [facility] CM Short Term Goal #2 Met Date  04/03/18  [facility] CM Short Term Goal #3  Over the next 30 days patient will verbalize knowledge of home safety measures.  [facility] CM Short Term Goal #3 Start Date  02/21/18  [facility] CM Short Term Goal #3 Met Date  04/03/18     PLAN Will follow up next week.   [place] [phone]

## 2018-04-10 ENCOUNTER — Telehealth: Payer: Self-pay | Admitting: *Deleted

## 2018-04-10 NOTE — Progress Notes (Addendum)
Follow-up Note: Gyn-Onc CC:  Chief Complaint  Patient presents with  . Malignant neoplasm of ovary, unspecified laterality [facility])    Assessment/Plan:  [name]  is a 73 y.o.  year old with stage IIIC ovarian cancer, s/p 3 cycles carboplatin and paclitaxel (day 1 of cycle 3 on 03/02/18). S/p interval optimal cytoreduction on 03/27/18.   I am recommending that Haley [name] resume 3 additional cycles of chemotherapy.   I will see her back after completing for ongoing surveillance.   Genetic testing. PARPi for maintenance if BRCA positive.   HPI:    [name] is a 73 year old woman who was seen in consultation at the request of Dr Haley [name] in January, 2020 (initially by Dr Haley [name]) for apparent stage IIIC ovarian cancer. At that time she had urinary incontinence about 1 year. This started worsening in Sept 2019. She saw urology and was told she was "backed up" in her words. She was given a medication for the incontinence and associates the use of this medication with the start of constipation. Thinking it was the medication she tried to change her diet to help. When the second week came around she started noticing back pain. By the 3rd week this was fairly severe. After discussing with her PCP she was started on Miralax and colace and mag citrate x 1. This helped with her BM but she was starting to feel swollen in her abdomen.   On 01/06/18 she felt significant pain and cramps and due to pain went to [place]. Imaging revealed a pelvic mass and carcinomatosis concerning for ovarian cancer.   01/06/18 CT notes: IMPRESSION: 1. Complex cystic mass at the right adnexa, measuring 5.9 x 4.0 cm, with nodular components, concerning for primary ovarian malignancy. 2. Diffuse nodularity along the omentum at the left side of the abdomen, extending into the mesentery at the left mid abdomen, concerning for peritoneal carcinomatosis. 3. Wall thickening at the distal ileum adjacent to the  ovarian mass; bowel loops appear somewhat adherent to the ovarian mass. Bowel infiltration with tumor cannot be excluded. No evidence of bowel obstruction at this time. 4. Small volume ascites within the abdomen and pelvis.  She had some nausea/emesis and significant difficulty eating.   She followed up with Dr.Fontaine, who upon reviewing her case encouraged her to followup here with gynecologic oncology, also out of concern for ovarian cancer. CA125 was drawn with Dr. Phineas [name] and is quite elevated at 3004  Due to her apparent small bowel involvement with obstruction, she was treated with neoadjuvant chemotherapy for 3 cycles (starting on 01/18/18).  She returns to the office for follow-up having now undergone 3 cycles of neoadjuvant chemotherapy. She is feeling much better and her bowels have normalized.  She had an initial paracentesis but did not need a repeat. Cytology from that showed 01/15/18 PERITONEAL/ASCITIC FLUID (SPECIMEN 1 OF 1 COLLECTED 01/18/18): MALIGNANT CELLS CONSISTENT WITH METASTATIC ADENOCARCINOMA. SEE COMMENT. COMMENT: THE MALIGNANT CELLS ARE POSITIVE FOR MOC-31, CYTOKERATIN 7, ESTROGEN RECEPTOR, PAX-8, AND WT-1. THEY ARE NEGATIVE FOR CALRETININ, CYTOKERATIN 5/6, AND CYTOKERATIN 20. THE PROFILE IS CONSISTENT WITH A PRIMARY GYNECOLOGIC CARCINOMA. THERE IS LIKELY SUFFICIENT TUMOR PRESENT, IF ADDITIONAL STUDIES ARE REQUESTED.  Noted some arthralgias but overall feels well.   CA 125 had improved at the time of cycle 3 and was 174.   CT abd/pelvis on March 15, 2018 showed bilateral adnexal masses remained in situ measuring 6.2 on the right previously 4.9 cm and 4.7  cm on the left previously 5.3 cm.  The ascites had resolved.  The omental caking was mildly improved.  There was no suspicious lymphadenopathy.  There was mild bilateral hydronephrosis that was new felt to be secondary to extrinsic compression from the adnexal masses.  No new lesions were seen.  Interval Hx:  On  03/27/18 she underwent exploratory laparotomy, BSO, omentectomy radical tumor debulking. Intraoperative findings were significant for omental caking, peritoneal nodules, diaphragmatic nodularity, nodularity on sigmoid colon. At the completion of the procedure there was <1cm disease at any location representing R1 optimal cytoreduction. She did well postoperatively with no specific complications.   Today she is doing well with no complaints.    Current Meds:  Outpatient Encounter Medications as of 04/11/2018  Medication Sig  . acetaminophen (TYLENOL) 500 MG tablet Take 2 tablets (1,000 mg total) by mouth every 6 (six) hours.  . ALPRAZolam (XANAX) 0.5 MG tablet Take 0.5-1 tablets (0.25-0.5 mg total) by mouth daily as needed for anxiety.  Marland Kitchen alum & mag hydroxide-simeth (MAALOX/MYLANTA) 200-200-20 MG/5ML suspension Take 30 mLs by mouth every 6 (six) hours as needed for indigestion or heartburn.  . Calcium Carbonate (CALTRATE 600) 1500 MG TABS Take 600 mg of elemental calcium by mouth daily.   . Carboxymethylcellulose Sodium (EYE DROPS OP) Place 1 drop into both eyes daily as needed (dry eyes).   . cholecalciferol (VITAMIN D) 1000 UNITS tablet Take 1,000 Units by mouth daily.   Marland Kitchen dexamethasone (DECADRON) 4 MG tablet Take 2 tabs at the night before and 2 tabs the morning of chemotherapy, every 3 weeks, by mouth  . ferrous gluconate (FERGON) 324 MG tablet Take 1 tablet (324 mg total) by mouth 3 (three) times daily with meals.  . fluticasone (FLONASE) 50 MCG/ACT nasal spray Use 2 sprays in each nostril daily (Patient taking differently: Place 2 sprays into both nostrils daily. )  . hydrocortisone (ANUSOL-HC) 25 MG suppository Place 1 suppository (25 mg total) rectally 2 (two) times daily as needed for hemorrhoids or anal itching.  Marland Kitchen ibuprofen (ADVIL,MOTRIN) 200 MG tablet Take 600 mg by mouth every 6 (six) hours as needed for moderate pain.   Marland Kitchen lactose free nutrition (BOOST PLUS) LIQD Take 237 mLs by mouth  daily. (Patient taking differently: Take 237 mLs by mouth 2 (two) times daily between meals. )  . levothyroxine (SYNTHROID, LEVOTHROID) 75 MCG tablet Take 1 tablet (75 mcg total) by mouth daily. (Patient taking differently: Take 75 mcg by mouth daily before breakfast. )  . lidocaine-prilocaine (EMLA) cream Apply 1 application topically as needed.  . Magnesium 400 MG TABS Take 250 mg by mouth daily.  . Menthol-Methyl Salicylate (MUSCLE RUB) 10-15 % CREA Apply 1 application topically as needed for muscle pain.  . metoprolol succinate (TOPROL-XL) 50 MG 24 hr tablet Take 1 tablet (50 mg total) by mouth daily. Take with or immediately following a meal.  . Multiple Vitamins-Minerals (MULTIVITAMIN WITH MINERALS) tablet Take 1 tablet by mouth daily.    . Omega-3 Fatty Acids (FISH OIL) 1000 MG CAPS Take 2,000 mg by mouth 2 (two) times daily.   Marland Kitchen omeprazole (PRILOSEC) 40 MG capsule Take 40 mg by mouth daily.  . ondansetron (ZOFRAN) 8 MG tablet Take 1 tablet (8 mg total) by mouth every 8 (eight) hours as needed for nausea.  Marland Kitchen oxyCODONE (OXY IR/ROXICODONE) 5 MG immediate release tablet Take 1 tablet (5 mg total) by mouth every 4 (four) hours as needed for severe pain.  . polyethylene glycol (MIRALAX /  GLYCOLAX) packet Take 17 g by mouth daily. (Patient taking differently: Take 17 g by mouth daily as needed for moderate constipation. )  . prochlorperazine (COMPAZINE) 10 MG tablet Take 1 tablet (10 mg total) by mouth every 6 (six) hours as needed for nausea or vomiting.  . rivaroxaban (XARELTO) 20 MG TABS tablet Take 1 tablet (20 mg total) by mouth daily with supper.  . senna (SENOKOT) 8.6 MG TABS tablet Take 1 tablet (8.6 mg total) by mouth at bedtime as needed for mild constipation.  . senna-docusate (SENOKOT-S) 8.6-50 MG tablet Take 2 tablets by mouth at bedtime. For AFTER surgery  . simvastatin (ZOCOR) 20 MG tablet Take 1 tablet (20 mg total) by mouth at bedtime.  . vitamin C (ASCORBIC ACID) 500 MG tablet  Take 1,000 mg by mouth daily.  Marland Kitchen enoxaparin (LOVENOX) 40 MG/0.4ML injection Inject 0.4 mLs (40 mg total) into the skin daily.   No facility-administered encounter medications on file as of 04/11/2018.     Allergy:  Allergies  Allergen Reactions  . Tramadol Hcl     jittery    Social Hx:   Social History   Socioeconomic History  . Marital status: Married    Spouse name: Not on file  . Number of children: Not on file  . Years of education: Not on file  . Highest education level: Not on file  Occupational History  . Not on file  Social Needs  . Financial resource strain: Not on file  . Food insecurity:    Worry: Not on file    Inability: Not on file  . Transportation needs:    Medical: Not on file    Non-medical: Not on file  Tobacco Use  . Smoking status: Light Tobacco Smoker    Years: 10.00    Types: Cigars  . Smokeless tobacco: Never Used  . Tobacco comment: not daily  [place] cigars 1-2   Substance and Sexual Activity  . Alcohol use: No    Alcohol/week: 0.0 standard drinks  . Drug use: No  . Sexual activity: Yes    Comment: 1st intercourse 17yo-1 partner  Lifestyle  . Physical activity:    Days per week: Not on file    Minutes per session: Not on file  . Stress: Not on file  Relationships  . Social connections:    Talks on phone: Not on file    Gets together: Not on file    Attends religious service: Not on file    Active member of club or organization: Not on file    Attends meetings of clubs or organizations: Not on file    Relationship status: Not on file  . Intimate partner violence:    Fear of current or ex partner: Not on file    Emotionally abused: Not on file    Physically abused: Not on file    Forced sexual activity: Not on file  Other Topics Concern  . Not on file  Social History Narrative  . Not on file    Past Surgical Hx:  Past Surgical History:  Procedure Laterality Date  . ABDOMINAL HYSTERECTOMY  1989  . APPENDECTOMY  1989   with  hysterectomy  . BILATERAL SALPINGECTOMY N/A 03/27/2018   Procedure: EXPLORATORY LAPARATOMY, BILATERAL SALPINGOOPHERECTOMY;  Surgeon: Everitt Amber, MD;  Location: [facility];  Service: Gynecology;  Laterality: N/A;  . BREAST EXCISIONAL BIOPSY Left 1995   Benign  . COLONOSCOPY  11-20-2000  . DEBULKING N/A 03/27/2018   Procedure: RADICAL  TUMOR DEBULKING;  Surgeon: Everitt Amber, MD;  Location: [facility];  Service: Gynecology;  Laterality: N/A;  . IR IMAGING GUIDED PORT INSERTION  01/17/2018  . NOSE SURGERY  1990  . OMENTECTOMY N/A 03/27/2018   Procedure: OMENTECTOMY;  Surgeon: Everitt Amber, MD;  Location: [facility];  Service: Gynecology;  Laterality: N/A;    Past Medical Hx:  Past Medical History:  Diagnosis Date  . Allergy    SEASONAL  . Anxiety   . Cancer ([place])    ovarian cancer  . Cataract    BILATERAL  . Dysrhythmia   . Family history of lung cancer   . Family history of prostate cancer   . Family history of prostate cancer   . Family history of uterine cancer   . Fibromyalgia   . GERD (gastroesophageal reflux disease)   . H/O echocardiogram 04/28/08   EF>55% trace mitral regurgitation, No significant valvular pathology  . Hemorrhoids    internal  . History of stress test 02/08/2011   Normal Myocardial perfusion study, this is a low risk scan, No prior study available for comparison  . Hyperlipidemia   . Hypertension    hx of  . Hypothyroidism   . MVP (mitral valve prolapse)    mild and MR  . OA (osteoarthritis)   . Osteoporosis   . Paroxysmal A-fib ([place])   . Sleep apnea    does not use prescribed CPAP  does not tolerate  . Thyroid disease    HYPO  . Varicosities of leg   . Vitamin D deficiency     Past Gynecological History:  Abdominal hysterectomy for benign desease. No LMP recorded. Patient has had a hysterectomy.  Family Hx:  Family History  Problem Relation Age of Onset  . Diabetes Daughter   . Diabetes Mother   . Hypertension Mother   . Heart disease Mother   .  Stroke Maternal Grandmother   . Lung cancer Niece   . Cancer Paternal Aunt        type of cancer unk  . Prostate cancer Paternal Uncle   . Uterine cancer Cousin        dx under 85  . Colon cancer Neg Hx     Review of Systems:  Constitutional  Feels fatigued   ENT Normal appearing ears and nares bilaterally Skin/Breast  No rash, sores, jaundice, itching, dryness Cardiovascular  No chest pain, shortness of breath, or edema  Pulmonary  No cough or wheeze.  Gastro Intestinal  No nausea, vomitting, or diarrhoea. No bright red blood per rectum, no abdominal pain, change in bowel movement, or constipation.  Genito Urinary  No frequency, urgency, dysuria, no bleeding Musculo Skeletal  No myalgia, arthralgia, joint swelling or pain  Neurologic  No weakness, numbness, change in gait,  Psychology  No depression, anxiety, insomnia.   Vitals:  Blood pressure 139/75, pulse (!) 58, temperature 98.4 F (36.9 C), temperature source Oral, resp. rate 18, height '5\' 2"'$  (1.575 m), weight 128 lb 12.8 oz (58.4 kg), SpO2 100 %.  Physical Exam: WD in NAD Neck  Supple NROM, without any enlargements.  Lymph Node Survey No cervical supraclavicular or inguinal adenopathy Cardiovascular  Pulse normal rate, regularity and rhythm. S1 and S2 normal.  Lungs  Clear to auscultation bilateraly, without wheezes/crackles/rhonchi. Good air movement.  Skin  No rash/lesions/breakdown  Psychiatry  Alert and oriented to person, place, and time  Abdomen  Normoactive bowel sounds, abdomen soft, non-tender and nonobese without evidence of hernia. Incision  well healed, some mild erythema near umbilicus but no drainage. Back No CVA tenderness Genito Urinary  deferred Rectal  Deferred  Extremities  No bilateral cyanosis, clubbing or edema.   30 minutes of direct face to face counseling time was spent with the patient. This included discussion about prognosis, therapy recommendations and postoperative side  effects and are beyond the scope of routine postoperative care.   Thereasa Solo, MD  04/11/2018, 3:09 PM

## 2018-04-10 NOTE — Telephone Encounter (Addendum)
Called and spoke with the patient regarding her appt for tomorrow. Patient has not had any signs/symptoms, traveled or have any exposure to COVID-19. Patient has not been around anyone sick or exposure to COVID-19. Explained that she will be asked the questions again tomorrow at the front desk; along with a temperature. Also explained the no visitor policy. Patient asked "can I use the disinfectant soap from surgery to help protect me tomorrow?" Per Melissa APP the soap will not help, explained the above to the patient.

## 2018-04-11 ENCOUNTER — Encounter: Payer: Self-pay | Admitting: Oncology

## 2018-04-11 ENCOUNTER — Other Ambulatory Visit: Payer: Self-pay | Admitting: Family Medicine

## 2018-04-11 ENCOUNTER — Inpatient Hospital Stay (HOSPITAL_BASED_OUTPATIENT_CLINIC_OR_DEPARTMENT_OTHER): Payer: PPO | Admitting: Gynecologic Oncology

## 2018-04-11 ENCOUNTER — Other Ambulatory Visit: Payer: Self-pay

## 2018-04-11 ENCOUNTER — Inpatient Hospital Stay: Payer: PPO | Attending: Obstetrics

## 2018-04-11 ENCOUNTER — Ambulatory Visit: Payer: PPO | Admitting: Gynecologic Oncology

## 2018-04-11 ENCOUNTER — Encounter: Payer: Self-pay | Admitting: Gynecologic Oncology

## 2018-04-11 ENCOUNTER — Other Ambulatory Visit: Payer: PPO

## 2018-04-11 VITALS — BP 139/75 | HR 58 | Temp 98.4°F | Resp 18 | Ht 62.0 in | Wt 128.8 lb

## 2018-04-11 DIAGNOSIS — M79606 Pain in leg, unspecified: Secondary | ICD-10-CM | POA: Insufficient documentation

## 2018-04-11 DIAGNOSIS — C569 Malignant neoplasm of unspecified ovary: Secondary | ICD-10-CM

## 2018-04-11 DIAGNOSIS — C561 Malignant neoplasm of right ovary: Secondary | ICD-10-CM | POA: Diagnosis not present

## 2018-04-11 DIAGNOSIS — Z79899 Other long term (current) drug therapy: Secondary | ICD-10-CM | POA: Diagnosis not present

## 2018-04-11 DIAGNOSIS — Z8049 Family history of malignant neoplasm of other genital organs: Secondary | ICD-10-CM | POA: Diagnosis not present

## 2018-04-11 DIAGNOSIS — F419 Anxiety disorder, unspecified: Secondary | ICD-10-CM

## 2018-04-11 DIAGNOSIS — D6481 Anemia due to antineoplastic chemotherapy: Secondary | ICD-10-CM | POA: Insufficient documentation

## 2018-04-11 DIAGNOSIS — Z7901 Long term (current) use of anticoagulants: Secondary | ICD-10-CM | POA: Diagnosis not present

## 2018-04-11 DIAGNOSIS — C786 Secondary malignant neoplasm of retroperitoneum and peritoneum: Secondary | ICD-10-CM | POA: Insufficient documentation

## 2018-04-11 DIAGNOSIS — I480 Paroxysmal atrial fibrillation: Secondary | ICD-10-CM | POA: Insufficient documentation

## 2018-04-11 DIAGNOSIS — T451X5A Adverse effect of antineoplastic and immunosuppressive drugs, initial encounter: Secondary | ICD-10-CM | POA: Insufficient documentation

## 2018-04-11 DIAGNOSIS — Z7189 Other specified counseling: Secondary | ICD-10-CM

## 2018-04-11 DIAGNOSIS — Z5111 Encounter for antineoplastic chemotherapy: Secondary | ICD-10-CM | POA: Insufficient documentation

## 2018-04-11 DIAGNOSIS — Z90722 Acquired absence of ovaries, bilateral: Secondary | ICD-10-CM | POA: Insufficient documentation

## 2018-04-11 DIAGNOSIS — I10 Essential (primary) hypertension: Secondary | ICD-10-CM

## 2018-04-11 NOTE — Telephone Encounter (Signed)
Please review refill request 

## 2018-04-11 NOTE — Patient Instructions (Signed)
You have healed adequately to get started back on chemotherapy. Dr Denman George is recommending 3 more doses of chemotherapy.  She will see you back after completing therapy.

## 2018-04-12 ENCOUNTER — Encounter: Payer: Self-pay | Admitting: Hematology and Oncology

## 2018-04-13 ENCOUNTER — Ambulatory Visit: Payer: Self-pay | Admitting: Pharmacist

## 2018-04-13 ENCOUNTER — Other Ambulatory Visit: Payer: Self-pay

## 2018-04-13 ENCOUNTER — Other Ambulatory Visit: Payer: Self-pay | Admitting: Pharmacist

## 2018-04-13 ENCOUNTER — Telehealth: Payer: Self-pay | Admitting: Oncology

## 2018-04-13 NOTE — Telephone Encounter (Signed)
Called Kamarii back and advised her to try ferrous sulfate 325 mg tablets (available over the counter) BID per [name], NP.  If she tolerates them, then she go up to 3 a day.  She asked if she could keep taking Miralax once a day and then take 2 Senokot at bedtime.  Advised her that should be fine.

## 2018-04-13 NOTE — Patient Outreach (Signed)
[facility]) Care Management  04/13/2018  [place] 09-27-1945 [id number]  Successful care coordination call to Haley [name] with HIPAA identifiers.  Patient states she is having 24/7 severe back pain. She has called her navigator with Dr. Denman George for guidance.  She reports 2 BMs yesterday.  None yet today.  She thinks that pain could be coming from surgery or constipation.  She has taken fiber this AM.  She has been taking a cocktail of laxatives, etc.  She reports taking 3 ibuprofen with no relief, however she did get relief with 0.5 tablet of oxycodone.  Patient would like to apply for Xarelto patient assistance.  She has only met $200 (out of pocket) and needs to meet $900 to qualify for patient assistance program.  I will continue to follow patient to apply as able.  Patient is able to pay $30/month copay for now.  PLAN: -I will f/u with patient next month   Regina Eck, PharmD, [place]  [phone]

## 2018-04-13 NOTE — Telephone Encounter (Signed)
Haley [name] called and said that she is having constant lower back pain that began when she started taking iron tablets.  She said the iron is causing constipation.  She did have 2 bowel movements yesterday although they were painful and the stools were black.  She denies having any nausea.  She is taking Senokot and Miralax. For the pain she is alternating ibuprofen and tylenol which does not help. She did take an oxycodone tablet which does help.  She is wondering what can be done to stop the pain.

## 2018-04-16 ENCOUNTER — Encounter: Payer: PPO | Admitting: Genetic Counselor

## 2018-04-16 ENCOUNTER — Other Ambulatory Visit: Payer: PPO

## 2018-04-16 ENCOUNTER — Telehealth: Payer: Self-pay | Admitting: Hematology and Oncology

## 2018-04-16 NOTE — Telephone Encounter (Signed)
Spoke with patient re next appointment for 4/10. Patient to get updated schedule at next visit.

## 2018-04-16 NOTE — Telephone Encounter (Signed)
Called Haley [name] to see how she is feeling.  She said she is still having back pain, relieved by pain medication.  She is taking the ferrous sulfate BID and is not feeling as constipated.  She is also taking Miralax daily and colace.  Advised her that she will be contacted by scheduling tomorrow or Wednesday for her chemotherapy appointment on 04/20/18.

## 2018-04-17 ENCOUNTER — Telehealth: Payer: Self-pay | Admitting: Physician Assistant

## 2018-04-17 NOTE — Telephone Encounter (Signed)
   TELEPHONE CALL NOTE - RESCHEDULING OPV CANCELLATIONS DUE TO COVID-19  Primary Cardiologist:Thomas Claiborne Billings, MD  This patient has been deemed a candidate for a follow-up tele-health visit to limit community exposure during the Covid-19 pandemic. I have sent instructions to the patient's MyChart (dotphrase [email or web address]).   Please schedule pt for a video telehealth appointment with Dr. Claiborne Billings or an APP on his team in the next 2 weeks  I will forward this appointment request to the appropriate scheduling pool and the primary cardiologist's assist. I will remove this patient from the C19 cancellation pool.   Ledora Bottcher, PA 04/17/2018, 12:34 PM

## 2018-04-18 ENCOUNTER — Other Ambulatory Visit: Payer: Self-pay | Admitting: Family Medicine

## 2018-04-18 NOTE — Telephone Encounter (Signed)
Requested medication (s) are due for refill today: Yes  Requested medication (s) are on the active medication list: Yes  Last refill:  By historical provider  Future visit scheduled: No  Notes to clinic:  Unable to refill per protocol, last filled by another provider     Requested Prescriptions  Pending Prescriptions Disp Refills   omeprazole (PRILOSEC) 40 MG capsule 90 capsule 3    Sig: Take 1 capsule (40 mg total) by mouth daily.     Gastroenterology: Proton Pump Inhibitors Passed - 04/18/2018  4:08 PM      Passed - Valid encounter within last 12 months    Recent Outpatient Visits          1 month ago Rectal bleed   Therapist, music at [facility], Malka So, MD   2 months ago Gastroesophageal reflux disease, esophagitis presence not specified   Therapist, [facility] at [facility], Malka So, MD   4 months ago Flank pain   Therapist, music at [facility], Rayford Halsted, MD   5 months ago Routine general medical examination at a health care facility   [employer] at [facility], Malka So, MD   6 months ago Bad odor of urine   [employer] at [facility], Malka So, MD            ondansetron (ZOFRAN) 8 MG tablet 90 tablet 3    Sig: Take 1 tablet (8 mg total) by mouth every 8 (eight) hours as needed for nausea.     Not Delegated - Gastroenterology: Antiemetics Failed - 04/18/2018  4:08 PM      Failed - This refill cannot be delegated      Passed - Valid encounter within last 6 months    Recent Outpatient Visits          1 month ago Rectal bleed   [place] at [facility], Malka So, MD   2 months ago Gastroesophageal reflux disease, esophagitis presence not specified   Therapist, music at [facility], Malka So, MD   4 months ago Flank pain   Therapist, music at [facility], Rayford Halsted, MD   5 months ago Routine general medical examination at a health care facility   [facility] at  [facility], Malka So, MD   6 months ago Bad odor of urine   [employer] at [facility], Malka So, MD

## 2018-04-19 DIAGNOSIS — C561 Malignant neoplasm of right ovary: Secondary | ICD-10-CM | POA: Diagnosis not present

## 2018-04-19 MED ORDER — OMEPRAZOLE 40 MG PO CPDR: 40.0000 mg | DELAYED_RELEASE_CAPSULE | Freq: Every day | ORAL | 3 refills | Status: DC

## 2018-04-19 MED ORDER — ONDANSETRON HCL 8 MG PO TABS: 8.0000 mg | ORAL_TABLET | Freq: Three times a day (TID) | ORAL | 3 refills | Status: DC | PRN

## 2018-04-19 NOTE — Telephone Encounter (Signed)
Checking with Almyra Deforest, PA if able to see this patient in 2 weeks.

## 2018-04-20 ENCOUNTER — Telehealth: Payer: Self-pay | Admitting: Family Medicine

## 2018-04-20 ENCOUNTER — Other Ambulatory Visit: Payer: Self-pay

## 2018-04-20 ENCOUNTER — Inpatient Hospital Stay: Payer: PPO

## 2018-04-20 ENCOUNTER — Encounter: Payer: Self-pay | Admitting: Hematology and Oncology

## 2018-04-20 ENCOUNTER — Inpatient Hospital Stay (HOSPITAL_BASED_OUTPATIENT_CLINIC_OR_DEPARTMENT_OTHER): Payer: PPO | Admitting: Hematology and Oncology

## 2018-04-20 DIAGNOSIS — Z9884 Bariatric surgery status: Secondary | ICD-10-CM | POA: Diagnosis not present

## 2018-04-20 DIAGNOSIS — C561 Malignant neoplasm of right ovary: Secondary | ICD-10-CM

## 2018-04-20 DIAGNOSIS — T451X5A Adverse effect of antineoplastic and immunosuppressive drugs, initial encounter: Secondary | ICD-10-CM | POA: Diagnosis not present

## 2018-04-20 DIAGNOSIS — K449 Diaphragmatic hernia without obstruction or gangrene: Secondary | ICD-10-CM | POA: Diagnosis not present

## 2018-04-20 DIAGNOSIS — I480 Paroxysmal atrial fibrillation: Secondary | ICD-10-CM | POA: Diagnosis not present

## 2018-04-20 DIAGNOSIS — Z7901 Long term (current) use of anticoagulants: Secondary | ICD-10-CM

## 2018-04-20 DIAGNOSIS — C786 Secondary malignant neoplasm of retroperitoneum and peritoneum: Secondary | ICD-10-CM

## 2018-04-20 DIAGNOSIS — G620 Drug-induced polyneuropathy: Secondary | ICD-10-CM | POA: Insufficient documentation

## 2018-04-20 DIAGNOSIS — C569 Malignant neoplasm of unspecified ovary: Secondary | ICD-10-CM

## 2018-04-20 DIAGNOSIS — Z90722 Acquired absence of ovaries, bilateral: Secondary | ICD-10-CM | POA: Diagnosis not present

## 2018-04-20 DIAGNOSIS — D6481 Anemia due to antineoplastic chemotherapy: Secondary | ICD-10-CM

## 2018-04-20 DIAGNOSIS — M79606 Pain in leg, unspecified: Secondary | ICD-10-CM

## 2018-04-20 DIAGNOSIS — Z79899 Other long term (current) drug therapy: Secondary | ICD-10-CM | POA: Diagnosis not present

## 2018-04-20 DIAGNOSIS — K224 Dyskinesia of esophagus: Secondary | ICD-10-CM | POA: Diagnosis not present

## 2018-04-20 DIAGNOSIS — Z5111 Encounter for antineoplastic chemotherapy: Secondary | ICD-10-CM | POA: Diagnosis not present

## 2018-04-20 LAB — CBC WITH DIFFERENTIAL (CANCER CENTER ONLY)
Abs Immature Granulocytes: 0.02 10*3/uL (ref 0.00–0.07)
Basophils Absolute: 0 10*3/uL (ref 0.0–0.1)
Basophils Relative: 1 %
Eosinophils Absolute: 0 10*3/uL (ref 0.0–0.5)
Eosinophils Relative: 0 %
HCT: 35.8 % — ABNORMAL LOW (ref 36.0–46.0)
Hemoglobin: 11.5 g/dL — ABNORMAL LOW (ref 12.0–15.0)
Immature Granulocytes: 0 %
Lymphocytes Relative: 16 %
Lymphs Abs: 0.8 10*3/uL (ref 0.7–4.0)
MCH: 29.8 pg (ref 26.0–34.0)
MCHC: 32.1 g/dL (ref 30.0–36.0)
MCV: 92.7 fL (ref 80.0–100.0)
Monocytes Absolute: 0.1 10*3/uL (ref 0.1–1.0)
Monocytes Relative: 1 %
Neutro Abs: 4.4 10*3/uL (ref 1.7–7.7)
Neutrophils Relative %: 82 %
Platelet Count: 218 10*3/uL (ref 150–400)
RBC: 3.86 MIL/uL — ABNORMAL LOW (ref 3.87–5.11)
RDW: 15.8 % — ABNORMAL HIGH (ref 11.5–15.5)
WBC Count: 5.4 10*3/uL (ref 4.0–10.5)
nRBC: 0 % (ref 0.0–0.2)

## 2018-04-20 LAB — CMP (CANCER CENTER ONLY)
ALT: 9 U/L (ref 0–44)
AST: 14 U/L — ABNORMAL LOW (ref 15–41)
Albumin: 3.6 g/dL (ref 3.5–5.0)
Alkaline Phosphatase: 68 U/L (ref 38–126)
Anion gap: 12 (ref 5–15)
BUN: 14 mg/dL (ref 8–23)
CO2: 21 mmol/L — ABNORMAL LOW (ref 22–32)
Calcium: 9.1 mg/dL (ref 8.9–10.3)
Chloride: 104 mmol/L (ref 98–111)
Creatinine: 0.69 mg/dL (ref 0.44–1.00)
GFR, Est AFR Am: >60 mL/min (ref >60)
GFR, Estimated: >60 mL/min (ref >60)
Glucose, Bld: 147 mg/dL — ABNORMAL HIGH (ref 70–99)
Potassium: 3.9 mmol/L (ref 3.5–5.1)
Sodium: 137 mmol/L (ref 135–145)
Total Bilirubin: 0.3 mg/dL (ref 0.3–1.2)
Total Protein: 7.5 g/dL (ref 6.5–8.1)

## 2018-04-20 MED ORDER — SODIUM CHLORIDE 0.9 % IV SOLN
402.0000 mg | Freq: Once | INTRAVENOUS | Status: AC
  Administered 2018-04-20: 400 mg via INTRAVENOUS
  Filled 2018-04-20: qty 40

## 2018-04-20 MED ORDER — SODIUM CHLORIDE 0.9 % IV SOLN
20.0000 mg | Freq: Once | INTRAVENOUS | Status: AC
  Administered 2018-04-20: 20 mg via INTRAVENOUS
  Filled 2018-04-20: qty 2

## 2018-04-20 MED ORDER — SODIUM CHLORIDE 0.9 % IV SOLN
Freq: Once | INTRAVENOUS | Status: AC
  Administered 2018-04-20: 10:00:00 via INTRAVENOUS
  Filled 2018-04-20: qty 250

## 2018-04-20 MED ORDER — FAMOTIDINE IN NACL 20-0.9 MG/50ML-% IV SOLN: 20.0000 mg | Freq: Once | INTRAVENOUS | Status: DC

## 2018-04-20 MED ORDER — DIPHENHYDRAMINE HCL 50 MG/ML IJ SOLN
12.5000 mg | Freq: Once | INTRAMUSCULAR | Status: AC
  Administered 2018-04-20: 11:00:00 12.5 mg via INTRAVENOUS

## 2018-04-20 MED ORDER — OXYCODONE HCL 5 MG PO TABS: 5.0000 mg | ORAL_TABLET | Freq: Four times a day (QID) | ORAL | 0 refills | Status: DC | PRN

## 2018-04-20 MED ORDER — DIPHENHYDRAMINE HCL 50 MG/ML IJ SOLN
INTRAMUSCULAR | Status: AC
  Filled 2018-04-20: qty 1

## 2018-04-20 MED ORDER — SODIUM CHLORIDE 0.9 % IV SOLN
Freq: Once | INTRAVENOUS | Status: AC
  Administered 2018-04-20: 11:00:00 via INTRAVENOUS
  Filled 2018-04-20: qty 5

## 2018-04-20 MED ORDER — PALONOSETRON HCL INJECTION 0.25 MG/5ML
0.2500 mg | Freq: Once | INTRAVENOUS | Status: AC
  Administered 2018-04-20: 0.25 mg via INTRAVENOUS

## 2018-04-20 MED ORDER — SODIUM CHLORIDE 0.9 % IV SOLN
131.2500 mg/m2 | Freq: Once | INTRAVENOUS | Status: AC
  Administered 2018-04-20: 13:00:00 204 mg via INTRAVENOUS
  Filled 2018-04-20: qty 34

## 2018-04-20 MED ORDER — SODIUM CHLORIDE 0.9% FLUSH
10.0000 mL | INTRAVENOUS | Status: DC | PRN
  Administered 2018-04-20: 10 mL
  Filled 2018-04-20: qty 10

## 2018-04-20 MED ORDER — SODIUM CHLORIDE 0.9% FLUSH
10.0000 mL | Freq: Once | INTRAVENOUS | Status: AC
  Administered 2018-04-20: 10 mL
  Filled 2018-04-20: qty 10

## 2018-04-20 MED ORDER — HEPARIN SOD (PORK) LOCK FLUSH 100 UNIT/ML IV SOLN
500.0000 [IU] | Freq: Once | INTRAVENOUS | Status: AC | PRN
  Administered 2018-04-20: 17:00:00 500 [IU]
  Filled 2018-04-20: qty 5

## 2018-04-20 MED ORDER — PALONOSETRON HCL INJECTION 0.25 MG/5ML
INTRAVENOUS | Status: AC
  Filled 2018-04-20: qty 5

## 2018-04-20 NOTE — Assessment & Plan Note (Signed)
She has intermittent leg pain and back pain secondary to degenerative arthritis I recommend judicious use of pain medicine and she is aware that I am only giving her prescription pain medicine until the end of treatment

## 2018-04-20 NOTE — Patient Instructions (Signed)
[facility] Discharge Instructions for Patients Receiving Chemotherapy  Today you received the following chemotherapy agents Taxol, Carboplatin  To help prevent nausea and vomiting after your treatment, we encourage you to take your nausea medication as directed  If you develop nausea and vomiting that is not controlled by your nausea medication, call the clinic.   BELOW ARE SYMPTOMS THAT SHOULD BE REPORTED IMMEDIATELY:  *FEVER GREATER THAN 100.5 F  *CHILLS WITH OR WITHOUT FEVER  NAUSEA AND VOMITING THAT IS NOT CONTROLLED WITH YOUR NAUSEA MEDICATION  *UNUSUAL SHORTNESS OF BREATH  *UNUSUAL BRUISING OR BLEEDING  TENDERNESS IN MOUTH AND THROAT WITH OR WITHOUT PRESENCE OF ULCERS  *URINARY PROBLEMS  *BOWEL PROBLEMS  UNUSUAL RASH Items with * indicate a potential emergency and should be followed up as soon as possible.  Feel free to call the clinic should you have any questions or concerns. The clinic phone number is [phone].  Please show the CHEMO ALERT CARD at check-in to the Emergency Department and triage nurse.   

## 2018-04-20 NOTE — Assessment & Plan Note (Signed)
This is likely anemia of chronic disease. The patient denies recent history of bleeding such as epistaxis, hematuria or hematochezia. She is asymptomatic from the anemia. We will observe for now.  

## 2018-04-20 NOTE — Assessment & Plan Note (Signed)
She is currently in normal sinus rhythm

## 2018-04-20 NOTE — Progress Notes (Signed)
[facility] OFFICE PROGRESS NOTE  Patient Care Team: Martinique, Avonna G, MD as PCP - General (Family Medicine) Troy Sine, MD as PCP - Cardiology (Cardiology) Lavera Guise, [facility] as [place] Management (Pharmacist) Neldon Labella, RN as [place] Management Hess, Craige Cotta, RN as Oncology Nurse Navigator (Oncology)  ASSESSMENT & PLAN:  Right ovarian epithelial cancer [facility]) I have reviewed her surgical report and pathology report Her surgical incision is well-healed We will proceed with cycle 4 of chemotherapy with mild dose adjustment of paclitaxel due to neuropathy I plan to complete 6 cycles of chemotherapy before repeat imaging studies  Peripheral neuropathy due to chemotherapy [facility]) she has mild peripheral neuropathy, likely related to side effects of treatment. I plan to reduce the dose of treatment as outlined above.  I explained to the patient the rationale of this strategy and reassured the patient it would not compromise the efficacy of treatment   Lower extremity pain She has intermittent leg pain and back pain secondary to degenerative arthritis I recommend judicious use of pain medicine and she is aware that I am only giving her prescription pain medicine until the end of treatment  Anemia due to antineoplastic chemotherapy This is likely anemia of chronic disease. The patient denies recent history of bleeding such as epistaxis, hematuria or hematochezia. She is asymptomatic from the anemia. We will observe for now.   PAF (paroxysmal atrial fibrillation) ([place]) She is currently in normal sinus rhythm   No orders of the defined types were placed in this encounter.   INTERVAL HISTORY: Please see below for problem oriented charting. She returns to resume chemotherapy after recent surgery Her wound is healing well She is having a lot of back pain and lower extremity pain She has some mild peripheral neuropathy  from last cycle of treatment She has some mild intermittent constipation, resolved with laxative The patient denies any recent signs or symptoms of bleeding such as spontaneous epistaxis, hematuria or hematochezia. Denies recent infection, fever or chills  SUMMARY OF ONCOLOGIC HISTORY:   Right ovarian epithelial cancer ([place])   01/06/2018 Imaging    Ct abdomen and pelvis 1. Complex cystic mass at the right adnexa, measuring 5.9 x 4.0 cm, with nodular components, concerning for primary ovarian malignancy. 2. Diffuse nodularity along the omentum at the left side of the abdomen, extending into the mesentery at the left mid abdomen, concerning for peritoneal carcinomatosis. 3. Wall thickening at the distal ileum adjacent to the ovarian mass; bowel loops appear somewhat adherent to the ovarian mass. Bowel infiltration with tumor cannot be excluded. No evidence of bowel obstruction at this time. 4. Small volume ascites within the abdomen and pelvis.  Aortic Atherosclerosis (ICD10-I70.0).    01/08/2018 Tumor Marker    Patient's tumor was tested for the following markers: CA-125. Results of the tumor marker test revealed 3004    01/15/2018 Imaging    Chest CT:  1. No active cardiopulmonary disease. 2. Aortic atherosclerosis without aneurysm or dissection. 3. No large central pulmonary embolus.  CT AP:  1. Dilated fluid-filled loops of small bowel are redemonstrated slightly more extensive than on prior exam with transition point likely in the right adnexa adjacent to a complex cystic mass concerning for ovarian neoplasm given septations and soft tissue nodularity. This raises concern for early or partial SBO. This soft tissue mass measures 4.9 x 4 x 4.7 cm and has not changed since prior recent comparison. Additional short segmental area  of luminal narrowing is noted in the right lower quadrant involving small bowel for which stigmata of peritoneal carcinomatosis or small-bowel metastatic  implants might account for this. 2. Redemonstration of small volume of ascites predominantly in the upper abdomen surrounding the liver and spleen. 3. Redemonstration of thick bandlike omental thickening concerning for peritoneal carcinomatosis.     01/15/2018 Procedure    Successful ultrasound-guided diagnostic and therapeutic paracentesis yielding 1.5 liters of peritoneal fluid.     01/15/2018 Pathology Results    PERITONEAL/ASCITIC FLUID (SPECIMEN 1 OF 1 COLLECTED 01/18/18): MALIGNANT CELLS CONSISTENT WITH METASTATIC ADENOCARCINOMA. SEE COMMENT. COMMENT: THE MALIGNANT CELLS ARE POSITIVE FOR MOC-31, CYTOKERATIN 7, ESTROGEN RECEPTOR, PAX-8, AND WT-1. THEY ARE NEGATIVE FOR CALRETININ, CYTOKERATIN 5/6, AND CYTOKERATIN 20. THE PROFILE IS CONSISTENT WITH A PRIMARY GYNECOLOGIC CARCINOMA. THERE IS LIKELY SUFFICIENT TUMOR PRESENT, IF ADDITIONAL STUDIES ARE REQUESTED.    01/15/2018 - 02/02/2018 Hospital Admission    She was admitted to the hospital for SBO. She was treated with chemotherapy    01/18/2018 -  Chemotherapy    The patient had carboplatin and taxol    02/08/2018 Cancer Staging    Staging form: Ovary, Fallopian Tube, and Primary Peritoneal Carcinoma, AJCC 8th Edition - Clinical: cT3, cN0, cM0 - Signed by Heath Lark, MD on 02/08/2018    02/08/2018 Tumor Marker    Patient's tumor was tested for the following markers: CA-125. Results of the tumor marker test revealed 445    03/02/2018 Tumor Marker    Patient's tumor was tested for the following markers: CA-125. Results of the tumor marker test revealed 174    03/15/2018 Imaging    6.2 cm complex cystic right ovarian mass, compatible with malignant ovarian neoplasm, mildly progressive.  Associated peritoneal disease/omental caking beneath the anterior abdominal wall, mildly improved. Prior abdominal ascites is improved/resolved.  4.7 cm cystic left ovarian mass, without overt malignant features, grossly unchanged.  Mild bilateral  hydroureteronephrosis, secondary to extrinsic compression, new.  Prior small bowel obstruction has improved/resolved.     03/27/2018 Pathology Results    1. Omentum, resection for tumor - OMENTUM: - HIGH GRADE SEROUS CARCINOMA. 2. Ovary, left - LEFT OVARY: - HIGH GRADE SEROUS CARCINOMA. - LEFT FALLOPIAN TUBE: - HIGH GRADE SEROUS CARCINOMA. 3. Adnexa - ovary +/- tube, neoplastic, right - RIGHT OVARY: - HIGH GRADE SEROUS CARCINOMA, 5.5 CM. - RIGHT FALLOPIAN TUBE: - HIGH GRADE SEROUS CARCINOMA. 4. Peritoneum, biopsy, nodule - PERITONEUM: - NODULES OF HIGH GRADE SEROUS CARCINOMA. 5. Peritoneum, resection for tumor, rectosigmoid - RECTOSIGMOID PERITONEUM: - HIGH GRADE SEROUS CARCINOMA. Microscopic Comment 3. OVARY or FALLOPIAN TUBE or PRIMARY PERITONEUM: Procedure: Bilateral salpingo-oophorectomy, omentectomy and peritoneal biopsies. Specimen Integrity: N/A. Tumor Site: Right ovary. Ovarian Surface Involvement (required only if applicable): Yes. Fallopian Tube Surface Involvement (required only if applicable): Yes. Tumor Size: 5.5 x 4.8 x 4.0 cm. Histologic Type: Serous carcinoma. Histologic Grade: High grade. Implants (required for advanced stage serous/seromucinous borderline tumors only): Omentum, left ovary and fallopian tube, rectosigmoid peritoneum and peritoneum. Other Tissue/ Organ Involvement: Left ovary and fallopian tube, omentum and rectosigmoid peritoneum. Largest Extrapelvic Peritoneal Focus (required only if applicable): 16.1 cm, omentum. Peritoneal/Ascitic Fluid: N/A. Treatment Effect (required only for high-grade serous carcinomas): Minimal. Regional Lymph Nodes: No lymph nodes submitted. Pathologic Stage Classification (pTNM, AJCC 8th Edition): pT3c, pNX. Representative Tumor Block: 3A, 3B, 3D, 3E and 45F. Comment(s): There is a 5.5 cm in greatest dimension partially cystic high grade serous carcinoma involving the right adnexal specimen and the tumor is  staged  as a primary right ovarian carcinoma. The carcinoma also involves the left ovary as well as the left fallopian tube, the omentum, peritoneum and the rectosigmoid peritoneal specimens.    03/27/2018 Surgery    Preoperative Diagnosis: ovarian cancer, stage IIIC, metastatic to omentum, peritoneum, serosa of intestines   Procedure(s) Performed: 1. Exploratory laparotomy with bilateral salpingo-oophorectomy, omentectomy radical tumor debulking for ovarian cancer .  Surgeon: Thereasa Solo, MD.   Specimens: Bilateral tubes / ovaries, omentum. Peritoneal nodules, rectosigmoid nodules.    Operative Findings: omental cake, miliary studding on diaphragm and bilateral paracolic gutters. Sigmoid colon densely adherent to left and right ovaries with tumor rind, ureters mildly dilated bilaterally due to retroperitoneal extension of the tumor towards ureters causing compression.    This represented an optimal cytoreduction (R1) with gross residual disease on the diaphragm that had been ablated and a thin tumor plaque on the sigmoid colon that was ablated. No residual disease >1cm remaining     REVIEW OF SYSTEMS:   Constitutional: Denies fevers, chills or abnormal weight loss Eyes: Denies blurriness of vision Ears, nose, mouth, throat, and face: Denies mucositis or sore throat Respiratory: Denies cough, dyspnea or wheezes Cardiovascular: Denies palpitation, chest discomfort or lower extremity swelling Skin: Denies abnormal skin rashes Lymphatics: Denies new lymphadenopathy or easy bruising Behavioral/Psych: Mood is stable, no new changes  All other systems were reviewed with the patient and are negative.  I have reviewed the past medical history, past surgical history, social history and family history with the patient and they are unchanged from previous note.  ALLERGIES:  is allergic to tramadol hcl.  MEDICATIONS:  Current Outpatient Medications  Medication Sig Dispense Refill  . acetaminophen  (TYLENOL) 500 MG tablet Take 2 tablets (1,000 mg total) by mouth every 6 (six) hours. (Patient taking differently: Take 1,000 mg by mouth every 6 (six) hours as needed. ) 30 tablet 0  . ALPRAZolam (XANAX) 0.5 MG tablet Take 0.5-1 tablets (0.25-0.5 mg total) by mouth daily as needed for anxiety. 30 tablet 2  . alum & mag hydroxide-simeth (MAALOX/MYLANTA) 200-200-20 MG/5ML suspension Take 30 mLs by mouth every 6 (six) hours as needed for indigestion or heartburn. 355 mL 0  . Calcium Carbonate (CALTRATE 600) 1500 MG TABS Take 600 mg of elemental calcium by mouth daily.     . Carboxymethylcellulose Sodium (EYE DROPS OP) Place 1 drop into both eyes daily as needed (dry eyes).     . cholecalciferol (VITAMIN D) 1000 UNITS tablet Take 1,000 Units by mouth daily.     Marland Kitchen dexamethasone (DECADRON) 4 MG tablet Take 2 tabs at the night before and 2 tabs the morning of chemotherapy, every 3 weeks, by mouth 20 tablet 0  . enoxaparin (LOVENOX) 40 MG/0.4ML injection Inject 0.4 mLs (40 mg total) into the skin daily. 4 Syringe 0  . ferrous gluconate (FERGON) 324 MG tablet Take 1 tablet (324 mg total) by mouth 3 (three) times daily with meals. 90 tablet 3  . fluticasone (FLONASE) 50 MCG/ACT nasal spray Use 2 sprays in each nostril daily (Patient taking differently: Place 2 sprays into both nostrils daily. ) 48 g 2  . hydrocortisone (ANUSOL-HC) 25 MG suppository Place 1 suppository (25 mg total) rectally 2 (two) times daily as needed for hemorrhoids or anal itching. 12 suppository 1  . ibuprofen (ADVIL,MOTRIN) 200 MG tablet Take 600 mg by mouth every 6 (six) hours as needed for moderate pain.     Marland Kitchen lactose free nutrition (BOOST PLUS)  LIQD Take 237 mLs by mouth daily. (Patient taking differently: Take 237 mLs by mouth 2 (two) times daily between meals. ) 10 Can 0  . levothyroxine (SYNTHROID, LEVOTHROID) 75 MCG tablet Take 1 tablet (75 mcg total) by mouth daily. (Patient taking differently: Take 75 mcg by mouth daily before  breakfast. ) 90 tablet 3  . lidocaine-prilocaine (EMLA) cream Apply 1 application topically as needed. 30 g 6  . Magnesium 400 MG TABS Take 250 mg by mouth daily.    . Menthol-Methyl Salicylate (MUSCLE RUB) 10-15 % CREA Apply 1 application topically as needed for muscle pain.    . metoprolol succinate (TOPROL-XL) 50 MG 24 hr tablet Take 1 tablet (50 mg total) by mouth daily. Take with or immediately following a meal. 90 tablet 3  . Multiple Vitamins-Minerals (MULTIVITAMIN WITH MINERALS) tablet Take 1 tablet by mouth daily.      . Omega-3 Fatty Acids (FISH OIL) 1000 MG CAPS Take 2,000 mg by mouth 2 (two) times daily.     Marland Kitchen omeprazole (PRILOSEC) 40 MG capsule Take 1 capsule (40 mg total) by mouth daily. 90 capsule 3  . ondansetron (ZOFRAN) 8 MG tablet Take 1 tablet (8 mg total) by mouth every 8 (eight) hours as needed for nausea. 90 tablet 3  . oxyCODONE (OXY IR/ROXICODONE) 5 MG immediate release tablet Take 1 tablet (5 mg total) by mouth every 6 (six) hours as needed for severe pain. 30 tablet 0  . polyethylene glycol (MIRALAX / GLYCOLAX) packet Take 17 g by mouth daily. (Patient taking differently: Take 17 g by mouth daily as needed for moderate constipation. ) 14 each 0  . prochlorperazine (COMPAZINE) 10 MG tablet Take 1 tablet (10 mg total) by mouth every 6 (six) hours as needed for nausea or vomiting. 60 tablet 11  . rivaroxaban (XARELTO) 20 MG TABS tablet Take 1 tablet (20 mg total) by mouth daily with supper. 14 tablet 0  . senna (SENOKOT) 8.6 MG TABS tablet Take 1 tablet (8.6 mg total) by mouth at bedtime as needed for mild constipation. 120 each 0  . senna-docusate (SENOKOT-S) 8.6-50 MG tablet Take 2 tablets by mouth at bedtime. For AFTER surgery 30 tablet 0  . simvastatin (ZOCOR) 20 MG tablet Take 1 tablet (20 mg total) by mouth at bedtime. 90 tablet 3  . vitamin C (ASCORBIC ACID) 500 MG tablet Take 1,000 mg by mouth daily.     No current facility-administered medications for this visit.     Facility-Administered Medications Ordered in Other Visits  Medication Dose Route Frequency Provider Last Rate Last Dose  . CARBOplatin (PARAPLATIN) 400 mg in sodium chloride 0.9 % 250 mL chemo infusion  400 mg Intravenous Once Alvy Bimler, Cleatis Fandrich, MD      . diphenhydrAMINE (BENADRYL) injection 12.5 mg  12.5 mg Intravenous Once Alvy Bimler, Madysun Thall, MD      . famotidine (PEPCID) 20 mg in sodium chloride 0.9 % 100 mL IVPB  20 mg Intravenous Once Alvy Bimler, Kimla Furth, MD      . fosaprepitant (EMEND) 150 mg, dexamethasone (DECADRON) 12 mg in sodium chloride 0.9 % 145 mL IVPB   Intravenous Once Alvy Bimler, Seham Gardenhire, MD      . heparin lock flush 100 unit/mL  500 Units Intracatheter Once PRN Alvy Bimler, Vale Mousseau, MD      . PACLitaxel (TAXOL) 204 mg in sodium chloride 0.9 % 250 mL chemo infusion (> 80mg /m2)  131.25 mg/m2 (Treatment Plan Adjusted) Intravenous Once Heath Lark, MD      . sodium  chloride flush (NS) 0.9 % injection 10 mL  10 mL Intracatheter PRN Alvy Bimler, Ji Feldner, MD        PHYSICAL EXAMINATION: ECOG PERFORMANCE STATUS: 1 - Symptomatic but completely ambulatory  Vitals:   04/20/18 0919  BP: (!) 142/84  Pulse: 64  Resp: 18  Temp: 98.3 F (36.8 C)  SpO2: 99%   Filed Weights   04/20/18 0919  Weight: 127 lb 3.2 oz (57.7 kg)    GENERAL:alert, no distress and comfortable SKIN: skin color, texture, turgor are normal, no rashes or significant lesions EYES: normal, Conjunctiva are pink and non-injected, sclera clear OROPHARYNX:no exudate, no erythema and lips, buccal mucosa, and tongue normal  NECK: supple, thyroid normal size, non-tender, without nodularity LYMPH:  no palpable lymphadenopathy in the cervical, axillary or inguinal LUNGS: clear to auscultation and percussion with normal breathing effort HEART: regular rate & rhythm and no murmurs and no lower extremity edema ABDOMEN:abdomen soft, non-tender and normal bowel sounds.  Noted well-healed surgical scar Musculoskeletal:no cyanosis of digits and no clubbing  NEURO:  alert & oriented x 3 with fluent speech, no focal motor/sensory deficits  LABORATORY DATA:  I have reviewed the data as listed    Component Value Date/Time   NA 137 04/20/2018 0900   K 3.9 04/20/2018 0900   CL 104 04/20/2018 0900   CO2 21 (L) 04/20/2018 0900   GLUCOSE 147 (H) 04/20/2018 0900   BUN 14 04/20/2018 0900   CREATININE 0.69 04/20/2018 0900   CREATININE 0.76 11/17/2015 1005   CALCIUM 9.1 04/20/2018 0900   PROT 7.5 04/20/2018 0900   ALBUMIN 3.6 04/20/2018 0900   AST 14 (L) 04/20/2018 0900   ALT 9 04/20/2018 0900   ALKPHOS 68 04/20/2018 0900   BILITOT 0.3 04/20/2018 0900   GFRNONAA >60 04/20/2018 0900   GFRAA >60 04/20/2018 0900    No results found for: SPEP, UPEP  Lab Results  Component Value Date   WBC 5.4 04/20/2018   NEUTROABS 4.4 04/20/2018   HGB 11.5 (L) 04/20/2018   HCT 35.8 (L) 04/20/2018   MCV 92.7 04/20/2018   PLT 218 04/20/2018      Chemistry      Component Value Date/Time   NA 137 04/20/2018 0900   K 3.9 04/20/2018 0900   CL 104 04/20/2018 0900   CO2 21 (L) 04/20/2018 0900   BUN 14 04/20/2018 0900   CREATININE 0.69 04/20/2018 0900   CREATININE 0.76 11/17/2015 1005      Component Value Date/Time   CALCIUM 9.1 04/20/2018 0900   ALKPHOS 68 04/20/2018 0900   AST 14 (L) 04/20/2018 0900   ALT 9 04/20/2018 0900   BILITOT 0.3 04/20/2018 0900     I have reviewed recent surgical report and pathology report  All questions were answered. The patient knows to call the clinic with any problems, questions or concerns. No barriers to learning was detected.  I spent 30 minutes counseling the patient face to face. The total time spent in the appointment was 40 minutes and more than 50% was on counseling and review of test results  Heath Lark, MD 04/20/2018 10:38 AM

## 2018-04-20 NOTE — Telephone Encounter (Signed)
Copied from [place] [id number]. Topic: Quick Communication - Rx Refill/Question >> Apr 20, 2018 11:32 AM Reyne Dumas L wrote: Medication:  ondansetron (ZOFRAN) 8 MG tablet  Jarrett Soho from [employer] calling:  They need clarification, wants to know if it is okay to dispense a 90 day supply and if so since it is a PRN medication how many tablets can they dispense. Jarrett Soho can be reached at [phone], OK to leave a message on that number with full first and last name.

## 2018-04-20 NOTE — Assessment & Plan Note (Signed)
I have reviewed her surgical report and pathology report Her surgical incision is well-healed We will proceed with cycle 4 of chemotherapy with mild dose adjustment of paclitaxel due to neuropathy I plan to complete 6 cycles of chemotherapy before repeat imaging studies

## 2018-04-20 NOTE — Assessment & Plan Note (Signed)
she has mild peripheral neuropathy, likely related to side effects of treatment. °I plan to reduce the dose of treatment as outlined above.  °I explained to the patient the rationale of this strategy and reassured the patient it would not compromise the efficacy of treatment ° °

## 2018-04-23 ENCOUNTER — Telehealth: Payer: Self-pay | Admitting: Cardiovascular Disease

## 2018-04-23 ENCOUNTER — Ambulatory Visit: Payer: Self-pay | Admitting: Pharmacist

## 2018-04-23 NOTE — Telephone Encounter (Signed)
Patient scheduled with TK tomorrow

## 2018-04-23 NOTE — Telephone Encounter (Signed)
Smart phone/MyChart/pre reg complete. 04-23-18 ST The patient has consented to a telephone visit. 04-23-18 ST

## 2018-04-24 ENCOUNTER — Encounter: Payer: Self-pay | Admitting: Cardiovascular Disease

## 2018-04-24 ENCOUNTER — Telehealth (INDEPENDENT_AMBULATORY_CARE_PROVIDER_SITE_OTHER): Payer: PPO | Admitting: Cardiovascular Disease

## 2018-04-24 VITALS — BP 139/76 | HR 88 | Ht 62.0 in | Wt 129.0 lb

## 2018-04-24 DIAGNOSIS — K219 Gastro-esophageal reflux disease without esophagitis: Secondary | ICD-10-CM

## 2018-04-24 DIAGNOSIS — I10 Essential (primary) hypertension: Secondary | ICD-10-CM | POA: Diagnosis not present

## 2018-04-24 DIAGNOSIS — G4733 Obstructive sleep apnea (adult) (pediatric): Secondary | ICD-10-CM | POA: Diagnosis not present

## 2018-04-24 DIAGNOSIS — I700 Atherosclerosis of aorta: Secondary | ICD-10-CM | POA: Diagnosis not present

## 2018-04-24 DIAGNOSIS — C561 Malignant neoplasm of right ovary: Secondary | ICD-10-CM | POA: Diagnosis not present

## 2018-04-24 DIAGNOSIS — I480 Paroxysmal atrial fibrillation: Secondary | ICD-10-CM

## 2018-04-24 DIAGNOSIS — E039 Hypothyroidism, unspecified: Secondary | ICD-10-CM

## 2018-04-24 NOTE — Patient Instructions (Signed)
Medication Instructions:  The current medical regimen is effective;  continue present plan and medications.  If you need a refill on your cardiac medications before your next appointment, please call your pharmacy.    Follow-Up: At [facility], you and your health needs are our priority.  As part of our continuing mission to provide you with exceptional heart care, we have created designated Provider Care Teams.  These Care Teams include your primary Cardiologist (physician) and Advanced Practice Providers (APPs -  Physician Assistants and Nurse Practitioners) who all work together to provide you with the care you need, when you need it. You will need a follow up appointment in 4 months.  You may see Shelva Majestic, MD or one of the following Advanced Practice Providers on your designated Care Team: [place], [place] . Fabian Sharp, PA-C

## 2018-04-24 NOTE — Progress Notes (Signed)
Virtual Visit via Video Note   This visit type was conducted due to national recommendations for restrictions regarding the COVID-19 Pandemic (e.g. social distancing) in an effort to limit this patient's exposure and mitigate transmission in our community.  Due to her co-morbid illnesses, this patient is at least at moderate risk for complications without adequate follow up.  This format is felt to be most appropriate for this patient at this time.  All issues noted in this document were discussed and addressed.  A limited physical exam was performed with this format.  Please refer to the patient's chart for her consent to telehealth for [facility].  Consent obtained 04/17/2018  Evaluation Performed:  Follow-up visit   Date:  04/24/2018   ID:  Haley [name], DOB 1945/05/01, MRN [id number]  Patient Location:  [address] [place] [address]   Provider location:   [place]  PCP:  Martinique, Monifa G, MD  Cardiologist:  Shelva Majestic, MD  Electrophysiologist:  None   Chief Complaint: Cardiology follow-up evaluation of recent OB/GYN surgery for metastatic ovarian CA  History of Present Illness:    Haley [name] is a 73 y.o. female who presents via audio/video conferencing for a telehealth visit today.    The patient does not have symptoms concerning for COVID-19 infection (fever, chills, cough, or new SHORTNESS OF BREATH).   [name] is a 73 year old female who has a history of palpitations, paroxysmal atrial fibrillation, hyperlipidemia, GERD, as well as hypothyroidism.  She also has a history of sleep apnea, currently untreated.  Since I last saw her, she unfortunately was diagnosed with metastatic ovarian CA and has undergone extensive GYN and oncological evaluation. A preoperative chest CT did not show active cardiopulmonary disease and there was no evidence for large central pulmonary embolus.  She was noted to have aortic atherosclerosis without aneurysm or  dissection.  On March 27, 2018 she underwent an exploratory laparotomy with bilateral salpingo-oophorectomy, omentectomy, radical tumor debulking for ovarian cancer by Dr. Denman George. She is followed by Dr. Alvy Bimler And so far has had 2 cycles of chemotherapy.  She admits to being fatigued and weak following the treatments but ultimately improves.  Prior to her exploratory laparotomy surgery, she underwent a myocardial perfusion study which essentially was normal and did not show evidence for scar or ischemia.  Ejection fraction was 57%.  She was given operative clearance.  Presently, she feels well.  She denies any episodes of chest tightness or pressure.  She does admit to some fatigue.  She is unaware of any significant recent palpitations.  She states her blood pressure typically is running in the 130/70 range.  Her pulse typically is in the 60s to 70s.  She denies presyncope or syncope.  She denies significant swelling.  She admits to some abdominal discomfort.  She also has a history of recent neuropathy.  She presents for evaluation.  Prior CV studies:   The following studies were reviewed today: Recent evaluations with GYN, oncology,Luke Kilroy,Dr. Encarnacion Martinique, Preoperativemyocardial perfusion study  Nuclear Stress Findings   Isotope administration Rest isotope was administered  with an IV injection of 9.8 mCi Tc71m Tetrofosmin.  Rest SPECT images were obtained approximately 45 minutes post tracer injection.  Stress isotope was administered  with an IV injection of 27.0 mCi Tc81m Tetrofosmin   Stress SPECT images were obtained approximately 60 minutes post tracer injection.  Nuclear Study Quality Overall image quality is excellent.  Nuclear Measurements Study was gated.  Rest Perfusion Rest perfusion normal.  Stress Perfusion Stress perfusion normal.  Overall Study Impression Myocardial perfusion is normal.   The study is normal.   This is a low risk study.  Overall left ventricular systolic  function was normal.    LV cavity size is normal.  Nuclear stress EF:  57%.  The left ventricular ejection fraction is normal (55-65%).      Past Medical History:  Diagnosis Date   Allergy    SEASONAL   Anxiety    Cancer ([place])    ovarian cancer   Cataract    BILATERAL   Dysrhythmia    Family history of lung cancer    Family history of prostate cancer    Family history of prostate cancer    Family history of uterine cancer    Fibromyalgia    GERD (gastroesophageal reflux disease)    H/O echocardiogram 04/28/08   EF>55% trace mitral regurgitation, No significant valvular pathology   Hemorrhoids    internal   History of stress test 02/08/2011   Normal Myocardial perfusion study, this is a low risk scan, No prior study available for comparison   Hyperlipidemia    Hypertension    hx of   Hypothyroidism    MVP (mitral valve prolapse)    mild and MR   OA (osteoarthritis)    Osteoporosis    Paroxysmal A-fib (HCC)    Sleep apnea    does not use prescribed CPAP  does not tolerate   Thyroid disease    HYPO   Varicosities of leg    Vitamin D deficiency    Past Surgical History:  Procedure Laterality Date   [place]   with hysterectomy   BILATERAL SALPINGECTOMY N/A 03/27/2018   Procedure: EXPLORATORY LAPARATOMY, BILATERAL SALPINGOOPHERECTOMY;  Surgeon: Everitt Amber, MD;  Location: [facility];  Service: Gynecology;  Laterality: N/A;   BREAST EXCISIONAL BIOPSY Left 1995   Benign   COLONOSCOPY  11-20-2000   DEBULKING N/A 03/27/2018   Procedure: RADICAL TUMOR DEBULKING;  Surgeon: Everitt Amber, MD;  Location: [facility];  Service: Gynecology;  Laterality: N/A;   IR IMAGING GUIDED PORT INSERTION  01/17/2018   NOSE SURGERY  1990   OMENTECTOMY N/A 03/27/2018   Procedure: OMENTECTOMY;  Surgeon: Everitt Amber, MD;  Location: [facility];  Service: Gynecology;  Laterality: N/A;     Current Meds  Medication Sig   acetaminophen  (TYLENOL) 500 MG tablet Take 2 tablets (1,000 mg total) by mouth every 6 (six) hours. (Patient taking differently: Take 1,000 mg by mouth every 6 (six) hours as needed. )   ALPRAZolam (XANAX) 0.5 MG tablet Take 0.5-1 tablets (0.25-0.5 mg total) by mouth daily as needed for anxiety.   alum & mag hydroxide-simeth (MAALOX/MYLANTA) 200-200-20 MG/5ML suspension Take 30 mLs by mouth every 6 (six) hours as needed for indigestion or heartburn.   Calcium Carbonate (CALTRATE 600) 1500 MG TABS Take 600 mg of elemental calcium by mouth daily.    Carboxymethylcellulose Sodium (EYE DROPS OP) Place 1 drop into both eyes daily as needed (dry eyes).    cholecalciferol (VITAMIN D) 1000 UNITS tablet Take 1,000 Units by mouth daily.    dexamethasone (DECADRON) 4 MG tablet Take 2 tabs at the night before and 2 tabs the morning of chemotherapy, every 3 weeks, by mouth   ferrous gluconate (FERGON) 324 MG tablet Take 1 tablet (324 mg total) by mouth 3 (three) times daily with meals.  fluticasone (FLONASE) 50 MCG/ACT nasal spray Use 2 sprays in each nostril daily (Patient taking differently: Place 2 sprays into both nostrils daily. )   hydrocortisone (ANUSOL-HC) 25 MG suppository Place 1 suppository (25 mg total) rectally 2 (two) times daily as needed for hemorrhoids or anal itching.   ibuprofen (ADVIL,MOTRIN) 200 MG tablet Take 600 mg by mouth every 6 (six) hours as needed for moderate pain.    lactose free nutrition (BOOST PLUS) LIQD Take 237 mLs by mouth daily. (Patient taking differently: Take 237 mLs by mouth 2 (two) times daily between meals. )   levothyroxine (SYNTHROID, LEVOTHROID) 75 MCG tablet Take 1 tablet (75 mcg total) by mouth daily. (Patient taking differently: Take 75 mcg by mouth daily before breakfast. )   lidocaine-prilocaine (EMLA) cream Apply 1 application topically as needed.   Magnesium 400 MG TABS Take 250 mg by mouth daily.   Menthol-Methyl Salicylate (MUSCLE RUB) 10-15 % CREA Apply 1  application topically as needed for muscle pain.   metoprolol succinate (TOPROL-XL) 50 MG 24 hr tablet Take 1 tablet (50 mg total) by mouth daily. Take with or immediately following a meal.   Multiple Vitamins-Minerals (MULTIVITAMIN WITH MINERALS) tablet Take 1 tablet by mouth daily.     Omega-3 Fatty Acids (FISH OIL) 1000 MG CAPS Take 2,000 mg by mouth 2 (two) times daily.    omeprazole (PRILOSEC) 40 MG capsule Take 1 capsule (40 mg total) by mouth daily.   ondansetron (ZOFRAN) 8 MG tablet Take 1 tablet (8 mg total) by mouth every 8 (eight) hours as needed for nausea.   oxyCODONE (OXY IR/ROXICODONE) 5 MG immediate release tablet Take 1 tablet (5 mg total) by mouth every 6 (six) hours as needed for severe pain.   polyethylene glycol (MIRALAX / GLYCOLAX) packet Take 17 g by mouth daily. (Patient taking differently: Take 17 g by mouth daily as needed for moderate constipation. )   prochlorperazine (COMPAZINE) 10 MG tablet Take 1 tablet (10 mg total) by mouth every 6 (six) hours as needed for nausea or vomiting.   rivaroxaban (XARELTO) 20 MG TABS tablet Take 1 tablet (20 mg total) by mouth daily with supper.   senna (SENOKOT) 8.6 MG TABS tablet Take 1 tablet (8.6 mg total) by mouth at bedtime as needed for mild constipation.   senna-docusate (SENOKOT-S) 8.6-50 MG tablet Take 2 tablets by mouth at bedtime. For AFTER surgery   simvastatin (ZOCOR) 20 MG tablet Take 1 tablet (20 mg total) by mouth at bedtime.   vitamin C (ASCORBIC ACID) 500 MG tablet Take 1,000 mg by mouth daily.     Allergies:   Tramadol hcl   Social History   Tobacco Use   Smoking status: Light Tobacco Smoker    Years: 10.00    Types: Cigars   Smokeless tobacco: Never Used   Tobacco comment: not daily  [place] cigars 1-2   Substance Use Topics   Alcohol use: No    Alcohol/week: 0.0 standard drinks   Drug use: No     Family Hx: The patient's family history includes Cancer in her paternal aunt; Diabetes  in her daughter and mother; Heart disease in her mother; Hypertension in her mother; Lung cancer in her niece; Prostate cancer in her paternal uncle; Stroke in her maternal grandmother; Uterine cancer in her cousin. There is no history of Colon cancer.  ROS:   Please see the history of present illness.    Recent fatigue.  Recent diagnosis of metastatic ovarian CA, status  post exploratory laparotomy with postoperative discomfort involving her back.  No significant leg swelling.  No chest pain.  No PND orthopnea.  She denies any recent rectal bleeding following surgery.  She believes she is sleeping well.  She denies snoring. All other systems reviewed and are negative.   Labs/Other Tests and Data Reviewed:    Recent Labs: 01/20/2018: TSH 0.461 01/29/2018: Magnesium 1.8 04/20/2018: ALT 9; BUN 14; Creatinine 0.69; Hemoglobin 11.5; Platelet Count 218; Potassium 3.9; Sodium 137   Recent Lipid Panel Lab Results  Component Value Date/Time   CHOL 157 11/03/2017 10:19 AM   TRIG 154.0 (H) 11/03/2017 10:19 AM   HDL 56.00 11/03/2017 10:19 AM   CHOLHDL 3 11/03/2017 10:19 AM   LDLCALC 70 11/03/2017 10:19 AM   LDLDIRECT 84.3 12/16/2010 09:21 AM    Wt Readings from Last 3 Encounters:  04/24/18 129 lb (58.5 kg)  04/20/18 127 lb 3.2 oz (57.7 kg)  04/11/18 128 lb 12.8 oz (58.4 kg)     Exam:    Vital Signs:  BP 139/76    Pulse 88    Ht 5\' 2"  (1.575 m)    Wt 129 lb (58.5 kg)    BMI 23.59 kg/m    During this examination I had a repeat her blood pressure which was 131/70 with a pulse of 76.  Well nourished, well developed female in no acute distress. She is wearing a hat over her head following her recent chemotherapy. Breathing is unlabored. There is no audible wheezing.  She denies any chest discomfort.  She denies any present abdominal pains.  There is no apparent leg swelling.  She has had issues with neuropathy.  There are no gross neurologic deficits.  She has normal cognition and  affect.  ASSESSMENT & PLAN:    1.  Essential hypertension: Currently she is on metoprolol succinate 50 mg daily.  Her blood pressure on repeat today is well controlled.  She states her pulse typically is in the 60s to 70s.  Her palpitations have been controlled.  She is unaware of any recurrent PAF.  2.  Hyperlipidemia: Currently on simvastatin 20 mg.  Most recent LDL cholesterol 70.  3: Metastatic ovarian CA, status post exploratory laparotomy March 27, 2018.  Operative report reviewed.  Currently undergoing chemotherapy by Dr. Alvy Bimler.  4.  Hypothyroidism, currently on levothyroxine 50 mcg.  5.  GERD: Currently controlled with omeprazole.  6.  Aortic atherosclerosis: We will continue simvastatin 20 mg.  7.  OSA: Sleeping well, not on treatment.  COVID-19 Education: The signs and symptoms of COVID-19 were discussed with the patient and how to seek care for testing (follow up with PCP or arrange E-visit).  The importance of social distancing was discussed today.  Patient Risk:   After full review of this patients clinical status, I feel that they are at least moderate risk at this time.  Time:   Today, I have spent 30 minutes with the patient with telehealth technology discussing .     Medication Adjustments/Labs and Tests Ordered: Current medicines are reviewed at length with the patient today.  Concerns regarding medicines are outlined above.  Tests Ordered: No orders of the defined types were placed in this encounter.  Medication Changes: No orders of the defined types were placed in this encounter.   Disposition:  4 months  Signed, Shelva Majestic, MD  04/24/2018 2:13 PM    [facility]

## 2018-04-24 NOTE — Telephone Encounter (Signed)
Zofran 8 mg  to take 1 tab bid prn, 180 tabs with a refill. Thanks, BJ

## 2018-04-25 NOTE — Telephone Encounter (Signed)
RN called Jarrett Soho at [facility] and left the message. Per Dr. Martinique Zofran 8mg  to take 1 tablet bid as needed, with a refill. Office number provided if anything further is needed.

## 2018-04-26 ENCOUNTER — Telehealth: Payer: Self-pay

## 2018-04-26 NOTE — Telephone Encounter (Signed)
She called and left a message to call her.  Called back. She wanted to review labs from 4/10.

## 2018-05-02 ENCOUNTER — Other Ambulatory Visit: Payer: Self-pay

## 2018-05-02 NOTE — Patient Outreach (Signed)
[place]) Care Management  05/02/2018  Tees Toh [birth year] [phone]   Successful outreach with Mrs. [place]. She continues to progress well. Reports feeling "much better" over the past few weeks. She remains compliant with medications and treatment recommendations. Reports tolerating small meals without difficulty. Reports ambulating well and performing ADLs independently. States her activity tolerance has increased since last outreach. Mrs. Schaffer reports compliance with recommended COVID-19 restrictions. Denies urgent concerns or changes in care management needs. Agreeable to follow up and case closure on next month.  PLAN Will follow up next month.   [place] [phone]

## 2018-05-08 ENCOUNTER — Encounter: Payer: Self-pay | Admitting: Licensed Clinical Social Worker

## 2018-05-08 ENCOUNTER — Telehealth: Payer: Self-pay | Admitting: Licensed Clinical Social Worker

## 2018-05-08 ENCOUNTER — Ambulatory Visit: Payer: Self-pay | Admitting: Licensed Clinical Social Worker

## 2018-05-08 DIAGNOSIS — Z801 Family history of malignant neoplasm of trachea, bronchus and lung: Secondary | ICD-10-CM

## 2018-05-08 DIAGNOSIS — Z1379 Encounter for other screening for genetic and chromosomal anomalies: Secondary | ICD-10-CM

## 2018-05-08 DIAGNOSIS — Z8042 Family history of malignant neoplasm of prostate: Secondary | ICD-10-CM

## 2018-05-08 DIAGNOSIS — Z8049 Family history of malignant neoplasm of other genital organs: Secondary | ICD-10-CM

## 2018-05-08 DIAGNOSIS — C561 Malignant neoplasm of right ovary: Secondary | ICD-10-CM

## 2018-05-08 NOTE — Telephone Encounter (Signed)
Revealed negative genetic testing (both germline and somatic). This normal result indicates that it is unlikely Haley [name]'s cancer is due to a hereditary cause.  It is unlikely that there is an increased risk of another cancer due to a mutation in one of these genes.  However, genetic testing is not perfect, and cannot definitively rule out a hereditary cause.  It will be important for her to keep in contact with genetics to learn if any additional testing may be needed in the future.

## 2018-05-08 NOTE — Progress Notes (Signed)
HPI:  [name] was previously seen in the [place] [facility] due to a personal and family history of cancer and concerns regarding a hereditary predisposition to cancer. Please refer to our prior cancer genetics clinic note for more information regarding our discussion, assessment and recommendations, at the time. [name] recent genetic test results were disclosed to her, as were recommendations warranted by these results. These results and recommendations are discussed in more detail below.  CANCER HISTORY:    Right ovarian epithelial cancer ([place])   01/06/2018 Imaging    Ct abdomen and pelvis 1. Complex cystic mass at the right adnexa, measuring 5.9 x 4.0 cm, with nodular components, concerning for primary ovarian malignancy. 2. Diffuse nodularity along the omentum at the left side of the abdomen, extending into the mesentery at the left mid abdomen, concerning for peritoneal carcinomatosis. 3. Wall thickening at the distal ileum adjacent to the ovarian mass; bowel loops appear somewhat adherent to the ovarian mass. Bowel infiltration with tumor cannot be excluded. No evidence of bowel obstruction at this time. 4. Small volume ascites within the abdomen and pelvis.  Aortic Atherosclerosis (ICD10-I70.0).    01/08/2018 Tumor Marker    Patient's tumor was tested for the following markers: CA-125. Results of the tumor marker test revealed 3004    01/15/2018 Imaging    Chest CT:  1. No active cardiopulmonary disease. 2. Aortic atherosclerosis without aneurysm or dissection. 3. No large central pulmonary embolus.  CT AP:  1. Dilated fluid-filled loops of small bowel are redemonstrated slightly more extensive than on prior exam with transition point likely in the right adnexa adjacent to a complex cystic mass concerning for ovarian neoplasm given septations and soft tissue nodularity. This raises concern for early or partial SBO. This soft tissue mass measures 4.9 x 4 x  4.7 cm and has not changed since prior recent comparison. Additional short segmental area of luminal narrowing is noted in the right lower quadrant involving small bowel for which stigmata of peritoneal carcinomatosis or small-bowel metastatic implants might account for this. 2. Redemonstration of small volume of ascites predominantly in the upper abdomen surrounding the liver and spleen. 3. Redemonstration of thick bandlike omental thickening concerning for peritoneal carcinomatosis.     01/15/2018 Procedure    Successful ultrasound-guided diagnostic and therapeutic paracentesis yielding 1.5 liters of peritoneal fluid.     01/15/2018 Pathology Results    PERITONEAL/ASCITIC FLUID (SPECIMEN 1 OF 1 COLLECTED 01/18/18): MALIGNANT CELLS CONSISTENT WITH METASTATIC ADENOCARCINOMA. SEE COMMENT. COMMENT: THE MALIGNANT CELLS ARE POSITIVE FOR MOC-31, CYTOKERATIN 7, ESTROGEN RECEPTOR, PAX-8, AND WT-1. THEY ARE NEGATIVE FOR CALRETININ, CYTOKERATIN 5/6, AND CYTOKERATIN 20. THE PROFILE IS CONSISTENT WITH A PRIMARY GYNECOLOGIC CARCINOMA. THERE IS LIKELY SUFFICIENT TUMOR PRESENT, IF ADDITIONAL STUDIES ARE REQUESTED.    01/15/2018 - 02/02/2018 Hospital Admission    She was admitted to the hospital for SBO. She was treated with chemotherapy    01/18/2018 -  Chemotherapy    The patient had carboplatin and taxol    02/08/2018 Cancer Staging    Staging form: Ovary, Fallopian Tube, and Primary Peritoneal Carcinoma, AJCC 8th Edition - Clinical: cT3, cN0, cM0 - Signed by Heath Lark, MD on 02/08/2018    02/08/2018 Tumor Marker    Patient's tumor was tested for the following markers: CA-125. Results of the tumor marker test revealed 445    03/02/2018 Tumor Marker    Patient's tumor was tested for the following markers: CA-125. Results of the tumor marker test revealed  174    03/15/2018 Imaging    6.2 cm complex cystic right ovarian mass, compatible with malignant ovarian neoplasm, mildly progressive.  Associated  peritoneal disease/omental caking beneath the anterior abdominal wall, mildly improved. Prior abdominal ascites is improved/resolved.  4.7 cm cystic left ovarian mass, without overt malignant features, grossly unchanged.  Mild bilateral hydroureteronephrosis, secondary to extrinsic compression, new.  Prior small bowel obstruction has improved/resolved.     03/27/2018 Pathology Results    1. Omentum, resection for tumor - OMENTUM: - HIGH GRADE SEROUS CARCINOMA. 2. Ovary, left - LEFT OVARY: - HIGH GRADE SEROUS CARCINOMA. - LEFT FALLOPIAN TUBE: - HIGH GRADE SEROUS CARCINOMA. 3. Adnexa - ovary +/- tube, neoplastic, right - RIGHT OVARY: - HIGH GRADE SEROUS CARCINOMA, 5.5 CM. - RIGHT FALLOPIAN TUBE: - HIGH GRADE SEROUS CARCINOMA. 4. Peritoneum, biopsy, nodule - PERITONEUM: - NODULES OF HIGH GRADE SEROUS CARCINOMA. 5. Peritoneum, resection for tumor, rectosigmoid - RECTOSIGMOID PERITONEUM: - HIGH GRADE SEROUS CARCINOMA. Microscopic Comment 3. OVARY or FALLOPIAN TUBE or PRIMARY PERITONEUM: Procedure: Bilateral salpingo-oophorectomy, omentectomy and peritoneal biopsies. Specimen Integrity: N/A. Tumor Site: Right ovary. Ovarian Surface Involvement (required only if applicable): Yes. Fallopian Tube Surface Involvement (required only if applicable): Yes. Tumor Size: 5.5 x 4.8 x 4.0 cm. Histologic Type: Serous carcinoma. Histologic Grade: High grade. Implants (required for advanced stage serous/seromucinous borderline tumors only): Omentum, left ovary and fallopian tube, rectosigmoid peritoneum and peritoneum. Other Tissue/ Organ Involvement: Left ovary and fallopian tube, omentum and rectosigmoid peritoneum. Largest Extrapelvic Peritoneal Focus (required only if applicable): 91.4 cm, omentum. Peritoneal/Ascitic Fluid: N/A. Treatment Effect (required only for high-grade serous carcinomas): Minimal. Regional Lymph Nodes: No lymph nodes submitted. Pathologic Stage Classification  (pTNM, AJCC 8th Edition): pT3c, pNX. Representative Tumor Block: 3A, 3B, 3D, 3E and 69F. Comment(s): There is a 5.5 cm in greatest dimension partially cystic high grade serous carcinoma involving the right adnexal specimen and the tumor is staged as a primary right ovarian carcinoma. The carcinoma also involves the left ovary as well as the left fallopian tube, the omentum, peritoneum and the rectosigmoid peritoneal specimens.    03/27/2018 Surgery    Preoperative Diagnosis: ovarian cancer, stage IIIC, metastatic to omentum, peritoneum, serosa of intestines   Procedure(s) Performed: 1. Exploratory laparotomy with bilateral salpingo-oophorectomy, omentectomy radical tumor debulking for ovarian cancer .  Surgeon: Thereasa Solo, MD.   Specimens: Bilateral tubes / ovaries, omentum. Peritoneal nodules, rectosigmoid nodules.    Operative Findings: omental cake, miliary studding on diaphragm and bilateral paracolic gutters. Sigmoid colon densely adherent to left and right ovaries with tumor rind, ureters mildly dilated bilaterally due to retroperitoneal extension of the tumor towards ureters causing compression.    This represented an optimal cytoreduction (R1) with gross residual disease on the diaphragm that had been ablated and a thin tumor plaque on the sigmoid colon that was ablated. No residual disease >1cm remaining    05/07/2018 Genetic Testing    Negative genetic testing on the [facility] TumorNextHRD+ CancerNext Panel. The CancerNext gene panel offered by [employer] includes sequencing and rearrangement analysis for the following 34 genes:   APC, ATM, BARD1, BMPR1A, BRCA1, BRCA2, BRIP1, CDH1, CDK4, CDKN2A, CHEK2, DICER1, EPCAM, GREM1, HOXB13, MLH1, MRE11A, MSH2, MSH6, MUTYH, NBN, NF1, PALB2, PMS2, POLD1, POLE, PTEN, RAD50, RAD51C, RAD51D, SMAD4, SMARCA4, STK11, and TP53. Somatic genes analyzed through TumorNext-HRD: ATM, BARD1, BRCA1, BRCA2, BRIP1, CHEK2, MRE11A, NBN, PALB2, RAD51C, RAD51D. The  report date is 05/07/2018.     FAMILY HISTORY:  We obtained a detailed, 4-generation  family history.  Significant diagnoses are listed below: Family History  Problem Relation Age of Onset  . Diabetes Daughter   . Diabetes Mother   . Hypertension Mother   . Heart disease Mother   . Stroke Maternal Grandmother   . Lung cancer Niece   . Cancer Paternal Aunt        type of cancer unk  . Prostate cancer Paternal Uncle   . Uterine cancer Cousin        dx under 2  . Colon cancer Neg Hx    [name] has 2 daughters and 1 son. 1 daughter is deceased, no hx of cancer. She has 2 full sister and 3 full brothers.  1 brother had prostate cancer.  1 brother died young due to a heart attack, and his daughter (patient's niece) had lung cancer.   She also has a maternal half-sister and a maternal half-brother with no hx of cancer.   [name] father: died in his mid 38's, limited information available about his health.  Paternal Aunts/Uncles: limited information, several  Paternal cousins: limited info, no known hx of cancer.  Paternal grandfather: unk Paternal grandmother:unk  [name]'s mother: died due to heart disease.  Maternal Aunts/Uncles: 1 maternal aunt and 1 maternal uncle with no known hx of cancer.  Maternal cousins: 1 cousin had uterine cancer dx under the age of 81.  Maternal grandfather: unk Maternal grandmother:unk  [name] is unaware of previous family history of genetic testing for hereditary cancer risks. Patient's ancestors are of [place]. [place], [place], [place], [place], [place], [place]  descent, There is no reported Ashkenazi Jewish ancestry. There is no known consanguinity.  GENETIC TEST RESULTS: Genetic testing reported out on 05/07/2018 through the [facility] TumorNextHRD+CancerNext cancer panel found no pathogenic mutations.  The CancerNext gene panel offered by [employer] includes sequencing and rearrangement analysis for the following 34 genes:   APC, ATM,  BARD1, BMPR1A, BRCA1, BRCA2, BRIP1, CDH1, CDK4, CDKN2A, CHEK2, DICER1, EPCAM, GREM1, HOXB13, MLH1, MRE11A, MSH2, MSH6, MUTYH, NBN, NF1, PALB2, PMS2, POLD1, POLE, PTEN, RAD50, RAD51C, RAD51D, SMAD4, SMARCA4, STK11, and TP53.   Somatic genes analyzed through TumorNext-HRD: ATM, BARD1, BRCA1, BRCA2, BRIP1, CHEK2, MRE11A, NBN, PALB2, RAD51C, RAD51D.  The test report has been scanned into EPIC and is located under the Molecular Pathology section of the Results Review tab.  A portion of the result report is included below for reference.     We discussed with [name] that because current genetic testing is not perfect, it is possible there may be a gene mutation in one of these genes that current testing cannot detect, but that chance is small.  We also discussed, that there could be another gene that has not yet been discovered, or that we have not yet tested, that is responsible for the cancer diagnoses in the family. It is also possible there is a hereditary cause for the cancer in the family that [name] did not inherit and therefore was not identified in her testing.  Therefore, it is important to remain in touch with cancer genetics in the future so that we can continue to offer [name] the most up to date genetic testing.   ADDITIONAL GENETIC TESTING: We discussed with [name] that her genetic testing was fairly extensive.  If there are genes identified to increase cancer risk that can be analyzed in the future, we would be happy to discuss and coordinate this testing at that time.    CANCER  SCREENING RECOMMENDATIONS: [name] test result is considered negative (normal).  This means that we have not identified a hereditary cause for her personal and family history of cancer at this time. Most cancers happen by chance and this negative test suggests that her cancer may fall into this category.    While reassuring, this does not definitively rule out a hereditary predisposition to cancer.  It is still possible that there could be genetic mutations that are undetectable by current technology. There could be genetic mutations in genes that have not been tested or identified to increase cancer risk.  Therefore, it is recommended she continue to follow the cancer management and screening guidelines provided by her oncology and primary healthcare provider.   An individual's cancer risk and medical management are not determined by genetic test results alone. Overall cancer risk assessment incorporates additional factors, including personal medical history, family history, and any available genetic information that may result in a personalized plan for cancer prevention and surveillance  RECOMMENDATIONS FOR FAMILY MEMBERS:  Women in this family might be at some increased risk of developing cancer, over the general population risk, simply due to the family history of cancer.  We recommended women in this family have a yearly mammogram beginning at age 40, or 40 years younger than the earliest onset of cancer, an annual clinical breast exam, and perform monthly breast self-exams. Women in this family should also have a gynecological exam as recommended by their primary provider. All family members should have a colonoscopy by age 46.  FOLLOW-UP: Lastly, we discussed with [name] that cancer genetics is a rapidly advancing field and it is possible that new genetic tests will be appropriate for her and/or her family members in the future. We encouraged her to remain in contact with cancer genetics on an annual basis so we can update her personal and family histories and let her know of advances in cancer genetics that may benefit this family.   Our contact number was provided. [name] questions were answered to her satisfaction, and she knows she is welcome to call us at anytime with additional questions or concerns.   Faith Rogue, MS Genetic Counselor [email or web address] Phone:  [phone]

## 2018-05-11 ENCOUNTER — Inpatient Hospital Stay: Payer: PPO

## 2018-05-11 ENCOUNTER — Inpatient Hospital Stay: Payer: PPO | Attending: Obstetrics | Admitting: Hematology and Oncology

## 2018-05-11 ENCOUNTER — Other Ambulatory Visit: Payer: Self-pay

## 2018-05-11 ENCOUNTER — Encounter: Payer: Self-pay | Admitting: Hematology and Oncology

## 2018-05-11 DIAGNOSIS — C561 Malignant neoplasm of right ovary: Secondary | ICD-10-CM | POA: Diagnosis not present

## 2018-05-11 DIAGNOSIS — C569 Malignant neoplasm of unspecified ovary: Secondary | ICD-10-CM

## 2018-05-11 DIAGNOSIS — D6481 Anemia due to antineoplastic chemotherapy: Secondary | ICD-10-CM | POA: Diagnosis not present

## 2018-05-11 DIAGNOSIS — M79606 Pain in leg, unspecified: Secondary | ICD-10-CM

## 2018-05-11 DIAGNOSIS — Z5111 Encounter for antineoplastic chemotherapy: Secondary | ICD-10-CM | POA: Insufficient documentation

## 2018-05-11 DIAGNOSIS — T451X5A Adverse effect of antineoplastic and immunosuppressive drugs, initial encounter: Secondary | ICD-10-CM | POA: Diagnosis not present

## 2018-05-11 DIAGNOSIS — M549 Dorsalgia, unspecified: Secondary | ICD-10-CM

## 2018-05-11 DIAGNOSIS — G620 Drug-induced polyneuropathy: Secondary | ICD-10-CM

## 2018-05-11 DIAGNOSIS — D61818 Other pancytopenia: Secondary | ICD-10-CM | POA: Diagnosis not present

## 2018-05-11 DIAGNOSIS — M1990 Unspecified osteoarthritis, unspecified site: Secondary | ICD-10-CM

## 2018-05-11 DIAGNOSIS — Z79899 Other long term (current) drug therapy: Secondary | ICD-10-CM

## 2018-05-11 LAB — CBC WITH DIFFERENTIAL (CANCER CENTER ONLY)
Abs Immature Granulocytes: 0.11 10*3/uL — ABNORMAL HIGH (ref 0.00–0.07)
Basophils Absolute: 0 10*3/uL (ref 0.0–0.1)
Basophils Relative: 0 %
Eosinophils Absolute: 0 10*3/uL (ref 0.0–0.5)
Eosinophils Relative: 0 %
HCT: 37.1 % (ref 36.0–46.0)
Hemoglobin: 11.7 g/dL — ABNORMAL LOW (ref 12.0–15.0)
Immature Granulocytes: 2 %
Lymphocytes Relative: 15 %
Lymphs Abs: 1.1 10*3/uL (ref 0.7–4.0)
MCH: 29.6 pg (ref 26.0–34.0)
MCHC: 31.5 g/dL (ref 30.0–36.0)
MCV: 93.9 fL (ref 80.0–100.0)
Monocytes Absolute: 0.1 10*3/uL (ref 0.1–1.0)
Monocytes Relative: 1 %
Neutro Abs: 6 10*3/uL (ref 1.7–7.7)
Neutrophils Relative %: 82 %
Platelet Count: 182 10*3/uL (ref 150–400)
RBC: 3.95 MIL/uL (ref 3.87–5.11)
RDW: 14.9 % (ref 11.5–15.5)
WBC Count: 7.3 10*3/uL (ref 4.0–10.5)
nRBC: 0 % (ref 0.0–0.2)

## 2018-05-11 LAB — CMP (CANCER CENTER ONLY)
ALT: 10 U/L (ref 0–44)
AST: 9 U/L — ABNORMAL LOW (ref 15–41)
Albumin: 3.6 g/dL (ref 3.5–5.0)
Alkaline Phosphatase: 88 U/L (ref 38–126)
Anion gap: 11 (ref 5–15)
BUN: 12 mg/dL (ref 8–23)
CO2: 24 mmol/L (ref 22–32)
Calcium: 9.6 mg/dL (ref 8.9–10.3)
Chloride: 104 mmol/L (ref 98–111)
Creatinine: 0.75 mg/dL (ref 0.44–1.00)
GFR, Est AFR Am: >60 mL/min (ref >60)
GFR, Estimated: >60 mL/min (ref >60)
Glucose, Bld: 194 mg/dL — ABNORMAL HIGH (ref 70–99)
Potassium: 3.9 mmol/L (ref 3.5–5.1)
Sodium: 139 mmol/L (ref 135–145)
Total Bilirubin: <0.2 mg/dL — ABNORMAL LOW (ref 0.3–1.2)
Total Protein: 7.4 g/dL (ref 6.5–8.1)

## 2018-05-11 MED ORDER — PALONOSETRON HCL INJECTION 0.25 MG/5ML
INTRAVENOUS | Status: AC
  Filled 2018-05-11: qty 5

## 2018-05-11 MED ORDER — SODIUM CHLORIDE 0.9 % IV SOLN
131.2500 mg/m2 | Freq: Once | INTRAVENOUS | Status: AC
  Administered 2018-05-11: 204 mg via INTRAVENOUS
  Filled 2018-05-11: qty 34

## 2018-05-11 MED ORDER — HEPARIN SOD (PORK) LOCK FLUSH 100 UNIT/ML IV SOLN
500.0000 [IU] | Freq: Once | INTRAVENOUS | Status: AC | PRN
  Administered 2018-05-11: 17:00:00 500 [IU]
  Filled 2018-05-11: qty 5

## 2018-05-11 MED ORDER — OXYCODONE HCL 5 MG PO TABS: 5.0000 mg | ORAL_TABLET | Freq: Four times a day (QID) | ORAL | 0 refills | Status: DC | PRN

## 2018-05-11 MED ORDER — SODIUM CHLORIDE 0.9 % IV SOLN
400.0000 mg | Freq: Once | INTRAVENOUS | Status: AC
  Administered 2018-05-11: 16:00:00 400 mg via INTRAVENOUS
  Filled 2018-05-11: qty 40

## 2018-05-11 MED ORDER — PALONOSETRON HCL INJECTION 0.25 MG/5ML
0.2500 mg | Freq: Once | INTRAVENOUS | Status: AC
  Administered 2018-05-11: 11:00:00 0.25 mg via INTRAVENOUS

## 2018-05-11 MED ORDER — SODIUM CHLORIDE 0.9% FLUSH
10.0000 mL | INTRAVENOUS | Status: DC | PRN
  Administered 2018-05-11: 17:00:00 10 mL
  Filled 2018-05-11: qty 10

## 2018-05-11 MED ORDER — FAMOTIDINE IN NACL 20-0.9 MG/50ML-% IV SOLN
20.0000 mg | Freq: Once | INTRAVENOUS | Status: AC
  Administered 2018-05-11: 20 mg via INTRAVENOUS

## 2018-05-11 MED ORDER — SODIUM CHLORIDE 0.9 % IV SOLN
Freq: Once | INTRAVENOUS | Status: AC
  Administered 2018-05-11: 11:00:00 via INTRAVENOUS
  Filled 2018-05-11: qty 250

## 2018-05-11 MED ORDER — FAMOTIDINE IN NACL 20-0.9 MG/50ML-% IV SOLN
INTRAVENOUS | Status: AC
  Filled 2018-05-11: qty 50

## 2018-05-11 MED ORDER — SODIUM CHLORIDE 0.9 % IV SOLN
Freq: Once | INTRAVENOUS | Status: AC
  Administered 2018-05-11: 12:00:00 via INTRAVENOUS
  Filled 2018-05-11: qty 5

## 2018-05-11 MED ORDER — SODIUM CHLORIDE 0.9% FLUSH
10.0000 mL | Freq: Once | INTRAVENOUS | Status: AC
  Administered 2018-05-11: 10 mL
  Filled 2018-05-11: qty 10

## 2018-05-11 MED ORDER — DIPHENHYDRAMINE HCL 50 MG/ML IJ SOLN
12.5000 mg | Freq: Once | INTRAMUSCULAR | Status: AC
  Administered 2018-05-11: 11:00:00 12.5 mg via INTRAVENOUS

## 2018-05-11 MED ORDER — DIPHENHYDRAMINE HCL 50 MG/ML IJ SOLN
INTRAMUSCULAR | Status: AC
  Filled 2018-05-11: qty 1

## 2018-05-11 NOTE — Progress Notes (Signed)
[place] OFFICE PROGRESS NOTE  Patient Care Team: Martinique, Gina G, MD as PCP - General (Family Medicine) Troy Sine, MD as PCP - Cardiology (Cardiology) Lavera Guise, [facility] as [place] Management (Pharmacist) Neldon Labella, RN as [place] Management Hess, Craige Cotta, RN as Oncology Nurse Navigator (Oncology)  ASSESSMENT & PLAN:  Right ovarian epithelial cancer [facility]) She tolerated chemotherapy well with recent minor dose adjustment without difficulties We will proceed with treatment today We will finish 6 cycles of chemo before repeat imaging study.  Peripheral neuropathy due to chemotherapy [facility]) she has mild peripheral neuropathy, likely related to side effects of treatment. It has been stable since last visit.  We will continue treatment as scheduled   Anemia due to antineoplastic chemotherapy This is likely anemia of chronic disease. The patient denies recent history of bleeding such as epistaxis, hematuria or hematochezia. She is asymptomatic from the anemia. We will observe for now.   Lower extremity pain She has intermittent leg pain and back pain secondary to degenerative arthritis I recommend judicious use of pain medicine and she is aware that I am only giving her prescription pain medicine until the end of treatment   No orders of the defined types were placed in this encounter.   INTERVAL HISTORY: Please see below for problem oriented charting. She is seen prior to cycle 5 of chemotherapy She denies worsening peripheral neuropathy Her leg pain is stable The patient denies any recent signs or symptoms of bleeding such as spontaneous epistaxis, hematuria or hematochezia. Denies recent nausea or changes in bowel habits  SUMMARY OF ONCOLOGIC HISTORY: Oncology History   Neg for genetics     Right ovarian epithelial cancer ([place])   01/06/2018 Imaging    Ct abdomen and pelvis 1. Complex cystic mass at  the right adnexa, measuring 5.9 x 4.0 cm, with nodular components, concerning for primary ovarian malignancy. 2. Diffuse nodularity along the omentum at the left side of the abdomen, extending into the mesentery at the left mid abdomen, concerning for peritoneal carcinomatosis. 3. Wall thickening at the distal ileum adjacent to the ovarian mass; bowel loops appear somewhat adherent to the ovarian mass. Bowel infiltration with tumor cannot be excluded. No evidence of bowel obstruction at this time. 4. Small volume ascites within the abdomen and pelvis.  Aortic Atherosclerosis (ICD10-I70.0).    01/08/2018 Tumor Marker    Patient's tumor was tested for the following markers: CA-125. Results of the tumor marker test revealed 3004    01/15/2018 Imaging    Chest CT:  1. No active cardiopulmonary disease. 2. Aortic atherosclerosis without aneurysm or dissection. 3. No large central pulmonary embolus.  CT AP:  1. Dilated fluid-filled loops of small bowel are redemonstrated slightly more extensive than on prior exam with transition point likely in the right adnexa adjacent to a complex cystic mass concerning for ovarian neoplasm given septations and soft tissue nodularity. This raises concern for early or partial SBO. This soft tissue mass measures 4.9 x 4 x 4.7 cm and has not changed since prior recent comparison. Additional short segmental area of luminal narrowing is noted in the right lower quadrant involving small bowel for which stigmata of peritoneal carcinomatosis or small-bowel metastatic implants might account for this. 2. Redemonstration of small volume of ascites predominantly in the upper abdomen surrounding the liver and spleen. 3. Redemonstration of thick bandlike omental thickening concerning for peritoneal carcinomatosis.     01/15/2018 Procedure  Successful ultrasound-guided diagnostic and therapeutic paracentesis yielding 1.5 liters of peritoneal fluid.     01/15/2018  Pathology Results    PERITONEAL/ASCITIC FLUID (SPECIMEN 1 OF 1 COLLECTED 01/18/18): MALIGNANT CELLS CONSISTENT WITH METASTATIC ADENOCARCINOMA. SEE COMMENT. COMMENT: THE MALIGNANT CELLS ARE POSITIVE FOR MOC-31, CYTOKERATIN 7, ESTROGEN RECEPTOR, PAX-8, AND WT-1. THEY ARE NEGATIVE FOR CALRETININ, CYTOKERATIN 5/6, AND CYTOKERATIN 20. THE PROFILE IS CONSISTENT WITH A PRIMARY GYNECOLOGIC CARCINOMA. THERE IS LIKELY SUFFICIENT TUMOR PRESENT, IF ADDITIONAL STUDIES ARE REQUESTED.    01/15/2018 - 02/02/2018 Hospital Admission    She was admitted to the hospital for SBO. She was treated with chemotherapy    01/18/2018 -  Chemotherapy    The patient had carboplatin and taxol    02/08/2018 Cancer Staging    Staging form: Ovary, Fallopian Tube, and Primary Peritoneal Carcinoma, AJCC 8th Edition - Clinical: cT3, cN0, cM0 - Signed by Heath Lark, MD on 02/08/2018    02/08/2018 Tumor Marker    Patient's tumor was tested for the following markers: CA-125. Results of the tumor marker test revealed 445    03/02/2018 Tumor Marker    Patient's tumor was tested for the following markers: CA-125. Results of the tumor marker test revealed 174    03/15/2018 Imaging    6.2 cm complex cystic right ovarian mass, compatible with malignant ovarian neoplasm, mildly progressive.  Associated peritoneal disease/omental caking beneath the anterior abdominal wall, mildly improved. Prior abdominal ascites is improved/resolved.  4.7 cm cystic left ovarian mass, without overt malignant features, grossly unchanged.  Mild bilateral hydroureteronephrosis, secondary to extrinsic compression, new.  Prior small bowel obstruction has improved/resolved.     03/27/2018 Pathology Results    1. Omentum, resection for tumor - OMENTUM: - HIGH GRADE SEROUS CARCINOMA. 2. Ovary, left - LEFT OVARY: - HIGH GRADE SEROUS CARCINOMA. - LEFT FALLOPIAN TUBE: - HIGH GRADE SEROUS CARCINOMA. 3. Adnexa - ovary +/- tube, neoplastic, right - RIGHT  OVARY: - HIGH GRADE SEROUS CARCINOMA, 5.5 CM. - RIGHT FALLOPIAN TUBE: - HIGH GRADE SEROUS CARCINOMA. 4. Peritoneum, biopsy, nodule - PERITONEUM: - NODULES OF HIGH GRADE SEROUS CARCINOMA. 5. Peritoneum, resection for tumor, rectosigmoid - RECTOSIGMOID PERITONEUM: - HIGH GRADE SEROUS CARCINOMA. Microscopic Comment 3. OVARY or FALLOPIAN TUBE or PRIMARY PERITONEUM: Procedure: Bilateral salpingo-oophorectomy, omentectomy and peritoneal biopsies. Specimen Integrity: N/A. Tumor Site: Right ovary. Ovarian Surface Involvement (required only if applicable): Yes. Fallopian Tube Surface Involvement (required only if applicable): Yes. Tumor Size: 5.5 x 4.8 x 4.0 cm. Histologic Type: Serous carcinoma. Histologic Grade: High grade. Implants (required for advanced stage serous/seromucinous borderline tumors only): Omentum, left ovary and fallopian tube, rectosigmoid peritoneum and peritoneum. Other Tissue/ Organ Involvement: Left ovary and fallopian tube, omentum and rectosigmoid peritoneum. Largest Extrapelvic Peritoneal Focus (required only if applicable): 46.2 cm, omentum. Peritoneal/Ascitic Fluid: N/A. Treatment Effect (required only for high-grade serous carcinomas): Minimal. Regional Lymph Nodes: No lymph nodes submitted. Pathologic Stage Classification (pTNM, AJCC 8th Edition): pT3c, pNX. Representative Tumor Block: 3A, 3B, 3D, 3E and 41F. Comment(s): There is a 5.5 cm in greatest dimension partially cystic high grade serous carcinoma involving the right adnexal specimen and the tumor is staged as a primary right ovarian carcinoma. The carcinoma also involves the left ovary as well as the left fallopian tube, the omentum, peritoneum and the rectosigmoid peritoneal specimens.    03/27/2018 Surgery    Preoperative Diagnosis: ovarian cancer, stage IIIC, metastatic to omentum, peritoneum, serosa of intestines   Procedure(s) Performed: 1. Exploratory laparotomy with bilateral  salpingo-oophorectomy, omentectomy radical tumor  debulking for ovarian cancer .  Surgeon: Thereasa Solo, MD.   Specimens: Bilateral tubes / ovaries, omentum. Peritoneal nodules, rectosigmoid nodules.    Operative Findings: omental cake, miliary studding on diaphragm and bilateral paracolic gutters. Sigmoid colon densely adherent to left and right ovaries with tumor rind, ureters mildly dilated bilaterally due to retroperitoneal extension of the tumor towards ureters causing compression.    This represented an optimal cytoreduction (R1) with gross residual disease on the diaphragm that had been ablated and a thin tumor plaque on the sigmoid colon that was ablated. No residual disease >1cm remaining    05/07/2018 Genetic Testing    Negative genetic testing on the [employer] TumorNextHRD+ CancerNext Panel. The CancerNext gene panel offered by [employer] includes sequencing and rearrangement analysis for the following 34 genes:   APC, ATM, BARD1, BMPR1A, BRCA1, BRCA2, BRIP1, CDH1, CDK4, CDKN2A, CHEK2, DICER1, EPCAM, GREM1, HOXB13, MLH1, MRE11A, MSH2, MSH6, MUTYH, NBN, NF1, PALB2, PMS2, POLD1, POLE, PTEN, RAD50, RAD51C, RAD51D, SMAD4, SMARCA4, STK11, and TP53. Somatic genes analyzed through TumorNext-HRD: ATM, BARD1, BRCA1, BRCA2, BRIP1, CHEK2, MRE11A, NBN, PALB2, RAD51C, RAD51D. The report date is 05/07/2018.     REVIEW OF SYSTEMS:   Constitutional: Denies fevers, chills or abnormal weight loss Eyes: Denies blurriness of vision Ears, nose, mouth, throat, and face: Denies mucositis or sore throat Respiratory: Denies cough, dyspnea or wheezes Cardiovascular: Denies palpitation, chest discomfort or lower extremity swelling Gastrointestinal:  Denies nausea, heartburn or change in bowel habits Skin: Denies abnormal skin rashes Lymphatics: Denies new lymphadenopathy or easy bruising Neurological:Denies numbness, tingling or new weaknesses Behavioral/Psych: Mood is stable, no new changes  All other  systems were reviewed with the patient and are negative.  I have reviewed the past medical history, past surgical history, social history and family history with the patient and they are unchanged from previous note.  ALLERGIES:  is allergic to tramadol hcl.  MEDICATIONS:  Current Outpatient Medications  Medication Sig Dispense Refill  . acetaminophen (TYLENOL) 500 MG tablet Take 2 tablets (1,000 mg total) by mouth every 6 (six) hours. (Patient taking differently: Take 1,000 mg by mouth every 6 (six) hours as needed. ) 30 tablet 0  . ALPRAZolam (XANAX) 0.5 MG tablet Take 0.5-1 tablets (0.25-0.5 mg total) by mouth daily as needed for anxiety. 30 tablet 2  . alum & mag hydroxide-simeth (MAALOX/MYLANTA) 200-200-20 MG/5ML suspension Take 30 mLs by mouth every 6 (six) hours as needed for indigestion or heartburn. 355 mL 0  . Calcium Carbonate (CALTRATE 600) 1500 MG TABS Take 600 mg of elemental calcium by mouth daily.     . Carboxymethylcellulose Sodium (EYE DROPS OP) Place 1 drop into both eyes daily as needed (dry eyes).     . cholecalciferol (VITAMIN D) 1000 UNITS tablet Take 1,000 Units by mouth daily.     Marland Kitchen dexamethasone (DECADRON) 4 MG tablet Take 2 tabs at the night before and 2 tabs the morning of chemotherapy, every 3 weeks, by mouth 20 tablet 0  . ferrous gluconate (FERGON) 324 MG tablet Take 1 tablet (324 mg total) by mouth 3 (three) times daily with meals. 90 tablet 3  . fluticasone (FLONASE) 50 MCG/ACT nasal spray Use 2 sprays in each nostril daily (Patient taking differently: Place 2 sprays into both nostrils daily. ) 48 g 2  . hydrocortisone (ANUSOL-HC) 25 MG suppository Place 1 suppository (25 mg total) rectally 2 (two) times daily as needed for hemorrhoids or anal itching. 12 suppository 1  . ibuprofen (ADVIL,MOTRIN) 200  MG tablet Take 600 mg by mouth every 6 (six) hours as needed for moderate pain.     Marland Kitchen lactose free nutrition (BOOST PLUS) LIQD Take 237 mLs by mouth daily. (Patient  taking differently: Take 237 mLs by mouth 2 (two) times daily between meals. ) 10 Can 0  . levothyroxine (SYNTHROID, LEVOTHROID) 75 MCG tablet Take 1 tablet (75 mcg total) by mouth daily. (Patient taking differently: Take 75 mcg by mouth daily before breakfast. ) 90 tablet 3  . lidocaine-prilocaine (EMLA) cream Apply 1 application topically as needed. 30 g 6  . Magnesium 400 MG TABS Take 250 mg by mouth daily.    . Menthol-Methyl Salicylate (MUSCLE RUB) 10-15 % CREA Apply 1 application topically as needed for muscle pain.    . metoprolol succinate (TOPROL-XL) 50 MG 24 hr tablet Take 1 tablet (50 mg total) by mouth daily. Take with or immediately following a meal. 90 tablet 3  . Multiple Vitamins-Minerals (MULTIVITAMIN WITH MINERALS) tablet Take 1 tablet by mouth daily.      . Omega-3 Fatty Acids (FISH OIL) 1000 MG CAPS Take 2,000 mg by mouth 2 (two) times daily.     Marland Kitchen omeprazole (PRILOSEC) 40 MG capsule Take 1 capsule (40 mg total) by mouth daily. 90 capsule 3  . ondansetron (ZOFRAN) 8 MG tablet Take 1 tablet (8 mg total) by mouth every 8 (eight) hours as needed for nausea. 90 tablet 3  . oxyCODONE (OXY IR/ROXICODONE) 5 MG immediate release tablet Take 1 tablet (5 mg total) by mouth every 6 (six) hours as needed for severe pain. 30 tablet 0  . polyethylene glycol (MIRALAX / GLYCOLAX) packet Take 17 g by mouth daily. (Patient taking differently: Take 17 g by mouth daily as needed for moderate constipation. ) 14 each 0  . prochlorperazine (COMPAZINE) 10 MG tablet Take 1 tablet (10 mg total) by mouth every 6 (six) hours as needed for nausea or vomiting. (Patient not taking: Reported on 05/02/2018) 60 tablet 11  . rivaroxaban (XARELTO) 20 MG TABS tablet Take 1 tablet (20 mg total) by mouth daily with supper. 14 tablet 0  . senna (SENOKOT) 8.6 MG TABS tablet Take 1 tablet (8.6 mg total) by mouth at bedtime as needed for mild constipation. 120 each 0  . senna-docusate (SENOKOT-S) 8.6-50 MG tablet Take 2  tablets by mouth at bedtime. For AFTER surgery 30 tablet 0  . simvastatin (ZOCOR) 20 MG tablet Take 1 tablet (20 mg total) by mouth at bedtime. 90 tablet 3  . vitamin C (ASCORBIC ACID) 500 MG tablet Take 1,000 mg by mouth daily.     No current facility-administered medications for this visit.    Facility-Administered Medications Ordered in Other Visits  Medication Dose Route Frequency Provider Last Rate Last Dose  . CARBOplatin (PARAPLATIN) 400 mg in sodium chloride 0.9 % 250 mL chemo infusion  400 mg Intravenous Once Alvy Bimler, Manahil Vanzile, MD      . heparin lock flush 100 unit/mL  500 Units Intracatheter Once PRN Alvy Bimler, Ahron Hulbert, MD      . PACLitaxel (TAXOL) 204 mg in sodium chloride 0.9 % 250 mL chemo infusion (> 69m/m2)  131.25 mg/m2 (Treatment Plan Adjusted) Intravenous Once GAlvy Bimler Wolfgang Finigan, MD 95 mL/hr at 05/11/18 1238 204 mg at 05/11/18 1238  . sodium chloride flush (NS) 0.9 % injection 10 mL  10 mL Intracatheter PRN GHeath Lark MD        PHYSICAL EXAMINATION: ECOG PERFORMANCE STATUS: 1 - Symptomatic but completely ambulatory  Vitals:  05/11/18 1019  BP: (!) 153/93  Pulse: 90  Resp: 18  Temp: 98 F (36.7 C)  SpO2: 97%   Filed Weights   05/11/18 1019  Weight: 128 lb 12.8 oz (58.4 kg)    GENERAL:alert, no distress and comfortable SKIN: skin color, texture, turgor are normal, no rashes or significant lesions EYES: normal, Conjunctiva are pink and non-injected, sclera clear OROPHARYNX:no exudate, no erythema and lips, buccal mucosa, and tongue normal  NECK: supple, thyroid normal size, non-tender, without nodularity LYMPH:  no palpable lymphadenopathy in the cervical, axillary or inguinal LUNGS: clear to auscultation and percussion with normal breathing effort HEART: regular rate & rhythm and no murmurs and no lower extremity edema ABDOMEN:abdomen soft, non-tender and normal bowel sounds Musculoskeletal:no cyanosis of digits and no clubbing  NEURO: alert & oriented x 3 with fluent speech,  no focal motor/sensory deficits  LABORATORY DATA:  I have reviewed the data as listed    Component Value Date/Time   NA 139 05/11/2018 0945   K 3.9 05/11/2018 0945   CL 104 05/11/2018 0945   CO2 24 05/11/2018 0945   GLUCOSE 194 (H) 05/11/2018 0945   BUN 12 05/11/2018 0945   CREATININE 0.75 05/11/2018 0945   CREATININE 0.76 11/17/2015 1005   CALCIUM 9.6 05/11/2018 0945   PROT 7.4 05/11/2018 0945   ALBUMIN 3.6 05/11/2018 0945   AST 9 (L) 05/11/2018 0945   ALT 10 05/11/2018 0945   ALKPHOS 88 05/11/2018 0945   BILITOT <0.2 (L) 05/11/2018 0945   GFRNONAA >60 05/11/2018 0945   GFRAA >60 05/11/2018 0945    No results found for: SPEP, UPEP  Lab Results  Component Value Date   WBC 7.3 05/11/2018   NEUTROABS 6.0 05/11/2018   HGB 11.7 (L) 05/11/2018   HCT 37.1 05/11/2018   MCV 93.9 05/11/2018   PLT 182 05/11/2018      Chemistry      Component Value Date/Time   NA 139 05/11/2018 0945   K 3.9 05/11/2018 0945   CL 104 05/11/2018 0945   CO2 24 05/11/2018 0945   BUN 12 05/11/2018 0945   CREATININE 0.75 05/11/2018 0945   CREATININE 0.76 11/17/2015 1005      Component Value Date/Time   CALCIUM 9.6 05/11/2018 0945   ALKPHOS 88 05/11/2018 0945   AST 9 (L) 05/11/2018 0945   ALT 10 05/11/2018 0945   BILITOT <0.2 (L) 05/11/2018 0945      All questions were answered. The patient knows to call the clinic with any problems, questions or concerns. No barriers to learning was detected.  I spent 15 minutes counseling the patient face to face. The total time spent in the appointment was 20 minutes and more than 50% was on counseling and review of test results  Heath Lark, MD 05/11/2018 12:44 PM

## 2018-05-11 NOTE — Assessment & Plan Note (Signed)
She tolerated chemotherapy well with recent minor dose adjustment without difficulties We will proceed with treatment today We will finish 6 cycles of chemo before repeat imaging study.

## 2018-05-11 NOTE — Assessment & Plan Note (Signed)
She has intermittent leg pain and back pain secondary to degenerative arthritis I recommend judicious use of pain medicine and she is aware that I am only giving her prescription pain medicine until the end of treatment

## 2018-05-11 NOTE — Patient Instructions (Signed)
[facility] Discharge Instructions for Patients Receiving Chemotherapy  Today you received the following chemotherapy agents Paclitaxel (TAXOL) & Carboplatin (PARAPLATIN).  To help prevent nausea and vomiting after your treatment, we encourage you to take your nausea medication as prescribed.   If you develop nausea and vomiting that is not controlled by your nausea medication, call the clinic.   BELOW ARE SYMPTOMS THAT SHOULD BE REPORTED IMMEDIATELY:  *FEVER GREATER THAN 100.5 F  *CHILLS WITH OR WITHOUT FEVER  NAUSEA AND VOMITING THAT IS NOT CONTROLLED WITH YOUR NAUSEA MEDICATION  *UNUSUAL SHORTNESS OF BREATH  *UNUSUAL BRUISING OR BLEEDING  TENDERNESS IN MOUTH AND THROAT WITH OR WITHOUT PRESENCE OF ULCERS  *URINARY PROBLEMS  *BOWEL PROBLEMS  UNUSUAL RASH Items with * indicate a potential emergency and should be followed up as soon as possible.  Feel free to call the clinic should you have any questions or concerns. The clinic phone number is [phone].  Please show the CHEMO ALERT CARD at check-in to the Emergency Department and triage nurse.  Coronavirus (COVID-19) Are you at risk?  Are you at risk for the Coronavirus (COVID-19)?  To be considered HIGH RISK for Coronavirus (COVID-19), you have to meet the following criteria:  . Traveled to [place], [place], [place], [place] or [place]; or in the [place] to [place], [place], [place], or [place]; and have fever, cough, and shortness of breath within the last 2 weeks of travel OR . Been in close contact with a person diagnosed with COVID-19 within the last 2 weeks and have fever, cough, and shortness of breath . IF YOU DO NOT MEET THESE CRITERIA, YOU ARE CONSIDERED LOW RISK FOR COVID-19.  What to do if you are HIGH RISK for COVID-19?  . If you are having a medical emergency, call 911. . Seek medical care right away. Before you go to a doctor's office, urgent care or emergency department,  call ahead and tell them about your recent travel, contact with someone diagnosed with COVID-19, and your symptoms. You should receive instructions from your physician's office regarding next steps of care.  . When you arrive at healthcare provider, tell the healthcare staff immediately you have returned from visiting [place], [place], [place], [place] or [place]; or traveled in the [place] to [place], [place], [place], or [place]; in the last two weeks or you have been in close contact with a person diagnosed with COVID-19 in the last 2 weeks.   . Tell the health care staff about your symptoms: fever, cough and shortness of breath. . After you have been seen by a medical provider, you will be either: o Tested for (COVID-19) and discharged home on quarantine except to seek medical care if symptoms worsen, and asked to  - Stay home and avoid contact with others until you get your results (4-5 days)  - Avoid travel on public transportation if possible (such as bus, train, or airplane) or o Sent to the Emergency Department by EMS for evaluation, COVID-19 testing, and possible admission depending on your condition and test results.  What to do if you are LOW RISK for COVID-19?  Reduce your risk of any infection by using the same precautions used for avoiding the common cold or flu:  . Wash your hands often with soap and warm water for at least 20 seconds.  If soap and water are not readily available, use an alcohol-based hand sanitizer with at least 60% alcohol.  .   If coughing or sneezing, cover your mouth and nose by coughing or sneezing into the elbow areas of your shirt or coat, into a tissue or into your sleeve (not your hands). . Avoid shaking hands with others and consider head nods or verbal greetings only. . Avoid touching your eyes, nose, or mouth with unwashed hands.  . Avoid close contact with people who are sick. . Avoid places or events with large numbers of people in one  location, like concerts or sporting events. . Carefully consider travel plans you have or are making. . If you are planning any travel outside or inside the [place], visit the CDC's Travelers' Health webpage for the latest health notices. . If you have some symptoms but not all symptoms, continue to monitor at home and seek medical attention if your symptoms worsen. . If you are having a medical emergency, call 911.   ADDITIONAL HEALTHCARE OPTIONS FOR PATIENTS  [facility] / e-Visit: [email or web address]         [facility]: [phone]  [facility]: [phone]                   [facility]: [phone]   

## 2018-05-11 NOTE — Assessment & Plan Note (Signed)
This is likely anemia of chronic disease. The patient denies recent history of bleeding such as epistaxis, hematuria or hematochezia. She is asymptomatic from the anemia. We will observe for now.  

## 2018-05-11 NOTE — Assessment & Plan Note (Signed)
she has mild peripheral neuropathy, likely related to side effects of treatment. It has been stable since last visit.  We will continue treatment as scheduled

## 2018-05-12 LAB — CA 125: Cancer Antigen (CA) 125: 60.5 U/mL — ABNORMAL HIGH (ref 0.0–38.1)

## 2018-05-14 ENCOUNTER — Telehealth: Payer: Self-pay | Admitting: Hematology and Oncology

## 2018-05-14 NOTE — Telephone Encounter (Signed)
Called regarding schedule °

## 2018-05-25 ENCOUNTER — Other Ambulatory Visit: Payer: Self-pay | Admitting: Pharmacist

## 2018-05-25 NOTE — Patient Outreach (Addendum)
[name]) Care Management [place]  05/25/2018  Haley [name] March 15, 1945 [id number]  Reason for referral: medication assistance/management  Successful care coordination HTA to discuss patient's TROOP in order to apply to Xarelto patient assistance.  The goal is to apply for Xarelto patient assistance, however patient has not yet met out of pocket minimum.  She has only met $380 (out of pocket) and needs to meet $900 to qualify for patient assistance program.  I will continue to follow patient and we will apply as able.  Patient is able to pay $30/month copay for now.  Thank you for allowing [facility] pharmacy to be involved in this patient's care.    PLAN:  -Will reach out to patient in the next 3 months   Regina Eck, PharmD, [place] [phone]

## 2018-05-28 ENCOUNTER — Ambulatory Visit: Payer: Self-pay | Admitting: Pharmacist

## 2018-05-31 ENCOUNTER — Inpatient Hospital Stay: Payer: PPO

## 2018-05-31 ENCOUNTER — Inpatient Hospital Stay (HOSPITAL_BASED_OUTPATIENT_CLINIC_OR_DEPARTMENT_OTHER): Payer: PPO | Admitting: Hematology and Oncology

## 2018-05-31 ENCOUNTER — Other Ambulatory Visit: Payer: Self-pay

## 2018-05-31 VITALS — BP 131/74 | HR 86 | Temp 98.3°F | Resp 18 | Ht 62.0 in | Wt 132.7 lb

## 2018-05-31 DIAGNOSIS — T451X5A Adverse effect of antineoplastic and immunosuppressive drugs, initial encounter: Secondary | ICD-10-CM

## 2018-05-31 DIAGNOSIS — G620 Drug-induced polyneuropathy: Secondary | ICD-10-CM | POA: Diagnosis not present

## 2018-05-31 DIAGNOSIS — C569 Malignant neoplasm of unspecified ovary: Secondary | ICD-10-CM

## 2018-05-31 DIAGNOSIS — D61818 Other pancytopenia: Secondary | ICD-10-CM

## 2018-05-31 DIAGNOSIS — M79606 Pain in leg, unspecified: Secondary | ICD-10-CM

## 2018-05-31 DIAGNOSIS — D6481 Anemia due to antineoplastic chemotherapy: Secondary | ICD-10-CM | POA: Diagnosis not present

## 2018-05-31 DIAGNOSIS — M549 Dorsalgia, unspecified: Secondary | ICD-10-CM | POA: Diagnosis not present

## 2018-05-31 DIAGNOSIS — C561 Malignant neoplasm of right ovary: Secondary | ICD-10-CM

## 2018-05-31 DIAGNOSIS — M1990 Unspecified osteoarthritis, unspecified site: Secondary | ICD-10-CM | POA: Diagnosis not present

## 2018-05-31 DIAGNOSIS — Z79899 Other long term (current) drug therapy: Secondary | ICD-10-CM | POA: Diagnosis not present

## 2018-05-31 DIAGNOSIS — Z5111 Encounter for antineoplastic chemotherapy: Secondary | ICD-10-CM | POA: Diagnosis not present

## 2018-05-31 LAB — CMP (CANCER CENTER ONLY)
ALT: 13 U/L (ref 0–44)
AST: 11 U/L — ABNORMAL LOW (ref 15–41)
Albumin: 3.6 g/dL (ref 3.5–5.0)
Alkaline Phosphatase: 83 U/L (ref 38–126)
Anion gap: 9 (ref 5–15)
BUN: 12 mg/dL (ref 8–23)
CO2: 23 mmol/L (ref 22–32)
Calcium: 9.1 mg/dL (ref 8.9–10.3)
Chloride: 107 mmol/L (ref 98–111)
Creatinine: 0.71 mg/dL (ref 0.44–1.00)
GFR, Est AFR Am: >60 mL/min (ref >60)
GFR, Estimated: >60 mL/min (ref >60)
Glucose, Bld: 153 mg/dL — ABNORMAL HIGH (ref 70–99)
Potassium: 3.8 mmol/L (ref 3.5–5.1)
Sodium: 139 mmol/L (ref 135–145)
Total Bilirubin: 0.2 mg/dL — ABNORMAL LOW (ref 0.3–1.2)
Total Protein: 7.1 g/dL (ref 6.5–8.1)

## 2018-05-31 LAB — CBC WITH DIFFERENTIAL (CANCER CENTER ONLY)
Abs Immature Granulocytes: 0.03 10*3/uL (ref 0.00–0.07)
Basophils Absolute: 0 10*3/uL (ref 0.0–0.1)
Basophils Relative: 0 %
Eosinophils Absolute: 0 10*3/uL (ref 0.0–0.5)
Eosinophils Relative: 0 %
HCT: 36.8 % (ref 36.0–46.0)
Hemoglobin: 11.6 g/dL — ABNORMAL LOW (ref 12.0–15.0)
Immature Granulocytes: 1 %
Lymphocytes Relative: 13 %
Lymphs Abs: 0.6 10*3/uL — ABNORMAL LOW (ref 0.7–4.0)
MCH: 30.1 pg (ref 26.0–34.0)
MCHC: 31.5 g/dL (ref 30.0–36.0)
MCV: 95.3 fL (ref 80.0–100.0)
Monocytes Absolute: 0.2 10*3/uL (ref 0.1–1.0)
Monocytes Relative: 4 %
Neutro Abs: 4.2 10*3/uL (ref 1.7–7.7)
Neutrophils Relative %: 82 %
Platelet Count: 130 10*3/uL — ABNORMAL LOW (ref 150–400)
RBC: 3.86 MIL/uL — ABNORMAL LOW (ref 3.87–5.11)
RDW: 14.7 % (ref 11.5–15.5)
WBC Count: 5 10*3/uL (ref 4.0–10.5)
nRBC: 0 % (ref 0.0–0.2)

## 2018-05-31 MED ORDER — SODIUM CHLORIDE 0.9% FLUSH
10.0000 mL | INTRAVENOUS | Status: DC | PRN
  Administered 2018-05-31: 18:00:00 10 mL
  Filled 2018-05-31: qty 10

## 2018-05-31 MED ORDER — PALONOSETRON HCL INJECTION 0.25 MG/5ML
INTRAVENOUS | Status: AC
  Filled 2018-05-31: qty 5

## 2018-05-31 MED ORDER — SODIUM CHLORIDE 0.9 % IV SOLN
131.2500 mg/m2 | Freq: Once | INTRAVENOUS | Status: AC
  Administered 2018-05-31: 14:00:00 204 mg via INTRAVENOUS
  Filled 2018-05-31: qty 34

## 2018-05-31 MED ORDER — SODIUM CHLORIDE 0.9% FLUSH
10.0000 mL | Freq: Once | INTRAVENOUS | Status: AC
  Administered 2018-05-31: 10 mL
  Filled 2018-05-31: qty 10

## 2018-05-31 MED ORDER — FAMOTIDINE IN NACL 20-0.9 MG/50ML-% IV SOLN
20.0000 mg | Freq: Once | INTRAVENOUS | Status: AC
  Administered 2018-05-31: 20 mg via INTRAVENOUS

## 2018-05-31 MED ORDER — FAMOTIDINE IN NACL 20-0.9 MG/50ML-% IV SOLN
INTRAVENOUS | Status: AC
  Filled 2018-05-31: qty 50

## 2018-05-31 MED ORDER — SODIUM CHLORIDE 0.9 % IV SOLN
Freq: Once | INTRAVENOUS | Status: AC
  Administered 2018-05-31: 13:00:00 via INTRAVENOUS
  Filled 2018-05-31: qty 250

## 2018-05-31 MED ORDER — SODIUM CHLORIDE 0.9 % IV SOLN
Freq: Once | INTRAVENOUS | Status: AC
  Administered 2018-05-31: 13:00:00 via INTRAVENOUS
  Filled 2018-05-31: qty 5

## 2018-05-31 MED ORDER — HEPARIN SOD (PORK) LOCK FLUSH 100 UNIT/ML IV SOLN
500.0000 [IU] | Freq: Once | INTRAVENOUS | Status: AC | PRN
  Administered 2018-05-31: 18:00:00 500 [IU]
  Filled 2018-05-31: qty 5

## 2018-05-31 MED ORDER — DIPHENHYDRAMINE HCL 50 MG/ML IJ SOLN
INTRAMUSCULAR | Status: AC
  Filled 2018-05-31: qty 1

## 2018-05-31 MED ORDER — DIPHENHYDRAMINE HCL 50 MG/ML IJ SOLN
12.5000 mg | Freq: Once | INTRAMUSCULAR | Status: AC
  Administered 2018-05-31: 13:00:00 12.5 mg via INTRAVENOUS

## 2018-05-31 MED ORDER — PALONOSETRON HCL INJECTION 0.25 MG/5ML
0.2500 mg | Freq: Once | INTRAVENOUS | Status: AC
  Administered 2018-05-31: 13:00:00 0.25 mg via INTRAVENOUS

## 2018-05-31 MED ORDER — SODIUM CHLORIDE 0.9 % IV SOLN
402.0000 mg | Freq: Once | INTRAVENOUS | Status: AC
  Administered 2018-05-31: 400 mg via INTRAVENOUS
  Filled 2018-05-31: qty 40

## 2018-05-31 NOTE — Patient Instructions (Signed)
   [facility] Discharge Instructions for Patients Receiving Chemotherapy  Today you received the following chemotherapy agents Taxol and Carboplatin   To help prevent nausea and vomiting after your treatment, we encourage you to take your nausea medication as directed.    If you develop nausea and vomiting that is not controlled by your nausea medication, call the clinic.   BELOW ARE SYMPTOMS THAT SHOULD BE REPORTED IMMEDIATELY:  *FEVER GREATER THAN 100.5 F  *CHILLS WITH OR WITHOUT FEVER  NAUSEA AND VOMITING THAT IS NOT CONTROLLED WITH YOUR NAUSEA MEDICATION  *UNUSUAL SHORTNESS OF BREATH  *UNUSUAL BRUISING OR BLEEDING  TENDERNESS IN MOUTH AND THROAT WITH OR WITHOUT PRESENCE OF ULCERS  *URINARY PROBLEMS  *BOWEL PROBLEMS  UNUSUAL RASH Items with * indicate a potential emergency and should be followed up as soon as possible.  Feel free to call the clinic should you have any questions or concerns. The clinic phone number is [phone].  Please show the CHEMO ALERT CARD at check-in to the Emergency Department and triage nurse.   

## 2018-05-31 NOTE — Patient Instructions (Signed)

## 2018-06-01 ENCOUNTER — Telehealth: Payer: Self-pay | Admitting: Hematology and Oncology

## 2018-06-01 ENCOUNTER — Encounter: Payer: Self-pay | Admitting: Hematology and Oncology

## 2018-06-01 DIAGNOSIS — D61818 Other pancytopenia: Secondary | ICD-10-CM | POA: Insufficient documentation

## 2018-06-01 NOTE — Progress Notes (Signed)
[place] OFFICE PROGRESS NOTE  Patient Care Team: Martinique, Carmel G, MD as PCP - General (Family Medicine) Troy Sine, MD as PCP - Cardiology (Cardiology) Lavera Guise, [facility] as [place] Management (Pharmacist) Neldon Labella, RN as [place] Management Hess, Craige Cotta, RN as Oncology Nurse Navigator (Oncology)  ASSESSMENT & PLAN:  Right ovarian epithelial cancer [facility]) She tolerated treatment well without worsening side effects We will proceed with cycle 6 of treatment I plan to repeat imaging study in the month for further follow-up  Pancytopenia, acquired [facility]) She has worsening pancytopenia but not symptomatic We will proceed with treatment without dose adjustment  Peripheral neuropathy due to chemotherapy [facility]) she has mild peripheral neuropathy, likely related to side effects of treatment. It has been stable since last visit.  We will continue treatment as scheduled    Orders Placed This Encounter  Procedures  . CT ABDOMEN PELVIS W CONTRAST    Standing Status:   Future    Standing Expiration Date:   06/01/2019    Order Specific Question:   If indicated for the ordered procedure, I authorize the administration of contrast media per Radiology protocol    Answer:   Yes    Order Specific Question:   Preferred imaging location?    Answer:   [facility]    Order Specific Question:   Radiology Contrast Protocol - do NOT remove file path    Answer:   [email or web address]    INTERVAL HISTORY: Please see below for problem oriented charting. She returns for cycle 6 of chemo She tolerated last cycle of treatment well She complained of leg pain and neuropathy but not worse No recent infection, fever or chills She is inquiring about future plan of care  SUMMARY OF ONCOLOGIC HISTORY: Oncology History   Neg for genetics blood work and HRD     Right ovarian epithelial cancer ([place])   01/06/2018 Imaging    Ct abdomen and pelvis 1. Complex cystic mass at the right adnexa, measuring 5.9 x 4.0 cm, with nodular components, concerning for primary ovarian malignancy. 2. Diffuse nodularity along the omentum at the left side of the abdomen, extending into the mesentery at the left mid abdomen, concerning for peritoneal carcinomatosis. 3. Wall thickening at the distal ileum adjacent to the ovarian mass; bowel loops appear somewhat adherent to the ovarian mass. Bowel infiltration with tumor cannot be excluded. No evidence of bowel obstruction at this time. 4. Small volume ascites within the abdomen and pelvis.  Aortic Atherosclerosis (ICD10-I70.0).    01/08/2018 Tumor Marker    Patient's tumor was tested for the following markers: CA-125. Results of the tumor marker test revealed 3004    01/15/2018 Imaging    Chest CT:  1. No active cardiopulmonary disease. 2. Aortic atherosclerosis without aneurysm or dissection. 3. No large central pulmonary embolus.  CT AP:  1. Dilated fluid-filled loops of small bowel are redemonstrated slightly more extensive than on prior exam with transition point likely in the right adnexa adjacent to a complex cystic mass concerning for ovarian neoplasm given septations and soft tissue nodularity. This raises concern for early or partial SBO. This soft tissue mass measures 4.9 x 4 x 4.7 cm and has not changed since prior recent comparison. Additional short segmental area of luminal narrowing is noted in the right lower quadrant involving small bowel for which stigmata of peritoneal carcinomatosis or small-bowel metastatic implants might account for this. 2. Redemonstration of small  volume of ascites predominantly in the upper abdomen surrounding the liver and spleen. 3. Redemonstration of thick bandlike omental thickening concerning for peritoneal carcinomatosis.     01/15/2018 Procedure    Successful ultrasound-guided diagnostic and therapeutic  paracentesis yielding 1.5 liters of peritoneal fluid.     01/15/2018 Pathology Results    PERITONEAL/ASCITIC FLUID (SPECIMEN 1 OF 1 COLLECTED 01/18/18): MALIGNANT CELLS CONSISTENT WITH METASTATIC ADENOCARCINOMA. SEE COMMENT. COMMENT: THE MALIGNANT CELLS ARE POSITIVE FOR MOC-31, CYTOKERATIN 7, ESTROGEN RECEPTOR, PAX-8, AND WT-1. THEY ARE NEGATIVE FOR CALRETININ, CYTOKERATIN 5/6, AND CYTOKERATIN 20. THE PROFILE IS CONSISTENT WITH A PRIMARY GYNECOLOGIC CARCINOMA. THERE IS LIKELY SUFFICIENT TUMOR PRESENT, IF ADDITIONAL STUDIES ARE REQUESTED.    01/15/2018 - 02/02/2018 Hospital Admission    She was admitted to the hospital for SBO. She was treated with chemotherapy    01/18/2018 -  Chemotherapy    The patient had carboplatin and taxol    02/08/2018 Cancer Staging    Staging form: Ovary, Fallopian Tube, and Primary Peritoneal Carcinoma, AJCC 8th Edition - Clinical: cT3, cN0, cM0 - Signed by Heath Lark, MD on 02/08/2018    02/08/2018 Tumor Marker    Patient's tumor was tested for the following markers: CA-125. Results of the tumor marker test revealed 445    03/02/2018 Tumor Marker    Patient's tumor was tested for the following markers: CA-125. Results of the tumor marker test revealed 174    03/15/2018 Imaging    6.2 cm complex cystic right ovarian mass, compatible with malignant ovarian neoplasm, mildly progressive.  Associated peritoneal disease/omental caking beneath the anterior abdominal wall, mildly improved. Prior abdominal ascites is improved/resolved.  4.7 cm cystic left ovarian mass, without overt malignant features, grossly unchanged.  Mild bilateral hydroureteronephrosis, secondary to extrinsic compression, new.  Prior small bowel obstruction has improved/resolved.     03/27/2018 Pathology Results    1. Omentum, resection for tumor - OMENTUM: - HIGH GRADE SEROUS CARCINOMA. 2. Ovary, left - LEFT OVARY: - HIGH GRADE SEROUS CARCINOMA. - LEFT FALLOPIAN TUBE: - HIGH GRADE  SEROUS CARCINOMA. 3. Adnexa - ovary +/- tube, neoplastic, right - RIGHT OVARY: - HIGH GRADE SEROUS CARCINOMA, 5.5 CM. - RIGHT FALLOPIAN TUBE: - HIGH GRADE SEROUS CARCINOMA. 4. Peritoneum, biopsy, nodule - PERITONEUM: - NODULES OF HIGH GRADE SEROUS CARCINOMA. 5. Peritoneum, resection for tumor, rectosigmoid - RECTOSIGMOID PERITONEUM: - HIGH GRADE SEROUS CARCINOMA. Microscopic Comment 3. OVARY or FALLOPIAN TUBE or PRIMARY PERITONEUM: Procedure: Bilateral salpingo-oophorectomy, omentectomy and peritoneal biopsies. Specimen Integrity: N/A. Tumor Site: Right ovary. Ovarian Surface Involvement (required only if applicable): Yes. Fallopian Tube Surface Involvement (required only if applicable): Yes. Tumor Size: 5.5 x 4.8 x 4.0 cm. Histologic Type: Serous carcinoma. Histologic Grade: High grade. Implants (required for advanced stage serous/seromucinous borderline tumors only): Omentum, left ovary and fallopian tube, rectosigmoid peritoneum and peritoneum. Other Tissue/ Organ Involvement: Left ovary and fallopian tube, omentum and rectosigmoid peritoneum. Largest Extrapelvic Peritoneal Focus (required only if applicable): 81.8 cm, omentum. Peritoneal/Ascitic Fluid: N/A. Treatment Effect (required only for high-grade serous carcinomas): Minimal. Regional Lymph Nodes: No lymph nodes submitted. Pathologic Stage Classification (pTNM, AJCC 8th Edition): pT3c, pNX. Representative Tumor Block: 3A, 3B, 3D, 3E and 33F. Comment(s): There is a 5.5 cm in greatest dimension partially cystic high grade serous carcinoma involving the right adnexal specimen and the tumor is staged as a primary right ovarian carcinoma. The carcinoma also involves the left ovary as well as the left fallopian tube, the omentum, peritoneum and the rectosigmoid peritoneal specimens.  03/27/2018 Surgery    Preoperative Diagnosis: ovarian cancer, stage IIIC, metastatic to omentum, peritoneum, serosa of intestines   Procedure(s)  Performed: 1. Exploratory laparotomy with bilateral salpingo-oophorectomy, omentectomy radical tumor debulking for ovarian cancer .  Surgeon: Thereasa Solo, MD.   Specimens: Bilateral tubes / ovaries, omentum. Peritoneal nodules, rectosigmoid nodules.    Operative Findings: omental cake, miliary studding on diaphragm and bilateral paracolic gutters. Sigmoid colon densely adherent to left and right ovaries with tumor rind, ureters mildly dilated bilaterally due to retroperitoneal extension of the tumor towards ureters causing compression.    This represented an optimal cytoreduction (R1) with gross residual disease on the diaphragm that had been ablated and a thin tumor plaque on the sigmoid colon that was ablated. No residual disease >1cm remaining    05/07/2018 Genetic Testing    Negative genetic testing on the [employer] TumorNextHRD+ CancerNext Panel. The CancerNext gene panel offered by [employer] includes sequencing and rearrangement analysis for the following 34 genes:   APC, ATM, BARD1, BMPR1A, BRCA1, BRCA2, BRIP1, CDH1, CDK4, CDKN2A, CHEK2, DICER1, EPCAM, GREM1, HOXB13, MLH1, MRE11A, MSH2, MSH6, MUTYH, NBN, NF1, PALB2, PMS2, POLD1, POLE, PTEN, RAD50, RAD51C, RAD51D, SMAD4, SMARCA4, STK11, and TP53. Somatic genes analyzed through TumorNext-HRD: ATM, BARD1, BRCA1, BRCA2, BRIP1, CHEK2, MRE11A, NBN, PALB2, RAD51C, RAD51D. The report date is 05/07/2018.    05/11/2018 Tumor Marker    Patient's tumor was tested for the following markers: CA-125. Results of the tumor marker test revealed 60.5     REVIEW OF SYSTEMS:   Constitutional: Denies fevers, chills or abnormal weight loss Eyes: Denies blurriness of vision Ears, nose, mouth, throat, and face: Denies mucositis or sore throat Respiratory: Denies cough, dyspnea or wheezes Cardiovascular: Denies palpitation, chest discomfort or lower extremity swelling Gastrointestinal:  Denies nausea, heartburn or change in bowel habits Skin: Denies abnormal  skin rashes Lymphatics: Denies new lymphadenopathy or easy bruising Behavioral/Psych: Mood is stable, no new changes  All other systems were reviewed with the patient and are negative.  I have reviewed the past medical history, past surgical history, social history and family history with the patient and they are unchanged from previous note.  ALLERGIES:  is allergic to tramadol hcl.  MEDICATIONS:  Current Outpatient Medications  Medication Sig Dispense Refill  . acetaminophen (TYLENOL) 500 MG tablet Take 2 tablets (1,000 mg total) by mouth every 6 (six) hours. (Patient taking differently: Take 1,000 mg by mouth every 6 (six) hours as needed. ) 30 tablet 0  . ALPRAZolam (XANAX) 0.5 MG tablet Take 0.5-1 tablets (0.25-0.5 mg total) by mouth daily as needed for anxiety. 30 tablet 2  . alum & mag hydroxide-simeth (MAALOX/MYLANTA) 200-200-20 MG/5ML suspension Take 30 mLs by mouth every 6 (six) hours as needed for indigestion or heartburn. 355 mL 0  . Calcium Carbonate (CALTRATE 600) 1500 MG TABS Take 600 mg of elemental calcium by mouth daily.     . Carboxymethylcellulose Sodium (EYE DROPS OP) Place 1 drop into both eyes daily as needed (dry eyes).     . cholecalciferol (VITAMIN D) 1000 UNITS tablet Take 1,000 Units by mouth daily.     Marland Kitchen dexamethasone (DECADRON) 4 MG tablet Take 2 tabs at the night before and 2 tabs the morning of chemotherapy, every 3 weeks, by mouth 20 tablet 0  . ferrous gluconate (FERGON) 324 MG tablet Take 1 tablet (324 mg total) by mouth 3 (three) times daily with meals. 90 tablet 3  . fluticasone (FLONASE) 50 MCG/ACT nasal spray Use 2  sprays in each nostril daily (Patient taking differently: Place 2 sprays into both nostrils daily. ) 48 g 2  . hydrocortisone (ANUSOL-HC) 25 MG suppository Place 1 suppository (25 mg total) rectally 2 (two) times daily as needed for hemorrhoids or anal itching. 12 suppository 1  . ibuprofen (ADVIL,MOTRIN) 200 MG tablet Take 600 mg by mouth every  6 (six) hours as needed for moderate pain.     Marland Kitchen lactose free nutrition (BOOST PLUS) LIQD Take 237 mLs by mouth daily. (Patient taking differently: Take 237 mLs by mouth 2 (two) times daily between meals. ) 10 Can 0  . levothyroxine (SYNTHROID, LEVOTHROID) 75 MCG tablet Take 1 tablet (75 mcg total) by mouth daily. (Patient taking differently: Take 75 mcg by mouth daily before breakfast. ) 90 tablet 3  . lidocaine-prilocaine (EMLA) cream Apply 1 application topically as needed. 30 g 6  . Magnesium 400 MG TABS Take 250 mg by mouth daily.    . Menthol-Methyl Salicylate (MUSCLE RUB) 10-15 % CREA Apply 1 application topically as needed for muscle pain.    . metoprolol succinate (TOPROL-XL) 50 MG 24 hr tablet Take 1 tablet (50 mg total) by mouth daily. Take with or immediately following a meal. 90 tablet 3  . Multiple Vitamins-Minerals (MULTIVITAMIN WITH MINERALS) tablet Take 1 tablet by mouth daily.      . Omega-3 Fatty Acids (FISH OIL) 1000 MG CAPS Take 2,000 mg by mouth 2 (two) times daily.     Marland Kitchen omeprazole (PRILOSEC) 40 MG capsule Take 1 capsule (40 mg total) by mouth daily. 90 capsule 3  . ondansetron (ZOFRAN) 8 MG tablet Take 1 tablet (8 mg total) by mouth every 8 (eight) hours as needed for nausea. 90 tablet 3  . oxyCODONE (OXY IR/ROXICODONE) 5 MG immediate release tablet Take 1 tablet (5 mg total) by mouth every 6 (six) hours as needed for severe pain. 30 tablet 0  . polyethylene glycol (MIRALAX / GLYCOLAX) packet Take 17 g by mouth daily. (Patient taking differently: Take 17 g by mouth daily as needed for moderate constipation. ) 14 each 0  . prochlorperazine (COMPAZINE) 10 MG tablet Take 1 tablet (10 mg total) by mouth every 6 (six) hours as needed for nausea or vomiting. (Patient not taking: Reported on 05/02/2018) 60 tablet 11  . rivaroxaban (XARELTO) 20 MG TABS tablet Take 1 tablet (20 mg total) by mouth daily with supper. 14 tablet 0  . senna (SENOKOT) 8.6 MG TABS tablet Take 1 tablet (8.6 mg  total) by mouth at bedtime as needed for mild constipation. 120 each 0  . senna-docusate (SENOKOT-S) 8.6-50 MG tablet Take 2 tablets by mouth at bedtime. For AFTER surgery 30 tablet 0  . simvastatin (ZOCOR) 20 MG tablet Take 1 tablet (20 mg total) by mouth at bedtime. 90 tablet 3  . vitamin C (ASCORBIC ACID) 500 MG tablet Take 1,000 mg by mouth daily.     No current facility-administered medications for this visit.     PHYSICAL EXAMINATION: ECOG PERFORMANCE STATUS: 1 - Symptomatic but completely ambulatory  Vitals:   05/31/18 1148  BP: 131/74  Pulse: 86  Resp: 18  Temp: 98.3 F (36.8 C)  SpO2: 99%   Filed Weights   05/31/18 1148  Weight: 132 lb 11.2 oz (60.2 kg)    GENERAL:alert, no distress and comfortable SKIN: skin color, texture, turgor are normal, no rashes or significant lesions EYES: normal, Conjunctiva are pink and non-injected, sclera clear OROPHARYNX:no exudate, no erythema and lips, buccal  mucosa, and tongue normal  NECK: supple, thyroid normal size, non-tender, without nodularity LYMPH:  no palpable lymphadenopathy in the cervical, axillary or inguinal LUNGS: clear to auscultation and percussion with normal breathing effort HEART: regular rate & rhythm and no murmurs and no lower extremity edema ABDOMEN:abdomen soft, non-tender and normal bowel sounds Musculoskeletal:no cyanosis of digits and no clubbing  NEURO: alert & oriented x 3 with fluent speech, no focal motor/sensory deficits  LABORATORY DATA:  I have reviewed the data as listed    Component Value Date/Time   NA 139 05/31/2018 1125   K 3.8 05/31/2018 1125   CL 107 05/31/2018 1125   CO2 23 05/31/2018 1125   GLUCOSE 153 (H) 05/31/2018 1125   BUN 12 05/31/2018 1125   CREATININE 0.71 05/31/2018 1125   CREATININE 0.76 11/17/2015 1005   CALCIUM 9.1 05/31/2018 1125   PROT 7.1 05/31/2018 1125   ALBUMIN 3.6 05/31/2018 1125   AST 11 (L) 05/31/2018 1125   ALT 13 05/31/2018 1125   ALKPHOS 83 05/31/2018  1125   BILITOT 0.2 (L) 05/31/2018 1125   GFRNONAA >60 05/31/2018 1125   GFRAA >60 05/31/2018 1125    No results found for: SPEP, UPEP  Lab Results  Component Value Date   WBC 5.0 05/31/2018   NEUTROABS 4.2 05/31/2018   HGB 11.6 (L) 05/31/2018   HCT 36.8 05/31/2018   MCV 95.3 05/31/2018   PLT 130 (L) 05/31/2018      Chemistry      Component Value Date/Time   NA 139 05/31/2018 1125   K 3.8 05/31/2018 1125   CL 107 05/31/2018 1125   CO2 23 05/31/2018 1125   BUN 12 05/31/2018 1125   CREATININE 0.71 05/31/2018 1125   CREATININE 0.76 11/17/2015 1005      Component Value Date/Time   CALCIUM 9.1 05/31/2018 1125   ALKPHOS 83 05/31/2018 1125   AST 11 (L) 05/31/2018 1125   ALT 13 05/31/2018 1125   BILITOT 0.2 (L) 05/31/2018 1125       All questions were answered. The patient knows to call the clinic with any problems, questions or concerns. No barriers to learning was detected.  I spent 25 minutes counseling the patient face to face. The total time spent in the appointment was 30 minutes and more than 50% was on counseling and review of test results  Heath Lark, MD 06/01/2018 11:00 AM

## 2018-06-01 NOTE — Assessment & Plan Note (Signed)
she has mild peripheral neuropathy, likely related to side effects of treatment. It has been stable since last visit.  We will continue treatment as scheduled

## 2018-06-01 NOTE — Assessment & Plan Note (Signed)
She has worsening pancytopenia but not symptomatic We will proceed with treatment without dose adjustment

## 2018-06-01 NOTE — Telephone Encounter (Signed)
Called regarding schedule °

## 2018-06-01 NOTE — Assessment & Plan Note (Signed)
She tolerated treatment well without worsening side effects We will proceed with cycle 6 of treatment I plan to repeat imaging study in the month for further follow-up

## 2018-06-08 ENCOUNTER — Other Ambulatory Visit: Payer: Self-pay

## 2018-06-08 NOTE — Patient Outreach (Signed)
Haley [name] [facility]) Care Management  06/08/2018  [place] March 17, 1945 [id number]   Successful outreach with Haley [name]. She reports completing her last chemotherapy treatment on 05/31/18. Today she states, "I feel good." She denies changes in care management needs since last outreach. She remains compliant with medications and treatment recommendations. She will follow up with Dr. Alvy Bimler in June.  Haley [name] was tentatively scheduled for case closure this month. Per [facility] Assistant Clinical Director, [employer] members will transition to [facility] for care management services in June 2020. Haley [name] is aware. As requested, the case will remain open until the June transition.  PLAN -Will complete transition of services next month.   [place] [phone]

## 2018-06-12 ENCOUNTER — Ambulatory Visit: Payer: Self-pay | Admitting: Pharmacist

## 2018-06-25 ENCOUNTER — Ambulatory Visit: Payer: Self-pay | Admitting: Pharmacist

## 2018-06-27 NOTE — Patient Outreach (Signed)
[facility]) Care Management  06/27/2018  [place] [birth year] [id number]     Case Closure Care management services have transitioned to [facility]. [facility] CM case closure complete.   [place] [phone]

## 2018-07-02 ENCOUNTER — Encounter (HOSPITAL_COMMUNITY): Payer: Self-pay

## 2018-07-02 ENCOUNTER — Inpatient Hospital Stay: Payer: PPO

## 2018-07-02 ENCOUNTER — Other Ambulatory Visit: Payer: Self-pay

## 2018-07-02 ENCOUNTER — Inpatient Hospital Stay: Payer: PPO | Attending: Obstetrics

## 2018-07-02 ENCOUNTER — Ambulatory Visit (HOSPITAL_COMMUNITY)
Admission: RE | Admit: 2018-07-02 | Discharge: 2018-07-02 | Disposition: A | Discharge status: Home / Self Care | Payer: PPO | Source: Ambulatory Visit | Attending: Hematology and Oncology | Admitting: Hematology and Oncology

## 2018-07-02 DIAGNOSIS — Z8543 Personal history of malignant neoplasm of ovary: Secondary | ICD-10-CM | POA: Diagnosis not present

## 2018-07-02 DIAGNOSIS — C561 Malignant neoplasm of right ovary: Secondary | ICD-10-CM

## 2018-07-02 DIAGNOSIS — G620 Drug-induced polyneuropathy: Secondary | ICD-10-CM | POA: Diagnosis not present

## 2018-07-02 DIAGNOSIS — M79606 Pain in leg, unspecified: Secondary | ICD-10-CM

## 2018-07-02 DIAGNOSIS — C786 Secondary malignant neoplasm of retroperitoneum and peritoneum: Secondary | ICD-10-CM | POA: Diagnosis not present

## 2018-07-02 DIAGNOSIS — T451X5D Adverse effect of antineoplastic and immunosuppressive drugs, subsequent encounter: Secondary | ICD-10-CM | POA: Insufficient documentation

## 2018-07-02 DIAGNOSIS — Z79899 Other long term (current) drug therapy: Secondary | ICD-10-CM | POA: Diagnosis not present

## 2018-07-02 DIAGNOSIS — Z7951 Long term (current) use of inhaled steroids: Secondary | ICD-10-CM | POA: Diagnosis not present

## 2018-07-02 DIAGNOSIS — Z7901 Long term (current) use of anticoagulants: Secondary | ICD-10-CM | POA: Diagnosis not present

## 2018-07-02 DIAGNOSIS — C569 Malignant neoplasm of unspecified ovary: Secondary | ICD-10-CM

## 2018-07-02 DIAGNOSIS — K668 Other specified disorders of peritoneum: Secondary | ICD-10-CM | POA: Diagnosis not present

## 2018-07-02 LAB — CBC WITH DIFFERENTIAL (CANCER CENTER ONLY)
Abs Immature Granulocytes: 0.03 10*3/uL (ref 0.00–0.07)
Basophils Absolute: 0.1 10*3/uL (ref 0.0–0.1)
Basophils Relative: 1 %
Eosinophils Absolute: 0.4 10*3/uL (ref 0.0–0.5)
Eosinophils Relative: 7 %
HCT: 37 % (ref 36.0–46.0)
Hemoglobin: 11.6 g/dL — ABNORMAL LOW (ref 12.0–15.0)
Immature Granulocytes: 1 %
Lymphocytes Relative: 30 %
Lymphs Abs: 1.7 10*3/uL (ref 0.7–4.0)
MCH: 30.3 pg (ref 26.0–34.0)
MCHC: 31.4 g/dL (ref 30.0–36.0)
MCV: 96.6 fL (ref 80.0–100.0)
Monocytes Absolute: 0.7 10*3/uL (ref 0.1–1.0)
Monocytes Relative: 12 %
Neutro Abs: 2.8 10*3/uL (ref 1.7–7.7)
Neutrophils Relative %: 49 %
Platelet Count: 159 10*3/uL (ref 150–400)
RBC: 3.83 MIL/uL — ABNORMAL LOW (ref 3.87–5.11)
RDW: 15.6 % — ABNORMAL HIGH (ref 11.5–15.5)
WBC Count: 5.6 10*3/uL (ref 4.0–10.5)
nRBC: 0 % (ref 0.0–0.2)

## 2018-07-02 LAB — CMP (CANCER CENTER ONLY)
ALT: 10 U/L (ref 0–44)
AST: 11 U/L — ABNORMAL LOW (ref 15–41)
Albumin: 3.6 g/dL (ref 3.5–5.0)
Alkaline Phosphatase: 81 U/L (ref 38–126)
Anion gap: 7 (ref 5–15)
BUN: 10 mg/dL (ref 8–23)
CO2: 25 mmol/L (ref 22–32)
Calcium: 9.1 mg/dL (ref 8.9–10.3)
Chloride: 108 mmol/L (ref 98–111)
Creatinine: 0.74 mg/dL (ref 0.44–1.00)
GFR, Est AFR Am: >60 mL/min (ref >60)
GFR, Estimated: >60 mL/min (ref >60)
Glucose, Bld: 86 mg/dL (ref 70–99)
Potassium: 4 mmol/L (ref 3.5–5.1)
Sodium: 140 mmol/L (ref 135–145)
Total Bilirubin: 0.4 mg/dL (ref 0.3–1.2)
Total Protein: 6.8 g/dL (ref 6.5–8.1)

## 2018-07-02 MED ORDER — SODIUM CHLORIDE (PF) 0.9 % IJ SOLN
INTRAMUSCULAR | Status: AC
  Filled 2018-07-02: qty 50

## 2018-07-02 MED ORDER — IOHEXOL 300 MG/ML  SOLN
100.0000 mL | Freq: Once | INTRAMUSCULAR | Status: AC | PRN
  Administered 2018-07-02: 100 mL via INTRAVENOUS

## 2018-07-02 MED ORDER — HEPARIN SOD (PORK) LOCK FLUSH 100 UNIT/ML IV SOLN
INTRAVENOUS | Status: AC
  Administered 2018-07-02: 500 [IU]
  Filled 2018-07-02: qty 5

## 2018-07-02 MED ORDER — HEPARIN SOD (PORK) LOCK FLUSH 100 UNIT/ML IV SOLN
500.0000 [IU] | Freq: Once | INTRAVENOUS | Status: AC
  Administered 2018-07-02: 500 [IU] via INTRAVENOUS

## 2018-07-02 MED ORDER — SODIUM CHLORIDE 0.9% FLUSH
10.0000 mL | Freq: Once | INTRAVENOUS | Status: AC
  Administered 2018-07-02: 10 mL
  Filled 2018-07-02: qty 10

## 2018-07-03 ENCOUNTER — Other Ambulatory Visit: Payer: Self-pay

## 2018-07-03 ENCOUNTER — Inpatient Hospital Stay (HOSPITAL_BASED_OUTPATIENT_CLINIC_OR_DEPARTMENT_OTHER): Payer: PPO | Admitting: Hematology and Oncology

## 2018-07-03 ENCOUNTER — Encounter: Payer: Self-pay | Admitting: Hematology and Oncology

## 2018-07-03 ENCOUNTER — Ambulatory Visit: Payer: Self-pay | Admitting: Pharmacist

## 2018-07-03 DIAGNOSIS — Z79899 Other long term (current) drug therapy: Secondary | ICD-10-CM

## 2018-07-03 DIAGNOSIS — C786 Secondary malignant neoplasm of retroperitoneum and peritoneum: Secondary | ICD-10-CM | POA: Diagnosis not present

## 2018-07-03 DIAGNOSIS — Z7901 Long term (current) use of anticoagulants: Secondary | ICD-10-CM

## 2018-07-03 DIAGNOSIS — C561 Malignant neoplasm of right ovary: Secondary | ICD-10-CM | POA: Diagnosis not present

## 2018-07-03 DIAGNOSIS — Z7189 Other specified counseling: Secondary | ICD-10-CM | POA: Insufficient documentation

## 2018-07-03 DIAGNOSIS — Z7951 Long term (current) use of inhaled steroids: Secondary | ICD-10-CM

## 2018-07-03 DIAGNOSIS — G620 Drug-induced polyneuropathy: Secondary | ICD-10-CM | POA: Diagnosis not present

## 2018-07-03 DIAGNOSIS — T451X5A Adverse effect of antineoplastic and immunosuppressive drugs, initial encounter: Secondary | ICD-10-CM

## 2018-07-03 LAB — CA 125: Cancer Antigen (CA) 125: 42.7 U/mL — ABNORMAL HIGH (ref 0.0–38.1)

## 2018-07-03 NOTE — Assessment & Plan Note (Signed)
Unfortunately, CT imaging show residual disease Her tumor marker remains elevated We discussed the incurable nature of her disease I reviewed with her the current guidelines The patient is interested to pursue further chemotherapy We discussed the risks, benefits, side effects of chemotherapy choices of Taxotere, gemcitabine or liposomal doxorubicin with or without bevacizumab Some of the risks that were discussed including risk of pancytopenia, infection, hair loss, nausea, neuropathy, risk of congestive heart failure and death and recheck recent hospitalization Ultimately, the patient is undecided I have written choices on a piece of paper Per patient request, I will call her tomorrow for her final treatment decision

## 2018-07-03 NOTE — Assessment & Plan Note (Signed)
She denies residual neuropathy from treatment Observe only for now

## 2018-07-03 NOTE — Assessment & Plan Note (Signed)
We have further discussions about goals of care The patient has platinum refractory disease We discussed prognosis with or without chemotherapy She will go home and think about it.

## 2018-07-03 NOTE — Progress Notes (Signed)
[facility] OFFICE PROGRESS NOTE  Patient Care Team: Martinique, Letetia G, MD as PCP - General (Family Medicine) Troy Sine, MD as PCP - Cardiology (Cardiology) Awanda Mink Craige Cotta, RN as Oncology Nurse Navigator (Oncology)  ASSESSMENT & PLAN:  Right ovarian epithelial cancer [facility] Unfortunately, CT imaging show residual disease Her tumor marker remains elevated We discussed the incurable nature of her disease I reviewed with her the current guidelines The patient is interested to pursue further chemotherapy We discussed the risks, benefits, side effects of chemotherapy choices of Taxotere, gemcitabine or liposomal doxorubicin with or without bevacizumab Some of the risks that were discussed including risk of pancytopenia, infection, hair loss, nausea, neuropathy, risk of congestive heart failure and death and recheck recent hospitalization Ultimately, the patient is undecided I have written choices on a piece of paper Per patient request, I will call her tomorrow for her final treatment decision  Peripheral neuropathy due to chemotherapy [facility]) She denies residual neuropathy from treatment Observe only for now  Goals of care, counseling/discussion We have further discussions about goals of care The patient has platinum refractory disease We discussed prognosis with or without chemotherapy She will go home and think about it.   No orders of the defined types were placed in this encounter.   INTERVAL HISTORY: Please see below for problem oriented charting. She returns to review test results She tolerated last cycle of treatment well She denies significant neuropathy from recent treatment No recent nausea, vomiting, pain or changes in bowel habits  SUMMARY OF ONCOLOGIC HISTORY: Oncology History Overview Note  Neg for genetics blood work and HRD   Right ovarian epithelial cancer ([place])  01/06/2018 Imaging   Ct abdomen and pelvis 1. Complex cystic mass at the right  adnexa, measuring 5.9 x 4.0 cm, with nodular components, concerning for primary ovarian malignancy. 2. Diffuse nodularity along the omentum at the left side of the abdomen, extending into the mesentery at the left mid abdomen, concerning for peritoneal carcinomatosis. 3. Wall thickening at the distal ileum adjacent to the ovarian mass; bowel loops appear somewhat adherent to the ovarian mass. Bowel infiltration with tumor cannot be excluded. No evidence of bowel obstruction at this time. 4. Small volume ascites within the abdomen and pelvis.  Aortic Atherosclerosis (ICD10-I70.0).   01/08/2018 Tumor Marker   Patient's tumor was tested for the following markers: CA-125. Results of the tumor marker test revealed 3004   01/15/2018 Imaging   Chest CT:  1. No active cardiopulmonary disease. 2. Aortic atherosclerosis without aneurysm or dissection. 3. No large central pulmonary embolus.  CT AP:  1. Dilated fluid-filled loops of small bowel are redemonstrated slightly more extensive than on prior exam with transition point likely in the right adnexa adjacent to a complex cystic mass concerning for ovarian neoplasm given septations and soft tissue nodularity. This raises concern for early or partial SBO. This soft tissue mass measures 4.9 x 4 x 4.7 cm and has not changed since prior recent comparison. Additional short segmental area of luminal narrowing is noted in the right lower quadrant involving small bowel for which stigmata of peritoneal carcinomatosis or small-bowel metastatic implants might account for this. 2. Redemonstration of small volume of ascites predominantly in the upper abdomen surrounding the liver and spleen. 3. Redemonstration of thick bandlike omental thickening concerning for peritoneal carcinomatosis.    01/15/2018 Procedure   Successful ultrasound-guided diagnostic and therapeutic paracentesis yielding 1.5 liters of peritoneal fluid.    01/15/2018 Pathology Results  PERITONEAL/ASCITIC FLUID (SPECIMEN 1 OF 1 COLLECTED 01/18/18): MALIGNANT CELLS CONSISTENT WITH METASTATIC ADENOCARCINOMA. SEE COMMENT. COMMENT: THE MALIGNANT CELLS ARE POSITIVE FOR MOC-31, CYTOKERATIN 7, ESTROGEN RECEPTOR, PAX-8, AND WT-1. THEY ARE NEGATIVE FOR CALRETININ, CYTOKERATIN 5/6, AND CYTOKERATIN 20. THE PROFILE IS CONSISTENT WITH A PRIMARY GYNECOLOGIC CARCINOMA. THERE IS LIKELY SUFFICIENT TUMOR PRESENT, IF ADDITIONAL STUDIES ARE REQUESTED.   01/15/2018 - 02/02/2018 Hospital Admission   She was admitted to the hospital for SBO. She was treated with chemotherapy   01/18/2018 - 06/20/2018 Chemotherapy   The patient had carboplatin and taxol   02/08/2018 Cancer Staging   Staging form: Ovary, Fallopian Tube, and Primary Peritoneal Carcinoma, AJCC 8th Edition - Clinical: cT3, cN0, cM0 - Signed by Heath Lark, MD on 02/08/2018   02/08/2018 Tumor Marker   Patient's tumor was tested for the following markers: CA-125. Results of the tumor marker test revealed 445   03/02/2018 Tumor Marker   Patient's tumor was tested for the following markers: CA-125. Results of the tumor marker test revealed 174   03/15/2018 Imaging   6.2 cm complex cystic right ovarian mass, compatible with malignant ovarian neoplasm, mildly progressive.  Associated peritoneal disease/omental caking beneath the anterior abdominal wall, mildly improved. Prior abdominal ascites is improved/resolved.  4.7 cm cystic left ovarian mass, without overt malignant features, grossly unchanged.  Mild bilateral hydroureteronephrosis, secondary to extrinsic compression, new.  Prior small bowel obstruction has improved/resolved.    03/27/2018 Pathology Results   1. Omentum, resection for tumor - OMENTUM: - HIGH GRADE SEROUS CARCINOMA. 2. Ovary, left - LEFT OVARY: - HIGH GRADE SEROUS CARCINOMA. - LEFT FALLOPIAN TUBE: - HIGH GRADE SEROUS CARCINOMA. 3. Adnexa - ovary +/- tube, neoplastic, right - RIGHT OVARY: - HIGH GRADE SEROUS  CARCINOMA, 5.5 CM. - RIGHT FALLOPIAN TUBE: - HIGH GRADE SEROUS CARCINOMA. 4. Peritoneum, biopsy, nodule - PERITONEUM: - NODULES OF HIGH GRADE SEROUS CARCINOMA. 5. Peritoneum, resection for tumor, rectosigmoid - RECTOSIGMOID PERITONEUM: - HIGH GRADE SEROUS CARCINOMA. Microscopic Comment 3. OVARY or FALLOPIAN TUBE or PRIMARY PERITONEUM: Procedure: Bilateral salpingo-oophorectomy, omentectomy and peritoneal biopsies. Specimen Integrity: N/A. Tumor Site: Right ovary. Ovarian Surface Involvement (required only if applicable): Yes. Fallopian Tube Surface Involvement (required only if applicable): Yes. Tumor Size: 5.5 x 4.8 x 4.0 cm. Histologic Type: Serous carcinoma. Histologic Grade: High grade. Implants (required for advanced stage serous/seromucinous borderline tumors only): Omentum, left ovary and fallopian tube, rectosigmoid peritoneum and peritoneum. Other Tissue/ Organ Involvement: Left ovary and fallopian tube, omentum and rectosigmoid peritoneum. Largest Extrapelvic Peritoneal Focus (required only if applicable): 84.1 cm, omentum. Peritoneal/Ascitic Fluid: N/A. Treatment Effect (required only for high-grade serous carcinomas): Minimal. Regional Lymph Nodes: No lymph nodes submitted. Pathologic Stage Classification (pTNM, AJCC 8th Edition): pT3c, pNX. Representative Tumor Block: 3A, 3B, 3D, 3E and 73F. Comment(s): There is a 5.5 cm in greatest dimension partially cystic high grade serous carcinoma involving the right adnexal specimen and the tumor is staged as a primary right ovarian carcinoma. The carcinoma also involves the left ovary as well as the left fallopian tube, the omentum, peritoneum and the rectosigmoid peritoneal specimens.   03/27/2018 Surgery   Preoperative Diagnosis: ovarian cancer, stage IIIC, metastatic to omentum, peritoneum, serosa of intestines   Procedure(s) Performed: 1. Exploratory laparotomy with bilateral salpingo-oophorectomy, omentectomy radical tumor  debulking for ovarian cancer .  Surgeon: Thereasa Solo, MD.   Specimens: Bilateral tubes / ovaries, omentum. Peritoneal nodules, rectosigmoid nodules.    Operative Findings: omental cake, miliary studding on diaphragm and bilateral paracolic gutters. Sigmoid  colon densely adherent to left and right ovaries with tumor rind, ureters mildly dilated bilaterally due to retroperitoneal extension of the tumor towards ureters causing compression.    This represented an optimal cytoreduction (R1) with gross residual disease on the diaphragm that had been ablated and a thin tumor plaque on the sigmoid colon that was ablated. No residual disease >1cm remaining   05/07/2018 Genetic Testing   Negative genetic testing on the [employer] TumorNextHRD+ CancerNext Panel. The CancerNext gene panel offered by [employer] includes sequencing and rearrangement analysis for the following 34 genes:   APC, ATM, BARD1, BMPR1A, BRCA1, BRCA2, BRIP1, CDH1, CDK4, CDKN2A, CHEK2, DICER1, EPCAM, GREM1, HOXB13, MLH1, MRE11A, MSH2, MSH6, MUTYH, NBN, NF1, PALB2, PMS2, POLD1, POLE, PTEN, RAD50, RAD51C, RAD51D, SMAD4, SMARCA4, STK11, and TP53. Somatic genes analyzed through TumorNext-HRD: ATM, BARD1, BRCA1, BRCA2, BRIP1, CHEK2, MRE11A, NBN, PALB2, RAD51C, RAD51D. The report date is 05/07/2018.   05/11/2018 Tumor Marker   Patient's tumor was tested for the following markers: CA-125. Results of the tumor marker test revealed 60.5   07/02/2018 Imaging   1. Mild right pericardiophrenic adenopathy is mildly increased, suspicious for metastatic nodes. No abdominopelvic adenopathy. 2. Small left peritoneal soft tissue nodule adjacent to the splenic flexure and smooth left pelvic sidewall peritoneal thickening, cannot exclude residual/recurrent disease. Attention on follow-up CT advised. 3. Bilateral renal collecting system dilatation has improved. 4.  Aortic Atherosclerosis (ICD10-I70.0).   07/02/2018 Tumor Marker   Patient's tumor was tested  for the following markers: CA-125 Results of the tumor marker test revealed 42.7     REVIEW OF SYSTEMS:   Constitutional: Denies fevers, chills or abnormal weight loss Eyes: Denies blurriness of vision Ears, nose, mouth, throat, and face: Denies mucositis or sore throat Respiratory: Denies cough, dyspnea or wheezes Cardiovascular: Denies palpitation, chest discomfort or lower extremity swelling Gastrointestinal:  Denies nausea, heartburn or change in bowel habits Skin: Denies abnormal skin rashes Lymphatics: Denies new lymphadenopathy or easy bruising Neurological:Denies numbness, tingling or new weaknesses Behavioral/Psych: Mood is stable, no new changes  All other systems were reviewed with the patient and are negative.  I have reviewed the past medical history, past surgical history, social history and family history with the patient and they are unchanged from previous note.  ALLERGIES:  is allergic to tramadol hcl.  MEDICATIONS:  Current Outpatient Medications  Medication Sig Dispense Refill  . acetaminophen (TYLENOL) 500 MG tablet Take 2 tablets (1,000 mg total) by mouth every 6 (six) hours. (Patient taking differently: Take 1,000 mg by mouth every 6 (six) hours as needed. ) 30 tablet 0  . ALPRAZolam (XANAX) 0.5 MG tablet Take 0.5-1 tablets (0.25-0.5 mg total) by mouth daily as needed for anxiety. 30 tablet 2  . alum & mag hydroxide-simeth (MAALOX/MYLANTA) 200-200-20 MG/5ML suspension Take 30 mLs by mouth every 6 (six) hours as needed for indigestion or heartburn. 355 mL 0  . Calcium Carbonate (CALTRATE 600) 1500 MG TABS Take 600 mg of elemental calcium by mouth daily.     . Carboxymethylcellulose Sodium (EYE DROPS OP) Place 1 drop into both eyes daily as needed (dry eyes).     . cholecalciferol (VITAMIN D) 1000 UNITS tablet Take 1,000 Units by mouth daily.     Marland Kitchen dexamethasone (DECADRON) 4 MG tablet Take 2 tabs at the night before and 2 tabs the morning of chemotherapy, every 3  weeks, by mouth 20 tablet 0  . ferrous gluconate (FERGON) 324 MG tablet Take 1 tablet (324 mg total) by mouth 3 (  three) times daily with meals. 90 tablet 3  . fluticasone (FLONASE) 50 MCG/ACT nasal spray Use 2 sprays in each nostril daily (Patient taking differently: Place 2 sprays into both nostrils daily. ) 48 g 2  . hydrocortisone (ANUSOL-HC) 25 MG suppository Place 1 suppository (25 mg total) rectally 2 (two) times daily as needed for hemorrhoids or anal itching. 12 suppository 1  . ibuprofen (ADVIL,MOTRIN) 200 MG tablet Take 600 mg by mouth every 6 (six) hours as needed for moderate pain.     Marland Kitchen lactose free nutrition (BOOST PLUS) LIQD Take 237 mLs by mouth daily. (Patient taking differently: Take 237 mLs by mouth 2 (two) times daily between meals. ) 10 Can 0  . levothyroxine (SYNTHROID, LEVOTHROID) 75 MCG tablet Take 1 tablet (75 mcg total) by mouth daily. (Patient taking differently: Take 75 mcg by mouth daily before breakfast. ) 90 tablet 3  . lidocaine-prilocaine (EMLA) cream Apply 1 application topically as needed. 30 g 6  . Magnesium 400 MG TABS Take 250 mg by mouth daily.    . Menthol-Methyl Salicylate (MUSCLE RUB) 10-15 % CREA Apply 1 application topically as needed for muscle pain.    . metoprolol succinate (TOPROL-XL) 50 MG 24 hr tablet Take 1 tablet (50 mg total) by mouth daily. Take with or immediately following a meal. 90 tablet 3  . Multiple Vitamins-Minerals (MULTIVITAMIN WITH MINERALS) tablet Take 1 tablet by mouth daily.      . Omega-3 Fatty Acids (FISH OIL) 1000 MG CAPS Take 2,000 mg by mouth 2 (two) times daily.     Marland Kitchen omeprazole (PRILOSEC) 40 MG capsule Take 1 capsule (40 mg total) by mouth daily. 90 capsule 3  . ondansetron (ZOFRAN) 8 MG tablet Take 1 tablet (8 mg total) by mouth every 8 (eight) hours as needed for nausea. 90 tablet 3  . oxyCODONE (OXY IR/ROXICODONE) 5 MG immediate release tablet Take 1 tablet (5 mg total) by mouth every 6 (six) hours as needed for severe pain.  30 tablet 0  . polyethylene glycol (MIRALAX / GLYCOLAX) packet Take 17 g by mouth daily. (Patient not taking: Reported on 06/08/2018) 14 each 0  . prochlorperazine (COMPAZINE) 10 MG tablet Take 1 tablet (10 mg total) by mouth every 6 (six) hours as needed for nausea or vomiting. (Patient not taking: Reported on 05/02/2018) 60 tablet 11  . rivaroxaban (XARELTO) 20 MG TABS tablet Take 1 tablet (20 mg total) by mouth daily with supper. 14 tablet 0  . senna (SENOKOT) 8.6 MG TABS tablet Take 1 tablet (8.6 mg total) by mouth at bedtime as needed for mild constipation. 120 each 0  . senna-docusate (SENOKOT-S) 8.6-50 MG tablet Take 2 tablets by mouth at bedtime. For AFTER surgery 30 tablet 0  . simvastatin (ZOCOR) 20 MG tablet Take 1 tablet (20 mg total) by mouth at bedtime. 90 tablet 3  . vitamin C (ASCORBIC ACID) 500 MG tablet Take 1,000 mg by mouth daily.     No current facility-administered medications for this visit.     PHYSICAL EXAMINATION: ECOG PERFORMANCE STATUS: 1 - Symptomatic but completely ambulatory  Vitals:   07/03/18 1120  BP: (!) 117/56  Pulse: 60  Resp: 18  Temp: 98.9 F (37.2 C)  SpO2: 99%   Filed Weights   07/03/18 1120  Weight: 133 lb 12.8 oz (60.7 kg)    GENERAL:alert, no distress and comfortable Musculoskeletal:no cyanosis of digits and no clubbing  NEURO: alert & oriented x 3 with fluent speech, no focal  motor/sensory deficits  LABORATORY DATA:  I have reviewed the data as listed    Component Value Date/Time   NA 140 07/02/2018 0901   K 4.0 07/02/2018 0901   CL 108 07/02/2018 0901   CO2 25 07/02/2018 0901   GLUCOSE 86 07/02/2018 0901   BUN 10 07/02/2018 0901   CREATININE 0.74 07/02/2018 0901   CREATININE 0.76 11/17/2015 1005   CALCIUM 9.1 07/02/2018 0901   PROT 6.8 07/02/2018 0901   ALBUMIN 3.6 07/02/2018 0901   AST 11 (L) 07/02/2018 0901   ALT 10 07/02/2018 0901   ALKPHOS 81 07/02/2018 0901   BILITOT 0.4 07/02/2018 0901   GFRNONAA >60 07/02/2018 0901    GFRAA >60 07/02/2018 0901    No results found for: SPEP, UPEP  Lab Results  Component Value Date   WBC 5.6 07/02/2018   NEUTROABS 2.8 07/02/2018   HGB 11.6 (L) 07/02/2018   HCT 37.0 07/02/2018   MCV 96.6 07/02/2018   PLT 159 07/02/2018      Chemistry      Component Value Date/Time   NA 140 07/02/2018 0901   K 4.0 07/02/2018 0901   CL 108 07/02/2018 0901   CO2 25 07/02/2018 0901   BUN 10 07/02/2018 0901   CREATININE 0.74 07/02/2018 0901   CREATININE 0.76 11/17/2015 1005      Component Value Date/Time   CALCIUM 9.1 07/02/2018 0901   ALKPHOS 81 07/02/2018 0901   AST 11 (L) 07/02/2018 0901   ALT 10 07/02/2018 0901   BILITOT 0.4 07/02/2018 0901       RADIOGRAPHIC STUDIES: I have reviewed multiple imaging studies with the patient I have personally reviewed the radiological images as listed and agreed with the findings in the report. Ct Abdomen Pelvis W Contrast  Result Date: 07/02/2018 CLINICAL DATA:  Right ovarian epithelial cancer status post bilateral salpingo-oophorectomy, omentectomy and debulking 03/27/2018 and adjuvant chemotherapy. Restaging. EXAM: CT ABDOMEN AND PELVIS WITH CONTRAST TECHNIQUE: Multidetector CT imaging of the abdomen and pelvis was performed using the standard protocol following bolus administration of intravenous contrast. CONTRAST:  172m OMNIPAQUE IOHEXOL 300 MG/ML  SOLN COMPARISON:  03/15/2018 CT abdomen/pelvis. FINDINGS: Lower chest: No significant pulmonary nodules or acute consolidative airspace disease. Mild right pericardiophrenic adenopathy measuring up to 1.1 cm (series 2/image 10), mildly increased from 0.8 cm. Tip of superior approach central venous catheter is seen at the cavoatrial junction. Hepatobiliary: Normal liver size. Simple 1.4 cm left liver dome cyst. Additional subcentimeter hypodense lateral segment left liver lobe lesions are too small to characterize and are stable. No new liver lesions. Normal gallbladder with no radiopaque  cholelithiasis. No biliary ductal dilatation. Pancreas: Normal, with no mass or duct dilation. Spleen: Normal size. No mass. Adrenals/Urinary Tract: Normal adrenals. Mild fullness of the central renal collecting systems without overt hydronephrosis, improved from prior. No renal masses. Normal bladder. Stomach/Bowel: Normal non-distended stomach. Normal caliber small bowel with no small bowel wall thickening. Appendectomy. Oral contrast transits to left colon. Minimal sigmoid diverticulosis, no large bowel wall thickening or acute pericolonic fat stranding. Vascular/Lymphatic: Atherosclerotic nonaneurysmal abdominal aorta. Patent portal, splenic, hepatic and renal veins. No pathologically enlarged lymph nodes in the abdomen or pelvis. Reproductive: Status post hysterectomy, with no abnormal findings at the vaginal cuff. No adnexal mass. Other: No pneumoperitoneum, ascites or focal fluid collection. There is a 0.7 cm left peritoneal soft tissue nodule adjacent to splenic flexure (series 2/image 32). No additional peritoneal nodules. Left pelvic sidewall smooth peritoneal thickening (series 2/image 60). Musculoskeletal:  No aggressive appearing focal osseous lesions. Mild lumbar spondylosis. IMPRESSION: 1. Mild right pericardiophrenic adenopathy is mildly increased, suspicious for metastatic nodes. No abdominopelvic adenopathy. 2. Small left peritoneal soft tissue nodule adjacent to the splenic flexure and smooth left pelvic sidewall peritoneal thickening, cannot exclude residual/recurrent disease. Attention on follow-up CT advised. 3. Bilateral renal collecting system dilatation has improved. 4.  Aortic Atherosclerosis (ICD10-I70.0). Electronically Signed   By: Ilona Sorrel M.D.   On: 07/02/2018 13:43    All questions were answered. The patient knows to call the clinic with any problems, questions or concerns. No barriers to learning was detected.  I spent 30 minutes counseling the patient face to face. The total  time spent in the appointment was 40 minutes and more than 50% was on counseling and review of test results  Heath Lark, MD 07/03/2018 1:16 PM

## 2018-07-04 ENCOUNTER — Telehealth: Payer: Self-pay | Admitting: Hematology and Oncology

## 2018-07-04 NOTE — Telephone Encounter (Signed)
I spoke with the patient over the telephone and reviewed the plan of care with her again She has not have a chance to talk to her family I addressed all her questions She requested another phone call to follow-up tomorrow and I will call her then

## 2018-07-05 ENCOUNTER — Telehealth: Payer: Self-pay

## 2018-07-05 ENCOUNTER — Telehealth: Payer: Self-pay | Admitting: Hematology and Oncology

## 2018-07-05 ENCOUNTER — Other Ambulatory Visit: Payer: Self-pay | Admitting: Hematology and Oncology

## 2018-07-05 DIAGNOSIS — Z5111 Encounter for antineoplastic chemotherapy: Secondary | ICD-10-CM

## 2018-07-05 DIAGNOSIS — C561 Malignant neoplasm of right ovary: Secondary | ICD-10-CM

## 2018-07-05 NOTE — Progress Notes (Signed)
DISCONTINUE ON PATHWAY REGIMEN - Ovarian     A cycle is every 21 days:     Paclitaxel      Carboplatin   **Always confirm dose/schedule in your pharmacy ordering system**  REASON: Disease Progression PRIOR TREATMENT: [id number]: Carboplatin AUC=6 + Paclitaxel 175 mg/m2 q21 Days x 2-4 Cycles TREATMENT RESPONSE: Progressive Disease (PD)  START ON PATHWAY REGIMEN - Ovarian     A cycle is every 28 days:     Liposomal doxorubicin      Bevacizumab-xxxx   **Always confirm dose/schedule in your pharmacy ordering system**  Patient Characteristics: Recurrent or Progressive Disease, Second Line Therapy, Platinum Resistant or < 6 Months Since Last Therapy Therapeutic Status: Recurrent or Progressive Disease BRCA Mutation Status: Absent Line of Therapy: Second Line  Intent of Therapy: Non-Curative / Palliative Intent, Discussed with Patient 

## 2018-07-05 NOTE — Telephone Encounter (Signed)
I reviewed plan of care with the patient over the telephone She is interested to pursue further chemotherapy with doxorubicin and bevacizumab I will order pretreatment echocardiogram I will see her again on day 1 of treatment to review final plan of care and discussed results of echocardiogram

## 2018-07-05 NOTE — Telephone Encounter (Signed)
-----   Message from Heath Lark, MD sent at 07/05/2018  8:26 AM EDT ----- Regarding: echo I am planning to start her on bevacizumab and doxorubicin on July 6Please call to schedule echocardiogram sometime next week

## 2018-07-05 NOTE — Telephone Encounter (Signed)
Scheduled appt per 6/25 sch message - unable to reach pt . Left message for patient with appt date and time .

## 2018-07-05 NOTE — Telephone Encounter (Signed)
Called and given appt for Echo on 7/1 at 1000 am, instructed to arrive at 0945 for appt at [facility] main entrance. She verbalized understanding.

## 2018-07-09 ENCOUNTER — Other Ambulatory Visit: Payer: Self-pay | Admitting: Hematology and Oncology

## 2018-07-11 ENCOUNTER — Ambulatory Visit (HOSPITAL_COMMUNITY)
Admission: RE | Admit: 2018-07-11 | Discharge: 2018-07-11 | Disposition: A | Discharge status: Home / Self Care | Payer: PPO | Source: Ambulatory Visit | Attending: Hematology and Oncology | Admitting: Hematology and Oncology

## 2018-07-11 ENCOUNTER — Other Ambulatory Visit: Payer: Self-pay

## 2018-07-11 DIAGNOSIS — I10 Essential (primary) hypertension: Secondary | ICD-10-CM | POA: Diagnosis not present

## 2018-07-11 DIAGNOSIS — Z5111 Encounter for antineoplastic chemotherapy: Secondary | ICD-10-CM | POA: Diagnosis not present

## 2018-07-11 DIAGNOSIS — I080 Rheumatic disorders of both mitral and aortic valves: Secondary | ICD-10-CM | POA: Insufficient documentation

## 2018-07-11 DIAGNOSIS — I4891 Unspecified atrial fibrillation: Secondary | ICD-10-CM | POA: Insufficient documentation

## 2018-07-11 DIAGNOSIS — E785 Hyperlipidemia, unspecified: Secondary | ICD-10-CM | POA: Diagnosis not present

## 2018-07-11 DIAGNOSIS — C561 Malignant neoplasm of right ovary: Secondary | ICD-10-CM | POA: Diagnosis not present

## 2018-07-11 DIAGNOSIS — G4730 Sleep apnea, unspecified: Secondary | ICD-10-CM | POA: Insufficient documentation

## 2018-07-11 DIAGNOSIS — K219 Gastro-esophageal reflux disease without esophagitis: Secondary | ICD-10-CM | POA: Insufficient documentation

## 2018-07-11 NOTE — Progress Notes (Signed)
  Echocardiogram 2D Echocardiogram has been performed.  Haley [name] M 07/11/2018, 10:47 AM

## 2018-07-16 ENCOUNTER — Inpatient Hospital Stay: Payer: PPO | Attending: Obstetrics

## 2018-07-16 ENCOUNTER — Inpatient Hospital Stay: Payer: PPO

## 2018-07-16 ENCOUNTER — Inpatient Hospital Stay (HOSPITAL_BASED_OUTPATIENT_CLINIC_OR_DEPARTMENT_OTHER): Payer: PPO | Admitting: Hematology and Oncology

## 2018-07-16 ENCOUNTER — Other Ambulatory Visit: Payer: Self-pay

## 2018-07-16 VITALS — BP 148/71 | HR 57 | Temp 98.6°F | Resp 18 | Ht 62.0 in | Wt 133.2 lb

## 2018-07-16 VITALS — BP 119/65 | HR 63

## 2018-07-16 DIAGNOSIS — Z79899 Other long term (current) drug therapy: Secondary | ICD-10-CM

## 2018-07-16 DIAGNOSIS — R300 Dysuria: Secondary | ICD-10-CM | POA: Insufficient documentation

## 2018-07-16 DIAGNOSIS — T451X5D Adverse effect of antineoplastic and immunosuppressive drugs, subsequent encounter: Secondary | ICD-10-CM | POA: Insufficient documentation

## 2018-07-16 DIAGNOSIS — Z7951 Long term (current) use of inhaled steroids: Secondary | ICD-10-CM

## 2018-07-16 DIAGNOSIS — G620 Drug-induced polyneuropathy: Secondary | ICD-10-CM | POA: Insufficient documentation

## 2018-07-16 DIAGNOSIS — D61818 Other pancytopenia: Secondary | ICD-10-CM | POA: Diagnosis not present

## 2018-07-16 DIAGNOSIS — C561 Malignant neoplasm of right ovary: Secondary | ICD-10-CM

## 2018-07-16 DIAGNOSIS — M79606 Pain in leg, unspecified: Secondary | ICD-10-CM

## 2018-07-16 DIAGNOSIS — M549 Dorsalgia, unspecified: Secondary | ICD-10-CM | POA: Insufficient documentation

## 2018-07-16 DIAGNOSIS — Z7189 Other specified counseling: Secondary | ICD-10-CM

## 2018-07-16 DIAGNOSIS — C786 Secondary malignant neoplasm of retroperitoneum and peritoneum: Secondary | ICD-10-CM

## 2018-07-16 DIAGNOSIS — Z7901 Long term (current) use of anticoagulants: Secondary | ICD-10-CM

## 2018-07-16 DIAGNOSIS — C569 Malignant neoplasm of unspecified ovary: Secondary | ICD-10-CM

## 2018-07-16 DIAGNOSIS — I10 Essential (primary) hypertension: Secondary | ICD-10-CM | POA: Insufficient documentation

## 2018-07-16 LAB — CBC WITH DIFFERENTIAL (CANCER CENTER ONLY)
Abs Immature Granulocytes: 0.06 10*3/uL (ref 0.00–0.07)
Basophils Absolute: 0.1 10*3/uL (ref 0.0–0.1)
Basophils Relative: 1 %
Eosinophils Absolute: 0.4 10*3/uL (ref 0.0–0.5)
Eosinophils Relative: 5 %
HCT: 36.9 % (ref 36.0–46.0)
Hemoglobin: 12 g/dL (ref 12.0–15.0)
Immature Granulocytes: 1 %
Lymphocytes Relative: 25 %
Lymphs Abs: 1.9 10*3/uL (ref 0.7–4.0)
MCH: 30.8 pg (ref 26.0–34.0)
MCHC: 32.5 g/dL (ref 30.0–36.0)
MCV: 94.6 fL (ref 80.0–100.0)
Monocytes Absolute: 0.7 10*3/uL (ref 0.1–1.0)
Monocytes Relative: 9 %
Neutro Abs: 4.5 10*3/uL (ref 1.7–7.7)
Neutrophils Relative %: 59 %
Platelet Count: 163 10*3/uL (ref 150–400)
RBC: 3.9 MIL/uL (ref 3.87–5.11)
RDW: 15.9 % — ABNORMAL HIGH (ref 11.5–15.5)
WBC Count: 7.6 10*3/uL (ref 4.0–10.5)
nRBC: 0 % (ref 0.0–0.2)

## 2018-07-16 LAB — CMP (CANCER CENTER ONLY)
ALT: 10 U/L (ref 0–44)
AST: 15 U/L (ref 15–41)
Albumin: 3.6 g/dL (ref 3.5–5.0)
Alkaline Phosphatase: 77 U/L (ref 38–126)
Anion gap: 8 (ref 5–15)
BUN: 10 mg/dL (ref 8–23)
CO2: 26 mmol/L (ref 22–32)
Calcium: 8.9 mg/dL (ref 8.9–10.3)
Chloride: 107 mmol/L (ref 98–111)
Creatinine: 0.75 mg/dL (ref 0.44–1.00)
GFR, Est AFR Am: >60 mL/min (ref >60)
GFR, Estimated: >60 mL/min (ref >60)
Glucose, Bld: 81 mg/dL (ref 70–99)
Potassium: 4 mmol/L (ref 3.5–5.1)
Sodium: 141 mmol/L (ref 135–145)
Total Bilirubin: 0.4 mg/dL (ref 0.3–1.2)
Total Protein: 7 g/dL (ref 6.5–8.1)

## 2018-07-16 LAB — URINALYSIS, COMPLETE (UACMP) WITH MICROSCOPIC
Bilirubin Urine: NEGATIVE
Glucose, UA: NEGATIVE mg/dL
Hgb urine dipstick: NEGATIVE
Ketones, ur: NEGATIVE mg/dL
Leukocytes,Ua: NEGATIVE
Nitrite: NEGATIVE
Protein, ur: NEGATIVE mg/dL
Specific Gravity, Urine: 1.004 — ABNORMAL LOW (ref 1.005–1.030)
pH: 6 (ref 5.0–8.0)

## 2018-07-16 LAB — TOTAL PROTEIN, URINE DIPSTICK: Protein, ur: NEGATIVE mg/dL

## 2018-07-16 MED ORDER — PROCHLORPERAZINE MALEATE 10 MG PO TABS: 10.0000 mg | ORAL_TABLET | Freq: Four times a day (QID) | ORAL | 1 refills | Status: DC | PRN

## 2018-07-16 MED ORDER — SODIUM CHLORIDE 0.9 % IV SOLN
Freq: Once | INTRAVENOUS | Status: AC
  Administered 2018-07-16: 15:00:00 via INTRAVENOUS
  Filled 2018-07-16: qty 250

## 2018-07-16 MED ORDER — SODIUM CHLORIDE 0.9% FLUSH
10.0000 mL | INTRAVENOUS | Status: DC | PRN
  Administered 2018-07-16: 10 mL
  Filled 2018-07-16: qty 10

## 2018-07-16 MED ORDER — HEPARIN SOD (PORK) LOCK FLUSH 100 UNIT/ML IV SOLN
500.0000 [IU] | Freq: Once | INTRAVENOUS | Status: AC | PRN
  Administered 2018-07-16: 500 [IU]
  Filled 2018-07-16: qty 5

## 2018-07-16 MED ORDER — LIDOCAINE-PRILOCAINE 2.5-2.5 % EX CREA: TOPICAL_CREAM | CUTANEOUS | 3 refills | Status: DC

## 2018-07-16 MED ORDER — SODIUM CHLORIDE 0.9 % IV SOLN: 10.0000 mg | Freq: Once | INTRAVENOUS | Status: DC

## 2018-07-16 MED ORDER — DEXTROSE 5 % IV SOLN
Freq: Once | INTRAVENOUS | Status: AC
  Administered 2018-07-16: 15:00:00 via INTRAVENOUS
  Filled 2018-07-16: qty 250

## 2018-07-16 MED ORDER — ONDANSETRON HCL 8 MG PO TABS: 8.0000 mg | ORAL_TABLET | Freq: Three times a day (TID) | ORAL | 1 refills | Status: DC | PRN

## 2018-07-16 MED ORDER — SODIUM CHLORIDE 0.9 % IV SOLN
10.0000 mg/kg | Freq: Once | INTRAVENOUS | Status: AC
  Administered 2018-07-16: 16:00:00 600 mg via INTRAVENOUS
  Filled 2018-07-16: qty 16

## 2018-07-16 MED ORDER — DOXORUBICIN HCL LIPOSOMAL CHEMO INJECTION 2 MG/ML
37.0000 mg/m2 | Freq: Once | INTRAVENOUS | Status: AC
  Administered 2018-07-16: 17:00:00 60 mg via INTRAVENOUS
  Filled 2018-07-16: qty 30

## 2018-07-16 MED ORDER — OXYCODONE HCL 5 MG PO TABS: 5.0000 mg | ORAL_TABLET | Freq: Four times a day (QID) | ORAL | 0 refills | Status: DC | PRN

## 2018-07-16 MED ORDER — SODIUM CHLORIDE 0.9% FLUSH
10.0000 mL | Freq: Once | INTRAVENOUS | Status: AC
  Administered 2018-07-16: 10 mL
  Filled 2018-07-16: qty 10

## 2018-07-16 MED ORDER — DEXAMETHASONE SODIUM PHOSPHATE 10 MG/ML IJ SOLN
10.0000 mg | Freq: Once | INTRAMUSCULAR | Status: AC
  Administered 2018-07-16: 10 mg via INTRAVENOUS

## 2018-07-16 MED ORDER — DEXAMETHASONE SODIUM PHOSPHATE 10 MG/ML IJ SOLN
INTRAMUSCULAR | Status: AC
  Filled 2018-07-16: qty 1

## 2018-07-16 NOTE — Patient Instructions (Signed)
[facility] Discharge Instructions for Patients Receiving Chemotherapy  Today you received the following chemotherapy agents :  Avastin,  Doxil.  To help prevent nausea and vomiting after your treatment, we encourage you to take your nausea medication as prescribed.  Do  Drink lots of fluids  As  Tolerated.  Do Not  Eat  Greasy  Nor  Spicy  Foods.   If you develop nausea and vomiting that is not controlled by your nausea medication, call the clinic.   BELOW ARE SYMPTOMS THAT SHOULD BE REPORTED IMMEDIATELY:  *FEVER GREATER THAN 100.5 F  *CHILLS WITH OR WITHOUT FEVER  NAUSEA AND VOMITING THAT IS NOT CONTROLLED WITH YOUR NAUSEA MEDICATION  *UNUSUAL SHORTNESS OF BREATH  *UNUSUAL BRUISING OR BLEEDING  TENDERNESS IN MOUTH AND THROAT WITH OR WITHOUT PRESENCE OF ULCERS  *URINARY PROBLEMS  *BOWEL PROBLEMS  UNUSUAL RASH Items with * indicate a potential emergency and should be followed up as soon as possible.  Feel free to call the clinic should you have any questions or concerns. The clinic phone number is [phone].  Please show the [place] at check-in to the Emergency Department and triage nurse.

## 2018-07-17 ENCOUNTER — Other Ambulatory Visit: Payer: Self-pay

## 2018-07-17 ENCOUNTER — Encounter: Payer: Self-pay | Admitting: Hematology and Oncology

## 2018-07-17 ENCOUNTER — Telehealth: Payer: Self-pay

## 2018-07-17 MED ORDER — CIPROFLOXACIN HCL 250 MG PO TABS: 250.0000 mg | ORAL_TABLET | Freq: Two times a day (BID) | ORAL | 0 refills | Status: DC

## 2018-07-17 NOTE — Telephone Encounter (Signed)
Spoke with pt and gave below msg. Prescription sent to her preferred pharmacy. Pt verbalizes understanding.

## 2018-07-17 NOTE — Progress Notes (Signed)
[facility] OFFICE PROGRESS NOTE  Patient Care Team: Martinique, Tiann G, MD as PCP - General (Family Medicine) Troy Sine, MD as PCP - Cardiology (Cardiology) Awanda Mink Craige Cotta, RN as Oncology Nurse Navigator (Oncology)  ASSESSMENT & PLAN:  Right ovarian epithelial cancer [facility]) We discussed the role of chemotherapy. The intent is palliative.  J Clin Oncol. 2014 May 1;32(13):1302-8. doi: 10.1200/JCO.[id number]. Epub 2014 Mar 17. Bevacizumab combined with chemotherapy for platinum-resistant recurrent ovarian cancer: The AURELIA open-label randomized phase III trial. Pujade-Lauraine E1, Hilpert F, Weber B, Reuss A, Poveda A, Rayfield Citizen, Sorio R, Vergote I, Britton, Bamias A, [place] D, Wimberger P, Oaknin A, Mirza MR, [place], Bollag D, Ray-Coquard I. Chief Strategy Officer information 1 [employer], Group d'Investigateurs Nationaux pour [employer] (GINECO) and [employer], Assistance Publique-[place] de [place], [place]; Batrice Weber, GINECO and [employer], [place]; Marin Comment, GINECO and [place], [place]; [place], [place], [place], [place]; [place], Arbeitsgemeinschaft Gynkologische Onkologie (AGO) and [employer] fr Gynkologie und [place], [place]; Kerin Salen, AGO and [employer] for [employer], [place]; Lauris Poag, AGO and [place] of [place], [place], [place]; Scheryl Marten, Grupo Esp[employer]ol de Pharmacist, community de [place]) and [place], [place]; Nelida Gores, GEICO and [facility], [place], [place]; Noe Gens, [place] Society of Gynaecological Oncology ([place]) and [facility], [place], [place]); Landry Corporal, Tax inspector [place] Trials in Environmental manager and [place] a Field seismologist, [place], [place]; [place], [place] and [place], [place], [place]; Letta Pate, [place] [facility] [facility] and [facility], [place], the [place]; Arboriculturist, [place], [place], [place]; Adah Salvage, [place] and [place], [place], [place]; [place], [place] and [place], [place], [place]; and [place], [place]. [place], [place].  Erratum in  J Clin Oncol. 2014 Dec 10;32(35):4025.  Abstract PURPOSE:  In platinum-resistant ovarian cancer (OC), single-agent chemotherapy is standard. Bevacizumab is active alone and in combination. AURELIA is the first randomized phase III trial to our knowledge combining bevacizumab with chemotherapy in platinum-resistant OC. PATIENTS AND METHODS:  Eligible patients had measurable/assessable OC that had progressed < 6 months after completing platinum-based therapy. Patients with refractory disease, history of bowel obstruction, or > two prior anticancer regimens were ineligible. After investigators selected chemotherapy (pegylated liposomal doxorubicin, weekly paclitaxel, or topotecan), patients were randomly assigned to single-agent chemotherapy alone or with bevacizumab (10 mg/kg every 2 weeks or 15 mg/kg every 3 weeks) until progression, unacceptable toxicity, or consent withdrawal. Crossover to single-agent bevacizumab was permitted after progression with chemotherapy alone. The primary end point was progression-free survival (PFS) by RECIST. Secondary end points included objective response rate (ORR), overall survival (OS), safety, and patient-reported outcomes. RESULTS:  The PFS hazard ratio (HR) after PFS events in 301 of 361 patients was 0.48 (95% CI, 0.38 to 0.60; unstratified log-rank P < .001). Median PFS was 3.4 months with chemotherapy alone versus 6.7 months with bevacizumab-containing therapy.  RECIST ORR was 11.8% versus 27.3%, respectively (P = .001). The OS HR was 0.85 (95% CI, 0.66 to 1.08; P < .174; median OS, 13.3 v 16.6 months, respectively). Grade ? 2 hypertension and proteinuria were more common with bevacizumab. GI perforation occurred in 2.2% of bevacizumab-treated patients. CONCLUSION:  Adding bevacizumab to chemotherapy statistically significantly improved PFS and ORR; the OS trend was not significant. No new safety signals  were observed.  We discussed some of the risks, benefits and side-effects of  Doxil and Avastin  Some of the short term side-effects included, though not limited to, risk of fatigue, weight loss, tumor lysis syndrome, risk of allergic reactions, pancytopenia, life-threatening infections, need for transfusions of blood products, nausea, vomiting, change in bowel habits, hair loss, risk of congestive heart failure, admission to hospital for various reasons, and risks of death.   Long term side-effects are also discussed including permanent damage to nerve function, chronic fatigue, and rare secondary malignancy including bone marrow disorders.   The patient is aware that the response rates discussed earlier is not guaranteed.    After a long discussion, patient made an informed decision to proceed with the prescribed plan of care.   Patient education material was dispensed  I reviewed results of echocardiogram which is normal She is aware that she would need repeat echocardiogram every 3 months to follow-up on cardiac function I have advised the patient to check her blood pressure twice a day and to report to me if systolic blood pressure is greater than 371 or diastolic blood pressure greater than 95  Dysuria She has been complaining of smelly urine and worrisome for possible infection I have ordered urinalysis and urine culture and will call the patient with test results  Lower extremity pain She has intermittent leg pain and back pain secondary to  degenerative arthritis I recommend judicious use of pain medicine  Goals of care, counseling/discussion We have further discussions about goals of care The patient has platinum refractory disease We have discussed prognosis in the past and the goals of care is strictly palliative in nature I try my best to explain to the patient to the best of my ability I have indicated my willingness to call the family but the patient insists she will talk to the family herself   Orders Placed This Encounter  Procedures  . Urine Culture    Standing Status:   Future    Number of Occurrences:   1    Standing Expiration Date:   08/20/2019  . Urinalysis, Complete w Microscopic    Standing Status:   Future    Number of Occurrences:   1    Standing Expiration Date:   07/16/2019    INTERVAL HISTORY: Please see below for problem oriented charting. She returns to begin palliative chemotherapy Since last time I saw her, she has been complaining of smelly urine She denies hematuria or discomfort She denies pain or abdominal bloating The patient stated she would like to record her conversation because of family was not involved in the conversation I explained to the patient that I am willing to call her husband and family but the patient stated that she will inform them herself  SUMMARY OF ONCOLOGIC HISTORY: Oncology History Overview Note  Neg for genetics blood work and HRD   Right ovarian epithelial cancer ([place])  01/06/2018 Imaging   Ct abdomen and pelvis 1. Complex cystic mass at the right adnexa, measuring 5.9 x 4.0 cm, with nodular components, concerning for primary ovarian malignancy. 2. Diffuse nodularity along the omentum at the left side of the abdomen, extending into the mesentery at the left mid abdomen, concerning for peritoneal carcinomatosis. 3. Wall thickening at the distal ileum adjacent to the ovarian mass; bowel loops appear somewhat adherent to the ovarian mass. Bowel infiltration with  tumor cannot be excluded. No evidence of bowel obstruction at this time. 4. Small volume ascites within  the abdomen and pelvis.  Aortic Atherosclerosis (ICD10-I70.0).   01/08/2018 Tumor Marker   Patient's tumor was tested for the following markers: CA-125. Results of the tumor marker test revealed 3004   01/15/2018 Imaging   Chest CT:  1. No active cardiopulmonary disease. 2. Aortic atherosclerosis without aneurysm or dissection. 3. No large central pulmonary embolus.  CT AP:  1. Dilated fluid-filled loops of small bowel are redemonstrated slightly more extensive than on prior exam with transition point likely in the right adnexa adjacent to a complex cystic mass concerning for ovarian neoplasm given septations and soft tissue nodularity. This raises concern for early or partial SBO. This soft tissue mass measures 4.9 x 4 x 4.7 cm and has not changed since prior recent comparison. Additional short segmental area of luminal narrowing is noted in the right lower quadrant involving small bowel for which stigmata of peritoneal carcinomatosis or small-bowel metastatic implants might account for this. 2. Redemonstration of small volume of ascites predominantly in the upper abdomen surrounding the liver and spleen. 3. Redemonstration of thick bandlike omental thickening concerning for peritoneal carcinomatosis.    01/15/2018 Procedure   Successful ultrasound-guided diagnostic and therapeutic paracentesis yielding 1.5 liters of peritoneal fluid.    01/15/2018 Pathology Results   PERITONEAL/ASCITIC FLUID (SPECIMEN 1 OF 1 COLLECTED 01/18/18): MALIGNANT CELLS CONSISTENT WITH METASTATIC ADENOCARCINOMA. SEE COMMENT. COMMENT: THE MALIGNANT CELLS ARE POSITIVE FOR MOC-31, CYTOKERATIN 7, ESTROGEN RECEPTOR, PAX-8, AND WT-1. THEY ARE NEGATIVE FOR CALRETININ, CYTOKERATIN 5/6, AND CYTOKERATIN 20. THE PROFILE IS CONSISTENT WITH A PRIMARY GYNECOLOGIC CARCINOMA. THERE IS LIKELY SUFFICIENT TUMOR PRESENT, IF  ADDITIONAL STUDIES ARE REQUESTED.   01/15/2018 - 02/02/2018 Hospital Admission   She was admitted to the hospital for SBO. She was treated with chemotherapy   01/18/2018 - 06/20/2018 Chemotherapy   The patient had carboplatin and taxol   02/08/2018 Cancer Staging   Staging form: Ovary, Fallopian Tube, and Primary Peritoneal Carcinoma, AJCC 8th Edition - Clinical: cT3, cN0, cM0 - Signed by Heath Lark, MD on 02/08/2018   02/08/2018 Tumor Marker   Patient's tumor was tested for the following markers: CA-125. Results of the tumor marker test revealed 445   03/02/2018 Tumor Marker   Patient's tumor was tested for the following markers: CA-125. Results of the tumor marker test revealed 174   03/15/2018 Imaging   6.2 cm complex cystic right ovarian mass, compatible with malignant ovarian neoplasm, mildly progressive.  Associated peritoneal disease/omental caking beneath the anterior abdominal wall, mildly improved. Prior abdominal ascites is improved/resolved.  4.7 cm cystic left ovarian mass, without overt malignant features, grossly unchanged.  Mild bilateral hydroureteronephrosis, secondary to extrinsic compression, new.  Prior small bowel obstruction has improved/resolved.    03/27/2018 Pathology Results   1. Omentum, resection for tumor - OMENTUM: - HIGH GRADE SEROUS CARCINOMA. 2. Ovary, left - LEFT OVARY: - HIGH GRADE SEROUS CARCINOMA. - LEFT FALLOPIAN TUBE: - HIGH GRADE SEROUS CARCINOMA. 3. Adnexa - ovary +/- tube, neoplastic, right - RIGHT OVARY: - HIGH GRADE SEROUS CARCINOMA, 5.5 CM. - RIGHT FALLOPIAN TUBE: - HIGH GRADE SEROUS CARCINOMA. 4. Peritoneum, biopsy, nodule - PERITONEUM: - NODULES OF HIGH GRADE SEROUS CARCINOMA. 5. Peritoneum, resection for tumor, rectosigmoid - RECTOSIGMOID PERITONEUM: - HIGH GRADE SEROUS CARCINOMA. Microscopic Comment 3. OVARY or FALLOPIAN TUBE or PRIMARY PERITONEUM: Procedure: Bilateral salpingo-oophorectomy, omentectomy and peritoneal  biopsies. Specimen Integrity: N/A. Tumor Site: Right ovary. Ovarian Surface Involvement (required only if applicable): Yes. Fallopian Tube Surface Involvement (required only if applicable): Yes. Tumor Size: 5.5 x  4.8 x 4.0 cm. Histologic Type: Serous carcinoma. Histologic Grade: High grade. Implants (required for advanced stage serous/seromucinous borderline tumors only): Omentum, left ovary and fallopian tube, rectosigmoid peritoneum and peritoneum. Other Tissue/ Organ Involvement: Left ovary and fallopian tube, omentum and rectosigmoid peritoneum. Largest Extrapelvic Peritoneal Focus (required only if applicable): 50.2 cm, omentum. Peritoneal/Ascitic Fluid: N/A. Treatment Effect (required only for high-grade serous carcinomas): Minimal. Regional Lymph Nodes: No lymph nodes submitted. Pathologic Stage Classification (pTNM, AJCC 8th Edition): pT3c, pNX. Representative Tumor Block: 3A, 3B, 3D, 3E and 30F. Comment(s): There is a 5.5 cm in greatest dimension partially cystic high grade serous carcinoma involving the right adnexal specimen and the tumor is staged as a primary right ovarian carcinoma. The carcinoma also involves the left ovary as well as the left fallopian tube, the omentum, peritoneum and the rectosigmoid peritoneal specimens.   03/27/2018 Surgery   Preoperative Diagnosis: ovarian cancer, stage IIIC, metastatic to omentum, peritoneum, serosa of intestines   Procedure(s) Performed: 1. Exploratory laparotomy with bilateral salpingo-oophorectomy, omentectomy radical tumor debulking for ovarian cancer .  Surgeon: Thereasa Solo, MD.   Specimens: Bilateral tubes / ovaries, omentum. Peritoneal nodules, rectosigmoid nodules.    Operative Findings: omental cake, miliary studding on diaphragm and bilateral paracolic gutters. Sigmoid colon densely adherent to left and right ovaries with tumor rind, ureters mildly dilated bilaterally due to retroperitoneal extension of the tumor towards  ureters causing compression.    This represented an optimal cytoreduction (R1) with gross residual disease on the diaphragm that had been ablated and a thin tumor plaque on the sigmoid colon that was ablated. No residual disease >1cm remaining   05/07/2018 Genetic Testing   Negative genetic testing on the [employer] TumorNextHRD+ CancerNext Panel. The CancerNext gene panel offered by [employer] includes sequencing and rearrangement analysis for the following 34 genes:   APC, ATM, BARD1, BMPR1A, BRCA1, BRCA2, BRIP1, CDH1, CDK4, CDKN2A, CHEK2, DICER1, EPCAM, GREM1, HOXB13, MLH1, MRE11A, MSH2, MSH6, MUTYH, NBN, NF1, PALB2, PMS2, POLD1, POLE, PTEN, RAD50, RAD51C, RAD51D, SMAD4, SMARCA4, STK11, and TP53. Somatic genes analyzed through TumorNext-HRD: ATM, BARD1, BRCA1, BRCA2, BRIP1, CHEK2, MRE11A, NBN, PALB2, RAD51C, RAD51D. The report date is 05/07/2018.   05/11/2018 Tumor Marker   Patient's tumor was tested for the following markers: CA-125. Results of the tumor marker test revealed 60.5   07/02/2018 Imaging   1. Mild right pericardiophrenic adenopathy is mildly increased, suspicious for metastatic nodes. No abdominopelvic adenopathy. 2. Small left peritoneal soft tissue nodule adjacent to the splenic flexure and smooth left pelvic sidewall peritoneal thickening, cannot exclude residual/recurrent disease. Attention on follow-up CT advised. 3. Bilateral renal collecting system dilatation has improved. 4.  Aortic Atherosclerosis (ICD10-I70.0).   07/02/2018 Tumor Marker   Patient's tumor was tested for the following markers: CA-125 Results of the tumor marker test revealed 42.7   07/11/2018 Echocardiogram   1. The left ventricle has normal systolic function with an ejection fraction of 60-65%. The cavity size was normal. Left ventricular diastolic parameters were normal.  2. Normal GLS -20.1.  3. The right ventricle has normal systolic function. The cavity was normal. There is no increase in right ventricular  wall thickness.  4. The mitral valve is degenerative. Moderate thickening of the mitral valve leaflet. Moderate calcification of the mitral valve leaflet.  5. The aortic valve is tricuspid. Moderate thickening of the aortic valve. Moderate calcification of the aortic valve. Aortic valve regurgitation is trivial by color flow Doppler.  6. The aortic root is normal in size and  structure.   07/16/2018 -  Chemotherapy   The patient had bevacizumab (AVASTIN) 600 mg in sodium chloride 0.9 % 100 mL chemo infusion, 10 mg/kg = 600 mg, Intravenous,  Once, 1 of 6 cycles Administration: 600 mg (07/16/2018) DOXOrubicin HCL LIPOSOMAL (DOXIL) 60 mg in dextrose 5 % 250 mL chemo infusion, 37 mg/m2 = 66 mg, Intravenous,  Once, 1 of 6 cycles Administration: 60 mg (07/16/2018)  for chemotherapy treatment.      REVIEW OF SYSTEMS:   Constitutional: Denies fevers, chills or abnormal weight loss Eyes: Denies blurriness of vision Ears, nose, mouth, throat, and face: Denies mucositis or sore throat Respiratory: Denies cough, dyspnea or wheezes Cardiovascular: Denies palpitation, chest discomfort or lower extremity swelling Gastrointestinal:  Denies nausea, heartburn or change in bowel habits Skin: Denies abnormal skin rashes Lymphatics: Denies new lymphadenopathy or easy bruising Neurological:Denies numbness, tingling or new weaknesses Behavioral/Psych: Mood is stable, no new changes  All other systems were reviewed with the patient and are negative.  I have reviewed the past medical history, past surgical history, social history and family history with the patient and they are unchanged from previous note.  ALLERGIES:  is allergic to tramadol hcl.  MEDICATIONS:  Current Outpatient Medications  Medication Sig Dispense Refill  . acetaminophen (TYLENOL) 500 MG tablet Take 2 tablets (1,000 mg total) by mouth every 6 (six) hours. (Patient taking differently: Take 1,000 mg by mouth every 6 (six) hours as needed. ) 30  tablet 0  . ALPRAZolam (XANAX) 0.5 MG tablet Take 0.5-1 tablets (0.25-0.5 mg total) by mouth daily as needed for anxiety. 30 tablet 2  . alum & mag hydroxide-simeth (MAALOX/MYLANTA) 200-200-20 MG/5ML suspension Take 30 mLs by mouth every 6 (six) hours as needed for indigestion or heartburn. 355 mL 0  . Calcium Carbonate (CALTRATE 600) 1500 MG TABS Take 600 mg of elemental calcium by mouth daily.     . Carboxymethylcellulose Sodium (EYE DROPS OP) Place 1 drop into both eyes daily as needed (dry eyes).     . cholecalciferol (VITAMIN D) 1000 UNITS tablet Take 1,000 Units by mouth daily.     . fluticasone (FLONASE) 50 MCG/ACT nasal spray Use 2 sprays in each nostril daily (Patient taking differently: Place 2 sprays into both nostrils daily. ) 48 g 2  . hydrocortisone (ANUSOL-HC) 25 MG suppository Place 1 suppository (25 mg total) rectally 2 (two) times daily as needed for hemorrhoids or anal itching. 12 suppository 1  . ibuprofen (ADVIL,MOTRIN) 200 MG tablet Take 600 mg by mouth every 6 (six) hours as needed for moderate pain.     Marland Kitchen lactose free nutrition (BOOST PLUS) LIQD Take 237 mLs by mouth daily. (Patient taking differently: Take 237 mLs by mouth 2 (two) times daily between meals. ) 10 Can 0  . levothyroxine (SYNTHROID, LEVOTHROID) 75 MCG tablet Take 1 tablet (75 mcg total) by mouth daily. (Patient taking differently: Take 75 mcg by mouth daily before breakfast. ) 90 tablet 3  . lidocaine-prilocaine (EMLA) cream Apply 1 application topically as needed. 30 g 6  . lidocaine-prilocaine (EMLA) cream Apply to affected area once 30 g 3  . Magnesium 400 MG TABS Take 250 mg by mouth daily.    . Menthol-Methyl Salicylate (MUSCLE RUB) 10-15 % CREA Apply 1 application topically as needed for muscle pain.    . metoprolol succinate (TOPROL-XL) 50 MG 24 hr tablet Take 1 tablet (50 mg total) by mouth daily. Take with or immediately following a meal. 90 tablet 3  .  Multiple Vitamins-Minerals (MULTIVITAMIN WITH  MINERALS) tablet Take 1 tablet by mouth daily.      . Omega-3 Fatty Acids (FISH OIL) 1000 MG CAPS Take 2,000 mg by mouth 2 (two) times daily.     Marland Kitchen omeprazole (PRILOSEC) 40 MG capsule Take 1 capsule (40 mg total) by mouth daily. 90 capsule 3  . ondansetron (ZOFRAN) 8 MG tablet Take 1 tablet (8 mg total) by mouth every 8 (eight) hours as needed for nausea. 90 tablet 3  . ondansetron (ZOFRAN) 8 MG tablet Take 1 tablet (8 mg total) by mouth every 8 (eight) hours as needed (Nausea or vomiting). 30 tablet 1  . oxyCODONE (OXY IR/ROXICODONE) 5 MG immediate release tablet Take 1 tablet (5 mg total) by mouth every 6 (six) hours as needed for severe pain. 30 tablet 0  . polyethylene glycol (MIRALAX / GLYCOLAX) packet Take 17 g by mouth daily. (Patient not taking: Reported on 06/08/2018) 14 each 0  . prochlorperazine (COMPAZINE) 10 MG tablet Take 1 tablet (10 mg total) by mouth every 6 (six) hours as needed for nausea or vomiting. (Patient not taking: Reported on 05/02/2018) 60 tablet 11  . prochlorperazine (COMPAZINE) 10 MG tablet Take 1 tablet (10 mg total) by mouth every 6 (six) hours as needed (Nausea or vomiting). 30 tablet 1  . rivaroxaban (XARELTO) 20 MG TABS tablet Take 1 tablet (20 mg total) by mouth daily with supper. 14 tablet 0  . senna (SENOKOT) 8.6 MG TABS tablet Take 1 tablet (8.6 mg total) by mouth at bedtime as needed for mild constipation. 120 each 0  . senna-docusate (SENOKOT-S) 8.6-50 MG tablet Take 2 tablets by mouth at bedtime. For AFTER surgery 30 tablet 0  . simvastatin (ZOCOR) 20 MG tablet Take 1 tablet (20 mg total) by mouth at bedtime. 90 tablet 3  . vitamin C (ASCORBIC ACID) 500 MG tablet Take 1,000 mg by mouth daily.     No current facility-administered medications for this visit.     PHYSICAL EXAMINATION: ECOG PERFORMANCE STATUS: 1 - Symptomatic but completely ambulatory  Vitals:   07/16/18 1407  BP: (!) 148/71  Pulse: (!) 57  Resp: 18  Temp: 98.6 F (37 C)  SpO2: 100%    Filed Weights   07/16/18 1407  Weight: 133 lb 3.2 oz (60.4 kg)    GENERAL:alert, no distress and comfortable ABDOMEN:abdomen soft, non-tender and normal bowel sounds Musculoskeletal:no cyanosis of digits and no clubbing  NEURO: alert & oriented x 3 with fluent speech, no focal motor/sensory deficits  LABORATORY DATA:  I have reviewed the data as listed    Component Value Date/Time   NA 141 07/16/2018 1314   K 4.0 07/16/2018 1314   CL 107 07/16/2018 1314   CO2 26 07/16/2018 1314   GLUCOSE 81 07/16/2018 1314   BUN 10 07/16/2018 1314   CREATININE 0.75 07/16/2018 1314   CREATININE 0.76 11/17/2015 1005   CALCIUM 8.9 07/16/2018 1314   PROT 7.0 07/16/2018 1314   ALBUMIN 3.6 07/16/2018 1314   AST 15 07/16/2018 1314   ALT 10 07/16/2018 1314   ALKPHOS 77 07/16/2018 1314   BILITOT 0.4 07/16/2018 1314   GFRNONAA >60 07/16/2018 1314   GFRAA >60 07/16/2018 1314    No results found for: SPEP, UPEP  Lab Results  Component Value Date   WBC 7.6 07/16/2018   NEUTROABS 4.5 07/16/2018   HGB 12.0 07/16/2018   HCT 36.9 07/16/2018   MCV 94.6 07/16/2018   PLT 163 07/16/2018  Chemistry      Component Value Date/Time   NA 141 07/16/2018 1314   K 4.0 07/16/2018 1314   CL 107 07/16/2018 1314   CO2 26 07/16/2018 1314   BUN 10 07/16/2018 1314   CREATININE 0.75 07/16/2018 1314   CREATININE 0.76 11/17/2015 1005      Component Value Date/Time   CALCIUM 8.9 07/16/2018 1314   ALKPHOS 77 07/16/2018 1314   AST 15 07/16/2018 1314   ALT 10 07/16/2018 1314   BILITOT 0.4 07/16/2018 1314       RADIOGRAPHIC STUDIES: I have personally reviewed the radiological images as listed and agreed with the findings in the report. Ct Abdomen Pelvis W Contrast  Result Date: 07/02/2018 CLINICAL DATA:  Right ovarian epithelial cancer status post bilateral salpingo-oophorectomy, omentectomy and debulking 03/27/2018 and adjuvant chemotherapy. Restaging. EXAM: CT ABDOMEN AND PELVIS WITH CONTRAST  TECHNIQUE: Multidetector CT imaging of the abdomen and pelvis was performed using the standard protocol following bolus administration of intravenous contrast. CONTRAST:  146m OMNIPAQUE IOHEXOL 300 MG/ML  SOLN COMPARISON:  03/15/2018 CT abdomen/pelvis. FINDINGS: Lower chest: No significant pulmonary nodules or acute consolidative airspace disease. Mild right pericardiophrenic adenopathy measuring up to 1.1 cm (series 2/image 10), mildly increased from 0.8 cm. Tip of superior approach central venous catheter is seen at the cavoatrial junction. Hepatobiliary: Normal liver size. Simple 1.4 cm left liver dome cyst. Additional subcentimeter hypodense lateral segment left liver lobe lesions are too small to characterize and are stable. No new liver lesions. Normal gallbladder with no radiopaque cholelithiasis. No biliary ductal dilatation. Pancreas: Normal, with no mass or duct dilation. Spleen: Normal size. No mass. Adrenals/Urinary Tract: Normal adrenals. Mild fullness of the central renal collecting systems without overt hydronephrosis, improved from prior. No renal masses. Normal bladder. Stomach/Bowel: Normal non-distended stomach. Normal caliber small bowel with no small bowel wall thickening. Appendectomy. Oral contrast transits to left colon. Minimal sigmoid diverticulosis, no large bowel wall thickening or acute pericolonic fat stranding. Vascular/Lymphatic: Atherosclerotic nonaneurysmal abdominal aorta. Patent portal, splenic, hepatic and renal veins. No pathologically enlarged lymph nodes in the abdomen or pelvis. Reproductive: Status post hysterectomy, with no abnormal findings at the vaginal cuff. No adnexal mass. Other: No pneumoperitoneum, ascites or focal fluid collection. There is a 0.7 cm left peritoneal soft tissue nodule adjacent to splenic flexure (series 2/image 32). No additional peritoneal nodules. Left pelvic sidewall smooth peritoneal thickening (series 2/image 60). Musculoskeletal: No  aggressive appearing focal osseous lesions. Mild lumbar spondylosis. IMPRESSION: 1. Mild right pericardiophrenic adenopathy is mildly increased, suspicious for metastatic nodes. No abdominopelvic adenopathy. 2. Small left peritoneal soft tissue nodule adjacent to the splenic flexure and smooth left pelvic sidewall peritoneal thickening, cannot exclude residual/recurrent disease. Attention on follow-up CT advised. 3. Bilateral renal collecting system dilatation has improved. 4.  Aortic Atherosclerosis (ICD10-I70.0). Electronically Signed   By: JIlona SorrelM.D.   On: 07/02/2018 13:43    All questions were answered. The patient knows to call the clinic with any problems, questions or concerns. No barriers to learning was detected.  I spent 30 minutes counseling the patient face to face. The total time spent in the appointment was 40 minutes and more than 50% was on counseling and review of test results  NHeath Lark MD 07/17/2018 7:15 AM

## 2018-07-17 NOTE — Assessment & Plan Note (Signed)
She has intermittent leg pain and back pain secondary to degenerative arthritis I recommend judicious use of pain medicine

## 2018-07-17 NOTE — Telephone Encounter (Signed)
-----   Message from Heath Lark, MD sent at 07/17/2018 10:58 AM EDT ----- Regarding: UTI Looks like her urine culture is positive for UTI Please call in cipro 250 mg BID PO x 3 days no refills

## 2018-07-17 NOTE — Assessment & Plan Note (Signed)
We discussed the role of chemotherapy. The intent is palliative.  J Clin Oncol. 2014 May 1;32(13):1302-8. doi: 10.1200/JCO.[id number]. Epub 2014 Mar 17. Bevacizumab combined with chemotherapy for platinum-resistant recurrent ovarian cancer: The AURELIA open-label randomized phase III trial. Pujade-Lauraine E1, Hilpert F, Weber B, Reuss A, Poveda A, [place] Citizen, Sorio R, Verg[place] I, [place], Bamias A, Huntington D, Wimberger P, [place] A, Mirza MR, [place], Bollag D, Ray-Coquard I. Chief Strategy Officer information 1 [employer], Group d'Investigateurs Nationaux pour [employer] (GINECO) and [employer], Assistance [place], [place]; Batrice Weber, GINECO and [employer], [place]; Marin Comment, GINECO and [place], [place]; [place], [place], [place], [place]; [place], [employer]eme[employer] Gynkolog[employer] On[employer]ologie (AGO) and [employer] fr Gynkologie und [place], [place]; Kerin Salen, AGO and [employer] for [employer], [place]; Lauris Poag, AGO and [place] of [place], [place], [place]; Scheryl Marten, Gr[employer] de Pharmacist, community de [place]) and [place], [place]; Nelida Gores, GEICO and [facility], [place], [place]; Noe Gens, [place] Society of Gynaecological Oncology ([place]) and [facility], [place], [place]); Landry Corporal, Tax inspector [place] Trials in Environmental manager and [place] a Field seismologist, [place], [place]; [place], [place] and [place], [place], [place]; Letta Pate, [place] [employer] and [facility], [place], the [place]; Arboriculturist, [place], [place], [place]; Adah Salvage,  [place] and [place] do [place], [place], [place]; [place], [place] and [place], [place], [place]; and [place], [place]. [place], [place].  Erratum in  J Clin Oncol. 2014 Dec 10;32(35):4025.  Abstract PURPOSE:  In platinum-resistant ovarian cancer (OC), single-agent chemotherapy is standard. Bevacizumab is active alone and in combination. AURELIA is the first randomized phase III trial to our knowledge combining bevacizumab with chemotherapy in platinum-resistant OC. PATIENTS AND METHODS:  Eligible patients had measurable/assessable OC that had progressed < 6 months after completing platinum-based therapy. Patients with refractory disease, history of bowel obstruction, or > two prior anticancer regimens were ineligible. After investigators selected chemotherapy (pegylated liposomal doxorubicin, weekly paclitaxel, or topotecan), patients were randomly assigned to single-agent chemotherapy alone or with bevacizumab (10 mg/kg every 2 weeks or 15 mg/kg every 3 weeks) until progression, unacceptable toxicity, or consent withdrawal. Crossover to single-agent bevacizumab was permitted after progression with chemotherapy alone. The primary end point was progression-free survival (PFS) by RECIST. Secondary end points included objective response rate (ORR), overall survival (OS), safety, and patient-reported outcomes. RESULTS:  The PFS hazard ratio (HR) after PFS events in 301 of 361 patients was 0.48 (95% CI, 0.38 to 0.60; unstratified log-rank P < .001). Median PFS was 3.4 months with chemotherapy alone versus 6.7 months with bevacizumab-containing therapy. RECIST ORR was 11.8% versus 27.3%, respectively (P = .001). The OS HR was 0.85 (95% CI, 0.66 to 1.08; P < .174; median OS, 13.3 v 16.6 months, respectively). Grade ? 2 hypertension and proteinuria were more common with bevacizumab. GI perforation occurred in 2.2% of bevacizumab-treated  patients. CONCLUSION:  Adding bevacizumab to chemotherapy statistically significantly improved PFS and ORR; the OS trend was not significant. No new safety signals were observed.  We discussed some of the risks, benefits and side-effects of  Doxil and Avastin  Some of the short term side-effects included, though not limited to, risk of fatigue, weight loss, tumor lysis syndrome, risk of allergic reactions, pancytopenia, life-threatening infections, need for transfusions of blood  products, nausea, vomiting, change in bowel habits, hair loss, risk of congestive heart failure, admission to hospital for various reasons, and risks of death.   Long term side-effects are also discussed including permanent damage to nerve function, chronic fatigue, and rare secondary malignancy including bone marrow disorders.   The patient is aware that the response rates discussed earlier is not guaranteed.    After a long discussion, patient made an informed decision to proceed with the prescribed plan of care.   Patient education material was dispensed  I reviewed results of echocardiogram which is normal She is aware that she would need repeat echocardiogram every 3 months to follow-up on cardiac function I have advised the patient to check her blood pressure twice a day and to report to me if systolic blood pressure is greater than 371 or diastolic blood pressure greater than 95

## 2018-07-17 NOTE — Assessment & Plan Note (Signed)
She has been complaining of smelly urine and worrisome for possible infection I have ordered urinalysis and urine culture and will call the patient with test results

## 2018-07-17 NOTE — Assessment & Plan Note (Signed)
We have further discussions about goals of care The patient has platinum refractory disease We have discussed prognosis in the past and the goals of care is strictly palliative in nature I try my best to explain to the patient to the best of my ability I have indicated my willingness to call the family but the patient insists she will talk to the family herself

## 2018-07-18 LAB — URINE CULTURE: Culture: >=100000 — AB

## 2018-07-30 ENCOUNTER — Other Ambulatory Visit: Payer: Self-pay

## 2018-07-30 ENCOUNTER — Inpatient Hospital Stay: Payer: PPO

## 2018-07-30 ENCOUNTER — Inpatient Hospital Stay (HOSPITAL_BASED_OUTPATIENT_CLINIC_OR_DEPARTMENT_OTHER): Payer: PPO | Admitting: Hematology and Oncology

## 2018-07-30 VITALS — BP 153/70

## 2018-07-30 VITALS — BP 148/62 | HR 52 | Temp 98.5°F | Resp 18 | Ht 62.0 in | Wt 134.2 lb

## 2018-07-30 DIAGNOSIS — C786 Secondary malignant neoplasm of retroperitoneum and peritoneum: Secondary | ICD-10-CM

## 2018-07-30 DIAGNOSIS — C561 Malignant neoplasm of right ovary: Secondary | ICD-10-CM | POA: Diagnosis not present

## 2018-07-30 DIAGNOSIS — Z7951 Long term (current) use of inhaled steroids: Secondary | ICD-10-CM | POA: Diagnosis not present

## 2018-07-30 DIAGNOSIS — I10 Essential (primary) hypertension: Secondary | ICD-10-CM | POA: Diagnosis not present

## 2018-07-30 DIAGNOSIS — M79606 Pain in leg, unspecified: Secondary | ICD-10-CM

## 2018-07-30 DIAGNOSIS — Z7189 Other specified counseling: Secondary | ICD-10-CM

## 2018-07-30 DIAGNOSIS — Z79899 Other long term (current) drug therapy: Secondary | ICD-10-CM

## 2018-07-30 DIAGNOSIS — D61818 Other pancytopenia: Secondary | ICD-10-CM

## 2018-07-30 DIAGNOSIS — C569 Malignant neoplasm of unspecified ovary: Secondary | ICD-10-CM

## 2018-07-30 DIAGNOSIS — M549 Dorsalgia, unspecified: Secondary | ICD-10-CM

## 2018-07-30 LAB — CBC WITH DIFFERENTIAL (CANCER CENTER ONLY)
Abs Immature Granulocytes: 0.02 10*3/uL (ref 0.00–0.07)
Basophils Absolute: 0.1 10*3/uL (ref 0.0–0.1)
Basophils Relative: 1 %
Eosinophils Absolute: 0.3 10*3/uL (ref 0.0–0.5)
Eosinophils Relative: 7 %
HCT: 35.4 % — ABNORMAL LOW (ref 36.0–46.0)
Hemoglobin: 11.4 g/dL — ABNORMAL LOW (ref 12.0–15.0)
Immature Granulocytes: 0 %
Lymphocytes Relative: 30 %
Lymphs Abs: 1.5 10*3/uL (ref 0.7–4.0)
MCH: 30.9 pg (ref 26.0–34.0)
MCHC: 32.2 g/dL (ref 30.0–36.0)
MCV: 95.9 fL (ref 80.0–100.0)
Monocytes Absolute: 0.3 10*3/uL (ref 0.1–1.0)
Monocytes Relative: 7 %
Neutro Abs: 2.9 10*3/uL (ref 1.7–7.7)
Neutrophils Relative %: 55 %
Platelet Count: 143 10*3/uL — ABNORMAL LOW (ref 150–400)
RBC: 3.69 MIL/uL — ABNORMAL LOW (ref 3.87–5.11)
RDW: 15.5 % (ref 11.5–15.5)
WBC Count: 5.1 10*3/uL (ref 4.0–10.5)
nRBC: 0 % (ref 0.0–0.2)

## 2018-07-30 LAB — CMP (CANCER CENTER ONLY)
ALT: 12 U/L (ref 0–44)
AST: 13 U/L — ABNORMAL LOW (ref 15–41)
Albumin: 3.4 g/dL — ABNORMAL LOW (ref 3.5–5.0)
Alkaline Phosphatase: 73 U/L (ref 38–126)
Anion gap: 8 (ref 5–15)
BUN: 8 mg/dL (ref 8–23)
CO2: 26 mmol/L (ref 22–32)
Calcium: 8.9 mg/dL (ref 8.9–10.3)
Chloride: 106 mmol/L (ref 98–111)
Creatinine: 0.71 mg/dL (ref 0.44–1.00)
GFR, Est AFR Am: >60 mL/min (ref >60)
GFR, Estimated: >60 mL/min (ref >60)
Glucose, Bld: 75 mg/dL (ref 70–99)
Potassium: 4 mmol/L (ref 3.5–5.1)
Sodium: 140 mmol/L (ref 135–145)
Total Bilirubin: 0.4 mg/dL (ref 0.3–1.2)
Total Protein: 6.7 g/dL (ref 6.5–8.1)

## 2018-07-30 LAB — TOTAL PROTEIN, URINE DIPSTICK: Protein, ur: NEGATIVE mg/dL

## 2018-07-30 MED ORDER — SODIUM CHLORIDE 0.9 % IV SOLN
Freq: Once | INTRAVENOUS | Status: AC
  Administered 2018-07-30: 13:00:00 via INTRAVENOUS
  Filled 2018-07-30: qty 250

## 2018-07-30 MED ORDER — SODIUM CHLORIDE 0.9% FLUSH
10.0000 mL | INTRAVENOUS | Status: DC | PRN
  Administered 2018-07-30: 10 mL
  Filled 2018-07-30: qty 10

## 2018-07-30 MED ORDER — AMLODIPINE BESYLATE 10 MG PO TABS: 10.0000 mg | ORAL_TABLET | Freq: Every day | ORAL | 3 refills | Status: DC

## 2018-07-30 MED ORDER — SODIUM CHLORIDE 0.9 % IV SOLN
10.0000 mg/kg | Freq: Once | INTRAVENOUS | Status: AC
  Administered 2018-07-30: 600 mg via INTRAVENOUS
  Filled 2018-07-30: qty 16

## 2018-07-30 MED ORDER — OXYCODONE HCL 5 MG PO TABS: 5.0000 mg | ORAL_TABLET | Freq: Four times a day (QID) | ORAL | 0 refills | Status: DC | PRN

## 2018-07-30 MED ORDER — HEPARIN SOD (PORK) LOCK FLUSH 100 UNIT/ML IV SOLN
500.0000 [IU] | Freq: Once | INTRAVENOUS | Status: AC | PRN
  Administered 2018-07-30: 15:00:00 500 [IU]
  Filled 2018-07-30: qty 5

## 2018-07-30 MED ORDER — SODIUM CHLORIDE 0.9% FLUSH
10.0000 mL | Freq: Once | INTRAVENOUS | Status: AC
  Administered 2018-07-30: 10 mL
  Filled 2018-07-30: qty 10

## 2018-07-30 NOTE — Patient Instructions (Signed)

## 2018-07-30 NOTE — Patient Instructions (Signed)
Coronavirus (COVID-19) Are you at risk?  Are you at risk for the Coronavirus (COVID-19)?  To be considered HIGH RISK for Coronavirus (COVID-19), you have to meet the following criteria:  . Traveled to [place], [place], [place], [place] or [place]; or in the [place] to [place], [place], [place], or [place]; and have fever, cough, and shortness of breath within the last 2 weeks of travel OR . Been in close contact with a person diagnosed with COVID-19 within the last 2 weeks and have fever, cough, and shortness of breath . IF YOU DO NOT MEET THESE CRITERIA, YOU ARE CONSIDERED LOW RISK FOR COVID-19.  What to do if you are HIGH RISK for COVID-19?  . If you are having a medical emergency, call 911. . Seek medical care right away. Before you go to a doctor's office, urgent care or emergency department, call ahead and tell them about your recent travel, contact with someone diagnosed with COVID-19, and your symptoms. You should receive instructions from your physician's office regarding next steps of care.  . When you arrive at healthcare provider, tell the healthcare staff immediately you have returned from visiting [place], [place], [place], [place] or [place]; or traveled in the [place] to [place], [place], [place], or [place]; in the last two weeks or you have been in close contact with a person diagnosed with COVID-19 in the last 2 weeks.   . Tell the health care staff about your symptoms: fever, cough and shortness of breath. . After you have been seen by a medical provider, you will be either: o Tested for (COVID-19) and discharged home on quarantine except to seek medical care if symptoms worsen, and asked to  - Stay home and avoid contact with others until you get your results (4-5 days)  - Avoid travel on public transportation if possible (such as bus, train, or airplane) or o Sent to the Emergency Department by EMS for evaluation, COVID-19 testing, and possible  admission depending on your condition and test results.  What to do if you are LOW RISK for COVID-19?  Reduce your risk of any infection by using the same precautions used for avoiding the common cold or flu:  . Wash your hands often with soap and warm water for at least 20 seconds.  If soap and water are not readily available, use an alcohol-based hand sanitizer with at least 60% alcohol.  . If coughing or sneezing, cover your mouth and nose by coughing or sneezing into the elbow areas of your shirt or coat, into a tissue or into your sleeve (not your hands). . Avoid shaking hands with others and consider head nods or verbal greetings only. . Avoid touching your eyes, nose, or mouth with unwashed hands.  . Avoid close contact with people who are sick. . Avoid places or events with large numbers of people in one location, like concerts or sporting events. . Carefully consider travel plans you have or are making. . If you are planning any travel outside or inside the [place], visit the CDC's Travelers' Health webpage for the latest health notices. . If you have some symptoms but not all symptoms, continue to monitor at home and seek medical attention if your symptoms worsen. . If you are having a medical emergency, call 911.   ADDITIONAL HEALTHCARE OPTIONS FOR PATIENTS  [facility] / e-Visit: [email or web address]         [facility]: [phone]  [facility]: [phone]                   [facility]: [phone]  [facility] Discharge Instructions for Patients Receiving Chemotherapy  Today you received the following chemotherapy agents Avastin  To help prevent nausea and vomiting after your treatment, we encourage you to take your nausea medication as directed.    If you develop nausea and vomiting that is not controlled by your nausea medication, call the clinic.   BELOW ARE  SYMPTOMS THAT SHOULD BE REPORTED IMMEDIATELY:  *FEVER GREATER THAN 100.5 F  *CHILLS WITH OR WITHOUT FEVER  NAUSEA AND VOMITING THAT IS NOT CONTROLLED WITH YOUR NAUSEA MEDICATION  *UNUSUAL SHORTNESS OF BREATH  *UNUSUAL BRUISING OR BLEEDING  TENDERNESS IN MOUTH AND THROAT WITH OR WITHOUT PRESENCE OF ULCERS  *URINARY PROBLEMS  *BOWEL PROBLEMS  UNUSUAL RASH Items with * indicate a potential emergency and should be followed up as soon as possible.  Feel free to call the clinic should you have any questions or concerns. The clinic phone number is [phone].  Please show the CHEMO ALERT CARD at check-in to the Emergency Department and triage nurse.   

## 2018-07-31 ENCOUNTER — Encounter: Payer: Self-pay | Admitting: Hematology and Oncology

## 2018-07-31 ENCOUNTER — Telehealth: Payer: Self-pay | Admitting: Hematology and Oncology

## 2018-07-31 LAB — CA 125: Cancer Antigen (CA) 125: 82.6 U/mL — ABNORMAL HIGH (ref 0.0–38.1)

## 2018-07-31 NOTE — Assessment & Plan Note (Signed)
She has pancytopenia but not symptomatic We will proceed with treatment without dose adjustment

## 2018-07-31 NOTE — Progress Notes (Signed)
[facility] OFF[facility] PROGRESS NOTE  Patient Care Team: [place], Jailene G, MD as PCP - General (Family Medicine) Troy Sine, MD as PCP - Cardiology (Cardiology) Awanda Mink Craige Cotta, RN as Oncology Nurse Navigator (Oncology)  ASSESSMENT & PLAN:  Right ovarian epithelial cancer ([place]) Overall, she tolerated chemotherapy well except for some mild side effects such as recent UTI and hypertension We will continue treatment minimum 3 months before repeat imaging study Her tumor marker is mildly elevated but due to lack of symptoms, I recommend we continue treatment and I am hopeful her next tumor marker will improve  Pancytopenia, acquired [facility]) She has pancytopenia but not symptomatic We will proceed with treatment without dose adjustment  Essential hypertension, benign She has mild worsening hypertension likely due to bevacizumab I recommend introduction of second blood pressure medication I recommend her to check her blood pressure twice a day and to call me if systolic blood pressure is greater than 782 or diastolic greater than 90  Lower extremity pain She has intermittent leg pain and back pain secondary to degenerative arthritis I recommend judicious use of pain medicine I refilled her prescription pain medicine   No orders of the defined types were placed in this encounter.   INTERVAL HISTORY: Please see below for problem oriented charting. She returns for cycle 1 day 15 of treatment She tolerated recent treatment well except for persistent leg pain and elevated blood pressure Her urinary symptoms has improved/resolved She denies mouth sores, nausea or changes in bowel habits No recent abdominal bloating or vaginal bleeding No chest pain or shortness of breath Her blood pressure at home is noted to be mildly elevated  SUMMARY OF ONCOLOGIC HISTORY: Oncology History Overview Note  Neg for genetics blood work and HRD   Right ovarian epithelial cancer ([place])   01/06/2018 Imaging   Ct abdomen and pelvis 1. Complex cystic mass at the right adnexa, measuring 5.9 x 4.0 cm, with nodular components, concerning for primary ovarian malignancy. 2. Diffuse nodularity along the omentum at the left side of the abdomen, extending into the mesentery at the left mid abdomen, concerning for peritoneal carcinomatosis. 3. Wall thickening at the distal ileum adjacent to the ovarian mass; bowel loops appear somewhat adherent to the ovarian mass. Bowel infiltration with tumor cannot be excluded. No evidence of bowel obstruction at this time. 4. Small volume ascites within the abdomen and pelvis.  Aortic Atherosclerosis (ICD10-I70.0).   01/08/2018 Tumor Marker   Patient's tumor was tested for the following markers: CA-125. Results of the tumor marker test revealed 3004   01/15/2018 Imaging   Chest CT:  1. No active cardiopulmonary disease. 2. Aortic atherosclerosis without aneurysm or dissection. 3. No large central pulmonary embolus.  CT AP:  1. Dilated fluid-filled loops of small bowel are redemonstrated slightly more extensive than on prior exam with transition point likely in the right adnexa adjacent to a complex cystic mass concerning for ovarian neoplasm given septations and soft tissue nodularity. This raises concern for early or partial SBO. This soft tissue mass measures 4.9 x 4 x 4.7 cm and has not changed since prior recent comparison. Additional short segmental area of luminal narrowing is noted in the right lower quadrant involving small bowel for which stigmata of peritoneal carcinomatosis or small-bowel metastatic implants might account for this. 2. Redemonstration of small volume of ascites predominantly in the upper abdomen surrounding the liver and spleen. 3. Redemonstration of thick bandlike omental thickening concerning for peritoneal carcinomatosis.  01/15/2018 Procedure   Successful ultrasound-guided diagnostic and therapeutic  paracentesis yielding 1.5 liters of peritoneal fluid.    01/15/2018 Pathology Results   PERITONEAL/ASCITIC FLUID (SPECIMEN 1 OF 1 COLLECTED 01/18/18): MALIGNANT CELLS CONSISTENT WITH METASTATIC ADENOCARCINOMA. SEE COMMENT. COMMENT: THE MALIGNANT CELLS ARE POSITIVE FOR MOC-31, CYTOKERATIN 7, ESTROGEN RECEPTOR, PAX-8, AND WT-1. THEY ARE NEGATIVE FOR CALRETININ, CYTOKERATIN 5/6, AND CYTOKERATIN 20. THE PROFILE IS CONSISTENT WITH A PRIMARY GYNECOLOGIC CARCINOMA. THERE IS LIKELY SUFFICIENT TUMOR PRESENT, IF ADDITIONAL STUDIES ARE REQUESTED.   01/15/2018 - 02/02/2018 Hospital Admission   She was admitted to the hospital for SBO. She was treated with chemotherapy   01/18/2018 - 06/20/2018 Chemotherapy   The patient had carboplatin and taxol   02/08/2018 Cancer Staging   Staging form: Ovary, Fallopian Tube, and Primary Peritoneal Carcinoma, AJCC 8th Edition - Clinical: cT3, cN0, cM0 - Signed by Heath Lark, MD on 02/08/2018   02/08/2018 Tumor Marker   Patient's tumor was tested for the following markers: CA-125. Results of the tumor marker test revealed 445   03/02/2018 Tumor Marker   Patient's tumor was tested for the following markers: CA-125. Results of the tumor marker test revealed 174   03/15/2018 Imaging   6.2 cm complex cystic right ovarian mass, compatible with malignant ovarian neoplasm, mildly progressive.  Associated peritoneal disease/omental caking beneath the anterior abdominal wall, mildly improved. Prior abdominal ascites is improved/resolved.  4.7 cm cystic left ovarian mass, without overt malignant features, grossly unchanged.  Mild bilateral hydroureteronephrosis, secondary to extrinsic compression, new.  Prior small bowel obstruction has improved/resolved.    03/27/2018 Pathology Results   1. Omentum, resection for tumor - OMENTUM: - HIGH GRADE SEROUS CARCINOMA. 2. Ovary, left - LEFT OVARY: - HIGH GRADE SEROUS CARCINOMA. - LEFT FALLOPIAN TUBE: - HIGH GRADE SEROUS  CARCINOMA. 3. Adnexa - ovary +/- tube, neoplastic, right - RIGHT OVARY: - HIGH GRADE SEROUS CARCINOMA, 5.5 CM. - RIGHT FALLOPIAN TUBE: - HIGH GRADE SEROUS CARCINOMA. 4. Peritoneum, biopsy, nodule - PERITONEUM: - NODULES OF HIGH GRADE SEROUS CARCINOMA. 5. Peritoneum, resection for tumor, rectosigmoid - RECTOSIGMOID PERITONEUM: - HIGH GRADE SEROUS CARCINOMA. Microscopic Comment 3. OVARY or FALLOPIAN TUBE or PRIMARY PERITONEUM: Procedure: Bilateral salpingo-oophorectomy, omentectomy and peritoneal biopsies. Specimen Integrity: N/A. Tumor Site: Right ovary. Ovarian Surface Involvement (required only if applicable): Yes. Fallopian Tube Surface Involvement (required only if applicable): Yes. Tumor Size: 5.5 x 4.8 x 4.0 cm. Histologic Type: Serous carcinoma. Histologic Grade: High grade. Implants (required for advanced stage serous/seromucinous borderline tumors only): Omentum, left ovary and fallopian tube, rectosigmoid peritoneum and peritoneum. Other Tissue/ Organ Involvement: Left ovary and fallopian tube, omentum and rectosigmoid peritoneum. Largest Extrapelvic Peritoneal Focus (required only if applicable): 07.6 cm, omentum. Peritoneal/Ascitic Fluid: N/A. Treatment Effect (required only for high-grade serous carcinomas): Minimal. Regional Lymph Nodes: No lymph nodes submitted. Pathologic Stage Classification (pTNM, AJCC 8th Edition): pT3c, pNX. Representative Tumor Block: 3A, 3B, 3D, 3E and 109F. Comment(s): There is a 5.5 cm in greatest dimension partially cystic high grade serous carcinoma involving the right adnexal specimen and the tumor is staged as a primary right ovarian carcinoma. The carcinoma also involves the left ovary as well as the left fallopian tube, the omentum, peritoneum and the rectosigmoid peritoneal specimens.   03/27/2018 Surgery   Preoperative Diagnosis: ovarian cancer, stage IIIC, metastatic to omentum, peritoneum, serosa of intestines   Procedure(s)  Performed: 1. Exploratory laparotomy with bilateral salpingo-oophorectomy, omentectomy radical tumor debulking for ovarian cancer .  Surgeon: Thereasa Solo, MD.   Specimens:  Bilateral tubes / ovaries, omentum. Peritoneal nodules, rectosigmoid nodules.    Operative Findings: omental cake, miliary studding on diaphragm and bilateral paracolic gutters. Sigmoid colon densely adherent to left and right ovaries with tumor rind, ureters mildly dilated bilaterally due to retroperitoneal extension of the tumor towards ureters causing compression.    This represented an optimal cytoreduction (R1) with gross residual disease on the diaphragm that had been ablated and a thin tumor plaque on the sigmoid colon that was ablated. No residual disease >1cm remaining   05/07/2018 Genetic Testing   Negative genetic testing on the [facility] TumorNextHRD+ CancerNext Panel. The CancerNext gene panel offered by [employer] includes sequencing and rearrangement analysis for the following 34 genes:   APC, ATM, BARD1, BMPR1A, BRCA1, BRCA2, BRIP1, CDH1, CDK4, CDKN2A, CHEK2, DICER1, EPCAM, GREM1, HOXB13, MLH1, MRE11A, MSH2, MSH6, MUTYH, NBN, NF1, PALB2, PMS2, POLD1, POLE, PTEN, RAD50, RAD51C, RAD51D, SMAD4, SMARCA4, STK11, and TP53. Somatic genes analyzed through TumorNext-HRD: ATM, BARD1, BRCA1, BRCA2, BRIP1, CHEK2, MRE11A, NBN, PALB2, RAD51C, RAD51D. The report date is 05/07/2018.   05/11/2018 Tumor Marker   Patient's tumor was tested for the following markers: CA-125. Results of the tumor marker test revealed 60.5   07/02/2018 Imaging   1. Mild right pericardiophrenic adenopathy is mildly increased, suspicious for metastatic nodes. No abdominopelvic adenopathy. 2. Small left peritoneal soft tissue nodule adjacent to the splenic flexure and smooth left pelvic sidewall peritoneal thickening, cannot exclude residual/recurrent disease. Attention on follow-up CT advised. 3. Bilateral renal collecting system dilatation has  improved. 4.  Aortic Atherosclerosis (ICD10-I70.0).   07/02/2018 Tumor Marker   Patient's tumor was tested for the following markers: CA-125 Results of the tumor marker test revealed 42.7   07/11/2018 Echocardiogram   1. The left ventricle has normal systolic function with an ejection fraction of 60-65%. The cavity size was normal. Left ventricular diastolic parameters were normal.  2. Normal GLS -20.1.  3. The right ventricle has normal systolic function. The cavity was normal. There is no increase in right ventricular wall thickness.  4. The mitral valve is degenerative. Moderate thickening of the mitral valve leaflet. Moderate calcification of the mitral valve leaflet.  5. The aortic valve is tricuspid. Moderate thickening of the aortic valve. Moderate calcification of the aortic valve. Aortic valve regurgitation is trivial by color flow Doppler.  6. The aortic root is normal in size and structure.    Chemotherapy   The patient had doxorubicin and bevacizumab for chemotherapy treatment.     07/30/2018 Tumor Marker   Patient's tumor was tested for the following markers: CA-125 Results of the tumor marker test revealed 82.6     REVIEW OF SYSTEMS:   Constitutional: Denies fevers, chills or abnormal weight loss Eyes: Denies blurriness of vision Ears, nose, mouth, throat, and face: Denies mucositis or sore throat Respiratory: Denies cough, dyspnea or wheezes Cardiovascular: Denies palpitation, chest discomfort or lower extremity swelling Gastrointestinal:  Denies nausea, heartburn or change in bowel habits Skin: Denies abnormal skin rashes Lymphatics: Denies new lymphadenopathy or easy bruising Neurological:Denies numbness, tingling or new weaknesses Behavioral/Psych: Mood is stable, no new changes  All other systems were reviewed with the patient and are negative.  I have reviewed the past medical history, past surgical history, social history and family history with the patient and  they are unchanged from previous note.  ALLERGIES:  is allergic to tramadol hcl.  MEDICATIONS:  Current Outpatient Medications  Medication Sig Dispense Refill  . acetaminophen (TYLENOL) 500 MG tablet Take 2  tablets (1,000 mg total) by mouth every 6 (six) hours. (Patient taking differently: Take 1,000 mg by mouth every 6 (six) hours as needed. ) 30 tablet 0  . ALPRAZolam (XANAX) 0.5 MG tablet Take 0.5-1 tablets (0.25-0.5 mg total) by mouth daily as needed for anxiety. 30 tablet 2  . alum & mag hydroxide-simeth (MAALOX/MYLANTA) 200-200-20 MG/5ML suspension Take 30 mLs by mouth every 6 (six) hours as needed for indigestion or heartburn. 355 mL 0  . amLODipine (NORVASC) 10 MG tablet Take 1 tablet (10 mg total) by mouth daily. 90 tablet 3  . Calcium Carbonate (CALTRATE 600) 1500 MG TABS Take 600 mg of elemental calcium by mouth daily.     . cholecalciferol (VITAMIN D) 1000 UNITS tablet Take 1,000 Units by mouth daily.     . fluticasone (FLONASE) 50 MCG/ACT nasal spray Use 2 sprays in each nostril daily (Patient taking differently: Place 2 sprays into both nostrils daily. ) 48 g 2  . ibuprofen (ADVIL,MOTRIN) 200 MG tablet Take 600 mg by mouth every 6 (six) hours as needed for moderate pain.     Marland Kitchen lactose free nutrition (BOOST PLUS) LIQD Take 237 mLs by mouth daily. (Patient taking differently: Take 237 mLs by mouth 2 (two) times daily between meals. ) 10 Can 0  . levothyroxine (SYNTHROID, LEVOTHROID) 75 MCG tablet Take 1 tablet (75 mcg total) by mouth daily. (Patient taking differently: Take 75 mcg by mouth daily before breakfast. ) 90 tablet 3  . lidocaine-prilocaine (EMLA) cream Apply 1 application topically as needed. 30 g 6  . lidocaine-prilocaine (EMLA) cream Apply to affected area once 30 g 3  . Magnesium 400 MG TABS Take 250 mg by mouth daily.    . Menthol-Methyl Salicylate (MUSCLE RUB) 10-15 % CREA Apply 1 application topically as needed for muscle pain.    . metoprolol succinate (TOPROL-XL)  50 MG 24 hr tablet Take 1 tablet (50 mg total) by mouth daily. Take with or immediately following a meal. 90 tablet 3  . Multiple Vitamins-Minerals (MULTIVITAMIN WITH MINERALS) tablet Take 1 tablet by mouth daily.      . Omega-3 Fatty Acids (FISH OIL) 1000 MG CAPS Take 2,000 mg by mouth 2 (two) times daily.     Marland Kitchen omeprazole (PRILOSEC) 40 MG capsule Take 1 capsule (40 mg total) by mouth daily. 90 capsule 3  . ondansetron (ZOFRAN) 8 MG tablet Take 1 tablet (8 mg total) by mouth every 8 (eight) hours as needed for nausea. 90 tablet 3  . ondansetron (ZOFRAN) 8 MG tablet Take 1 tablet (8 mg total) by mouth every 8 (eight) hours as needed (Nausea or vomiting). 30 tablet 1  . oxyCODONE (OXY IR/ROXICODONE) 5 MG immediate release tablet Take 1 tablet (5 mg total) by mouth every 6 (six) hours as needed for severe pain. 30 tablet 0  . polyethylene glycol (MIRALAX / GLYCOLAX) packet Take 17 g by mouth daily. (Patient not taking: Reported on 06/08/2018) 14 each 0  . prochlorperazine (COMPAZINE) 10 MG tablet Take 1 tablet (10 mg total) by mouth every 6 (six) hours as needed for nausea or vomiting. (Patient not taking: Reported on 05/02/2018) 60 tablet 11  . prochlorperazine (COMPAZINE) 10 MG tablet Take 1 tablet (10 mg total) by mouth every 6 (six) hours as needed (Nausea or vomiting). 30 tablet 1  . rivaroxaban (XARELTO) 20 MG TABS tablet Take 1 tablet (20 mg total) by mouth daily with supper. 14 tablet 0  . senna (SENOKOT) 8.6 MG TABS tablet  Take 1 tablet (8.6 mg total) by mouth at bedtime as needed for mild constipation. 120 each 0  . senna-docusate (SENOKOT-S) 8.6-50 MG tablet Take 2 tablets by mouth at bedtime. For AFTER surgery 30 tablet 0  . simvastatin (ZOCOR) 20 MG tablet Take 1 tablet (20 mg total) by mouth at bedtime. 90 tablet 3  . vitamin C (ASCORBIC ACID) 500 MG tablet Take 1,000 mg by mouth daily.     No current facility-administered medications for this visit.     PHYSICAL EXAMINATION: ECOG  PERFORMANCE STATUS: 1 - Symptomatic but completely ambulatory  Vitals:   07/30/18 1213  BP: (!) 148/62  Pulse: (!) 52  Resp: 18  Temp: 98.5 F (36.9 C)  SpO2: 100%   Filed Weights   07/30/18 1213  Weight: 134 lb 3.2 oz (60.9 kg)    GENERAL:alert, no distress and comfortable SKIN: skin color, texture, turgor are normal, no rashes or significant lesions EYES: normal, Conjunctiva are pink and non-injected, sclera clear OROPHARYNX:no exudate, no erythema and lips, buccal mucosa, and tongue normal  NECK: supple, thyroid normal size, non-tender, without nodularity LYMPH:  no palpable lymphadenopathy in the cervical, axillary or inguinal LUNGS: clear to auscultation and percussion with normal breathing effort HEART: regular rate & rhythm and no murmurs and no lower extremity edema ABDOMEN:abdomen soft, non-tender and normal bowel sounds Musculoskeletal:no cyanosis of digits and no clubbing  NEURO: alert & oriented x 3 with fluent speech, no focal motor/sensory deficits  LABORATORY DATA:  I have reviewed the data as listed    Component Value Date/Time   NA 140 07/30/2018 1142   K 4.0 07/30/2018 1142   CL 106 07/30/2018 1142   CO2 26 07/30/2018 1142   GLUCOSE 75 07/30/2018 1142   BUN 8 07/30/2018 1142   CREATININE 0.71 07/30/2018 1142   CREATININE 0.76 11/17/2015 1005   CALCIUM 8.9 07/30/2018 1142   PROT 6.7 07/30/2018 1142   ALBUMIN 3.4 (L) 07/30/2018 1142   AST 13 (L) 07/30/2018 1142   ALT 12 07/30/2018 1142   ALKPHOS 73 07/30/2018 1142   BILITOT 0.4 07/30/2018 1142   GFRNONAA >60 07/30/2018 1142   GFRAA >60 07/30/2018 1142    No results found for: SPEP, UPEP  Lab Results  Component Value Date   WBC 5.1 07/30/2018   NEUTROABS 2.9 07/30/2018   HGB 11.4 (L) 07/30/2018   HCT 35.4 (L) 07/30/2018   MCV 95.9 07/30/2018   PLT 143 (L) 07/30/2018      Chemistry      Component Value Date/Time   NA 140 07/30/2018 1142   K 4.0 07/30/2018 1142   CL 106 07/30/2018 1142    CO2 26 07/30/2018 1142   BUN 8 07/30/2018 1142   CREATININE 0.71 07/30/2018 1142   CREATININE 0.76 11/17/2015 1005      Component Value Date/Time   CALCIUM 8.9 07/30/2018 1142   ALKPHOS 73 07/30/2018 1142   AST 13 (L) 07/30/2018 1142   ALT 12 07/30/2018 1142   BILITOT 0.4 07/30/2018 1142       RADIOGRAPHIC STUDIES: I have personally reviewed the radiological images as listed and agreed with the findings in the report. Ct Abdomen Pelvis W Contrast  Result Date: 07/02/2018 CLINICAL DATA:  Right ovarian epithelial cancer status post bilateral salpingo-oophorectomy, omentectomy and debulking 03/27/2018 and adjuvant chemotherapy. Restaging. EXAM: CT ABDOMEN AND PELVIS WITH CONTRAST TECHNIQUE: Multidetector CT imaging of the abdomen and pelvis was performed using the standard protocol following bolus administration of intravenous contrast.  CONTRAST:  167m OMNIPAQUE IOHEXOL 300 MG/ML  SOLN COMPARISON:  03/15/2018 CT abdomen/pelvis. FINDINGS: Lower chest: No significant pulmonary nodules or acute consolidative airspace disease. Mild right pericardiophrenic adenopathy measuring up to 1.1 cm (series 2/image 10), mildly increased from 0.8 cm. Tip of superior approach central venous catheter is seen at the cavoatrial junction. Hepatobiliary: Normal liver size. Simple 1.4 cm left liver dome cyst. Additional subcentimeter hypodense lateral segment left liver lobe lesions are too small to characterize and are stable. No new liver lesions. Normal gallbladder with no radiopaque cholelithiasis. No biliary ductal dilatation. Pancreas: Normal, with no mass or duct dilation. Spleen: Normal size. No mass. Adrenals/Urinary Tract: Normal adrenals. Mild fullness of the central renal collecting systems without overt hydronephrosis, improved from prior. No renal masses. Normal bladder. Stomach/Bowel: Normal non-distended stomach. Normal caliber small bowel with no small bowel wall thickening. Appendectomy. Oral contrast  transits to left colon. Minimal sigmoid diverticulosis, no large bowel wall thickening or acute pericolonic fat stranding. Vascular/Lymphatic: Atherosclerotic nonaneurysmal abdominal aorta. Patent portal, splenic, hepatic and renal veins. No pathologically enlarged lymph nodes in the abdomen or pelvis. Reproductive: Status post hysterectomy, with no abnormal findings at the vaginal cuff. No adnexal mass. Other: No pneumoperitoneum, ascites or focal fluid collection. There is a 0.7 cm left peritoneal soft tissue nodule adjacent to splenic flexure (series 2/image 32). No additional peritoneal nodules. Left pelvic sidewall smooth peritoneal thickening (series 2/image 60). Musculoskeletal: No aggressive appearing focal osseous lesions. Mild lumbar spondylosis. IMPRESSION: 1. Mild right pericardiophrenic adenopathy is mildly increased, suspicious for metastatic nodes. No abdominopelvic adenopathy. 2. Small left peritoneal soft tissue nodule adjacent to the splenic flexure and smooth left pelvic sidewall peritoneal thickening, cannot exclude residual/recurrent disease. Attention on follow-up CT advised. 3. Bilateral renal collecting system dilatation has improved. 4.  Aortic Atherosclerosis (ICD10-I70.0). Electronically Signed   By: JIlona SorrelM.D.   On: 07/02/2018 13:43    All questions were answered. The patient knows to call the clinic with any problems, questions or concerns. No barriers to learning was detected.  I spent 25 minutes counseling the patient face to face. The total time spent in the appointment was 30 minutes and more than 50% was on counseling and review of test results  NHeath Lark MD 07/31/2018 2:08 PM

## 2018-07-31 NOTE — Telephone Encounter (Signed)
Scheduled appt per 7/21 sch message - spoke with patient and she is aware of appt date and time

## 2018-07-31 NOTE — Assessment & Plan Note (Signed)
She has mild worsening hypertension likely due to bevacizumab I recommend introduction of second blood pressure medication I recommend her to check her blood pressure twice a day and to call me if systolic blood pressure is greater than 482 or diastolic greater than 90

## 2018-07-31 NOTE — Assessment & Plan Note (Signed)
Overall, she tolerated chemotherapy well except for some mild side effects such as recent UTI and hypertension We will continue treatment minimum 3 months before repeat imaging study Her tumor marker is mildly elevated but due to lack of symptoms, I recommend we continue treatment and I am hopeful her next tumor marker will improve

## 2018-07-31 NOTE — Assessment & Plan Note (Signed)
She has intermittent leg pain and back pain secondary to degenerative arthritis I recommend judicious use of pain medicine I refilled her prescription pain medicine

## 2018-08-01 ENCOUNTER — Telehealth: Payer: Self-pay | Admitting: Oncology

## 2018-08-01 NOTE — Telephone Encounter (Signed)
[facility] called and asked if it is [place] for her to see her dermatologist for a skin cancer check (has an area by her nose that may need to be "frozen") and to schedule a mammogram (due in December).

## 2018-08-01 NOTE — Telephone Encounter (Signed)
Called Cher back and let her know that she can schedule her mammogram and dermatologist appointment.

## 2018-08-01 NOTE — Telephone Encounter (Signed)
ok 

## 2018-08-06 ENCOUNTER — Telehealth: Payer: Self-pay | Admitting: Oncology

## 2018-08-06 ENCOUNTER — Encounter: Payer: Self-pay | Admitting: Hematology and Oncology

## 2018-08-06 ENCOUNTER — Telehealth: Payer: Self-pay | Admitting: Cardiovascular Disease

## 2018-08-06 NOTE — Telephone Encounter (Signed)
Spoke with pt who states that her oncologist Alvy Bimler, Ni advised her on 7/20 to start taking amlodipine 10 mg daily because the side effect of one of her cancer meds is elevated BP and she wants to know if she should take both her metoprolol 50 mg and amlodipine 10 mg at the same time since they are both daily meds. She states that she has logged the following BPs and HRs since 7/21 but is not completely sure about the time/time-of-day logged:  7/21: 170/82 HR53 7/23: 119/68 HR63 7/25: 172/85 HR53; 160/84 HR52 7/26:154/90 HR57 7/27: 167/93 HR65 (9:30AM) Current: 172/91 HR59   Pt states she has already taken her metoprolol this AM. Advised her to take amlodipine now then check BP in 1-2 hrs. Also advised that pt may take metoprolol and amlodipine together starting tomorrow and should check BP 1-2 hours after and then call office to report readings and informed her that message would be routed to Dr. Claiborne Billings and his nurse Almyra Free to advise further and for any recommendations. Pt verbalized understanding

## 2018-08-06 NOTE — Telephone Encounter (Signed)
Haley [name] called and said she has the new blood pressure medication - amlodipine and wants to ask Dr. Alvy Bimler what time of day she should take it.  She has metoprolol which she takes in the mornings so she is wondering if she can take amlodipine at the same time.  Discussed that they work in different ways so she should be able to take them at the same time but that I will check with Dr. Alvy Bimler.  She mentioned that her bp today was 167/93 with a 65 pulse.

## 2018-08-06 NOTE — Telephone Encounter (Signed)
° ° ° °  Pt c/o medication issue:  1. Name of Medication: amlodipine   2. How are you currently taking this medication (dosage and times per day)? 10 mg 1 x daily   3. Are you having a reaction (difficulty breathing--STAT)? No   4. What is your medication issue? Pt says her BP is running high and is wondering if she can take it with the Metoprolol She says her Cancer Dr prescribed her the Amlodipine    Please call

## 2018-08-07 NOTE — Telephone Encounter (Signed)
Follow Up  Patient is calling in to notify nurse on her blood pressure readings now that she has been taking the amlodipine. Blood Pressure Readings are:   172/91 59- after taking medicine yesterday 150/78 60- today at 10:00 am

## 2018-08-07 NOTE — Telephone Encounter (Signed)
Called Haley [name] and advised her of message from Dr. Alvy Bimler.  She verbalized agreement and she said she has been talking to the nurse at her cardiologist's office and is taking them at the same time.  Also discussed her hgb and CA 125 results from 07/30/18.  Advised her that her hgb may fluctuate during treatment and that it is very close to normal.  For her CA 125, discussed that Dr. Alvy Bimler is hopefull that it will come back down with treatment.

## 2018-08-07 NOTE — Telephone Encounter (Signed)
Returned call to patient she stated she is taking a cancer treatment that elevates B/P.Cancer Dr.prescribed Amlodipine 10 mg daily.She just started taking yesterday.B/P readings listed below.Advised to take B/P daily for 2 weeks and call back to report B/P readings.

## 2018-08-07 NOTE — Telephone Encounter (Signed)
No right or wrong answer I typically recommend a different time to get the BP medication spaced out so they don't feel dizzy but if she cannot remember she can take them at the same time

## 2018-08-08 NOTE — Telephone Encounter (Signed)
acknowledged

## 2018-08-13 ENCOUNTER — Telehealth: Payer: Self-pay | Admitting: Hematology and Oncology

## 2018-08-13 ENCOUNTER — Inpatient Hospital Stay (HOSPITAL_BASED_OUTPATIENT_CLINIC_OR_DEPARTMENT_OTHER): Payer: PPO | Admitting: Hematology and Oncology

## 2018-08-13 ENCOUNTER — Other Ambulatory Visit: Payer: Self-pay

## 2018-08-13 ENCOUNTER — Inpatient Hospital Stay: Payer: PPO

## 2018-08-13 ENCOUNTER — Inpatient Hospital Stay: Payer: PPO | Attending: Obstetrics

## 2018-08-13 VITALS — BP 148/72 | HR 63 | Temp 98.0°F | Resp 18 | Ht 62.0 in | Wt 136.0 lb

## 2018-08-13 DIAGNOSIS — N390 Urinary tract infection, site not specified: Secondary | ICD-10-CM | POA: Diagnosis not present

## 2018-08-13 DIAGNOSIS — Z7189 Other specified counseling: Secondary | ICD-10-CM

## 2018-08-13 DIAGNOSIS — Z90722 Acquired absence of ovaries, bilateral: Secondary | ICD-10-CM | POA: Insufficient documentation

## 2018-08-13 DIAGNOSIS — I10 Essential (primary) hypertension: Secondary | ICD-10-CM | POA: Insufficient documentation

## 2018-08-13 DIAGNOSIS — D61818 Other pancytopenia: Secondary | ICD-10-CM | POA: Insufficient documentation

## 2018-08-13 DIAGNOSIS — Z7901 Long term (current) use of anticoagulants: Secondary | ICD-10-CM | POA: Diagnosis not present

## 2018-08-13 DIAGNOSIS — C786 Secondary malignant neoplasm of retroperitoneum and peritoneum: Secondary | ICD-10-CM | POA: Insufficient documentation

## 2018-08-13 DIAGNOSIS — Z79899 Other long term (current) drug therapy: Secondary | ICD-10-CM | POA: Diagnosis not present

## 2018-08-13 DIAGNOSIS — Z5112 Encounter for antineoplastic immunotherapy: Secondary | ICD-10-CM | POA: Insufficient documentation

## 2018-08-13 DIAGNOSIS — C561 Malignant neoplasm of right ovary: Secondary | ICD-10-CM | POA: Diagnosis not present

## 2018-08-13 DIAGNOSIS — Z9079 Acquired absence of other genital organ(s): Secondary | ICD-10-CM | POA: Insufficient documentation

## 2018-08-13 DIAGNOSIS — C569 Malignant neoplasm of unspecified ovary: Secondary | ICD-10-CM

## 2018-08-13 DIAGNOSIS — M79606 Pain in leg, unspecified: Secondary | ICD-10-CM

## 2018-08-13 DIAGNOSIS — Z5111 Encounter for antineoplastic chemotherapy: Secondary | ICD-10-CM | POA: Insufficient documentation

## 2018-08-13 LAB — CBC WITH DIFFERENTIAL (CANCER CENTER ONLY)
Abs Immature Granulocytes: 0.03 10*3/uL (ref 0.00–0.07)
Basophils Absolute: 0.1 10*3/uL (ref 0.0–0.1)
Basophils Relative: 1 %
Eosinophils Absolute: 0.3 10*3/uL (ref 0.0–0.5)
Eosinophils Relative: 6 %
HCT: 39.7 % (ref 36.0–46.0)
Hemoglobin: 12.7 g/dL (ref 12.0–15.0)
Immature Granulocytes: 1 %
Lymphocytes Relative: 33 %
Lymphs Abs: 1.8 10*3/uL (ref 0.7–4.0)
MCH: 30.8 pg (ref 26.0–34.0)
MCHC: 32 g/dL (ref 30.0–36.0)
MCV: 96.1 fL (ref 80.0–100.0)
Monocytes Absolute: 0.8 10*3/uL (ref 0.1–1.0)
Monocytes Relative: 14 %
Neutro Abs: 2.4 10*3/uL (ref 1.7–7.7)
Neutrophils Relative %: 45 %
Platelet Count: 144 10*3/uL — ABNORMAL LOW (ref 150–400)
RBC: 4.13 MIL/uL (ref 3.87–5.11)
RDW: 15 % (ref 11.5–15.5)
WBC Count: 5.3 10*3/uL (ref 4.0–10.5)
nRBC: 0 % (ref 0.0–0.2)

## 2018-08-13 LAB — TOTAL PROTEIN, URINE DIPSTICK: Protein, ur: NEGATIVE mg/dL

## 2018-08-13 LAB — CMP (CANCER CENTER ONLY)
ALT: 13 U/L (ref 0–44)
AST: 15 U/L (ref 15–41)
Albumin: 3.6 g/dL (ref 3.5–5.0)
Alkaline Phosphatase: 78 U/L (ref 38–126)
Anion gap: 8 (ref 5–15)
BUN: 10 mg/dL (ref 8–23)
CO2: 25 mmol/L (ref 22–32)
Calcium: 9.1 mg/dL (ref 8.9–10.3)
Chloride: 108 mmol/L (ref 98–111)
Creatinine: 0.69 mg/dL (ref 0.44–1.00)
GFR, Est AFR Am: >60 mL/min (ref >60)
GFR, Estimated: >60 mL/min (ref >60)
Glucose, Bld: 80 mg/dL (ref 70–99)
Potassium: 3.8 mmol/L (ref 3.5–5.1)
Sodium: 141 mmol/L (ref 135–145)
Total Bilirubin: 0.5 mg/dL (ref 0.3–1.2)
Total Protein: 6.9 g/dL (ref 6.5–8.1)

## 2018-08-13 MED ORDER — DEXTROSE 5 % IV SOLN
INTRAVENOUS | Status: DC
  Administered 2018-08-13: 14:00:00 via INTRAVENOUS
  Filled 2018-08-13: qty 250

## 2018-08-13 MED ORDER — HEPARIN SOD (PORK) LOCK FLUSH 100 UNIT/ML IV SOLN
500.0000 [IU] | Freq: Once | INTRAVENOUS | Status: AC | PRN
  Administered 2018-08-13: 16:00:00 500 [IU]
  Filled 2018-08-13: qty 5

## 2018-08-13 MED ORDER — DOXORUBICIN HCL LIPOSOMAL CHEMO INJECTION 2 MG/ML
60.0000 mg | Freq: Once | INTRAVENOUS | Status: AC
  Administered 2018-08-13: 14:00:00 60 mg via INTRAVENOUS
  Filled 2018-08-13: qty 10

## 2018-08-13 MED ORDER — HYDROCHLOROTHIAZIDE 25 MG PO TABS: 25.0000 mg | ORAL_TABLET | Freq: Every day | ORAL | 1 refills | Status: DC

## 2018-08-13 MED ORDER — DEXAMETHASONE SODIUM PHOSPHATE 10 MG/ML IJ SOLN
10.0000 mg | Freq: Once | INTRAMUSCULAR | Status: AC
  Administered 2018-08-13: 10 mg via INTRAVENOUS

## 2018-08-13 MED ORDER — SODIUM CHLORIDE 0.9 % IV SOLN
Freq: Once | INTRAVENOUS | Status: AC
  Administered 2018-08-13: 13:00:00 via INTRAVENOUS
  Filled 2018-08-13: qty 250

## 2018-08-13 MED ORDER — SODIUM CHLORIDE 0.9% FLUSH
10.0000 mL | Freq: Once | INTRAVENOUS | Status: AC
  Administered 2018-08-13: 10 mL
  Filled 2018-08-13: qty 10

## 2018-08-13 MED ORDER — SODIUM CHLORIDE 0.9 % IV SOLN
10.0000 mg/kg | Freq: Once | INTRAVENOUS | Status: AC
  Administered 2018-08-13: 14:00:00 600 mg via INTRAVENOUS
  Filled 2018-08-13: qty 16

## 2018-08-13 MED ORDER — SODIUM CHLORIDE 0.9% FLUSH
10.0000 mL | INTRAVENOUS | Status: DC | PRN
  Administered 2018-08-13: 16:00:00 10 mL
  Filled 2018-08-13: qty 10

## 2018-08-13 MED ORDER — DEXAMETHASONE SODIUM PHOSPHATE 10 MG/ML IJ SOLN
INTRAMUSCULAR | Status: AC
  Filled 2018-08-13: qty 1

## 2018-08-13 NOTE — Telephone Encounter (Signed)
No 8/3 los/schedule message, orders, referrals at time of check out.

## 2018-08-13 NOTE — Patient Instructions (Addendum)
[facility] Discharge Instructions for Patients Receiving Chemotherapy  Today you received the following chemotherapy agents : Bevacizumab (AVASTIN) & Doxorubicin (DOXIL).  To help prevent nausea and vomiting after your treatment, we encourage you to take your nausea medication as prescribed.  Do  Drink lots of fluids  As  Tolerated.  Do Not  Eat  Greasy  Nor  Spicy  Foods.   If you develop nausea and vomiting that is not controlled by your nausea medication, call the clinic.   BELOW ARE SYMPTOMS THAT SHOULD BE REPORTED IMMEDIATELY:  *FEVER GREATER THAN 100.5 F  *CHILLS WITH OR WITHOUT FEVER  NAUSEA AND VOMITING THAT IS NOT CONTROLLED WITH YOUR NAUSEA MEDICATION  *UNUSUAL SHORTNESS OF BREATH  *UNUSUAL BRUISING OR BLEEDING  TENDERNESS IN MOUTH AND THROAT WITH OR WITHOUT PRESENCE OF ULCERS  *URINARY PROBLEMS  *BOWEL PROBLEMS  UNUSUAL RASH Items with * indicate a potential emergency and should be followed up as soon as possible.  Feel free to call the clinic should you have any questions or concerns. The clinic phone number is [phone].  Please show the [facility] at check-in to the Emergency Department and triage nurse.  Bevacizumab injection What is this medicine? BEVACIZUMAB (be va SIZ yoo mab) is a monoclonal antibody. It is used to treat many types of cancer. This medicine may be used for other purposes; ask your health care provider or pharmacist if you have questions. COMMON BRAND NAME(S): Avastin, MVASI, Zirabev What should I tell my health care provider before I take this medicine? They need to know if you have any of these conditions:  diabetes  heart disease  high blood pressure  history of coughing up blood  prior anthracycline chemotherapy (e.g., doxorubicin, daunorubicin, epirubicin)  recent or ongoing radiation therapy  recent or planning to have surgery  stroke  an unusual or allergic reaction to bevacizumab, hamster  proteins, mouse proteins, other medicines, foods, dyes, or preservatives  pregnant or trying to get pregnant  breast-feeding How should I use this medicine? This medicine is for infusion into a vein. It is given by a health care professional in a hospital or clinic setting. Talk to your pediatrician regarding the use of this medicine in children. Special care may be needed. Overdosage: If you think you have taken too much of this medicine contact a poison control center or emergency room at once. NOTE: This medicine is only for you. Do not share this medicine with others. What if I miss a dose? It is important not to miss your dose. Call your doctor or health care professional if you are unable to keep an appointment. What may interact with this medicine? Interactions are not expected. This list may not describe all possible interactions. Give your health care provider a list of all the medicines, herbs, non-prescription drugs, or dietary supplements you use. Also tell them if you smoke, drink alcohol, or use illegal drugs. Some items may interact with your medicine. What should I watch for while using this medicine? Your condition will be monitored carefully while you are receiving this medicine. You will need important blood work and urine testing done while you are taking this medicine. This medicine may increase your risk to bruise or bleed. Call your doctor or health care professional if you notice any unusual bleeding. This medicine should be started at least 28 days following major surgery and the site of the surgery should be totally healed. Check with your doctor before scheduling  dental work or surgery while you are receiving this treatment. Talk to your doctor if you have recently had surgery or if you have a wound that has not healed. Do not become pregnant while taking this medicine or for 6 months after stopping it. Women should inform their doctor if they wish to become pregnant or  think they might be pregnant. There is a potential for serious side effects to an unborn child. Talk to your health care professional or pharmacist for more information. Do not breast-feed an infant while taking this medicine and for 6 months after the last dose. This medicine has caused ovarian failure in some women. This medicine may interfere with the ability to have a child. You should talk to your doctor or health care professional if you are concerned about your fertility. What side effects may I notice from receiving this medicine? Side effects that you should report to your doctor or health care professional as soon as possible:  allergic reactions like skin rash, itching or hives, swelling of the face, lips, or tongue  chest pain or chest tightness  chills  coughing up blood  high fever  seizures  severe constipation  signs and symptoms of bleeding such as bloody or black, tarry stools; red or dark-brown urine; spitting up blood or brown material that looks like coffee grounds; red spots on the skin; unusual bruising or bleeding from the eye, gums, or nose  signs and symptoms of a blood clot such as breathing problems; chest pain; severe, sudden headache; pain, swelling, warmth in the leg  signs and symptoms of a stroke like changes in vision; confusion; trouble speaking or understanding; severe headaches; sudden numbness or weakness of the face, arm or leg; trouble walking; dizziness; loss of balance or coordination  stomach pain  sweating  swelling of legs or ankles  vomiting  weight gain Side effects that usually do not require medical attention (report to your doctor or health care professional if they continue or are bothersome):  back pain  changes in taste  decreased appetite  dry skin  nausea  tiredness This list may not describe all possible side effects. Call your doctor for medical advice about side effects. You may report side effects to FDA at  1-800-FDA-1088. Where should I keep my medicine? This drug is given in a hospital or clinic and will not be stored at home. NOTE: This sheet is a summary. It may not cover all possible information. If you have questions about this medicine, talk to your doctor, pharmacist, or health care provider.  2020 Elsevier/Gold Standard (2015-12-25 14:33:29)  Doxorubicin Liposomal injection What is this medicine? LIPOSOMAL DOXORUBICIN (LIP oh som al dox oh ROO bi sin) is a chemotherapy drug. This medicine is used to treat many kinds of cancer like Kaposi's sarcoma, multiple myeloma, and ovarian cancer. This medicine may be used for other purposes; ask your health care provider or pharmacist if you have questions. COMMON BRAND NAME(S): Doxil, Lipodox What should I tell my health care provider before I take this medicine? They need to know if you have any of these conditions:  blood disorders  heart disease  infection (especially a virus infection such as chickenpox, cold sores, or herpes)  liver disease  recent or ongoing radiation therapy  an unusual or allergic reaction to doxorubicin, other chemotherapy agents, soybeans, other medicines, foods, dyes, or preservatives  pregnant or trying to get pregnant  breast-feeding How should I use this medicine? This drug is given  as an infusion into a vein. It is administered in a hospital or clinic by a specially trained health care professional. If you have pain, swelling, burning or any unusual feeling around the site of your injection, tell your health care professional right away. Talk to your pediatrician regarding the use of this medicine in children. Special care may be needed. Overdosage: If you think you have taken too much of this medicine contact a poison control center or emergency room at once. NOTE: This medicine is only for you. Do not share this medicine with others. What if I miss a dose? It is important not to miss your dose. Call  your doctor or health care professional if you are unable to keep an appointment. What may interact with this medicine? Do not take this medicine with any of the following medications:  zidovudine This medicine may also interact with the following medications:  medicines to increase blood counts like filgrastim, pegfilgrastim, sargramostim  vaccines Talk to your doctor or health care professional before taking any of these medicines:  acetaminophen  aspirin  ibuprofen  ketoprofen  naproxen This list may not describe all possible interactions. Give your health care provider a list of all the medicines, herbs, non-prescription drugs, or dietary supplements you use. Also tell them if you smoke, drink alcohol, or use illegal drugs. Some items may interact with your medicine. What should I watch for while using this medicine? Your condition will be monitored carefully while you are receiving this medicine. You may need blood work done while you are taking this medicine. This drug may make you feel generally unwell. This is not uncommon, as chemotherapy can affect healthy cells as well as cancer cells. Report any side effects. Continue your course of treatment even though you feel ill unless your doctor tells you to stop. Your urine may turn orange-red for a few days after your dose. This is not blood. If your urine is dark or brown, call your doctor. In some cases, you may be given additional medicines to help with side effects. Follow all directions for their use. Talk to your doctor about your risk of cancer. You may be more at risk for certain types of cancers if you take this medicine. Do not become pregnant while taking this medicine or for 6 months after stopping it. Women should inform their healthcare professional if they wish to become pregnant or think they may be pregnant. Men should not father a child while taking this medicine and for 6 months after stopping it. There is a  potential for serious side effects to an unborn child. Talk to your health care professional or pharmacist for more information. Do not breast-feed an infant while taking this medicine. This medicine has caused ovarian failure in some women. This medicine may make it more difficult to get pregnant. Talk to your healthcare professional if you are concerned about your fertility. This medicine has caused decreased sperm counts in some men. This may make it more difficult to father a child. Talk to your healthcare professional if you are concerned about your fertility. This medicine may cause a decrease in Co-Enzyme Q-10. You should make sure that you get enough Co-Enzyme Q-10 while you are taking this medicine. Discuss the foods you eat and the vitamins you take with your health care professional. What side effects may I notice from receiving this medicine? Side effects that you should report to your doctor or health care professional as soon as possible:  allergic  reactions like skin rash, itching or hives, swelling of the face, lips, or tongue  low blood counts - this medicine may decrease the number of white blood cells, red blood cells and platelets. You may be at increased risk for infections and bleeding.  signs of hand-foot syndrome - tingling or burning, redness, flaking, swelling, small blisters, or small sores on the palms of your hands or the soles of your feet  signs of infection - fever or chills, cough, sore throat, pain or difficulty passing urine  signs of decreased platelets or bleeding - bruising, pinpoint red spots on the skin, black, tarry stools, blood in the urine  signs of decreased red blood cells - unusually weak or tired, fainting spells, lightheadedness  back pain, chills, facial flushing, fever, headache, tightness in the chest or throat during the infusion  breathing problems  chest pain  fast, irregular heartbeat  mouth pain, redness, sores  pain, swelling,  redness at site where injected  pain, tingling, numbness in the hands or feet  swelling of ankles, feet, or hands  vomiting Side effects that usually do not require medical attention (report to your doctor or health care professional if they continue or are bothersome):  diarrhea  hair loss  loss of appetite  nail discoloration or damage  nausea  red or watery eyes  red colored urine  stomach upset This list may not describe all possible side effects. Call your doctor for medical advice about side effects. You may report side effects to FDA at 1-800-FDA-1088. Where should I keep my medicine? This drug is given in a hospital or clinic and will not be stored at home. NOTE: This sheet is a summary. It may not cover all possible information. If you have questions about this medicine, talk to your doctor, pharmacist, or health care provider.  2020 Elsevier/Gold Standard (2017-09-04 15:13:26)   Coronavirus (COVID-19) Are you at risk?  Are you at risk for the Coronavirus (COVID-19)?  To be considered HIGH RISK for Coronavirus (COVID-19), you have to meet the following criteria:  . Traveled to [place], [place], [place], [place] or [place]; or in the [place] to [place], [place], [place], or [place]; and have fever, cough, and shortness of breath within the last 2 weeks of travel OR . Been in close contact with a person diagnosed with COVID-19 within the last 2 weeks and have fever, cough, and shortness of breath . IF YOU DO NOT MEET THESE CRITERIA, YOU ARE CONSIDERED LOW RISK FOR COVID-19.  What to do if you are HIGH RISK for COVID-19?  Marland Kitchen If you are having a medical emergency, call 911. . Seek medical care right away. Before you go to a doctor's office, urgent care or emergency department, call ahead and tell them about your recent travel, contact with someone diagnosed with COVID-19, and your symptoms. You should receive instructions from your physician's office  regarding next steps of care.  . When you arrive at healthcare provider, tell the healthcare staff immediately you have returned from visiting [place], [place], [place], [place] or [place]; or traveled in the [place] to [employer], [place], [place], or [place]; in the last two weeks or you have been in close contact with a person diagnosed with COVID-19 in the last 2 weeks.   . Tell the health care staff about your symptoms: fever, cough and shortness of breath. . After you have been seen by a medical provider, you will be either: o  Tested for (COVID-19) and discharged home on quarantine except to seek medical care if symptoms worsen, and asked to  - Stay home and avoid contact with others until you get your results (4-5 days)  - Avoid travel on public transportation if possible (such as bus, train, or airplane) or o Sent to the Emergency Department by EMS for evaluation, COVID-19 testing, and possible admission depending on your condition and test results.  What to do if you are LOW RISK for COVID-19?  Reduce your risk of any infection by using the same precautions used for avoiding the common cold or flu:  Marland Kitchen Wash your hands often with soap and warm water for at least 20 seconds.  If soap and water are not readily available, use an alcohol-based hand sanitizer with at least 60% alcohol.  . If coughing or sneezing, cover your mouth and nose by coughing or sneezing into the elbow areas of your shirt or coat, into a tissue or into your sleeve (not your hands). . Avoid shaking hands with others and consider head nods or verbal greetings only. . Avoid touching your eyes, nose, or mouth with unwashed hands.  . Avoid close contact with people who are sick. . Avoid places or events with large numbers of people in one location, like concerts or sporting events. . Carefully consider travel plans you have or are making. . If you are planning any travel outside or inside the [place], visit the CDC's  Travelers' Health webpage for the latest health notices. . If you have some symptoms but not all symptoms, continue to monitor at home and seek medical attention if your symptoms worsen. . If you are having a medical emergency, call 911.   [place] / e-Visit: [email or web address]         [facility]: [facility]: [phone]                   [facility]: [phone]

## 2018-08-13 NOTE — Patient Instructions (Signed)

## 2018-08-14 ENCOUNTER — Ambulatory Visit: Payer: Self-pay | Admitting: Pharmacist

## 2018-08-14 ENCOUNTER — Encounter: Payer: Self-pay | Admitting: Hematology and Oncology

## 2018-08-14 NOTE — Assessment & Plan Note (Signed)
Overall, she tolerated chemo well except for hypertension The patient appears to have significant difficulties of understanding the plan of care I have to explaine to the patient multiple times to reinforce teaching My plan would be minimum 3 cycles of chemotherapy before repeat imaging study I anticipate she will have worsening hypertension while on bevacizumab I will call her next week to check on her

## 2018-08-14 NOTE — Progress Notes (Signed)
[facility] OFFICE PROGRESS NOTE  Patient Care Team: Martinique, Allante G, MD as PCP - General (Family Medicine) Troy Sine, MD as PCP - Cardiology (Cardiology) Awanda Mink Craige Cotta, RN as Oncology Nurse Navigator (Oncology)  ASSESSMENT & PLAN:  Right ovarian epithelial cancer ([name]) Overall, she tolerated chemo well except for hypertension The patient appears to have significant difficulties of understanding the plan of care I have to explaine to the patient multiple times to reinforce teaching My plan would be minimum 3 cycles of chemotherapy before repeat imaging study I anticipate she will have worsening hypertension while on bevacizumab I will call her next week to check on her  Essential hypertension, benign She has worsening blood pressure control She has small petechial hemorrhages at the anterior shin area likely secondary to bevacizumab We discussed the importance of aggressive blood pressure control and management I recommend her to continue taking metoprolol in the morning with new prescription of hydrochlorothiazide She is instructed to take amlodipine in the evening She is instructed to call me if blood pressure is greater than 045 systolic or if diastolic is greater than 90 I will call her next week to check on her blood pressure control at home   No orders of the defined types were placed in this encounter.   INTERVAL HISTORY: Please see below for problem oriented charting. She returns for infusion and follow-up The patient ruminates over several different things According to her, she attempted to call my office several times but was unable to get hold of anybody She showed me documentation of her blood pressure over the last week She has several blood pressure with systolic in the 409W and 119J She has noticed some petechiae hemorrhages on both shins approximately 5 to 6 days ago She denies other bleeding Denies mouth sores, nausea or changes in bowel  habits No recent cough, chest pain or shortness of breath  SUMMARY OF ONCOLOGIC HISTORY: Oncology History Overview Note  Neg for genetics blood work and HRD   Right ovarian epithelial cancer ([place])  01/06/2018 Imaging   Ct abdomen and pelvis 1. Complex cystic mass at the right adnexa, measuring 5.9 x 4.0 cm, with nodular components, concerning for primary ovarian malignancy. 2. Diffuse nodularity along the omentum at the left side of the abdomen, extending into the mesentery at the left mid abdomen, concerning for peritoneal carcinomatosis. 3. Wall thickening at the distal ileum adjacent to the ovarian mass; bowel loops appear somewhat adherent to the ovarian mass. Bowel infiltration with tumor cannot be excluded. No evidence of bowel obstruction at this time. 4. Small volume ascites within the abdomen and pelvis.  Aortic Atherosclerosis (ICD10-I70.0).   01/08/2018 Tumor Marker   Patient's tumor was tested for the following markers: CA-125. Results of the tumor marker test revealed 3004   01/15/2018 Imaging   Chest CT:  1. No active cardiopulmonary disease. 2. Aortic atherosclerosis without aneurysm or dissection. 3. No large central pulmonary embolus.  CT AP:  1. Dilated fluid-filled loops of small bowel are redemonstrated slightly more extensive than on prior exam with transition point likely in the right adnexa adjacent to a complex cystic mass concerning for ovarian neoplasm given septations and soft tissue nodularity. This raises concern for early or partial SBO. This soft tissue mass measures 4.9 x 4 x 4.7 cm and has not changed since prior recent comparison. Additional short segmental area of luminal narrowing is noted in the right lower quadrant involving small bowel for which stigmata of  peritoneal carcinomatosis or small-bowel metastatic implants might account for this. 2. Redemonstration of small volume of ascites predominantly in the upper abdomen surrounding the liver and  spleen. 3. Redemonstration of thick bandlike omental thickening concerning for peritoneal carcinomatosis.    01/15/2018 Procedure   Successful ultrasound-guided diagnostic and therapeutic paracentesis yielding 1.5 liters of peritoneal fluid.    01/15/2018 Pathology Results   PERITONEAL/ASCITIC FLUID (SPECIMEN 1 OF 1 COLLECTED 01/18/18): MALIGNANT CELLS CONSISTENT WITH METASTATIC ADENOCARCINOMA. SEE COMMENT. COMMENT: THE MALIGNANT CELLS ARE POSITIVE FOR MOC-31, CYTOKERATIN 7, ESTROGEN RECEPTOR, PAX-8, AND WT-1. THEY ARE NEGATIVE FOR CALRETININ, CYTOKERATIN 5/6, AND CYTOKERATIN 20. THE PROFILE IS CONSISTENT WITH A PRIMARY GYNECOLOGIC CARCINOMA. THERE IS LIKELY SUFFICIENT TUMOR PRESENT, IF ADDITIONAL STUDIES ARE REQUESTED.   01/15/2018 - 02/02/2018 Hospital Admission   She was admitted to the hospital for SBO. She was treated with chemotherapy   01/18/2018 - 06/20/2018 Chemotherapy   The patient had carboplatin and taxol   02/08/2018 Cancer Staging   Staging form: Ovary, Fallopian Tube, and Primary Peritoneal Carcinoma, AJCC 8th Edition - Clinical: cT3, cN0, cM0 - Signed by Heath Lark, MD on 02/08/2018   02/08/2018 Tumor Marker   Patient's tumor was tested for the following markers: CA-125. Results of the tumor marker test revealed 445   03/02/2018 Tumor Marker   Patient's tumor was tested for the following markers: CA-125. Results of the tumor marker test revealed 174   03/15/2018 Imaging   6.2 cm complex cystic right ovarian mass, compatible with malignant ovarian neoplasm, mildly progressive.  Associated peritoneal disease/omental caking beneath the anterior abdominal wall, mildly improved. Prior abdominal ascites is improved/resolved.  4.7 cm cystic left ovarian mass, without overt malignant features, grossly unchanged.  Mild bilateral hydroureteronephrosis, secondary to extrinsic compression, new.  Prior small bowel obstruction has improved/resolved.    03/27/2018 Pathology  Results   1. Omentum, resection for tumor - OMENTUM: - HIGH GRADE SEROUS CARCINOMA. 2. Ovary, left - LEFT OVARY: - HIGH GRADE SEROUS CARCINOMA. - LEFT FALLOPIAN TUBE: - HIGH GRADE SEROUS CARCINOMA. 3. Adnexa - ovary +/- tube, neoplastic, right - RIGHT OVARY: - HIGH GRADE SEROUS CARCINOMA, 5.5 CM. - RIGHT FALLOPIAN TUBE: - HIGH GRADE SEROUS CARCINOMA. 4. Peritoneum, biopsy, nodule - PERITONEUM: - NODULES OF HIGH GRADE SEROUS CARCINOMA. 5. Peritoneum, resection for tumor, rectosigmoid - RECTOSIGMOID PERITONEUM: - HIGH GRADE SEROUS CARCINOMA. Microscopic Comment 3. OVARY or FALLOPIAN TUBE or PRIMARY PERITONEUM: Procedure: Bilateral salpingo-oophorectomy, omentectomy and peritoneal biopsies. Specimen Integrity: N/A. Tumor Site: Right ovary. Ovarian Surface Involvement (required only if applicable): Yes. Fallopian Tube Surface Involvement (required only if applicable): Yes. Tumor Size: 5.5 x 4.8 x 4.0 cm. Histologic Type: Serous carcinoma. Histologic Grade: High grade. Implants (required for advanced stage serous/seromucinous borderline tumors only): Omentum, left ovary and fallopian tube, rectosigmoid peritoneum and peritoneum. Other Tissue/ Organ Involvement: Left ovary and fallopian tube, omentum and rectosigmoid peritoneum. Largest Extrapelvic Peritoneal Focus (required only if applicable): 24.2 cm, omentum. Peritoneal/Ascitic Fluid: N/A. Treatment Effect (required only for high-grade serous carcinomas): Minimal. Regional Lymph Nodes: No lymph nodes submitted. Pathologic Stage Classification (pTNM, AJCC 8th Edition): pT3c, pNX. Representative Tumor Block: 3A, 3B, 3D, 3E and 85F. Comment(s): There is a 5.5 cm in greatest dimension partially cystic high grade serous carcinoma involving the right adnexal specimen and the tumor is staged as a primary right ovarian carcinoma. The carcinoma also involves the left ovary as well as the left fallopian tube, the omentum, peritoneum and the  rectosigmoid peritoneal specimens.   03/27/2018 Surgery  Preoperative Diagnosis: ovarian cancer, stage IIIC, metastatic to omentum, peritoneum, serosa of intestines   Procedure(s) Performed: 1. Exploratory laparotomy with bilateral salpingo-oophorectomy, omentectomy radical tumor debulking for ovarian cancer .  Surgeon: Thereasa Solo, MD.   Specimens: Bilateral tubes / ovaries, omentum. Peritoneal nodules, rectosigmoid nodules.    Operative Findings: omental cake, miliary studding on diaphragm and bilateral paracolic gutters. Sigmoid colon densely adherent to left and right ovaries with tumor rind, ureters mildly dilated bilaterally due to retroperitoneal extension of the tumor towards ureters causing compression.    This represented an optimal cytoreduction (R1) with gross residual disease on the diaphragm that had been ablated and a thin tumor plaque on the sigmoid colon that was ablated. No residual disease >1cm remaining   05/07/2018 Genetic Testing   Negative genetic testing on the [facility] TumorNextHRD+ CancerNext Panel. The CancerNext gene panel offered by [employer] includes sequencing and rearrangement analysis for the following 34 genes:   APC, ATM, BARD1, BMPR1A, BRCA1, BRCA2, BRIP1, CDH1, CDK4, CDKN2A, CHEK2, DICER1, EPCAM, GREM1, HOXB13, MLH1, MRE11A, MSH2, MSH6, MUTYH, NBN, NF1, PALB2, PMS2, POLD1, POLE, PTEN, RAD50, RAD51C, RAD51D, SMAD4, SMARCA4, STK11, and TP53. Somatic genes analyzed through TumorNext-HRD: ATM, BARD1, BRCA1, BRCA2, BRIP1, CHEK2, MRE11A, NBN, PALB2, RAD51C, RAD51D. The report date is 05/07/2018.   05/11/2018 Tumor Marker   Patient's tumor was tested for the following markers: CA-125. Results of the tumor marker test revealed 60.5   07/02/2018 Imaging   1. Mild right pericardiophrenic adenopathy is mildly increased, suspicious for metastatic nodes. No abdominopelvic adenopathy. 2. Small left peritoneal soft tissue nodule adjacent to the splenic flexure and  smooth left pelvic sidewall peritoneal thickening, cannot exclude residual/recurrent disease. Attention on follow-up CT advised. 3. Bilateral renal collecting system dilatation has improved. 4.  Aortic Atherosclerosis (ICD10-I70.0).   07/02/2018 Tumor Marker   Patient's tumor was tested for the following markers: CA-125 Results of the tumor marker test revealed 42.7   07/11/2018 Echocardiogram   1. The left ventricle has normal systolic function with an ejection fraction of 60-65%. The cavity size was normal. Left ventricular diastolic parameters were normal.  2. Normal GLS -20.1.  3. The right ventricle has normal systolic function. The cavity was normal. There is no increase in right ventricular wall thickness.  4. The mitral valve is degenerative. Moderate thickening of the mitral valve leaflet. Moderate calcification of the mitral valve leaflet.  5. The aortic valve is tricuspid. Moderate thickening of the aortic valve. Moderate calcification of the aortic valve. Aortic valve regurgitation is trivial by color flow Doppler.  6. The aortic root is normal in size and structure.    Chemotherapy   The patient had doxorubicin and bevacizumab for chemotherapy treatment.     07/30/2018 Tumor Marker   Patient's tumor was tested for the following markers: CA-125 Results of the tumor marker test revealed 82.6     REVIEW OF SYSTEMS:   Constitutional: Denies fevers, chills or abnormal weight loss Eyes: Denies blurriness of vision Ears, nose, mouth, throat, and face: Denies mucositis or sore throat Respiratory: Denies cough, dyspnea or wheezes Cardiovascular: Denies palpitation, chest discomfort or lower extremity swelling Gastrointestinal:  Denies nausea, heartburn or change in bowel habits Skin: Denies abnormal skin rashes Lymphatics: Denies new lymphadenopathy or easy bruising Neurological:Denies numbness, tingling or new weaknesses Behavioral/Psych: Mood is stable, no new changes  All  other systems were reviewed with the patient and are negative.  I have reviewed the past medical history, past surgical history, social history and  family history with the patient and they are unchanged from previous note.  ALLERGIES:  is allergic to tramadol hcl.  MEDICATIONS:  Current Outpatient Medications  Medication Sig Dispense Refill  . acetaminophen (TYLENOL) 500 MG tablet Take 2 tablets (1,000 mg total) by mouth every 6 (six) hours. (Patient taking differently: Take 1,000 mg by mouth every 6 (six) hours as needed. ) 30 tablet 0  . ALPRAZolam (XANAX) 0.5 MG tablet Take 0.5-1 tablets (0.25-0.5 mg total) by mouth daily as needed for anxiety. 30 tablet 2  . alum & mag hydroxide-simeth (MAALOX/MYLANTA) 200-200-20 MG/5ML suspension Take 30 mLs by mouth every 6 (six) hours as needed for indigestion or heartburn. 355 mL 0  . amLODipine (NORVASC) 10 MG tablet Take 1 tablet (10 mg total) by mouth daily. 90 tablet 3  . Calcium Carbonate (CALTRATE 600) 1500 MG TABS Take 600 mg of elemental calcium by mouth daily.     . cholecalciferol (VITAMIN D) 1000 UNITS tablet Take 1,000 Units by mouth daily.     . fluticasone (FLONASE) 50 MCG/ACT nasal spray Use 2 sprays in each nostril daily (Patient taking differently: Place 2 sprays into both nostrils daily. ) 48 g 2  . hydrochlorothiazide (HYDRODIURIL) 25 MG tablet Take 1 tablet (25 mg total) by mouth daily. 30 tablet 1  . ibuprofen (ADVIL,MOTRIN) 200 MG tablet Take 600 mg by mouth every 6 (six) hours as needed for moderate pain.     Marland Kitchen lactose free nutrition (BOOST PLUS) LIQD Take 237 mLs by mouth daily. (Patient taking differently: Take 237 mLs by mouth 2 (two) times daily between meals. ) 10 Can 0  . levothyroxine (SYNTHROID, LEVOTHROID) 75 MCG tablet Take 1 tablet (75 mcg total) by mouth daily. (Patient taking differently: Take 75 mcg by mouth daily before breakfast. ) 90 tablet 3  . lidocaine-prilocaine (EMLA) cream Apply 1 application topically as  needed. 30 g 6  . lidocaine-prilocaine (EMLA) cream Apply to affected area once 30 g 3  . Magnesium 400 MG TABS Take 250 mg by mouth daily.    . Menthol-Methyl Salicylate (MUSCLE RUB) 10-15 % CREA Apply 1 application topically as needed for muscle pain.    . metoprolol succinate (TOPROL-XL) 50 MG 24 hr tablet Take 1 tablet (50 mg total) by mouth daily. Take with or immediately following a meal. 90 tablet 3  . Multiple Vitamins-Minerals (MULTIVITAMIN WITH MINERALS) tablet Take 1 tablet by mouth daily.      . Omega-3 Fatty Acids (FISH OIL) 1000 MG CAPS Take 2,000 mg by mouth 2 (two) times daily.     Marland Kitchen omeprazole (PRILOSEC) 40 MG capsule Take 1 capsule (40 mg total) by mouth daily. 90 capsule 3  . ondansetron (ZOFRAN) 8 MG tablet Take 1 tablet (8 mg total) by mouth every 8 (eight) hours as needed for nausea. 90 tablet 3  . ondansetron (ZOFRAN) 8 MG tablet Take 1 tablet (8 mg total) by mouth every 8 (eight) hours as needed (Nausea or vomiting). 30 tablet 1  . oxyCODONE (OXY IR/ROXICODONE) 5 MG immediate release tablet Take 1 tablet (5 mg total) by mouth every 6 (six) hours as needed for severe pain. 30 tablet 0  . polyethylene glycol (MIRALAX / GLYCOLAX) packet Take 17 g by mouth daily. (Patient not taking: Reported on 06/08/2018) 14 each 0  . prochlorperazine (COMPAZINE) 10 MG tablet Take 1 tablet (10 mg total) by mouth every 6 (six) hours as needed for nausea or vomiting. (Patient not taking: Reported on 05/02/2018) 60  tablet 11  . prochlorperazine (COMPAZINE) 10 MG tablet Take 1 tablet (10 mg total) by mouth every 6 (six) hours as needed (Nausea or vomiting). 30 tablet 1  . rivaroxaban (XARELTO) 20 MG TABS tablet Take 1 tablet (20 mg total) by mouth daily with supper. 14 tablet 0  . senna (SENOKOT) 8.6 MG TABS tablet Take 1 tablet (8.6 mg total) by mouth at bedtime as needed for mild constipation. 120 each 0  . senna-docusate (SENOKOT-S) 8.6-50 MG tablet Take 2 tablets by mouth at bedtime. For AFTER  surgery 30 tablet 0  . simvastatin (ZOCOR) 20 MG tablet Take 1 tablet (20 mg total) by mouth at bedtime. 90 tablet 3  . vitamin C (ASCORBIC ACID) 500 MG tablet Take 1,000 mg by mouth daily.     No current facility-administered medications for this visit.     PHYSICAL EXAMINATION: ECOG PERFORMANCE STATUS: 2 - Symptomatic, <50% confined to bed  Vitals:   08/13/18 1157  BP: (!) 148/72  Pulse: 63  Resp: 18  Temp: 98 F (36.7 C)  SpO2: 100%   Filed Weights   08/13/18 1157  Weight: 136 lb (61.7 kg)    GENERAL:alert, no distress and comfortable SKIN: Noted petechial hemorrhages on both shins EYES: normal, Conjunctiva are pink and non-injected, sclera clear OROPHARYNX:no exudate, no erythema and lips, buccal mucosa, and tongue normal  NECK: supple, thyroid normal size, non-tender, without nodularity LYMPH:  no palpable lymphadenopathy in the cervical, axillary or inguinal LUNGS: clear to auscultation and percussion with normal breathing effort HEART: regular rate & rhythm and no murmurs noted mild bilateral lower extremity edema ABDOMEN:abdomen soft, non-tender and normal bowel sounds Musculoskeletal:no cyanosis of digits and no clubbing  NEURO: alert & oriented x 3 with fluent speech, no focal motor/sensory deficits  LABORATORY DATA:  I have reviewed the data as listed    Component Value Date/Time   NA 141 08/13/2018 1110   K 3.8 08/13/2018 1110   CL 108 08/13/2018 1110   CO2 25 08/13/2018 1110   GLUCOSE 80 08/13/2018 1110   BUN 10 08/13/2018 1110   CREATININE 0.69 08/13/2018 1110   CREATININE 0.76 11/17/2015 1005   CALCIUM 9.1 08/13/2018 1110   PROT 6.9 08/13/2018 1110   ALBUMIN 3.6 08/13/2018 1110   AST 15 08/13/2018 1110   ALT 13 08/13/2018 1110   ALKPHOS 78 08/13/2018 1110   BILITOT 0.5 08/13/2018 1110   GFRNONAA >60 08/13/2018 1110   GFRAA >60 08/13/2018 1110    No results found for: SPEP, UPEP  Lab Results  Component Value Date   WBC 5.3 08/13/2018    NEUTROABS 2.4 08/13/2018   HGB 12.7 08/13/2018   HCT 39.7 08/13/2018   MCV 96.1 08/13/2018   PLT 144 (L) 08/13/2018      Chemistry      Component Value Date/Time   NA 141 08/13/2018 1110   K 3.8 08/13/2018 1110   CL 108 08/13/2018 1110   CO2 25 08/13/2018 1110   BUN 10 08/13/2018 1110   CREATININE 0.69 08/13/2018 1110   CREATININE 0.76 11/17/2015 1005      Component Value Date/Time   CALCIUM 9.1 08/13/2018 1110   ALKPHOS 78 08/13/2018 1110   AST 15 08/13/2018 1110   ALT 13 08/13/2018 1110   BILITOT 0.5 08/13/2018 1110      All questions were answered. The patient knows to call the clinic with any problems, questions or concerns. No barriers to learning was detected.  I spent 25 minutes  counseling the patient face to face. The total time spent in the appointment was 30 minutes and more than 50% was on counseling and review of test results  Heath Lark, MD 08/14/2018 9:39 AM

## 2018-08-14 NOTE — Assessment & Plan Note (Signed)
She has worsening blood pressure control She has small petechial hemorrhages at the anterior shin area likely secondary to bevacizumab We discussed the importance of aggressive blood pressure control and management I recommend her to continue taking metoprolol in the morning with new prescription of hydrochlorothiazide She is instructed to take amlodipine in the evening She is instructed to call me if blood pressure is greater than 706 systolic or if diastolic is greater than 90 I will call her next week to check on her blood pressure control at home

## 2018-08-14 NOTE — Telephone Encounter (Signed)
Noted. Thanks.

## 2018-08-20 ENCOUNTER — Telehealth: Payer: Self-pay | Admitting: *Deleted

## 2018-08-20 NOTE — Telephone Encounter (Signed)
-----   Message from Heath Lark, MD sent at 08/20/2018  8:39 AM EDT ----- Regarding: BP Can you call and ask how her BP is doing?

## 2018-08-20 NOTE — Telephone Encounter (Signed)
Patient reports her blood pressure has gradually decreased. She reports her bp this morning was 135/84.   116/79 last night 114/75 Saturday 123/80 Friday  Patient reports she has been feeling great. She will contact us with any questions or concerns.

## 2018-08-21 ENCOUNTER — Ambulatory Visit: Payer: Self-pay | Admitting: Pharmacist

## 2018-08-24 ENCOUNTER — Other Ambulatory Visit: Payer: Self-pay | Admitting: *Deleted

## 2018-08-24 MED ORDER — LEVOTHYROXINE SODIUM 75 MCG PO TABS: 75.0000 ug | ORAL_TABLET | Freq: Every day | ORAL | 3 refills | Status: DC

## 2018-08-27 ENCOUNTER — Other Ambulatory Visit: Payer: Self-pay

## 2018-08-27 ENCOUNTER — Inpatient Hospital Stay (HOSPITAL_BASED_OUTPATIENT_CLINIC_OR_DEPARTMENT_OTHER): Payer: PPO | Admitting: Hematology and Oncology

## 2018-08-27 ENCOUNTER — Inpatient Hospital Stay: Payer: PPO

## 2018-08-27 ENCOUNTER — Ambulatory Visit: Payer: Self-pay | Admitting: Pharmacist

## 2018-08-27 VITALS — BP 121/72 | HR 49

## 2018-08-27 DIAGNOSIS — C569 Malignant neoplasm of unspecified ovary: Secondary | ICD-10-CM

## 2018-08-27 DIAGNOSIS — C561 Malignant neoplasm of right ovary: Secondary | ICD-10-CM

## 2018-08-27 DIAGNOSIS — I10 Essential (primary) hypertension: Secondary | ICD-10-CM | POA: Diagnosis not present

## 2018-08-27 DIAGNOSIS — Z7189 Other specified counseling: Secondary | ICD-10-CM

## 2018-08-27 DIAGNOSIS — Z5112 Encounter for antineoplastic immunotherapy: Secondary | ICD-10-CM | POA: Diagnosis not present

## 2018-08-27 DIAGNOSIS — D61818 Other pancytopenia: Secondary | ICD-10-CM | POA: Diagnosis not present

## 2018-08-27 DIAGNOSIS — M79606 Pain in leg, unspecified: Secondary | ICD-10-CM

## 2018-08-27 LAB — CBC WITH DIFFERENTIAL (CANCER CENTER ONLY)
Abs Immature Granulocytes: 0.02 10*3/uL (ref 0.00–0.07)
Basophils Absolute: 0 10*3/uL (ref 0.0–0.1)
Basophils Relative: 1 %
Eosinophils Absolute: 0.4 10*3/uL (ref 0.0–0.5)
Eosinophils Relative: 7 %
HCT: 37.3 % (ref 36.0–46.0)
Hemoglobin: 12.1 g/dL (ref 12.0–15.0)
Immature Granulocytes: 0 %
Lymphocytes Relative: 26 %
Lymphs Abs: 1.4 10*3/uL (ref 0.7–4.0)
MCH: 30.5 pg (ref 26.0–34.0)
MCHC: 32.4 g/dL (ref 30.0–36.0)
MCV: 94 fL (ref 80.0–100.0)
Monocytes Absolute: 0.4 10*3/uL (ref 0.1–1.0)
Monocytes Relative: 7 %
Neutro Abs: 3.1 10*3/uL (ref 1.7–7.7)
Neutrophils Relative %: 59 %
Platelet Count: 125 10*3/uL — ABNORMAL LOW (ref 150–400)
RBC: 3.97 MIL/uL (ref 3.87–5.11)
RDW: 14 % (ref 11.5–15.5)
WBC Count: 5.2 10*3/uL (ref 4.0–10.5)
nRBC: 0 % (ref 0.0–0.2)

## 2018-08-27 LAB — CMP (CANCER CENTER ONLY)
ALT: 13 U/L (ref 0–44)
AST: 14 U/L — ABNORMAL LOW (ref 15–41)
Albumin: 3.4 g/dL — ABNORMAL LOW (ref 3.5–5.0)
Alkaline Phosphatase: 72 U/L (ref 38–126)
Anion gap: 8 (ref 5–15)
BUN: 9 mg/dL (ref 8–23)
CO2: 27 mmol/L (ref 22–32)
Calcium: 9.3 mg/dL (ref 8.9–10.3)
Chloride: 105 mmol/L (ref 98–111)
Creatinine: 0.68 mg/dL (ref 0.44–1.00)
GFR, Est AFR Am: >60 mL/min (ref >60)
GFR, Estimated: >60 mL/min (ref >60)
Glucose, Bld: 68 mg/dL — ABNORMAL LOW (ref 70–99)
Potassium: 3.5 mmol/L (ref 3.5–5.1)
Sodium: 140 mmol/L (ref 135–145)
Total Bilirubin: 0.5 mg/dL (ref 0.3–1.2)
Total Protein: 6.7 g/dL (ref 6.5–8.1)

## 2018-08-27 LAB — TOTAL PROTEIN, URINE DIPSTICK: Protein, ur: NEGATIVE mg/dL

## 2018-08-27 MED ORDER — SODIUM CHLORIDE 0.9 % IV SOLN
10.0000 mg/kg | Freq: Once | INTRAVENOUS | Status: AC
  Administered 2018-08-27: 600 mg via INTRAVENOUS
  Filled 2018-08-27: qty 16

## 2018-08-27 MED ORDER — HEPARIN SOD (PORK) LOCK FLUSH 100 UNIT/ML IV SOLN
500.0000 [IU] | Freq: Once | INTRAVENOUS | Status: AC | PRN
  Administered 2018-08-27: 500 [IU]
  Filled 2018-08-27: qty 5

## 2018-08-27 MED ORDER — SODIUM CHLORIDE 0.9% FLUSH
10.0000 mL | Freq: Once | INTRAVENOUS | Status: AC
  Administered 2018-08-27: 10 mL
  Filled 2018-08-27: qty 10

## 2018-08-27 MED ORDER — SODIUM CHLORIDE 0.9% FLUSH
10.0000 mL | INTRAVENOUS | Status: DC | PRN
  Administered 2018-08-27: 10 mL
  Filled 2018-08-27: qty 10

## 2018-08-27 MED ORDER — HYDROCHLOROTHIAZIDE 25 MG PO TABS: 25.0000 mg | ORAL_TABLET | Freq: Every day | ORAL | 1 refills | Status: DC

## 2018-08-27 MED ORDER — SODIUM CHLORIDE 0.9 % IV SOLN
Freq: Once | INTRAVENOUS | Status: AC
  Administered 2018-08-27: 12:00:00 via INTRAVENOUS
  Filled 2018-08-27: qty 250

## 2018-08-27 NOTE — Patient Instructions (Signed)
[facility] Discharge Instructions for Patients Receiving Chemotherapy  Today you received the following chemotherapy agent: Avastin.  To help prevent nausea and vomiting after your treatment, we encourage you to take your nausea medication as prescribed.  Do  Drink lots of fluids  As  Tolerated.  Do Not  Eat  Greasy  Nor  Spicy  Foods.   If you develop nausea and vomiting that is not controlled by your nausea medication, call the clinic.   BELOW ARE SYMPTOMS THAT SHOULD BE REPORTED IMMEDIATELY:  *FEVER GREATER THAN 100.5 F  *CHILLS WITH OR WITHOUT FEVER  NAUSEA AND VOMITING THAT IS NOT CONTROLLED WITH YOUR NAUSEA MEDICATION  *UNUSUAL SHORTNESS OF BREATH  *UNUSUAL BRUISING OR BLEEDING  TENDERNESS IN MOUTH AND THROAT WITH OR WITHOUT PRESENCE OF ULCERS  *URINARY PROBLEMS  *BOWEL PROBLEMS  UNUSUAL RASH Items with * indicate a potential emergency and should be followed up as soon as possible.  Feel free to call the clinic should you have any questions or concerns. The clinic phone number is [phone].  Please show the [facility] at check-in to the Emergency Department and triage nurse.

## 2018-08-28 ENCOUNTER — Encounter: Payer: Self-pay | Admitting: Hematology and Oncology

## 2018-08-28 LAB — CA 125: Cancer Antigen (CA) 125: 111 U/mL — ABNORMAL HIGH (ref 0.0–38.1)

## 2018-08-28 NOTE — Assessment & Plan Note (Signed)
She has mild thrombocytopenia likely due to treatment She is not symptomatic Observe only There is no contraindication for her to proceed with anticoagulation therapy

## 2018-08-28 NOTE — Assessment & Plan Note (Signed)
From the chemotherapy standpoint, she denies side effects such as nausea, chest pain or shortness of breath From the bevacizumab standpoint, her blood pressure is well controlled The petechiae changes seen on her shin is likely due to bevacizumab I reassured the patient that it is not causing any harm We will continue to follow-up on tumor marker even though is elevated, due to lack of symptoms, I would still prefer to delay imaging study until next month

## 2018-08-28 NOTE — Progress Notes (Signed)
[place] OFFICE PROGRESS NOTE  Patient Care Team: [place], Elisabet G, MD as PCP - General (Family Medicine) Troy Sine, MD as PCP - Cardiology (Cardiology) Awanda Mink Craige Cotta, RN as Oncology Nurse Navigator (Oncology)  ASSESSMENT & PLAN:  Right ovarian epithelial cancer [facility]) From the chemotherapy standpoint, she denies side effects such as nausea, chest pain or shortness of breath From the bevacizumab standpoint, her blood pressure is well controlled The petechiae changes seen on her shin is likely due to bevacizumab I reassured the patient that it is not causing any harm We will continue to follow-up on tumor marker even though is elevated, due to lack of symptoms, I would still prefer to delay imaging study until next month  Essential hypertension, benign She has stable blood pressure control She has small petechial hemorrhages at the anterior shin area likely secondary to bevacizumab We discussed the importance of aggressive blood pressure control and management I recommend her to continue taking metoprolol in the morning with new prescription of hydrochlorothiazide She is instructed to take amlodipine in the evening She is instructed to call me if blood pressure is greater than 768 systolic or if diastolic is greater than 90  Pancytopenia, acquired ([place]) She has mild thrombocytopenia likely due to treatment She is not symptomatic Observe only There is no contraindication for her to proceed with anticoagulation therapy   No orders of the defined types were placed in this encounter.   INTERVAL HISTORY: Please see below for problem oriented charting. She returns for further follow-up and treatment Her blood pressure control last week was satisfactory She is still disturbed by the appearance of her shin area with petechial hemorrhages No recent nausea, vomiting, mucositis, chest pain or shortness of breath  SUMMARY OF ONCOLOGIC HISTORY: Oncology History  Overview Note  Neg for genetics blood work and HRD   Right ovarian epithelial cancer ([place])  01/06/2018 Imaging   Ct abdomen and pelvis 1. Complex cystic mass at the right adnexa, measuring 5.9 x 4.0 cm, with nodular components, concerning for primary ovarian malignancy. 2. Diffuse nodularity along the omentum at the left side of the abdomen, extending into the mesentery at the left mid abdomen, concerning for peritoneal carcinomatosis. 3. Wall thickening at the distal ileum adjacent to the ovarian mass; bowel loops appear somewhat adherent to the ovarian mass. Bowel infiltration with tumor cannot be excluded. No evidence of bowel obstruction at this time. 4. Small volume ascites within the abdomen and pelvis.  Aortic Atherosclerosis (ICD10-I70.0).   01/08/2018 Tumor Marker   Patient's tumor was tested for the following markers: CA-125. Results of the tumor marker test revealed 3004   01/15/2018 Imaging   Chest CT:  1. No active cardiopulmonary disease. 2. Aortic atherosclerosis without aneurysm or dissection. 3. No large central pulmonary embolus.  CT AP:  1. Dilated fluid-filled loops of small bowel are redemonstrated slightly more extensive than on prior exam with transition point likely in the right adnexa adjacent to a complex cystic mass concerning for ovarian neoplasm given septations and soft tissue nodularity. This raises concern for early or partial SBO. This soft tissue mass measures 4.9 x 4 x 4.7 cm and has not changed since prior recent comparison. Additional short segmental area of luminal narrowing is noted in the right lower quadrant involving small bowel for which stigmata of peritoneal carcinomatosis or small-bowel metastatic implants might account for this. 2. Redemonstration of small volume of ascites predominantly in the upper abdomen surrounding the liver and spleen.  3. Redemonstration of thick bandlike omental thickening concerning for peritoneal  carcinomatosis.    01/15/2018 Procedure   Successful ultrasound-guided diagnostic and therapeutic paracentesis yielding 1.5 liters of peritoneal fluid.    01/15/2018 Pathology Results   PERITONEAL/ASCITIC FLUID (SPECIMEN 1 OF 1 COLLECTED 01/18/18): MALIGNANT CELLS CONSISTENT WITH METASTATIC ADENOCARCINOMA. SEE COMMENT. COMMENT: THE MALIGNANT CELLS ARE POSITIVE FOR MOC-31, CYTOKERATIN 7, ESTROGEN RECEPTOR, PAX-8, AND WT-1. THEY ARE NEGATIVE FOR CALRETININ, CYTOKERATIN 5/6, AND CYTOKERATIN 20. THE PROFILE IS CONSISTENT WITH A PRIMARY GYNECOLOGIC CARCINOMA. THERE IS LIKELY SUFFICIENT TUMOR PRESENT, IF ADDITIONAL STUDIES ARE REQUESTED.   01/15/2018 - 02/02/2018 Hospital Admission   She was admitted to the hospital for SBO. She was treated with chemotherapy   01/18/2018 - 06/20/2018 Chemotherapy   The patient had carboplatin and taxol   02/08/2018 Cancer Staging   Staging form: Ovary, Fallopian Tube, and Primary Peritoneal Carcinoma, AJCC 8th Edition - Clinical: cT3, cN0, cM0 - Signed by Heath Lark, MD on 02/08/2018   02/08/2018 Tumor Marker   Patient's tumor was tested for the following markers: CA-125. Results of the tumor marker test revealed 445   03/02/2018 Tumor Marker   Patient's tumor was tested for the following markers: CA-125. Results of the tumor marker test revealed 174   03/15/2018 Imaging   6.2 cm complex cystic right ovarian mass, compatible with malignant ovarian neoplasm, mildly progressive.  Associated peritoneal disease/omental caking beneath the anterior abdominal wall, mildly improved. Prior abdominal ascites is improved/resolved.  4.7 cm cystic left ovarian mass, without overt malignant features, grossly unchanged.  Mild bilateral hydroureteronephrosis, secondary to extrinsic compression, new.  Prior small bowel obstruction has improved/resolved.    03/27/2018 Pathology Results   1. Omentum, resection for tumor - OMENTUM: - HIGH GRADE SEROUS CARCINOMA. 2. Ovary,  left - LEFT OVARY: - HIGH GRADE SEROUS CARCINOMA. - LEFT FALLOPIAN TUBE: - HIGH GRADE SEROUS CARCINOMA. 3. Adnexa - ovary +/- tube, neoplastic, right - RIGHT OVARY: - HIGH GRADE SEROUS CARCINOMA, 5.5 CM. - RIGHT FALLOPIAN TUBE: - HIGH GRADE SEROUS CARCINOMA. 4. Peritoneum, biopsy, nodule - PERITONEUM: - NODULES OF HIGH GRADE SEROUS CARCINOMA. 5. Peritoneum, resection for tumor, rectosigmoid - RECTOSIGMOID PERITONEUM: - HIGH GRADE SEROUS CARCINOMA. Microscopic Comment 3. OVARY or FALLOPIAN TUBE or PRIMARY PERITONEUM: Procedure: Bilateral salpingo-oophorectomy, omentectomy and peritoneal biopsies. Specimen Integrity: N/A. Tumor Site: Right ovary. Ovarian Surface Involvement (required only if applicable): Yes. Fallopian Tube Surface Involvement (required only if applicable): Yes. Tumor Size: 5.5 x 4.8 x 4.0 cm. Histologic Type: Serous carcinoma. Histologic Grade: High grade. Implants (required for advanced stage serous/seromucinous borderline tumors only): Omentum, left ovary and fallopian tube, rectosigmoid peritoneum and peritoneum. Other Tissue/ Organ Involvement: Left ovary and fallopian tube, omentum and rectosigmoid peritoneum. Largest Extrapelvic Peritoneal Focus (required only if applicable): 88.4 cm, omentum. Peritoneal/Ascitic Fluid: N/A. Treatment Effect (required only for high-grade serous carcinomas): Minimal. Regional Lymph Nodes: No lymph nodes submitted. Pathologic Stage Classification (pTNM, AJCC 8th Edition): pT3c, pNX. Representative Tumor Block: 3A, 3B, 3D, 3E and 38F. Comment(s): There is a 5.5 cm in greatest dimension partially cystic high grade serous carcinoma involving the right adnexal specimen and the tumor is staged as a primary right ovarian carcinoma. The carcinoma also involves the left ovary as well as the left fallopian tube, the omentum, peritoneum and the rectosigmoid peritoneal specimens.   03/27/2018 Surgery   Preoperative Diagnosis: ovarian  cancer, stage IIIC, metastatic to omentum, peritoneum, serosa of intestines   Procedure(s) Performed: 1. Exploratory laparotomy with bilateral salpingo-oophorectomy, omentectomy radical tumor  debulking for ovarian cancer .  Surgeon: Thereasa Solo, MD.   Specimens: Bilateral tubes / ovaries, omentum. Peritoneal nodules, rectosigmoid nodules.    Operative Findings: omental cake, miliary studding on diaphragm and bilateral paracolic gutters. Sigmoid colon densely adherent to left and right ovaries with tumor rind, ureters mildly dilated bilaterally due to retroperitoneal extension of the tumor towards ureters causing compression.    This represented an optimal cytoreduction (R1) with gross residual disease on the diaphragm that had been ablated and a thin tumor plaque on the sigmoid colon that was ablated. No residual disease >1cm remaining   05/07/2018 Genetic Testing   Negative genetic testing on the [employer] TumorNextHRD+ CancerNext Panel. The CancerNext gene panel offered by [employer] includes sequencing and rearrangement analysis for the following 34 genes:   APC, ATM, BARD1, BMPR1A, BRCA1, BRCA2, BRIP1, CDH1, CDK4, CDKN2A, CHEK2, DICER1, EPCAM, GREM1, HOXB13, MLH1, MRE11A, MSH2, MSH6, MUTYH, NBN, NF1, PALB2, PMS2, POLD1, POLE, PTEN, RAD50, RAD51C, RAD51D, SMAD4, SMARCA4, STK11, and TP53. Somatic genes analyzed through TumorNext-HRD: ATM, BARD1, BRCA1, BRCA2, BRIP1, CHEK2, MRE11A, NBN, PALB2, RAD51C, RAD51D. The report date is 05/07/2018.   05/11/2018 Tumor Marker   Patient's tumor was tested for the following markers: CA-125. Results of the tumor marker test revealed 60.5   07/02/2018 Imaging   1. Mild right pericardiophrenic adenopathy is mildly increased, suspicious for metastatic nodes. No abdominopelvic adenopathy. 2. Small left peritoneal soft tissue nodule adjacent to the splenic flexure and smooth left pelvic sidewall peritoneal thickening, cannot exclude residual/recurrent disease.  Attention on follow-up CT advised. 3. Bilateral renal collecting system dilatation has improved. 4.  Aortic Atherosclerosis (ICD10-I70.0).   07/02/2018 Tumor Marker   Patient's tumor was tested for the following markers: CA-125 Results of the tumor marker test revealed 42.7   07/11/2018 Echocardiogram   1. The left ventricle has normal systolic function with an ejection fraction of 60-65%. The cavity size was normal. Left ventricular diastolic parameters were normal.  2. Normal GLS -20.1.  3. The right ventricle has normal systolic function. The cavity was normal. There is no increase in right ventricular wall thickness.  4. The mitral valve is degenerative. Moderate thickening of the mitral valve leaflet. Moderate calcification of the mitral valve leaflet.  5. The aortic valve is tricuspid. Moderate thickening of the aortic valve. Moderate calcification of the aortic valve. Aortic valve regurgitation is trivial by color flow Doppler.  6. The aortic root is normal in size and structure.    Chemotherapy   The patient had doxorubicin and bevacizumab for chemotherapy treatment.     07/30/2018 Tumor Marker   Patient's tumor was tested for the following markers: CA-125 Results of the tumor marker test revealed 82.6   08/27/2018 Tumor Marker   Patient's tumor was tested for the following markers: CA-125 Results of the tumor marker test revealed 111.     REVIEW OF SYSTEMS:   Constitutional: Denies fevers, chills or abnormal weight loss Eyes: Denies blurriness of vision Ears, nose, mouth, throat, and face: Denies mucositis or sore throat Respiratory: Denies cough, dyspnea or wheezes Cardiovascular: Denies palpitation, chest discomfort or lower extremity swelling Gastrointestinal:  Denies nausea, heartburn or change in bowel habits Lymphatics: Denies new lymphadenopathy or easy bruising Neurological:Denies numbness, tingling or new weaknesses Behavioral/Psych: Mood is stable, no new changes   All other systems were reviewed with the patient and are negative.  I have reviewed the past medical history, past surgical history, social history and family history with the patient and they  are unchanged from previous note.  ALLERGIES:  is allergic to tramadol hcl.  MEDICATIONS:  Current Outpatient Medications  Medication Sig Dispense Refill  . acetaminophen (TYLENOL) 500 MG tablet Take 2 tablets (1,000 mg total) by mouth every 6 (six) hours. (Patient taking differently: Take 1,000 mg by mouth every 6 (six) hours as needed. ) 30 tablet 0  . ALPRAZolam (XANAX) 0.5 MG tablet Take 0.5-1 tablets (0.25-0.5 mg total) by mouth daily as needed for anxiety. 30 tablet 2  . alum & mag hydroxide-simeth (MAALOX/MYLANTA) 200-200-20 MG/5ML suspension Take 30 mLs by mouth every 6 (six) hours as needed for indigestion or heartburn. 355 mL 0  . amLODipine (NORVASC) 10 MG tablet Take 1 tablet (10 mg total) by mouth daily. 90 tablet 3  . Calcium Carbonate (CALTRATE 600) 1500 MG TABS Take 600 mg of elemental calcium by mouth daily.     . cholecalciferol (VITAMIN D) 1000 UNITS tablet Take 1,000 Units by mouth daily.     . fluticasone (FLONASE) 50 MCG/ACT nasal spray Use 2 sprays in each nostril daily (Patient taking differently: Place 2 sprays into both nostrils daily. ) 48 g 2  . hydrochlorothiazide (HYDRODIURIL) 25 MG tablet Take 1 tablet (25 mg total) by mouth daily. 90 tablet 1  . ibuprofen (ADVIL,MOTRIN) 200 MG tablet Take 600 mg by mouth every 6 (six) hours as needed for moderate pain.     Marland Kitchen lactose free nutrition (BOOST PLUS) LIQD Take 237 mLs by mouth daily. (Patient taking differently: Take 237 mLs by mouth 2 (two) times daily between meals. ) 10 Can 0  . levothyroxine (SYNTHROID) 75 MCG tablet Take 1 tablet (75 mcg total) by mouth daily. 90 tablet 3  . lidocaine-prilocaine (EMLA) cream Apply to affected area once 30 g 3  . Magnesium 400 MG TABS Take 250 mg by mouth daily.    . Menthol-Methyl  Salicylate (MUSCLE RUB) 10-15 % CREA Apply 1 application topically as needed for muscle pain.    . metoprolol succinate (TOPROL-XL) 50 MG 24 hr tablet Take 1 tablet (50 mg total) by mouth daily. Take with or immediately following a meal. 90 tablet 3  . Multiple Vitamins-Minerals (MULTIVITAMIN WITH MINERALS) tablet Take 1 tablet by mouth daily.      . Omega-3 Fatty Acids (FISH OIL) 1000 MG CAPS Take 2,000 mg by mouth 2 (two) times daily.     Marland Kitchen omeprazole (PRILOSEC) 40 MG capsule Take 1 capsule (40 mg total) by mouth daily. 90 capsule 3  . ondansetron (ZOFRAN) 8 MG tablet Take 1 tablet (8 mg total) by mouth every 8 (eight) hours as needed (Nausea or vomiting). 30 tablet 1  . oxyCODONE (OXY IR/ROXICODONE) 5 MG immediate release tablet Take 1 tablet (5 mg total) by mouth every 6 (six) hours as needed for severe pain. 30 tablet 0  . polyethylene glycol (MIRALAX / GLYCOLAX) packet Take 17 g by mouth daily. (Patient not taking: Reported on 06/08/2018) 14 each 0  . prochlorperazine (COMPAZINE) 10 MG tablet Take 1 tablet (10 mg total) by mouth every 6 (six) hours as needed (Nausea or vomiting). 30 tablet 1  . rivaroxaban (XARELTO) 20 MG TABS tablet Take 1 tablet (20 mg total) by mouth daily with supper. 14 tablet 0  . senna (SENOKOT) 8.6 MG TABS tablet Take 1 tablet (8.6 mg total) by mouth at bedtime as needed for mild constipation. 120 each 0  . senna-docusate (SENOKOT-S) 8.6-50 MG tablet Take 2 tablets by mouth at bedtime. For AFTER surgery  30 tablet 0  . simvastatin (ZOCOR) 20 MG tablet Take 1 tablet (20 mg total) by mouth at bedtime. 90 tablet 3  . vitamin C (ASCORBIC ACID) 500 MG tablet Take 1,000 mg by mouth daily.     No current facility-administered medications for this visit.     PHYSICAL EXAMINATION: ECOG PERFORMANCE STATUS: 1 - Symptomatic but completely ambulatory  Vitals:   08/27/18 1109  BP: 132/66  Pulse: 61  Resp: 17  Temp: 98.2 F (36.8 C)  SpO2: 100%   Filed Weights   08/27/18  1109  Weight: 134 lb 9.6 oz (61.1 kg)    GENERAL:alert, no distress and comfortable SKIN: She has mild persistent petechial changes on her shin area EYES: normal, Conjunctiva are pink and non-injected, sclera clear OROPHARYNX:no exudate, no erythema and lips, buccal mucosa, and tongue normal  NECK: supple, thyroid normal size, non-tender, without nodularity LYMPH:  no palpable lymphadenopathy in the cervical, axillary or inguinal LUNGS: clear to auscultation and percussion with normal breathing effort HEART: regular rate & rhythm and no murmurs and no lower extremity edema ABDOMEN:abdomen soft, non-tender and normal bowel sounds Musculoskeletal:no cyanosis of digits and no clubbing  NEURO: alert & oriented x 3 with fluent speech, no focal motor/sensory deficits  LABORATORY DATA:  I have reviewed the data as listed    Component Value Date/Time   NA 140 08/27/2018 1053   K 3.5 08/27/2018 1053   CL 105 08/27/2018 1053   CO2 27 08/27/2018 1053   GLUCOSE 68 (L) 08/27/2018 1053   BUN 9 08/27/2018 1053   CREATININE 0.68 08/27/2018 1053   CREATININE 0.76 11/17/2015 1005   CALCIUM 9.3 08/27/2018 1053   PROT 6.7 08/27/2018 1053   ALBUMIN 3.4 (L) 08/27/2018 1053   AST 14 (L) 08/27/2018 1053   ALT 13 08/27/2018 1053   ALKPHOS 72 08/27/2018 1053   BILITOT 0.5 08/27/2018 1053   GFRNONAA >60 08/27/2018 1053   GFRAA >60 08/27/2018 1053    No results found for: SPEP, UPEP  Lab Results  Component Value Date   WBC 5.2 08/27/2018   NEUTROABS 3.1 08/27/2018   HGB 12.1 08/27/2018   HCT 37.3 08/27/2018   MCV 94.0 08/27/2018   PLT 125 (L) 08/27/2018      Chemistry      Component Value Date/Time   NA 140 08/27/2018 1053   K 3.5 08/27/2018 1053   CL 105 08/27/2018 1053   CO2 27 08/27/2018 1053   BUN 9 08/27/2018 1053   CREATININE 0.68 08/27/2018 1053   CREATININE 0.76 11/17/2015 1005      Component Value Date/Time   CALCIUM 9.3 08/27/2018 1053   ALKPHOS 72 08/27/2018 1053   AST  14 (L) 08/27/2018 1053   ALT 13 08/27/2018 1053   BILITOT 0.5 08/27/2018 1053       All questions were answered. The patient knows to call the clinic with any problems, questions or concerns. No barriers to learning was detected.  I spent 15 minutes counseling the patient face to face. The total time spent in the appointment was 20 minutes and more than 50% was on counseling and review of test results  Heath Lark, MD 08/28/2018 10:06 AM

## 2018-08-28 NOTE — Assessment & Plan Note (Signed)
She has stable blood pressure control She has small petechial hemorrhages at the anterior shin area likely secondary to bevacizumab We discussed the importance of aggressive blood pressure control and management I recommend her to continue taking metoprolol in the morning with new prescription of hydrochlorothiazide She is instructed to take amlodipine in the evening She is instructed to call me if blood pressure is greater than 688 systolic or if diastolic is greater than 90

## 2018-08-31 ENCOUNTER — Ambulatory Visit: Payer: Self-pay | Admitting: Pharmacist

## 2018-09-04 ENCOUNTER — Telehealth (INDEPENDENT_AMBULATORY_CARE_PROVIDER_SITE_OTHER): Payer: PPO | Admitting: Cardiovascular Disease

## 2018-09-04 ENCOUNTER — Ambulatory Visit: Payer: Self-pay | Admitting: Pharmacist

## 2018-09-04 ENCOUNTER — Encounter: Payer: Self-pay | Admitting: Cardiovascular Disease

## 2018-09-04 VITALS — BP 114/75 | HR 61 | Ht 62.0 in | Wt 134.0 lb

## 2018-09-04 DIAGNOSIS — C561 Malignant neoplasm of right ovary: Secondary | ICD-10-CM

## 2018-09-04 DIAGNOSIS — E785 Hyperlipidemia, unspecified: Secondary | ICD-10-CM

## 2018-09-04 DIAGNOSIS — I700 Atherosclerosis of aorta: Secondary | ICD-10-CM | POA: Diagnosis not present

## 2018-09-04 DIAGNOSIS — G4733 Obstructive sleep apnea (adult) (pediatric): Secondary | ICD-10-CM | POA: Diagnosis not present

## 2018-09-04 DIAGNOSIS — I10 Essential (primary) hypertension: Secondary | ICD-10-CM | POA: Diagnosis not present

## 2018-09-04 NOTE — Patient Instructions (Addendum)
Medication Instructions:  You may take extra 0.5 tablet of Metoprolol as needed for palpitations.  If you need a refill on your cardiac medications before your next appointment, please call your pharmacy.   Follow-Up: At [facility], you and your health needs are our priority.  As part of our continuing mission to provide you with exceptional heart care, we have created designated Provider Care Teams.  These Care Teams include your primary Cardiologist (physician) and Advanced Practice Providers (APPs -  Physician Assistants and Nurse Practitioners) who all work together to provide you with the care you need, when you need it. You will need a follow up appointment in 3 months.  Please call our office 2 months in advance to schedule this appointment.  You may see Shelva Majestic, MD or one of the following Advanced Practice Providers on your designated Care Team: [place], [place] . Fabian Sharp, PA-C

## 2018-09-04 NOTE — Progress Notes (Signed)
Virtual Visit via Video Note   This visit type was conducted due to national recommendations for restrictions regarding the COVID-19 Pandemic (e.g. social distancing) in an effort to limit this patient's exposure and mitigate transmission in our community.  Due to her co-morbid illnesses, this patient is at least at moderate risk for complications without adequate follow up.  This format is felt to be most appropriate for this patient at this time.  All issues noted in this document were discussed and addressed.  A limited physical exam was performed with this format.  Please refer to the patient's chart for her consent to telehealth for [facility].   Date:  09/04/2018   ID:  Haley [name], DOB [birth year], MRN [id number]  Patient Location: Home Provider Location: Home  PCP:  [place], Elona G, MD  Cardiologist:  Shelva Majestic, MD  Electrophysiologist:  None   Evaluation Performed:  Follow-Up Visit  Chief Complaint:  4 month F/U  History of Present Illness:    BRIANNON [name] is a 73 y.o. female who has a history of palpitations, paroxysmal atrial fibrillation, hyperlipidemia, GERD, as well as hypothyroidism.  She also has a history of sleep apnea, currently untreated.  Since I last saw her, she unfortunately was diagnosed with metastatic ovarian CA and has undergone extensive GYN and oncological evaluation. A preoperative chest CT did not show active cardiopulmonary disease and there was no evidence for large central pulmonary embolus.  She was noted to have aortic atherosclerosis without aneurysm or dissection.  On March 27, 2018 she underwent an exploratory laparotomy with bilateral salpingo-oophorectomy, omentectomy, radical tumor debulking for ovarian cancer by Dr. Denman George. She is followed by Dr. Alvy Bimler And so far has had 2 cycles of chemotherapy.  She admits to being fatigued and weak following the treatments but ultimately improves.  Prior to her exploratory laparotomy surgery, she  underwent a myocardial perfusion study which essentially was normal and did not show evidence for scar or ischemia.  Ejection fraction was 57%.  She was given operative clearance.  She was evaluated by me in a telemedicine visit on April 24, 2018.  At that time she was feeling fairly well and denied any episodes of chest tightness or pressure.  She does admit to some fatigue.  She is unaware of any significant recent palpitations.  She states her blood pressure typically was running in the 130/70 range.  Her pulse typically is in the 60s to 70s.  She denied presyncope or syncope or significant swelling.  She admits to some abdominal discomfort.  She also has a history of recent neuropathy.  Since her last evaluation with me, she is been monitored by Dr. Alvy Bimler for her right ovarian epithelial cancer.  She developed small petechial hemorrhages at the anterior shin area which were felt likely secondary to bevacizumab.  In addition, her blood pressure had begun to increase in hydrochlorothiazide was added to her medical regimen of amlodipine and metoprolol.  She does experience rare palpitations.  She is sleeping well but at times notes some palpitations in the early morning.  Remotely, she was diagnosed with sleep apnea and apparently did not tolerate CPAP therapy.  She denies any chest tightness.  She admits to being under increased stress.  She has been monitoring her blood pressure closely with typical systolic blood pressures in the 116-132 range, low prior to additional medication she states she did have an episode where her systolic blood pressure increased to 192.  She presents for follow-up  evaluation  The patient does not have symptoms concerning for COVID-19 infection (fever, chills, cough, or new shortness of breath).    Past Medical History:  Diagnosis Date   Allergy    SEASONAL   Anxiety    Cataract    BILATERAL   Dysrhythmia    Family history of lung cancer    Family history of  prostate cancer    Family history of prostate cancer    Family history of uterine cancer    Fibromyalgia    GERD (gastroesophageal reflux disease)    H/O echocardiogram 04/28/08   EF>55% trace mitral regurgitation, No significant valvular pathology   Hemorrhoids    internal   History of stress test 02/08/2011   Normal Myocardial perfusion study, this is a low risk scan, No prior study available for comparison   Hyperlipidemia    Hypertension    hx of   Hypothyroidism    MVP (mitral valve prolapse)    mild and MR   OA (osteoarthritis)    Osteoporosis    ovarian ca dx'd 12/2017   ovarian cancer   Paroxysmal A-fib (HCC)    Sleep apnea    does not use prescribed CPAP  does not tolerate   Thyroid disease    HYPO   Varicosities of leg    Vitamin D deficiency    Past Surgical History:  Procedure Laterality Date   [place]   with hysterectomy   BILATERAL SALPINGECTOMY N/A 03/27/2018   Procedure: EXPLORATORY LAPARATOMY, BILATERAL SALPINGOOPHERECTOMY;  Surgeon: Everitt Amber, MD;  Location: [facility];  Service: Gynecology;  Laterality: N/A;   BREAST EXCISIONAL BIOPSY Left 1995   Benign   COLONOSCOPY  11-20-2000   DEBULKING N/A 03/27/2018   Procedure: RADICAL TUMOR DEBULKING;  Surgeon: Everitt Amber, MD;  Location: [facility];  Service: Gynecology;  Laterality: N/A;   IR IMAGING GUIDED PORT INSERTION  01/17/2018   NOSE SURGERY  1990   OMENTECTOMY N/A 03/27/2018   Procedure: OMENTECTOMY;  Surgeon: Everitt Amber, MD;  Location: [facility];  Service: Gynecology;  Laterality: N/A;     Current Meds  Medication Sig   acetaminophen (TYLENOL) 500 MG tablet Take 2 tablets (1,000 mg total) by mouth every 6 (six) hours. (Patient taking differently: Take 1,000 mg by mouth every 6 (six) hours as needed. )   ALPRAZolam (XANAX) 0.5 MG tablet Take 0.5-1 tablets (0.25-0.5 mg total) by mouth daily as needed for anxiety.   alum & mag  hydroxide-simeth (MAALOX/MYLANTA) 200-200-20 MG/5ML suspension Take 30 mLs by mouth every 6 (six) hours as needed for indigestion or heartburn.   amLODipine (NORVASC) 10 MG tablet Take 1 tablet (10 mg total) by mouth daily.   Calcium Carbonate (CALTRATE 600) 1500 MG TABS Take 600 mg of elemental calcium by mouth daily.    cholecalciferol (VITAMIN D) 1000 UNITS tablet Take 1,000 Units by mouth daily.    fluticasone (FLONASE) 50 MCG/ACT nasal spray Use 2 sprays in each nostril daily (Patient taking differently: Place 2 sprays into both nostrils daily. )   hydrochlorothiazide (HYDRODIURIL) 25 MG tablet Take 1 tablet (25 mg total) by mouth daily.   ibuprofen (ADVIL,MOTRIN) 200 MG tablet Take 600 mg by mouth every 6 (six) hours as needed for moderate pain.    lactose free nutrition (BOOST PLUS) LIQD Take 237 mLs by mouth daily. (Patient taking differently: Take 237 mLs by mouth 2 (two) times daily between meals. )   levothyroxine (SYNTHROID) 75  MCG tablet Take 1 tablet (75 mcg total) by mouth daily.   lidocaine-prilocaine (EMLA) cream Apply to affected area once   Magnesium 400 MG TABS Take 250 mg by mouth daily.   Menthol-Methyl Salicylate (MUSCLE RUB) 10-15 % CREA Apply 1 application topically as needed for muscle pain.   metoprolol succinate (TOPROL-XL) 50 MG 24 hr tablet Take 1 tablet (50 mg total) by mouth daily. Take with or immediately following a meal.   Multiple Vitamins-Minerals (MULTIVITAMIN WITH MINERALS) tablet Take 1 tablet by mouth daily.     Omega-3 Fatty Acids (FISH OIL) 1000 MG CAPS Take 2,000 mg by mouth 2 (two) times daily.    omeprazole (PRILOSEC) 40 MG capsule Take 1 capsule (40 mg total) by mouth daily.   ondansetron (ZOFRAN) 8 MG tablet Take 1 tablet (8 mg total) by mouth every 8 (eight) hours as needed (Nausea or vomiting).   oxyCODONE (OXY IR/ROXICODONE) 5 MG immediate release tablet Take 1 tablet (5 mg total) by mouth every 6 (six) hours as needed for severe  pain.   polyethylene glycol (MIRALAX / GLYCOLAX) packet Take 17 g by mouth daily.   prochlorperazine (COMPAZINE) 10 MG tablet Take 1 tablet (10 mg total) by mouth every 6 (six) hours as needed (Nausea or vomiting).   rivaroxaban (XARELTO) 20 MG TABS tablet Take 1 tablet (20 mg total) by mouth daily with supper.   senna (SENOKOT) 8.6 MG TABS tablet Take 1 tablet (8.6 mg total) by mouth at bedtime as needed for mild constipation.   simvastatin (ZOCOR) 20 MG tablet Take 1 tablet (20 mg total) by mouth at bedtime.   vitamin C (ASCORBIC ACID) 500 MG tablet Take 1,000 mg by mouth daily.     Allergies:   Tramadol hcl   Social History   Tobacco Use   Smoking status: Light Tobacco Smoker    Years: 10.00    Types: Cigars   Smokeless tobacco: Never Used   Tobacco comment: not daily  [place] cigars 1-2   Substance Use Topics   Alcohol use: No    Alcohol/week: 0.0 standard drinks   Drug use: No     Family Hx: The patient's family history includes Cancer in her paternal aunt; Diabetes in her daughter and mother; Heart disease in her mother; Hypertension in her mother; Lung cancer in her niece; Prostate cancer in her paternal uncle; Stroke in her maternal grandmother; Uterine cancer in her cousin. There is no history of Colon cancer.  ROS:   Please see the history of present illness.    Negative for fever chills night sweats No cough No wheezing Development of small petechial hemorrhages in the anterior shin area  All other systems reviewed and are negative.   Prior CV studies:   The following studies were reviewed today:  07/11/2018 Echocardiogram   1. The left ventricle has normal systolic function with an ejection fraction of 60-65%. The cavity size was normal. Left ventricular diastolic parameters were normal. 2. Normal GLS -20.1. 3. The right ventricle has normal systolic function. The cavity was normal. There is no increase in right ventricular wall thickness. 4. The  mitral valve is degenerative. Moderate thickening of the mitral valve leaflet. Moderate calcification of the mitral valve leaflet. 5. The aortic valve is tricuspid. Moderate thickening of the aortic valve. Moderate calcification of the aortic valve. Aortic valve regurgitation is trivial by color flow Doppler. 6. The aortic root is normal in size and structure.      Labs/Other Tests and  Data Reviewed:    EKG:  An ECG dated 03/08/2018 was personally reviewed today and demonstrated:  Sinus bradycardia at 59 bpm.  QS complex anteroseptally  Recent Labs: 01/20/2018: TSH 0.461 01/29/2018: Magnesium 1.8 08/27/2018: ALT 13; BUN 9; Creatinine 0.68; Hemoglobin 12.1; Platelet Count 125; Potassium 3.5; Sodium 140   Recent Lipid Panel Lab Results  Component Value Date/Time   CHOL 157 11/03/2017 10:19 AM   TRIG 154.0 (H) 11/03/2017 10:19 AM   HDL 56.00 11/03/2017 10:19 AM   CHOLHDL 3 11/03/2017 10:19 AM   LDLCALC 70 11/03/2017 10:19 AM   LDLDIRECT 84.3 12/16/2010 09:21 AM    Wt Readings from Last 3 Encounters:  09/04/18 134 lb (60.8 kg)  08/27/18 134 lb 9.6 oz (61.1 kg)  08/13/18 136 lb (61.7 kg)     Objective:    Vital Signs:  BP 114/75    Pulse 61    Ht 5\' 2"  (1.575 m)    Wt 134 lb (60.8 kg)    BMI 24.51 kg/m    She appears well-developed and well-nourished in no acute distress. Breathing was normal and not labored HEENT was unchanged and unremarkable There did not appear to be JVD. There was no audible wheezing There was no chest pain to palpation Her heart rhythm was stable by her palpation There was no abdominal tenderness or bloating I was able to visually see the petechial hemorrhages on her shin of lower extremities She.  Grossly nonfocal from a neurologic standpoint She believes she is sleeping well  ASSESSMENT & PLAN:    1. Essential hypertension: She recently was found to have some blood pressure lability and HCTZ was added to her amlodipine 10 mg in addition to  Toprol-XL 50 mg.  Her palpitations are fairly controlled but I discussed with her that she can perhaps take an extra half of metoprolol on an as-needed basis with recurrent symptomatology. 2. Right ovarian epithelial cancer: Followed by Dr.Gorsuch.  I reviewed her records.  Patient continues to be on bevacizumab there was some concern that this may be contributing to her hypertension and petechial rash. 3. Hyperlipidemia: She continues to be on simvastatin 20 mg with most recent LDL 70 4. Obstructive sleep apnea: She believes she is sleeping well.  However in the past she was found to have moderate sleep apnea.  She did not tolerate CPAP.  We discussed potential customized oral appliance.  It is possible that the palpitations noted in the early morning may be related to her untreated sleep apnea. 5. Petechial rash: Felt to be a side reaction of her bevacizumab. 6. Anxiety, she is on Xanax which he takes on an as-needed basis  COVID-19 Education: The signs and symptoms of COVID-19 were discussed with the patient and how to seek care for testing (follow up with PCP or arrange E-visit).  The importance of social distancing was discussed today.  Time:   Today, I have spent 29 minutes with the patient with telehealth technology discussing the above problems.     Medication Adjustments/Labs and Tests Ordered: Current medicines are reviewed at length with the patient today.  Concerns regarding medicines are outlined above.   Tests Ordered: No orders of the defined types were placed in this encounter.   Medication Changes: No orders of the defined types were placed in this encounter.   Follow Up: 3 months office visit  Signed, Shelva Majestic, MD  09/04/2018 9:46 AM    [place]

## 2018-09-05 ENCOUNTER — Other Ambulatory Visit: Payer: Self-pay

## 2018-09-05 ENCOUNTER — Telehealth: Payer: Self-pay | Admitting: Hematology and Oncology

## 2018-09-05 ENCOUNTER — Telehealth: Payer: Self-pay | Admitting: Oncology

## 2018-09-05 ENCOUNTER — Inpatient Hospital Stay: Payer: PPO

## 2018-09-05 ENCOUNTER — Other Ambulatory Visit: Payer: Self-pay | Admitting: Hematology and Oncology

## 2018-09-05 DIAGNOSIS — R300 Dysuria: Secondary | ICD-10-CM

## 2018-09-05 DIAGNOSIS — Z5112 Encounter for antineoplastic immunotherapy: Secondary | ICD-10-CM | POA: Diagnosis not present

## 2018-09-05 LAB — URINALYSIS, COMPLETE (UACMP) WITH MICROSCOPIC
Bacteria, UA: NONE SEEN
Bilirubin Urine: NEGATIVE
Glucose, UA: NEGATIVE mg/dL
Ketones, ur: NEGATIVE mg/dL
Nitrite: NEGATIVE
Protein, ur: NEGATIVE mg/dL
Specific Gravity, Urine: 1.003 — ABNORMAL LOW (ref 1.005–1.030)
pH: 6 (ref 5.0–8.0)

## 2018-09-05 NOTE — Telephone Encounter (Signed)
Appointment scheduled for 1:30 today.  Haley [name] also asked if there is anything to help with the pain while waiting for the culture results.

## 2018-09-05 NOTE — Telephone Encounter (Signed)
She has pain medications to take

## 2018-09-05 NOTE — Telephone Encounter (Signed)
Haley [name] called and said she thinks she has a UTI again.  She said her bladder is "painful" all the time and she is urinating frequently with small amounts.  She said it started last night and feels the same as the last UTI she had.  Asked if she would be able to come in for a urinalysis/culture and she asked if Dr. Alvy Bimler would be able to send her in something like Cipro.

## 2018-09-05 NOTE — Telephone Encounter (Signed)
Called Cherie back and advised her of message from Dr. Alvy Bimler.  She verbalized agreement.

## 2018-09-05 NOTE — Telephone Encounter (Signed)
Scheduled appt per 8/25 sch message = spoke with pt and she is aware of appt date and time

## 2018-09-05 NOTE — Telephone Encounter (Signed)
I will send order for urinalysis and urine culture Please set up appt I do not plan to send cipro until culture is available just in case there is resistance

## 2018-09-06 ENCOUNTER — Ambulatory Visit: Payer: Self-pay | Admitting: Pharmacist

## 2018-09-06 ENCOUNTER — Telehealth: Payer: Self-pay

## 2018-09-06 ENCOUNTER — Other Ambulatory Visit: Payer: Self-pay | Admitting: Pharmacist

## 2018-09-06 ENCOUNTER — Other Ambulatory Visit: Payer: Self-pay

## 2018-09-06 MED ORDER — CIPROFLOXACIN HCL 250 MG PO TABS: 250.0000 mg | ORAL_TABLET | Freq: Two times a day (BID) | ORAL | 0 refills | Status: AC

## 2018-09-06 NOTE — Telephone Encounter (Signed)
-----   Message from Heath Lark, MD sent at 09/06/2018 11:58 AM EDT ----- Regarding: Urine culture Urine culture showed some growth Please call in cipro 250 mg BID PO x 3 days

## 2018-09-06 NOTE — Telephone Encounter (Signed)
Called and given below message. She verbalized understanding. Rx sent to pharmacy. °

## 2018-09-06 NOTE — Patient Outreach (Signed)
[facility]) Care Management [place]  09/06/2018  KEESHA PELLUM December 08, 1945 [id number]  Reason for referral: medication assistance/management  Successful care coordination HTA to discuss patient's TROOP in order to apply to Xarelto patient assistance.  The goal is to apply for Xarelto patient assistance, however patient has not yet met out of pocket minimum.  She has only met $643.45 (out of pocket) and needs to meet $900 to qualify for patient assistance program. I will continue to follow patient and we will apply as able. Patient is now in the coverage gap, but is able to pay the copay $150/3 month supply.  She reports she is doing "okay" today.  She has had uncontrolled hypertension that reports was a side effect of her current chemotherapy.  She is now on amlodipine and HCTZ (continues on metoprolol).  She is checking her BP at home at reports it was been around 130/80.  She reports no other medication changes.  Encouraged compliance with all medications.  Patient verbalizes understanding.  Encouraged patient to call if needs arise.  Thank you for allowing [facility] pharmacy to be involved in this patient's care.    PLAN: -Will follow up with patient in 3 months  Regina Eck, PharmD, [place]  [phone]

## 2018-09-07 LAB — URINE CULTURE: Culture: 20000 — AB

## 2018-09-10 ENCOUNTER — Inpatient Hospital Stay: Payer: PPO

## 2018-09-10 ENCOUNTER — Encounter: Payer: Self-pay | Admitting: Hematology and Oncology

## 2018-09-10 ENCOUNTER — Telehealth: Payer: Self-pay | Admitting: Family Medicine

## 2018-09-10 ENCOUNTER — Inpatient Hospital Stay (HOSPITAL_BASED_OUTPATIENT_CLINIC_OR_DEPARTMENT_OTHER): Payer: PPO | Admitting: Hematology and Oncology

## 2018-09-10 ENCOUNTER — Other Ambulatory Visit: Payer: Self-pay

## 2018-09-10 VITALS — BP 130/67 | HR 56 | Temp 98.3°F | Resp 17 | Ht 62.0 in | Wt 136.0 lb

## 2018-09-10 DIAGNOSIS — M79606 Pain in leg, unspecified: Secondary | ICD-10-CM

## 2018-09-10 DIAGNOSIS — C561 Malignant neoplasm of right ovary: Secondary | ICD-10-CM

## 2018-09-10 DIAGNOSIS — R300 Dysuria: Secondary | ICD-10-CM

## 2018-09-10 DIAGNOSIS — I10 Essential (primary) hypertension: Secondary | ICD-10-CM | POA: Diagnosis not present

## 2018-09-10 DIAGNOSIS — Z5112 Encounter for antineoplastic immunotherapy: Secondary | ICD-10-CM | POA: Diagnosis not present

## 2018-09-10 DIAGNOSIS — K6289 Other specified diseases of anus and rectum: Secondary | ICD-10-CM | POA: Diagnosis not present

## 2018-09-10 DIAGNOSIS — C569 Malignant neoplasm of unspecified ovary: Secondary | ICD-10-CM

## 2018-09-10 LAB — CMP (CANCER CENTER ONLY)
ALT: 12 U/L (ref 0–44)
AST: 15 U/L (ref 15–41)
Albumin: 3.4 g/dL — ABNORMAL LOW (ref 3.5–5.0)
Alkaline Phosphatase: 84 U/L (ref 38–126)
Anion gap: 9 (ref 5–15)
BUN: 8 mg/dL (ref 8–23)
CO2: 27 mmol/L (ref 22–32)
Calcium: 9.7 mg/dL (ref 8.9–10.3)
Chloride: 102 mmol/L (ref 98–111)
Creatinine: 0.72 mg/dL (ref 0.44–1.00)
GFR, Est AFR Am: >60 mL/min (ref >60)
GFR, Estimated: >60 mL/min (ref >60)
Glucose, Bld: 71 mg/dL (ref 70–99)
Potassium: 3.6 mmol/L (ref 3.5–5.1)
Sodium: 138 mmol/L (ref 135–145)
Total Bilirubin: 0.3 mg/dL (ref 0.3–1.2)
Total Protein: 6.8 g/dL (ref 6.5–8.1)

## 2018-09-10 LAB — CBC WITH DIFFERENTIAL (CANCER CENTER ONLY)
Abs Immature Granulocytes: 0.05 10*3/uL (ref 0.00–0.07)
Basophils Absolute: 0.1 10*3/uL (ref 0.0–0.1)
Basophils Relative: 1 %
Eosinophils Absolute: 0.3 10*3/uL (ref 0.0–0.5)
Eosinophils Relative: 6 %
HCT: 36.7 % (ref 36.0–46.0)
Hemoglobin: 12.3 g/dL (ref 12.0–15.0)
Immature Granulocytes: 1 %
Lymphocytes Relative: 25 %
Lymphs Abs: 1.3 10*3/uL (ref 0.7–4.0)
MCH: 31.4 pg (ref 26.0–34.0)
MCHC: 33.5 g/dL (ref 30.0–36.0)
MCV: 93.6 fL (ref 80.0–100.0)
Monocytes Absolute: 0.9 10*3/uL (ref 0.1–1.0)
Monocytes Relative: 16 %
Neutro Abs: 2.7 10*3/uL (ref 1.7–7.7)
Neutrophils Relative %: 51 %
Platelet Count: 145 10*3/uL — ABNORMAL LOW (ref 150–400)
RBC: 3.92 MIL/uL (ref 3.87–5.11)
RDW: 13.6 % (ref 11.5–15.5)
WBC Count: 5.3 10*3/uL (ref 4.0–10.5)
nRBC: 0 % (ref 0.0–0.2)

## 2018-09-10 LAB — TOTAL PROTEIN, URINE DIPSTICK: Protein, ur: NEGATIVE mg/dL

## 2018-09-10 MED ORDER — SODIUM CHLORIDE 0.9% FLUSH
10.0000 mL | Freq: Once | INTRAVENOUS | Status: AC
  Administered 2018-09-10: 10 mL
  Filled 2018-09-10: qty 10

## 2018-09-10 MED ORDER — HEPARIN SOD (PORK) LOCK FLUSH 100 UNIT/ML IV SOLN
500.0000 [IU] | Freq: Once | INTRAVENOUS | Status: AC
  Administered 2018-09-10: 500 [IU]
  Filled 2018-09-10: qty 5

## 2018-09-10 NOTE — Assessment & Plan Note (Signed)
She has significant rectal pain recently after bowel movement From what she describes, it is likely related to either a rectal fissure or hemorrhoidal pain I suggest additional stool softener, laxative, glycerin suppository, reduce fiber intake and GI consult After a very prolonged discussion, she is undecided Ultimately, she is in agreement to hold treatment and will call her primary care doctor for further evaluation

## 2018-09-10 NOTE — Assessment & Plan Note (Signed)
She has recurrent UTI She will complete her course of antibiotics

## 2018-09-10 NOTE — Assessment & Plan Note (Signed)
Her blood pressure control is satisfactory She will continue current prescription anti-hypertensives

## 2018-09-10 NOTE — Assessment & Plan Note (Signed)
She tolerated treatment well with expected side effects such as mild pancytopenia and hypertension However, due to her ongoing rectal pain, I suggest we hold her treatment for 2 weeks to allow recovery before resumption of treatment The patient is adamant she is afraid to hold treatment I explained to her, with ongoing treatment, it is very likely that she will have very slow recovery and she will continue to suffer from rectal pain unnecessarily After very prolonged discussion, she is in agreement to hold treatment I will schedule CT imaging to be done at the end of the month for objective assessment of response to therapy

## 2018-09-10 NOTE — Progress Notes (Signed)
[name] OFFICE PROGRESS NOTE  Patient Care Team: Martin[place], Romayne G, MD as PCP - General (Family Medicine) Troy Sine, MD as PCP - Cardiology (Cardiology) Awanda Mink Craige Cotta, RN as Oncology Nurse Navigator (Oncology)  ASSESSMENT & PLAN:  Right ovarian epithelial cancer [facility]) She tolerated treatment well with expected side effects such as mild pancytopenia and hypertension However, due to her ongoing rectal pain, I suggest we hold her treatment for 2 weeks to allow recovery before resumption of treatment The patient is adamant she is afraid to hold treatment I explained to her, with ongoing treatment, it is very likely that she will have very slow recovery and she will continue to suffer from rectal pain unnecessarily After very prolonged discussion, she is in agreement to hold treatment I will schedule CT imaging to be done at the end of the month for objective assessment of response to therapy  Rectal pain She has significant rectal pain recently after bowel movement From what she describes, it is likely related to either a rectal fissure or hemorrhoidal pain I suggest additional stool softener, laxative, glycerin suppository, reduce fiber intake and GI consult After a very prolonged discussion, she is undecided Ultimately, she is in agreement to hold treatment and will call her primary care doctor for further evaluation  Essential hypertension, benign Her blood pressure control is satisfactory She will continue current prescription anti-hypertensives  Dysuria She has recurrent UTI She will complete her course of antibiotics   Orders Placed This Encounter  Procedures  . CT ABDOMEN PELVIS W CONTRAST    Standing Status:   Future    Standing Expiration Date:   09/10/2019    Order Specific Question:   If indicated for the ordered procedure, I authorize the administration of contrast media per Radiology protocol    Answer:   Yes    Order Specific Question:    Preferred imaging location?    Answer:   [facility]    Order Specific Question:   Radiology Contrast Protocol - do NOT remove file path    Answer:   [email or web address]    INTERVAL HISTORY: Please see below for problem oriented charting. She returns for cycle 3 of treatment Since the last time I saw her, her blood pressure control has improved Systolic blood pressure range from 563-893 and diastolic ranged from 73-42 She was diagnosed with recent urinary tract infection but just started antibiotics yesterday Her main complaint today is related to recent changes in caliber of her stool She started to have severe rectal pain with bowel movement She did not think she is constipated She goes to the bathroom every day She felt that her stool is soft She has noticed some recent rectal bleeding She denies abdominal discomfort The bleeding is so severe that she ruminates about this problem for 15 minutes today and does not seem to be satisfied with some of the suggestions I have made The petechiae rash over her shin is less She denies recent mucositis or nausea  SUMMARY OF ONCOLOGIC HISTORY: Oncology History Overview Note  Neg for genetics blood work and HRD   Right ovarian epithelial cancer ([place])  01/06/2018 Imaging   Ct abdomen and pelvis 1. Complex cystic mass at the right adnexa, measuring 5.9 x 4.0 cm, with nodular components, concerning for primary ovarian malignancy. 2. Diffuse nodularity along the omentum at the left side of the abdomen, extending into the mesentery at the left mid abdomen, concerning for peritoneal carcinomatosis. 3. Wall thickening  at the distal ileum adjacent to the ovarian mass; bowel loops appear somewhat adherent to the ovarian mass. Bowel infiltration with tumor cannot be excluded. No evidence of bowel obstruction at this time. 4. Small volume ascites within the abdomen and pelvis.  Aortic Atherosclerosis (ICD10-I70.0).    01/08/2018 Tumor Marker   Patient's tumor was tested for the following markers: CA-125. Results of the tumor marker test revealed 3004   01/15/2018 Imaging   Chest CT:  1. No active cardiopulmonary disease. 2. Aortic atherosclerosis without aneurysm or dissection. 3. No large central pulmonary embolus.  CT AP:  1. Dilated fluid-filled loops of small bowel are redemonstrated slightly more extensive than on prior exam with transition point likely in the right adnexa adjacent to a complex cystic mass concerning for ovarian neoplasm given septations and soft tissue nodularity. This raises concern for early or partial SBO. This soft tissue mass measures 4.9 x 4 x 4.7 cm and has not changed since prior recent comparison. Additional short segmental area of luminal narrowing is noted in the right lower quadrant involving small bowel for which stigmata of peritoneal carcinomatosis or small-bowel metastatic implants might account for this. 2. Redemonstration of small volume of ascites predominantly in the upper abdomen surrounding the liver and spleen. 3. Redemonstration of thick bandlike omental thickening concerning for peritoneal carcinomatosis.    01/15/2018 Procedure   Successful ultrasound-guided diagnostic and therapeutic paracentesis yielding 1.5 liters of peritoneal fluid.    01/15/2018 Pathology Results   PERITONEAL/ASCITIC FLUID (SPECIMEN 1 OF 1 COLLECTED 01/18/18): MALIGNANT CELLS CONSISTENT WITH METASTATIC ADENOCARCINOMA. SEE COMMENT. COMMENT: THE MALIGNANT CELLS ARE POSITIVE FOR MOC-31, CYTOKERATIN 7, ESTROGEN RECEPTOR, PAX-8, AND WT-1. THEY ARE NEGATIVE FOR CALRETININ, CYTOKERATIN 5/6, AND CYTOKERATIN 20. THE PROFILE IS CONSISTENT WITH A PRIMARY GYNECOLOGIC CARCINOMA. THERE IS LIKELY SUFFICIENT TUMOR PRESENT, IF ADDITIONAL STUDIES ARE REQUESTED.   01/15/2018 - 02/02/2018 Hospital Admission   She was admitted to the hospital for SBO. She was treated with chemotherapy   01/18/2018 -  06/20/2018 Chemotherapy   The patient had carboplatin and taxol   02/08/2018 Cancer Staging   Staging form: Ovary, Fallopian Tube, and Primary Peritoneal Carcinoma, AJCC 8th Edition - Clinical: cT3, cN0, cM0 - Signed by Heath Lark, MD on 02/08/2018   02/08/2018 Tumor Marker   Patient's tumor was tested for the following markers: CA-125. Results of the tumor marker test revealed 445   03/02/2018 Tumor Marker   Patient's tumor was tested for the following markers: CA-125. Results of the tumor marker test revealed 174   03/15/2018 Imaging   6.2 cm complex cystic right ovarian mass, compatible with malignant ovarian neoplasm, mildly progressive.  Associated peritoneal disease/omental caking beneath the anterior abdominal wall, mildly improved. Prior abdominal ascites is improved/resolved.  4.7 cm cystic left ovarian mass, without overt malignant features, grossly unchanged.  Mild bilateral hydroureteronephrosis, secondary to extrinsic compression, new.  Prior small bowel obstruction has improved/resolved.    03/27/2018 Pathology Results   1. Omentum, resection for tumor - OMENTUM: - HIGH GRADE SEROUS CARCINOMA. 2. Ovary, left - LEFT OVARY: - HIGH GRADE SEROUS CARCINOMA. - LEFT FALLOPIAN TUBE: - HIGH GRADE SEROUS CARCINOMA. 3. Adnexa - ovary +/- tube, neoplastic, right - RIGHT OVARY: - HIGH GRADE SEROUS CARCINOMA, 5.5 CM. - RIGHT FALLOPIAN TUBE: - HIGH GRADE SEROUS CARCINOMA. 4. Peritoneum, biopsy, nodule - PERITONEUM: - NODULES OF HIGH GRADE SEROUS CARCINOMA. 5. Peritoneum, resection for tumor, rectosigmoid - RECTOSIGMOID PERITONEUM: - HIGH GRADE SEROUS CARCINOMA. Microscopic Comment 3. OVARY or FALLOPIAN TUBE  or PRIMARY PERITONEUM: Procedure: Bilateral salpingo-oophorectomy, omentectomy and peritoneal biopsies. Specimen Integrity: N/A. Tumor Site: Right ovary. Ovarian Surface Involvement (required only if applicable): Yes. Fallopian Tube Surface Involvement (required  only if applicable): Yes. Tumor Size: 5.5 x 4.8 x 4.0 cm. Histologic Type: Serous carcinoma. Histologic Grade: High grade. Implants (required for advanced stage serous/seromucinous borderline tumors only): Omentum, left ovary and fallopian tube, rectosigmoid peritoneum and peritoneum. Other Tissue/ Organ Involvement: Left ovary and fallopian tube, omentum and rectosigmoid peritoneum. Largest Extrapelvic Peritoneal Focus (required only if applicable): 85.4 cm, omentum. Peritoneal/Ascitic Fluid: N/A. Treatment Effect (required only for high-grade serous carcinomas): Minimal. Regional Lymph Nodes: No lymph nodes submitted. Pathologic Stage Classification (pTNM, AJCC 8th Edition): pT3c, pNX. Representative Tumor Block: 3A, 3B, 3D, 3E and 6F. Comment(s): There is a 5.5 cm in greatest dimension partially cystic high grade serous carcinoma involving the right adnexal specimen and the tumor is staged as a primary right ovarian carcinoma. The carcinoma also involves the left ovary as well as the left fallopian tube, the omentum, peritoneum and the rectosigmoid peritoneal specimens.   03/27/2018 Surgery   Preoperative Diagnosis: ovarian cancer, stage IIIC, metastatic to omentum, peritoneum, serosa of intestines   Procedure(s) Performed: 1. Exploratory laparotomy with bilateral salpingo-oophorectomy, omentectomy radical tumor debulking for ovarian cancer .  Surgeon: Thereasa Solo, MD.   Specimens: Bilateral tubes / ovaries, omentum. Peritoneal nodules, rectosigmoid nodules.    Operative Findings: omental cake, miliary studding on diaphragm and bilateral paracolic gutters. Sigmoid colon densely adherent to left and right ovaries with tumor rind, ureters mildly dilated bilaterally due to retroperitoneal extension of the tumor towards ureters causing compression.    This represented an optimal cytoreduction (R1) with gross residual disease on the diaphragm that had been ablated and a thin tumor plaque  on the sigmoid colon that was ablated. No residual disease >1cm remaining   05/07/2018 Genetic Testing   Negative genetic testing on the [employer] TumorNextHRD+ CancerNext Panel. The CancerNext gene panel offered by [employer] includes sequencing and rearrangement analysis for the following 34 genes:   APC, ATM, BARD1, BMPR1A, BRCA1, BRCA2, BRIP1, CDH1, CDK4, CDKN2A, CHEK2, DICER1, EPCAM, GREM1, HOXB13, MLH1, MRE11A, MSH2, MSH6, MUTYH, NBN, NF1, PALB2, PMS2, POLD1, POLE, PTEN, RAD50, RAD51C, RAD51D, SMAD4, SMARCA4, STK11, and TP53. Somatic genes analyzed through TumorNext-HRD: ATM, BARD1, BRCA1, BRCA2, BRIP1, CHEK2, MRE11A, NBN, PALB2, RAD51C, RAD51D. The report date is 05/07/2018.   05/11/2018 Tumor Marker   Patient's tumor was tested for the following markers: CA-125. Results of the tumor marker test revealed 60.5   07/02/2018 Imaging   1. Mild right pericardiophrenic adenopathy is mildly increased, suspicious for metastatic nodes. No abdominopelvic adenopathy. 2. Small left peritoneal soft tissue nodule adjacent to the splenic flexure and smooth left pelvic sidewall peritoneal thickening, cannot exclude residual/recurrent disease. Attention on follow-up CT advised. 3. Bilateral renal collecting system dilatation has improved. 4.  Aortic Atherosclerosis (ICD10-I70.0).   07/02/2018 Tumor Marker   Patient's tumor was tested for the following markers: CA-125 Results of the tumor marker test revealed 42.7   07/11/2018 Echocardiogram   1. The left ventricle has normal systolic function with an ejection fraction of 60-65%. The cavity size was normal. Left ventricular diastolic parameters were normal.  2. Normal GLS -20.1.  3. The right ventricle has normal systolic function. The cavity was normal. There is no increase in right ventricular wall thickness.  4. The mitral valve is degenerative. Moderate thickening of the mitral valve leaflet. Moderate calcification of the mitral valve leaflet.  5. The  aortic valve is tricuspid. Moderate thickening of the aortic valve. Moderate calcification of the aortic valve. Aortic valve regurgitation is trivial by color flow Doppler.  6. The aortic root is normal in size and structure.    Chemotherapy   The patient had doxorubicin and bevacizumab for chemotherapy treatment.     07/30/2018 Tumor Marker   Patient's tumor was tested for the following markers: CA-125 Results of the tumor marker test revealed 82.6   08/27/2018 Tumor Marker   Patient's tumor was tested for the following markers: CA-125 Results of the tumor marker test revealed 111.     REVIEW OF SYSTEMS:   Constitutional: Denies fevers, chills or abnormal weight loss Eyes: Denies blurriness of vision Ears, nose, mouth, throat, and face: Denies mucositis or sore throat Respiratory: Denies cough, dyspnea or wheezes Cardiovascular: Denies palpitation, chest discomfort or lower extremity swelling Skin: Denies abnormal skin rashes Lymphatics: Denies new lymphadenopathy or easy bruising Neurological:Denies numbness, tingling or new weaknesses Behavioral/Psych: Mood is stable, no new changes  All other systems were reviewed with the patient and are negative.  I have reviewed the past medical history, past surgical history, social history and family history with the patient and they are unchanged from previous note.  ALLERGIES:  is allergic to tramadol hcl.  MEDICATIONS:  Current Outpatient Medications  Medication Sig Dispense Refill  . acetaminophen (TYLENOL) 500 MG tablet Take 2 tablets (1,000 mg total) by mouth every 6 (six) hours. (Patient taking differently: Take 1,000 mg by mouth every 6 (six) hours as needed. ) 30 tablet 0  . ALPRAZolam (XANAX) 0.5 MG tablet Take 0.5-1 tablets (0.25-0.5 mg total) by mouth daily as needed for anxiety. 30 tablet 2  . alum & mag hydroxide-simeth (MAALOX/MYLANTA) 200-200-20 MG/5ML suspension Take 30 mLs by mouth every 6 (six) hours as needed for  indigestion or heartburn. 355 mL 0  . amLODipine (NORVASC) 10 MG tablet Take 1 tablet (10 mg total) by mouth daily. 90 tablet 3  . Calcium Carbonate (CALTRATE 600) 1500 MG TABS Take 600 mg of elemental calcium by mouth daily.     . cholecalciferol (VITAMIN D) 1000 UNITS tablet Take 1,000 Units by mouth daily.     . fluticasone (FLONASE) 50 MCG/ACT nasal spray Use 2 sprays in each nostril daily (Patient taking differently: Place 2 sprays into both nostrils daily. ) 48 g 2  . hydrochlorothiazide (HYDRODIURIL) 25 MG tablet Take 1 tablet (25 mg total) by mouth daily. 90 tablet 1  . ibuprofen (ADVIL,MOTRIN) 200 MG tablet Take 600 mg by mouth every 6 (six) hours as needed for moderate pain.     Marland Kitchen lactose free nutrition (BOOST PLUS) LIQD Take 237 mLs by mouth daily. (Patient taking differently: Take 237 mLs by mouth 2 (two) times daily between meals. ) 10 Can 0  . levothyroxine (SYNTHROID) 75 MCG tablet Take 1 tablet (75 mcg total) by mouth daily. 90 tablet 3  . lidocaine-prilocaine (EMLA) cream Apply to affected area once 30 g 3  . Magnesium 400 MG TABS Take 250 mg by mouth daily.    . Menthol-Methyl Salicylate (MUSCLE RUB) 10-15 % CREA Apply 1 application topically as needed for muscle pain.    . metoprolol succinate (TOPROL-XL) 50 MG 24 hr tablet Take 1 tablet (50 mg total) by mouth daily. Take with or immediately following a meal. 90 tablet 3  . Multiple Vitamins-Minerals (MULTIVITAMIN WITH MINERALS) tablet Take 1 tablet by mouth daily.      . Omega-3  Fatty Acids (FISH OIL) 1000 MG CAPS Take 2,000 mg by mouth 2 (two) times daily.     Marland Kitchen omeprazole (PRILOSEC) 40 MG capsule Take 1 capsule (40 mg total) by mouth daily. 90 capsule 3  . ondansetron (ZOFRAN) 8 MG tablet Take 1 tablet (8 mg total) by mouth every 8 (eight) hours as needed (Nausea or vomiting). 30 tablet 1  . oxyCODONE (OXY IR/ROXICODONE) 5 MG immediate release tablet Take 1 tablet (5 mg total) by mouth every 6 (six) hours as needed for severe  pain. 30 tablet 0  . polyethylene glycol (MIRALAX / GLYCOLAX) packet Take 17 g by mouth daily. 14 each 0  . prochlorperazine (COMPAZINE) 10 MG tablet Take 1 tablet (10 mg total) by mouth every 6 (six) hours as needed (Nausea or vomiting). 30 tablet 1  . rivaroxaban (XARELTO) 20 MG TABS tablet Take 1 tablet (20 mg total) by mouth daily with supper. 14 tablet 0  . senna (SENOKOT) 8.6 MG TABS tablet Take 1 tablet (8.6 mg total) by mouth at bedtime as needed for mild constipation. 120 each 0  . senna-docusate (SENOKOT-S) 8.6-50 MG tablet Take 2 tablets by mouth at bedtime. For AFTER surgery (Patient not taking: Reported on 09/04/2018) 30 tablet 0  . simvastatin (ZOCOR) 20 MG tablet Take 1 tablet (20 mg total) by mouth at bedtime. 90 tablet 3  . vitamin C (ASCORBIC ACID) 500 MG tablet Take 1,000 mg by mouth daily.     No current facility-administered medications for this visit.     PHYSICAL EXAMINATION: ECOG PERFORMANCE STATUS: 1 - Symptomatic but completely ambulatory  Vitals:   09/10/18 1148  BP: 130/67  Pulse: (!) 56  Resp: 17  Temp: 98.3 F (36.8 C)  SpO2: 100%   Filed Weights   09/10/18 1148  Weight: 136 lb (61.7 kg)    GENERAL:alert, no distress and comfortable Musculoskeletal:no cyanosis of digits and no clubbing  NEURO: alert & oriented x 3 with fluent speech, no focal motor/sensory deficits  LABORATORY DATA:  I have reviewed the data as listed    Component Value Date/Time   NA 138 09/10/2018 1132   K 3.6 09/10/2018 1132   CL 102 09/10/2018 1132   CO2 27 09/10/2018 1132   GLUCOSE 71 09/10/2018 1132   BUN 8 09/10/2018 1132   CREATININE 0.72 09/10/2018 1132   CREATININE 0.76 11/17/2015 1005   CALCIUM 9.7 09/10/2018 1132   PROT 6.8 09/10/2018 1132   ALBUMIN 3.4 (L) 09/10/2018 1132   AST 15 09/10/2018 1132   ALT 12 09/10/2018 1132   ALKPHOS 84 09/10/2018 1132   BILITOT 0.3 09/10/2018 1132   GFRNONAA >60 09/10/2018 1132   GFRAA >60 09/10/2018 1132    No results  found for: SPEP, UPEP  Lab Results  Component Value Date   WBC 5.3 09/10/2018   NEUTROABS 2.7 09/10/2018   HGB 12.3 09/10/2018   HCT 36.7 09/10/2018   MCV 93.6 09/10/2018   PLT 145 (L) 09/10/2018      Chemistry      Component Value Date/Time   NA 138 09/10/2018 1132   K 3.6 09/10/2018 1132   CL 102 09/10/2018 1132   CO2 27 09/10/2018 1132   BUN 8 09/10/2018 1132   CREATININE 0.72 09/10/2018 1132   CREATININE 0.76 11/17/2015 1005      Component Value Date/Time   CALCIUM 9.7 09/10/2018 1132   ALKPHOS 84 09/10/2018 1132   AST 15 09/10/2018 1132   ALT 12 09/10/2018 1132  BILITOT 0.3 09/10/2018 1132       All questions were answered. The patient knows to call the clinic with any problems, questions or concerns. No barriers to learning was detected.  I spent 25 minutes counseling the patient face to face. The total time spent in the appointment was 30 minutes and more than 50% was on counseling and review of test results  Heath Lark, MD 09/10/2018 1:40 PM

## 2018-09-11 ENCOUNTER — Telehealth (INDEPENDENT_AMBULATORY_CARE_PROVIDER_SITE_OTHER): Payer: PPO | Admitting: Family Medicine

## 2018-09-11 DIAGNOSIS — R195 Other fecal abnormalities: Secondary | ICD-10-CM | POA: Diagnosis not present

## 2018-09-11 DIAGNOSIS — K6289 Other specified diseases of anus and rectum: Secondary | ICD-10-CM

## 2018-09-11 DIAGNOSIS — F419 Anxiety disorder, unspecified: Secondary | ICD-10-CM | POA: Diagnosis not present

## 2018-09-11 MED ORDER — DILTIAZEM GEL 2 %: 1.0000 "application " | Freq: Two times a day (BID) | CUTANEOUS | 0 refills | Status: DC

## 2018-09-11 NOTE — Progress Notes (Signed)
Virtual Visit via Video Note   I connected with Haley [facility] on 09/11/18 by a video enabled telemedicine application and verified that I am speaking with the correct person using two identifiers.  Location patient: home Location provider:work office Persons participating in the virtual visit: patient, provider  I discussed the limitations of evaluation and management by telemedicine and the availability of in person appointments. The patient expressed understanding and agreed to proceed.   HPI: Haley [name] is a 73 yo female with Hx of anxiety,tobaccomuse disorder,and ovarian cancer on chemotherapy is c/o rectal pain that started over a months ago. Pain has been exacerbated by bulky bowel movements, "terrible pain" while passing stool.She has Hx of constipation and was "very constipated" a few weeks ago but she feels like this is not a problem now, having "very firm and big" stool. She is on Colace daily. Miralax caused diarrhea.  She denies abdominal pain, no more nausea than usual (chemo),vomiting.  Last seen on 03/14/18,when she was c/o "hemorrhoids bleeding",no dyschezia, Anusol was recommended. She was going to use medication this time again and states that she was told not to use it. Also she was schedule to have chemo yesterday but was cancelled because rectal pain.  She is also having blood on tissue after defecation. She has not noted perianal masses or nodules. States that she does not feel it is constipation,althopugh she was very constipated a few weeks ago.  Colonoscopy last done 10/2012:Mild diverticulosis ascending colon and internal hemorrhoids.  Requesting refills on Alprazolam 0.5 mg,which she takes daily as needed. Medication still helping with acute anxiety and at night when she cannot sleep. No side effects.  Problem has been stable despite the fact she was recently diagnosed with ovarian cancer. No depressed mood.  ROS: See pertinent positives and negatives per  HPI.  Past Medical History:  Diagnosis Date  . Allergy    SEASONAL  . Anxiety   . Cataract    BILATERAL  . Dysrhythmia   . Family history of lung cancer   . Family history of prostate cancer   . Family history of prostate cancer   . Family history of uterine cancer   . Fibromyalgia   . GERD (gastroesophageal reflux disease)   . H/O echocardiogram 04/28/08   EF>55% trace mitral regurgitation, No significant valvular pathology  . Hemorrhoids    internal  . History of stress test 02/08/2011   Normal Myocardial perfusion study, this is a low risk scan, No prior study available for comparison  . Hyperlipidemia   . Hypertension    hx of  . Hypothyroidism   . MVP (mitral valve prolapse)    mild and MR  . OA (osteoarthritis)   . Osteoporosis   . ovarian ca dx'd 12/2017   ovarian cancer  . Paroxysmal A-fib ([place])   . Sleep apnea    does not use prescribed CPAP  does not tolerate  . Thyroid disease    HYPO  . Varicosities of leg   . Vitamin D deficiency     Past Surgical History:  Procedure Laterality Date  . ABDOMINAL HYSTERECTOMY  1989  . APPENDECTOMY  1989   with hysterectomy  . BILATERAL SALPINGECTOMY N/A 03/27/2018   Procedure: EXPLORATORY LAPARATOMY, BILATERAL SALPINGOOPHERECTOMY;  Surgeon: Everitt Amber, MD;  Location: [facility];  Service: Gynecology;  Laterality: N/A;  . BREAST EXCISIONAL BIOPSY Left 1995   Benign  . COLONOSCOPY  11-20-2000  . DEBULKING N/A 03/27/2018   Procedure: RADICAL TUMOR DEBULKING;  Surgeon: Everitt Amber, MD;  Location: [facility];  Service: Gynecology;  Laterality: N/A;  . IR IMAGING GUIDED PORT INSERTION  01/17/2018  . NOSE SURGERY  1990  . OMENTECTOMY N/A 03/27/2018   Procedure: OMENTECTOMY;  Surgeon: Everitt Amber, MD;  Location: [facility];  Service: Gynecology;  Laterality: N/A;    Family History  Problem Relation Age of Onset  . Diabetes Daughter   . Diabetes Mother   . Hypertension Mother   . Heart disease Mother   . Stroke Maternal Grandmother    . Lung cancer Niece   . Cancer Paternal Aunt        type of cancer unk  . Prostate cancer Paternal Uncle   . Uterine cancer Cousin        dx under 35  . Colon cancer Neg Hx     Social History   Socioeconomic History  . Marital status: Married    Spouse name: Not on file  . Number of children: Not on file  . Years of education: Not on file  . Highest education level: Not on file  Occupational History  . Not on file  Social Needs  . Financial resource strain: Not on file  . Food insecurity    Worry: Not on file    Inability: Not on file  . Transportation needs    Medical: Not on file    Non-medical: Not on file  Tobacco Use  . Smoking status: Light Tobacco Smoker    Years: 10.00    Types: Cigars  . Smokeless tobacco: Never Used  . Tobacco comment: not daily  [place] cigars 1-2   Substance and Sexual Activity  . Alcohol use: No    Alcohol/week: 0.0 standard drinks  . Drug use: No  . Sexual activity: Yes    Comment: 1st intercourse 17yo-1 partner  Lifestyle  . Physical activity    Days per week: Not on file    Minutes per session: Not on file  . Stress: Not on file  Relationships  . Social Herbalist on phone: Not on file    Gets together: Not on file    Attends religious service: Not on file    Active member of club or organization: Not on file    Attends meetings of clubs or organizations: Not on file    Relationship status: Not on file  . Intimate partner violence    Fear of current or ex partner: Not on file    Emotionally abused: Not on file    Physically abused: Not on file    Forced sexual activity: Not on file  Other Topics Concern  . Not on file  Social History Narrative  . Not on file    Current Outpatient Medications:  .  acetaminophen (TYLENOL) 500 MG tablet, Take 2 tablets (1,000 mg total) by mouth every 6 (six) hours. (Patient taking differently: Take 1,000 mg by mouth every 6 (six) hours as needed. ), Disp: 30 tablet, Rfl: 0 .   ALPRAZolam (XANAX) 0.5 MG tablet, Take 0.5-1 tablets (0.25-0.5 mg total) by mouth daily as needed for anxiety., Disp: 30 tablet, Rfl: 2 .  alum & mag hydroxide-simeth (MAALOX/MYLANTA) 200-200-20 MG/5ML suspension, Take 30 mLs by mouth every 6 (six) hours as needed for indigestion or heartburn., Disp: 355 mL, Rfl: 0 .  amLODipine (NORVASC) 10 MG tablet, Take 1 tablet (10 mg total) by mouth daily., Disp: 90 tablet, Rfl: 3 .  Calcium Carbonate (CALTRATE 600) 1500  MG TABS, Take 600 mg of elemental calcium by mouth daily. , Disp: , Rfl:  .  cholecalciferol (VITAMIN D) 1000 UNITS tablet, Take 1,000 Units by mouth daily. , Disp: , Rfl:  .  diltiazem 2 % GEL, Apply 1 application topically 2 (two) times daily., Disp: 30 g, Rfl: 0 .  fluticasone (FLONASE) 50 MCG/ACT nasal spray, Use 2 sprays in each nostril daily (Patient taking differently: Place 2 sprays into both nostrils daily. ), Disp: 48 g, Rfl: 2 .  hydrochlorothiazide (HYDRODIURIL) 25 MG tablet, Take 1 tablet (25 mg total) by mouth daily., Disp: 90 tablet, Rfl: 1 .  ibuprofen (ADVIL,MOTRIN) 200 MG tablet, Take 600 mg by mouth every 6 (six) hours as needed for moderate pain. , Disp: , Rfl:  .  lactose free nutrition (BOOST PLUS) LIQD, Take 237 mLs by mouth daily. (Patient taking differently: Take 237 mLs by mouth 2 (two) times daily between meals. ), Disp: 10 Can, Rfl: 0 .  levothyroxine (SYNTHROID) 75 MCG tablet, Take 1 tablet (75 mcg total) by mouth daily., Disp: 90 tablet, Rfl: 3 .  lidocaine-prilocaine (EMLA) cream, Apply to affected area once, Disp: 30 g, Rfl: 3 .  Magnesium 400 MG TABS, Take 250 mg by mouth daily., Disp: , Rfl:  .  Menthol-Methyl Salicylate (MUSCLE RUB) 10-15 % CREA, Apply 1 application topically as needed for muscle pain., Disp: , Rfl:  .  metoprolol succinate (TOPROL-XL) 50 MG 24 hr tablet, Take 1 tablet (50 mg total) by mouth daily. Take with or immediately following a meal., Disp: 90 tablet, Rfl: 3 .  Multiple Vitamins-Minerals  (MULTIVITAMIN WITH MINERALS) tablet, Take 1 tablet by mouth daily.  , Disp: , Rfl:  .  Omega-3 Fatty Acids (FISH OIL) 1000 MG CAPS, Take 2,000 mg by mouth 2 (two) times daily. , Disp: , Rfl:  .  omeprazole (PRILOSEC) 40 MG capsule, Take 1 capsule (40 mg total) by mouth daily., Disp: 90 capsule, Rfl: 3 .  ondansetron (ZOFRAN) 8 MG tablet, Take 1 tablet (8 mg total) by mouth every 8 (eight) hours as needed (Nausea or vomiting)., Disp: 30 tablet, Rfl: 1 .  oxyCODONE (OXY IR/ROXICODONE) 5 MG immediate release tablet, Take 1 tablet (5 mg total) by mouth every 6 (six) hours as needed for severe pain., Disp: 30 tablet, Rfl: 0 .  polyethylene glycol (MIRALAX / GLYCOLAX) packet, Take 17 g by mouth daily., Disp: 14 each, Rfl: 0 .  prochlorperazine (COMPAZINE) 10 MG tablet, Take 1 tablet (10 mg total) by mouth every 6 (six) hours as needed (Nausea or vomiting)., Disp: 30 tablet, Rfl: 1 .  rivaroxaban (XARELTO) 20 MG TABS tablet, Take 1 tablet (20 mg total) by mouth daily with supper., Disp: 14 tablet, Rfl: 0 .  senna (SENOKOT) 8.6 MG TABS tablet, Take 1 tablet (8.6 mg total) by mouth at bedtime as needed for mild constipation., Disp: 120 each, Rfl: 0 .  senna-docusate (SENOKOT-S) 8.6-50 MG tablet, Take 2 tablets by mouth at bedtime. For AFTER surgery (Patient not taking: Reported on 09/04/2018), Disp: 30 tablet, Rfl: 0 .  simvastatin (ZOCOR) 20 MG tablet, Take 1 tablet (20 mg total) by mouth at bedtime., Disp: 90 tablet, Rfl: 3 .  vitamin C (ASCORBIC ACID) 500 MG tablet, Take 1,000 mg by mouth daily., Disp: , Rfl:   EXAM:  VITALS per patient if applicable:N/A  GENERAL: alert, oriented, appears well and in no acute distress  HEENT: atraumatic, conjunttiva clear, no obvious abnormalities on inspection.  NECK: normal movements  of the head and neck  LUNGS: on inspection no signs of respiratory distress, breathing rate appears normal, no obvious gross SOB, gasping or wheezing  CV: no obvious cyanosis  Haley:  moves all visible extremities without noticeable abnormality  PSYCH/NEURO: pleasant and cooperative, no obvious depression or anxiety, speech and thought processing grossly intact  ASSESSMENT AND PLAN:  Discussed the following assessment and plan:  Rectal pain We discussed possible etiologies. Explained that it is difficult to know exactly what is causing problem without appropriate examination. External hemorrhoids vs anal fissure. Given the severe pain she is reporting will treat as anal fissure with topical Diltiazem 2% gel. Avoid straining/constipation. Instructed about warning signs. F/U in 10-14 days if problem do not resolve.   Anxiety - Plan: ALPRAZolam (XANAX) 0.5 MG tablet Problem is stable. No changes in Alprazolam dose. F/U in 6 months.  Bulky stools Adequate fluid intake. Miralax 1/2 dose daily prn. Glicerine suppositories may also help.  I discussed the assessment and treatment plan with the patient. The patient was provided an opportunity to ask questions and all were answered. The patient agreed with the plan and demonstrated an understanding of the instructions.   The patient was advised to call back or seek an in-person evaluation if the symptoms worsen or if the condition fails to improve as anticipated.  Return in about 10 days (around 09/21/2018) for In office..    Tineshia Martinique, MD

## 2018-09-12 ENCOUNTER — Telehealth: Payer: Self-pay | Admitting: *Deleted

## 2018-09-12 ENCOUNTER — Ambulatory Visit: Payer: PPO | Admitting: Family Medicine

## 2018-09-12 MED ORDER — DILTIAZEM GEL 2 %: 1.0000 "application " | Freq: Two times a day (BID) | CUTANEOUS | 0 refills | Status: DC

## 2018-09-12 NOTE — Telephone Encounter (Signed)
Please advise.  Copied from [place] [id number]. Topic: General - Other >> Sep 12, 2018  3:30 PM Haley [name] wrote: Reason for CRM: pt had virtual visit yesterday and  the  medication that Dr Martinique was suppose to call wasn't. Pt didn't know names of the 2 medications. Please call pt to advise.

## 2018-09-13 MED ORDER — ALPRAZOLAM 0.5 MG PO TABS: 0.2500 mg | ORAL_TABLET | Freq: Every day | ORAL | 2 refills | Status: DC | PRN

## 2018-09-14 ENCOUNTER — Telehealth: Payer: Self-pay | Admitting: Family Medicine

## 2018-09-14 NOTE — Telephone Encounter (Signed)
Pt states that a cream that was supposed to be called in for her to [facility].  Pt states when she went to get it they told her they do not compound medications.  Pt states that the cream is for her rectum. (Pt isn't sure what the name of the medication is). Pt also wants to know when she is supposed to have a follow up.  Pt wants to know what she needs to do.

## 2018-09-14 NOTE — Telephone Encounter (Signed)
See request °

## 2018-09-18 NOTE — Telephone Encounter (Signed)
Pt was called back and message was left on VM that medication was sent to [facility] and to call back if unable to get. Pt was also told per last MD note to follow up in 10-14 days if not any better

## 2018-09-19 ENCOUNTER — Other Ambulatory Visit: Payer: Self-pay

## 2018-09-19 MED ORDER — DILTIAZEM GEL 2 %: 1.0000 "application " | Freq: Two times a day (BID) | CUTANEOUS | 0 refills | Status: DC

## 2018-09-19 NOTE — Telephone Encounter (Unsigned)
Copied from [place] [id number]. Topic: General - Other >> Sep 19, 2018  3:06 PM Carolyn Stare wrote: Pt call to say she contacted the pharmacy about the below medication and they told her they did not have a Rx for that medication   diltiazem 2 % GEL

## 2018-09-20 NOTE — Telephone Encounter (Signed)
Pt states that her insurance doesn't cover this medication.  Pt states that she hasn't had pain for the past week and wants to know if she still needs to get it.

## 2018-09-24 ENCOUNTER — Inpatient Hospital Stay: Payer: PPO

## 2018-09-24 ENCOUNTER — Other Ambulatory Visit: Payer: Self-pay

## 2018-09-24 ENCOUNTER — Inpatient Hospital Stay: Payer: PPO | Attending: Obstetrics | Admitting: Hematology and Oncology

## 2018-09-24 VITALS — BP 124/70

## 2018-09-24 DIAGNOSIS — Z7901 Long term (current) use of anticoagulants: Secondary | ICD-10-CM | POA: Insufficient documentation

## 2018-09-24 DIAGNOSIS — Z7189 Other specified counseling: Secondary | ICD-10-CM

## 2018-09-24 DIAGNOSIS — I10 Essential (primary) hypertension: Secondary | ICD-10-CM | POA: Diagnosis not present

## 2018-09-24 DIAGNOSIS — C786 Secondary malignant neoplasm of retroperitoneum and peritoneum: Secondary | ICD-10-CM | POA: Insufficient documentation

## 2018-09-24 DIAGNOSIS — D61818 Other pancytopenia: Secondary | ICD-10-CM | POA: Diagnosis not present

## 2018-09-24 DIAGNOSIS — Z79899 Other long term (current) drug therapy: Secondary | ICD-10-CM | POA: Diagnosis not present

## 2018-09-24 DIAGNOSIS — Z5112 Encounter for antineoplastic immunotherapy: Secondary | ICD-10-CM | POA: Insufficient documentation

## 2018-09-24 DIAGNOSIS — R599 Enlarged lymph nodes, unspecified: Secondary | ICD-10-CM | POA: Diagnosis not present

## 2018-09-24 DIAGNOSIS — C569 Malignant neoplasm of unspecified ovary: Secondary | ICD-10-CM

## 2018-09-24 DIAGNOSIS — Z5111 Encounter for antineoplastic chemotherapy: Secondary | ICD-10-CM | POA: Diagnosis not present

## 2018-09-24 DIAGNOSIS — C561 Malignant neoplasm of right ovary: Secondary | ICD-10-CM

## 2018-09-24 DIAGNOSIS — M79606 Pain in leg, unspecified: Secondary | ICD-10-CM

## 2018-09-24 DIAGNOSIS — R188 Other ascites: Secondary | ICD-10-CM | POA: Insufficient documentation

## 2018-09-24 LAB — CMP (CANCER CENTER ONLY)
ALT: 11 U/L (ref 0–44)
AST: 16 U/L (ref 15–41)
Albumin: 3.6 g/dL (ref 3.5–5.0)
Alkaline Phosphatase: 84 U/L (ref 38–126)
Anion gap: 8 (ref 5–15)
BUN: 11 mg/dL (ref 8–23)
CO2: 28 mmol/L (ref 22–32)
Calcium: 9.4 mg/dL (ref 8.9–10.3)
Chloride: 101 mmol/L (ref 98–111)
Creatinine: 0.73 mg/dL (ref 0.44–1.00)
GFR, Est AFR Am: >60 mL/min (ref >60)
GFR, Estimated: >60 mL/min (ref >60)
Glucose, Bld: 72 mg/dL (ref 70–99)
Potassium: 4 mmol/L (ref 3.5–5.1)
Sodium: 137 mmol/L (ref 135–145)
Total Bilirubin: 0.4 mg/dL (ref 0.3–1.2)
Total Protein: 6.9 g/dL (ref 6.5–8.1)

## 2018-09-24 LAB — CBC WITH DIFFERENTIAL (CANCER CENTER ONLY)
Abs Immature Granulocytes: 0.05 10*3/uL (ref 0.00–0.07)
Basophils Absolute: 0.1 10*3/uL (ref 0.0–0.1)
Basophils Relative: 1 %
Eosinophils Absolute: 0.5 10*3/uL (ref 0.0–0.5)
Eosinophils Relative: 7 %
HCT: 36.6 % (ref 36.0–46.0)
Hemoglobin: 12 g/dL (ref 12.0–15.0)
Immature Granulocytes: 1 %
Lymphocytes Relative: 31 %
Lymphs Abs: 2.1 10*3/uL (ref 0.7–4.0)
MCH: 30.8 pg (ref 26.0–34.0)
MCHC: 32.8 g/dL (ref 30.0–36.0)
MCV: 94.1 fL (ref 80.0–100.0)
Monocytes Absolute: 0.7 10*3/uL (ref 0.1–1.0)
Monocytes Relative: 11 %
Neutro Abs: 3.3 10*3/uL (ref 1.7–7.7)
Neutrophils Relative %: 49 %
Platelet Count: 138 10*3/uL — ABNORMAL LOW (ref 150–400)
RBC: 3.89 MIL/uL (ref 3.87–5.11)
RDW: 13.6 % (ref 11.5–15.5)
WBC Count: 6.8 10*3/uL (ref 4.0–10.5)
nRBC: 0 % (ref 0.0–0.2)

## 2018-09-24 LAB — TOTAL PROTEIN, URINE DIPSTICK: Protein, ur: NEGATIVE mg/dL

## 2018-09-24 MED ORDER — SODIUM CHLORIDE 0.9% FLUSH
10.0000 mL | INTRAVENOUS | Status: DC | PRN
  Administered 2018-09-24: 10 mL
  Filled 2018-09-24: qty 10

## 2018-09-24 MED ORDER — DEXTROSE 5 % IV SOLN
Freq: Once | INTRAVENOUS | Status: AC
  Administered 2018-09-24: 16:00:00 via INTRAVENOUS
  Filled 2018-09-24: qty 250

## 2018-09-24 MED ORDER — DEXAMETHASONE SODIUM PHOSPHATE 10 MG/ML IJ SOLN
INTRAMUSCULAR | Status: AC
  Filled 2018-09-24: qty 1

## 2018-09-24 MED ORDER — HEPARIN SOD (PORK) LOCK FLUSH 100 UNIT/ML IV SOLN
500.0000 [IU] | Freq: Once | INTRAVENOUS | Status: AC | PRN
  Administered 2018-09-24: 500 [IU]
  Filled 2018-09-24: qty 5

## 2018-09-24 MED ORDER — SODIUM CHLORIDE 0.9 % IV SOLN
Freq: Once | INTRAVENOUS | Status: AC
  Administered 2018-09-24: 14:00:00 via INTRAVENOUS
  Filled 2018-09-24: qty 250

## 2018-09-24 MED ORDER — DOXORUBICIN HCL LIPOSOMAL CHEMO INJECTION 2 MG/ML
37.0000 mg/m2 | Freq: Once | INTRAVENOUS | Status: AC
  Administered 2018-09-24: 60 mg via INTRAVENOUS
  Filled 2018-09-24: qty 30

## 2018-09-24 MED ORDER — SODIUM CHLORIDE 0.9% FLUSH
10.0000 mL | Freq: Once | INTRAVENOUS | Status: AC
  Administered 2018-09-24: 10 mL
  Filled 2018-09-24: qty 10

## 2018-09-24 MED ORDER — DEXAMETHASONE SODIUM PHOSPHATE 10 MG/ML IJ SOLN
10.0000 mg | Freq: Once | INTRAMUSCULAR | Status: AC
  Administered 2018-09-24: 10 mg via INTRAVENOUS

## 2018-09-24 MED ORDER — SODIUM CHLORIDE 0.9 % IV SOLN
10.0000 mg/kg | Freq: Once | INTRAVENOUS | Status: AC
  Administered 2018-09-24: 600 mg via INTRAVENOUS
  Filled 2018-09-24: qty 16

## 2018-09-24 NOTE — Patient Instructions (Signed)
[facility] Discharge Instructions for Patients Receiving Chemotherapy  Today you received the following chemotherapy agents : Bevacizumab (AVASTIN) & Doxorubicin (DOXIL).  To help prevent nausea and vomiting after your treatment, we encourage you to take your nausea medication as prescribed.  Do  Drink lots of fluids  As  Tolerated.  Do Not  Eat  Greasy  Nor  Spicy  Foods.   If you develop nausea and vomiting that is not controlled by your nausea medication, call the clinic.   BELOW ARE SYMPTOMS THAT SHOULD BE REPORTED IMMEDIATELY:  *FEVER GREATER THAN 100.5 F  *CHILLS WITH OR WITHOUT FEVER  NAUSEA AND VOMITING THAT IS NOT CONTROLLED WITH YOUR NAUSEA MEDICATION  *UNUSUAL SHORTNESS OF BREATH  *UNUSUAL BRUISING OR BLEEDING  TENDERNESS IN MOUTH AND THROAT WITH OR WITHOUT PRESENCE OF ULCERS  *URINARY PROBLEMS  *BOWEL PROBLEMS  UNUSUAL RASH Items with * indicate a potential emergency and should be followed up as soon as possible.  Feel free to call the clinic should you have any questions or concerns. The clinic phone number is [phone].  Please show the [facility] at check-in to the Emergency Department and triage nurse.  Bevacizumab injection What is this medicine? BEVACIZUMAB (be va SIZ yoo mab) is a monoclonal antibody. It is used to treat many types of cancer. This medicine may be used for other purposes; ask your health care provider or pharmacist if you have questions. COMMON BRAND NAME(S): Avastin, MVASI, Zirabev What should I tell my health care provider before I take this medicine? They need to know if you have any of these conditions:  diabetes  heart disease  high blood pressure  history of coughing up blood  prior anthracycline chemotherapy (e.g., doxorubicin, daunorubicin, epirubicin)  recent or ongoing radiation therapy  recent or planning to have surgery  stroke  an unusual or allergic reaction to bevacizumab, hamster  proteins, mouse proteins, other medicines, foods, dyes, or preservatives  pregnant or trying to get pregnant  breast-feeding How should I use this medicine? This medicine is for infusion into a vein. It is given by a health care professional in a hospital or clinic setting. Talk to your pediatrician regarding the use of this medicine in children. Special care may be needed. Overdosage: If you think you have taken too much of this medicine contact a poison control center or emergency room at once. NOTE: This medicine is only for you. Do not share this medicine with others. What if I miss a dose? It is important not to miss your dose. Call your doctor or health care professional if you are unable to keep an appointment. What may interact with this medicine? Interactions are not expected. This list may not describe all possible interactions. Give your health care provider a list of all the medicines, herbs, non-prescription drugs, or dietary supplements you use. Also tell them if you smoke, drink alcohol, or use illegal drugs. Some items may interact with your medicine. What should I watch for while using this medicine? Your condition will be monitored carefully while you are receiving this medicine. You will need important blood work and urine testing done while you are taking this medicine. This medicine may increase your risk to bruise or bleed. Call your doctor or health care professional if you notice any unusual bleeding. This medicine should be started at least 28 days following major surgery and the site of the surgery should be totally healed. Check with your doctor before scheduling  dental work or surgery while you are receiving this treatment. Talk to your doctor if you have recently had surgery or if you have a wound that has not healed. Do not become pregnant while taking this medicine or for 6 months after stopping it. Women should inform their doctor if they wish to become pregnant or  think they might be pregnant. There is a potential for serious side effects to an unborn child. Talk to your health care professional or pharmacist for more information. Do not breast-feed an infant while taking this medicine and for 6 months after the last dose. This medicine has caused ovarian failure in some women. This medicine may interfere with the ability to have a child. You should talk to your doctor or health care professional if you are concerned about your fertility. What side effects may I notice from receiving this medicine? Side effects that you should report to your doctor or health care professional as soon as possible:  allergic reactions like skin rash, itching or hives, swelling of the face, lips, or tongue  chest pain or chest tightness  chills  coughing up blood  high fever  seizures  severe constipation  signs and symptoms of bleeding such as bloody or black, tarry stools; red or dark-brown urine; spitting up blood or brown material that looks like coffee grounds; red spots on the skin; unusual bruising or bleeding from the eye, gums, or nose  signs and symptoms of a blood clot such as breathing problems; chest pain; severe, sudden headache; pain, swelling, warmth in the leg  signs and symptoms of a stroke like changes in vision; confusion; trouble speaking or understanding; severe headaches; sudden numbness or weakness of the face, arm or leg; trouble walking; dizziness; loss of balance or coordination  stomach pain  sweating  swelling of legs or ankles  vomiting  weight gain Side effects that usually do not require medical attention (report to your doctor or health care professional if they continue or are bothersome):  back pain  changes in taste  decreased appetite  dry skin  nausea  tiredness This list may not describe all possible side effects. Call your doctor for medical advice about side effects. You may report side effects to FDA at  1-800-FDA-1088. Where should I keep my medicine? This drug is given in a hospital or clinic and will not be stored at home. NOTE: This sheet is a summary. It may not cover all possible information. If you have questions about this medicine, talk to your doctor, pharmacist, or health care provider.  2020 Elsevier/Gold Standard (2015-12-25 14:33:29)  Doxorubicin Liposomal injection What is this medicine? LIPOSOMAL DOXORUBICIN (LIP oh som al dox oh ROO bi sin) is a chemotherapy drug. This medicine is used to treat many kinds of cancer like Kaposi's sarcoma, multiple myeloma, and ovarian cancer. This medicine may be used for other purposes; ask your health care provider or pharmacist if you have questions. COMMON BRAND NAME(S): Doxil, Lipodox What should I tell my health care provider before I take this medicine? They need to know if you have any of these conditions:  blood disorders  heart disease  infection (especially a virus infection such as chickenpox, cold sores, or herpes)  liver disease  recent or ongoing radiation therapy  an unusual or allergic reaction to doxorubicin, other chemotherapy agents, soybeans, other medicines, foods, dyes, or preservatives  pregnant or trying to get pregnant  breast-feeding How should I use this medicine? This drug is given  as an infusion into a vein. It is administered in a hospital or clinic by a specially trained health care professional. If you have pain, swelling, burning or any unusual feeling around the site of your injection, tell your health care professional right away. Talk to your pediatrician regarding the use of this medicine in children. Special care may be needed. Overdosage: If you think you have taken too much of this medicine contact a poison control center or emergency room at once. NOTE: This medicine is only for you. Do not share this medicine with others. What if I miss a dose? It is important not to miss your dose. Call  your doctor or health care professional if you are unable to keep an appointment. What may interact with this medicine? Do not take this medicine with any of the following medications:  zidovudine This medicine may also interact with the following medications:  medicines to increase blood counts like filgrastim, pegfilgrastim, sargramostim  vaccines Talk to your doctor or health care professional before taking any of these medicines:  acetaminophen  aspirin  ibuprofen  ketoprofen  naproxen This list may not describe all possible interactions. Give your health care provider a list of all the medicines, herbs, non-prescription drugs, or dietary supplements you use. Also tell them if you smoke, drink alcohol, or use illegal drugs. Some items may interact with your medicine. What should I watch for while using this medicine? Your condition will be monitored carefully while you are receiving this medicine. You may need blood work done while you are taking this medicine. This drug may make you feel generally unwell. This is not uncommon, as chemotherapy can affect healthy cells as well as cancer cells. Report any side effects. Continue your course of treatment even though you feel ill unless your doctor tells you to stop. Your urine may turn orange-red for a few days after your dose. This is not blood. If your urine is dark or brown, call your doctor. In some cases, you may be given additional medicines to help with side effects. Follow all directions for their use. Talk to your doctor about your risk of cancer. You may be more at risk for certain types of cancers if you take this medicine. Do not become pregnant while taking this medicine or for 6 months after stopping it. Women should inform their healthcare professional if they wish to become pregnant or think they may be pregnant. Men should not father a child while taking this medicine and for 6 months after stopping it. There is a  potential for serious side effects to an unborn child. Talk to your health care professional or pharmacist for more information. Do not breast-feed an infant while taking this medicine. This medicine has caused ovarian failure in some women. This medicine may make it more difficult to get pregnant. Talk to your healthcare professional if you are concerned about your fertility. This medicine has caused decreased sperm counts in some men. This may make it more difficult to father a child. Talk to your healthcare professional if you are concerned about your fertility. This medicine may cause a decrease in Co-Enzyme Q-10. You should make sure that you get enough Co-Enzyme Q-10 while you are taking this medicine. Discuss the foods you eat and the vitamins you take with your health care professional. What side effects may I notice from receiving this medicine? Side effects that you should report to your doctor or health care professional as soon as possible:  allergic  reactions like skin rash, itching or hives, swelling of the face, lips, or tongue  low blood counts - this medicine may decrease the number of white blood cells, red blood cells and platelets. You may be at increased risk for infections and bleeding.  signs of hand-foot syndrome - tingling or burning, redness, flaking, swelling, small blisters, or small sores on the palms of your hands or the soles of your feet  signs of infection - fever or chills, cough, sore throat, pain or difficulty passing urine  signs of decreased platelets or bleeding - bruising, pinpoint red spots on the skin, black, tarry stools, blood in the urine  signs of decreased red blood cells - unusually weak or tired, fainting spells, lightheadedness  back pain, chills, facial flushing, fever, headache, tightness in the chest or throat during the infusion  breathing problems  chest pain  fast, irregular heartbeat  mouth pain, redness, sores  pain, swelling,  redness at site where injected  pain, tingling, numbness in the hands or feet  swelling of ankles, feet, or hands  vomiting Side effects that usually do not require medical attention (report to your doctor or health care professional if they continue or are bothersome):  diarrhea  hair loss  loss of appetite  nail discoloration or damage  nausea  red or watery eyes  red colored urine  stomach upset This list may not describe all possible side effects. Call your doctor for medical advice about side effects. You may report side effects to FDA at 1-800-FDA-1088. Where should I keep my medicine? This drug is given in a hospital or clinic and will not be stored at home. NOTE: This sheet is a summary. It may not cover all possible information. If you have questions about this medicine, talk to your doctor, pharmacist, or health care provider.  2020 Elsevier/Gold Standard (2017-09-04 15:13:26)   Coronavirus (COVID-19) Are you at risk?  Are you at risk for the Coronavirus (COVID-19)?  To be considered HIGH RISK for Coronavirus (COVID-19), you have to meet the following criteria:  . Traveled to [place], [place], [place], [place] or [place]; or in the [place] to [place], [place], [place], or [place]; and have fever, cough, and shortness of breath within the last 2 weeks of travel OR . Been in close contact with a person diagnosed with COVID-19 within the last 2 weeks and have fever, cough, and shortness of breath . IF YOU DO NOT MEET THESE CRITERIA, YOU ARE CONSIDERED LOW RISK FOR COVID-19.  What to do if you are HIGH RISK for COVID-19?  Marland Kitchen If you are having a medical emergency, call 911. . Seek medical care right away. Before you go to a doctor's office, urgent care or emergency department, call ahead and tell them about your recent travel, contact with someone diagnosed with COVID-19, and your symptoms. You should receive instructions from your physician's office  regarding next steps of care.  . When you arrive at healthcare provider, tell the healthcare staff immediately you have returned from visiting [place], [place], [place], [place] or [place]; or traveled in the [place] to [employer], [place], [place], or [place]; in the last two weeks or you have been in close contact with a person diagnosed with COVID-19 in the last 2 weeks.   . Tell the health care staff about your symptoms: fever, cough and shortness of breath. . After you have been seen by a medical provider, you will be either: o  Tested for (COVID-19) and discharged home on quarantine except to seek medical care if symptoms worsen, and asked to  - Stay home and avoid contact with others until you get your results (4-5 days)  - Avoid travel on public transportation if possible (such as bus, train, or airplane) or o Sent to the Emergency Department by EMS for evaluation, COVID-19 testing, and possible admission depending on your condition and test results.  What to do if you are LOW RISK for COVID-19?  Reduce your risk of any infection by using the same precautions used for avoiding the common cold or flu:  Marland Kitchen Wash your hands often with soap and warm water for at least 20 seconds.  If soap and water are not readily available, use an alcohol-based hand sanitizer with at least 60% alcohol.  . If coughing or sneezing, cover your mouth and nose by coughing or sneezing into the elbow areas of your shirt or coat, into a tissue or into your sleeve (not your hands). . Avoid shaking hands with others and consider head nods or verbal greetings only. . Avoid touching your eyes, nose, or mouth with unwashed hands.  . Avoid close contact with people who are sick. . Avoid places or events with large numbers of people in one location, like concerts or sporting events. . Carefully consider travel plans you have or are making. . If you are planning any travel outside or inside the [place], visit the CDC's  Travelers' Health webpage for the latest health notices. . If you have some symptoms but not all symptoms, continue to monitor at home and seek medical attention if your symptoms worsen. . If you are having a medical emergency, call 911.   [place] / e-Visit: [email or web address]         [facility]: [facility]: [phone]                   [facility]: [phone]

## 2018-09-25 LAB — CA 125: Cancer Antigen (CA) 125: 142 U/mL — ABNORMAL HIGH (ref 0.0–38.1)

## 2018-09-26 ENCOUNTER — Encounter: Payer: Self-pay | Admitting: Hematology and Oncology

## 2018-09-26 NOTE — Assessment & Plan Note (Signed)
She tolerated treatment well with expected side effects such as mild pancytopenia and hypertension She is scheduled for CT imaging for objective assessment of response to treatment Her tumor marker continues to rise and I am concerned that she might have refractory disease I will see her back in 2 weeks for further follow-up

## 2018-09-26 NOTE — Telephone Encounter (Signed)
If pain has resolved,she does not need to start medication. Thanks, BJ

## 2018-09-26 NOTE — Assessment & Plan Note (Signed)
She has mild intermittent thrombocytopenia likely secondary to her treatment She is not symptomatic Observe only for now.

## 2018-09-26 NOTE — Assessment & Plan Note (Signed)
Her blood pressure control is satisfactory She will continue current prescription anti-hypertensives

## 2018-09-26 NOTE — Progress Notes (Signed)
[name] OFFICE PROGRESS NOTE  Patient Care Team: [place], Jaskirat G, MD as PCP - General (Family Medicine) Troy Sine, MD as PCP - Cardiology (Cardiology) Awanda Mink Craige Cotta, RN as Oncology Nurse Navigator (Oncology)  ASSESSMENT & PLAN:  Right ovarian epithelial cancer [facility]) She tolerated treatment well with expected side effects such as mild pancytopenia and hypertension She is scheduled for CT imaging for objective assessment of response to treatment Her tumor marker continues to rise and I am concerned that she might have refractory disease I will see her back in 2 weeks for further follow-up  Essential hypertension, benign Her blood pressure control is satisfactory She will continue current prescription anti-hypertensives  Pancytopenia, acquired [facility]) She has mild intermittent thrombocytopenia likely secondary to her treatment She is not symptomatic Observe only for now.   No orders of the defined types were placed in this encounter.   INTERVAL HISTORY: Please see below for problem oriented charting. She returns to resume treatment She denies further GI problems Her blood pressure at home is satisfactory No recent bleeding.  SUMMARY OF ONCOLOGIC HISTORY: Oncology History Overview Note  Neg for genetics blood work and HRD   Right ovarian epithelial cancer ([place])  01/06/2018 Imaging   Ct abdomen and pelvis 1. Complex cystic mass at the right adnexa, measuring 5.9 x 4.0 cm, with nodular components, concerning for primary ovarian malignancy. 2. Diffuse nodularity along the omentum at the left side of the abdomen, extending into the mesentery at the left mid abdomen, concerning for peritoneal carcinomatosis. 3. Wall thickening at the distal ileum adjacent to the ovarian mass; bowel loops appear somewhat adherent to the ovarian mass. Bowel infiltration with tumor cannot be excluded. No evidence of bowel obstruction at this time. 4. Small volume ascites within  the abdomen and pelvis.  Aortic Atherosclerosis (ICD10-I70.0).   01/08/2018 Tumor Marker   Patient's tumor was tested for the following markers: CA-125. Results of the tumor marker test revealed 3004   01/15/2018 Imaging   Chest CT:  1. No active cardiopulmonary disease. 2. Aortic atherosclerosis without aneurysm or dissection. 3. No large central pulmonary embolus.  CT AP:  1. Dilated fluid-filled loops of small bowel are redemonstrated slightly more extensive than on prior exam with transition point likely in the right adnexa adjacent to a complex cystic mass concerning for ovarian neoplasm given septations and soft tissue nodularity. This raises concern for early or partial SBO. This soft tissue mass measures 4.9 x 4 x 4.7 cm and has not changed since prior recent comparison. Additional short segmental area of luminal narrowing is noted in the right lower quadrant involving small bowel for which stigmata of peritoneal carcinomatosis or small-bowel metastatic implants might account for this. 2. Redemonstration of small volume of ascites predominantly in the upper abdomen surrounding the liver and spleen. 3. Redemonstration of thick bandlike omental thickening concerning for peritoneal carcinomatosis.    01/15/2018 Procedure   Successful ultrasound-guided diagnostic and therapeutic paracentesis yielding 1.5 liters of peritoneal fluid.    01/15/2018 Pathology Results   PERITONEAL/ASCITIC FLUID (SPECIMEN 1 OF 1 COLLECTED 01/18/18): MALIGNANT CELLS CONSISTENT WITH METASTATIC ADENOCARCINOMA. SEE COMMENT. COMMENT: THE MALIGNANT CELLS ARE POSITIVE FOR MOC-31, CYTOKERATIN 7, ESTROGEN RECEPTOR, PAX-8, AND WT-1. THEY ARE NEGATIVE FOR CALRETININ, CYTOKERATIN 5/6, AND CYTOKERATIN 20. THE PROFILE IS CONSISTENT WITH A PRIMARY GYNECOLOGIC CARCINOMA. THERE IS LIKELY SUFFICIENT TUMOR PRESENT, IF ADDITIONAL STUDIES ARE REQUESTED.   01/15/2018 - 02/02/2018 Hospital Admission   She was admitted to the  hospital  for SBO. She was treated with chemotherapy   01/18/2018 - 06/20/2018 Chemotherapy   The patient had carboplatin and taxol   02/08/2018 Cancer Staging   Staging form: Ovary, Fallopian Tube, and Primary Peritoneal Carcinoma, AJCC 8th Edition - Clinical: cT3, cN0, cM0 - Signed by Heath Lark, MD on 02/08/2018   02/08/2018 Tumor Marker   Patient's tumor was tested for the following markers: CA-125. Results of the tumor marker test revealed 445   03/02/2018 Tumor Marker   Patient's tumor was tested for the following markers: CA-125. Results of the tumor marker test revealed 174   03/15/2018 Imaging   6.2 cm complex cystic right ovarian mass, compatible with malignant ovarian neoplasm, mildly progressive.  Associated peritoneal disease/omental caking beneath the anterior abdominal wall, mildly improved. Prior abdominal ascites is improved/resolved.  4.7 cm cystic left ovarian mass, without overt malignant features, grossly unchanged.  Mild bilateral hydroureteronephrosis, secondary to extrinsic compression, new.  Prior small bowel obstruction has improved/resolved.    03/27/2018 Pathology Results   1. Omentum, resection for tumor - OMENTUM: - HIGH GRADE SEROUS CARCINOMA. 2. Ovary, left - LEFT OVARY: - HIGH GRADE SEROUS CARCINOMA. - LEFT FALLOPIAN TUBE: - HIGH GRADE SEROUS CARCINOMA. 3. Adnexa - ovary +/- tube, neoplastic, right - RIGHT OVARY: - HIGH GRADE SEROUS CARCINOMA, 5.5 CM. - RIGHT FALLOPIAN TUBE: - HIGH GRADE SEROUS CARCINOMA. 4. Peritoneum, biopsy, nodule - PERITONEUM: - NODULES OF HIGH GRADE SEROUS CARCINOMA. 5. Peritoneum, resection for tumor, rectosigmoid - RECTOSIGMOID PERITONEUM: - HIGH GRADE SEROUS CARCINOMA. Microscopic Comment 3. OVARY or FALLOPIAN TUBE or PRIMARY PERITONEUM: Procedure: Bilateral salpingo-oophorectomy, omentectomy and peritoneal biopsies. Specimen Integrity: N/A. Tumor Site: Right ovary. Ovarian Surface Involvement (required only  if applicable): Yes. Fallopian Tube Surface Involvement (required only if applicable): Yes. Tumor Size: 5.5 x 4.8 x 4.0 cm. Histologic Type: Serous carcinoma. Histologic Grade: High grade. Implants (required for advanced stage serous/seromucinous borderline tumors only): Omentum, left ovary and fallopian tube, rectosigmoid peritoneum and peritoneum. Other Tissue/ Organ Involvement: Left ovary and fallopian tube, omentum and rectosigmoid peritoneum. Largest Extrapelvic Peritoneal Focus (required only if applicable): 28.7 cm, omentum. Peritoneal/Ascitic Fluid: N/A. Treatment Effect (required only for high-grade serous carcinomas): Minimal. Regional Lymph Nodes: No lymph nodes submitted. Pathologic Stage Classification (pTNM, AJCC 8th Edition): pT3c, pNX. Representative Tumor Block: 3A, 3B, 3D, 3E and 29F. Comment(s): There is a 5.5 cm in greatest dimension partially cystic high grade serous carcinoma involving the right adnexal specimen and the tumor is staged as a primary right ovarian carcinoma. The carcinoma also involves the left ovary as well as the left fallopian tube, the omentum, peritoneum and the rectosigmoid peritoneal specimens.   03/27/2018 Surgery   Preoperative Diagnosis: ovarian cancer, stage IIIC, metastatic to omentum, peritoneum, serosa of intestines   Procedure(s) Performed: 1. Exploratory laparotomy with bilateral salpingo-oophorectomy, omentectomy radical tumor debulking for ovarian cancer .  Surgeon: Thereasa Solo, MD.   Specimens: Bilateral tubes / ovaries, omentum. Peritoneal nodules, rectosigmoid nodules.    Operative Findings: omental cake, miliary studding on diaphragm and bilateral paracolic gutters. Sigmoid colon densely adherent to left and right ovaries with tumor rind, ureters mildly dilated bilaterally due to retroperitoneal extension of the tumor towards ureters causing compression.    This represented an optimal cytoreduction (R1) with gross residual  disease on the diaphragm that had been ablated and a thin tumor plaque on the sigmoid colon that was ablated. No residual disease >1cm remaining   05/07/2018 Genetic Testing   Negative genetic testing on the [facility]  TumorNextHRD+ CancerNext Panel. The CancerNext gene panel offered by [employer] includes sequencing and rearrangement analysis for the following 34 genes:   APC, ATM, BARD1, BMPR1A, BRCA1, BRCA2, BRIP1, CDH1, CDK4, CDKN2A, CHEK2, DICER1, EPCAM, GREM1, HOXB13, MLH1, MRE11A, MSH2, MSH6, MUTYH, NBN, NF1, PALB2, PMS2, POLD1, POLE, PTEN, RAD50, RAD51C, RAD51D, SMAD4, SMARCA4, STK11, and TP53. Somatic genes analyzed through TumorNext-HRD: ATM, BARD1, BRCA1, BRCA2, BRIP1, CHEK2, MRE11A, NBN, PALB2, RAD51C, RAD51D. The report date is 05/07/2018.   05/11/2018 Tumor Marker   Patient's tumor was tested for the following markers: CA-125. Results of the tumor marker test revealed 60.5   07/02/2018 Imaging   1. Mild right pericardiophrenic adenopathy is mildly increased, suspicious for metastatic nodes. No abdominopelvic adenopathy. 2. Small left peritoneal soft tissue nodule adjacent to the splenic flexure and smooth left pelvic sidewall peritoneal thickening, cannot exclude residual/recurrent disease. Attention on follow-up CT advised. 3. Bilateral renal collecting system dilatation has improved. 4.  Aortic Atherosclerosis (ICD10-I70.0).   07/02/2018 Tumor Marker   Patient's tumor was tested for the following markers: CA-125 Results of the tumor marker test revealed 42.7   07/11/2018 Echocardiogram   1. The left ventricle has normal systolic function with an ejection fraction of 60-65%. The cavity size was normal. Left ventricular diastolic parameters were normal.  2. Normal GLS -20.1.  3. The right ventricle has normal systolic function. The cavity was normal. There is no increase in right ventricular wall thickness.  4. The mitral valve is degenerative. Moderate thickening of the mitral valve  leaflet. Moderate calcification of the mitral valve leaflet.  5. The aortic valve is tricuspid. Moderate thickening of the aortic valve. Moderate calcification of the aortic valve. Aortic valve regurgitation is trivial by color flow Doppler.  6. The aortic root is normal in size and structure.    Chemotherapy   The patient had doxorubicin and bevacizumab for chemotherapy treatment.     07/30/2018 Tumor Marker   Patient's tumor was tested for the following markers: CA-125 Results of the tumor marker test revealed 82.6   08/27/2018 Tumor Marker   Patient's tumor was tested for the following markers: CA-125 Results of the tumor marker test revealed 111.   09/24/2018 Tumor Marker   Patient's tumor was tested for the following markers: CA-125 Results of the tumor marker test revealed 142.     REVIEW OF SYSTEMS:   Constitutional: Denies fevers, chills or abnormal weight loss Eyes: Denies blurriness of vision Ears, nose, mouth, throat, and face: Denies mucositis or sore throat Respiratory: Denies cough, dyspnea or wheezes Cardiovascular: Denies palpitation, chest discomfort or lower extremity swelling Gastrointestinal:  Denies nausea, heartburn or change in bowel habits Skin: Denies abnormal skin rashes Lymphatics: Denies new lymphadenopathy or easy bruising Neurological:Denies numbness, tingling or new weaknesses Behavioral/Psych: Mood is stable, no new changes  All other systems were reviewed with the patient and are negative.  I have reviewed the past medical history, past surgical history, social history and family history with the patient and they are unchanged from previous note.  ALLERGIES:  is allergic to tramadol hcl.  MEDICATIONS:  Current Outpatient Medications  Medication Sig Dispense Refill  . acetaminophen (TYLENOL) 500 MG tablet Take 2 tablets (1,000 mg total) by mouth every 6 (six) hours. (Patient taking differently: Take 1,000 mg by mouth every 6 (six) hours as  needed. ) 30 tablet 0  . ALPRAZolam (XANAX) 0.5 MG tablet Take 0.5-1 tablets (0.25-0.5 mg total) by mouth daily as needed for anxiety. 30 tablet 2  .  alum & mag hydroxide-simeth (MAALOX/MYLANTA) 200-200-20 MG/5ML suspension Take 30 mLs by mouth every 6 (six) hours as needed for indigestion or heartburn. 355 mL 0  . amLODipine (NORVASC) 10 MG tablet Take 1 tablet (10 mg total) by mouth daily. 90 tablet 3  . Calcium Carbonate (CALTRATE 600) 1500 MG TABS Take 600 mg of elemental calcium by mouth daily.     . cholecalciferol (VITAMIN D) 1000 UNITS tablet Take 1,000 Units by mouth daily.     Marland Kitchen diltiazem 2 % GEL Apply 1 application topically 2 (two) times daily. 30 g 0  . fluticasone (FLONASE) 50 MCG/ACT nasal spray Use 2 sprays in each nostril daily (Patient taking differently: Place 2 sprays into both nostrils daily. ) 48 g 2  . hydrochlorothiazide (HYDRODIURIL) 25 MG tablet Take 1 tablet (25 mg total) by mouth daily. 90 tablet 1  . ibuprofen (ADVIL,MOTRIN) 200 MG tablet Take 600 mg by mouth every 6 (six) hours as needed for moderate pain.     Marland Kitchen lactose free nutrition (BOOST PLUS) LIQD Take 237 mLs by mouth daily. (Patient taking differently: Take 237 mLs by mouth 2 (two) times daily between meals. ) 10 Can 0  . levothyroxine (SYNTHROID) 75 MCG tablet Take 1 tablet (75 mcg total) by mouth daily. 90 tablet 3  . lidocaine-prilocaine (EMLA) cream Apply to affected area once 30 g 3  . Magnesium 400 MG TABS Take 250 mg by mouth daily.    . Menthol-Methyl Salicylate (MUSCLE RUB) 10-15 % CREA Apply 1 application topically as needed for muscle pain.    . metoprolol succinate (TOPROL-XL) 50 MG 24 hr tablet Take 1 tablet (50 mg total) by mouth daily. Take with or immediately following a meal. 90 tablet 3  . Multiple Vitamins-Minerals (MULTIVITAMIN WITH MINERALS) tablet Take 1 tablet by mouth daily.      . Omega-3 Fatty Acids (FISH OIL) 1000 MG CAPS Take 2,000 mg by mouth 2 (two) times daily.     Marland Kitchen omeprazole  (PRILOSEC) 40 MG capsule Take 1 capsule (40 mg total) by mouth daily. 90 capsule 3  . ondansetron (ZOFRAN) 8 MG tablet Take 1 tablet (8 mg total) by mouth every 8 (eight) hours as needed (Nausea or vomiting). 30 tablet 1  . oxyCODONE (OXY IR/ROXICODONE) 5 MG immediate release tablet Take 1 tablet (5 mg total) by mouth every 6 (six) hours as needed for severe pain. 30 tablet 0  . polyethylene glycol (MIRALAX / GLYCOLAX) packet Take 17 g by mouth daily. 14 each 0  . prochlorperazine (COMPAZINE) 10 MG tablet Take 1 tablet (10 mg total) by mouth every 6 (six) hours as needed (Nausea or vomiting). 30 tablet 1  . rivaroxaban (XARELTO) 20 MG TABS tablet Take 1 tablet (20 mg total) by mouth daily with supper. 14 tablet 0  . senna (SENOKOT) 8.6 MG TABS tablet Take 1 tablet (8.6 mg total) by mouth at bedtime as needed for mild constipation. 120 each 0  . senna-docusate (SENOKOT-S) 8.6-50 MG tablet Take 2 tablets by mouth at bedtime. For AFTER surgery (Patient not taking: Reported on 09/04/2018) 30 tablet 0  . simvastatin (ZOCOR) 20 MG tablet Take 1 tablet (20 mg total) by mouth at bedtime. 90 tablet 3  . vitamin C (ASCORBIC ACID) 500 MG tablet Take 1,000 mg by mouth daily.     No current facility-administered medications for this visit.     PHYSICAL EXAMINATION: ECOG PERFORMANCE STATUS: 1 - Symptomatic but completely ambulatory  Vitals:   09/24/18  1317  BP: (!) 115/51  Pulse: (!) 55  Resp: 18  Temp: 97.6 F (36.4 C)  SpO2: 98%   Filed Weights   09/24/18 1317  Weight: 137 lb (62.1 kg)    GENERAL:alert, no distress and comfortable SKIN: skin color, texture, turgor are normal, no rashes or significant lesions EYES: normal, Conjunctiva are pink and non-injected, sclera clear OROPHARYNX:no exudate, no erythema and lips, buccal mucosa, and tongue normal  NECK: supple, thyroid normal size, non-tender, without nodularity LYMPH:  no palpable lymphadenopathy in the cervical, axillary or  inguinal LUNGS: clear to auscultation and percussion with normal breathing effort HEART: regular rate & rhythm and no murmurs and no lower extremity edema ABDOMEN:abdomen soft, non-tender and normal bowel sounds Musculoskeletal:no cyanosis of digits and no clubbing  NEURO: alert & oriented x 3 with fluent speech, no focal motor/sensory deficits  LABORATORY DATA:  I have reviewed the data as listed    Component Value Date/Time   NA 137 09/24/2018 1225   K 4.0 09/24/2018 1225   CL 101 09/24/2018 1225   CO2 28 09/24/2018 1225   GLUCOSE 72 09/24/2018 1225   BUN 11 09/24/2018 1225   CREATININE 0.73 09/24/2018 1225   CREATININE 0.76 11/17/2015 1005   CALCIUM 9.4 09/24/2018 1225   PROT 6.9 09/24/2018 1225   ALBUMIN 3.6 09/24/2018 1225   AST 16 09/24/2018 1225   ALT 11 09/24/2018 1225   ALKPHOS 84 09/24/2018 1225   BILITOT 0.4 09/24/2018 1225   GFRNONAA >60 09/24/2018 1225   GFRAA >60 09/24/2018 1225    No results found for: SPEP, UPEP  Lab Results  Component Value Date   WBC 6.8 09/24/2018   NEUTROABS 3.3 09/24/2018   HGB 12.0 09/24/2018   HCT 36.6 09/24/2018   MCV 94.1 09/24/2018   PLT 138 (L) 09/24/2018      Chemistry      Component Value Date/Time   NA 137 09/24/2018 1225   K 4.0 09/24/2018 1225   CL 101 09/24/2018 1225   CO2 28 09/24/2018 1225   BUN 11 09/24/2018 1225   CREATININE 0.73 09/24/2018 1225   CREATININE 0.76 11/17/2015 1005      Component Value Date/Time   CALCIUM 9.4 09/24/2018 1225   ALKPHOS 84 09/24/2018 1225   AST 16 09/24/2018 1225   ALT 11 09/24/2018 1225   BILITOT 0.4 09/24/2018 1225       All questions were answered. The patient knows to call the clinic with any problems, questions or concerns. No barriers to learning was detected.  I spent 15 minutes counseling the patient face to face. The total time spent in the appointment was 20 minutes and more than 50% was on counseling and review of test results  Heath Lark, MD 09/26/2018  10:42 AM

## 2018-09-28 ENCOUNTER — Telehealth: Payer: Self-pay | Admitting: Oncology

## 2018-09-28 NOTE — Telephone Encounter (Signed)
PLs see my long documentation from 8/31 Yes, her symptom could be due to chemo No, I have no solution to the problem We have wait for CT as scheduled to see if she respond to chemo or not I recommend she takes her pain medicine as needed for pain

## 2018-09-28 NOTE — Telephone Encounter (Signed)
[name] called and said she has red, sore and painful area around the outside of her rectum.  She said it will clear up and come back and started a few months ago. She has tried using Vaseline without relief and fluticasone cream 0.5% that she got from her dermatologist for another issue which clears it up but comes back.  She also reports having some urinary incontinence which may be irritating the area as well. She denies having diarrhea or constipation. She said she needs something to clear it up and is wondering if it is from chemo.

## 2018-09-28 NOTE — Telephone Encounter (Signed)
Called [place] back with message from Dr. Alvy Bimler. She verbalized understanding and agreement.

## 2018-10-01 ENCOUNTER — Ambulatory Visit: Payer: Self-pay | Admitting: Pharmacist

## 2018-10-02 ENCOUNTER — Telehealth: Payer: Self-pay | Admitting: *Deleted

## 2018-10-02 NOTE — Telephone Encounter (Signed)
Spoke with patient and she stated that concern was addressed. Nothing further needed at this time. Copied from [place] [phone]. Topic: General - Other >> Sep 20, 2018  4:32 PM Pauline Good wrote: Reason for CRM : Want to know status of fax that was sent need pt's last office visit.

## 2018-10-03 ENCOUNTER — Other Ambulatory Visit: Payer: Self-pay

## 2018-10-03 ENCOUNTER — Telehealth (INDEPENDENT_AMBULATORY_CARE_PROVIDER_SITE_OTHER): Payer: PPO | Admitting: Family Medicine

## 2018-10-03 DIAGNOSIS — B351 Tinea unguium: Secondary | ICD-10-CM

## 2018-10-03 DIAGNOSIS — L602 Onychogryphosis: Secondary | ICD-10-CM | POA: Diagnosis not present

## 2018-10-03 NOTE — Progress Notes (Signed)
Virtual Visit via Video Note   I connected with Haley [facility]padd [facility] on 10/03/18 by a video enabled telemedicine application and verified that I am speaking with the correct person using two identifiers.  Location patient: home Location provider:work office Persons participating in the virtual visit: patient, provider  I discussed the limitations of evaluation and management by telemedicine and the availability of in person appointments. The patient expressed understanding and agreed to proceed.   HPI: Haley [name] is a 73 yo with Hx of anxiety and ovarian cancer undergoing chemo who is concerned about "peeling toes." Upon further questioning it seems more toenail hypertrophy and partial avulsion of some toenails.  2nd and 4th left toes. They look "deformed."  Problem has happened for a while, it is constant and seems to be stable. No exacerbating or alleviating factors identified.  She denies hx of trauma.  She has not tried OTC medications. She denies periungual erythema, edema, tenderness, or pruritus.  Negative for associated fever,chills,decreased appetite, or IP toes arthralgias. No LE edema,erythema,or leg pain.  She is undergoing chemotherapy for ovarian cancer, Bevacizumab injection every 28 days.   ROS: See pertinent positives and negatives per HPI.  Past Medical History:  Diagnosis Date  . Allergy    SEASONAL  . Anxiety   . Cataract    BILATERAL  . Dysrhythmia   . Family history of lung cancer   . Family history of prostate cancer   . Family history of prostate cancer   . Family history of uterine cancer   . Fibromyalgia   . GERD (gastroesophageal reflux disease)   . H/O echocardiogram 04/28/08   EF>55% trace mitral regurgitation, No significant valvular pathology  . Hemorrhoids    internal  . History of stress test 02/08/2011   Normal Myocardial perfusion study, this is a low risk scan, No prior study available for comparison  . Hyperlipidemia   . Hypertension    hx of  . Hypothyroidism   . MVP (mitral valve prolapse)    mild and MR  . OA (osteoarthritis)   . Osteoporosis   . ovarian ca dx'd 12/2017   ovarian cancer  . Paroxysmal A-fib ([place])   . Sleep apnea    does not use prescribed CPAP  does not tolerate  . Thyroid disease    HYPO  . Varicosities of leg   . Vitamin D deficiency     Past Surgical History:  Procedure Laterality Date  . ABDOMINAL HYSTERECTOMY  1989  . APPENDECTOMY  1989   with hysterectomy  . BILATERAL SALPINGECTOMY N/A 03/27/2018   Procedure: EXPLORATORY LAPARATOMY, BILATERAL SALPINGOOPHERECTOMY;  Surgeon: Everitt Amber, MD;  Location: [facility];  Service: Gynecology;  Laterality: N/A;  . BREAST EXCISIONAL BIOPSY Left 1995   Benign  . COLONOSCOPY  11-20-2000  . DEBULKING N/A 03/27/2018   Procedure: RADICAL TUMOR DEBULKING;  Surgeon: Everitt Amber, MD;  Location: [facility];  Service: Gynecology;  Laterality: N/A;  . IR IMAGING GUIDED PORT INSERTION  01/17/2018  . NOSE SURGERY  1990  . OMENTECTOMY N/A 03/27/2018   Procedure: OMENTECTOMY;  Surgeon: Everitt Amber, MD;  Location: [facility];  Service: Gynecology;  Laterality: N/A;    Family History  Problem Relation Age of Onset  . Diabetes Daughter   . Diabetes Mother   . Hypertension Mother   . Heart disease Mother   . Stroke Maternal Grandmother   . Lung cancer Niece   . Cancer Paternal Aunt        type of  cancer unk  . Prostate cancer Paternal Uncle   . Uterine cancer Cousin        dx under 77  . Colon cancer Neg Hx     Social History   Socioeconomic History  . Marital status: Married    Spouse name: Not on file  . Number of children: Not on file  . Years of education: Not on file  . Highest education level: Not on file  Occupational History  . Not on file  Social Needs  . Financial resource strain: Not on file  . Food insecurity    Worry: Not on file    Inability: Not on file  . Transportation needs    Medical: Not on file    Non-medical: Not on file  Tobacco  Use  . Smoking status: Light Tobacco Smoker    Years: 10.00    Types: Cigars  . Smokeless tobacco: Never Used  . Tobacco comment: not daily  [place] cigars 1-2   Substance and Sexual Activity  . Alcohol use: No    Alcohol/week: 0.0 standard drinks  . Drug use: No  . Sexual activity: Yes    Comment: 1st intercourse 17yo-1 partner  Lifestyle  . Physical activity    Days per week: Not on file    Minutes per session: Not on file  . Stress: Not on file  Relationships  . Social Herbalist on phone: Not on file    Gets together: Not on file    Attends religious service: Not on file    Active member of club or organization: Not on file    Attends meetings of clubs or organizations: Not on file    Relationship status: Not on file  . Intimate partner violence    Fear of current or ex partner: Not on file    Emotionally abused: Not on file    Physically abused: Not on file    Forced sexual activity: Not on file  Other Topics Concern  . Not on file  Social History Narrative  . Not on file     Current Outpatient Medications:  .  acetaminophen (TYLENOL) 500 MG tablet, Take 2 tablets (1,000 mg total) by mouth every 6 (six) hours. (Patient taking differently: Take 1,000 mg by mouth every 6 (six) hours as needed. ), Disp: 30 tablet, Rfl: 0 .  ALPRAZolam (XANAX) 0.5 MG tablet, Take 0.5-1 tablets (0.25-0.5 mg total) by mouth daily as needed for anxiety., Disp: 30 tablet, Rfl: 2 .  alum & mag hydroxide-simeth (MAALOX/MYLANTA) 200-200-20 MG/5ML suspension, Take 30 mLs by mouth every 6 (six) hours as needed for indigestion or heartburn., Disp: 355 mL, Rfl: 0 .  amLODipine (NORVASC) 10 MG tablet, Take 1 tablet (10 mg total) by mouth daily., Disp: 90 tablet, Rfl: 3 .  Calcium Carbonate (CALTRATE 600) 1500 MG TABS, Take 600 mg of elemental calcium by mouth daily. , Disp: , Rfl:  .  cholecalciferol (VITAMIN D) 1000 UNITS tablet, Take 1,000 Units by mouth daily. , Disp: , Rfl:  .   fluticasone (FLONASE) 50 MCG/ACT nasal spray, Use 2 sprays in each nostril daily (Patient taking differently: Place 2 sprays into both nostrils daily. ), Disp: 48 g, Rfl: 2 .  hydrochlorothiazide (HYDRODIURIL) 25 MG tablet, Take 1 tablet (25 mg total) by mouth daily., Disp: 90 tablet, Rfl: 1 .  ibuprofen (ADVIL,MOTRIN) 200 MG tablet, Take 600 mg by mouth every 6 (six) hours as needed for moderate pain. , Disp: ,  Rfl:  .  lactose free nutrition (BOOST PLUS) LIQD, Take 237 mLs by mouth daily. (Patient taking differently: Take 237 mLs by mouth 2 (two) times daily between meals. ), Disp: 10 Can, Rfl: 0 .  levothyroxine (SYNTHROID) 75 MCG tablet, Take 1 tablet (75 mcg total) by mouth daily., Disp: 90 tablet, Rfl: 3 .  lidocaine-prilocaine (EMLA) cream, Apply to affected area once, Disp: 30 g, Rfl: 3 .  Magnesium 400 MG TABS, Take 250 mg by mouth daily., Disp: , Rfl:  .  Menthol-Methyl Salicylate (MUSCLE RUB) 10-15 % CREA, Apply 1 application topically as needed for muscle pain., Disp: , Rfl:  .  metoprolol succinate (TOPROL-XL) 50 MG 24 hr tablet, Take 1 tablet (50 mg total) by mouth daily. Take with or immediately following a meal., Disp: 90 tablet, Rfl: 3 .  Multiple Vitamins-Minerals (MULTIVITAMIN WITH MINERALS) tablet, Take 1 tablet by mouth daily.  , Disp: , Rfl:  .  Omega-3 Fatty Acids (FISH OIL) 1000 MG CAPS, Take 2,000 mg by mouth 2 (two) times daily. , Disp: , Rfl:  .  omeprazole (PRILOSEC) 40 MG capsule, Take 1 capsule (40 mg total) by mouth daily., Disp: 90 capsule, Rfl: 3 .  ondansetron (ZOFRAN) 8 MG tablet, Take 1 tablet (8 mg total) by mouth every 8 (eight) hours as needed (Nausea or vomiting)., Disp: 30 tablet, Rfl: 1 .  oxyCODONE (OXY IR/ROXICODONE) 5 MG immediate release tablet, Take 1 tablet (5 mg total) by mouth every 6 (six) hours as needed for severe pain., Disp: 30 tablet, Rfl: 0 .  polyethylene glycol (MIRALAX / GLYCOLAX) packet, Take 17 g by mouth daily., Disp: 14 each, Rfl: 0 .   prochlorperazine (COMPAZINE) 10 MG tablet, Take 1 tablet (10 mg total) by mouth every 6 (six) hours as needed (Nausea or vomiting)., Disp: 30 tablet, Rfl: 1 .  rivaroxaban (XARELTO) 20 MG TABS tablet, Take 1 tablet (20 mg total) by mouth daily with supper., Disp: 14 tablet, Rfl: 0 .  senna (SENOKOT) 8.6 MG TABS tablet, Take 1 tablet (8.6 mg total) by mouth at bedtime as needed for mild constipation., Disp: 120 each, Rfl: 0 .  senna-docusate (SENOKOT-S) 8.6-50 MG tablet, Take 2 tablets by mouth at bedtime. For AFTER surgery (Patient not taking: Reported on 09/04/2018), Disp: 30 tablet, Rfl: 0 .  simvastatin (ZOCOR) 20 MG tablet, Take 1 tablet (20 mg total) by mouth at bedtime., Disp: 90 tablet, Rfl: 3 .  vitamin C (ASCORBIC ACID) 500 MG tablet, Take 1,000 mg by mouth daily., Disp: , Rfl:   EXAM:  VITALS per patient if applicable:N/A  GENERAL: alert, oriented, appears well and in no acute distress  HEENT: atraumatic, conjunctiva clear, no obvious abnormalities on inspection.  LUNGS: on inspection no signs of respiratory distress, breathing rate appears normal, no obvious gross SOB, gasping or wheezing  CV: no obvious cyanosis  Haley: moves all visible extremities without noticeable abnormality  Left foot: 2nd and 4th toe nails avulsion of distal aspect,2nd toenail with hyperpigmentation changes on 1/2 of nail sir face. Thick nail bed and what seems to be subungual debris .  PSYCH/NEURO: pleasant and cooperative, no obvious depression, + anxious.  ASSESSMENT AND PLAN:  Discussed the following assessment and plan:  Hypertrophic toenail We discussed possible etiologies. Explained that toe nails changes happen sometimes with age, pigmentation and thickness changes. Based on inspection, this does not seem to be a serious process.  Continue monitoring for changes.  Onychomycosis Educated about Dx and treatment options. Recommend  soaking foot in vinegar and water, ratio 1-1, a few times  during the way. Trimmed down toenail that are affected. I do not recommend oral antimycotic medication due to possible side effects. She can applied Vick VapoRub on affected toenails at night.  She could arrange appointment with podiatrist if still concerned or problem gets worse.   25 min face to face OV. > 50% was dedicated to discussion of Dx, prognosis, treatment options, and side effects of medications. Anxious,when through education about prognosis a few times, needs a lot of reassuring.  I discussed the assessment and treatment plan with the patient. She was provided an opportunity to ask questions and all were answered. She agreed with the plan and demonstrated an understanding of the instructions.   The patient was advised to call back or seek an in-person evaluation if the symptoms worsen or if the condition fails to improve as anticipated.  Return if symptoms worsen or fail to improve.    Harla Martinique, MD

## 2018-10-05 ENCOUNTER — Other Ambulatory Visit: Payer: Self-pay

## 2018-10-05 ENCOUNTER — Ambulatory Visit (HOSPITAL_COMMUNITY)
Admission: RE | Admit: 2018-10-05 | Discharge: 2018-10-05 | Disposition: A | Discharge status: Home / Self Care | Payer: PPO | Source: Ambulatory Visit | Attending: Hematology and Oncology | Admitting: Hematology and Oncology

## 2018-10-05 DIAGNOSIS — C561 Malignant neoplasm of right ovary: Secondary | ICD-10-CM | POA: Diagnosis not present

## 2018-10-05 DIAGNOSIS — K668 Other specified disorders of peritoneum: Secondary | ICD-10-CM | POA: Diagnosis not present

## 2018-10-05 MED ORDER — SODIUM CHLORIDE (PF) 0.9 % IJ SOLN
INTRAMUSCULAR | Status: AC
  Filled 2018-10-05: qty 50

## 2018-10-05 MED ORDER — IOHEXOL 300 MG/ML  SOLN
100.0000 mL | Freq: Once | INTRAMUSCULAR | Status: AC | PRN
  Administered 2018-10-05: 100 mL via INTRAVENOUS

## 2018-10-08 ENCOUNTER — Inpatient Hospital Stay: Payer: PPO

## 2018-10-08 ENCOUNTER — Inpatient Hospital Stay (HOSPITAL_BASED_OUTPATIENT_CLINIC_OR_DEPARTMENT_OTHER): Payer: PPO | Admitting: Hematology and Oncology

## 2018-10-08 ENCOUNTER — Other Ambulatory Visit: Payer: Self-pay

## 2018-10-08 ENCOUNTER — Other Ambulatory Visit: Payer: Self-pay | Admitting: Hematology and Oncology

## 2018-10-08 ENCOUNTER — Ambulatory Visit: Payer: Self-pay | Admitting: Pharmacist

## 2018-10-08 DIAGNOSIS — Z5112 Encounter for antineoplastic immunotherapy: Secondary | ICD-10-CM | POA: Diagnosis not present

## 2018-10-08 DIAGNOSIS — C569 Malignant neoplasm of unspecified ovary: Secondary | ICD-10-CM

## 2018-10-08 DIAGNOSIS — Z5111 Encounter for antineoplastic chemotherapy: Secondary | ICD-10-CM

## 2018-10-08 DIAGNOSIS — C561 Malignant neoplasm of right ovary: Secondary | ICD-10-CM

## 2018-10-08 DIAGNOSIS — Z7189 Other specified counseling: Secondary | ICD-10-CM | POA: Diagnosis not present

## 2018-10-08 DIAGNOSIS — R079 Chest pain, unspecified: Secondary | ICD-10-CM

## 2018-10-08 DIAGNOSIS — M79606 Pain in leg, unspecified: Secondary | ICD-10-CM

## 2018-10-08 DIAGNOSIS — I10 Essential (primary) hypertension: Secondary | ICD-10-CM

## 2018-10-08 LAB — CMP (CANCER CENTER ONLY)
ALT: 11 U/L (ref 0–44)
AST: 15 U/L (ref 15–41)
Albumin: 3.5 g/dL (ref 3.5–5.0)
Alkaline Phosphatase: 78 U/L (ref 38–126)
Anion gap: 7 (ref 5–15)
BUN: 15 mg/dL (ref 8–23)
CO2: 27 mmol/L (ref 22–32)
Calcium: 9.2 mg/dL (ref 8.9–10.3)
Chloride: 103 mmol/L (ref 98–111)
Creatinine: 0.69 mg/dL (ref 0.44–1.00)
GFR, Est AFR Am: >60 mL/min (ref >60)
GFR, Estimated: >60 mL/min (ref >60)
Glucose, Bld: 71 mg/dL (ref 70–99)
Potassium: 3.8 mmol/L (ref 3.5–5.1)
Sodium: 137 mmol/L (ref 135–145)
Total Bilirubin: 0.4 mg/dL (ref 0.3–1.2)
Total Protein: 6.8 g/dL (ref 6.5–8.1)

## 2018-10-08 LAB — CBC WITH DIFFERENTIAL (CANCER CENTER ONLY)
Abs Immature Granulocytes: 0.01 10*3/uL (ref 0.00–0.07)
Basophils Absolute: 0.1 10*3/uL (ref 0.0–0.1)
Basophils Relative: 1 %
Eosinophils Absolute: 0.4 10*3/uL (ref 0.0–0.5)
Eosinophils Relative: 7 %
HCT: 36.5 % (ref 36.0–46.0)
Hemoglobin: 12 g/dL (ref 12.0–15.0)
Immature Granulocytes: 0 %
Lymphocytes Relative: 29 %
Lymphs Abs: 1.6 10*3/uL (ref 0.7–4.0)
MCH: 31.2 pg (ref 26.0–34.0)
MCHC: 32.9 g/dL (ref 30.0–36.0)
MCV: 94.8 fL (ref 80.0–100.0)
Monocytes Absolute: 0.4 10*3/uL (ref 0.1–1.0)
Monocytes Relative: 6 %
Neutro Abs: 3.3 10*3/uL (ref 1.7–7.7)
Neutrophils Relative %: 57 %
Platelet Count: 141 10*3/uL — ABNORMAL LOW (ref 150–400)
RBC: 3.85 MIL/uL — ABNORMAL LOW (ref 3.87–5.11)
RDW: 13.8 % (ref 11.5–15.5)
WBC Count: 5.7 10*3/uL (ref 4.0–10.5)
nRBC: 0 % (ref 0.0–0.2)

## 2018-10-08 LAB — TOTAL PROTEIN, URINE DIPSTICK: Protein, ur: NEGATIVE mg/dL

## 2018-10-08 MED ORDER — SODIUM CHLORIDE 0.9% FLUSH
10.0000 mL | INTRAVENOUS | Status: DC | PRN
  Administered 2018-10-08: 10 mL
  Filled 2018-10-08: qty 10

## 2018-10-08 MED ORDER — SODIUM CHLORIDE 0.9 % IV SOLN
10.0000 mg/kg | Freq: Once | INTRAVENOUS | Status: AC
  Administered 2018-10-08: 600 mg via INTRAVENOUS
  Filled 2018-10-08: qty 8

## 2018-10-08 MED ORDER — SODIUM CHLORIDE 0.9% FLUSH
10.0000 mL | Freq: Once | INTRAVENOUS | Status: AC
  Administered 2018-10-08: 10 mL
  Filled 2018-10-08: qty 10

## 2018-10-08 MED ORDER — SODIUM CHLORIDE 0.9 % IV SOLN
Freq: Once | INTRAVENOUS | Status: AC
  Administered 2018-10-08: 13:00:00 via INTRAVENOUS
  Filled 2018-10-08: qty 250

## 2018-10-08 MED ORDER — HEPARIN SOD (PORK) LOCK FLUSH 100 UNIT/ML IV SOLN
500.0000 [IU] | Freq: Once | INTRAVENOUS | Status: AC | PRN
  Administered 2018-10-08: 500 [IU]
  Filled 2018-10-08: qty 5

## 2018-10-08 NOTE — Patient Instructions (Signed)

## 2018-10-08 NOTE — Patient Instructions (Signed)
[facility] Discharge Instructions for Patients Receiving Chemotherapy  Today you received the following chemotherapy agents Bevacizumab (AVASTIN).  To help prevent nausea and vomiting after your treatment, we encourage you to take your nausea medication as prescribed.   If you develop nausea and vomiting that is not controlled by your nausea medication, call the clinic.   BELOW ARE SYMPTOMS THAT SHOULD BE REPORTED IMMEDIATELY:  *FEVER GREATER THAN 100.5 F  *CHILLS WITH OR WITHOUT FEVER  NAUSEA AND VOMITING THAT IS NOT CONTROLLED WITH YOUR NAUSEA MEDICATION  *UNUSUAL SHORTNESS OF BREATH  *UNUSUAL BRUISING OR BLEEDING  TENDERNESS IN MOUTH AND THROAT WITH OR WITHOUT PRESENCE OF ULCERS  *URINARY PROBLEMS  *BOWEL PROBLEMS  UNUSUAL RASH Items with * indicate a potential emergency and should be followed up as soon as possible.  Feel free to call the clinic should you have any questions or concerns. The clinic phone number is [phone].  Please show the CHEMO ALERT CARD at check-in to the Emergency Department and triage nurse.   Coronavirus (COVID-19) Are you at risk?  Are you at risk for the Coronavirus (COVID-19)?  To be considered HIGH RISK for Coronavirus (COVID-19), you have to meet the following criteria:  . Traveled to [place], [place], [place], [place] or [place]; or in the [place] to [place], [place], [place], or [place]; and have fever, cough, and shortness of breath within the last 2 weeks of travel OR . Been in close contact with a person diagnosed with COVID-19 within the last 2 weeks and have fever, cough, and shortness of breath . IF YOU DO NOT MEET THESE CRITERIA, YOU ARE CONSIDERED LOW RISK FOR COVID-19.  What to do if you are HIGH RISK for COVID-19?  . If you are having a medical emergency, call 911. . Seek medical care right away. Before you go to a doctor's office, urgent care or emergency department, call ahead and tell them  about your recent travel, contact with someone diagnosed with COVID-19, and your symptoms. You should receive instructions from your physician's office regarding next steps of care.  . When you arrive at healthcare provider, tell the healthcare staff immediately you have returned from visiting [place], [place], [place], [place] or [place]; or traveled in the [place] to [place], [place], [place], or [place]; in the last two weeks or you have been in close contact with a person diagnosed with COVID-19 in the last 2 weeks.   . Tell the health care staff about your symptoms: fever, cough and shortness of breath. . After you have been seen by a medical provider, you will be either: o Tested for (COVID-19) and discharged home on quarantine except to seek medical care if symptoms worsen, and asked to  - Stay home and avoid contact with others until you get your results (4-5 days)  - Avoid travel on public transportation if possible (such as bus, train, or airplane) or o Sent to the Emergency Department by EMS for evaluation, COVID-19 testing, and possible admission depending on your condition and test results.  What to do if you are LOW RISK for COVID-19?  Reduce your risk of any infection by using the same precautions used for avoiding the common cold or flu:  . Wash your hands often with soap and warm water for at least 20 seconds.  If soap and water are not readily available, use an alcohol-based hand sanitizer with at least 60% alcohol.  . If coughing   sneezing, cover your mouth and nose by coughing or sneezing into the elbow areas of your shirt or coat, into a tissue or into your sleeve (not your hands). . Avoid shaking hands with others and consider head nods or verbal greetings only. . Avoid touching your eyes, nose, or mouth with unwashed hands.  . Avoid close contact with people who are sick. . Avoid places or events with large numbers of people in one location, like concerts or  sporting events. . Carefully consider travel plans you have or are making. . If you are planning any travel outside or inside the [place], visit the CDC's Travelers' Health webpage for the latest health notices. . If you have some symptoms but not all symptoms, continue to monitor at home and seek medical attention if your symptoms worsen. . If you are having a medical emergency, call 911.   [place] / e-Visit: [email or web address]         [facility]: [facility]: [phone]                   [facility]: [phone]

## 2018-10-09 ENCOUNTER — Encounter: Payer: Self-pay | Admitting: Hematology and Oncology

## 2018-10-09 ENCOUNTER — Telehealth: Payer: Self-pay | Admitting: Hematology and Oncology

## 2018-10-09 NOTE — Assessment & Plan Note (Signed)
We have further discussions about goals of care The patient has platinum refractory disease We have discussed prognosis in the past and the goals of care is strictly palliative in nature Currently, imaging study shows stable disease status I try my best to explain to the patient to the best of my ability but some barriers to learning is definitely detected The patient does not wish for me to call her family to share the plan of care

## 2018-10-09 NOTE — Telephone Encounter (Signed)
I talk with patient regarding schedule  

## 2018-10-09 NOTE — Assessment & Plan Note (Signed)
I have reviewed imaging studies and blood work with the patient I spent at least 10 minutes explaining to the patient the correlation between tumor marker and CT imaging was not concordant in this situation Her rising tumor marker suggest she has microscopic disease that is not detectable on CT imaging Overall, CT scan did not show new disease or anything that would suggest disease progression Overall, I would classify response to treatment is stable Previously, she had nonspecific bowel issues and hand-foot syndrome along with hypertension and side effects of therapy We discussed options moving forward will include continuing current treatment versus chemotherapy holiday versus switching treatment to something else Despite my best effort of explaining each rationale of option, she is not able to grasp a reasonable understanding Ultimately, we decided to continue current treatment for 3 more months I plan to order echocardiogram next week to monitor heart function while on doxorubicin

## 2018-10-09 NOTE — Assessment & Plan Note (Signed)
Her blood pressure control is satisfactory She will continue current prescription anti-hypertensives

## 2018-10-09 NOTE — Progress Notes (Signed)
[place] OFFICE PROGRESS NOTE  Patient Care Team: Martinique, Mesa G, MD as PCP - General (Family Medicine) Troy Sine, MD as PCP - Cardiology (Cardiology) Awanda Mink Craige Cotta, RN as Oncology Nurse Navigator (Oncology)  ASSESSMENT & PLAN:  Right ovarian epithelial cancer [facility]) I have reviewed imaging studies and blood work with the patient I spent at least 10 minutes explaining to the patient the correlation between tumor marker and CT imaging was not concordant in this situation Her rising tumor marker suggest she has microscopic disease that is not detectable on CT imaging Overall, CT scan did not show new disease or anything that would suggest disease progression Overall, I would classify response to treatment is stable Previously, she had nonspecific bowel issues and hand-foot syndrome along with hypertension and side effects of therapy We discussed options moving forward will include continuing current treatment versus chemotherapy holiday versus switching treatment to something else Despite my best effort of explaining each rationale of option, she is not able to grasp a reasonable understanding Ultimately, we decided to continue current treatment for 3 more months I plan to order echocardiogram next week to monitor heart function while on doxorubicin  Essential hypertension, benign Her blood pressure control is satisfactory She will continue current prescription anti-hypertensives  Goals of care, counseling/discussion We have further discussions about goals of care The patient has platinum refractory disease We have discussed prognosis in the past and the goals of care is strictly palliative in nature Currently, imaging study shows stable disease status I try my best to explain to the patient to the best of my ability but some barriers to learning is definitely detected The patient does not wish for me to call her family to share the plan of care    No orders of  the defined types were placed in this encounter.   INTERVAL HISTORY: Please see below for problem oriented charting. She returns to review CT imaging results She denies rectal pain recently Her blood pressure control at home was excellent She denies other side effects from treatment recently  SUMMARY OF ONCOLOGIC HISTORY: Oncology History Overview Note  Neg for genetics blood work and HRD   Right ovarian epithelial cancer ([place])  01/06/2018 Imaging   Ct abdomen and pelvis 1. Complex cystic mass at the right adnexa, measuring 5.9 x 4.0 cm, with nodular components, concerning for primary ovarian malignancy. 2. Diffuse nodularity along the omentum at the left side of the abdomen, extending into the mesentery at the left mid abdomen, concerning for peritoneal carcinomatosis. 3. Wall thickening at the distal ileum adjacent to the ovarian mass; bowel loops appear somewhat adherent to the ovarian mass. Bowel infiltration with tumor cannot be excluded. No evidence of bowel obstruction at this time. 4. Small volume ascites within the abdomen and pelvis.  Aortic Atherosclerosis (ICD10-I70.0).   01/08/2018 Tumor Marker   Patient's tumor was tested for the following markers: CA-125. Results of the tumor marker test revealed 3004   01/15/2018 Imaging   Chest CT:  1. No active cardiopulmonary disease. 2. Aortic atherosclerosis without aneurysm or dissection. 3. No large central pulmonary embolus.  CT AP:  1. Dilated fluid-filled loops of small bowel are redemonstrated slightly more extensive than on prior exam with transition point likely in the right adnexa adjacent to a complex cystic mass concerning for ovarian neoplasm given septations and soft tissue nodularity. This raises concern for early or partial SBO. This soft tissue mass measures 4.9 x 4 x 4.7 cm  and has not changed since prior recent comparison. Additional short segmental area of luminal narrowing is noted in the right lower  quadrant involving small bowel for which stigmata of peritoneal carcinomatosis or small-bowel metastatic implants might account for this. 2. Redemonstration of small volume of ascites predominantly in the upper abdomen surrounding the liver and spleen. 3. Redemonstration of thick bandlike omental thickening concerning for peritoneal carcinomatosis.    01/15/2018 Procedure   Successful ultrasound-guided diagnostic and therapeutic paracentesis yielding 1.5 liters of peritoneal fluid.    01/15/2018 Pathology Results   PERITONEAL/ASCITIC FLUID (SPECIMEN 1 OF 1 COLLECTED 01/18/18): MALIGNANT CELLS CONSISTENT WITH METASTATIC ADENOCARCINOMA. SEE COMMENT. COMMENT: THE MALIGNANT CELLS ARE POSITIVE FOR MOC-31, CYTOKERATIN 7, ESTROGEN RECEPTOR, PAX-8, AND WT-1. THEY ARE NEGATIVE FOR CALRETININ, CYTOKERATIN 5/6, AND CYTOKERATIN 20. THE PROFILE IS CONSISTENT WITH A PRIMARY GYNECOLOGIC CARCINOMA. THERE IS LIKELY SUFFICIENT TUMOR PRESENT, IF ADDITIONAL STUDIES ARE REQUESTED.   01/15/2018 - 02/02/2018 Hospital Admission   She was admitted to the hospital for SBO. She was treated with chemotherapy   01/18/2018 - 06/20/2018 Chemotherapy   The patient had carboplatin and taxol   02/08/2018 Cancer Staging   Staging form: Ovary, Fallopian Tube, and Primary Peritoneal Carcinoma, AJCC 8th Edition - Clinical: cT3, cN0, cM0 - Signed by Heath Lark, MD on 02/08/2018   02/08/2018 Tumor Marker   Patient's tumor was tested for the following markers: CA-125. Results of the tumor marker test revealed 445   03/02/2018 Tumor Marker   Patient's tumor was tested for the following markers: CA-125. Results of the tumor marker test revealed 174   03/15/2018 Imaging   6.2 cm complex cystic right ovarian mass, compatible with malignant ovarian neoplasm, mildly progressive.  Associated peritoneal disease/omental caking beneath the anterior abdominal wall, mildly improved. Prior abdominal ascites is improved/resolved.  4.7 cm cystic  left ovarian mass, without overt malignant features, grossly unchanged.  Mild bilateral hydroureteronephrosis, secondary to extrinsic compression, new.  Prior small bowel obstruction has improved/resolved.    03/27/2018 Pathology Results   1. Omentum, resection for tumor - OMENTUM: - HIGH GRADE SEROUS CARCINOMA. 2. Ovary, left - LEFT OVARY: - HIGH GRADE SEROUS CARCINOMA. - LEFT FALLOPIAN TUBE: - HIGH GRADE SEROUS CARCINOMA. 3. Adnexa - ovary +/- tube, neoplastic, right - RIGHT OVARY: - HIGH GRADE SEROUS CARCINOMA, 5.5 CM. - RIGHT FALLOPIAN TUBE: - HIGH GRADE SEROUS CARCINOMA. 4. Peritoneum, biopsy, nodule - PERITONEUM: - NODULES OF HIGH GRADE SEROUS CARCINOMA. 5. Peritoneum, resection for tumor, rectosigmoid - RECTOSIGMOID PERITONEUM: - HIGH GRADE SEROUS CARCINOMA. Microscopic Comment 3. OVARY or FALLOPIAN TUBE or PRIMARY PERITONEUM: Procedure: Bilateral salpingo-oophorectomy, omentectomy and peritoneal biopsies. Specimen Integrity: N/A. Tumor Site: Right ovary. Ovarian Surface Involvement (required only if applicable): Yes. Fallopian Tube Surface Involvement (required only if applicable): Yes. Tumor Size: 5.5 x 4.8 x 4.0 cm. Histologic Type: Serous carcinoma. Histologic Grade: High grade. Implants (required for advanced stage serous/seromucinous borderline tumors only): Omentum, left ovary and fallopian tube, rectosigmoid peritoneum and peritoneum. Other Tissue/ Organ Involvement: Left ovary and fallopian tube, omentum and rectosigmoid peritoneum. Largest Extrapelvic Peritoneal Focus (required only if applicable): 61.6 cm, omentum. Peritoneal/Ascitic Fluid: N/A. Treatment Effect (required only for high-grade serous carcinomas): Minimal. Regional Lymph Nodes: No lymph nodes submitted. Pathologic Stage Classification (pTNM, AJCC 8th Edition): pT3c, pNX. Representative Tumor Block: 3A, 3B, 3D, 3E and 29F. Comment(s): There is a 5.5 cm in greatest dimension partially cystic  high grade serous carcinoma involving the right adnexal specimen and the tumor is staged as a primary right  ovarian carcinoma. The carcinoma also involves the left ovary as well as the left fallopian tube, the omentum, peritoneum and the rectosigmoid peritoneal specimens.   03/27/2018 Surgery   Preoperative Diagnosis: ovarian cancer, stage IIIC, metastatic to omentum, peritoneum, serosa of intestines   Procedure(s) Performed: 1. Exploratory laparotomy with bilateral salpingo-oophorectomy, omentectomy radical tumor debulking for ovarian cancer .  Surgeon: Thereasa Solo, MD.   Specimens: Bilateral tubes / ovaries, omentum. Peritoneal nodules, rectosigmoid nodules.    Operative Findings: omental cake, miliary studding on diaphragm and bilateral paracolic gutters. Sigmoid colon densely adherent to left and right ovaries with tumor rind, ureters mildly dilated bilaterally due to retroperitoneal extension of the tumor towards ureters causing compression.    This represented an optimal cytoreduction (R1) with gross residual disease on the diaphragm that had been ablated and a thin tumor plaque on the sigmoid colon that was ablated. No residual disease >1cm remaining   05/07/2018 Genetic Testing   Negative genetic testing on the [employer] TumorNextHRD+ CancerNext Panel. The CancerNext gene panel offered by [employer] includes sequencing and rearrangement analysis for the following 34 genes:   APC, ATM, BARD1, BMPR1A, BRCA1, BRCA2, BRIP1, CDH1, CDK4, CDKN2A, CHEK2, DICER1, EPCAM, GREM1, HOXB13, MLH1, MRE11A, MSH2, MSH6, MUTYH, NBN, NF1, PALB2, PMS2, POLD1, POLE, PTEN, RAD50, RAD51C, RAD51D, SMAD4, SMARCA4, STK11, and TP53. Somatic genes analyzed through TumorNext-HRD: ATM, BARD1, BRCA1, BRCA2, BRIP1, CHEK2, MRE11A, NBN, PALB2, RAD51C, RAD51D. The report date is 05/07/2018.   05/11/2018 Tumor Marker   Patient's tumor was tested for the following markers: CA-125. Results of the tumor marker test revealed  60.5   07/02/2018 Imaging   1. Mild right pericardiophrenic adenopathy is mildly increased, suspicious for metastatic nodes. No abdominopelvic adenopathy. 2. Small left peritoneal soft tissue nodule adjacent to the splenic flexure and smooth left pelvic sidewall peritoneal thickening, cannot exclude residual/recurrent disease. Attention on follow-up CT advised. 3. Bilateral renal collecting system dilatation has improved. 4.  Aortic Atherosclerosis (ICD10-I70.0).   07/02/2018 Tumor Marker   Patient's tumor was tested for the following markers: CA-125 Results of the tumor marker test revealed 42.7   07/11/2018 Echocardiogram   1. The left ventricle has normal systolic function with an ejection fraction of 60-65%. The cavity size was normal. Left ventricular diastolic parameters were normal.  2. Normal GLS -20.1.  3. The right ventricle has normal systolic function. The cavity was normal. There is no increase in right ventricular wall thickness.  4. The mitral valve is degenerative. Moderate thickening of the mitral valve leaflet. Moderate calcification of the mitral valve leaflet.  5. The aortic valve is tricuspid. Moderate thickening of the aortic valve. Moderate calcification of the aortic valve. Aortic valve regurgitation is trivial by color flow Doppler.  6. The aortic root is normal in size and structure.    Chemotherapy   The patient had doxorubicin and bevacizumab for chemotherapy treatment.     07/30/2018 Tumor Marker   Patient's tumor was tested for the following markers: CA-125 Results of the tumor marker test revealed 82.6   08/27/2018 Tumor Marker   Patient's tumor was tested for the following markers: CA-125 Results of the tumor marker test revealed 111.   09/24/2018 Tumor Marker   Patient's tumor was tested for the following markers: CA-125 Results of the tumor marker test revealed 142.   10/05/2018 Imaging   1. Stable right juxta diaphragmatic/pericardiophrenic lymph node,  mildly enlarged. 2. Otherwise no definite metastatic disease identified in the abdomen/pelvis. 3. 3 mm peritoneal nodule noted  left paracolic gutter on today's study, indeterminate. Attention on follow-up recommended. 4.  Aortic Atherosclerois (ICD10-170.0)     REVIEW OF SYSTEMS:   Constitutional: Denies fevers, chills or abnormal weight loss Eyes: Denies blurriness of vision Ears, nose, mouth, throat, and face: Denies mucositis or sore throat Respiratory: Denies cough, dyspnea or wheezes Cardiovascular: Denies palpitation, chest discomfort or lower extremity swelling Gastrointestinal:  Denies nausea, heartburn or change in bowel habits Skin: Denies abnormal skin rashes Lymphatics: Denies new lymphadenopathy or easy bruising Neurological:Denies numbness, tingling or new weaknesses Behavioral/Psych: Mood is stable, no new changes  All other systems were reviewed with the patient and are negative.  I have reviewed the past medical history, past surgical history, social history and family history with the patient and they are unchanged from previous note.  ALLERGIES:  is allergic to tramadol hcl.  MEDICATIONS:  Current Outpatient Medications  Medication Sig Dispense Refill  . acetaminophen (TYLENOL) 500 MG tablet Take 2 tablets (1,000 mg total) by mouth every 6 (six) hours. (Patient taking differently: Take 1,000 mg by mouth every 6 (six) hours as needed. ) 30 tablet 0  . ALPRAZolam (XANAX) 0.5 MG tablet Take 0.5-1 tablets (0.25-0.5 mg total) by mouth daily as needed for anxiety. 30 tablet 2  . alum & mag hydroxide-simeth (MAALOX/MYLANTA) 200-200-20 MG/5ML suspension Take 30 mLs by mouth every 6 (six) hours as needed for indigestion or heartburn. 355 mL 0  . amLODipine (NORVASC) 10 MG tablet Take 1 tablet (10 mg total) by mouth daily. 90 tablet 3  . Calcium Carbonate (CALTRATE 600) 1500 MG TABS Take 600 mg of elemental calcium by mouth daily.     . cholecalciferol (VITAMIN D) 1000 UNITS  tablet Take 1,000 Units by mouth daily.     . fluticasone (FLONASE) 50 MCG/ACT nasal spray Use 2 sprays in each nostril daily (Patient taking differently: Place 2 sprays into both nostrils daily. ) 48 g 2  . hydrochlorothiazide (HYDRODIURIL) 25 MG tablet Take 1 tablet (25 mg total) by mouth daily. 90 tablet 1  . ibuprofen (ADVIL,MOTRIN) 200 MG tablet Take 600 mg by mouth every 6 (six) hours as needed for moderate pain.     Marland Kitchen lactose free nutrition (BOOST PLUS) LIQD Take 237 mLs by mouth daily. (Patient taking differently: Take 237 mLs by mouth 2 (two) times daily between meals. ) 10 Can 0  . levothyroxine (SYNTHROID) 75 MCG tablet Take 1 tablet (75 mcg total) by mouth daily. 90 tablet 3  . lidocaine-prilocaine (EMLA) cream Apply to affected area once 30 g 3  . Magnesium 400 MG TABS Take 250 mg by mouth daily.    . Menthol-Methyl Salicylate (MUSCLE RUB) 10-15 % CREA Apply 1 application topically as needed for muscle pain.    . metoprolol succinate (TOPROL-XL) 50 MG 24 hr tablet Take 1 tablet (50 mg total) by mouth daily. Take with or immediately following a meal. 90 tablet 3  . Multiple Vitamins-Minerals (MULTIVITAMIN WITH MINERALS) tablet Take 1 tablet by mouth daily.      . Omega-3 Fatty Acids (FISH OIL) 1000 MG CAPS Take 2,000 mg by mouth 2 (two) times daily.     Marland Kitchen omeprazole (PRILOSEC) 40 MG capsule Take 1 capsule (40 mg total) by mouth daily. 90 capsule 3  . ondansetron (ZOFRAN) 8 MG tablet Take 1 tablet (8 mg total) by mouth every 8 (eight) hours as needed (Nausea or vomiting). 30 tablet 1  . oxyCODONE (OXY IR/ROXICODONE) 5 MG immediate release tablet Take 1 tablet (  5 mg total) by mouth every 6 (six) hours as needed for severe pain. 30 tablet 0  . polyethylene glycol (MIRALAX / GLYCOLAX) packet Take 17 g by mouth daily. 14 each 0  . prochlorperazine (COMPAZINE) 10 MG tablet Take 1 tablet (10 mg total) by mouth every 6 (six) hours as needed (Nausea or vomiting). 30 tablet 1  . rivaroxaban  (XARELTO) 20 MG TABS tablet Take 1 tablet (20 mg total) by mouth daily with supper. 14 tablet 0  . senna (SENOKOT) 8.6 MG TABS tablet Take 1 tablet (8.6 mg total) by mouth at bedtime as needed for mild constipation. 120 each 0  . senna-docusate (SENOKOT-S) 8.6-50 MG tablet Take 2 tablets by mouth at bedtime. For AFTER surgery (Patient not taking: Reported on 09/04/2018) 30 tablet 0  . simvastatin (ZOCOR) 20 MG tablet Take 1 tablet (20 mg total) by mouth at bedtime. 90 tablet 3  . vitamin C (ASCORBIC ACID) 500 MG tablet Take 1,000 mg by mouth daily.     No current facility-administered medications for this visit.     PHYSICAL EXAMINATION: ECOG PERFORMANCE STATUS: 1 - Symptomatic but completely ambulatory  Vitals:   10/08/18 1140  BP: 122/65  Pulse: (!) 55  Resp: 18  Temp: 98.7 F (37.1 C)  SpO2: 100%   Filed Weights   10/08/18 1140  Weight: 139 lb 9.6 oz (63.3 kg)    GENERAL:alert, no distress and comfortable SKIN: skin color, texture, turgor are normal, no rashes or significant lesions EYES: normal, Conjunctiva are pink and non-injected, sclera clear OROPHARYNX:no exudate, no erythema and lips, buccal mucosa, and tongue normal  NECK: supple, thyroid normal size, non-tender, without nodularity LYMPH:  no palpable lymphadenopathy in the cervical, axillary or inguinal LUNGS: clear to auscultation and percussion with normal breathing effort HEART: regular rate & rhythm and no murmurs and no lower extremity edema ABDOMEN:abdomen soft, non-tender and normal bowel sounds Musculoskeletal:no cyanosis of digits and no clubbing  NEURO: alert & oriented x 3 with fluent speech, no focal motor/sensory deficits  LABORATORY DATA:  I have reviewed the data as listed    Component Value Date/Time   NA 137 10/08/2018 1135   K 3.8 10/08/2018 1135   CL 103 10/08/2018 1135   CO2 27 10/08/2018 1135   GLUCOSE 71 10/08/2018 1135   BUN 15 10/08/2018 1135   CREATININE 0.69 10/08/2018 1135    CREATININE 0.76 11/17/2015 1005   CALCIUM 9.2 10/08/2018 1135   PROT 6.8 10/08/2018 1135   ALBUMIN 3.5 10/08/2018 1135   AST 15 10/08/2018 1135   ALT 11 10/08/2018 1135   ALKPHOS 78 10/08/2018 1135   BILITOT 0.4 10/08/2018 1135   GFRNONAA >60 10/08/2018 1135   GFRAA >60 10/08/2018 1135    No results found for: SPEP, UPEP  Lab Results  Component Value Date   WBC 5.7 10/08/2018   NEUTROABS 3.3 10/08/2018   HGB 12.0 10/08/2018   HCT 36.5 10/08/2018   MCV 94.8 10/08/2018   PLT 141 (L) 10/08/2018      Chemistry      Component Value Date/Time   NA 137 10/08/2018 1135   K 3.8 10/08/2018 1135   CL 103 10/08/2018 1135   CO2 27 10/08/2018 1135   BUN 15 10/08/2018 1135   CREATININE 0.69 10/08/2018 1135   CREATININE 0.76 11/17/2015 1005      Component Value Date/Time   CALCIUM 9.2 10/08/2018 1135   ALKPHOS 78 10/08/2018 1135   AST 15 10/08/2018 1135  ALT 11 10/08/2018 1135   BILITOT 0.4 10/08/2018 1135       RADIOGRAPHIC STUDIES: I have reviewed multiple imaging studies with the patient I have personally reviewed the radiological images as listed and agreed with the findings in the report. Ct Abdomen Pelvis W Contrast  Result Date: 10/05/2018 CLINICAL DATA:  Ovarian cancer diagnosed 01/06/2018. Status post bilateral salpingectomy with omentectomy and tumor debulking on 03/27/2018. EXAM: CT ABDOMEN AND PELVIS WITH CONTRAST TECHNIQUE: Multidetector CT imaging of the abdomen and pelvis was performed using the standard protocol following bolus administration of intravenous contrast. CONTRAST:  169m OMNIPAQUE IOHEXOL 300 MG/ML  SOLN COMPARISON:  07/02/2018 FINDINGS: Lower chest: Right juxta diaphragmatic lymph node identified anteriorly on the previous study is stable at 10 mm today (image 5/series 2). No pleural effusion. Hepatobiliary: 14 mm well-defined low-density lesion in the dome of the left liver is stable, likely a cyst. Tiny hypodensity in the lateral segment left liver on  17/2 is too small to characterize but stable in the interval. 9 mm hypoattenuating lesion lateral segment left liver on 21/2 is stable. There is no evidence for gallstones, gallbladder wall thickening, or pericholecystic fluid. No intrahepatic or extrahepatic biliary dilation. Pancreas: No focal mass lesion. No dilatation of the main duct. No intraparenchymal cyst. No peripancreatic edema. Spleen: No splenomegaly. No focal mass lesion. Adrenals/Urinary Tract: No adrenal nodule or mass. Kidneys unremarkable. No evidence for hydroureter. The urinary bladder appears normal for the degree of distention. Stomach/Bowel: Stomach is unremarkable. No gastric wall thickening. No evidence of outlet obstruction. Duodenum is normally positioned as is the ligament of Treitz. No small bowel wall thickening. No small bowel dilatation. The terminal ileum is normal. The appendix is not visualized, but there is no edema or inflammation in the region of the cecum. No gross colonic mass. No colonic wall thickening. Vascular/Lymphatic: There is abdominal aortic atherosclerosis without aneurysm. There is no gastrohepatic or hepatoduodenal ligament lymphadenopathy. No intraperitoneal or retroperitoneal lymphadenopathy. No pelvic sidewall lymphadenopathy. Reproductive: The uterus is surgically absent. There is no adnexal mass. Other: No intraperitoneal free fluid. Status post omentectomy. Tiny 3 mm peritoneal nodule noted left paracolic gutter (image 494/8. No suspicious mesenteric soft tissue nodularity. Musculoskeletal: Stable asymmetry of the inferior rectus musculature. No worrisome lytic or sclerotic osseous abnormality. IMPRESSION: 1. Stable right juxta diaphragmatic/pericardiophrenic lymph node, mildly enlarged. 2. Otherwise no definite metastatic disease identified in the abdomen/pelvis. 3. 3 mm peritoneal nodule noted left paracolic gutter on today's study, indeterminate. Attention on follow-up recommended. 4.  Aortic  Atherosclerois (ICD10-170.0) Electronically Signed   By: EMisty StanleyM.D.   On: 10/05/2018 10:08    All questions were answered. The patient knows to call the clinic with any problems, questions or concerns.   I spent 25 minutes counseling the patient face to face. The total time spent in the appointment was 30 minutes and more than 50% was on counseling and review of test results  NHeath Lark MD 10/09/2018 8:15 AM

## 2018-10-11 ENCOUNTER — Telehealth: Payer: Self-pay | Admitting: *Deleted

## 2018-10-11 NOTE — Telephone Encounter (Signed)
Patient called and advised of Echo date and time. Patient confirms.

## 2018-10-11 NOTE — Telephone Encounter (Signed)
-----   Message from Heath Lark, MD sent at 10/08/2018  4:35 PM EDT ----- Regarding: pls schedule echo next week to monitor EF while on doxorubicin

## 2018-10-15 ENCOUNTER — Ambulatory Visit: Payer: Self-pay | Admitting: Pharmacist

## 2018-10-16 ENCOUNTER — Other Ambulatory Visit: Payer: Self-pay

## 2018-10-16 ENCOUNTER — Ambulatory Visit (HOSPITAL_COMMUNITY)
Admission: RE | Admit: 2018-10-16 | Discharge: 2018-10-16 | Disposition: A | Discharge status: Home / Self Care | Payer: PPO | Source: Ambulatory Visit | Attending: Hematology and Oncology | Admitting: Hematology and Oncology

## 2018-10-16 DIAGNOSIS — R079 Chest pain, unspecified: Secondary | ICD-10-CM | POA: Insufficient documentation

## 2018-10-16 DIAGNOSIS — I10 Essential (primary) hypertension: Secondary | ICD-10-CM | POA: Insufficient documentation

## 2018-10-16 DIAGNOSIS — Z5111 Encounter for antineoplastic chemotherapy: Secondary | ICD-10-CM | POA: Diagnosis not present

## 2018-10-16 DIAGNOSIS — C561 Malignant neoplasm of right ovary: Secondary | ICD-10-CM | POA: Diagnosis not present

## 2018-10-16 DIAGNOSIS — I083 Combined rheumatic disorders of mitral, aortic and tricuspid valves: Secondary | ICD-10-CM | POA: Diagnosis not present

## 2018-10-16 DIAGNOSIS — E785 Hyperlipidemia, unspecified: Secondary | ICD-10-CM | POA: Insufficient documentation

## 2018-10-16 NOTE — Progress Notes (Signed)
Echocardiogram 2D Echocardiogram has been performed.  Oneal Deputy Catriona Dillenbeck 10/16/2018, 10:46 AM

## 2018-10-18 ENCOUNTER — Encounter: Payer: Self-pay | Admitting: Gynecology

## 2018-10-18 ENCOUNTER — Telehealth: Payer: Self-pay | Admitting: Oncology

## 2018-10-18 NOTE — Telephone Encounter (Signed)
Haley [name] called and said her husband has been refinishing the french doors in her house with [employer].  She is worried about being exposed to the chemicals from the [employer] and wants to make sure she can have her treatment on Monday.  She has been wearing a mask.

## 2018-10-19 NOTE — Telephone Encounter (Signed)
Yes she can proceed with treatment

## 2018-10-19 NOTE — Telephone Encounter (Signed)
Left a message for Shelbe that she can proceed with treatment on Monday.

## 2018-10-22 ENCOUNTER — Ambulatory Visit: Payer: Self-pay | Admitting: Pharmacist

## 2018-10-22 ENCOUNTER — Telehealth: Payer: Self-pay | Admitting: *Deleted

## 2018-10-22 ENCOUNTER — Other Ambulatory Visit: Payer: Self-pay

## 2018-10-22 ENCOUNTER — Other Ambulatory Visit: Payer: Self-pay | Admitting: Hematology and Oncology

## 2018-10-22 ENCOUNTER — Inpatient Hospital Stay: Payer: PPO

## 2018-10-22 ENCOUNTER — Inpatient Hospital Stay: Payer: PPO | Attending: Obstetrics

## 2018-10-22 ENCOUNTER — Encounter: Payer: Self-pay | Admitting: Hematology and Oncology

## 2018-10-22 ENCOUNTER — Inpatient Hospital Stay (HOSPITAL_BASED_OUTPATIENT_CLINIC_OR_DEPARTMENT_OTHER): Payer: PPO | Admitting: Hematology and Oncology

## 2018-10-22 DIAGNOSIS — C786 Secondary malignant neoplasm of retroperitoneum and peritoneum: Secondary | ICD-10-CM | POA: Insufficient documentation

## 2018-10-22 DIAGNOSIS — C561 Malignant neoplasm of right ovary: Secondary | ICD-10-CM | POA: Diagnosis not present

## 2018-10-22 DIAGNOSIS — I10 Essential (primary) hypertension: Secondary | ICD-10-CM

## 2018-10-22 DIAGNOSIS — Z5112 Encounter for antineoplastic immunotherapy: Secondary | ICD-10-CM | POA: Diagnosis not present

## 2018-10-22 DIAGNOSIS — M79606 Pain in leg, unspecified: Secondary | ICD-10-CM

## 2018-10-22 DIAGNOSIS — Z5111 Encounter for antineoplastic chemotherapy: Secondary | ICD-10-CM | POA: Diagnosis not present

## 2018-10-22 DIAGNOSIS — Z79899 Other long term (current) drug therapy: Secondary | ICD-10-CM | POA: Diagnosis not present

## 2018-10-22 DIAGNOSIS — E039 Hypothyroidism, unspecified: Secondary | ICD-10-CM

## 2018-10-22 DIAGNOSIS — C569 Malignant neoplasm of unspecified ovary: Secondary | ICD-10-CM

## 2018-10-22 DIAGNOSIS — Z7189 Other specified counseling: Secondary | ICD-10-CM

## 2018-10-22 LAB — TSH: TSH: 0.217 u[IU]/mL — ABNORMAL LOW (ref 0.308–3.960)

## 2018-10-22 LAB — CMP (CANCER CENTER ONLY)
ALT: 11 U/L (ref 0–44)
AST: 15 U/L (ref 15–41)
Albumin: 3.4 g/dL — ABNORMAL LOW (ref 3.5–5.0)
Alkaline Phosphatase: 76 U/L (ref 38–126)
Anion gap: 9 (ref 5–15)
BUN: 9 mg/dL (ref 8–23)
CO2: 26 mmol/L (ref 22–32)
Calcium: 9.4 mg/dL (ref 8.9–10.3)
Chloride: 104 mmol/L (ref 98–111)
Creatinine: 0.7 mg/dL (ref 0.44–1.00)
GFR, Est AFR Am: >60 mL/min (ref >60)
GFR, Estimated: >60 mL/min (ref >60)
Glucose, Bld: 90 mg/dL (ref 70–99)
Potassium: 3.4 mmol/L — ABNORMAL LOW (ref 3.5–5.1)
Sodium: 139 mmol/L (ref 135–145)
Total Bilirubin: 0.3 mg/dL (ref 0.3–1.2)
Total Protein: 6.7 g/dL (ref 6.5–8.1)

## 2018-10-22 LAB — CBC WITH DIFFERENTIAL (CANCER CENTER ONLY)
Abs Immature Granulocytes: 0.02 10*3/uL (ref 0.00–0.07)
Basophils Absolute: 0.1 10*3/uL (ref 0.0–0.1)
Basophils Relative: 1 %
Eosinophils Absolute: 0.3 10*3/uL (ref 0.0–0.5)
Eosinophils Relative: 6 %
HCT: 37.1 % (ref 36.0–46.0)
Hemoglobin: 12.4 g/dL (ref 12.0–15.0)
Immature Granulocytes: 0 %
Lymphocytes Relative: 30 %
Lymphs Abs: 1.5 10*3/uL (ref 0.7–4.0)
MCH: 31.5 pg (ref 26.0–34.0)
MCHC: 33.4 g/dL (ref 30.0–36.0)
MCV: 94.2 fL (ref 80.0–100.0)
Monocytes Absolute: 0.8 10*3/uL (ref 0.1–1.0)
Monocytes Relative: 16 %
Neutro Abs: 2.4 10*3/uL (ref 1.7–7.7)
Neutrophils Relative %: 47 %
Platelet Count: 156 10*3/uL (ref 150–400)
RBC: 3.94 MIL/uL (ref 3.87–5.11)
RDW: 14 % (ref 11.5–15.5)
WBC Count: 5 10*3/uL (ref 4.0–10.5)
nRBC: 0 % (ref 0.0–0.2)

## 2018-10-22 LAB — TOTAL PROTEIN, URINE DIPSTICK: Protein, ur: NEGATIVE mg/dL

## 2018-10-22 MED ORDER — DEXAMETHASONE SODIUM PHOSPHATE 10 MG/ML IJ SOLN
4.0000 mg | Freq: Once | INTRAMUSCULAR | Status: AC
  Administered 2018-10-22: 4 mg via INTRAVENOUS

## 2018-10-22 MED ORDER — SODIUM CHLORIDE 0.9 % IV SOLN
10.0000 mg/kg | Freq: Once | INTRAVENOUS | Status: AC
  Administered 2018-10-22: 11:00:00 600 mg via INTRAVENOUS
  Filled 2018-10-22: qty 8

## 2018-10-22 MED ORDER — OXYCODONE HCL 5 MG PO TABS: 5.0000 mg | ORAL_TABLET | Freq: Four times a day (QID) | ORAL | 0 refills | Status: DC | PRN

## 2018-10-22 MED ORDER — DEXTROSE 5 % IV SOLN
Freq: Once | INTRAVENOUS | Status: AC
  Administered 2018-10-22: 12:00:00 via INTRAVENOUS
  Filled 2018-10-22: qty 250

## 2018-10-22 MED ORDER — SODIUM CHLORIDE 0.9 % IV SOLN
Freq: Once | INTRAVENOUS | Status: AC
  Administered 2018-10-22: 10:00:00 via INTRAVENOUS
  Filled 2018-10-22: qty 250

## 2018-10-22 MED ORDER — DOXORUBICIN HCL LIPOSOMAL CHEMO INJECTION 2 MG/ML
40.0000 mg/m2 | Freq: Once | INTRAVENOUS | Status: AC
  Administered 2018-10-22: 12:00:00 66 mg via INTRAVENOUS
  Filled 2018-10-22: qty 25

## 2018-10-22 MED ORDER — SODIUM CHLORIDE 0.9% FLUSH
10.0000 mL | Freq: Once | INTRAVENOUS | Status: AC
  Administered 2018-10-22: 10:00:00 10 mL
  Filled 2018-10-22: qty 10

## 2018-10-22 MED ORDER — DEXAMETHASONE SODIUM PHOSPHATE 10 MG/ML IJ SOLN
INTRAMUSCULAR | Status: AC
  Filled 2018-10-22: qty 1

## 2018-10-22 MED ORDER — SODIUM CHLORIDE 0.9% FLUSH
10.0000 mL | INTRAVENOUS | Status: DC | PRN
  Administered 2018-10-22: 10 mL
  Filled 2018-10-22: qty 10

## 2018-10-22 MED ORDER — HEPARIN SOD (PORK) LOCK FLUSH 100 UNIT/ML IV SOLN
500.0000 [IU] | Freq: Once | INTRAVENOUS | Status: AC | PRN
  Administered 2018-10-22: 500 [IU]
  Filled 2018-10-22: qty 5

## 2018-10-22 NOTE — Telephone Encounter (Signed)
She is on levothyroxine 75 mcg daily. Take a 1/2 of Levothyroxine 75 mcg on Tuesdays and Thursdays, rest of the days continue levothyroxine 75 mcg 1 tablet daily. TSH to be repeated in 6 to 8 weeks. TSH and free T4, if possible to add to labs done at oncologist's office.  Thanks, BJ

## 2018-10-22 NOTE — Telephone Encounter (Signed)
Spoke with patient and gave recommendations per Dr. Martinique concerning thyroid medication. Patient verbalized understanding.

## 2018-10-22 NOTE — Telephone Encounter (Signed)
Telephone call to Dr. Doug Sou office to advise low tsh results. Left message with the office for return call. This office can check TSH again in 2 weeks or does she want to wait until her appointment at that office at the end of the month.   Telephone call to patient to advise lab results. Left message for return call.  Telephone call returned from PCP- They will contact patient to discuss medication changes. She will need labs drawn again in 6-8 weeks.

## 2018-10-22 NOTE — Assessment & Plan Note (Signed)
Her blood pressure control is satisfactory She will continue current prescription anti-hypertensives

## 2018-10-22 NOTE — Patient Instructions (Signed)

## 2018-10-22 NOTE — Assessment & Plan Note (Signed)
She is concerned about her weight gain I drew TSH per patient request Her TSH come back borderline low The dose of her medication will need to be adjusted I will forward the information to her primary care doctor for additional advice

## 2018-10-22 NOTE — Progress Notes (Signed)
[place] OFFICE PROGRESS NOTE  Patient Care Team: [place], Exie G, MD as PCP - General (Family Medicine) Troy Sine, MD as PCP - Cardiology (Cardiology) Awanda Mink Craige Cotta, RN as Oncology Nurse Navigator (Oncology)  ASSESSMENT & PLAN:  Right ovarian epithelial cancer [facility]) I have reviewed results of her imaging and tumor marker with the patient again Her tumor marker does not appear to correlate well with CT imaging She is not symptomatic We will continue treatment Echocardiogram was within normal limits  Hypothyroidism She is concerned about her weight gain I drew TSH per patient request Her TSH come back borderline low The dose of her medication will need to be adjusted I will forward the information to her primary care doctor for additional advice  Essential hypertension, benign Her blood pressure control is satisfactory She will continue current prescription anti-hypertensives   No orders of the defined types were placed in this encounter.   INTERVAL HISTORY: Please see below for problem oriented charting. She returns for further follow-up She had numerous questions related to recent CT imaging She denies recent nausea Her blood pressure control at home is good She is concerned about weight loss She says she has not been able to see her primary care doctor to address other chronic health issues  SUMMARY OF ONCOLOGIC HISTORY: Oncology History Overview Note  Neg for genetics blood work and HRD   Right ovarian epithelial cancer ([place])  01/06/2018 Imaging   Ct abdomen and pelvis 1. Complex cystic mass at the right adnexa, measuring 5.9 x 4.0 cm, with nodular components, concerning for primary ovarian malignancy. 2. Diffuse nodularity along the omentum at the left side of the abdomen, extending into the mesentery at the left mid abdomen, concerning for peritoneal carcinomatosis. 3. Wall thickening at the distal ileum adjacent to the ovarian mass; bowel  loops appear somewhat adherent to the ovarian mass. Bowel infiltration with tumor cannot be excluded. No evidence of bowel obstruction at this time. 4. Small volume ascites within the abdomen and pelvis.  Aortic Atherosclerosis (ICD10-I70.0).   01/08/2018 Tumor Marker   Patient's tumor was tested for the following markers: CA-125. Results of the tumor marker test revealed 3004   01/15/2018 Imaging   Chest CT:  1. No active cardiopulmonary disease. 2. Aortic atherosclerosis without aneurysm or dissection. 3. No large central pulmonary embolus.  CT AP:  1. Dilated fluid-filled loops of small bowel are redemonstrated slightly more extensive than on prior exam with transition point likely in the right adnexa adjacent to a complex cystic mass concerning for ovarian neoplasm given septations and soft tissue nodularity. This raises concern for early or partial SBO. This soft tissue mass measures 4.9 x 4 x 4.7 cm and has not changed since prior recent comparison. Additional short segmental area of luminal narrowing is noted in the right lower quadrant involving small bowel for which stigmata of peritoneal carcinomatosis or small-bowel metastatic implants might account for this. 2. Redemonstration of small volume of ascites predominantly in the upper abdomen surrounding the liver and spleen. 3. Redemonstration of thick bandlike omental thickening concerning for peritoneal carcinomatosis.    01/15/2018 Procedure   Successful ultrasound-guided diagnostic and therapeutic paracentesis yielding 1.5 liters of peritoneal fluid.    01/15/2018 Pathology Results   PERITONEAL/ASCITIC FLUID (SPECIMEN 1 OF 1 COLLECTED 01/18/18): MALIGNANT CELLS CONSISTENT WITH METASTATIC ADENOCARCINOMA. SEE COMMENT. COMMENT: THE MALIGNANT CELLS ARE POSITIVE FOR MOC-31, CYTOKERATIN 7, ESTROGEN RECEPTOR, PAX-8, AND WT-1. THEY ARE NEGATIVE FOR CALRETININ, CYTOKERATIN 5/6, AND  CYTOKERATIN 20. THE PROFILE IS CONSISTENT WITH A  PRIMARY GYNECOLOGIC CARCINOMA. THERE IS LIKELY SUFFICIENT TUMOR PRESENT, IF ADDITIONAL STUDIES ARE REQUESTED.   01/15/2018 - 02/02/2018 Hospital Admission   She was admitted to the hospital for SBO. She was treated with chemotherapy   01/18/2018 - 06/20/2018 Chemotherapy   The patient had carboplatin and taxol   02/08/2018 Cancer Staging   Staging form: Ovary, Fallopian Tube, and Primary Peritoneal Carcinoma, AJCC 8th Edition - Clinical: cT3, cN0, cM0 - Signed by Heath Lark, MD on 02/08/2018   02/08/2018 Tumor Marker   Patient's tumor was tested for the following markers: CA-125. Results of the tumor marker test revealed 445   03/02/2018 Tumor Marker   Patient's tumor was tested for the following markers: CA-125. Results of the tumor marker test revealed 174   03/15/2018 Imaging   6.2 cm complex cystic right ovarian mass, compatible with malignant ovarian neoplasm, mildly progressive.  Associated peritoneal disease/omental caking beneath the anterior abdominal wall, mildly improved. Prior abdominal ascites is improved/resolved.  4.7 cm cystic left ovarian mass, without overt malignant features, grossly unchanged.  Mild bilateral hydroureteronephrosis, secondary to extrinsic compression, new.  Prior small bowel obstruction has improved/resolved.    03/27/2018 Pathology Results   1. Omentum, resection for tumor - OMENTUM: - HIGH GRADE SEROUS CARCINOMA. 2. Ovary, left - LEFT OVARY: - HIGH GRADE SEROUS CARCINOMA. - LEFT FALLOPIAN TUBE: - HIGH GRADE SEROUS CARCINOMA. 3. Adnexa - ovary +/- tube, neoplastic, right - RIGHT OVARY: - HIGH GRADE SEROUS CARCINOMA, 5.5 CM. - RIGHT FALLOPIAN TUBE: - HIGH GRADE SEROUS CARCINOMA. 4. Peritoneum, biopsy, nodule - PERITONEUM: - NODULES OF HIGH GRADE SEROUS CARCINOMA. 5. Peritoneum, resection for tumor, rectosigmoid - RECTOSIGMOID PERITONEUM: - HIGH GRADE SEROUS CARCINOMA. Microscopic Comment 3. OVARY or FALLOPIAN TUBE or PRIMARY  PERITONEUM: Procedure: Bilateral salpingo-oophorectomy, omentectomy and peritoneal biopsies. Specimen Integrity: N/A. Tumor Site: Right ovary. Ovarian Surface Involvement (required only if applicable): Yes. Fallopian Tube Surface Involvement (required only if applicable): Yes. Tumor Size: 5.5 x 4.8 x 4.0 cm. Histologic Type: Serous carcinoma. Histologic Grade: High grade. Implants (required for advanced stage serous/seromucinous borderline tumors only): Omentum, left ovary and fallopian tube, rectosigmoid peritoneum and peritoneum. Other Tissue/ Organ Involvement: Left ovary and fallopian tube, omentum and rectosigmoid peritoneum. Largest Extrapelvic Peritoneal Focus (required only if applicable): 31.5 cm, omentum. Peritoneal/Ascitic Fluid: N/A. Treatment Effect (required only for high-grade serous carcinomas): Minimal. Regional Lymph Nodes: No lymph nodes submitted. Pathologic Stage Classification (pTNM, AJCC 8th Edition): pT3c, pNX. Representative Tumor Block: 3A, 3B, 3D, 3E and 10F. Comment(s): There is a 5.5 cm in greatest dimension partially cystic high grade serous carcinoma involving the right adnexal specimen and the tumor is staged as a primary right ovarian carcinoma. The carcinoma also involves the left ovary as well as the left fallopian tube, the omentum, peritoneum and the rectosigmoid peritoneal specimens.   03/27/2018 Surgery   Preoperative Diagnosis: ovarian cancer, stage IIIC, metastatic to omentum, peritoneum, serosa of intestines   Procedure(s) Performed: 1. Exploratory laparotomy with bilateral salpingo-oophorectomy, omentectomy radical tumor debulking for ovarian cancer .  Surgeon: Thereasa Solo, MD.   Specimens: Bilateral tubes / ovaries, omentum. Peritoneal nodules, rectosigmoid nodules.    Operative Findings: omental cake, miliary studding on diaphragm and bilateral paracolic gutters. Sigmoid colon densely adherent to left and right ovaries with tumor rind,  ureters mildly dilated bilaterally due to retroperitoneal extension of the tumor towards ureters causing compression.    This represented an optimal cytoreduction (R1) with gross residual  disease on the diaphragm that had been ablated and a thin tumor plaque on the sigmoid colon that was ablated. No residual disease >1cm remaining   05/07/2018 Genetic Testing   Negative genetic testing on the [employer] TumorNextHRD+ CancerNext Panel. The CancerNext gene panel offered by [employer] includes sequencing and rearrangement analysis for the following 34 genes:   APC, ATM, BARD1, BMPR1A, BRCA1, BRCA2, BRIP1, CDH1, CDK4, CDKN2A, CHEK2, DICER1, EPCAM, GREM1, HOXB13, MLH1, MRE11A, MSH2, MSH6, MUTYH, NBN, NF1, PALB2, PMS2, POLD1, POLE, PTEN, RAD50, RAD51C, RAD51D, SMAD4, SMARCA4, STK11, and TP53. Somatic genes analyzed through TumorNext-HRD: ATM, BARD1, BRCA1, BRCA2, BRIP1, CHEK2, MRE11A, NBN, PALB2, RAD51C, RAD51D. The report date is 05/07/2018.   05/11/2018 Tumor Marker   Patient's tumor was tested for the following markers: CA-125. Results of the tumor marker test revealed 60.5   07/02/2018 Imaging   1. Mild right pericardiophrenic adenopathy is mildly increased, suspicious for metastatic nodes. No abdominopelvic adenopathy. 2. Small left peritoneal soft tissue nodule adjacent to the splenic flexure and smooth left pelvic sidewall peritoneal thickening, cannot exclude residual/recurrent disease. Attention on follow-up CT advised. 3. Bilateral renal collecting system dilatation has improved. 4.  Aortic Atherosclerosis (ICD10-I70.0).   07/02/2018 Tumor Marker   Patient's tumor was tested for the following markers: CA-125 Results of the tumor marker test revealed 42.7   07/11/2018 Echocardiogram   1. The left ventricle has normal systolic function with an ejection fraction of 60-65%. The cavity size was normal. Left ventricular diastolic parameters were normal.  2. Normal GLS -20.1.  3. The right ventricle has  normal systolic function. The cavity was normal. There is no increase in right ventricular wall thickness.  4. The mitral valve is degenerative. Moderate thickening of the mitral valve leaflet. Moderate calcification of the mitral valve leaflet.  5. The aortic valve is tricuspid. Moderate thickening of the aortic valve. Moderate calcification of the aortic valve. Aortic valve regurgitation is trivial by color flow Doppler.  6. The aortic root is normal in size and structure.    Chemotherapy   The patient had doxorubicin and bevacizumab for chemotherapy treatment.     07/30/2018 Tumor Marker   Patient's tumor was tested for the following markers: CA-125 Results of the tumor marker test revealed 82.6   08/27/2018 Tumor Marker   Patient's tumor was tested for the following markers: CA-125 Results of the tumor marker test revealed 111.   09/24/2018 Tumor Marker   Patient's tumor was tested for the following markers: CA-125 Results of the tumor marker test revealed 142.   10/05/2018 Imaging   1. Stable right juxta diaphragmatic/pericardiophrenic lymph node, mildly enlarged. 2. Otherwise no definite metastatic disease identified in the abdomen/pelvis. 3. 3 mm peritoneal nodule noted left paracolic gutter on today's study, indeterminate. Attention on follow-up recommended. 4.  Aortic Atherosclerois (ICD10-170.0)   10/16/2018 Echocardiogram    1. No significant change from prior study (07/11/2018).  2. Left ventricular ejection fraction, by visual estimation, is 60 to 65%. The left ventricle has normal function. Normal left ventricular size. There is no left ventricular hypertrophy.  3. LVEF by 3D assessment 63%.  4. The average left ventricular global longitudinal strain is -22.4 %.  5. Global right ventricle has normal systolic function.The right ventricular size is normal. No increase in right ventricular wall thickness.  6. Left atrial size was normal.  7. Right atrial size was normal.  8.  Presence of pericardial fat pad.  9. Moderate thickening of the mitral valve leaflet(s). 10. The mitral  valve is normal in structure. Trace mitral valve regurgitation. 11. The tricuspid valve is normal in structure. Tricuspid valve regurgitation is mild. 12. Aortic regurgitation PHT measures 467 msec. 13. The aortic valve is tricuspid Aortic valve regurgitation is mild by color flow Doppler. Mild aortic valve sclerosis without stenosis. 14. There is mild to moderate calcification of the AoV, with focal calcification of the [place]. The aortic regurgitation is mild in severity, likely related to degenerative valve disease. 15. The pulmonic valve was grossly normal. Pulmonic valve regurgitation is not visualized by color flow Doppler. 16. Mild plaque invoving the ascending aorta. 17. Normal pulmonary artery systolic pressure. 18. The tricuspid regurgitant velocity is 2.52 m/s, and with an assumed right atrial pressure of 3 mmHg, the estimated right ventricular systolic pressure is normal at 28.4 mmHg.     REVIEW OF SYSTEMS:   Constitutional: Denies fevers, chills or abnormal weight loss Eyes: Denies blurriness of vision Ears, nose, mouth, throat, and face: Denies mucositis or sore throat Respiratory: Denies cough, dyspnea or wheezes Cardiovascular: Denies palpitation, chest discomfort or lower extremity swelling Gastrointestinal:  Denies nausea, heartburn or change in bowel habits Skin: Denies abnormal skin rashes Lymphatics: Denies new lymphadenopathy or easy bruising Neurological:Denies numbness, tingling or new weaknesses Behavioral/Psych: Mood is stable, no new changes  All other systems were reviewed with the patient and are negative.  I have reviewed the past medical history, past surgical history, social history and family history with the patient and they are unchanged from previous note.  ALLERGIES:  is allergic to tramadol hcl.  MEDICATIONS:  Current Outpatient Medications   Medication Sig Dispense Refill  . acetaminophen (TYLENOL) 500 MG tablet Take 2 tablets (1,000 mg total) by mouth every 6 (six) hours. (Patient taking differently: Take 1,000 mg by mouth every 6 (six) hours as needed. ) 30 tablet 0  . ALPRAZolam (XANAX) 0.5 MG tablet Take 0.5-1 tablets (0.25-0.5 mg total) by mouth daily as needed for anxiety. 30 tablet 2  . alum & mag hydroxide-simeth (MAALOX/MYLANTA) 200-200-20 MG/5ML suspension Take 30 mLs by mouth every 6 (six) hours as needed for indigestion or heartburn. 355 mL 0  . amLODipine (NORVASC) 10 MG tablet Take 1 tablet (10 mg total) by mouth daily. 90 tablet 3  . Calcium Carbonate (CALTRATE 600) 1500 MG TABS Take 600 mg of elemental calcium by mouth daily.     . cholecalciferol (VITAMIN D) 1000 UNITS tablet Take 1,000 Units by mouth daily.     . fluticasone (FLONASE) 50 MCG/ACT nasal spray Use 2 sprays in each nostril daily (Patient taking differently: Place 2 sprays into both nostrils daily. ) 48 g 2  . hydrochlorothiazide (HYDRODIURIL) 25 MG tablet Take 1 tablet (25 mg total) by mouth daily. 90 tablet 1  . ibuprofen (ADVIL,MOTRIN) 200 MG tablet Take 600 mg by mouth every 6 (six) hours as needed for moderate pain.     Marland Kitchen lactose free nutrition (BOOST PLUS) LIQD Take 237 mLs by mouth daily. (Patient taking differently: Take 237 mLs by mouth 2 (two) times daily between meals. ) 10 Can 0  . levothyroxine (SYNTHROID) 75 MCG tablet Take 1 tablet (75 mcg total) by mouth daily. 90 tablet 3  . lidocaine-prilocaine (EMLA) cream Apply to affected area once 30 g 3  . Magnesium 400 MG TABS Take 250 mg by mouth daily.    . Menthol-Methyl Salicylate (MUSCLE RUB) 10-15 % CREA Apply 1 application topically as needed for muscle pain.    . metoprolol succinate (TOPROL-XL) 50  MG 24 hr tablet Take 1 tablet (50 mg total) by mouth daily. Take with or immediately following a meal. 90 tablet 3  . Multiple Vitamins-Minerals (MULTIVITAMIN WITH MINERALS) tablet Take 1 tablet  by mouth daily.      . Omega-3 Fatty Acids (FISH OIL) 1000 MG CAPS Take 2,000 mg by mouth 2 (two) times daily.     Marland Kitchen omeprazole (PRILOSEC) 40 MG capsule Take 1 capsule (40 mg total) by mouth daily. 90 capsule 3  . ondansetron (ZOFRAN) 8 MG tablet Take 1 tablet (8 mg total) by mouth every 8 (eight) hours as needed (Nausea or vomiting). 30 tablet 1  . oxyCODONE (OXY IR/ROXICODONE) 5 MG immediate release tablet Take 1 tablet (5 mg total) by mouth every 6 (six) hours as needed for severe pain. 30 tablet 0  . polyethylene glycol (MIRALAX / GLYCOLAX) packet Take 17 g by mouth daily. 14 each 0  . prochlorperazine (COMPAZINE) 10 MG tablet Take 1 tablet (10 mg total) by mouth every 6 (six) hours as needed (Nausea or vomiting). 30 tablet 1  . rivaroxaban (XARELTO) 20 MG TABS tablet Take 1 tablet (20 mg total) by mouth daily with supper. 14 tablet 0  . senna (SENOKOT) 8.6 MG TABS tablet Take 1 tablet (8.6 mg total) by mouth at bedtime as needed for mild constipation. 120 each 0  . senna-docusate (SENOKOT-S) 8.6-50 MG tablet Take 2 tablets by mouth at bedtime. For AFTER surgery (Patient not taking: Reported on 09/04/2018) 30 tablet 0  . simvastatin (ZOCOR) 20 MG tablet Take 1 tablet (20 mg total) by mouth at bedtime. 90 tablet 3  . vitamin C (ASCORBIC ACID) 500 MG tablet Take 1,000 mg by mouth daily.     No current facility-administered medications for this visit.    Facility-Administered Medications Ordered in Other Visits  Medication Dose Route Frequency Provider Last Rate Last Dose  . DOXOrubicin HCL LIPOSOMAL (DOXIL) 66 mg in dextrose 5 % 250 mL chemo infusion  40 mg/m2 (Treatment Plan Recorded) Intravenous Once Heath Lark, MD 283 mL/hr at 10/22/18 1139 66 mg at 10/22/18 1139  . heparin lock flush 100 unit/mL  500 Units Intracatheter Once PRN Alvy Bimler, Sarah Baez, MD      . sodium chloride flush (NS) 0.9 % injection 10 mL  10 mL Intracatheter PRN Alvy Bimler, Macie Baum, MD        PHYSICAL EXAMINATION: ECOG PERFORMANCE  STATUS: 1 - Symptomatic but completely ambulatory  Vitals:   10/22/18 0953  BP: 135/63  Pulse: (!) 59  Resp: 18  Temp: 98.7 F (37.1 C)  SpO2: 100%   Filed Weights   10/22/18 0953  Weight: 142 lb 3.2 oz (64.5 kg)    GENERAL:alert, no distress and comfortable SKIN: skin color, texture, turgor are normal, no rashes or significant lesions EYES: normal, Conjunctiva are pink and non-injected, sclera clear OROPHARYNX:no exudate, no erythema and lips, buccal mucosa, and tongue normal  NECK: supple, thyroid normal size, non-tender, without nodularity LYMPH:  no palpable lymphadenopathy in the cervical, axillary or inguinal LUNGS: clear to auscultation and percussion with normal breathing effort HEART: regular rate & rhythm and no murmurs and no lower extremity edema ABDOMEN:abdomen soft, non-tender and normal bowel sounds Musculoskeletal:no cyanosis of digits and no clubbing  NEURO: alert & oriented x 3 with fluent speech, no focal motor/sensory deficits  LABORATORY DATA:  I have reviewed the data as listed    Component Value Date/Time   NA 139 10/22/2018 0942   K 3.4 (L) 10/22/2018 9211  CL 104 10/22/2018 0942   CO2 26 10/22/2018 0942   GLUCOSE 90 10/22/2018 0942   BUN 9 10/22/2018 0942   CREATININE 0.70 10/22/2018 0942   CREATININE 0.76 11/17/2015 1005   CALCIUM 9.4 10/22/2018 0942   PROT 6.7 10/22/2018 0942   ALBUMIN 3.4 (L) 10/22/2018 0942   AST 15 10/22/2018 0942   ALT 11 10/22/2018 0942   ALKPHOS 76 10/22/2018 0942   BILITOT 0.3 10/22/2018 0942   GFRNONAA >60 10/22/2018 0942   GFRAA >60 10/22/2018 0942    No results found for: SPEP, UPEP  Lab Results  Component Value Date   WBC 5.0 10/22/2018   NEUTROABS 2.4 10/22/2018   HGB 12.4 10/22/2018   HCT 37.1 10/22/2018   MCV 94.2 10/22/2018   PLT 156 10/22/2018      Chemistry      Component Value Date/Time   NA 139 10/22/2018 0942   K 3.4 (L) 10/22/2018 0942   CL 104 10/22/2018 0942   CO2 26 10/22/2018  0942   BUN 9 10/22/2018 0942   CREATININE 0.70 10/22/2018 0942   CREATININE 0.76 11/17/2015 1005      Component Value Date/Time   CALCIUM 9.4 10/22/2018 0942   ALKPHOS 76 10/22/2018 0942   AST 15 10/22/2018 0942   ALT 11 10/22/2018 0942   BILITOT 0.3 10/22/2018 0942       RADIOGRAPHIC STUDIES: I have personally reviewed the radiological images as listed and agreed with the findings in the report. Ct Abdomen Pelvis W Contrast  Result Date: 10/05/2018 CLINICAL DATA:  Ovarian cancer diagnosed 01/06/2018. Status post bilateral salpingectomy with omentectomy and tumor debulking on 03/27/2018. EXAM: CT ABDOMEN AND PELVIS WITH CONTRAST TECHNIQUE: Multidetector CT imaging of the abdomen and pelvis was performed using the standard protocol following bolus administration of intravenous contrast. CONTRAST:  163m OMNIPAQUE IOHEXOL 300 MG/ML  SOLN COMPARISON:  07/02/2018 FINDINGS: Lower chest: Right juxta diaphragmatic lymph node identified anteriorly on the previous study is stable at 10 mm today (image 5/series 2). No pleural effusion. Hepatobiliary: 14 mm well-defined low-density lesion in the dome of the left liver is stable, likely a cyst. Tiny hypodensity in the lateral segment left liver on 17/2 is too small to characterize but stable in the interval. 9 mm hypoattenuating lesion lateral segment left liver on 21/2 is stable. There is no evidence for gallstones, gallbladder wall thickening, or pericholecystic fluid. No intrahepatic or extrahepatic biliary dilation. Pancreas: No focal mass lesion. No dilatation of the main duct. No intraparenchymal cyst. No peripancreatic edema. Spleen: No splenomegaly. No focal mass lesion. Adrenals/Urinary Tract: No adrenal nodule or mass. Kidneys unremarkable. No evidence for hydroureter. The urinary bladder appears normal for the degree of distention. Stomach/Bowel: Stomach is unremarkable. No gastric wall thickening. No evidence of outlet obstruction. Duodenum is  normally positioned as is the ligament of [place]. No small bowel wall thickening. No small bowel dilatation. The terminal ileum is normal. The appendix is not visualized, but there is no edema or inflammation in the region of the cecum. No gross colonic mass. No colonic wall thickening. Vascular/Lymphatic: There is abdominal aortic atherosclerosis without aneurysm. There is no gastrohepatic or hepatoduodenal ligament lymphadenopathy. No intraperitoneal or retroperitoneal lymphadenopathy. No pelvic sidewall lymphadenopathy. Reproductive: The uterus is surgically absent. There is no adnexal mass. Other: No intraperitoneal free fluid. Status post omentectomy. Tiny 3 mm peritoneal nodule noted left paracolic gutter (image 416/5. No suspicious mesenteric soft tissue nodularity. Musculoskeletal: Stable asymmetry of the inferior rectus musculature. No worrisome lytic  or sclerotic osseous abnormality. IMPRESSION: 1. Stable right juxta diaphragmatic/pericardiophrenic lymph node, mildly enlarged. 2. Otherwise no definite metastatic disease identified in the abdomen/pelvis. 3. 3 mm peritoneal nodule noted left paracolic gutter on today's study, indeterminate. Attention on follow-up recommended. 4.  Aortic Atherosclerois (ICD10-170.0) Electronically Signed   By: Misty Stanley M.D.   On: 10/05/2018 10:08    All questions were answered. The patient knows to call the clinic with any problems, questions or concerns. No barriers to learning was detected.  I spent 15 minutes counseling the patient face to face. The total time spent in the appointment was 20 minutes and more than 50% was on counseling and review of test results  Heath Lark, MD 10/22/2018 12:27 PM

## 2018-10-22 NOTE — Telephone Encounter (Signed)
Haley [name] from [facility] called because patient was concerned about her thyroid so they checked it for her while at her chemo appointment. TSH was 0.217. Does PCP want them to recheck in 2 weeks at her next chemo appointment or should patient wait until her appointment with PCP on 11/09/2018. Please advise.

## 2018-10-22 NOTE — Patient Instructions (Signed)
[name] Discharge Instructions for Patients Receiving Chemotherapy  Today you received the following chemotherapy agents : Bevacizumab (AVASTIN) & Doxorubicin (DOXIL).  To help prevent nausea and vomiting after your treatment, we encourage you to take your nausea medication as prescribed.  Do  Drink lots of fluids  As  Tolerated.  Do Not  Eat  Greasy  Nor  Spicy  Foods.   If you develop nausea and vomiting that is not controlled by your nausea medication, call the clinic.   BELOW ARE SYMPTOMS THAT SHOULD BE REPORTED IMMEDIATELY:  *FEVER GREATER THAN 100.5 F  *CHILLS WITH OR WITHOUT FEVER  NAUSEA AND VOMITING THAT IS NOT CONTROLLED WITH YOUR NAUSEA MEDICATION  *UNUSUAL SHORTNESS OF BREATH  *UNUSUAL BRUISING OR BLEEDING  TENDERNESS IN MOUTH AND THROAT WITH OR WITHOUT PRESENCE OF ULCERS  *URINARY PROBLEMS  *BOWEL PROBLEMS  UNUSUAL RASH Items with * indicate a potential emergency and should be followed up as soon as possible.  Feel free to call the clinic should you have any questions or concerns. The clinic phone number is [phone].  Please show the [facility] at check-in to the Emergency Department and triage nurse.  Bevacizumab injection What is this medicine? BEVACIZUMAB (be va SIZ yoo mab) is a monoclonal antibody. It is used to treat many types of cancer. This medicine may be used for other purposes; ask your health care provider or pharmacist if you have questions. COMMON BRAND NAME(S): Avastin, MVASI, Zirabev What should I tell my health care provider before I take this medicine? They need to know if you have any of these conditions:  diabetes  heart disease  high blood pressure  history of coughing up blood  prior anthracycline chemotherapy (e.g., doxorubicin, daunorubicin, epirubicin)  recent or ongoing radiation therapy  recent or planning to have surgery  stroke  an unusual or allergic reaction to bevacizumab, hamster  proteins, mouse proteins, other medicines, foods, dyes, or preservatives  pregnant or trying to get pregnant  breast-feeding How should I use this medicine? This medicine is for infusion into a vein. It is given by a health care professional in a hospital or clinic setting. Talk to your pediatrician regarding the use of this medicine in children. Special care may be needed. Overdosage: If you think you have taken too much of this medicine contact a poison control center or emergency room at once. NOTE: This medicine is only for you. Do not share this medicine with others. What if I miss a dose? It is important not to miss your dose. Call your doctor or health care professional if you are unable to keep an appointment. What may interact with this medicine? Interactions are not expected. This list may not describe all possible interactions. Give your health care provider a list of all the medicines, herbs, non-prescription drugs, or dietary supplements you use. Also tell them if you smoke, drink alcohol, or use illegal drugs. Some items may interact with your medicine. What should I watch for while using this medicine? Your condition will be monitored carefully while you are receiving this medicine. You will need important blood work and urine testing done while you are taking this medicine. This medicine may increase your risk to bruise or bleed. Call your doctor or health care professional if you notice any unusual bleeding. This medicine should be started at least 28 days following major surgery and the site of the surgery should be totally healed. Check with your doctor before scheduling  dental work or surgery while you are receiving this treatment. Talk to your doctor if you have recently had surgery or if you have a wound that has not healed. Do not become pregnant while taking this medicine or for 6 months after stopping it. Women should inform their doctor if they wish to become pregnant or  think they might be pregnant. There is a potential for serious side effects to an unborn child. Talk to your health care professional or pharmacist for more information. Do not breast-feed an infant while taking this medicine and for 6 months after the last dose. This medicine has caused ovarian failure in some women. This medicine may interfere with the ability to have a child. You should talk to your doctor or health care professional if you are concerned about your fertility. What side effects may I notice from receiving this medicine? Side effects that you should report to your doctor or health care professional as soon as possible:  allergic reactions like skin rash, itching or hives, swelling of the face, lips, or tongue  chest pain or chest tightness  chills  coughing up blood  high fever  seizures  severe constipation  signs and symptoms of bleeding such as bloody or black, tarry stools; red or dark-brown urine; spitting up blood or brown material that looks like coffee grounds; red spots on the skin; unusual bruising or bleeding from the eye, gums, or nose  signs and symptoms of a blood clot such as breathing problems; chest pain; severe, sudden headache; pain, swelling, warmth in the leg  signs and symptoms of a stroke like changes in vision; confusion; trouble speaking or understanding; severe headaches; sudden numbness or weakness of the face, arm or leg; trouble walking; dizziness; loss of balance or coordination  stomach pain  sweating  swelling of legs or ankles  vomiting  weight gain Side effects that usually do not require medical attention (report to your doctor or health care professional if they continue or are bothersome):  back pain  changes in taste  decreased appetite  dry skin  nausea  tiredness This list may not describe all possible side effects. Call your doctor for medical advice about side effects. You may report side effects to FDA at  1-800-FDA-1088. Where should I keep my medicine? This drug is given in a hospital or clinic and will not be stored at home. NOTE: This sheet is a summary. It may not cover all possible information. If you have questions about this medicine, talk to your doctor, pharmacist, or health care provider.  2020 Elsevier/Gold Standard (2015-12-25 14:33:29)  Doxorubicin Liposomal injection What is this medicine? LIPOSOMAL DOXORUBICIN (LIP oh som al dox oh ROO bi sin) is a chemotherapy drug. This medicine is used to treat many kinds of cancer like Kaposi's sarcoma, multiple myeloma, and ovarian cancer. This medicine may be used for other purposes; ask your health care provider or pharmacist if you have questions. COMMON BRAND NAME(S): Doxil, Lipodox What should I tell my health care provider before I take this medicine? They need to know if you have any of these conditions:  blood disorders  heart disease  infection (especially a virus infection such as chickenpox, cold sores, or herpes)  liver disease  recent or ongoing radiation therapy  an unusual or allergic reaction to doxorubicin, other chemotherapy agents, soybeans, other medicines, foods, dyes, or preservatives  pregnant or trying to get pregnant  breast-feeding How should I use this medicine? This drug is given  as an infusion into a vein. It is administered in a hospital or clinic by a specially trained health care professional. If you have pain, swelling, burning or any unusual feeling around the site of your injection, tell your health care professional right away. Talk to your pediatrician regarding the use of this medicine in children. Special care may be needed. Overdosage: If you think you have taken too much of this medicine contact a poison control center or emergency room at once. NOTE: This medicine is only for you. Do not share this medicine with others. What if I miss a dose? It is important not to miss your dose. Call  your doctor or health care professional if you are unable to keep an appointment. What may interact with this medicine? Do not take this medicine with any of the following medications:  zidovudine This medicine may also interact with the following medications:  medicines to increase blood counts like filgrastim, pegfilgrastim, sargramostim  vaccines Talk to your doctor or health care professional before taking any of these medicines:  acetaminophen  aspirin  ibuprofen  ketoprofen  naproxen This list may not describe all possible interactions. Give your health care provider a list of all the medicines, herbs, non-prescription drugs, or dietary supplements you use. Also tell them if you smoke, drink alcohol, or use illegal drugs. Some items may interact with your medicine. What should I watch for while using this medicine? Your condition will be monitored carefully while you are receiving this medicine. You may need blood work done while you are taking this medicine. This drug may make you feel generally unwell. This is not uncommon, as chemotherapy can affect healthy cells as well as cancer cells. Report any side effects. Continue your course of treatment even though you feel ill unless your doctor tells you to stop. Your urine may turn orange-red for a few days after your dose. This is not blood. If your urine is dark or brown, call your doctor. In some cases, you may be given additional medicines to help with side effects. Follow all directions for their use. Talk to your doctor about your risk of cancer. You may be more at risk for certain types of cancers if you take this medicine. Do not become pregnant while taking this medicine or for 6 months after stopping it. Women should inform their healthcare professional if they wish to become pregnant or think they may be pregnant. Men should not father a child while taking this medicine and for 6 months after stopping it. There is a  potential for serious side effects to an unborn child. Talk to your health care professional or pharmacist for more information. Do not breast-feed an infant while taking this medicine. This medicine has caused ovarian failure in some women. This medicine may make it more difficult to get pregnant. Talk to your healthcare professional if you are concerned about your fertility. This medicine has caused decreased sperm counts in some men. This may make it more difficult to father a child. Talk to your healthcare professional if you are concerned about your fertility. This medicine may cause a decrease in Co-Enzyme Q-10. You should make sure that you get enough Co-Enzyme Q-10 while you are taking this medicine. Discuss the foods you eat and the vitamins you take with your health care professional. What side effects may I notice from receiving this medicine? Side effects that you should report to your doctor or health care professional as soon as possible:  allergic  reactions like skin rash, itching or hives, swelling of the face, lips, or tongue  low blood counts - this medicine may decrease the number of white blood cells, red blood cells and platelets. You may be at increased risk for infections and bleeding.  signs of hand-foot syndrome - tingling or burning, redness, flaking, swelling, small blisters, or small sores on the palms of your hands or the soles of your feet  signs of infection - fever or chills, cough, sore throat, pain or difficulty passing urine  signs of decreased platelets or bleeding - bruising, pinpoint red spots on the skin, black, tarry stools, blood in the urine  signs of decreased red blood cells - unusually weak or tired, fainting spells, lightheadedness  back pain, chills, facial flushing, fever, headache, tightness in the chest or throat during the infusion  breathing problems  chest pain  fast, irregular heartbeat  mouth pain, redness, sores  pain, swelling,  redness at site where injected  pain, tingling, numbness in the hands or feet  swelling of ankles, feet, or hands  vomiting Side effects that usually do not require medical attention (report to your doctor or health care professional if they continue or are bothersome):  diarrhea  hair loss  loss of appetite  nail discoloration or damage  nausea  red or watery eyes  red colored urine  stomach upset This list may not describe all possible side effects. Call your doctor for medical advice about side effects. You may report side effects to FDA at 1-800-FDA-1088. Where should I keep my medicine? This drug is given in a hospital or clinic and will not be stored at home. NOTE: This sheet is a summary. It may not cover all possible information. If you have questions about this medicine, talk to your doctor, pharmacist, or health care provider.  2020 Elsevier/Gold Standard (2017-09-04 15:13:26)   Coronavirus (COVID-19) Are you at risk?  Are you at risk for the Coronavirus (COVID-19)?  To be considered HIGH RISK for Coronavirus (COVID-19), you have to meet the following criteria:  . Traveled to [place], [place], [place], [place] or [place]; or in the [place] to [place], [place], [place], or [place]; and have fever, cough, and shortness of breath within the last 2 weeks of travel OR . Been in close contact with a person diagnosed with COVID-19 within the last 2 weeks and have fever, cough, and shortness of breath . IF YOU DO NOT MEET THESE CRITERIA, YOU ARE CONSIDERED LOW RISK FOR COVID-19.  What to do if you are HIGH RISK for COVID-19?  Marland Kitchen If you are having a medical emergency, call 911. . Seek medical care right away. Before you go to a doctor's office, urgent care or emergency department, call ahead and tell them about your recent travel, contact with someone diagnosed with COVID-19, and your symptoms. You should receive instructions from your physician's office  regarding next steps of care.  . When you arrive at healthcare provider, tell the healthcare staff immediately you have returned from visiting [place], [place], [place], [place] or [place]; or traveled in the [place] to [employer], [place], [place], or [place]; in the last two weeks or you have been in close contact with a person diagnosed with COVID-19 in the last 2 weeks.   . Tell the health care staff about your symptoms: fever, cough and shortness of breath. . After you have been seen by a medical provider, you will be either: o  Tested for (COVID-19) and discharged home on quarantine except to seek medical care if symptoms worsen, and asked to  - Stay home and avoid contact with others until you get your results (4-5 days)  - Avoid travel on public transportation if possible (such as bus, train, or airplane) or o Sent to the Emergency Department by EMS for evaluation, COVID-19 testing, and possible admission depending on your condition and test results.  What to do if you are LOW RISK for COVID-19?  Reduce your risk of any infection by using the same precautions used for avoiding the common cold or flu:  Marland Kitchen Wash your hands often with soap and warm water for at least 20 seconds.  If soap and water are not readily available, use an alcohol-based hand sanitizer with at least 60% alcohol.  . If coughing or sneezing, cover your mouth and nose by coughing or sneezing into the elbow areas of your shirt or coat, into a tissue or into your sleeve (not your hands). . Avoid shaking hands with others and consider head nods or verbal greetings only. . Avoid touching your eyes, nose, or mouth with unwashed hands.  . Avoid close contact with people who are sick. . Avoid places or events with large numbers of people in one location, like concerts or sporting events. . Carefully consider travel plans you have or are making. . If you are planning any travel outside or inside the [place], visit the CDC's  Travelers' Health webpage for the latest health notices. . If you have some symptoms but not all symptoms, continue to monitor at home and seek medical attention if your symptoms worsen. . If you are having a medical emergency, call 911.   [place] / e-Visit: [email or web address]         [facility]: [facility]: [phone]                   [facility]: [phone]

## 2018-10-22 NOTE — Assessment & Plan Note (Signed)
I have reviewed results of her imaging and tumor marker with the patient again Her tumor marker does not appear to correlate well with CT imaging She is not symptomatic We will continue treatment Echocardiogram was within normal limits

## 2018-10-24 NOTE — Progress Notes (Signed)
The following biosimilar Zirabev (bevacizumab-bvzr) has been selected for use in this patient.  Maggie Jazlyn Tippens, PharmD, BCPS Oncology Pharmacist Pharmacy Phone: [phone] 10/24/2018     

## 2018-10-27 LAB — CA 125: Cancer Antigen (CA) 125: 196 U/mL — ABNORMAL HIGH (ref 0.0–38.1)

## 2018-10-29 ENCOUNTER — Other Ambulatory Visit: Payer: Self-pay | Admitting: Pharmacist

## 2018-10-29 ENCOUNTER — Other Ambulatory Visit: Payer: Self-pay | Admitting: Pharmacy Technician

## 2018-10-29 NOTE — Patient Outreach (Signed)
[place] [facility]) Care Management [place]  10/29/2018  TAQWA LINKENHOKER [birth year] [id number]  Reason for referral:medication assistance/management  Successful care coordinationHTA to discuss patient's TROOP in order to apply to Xarelto patient assistance. The goal is to apply for Xarelto patient assistance. Patient remains in the coverage gap, but is now having to pay over $300 for Xarelto.  She denies s/sx of bleeding.  She states she does not want to start warfarin.  Other DOACs, Xarelto, would be equally expensive.  Will proceed with PAP for Xarelto.  Appreciate assistance from [place], Etter Sjogren.  She reports she is doing "okay" today.  She states chemotherapy is going well every 2 weeks.  She is checking her BP at home at reports it was been around 130/80.  She reports no other medication changes.  Encouraged compliance with all medications.  Patient verbalizes understanding.  Encouraged patient to call if needs arise.  Thank you for allowing [facility] pharmacy to be involved in this patient's care.  PLAN: -Will follow up as necessary for PAP assistance.   Regina Eck, PharmD, [place]  [phone]

## 2018-10-29 NOTE — Patient Outreach (Signed)
[place] [facility]) Care Management  10/29/2018  [place] 02/01/1945 [id number]                                       Medication Assistance Referral  Referral From: [facility] RPh Jenne Pane.  Medication/Company: [name] Patient application portion:  Mailed Provider application portion: Faxed  to Dr. Corky Downs Provider address/fax verified via: Office website  Follow up:  Will follow up with patient in 7-10 business days to confirm application(s) have been received.  Maud Deed Chana Bode Snyder Certified Pharmacy Technician [place] Management Direct [phone]

## 2018-10-30 ENCOUNTER — Ambulatory Visit: Payer: Self-pay | Admitting: Pharmacist

## 2018-11-01 ENCOUNTER — Other Ambulatory Visit: Payer: Self-pay | Admitting: Family Medicine

## 2018-11-01 ENCOUNTER — Encounter: Payer: Self-pay | Admitting: Hematology and Oncology

## 2018-11-01 DIAGNOSIS — Z1231 Encounter for screening mammogram for malignant neoplasm of breast: Secondary | ICD-10-CM

## 2018-11-01 MED ORDER — RIVAROXABAN 20 MG PO TABS: 20.0000 mg | ORAL_TABLET | Freq: Every day | ORAL | 0 refills | Status: DC

## 2018-11-04 ENCOUNTER — Encounter: Payer: Self-pay | Admitting: Gynecology

## 2018-11-05 ENCOUNTER — Other Ambulatory Visit: Payer: PPO

## 2018-11-05 ENCOUNTER — Telehealth: Payer: Self-pay | Admitting: Hematology and Oncology

## 2018-11-05 ENCOUNTER — Other Ambulatory Visit: Payer: Self-pay

## 2018-11-05 ENCOUNTER — Inpatient Hospital Stay: Payer: PPO

## 2018-11-05 ENCOUNTER — Inpatient Hospital Stay (HOSPITAL_BASED_OUTPATIENT_CLINIC_OR_DEPARTMENT_OTHER): Payer: PPO | Admitting: Hematology and Oncology

## 2018-11-05 ENCOUNTER — Encounter: Payer: Self-pay | Admitting: Hematology and Oncology

## 2018-11-05 VITALS — BP 121/71 | HR 59 | Temp 98.2°F | Resp 18 | Ht 62.0 in | Wt 141.4 lb

## 2018-11-05 VITALS — BP 131/72 | HR 60

## 2018-11-05 DIAGNOSIS — C561 Malignant neoplasm of right ovary: Secondary | ICD-10-CM

## 2018-11-05 DIAGNOSIS — M79606 Pain in leg, unspecified: Secondary | ICD-10-CM

## 2018-11-05 DIAGNOSIS — D61818 Other pancytopenia: Secondary | ICD-10-CM | POA: Diagnosis not present

## 2018-11-05 DIAGNOSIS — L271 Localized skin eruption due to drugs and medicaments taken internally: Secondary | ICD-10-CM | POA: Insufficient documentation

## 2018-11-05 DIAGNOSIS — C569 Malignant neoplasm of unspecified ovary: Secondary | ICD-10-CM

## 2018-11-05 DIAGNOSIS — Z7189 Other specified counseling: Secondary | ICD-10-CM

## 2018-11-05 DIAGNOSIS — I10 Essential (primary) hypertension: Secondary | ICD-10-CM

## 2018-11-05 DIAGNOSIS — Z5112 Encounter for antineoplastic immunotherapy: Secondary | ICD-10-CM | POA: Diagnosis not present

## 2018-11-05 LAB — CBC WITH DIFFERENTIAL (CANCER CENTER ONLY)
Abs Immature Granulocytes: 0.02 10*3/uL (ref 0.00–0.07)
Basophils Absolute: 0 10*3/uL (ref 0.0–0.1)
Basophils Relative: 1 %
Eosinophils Absolute: 0.4 10*3/uL (ref 0.0–0.5)
Eosinophils Relative: 8 %
HCT: 35.7 % — ABNORMAL LOW (ref 36.0–46.0)
Hemoglobin: 12.1 g/dL (ref 12.0–15.0)
Immature Granulocytes: 0 %
Lymphocytes Relative: 24 %
Lymphs Abs: 1.4 10*3/uL (ref 0.7–4.0)
MCH: 31.1 pg (ref 26.0–34.0)
MCHC: 33.9 g/dL (ref 30.0–36.0)
MCV: 91.8 fL (ref 80.0–100.0)
Monocytes Absolute: 0.3 10*3/uL (ref 0.1–1.0)
Monocytes Relative: 5 %
Neutro Abs: 3.5 10*3/uL (ref 1.7–7.7)
Neutrophils Relative %: 62 %
Platelet Count: 132 10*3/uL — ABNORMAL LOW (ref 150–400)
RBC: 3.89 MIL/uL (ref 3.87–5.11)
RDW: 13.8 % (ref 11.5–15.5)
WBC Count: 5.6 10*3/uL (ref 4.0–10.5)
nRBC: 0 % (ref 0.0–0.2)

## 2018-11-05 LAB — TOTAL PROTEIN, URINE DIPSTICK: Protein, ur: NEGATIVE mg/dL

## 2018-11-05 LAB — CMP (CANCER CENTER ONLY)
ALT: 11 U/L (ref 0–44)
AST: 14 U/L — ABNORMAL LOW (ref 15–41)
Albumin: 3.4 g/dL — ABNORMAL LOW (ref 3.5–5.0)
Alkaline Phosphatase: 72 U/L (ref 38–126)
Anion gap: 10 (ref 5–15)
BUN: 17 mg/dL (ref 8–23)
CO2: 26 mmol/L (ref 22–32)
Calcium: 9.3 mg/dL (ref 8.9–10.3)
Chloride: 99 mmol/L (ref 98–111)
Creatinine: 0.74 mg/dL (ref 0.44–1.00)
GFR, Est AFR Am: >60 mL/min (ref >60)
GFR, Estimated: >60 mL/min (ref >60)
Glucose, Bld: 80 mg/dL (ref 70–99)
Potassium: 3.6 mmol/L (ref 3.5–5.1)
Sodium: 135 mmol/L (ref 135–145)
Total Bilirubin: 0.4 mg/dL (ref 0.3–1.2)
Total Protein: 6.9 g/dL (ref 6.5–8.1)

## 2018-11-05 MED ORDER — SODIUM CHLORIDE 0.9 % IV SOLN
10.0000 mg/kg | Freq: Once | INTRAVENOUS | Status: AC
  Administered 2018-11-05: 600 mg via INTRAVENOUS
  Filled 2018-11-05: qty 16

## 2018-11-05 MED ORDER — SPIRONOLACTONE 25 MG PO TABS: 25.0000 mg | ORAL_TABLET | Freq: Every day | ORAL | 11 refills | Status: DC

## 2018-11-05 MED ORDER — SODIUM CHLORIDE 0.9% FLUSH
10.0000 mL | INTRAVENOUS | Status: DC | PRN
  Administered 2018-11-05: 10 mL
  Filled 2018-11-05: qty 10

## 2018-11-05 MED ORDER — SODIUM CHLORIDE 0.9% FLUSH
10.0000 mL | Freq: Once | INTRAVENOUS | Status: AC
  Administered 2018-11-05: 11:00:00 10 mL
  Filled 2018-11-05: qty 10

## 2018-11-05 MED ORDER — SODIUM CHLORIDE 0.9 % IV SOLN
Freq: Once | INTRAVENOUS | Status: AC
  Administered 2018-11-05: 12:00:00 via INTRAVENOUS
  Filled 2018-11-05: qty 250

## 2018-11-05 MED ORDER — HEPARIN SOD (PORK) LOCK FLUSH 100 UNIT/ML IV SOLN
500.0000 [IU] | Freq: Once | INTRAVENOUS | Status: AC | PRN
  Administered 2018-11-05: 500 [IU]
  Filled 2018-11-05: qty 5

## 2018-11-05 MED ORDER — RIVAROXABAN 20 MG PO TABS: 20.0000 mg | ORAL_TABLET | Freq: Every day | ORAL | 0 refills | Status: DC

## 2018-11-05 NOTE — Patient Instructions (Signed)

## 2018-11-05 NOTE — Assessment & Plan Note (Signed)
The patient continues to have questions about rising tumor marker in the absence of symptoms Her recent CT imaging did not show gross disease The patient tolerated treatment poorly with hypertension, leg swelling and hand-foot syndrome We will proceed with bevacizumab today I plan to reduce the dose of doxorubicin for next cycle

## 2018-11-05 NOTE — Assessment & Plan Note (Signed)
She have signs of hand-foot syndrome secondary to Doxil I will plan to reduce the dose of chemotherapy next cycle For now, continue supportive care

## 2018-11-05 NOTE — Telephone Encounter (Signed)
Follow Up    *STAT* If patient is at the pharmacy, call can be transferred to refill team.   1. Which medications need to be refilled? (please list name of each medication and dose if known) rivaroxaban (XARELTO) 20 MG TABS tablet  2. Which pharmacy/location (including street and city if local pharmacy) is medication to be sent to? [place], [place] [id number]  3. Do they need a 30 day or 90 day supply? 30 day

## 2018-11-05 NOTE — Assessment & Plan Note (Signed)
She has mild intermittent blood count changes Her mild thrombocytopenia is likely due to treatment Observe only for now

## 2018-11-05 NOTE — Telephone Encounter (Signed)
Scheduled appt per 10/26 sch message -pt to get an updated schedule in the treatment area.

## 2018-11-05 NOTE — Assessment & Plan Note (Signed)
Even though her blood pressure is well controlled, she have extensive swelling I plan to hold amlodipine I will continue hydrochlorothiazide but plan to add spironolactone to help with the fluid retention

## 2018-11-05 NOTE — Patient Instructions (Signed)
[facility] Discharge Instructions for Patients Receiving Chemotherapy  Today you received the following chemotherapy agents Bevacizumab (AVASTIN).  To help prevent nausea and vomiting after your treatment, we encourage you to take your nausea medication as prescribed.   If you develop nausea and vomiting that is not controlled by your nausea medication, call the clinic.   BELOW ARE SYMPTOMS THAT SHOULD BE REPORTED IMMEDIATELY:  *FEVER GREATER THAN 100.5 F  *CHILLS WITH OR WITHOUT FEVER  NAUSEA AND VOMITING THAT IS NOT CONTROLLED WITH YOUR NAUSEA MEDICATION  *UNUSUAL SHORTNESS OF BREATH  *UNUSUAL BRUISING OR BLEEDING  TENDERNESS IN MOUTH AND THROAT WITH OR WITHOUT PRESENCE OF ULCERS  *URINARY PROBLEMS  *BOWEL PROBLEMS  UNUSUAL RASH Items with * indicate a potential emergency and should be followed up as soon as possible.  Feel free to call the clinic should you have any questions or concerns. The clinic phone number is [phone].  Please show the CHEMO ALERT CARD at check-in to the Emergency Department and triage nurse.   Coronavirus (COVID-19) Are you at risk?  Are you at risk for the Coronavirus (COVID-19)?  To be considered HIGH RISK for Coronavirus (COVID-19), you have to meet the following criteria:  . Traveled to [place], [place], [place], [place] or [place]; or in the [place] to [place], [place], [place], or [place]; and have fever, cough, and shortness of breath within the last 2 weeks of travel OR . Been in close contact with a person diagnosed with COVID-19 within the last 2 weeks and have fever, cough, and shortness of breath . IF YOU DO NOT MEET THESE CRITERIA, YOU ARE CONSIDERED LOW RISK FOR COVID-19.  What to do if you are HIGH RISK for COVID-19?  . If you are having a medical emergency, call 911. . Seek medical care right away. Before you go to a doctor's office, urgent care or emergency department, call ahead and tell them  about your recent travel, contact with someone diagnosed with COVID-19, and your symptoms. You should receive instructions from your physician's office regarding next steps of care.  . When you arrive at healthcare provider, tell the healthcare staff immediately you have returned from visiting [place], [place], [place], [place] or [place]; or traveled in the [place] to [place], [place], [place], or [place]; in the last two weeks or you have been in close contact with a person diagnosed with COVID-19 in the last 2 weeks.   . Tell the health care staff about your symptoms: fever, cough and shortness of breath. . After you have been seen by a medical provider, you will be either: o Tested for (COVID-19) and discharged home on quarantine except to seek medical care if symptoms worsen, and asked to  - Stay home and avoid contact with others until you get your results (4-5 days)  - Avoid travel on public transportation if possible (such as bus, train, or airplane) or o Sent to the Emergency Department by EMS for evaluation, COVID-19 testing, and possible admission depending on your condition and test results.  What to do if you are LOW RISK for COVID-19?  Reduce your risk of any infection by using the same precautions used for avoiding the common cold or flu:  . Wash your hands often with soap and warm water for at least 20 seconds.  If soap and water are not readily available, use an alcohol-based hand sanitizer with at least 60% alcohol.  . If coughing   sneezing, cover your mouth and nose by coughing or sneezing into the elbow areas of your shirt or coat, into a tissue or into your sleeve (not your hands). . Avoid shaking hands with others and consider head nods or verbal greetings only. . Avoid touching your eyes, nose, or mouth with unwashed hands.  . Avoid close contact with people who are sick. . Avoid places or events with large numbers of people in one location, like concerts or  sporting events. . Carefully consider travel plans you have or are making. . If you are planning any travel outside or inside the [place], visit the CDC's Travelers' Health webpage for the latest health notices. . If you have some symptoms but not all symptoms, continue to monitor at home and seek medical attention if your symptoms worsen. . If you are having a medical emergency, call 911.   [place] / e-Visit: [email or web address]         [facility]: [facility]: [phone]                   [facility]: [phone]

## 2018-11-05 NOTE — Progress Notes (Signed)
[facility] OFFICE PROGRESS NOTE  Patient Care Team: Martinique, Ezell G, MD as PCP - General (Family Medicine) Troy Sine, MD as PCP - Cardiology (Cardiology) Awanda Mink Craige Cotta, RN as Oncology Nurse Navigator (Oncology) Lavera Guise, [facility] as [place] Management (Pharmacist) Adaline Sill, CPhT as [place] Management (Pharmacy Technician)  ASSESSMENT & PLAN:  Right ovarian epithelial cancer [facility]) The patient continues to have questions about rising tumor marker in the absence of symptoms Her recent CT imaging did not show gross disease The patient tolerated treatment poorly with hypertension, leg swelling and hand-foot syndrome We will proceed with bevacizumab today I plan to reduce the dose of doxorubicin for next cycle  Essential hypertension, benign Even though her blood pressure is well controlled, she have extensive swelling I plan to hold amlodipine I will continue hydrochlorothiazide but plan to add spironolactone to help with the fluid retention  Hand foot syndrome She have signs of hand-foot syndrome secondary to Doxil I will plan to reduce the dose of chemotherapy next cycle For now, continue supportive care  Pancytopenia, acquired ([place]) She has mild intermittent blood count changes Her mild thrombocytopenia is likely due to treatment Observe only for now   No orders of the defined types were placed in this encounter.   INTERVAL HISTORY: Please see below for problem oriented charting. She returns for treatment She have concerns about rising CA-125, leg edema, fluid retention, hand-foot syndrome and others She continues to have mild abdominal discomfort but denies changes in bowel habits No recent bleeding Denies recent cough, chest pain or shortness of breath  SUMMARY OF ONCOLOGIC HISTORY: Oncology History Overview Note  Neg for genetics blood work and HRD   Right ovarian epithelial cancer ([place])   01/06/2018 Imaging   Ct abdomen and pelvis 1. Complex cystic mass at the right adnexa, measuring 5.9 x 4.0 cm, with nodular components, concerning for primary ovarian malignancy. 2. Diffuse nodularity along the omentum at the left side of the abdomen, extending into the mesentery at the left mid abdomen, concerning for peritoneal carcinomatosis. 3. Wall thickening at the distal ileum adjacent to the ovarian mass; bowel loops appear somewhat adherent to the ovarian mass. Bowel infiltration with tumor cannot be excluded. No evidence of bowel obstruction at this time. 4. Small volume ascites within the abdomen and pelvis.  Aortic Atherosclerosis (ICD10-I70.0).   01/08/2018 Tumor Marker   Patient's tumor was tested for the following markers: CA-125. Results of the tumor marker test revealed 3004   01/15/2018 Imaging   Chest CT:  1. No active cardiopulmonary disease. 2. Aortic atherosclerosis without aneurysm or dissection. 3. No large central pulmonary embolus.  CT AP:  1. Dilated fluid-filled loops of small bowel are redemonstrated slightly more extensive than on prior exam with transition point likely in the right adnexa adjacent to a complex cystic mass concerning for ovarian neoplasm given septations and soft tissue nodularity. This raises concern for early or partial SBO. This soft tissue mass measures 4.9 x 4 x 4.7 cm and has not changed since prior recent comparison. Additional short segmental area of luminal narrowing is noted in the right lower quadrant involving small bowel for which stigmata of peritoneal carcinomatosis or small-bowel metastatic implants might account for this. 2. Redemonstration of small volume of ascites predominantly in the upper abdomen surrounding the liver and spleen. 3. Redemonstration of thick bandlike omental thickening concerning for peritoneal carcinomatosis.    01/15/2018 Procedure   Successful ultrasound-guided  diagnostic and therapeutic  paracentesis yielding 1.5 liters of peritoneal fluid.    01/15/2018 Pathology Results   PERITONEAL/ASCITIC FLUID (SPECIMEN 1 OF 1 COLLECTED 01/18/18): MALIGNANT CELLS CONSISTENT WITH METASTATIC ADENOCARCINOMA. SEE COMMENT. COMMENT: THE MALIGNANT CELLS ARE POSITIVE FOR MOC-31, CYTOKERATIN 7, ESTROGEN RECEPTOR, PAX-8, AND WT-1. THEY ARE NEGATIVE FOR CALRETININ, CYTOKERATIN 5/6, AND CYTOKERATIN 20. THE PROFILE IS CONSISTENT WITH A PRIMARY GYNECOLOGIC CARCINOMA. THERE IS LIKELY SUFFICIENT TUMOR PRESENT, IF ADDITIONAL STUDIES ARE REQUESTED.   01/15/2018 - 02/02/2018 Hospital Admission   She was admitted to the hospital for SBO. She was treated with chemotherapy   01/18/2018 - 06/20/2018 Chemotherapy   The patient had carboplatin and taxol   02/08/2018 Cancer Staging   Staging form: Ovary, Fallopian Tube, and Primary Peritoneal Carcinoma, AJCC 8th Edition - Clinical: cT3, cN0, cM0 - Signed by Heath Lark, MD on 02/08/2018   02/08/2018 Tumor Marker   Patient's tumor was tested for the following markers: CA-125. Results of the tumor marker test revealed 445   03/02/2018 Tumor Marker   Patient's tumor was tested for the following markers: CA-125. Results of the tumor marker test revealed 174   03/15/2018 Imaging   6.2 cm complex cystic right ovarian mass, compatible with malignant ovarian neoplasm, mildly progressive.  Associated peritoneal disease/omental caking beneath the anterior abdominal wall, mildly improved. Prior abdominal ascites is improved/resolved.  4.7 cm cystic left ovarian mass, without overt malignant features, grossly unchanged.  Mild bilateral hydroureteronephrosis, secondary to extrinsic compression, new.  Prior small bowel obstruction has improved/resolved.    03/27/2018 Pathology Results   1. Omentum, resection for tumor - OMENTUM: - HIGH GRADE SEROUS CARCINOMA. 2. Ovary, left - LEFT OVARY: - HIGH GRADE SEROUS CARCINOMA. - LEFT FALLOPIAN TUBE: - HIGH GRADE SEROUS  CARCINOMA. 3. Adnexa - ovary +/- tube, neoplastic, right - RIGHT OVARY: - HIGH GRADE SEROUS CARCINOMA, 5.5 CM. - RIGHT FALLOPIAN TUBE: - HIGH GRADE SEROUS CARCINOMA. 4. Peritoneum, biopsy, nodule - PERITONEUM: - NODULES OF HIGH GRADE SEROUS CARCINOMA. 5. Peritoneum, resection for tumor, rectosigmoid - RECTOSIGMOID PERITONEUM: - HIGH GRADE SEROUS CARCINOMA. Microscopic Comment 3. OVARY or FALLOPIAN TUBE or PRIMARY PERITONEUM: Procedure: Bilateral salpingo-oophorectomy, omentectomy and peritoneal biopsies. Specimen Integrity: N/A. Tumor Site: Right ovary. Ovarian Surface Involvement (required only if applicable): Yes. Fallopian Tube Surface Involvement (required only if applicable): Yes. Tumor Size: 5.5 x 4.8 x 4.0 cm. Histologic Type: Serous carcinoma. Histologic Grade: High grade. Implants (required for advanced stage serous/seromucinous borderline tumors only): Omentum, left ovary and fallopian tube, rectosigmoid peritoneum and peritoneum. Other Tissue/ Organ Involvement: Left ovary and fallopian tube, omentum and rectosigmoid peritoneum. Largest Extrapelvic Peritoneal Focus (required only if applicable): 45.4 cm, omentum. Peritoneal/Ascitic Fluid: N/A. Treatment Effect (required only for high-grade serous carcinomas): Minimal. Regional Lymph Nodes: No lymph nodes submitted. Pathologic Stage Classification (pTNM, AJCC 8th Edition): pT3c, pNX. Representative Tumor Block: 3A, 3B, 3D, 3E and 45F. Comment(s): There is a 5.5 cm in greatest dimension partially cystic high grade serous carcinoma involving the right adnexal specimen and the tumor is staged as a primary right ovarian carcinoma. The carcinoma also involves the left ovary as well as the left fallopian tube, the omentum, peritoneum and the rectosigmoid peritoneal specimens.   03/27/2018 Surgery   Preoperative Diagnosis: ovarian cancer, stage IIIC, metastatic to omentum, peritoneum, serosa of intestines   Procedure(s)  Performed: 1. Exploratory laparotomy with bilateral salpingo-oophorectomy, omentectomy radical tumor debulking for ovarian cancer .  Surgeon: Thereasa Solo, MD.   Specimens: Bilateral tubes / ovaries, omentum. Peritoneal  nodules, rectosigmoid nodules.    Operative Findings: omental cake, miliary studding on diaphragm and bilateral paracolic gutters. Sigmoid colon densely adherent to left and right ovaries with tumor rind, ureters mildly dilated bilaterally due to retroperitoneal extension of the tumor towards ureters causing compression.    This represented an optimal cytoreduction (R1) with gross residual disease on the diaphragm that had been ablated and a thin tumor plaque on the sigmoid colon that was ablated. No residual disease >1cm remaining   05/07/2018 Genetic Testing   Negative genetic testing on the [employer] TumorNextHRD+ CancerNext Panel. The CancerNext gene panel offered by [employer] includes sequencing and rearrangement analysis for the following 34 genes:   APC, ATM, BARD1, BMPR1A, BRCA1, BRCA2, BRIP1, CDH1, CDK4, CDKN2A, CHEK2, DICER1, EPCAM, GREM1, HOXB13, MLH1, MRE11A, MSH2, MSH6, MUTYH, NBN, NF1, PALB2, PMS2, POLD1, POLE, PTEN, RAD50, RAD51C, RAD51D, SMAD4, SMARCA4, STK11, and TP53. Somatic genes analyzed through TumorNext-HRD: ATM, BARD1, BRCA1, BRCA2, BRIP1, CHEK2, MRE11A, NBN, PALB2, RAD51C, RAD51D. The report date is 05/07/2018.   05/11/2018 Tumor Marker   Patient's tumor was tested for the following markers: CA-125. Results of the tumor marker test revealed 60.5   07/02/2018 Imaging   1. Mild right pericardiophrenic adenopathy is mildly increased, suspicious for metastatic nodes. No abdominopelvic adenopathy. 2. Small left peritoneal soft tissue nodule adjacent to the splenic flexure and smooth left pelvic sidewall peritoneal thickening, cannot exclude residual/recurrent disease. Attention on follow-up CT advised. 3. Bilateral renal collecting system dilatation has  improved. 4.  Aortic Atherosclerosis (ICD10-I70.0).   07/02/2018 Tumor Marker   Patient's tumor was tested for the following markers: CA-125 Results of the tumor marker test revealed 42.7   07/11/2018 Echocardiogram   1. The left ventricle has normal systolic function with an ejection fraction of 60-65%. The cavity size was normal. Left ventricular diastolic parameters were normal.  2. Normal GLS -20.1.  3. The right ventricle has normal systolic function. The cavity was normal. There is no increase in right ventricular wall thickness.  4. The mitral valve is degenerative. Moderate thickening of the mitral valve leaflet. Moderate calcification of the mitral valve leaflet.  5. The aortic valve is tricuspid. Moderate thickening of the aortic valve. Moderate calcification of the aortic valve. Aortic valve regurgitation is trivial by color flow Doppler.  6. The aortic root is normal in size and structure.    Chemotherapy   The patient had doxorubicin and bevacizumab for chemotherapy treatment.     07/30/2018 Tumor Marker   Patient's tumor was tested for the following markers: CA-125 Results of the tumor marker test revealed 82.6   08/27/2018 Tumor Marker   Patient's tumor was tested for the following markers: CA-125 Results of the tumor marker test revealed 111.   09/24/2018 Tumor Marker   Patient's tumor was tested for the following markers: CA-125 Results of the tumor marker test revealed 142.   10/05/2018 Imaging   1. Stable right juxta diaphragmatic/pericardiophrenic lymph node, mildly enlarged. 2. Otherwise no definite metastatic disease identified in the abdomen/pelvis. 3. 3 mm peritoneal nodule noted left paracolic gutter on today's study, indeterminate. Attention on follow-up recommended. 4.  Aortic Atherosclerois (ICD10-170.0)   10/16/2018 Echocardiogram    1. No significant change from prior study (07/11/2018).  2. Left ventricular ejection fraction, by visual estimation, is 60 to  65%. The left ventricle has normal function. Normal left ventricular size. There is no left ventricular hypertrophy.  3. LVEF by 3D assessment 63%.  4. The average left ventricular global longitudinal strain  is -22.4 %.  5. Global right ventricle has normal systolic function.The right ventricular size is normal. No increase in right ventricular wall thickness.  6. Left atrial size was normal.  7. Right atrial size was normal.  8. Presence of pericardial fat pad.  9. Moderate thickening of the mitral valve leaflet(s). 10. The mitral valve is normal in structure. Trace mitral valve regurgitation. 11. The tricuspid valve is normal in structure. Tricuspid valve regurgitation is mild. 12. Aortic regurgitation PHT measures 467 msec. 13. The aortic valve is tricuspid Aortic valve regurgitation is mild by color flow Doppler. Mild aortic valve sclerosis without stenosis. 14. There is mild to moderate calcification of the AoV, with focal calcification of the [place]. The aortic regurgitation is mild in severity, likely related to degenerative valve disease. 15. The pulmonic valve was grossly normal. Pulmonic valve regurgitation is not visualized by color flow Doppler. 16. Mild plaque invoving the ascending aorta. 17. Normal pulmonary artery systolic pressure. 18. The tricuspid regurgitant velocity is 2.52 m/s, and with an assumed right atrial pressure of 3 mmHg, the estimated right ventricular systolic pressure is normal at 28.4 mmHg.   10/26/2018 Tumor Marker   Patient's tumor was tested for the following markers: CA-125 Results of the tumor marker test revealed 196     REVIEW OF SYSTEMS:   Constitutional: Denies fevers, chills or abnormal weight loss Eyes: Denies blurriness of vision Ears, nose, mouth, throat, and face: Denies mucositis or sore throat Respiratory: Denies cough, dyspnea or wheezes Cardiovascular: Denies palpitation, chest discomfort or lower extremity swelling Gastrointestinal:   Denies nausea, heartburn or change in bowel habits Skin: Denies abnormal skin rashes Lymphatics: Denies new lymphadenopathy or easy bruising Neurological:Denies numbness, tingling or new weaknesses Behavioral/Psych: Mood is stable, no new changes  All other systems were reviewed with the patient and are negative.  I have reviewed the past medical history, past surgical history, social history and family history with the patient and they are unchanged from previous note.  ALLERGIES:  is allergic to tramadol hcl.  MEDICATIONS:  Current Outpatient Medications  Medication Sig Dispense Refill  . acetaminophen (TYLENOL) 500 MG tablet Take 2 tablets (1,000 mg total) by mouth every 6 (six) hours. (Patient taking differently: Take 1,000 mg by mouth every 6 (six) hours as needed. ) 30 tablet 0  . ALPRAZolam (XANAX) 0.5 MG tablet Take 0.5-1 tablets (0.25-0.5 mg total) by mouth daily as needed for anxiety. 30 tablet 2  . alum & mag hydroxide-simeth (MAALOX/MYLANTA) 200-200-20 MG/5ML suspension Take 30 mLs by mouth every 6 (six) hours as needed for indigestion or heartburn. 355 mL 0  . amLODipine (NORVASC) 10 MG tablet Take 1 tablet (10 mg total) by mouth daily. 90 tablet 3  . Calcium Carbonate (CALTRATE 600) 1500 MG TABS Take 600 mg of elemental calcium by mouth daily.     . cholecalciferol (VITAMIN D) 1000 UNITS tablet Take 1,000 Units by mouth daily.     . fluticasone (FLONASE) 50 MCG/ACT nasal spray Use 2 sprays in each nostril daily (Patient taking differently: Place 2 sprays into both nostrils daily. ) 48 g 2  . hydrochlorothiazide (HYDRODIURIL) 25 MG tablet Take 1 tablet (25 mg total) by mouth daily. 90 tablet 1  . ibuprofen (ADVIL,MOTRIN) 200 MG tablet Take 600 mg by mouth every 6 (six) hours as needed for moderate pain.     Marland Kitchen lactose free nutrition (BOOST PLUS) LIQD Take 237 mLs by mouth daily. (Patient taking differently: Take 237 mLs by mouth 2 (  two) times daily between meals. ) 10 Can 0  .  levothyroxine (SYNTHROID) 75 MCG tablet Take 1 tablet (75 mcg total) by mouth daily. 90 tablet 3  . lidocaine-prilocaine (EMLA) cream Apply to affected area once 30 g 3  . Magnesium 400 MG TABS Take 250 mg by mouth daily.    . Menthol-Methyl Salicylate (MUSCLE RUB) 10-15 % CREA Apply 1 application topically as needed for muscle pain.    . metoprolol succinate (TOPROL-XL) 50 MG 24 hr tablet Take 1 tablet (50 mg total) by mouth daily. Take with or immediately following a meal. 90 tablet 3  . Multiple Vitamins-Minerals (MULTIVITAMIN WITH MINERALS) tablet Take 1 tablet by mouth daily.      . Omega-3 Fatty Acids (FISH OIL) 1000 MG CAPS Take 2,000 mg by mouth 2 (two) times daily.     Marland Kitchen omeprazole (PRILOSEC) 40 MG capsule Take 1 capsule (40 mg total) by mouth daily. 90 capsule 3  . ondansetron (ZOFRAN) 8 MG tablet Take 1 tablet (8 mg total) by mouth every 8 (eight) hours as needed (Nausea or vomiting). 30 tablet 1  . oxyCODONE (OXY IR/ROXICODONE) 5 MG immediate release tablet Take 1 tablet (5 mg total) by mouth every 6 (six) hours as needed for severe pain. 30 tablet 0  . polyethylene glycol (MIRALAX / GLYCOLAX) packet Take 17 g by mouth daily. 14 each 0  . prochlorperazine (COMPAZINE) 10 MG tablet Take 1 tablet (10 mg total) by mouth every 6 (six) hours as needed (Nausea or vomiting). 30 tablet 1  . rivaroxaban (XARELTO) 20 MG TABS tablet Take 1 tablet (20 mg total) by mouth daily with supper. 30 tablet 0  . senna (SENOKOT) 8.6 MG TABS tablet Take 1 tablet (8.6 mg total) by mouth at bedtime as needed for mild constipation. 120 each 0  . senna-docusate (SENOKOT-S) 8.6-50 MG tablet Take 2 tablets by mouth at bedtime. For AFTER surgery (Patient not taking: Reported on 09/04/2018) 30 tablet 0  . simvastatin (ZOCOR) 20 MG tablet Take 1 tablet (20 mg total) by mouth at bedtime. 90 tablet 3  . spironolactone (ALDACTONE) 25 MG tablet Take 1 tablet (25 mg total) by mouth daily. 30 tablet 11  . vitamin C (ASCORBIC  ACID) 500 MG tablet Take 1,000 mg by mouth daily.     No current facility-administered medications for this visit.    Facility-Administered Medications Ordered in Other Visits  Medication Dose Route Frequency Provider Last Rate Last Dose  . sodium chloride flush (NS) 0.9 % injection 10 mL  10 mL Intracatheter PRN Alvy Bimler, Jaileigh Weimer, MD   10 mL at 11/05/18 1242    PHYSICAL EXAMINATION: ECOG PERFORMANCE STATUS: 1 - Symptomatic but completely ambulatory  Vitals:   11/05/18 1058  BP: 121/71  Pulse: (!) 59  Resp: 18  Temp: 98.2 F (36.8 C)  SpO2: 100%   Filed Weights   11/05/18 1058  Weight: 141 lb 6.4 oz (64.1 kg)    GENERAL:alert, no distress and comfortable SKIN: Noted petechial changes on her anterior shin area EYES: normal, Conjunctiva are pink and non-injected, sclera clear OROPHARYNX:no exudate, no erythema and lips, buccal mucosa, and tongue normal  NECK: supple, thyroid normal size, non-tender, without nodularity LYMPH:  no palpable lymphadenopathy in the cervical, axillary or inguinal LUNGS: clear to auscultation and percussion with normal breathing effort HEART: regular rate & rhythm and no murmurs with mild to moderate lower extremity edema ABDOMEN:abdomen soft, non-tender and normal bowel sounds Musculoskeletal:no cyanosis of digits and no  clubbing  NEURO: alert & oriented x 3 with fluent speech, no focal motor/sensory deficits  LABORATORY DATA:  I have reviewed the data as listed    Component Value Date/Time   NA 135 11/05/2018 1039   K 3.6 11/05/2018 1039   CL 99 11/05/2018 1039   CO2 26 11/05/2018 1039   GLUCOSE 80 11/05/2018 1039   BUN 17 11/05/2018 1039   CREATININE 0.74 11/05/2018 1039   CREATININE 0.76 11/17/2015 1005   CALCIUM 9.3 11/05/2018 1039   PROT 6.9 11/05/2018 1039   ALBUMIN 3.4 (L) 11/05/2018 1039   AST 14 (L) 11/05/2018 1039   ALT 11 11/05/2018 1039   ALKPHOS 72 11/05/2018 1039   BILITOT 0.4 11/05/2018 1039   GFRNONAA >60 11/05/2018 1039    GFRAA >60 11/05/2018 1039    No results found for: SPEP, UPEP  Lab Results  Component Value Date   WBC 5.6 11/05/2018   NEUTROABS 3.5 11/05/2018   HGB 12.1 11/05/2018   HCT 35.7 (L) 11/05/2018   MCV 91.8 11/05/2018   PLT 132 (L) 11/05/2018      Chemistry      Component Value Date/Time   NA 135 11/05/2018 1039   K 3.6 11/05/2018 1039   CL 99 11/05/2018 1039   CO2 26 11/05/2018 1039   BUN 17 11/05/2018 1039   CREATININE 0.74 11/05/2018 1039   CREATININE 0.76 11/17/2015 1005      Component Value Date/Time   CALCIUM 9.3 11/05/2018 1039   ALKPHOS 72 11/05/2018 1039   AST 14 (L) 11/05/2018 1039   ALT 11 11/05/2018 1039   BILITOT 0.4 11/05/2018 1039      All questions were answered. The patient knows to call the clinic with any problems, questions or concerns. No barriers to learning was detected.  I spent 25 minutes counseling the patient face to face. The total time spent in the appointment was 30 minutes and more than 50% was on counseling and review of test results  Heath Lark, MD 11/05/2018 1:34 PM

## 2018-11-05 NOTE — Telephone Encounter (Signed)
1.  She does not need to see me for exam at this point as she is being seen by the other doctors. 2.  It has been 2 years since her last bone density.  I do not want her coming in at this point due to Covid to repeat it.  We can address this sometime next year 3.  As far as medication I have 2 thoughts: First is do nothing right now but readdress at going into next year to see how she is doing overall before adding another medication.  Second would be to consider medication now and maybe consider Prolia.  I would suggest that she ask her oncology doctors their opinion as far as whether they would think starting Prolia now would be prudent or would they recommend her to wait.  I am sure they can do it in their office if they think it is a good idea.

## 2018-11-09 ENCOUNTER — Encounter: Payer: PPO | Admitting: Family Medicine

## 2018-11-12 ENCOUNTER — Telehealth: Payer: Self-pay | Admitting: Oncology

## 2018-11-12 ENCOUNTER — Encounter: Payer: PPO | Admitting: Family Medicine

## 2018-11-12 ENCOUNTER — Encounter: Payer: Self-pay | Admitting: Hematology and Oncology

## 2018-11-12 NOTE — Telephone Encounter (Signed)
Genieva called and said she has not noticed any improvement in the swelling of her hands and feet since starting spironolactone on 11/07/18.  She said she thinks she is urinating less (denies any dysuria).  She said her BP is good. She is wondering if the spironolactone needs to be increased.  She also said her gums are swollen, sore and have white spots on them.   She said she had nose bleeds the day before yesterday which have now stopped.  She continues to have foot/heal pain but said it has improved some.  Advised her that Dr. Alvy Bimler will be notified and that we will call her back.

## 2018-11-12 NOTE — Telephone Encounter (Signed)
Because of her age, we have to be careful I think it's ok to increase spironolactone to 50 mg but only to get her weight under 140. If she weighs less than 140 she needs to stop I don't know what to do with her gums Can she take a picture and send to mychart?

## 2018-11-12 NOTE — Telephone Encounter (Signed)
Haley [name] with message below from Dr. Alvy Bimler.  Discussed that she can take spironolactone 2 tablets of 25 mg to make 50 mg. Also discussed that she should take the spironolactone at a different time of day than metroprolol and HCTZ.  She verbalized understanding and agreement.

## 2018-11-13 ENCOUNTER — Encounter: Payer: Self-pay | Admitting: Family Medicine

## 2018-11-13 ENCOUNTER — Other Ambulatory Visit: Payer: Self-pay

## 2018-11-13 ENCOUNTER — Other Ambulatory Visit: Payer: Self-pay | Admitting: Pharmacy Technician

## 2018-11-13 ENCOUNTER — Telehealth: Payer: Self-pay

## 2018-11-13 ENCOUNTER — Ambulatory Visit (INDEPENDENT_AMBULATORY_CARE_PROVIDER_SITE_OTHER): Payer: PPO | Admitting: Family Medicine

## 2018-11-13 VITALS — BP 130/70 | HR 54 | Temp 97.6°F | Resp 16 | Ht 62.0 in | Wt 143.8 lb

## 2018-11-13 DIAGNOSIS — Z0000 Encounter for general adult medical examination without abnormal findings: Secondary | ICD-10-CM | POA: Diagnosis not present

## 2018-11-13 DIAGNOSIS — F419 Anxiety disorder, unspecified: Secondary | ICD-10-CM

## 2018-11-13 DIAGNOSIS — E039 Hypothyroidism, unspecified: Secondary | ICD-10-CM | POA: Diagnosis not present

## 2018-11-13 DIAGNOSIS — I10 Essential (primary) hypertension: Secondary | ICD-10-CM | POA: Diagnosis not present

## 2018-11-13 DIAGNOSIS — E785 Hyperlipidemia, unspecified: Secondary | ICD-10-CM | POA: Diagnosis not present

## 2018-11-13 MED ORDER — MAGIC MOUTHWASH W/LIDOCAINE: 5.0000 mL | Freq: Four times a day (QID) | ORAL | 0 refills | Status: DC

## 2018-11-13 NOTE — Progress Notes (Signed)
HPI:   Haley [name] is a 73 y.o. female, who is here today for her routine physical and AWV.  Last CPE: 11/04/18  Regular exercise 3 or more time per week: She does not exercise regularly but she is active, household activities. Following a healthy diet: Yes. She lives with her husband.  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  Immunization History  Administered Date(s) Administered   Pneumococcal Conjugate-13 11/03/2017   Pneumococcal Polysaccharide-23 09/10/2009   Td 10/03/2001   Zoster 08/13/2013    Mammogram: 12/15/2017. Colonoscopy: 01/2012. DEXA: 05/19/2016.  Hep C screening: 10/2016 NR.  She has no concerns today. She is following with dentist , she is having some dental issues,aphthas,and gum inflammation.  HTN: She is on Metoprolol Succinate 50 gm daily,Amlodipine 10 mg, HCTZ 25 mg daily,and Spironolactone 50 mg. Atrial fib on Xarelto 20 mg daily.  Recently added Spironolactone 6 days ago, increased to 2 tabs daily yesterday for fluid retention.  +Hand soreness and limitation of IP joint movement that has improved with medication.  Lab Results  Component Value Date   CREATININE 0.74 11/05/2018   BUN 17 11/05/2018   NA 135 11/05/2018   K 3.6 11/05/2018   CL 99 11/05/2018   CO2 26 11/05/2018    HLD: She is on Simvastatin 20 mg daily. Tolerating medication well.  Lab Results  Component Value Date   CHOL 157 11/03/2017   HDL 56.00 11/03/2017   LDLCALC 70 11/03/2017   LDLDIRECT 84.3 12/16/2010   TRIG 154.0 (H) 11/03/2017   CHOLHDL 3 11/03/2017   1/2 PPD cigars. She has quit in the past ,1990. She is not interested in smocking cessation.  Hypothyroidism: She is on Levothyroxine, recently adjusted. She is now on Levothyroxine  75 mcg T-T, rest of the week 1 tab.   Lab Results  Component  Value Date   TSH 0.217 (L) 10/22/2018   Anxiety on Alprazolam 0.5 mg 1/2 to 1 tab daily as needed.   Last AWV on 11/04/18. Independent ADL's and IADL's. No falls in the past year and denies depression symptoms.  Functional Status Survey: Is the patient deaf or have difficulty hearing?: Yes Does the patient have difficulty seeing, even when wearing glasses/contacts?: No Does the patient have difficulty concentrating, remembering, or making decisions?: No Does the patient have difficulty walking or climbing stairs?: No Does the patient have difficulty dressing or bathing?: No Does the patient have difficulty doing errands alone such as visiting a doctor's office or shopping?: No  Fall Risk  11/13/2018 02/21/2018 12/02/2015 10/24/2014 08/13/2013  Falls in the past year? 0 0 Yes No No  Number falls in past yr: 0 - 2 or more - -  Injury with Fall? 0 - No - -  Risk for fall due to : - Other (Comment) - - -  Risk for fall due to: Comment - Fatigue related to chemo treatments. - - -  Follow up Education provided Falls prevention discussed - - -   Providers she sees regularly:  Eye care provider: Dr Gershon Crane, 10/2018. Oncologist, Dr Lanice Schwab, 11/05/18 Dermatologist, DR [place] Crane. Cardiologist, Dr Claiborne Billings. Gyn, Dr Phineas Real.  Depression screen PHQ 2/9 11/13/2018  Decreased Interest 0  Down, Depressed, Hopeless 0  PHQ - 2 Score 0  Some recent data might be hidden    Mini-Cog - 11/13/18 2128    Normal clock drawing test?  yes  How many words correct?  3         Hearing Screening   125Hz  250Hz  500Hz  1000Hz  2000Hz  3000Hz  4000Hz  6000Hz  8000Hz   Right ear:           Left ear:           Vision Screening Comments: Refused, she just had eye exam a few weeks ago.  Review of Systems  Constitutional: Negative for appetite change, fatigue and fever.  HENT: Negative for hearing loss, mouth sores, sore throat and trouble swallowing.   Eyes: Negative for photophobia and redness.  Respiratory:  Negative for cough, shortness of breath and wheezing.   Cardiovascular: Negative for chest pain and leg swelling.  Gastrointestinal: Positive for nausea (Associated with chemo). Negative for abdominal pain and vomiting.       No changes in bowel habits.  Endocrine: Negative for cold intolerance, heat intolerance, polydipsia, polyphagia and polyuria.  Genitourinary: Negative for decreased urine volume, dysuria, hematuria, vaginal bleeding and vaginal discharge.  Musculoskeletal: Positive for arthralgias. Negative for gait problem.  Skin: Negative for color change and rash.  Allergic/Immunologic: Positive for environmental allergies.  Neurological: Negative for syncope, weakness and headaches.  Psychiatric/Behavioral: Negative for confusion. The patient is nervous/anxious.   All other systems reviewed and are negative.   Current Outpatient Medications on File Prior to Visit  Medication Sig Dispense Refill   acetaminophen (TYLENOL) 500 MG tablet Take 2 tablets (1,000 mg total) by mouth every 6 (six) hours. (Patient taking differently: Take 1,000 mg by mouth every 6 (six) hours as needed. ) 30 tablet 0   ALPRAZolam (XANAX) 0.5 MG tablet Take 0.5-1 tablets (0.25-0.5 mg total) by mouth daily as needed for anxiety. 30 tablet 2   alum & mag hydroxide-simeth (MAALOX/MYLANTA) 200-200-20 MG/5ML suspension Take 30 mLs by mouth every 6 (six) hours as needed for indigestion or heartburn. 355 mL 0   amLODipine (NORVASC) 10 MG tablet Take 1 tablet (10 mg total) by mouth daily. 90 tablet 3   Calcium Carbonate (CALTRATE 600) 1500 MG TABS Take 600 mg of elemental calcium by mouth daily.      cholecalciferol (VITAMIN D) 1000 UNITS tablet Take 1,000 Units by mouth daily.      fluticasone (FLONASE) 50 MCG/ACT nasal spray Use 2 sprays in each nostril daily (Patient taking differently: Place 2 sprays into both nostrils daily. ) 48 g 2   hydrochlorothiazide (HYDRODIURIL) 25 MG tablet Take 1 tablet (25 mg  total) by mouth daily. 90 tablet 1   ibuprofen (ADVIL,MOTRIN) 200 MG tablet Take 600 mg by mouth every 6 (six) hours as needed for moderate pain.      lactose free nutrition (BOOST PLUS) LIQD Take 237 mLs by mouth daily. (Patient taking differently: Take 237 mLs by mouth 2 (two) times daily between meals. ) 10 Can 0   levothyroxine (SYNTHROID) 75 MCG tablet Take 1 tablet (75 mcg total) by mouth daily. 90 tablet 3   lidocaine-prilocaine (EMLA) cream Apply to affected area once 30 g 3   Magnesium 400 MG TABS Take 250 mg by mouth daily.     Menthol-Methyl Salicylate (MUSCLE RUB) 10-15 % CREA Apply 1 application topically as needed for muscle pain.     metoprolol succinate (TOPROL-XL) 50 MG 24 hr tablet Take 1 tablet (50 mg total) by mouth daily. Take with or immediately following a meal. 90 tablet 3   Multiple Vitamins-Minerals (MULTIVITAMIN WITH MINERALS) tablet Take 1 tablet by mouth daily.  Omega-3 Fatty Acids (FISH OIL) 1000 MG CAPS Take 2,000 mg by mouth 2 (two) times daily.      omeprazole (PRILOSEC) 40 MG capsule Take 1 capsule (40 mg total) by mouth daily. 90 capsule 3   ondansetron (ZOFRAN) 8 MG tablet Take 1 tablet (8 mg total) by mouth every 8 (eight) hours as needed (Nausea or vomiting). 30 tablet 1   oxyCODONE (OXY IR/ROXICODONE) 5 MG immediate release tablet Take 1 tablet (5 mg total) by mouth every 6 (six) hours as needed for severe pain. 30 tablet 0   polyethylene glycol (MIRALAX / GLYCOLAX) packet Take 17 g by mouth daily. 14 each 0   prochlorperazine (COMPAZINE) 10 MG tablet Take 1 tablet (10 mg total) by mouth every 6 (six) hours as needed (Nausea or vomiting). 30 tablet 1   rivaroxaban (XARELTO) 20 MG TABS tablet Take 1 tablet (20 mg total) by mouth daily with supper. 30 tablet 0   senna (SENOKOT) 8.6 MG TABS tablet Take 1 tablet (8.6 mg total) by mouth at bedtime as needed for mild constipation. 120 each 0   senna-docusate (SENOKOT-S) 8.6-50 MG tablet Take 2  tablets by mouth at bedtime. For AFTER surgery 30 tablet 0   simvastatin (ZOCOR) 20 MG tablet Take 1 tablet (20 mg total) by mouth at bedtime. 90 tablet 3   spironolactone (ALDACTONE) 25 MG tablet Take 1 tablet (25 mg total) by mouth daily. 30 tablet 11   vitamin C (ASCORBIC ACID) 500 MG tablet Take 1,000 mg by mouth daily.     No current facility-administered medications on file prior to visit.      Past Medical History:  Diagnosis Date   Allergy    SEASONAL   Anxiety    Cataract    BILATERAL   Dysrhythmia    Family history of lung cancer    Family history of prostate cancer    Family history of prostate cancer    Family history of uterine cancer    Fibromyalgia    GERD (gastroesophageal reflux disease)    H/O echocardiogram 04/28/08   EF>55% trace mitral regurgitation, No significant valvular pathology   Hemorrhoids    internal   History of stress test 02/08/2011   Normal Myocardial perfusion study, this is a low risk scan, No prior study available for comparison   Hyperlipidemia    Hypertension    hx of   Hypothyroidism    MVP (mitral valve prolapse)    mild and MR   OA (osteoarthritis)    Osteoporosis    ovarian ca dx'd 12/2017   ovarian cancer   Paroxysmal A-fib (HCC)    Sleep apnea    does not use prescribed CPAP  does not tolerate   Thyroid disease    HYPO   Varicosities of leg    Vitamin D deficiency     Past Surgical History:  Procedure Laterality Date   [place]   with hysterectomy   BILATERAL SALPINGECTOMY N/A 03/27/2018   Procedure: EXPLORATORY LAPARATOMY, BILATERAL SALPINGOOPHERECTOMY;  Surgeon: Everitt Amber, MD;  Location: [facility];  Service: Gynecology;  Laterality: N/A;   BREAST EXCISIONAL BIOPSY Left 1995   Benign   COLONOSCOPY  11-20-2000   DEBULKING N/A 03/27/2018   Procedure: RADICAL TUMOR DEBULKING;  Surgeon: Everitt Amber, MD;  Location: [facility];  Service: Gynecology;   Laterality: N/A;   IR IMAGING GUIDED PORT INSERTION  01/17/2018   NOSE SURGERY  1990  OMENTECTOMY N/A 03/27/2018   Procedure: OMENTECTOMY;  Surgeon: Everitt Amber, MD;  Location: [facility];  Service: Gynecology;  Laterality: N/A;    Allergies  Allergen Reactions   Tramadol Hcl     jittery    Family History  Problem Relation Age of Onset   Diabetes Daughter    Diabetes Mother    Hypertension Mother    Heart disease Mother    Stroke Maternal Grandmother    Lung cancer Niece    Cancer Paternal Aunt        type of cancer unk   Prostate cancer Paternal Uncle    Uterine cancer Cousin        dx under 39   Colon cancer Neg Hx     Social History   Socioeconomic History   Marital status: Married    Spouse name: Not on file   Number of children: Not on file   Years of education: Not on file   Highest education level: Not on file  Occupational History   Not on file  Social Needs   Financial resource strain: Not on file   Food insecurity    Worry: Not on file    Inability: Not on file   Transportation needs    Medical: Not on file    Non-medical: Not on file  Tobacco Use   Smoking status: Light Tobacco Smoker    Years: 10.00    Types: Cigars   Smokeless tobacco: Never Used   Tobacco comment: not daily  [place] cigars 1-2   Substance and Sexual Activity   Alcohol use: No    Alcohol/week: 0.0 standard drinks   Drug use: No   Sexual activity: Yes    Comment: 1st intercourse 17yo-1 partner  Lifestyle   Physical activity    Days per week: Not on file    Minutes per session: Not on file   Stress: Not on file  Relationships   Social connections    Talks on phone: Not on file    Gets together: Not on file    Attends religious service: Not on file    Active member of club or organization: Not on file    Attends meetings of clubs or organizations: Not on file    Relationship status: Not on file  Other Topics Concern   Not on file  Social  History Narrative   Not on file    Vitals:   11/13/18 0920  BP: 130/70  Pulse: (!) 54  Resp: 16  Temp: 97.6 F (36.4 C)  SpO2: 96%   Body mass index is 26.3 kg/m.   Wt Readings from Last 3 Encounters:  11/13/18 143 lb 12.8 oz (65.2 kg)  11/05/18 141 lb 6.4 oz (64.1 kg)  10/22/18 142 lb 3.2 oz (64.5 kg)    Physical Exam  Nursing note and vitals reviewed. Constitutional: She is oriented to person, place, and time. She appears well-developed and well-nourished. No distress.  HENT:  Head: Normocephalic and atraumatic.  Right Ear: Hearing, tympanic membrane, external ear and ear canal normal.  Left Ear: Hearing, tympanic membrane, external ear and ear canal normal.  Mouth/Throat: Uvula is midline, oropharynx is clear and moist and mucous membranes are normal. She has dentures.  Small aphthous on cheeks and mucosa of upper lip.  Eyes: Pupils are equal, round, and reactive to light. Conjunctivae and EOM are normal.  Neck: No tracheal deviation present.  Cardiovascular: Regular rhythm. Bradycardia present.  No murmur heard. Pulses:  Dorsalis pedis pulses are 2+ on the right side and 2+ on the left side.  Respiratory: Effort normal and breath sounds normal. No respiratory distress.  GI: Soft. She exhibits no mass. There is no hepatomegaly. There is no abdominal tenderness.  Genitourinary:    Genitourinary Comments: Breast: No masses,skin changes,or nipple discharge.   Musculoskeletal:        General: Edema (Trace pitting LE edema, bilateral.) present.     Comments: No signs of synovitis appreciated.  Lymphadenopathy:    She has no cervical adenopathy.    She has no axillary adenopathy.  Neurological: She is alert and oriented to person, place, and time. She has normal strength. No cranial nerve deficit. Gait normal.  Skin: Skin is warm. No rash noted. No erythema.  Psychiatric: Her mood appears anxious. Cognition and memory are normal.  Well groomed, poor eye contact.      ASSESSMENT AND PLAN:  Haley [name] was here today annual physical examination.   Orders Placed This Encounter  Procedures   TSH    Routine general medical examination at a health care facility Regular physical activity and healthful diet recommended. Preventive guidelines reviewed. Vaccination up to date. Ca++ and vit D supplementation recommended. Next CPE in a year.  Essential hypertension, benign BP adequately controlled. Mild bradycardia. She is on Metoprolol, she does not want to decrease dose for now. Instructed to monitor BP and HR regularly.  Acquired hypothyroidism She has cardiology appt latter this month,asked her to have TSH done with next blood work.  -     TSH; Future  Dyslipidemia Following with cardiologist. Per pt report,planning on having FLP next cardio appt. No changes in current management.  Anxiety Stable. No changes in current management. Side effects of Alprazolam discussed.  Medicare annual wellness visit, subsequent We discussed the importance of staying active, physically and mentally, as well as the benefits of a healthy/balnace diet. Low impact exercise that involve stretching and strengthing are ideal. Vaccines: Annual flu vaccine. We discussed preventive screening for the next 5-10 years, summery of recommendations given in AVS. Fall prevention. Given the fact she is on ongoing chemo treatment for ovarian cancer, I think she can hold on further screening. She wants to continue with annual mammograms, she has appt in 12/2018.  Advance directives and end of life discussed, she has POA and living will.    Return in 6 months (on 05/13/2019) for anxiety.    Hyla G. Martinique, MD  [facility]. [place] office.

## 2018-11-13 NOTE — Patient Outreach (Signed)
[facility]) Care Management  11/13/2018  [place] 09-Jul-1945 [id number]   Received patient portion(s) of patient assistance application(s) for Xarelto. Faxed completed application and required documents into [employer].  Will follow up with company(ies) in 7-10 business days to check status of application(s).  Maud Deed Chana Bode [name] Certified Pharmacy Technician [place] Management Direct [phone]

## 2018-11-13 NOTE — Telephone Encounter (Signed)
Called and given below message. She verbalized understanding. 

## 2018-11-13 NOTE — Patient Instructions (Addendum)
  Ms. Brautigam , Thank you for taking time to come for your Medicare Wellness Visit. I appreciate your ongoing commitment to your health goals. Please review the following plan we discussed and let me know if I can assist you in the future.   These are the goals we discussed: Goals   None     This is a list of the screening recommended for you and due dates:  Health Maintenance  Topic Date Due  . Flu Shot  09/25/2019*  . Tetanus Vaccine  10/08/2020*  . Mammogram  12/16/2019  . Colon Cancer Screening  10/19/2022  . DEXA scan (bone density measurement)  Completed  .  Hepatitis C: One time screening is recommended by Center for Disease Control  (CDC) for  adults born from [birth year] through 1966.   Completed  . Pneumonia vaccines  Completed  *Topic was postponed. The date shown is not the original due date.   A few things to remember from today's visit:   Routine general medical examination at a health care facility  Essential hypertension, benign  Acquired hypothyroidism  Dyslipidemia  Anxiety  Medicare annual wellness visit, subsequent  Check blood pressure and pulse at home. Pulse mildly low,could be aggravated by Metoprolol,so we need to consider decreasing dose.  I am adding TSH to labs, hopefully it can be done with labs order by your heart doctor.  Please be sure medication list is accurate. If a new problem present, please set up appointment sooner than planned today.

## 2018-11-13 NOTE — Telephone Encounter (Signed)
-----   Message from Heath Lark, MD sent at 11/13/2018  7:48 AM EST ----- Regarding: mucositis I cannot appreciate the pictures she sent Can you call in Magic mouth wash swish and spit 4x per day

## 2018-11-19 ENCOUNTER — Inpatient Hospital Stay: Payer: PPO

## 2018-11-19 ENCOUNTER — Inpatient Hospital Stay: Payer: PPO | Attending: Obstetrics | Admitting: Hematology and Oncology

## 2018-11-19 ENCOUNTER — Other Ambulatory Visit: Payer: Self-pay | Admitting: *Deleted

## 2018-11-19 ENCOUNTER — Other Ambulatory Visit: Payer: Self-pay

## 2018-11-19 VITALS — BP 137/81

## 2018-11-19 DIAGNOSIS — C561 Malignant neoplasm of right ovary: Secondary | ICD-10-CM

## 2018-11-19 DIAGNOSIS — Z7189 Other specified counseling: Secondary | ICD-10-CM

## 2018-11-19 DIAGNOSIS — Z5112 Encounter for antineoplastic immunotherapy: Secondary | ICD-10-CM | POA: Diagnosis not present

## 2018-11-19 DIAGNOSIS — Z5111 Encounter for antineoplastic chemotherapy: Secondary | ICD-10-CM | POA: Diagnosis not present

## 2018-11-19 DIAGNOSIS — M79606 Pain in leg, unspecified: Secondary | ICD-10-CM

## 2018-11-19 DIAGNOSIS — K0889 Other specified disorders of teeth and supporting structures: Secondary | ICD-10-CM

## 2018-11-19 DIAGNOSIS — L271 Localized skin eruption due to drugs and medicaments taken internally: Secondary | ICD-10-CM

## 2018-11-19 DIAGNOSIS — C786 Secondary malignant neoplasm of retroperitoneum and peritoneum: Secondary | ICD-10-CM | POA: Diagnosis not present

## 2018-11-19 DIAGNOSIS — I10 Essential (primary) hypertension: Secondary | ICD-10-CM | POA: Diagnosis not present

## 2018-11-19 DIAGNOSIS — I480 Paroxysmal atrial fibrillation: Secondary | ICD-10-CM | POA: Diagnosis not present

## 2018-11-19 DIAGNOSIS — C569 Malignant neoplasm of unspecified ovary: Secondary | ICD-10-CM

## 2018-11-19 LAB — CMP (CANCER CENTER ONLY)
ALT: 12 U/L (ref 0–44)
AST: 15 U/L (ref 15–41)
Albumin: 3.6 g/dL (ref 3.5–5.0)
Alkaline Phosphatase: 73 U/L (ref 38–126)
Anion gap: 10 (ref 5–15)
BUN: 12 mg/dL (ref 8–23)
CO2: 24 mmol/L (ref 22–32)
Calcium: 9.4 mg/dL (ref 8.9–10.3)
Chloride: 96 mmol/L — ABNORMAL LOW (ref 98–111)
Creatinine: 0.8 mg/dL (ref 0.44–1.00)
GFR, Est AFR Am: >60 mL/min (ref >60)
GFR, Estimated: >60 mL/min (ref >60)
Glucose, Bld: 92 mg/dL (ref 70–99)
Potassium: 4.4 mmol/L (ref 3.5–5.1)
Sodium: 130 mmol/L — ABNORMAL LOW (ref 135–145)
Total Bilirubin: 0.4 mg/dL (ref 0.3–1.2)
Total Protein: 7 g/dL (ref 6.5–8.1)

## 2018-11-19 LAB — CBC WITH DIFFERENTIAL (CANCER CENTER ONLY)
Abs Immature Granulocytes: 0.05 10*3/uL (ref 0.00–0.07)
Basophils Absolute: 0.1 10*3/uL (ref 0.0–0.1)
Basophils Relative: 1 %
Eosinophils Absolute: 0.4 10*3/uL (ref 0.0–0.5)
Eosinophils Relative: 6 %
HCT: 36.6 % (ref 36.0–46.0)
Hemoglobin: 12.3 g/dL (ref 12.0–15.0)
Immature Granulocytes: 1 %
Lymphocytes Relative: 32 %
Lymphs Abs: 1.8 10*3/uL (ref 0.7–4.0)
MCH: 31 pg (ref 26.0–34.0)
MCHC: 33.6 g/dL (ref 30.0–36.0)
MCV: 92.2 fL (ref 80.0–100.0)
Monocytes Absolute: 0.9 10*3/uL (ref 0.1–1.0)
Monocytes Relative: 16 %
Neutro Abs: 2.4 10*3/uL (ref 1.7–7.7)
Neutrophils Relative %: 44 %
Platelet Count: 148 10*3/uL — ABNORMAL LOW (ref 150–400)
RBC: 3.97 MIL/uL (ref 3.87–5.11)
RDW: 14.4 % (ref 11.5–15.5)
WBC Count: 5.6 10*3/uL (ref 4.0–10.5)
nRBC: 0 % (ref 0.0–0.2)

## 2018-11-19 LAB — TOTAL PROTEIN, URINE DIPSTICK: Protein, ur: NEGATIVE mg/dL

## 2018-11-19 MED ORDER — HEPARIN SOD (PORK) LOCK FLUSH 100 UNIT/ML IV SOLN
500.0000 [IU] | Freq: Once | INTRAVENOUS | Status: AC | PRN
  Administered 2018-11-19: 500 [IU]
  Filled 2018-11-19: qty 5

## 2018-11-19 MED ORDER — DOXORUBICIN HCL LIPOSOMAL CHEMO INJECTION 2 MG/ML
24.0000 mg/m2 | Freq: Once | INTRAVENOUS | Status: AC
  Administered 2018-11-19: 40 mg via INTRAVENOUS
  Filled 2018-11-19: qty 20

## 2018-11-19 MED ORDER — DEXAMETHASONE SODIUM PHOSPHATE 10 MG/ML IJ SOLN
4.0000 mg | Freq: Once | INTRAMUSCULAR | Status: AC
  Administered 2018-11-19: 4 mg via INTRAVENOUS

## 2018-11-19 MED ORDER — SPIRONOLACTONE 25 MG PO TABS: 25.0000 mg | ORAL_TABLET | Freq: Every day | ORAL | 11 refills | Status: DC

## 2018-11-19 MED ORDER — SODIUM CHLORIDE 0.9% FLUSH
10.0000 mL | Freq: Once | INTRAVENOUS | Status: AC
  Administered 2018-11-19: 10 mL
  Filled 2018-11-19: qty 10

## 2018-11-19 MED ORDER — SODIUM CHLORIDE 0.9% FLUSH
10.0000 mL | INTRAVENOUS | Status: DC | PRN
  Administered 2018-11-19: 10 mL
  Filled 2018-11-19: qty 10

## 2018-11-19 MED ORDER — SODIUM CHLORIDE 0.9 % IV SOLN
10.0000 mg/kg | Freq: Once | INTRAVENOUS | Status: AC
  Administered 2018-11-19: 600 mg via INTRAVENOUS
  Filled 2018-11-19: qty 8

## 2018-11-19 MED ORDER — DEXAMETHASONE SODIUM PHOSPHATE 10 MG/ML IJ SOLN
INTRAMUSCULAR | Status: AC
  Filled 2018-11-19: qty 1

## 2018-11-19 MED ORDER — DEXTROSE 5 % IV SOLN
Freq: Once | INTRAVENOUS | Status: AC
  Administered 2018-11-19: 14:00:00 via INTRAVENOUS
  Filled 2018-11-19: qty 250

## 2018-11-19 MED ORDER — SODIUM CHLORIDE 0.9 % IV SOLN
Freq: Once | INTRAVENOUS | Status: AC
  Administered 2018-11-19: 12:00:00 via INTRAVENOUS
  Filled 2018-11-19: qty 250

## 2018-11-19 NOTE — Patient Instructions (Signed)

## 2018-11-19 NOTE — Patient Instructions (Signed)
[facility] Discharge Instructions for Patients Receiving Chemotherapy  Today you received the following chemotherapy agents [id number] and Doxil  To help prevent nausea and vomiting after your treatment, we encourage you to take your nausea medication as directed.    If you develop nausea and vomiting that is not controlled by your nausea medication, call the clinic.   BELOW ARE SYMPTOMS THAT SHOULD BE REPORTED IMMEDIATELY:  *FEVER GREATER THAN 100.5 F  *CHILLS WITH OR WITHOUT FEVER  NAUSEA AND VOMITING THAT IS NOT CONTROLLED WITH YOUR NAUSEA MEDICATION  *UNUSUAL SHORTNESS OF BREATH  *UNUSUAL BRUISING OR BLEEDING  TENDERNESS IN MOUTH AND THROAT WITH OR WITHOUT PRESENCE OF ULCERS  *URINARY PROBLEMS  *BOWEL PROBLEMS  UNUSUAL RASH Items with * indicate a potential emergency and should be followed up as soon as possible.  Feel free to call the clinic should you have any questions or concerns. The clinic phone number is [phone].  Please show the [place] at check-in to the Emergency Department and triage nurse.

## 2018-11-20 ENCOUNTER — Encounter: Payer: Self-pay | Admitting: Hematology and Oncology

## 2018-11-20 DIAGNOSIS — K0889 Other specified disorders of teeth and supporting structures: Secondary | ICD-10-CM | POA: Insufficient documentation

## 2018-11-20 NOTE — Assessment & Plan Note (Signed)
This is likely related to her hand-foot syndrome She will continue to take pain medicine as needed

## 2018-11-20 NOTE — Assessment & Plan Note (Signed)
Mucositis has resolved but she has some mild residual discomfort on her feet I plan to proceed with dose reduction for Doxil

## 2018-11-20 NOTE — Progress Notes (Signed)
[place] OFFICE PROGRESS NOTE  Patient Care Team: [place], Tristyn G, MD as PCP - General (Family Medicine) Troy Sine, MD as PCP - Cardiology (Cardiology) Awanda Mink Craige Cotta, RN as Oncology Nurse Navigator (Oncology) Lavera Guise, [facility] as [place] Management (Pharmacist) Adaline Sill, CPhT as [place] Management (Pharmacy Technician)  ASSESSMENT & PLAN:  Right ovarian epithelial cancer [facility]) The patient continues to have questions about rising tumor marker in the absence of symptoms Her recent CT imaging did not show gross disease The patient tolerated treatment poorly with hypertension, leg swelling and mild hand-foot syndrome We will proceed with bevacizumab and doxorubicin with reduced dose today  Pain, dental She has jaw pain from the site of recent dental extraction It is likely exacerbated by recent chemotherapy Examination by myself is benign but I encouraged her to make an appointment to see her dentist for further management/evaluation  Hand foot syndrome Mucositis has resolved but she has some mild residual discomfort on her feet I plan to proceed with dose reduction for Doxil  Essential hypertension, benign Her blood pressure control is satisfactory and she has lost some fluid weight with recent addition of spironolactone We will continue the same with goal weight around 136 pounds I will continue to monitor and adjust the medication  Lower extremity pain This is likely related to her hand-foot syndrome She will continue to take pain medicine as needed  PAF (paroxysmal atrial fibrillation) ([place]) She is on anticoagulation therapy with minimum self-limiting epistaxis We will continue to monitor closely while she is on anticoagulation therapy   No orders of the defined types were placed in this encounter.   INTERVAL HISTORY: Please see below for problem oriented charting. She returns for further  follow-up and chemotherapy She complains of chronic dental pain at the site of dental extraction She denies sore throat or difficulties with chewing or swallowing She continues to have pain and her leg and feet but it is improving Her blood pressure at home is well controlled and she has lost some fluid weight since introduction of spironolactone No recent infection, fever or chills She had brief episode of epistaxis recently that is self-limiting  SUMMARY OF ONCOLOGIC HISTORY: Oncology History Overview Note  Neg for genetics blood work and HRD   Right ovarian epithelial cancer ([place])  01/06/2018 Imaging   Ct abdomen and pelvis 1. Complex cystic mass at the right adnexa, measuring 5.9 x 4.0 cm, with nodular components, concerning for primary ovarian malignancy. 2. Diffuse nodularity along the omentum at the left side of the abdomen, extending into the mesentery at the left mid abdomen, concerning for peritoneal carcinomatosis. 3. Wall thickening at the distal ileum adjacent to the ovarian mass; bowel loops appear somewhat adherent to the ovarian mass. Bowel infiltration with tumor cannot be excluded. No evidence of bowel obstruction at this time. 4. Small volume ascites within the abdomen and pelvis.  Aortic Atherosclerosis (ICD10-I70.0).   01/08/2018 Tumor Marker   Patient's tumor was tested for the following markers: CA-125. Results of the tumor marker test revealed 3004   01/15/2018 Imaging   Chest CT:  1. No active cardiopulmonary disease. 2. Aortic atherosclerosis without aneurysm or dissection. 3. No large central pulmonary embolus.  CT AP:  1. Dilated fluid-filled loops of small bowel are redemonstrated slightly more extensive than on prior exam with transition point likely in the right adnexa adjacent to a complex cystic mass concerning for ovarian neoplasm given septations  and soft tissue nodularity. This raises concern for early or partial SBO. This soft tissue mass  measures 4.9 x 4 x 4.7 cm and has not changed since prior recent comparison. Additional short segmental area of luminal narrowing is noted in the right lower quadrant involving small bowel for which stigmata of peritoneal carcinomatosis or small-bowel metastatic implants might account for this. 2. Redemonstration of small volume of ascites predominantly in the upper abdomen surrounding the liver and spleen. 3. Redemonstration of thick bandlike omental thickening concerning for peritoneal carcinomatosis.    01/15/2018 Procedure   Successful ultrasound-guided diagnostic and therapeutic paracentesis yielding 1.5 liters of peritoneal fluid.    01/15/2018 Pathology Results   PERITONEAL/ASCITIC FLUID (SPECIMEN 1 OF 1 COLLECTED 01/18/18): MALIGNANT CELLS CONSISTENT WITH METASTATIC ADENOCARCINOMA. SEE COMMENT. COMMENT: THE MALIGNANT CELLS ARE POSITIVE FOR MOC-31, CYTOKERATIN 7, ESTROGEN RECEPTOR, PAX-8, AND WT-1. THEY ARE NEGATIVE FOR CALRETININ, CYTOKERATIN 5/6, AND CYTOKERATIN 20. THE PROFILE IS CONSISTENT WITH A PRIMARY GYNECOLOGIC CARCINOMA. THERE IS LIKELY SUFFICIENT TUMOR PRESENT, IF ADDITIONAL STUDIES ARE REQUESTED.   01/15/2018 - 02/02/2018 Hospital Admission   She was admitted to the hospital for SBO. She was treated with chemotherapy   01/18/2018 - 06/20/2018 Chemotherapy   The patient had carboplatin and taxol   02/08/2018 Cancer Staging   Staging form: Ovary, Fallopian Tube, and Primary Peritoneal Carcinoma, AJCC 8th Edition - Clinical: cT3, cN0, cM0 - Signed by Heath Lark, MD on 02/08/2018   02/08/2018 Tumor Marker   Patient's tumor was tested for the following markers: CA-125. Results of the tumor marker test revealed 445   03/02/2018 Tumor Marker   Patient's tumor was tested for the following markers: CA-125. Results of the tumor marker test revealed 174   03/15/2018 Imaging   6.2 cm complex cystic right ovarian mass, compatible with malignant ovarian neoplasm, mildly  progressive.  Associated peritoneal disease/omental caking beneath the anterior abdominal wall, mildly improved. Prior abdominal ascites is improved/resolved.  4.7 cm cystic left ovarian mass, without overt malignant features, grossly unchanged.  Mild bilateral hydroureteronephrosis, secondary to extrinsic compression, new.  Prior small bowel obstruction has improved/resolved.    03/27/2018 Pathology Results   1. Omentum, resection for tumor - OMENTUM: - HIGH GRADE SEROUS CARCINOMA. 2. Ovary, left - LEFT OVARY: - HIGH GRADE SEROUS CARCINOMA. - LEFT FALLOPIAN TUBE: - HIGH GRADE SEROUS CARCINOMA. 3. Adnexa - ovary +/- tube, neoplastic, right - RIGHT OVARY: - HIGH GRADE SEROUS CARCINOMA, 5.5 CM. - RIGHT FALLOPIAN TUBE: - HIGH GRADE SEROUS CARCINOMA. 4. Peritoneum, biopsy, nodule - PERITONEUM: - NODULES OF HIGH GRADE SEROUS CARCINOMA. 5. Peritoneum, resection for tumor, rectosigmoid - RECTOSIGMOID PERITONEUM: - HIGH GRADE SEROUS CARCINOMA. Microscopic Comment 3. OVARY or FALLOPIAN TUBE or PRIMARY PERITONEUM: Procedure: Bilateral salpingo-oophorectomy, omentectomy and peritoneal biopsies. Specimen Integrity: N/A. Tumor Site: Right ovary. Ovarian Surface Involvement (required only if applicable): Yes. Fallopian Tube Surface Involvement (required only if applicable): Yes. Tumor Size: 5.5 x 4.8 x 4.0 cm. Histologic Type: Serous carcinoma. Histologic Grade: High grade. Implants (required for advanced stage serous/seromucinous borderline tumors only): Omentum, left ovary and fallopian tube, rectosigmoid peritoneum and peritoneum. Other Tissue/ Organ Involvement: Left ovary and fallopian tube, omentum and rectosigmoid peritoneum. Largest Extrapelvic Peritoneal Focus (required only if applicable): 17.7 cm, omentum. Peritoneal/Ascitic Fluid: N/A. Treatment Effect (required only for high-grade serous carcinomas): Minimal. Regional Lymph Nodes: No lymph nodes submitted. Pathologic  Stage Classification (pTNM, AJCC 8th Edition): pT3c, pNX. Representative Tumor Block: 3A, 3B, 3D, 3E and 36F. Comment(s): There is a 5.5 cm  in greatest dimension partially cystic high grade serous carcinoma involving the right adnexal specimen and the tumor is staged as a primary right ovarian carcinoma. The carcinoma also involves the left ovary as well as the left fallopian tube, the omentum, peritoneum and the rectosigmoid peritoneal specimens.   03/27/2018 Surgery   Preoperative Diagnosis: ovarian cancer, stage IIIC, metastatic to omentum, peritoneum, serosa of intestines   Procedure(s) Performed: 1. Exploratory laparotomy with bilateral salpingo-oophorectomy, omentectomy radical tumor debulking for ovarian cancer .  Surgeon: Thereasa Solo, MD.   Specimens: Bilateral tubes / ovaries, omentum. Peritoneal nodules, rectosigmoid nodules.    Operative Findings: omental cake, miliary studding on diaphragm and bilateral paracolic gutters. Sigmoid colon densely adherent to left and right ovaries with tumor rind, ureters mildly dilated bilaterally due to retroperitoneal extension of the tumor towards ureters causing compression.    This represented an optimal cytoreduction (R1) with gross residual disease on the diaphragm that had been ablated and a thin tumor plaque on the sigmoid colon that was ablated. No residual disease >1cm remaining   05/07/2018 Genetic Testing   Negative genetic testing on the [employer] TumorNextHRD+ CancerNext Panel. The CancerNext gene panel offered by [employer] includes sequencing and rearrangement analysis for the following 34 genes:   APC, ATM, BARD1, BMPR1A, BRCA1, BRCA2, BRIP1, CDH1, CDK4, CDKN2A, CHEK2, DICER1, EPCAM, GREM1, HOXB13, MLH1, MRE11A, MSH2, MSH6, MUTYH, NBN, NF1, PALB2, PMS2, POLD1, POLE, PTEN, RAD50, RAD51C, RAD51D, SMAD4, SMARCA4, STK11, and TP53. Somatic genes analyzed through TumorNext-HRD: ATM, BARD1, BRCA1, BRCA2, BRIP1, CHEK2, MRE11A, NBN, PALB2,  RAD51C, RAD51D. The report date is 05/07/2018.   05/11/2018 Tumor Marker   Patient's tumor was tested for the following markers: CA-125. Results of the tumor marker test revealed 60.5   07/02/2018 Imaging   1. Mild right pericardiophrenic adenopathy is mildly increased, suspicious for metastatic nodes. No abdominopelvic adenopathy. 2. Small left peritoneal soft tissue nodule adjacent to the splenic flexure and smooth left pelvic sidewall peritoneal thickening, cannot exclude residual/recurrent disease. Attention on follow-up CT advised. 3. Bilateral renal collecting system dilatation has improved. 4.  Aortic Atherosclerosis (ICD10-I70.0).   07/02/2018 Tumor Marker   Patient's tumor was tested for the following markers: CA-125 Results of the tumor marker test revealed 42.7   07/11/2018 Echocardiogram   1. The left ventricle has normal systolic function with an ejection fraction of 60-65%. The cavity size was normal. Left ventricular diastolic parameters were normal.  2. Normal GLS -20.1.  3. The right ventricle has normal systolic function. The cavity was normal. There is no increase in right ventricular wall thickness.  4. The mitral valve is degenerative. Moderate thickening of the mitral valve leaflet. Moderate calcification of the mitral valve leaflet.  5. The aortic valve is tricuspid. Moderate thickening of the aortic valve. Moderate calcification of the aortic valve. Aortic valve regurgitation is trivial by color flow Doppler.  6. The aortic root is normal in size and structure.    Chemotherapy   The patient had doxorubicin and bevacizumab for chemotherapy treatment.     07/30/2018 Tumor Marker   Patient's tumor was tested for the following markers: CA-125 Results of the tumor marker test revealed 82.6   08/27/2018 Tumor Marker   Patient's tumor was tested for the following markers: CA-125 Results of the tumor marker test revealed 111.   09/24/2018 Tumor Marker   Patient's tumor was  tested for the following markers: CA-125 Results of the tumor marker test revealed 142.   10/05/2018 Imaging   1. Stable  right juxta diaphragmatic/pericardiophrenic lymph node, mildly enlarged. 2. Otherwise no definite metastatic disease identified in the abdomen/pelvis. 3. 3 mm peritoneal nodule noted left paracolic gutter on today's study, indeterminate. Attention on follow-up recommended. 4.  Aortic Atherosclerois (ICD10-170.0)   10/16/2018 Echocardiogram    1. No significant change from prior study (07/11/2018).  2. Left ventricular ejection fraction, by visual estimation, is 60 to 65%. The left ventricle has normal function. Normal left ventricular size. There is no left ventricular hypertrophy.  3. LVEF by 3D assessment 63%.  4. The average left ventricular global longitudinal strain is -22.4 %.  5. Global right ventricle has normal systolic function.The right ventricular size is normal. No increase in right ventricular wall thickness.  6. Left atrial size was normal.  7. Right atrial size was normal.  8. Presence of pericardial fat pad.  9. Moderate thickening of the mitral valve leaflet(s). 10. The mitral valve is normal in structure. Trace mitral valve regurgitation. 11. The tricuspid valve is normal in structure. Tricuspid valve regurgitation is mild. 12. Aortic regurgitation PHT measures 467 msec. 13. The aortic valve is tricuspid Aortic valve regurgitation is mild by color flow Doppler. Mild aortic valve sclerosis without stenosis. 14. There is mild to moderate calcification of the AoV, with focal calcification of the [place]. The aortic regurgitation is mild in severity, likely related to degenerative valve disease. 15. The pulmonic valve was grossly normal. Pulmonic valve regurgitation is not visualized by color flow Doppler. 16. Mild plaque invoving the ascending aorta. 17. Normal pulmonary artery systolic pressure. 18. The tricuspid regurgitant velocity is 2.52 m/s, and with an  assumed right atrial pressure of 3 mmHg, the estimated right ventricular systolic pressure is normal at 28.4 mmHg.   10/26/2018 Tumor Marker   Patient's tumor was tested for the following markers: CA-125 Results of the tumor marker test revealed 196     REVIEW OF SYSTEMS:   Constitutional: Denies fevers, chills or abnormal weight loss Eyes: Denies blurriness of vision Respiratory: Denies cough, dyspnea or wheezes Cardiovascular: Denies palpitation, chest discomfort  Gastrointestinal:  Denies nausea, heartburn or change in bowel habits Skin: Denies abnormal skin rashes Lymphatics: Denies new lymphadenopathy or easy bruising Behavioral/Psych: Mood is stable, no new changes  All other systems were reviewed with the patient and are negative.  I have reviewed the past medical history, past surgical history, social history and family history with the patient and they are unchanged from previous note.  ALLERGIES:  is allergic to tramadol hcl.  MEDICATIONS:  Current Outpatient Medications  Medication Sig Dispense Refill  . acetaminophen (TYLENOL) 500 MG tablet Take 2 tablets (1,000 mg total) by mouth every 6 (six) hours. (Patient taking differently: Take 1,000 mg by mouth every 6 (six) hours as needed. ) 30 tablet 0  . ALPRAZolam (XANAX) 0.5 MG tablet Take 0.5-1 tablets (0.25-0.5 mg total) by mouth daily as needed for anxiety. 30 tablet 2  . alum & mag hydroxide-simeth (MAALOX/MYLANTA) 200-200-20 MG/5ML suspension Take 30 mLs by mouth every 6 (six) hours as needed for indigestion or heartburn. 355 mL 0  . amLODipine (NORVASC) 10 MG tablet Take 1 tablet (10 mg total) by mouth daily. 90 tablet 3  . Calcium Carbonate (CALTRATE 600) 1500 MG TABS Take 600 mg of elemental calcium by mouth daily.     . cholecalciferol (VITAMIN D) 1000 UNITS tablet Take 1,000 Units by mouth daily.     . fluticasone (FLONASE) 50 MCG/ACT nasal spray Use 2 sprays in each nostril daily (Patient  taking differently: Place  2 sprays into both nostrils daily. ) 48 g 2  . hydrochlorothiazide (HYDRODIURIL) 25 MG tablet Take 1 tablet (25 mg total) by mouth daily. 90 tablet 1  . ibuprofen (ADVIL,MOTRIN) 200 MG tablet Take 600 mg by mouth every 6 (six) hours as needed for moderate pain.     Marland Kitchen lactose free nutrition (BOOST PLUS) LIQD Take 237 mLs by mouth daily. (Patient taking differently: Take 237 mLs by mouth 2 (two) times daily between meals. ) 10 Can 0  . levothyroxine (SYNTHROID) 75 MCG tablet Take 1 tablet (75 mcg total) by mouth daily. 90 tablet 3  . lidocaine-prilocaine (EMLA) cream Apply to affected area once 30 g 3  . magic mouthwash w/lidocaine SOLN Take 5 mLs by mouth 4 (four) times daily. 240 mL 0  . Magnesium 400 MG TABS Take 250 mg by mouth daily.    . Menthol-Methyl Salicylate (MUSCLE RUB) 10-15 % CREA Apply 1 application topically as needed for muscle pain.    . metoprolol succinate (TOPROL-XL) 50 MG 24 hr tablet Take 1 tablet (50 mg total) by mouth daily. Take with or immediately following a meal. 90 tablet 3  . Multiple Vitamins-Minerals (MULTIVITAMIN WITH MINERALS) tablet Take 1 tablet by mouth daily.      . Omega-3 Fatty Acids (FISH OIL) 1000 MG CAPS Take 2,000 mg by mouth 2 (two) times daily.     Marland Kitchen omeprazole (PRILOSEC) 40 MG capsule Take 1 capsule (40 mg total) by mouth daily. 90 capsule 3  . ondansetron (ZOFRAN) 8 MG tablet Take 1 tablet (8 mg total) by mouth every 8 (eight) hours as needed (Nausea or vomiting). 30 tablet 1  . oxyCODONE (OXY IR/ROXICODONE) 5 MG immediate release tablet Take 1 tablet (5 mg total) by mouth every 6 (six) hours as needed for severe pain. 30 tablet 0  . polyethylene glycol (MIRALAX / GLYCOLAX) packet Take 17 g by mouth daily. 14 each 0  . prochlorperazine (COMPAZINE) 10 MG tablet Take 1 tablet (10 mg total) by mouth every 6 (six) hours as needed (Nausea or vomiting). 30 tablet 1  . rivaroxaban (XARELTO) 20 MG TABS tablet Take 1 tablet (20 mg total) by mouth daily with  supper. 30 tablet 0  . senna (SENOKOT) 8.6 MG TABS tablet Take 1 tablet (8.6 mg total) by mouth at bedtime as needed for mild constipation. 120 each 0  . senna-docusate (SENOKOT-S) 8.6-50 MG tablet Take 2 tablets by mouth at bedtime. For AFTER surgery 30 tablet 0  . simvastatin (ZOCOR) 20 MG tablet Take 1 tablet (20 mg total) by mouth at bedtime. 90 tablet 3  . spironolactone (ALDACTONE) 25 MG tablet Take 1 tablet (25 mg total) by mouth daily. 90 tablet 11  . vitamin C (ASCORBIC ACID) 500 MG tablet Take 1,000 mg by mouth daily.     No current facility-administered medications for this visit.     PHYSICAL EXAMINATION: ECOG PERFORMANCE STATUS: 1 - Symptomatic but completely ambulatory  Vitals:   11/19/18 1130  BP: 140/63  Pulse: (!) 53  Resp: 18  Temp: 98.5 F (36.9 C)  SpO2: 100%   Filed Weights   11/19/18 1130  Weight: 140 lb 3.2 oz (63.6 kg)    GENERAL:alert, no distress and comfortable SKIN: skin color, texture, turgor are normal, no rashes or significant lesions EYES: normal, Conjunctiva are pink and non-injected, sclera clear OROPHARYNX:no exudate, no erythema and lips, buccal mucosa, and tongue normal  NECK: supple, thyroid normal size, non-tender, without  nodularity LYMPH:  no palpable lymphadenopathy in the cervical, axillary or inguinal LUNGS: clear to auscultation and percussion with normal breathing effort HEART: regular rate & rhythm and no murmurs and no lower extremity edema ABDOMEN:abdomen soft, non-tender and normal bowel sounds Musculoskeletal:no cyanosis of digits and no clubbing  NEURO: alert & oriented x 3 with fluent speech, no focal motor/sensory deficits  LABORATORY DATA:  I have reviewed the data as listed    Component Value Date/Time   NA 130 (L) 11/19/2018 1120   K 4.4 11/19/2018 1120   CL 96 (L) 11/19/2018 1120   CO2 24 11/19/2018 1120   GLUCOSE 92 11/19/2018 1120   BUN 12 11/19/2018 1120   CREATININE 0.80 11/19/2018 1120   CREATININE 0.76  11/17/2015 1005   CALCIUM 9.4 11/19/2018 1120   PROT 7.0 11/19/2018 1120   ALBUMIN 3.6 11/19/2018 1120   AST 15 11/19/2018 1120   ALT 12 11/19/2018 1120   ALKPHOS 73 11/19/2018 1120   BILITOT 0.4 11/19/2018 1120   GFRNONAA >60 11/19/2018 1120   GFRAA >60 11/19/2018 1120    No results found for: SPEP, UPEP  Lab Results  Component Value Date   WBC 5.6 11/19/2018   NEUTROABS 2.4 11/19/2018   HGB 12.3 11/19/2018   HCT 36.6 11/19/2018   MCV 92.2 11/19/2018   PLT 148 (L) 11/19/2018      Chemistry      Component Value Date/Time   NA 130 (L) 11/19/2018 1120   K 4.4 11/19/2018 1120   CL 96 (L) 11/19/2018 1120   CO2 24 11/19/2018 1120   BUN 12 11/19/2018 1120   CREATININE 0.80 11/19/2018 1120   CREATININE 0.76 11/17/2015 1005      Component Value Date/Time   CALCIUM 9.4 11/19/2018 1120   ALKPHOS 73 11/19/2018 1120   AST 15 11/19/2018 1120   ALT 12 11/19/2018 1120   BILITOT 0.4 11/19/2018 1120      All questions were answered. The patient knows to call the clinic with any problems, questions or concerns. No barriers to learning was detected.  I spent 25 minutes counseling the patient face to face. The total time spent in the appointment was 30 minutes and more than 50% was on counseling and review of test results  Heath Lark, MD 11/20/2018 8:33 AM

## 2018-11-20 NOTE — Assessment & Plan Note (Signed)
Her blood pressure control is satisfactory and she has lost some fluid weight with recent addition of spironolactone We will continue the same with goal weight around 136 pounds I will continue to monitor and adjust the medication

## 2018-11-20 NOTE — Assessment & Plan Note (Signed)
She is on anticoagulation therapy with minimum self-limiting epistaxis We will continue to monitor closely while she is on anticoagulation therapy

## 2018-11-20 NOTE — Assessment & Plan Note (Signed)
She has jaw pain from the site of recent dental extraction It is likely exacerbated by recent chemotherapy Examination by myself is benign but I encouraged her to make an appointment to see her dentist for further management/evaluation

## 2018-11-20 NOTE — Assessment & Plan Note (Signed)
The patient continues to have questions about rising tumor marker in the absence of symptoms Her recent CT imaging did not show gross disease The patient tolerated treatment poorly with hypertension, leg swelling and mild hand-foot syndrome We will proceed with bevacizumab and doxorubicin with reduced dose today

## 2018-11-21 ENCOUNTER — Other Ambulatory Visit: Payer: Self-pay | Admitting: Pharmacy Technician

## 2018-11-21 ENCOUNTER — Other Ambulatory Visit: Payer: Self-pay

## 2018-11-21 MED ORDER — SIMVASTATIN 20 MG PO TABS: 20.0000 mg | ORAL_TABLET | Freq: Every day | ORAL | 1 refills | Status: DC

## 2018-11-21 NOTE — Patient Outreach (Signed)
[facility]) Care Management  11/21/2018  Haley [name] 06-Aug-1945 [id number]    Follow up call placed to [employer] regarding patient assistance application(s) for Xarelto , [name] confirms patient has been denied due to no reaching Out iof pocket spend amount requirement. Patient will need to spend additional [phone] to meet requirement.  Follow up:  Will route note to [facility] for case closure  Maud Deed. Chana Bode [place] Certified Pharmacy Technician [place] Management Direct Dial:[phone]

## 2018-11-23 ENCOUNTER — Other Ambulatory Visit: Payer: Self-pay | Admitting: Family Medicine

## 2018-11-23 DIAGNOSIS — F419 Anxiety disorder, unspecified: Secondary | ICD-10-CM

## 2018-11-23 NOTE — Telephone Encounter (Signed)
Rx refill sent to PCP for approval

## 2018-11-23 NOTE — Telephone Encounter (Signed)
Requested medication (s) are due for refill today: yes  Requested medication (s) are on the active medication list: yes  Last refill:  09/12/2018  Future visit scheduled: no  Notes to clinic:  Refill cannot be delegated    Requested Prescriptions  Pending Prescriptions Disp Refills   ALPRAZolam (XANAX) 0.5 MG tablet 30 tablet 2    Sig: Take 0.5-1 tablets (0.25-0.5 mg total) by mouth daily as needed for anxiety.     Not Delegated - Psychiatry:  Anxiolytics/Hypnotics Failed - 11/23/2018  9:48 AM      Failed - This refill cannot be delegated      Failed - Urine Drug Screen completed in last 360 days.      Passed - Valid encounter within last 6 months    Recent Outpatient Visits          1 week ago Routine general medical examination at a health care facility   [facility] [place], Malka So, MD   1 month ago Hypertrophic toenail   [place] [facility] [place], Malka So, MD   2 months ago Rectal pain   [place] [facility] [place], Malka So, MD   8 months ago Rectal bleed   [facility] [place], Malka So, MD   9 months ago Gastroesophageal reflux disease, esophagitis presence not specified   [facility] at [facility] [place], Malka So, MD      Future Appointments            In 1 week Troy Sine, MD [facility], [facility]

## 2018-11-23 NOTE — Telephone Encounter (Signed)
Medication Refill - Medication: ALPRAZolam (XANAX) 0.5 MG tablet [id number]    Has the patient contacted their pharmacy? No. (Agent: If no, request that the patient contact the pharmacy for the refill.) (Agent: If yes, when and what did the pharmacy advise?)  Preferred Pharmacy (with phone number or street name):  [facility], [place] - [phone] (Phone)   Pt is completley out   Agent: Please be advised that RX refills may take up to 3 business days. We ask that you follow-up with your pharmacy.

## 2018-11-26 ENCOUNTER — Other Ambulatory Visit: Payer: Self-pay | Admitting: *Deleted

## 2018-11-26 ENCOUNTER — Encounter: Payer: Self-pay | Admitting: Family Medicine

## 2018-11-26 NOTE — Telephone Encounter (Signed)
Last filled 09/13/2018 Last OV 11/13/2018

## 2018-11-26 NOTE — Telephone Encounter (Signed)
It seems like she has 2 refills left at her pharmacy from last Rx in 09/2018. Please verify. Thanks, BJ

## 2018-11-27 NOTE — Telephone Encounter (Signed)
Please call her pharmacy , she is supposed to have 2 more refills left. Last Rx sent 09/13/18 x 2 refills. According to the [place] controlled medications she has not filled Rx since 09/25/18.  Thanks, BJ

## 2018-11-28 ENCOUNTER — Telehealth: Payer: Self-pay

## 2018-11-28 DIAGNOSIS — F419 Anxiety disorder, unspecified: Secondary | ICD-10-CM

## 2018-11-28 NOTE — Telephone Encounter (Signed)
Spoke with patient. Her mail order pharmacy is unable to fill prescriptions for 30 days.  Xanax need to be called in to [facility] in [place]

## 2018-11-28 NOTE — Telephone Encounter (Signed)
I just received this request again. She has 2 refills left at her pharmacy. Thanks, BJ

## 2018-11-28 NOTE — Telephone Encounter (Signed)
Copied from [place] [id number]. Topic: General - Other >> Nov 28, 2018  1:11 PM Rainey Pines A wrote: Patient is requesting a callback today in regards to her medication. The pharmacy is stating that the patient no longer has any refills. Please advise

## 2018-11-28 NOTE — Telephone Encounter (Signed)
Last filled 09/13/2018 Last OV 11/13/2018

## 2018-11-28 NOTE — Telephone Encounter (Signed)
Please call her pharmacy , she is supposed to have 2 more refills left. Last Rx sent 09/13/18 x 2 refills. According to the [place] controlled medications she has not filled Rx since 09/25/18.  Thanks, BJ

## 2018-11-30 ENCOUNTER — Other Ambulatory Visit: Payer: Self-pay

## 2018-11-30 ENCOUNTER — Telehealth: Payer: Self-pay | Admitting: Oncology

## 2018-11-30 MED ORDER — MAGIC MOUTHWASH W/LIDOCAINE: 5.0000 mL | Freq: Four times a day (QID) | ORAL | 0 refills | Status: DC

## 2018-11-30 NOTE — Telephone Encounter (Signed)
Spoke with patient, she is aware that a message has been sent to Dr. [place]

## 2018-11-30 NOTE — Telephone Encounter (Signed)
Pt called stating she is needing her alprazolam sent to [facility]. Please advise.    [place], [place] - [id number]  [address] [place] [address]  Phone: [phone] Fax: [phone]  Not a 24 hour pharmacy; exact hours not known.

## 2018-11-30 NOTE — Telephone Encounter (Signed)
Haley [name] with message from Dr. Alvy Bimler.  She said she will pick up the prescription latter today and will bring her husband to Dr. Calton Dach appointment on Monday.

## 2018-11-30 NOTE — Telephone Encounter (Signed)
Haley [name] called and said that her tongue is sore, swollen and red.  Her husband looked in her mouth and said she has an ulcer on the left side of her tongue and underneath. The left side of her mouth is also sore where she had her tooth removed and her jaw is swollen on that side.  She has been using a salt water rinse which is helping.  She ran out of magic mouthwash and would like a refill sent to [facility] in [place].  She is able to eat soft foods but said it takes her awhile.   She also mentioned that her shoulders are tense and she has a stiff neck where she can't turn her head all the way to the side.  She is taking ibuprofen which is helping.

## 2018-11-30 NOTE — Telephone Encounter (Signed)
Agree with Magic mouth wash, please call for refill, Hassan Rowan I will see her Monday as scheduled I would like her husband to come along if possible

## 2018-12-03 ENCOUNTER — Inpatient Hospital Stay: Payer: PPO

## 2018-12-03 ENCOUNTER — Other Ambulatory Visit: Payer: Self-pay

## 2018-12-03 ENCOUNTER — Inpatient Hospital Stay (HOSPITAL_BASED_OUTPATIENT_CLINIC_OR_DEPARTMENT_OTHER): Payer: PPO | Admitting: Hematology and Oncology

## 2018-12-03 ENCOUNTER — Encounter: Payer: Self-pay | Admitting: Hematology and Oncology

## 2018-12-03 VITALS — BP 142/71 | HR 54 | Temp 98.3°F | Resp 18 | Ht 62.0 in | Wt 141.4 lb

## 2018-12-03 DIAGNOSIS — C561 Malignant neoplasm of right ovary: Secondary | ICD-10-CM | POA: Diagnosis not present

## 2018-12-03 DIAGNOSIS — M79606 Pain in leg, unspecified: Secondary | ICD-10-CM

## 2018-12-03 DIAGNOSIS — I10 Essential (primary) hypertension: Secondary | ICD-10-CM | POA: Diagnosis not present

## 2018-12-03 DIAGNOSIS — D61818 Other pancytopenia: Secondary | ICD-10-CM

## 2018-12-03 DIAGNOSIS — K1231 Oral mucositis (ulcerative) due to antineoplastic therapy: Secondary | ICD-10-CM | POA: Diagnosis not present

## 2018-12-03 DIAGNOSIS — C569 Malignant neoplasm of unspecified ovary: Secondary | ICD-10-CM

## 2018-12-03 DIAGNOSIS — Z5111 Encounter for antineoplastic chemotherapy: Secondary | ICD-10-CM | POA: Diagnosis not present

## 2018-12-03 DIAGNOSIS — R1084 Generalized abdominal pain: Secondary | ICD-10-CM

## 2018-12-03 DIAGNOSIS — Z7189 Other specified counseling: Secondary | ICD-10-CM

## 2018-12-03 LAB — CBC WITH DIFFERENTIAL (CANCER CENTER ONLY)
Abs Immature Granulocytes: 0.03 10*3/uL (ref 0.00–0.07)
Basophils Absolute: 0 10*3/uL (ref 0.0–0.1)
Basophils Relative: 1 %
Eosinophils Absolute: 0.6 10*3/uL — ABNORMAL HIGH (ref 0.0–0.5)
Eosinophils Relative: 10 %
HCT: 35 % — ABNORMAL LOW (ref 36.0–46.0)
Hemoglobin: 11.9 g/dL — ABNORMAL LOW (ref 12.0–15.0)
Immature Granulocytes: 1 %
Lymphocytes Relative: 23 %
Lymphs Abs: 1.4 10*3/uL (ref 0.7–4.0)
MCH: 30.8 pg (ref 26.0–34.0)
MCHC: 34 g/dL (ref 30.0–36.0)
MCV: 90.7 fL (ref 80.0–100.0)
Monocytes Absolute: 0.6 10*3/uL (ref 0.1–1.0)
Monocytes Relative: 9 %
Neutro Abs: 3.4 10*3/uL (ref 1.7–7.7)
Neutrophils Relative %: 56 %
Platelet Count: 141 10*3/uL — ABNORMAL LOW (ref 150–400)
RBC: 3.86 MIL/uL — ABNORMAL LOW (ref 3.87–5.11)
RDW: 14.5 % (ref 11.5–15.5)
WBC Count: 6.1 10*3/uL (ref 4.0–10.5)
nRBC: 0 % (ref 0.0–0.2)

## 2018-12-03 LAB — CMP (CANCER CENTER ONLY)
ALT: 11 U/L (ref 0–44)
AST: 15 U/L (ref 15–41)
Albumin: 3.4 g/dL — ABNORMAL LOW (ref 3.5–5.0)
Alkaline Phosphatase: 69 U/L (ref 38–126)
Anion gap: 9 (ref 5–15)
BUN: 19 mg/dL (ref 8–23)
CO2: 25 mmol/L (ref 22–32)
Calcium: 9.2 mg/dL (ref 8.9–10.3)
Chloride: 94 mmol/L — ABNORMAL LOW (ref 98–111)
Creatinine: 0.81 mg/dL (ref 0.44–1.00)
GFR, Est AFR Am: >60 mL/min (ref >60)
GFR, Estimated: >60 mL/min (ref >60)
Glucose, Bld: 93 mg/dL (ref 70–99)
Potassium: 4 mmol/L (ref 3.5–5.1)
Sodium: 128 mmol/L — ABNORMAL LOW (ref 135–145)
Total Bilirubin: 0.6 mg/dL (ref 0.3–1.2)
Total Protein: 6.7 g/dL (ref 6.5–8.1)

## 2018-12-03 MED ORDER — SODIUM CHLORIDE 0.9 % IV SOLN
10.0000 mg/kg | Freq: Once | INTRAVENOUS | Status: AC
  Administered 2018-12-03: 600 mg via INTRAVENOUS
  Filled 2018-12-03: qty 16

## 2018-12-03 MED ORDER — HEPARIN SOD (PORK) LOCK FLUSH 100 UNIT/ML IV SOLN
500.0000 [IU] | Freq: Once | INTRAVENOUS | Status: AC | PRN
  Administered 2018-12-03: 500 [IU]
  Filled 2018-12-03: qty 5

## 2018-12-03 MED ORDER — HYDROMORPHONE HCL 2 MG PO TABS: 2.0000 mg | ORAL_TABLET | ORAL | 0 refills | Status: DC | PRN

## 2018-12-03 MED ORDER — SODIUM CHLORIDE 0.9% FLUSH
10.0000 mL | INTRAVENOUS | Status: DC | PRN
  Administered 2018-12-03: 10 mL
  Filled 2018-12-03: qty 10

## 2018-12-03 MED ORDER — SODIUM CHLORIDE 0.9 % IV SOLN
Freq: Once | INTRAVENOUS | Status: AC
  Administered 2018-12-03: 12:00:00 via INTRAVENOUS
  Filled 2018-12-03: qty 250

## 2018-12-03 MED ORDER — SODIUM CHLORIDE 0.9% FLUSH
10.0000 mL | Freq: Once | INTRAVENOUS | Status: AC
  Administered 2018-12-03: 10 mL
  Filled 2018-12-03: qty 10

## 2018-12-03 NOTE — Assessment & Plan Note (Signed)
Overall, she continues to tolerate treatment very poorly despite multiple dose adjustment I am concerned of her rising tumor marker After bevacizumab treatment today, I plan to order CT imaging next week and see her back to discuss test results

## 2018-12-03 NOTE — Progress Notes (Signed)
[facility] OFFICE PROGRESS NOTE  Patient Care Team: Martinique, Demmi G, MD as PCP - General (Family Medicine) Troy Sine, MD as PCP - Cardiology (Cardiology) Awanda Mink Craige Cotta, RN as Oncology Nurse Navigator (Oncology) Lavera Guise, [facility] as [place] Management (Pharmacist)  ASSESSMENT & PLAN:  Right ovarian epithelial cancer ([place]) Overall, she continues to tolerate treatment very poorly despite multiple dose adjustment I am concerned of her rising tumor marker After bevacizumab treatment today, I plan to order CT imaging next week and see her back to discuss test results   Mucositis due to antineoplastic therapy She is noted to have ulcerated mucositis Continue aggressive supportive care As above, plan to order CT imaging to make sure that she is responding to treatment She tolerated oxycodone poorly I switch her pain management to Dilaudid to take as needed I will assess pain control next week  Essential hypertension, benign Her blood pressure control is satisfactory Due to hyponatremia, I recommend her to hold hydrochlorothiazide and Aldactone  Pancytopenia, acquired [facility]) She has mild pancytopenia due to treatment We will monitor closely   Orders Placed This Encounter  Procedures  . CT Abdomen Pelvis W Contrast    Standing Status:   Future    Standing Expiration Date:   12/03/2019    Order Specific Question:   If indicated for the ordered procedure, I authorize the administration of contrast media per Radiology protocol    Answer:   Yes    Order Specific Question:   Preferred imaging location?    Answer:   [facility]    Order Specific Question:   Radiology Contrast Protocol - do NOT remove file path    Answer:   [email or web address]    INTERVAL HISTORY: Please see below for problem oriented charting. She returns for chemotherapy and follow-up She developed ulcerative mucositis last week Overall,  it is improving She denies mouth bleeding She requests a different pain management because oxycodone causes nausea Denies hand-foot syndrome No recent fever or chills SUMMARY OF ONCOLOGIC HISTORY: Oncology History Overview Note  Neg for genetics blood work and HRD   Right ovarian epithelial cancer ([place])  01/06/2018 Imaging   Ct abdomen and pelvis 1. Complex cystic mass at the right adnexa, measuring 5.9 x 4.0 cm, with nodular components, concerning for primary ovarian malignancy. 2. Diffuse nodularity along the omentum at the left side of the abdomen, extending into the mesentery at the left mid abdomen, concerning for peritoneal carcinomatosis. 3. Wall thickening at the distal ileum adjacent to the ovarian mass; bowel loops appear somewhat adherent to the ovarian mass. Bowel infiltration with tumor cannot be excluded. No evidence of bowel obstruction at this time. 4. Small volume ascites within the abdomen and pelvis.  Aortic Atherosclerosis (ICD10-I70.0).   01/08/2018 Tumor Marker   Patient's tumor was tested for the following markers: CA-125. Results of the tumor marker test revealed 3004   01/15/2018 Imaging   Chest CT:  1. No active cardiopulmonary disease. 2. Aortic atherosclerosis without aneurysm or dissection. 3. No large central pulmonary embolus.  CT AP:  1. Dilated fluid-filled loops of small bowel are redemonstrated slightly more extensive than on prior exam with transition point likely in the right adnexa adjacent to a complex cystic mass concerning for ovarian neoplasm given septations and soft tissue nodularity. This raises concern for early or partial SBO. This soft tissue mass measures 4.9 x 4 x 4.7 cm and has not changed since prior recent comparison.  Additional short segmental area of luminal narrowing is noted in the right lower quadrant involving small bowel for which stigmata of peritoneal carcinomatosis or small-bowel metastatic implants might account for  this. 2. Redemonstration of small volume of ascites predominantly in the upper abdomen surrounding the liver and spleen. 3. Redemonstration of thick bandlike omental thickening concerning for peritoneal carcinomatosis.    01/15/2018 Procedure   Successful ultrasound-guided diagnostic and therapeutic paracentesis yielding 1.5 liters of peritoneal fluid.    01/15/2018 Pathology Results   PERITONEAL/ASCITIC FLUID (SPECIMEN 1 OF 1 COLLECTED 01/18/18): MALIGNANT CELLS CONSISTENT WITH METASTATIC ADENOCARCINOMA. SEE COMMENT. COMMENT: THE MALIGNANT CELLS ARE POSITIVE FOR MOC-31, CYTOKERATIN 7, ESTROGEN RECEPTOR, PAX-8, AND WT-1. THEY ARE NEGATIVE FOR CALRETININ, CYTOKERATIN 5/6, AND CYTOKERATIN 20. THE PROFILE IS CONSISTENT WITH A PRIMARY GYNECOLOGIC CARCINOMA. THERE IS LIKELY SUFFICIENT TUMOR PRESENT, IF ADDITIONAL STUDIES ARE REQUESTED.   01/15/2018 - 02/02/2018 Hospital Admission   She was admitted to the hospital for SBO. She was treated with chemotherapy   01/18/2018 - 06/20/2018 Chemotherapy   The patient had carboplatin and taxol   02/08/2018 Cancer Staging   Staging form: Ovary, Fallopian Tube, and Primary Peritoneal Carcinoma, AJCC 8th Edition - Clinical: cT3, cN0, cM0 - Signed by Heath Lark, MD on 02/08/2018   02/08/2018 Tumor Marker   Patient's tumor was tested for the following markers: CA-125. Results of the tumor marker test revealed 445   03/02/2018 Tumor Marker   Patient's tumor was tested for the following markers: CA-125. Results of the tumor marker test revealed 174   03/15/2018 Imaging   6.2 cm complex cystic right ovarian mass, compatible with malignant ovarian neoplasm, mildly progressive.  Associated peritoneal disease/omental caking beneath the anterior abdominal wall, mildly improved. Prior abdominal ascites is improved/resolved.  4.7 cm cystic left ovarian mass, without overt malignant features, grossly unchanged.  Mild bilateral hydroureteronephrosis, secondary to  extrinsic compression, new.  Prior small bowel obstruction has improved/resolved.    03/27/2018 Pathology Results   1. Omentum, resection for tumor - OMENTUM: - HIGH GRADE SEROUS CARCINOMA. 2. Ovary, left - LEFT OVARY: - HIGH GRADE SEROUS CARCINOMA. - LEFT FALLOPIAN TUBE: - HIGH GRADE SEROUS CARCINOMA. 3. Adnexa - ovary +/- tube, neoplastic, right - RIGHT OVARY: - HIGH GRADE SEROUS CARCINOMA, 5.5 CM. - RIGHT FALLOPIAN TUBE: - HIGH GRADE SEROUS CARCINOMA. 4. Peritoneum, biopsy, nodule - PERITONEUM: - NODULES OF HIGH GRADE SEROUS CARCINOMA. 5. Peritoneum, resection for tumor, rectosigmoid - RECTOSIGMOID PERITONEUM: - HIGH GRADE SEROUS CARCINOMA. Microscopic Comment 3. OVARY or FALLOPIAN TUBE or PRIMARY PERITONEUM: Procedure: Bilateral salpingo-oophorectomy, omentectomy and peritoneal biopsies. Specimen Integrity: N/A. Tumor Site: Right ovary. Ovarian Surface Involvement (required only if applicable): Yes. Fallopian Tube Surface Involvement (required only if applicable): Yes. Tumor Size: 5.5 x 4.8 x 4.0 cm. Histologic Type: Serous carcinoma. Histologic Grade: High grade. Implants (required for advanced stage serous/seromucinous borderline tumors only): Omentum, left ovary and fallopian tube, rectosigmoid peritoneum and peritoneum. Other Tissue/ Organ Involvement: Left ovary and fallopian tube, omentum and rectosigmoid peritoneum. Largest Extrapelvic Peritoneal Focus (required only if applicable): 86.5 cm, omentum. Peritoneal/Ascitic Fluid: N/A. Treatment Effect (required only for high-grade serous carcinomas): Minimal. Regional Lymph Nodes: No lymph nodes submitted. Pathologic Stage Classification (pTNM, AJCC 8th Edition): pT3c, pNX. Representative Tumor Block: 3A, 3B, 3D, 3E and 23F. Comment(s): There is a 5.5 cm in greatest dimension partially cystic high grade serous carcinoma involving the right adnexal specimen and the tumor is staged as a primary right ovarian carcinoma.  The carcinoma also involves the left  ovary as well as the left fallopian tube, the omentum, peritoneum and the rectosigmoid peritoneal specimens.   03/27/2018 Surgery   Preoperative Diagnosis: ovarian cancer, stage IIIC, metastatic to omentum, peritoneum, serosa of intestines   Procedure(s) Performed: 1. Exploratory laparotomy with bilateral salpingo-oophorectomy, omentectomy radical tumor debulking for ovarian cancer .  Surgeon: Thereasa Solo, MD.   Specimens: Bilateral tubes / ovaries, omentum. Peritoneal nodules, rectosigmoid nodules.    Operative Findings: omental cake, miliary studding on diaphragm and bilateral paracolic gutters. Sigmoid colon densely adherent to left and right ovaries with tumor rind, ureters mildly dilated bilaterally due to retroperitoneal extension of the tumor towards ureters causing compression.    This represented an optimal cytoreduction (R1) with gross residual disease on the diaphragm that had been ablated and a thin tumor plaque on the sigmoid colon that was ablated. No residual disease >1cm remaining   05/07/2018 Genetic Testing   Negative genetic testing on the [employer] TumorNextHRD+ CancerNext Panel. The CancerNext gene panel offered by [employer] includes sequencing and rearrangement analysis for the following 34 genes:   APC, ATM, BARD1, BMPR1A, BRCA1, BRCA2, BRIP1, CDH1, CDK4, CDKN2A, CHEK2, DICER1, EPCAM, GREM1, HOXB13, MLH1, MRE11A, MSH2, MSH6, MUTYH, NBN, NF1, PALB2, PMS2, POLD1, POLE, PTEN, RAD50, RAD51C, RAD51D, SMAD4, SMARCA4, STK11, and TP53. Somatic genes analyzed through TumorNext-HRD: ATM, BARD1, BRCA1, BRCA2, BRIP1, CHEK2, MRE11A, NBN, PALB2, RAD51C, RAD51D. The report date is 05/07/2018.   05/11/2018 Tumor Marker   Patient's tumor was tested for the following markers: CA-125. Results of the tumor marker test revealed 60.5   07/02/2018 Imaging   1. Mild right pericardiophrenic adenopathy is mildly increased, suspicious for metastatic nodes. No  abdominopelvic adenopathy. 2. Small left peritoneal soft tissue nodule adjacent to the splenic flexure and smooth left pelvic sidewall peritoneal thickening, cannot exclude residual/recurrent disease. Attention on follow-up CT advised. 3. Bilateral renal collecting system dilatation has improved. 4.  Aortic Atherosclerosis (ICD10-I70.0).   07/02/2018 Tumor Marker   Patient's tumor was tested for the following markers: CA-125 Results of the tumor marker test revealed 42.7   07/11/2018 Echocardiogram   1. The left ventricle has normal systolic function with an ejection fraction of 60-65%. The cavity size was normal. Left ventricular diastolic parameters were normal.  2. Normal GLS -20.1.  3. The right ventricle has normal systolic function. The cavity was normal. There is no increase in right ventricular wall thickness.  4. The mitral valve is degenerative. Moderate thickening of the mitral valve leaflet. Moderate calcification of the mitral valve leaflet.  5. The aortic valve is tricuspid. Moderate thickening of the aortic valve. Moderate calcification of the aortic valve. Aortic valve regurgitation is trivial by color flow Doppler.  6. The aortic root is normal in size and structure.    Chemotherapy   The patient had doxorubicin and bevacizumab for chemotherapy treatment.     07/30/2018 Tumor Marker   Patient's tumor was tested for the following markers: CA-125 Results of the tumor marker test revealed 82.6   08/27/2018 Tumor Marker   Patient's tumor was tested for the following markers: CA-125 Results of the tumor marker test revealed 111.   09/24/2018 Tumor Marker   Patient's tumor was tested for the following markers: CA-125 Results of the tumor marker test revealed 142.   10/05/2018 Imaging   1. Stable right juxta diaphragmatic/pericardiophrenic lymph node, mildly enlarged. 2. Otherwise no definite metastatic disease identified in the abdomen/pelvis. 3. 3 mm peritoneal nodule noted  left paracolic gutter on today's study, indeterminate. Attention  on follow-up recommended. 4.  Aortic Atherosclerois (ICD10-170.0)   10/16/2018 Echocardiogram    1. No significant change from prior study (07/11/2018).  2. Left ventricular ejection fraction, by visual estimation, is 60 to 65%. The left ventricle has normal function. Normal left ventricular size. There is no left ventricular hypertrophy.  3. LVEF by 3D assessment 63%.  4. The average left ventricular global longitudinal strain is -22.4 %.  5. Global right ventricle has normal systolic function.The right ventricular size is normal. No increase in right ventricular wall thickness.  6. Left atrial size was normal.  7. Right atrial size was normal.  8. Presence of pericardial fat pad.  9. Moderate thickening of the mitral valve leaflet(s). 10. The mitral valve is normal in structure. Trace mitral valve regurgitation. 11. The tricuspid valve is normal in structure. Tricuspid valve regurgitation is mild. 12. Aortic regurgitation PHT measures 467 msec. 13. The aortic valve is tricuspid Aortic valve regurgitation is mild by color flow Doppler. Mild aortic valve sclerosis without stenosis. 14. There is mild to moderate calcification of the AoV, with focal calcification of the [place]. The aortic regurgitation is mild in severity, likely related to degenerative valve disease. 15. The pulmonic valve was grossly normal. Pulmonic valve regurgitation is not visualized by color flow Doppler. 16. Mild plaque invoving the ascending aorta. 17. Normal pulmonary artery systolic pressure. 18. The tricuspid regurgitant velocity is 2.52 m/s, and with an assumed right atrial pressure of 3 mmHg, the estimated right ventricular systolic pressure is normal at 28.4 mmHg.   10/26/2018 Tumor Marker   Patient's tumor was tested for the following markers: CA-125 Results of the tumor marker test revealed 196     REVIEW OF SYSTEMS:   Constitutional: Denies  fevers, chills or abnormal weight loss Eyes: Denies blurriness of vision Respiratory: Denies cough, dyspnea or wheezes Cardiovascular: Denies palpitation, chest discomfort or lower extremity swelling Gastrointestinal:  Denies nausea, heartburn or change in bowel habits Skin: Denies abnormal skin rashes Lymphatics: Denies new lymphadenopathy or easy bruising Neurological:Denies numbness, tingling or new weaknesses Behavioral/Psych: Mood is stable, no new changes  All other systems were reviewed with the patient and are negative.  I have reviewed the past medical history, past surgical history, social history and family history with the patient and they are unchanged from previous note.  ALLERGIES:  is allergic to tramadol hcl.  MEDICATIONS:  Current Outpatient Medications  Medication Sig Dispense Refill  . acetaminophen (TYLENOL) 500 MG tablet Take 2 tablets (1,000 mg total) by mouth every 6 (six) hours. (Patient taking differently: Take 1,000 mg by mouth every 6 (six) hours as needed. ) 30 tablet 0  . ALPRAZolam (XANAX) 0.5 MG tablet Take 0.5-1 tablets (0.25-0.5 mg total) by mouth daily as needed for anxiety. 30 tablet 2  . alum & mag hydroxide-simeth (MAALOX/MYLANTA) 200-200-20 MG/5ML suspension Take 30 mLs by mouth every 6 (six) hours as needed for indigestion or heartburn. 355 mL 0  . amLODipine (NORVASC) 10 MG tablet Take 1 tablet (10 mg total) by mouth daily. 90 tablet 3  . Calcium Carbonate (CALTRATE 600) 1500 MG TABS Take 600 mg of elemental calcium by mouth daily.     . cholecalciferol (VITAMIN D) 1000 UNITS tablet Take 1,000 Units by mouth daily.     . fluticasone (FLONASE) 50 MCG/ACT nasal spray Use 2 sprays in each nostril daily (Patient taking differently: Place 2 sprays into both nostrils daily. ) 48 g 2  . hydrochlorothiazide (HYDRODIURIL) 25 MG tablet Take 1  tablet (25 mg total) by mouth daily. 90 tablet 1  . HYDROmorphone (DILAUDID) 2 MG tablet Take 1 tablet (2 mg total) by  mouth every 4 (four) hours as needed for severe pain. 30 tablet 0  . ibuprofen (ADVIL,MOTRIN) 200 MG tablet Take 600 mg by mouth every 6 (six) hours as needed for moderate pain.     Marland Kitchen lactose free nutrition (BOOST PLUS) LIQD Take 237 mLs by mouth daily. (Patient taking differently: Take 237 mLs by mouth 2 (two) times daily between meals. ) 10 Can 0  . levothyroxine (SYNTHROID) 75 MCG tablet Take 1 tablet (75 mcg total) by mouth daily. 90 tablet 3  . lidocaine-prilocaine (EMLA) cream Apply to affected area once 30 g 3  . magic mouthwash w/lidocaine SOLN Take 5 mLs by mouth 4 (four) times daily. 240 mL 0  . Magnesium 400 MG TABS Take 250 mg by mouth daily.    . Menthol-Methyl Salicylate (MUSCLE RUB) 10-15 % CREA Apply 1 application topically as needed for muscle pain.    . metoprolol succinate (TOPROL-XL) 50 MG 24 hr tablet Take 1 tablet (50 mg total) by mouth daily. Take with or immediately following a meal. 90 tablet 3  . Multiple Vitamins-Minerals (MULTIVITAMIN WITH MINERALS) tablet Take 1 tablet by mouth daily.      . Omega-3 Fatty Acids (FISH OIL) 1000 MG CAPS Take 2,000 mg by mouth 2 (two) times daily.     Marland Kitchen omeprazole (PRILOSEC) 40 MG capsule Take 1 capsule (40 mg total) by mouth daily. 90 capsule 3  . ondansetron (ZOFRAN) 8 MG tablet Take 1 tablet (8 mg total) by mouth every 8 (eight) hours as needed (Nausea or vomiting). 30 tablet 1  . polyethylene glycol (MIRALAX / GLYCOLAX) packet Take 17 g by mouth daily. 14 each 0  . prochlorperazine (COMPAZINE) 10 MG tablet Take 1 tablet (10 mg total) by mouth every 6 (six) hours as needed (Nausea or vomiting). 30 tablet 1  . rivaroxaban (XARELTO) 20 MG TABS tablet Take 1 tablet (20 mg total) by mouth daily with supper. 30 tablet 0  . senna (SENOKOT) 8.6 MG TABS tablet Take 1 tablet (8.6 mg total) by mouth at bedtime as needed for mild constipation. 120 each 0  . senna-docusate (SENOKOT-S) 8.6-50 MG tablet Take 2 tablets by mouth at bedtime. For AFTER  surgery 30 tablet 0  . simvastatin (ZOCOR) 20 MG tablet Take 1 tablet (20 mg total) by mouth at bedtime. 90 tablet 1  . spironolactone (ALDACTONE) 25 MG tablet Take 1 tablet (25 mg total) by mouth daily. 90 tablet 11  . vitamin C (ASCORBIC ACID) 500 MG tablet Take 1,000 mg by mouth daily.     No current facility-administered medications for this visit.    Facility-Administered Medications Ordered in Other Visits  Medication Dose Route Frequency Provider Last Rate Last Dose  . sodium chloride flush (NS) 0.9 % injection 10 mL  10 mL Intracatheter PRN Alvy Bimler, Haadiya Frogge, MD   10 mL at 12/03/18 1304    PHYSICAL EXAMINATION: ECOG PERFORMANCE STATUS: 1 - Symptomatic but completely ambulatory  Vitals:   12/03/18 1139  BP: (!) 142/71  Pulse: (!) 54  Resp: 18  Temp: 98.3 F (36.8 C)  SpO2: 100%   Filed Weights   12/03/18 1139  Weight: 141 lb 6.4 oz (64.1 kg)    GENERAL:alert, no distress and comfortable SKIN: skin color, texture, turgor are normal, no rashes or significant lesions EYES: normal, Conjunctiva are pink and non-injected, sclera  clear OROPHARYNX noted grade 2 mucositis.  No bleeding NECK: supple, thyroid normal size, non-tender, without nodularity LYMPH:  no palpable lymphadenopathy in the cervical, axillary or inguinal LUNGS: clear to auscultation and percussion with normal breathing effort HEART: regular rate & rhythm and no murmurs and no lower extremity edema ABDOMEN:abdomen soft, non-tender and normal bowel sounds Musculoskeletal:no cyanosis of digits and no clubbing  NEURO: alert & oriented x 3 with fluent speech, no focal motor/sensory deficits  LABORATORY DATA:  I have reviewed the data as listed    Component Value Date/Time   NA 128 (L) 12/03/2018 1034   K 4.0 12/03/2018 1034   CL 94 (L) 12/03/2018 1034   CO2 25 12/03/2018 1034   GLUCOSE 93 12/03/2018 1034   BUN 19 12/03/2018 1034   CREATININE 0.81 12/03/2018 1034   CREATININE 0.76 11/17/2015 1005   CALCIUM  9.2 12/03/2018 1034   PROT 6.7 12/03/2018 1034   ALBUMIN 3.4 (L) 12/03/2018 1034   AST 15 12/03/2018 1034   ALT 11 12/03/2018 1034   ALKPHOS 69 12/03/2018 1034   BILITOT 0.6 12/03/2018 1034   GFRNONAA >60 12/03/2018 1034   GFRAA >60 12/03/2018 1034    No results found for: SPEP, UPEP  Lab Results  Component Value Date   WBC 6.1 12/03/2018   NEUTROABS 3.4 12/03/2018   HGB 11.9 (L) 12/03/2018   HCT 35.0 (L) 12/03/2018   MCV 90.7 12/03/2018   PLT 141 (L) 12/03/2018      Chemistry      Component Value Date/Time   NA 128 (L) 12/03/2018 1034   K 4.0 12/03/2018 1034   CL 94 (L) 12/03/2018 1034   CO2 25 12/03/2018 1034   BUN 19 12/03/2018 1034   CREATININE 0.81 12/03/2018 1034   CREATININE 0.76 11/17/2015 1005      Component Value Date/Time   CALCIUM 9.2 12/03/2018 1034   ALKPHOS 69 12/03/2018 1034   AST 15 12/03/2018 1034   ALT 11 12/03/2018 1034   BILITOT 0.6 12/03/2018 1034      All questions were answered. The patient knows to call the clinic with any problems, questions or concerns. No barriers to learning was detected.  I spent 25 minutes counseling the patient face to face. The total time spent in the appointment was 30 minutes and more than 50% was on counseling and review of test results  Heath Lark, MD 12/03/2018 1:18 PM

## 2018-12-03 NOTE — Assessment & Plan Note (Signed)
She has mild pancytopenia due to treatment We will monitor closely

## 2018-12-03 NOTE — Patient Instructions (Signed)
[facility] Discharge Instructions for Patients Receiving Chemotherapy  Today you received the following chemotherapy agents: Zirabev.  To help prevent nausea and vomiting after your treatment, we encourage you to take your nausea medication as directed.   If you develop nausea and vomiting that is not controlled by your nausea medication, call the clinic.   BELOW ARE SYMPTOMS THAT SHOULD BE REPORTED IMMEDIATELY:  *FEVER GREATER THAN 100.5 F  *CHILLS WITH OR WITHOUT FEVER  NAUSEA AND VOMITING THAT IS NOT CONTROLLED WITH YOUR NAUSEA MEDICATION  *UNUSUAL SHORTNESS OF BREATH  *UNUSUAL BRUISING OR BLEEDING  TENDERNESS IN MOUTH AND THROAT WITH OR WITHOUT PRESENCE OF ULCERS  *URINARY PROBLEMS  *BOWEL PROBLEMS  UNUSUAL RASH Items with * indicate a potential emergency and should be followed up as soon as possible.  Feel free to call the clinic should you have any questions or concerns. The clinic phone number is [phone].  Please show the [facility] at check-in to the Emergency Department and triage nurse.  Bevacizumab injection What is this medicine? BEVACIZUMAB (be va SIZ yoo mab) is a monoclonal antibody. It is used to treat many types of cancer. This medicine may be used for other purposes; ask your health care provider or pharmacist if you have questions. COMMON BRAND NAME(S): Avastin, MVASI, Zirabev What should I tell my health care provider before I take this medicine? They need to know if you have any of these conditions:  diabetes  heart disease  high blood pressure  history of coughing up blood  prior anthracycline chemotherapy (e.g., doxorubicin, daunorubicin, epirubicin)  recent or ongoing radiation therapy  recent or planning to have surgery  stroke  an unusual or allergic reaction to bevacizumab, hamster proteins, mouse proteins, other medicines, foods, dyes, or preservatives  pregnant or trying to get  pregnant  breast-feeding How should I use this medicine? This medicine is for infusion into a vein. It is given by a health care professional in a hospital or clinic setting. Talk to your pediatrician regarding the use of this medicine in children. Special care may be needed. Overdosage: If you think you have taken too much of this medicine contact a poison control center or emergency room at once. NOTE: This medicine is only for you. Do not share this medicine with others. What if I miss a dose? It is important not to miss your dose. Call your doctor or health care professional if you are unable to keep an appointment. What may interact with this medicine? Interactions are not expected. This list may not describe all possible interactions. Give your health care provider a list of all the medicines, herbs, non-prescription drugs, or dietary supplements you use. Also tell them if you smoke, drink alcohol, or use illegal drugs. Some items may interact with your medicine. What should I watch for while using this medicine? Your condition will be monitored carefully while you are receiving this medicine. You will need important blood work and urine testing done while you are taking this medicine. This medicine may increase your risk to bruise or bleed. Call your doctor or health care professional if you notice any unusual bleeding. This medicine should be started at least 28 days following major surgery and the site of the surgery should be totally healed. Check with your doctor before scheduling dental work or surgery while you are receiving this treatment. Talk to your doctor if you have recently had surgery or if you have a wound that has not  healed. Do not become pregnant while taking this medicine or for 6 months after stopping it. Women should inform their doctor if they wish to become pregnant or think they might be pregnant. There is a potential for serious side effects to an unborn child. Talk  to your health care professional or pharmacist for more information. Do not breast-feed an infant while taking this medicine and for 6 months after the last dose. This medicine has caused ovarian failure in some women. This medicine may interfere with the ability to have a child. You should talk to your doctor or health care professional if you are concerned about your fertility. What side effects may I notice from receiving this medicine? Side effects that you should report to your doctor or health care professional as soon as possible:  allergic reactions like skin rash, itching or hives, swelling of the face, lips, or tongue  chest pain or chest tightness  chills  coughing up blood  high fever  seizures  severe constipation  signs and symptoms of bleeding such as bloody or black, tarry stools; red or dark-brown urine; spitting up blood or brown material that looks like coffee grounds; red spots on the skin; unusual bruising or bleeding from the eye, gums, or nose  signs and symptoms of a blood clot such as breathing problems; chest pain; severe, sudden headache; pain, swelling, warmth in the leg  signs and symptoms of a stroke like changes in vision; confusion; trouble speaking or understanding; severe headaches; sudden numbness or weakness of the face, arm or leg; trouble walking; dizziness; loss of balance or coordination  stomach pain  sweating  swelling of legs or ankles  vomiting  weight gain Side effects that usually do not require medical attention (report to your doctor or health care professional if they continue or are bothersome):  back pain  changes in taste  decreased appetite  dry skin  nausea  tiredness This list may not describe all possible side effects. Call your doctor for medical advice about side effects. You may report side effects to FDA at 1-800-FDA-1088. Where should I keep my medicine? This drug is given in a hospital or clinic and will  not be stored at home. NOTE: This sheet is a summary. It may not cover all possible information. If you have questions about this medicine, talk to your doctor, pharmacist, or health care provider.  2020 [employer] (2015-12-25 14:33:29)

## 2018-12-03 NOTE — Assessment & Plan Note (Signed)
Her blood pressure control is satisfactory Due to hyponatremia, I recommend her to hold hydrochlorothiazide and Aldactone

## 2018-12-03 NOTE — Assessment & Plan Note (Addendum)
She is noted to have ulcerated mucositis Continue aggressive supportive care As above, plan to order CT imaging to make sure that she is responding to treatment She tolerated oxycodone poorly I switch her pain management to Dilaudid to take as needed I will assess pain control next week

## 2018-12-05 ENCOUNTER — Ambulatory Visit (INDEPENDENT_AMBULATORY_CARE_PROVIDER_SITE_OTHER): Payer: PPO | Admitting: Cardiovascular Disease

## 2018-12-05 ENCOUNTER — Telehealth: Payer: Self-pay | Admitting: Hematology and Oncology

## 2018-12-05 ENCOUNTER — Other Ambulatory Visit: Payer: Self-pay

## 2018-12-05 ENCOUNTER — Telehealth: Payer: Self-pay

## 2018-12-05 ENCOUNTER — Encounter: Payer: Self-pay | Admitting: Cardiovascular Disease

## 2018-12-05 VITALS — BP 168/82 | HR 60 | Temp 96.3°F | Ht 62.0 in | Wt 142.0 lb

## 2018-12-05 DIAGNOSIS — C561 Malignant neoplasm of right ovary: Secondary | ICD-10-CM | POA: Diagnosis not present

## 2018-12-05 DIAGNOSIS — R001 Bradycardia, unspecified: Secondary | ICD-10-CM

## 2018-12-05 DIAGNOSIS — I10 Essential (primary) hypertension: Secondary | ICD-10-CM

## 2018-12-05 DIAGNOSIS — E039 Hypothyroidism, unspecified: Secondary | ICD-10-CM

## 2018-12-05 DIAGNOSIS — Z7901 Long term (current) use of anticoagulants: Secondary | ICD-10-CM | POA: Diagnosis not present

## 2018-12-05 DIAGNOSIS — E871 Hypo-osmolality and hyponatremia: Secondary | ICD-10-CM | POA: Diagnosis not present

## 2018-12-05 DIAGNOSIS — I480 Paroxysmal atrial fibrillation: Secondary | ICD-10-CM

## 2018-12-05 MED ORDER — AMLODIPINE BESYLATE 5 MG PO TABS: 5.0000 mg | ORAL_TABLET | Freq: Every day | ORAL | 3 refills | Status: DC

## 2018-12-05 NOTE — Progress Notes (Signed)
Cardiology Office Note    Date:  12/07/2018   ID:  HAIDEN CLUCAS, DOB [birth year], MRN [id number]  PCP:  Martinique, Starlett G, MD  Cardiologist:  Shelva Majestic, MD    History of Present Illness:  Haley [name] is a 73 y.o. female who has a history of palpitations, paroxysmal atrial fibrillation, hyperlipidemia, GERD, as well as hypothyroidism. She also has a history of sleep apnea, currently untreated. Since I last saw her, she unfortunately was diagnosed with metastatic ovarian CA and has undergone extensive GYN and oncological evaluation.A preoperative chest CT did not show active cardiopulmonary diseaseand there was no evidence for large central pulmonary embolus. She was noted to have aortic atherosclerosis without aneurysm or dissection.On March 27, 2018 she underwent an exploratory laparotomy with bilateral salpingo-oophorectomy, omentectomy, radical tumor debulking for ovariancancer by Dr. Denman George.She is followed by Dr. Candida Peeling so far has had 2 cycles of chemotherapy. She admits to being fatigued and weak following the treatments but ultimately improves.Prior to her exploratory laparotomy surgery, she underwent a myocardial perfusion study which essentially was normal and did not show evidence for scar or ischemia. Ejection fraction was 57%. She was given operative clearance.  She was evaluated by me in a telemedicine visit on April 24, 2018.  At that time she was feeling fairly well and deniedany episodes of chest tightness or pressure. She does admit to some fatigue. She is unaware of any significant recent palpitations. She states her blood pressure typically was running in the 130/70 range. Her pulse typically is in the 60s to 70s. She denied presyncope or syncope or significant swelling. She admits to some abdominal discomfort. She also has a history of recent neuropathy.  I last saw her in a telemedicine evaluation in August 2020.  She has been monitored by Dr.  Alvy Bimler for her right ovarian epithelial cancer. She developed small petechial hemorrhages at the anterior shin area which were felt likely secondary to bevacizumab.  In addition, her blood pressure had begun to increase in hydrochlorothiazide was added to her medical regimen of amlodipine and metoprolol.  She does experience rare palpitations.  She was sleeping well but at times notes some palpitations in the early morning.  Remotely, she was diagnosed with sleep apnea and apparently did not tolerate CPAP therapy.  She denied any chest tightness.  She admitted to increased stress and at times this resulted in significant blood pressure lability.  Since I last saw her, she has been undergoing treatment by Dr. Alvy Bimler.  Apparently, the patient had self discontinued amlodipine.  An echo Doppler study of October 16, 2018 showed an EF of 60 to 65%.  The average left and a global longitudinal strain was -22.4.  Atrial dimensions were normal.  There was trace MR and mild TR.  There was mild aortic sclerosis without stenosis.  Estimated RV systolic pressure was 88.5 mmHg.  She was recently seen by Dr. Alvy Bimler 2 days ago and due to a serum sodium of 128 it was advised that she hold HCTZ as well as spironolactone.  She stopped taking spironolactone, but was unaware that she should hold HCTZ.  She presents now for follow-up evaluation.  Past Medical History:  Diagnosis Date  . Allergy    SEASONAL  . Anxiety   . Cataract    BILATERAL  . Dysrhythmia   . Family history of lung cancer   . Family history of prostate cancer   . Family history of prostate cancer   . Family  history of uterine cancer   . Fibromyalgia   . GERD (gastroesophageal reflux disease)   . H/O echocardiogram 04/28/08   EF>55% trace mitral regurgitation, No significant valvular pathology  . Hemorrhoids    internal  . History of stress test 02/08/2011   Normal Myocardial perfusion study, this is a low risk scan, No prior study available for  comparison  . Hyperlipidemia   . Hypertension    hx of  . Hypothyroidism   . MVP (mitral valve prolapse)    mild and MR  . OA (osteoarthritis)   . Osteoporosis   . ovarian ca dx'd 12/2017   ovarian cancer  . Paroxysmal A-fib ([place])   . Sleep apnea    does not use prescribed CPAP  does not tolerate  . Thyroid disease    HYPO  . Varicosities of leg   . Vitamin D deficiency     Past Surgical History:  Procedure Laterality Date  . ABDOMINAL HYSTERECTOMY  1989  . APPENDECTOMY  1989   with hysterectomy  . BILATERAL SALPINGECTOMY N/A 03/27/2018   Procedure: EXPLORATORY LAPARATOMY, BILATERAL SALPINGOOPHERECTOMY;  Surgeon: Everitt Amber, MD;  Location: [facility];  Service: Gynecology;  Laterality: N/A;  . BREAST EXCISIONAL BIOPSY Left 1995   Benign  . COLONOSCOPY  11-20-2000  . DEBULKING N/A 03/27/2018   Procedure: RADICAL TUMOR DEBULKING;  Surgeon: Everitt Amber, MD;  Location: [facility];  Service: Gynecology;  Laterality: N/A;  . IR IMAGING GUIDED PORT INSERTION  01/17/2018  . NOSE SURGERY  1990  . OMENTECTOMY N/A 03/27/2018   Procedure: OMENTECTOMY;  Surgeon: Everitt Amber, MD;  Location: [facility];  Service: Gynecology;  Laterality: N/A;    Current Medications: Outpatient Medications Prior to Visit  Medication Sig Dispense Refill  . acetaminophen (TYLENOL) 500 MG tablet Take 2 tablets (1,000 mg total) by mouth every 6 (six) hours. (Patient taking differently: Take 1,000 mg by mouth every 6 (six) hours as needed. ) 30 tablet 0  . ALPRAZolam (XANAX) 0.5 MG tablet Take 0.5-1 tablets (0.25-0.5 mg total) by mouth daily as needed for anxiety. 30 tablet 2  . alum & mag hydroxide-simeth (MAALOX/MYLANTA) 200-200-20 MG/5ML suspension Take 30 mLs by mouth every 6 (six) hours as needed for indigestion or heartburn. 355 mL 0  . Calcium Carbonate (CALTRATE 600) 1500 MG TABS Take 600 mg of elemental calcium by mouth daily.     . cholecalciferol (VITAMIN D) 1000 UNITS tablet Take 1,000 Units by mouth daily.      . fluticasone (FLONASE) 50 MCG/ACT nasal spray Use 2 sprays in each nostril daily (Patient taking differently: Place 2 sprays into both nostrils daily. ) 48 g 2  . HYDROmorphone (DILAUDID) 2 MG tablet Take 1 tablet (2 mg total) by mouth every 4 (four) hours as needed for severe pain. 30 tablet 0  . ibuprofen (ADVIL,MOTRIN) 200 MG tablet Take 600 mg by mouth every 6 (six) hours as needed for moderate pain.     Marland Kitchen lactose free nutrition (BOOST PLUS) LIQD Take 237 mLs by mouth daily. (Patient taking differently: Take 237 mLs by mouth 2 (two) times daily between meals. ) 10 Can 0  . levothyroxine (SYNTHROID) 75 MCG tablet Take 1 tablet (75 mcg total) by mouth daily. 90 tablet 3  . lidocaine-prilocaine (EMLA) cream Apply to affected area once 30 g 3  . magic mouthwash w/lidocaine SOLN Take 5 mLs by mouth 4 (four) times daily. 240 mL 0  . Magnesium 400 MG TABS Take 250 mg by  mouth daily.    . Menthol-Methyl Salicylate (MUSCLE RUB) 10-15 % CREA Apply 1 application topically as needed for muscle pain.    . metoprolol succinate (TOPROL-XL) 50 MG 24 hr tablet Take 1 tablet (50 mg total) by mouth daily. Take with or immediately following a meal. 90 tablet 3  . Multiple Vitamins-Minerals (MULTIVITAMIN WITH MINERALS) tablet Take 1 tablet by mouth daily.      . Omega-3 Fatty Acids (FISH OIL) 1000 MG CAPS Take 2,000 mg by mouth 2 (two) times daily.     Marland Kitchen omeprazole (PRILOSEC) 40 MG capsule Take 1 capsule (40 mg total) by mouth daily. 90 capsule 3  . ondansetron (ZOFRAN) 8 MG tablet Take 1 tablet (8 mg total) by mouth every 8 (eight) hours as needed (Nausea or vomiting). 30 tablet 1  . polyethylene glycol (MIRALAX / GLYCOLAX) packet Take 17 g by mouth daily. 14 each 0  . prochlorperazine (COMPAZINE) 10 MG tablet Take 1 tablet (10 mg total) by mouth every 6 (six) hours as needed (Nausea or vomiting). 30 tablet 1  . rivaroxaban (XARELTO) 20 MG TABS tablet Take 1 tablet (20 mg total) by mouth daily with supper. 30  tablet 0  . senna (SENOKOT) 8.6 MG TABS tablet Take 1 tablet (8.6 mg total) by mouth at bedtime as needed for mild constipation. 120 each 0  . simvastatin (ZOCOR) 20 MG tablet Take 1 tablet (20 mg total) by mouth at bedtime. 90 tablet 1  . vitamin C (ASCORBIC ACID) 500 MG tablet Take 1,000 mg by mouth daily.    Marland Kitchen amLODipine (NORVASC) 10 MG tablet Take 1 tablet (10 mg total) by mouth daily. 90 tablet 3  . hydrochlorothiazide (HYDRODIURIL) 25 MG tablet Take 1 tablet (25 mg total) by mouth daily. 90 tablet 1  . senna-docusate (SENOKOT-S) 8.6-50 MG tablet Take 2 tablets by mouth at bedtime. For AFTER surgery 30 tablet 0  . spironolactone (ALDACTONE) 25 MG tablet Take 1 tablet (25 mg total) by mouth daily. 90 tablet 11  . ferrous gluconate (FERGON) 324 MG tablet Take 324 mg by mouth 3 (three) times daily with meals.    . lidocaine (XYLOCAINE) 2 % solution      No facility-administered medications prior to visit.      Allergies:   Tramadol hcl   Social History   Socioeconomic History  . Marital status: Married    Spouse name: Not on file  . Number of children: Not on file  . Years of education: Not on file  . Highest education level: Not on file  Occupational History  . Not on file  Social Needs  . Financial resource strain: Not on file  . Food insecurity    Worry: Not on file    Inability: Not on file  . Transportation needs    Medical: Not on file    Non-medical: Not on file  Tobacco Use  . Smoking status: Light Tobacco Smoker    Years: 10.00    Types: Cigars  . Smokeless tobacco: Never Used  . Tobacco comment: not daily  [place] cigars 1-2   Substance and Sexual Activity  . Alcohol use: No    Alcohol/week: 0.0 standard drinks  . Drug use: No  . Sexual activity: Yes    Comment: 1st intercourse 17yo-1 partner  Lifestyle  . Physical activity    Days per week: Not on file    Minutes per session: Not on file  . Stress: Not on file  Relationships  .  Social Product manager on phone: Not on file    Gets together: Not on file    Attends religious service: Not on file    Active member of club or organization: Not on file    Attends meetings of clubs or organizations: Not on file    Relationship status: Not on file  Other Topics Concern  . Not on file  Social History Narrative  . Not on file     Family History:  The patient's family history includes Cancer in her paternal aunt; Diabetes in her daughter and mother; Heart disease in her mother; Hypertension in her mother; Lung cancer in her niece; Prostate cancer in her paternal uncle; Stroke in her maternal grandmother; Uterine cancer in her cousin.   ROS General: Negative; No fevers, chills, or night sweats;  HEENT: Negative; No changes in vision or hearing, sinus congestion, difficulty swallowing Pulmonary: Negative; No cough, wheezing, shortness of breath, hemoptysis Cardiovascular: Negative; No chest pain, presyncope, syncope, palpitations GI: Negative; No nausea, vomiting, diarrhea, or abdominal pain GU: Negative; No dysuria, hematuria, or difficulty voiding Musculoskeletal: Negative; no myalgias, joint pain, or weakness Hematologic/Oncology: Negative; no easy bruising, bleeding Endocrine: Negative; no heat/cold intolerance; no diabetes Neuro: Negative; no changes in balance, headaches Skin: Negative; No rashes or skin lesions Psychiatric: Negative; No behavioral problems, depression Sleep: Negative; No snoring, daytime sleepiness, hypersomnolence, bruxism, restless legs, hypnogognic hallucinations, no cataplexy Other comprehensive 14 point system review is negative.   PHYSICAL EXAM:   VS:  BP (!) 168/82   Pulse 60   Temp (!) 96.3 F (35.7 C)   Ht '5\' 2"'$  (1.575 m)   Wt 142 lb (64.4 kg)   SpO2 98%   BMI 25.97 kg/m     Repeat blood pressure by me was 154/84 initially and when taken again was 160/82.  Wt Readings from Last 3 Encounters:  12/05/18 142 lb (64.4 kg)  12/03/18 141 lb 6.4 oz  (64.1 kg)  11/19/18 140 lb 3.2 oz (63.6 kg)    General: Alert, oriented, no distress.  Skin: normal turgor, no rashes, warm and dry HEENT: Normocephalic, atraumatic. Pupils equal round and reactive to light; sclera anicteric; extraocular muscles intact;  Nose without nasal septal hypertrophy Mouth/Parynx benign; Mallinpatti scale 3 Neck: No JVD, no carotid bruits; normal carotid upstroke Lungs: clear to ausculatation and percussion; no wheezing or rales Chest wall: without tenderness to palpitation Heart: PMI not displaced, RRR, s1 s2 normal, 1/6 systolic murmur, no diastolic murmur, no rubs, gallops, thrills, or heaves Abdomen: soft, nontender; no hepatosplenomehaly, BS+; abdominal aorta nontender and not dilated by palpation. Back: no CVA tenderness Pulses 2+ Musculoskeletal: full range of motion, normal strength, no joint deformities Extremities: no clubbing cyanosis or edema, Homan's sign negative  Neurologic: grossly nonfocal; Cranial nerves grossly wnl Psychologic: Normal mood and affect   Studies/Labs Reviewed:   EKG:  EKG is ordered today.  ECG (independently read by me): Sinus bradycardia at 49 bpm.  Left axis deviation.  QS complex V1 V2.  March 08, 2018 ECG (independently read by me): Sinus bradycardia 59 bpm, QS complex anteroseptally  Recent Labs: BMP Latest Ref Rng & Units 12/03/2018 11/19/2018 11/05/2018  Glucose 70 - 99 mg/dL 93 92 80  BUN 8 - 23 mg/dL '19 12 17  '$ Creatinine 0.44 - 1.00 mg/dL 0.81 0.80 0.74  Sodium 135 - 145 mmol/L 128(L) 130(L) 135  Potassium 3.5 - 5.1 mmol/L 4.0 4.4 3.6  Chloride 98 - 111 mmol/L 94(L) 96(L)  99  CO2 22 - 32 mmol/L '25 24 26  '$ Calcium 8.9 - 10.3 mg/dL 9.2 9.4 9.3     Hepatic Function Latest Ref Rng & Units 12/03/2018 11/19/2018 11/05/2018  Total Protein 6.5 - 8.1 g/dL 6.7 7.0 6.9  Albumin 3.5 - 5.0 g/dL 3.4(L) 3.6 3.4(L)  AST 15 - 41 U/L 15 15 14(L)  ALT 0 - 44 U/L '11 12 11  '$ Alk Phosphatase 38 - 126 U/L 69 73 72  Total  Bilirubin 0.3 - 1.2 mg/dL 0.6 0.4 0.4  Bilirubin, Direct 0.0 - 0.3 mg/dL - - -    CBC Latest Ref Rng & Units 12/03/2018 11/19/2018 11/05/2018  WBC 4.0 - 10.5 K/uL 6.1 5.6 5.6  Hemoglobin 12.0 - 15.0 g/dL 11.9(L) 12.3 12.1  Hematocrit 36.0 - 46.0 % 35.0(L) 36.6 35.7(L)  Platelets 150 - 400 K/uL 141(L) 148(L) 132(L)   Lab Results  Component Value Date   MCV 90.7 12/03/2018   MCV 92.2 11/19/2018   MCV 91.8 11/05/2018   Lab Results  Component Value Date   TSH 0.217 (L) 10/22/2018   Lab Results  Component Value Date   HGBA1C 6.0 08/06/2012     BNP No results found for: BNP  ProBNP No results found for: PROBNP   Lipid Panel     Component Value Date/Time   CHOL 157 11/03/2017 1019   TRIG 154.0 (H) 11/03/2017 1019   HDL 56.00 11/03/2017 1019   CHOLHDL 3 11/03/2017 1019   VLDL 30.8 11/03/2017 1019   LDLCALC 70 11/03/2017 1019   LDLDIRECT 84.3 12/16/2010 0921     RADIOLOGY: No results found.   Additional studies/ records that were reviewed today include:  07/11/2018 Echocardiogram   1. The left ventricle has normal systolic function with an ejection fraction of 60-65%. The cavity size was normal. Left ventricular diastolic parameters were normal. 2. Normal GLS -20.1. 3. The right ventricle has normal systolic function. The cavity was normal. There is no increase in right ventricular wall thickness. 4. The mitral valve is degenerative. Moderate thickening of the mitral valve leaflet. Moderate calcification of the mitral valve leaflet. 5. The aortic valve is tricuspid. Moderate thickening of the aortic valve. Moderate calcification of the aortic valve. Aortic valve regurgitation is trivial by color flow Doppler. 6. The aortic root is normal in size and structure.     ECHO 10/16/2018 IMPRESSIONS  1. No significant change from prior study (07/11/2018).  2. Left ventricular ejection fraction, by visual estimation, is 60 to 65%. The left ventricle has normal  function. Normal left ventricular size. There is no left ventricular hypertrophy.  3. LVEF by 3D assessment 63%.  4. The average left ventricular global longitudinal strain is -22.4 %.  5. Global right ventricle has normal systolic function.The right ventricular size is normal. No increase in right ventricular wall thickness.  6. Left atrial size was normal.  7. Right atrial size was normal.  8. Presence of pericardial fat pad.  9. Moderate thickening of the mitral valve leaflet(s). 10. The mitral valve is normal in structure. Trace mitral valve regurgitation. 11. The tricuspid valve is normal in structure. Tricuspid valve regurgitation is mild. 12. Aortic regurgitation PHT measures 467 msec. 13. The aortic valve is tricuspid Aortic valve regurgitation is mild by color flow Doppler. Mild aortic valve sclerosis without stenosis. 14. There is mild to moderate calcification of the AoV, with focal calcification of the [place]. The aortic regurgitation is mild in severity, likely related to degenerative valve disease. 15. The  pulmonic valve was grossly normal. Pulmonic valve regurgitation is not visualized by color flow Doppler. 16. Mild plaque invoving the ascending aorta. 17. Normal pulmonary artery systolic pressure. 18. The tricuspid regurgitant velocity is 2.52 m/s, and with an assumed right atrial pressure of 3 mmHg, the estimated right ventricular systolic pressure is normal at 28.4 mmHg.   ASSESSMENT:    1. Essential hypertension   2. Right ovarian epithelial cancer ([place])   3. PAF (paroxysmal atrial fibrillation) ([place])   4. Chronic anticoagulation   5. Sinus bradycardia   6. Hypothyroidism, unspecified type   7. Hyponatremia      PLAN:  1.  Essential hypertension: When last evaluated by me she was noted to have blood pressure lability HCTZ was added to amlodipine 10 mg in addition to her Toprol-XL 50 mg.  Subsequently she had been started on spironolactone.  Patient self discontinued  the amlodipine.  When seen by Dr. Arabella Merles 2 days ago she was advised to hold HCTZ and spironolactone due to recent low serum sodium at 128.  Apparently she had held his spironolactone but did not yet hold the HCTZ.  With her blood pressure elevation today I have recommended resumption of a lower dose of amlodipine at 5 mg which she had tolerated in the past.  2.  Hyponatremia: Most recent sodium 128, spironolactone and HCTZ to be held.  3.  Right ovarian epithelial cancer.  She is followed by Dr.Gorsuch.  She has not tolerated treatment well despite adjustments when last seen by Dr. Course that she was concerned of rising tumor marker.  She received another treatment on December 03, 2011.  Plans are to follow-up CT imaging next week.   4.  Hyperlipidemia: She continues to be on simvastatin 20 mg with target LDL less than 70.  5.  Palpitations: Resolved with Toprol-XL.  Sinus bradycardia noted on ECG today.  No recurrent PAF.  6.  Hypothyroidism: Currently on levothyroxine 75 mcg  7.  Obstructive sleep apnea: Moderate by PSG.  Did not tolerate CPAP.  Previously discussed alternative such as customized oral appliance.  8.  Anticoagulated: No recurrent AF on Xarelto  Medication Adjustments/Labs and Tests Ordered: Current medicines are reviewed at length with the patient today.  Concerns regarding medicines are outlined above.  Medication changes, Labs and Tests ordered today are listed in the Patient Instructions below. Patient Instructions  Medication Instructions:  Your physician has recommended you make the following change in your medication:  STOP TAKING HYDROCHLOROTHIAZIDE AND SPIRONOLACTONE  RESTART AMLODIPINE 5 MG BY MOUTH DAILY  CONTACT OUR OFFICE NEXT WEEK FOR UPDATES ON AVAILABILITY OF SAMPLE FOR XARELTO *If you need a refill on your cardiac medications before your next appointment, please call your pharmacy*  Lab Work: [facility] WORK If you have labs  (blood work) drawn today and your tests are completely normal, you will receive your results only by: Marland Kitchen MyChart Message (if you have MyChart) OR . A paper copy in the mail If you have any lab test that is abnormal or we need to change your treatment, we will call you to review the results.  Testing/Procedures: NONE  Follow-Up: At [facility], you and your health needs are our priority.  As part of our continuing mission to provide you with exceptional heart care, we have created designated Provider Care Teams.  These Care Teams include your primary Cardiologist (physician) and Advanced Practice Providers (APPs -  Physician Assistants and Nurse Practitioners) who all  work together to provide you with the care you need, when you need it.  Your next appointment:   4 month(s)  The format for your next appointment:   In Person  Provider:   Shelva Majestic, MD       Signed, Shelva Majestic, MD  12/07/2018 7:55 PM    [facility] [address] [place], [place], [place]  [address] Phone: [phone]

## 2018-12-05 NOTE — Telephone Encounter (Signed)
Spoke with pt by phone to set up appt with Dr Alvy Bimler.  Pt agreeable to come in for office visit on 12/3 at 12:30.  Msg sent to schedulers.

## 2018-12-05 NOTE — Patient Instructions (Signed)
Medication Instructions:  Your physician has recommended you make the following change in your medication:  STOP TAKING HYDROCHLOROTHIAZIDE AND SPIRONOLACTONE  RESTART AMLODIPINE 5 MG BY MOUTH DAILY  CONTACT OUR OFFICE NEXT WEEK FOR UPDATES ON AVAILABILITY OF SAMPLE FOR XARELTO *If you need a refill on your cardiac medications before your next appointment, please call your pharmacy*  Lab Work: [facility] LAB WORK If you have labs (blood work) drawn today and your tests are completely normal, you will receive your results only by: Marland Kitchen MyChart Message (if you have MyChart) OR . A paper copy in the mail If you have any lab test that is abnormal or we need to change your treatment, we will call you to review the results.  Testing/Procedures: NONE  Follow-Up: At [facility], you and your health needs are our priority.  As part of our continuing mission to provide you with exceptional heart care, we have created designated Provider Care Teams.  These Care Teams include your primary Cardiologist (physician) and Advanced Practice Providers (APPs -  Physician Assistants and Nurse Practitioners) who all work together to provide you with the care you need, when you need it.  Your next appointment:   4 month(s)  The format for your next appointment:   In Person  Provider:   Shelva Majestic, MD

## 2018-12-05 NOTE — Telephone Encounter (Signed)
Scheduled apt per 11/25 sch message  - pt is aware of appt date and time

## 2018-12-07 ENCOUNTER — Encounter: Payer: Self-pay | Admitting: Cardiovascular Disease

## 2018-12-10 ENCOUNTER — Telehealth: Payer: Self-pay | Admitting: Cardiovascular Disease

## 2018-12-10 ENCOUNTER — Other Ambulatory Visit: Payer: Self-pay | Admitting: Pharmacist

## 2018-12-10 NOTE — Telephone Encounter (Signed)
Spoke with pt cont to note swelling to feet even after reducing Amlodipine to 5 mg  Also is needing Xarelto samples  and pt aware no samples available at this time Will check again this week  Pt's B/p this am was 138/78 and HR 55 Will forward message to Dr Claiborne Billings for review and recommendations ./cy

## 2018-12-10 NOTE — Telephone Encounter (Signed)
Patient calling the office for samples of medication:   1.  What medication and dosage are you requesting samples for? Xarelto   2.  Are you currently out of this medication? Yes. Patient also would like for a nurse to call her. She also states that Dr. Claiborne Billings told her to tell us to to hold them for her.

## 2018-12-10 NOTE — Patient Outreach (Signed)
[facility]) Care Management [place]  12/10/2018  Haley [name] [birth year] [id number]  Reason for referral: medication assistance  [facility] pharmacy case is being closed due to the following reasons:  -Will close [facility] pharmacy case as patient does not qualify for patient assistance. -Thank you for allowing [facility] pharmacy to be involved in this patient's care.    Regina Eck, PharmD, [place]  [phone]

## 2018-12-11 ENCOUNTER — Ambulatory Visit (HOSPITAL_COMMUNITY)
Admission: RE | Admit: 2018-12-11 | Discharge: 2018-12-11 | Disposition: A | Discharge status: Home / Self Care | Payer: PPO | Source: Ambulatory Visit | Attending: Hematology and Oncology | Admitting: Hematology and Oncology

## 2018-12-11 ENCOUNTER — Other Ambulatory Visit: Payer: Self-pay

## 2018-12-11 DIAGNOSIS — C561 Malignant neoplasm of right ovary: Secondary | ICD-10-CM

## 2018-12-11 DIAGNOSIS — C569 Malignant neoplasm of unspecified ovary: Secondary | ICD-10-CM | POA: Diagnosis not present

## 2018-12-11 MED ORDER — IOHEXOL 300 MG/ML  SOLN
100.0000 mL | Freq: Once | INTRAMUSCULAR | Status: AC | PRN
  Administered 2018-12-11: 100 mL via INTRAVENOUS

## 2018-12-11 MED ORDER — SODIUM CHLORIDE (PF) 0.9 % IJ SOLN
INTRAMUSCULAR | Status: AC
  Filled 2018-12-11: qty 50

## 2018-12-13 ENCOUNTER — Encounter: Payer: Self-pay | Admitting: Hematology and Oncology

## 2018-12-13 ENCOUNTER — Inpatient Hospital Stay: Payer: PPO

## 2018-12-13 ENCOUNTER — Other Ambulatory Visit: Payer: Self-pay

## 2018-12-13 ENCOUNTER — Other Ambulatory Visit: Payer: Self-pay | Admitting: Hematology and Oncology

## 2018-12-13 ENCOUNTER — Telehealth: Payer: Self-pay | Admitting: Oncology

## 2018-12-13 ENCOUNTER — Inpatient Hospital Stay: Payer: PPO | Attending: Obstetrics | Admitting: Hematology and Oncology

## 2018-12-13 DIAGNOSIS — E538 Deficiency of other specified B group vitamins: Secondary | ICD-10-CM

## 2018-12-13 DIAGNOSIS — C561 Malignant neoplasm of right ovary: Secondary | ICD-10-CM

## 2018-12-13 DIAGNOSIS — T451X5A Adverse effect of antineoplastic and immunosuppressive drugs, initial encounter: Secondary | ICD-10-CM | POA: Diagnosis not present

## 2018-12-13 DIAGNOSIS — E039 Hypothyroidism, unspecified: Secondary | ICD-10-CM

## 2018-12-13 DIAGNOSIS — G620 Drug-induced polyneuropathy: Secondary | ICD-10-CM

## 2018-12-13 DIAGNOSIS — I10 Essential (primary) hypertension: Secondary | ICD-10-CM | POA: Diagnosis not present

## 2018-12-13 DIAGNOSIS — L271 Localized skin eruption due to drugs and medicaments taken internally: Secondary | ICD-10-CM

## 2018-12-13 LAB — VITAMIN B12: Vitamin B-12: 277 pg/mL (ref 180–914)

## 2018-12-13 LAB — TSH: TSH: 2.511 u[IU]/mL (ref 0.308–3.960)

## 2018-12-13 NOTE — Assessment & Plan Note (Signed)
This is slowly improving Continue conservative management and surveillance

## 2018-12-13 NOTE — Telephone Encounter (Signed)
Today is just a visit to review scan results I can order it to be drawn in her next visit

## 2018-12-13 NOTE — Telephone Encounter (Signed)
Haley [name] called and asked if she can have labs drawn today for TSH and B12. She said her PCP, Dr. Martinique manages them and she would like to have them done today to avoid another office visit/potential Covid exposure.  She would like her port accessed if the lab draw is scheduled.

## 2018-12-13 NOTE — Telephone Encounter (Signed)
Let Nyhla know that labs will be ordered at her next visit.  She verbalized understanding and wants to let Dr. Alvy Bimler know that she is having tingling in her right hand that goes up her arm and also has been having chills on an off "for a good while".  She denies having an elevated temperature.

## 2018-12-13 NOTE — Assessment & Plan Note (Signed)
I have reviewed blood work and imaging study with the patient and her husband Even though her tumor marker is elevated, she has minimum disease seen on CT She does not tolerate treatment well with mucositis, hypertension, hand-foot syndrome and others We agreed to take a treatment break I plan to see her next month for further follow-up we repeat blood work and physical exam

## 2018-12-13 NOTE — Progress Notes (Signed)
[place] OFFICE PROGRESS NOTE  Patient Care Team: [place], Indiyah G, MD as PCP - General (Family Medicine) Troy Sine, MD as PCP - Cardiology (Cardiology) Awanda Mink Craige Cotta, RN as Oncology Nurse Navigator (Oncology)  ASSESSMENT & PLAN:  Right ovarian epithelial cancer [facility]) I have reviewed blood work and imaging study with the patient and her husband Even though her tumor marker is elevated, she has minimum disease seen on CT She does not tolerate treatment well with mucositis, hypertension, hand-foot syndrome and others We agreed to take a treatment break I plan to see her next month for further follow-up we repeat blood work and physical exam  Hand foot syndrome This is slowly improving Continue conservative management and surveillance  Peripheral neuropathy due to chemotherapy [facility]) She has intermittent neuropathic pain likely due to previous side effects of treatment Given poor tolerance to medications, I recommend surveillance only and pain medicine as needed  Essential hypertension, benign Her blood pressure is stable She will continue close monitoring of blood pressure I will attempt to wean her off blood pressure medications in the future with discontinuation of bevacizumab   No orders of the defined types were placed in this encounter.   INTERVAL HISTORY: Please see below for problem oriented charting. She returns with her husband for further follow-up She complains of numbness and tingling of her right hand that comes and goes Her mouth sores has improved She continues to have mild intermittent ankle swelling Denies recent nausea, bloating or changes in bowel habits  SUMMARY OF ONCOLOGIC HISTORY: Oncology History Overview Note  Neg for genetics blood work and HRD   Right ovarian epithelial cancer ([place])  01/06/2018 Imaging   Ct abdomen and pelvis 1. Complex cystic mass at the right adnexa, measuring 5.9 x 4.0 cm, with nodular components,  concerning for primary ovarian malignancy. 2. Diffuse nodularity along the omentum at the left side of the abdomen, extending into the mesentery at the left mid abdomen, concerning for peritoneal carcinomatosis. 3. Wall thickening at the distal ileum adjacent to the ovarian mass; bowel loops appear somewhat adherent to the ovarian mass. Bowel infiltration with tumor cannot be excluded. No evidence of bowel obstruction at this time. 4. Small volume ascites within the abdomen and pelvis.  Aortic Atherosclerosis (ICD10-I70.0).   01/08/2018 Tumor Marker   Patient's tumor was tested for the following markers: CA-125. Results of the tumor marker test revealed 3004   01/15/2018 Imaging   Chest CT:  1. No active cardiopulmonary disease. 2. Aortic atherosclerosis without aneurysm or dissection. 3. No large central pulmonary embolus.  CT AP:  1. Dilated fluid-filled loops of small bowel are redemonstrated slightly more extensive than on prior exam with transition point likely in the right adnexa adjacent to a complex cystic mass concerning for ovarian neoplasm given septations and soft tissue nodularity. This raises concern for early or partial SBO. This soft tissue mass measures 4.9 x 4 x 4.7 cm and has not changed since prior recent comparison. Additional short segmental area of luminal narrowing is noted in the right lower quadrant involving small bowel for which stigmata of peritoneal carcinomatosis or small-bowel metastatic implants might account for this. 2. Redemonstration of small volume of ascites predominantly in the upper abdomen surrounding the liver and spleen. 3. Redemonstration of thick bandlike omental thickening concerning for peritoneal carcinomatosis.    01/15/2018 Procedure   Successful ultrasound-guided diagnostic and therapeutic paracentesis yielding 1.5 liters of peritoneal fluid.    01/15/2018 Pathology Results  PERITONEAL/ASCITIC FLUID (SPECIMEN 1 OF 1 COLLECTED  01/18/18): MALIGNANT CELLS CONSISTENT WITH METASTATIC ADENOCARCINOMA. SEE COMMENT. COMMENT: THE MALIGNANT CELLS ARE POSITIVE FOR MOC-31, CYTOKERATIN 7, ESTROGEN RECEPTOR, PAX-8, AND WT-1. THEY ARE NEGATIVE FOR CALRETININ, CYTOKERATIN 5/6, AND CYTOKERATIN 20. THE PROFILE IS CONSISTENT WITH A PRIMARY GYNECOLOGIC CARCINOMA. THERE IS LIKELY SUFFICIENT TUMOR PRESENT, IF ADDITIONAL STUDIES ARE REQUESTED.   01/15/2018 - 02/02/2018 Hospital Admission   She was admitted to the hospital for SBO. She was treated with chemotherapy   01/18/2018 - 06/20/2018 Chemotherapy   The patient had carboplatin and taxol   02/08/2018 Cancer Staging   Staging form: Ovary, Fallopian Tube, and Primary Peritoneal Carcinoma, AJCC 8th Edition - Clinical: cT3, cN0, cM0 - Signed by Heath Lark, MD on 02/08/2018   02/08/2018 Tumor Marker   Patient's tumor was tested for the following markers: CA-125. Results of the tumor marker test revealed 445   03/02/2018 Tumor Marker   Patient's tumor was tested for the following markers: CA-125. Results of the tumor marker test revealed 174   03/15/2018 Imaging   6.2 cm complex cystic right ovarian mass, compatible with malignant ovarian neoplasm, mildly progressive.  Associated peritoneal disease/omental caking beneath the anterior abdominal wall, mildly improved. Prior abdominal ascites is improved/resolved.  4.7 cm cystic left ovarian mass, without overt malignant features, grossly unchanged.  Mild bilateral hydroureteronephrosis, secondary to extrinsic compression, new.  Prior small bowel obstruction has improved/resolved.    03/27/2018 Pathology Results   1. Omentum, resection for tumor - OMENTUM: - HIGH GRADE SEROUS CARCINOMA. 2. Ovary, left - LEFT OVARY: - HIGH GRADE SEROUS CARCINOMA. - LEFT FALLOPIAN TUBE: - HIGH GRADE SEROUS CARCINOMA. 3. Adnexa - ovary +/- tube, neoplastic, right - RIGHT OVARY: - HIGH GRADE SEROUS CARCINOMA, 5.5 CM. - RIGHT FALLOPIAN TUBE: - HIGH  GRADE SEROUS CARCINOMA. 4. Peritoneum, biopsy, nodule - PERITONEUM: - NODULES OF HIGH GRADE SEROUS CARCINOMA. 5. Peritoneum, resection for tumor, rectosigmoid - RECTOSIGMOID PERITONEUM: - HIGH GRADE SEROUS CARCINOMA. Microscopic Comment 3. OVARY or FALLOPIAN TUBE or PRIMARY PERITONEUM: Procedure: Bilateral salpingo-oophorectomy, omentectomy and peritoneal biopsies. Specimen Integrity: N/A. Tumor Site: Right ovary. Ovarian Surface Involvement (required only if applicable): Yes. Fallopian Tube Surface Involvement (required only if applicable): Yes. Tumor Size: 5.5 x 4.8 x 4.0 cm. Histologic Type: Serous carcinoma. Histologic Grade: High grade. Implants (required for advanced stage serous/seromucinous borderline tumors only): Omentum, left ovary and fallopian tube, rectosigmoid peritoneum and peritoneum. Other Tissue/ Organ Involvement: Left ovary and fallopian tube, omentum and rectosigmoid peritoneum. Largest Extrapelvic Peritoneal Focus (required only if applicable): 61.6 cm, omentum. Peritoneal/Ascitic Fluid: N/A. Treatment Effect (required only for high-grade serous carcinomas): Minimal. Regional Lymph Nodes: No lymph nodes submitted. Pathologic Stage Classification (pTNM, AJCC 8th Edition): pT3c, pNX. Representative Tumor Block: 3A, 3B, 3D, 3E and 47F. Comment(s): There is a 5.5 cm in greatest dimension partially cystic high grade serous carcinoma involving the right adnexal specimen and the tumor is staged as a primary right ovarian carcinoma. The carcinoma also involves the left ovary as well as the left fallopian tube, the omentum, peritoneum and the rectosigmoid peritoneal specimens.   03/27/2018 Surgery   Preoperative Diagnosis: ovarian cancer, stage IIIC, metastatic to omentum, peritoneum, serosa of intestines   Procedure(s) Performed: 1. Exploratory laparotomy with bilateral salpingo-oophorectomy, omentectomy radical tumor debulking for ovarian cancer .  Surgeon: Thereasa Solo, MD.   Specimens: Bilateral tubes / ovaries, omentum. Peritoneal nodules, rectosigmoid nodules.    Operative Findings: omental cake, miliary studding on diaphragm and bilateral paracolic gutters.  Sigmoid colon densely adherent to left and right ovaries with tumor rind, ureters mildly dilated bilaterally due to retroperitoneal extension of the tumor towards ureters causing compression.    This represented an optimal cytoreduction (R1) with gross residual disease on the diaphragm that had been ablated and a thin tumor plaque on the sigmoid colon that was ablated. No residual disease >1cm remaining   05/07/2018 Genetic Testing   Negative genetic testing on the [employer] TumorNextHRD+ CancerNext Panel. The CancerNext gene panel offered by [employer] includes sequencing and rearrangement analysis for the following 34 genes:   APC, ATM, BARD1, BMPR1A, BRCA1, BRCA2, BRIP1, CDH1, CDK4, CDKN2A, CHEK2, DICER1, EPCAM, GREM1, HOXB13, MLH1, MRE11A, MSH2, MSH6, MUTYH, NBN, NF1, PALB2, PMS2, POLD1, POLE, PTEN, RAD50, RAD51C, RAD51D, SMAD4, SMARCA4, STK11, and TP53. Somatic genes analyzed through TumorNext-HRD: ATM, BARD1, BRCA1, BRCA2, BRIP1, CHEK2, MRE11A, NBN, PALB2, RAD51C, RAD51D. The report date is 05/07/2018.   05/11/2018 Tumor Marker   Patient's tumor was tested for the following markers: CA-125. Results of the tumor marker test revealed 60.5   07/02/2018 Imaging   1. Mild right pericardiophrenic adenopathy is mildly increased, suspicious for metastatic nodes. No abdominopelvic adenopathy. 2. Small left peritoneal soft tissue nodule adjacent to the splenic flexure and smooth left pelvic sidewall peritoneal thickening, cannot exclude residual/recurrent disease. Attention on follow-up CT advised. 3. Bilateral renal collecting system dilatation has improved. 4.  Aortic Atherosclerosis (ICD10-I70.0).   07/02/2018 Tumor Marker   Patient's tumor was tested for the following markers: CA-125 Results of the  tumor marker test revealed 42.7   07/11/2018 Echocardiogram   1. The left ventricle has normal systolic function with an ejection fraction of 60-65%. The cavity size was normal. Left ventricular diastolic parameters were normal.  2. Normal GLS -20.1.  3. The right ventricle has normal systolic function. The cavity was normal. There is no increase in right ventricular wall thickness.  4. The mitral valve is degenerative. Moderate thickening of the mitral valve leaflet. Moderate calcification of the mitral valve leaflet.  5. The aortic valve is tricuspid. Moderate thickening of the aortic valve. Moderate calcification of the aortic valve. Aortic valve regurgitation is trivial by color flow Doppler.  6. The aortic root is normal in size and structure.    Chemotherapy   The patient had doxorubicin and bevacizumab for chemotherapy treatment.     07/30/2018 Tumor Marker   Patient's tumor was tested for the following markers: CA-125 Results of the tumor marker test revealed 82.6   08/27/2018 Tumor Marker   Patient's tumor was tested for the following markers: CA-125 Results of the tumor marker test revealed 111.   09/24/2018 Tumor Marker   Patient's tumor was tested for the following markers: CA-125 Results of the tumor marker test revealed 142.   10/05/2018 Imaging   1. Stable right juxta diaphragmatic/pericardiophrenic lymph node, mildly enlarged. 2. Otherwise no definite metastatic disease identified in the abdomen/pelvis. 3. 3 mm peritoneal nodule noted left paracolic gutter on today's study, indeterminate. Attention on follow-up recommended. 4.  Aortic Atherosclerois (ICD10-170.0)   10/16/2018 Echocardiogram    1. No significant change from prior study (07/11/2018).  2. Left ventricular ejection fraction, by visual estimation, is 60 to 65%. The left ventricle has normal function. Normal left ventricular size. There is no left ventricular hypertrophy.  3. LVEF by 3D assessment 63%.  4. The  average left ventricular global longitudinal strain is -22.4 %.  5. Global right ventricle has normal systolic function.The right ventricular size is normal. No  increase in right ventricular wall thickness.  6. Left atrial size was normal.  7. Right atrial size was normal.  8. Presence of pericardial fat pad.  9. Moderate thickening of the mitral valve leaflet(s). 10. The mitral valve is normal in structure. Trace mitral valve regurgitation. 11. The tricuspid valve is normal in structure. Tricuspid valve regurgitation is mild. 12. Aortic regurgitation PHT measures 467 msec. 13. The aortic valve is tricuspid Aortic valve regurgitation is mild by color flow Doppler. Mild aortic valve sclerosis without stenosis. 14. There is mild to moderate calcification of the AoV, with focal calcification of the [place]. The aortic regurgitation is mild in severity, likely related to degenerative valve disease. 15. The pulmonic valve was grossly normal. Pulmonic valve regurgitation is not visualized by color flow Doppler. 16. Mild plaque invoving the ascending aorta. 17. Normal pulmonary artery systolic pressure. 18. The tricuspid regurgitant velocity is 2.52 m/s, and with an assumed right atrial pressure of 3 mmHg, the estimated right ventricular systolic pressure is normal at 28.4 mmHg.   10/26/2018 Tumor Marker   Patient's tumor was tested for the following markers: CA-125 Results of the tumor marker test revealed 196   12/11/2018 Imaging   1. No substantial interval change in exam. No definite findings to suggest recurrent/metastatic disease. 2. Right pericardial phrenic lymph node noted on multiple prior studies is unchanged in the interval. 3. 8 mm omental nodule identified on today's exam. Close attention on follow-up recommended. 3 mm soft tissue nodule along the left paracolic gutter seen previously has resolved in the interval. 4.  Aortic Atherosclerois (ICD10-170.0)     REVIEW OF SYSTEMS:    Constitutional: Denies fevers, chills or abnormal weight loss Eyes: Denies blurriness of vision Ears, nose, mouth, throat, and face: Denies mucositis or sore throat Respiratory: Denies cough, dyspnea or wheezes Cardiovascular: Denies palpitation, chest discomfort or lower extremity swelling Gastrointestinal:  Denies nausea, heartburn or change in bowel habits Skin: Denies abnormal skin rashes Lymphatics: Denies new lymphadenopathy or easy bruising Neurological:Denies numbness, tingling or new weaknesses Behavioral/Psych: Mood is stable, no new changes  All other systems were reviewed with the patient and are negative.  I have reviewed the past medical history, past surgical history, social history and family history with the patient and they are unchanged from previous note.  ALLERGIES:  is allergic to tramadol hcl.  MEDICATIONS:  Current Outpatient Medications  Medication Sig Dispense Refill  . acetaminophen (TYLENOL) 500 MG tablet Take 2 tablets (1,000 mg total) by mouth every 6 (six) hours. (Patient taking differently: Take 1,000 mg by mouth every 6 (six) hours as needed. ) 30 tablet 0  . ALPRAZolam (XANAX) 0.5 MG tablet Take 0.5-1 tablets (0.25-0.5 mg total) by mouth daily as needed for anxiety. 30 tablet 2  . alum & mag hydroxide-simeth (MAALOX/MYLANTA) 200-200-20 MG/5ML suspension Take 30 mLs by mouth every 6 (six) hours as needed for indigestion or heartburn. 355 mL 0  . amLODipine (NORVASC) 5 MG tablet Take 1 tablet (5 mg total) by mouth daily. 90 tablet 3  . Calcium Carbonate (CALTRATE 600) 1500 MG TABS Take 600 mg of elemental calcium by mouth daily.     . cholecalciferol (VITAMIN D) 1000 UNITS tablet Take 1,000 Units by mouth daily.     . ferrous gluconate (FERGON) 324 MG tablet Take 324 mg by mouth 3 (three) times daily with meals.    . fluticasone (FLONASE) 50 MCG/ACT nasal spray Use 2 sprays in each nostril daily (Patient taking differently: Place 2  sprays into both nostrils  daily. ) 48 g 2  . HYDROmorphone (DILAUDID) 2 MG tablet Take 1 tablet (2 mg total) by mouth every 4 (four) hours as needed for severe pain. 30 tablet 0  . ibuprofen (ADVIL,MOTRIN) 200 MG tablet Take 600 mg by mouth every 6 (six) hours as needed for moderate pain.     Marland Kitchen lactose free nutrition (BOOST PLUS) LIQD Take 237 mLs by mouth daily. (Patient taking differently: Take 237 mLs by mouth 2 (two) times daily between meals. ) 10 Can 0  . levothyroxine (SYNTHROID) 75 MCG tablet Take 1 tablet (75 mcg total) by mouth daily. 90 tablet 3  . lidocaine (XYLOCAINE) 2 % solution     . lidocaine-prilocaine (EMLA) cream Apply to affected area once 30 g 3  . magic mouthwash w/lidocaine SOLN Take 5 mLs by mouth 4 (four) times daily. 240 mL 0  . Magnesium 400 MG TABS Take 250 mg by mouth daily.    . Menthol-Methyl Salicylate (MUSCLE RUB) 10-15 % CREA Apply 1 application topically as needed for muscle pain.    . metoprolol succinate (TOPROL-XL) 50 MG 24 hr tablet Take 1 tablet (50 mg total) by mouth daily. Take with or immediately following a meal. 90 tablet 3  . Multiple Vitamins-Minerals (MULTIVITAMIN WITH MINERALS) tablet Take 1 tablet by mouth daily.      . Omega-3 Fatty Acids (FISH OIL) 1000 MG CAPS Take 2,000 mg by mouth 2 (two) times daily.     Marland Kitchen omeprazole (PRILOSEC) 40 MG capsule Take 1 capsule (40 mg total) by mouth daily. 90 capsule 3  . ondansetron (ZOFRAN) 8 MG tablet Take 1 tablet (8 mg total) by mouth every 8 (eight) hours as needed (Nausea or vomiting). 30 tablet 1  . polyethylene glycol (MIRALAX / GLYCOLAX) packet Take 17 g by mouth daily. 14 each 0  . prochlorperazine (COMPAZINE) 10 MG tablet Take 1 tablet (10 mg total) by mouth every 6 (six) hours as needed (Nausea or vomiting). 30 tablet 1  . rivaroxaban (XARELTO) 20 MG TABS tablet Take 1 tablet (20 mg total) by mouth daily with supper. 30 tablet 0  . senna (SENOKOT) 8.6 MG TABS tablet Take 1 tablet (8.6 mg total) by mouth at bedtime as needed  for mild constipation. 120 each 0  . simvastatin (ZOCOR) 20 MG tablet Take 1 tablet (20 mg total) by mouth at bedtime. 90 tablet 1  . vitamin C (ASCORBIC ACID) 500 MG tablet Take 1,000 mg by mouth daily.     No current facility-administered medications for this visit.     PHYSICAL EXAMINATION: ECOG PERFORMANCE STATUS: 1 - Symptomatic but completely ambulatory  Vitals:   12/13/18 1247  BP: (!) 143/67  Pulse: 61  Resp: 18  Temp: 98.5 F (36.9 C)  SpO2: 100%   Filed Weights   12/13/18 1247  Weight: 144 lb 8 oz (65.5 kg)    GENERAL:alert, no distress and comfortable Musculoskeletal:no cyanosis of digits and no clubbing  NEURO: alert & oriented x 3 with fluent speech, no focal motor/sensory deficits  LABORATORY DATA:  I have reviewed the data as listed    Component Value Date/Time   NA 128 (L) 12/03/2018 1034   K 4.0 12/03/2018 1034   CL 94 (L) 12/03/2018 1034   CO2 25 12/03/2018 1034   GLUCOSE 93 12/03/2018 1034   BUN 19 12/03/2018 1034   CREATININE 0.81 12/03/2018 1034   CREATININE 0.76 11/17/2015 1005   CALCIUM 9.2 12/03/2018 1034  PROT 6.7 12/03/2018 1034   ALBUMIN 3.4 (L) 12/03/2018 1034   AST 15 12/03/2018 1034   ALT 11 12/03/2018 1034   ALKPHOS 69 12/03/2018 1034   BILITOT 0.6 12/03/2018 1034   GFRNONAA >60 12/03/2018 1034   GFRAA >60 12/03/2018 1034    No results found for: SPEP, UPEP  Lab Results  Component Value Date   WBC 6.1 12/03/2018   NEUTROABS 3.4 12/03/2018   HGB 11.9 (L) 12/03/2018   HCT 35.0 (L) 12/03/2018   MCV 90.7 12/03/2018   PLT 141 (L) 12/03/2018      Chemistry      Component Value Date/Time   NA 128 (L) 12/03/2018 1034   K 4.0 12/03/2018 1034   CL 94 (L) 12/03/2018 1034   CO2 25 12/03/2018 1034   BUN 19 12/03/2018 1034   CREATININE 0.81 12/03/2018 1034   CREATININE 0.76 11/17/2015 1005      Component Value Date/Time   CALCIUM 9.2 12/03/2018 1034   ALKPHOS 69 12/03/2018 1034   AST 15 12/03/2018 1034   ALT 11 12/03/2018  1034   BILITOT 0.6 12/03/2018 1034       RADIOGRAPHIC STUDIES: I have reviewed imaging study with the patient and her husband I have personally reviewed the radiological images as listed and agreed with the findings in the report. Ct Abdomen Pelvis W Contrast  Result Date: 12/11/2018 CLINICAL DATA:  Ovarian cancer.  Restaging. EXAM: CT ABDOMEN AND PELVIS WITH CONTRAST TECHNIQUE: Multidetector CT imaging of the abdomen and pelvis was performed using the standard protocol following bolus administration of intravenous contrast. CONTRAST:  153m OMNIPAQUE IOHEXOL 300 MG/ML  SOLN COMPARISON:  10/05/2018 FINDINGS: Lower chest: Unremarkable. Hepatobiliary: 14 mm low-density lesion in the dome of the left liver is also stable, compatible with a benign cyst. Other stable tiny hypodensities in the lateral segment left liver, compatible with cysts. There is no evidence for gallstones, gallbladder wall thickening, or pericholecystic fluid. No intrahepatic or extrahepatic biliary dilation. Pancreas: No focal mass lesion. No dilatation of the main duct. No intraparenchymal cyst. No peripancreatic edema. Spleen: No splenomegaly. No focal mass lesion. Adrenals/Urinary Tract: No adrenal nodule or mass. Kidneys unremarkable. No evidence for hydroureter. The urinary bladder appears normal for the degree of distention. Stomach/Bowel: Stomach is unremarkable. No gastric wall thickening. No evidence of outlet obstruction. Duodenum is normally positioned as is the ligament of Treitz. No small bowel wall thickening. No small bowel dilatation. The terminal ileum is normal. The appendix is not visualized, but there is no edema or inflammation in the region of the cecum. No gross colonic mass. No colonic wall thickening. Vascular/Lymphatic: There is abdominal aortic atherosclerosis without aneurysm. Right pericardial phrenic lymph node seen on image 1/series 2 today is stable since prior. Reproductive: The uterus is surgically  absent. There is no adnexal mass. Other: No intraperitoneal free fluid.Tiny 8 mm left omental nodule is seen on image 36/2. The 3 mm soft tissue nodule seen previously in the left paracolic gutter is not visible on today's study. Musculoskeletal: No worrisome lytic or sclerotic osseous abnormality. IMPRESSION: 1. No substantial interval change in exam. No definite findings to suggest recurrent/metastatic disease. 2. Right pericardial phrenic lymph node noted on multiple prior studies is unchanged in the interval. 3. 8 mm omental nodule identified on today's exam. Close attention on follow-up recommended. 3 mm soft tissue nodule along the left paracolic gutter seen previously has resolved in the interval. 4.  Aortic Atherosclerois (ICD10-170.0) Electronically Signed   By: ERandall Hiss  Tery Sanfilippo M.D.   On: 12/11/2018 13:45    All questions were answered. The patient knows to call the clinic with any problems, questions or concerns. No barriers to learning was detected.  I spent 15 minutes counseling the patient face to face. The total time spent in the appointment was 20 minutes and more than 50% was on counseling and review of test results  Heath Lark, MD 12/13/2018 1:23 PM

## 2018-12-13 NOTE — Assessment & Plan Note (Signed)
She has intermittent neuropathic pain likely due to previous side effects of treatment Given poor tolerance to medications, I recommend surveillance only and pain medicine as needed

## 2018-12-13 NOTE — Assessment & Plan Note (Signed)
Her blood pressure is stable She will continue close monitoring of blood pressure I will attempt to wean her off blood pressure medications in the future with discontinuation of bevacizumab

## 2018-12-17 ENCOUNTER — Telehealth: Payer: Self-pay | Admitting: Hematology and Oncology

## 2018-12-17 ENCOUNTER — Other Ambulatory Visit: Payer: Self-pay | Admitting: Cardiovascular Disease

## 2018-12-17 ENCOUNTER — Telehealth: Payer: Self-pay

## 2018-12-17 NOTE — Telephone Encounter (Signed)
She called and left a message to call her.   Called back. She is having redness and puffiness to hands, symptoms have been going on for 3 weeks. In the right hand/arm she complaining of tingling that is driving her crazy. She is complaining of facial puffiness and swelling to lower legs also. She is going to take tylenol for pain. Told her the office will call back in the am. No appts scheduled.

## 2018-12-17 NOTE — Telephone Encounter (Signed)
Scheduled appt per 1/23 sch message - pt is aware of appt date and time  

## 2018-12-17 NOTE — Telephone Encounter (Addendum)
PATIENT HAS NO MEDICATION REMAINING    *STAT* If patient is at the pharmacy, call can be transferred to refill team.   1. Which medications need to be refilled? (please list name of each medication and dose if known) rivaroxaban (XARELTO) 20 MG TABS tablet  2. Which pharmacy/location (including street and city if local pharmacy) is medication to be sent to? [place]  3. Do they need a 30 day or 90 day supply? [place]

## 2018-12-18 MED ORDER — RIVAROXABAN 20 MG PO TABS: 20.0000 mg | ORAL_TABLET | Freq: Every day | ORAL | 0 refills | Status: DC

## 2018-12-18 NOTE — Telephone Encounter (Signed)
Discussed the response below to patient's concerns on Monday. Patient states she understands to limit her salt intake. She states she believes a pill would help her better than propping up her foot. Patient advised a diuretic would decrease her sodium too much. She states she understands but is not happy about it. Patient understands to call this office with any further questions.

## 2018-12-18 NOTE — Telephone Encounter (Signed)
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 Limit salt intake. I do not recommend diuretics because she has low sodium in her last lab draw

## 2018-12-19 ENCOUNTER — Telehealth: Payer: Self-pay | Admitting: Oncology

## 2018-12-19 NOTE — Telephone Encounter (Signed)
Haley [name] called and said she has been having nose bleeds on and off for the past 2 weeks.  She said this morning her left nostril started "spotting blood" and has been doing this on an off through out the day.    She is wondering if it is caused by high blood pressure.  She said her bp has been running high the past few days: 12/7 - 166/86, 12/8 161/85 and today 160/74.  Discussed that Dr. Alvy Bimler will be notified in the morning and that she should go to the ER during the night if the bleeding increases.

## 2018-12-20 NOTE — Telephone Encounter (Signed)
I recommend increase amlodipine to 10 mg We will call her next week to follow on BP control Also, keep nostril moist with vaseline

## 2018-12-20 NOTE — Telephone Encounter (Signed)
Called Didi back and advised her of message from Dr. Alvy Bimler.  She verbalized understanding and agreement.

## 2018-12-21 ENCOUNTER — Ambulatory Visit
Admission: RE | Admit: 2018-12-21 | Discharge: 2018-12-21 | Disposition: A | Discharge status: Home / Self Care | Payer: PPO | Source: Ambulatory Visit | Attending: Family Medicine | Admitting: Family Medicine

## 2018-12-21 ENCOUNTER — Other Ambulatory Visit: Payer: Self-pay

## 2018-12-21 DIAGNOSIS — Z1231 Encounter for screening mammogram for malignant neoplasm of breast: Secondary | ICD-10-CM

## 2018-12-26 ENCOUNTER — Telehealth: Payer: Self-pay

## 2018-12-26 NOTE — Telephone Encounter (Signed)
I suggest adding spironolactone back, take in the morning

## 2018-12-26 NOTE — Telephone Encounter (Signed)
Called and given below message. She verbalized understanding. 12/10 am bp-157/79 pulse 58 and pm 154/88 and pulse 74 12/11 did not check 12/12 am 150/85 12/13 did not check 12/14 am 151/79 and pulse 58 12/15 am 143/73 and pulse 58 Today 153/76 and pulse 64  She has Norvasc and Toprol that she is taking. She has Spironolactone that she is not taking.  She had a small nose blood yesterday.  Instructed to keep nostrils moisturized as previously instructed. She verbalized understanding. She has been keeping her nostrils moisturized.

## 2018-12-26 NOTE — Telephone Encounter (Signed)
-----   Message from Heath Lark, MD sent at 12/26/2018  8:13 AM EST ----- Regarding: can you ask how her BP is doing and what medications she has at home?

## 2018-12-26 NOTE — Telephone Encounter (Signed)
Called and given below message. She verbalized understanding. She does not want to start the Spironolactone because she is feeling so good and she wants to feel good at christmas. She is worried about her gums becoming irritated again. She is trying to get a new partial made and if her gums are irritated she will be unable to get a new partial. She has dropped her bp cuff several times and she is worried that it is not giving accurate readings. She will get a new cuff and check bp am and pm. Instructed to call the office if needed.

## 2018-12-28 ENCOUNTER — Encounter: Payer: Self-pay | Admitting: Family Medicine

## 2018-12-31 ENCOUNTER — Telehealth: Payer: Self-pay

## 2018-12-31 NOTE — Telephone Encounter (Signed)
Called back to follow up on blood pressure readings.  12/17 bp am 155/87 pulse 63 12/18 she did not check 12/19 bp 130/71 and pulse 70, not sure if am or pm 12/20 bp am 151/81 and pulse 70 and pm bp 125/70 and pulse in 70's 12/21 bp am 136/78 and pulse 59, and now 126/74 and pulse 59  Reminded her to weigh herself daily at the same time and write it down. She weighed herself and was 142 lbs while on the phone.  Instructed to call the office if needed. She verbalized understanding.

## 2018-12-31 NOTE — Telephone Encounter (Signed)
Called and left a message asking her to call the office. Asking what her blood pressure is and reminding her to weigh herself.

## 2019-01-12 ENCOUNTER — Encounter: Payer: Self-pay | Admitting: Hematology and Oncology

## 2019-01-28 ENCOUNTER — Other Ambulatory Visit: Payer: Self-pay

## 2019-01-28 ENCOUNTER — Inpatient Hospital Stay: Payer: PPO | Attending: Obstetrics | Admitting: Hematology and Oncology

## 2019-01-28 ENCOUNTER — Inpatient Hospital Stay: Payer: PPO

## 2019-01-28 VITALS — BP 126/66 | HR 76 | Temp 97.7°F | Resp 18 | Ht 62.0 in | Wt 144.2 lb

## 2019-01-28 DIAGNOSIS — N952 Postmenopausal atrophic vaginitis: Secondary | ICD-10-CM | POA: Diagnosis not present

## 2019-01-28 DIAGNOSIS — N760 Acute vaginitis: Secondary | ICD-10-CM

## 2019-01-28 DIAGNOSIS — I10 Essential (primary) hypertension: Secondary | ICD-10-CM | POA: Diagnosis not present

## 2019-01-28 DIAGNOSIS — T451X5A Adverse effect of antineoplastic and immunosuppressive drugs, initial encounter: Secondary | ICD-10-CM

## 2019-01-28 DIAGNOSIS — C561 Malignant neoplasm of right ovary: Secondary | ICD-10-CM

## 2019-01-28 DIAGNOSIS — N3941 Urge incontinence: Secondary | ICD-10-CM

## 2019-01-28 DIAGNOSIS — G620 Drug-induced polyneuropathy: Secondary | ICD-10-CM | POA: Insufficient documentation

## 2019-01-28 DIAGNOSIS — R300 Dysuria: Secondary | ICD-10-CM | POA: Insufficient documentation

## 2019-01-28 DIAGNOSIS — C569 Malignant neoplasm of unspecified ovary: Secondary | ICD-10-CM

## 2019-01-28 DIAGNOSIS — M79606 Pain in leg, unspecified: Secondary | ICD-10-CM

## 2019-01-28 LAB — CMP (CANCER CENTER ONLY)
ALT: 10 U/L (ref 0–44)
AST: 14 U/L — ABNORMAL LOW (ref 15–41)
Albumin: 3.6 g/dL (ref 3.5–5.0)
Alkaline Phosphatase: 74 U/L (ref 38–126)
Anion gap: 8 (ref 5–15)
BUN: 9 mg/dL (ref 8–23)
CO2: 26 mmol/L (ref 22–32)
Calcium: 8.9 mg/dL (ref 8.9–10.3)
Chloride: 107 mmol/L (ref 98–111)
Creatinine: 0.75 mg/dL (ref 0.44–1.00)
GFR, Est AFR Am: >60 mL/min (ref >60)
GFR, Estimated: >60 mL/min (ref >60)
Glucose, Bld: 72 mg/dL (ref 70–99)
Potassium: 3.9 mmol/L (ref 3.5–5.1)
Sodium: 141 mmol/L (ref 135–145)
Total Bilirubin: 0.4 mg/dL (ref 0.3–1.2)
Total Protein: 6.8 g/dL (ref 6.5–8.1)

## 2019-01-28 LAB — CBC WITH DIFFERENTIAL (CANCER CENTER ONLY)
Abs Immature Granulocytes: 0.03 10*3/uL (ref 0.00–0.07)
Basophils Absolute: 0.1 10*3/uL (ref 0.0–0.1)
Basophils Relative: 1 %
Eosinophils Absolute: 0.8 10*3/uL — ABNORMAL HIGH (ref 0.0–0.5)
Eosinophils Relative: 9 %
HCT: 36.5 % (ref 36.0–46.0)
Hemoglobin: 11.8 g/dL — ABNORMAL LOW (ref 12.0–15.0)
Immature Granulocytes: 0 %
Lymphocytes Relative: 25 %
Lymphs Abs: 2 10*3/uL (ref 0.7–4.0)
MCH: 31.3 pg (ref 26.0–34.0)
MCHC: 32.3 g/dL (ref 30.0–36.0)
MCV: 96.8 fL (ref 80.0–100.0)
Monocytes Absolute: 0.7 10*3/uL (ref 0.1–1.0)
Monocytes Relative: 8 %
Neutro Abs: 4.5 10*3/uL (ref 1.7–7.7)
Neutrophils Relative %: 57 %
Platelet Count: 146 10*3/uL — ABNORMAL LOW (ref 150–400)
RBC: 3.77 MIL/uL — ABNORMAL LOW (ref 3.87–5.11)
RDW: 14.2 % (ref 11.5–15.5)
WBC Count: 8.1 10*3/uL (ref 4.0–10.5)
nRBC: 0 % (ref 0.0–0.2)

## 2019-01-28 LAB — URINALYSIS, COMPLETE (UACMP) WITH MICROSCOPIC
Bilirubin Urine: NEGATIVE
Glucose, UA: NEGATIVE mg/dL
Hgb urine dipstick: NEGATIVE
Ketones, ur: NEGATIVE mg/dL
Leukocytes,Ua: NEGATIVE
Nitrite: NEGATIVE
Protein, ur: NEGATIVE mg/dL
Specific Gravity, Urine: 1.004 — ABNORMAL LOW (ref 1.005–1.030)
pH: 6 (ref 5.0–8.0)

## 2019-01-28 MED ORDER — ESTRADIOL 0.1 MG/GM VA CREA: 1.0000 | TOPICAL_CREAM | VAGINAL | 12 refills | Status: DC

## 2019-01-28 MED ORDER — SODIUM CHLORIDE 0.9% FLUSH
10.0000 mL | Freq: Once | INTRAVENOUS | Status: AC
  Administered 2019-01-28: 10 mL
  Filled 2019-01-28: qty 10

## 2019-01-28 MED ORDER — HEPARIN SOD (PORK) LOCK FLUSH 100 UNIT/ML IV SOLN
500.0000 [IU] | Freq: Once | INTRAVENOUS | Status: AC
  Administered 2019-01-28: 500 [IU]
  Filled 2019-01-28: qty 5

## 2019-01-28 MED ORDER — HYDROMORPHONE HCL 2 MG PO TABS: 2.0000 mg | ORAL_TABLET | ORAL | 0 refills | Status: DC | PRN

## 2019-01-28 MED ORDER — GABAPENTIN 300 MG PO CAPS: 300.0000 mg | ORAL_CAPSULE | Freq: Two times a day (BID) | ORAL | 11 refills | Status: DC

## 2019-01-29 ENCOUNTER — Telehealth: Payer: Self-pay

## 2019-01-29 ENCOUNTER — Telehealth: Payer: Self-pay | Admitting: Oncology

## 2019-01-29 ENCOUNTER — Other Ambulatory Visit: Payer: Self-pay

## 2019-01-29 LAB — URINE CULTURE: Culture: NO GROWTH

## 2019-01-29 MED ORDER — MAGIC MOUTHWASH W/LIDOCAINE: 5.0000 mL | Freq: Four times a day (QID) | ORAL | 0 refills | Status: AC

## 2019-01-29 NOTE — Telephone Encounter (Signed)
-----   Message from Heath Lark, MD sent at 01/29/2019 11:18 AM EST ----- Regarding: pls let her know urine test is negative  ----- Message ----- From: Interface, Lab In [facility] Sent: 01/28/2019  11:07 AM EST To: Heath Lark, MD

## 2019-01-29 NOTE — Telephone Encounter (Signed)
Renee Intriago that she can use semi-permanent hair color and highlights.  She verbalized understanding and then said her left jaw and lips are sore and swollen.  She said it started yesterday with her lips.  She denies having any blisters in her mouth. She is wondering if it is from the whitening tooth paste she has been using.  She is going to try salt water rinses today and also is wondering if she can get a refill on magic mouthwash.  She would like it sent to [facility] in [place].

## 2019-01-29 NOTE — Telephone Encounter (Signed)
Notified Yukiko that refill will be called in to [facility] in [place].

## 2019-01-29 NOTE — Telephone Encounter (Signed)
OK to send magic mouth wash. Some pharmacist may not know how to mix it Swish and sawllow 4 x per day for 7 days

## 2019-01-29 NOTE — Telephone Encounter (Signed)
Called and given below message. She verbalized understanding.  She is wanting to clarify how to use estrace vaginal cream. Rx states 1 applicator full 3 x week. Is she supposed to use vaginally or external. She cannot remember. Her co pay was $45.00 and it is a tiny tube. She thinks one tube will last 1 week and she will not be able to afford the Rx. She was using a OTC cream that was only $12.00.

## 2019-01-30 ENCOUNTER — Encounter: Payer: Self-pay | Admitting: Hematology and Oncology

## 2019-01-30 ENCOUNTER — Other Ambulatory Visit: Payer: Self-pay | Admitting: *Deleted

## 2019-01-30 ENCOUNTER — Other Ambulatory Visit: Payer: Self-pay | Admitting: Hematology and Oncology

## 2019-01-30 DIAGNOSIS — N760 Acute vaginitis: Secondary | ICD-10-CM

## 2019-01-30 MED ORDER — ESTRADIOL 0.1 MG/GM VA CREA: 1.0000 | TOPICAL_CREAM | VAGINAL | 12 refills | Status: AC

## 2019-01-30 NOTE — Assessment & Plan Note (Signed)
Her blood pressure continues to trend down since discontinuation of treatment I recommend discontinuation of amlodipine I am hopeful we can eventually taper her off all prescription antihypertensives if possible

## 2019-01-30 NOTE — Progress Notes (Signed)
[place] OFFICE PROGRESS NOTE  Patient Care Team: Martinique, Camyah G, MD as PCP - General (Family Medicine) Troy Sine, MD as PCP - Cardiology (Cardiology) Awanda Mink Craige Cotta, RN as Oncology Nurse Navigator (Oncology)  ASSESSMENT & PLAN:  Right ovarian epithelial cancer [facility]) She is currently asymptomatic In fact, she felt better since we discontinue treatment She continues to asked similar questions as before, in terms of the significance of elevated CA-125 Even though her tumor marker is elevated, repeat CT imaging show no evidence of disease At this point, I recommend we follow her clinically and to only order CT imaging if she have new signs and symptoms of abdominal pain, bloating, changes in bowel habits or nausea  Peripheral neuropathy due to chemotherapy [facility]) She have symptoms of peripheral neuropathy She is blaming on spironolactone as a cause of her abnormal sensation I told her this is likely due to residual side effects from prior treatment, exacerbated by recent cold weather I recommend trial of gabapentin and to continue to take pain medicine as needed  Essential hypertension, benign Her blood pressure continues to trend down since discontinuation of treatment I recommend discontinuation of amlodipine I am hopeful we can eventually taper her off all prescription antihypertensives if possible  Dysuria She have symptoms of dysuria which I suspect could be due to vaginitis/vaginal atrophy I have ordered a urinalysis and urine culture that came back negative I recommend topical estrogen cream   Orders Placed This Encounter  Procedures  . Urine Culture    Standing Status:   Future    Number of Occurrences:   1    Standing Expiration Date:   03/03/2020  . Urinalysis, Complete w Microscopic    Standing Status:   Future    Number of Occurrences:   1    Standing Expiration Date:   01/28/2020    All questions were answered. The patient knows to call the  clinic with any problems, questions or concerns. The total time spent in the appointment was 30 minutes encounter with patients including review of chart and various tests results, discussions about plan of care and coordination of care plan   Heath Lark, MD 01/30/2019 8:18 AM  INTERVAL HISTORY: Please see below for problem oriented charting. She returns for further follow-up She had multiple symptoms/complaints today despite discontinuation of treatment First of all, her blood pressure monitoring at home is borderline low/normal This is likely related to treatment side effects from bevacizumab washing out since we discontinue treatment Her appetite is fair and she have not lost any weight She denies abdominal pain, nausea, changes in bowel habits or bloating She still had questions about the significance of her tumor marker She complained of sensation of numbness especially near her elbow. She is wondering whether this could be due to side effects of spironolactone She has been doing extensive research in the Internet about side effects of treatment She is also wondering about the Covid vaccine She complained of vaginal discomfort/dysuria when she urinates Denies abnormal vaginal bleeding  SUMMARY OF ONCOLOGIC HISTORY: Oncology History Overview Note  Neg for genetics blood work and HRD   Right ovarian epithelial cancer ([place])  01/06/2018 Imaging   Ct abdomen and pelvis 1. Complex cystic mass at the right adnexa, measuring 5.9 x 4.0 cm, with nodular components, concerning for primary ovarian malignancy. 2. Diffuse nodularity along the omentum at the left side of the abdomen, extending into the mesentery at the left mid abdomen, concerning for  peritoneal carcinomatosis. 3. Wall thickening at the distal ileum adjacent to the ovarian mass; bowel loops appear somewhat adherent to the ovarian mass. Bowel infiltration with tumor cannot be excluded. No evidence of bowel obstruction at this  time. 4. Small volume ascites within the abdomen and pelvis.  Aortic Atherosclerosis (ICD10-I70.0).   01/08/2018 Tumor Marker   Patient's tumor was tested for the following markers: CA-125. Results of the tumor marker test revealed 3004   01/15/2018 Imaging   Chest CT:  1. No active cardiopulmonary disease. 2. Aortic atherosclerosis without aneurysm or dissection. 3. No large central pulmonary embolus.  CT AP:  1. Dilated fluid-filled loops of small bowel are redemonstrated slightly more extensive than on prior exam with transition point likely in the right adnexa adjacent to a complex cystic mass concerning for ovarian neoplasm given septations and soft tissue nodularity. This raises concern for early or partial SBO. This soft tissue mass measures 4.9 x 4 x 4.7 cm and has not changed since prior recent comparison. Additional short segmental area of luminal narrowing is noted in the right lower quadrant involving small bowel for which stigmata of peritoneal carcinomatosis or small-bowel metastatic implants might account for this. 2. Redemonstration of small volume of ascites predominantly in the upper abdomen surrounding the liver and spleen. 3. Redemonstration of thick bandlike omental thickening concerning for peritoneal carcinomatosis.    01/15/2018 Procedure   Successful ultrasound-guided diagnostic and therapeutic paracentesis yielding 1.5 liters of peritoneal fluid.    01/15/2018 Pathology Results   PERITONEAL/ASCITIC FLUID (SPECIMEN 1 OF 1 COLLECTED 01/18/18): MALIGNANT CELLS CONSISTENT WITH METASTATIC ADENOCARCINOMA. SEE COMMENT. COMMENT: THE MALIGNANT CELLS ARE POSITIVE FOR MOC-31, CYTOKERATIN 7, ESTROGEN RECEPTOR, PAX-8, AND WT-1. THEY ARE NEGATIVE FOR CALRETININ, CYTOKERATIN 5/6, AND CYTOKERATIN 20. THE PROFILE IS CONSISTENT WITH A PRIMARY GYNECOLOGIC CARCINOMA. THERE IS LIKELY SUFFICIENT TUMOR PRESENT, IF ADDITIONAL STUDIES ARE REQUESTED.   01/15/2018 - 02/02/2018 Hospital  Admission   She was admitted to the hospital for SBO. She was treated with chemotherapy   01/18/2018 - 06/20/2018 Chemotherapy   The patient had carboplatin and taxol   02/08/2018 Cancer Staging   Staging form: Ovary, Fallopian Tube, and Primary Peritoneal Carcinoma, AJCC 8th Edition - Clinical: cT3, cN0, cM0 - Signed by Heath Lark, MD on 02/08/2018   02/08/2018 Tumor Marker   Patient's tumor was tested for the following markers: CA-125. Results of the tumor marker test revealed 445   03/02/2018 Tumor Marker   Patient's tumor was tested for the following markers: CA-125. Results of the tumor marker test revealed 174   03/15/2018 Imaging   6.2 cm complex cystic right ovarian mass, compatible with malignant ovarian neoplasm, mildly progressive.  Associated peritoneal disease/omental caking beneath the anterior abdominal wall, mildly improved. Prior abdominal ascites is improved/resolved.  4.7 cm cystic left ovarian mass, without overt malignant features, grossly unchanged.  Mild bilateral hydroureteronephrosis, secondary to extrinsic compression, new.  Prior small bowel obstruction has improved/resolved.    03/27/2018 Pathology Results   1. Omentum, resection for tumor - OMENTUM: - HIGH GRADE SEROUS CARCINOMA. 2. Ovary, left - LEFT OVARY: - HIGH GRADE SEROUS CARCINOMA. - LEFT FALLOPIAN TUBE: - HIGH GRADE SEROUS CARCINOMA. 3. Adnexa - ovary +/- tube, neoplastic, right - RIGHT OVARY: - HIGH GRADE SEROUS CARCINOMA, 5.5 CM. - RIGHT FALLOPIAN TUBE: - HIGH GRADE SEROUS CARCINOMA. 4. Peritoneum, biopsy, nodule - PERITONEUM: - NODULES OF HIGH GRADE SEROUS CARCINOMA. 5. Peritoneum, resection for tumor, rectosigmoid - RECTOSIGMOID PERITONEUM: - HIGH GRADE SEROUS CARCINOMA. Microscopic Comment  3. OVARY or FALLOPIAN TUBE or PRIMARY PERITONEUM: Procedure: Bilateral salpingo-oophorectomy, omentectomy and peritoneal biopsies. Specimen Integrity: N/A. Tumor Site: Right ovary. Ovarian  Surface Involvement (required only if applicable): Yes. Fallopian Tube Surface Involvement (required only if applicable): Yes. Tumor Size: 5.5 x 4.8 x 4.0 cm. Histologic Type: Serous carcinoma. Histologic Grade: High grade. Implants (required for advanced stage serous/seromucinous borderline tumors only): Omentum, left ovary and fallopian tube, rectosigmoid peritoneum and peritoneum. Other Tissue/ Organ Involvement: Left ovary and fallopian tube, omentum and rectosigmoid peritoneum. Largest Extrapelvic Peritoneal Focus (required only if applicable): 39.7 cm, omentum. Peritoneal/Ascitic Fluid: N/A. Treatment Effect (required only for high-grade serous carcinomas): Minimal. Regional Lymph Nodes: No lymph nodes submitted. Pathologic Stage Classification (pTNM, AJCC 8th Edition): pT3c, pNX. Representative Tumor Block: 3A, 3B, 3D, 3E and 80F. Comment(s): There is a 5.5 cm in greatest dimension partially cystic high grade serous carcinoma involving the right adnexal specimen and the tumor is staged as a primary right ovarian carcinoma. The carcinoma also involves the left ovary as well as the left fallopian tube, the omentum, peritoneum and the rectosigmoid peritoneal specimens.   03/27/2018 Surgery   Preoperative Diagnosis: ovarian cancer, stage IIIC, metastatic to omentum, peritoneum, serosa of intestines   Procedure(s) Performed: 1. Exploratory laparotomy with bilateral salpingo-oophorectomy, omentectomy radical tumor debulking for ovarian cancer .  Surgeon: Thereasa Solo, MD.   Specimens: Bilateral tubes / ovaries, omentum. Peritoneal nodules, rectosigmoid nodules.    Operative Findings: omental cake, miliary studding on diaphragm and bilateral paracolic gutters. Sigmoid colon densely adherent to left and right ovaries with tumor rind, ureters mildly dilated bilaterally due to retroperitoneal extension of the tumor towards ureters causing compression.    This represented an optimal  cytoreduction (R1) with gross residual disease on the diaphragm that had been ablated and a thin tumor plaque on the sigmoid colon that was ablated. No residual disease >1cm remaining   05/07/2018 Genetic Testing   Negative genetic testing on the [employer] TumorNextHRD+ CancerNext Panel. The CancerNext gene panel offered by [employer] includes sequencing and rearrangement analysis for the following 34 genes:   APC, ATM, BARD1, BMPR1A, BRCA1, BRCA2, BRIP1, CDH1, CDK4, CDKN2A, CHEK2, DICER1, EPCAM, GREM1, HOXB13, MLH1, MRE11A, MSH2, MSH6, MUTYH, NBN, NF1, PALB2, PMS2, POLD1, POLE, PTEN, RAD50, RAD51C, RAD51D, SMAD4, SMARCA4, STK11, and TP53. Somatic genes analyzed through TumorNext-HRD: ATM, BARD1, BRCA1, BRCA2, BRIP1, CHEK2, MRE11A, NBN, PALB2, RAD51C, RAD51D. The report date is 05/07/2018.   05/11/2018 Tumor Marker   Patient's tumor was tested for the following markers: CA-125. Results of the tumor marker test revealed 60.5   07/02/2018 Imaging   1. Mild right pericardiophrenic adenopathy is mildly increased, suspicious for metastatic nodes. No abdominopelvic adenopathy. 2. Small left peritoneal soft tissue nodule adjacent to the splenic flexure and smooth left pelvic sidewall peritoneal thickening, cannot exclude residual/recurrent disease. Attention on follow-up CT advised. 3. Bilateral renal collecting system dilatation has improved. 4.  Aortic Atherosclerosis (ICD10-I70.0).   07/02/2018 Tumor Marker   Patient's tumor was tested for the following markers: CA-125 Results of the tumor marker test revealed 42.7   07/11/2018 Echocardiogram   1. The left ventricle has normal systolic function with an ejection fraction of 60-65%. The cavity size was normal. Left ventricular diastolic parameters were normal.  2. Normal GLS -20.1.  3. The right ventricle has normal systolic function. The cavity was normal. There is no increase in right ventricular wall thickness.  4. The mitral valve is degenerative.  Moderate thickening of the mitral valve leaflet. Moderate  calcification of the mitral valve leaflet.  5. The aortic valve is tricuspid. Moderate thickening of the aortic valve. Moderate calcification of the aortic valve. Aortic valve regurgitation is trivial by color flow Doppler.  6. The aortic root is normal in size and structure.    Chemotherapy   The patient had doxorubicin and bevacizumab for chemotherapy treatment.     07/30/2018 Tumor Marker   Patient's tumor was tested for the following markers: CA-125 Results of the tumor marker test revealed 82.6   08/27/2018 Tumor Marker   Patient's tumor was tested for the following markers: CA-125 Results of the tumor marker test revealed 111.   09/24/2018 Tumor Marker   Patient's tumor was tested for the following markers: CA-125 Results of the tumor marker test revealed 142.   10/05/2018 Imaging   1. Stable right juxta diaphragmatic/pericardiophrenic lymph node, mildly enlarged. 2. Otherwise no definite metastatic disease identified in the abdomen/pelvis. 3. 3 mm peritoneal nodule noted left paracolic gutter on today's study, indeterminate. Attention on follow-up recommended. 4.  Aortic Atherosclerois (ICD10-170.0)   10/16/2018 Echocardiogram    1. No significant change from prior study (07/11/2018).  2. Left ventricular ejection fraction, by visual estimation, is 60 to 65%. The left ventricle has normal function. Normal left ventricular size. There is no left ventricular hypertrophy.  3. LVEF by 3D assessment 63%.  4. The average left ventricular global longitudinal strain is -22.4 %.  5. Global right ventricle has normal systolic function.The right ventricular size is normal. No increase in right ventricular wall thickness.  6. Left atrial size was normal.  7. Right atrial size was normal.  8. Presence of pericardial fat pad.  9. Moderate thickening of the mitral valve leaflet(s). 10. The mitral valve is normal in structure. Trace mitral  valve regurgitation. 11. The tricuspid valve is normal in structure. Tricuspid valve regurgitation is mild. 12. Aortic regurgitation PHT measures 467 msec. 13. The aortic valve is tricuspid Aortic valve regurgitation is mild by color flow Doppler. Mild aortic valve sclerosis without stenosis. 14. There is mild to moderate calcification of the AoV, with focal calcification of the [place]. The aortic regurgitation is mild in severity, likely related to degenerative valve disease. 15. The pulmonic valve was grossly normal. Pulmonic valve regurgitation is not visualized by color flow Doppler. 16. Mild plaque invoving the ascending aorta. 17. Normal pulmonary artery systolic pressure. 18. The tricuspid regurgitant velocity is 2.52 m/s, and with an assumed right atrial pressure of 3 mmHg, the estimated right ventricular systolic pressure is normal at 28.4 mmHg.   10/26/2018 Tumor Marker   Patient's tumor was tested for the following markers: CA-125 Results of the tumor marker test revealed 196   12/11/2018 Imaging   1. No substantial interval change in exam. No definite findings to suggest recurrent/metastatic disease. 2. Right pericardial phrenic lymph node noted on multiple prior studies is unchanged in the interval. 3. 8 mm omental nodule identified on today's exam. Close attention on follow-up recommended. 3 mm soft tissue nodule along the left paracolic gutter seen previously has resolved in the interval. 4.  Aortic Atherosclerois (ICD10-170.0)     REVIEW OF SYSTEMS:   Constitutional: Denies fevers, chills or abnormal weight loss Eyes: Denies blurriness of vision Ears, nose, mouth, throat, and face: Denies mucositis or sore throat Respiratory: Denies cough, dyspnea or wheezes Cardiovascular: Denies palpitation, chest discomfort or lower extremity swelling Gastrointestinal:  Denies nausea, heartburn or change in bowel habits Skin: Denies abnormal skin rashes Lymphatics: Denies new  lymphadenopathy or easy bruising Behavioral/Psych: Mood is stable, no new changes  All other systems were reviewed with the patient and are negative.  I have reviewed the past medical history, past surgical history, social history and family history with the patient and they are unchanged from previous note.  ALLERGIES:  is allergic to tramadol hcl.  MEDICATIONS:  Current Outpatient Medications  Medication Sig Dispense Refill  . acetaminophen (TYLENOL) 500 MG tablet Take 2 tablets (1,000 mg total) by mouth every 6 (six) hours. (Patient taking differently: Take 1,000 mg by mouth every 6 (six) hours as needed. ) 30 tablet 0  . ALPRAZolam (XANAX) 0.5 MG tablet Take 0.5-1 tablets (0.25-0.5 mg total) by mouth daily as needed for anxiety. 30 tablet 2  . alum & mag hydroxide-simeth (MAALOX/MYLANTA) 200-200-20 MG/5ML suspension Take 30 mLs by mouth every 6 (six) hours as needed for indigestion or heartburn. 355 mL 0  . Calcium Carbonate (CALTRATE 600) 1500 MG TABS Take 600 mg of elemental calcium by mouth daily.     . cholecalciferol (VITAMIN D) 1000 UNITS tablet Take 1,000 Units by mouth daily.     Marland Kitchen estradiol (ESTRACE) 0.1 MG/GM vaginal cream Place 1 Applicatorful vaginally 3 (three) times a week. 42.5 g 12  . fluticasone (FLONASE) 50 MCG/ACT nasal spray Use 2 sprays in each nostril daily (Patient taking differently: Place 2 sprays into both nostrils daily. ) 48 g 2  . gabapentin (NEURONTIN) 300 MG capsule Take 1 capsule (300 mg total) by mouth 2 (two) times daily. 60 capsule 11  . HYDROmorphone (DILAUDID) 2 MG tablet Take 1 tablet (2 mg total) by mouth every 4 (four) hours as needed for severe pain. 30 tablet 0  . ibuprofen (ADVIL,MOTRIN) 200 MG tablet Take 600 mg by mouth every 6 (six) hours as needed for moderate pain.     Marland Kitchen lactose free nutrition (BOOST PLUS) LIQD Take 237 mLs by mouth daily. (Patient taking differently: Take 237 mLs by mouth 2 (two) times daily between meals. ) 10 Can 0  .  levothyroxine (SYNTHROID) 75 MCG tablet Take 1 tablet (75 mcg total) by mouth daily. 90 tablet 3  . lidocaine (XYLOCAINE) 2 % solution     . lidocaine-prilocaine (EMLA) cream Apply to affected area once 30 g 3  . magic mouthwash w/lidocaine SOLN Take 5 mLs by mouth 4 (four) times daily for 7 days. 140 mL 0  . Magnesium 400 MG TABS Take 250 mg by mouth daily.    . Menthol-Methyl Salicylate (MUSCLE RUB) 10-15 % CREA Apply 1 application topically as needed for muscle pain.    . metoprolol succinate (TOPROL-XL) 50 MG 24 hr tablet Take 1 tablet (50 mg total) by mouth daily. Take with or immediately following a meal. 90 tablet 3  . Multiple Vitamins-Minerals (MULTIVITAMIN WITH MINERALS) tablet Take 1 tablet by mouth daily.      . Omega-3 Fatty Acids (FISH OIL) 1000 MG CAPS Take 2,000 mg by mouth 2 (two) times daily.     Marland Kitchen omeprazole (PRILOSEC) 40 MG capsule Take 1 capsule (40 mg total) by mouth daily. 90 capsule 3  . ondansetron (ZOFRAN) 8 MG tablet Take 1 tablet (8 mg total) by mouth every 8 (eight) hours as needed (Nausea or vomiting). 30 tablet 1  . polyethylene glycol (MIRALAX / GLYCOLAX) packet Take 17 g by mouth daily. 14 each 0  . prochlorperazine (COMPAZINE) 10 MG tablet Take 1 tablet (10 mg total) by mouth every 6 (six) hours as needed (Nausea  or vomiting). 30 tablet 1  . rivaroxaban (XARELTO) 20 MG TABS tablet Take 1 tablet (20 mg total) by mouth daily with supper. 30 tablet 0  . senna (SENOKOT) 8.6 MG TABS tablet Take 1 tablet (8.6 mg total) by mouth at bedtime as needed for mild constipation. 120 each 0  . simvastatin (ZOCOR) 20 MG tablet Take 1 tablet (20 mg total) by mouth at bedtime. 90 tablet 1  . vitamin C (ASCORBIC ACID) 500 MG tablet Take 1,000 mg by mouth daily.     No current facility-administered medications for this visit.    PHYSICAL EXAMINATION: ECOG PERFORMANCE STATUS: 1 - Symptomatic but completely ambulatory  Vitals:   01/28/19 1138  BP: 126/66  Pulse: 76  Resp: 18   Temp: 97.7 F (36.5 C)  SpO2: 98%   Filed Weights   01/28/19 1138  Weight: 144 lb 3.2 oz (65.4 kg)    GENERAL:alert, no distress and comfortable NEURO: alert & oriented x 3 with fluent speech, no focal motor/sensory deficits  LABORATORY DATA:  I have reviewed the data as listed    Component Value Date/Time   NA 141 01/28/2019 1056   K 3.9 01/28/2019 1056   CL 107 01/28/2019 1056   CO2 26 01/28/2019 1056   GLUCOSE 72 01/28/2019 1056   BUN 9 01/28/2019 1056   CREATININE 0.75 01/28/2019 1056   CREATININE 0.76 11/17/2015 1005   CALCIUM 8.9 01/28/2019 1056   PROT 6.8 01/28/2019 1056   ALBUMIN 3.6 01/28/2019 1056   AST 14 (L) 01/28/2019 1056   ALT 10 01/28/2019 1056   ALKPHOS 74 01/28/2019 1056   BILITOT 0.4 01/28/2019 1056   GFRNONAA >60 01/28/2019 1056   GFRAA >60 01/28/2019 1056    No results found for: SPEP, UPEP  Lab Results  Component Value Date   WBC 8.1 01/28/2019   NEUTROABS 4.5 01/28/2019   HGB 11.8 (L) 01/28/2019   HCT 36.5 01/28/2019   MCV 96.8 01/28/2019   PLT 146 (L) 01/28/2019      Chemistry      Component Value Date/Time   NA 141 01/28/2019 1056   K 3.9 01/28/2019 1056   CL 107 01/28/2019 1056   CO2 26 01/28/2019 1056   BUN 9 01/28/2019 1056   CREATININE 0.75 01/28/2019 1056   CREATININE 0.76 11/17/2015 1005      Component Value Date/Time   CALCIUM 8.9 01/28/2019 1056   ALKPHOS 74 01/28/2019 1056   AST 14 (L) 01/28/2019 1056   ALT 10 01/28/2019 1056   BILITOT 0.4 01/28/2019 1056

## 2019-01-30 NOTE — Telephone Encounter (Signed)
Is the OTC cream contain estrogen?

## 2019-01-30 NOTE — Telephone Encounter (Signed)
Patient is questioning her need for this cream long term. She has used it once and feels fine. She will pick up refills at [facility]. The prescription will be cheaper there. Patient states she will call with any further concerns or questions.

## 2019-01-30 NOTE — Assessment & Plan Note (Signed)
She have symptoms of dysuria which I suspect could be due to vaginitis/vaginal atrophy I have ordered a urinalysis and urine culture that came back negative I recommend topical estrogen cream

## 2019-01-30 NOTE — Assessment & Plan Note (Signed)
She is currently asymptomatic In fact, she felt better since we discontinue treatment She continues to asked similar questions as before, in terms of the significance of elevated CA-125 Even though her tumor marker is elevated, repeat CT imaging show no evidence of disease At this point, I recommend we follow her clinically and to only order CT imaging if she have new signs and symptoms of abdominal pain, bloating, changes in bowel habits or nausea

## 2019-01-30 NOTE — Telephone Encounter (Signed)
The OTC cream does not contain estrogen.

## 2019-01-30 NOTE — Assessment & Plan Note (Signed)
She have symptoms of peripheral neuropathy She is blaming on spironolactone as a cause of her abnormal sensation I told her this is likely due to residual side effects from prior treatment, exacerbated by recent cold weather I recommend trial of gabapentin and to continue to take pain medicine as needed

## 2019-01-31 ENCOUNTER — Telehealth: Payer: Self-pay | Admitting: Oncology

## 2019-01-31 ENCOUNTER — Telehealth: Payer: Self-pay | Admitting: Hematology and Oncology

## 2019-01-31 NOTE — Telephone Encounter (Signed)
Scheduled per 1/20 sch msg. Called and left msg. Mailing printout

## 2019-01-31 NOTE — Telephone Encounter (Signed)
Haley [name] called and asked how long she will need to use the Estrace cream (she said the problem she was having is now cleared up) and if she needs to fill the entire applicator for each use.  She is concerned that the tubes will only last a week and it is $18.00 for each refill.  She is also asking if there is a pill she can take instead.

## 2019-01-31 NOTE — Telephone Encounter (Signed)
Called Giana back and advised her of message from Dr. Alvy Bimler.  She verbalized understanding and agreement in teach back mode.

## 2019-01-31 NOTE — Telephone Encounter (Signed)
I suggest three times a week for patients to reduce urinary symptoms She can just put a small amount (pea size) each time

## 2019-02-04 ENCOUNTER — Telehealth: Payer: Self-pay | Admitting: Oncology

## 2019-02-04 NOTE — Telephone Encounter (Signed)
Haley [name] said her lips are still sore/tight and swollen and it is painful to brush her teeth. She said it started back in October when she was taking spironolactone.  She is using lip balm "all the time" which helps a little but she is concerned because they are still sore.  She is wondering if it being caused by chemotherapy or an allergic reaction.    She also said her mouth is not sore now since she started using magic mouthwash.  Although her gums look pale to her and her tongue is red around the edges.  She also asked about the Covid vaccine.  She is concerned about her immune system being low and is wondering if she should wait awhile.

## 2019-02-05 NOTE — Telephone Encounter (Signed)
She told me a long story and blamed the spironolactone on her elbow pain as well She should not be experiencing any more side-effects from chemo or her medications since she is off for so long She needs to see her dentist to evaluate  If she is not comfortable to get the vaccine now she should wait

## 2019-02-05 NOTE — Telephone Encounter (Signed)
Called Haley [name] back and advised her of message from Dr. Alvy Bimler.  She is going to try finding a Pharmacist, community.

## 2019-02-06 ENCOUNTER — Other Ambulatory Visit: Payer: Self-pay

## 2019-02-08 ENCOUNTER — Ambulatory Visit (INDEPENDENT_AMBULATORY_CARE_PROVIDER_SITE_OTHER): Payer: PPO | Admitting: Family Medicine

## 2019-02-08 ENCOUNTER — Other Ambulatory Visit: Payer: Self-pay

## 2019-02-08 ENCOUNTER — Telehealth: Payer: PPO | Admitting: Family Medicine

## 2019-02-08 ENCOUNTER — Encounter: Payer: Self-pay | Admitting: Family Medicine

## 2019-02-08 VITALS — BP 128/70 | HR 63 | Resp 16 | Ht 62.0 in | Wt 146.0 lb

## 2019-02-08 DIAGNOSIS — B370 Candidal stomatitis: Secondary | ICD-10-CM

## 2019-02-08 DIAGNOSIS — C786 Secondary malignant neoplasm of retroperitoneum and peritoneum: Secondary | ICD-10-CM | POA: Diagnosis not present

## 2019-02-08 DIAGNOSIS — I480 Paroxysmal atrial fibrillation: Secondary | ICD-10-CM

## 2019-02-08 DIAGNOSIS — K1230 Oral mucositis (ulcerative), unspecified: Secondary | ICD-10-CM

## 2019-02-08 MED ORDER — NYSTATIN 100000 UNIT/ML MT SUSP: 5.0000 mL | Freq: Four times a day (QID) | OROMUCOSAL | 0 refills | Status: DC

## 2019-02-08 MED ORDER — SALIVASURE MT LOZG: 1.0000 | LOZENGE | OROMUCOSAL | 0 refills | Status: AC | PRN

## 2019-02-08 NOTE — Progress Notes (Signed)
ACUTE VISIT   HPI:  Chief Complaint  Patient presents with  . pale gums  . swollen lips  . tender inside mouth    [name] is a 74 y.o. female, who is here today complaining of above symptoms. She has had dey mouth for years but worse after chemo for ovarian cancer. She has not seen her dentist, according to pt,she cannot have dental cleaning due to chemo treatment. Pale and tender gums. Intermittent "sores" on lower lis and tongue. Pain is exacerbated by eating , worse with certain foods.  She has not identified exacerbating or alleviating factors. She has been prescribed topical lidocaine, which helps but she doe snot want to use medications for ever, she would like to find a definitive treatment.  She has tried mouth magic watch and Biotin among some.  She has some eye and vaginal dryness but she does not think this is related and does not want to provide details. No fever,chills,sore throat, dysphagia,or skin rash. She is using eye drops and follows with ophthalmologist.  Problem is stable.  Lab Results  Component Value Date   WBC 8.1 01/28/2019   HGB 11.8 (L) 01/28/2019   HCT 36.5 01/28/2019   MCV 96.8 01/28/2019   PLT 146 (L) 01/28/2019   She did some resource on line and found out that Amphotericine B can treat problem , she would like to try.  Started on Neurontin to treat neuropathic pain associated with chemo, mainly RUE. She is reporting some improvement. Follows with oncologist, right ovarian epithelial cancer.  HTN:  Hx of OSA, she is not on CPAP. She is on Metoprolol Succinate 50 mg daily. Atrial fib, following with Dr Claiborne Billings. Amlodipine was discontinued, BP has improved since she completed chemo. She is not checking BP at home.  Review of Systems  Constitutional: Negative for appetite change and fatigue.  HENT: Negative for congestion, nosebleeds, rhinorrhea and trouble swallowing.   Eyes: Negative for discharge and redness.    Respiratory: Negative for cough, shortness of breath and wheezing.   Cardiovascular: Negative for chest pain, palpitations and leg swelling.  Gastrointestinal: Negative for abdominal pain, nausea and vomiting.       No changes in bowel habits.  Musculoskeletal: Negative for gait problem and myalgias.  Neurological: Positive for numbness. Negative for weakness and headaches.  Rest see pertinent positives and negatives per HPI.   Current Outpatient Medications on File Prior to Visit  Medication Sig Dispense Refill  . acetaminophen (TYLENOL) 500 MG tablet Take 2 tablets (1,000 mg total) by mouth every 6 (six) hours. (Patient taking differently: Take 1,000 mg by mouth every 6 (six) hours as needed. ) 30 tablet 0  . ALPRAZolam (XANAX) 0.5 MG tablet Take 0.5-1 tablets (0.25-0.5 mg total) by mouth daily as needed for anxiety. 30 tablet 2  . alum & mag hydroxide-simeth (MAALOX/MYLANTA) 200-200-20 MG/5ML suspension Take 30 mLs by mouth every 6 (six) hours as needed for indigestion or heartburn. 355 mL 0  . Calcium Carbonate (CALTRATE 600) 1500 MG TABS Take 600 mg of elemental calcium by mouth daily.     . cholecalciferol (VITAMIN D) 1000 UNITS tablet Take 1,000 Units by mouth daily.     Marland Kitchen estradiol (ESTRACE) 0.1 MG/GM vaginal cream Place 1 Applicatorful vaginally 3 (three) times a week. 42.5 g 12  . fluticasone (FLONASE) 50 MCG/ACT nasal spray Use 2 sprays in each nostril daily (Patient taking differently: Place 2 sprays into both nostrils daily. ) 48  g 2  . gabapentin (NEURONTIN) 300 MG capsule Take 1 capsule (300 mg total) by mouth 2 (two) times daily. 60 capsule 11  . HYDROmorphone (DILAUDID) 2 MG tablet Take 1 tablet (2 mg total) by mouth every 4 (four) hours as needed for severe pain. 30 tablet 0  . ibuprofen (ADVIL,MOTRIN) 200 MG tablet Take 600 mg by mouth every 6 (six) hours as needed for moderate pain.     Marland Kitchen lactose free nutrition (BOOST PLUS) LIQD Take 237 mLs by mouth daily. (Patient taking  differently: Take 237 mLs by mouth 2 (two) times daily between meals. ) 10 Can 0  . levothyroxine (SYNTHROID) 75 MCG tablet Take 1 tablet (75 mcg total) by mouth daily. 90 tablet 3  . lidocaine-prilocaine (EMLA) cream Apply to affected area once 30 g 3  . Magnesium 400 MG TABS Take 250 mg by mouth daily.    . Menthol-Methyl Salicylate (MUSCLE RUB) 10-15 % CREA Apply 1 application topically as needed for muscle pain.    . metoprolol succinate (TOPROL-XL) 50 MG 24 hr tablet Take 1 tablet (50 mg total) by mouth daily. Take with or immediately following a meal. 90 tablet 3  . Multiple Vitamins-Minerals (MULTIVITAMIN WITH MINERALS) tablet Take 1 tablet by mouth daily.      . Omega-3 Fatty Acids (FISH OIL) 1000 MG CAPS Take 2,000 mg by mouth 2 (two) times daily.     Marland Kitchen omeprazole (PRILOSEC) 40 MG capsule Take 1 capsule (40 mg total) by mouth daily. 90 capsule 3  . ondansetron (ZOFRAN) 8 MG tablet Take 1 tablet (8 mg total) by mouth every 8 (eight) hours as needed (Nausea or vomiting). 30 tablet 1  . polyethylene glycol (MIRALAX / GLYCOLAX) packet Take 17 g by mouth daily. 14 each 0  . prochlorperazine (COMPAZINE) 10 MG tablet Take 1 tablet (10 mg total) by mouth every 6 (six) hours as needed (Nausea or vomiting). 30 tablet 1  . rivaroxaban (XARELTO) 20 MG TABS tablet Take 1 tablet (20 mg total) by mouth daily with supper. 30 tablet 0  . senna (SENOKOT) 8.6 MG TABS tablet Take 1 tablet (8.6 mg total) by mouth at bedtime as needed for mild constipation. 120 each 0  . simvastatin (ZOCOR) 20 MG tablet Take 1 tablet (20 mg total) by mouth at bedtime. 90 tablet 1  . vitamin C (ASCORBIC ACID) 500 MG tablet Take 1,000 mg by mouth daily.     No current facility-administered medications on file prior to visit.     Past Medical History:  Diagnosis Date  . Allergy    SEASONAL  . Anxiety   . Cataract    BILATERAL  . Dysrhythmia   . Family history of lung cancer   . Family history of prostate cancer   .  Family history of prostate cancer   . Family history of uterine cancer   . Fibromyalgia   . GERD (gastroesophageal reflux disease)   . H/O echocardiogram 04/28/08   EF>55% trace mitral regurgitation, No significant valvular pathology  . Hemorrhoids    internal  . History of stress test 02/08/2011   Normal Myocardial perfusion study, this is a low risk scan, No prior study available for comparison  . Hyperlipidemia   . Hypertension    hx of  . Hypothyroidism   . MVP (mitral valve prolapse)    mild and MR  . OA (osteoarthritis)   . Osteoporosis   . ovarian ca dx'd 12/2017   ovarian cancer  .  Paroxysmal A-fib ([place])   . Sleep apnea    does not use prescribed CPAP  does not tolerate  . Thyroid disease    HYPO  . Varicosities of leg   . Vitamin D deficiency    Allergies  Allergen Reactions  . Tramadol Hcl     jittery    Social History   Socioeconomic History  . Marital status: Married    Spouse name: Not on file  . Number of children: Not on file  . Years of education: Not on file  . Highest education level: Not on file  Occupational History  . Not on file  Tobacco Use  . Smoking status: Light Tobacco Smoker    Years: 10.00    Types: Cigars  . Smokeless tobacco: Never Used  . Tobacco comment: not daily  [place] cigars 1-2   Substance and Sexual Activity  . Alcohol use: No    Alcohol/week: 0.0 standard drinks  . Drug use: No  . Sexual activity: Yes    Comment: 1st intercourse 17yo-1 partner  Other Topics Concern  . Not on file  Social History Narrative  . Not on file   Social Determinants of Health   Financial Resource Strain:   . Difficulty of Paying Living Expenses: Not on file  Food Insecurity:   . Worried About Charity fundraiser in the Last Year: Not on file  . Ran Out of Food in the Last Year: Not on file  Transportation Needs:   . Lack of Transportation (Medical): Not on file  . Lack of Transportation (Non-Medical): Not on file  Physical  Activity:   . Days of Exercise per Week: Not on file  . Minutes of Exercise per Session: Not on file  Stress:   . Feeling of Stress : Not on file  Social Connections:   . Frequency of Communication with Friends and Family: Not on file  . Frequency of Social Gatherings with Friends and Family: Not on file  . Attends Religious Services: Not on file  . Active Member of Clubs or Organizations: Not on file  . Attends Archivist Meetings: Not on file  . Marital Status: Not on file    Vitals:   02/08/19 1157  BP: 128/70  Pulse: 63  Resp: 16  SpO2: 96%   Body mass index is 26.7 kg/m.  Physical Exam  Nursing note and vitals reviewed. Constitutional: She is oriented to person, place, and time. She appears well-developed and well-nourished. She does not appear ill. No distress.  HENT:  Head: Normocephalic and atraumatic.  Mouth/Throat: Uvula is midline and oropharynx is clear and moist. Mucous membranes are dry.  Whitish areas on cheeks mucosa, R>L. Mild erythema.No ulcers or aphthous appreciated.   Eyes: Conjunctivae are normal.  Cardiovascular: Normal rate and regular rhythm.  No murmur heard. Respiratory: Effort normal and breath sounds normal. No respiratory distress.  Lymphadenopathy:       Head (right side): No submandibular adenopathy present.       Head (left side): No submandibular adenopathy present.    She has no cervical adenopathy.  Neurological: She is alert and oriented to person, place, and time. She has normal strength. No cranial nerve deficit. Gait normal.  Skin: Skin is warm. No rash noted. No erythema.  Psychiatric: Her mood appears anxious.  Well groomed, poor eye contact.   ASSESSMENT AND PLAN:   Ms. Cleopha was seen today for pale gums, swollen lips and tender inside mouth.  Diagnoses  and all orders for this visit:  Mucositis We discussed diagnosis as well as possible etiologies, including rheumatologic disorder and chemo agents.  Explained  that this is a difficult problem to treat and I do not recommend Amphotericin B. Strongly recommend arranging appointment with dentist. We discussed some adverse effects of dry mouth.  -     Artificial Saliva (SALIVASURE) LOZG; Use as directed 1 lozenge in the mouth or throat every 4 (four) hours as needed.  Thrush, oral Recommend Nystatin drops qid for 14 days. Dry mouth is a predisposing factor.  F/U as needed.  -     nystatin (MYCOSTATIN) 100000 UNIT/ML suspension; Take 5 mLs (500,000 Units total) by mouth 4 (four) times daily.  Secondary malignant neoplasm of retroperitoneum and peritoneum [facility]) Following with oncologist. She completed chemo and next f/u on 03/26/19.  PAF (paroxysmal atrial fibrillation) (HCC) Rhythm and rate controlled. She is not on anticoagulation. Continue Metoprolol Succinate 50 mg daily. Following with cardiologist.   Return if symptoms worsen or fail to improve.  25 min face to face OV. > 50% was dedicated to discussion of Dx, prognosis, treatment options, and some side effects of medications.   Leeanne G. Martinique, MD  [facility]. [place] office.

## 2019-02-08 NOTE — Patient Instructions (Addendum)
A few things to remember from today's visit:   Mucositis - Plan: Artificial Saliva (SALIVASURE) LOZG  Thrush, oral - Plan: nystatin (MYCOSTATIN) 100000 UNIT/ML suspension  Nystatin for white spots on cheeks.  Please arrange appointment with your dentist. Problem could be related to chemo.

## 2019-02-14 ENCOUNTER — Telehealth: Payer: Self-pay | Admitting: Family Medicine

## 2019-02-14 NOTE — Telephone Encounter (Signed)
Pt wants to know how she is suppose to take her medication (nystatin ) she does not know if she is suppose to swallow it or spit it out.

## 2019-02-15 NOTE — Telephone Encounter (Signed)
She swish around the mouth and then spit it out. Thanks, BJ

## 2019-02-15 NOTE — Telephone Encounter (Signed)
I called and spoke with pt. We went over the instructions below & she verbalized understanding.

## 2019-02-20 ENCOUNTER — Other Ambulatory Visit: Payer: Self-pay | Admitting: Cardiovascular Disease

## 2019-02-20 ENCOUNTER — Encounter: Payer: Self-pay | Admitting: Family Medicine

## 2019-02-20 DIAGNOSIS — I480 Paroxysmal atrial fibrillation: Secondary | ICD-10-CM

## 2019-02-20 DIAGNOSIS — I10 Essential (primary) hypertension: Secondary | ICD-10-CM

## 2019-02-20 MED ORDER — FLUTICASONE PROPIONATE 50 MCG/ACT NA SUSP: 2.0000 | Freq: Every day | NASAL | 2 refills | Status: DC

## 2019-02-20 MED ORDER — RIVAROXABAN 20 MG PO TABS: 20.0000 mg | ORAL_TABLET | Freq: Every day | ORAL | 0 refills | Status: DC

## 2019-02-20 MED ORDER — METOPROLOL SUCCINATE ER 50 MG PO TB24: 50.0000 mg | ORAL_TABLET | Freq: Every day | ORAL | 2 refills | Status: DC

## 2019-02-20 NOTE — Telephone Encounter (Signed)
*  STAT* If patient is at the pharmacy, call can be transferred to refill team.   1. Which medications need to be refilled? (please list name of each medication and dose if known) Xarelto  2. Which pharmacy/location (including street and city if local pharmacy) is medication to be sent to [place]   3. Do they need a 30 day or 90 day supply?90 days and refills

## 2019-02-20 NOTE — Telephone Encounter (Signed)
*  STAT* If patient is at the pharmacy, call can be transferred to refill team.   1. Which medications need to be refilled? (please list name of each medication and dose if known)  Metoprolol  2. Which pharmacy/location (including street and city if local pharmacy) is medication to be sent to? [phone]  3. Do they need a 30 day or 90 day supply? 90 days and refills

## 2019-02-21 DIAGNOSIS — L218 Other seborrheic dermatitis: Secondary | ICD-10-CM | POA: Diagnosis not present

## 2019-02-21 DIAGNOSIS — K130 Diseases of lips: Secondary | ICD-10-CM | POA: Diagnosis not present

## 2019-02-21 DIAGNOSIS — L578 Other skin changes due to chronic exposure to nonionizing radiation: Secondary | ICD-10-CM | POA: Diagnosis not present

## 2019-02-21 DIAGNOSIS — Z1283 Encounter for screening for malignant neoplasm of skin: Secondary | ICD-10-CM | POA: Diagnosis not present

## 2019-02-28 ENCOUNTER — Other Ambulatory Visit: Payer: Self-pay | Admitting: Physician Assistant

## 2019-02-28 NOTE — Telephone Encounter (Signed)
73 F 66.2 kg, SCr 0.75 (1/21) CrCl 68.8;  LOV 11/20 Claiborne Billings

## 2019-03-12 ENCOUNTER — Other Ambulatory Visit: Payer: Self-pay

## 2019-03-13 ENCOUNTER — Ambulatory Visit (INDEPENDENT_AMBULATORY_CARE_PROVIDER_SITE_OTHER): Payer: PPO | Admitting: Family Medicine

## 2019-03-13 ENCOUNTER — Encounter: Payer: Self-pay | Admitting: Family Medicine

## 2019-03-13 VITALS — BP 128/80 | HR 97 | Resp 12 | Ht 62.0 in | Wt 145.0 lb

## 2019-03-13 DIAGNOSIS — R590 Localized enlarged lymph nodes: Secondary | ICD-10-CM | POA: Diagnosis not present

## 2019-03-13 DIAGNOSIS — M79622 Pain in left upper arm: Secondary | ICD-10-CM | POA: Diagnosis not present

## 2019-03-13 DIAGNOSIS — N644 Mastodynia: Secondary | ICD-10-CM | POA: Diagnosis not present

## 2019-03-13 MED ORDER — DOXYCYCLINE HYCLATE 100 MG PO TABS: 100.0000 mg | ORAL_TABLET | Freq: Two times a day (BID) | ORAL | 0 refills | Status: DC

## 2019-03-13 MED ORDER — DOXYCYCLINE HYCLATE 100 MG PO TABS: 100.0000 mg | ORAL_TABLET | Freq: Two times a day (BID) | ORAL | 0 refills | Status: AC

## 2019-03-13 NOTE — Progress Notes (Signed)
ACUTE VISIT   HPI:  Chief Complaint  Patient presents with  . knot under arm    Haley [name] is a 74 y.o. female, who is here today complaining of tender "knot" in left axilla noted last week. This is a new problem.  It is sore,she is not sure about growth. There is not skin changes or drainage. Pain is mild to moderate constant,exacerbated by palpation. No alleviating factors identified.  Negative for associated fever,chills,unusual fatigue,night sweats, cough,wheezing,SOB,nipple discharge, or breast masses. It has been stable.  No Hx of trauma. She has not tried OTC treatments.  Last mammogram 12/2018 [place] 1.  She follows with oncologist for right ovarian cancer. She has not noted abdominal pain,changes in bowel habits,or N/V. She has nausea when she receives chemotherapy.  + Smoker.  Review of Systems  Constitutional: Negative for chills and fever.  HENT: Negative for hearing loss, mouth sores and sore throat.   Cardiovascular: Negative for chest pain.  Genitourinary: Negative for dysuria, frequency and hematuria.  Musculoskeletal: Negative for gait problem and myalgias.  Neurological: Negative for weakness and headaches.  Rest see pertinent positives and negatives per HPI.  Current Outpatient Medications on File Prior to Visit  Medication Sig Dispense Refill  . acetaminophen (TYLENOL) 500 MG tablet Take 2 tablets (1,000 mg total) by mouth every 6 (six) hours. (Patient taking differently: Take 1,000 mg by mouth every 6 (six) hours as needed. ) 30 tablet 0  . ALPRAZolam (XANAX) 0.5 MG tablet Take 0.5-1 tablets (0.25-0.5 mg total) by mouth daily as needed for anxiety. 30 tablet 2  . alum & mag hydroxide-simeth (MAALOX/MYLANTA) 200-200-20 MG/5ML suspension Take 30 mLs by mouth every 6 (six) hours as needed for indigestion or heartburn. 355 mL 0  . Artificial Saliva (SALIVASURE) LOZG Use as directed 1 lozenge in the mouth or throat every 4 (four) hours  as needed. 90 lozenge 0  . Calcium Carbonate (CALTRATE 600) 1500 MG TABS Take 600 mg of elemental calcium by mouth daily.     . cholecalciferol (VITAMIN D) 1000 UNITS tablet Take 1,000 Units by mouth daily.     Marland Kitchen estradiol (ESTRACE) 0.1 MG/GM vaginal cream Place 1 Applicatorful vaginally 3 (three) times a week. 42.5 g 12  . fluticasone (FLONASE) 50 MCG/ACT nasal spray Place 2 sprays into both nostrils daily. 48 g 2  . gabapentin (NEURONTIN) 300 MG capsule Take 1 capsule (300 mg total) by mouth 2 (two) times daily. 60 capsule 11  . HYDROmorphone (DILAUDID) 2 MG tablet Take 1 tablet (2 mg total) by mouth every 4 (four) hours as needed for severe pain. 30 tablet 0  . ibuprofen (ADVIL,MOTRIN) 200 MG tablet Take 600 mg by mouth every 6 (six) hours as needed for moderate pain.     Marland Kitchen lactose free nutrition (BOOST PLUS) LIQD Take 237 mLs by mouth daily. (Patient taking differently: Take 237 mLs by mouth 2 (two) times daily between meals. ) 10 Can 0  . levothyroxine (SYNTHROID) 75 MCG tablet Take 1 tablet (75 mcg total) by mouth daily. 90 tablet 3  . lidocaine-prilocaine (EMLA) cream Apply to affected area once 30 g 3  . Magnesium 400 MG TABS Take 250 mg by mouth daily.    . Menthol-Methyl Salicylate (MUSCLE RUB) 10-15 % CREA Apply 1 application topically as needed for muscle pain.    . metoprolol succinate (TOPROL-XL) 50 MG 24 hr tablet Take 1 tablet (50 mg total) by mouth daily. Take with or immediately  following a meal. 90 tablet 2  . Multiple Vitamins-Minerals (MULTIVITAMIN WITH MINERALS) tablet Take 1 tablet by mouth daily.      Marland Kitchen nystatin (MYCOSTATIN) 100000 UNIT/ML suspension Take 5 mLs (500,000 Units total) by mouth 4 (four) times daily. 473 mL 0  . Omega-3 Fatty Acids (FISH OIL) 1000 MG CAPS Take 2,000 mg by mouth 2 (two) times daily.     Marland Kitchen omeprazole (PRILOSEC) 40 MG capsule Take 1 capsule (40 mg total) by mouth daily. 90 capsule 3  . ondansetron (ZOFRAN) 8 MG tablet Take 1 tablet (8 mg total) by  mouth every 8 (eight) hours as needed (Nausea or vomiting). 30 tablet 1  . polyethylene glycol (MIRALAX / GLYCOLAX) packet Take 17 g by mouth daily. 14 each 0  . prochlorperazine (COMPAZINE) 10 MG tablet Take 1 tablet (10 mg total) by mouth every 6 (six) hours as needed (Nausea or vomiting). 30 tablet 1  . senna (SENOKOT) 8.6 MG TABS tablet Take 1 tablet (8.6 mg total) by mouth at bedtime as needed for mild constipation. 120 each 0  . simvastatin (ZOCOR) 20 MG tablet Take 1 tablet (20 mg total) by mouth at bedtime. 90 tablet 1  . vitamin C (ASCORBIC ACID) 500 MG tablet Take 1,000 mg by mouth daily.    [name] 20 MG TABS tablet TAKE 1 TABLET BY MOUTH ONCE DAILY WITH  SUPPER 90 tablet 1   No current facility-administered medications on file prior to visit.   Past Medical History:  Diagnosis Date  . Allergy    SEASONAL  . Anxiety   . Cataract    BILATERAL  . Dysrhythmia   . Family history of lung cancer   . Family history of prostate cancer   . Family history of prostate cancer   . Family history of uterine cancer   . Fibromyalgia   . GERD (gastroesophageal reflux disease)   . H/O echocardiogram 04/28/08   EF>55% trace mitral regurgitation, No significant valvular pathology  . Hemorrhoids    internal  . History of stress test 02/08/2011   Normal Myocardial perfusion study, this is a low risk scan, No prior study available for comparison  . Hyperlipidemia   . Hypertension    hx of  . Hypothyroidism   . MVP (mitral valve prolapse)    mild and MR  . OA (osteoarthritis)   . Osteoporosis   . ovarian ca dx'd 12/2017   ovarian cancer  . Paroxysmal A-fib ([place])   . Sleep apnea    does not use prescribed CPAP  does not tolerate  . Thyroid disease    HYPO  . Varicosities of leg   . Vitamin D deficiency    Allergies  Allergen Reactions  . Tramadol Hcl     jittery    Social History   Socioeconomic History  . Marital status: Married    Spouse name: Not on file  . Number of  children: Not on file  . Years of education: Not on file  . Highest education level: Not on file  Occupational History  . Not on file  Tobacco Use  . Smoking status: Light Tobacco Smoker    Years: 10.00    Types: Cigars  . Smokeless tobacco: Never Used  . Tobacco comment: not daily  Haley [name] cigars 1-2   Substance and Sexual Activity  . Alcohol use: No    Alcohol/week: 0.0 standard drinks  . Drug use: No  . Sexual activity: Yes    Comment:  1st intercourse 17yo-1 partner  Other Topics Concern  . Not on file  Social History Narrative  . Not on file   Social Determinants of Health   Financial Resource Strain:   . Difficulty of Paying Living Expenses: Not on file  Food Insecurity:   . Worried About Charity fundraiser in the Last Year: Not on file  . Ran Out of Food in the Last Year: Not on file  Transportation Needs:   . Lack of Transportation (Medical): Not on file  . Lack of Transportation (Non-Medical): Not on file  Physical Activity:   . Days of Exercise per Week: Not on file  . Minutes of Exercise per Session: Not on file  Stress:   . Feeling of Stress : Not on file  Social Connections:   . Frequency of Communication with Friends and Family: Not on file  . Frequency of Social Gatherings with Friends and Family: Not on file  . Attends Religious Services: Not on file  . Active Member of Clubs or Organizations: Not on file  . Attends Archivist Meetings: Not on file  . Marital Status: Not on file    Vitals:   03/13/19 1424  BP: 128/80  Pulse: 97  Resp: 12  SpO2: 90%   Body mass index is 26.52 kg/m.   Physical Exam  Nursing note and vitals reviewed. Constitutional: She is oriented to person, place, and time. She appears well-developed. No distress.  HENT:  Head: Normocephalic and atraumatic.  Mouth/Throat: Oropharynx is clear and moist and mucous membranes are normal.  Eyes: Conjunctivae are normal.  Cardiovascular: Normal rate and regular  rhythm.  No murmur heard. Pulses:      Dorsalis pedis pulses are 2+ on the right side and 2+ on the left side.  Respiratory: Effort normal and breath sounds normal. No respiratory distress.  GI: Soft. She exhibits no mass. There is no hepatomegaly. There is no abdominal tenderness.  Genitourinary:    Genitourinary Comments: Breast: No masses,skin changes,or nipple discharge. Tenderness upon palpation of left breast, outer upper quadrant.   Musculoskeletal:        General: No edema.  Lymphadenopathy:    She has no cervical adenopathy.    She has axillary adenopathy.       Left axillary: Lateral adenopathy present.  Neurological: She is alert and oriented to person, place, and time. She has normal strength. No cranial nerve deficit. Gait normal.  Skin: Skin is warm. No rash noted. No erythema.  Psychiatric: Her mood appears anxious.  Well groomed, good eye contact.    ASSESSMENT AND PLAN:  Ms. Simran was seen today for knot under arm.  Diagnoses and all orders for this visit:  Axillary tenderness, left We discussed possible etiologies. ? Infectious process. Empiric abx treatment recommended. Warm compresses a few times during the day. Side effects of bax discussed.  -     doxycycline (VIBRA-TABS) 100 MG tablet; Take 1 tablet (100 mg total) by mouth 2 (two) times daily for 7 days.  Axillary lymphadenopathy Concerning given her hx of ovarian cancer. Monitor for new symptoms.  -     MM DIAG BREAST TOMO BILATERAL; Future  Breast tenderness in female She was not aware ot tenderness until palpation in office. Dx mammogram will be arranged.  -     MM DIAG BREAST TOMO BILATERAL; Future   Return if symptoms worsen or fail to improve.   Rhayne G. Martinique, MD  [facility]. [place] office.

## 2019-03-13 NOTE — Patient Instructions (Signed)
A few things to remember from today's visit:   Axillary tenderness, left - Plan: doxycycline (VIBRA-TABS) 100 MG tablet  Axillary lymphadenopathy - Plan: MM DIAG BREAST TOMO BILATERAL  Breast tenderness in female - Plan: MM DIAG BREAST TOMO BILATERAL  Take antibiotic with food and apply local heat.  Please be sure medication list is accurate. If a new problem present, please set up appointment sooner than planned today.

## 2019-03-19 ENCOUNTER — Other Ambulatory Visit: Payer: Self-pay | Admitting: Family Medicine

## 2019-03-19 DIAGNOSIS — N644 Mastodynia: Secondary | ICD-10-CM

## 2019-03-19 DIAGNOSIS — R590 Localized enlarged lymph nodes: Secondary | ICD-10-CM

## 2019-03-19 DIAGNOSIS — F419 Anxiety disorder, unspecified: Secondary | ICD-10-CM

## 2019-03-19 NOTE — Telephone Encounter (Signed)
Rx last filled 01/12/19

## 2019-03-26 ENCOUNTER — Inpatient Hospital Stay: Payer: PPO

## 2019-03-26 ENCOUNTER — Inpatient Hospital Stay: Payer: PPO | Attending: Obstetrics | Admitting: Hematology and Oncology

## 2019-03-26 ENCOUNTER — Telehealth: Payer: Self-pay | Admitting: Hematology and Oncology

## 2019-03-26 ENCOUNTER — Encounter: Payer: Self-pay | Admitting: Hematology and Oncology

## 2019-03-26 ENCOUNTER — Other Ambulatory Visit: Payer: Self-pay

## 2019-03-26 ENCOUNTER — Telehealth: Payer: Self-pay

## 2019-03-26 VITALS — BP 147/61 | HR 51 | Temp 99.1°F | Resp 18 | Ht 62.0 in | Wt 145.2 lb

## 2019-03-26 DIAGNOSIS — R590 Localized enlarged lymph nodes: Secondary | ICD-10-CM

## 2019-03-26 DIAGNOSIS — Z90710 Acquired absence of both cervix and uterus: Secondary | ICD-10-CM | POA: Insufficient documentation

## 2019-03-26 DIAGNOSIS — I10 Essential (primary) hypertension: Secondary | ICD-10-CM | POA: Insufficient documentation

## 2019-03-26 DIAGNOSIS — G620 Drug-induced polyneuropathy: Secondary | ICD-10-CM | POA: Diagnosis not present

## 2019-03-26 DIAGNOSIS — C773 Secondary and unspecified malignant neoplasm of axilla and upper limb lymph nodes: Secondary | ICD-10-CM | POA: Diagnosis not present

## 2019-03-26 DIAGNOSIS — K5900 Constipation, unspecified: Secondary | ICD-10-CM | POA: Diagnosis not present

## 2019-03-26 DIAGNOSIS — C569 Malignant neoplasm of unspecified ovary: Secondary | ICD-10-CM

## 2019-03-26 DIAGNOSIS — R140 Abdominal distension (gaseous): Secondary | ICD-10-CM | POA: Insufficient documentation

## 2019-03-26 DIAGNOSIS — Z79899 Other long term (current) drug therapy: Secondary | ICD-10-CM | POA: Insufficient documentation

## 2019-03-26 DIAGNOSIS — Z7901 Long term (current) use of anticoagulants: Secondary | ICD-10-CM | POA: Diagnosis not present

## 2019-03-26 DIAGNOSIS — Z90722 Acquired absence of ovaries, bilateral: Secondary | ICD-10-CM | POA: Insufficient documentation

## 2019-03-26 DIAGNOSIS — C561 Malignant neoplasm of right ovary: Secondary | ICD-10-CM

## 2019-03-26 DIAGNOSIS — Z9221 Personal history of antineoplastic chemotherapy: Secondary | ICD-10-CM | POA: Insufficient documentation

## 2019-03-26 DIAGNOSIS — M79606 Pain in leg, unspecified: Secondary | ICD-10-CM

## 2019-03-26 DIAGNOSIS — T451X5A Adverse effect of antineoplastic and immunosuppressive drugs, initial encounter: Secondary | ICD-10-CM

## 2019-03-26 LAB — CBC WITH DIFFERENTIAL (CANCER CENTER ONLY)
Abs Immature Granulocytes: 0.03 10*3/uL (ref 0.00–0.07)
Basophils Absolute: 0.1 10*3/uL (ref 0.0–0.1)
Basophils Relative: 1 %
Eosinophils Absolute: 0.5 10*3/uL (ref 0.0–0.5)
Eosinophils Relative: 7 %
HCT: 37.4 % (ref 36.0–46.0)
Hemoglobin: 12 g/dL (ref 12.0–15.0)
Immature Granulocytes: 0 %
Lymphocytes Relative: 30 %
Lymphs Abs: 2.1 10*3/uL (ref 0.7–4.0)
MCH: 30.9 pg (ref 26.0–34.0)
MCHC: 32.1 g/dL (ref 30.0–36.0)
MCV: 96.4 fL (ref 80.0–100.0)
Monocytes Absolute: 0.6 10*3/uL (ref 0.1–1.0)
Monocytes Relative: 8 %
Neutro Abs: 3.8 10*3/uL (ref 1.7–7.7)
Neutrophils Relative %: 54 %
Platelet Count: 170 10*3/uL (ref 150–400)
RBC: 3.88 MIL/uL (ref 3.87–5.11)
RDW: 13.2 % (ref 11.5–15.5)
WBC Count: 7 10*3/uL (ref 4.0–10.5)
nRBC: 0 % (ref 0.0–0.2)

## 2019-03-26 LAB — CMP (CANCER CENTER ONLY)
ALT: 11 U/L (ref 0–44)
AST: 14 U/L — ABNORMAL LOW (ref 15–41)
Albumin: 3.3 g/dL — ABNORMAL LOW (ref 3.5–5.0)
Alkaline Phosphatase: 78 U/L (ref 38–126)
Anion gap: 9 (ref 5–15)
BUN: 11 mg/dL (ref 8–23)
CO2: 26 mmol/L (ref 22–32)
Calcium: 8.7 mg/dL — ABNORMAL LOW (ref 8.9–10.3)
Chloride: 106 mmol/L (ref 98–111)
Creatinine: 0.76 mg/dL (ref 0.44–1.00)
GFR, Est AFR Am: >60 mL/min (ref >60)
GFR, Estimated: >60 mL/min (ref >60)
Glucose, Bld: 100 mg/dL — ABNORMAL HIGH (ref 70–99)
Potassium: 3.6 mmol/L (ref 3.5–5.1)
Sodium: 141 mmol/L (ref 135–145)
Total Bilirubin: 0.3 mg/dL (ref 0.3–1.2)
Total Protein: 6.5 g/dL (ref 6.5–8.1)

## 2019-03-26 MED ORDER — SODIUM CHLORIDE 0.9% FLUSH
10.0000 mL | Freq: Once | INTRAVENOUS | Status: AC
  Administered 2019-03-26: 10 mL
  Filled 2019-03-26: qty 10

## 2019-03-26 MED ORDER — HEPARIN SOD (PORK) LOCK FLUSH 100 UNIT/ML IV SOLN
500.0000 [IU] | Freq: Once | INTRAVENOUS | Status: AC
  Administered 2019-03-26: 500 [IU]
  Filled 2019-03-26: qty 5

## 2019-03-26 MED ORDER — HEPARIN SOD (PORK) LOCK FLUSH 100 UNIT/ML IV SOLN
250.0000 [IU] | Freq: Once | INTRAVENOUS | Status: DC
  Filled 2019-03-26: qty 5

## 2019-03-26 NOTE — Assessment & Plan Note (Signed)
She has symptoms of abdominal bloating and palpable lymphadenopathy in the left axilla, worrisome for metastatic disease I recommend CT scan of the chest, abdomen and pelvis for further evaluation I will see her back next week to review test results Her primary care doctor has ordered mammogram and evaluation but I think CT scan might be more helpful given her background history

## 2019-03-26 NOTE — Assessment & Plan Note (Signed)
She has some mild residual neuropathy from treatment but not too symptomatic She has been prescribed gabapentin to take

## 2019-03-26 NOTE — Telephone Encounter (Signed)
Phone line busy. Called to give CT scan appt time/ date on 2/23 at 0930 at [facility], arrive at 0900.  Will offer appt with Dr. Alvy Bimler for 12/23 at 1230, can be in person or virtual her preference.

## 2019-03-26 NOTE — Progress Notes (Signed)
[place] OFFICE PROGRESS NOTE  Patient Care Team: Martinique, Tayley G, MD as PCP - General (Family Medicine) Troy Sine, MD as PCP - Cardiology (Cardiology) Awanda Mink Craige Cotta, RN as Oncology Nurse Navigator (Oncology)  ASSESSMENT & PLAN:  Right ovarian epithelial cancer [facility]) She has symptoms of abdominal bloating and palpable lymphadenopathy in the left axilla, worrisome for metastatic disease I recommend CT scan of the chest, abdomen and pelvis for further evaluation I will see her back next week to review test results Her primary care doctor has ordered mammogram and evaluation but I think CT scan might be more helpful given her background history  Peripheral neuropathy due to chemotherapy [facility]) She has some mild residual neuropathy from treatment but not too symptomatic She has been prescribed gabapentin to take  Lymphadenopathy, axillary The left axillary lymph node is new I am concerned about metastatic disease I will order CT scan of the chest for evaluation and she agreed   Orders Placed This Encounter  Procedures  . CT ABDOMEN PELVIS W CONTRAST    Standing Status:   Future    Standing Expiration Date:   03/25/2020    Order Specific Question:   If indicated for the ordered procedure, I authorize the administration of contrast media per Radiology protocol    Answer:   Yes    Order Specific Question:   Preferred imaging location?    Answer:   [facility]    Order Specific Question:   Radiology Contrast Protocol - do NOT remove file path    Answer:   \\[email or web address]    Order Specific Question:   ** REASON FOR EXAM (FREE TEXT)    Answer:   abdominal bloating, ovarian ca  . CT CHEST W CONTRAST    Standing Status:   Future    Standing Expiration Date:   03/25/2020    Order Specific Question:   If indicated for the ordered procedure, I authorize the administration of contrast media per Radiology protocol    Answer:   Yes     Order Specific Question:   Preferred imaging location?    Answer:   [facility]    Order Specific Question:   Radiology Contrast Protocol - do NOT remove file path    Answer:   \\[email or web address]    Order Specific Question:   ** REASON FOR EXAM (FREE TEXT)    Answer:   palpable left axilla LN enlargment, hx ovarian ca    All questions were answered. The patient knows to call the clinic with any problems, questions or concerns. The total time spent in the appointment was 20 minutes encounter with patients including review of chart and various tests results, discussions about plan of care and coordination of care plan   Heath Lark, MD 03/26/2019 12:37 PM  INTERVAL HISTORY: Please see below for problem oriented charting. She returns for further follow-up She palpated new lymphadenopathy on the left axilla recently She saw her primary care doctor who ordered mammogram and ultrasound She denies breast lesions She felt some abdominal bloating but denies nausea or constipation No abdominal pain  SUMMARY OF ONCOLOGIC HISTORY: Oncology History Overview Note  Neg for genetics blood work and HRD   Right ovarian epithelial cancer ([place])  01/06/2018 Imaging   Ct abdomen and pelvis 1. Complex cystic mass at the right adnexa, measuring 5.9 x 4.0 cm, with nodular components, concerning for primary ovarian malignancy. 2. Diffuse nodularity along the omentum at the left side  of the abdomen, extending into the mesentery at the left mid abdomen, concerning for peritoneal carcinomatosis. 3. Wall thickening at the distal ileum adjacent to the ovarian mass; bowel loops appear somewhat adherent to the ovarian mass. Bowel infiltration with tumor cannot be excluded. No evidence of bowel obstruction at this time. 4. Small volume ascites within the abdomen and pelvis.  Aortic Atherosclerosis (ICD10-I70.0).   01/08/2018 Tumor Marker   Patient's tumor was tested for the  following markers: CA-125. Results of the tumor marker test revealed 3004   01/15/2018 Imaging   Chest CT:  1. No active cardiopulmonary disease. 2. Aortic atherosclerosis without aneurysm or dissection. 3. No large central pulmonary embolus.  CT AP:  1. Dilated fluid-filled loops of small bowel are redemonstrated slightly more extensive than on prior exam with transition point likely in the right adnexa adjacent to a complex cystic mass concerning for ovarian neoplasm given septations and soft tissue nodularity. This raises concern for early or partial SBO. This soft tissue mass measures 4.9 x 4 x 4.7 cm and has not changed since prior recent comparison. Additional short segmental area of luminal narrowing is noted in the right lower quadrant involving small bowel for which stigmata of peritoneal carcinomatosis or small-bowel metastatic implants might account for this. 2. Redemonstration of small volume of ascites predominantly in the upper abdomen surrounding the liver and spleen. 3. Redemonstration of thick bandlike omental thickening concerning for peritoneal carcinomatosis.    01/15/2018 Procedure   Successful ultrasound-guided diagnostic and therapeutic paracentesis yielding 1.5 liters of peritoneal fluid.    01/15/2018 Pathology Results   PERITONEAL/ASCITIC FLUID (SPECIMEN 1 OF 1 COLLECTED 01/18/18): MALIGNANT CELLS CONSISTENT WITH METASTATIC ADENOCARCINOMA. SEE COMMENT. COMMENT: THE MALIGNANT CELLS ARE POSITIVE FOR MOC-31, CYTOKERATIN 7, ESTROGEN RECEPTOR, PAX-8, AND WT-1. THEY ARE NEGATIVE FOR CALRETININ, CYTOKERATIN 5/6, AND CYTOKERATIN 20. THE PROFILE IS CONSISTENT WITH A PRIMARY GYNECOLOGIC CARCINOMA. THERE IS LIKELY SUFFICIENT TUMOR PRESENT, IF ADDITIONAL STUDIES ARE REQUESTED.   01/15/2018 - 02/02/2018 Hospital Admission   She was admitted to the hospital for SBO. She was treated with chemotherapy   01/18/2018 - 06/20/2018 Chemotherapy   The patient had carboplatin and taxol    02/08/2018 Cancer Staging   Staging form: Ovary, Fallopian Tube, and Primary Peritoneal Carcinoma, AJCC 8th Edition - Clinical: cT3, cN0, cM0 - Signed by Heath Lark, MD on 02/08/2018   02/08/2018 Tumor Marker   Patient's tumor was tested for the following markers: CA-125. Results of the tumor marker test revealed 445   03/02/2018 Tumor Marker   Patient's tumor was tested for the following markers: CA-125. Results of the tumor marker test revealed 174   03/15/2018 Imaging   6.2 cm complex cystic right ovarian mass, compatible with malignant ovarian neoplasm, mildly progressive.  Associated peritoneal disease/omental caking beneath the anterior abdominal wall, mildly improved. Prior abdominal ascites is improved/resolved.  4.7 cm cystic left ovarian mass, without overt malignant features, grossly unchanged.  Mild bilateral hydroureteronephrosis, secondary to extrinsic compression, new.  Prior small bowel obstruction has improved/resolved.    03/27/2018 Pathology Results   1. Omentum, resection for tumor - OMENTUM: - HIGH GRADE SEROUS CARCINOMA. 2. Ovary, left - LEFT OVARY: - HIGH GRADE SEROUS CARCINOMA. - LEFT FALLOPIAN TUBE: - HIGH GRADE SEROUS CARCINOMA. 3. Adnexa - ovary +/- tube, neoplastic, right - RIGHT OVARY: - HIGH GRADE SEROUS CARCINOMA, 5.5 CM. - RIGHT FALLOPIAN TUBE: - HIGH GRADE SEROUS CARCINOMA. 4. Peritoneum, biopsy, nodule - PERITONEUM: - NODULES OF HIGH GRADE SEROUS CARCINOMA. 5. Peritoneum,  resection for tumor, rectosigmoid - RECTOSIGMOID PERITONEUM: - HIGH GRADE SEROUS CARCINOMA. Microscopic Comment 3. OVARY or FALLOPIAN TUBE or PRIMARY PERITONEUM: Procedure: Bilateral salpingo-oophorectomy, omentectomy and peritoneal biopsies. Specimen Integrity: N/A. Tumor Site: Right ovary. Ovarian Surface Involvement (required only if applicable): Yes. Fallopian Tube Surface Involvement (required only if applicable): Yes. Tumor Size: 5.5 x 4.8 x 4.0  cm. Histologic Type: Serous carcinoma. Histologic Grade: High grade. Implants (required for advanced stage serous/seromucinous borderline tumors only): Omentum, left ovary and fallopian tube, rectosigmoid peritoneum and peritoneum. Other Tissue/ Organ Involvement: Left ovary and fallopian tube, omentum and rectosigmoid peritoneum. Largest Extrapelvic Peritoneal Focus (required only if applicable): 35.5 cm, omentum. Peritoneal/Ascitic Fluid: N/A. Treatment Effect (required only for high-grade serous carcinomas): Minimal. Regional Lymph Nodes: No lymph nodes submitted. Pathologic Stage Classification (pTNM, AJCC 8th Edition): pT3c, pNX. Representative Tumor Block: 3A, 3B, 3D, 3E and 81F. Comment(s): There is a 5.5 cm in greatest dimension partially cystic high grade serous carcinoma involving the right adnexal specimen and the tumor is staged as a primary right ovarian carcinoma. The carcinoma also involves the left ovary as well as the left fallopian tube, the omentum, peritoneum and the rectosigmoid peritoneal specimens.   03/27/2018 Surgery   Preoperative Diagnosis: ovarian cancer, stage IIIC, metastatic to omentum, peritoneum, serosa of intestines   Procedure(s) Performed: 1. Exploratory laparotomy with bilateral salpingo-oophorectomy, omentectomy radical tumor debulking for ovarian cancer .  Surgeon: Thereasa Solo, MD.   Specimens: Bilateral tubes / ovaries, omentum. Peritoneal nodules, rectosigmoid nodules.    Operative Findings: omental cake, miliary studding on diaphragm and bilateral paracolic gutters. Sigmoid colon densely adherent to left and right ovaries with tumor rind, ureters mildly dilated bilaterally due to retroperitoneal extension of the tumor towards ureters causing compression.    This represented an optimal cytoreduction (R1) with gross residual disease on the diaphragm that had been ablated and a thin tumor plaque on the sigmoid colon that was ablated. No residual  disease >1cm remaining   05/07/2018 Genetic Testing   Negative genetic testing on the [employer] TumorNextHRD+ CancerNext Panel. The CancerNext gene panel offered by [employer] includes sequencing and rearrangement analysis for the following 34 genes:   APC, ATM, BARD1, BMPR1A, BRCA1, BRCA2, BRIP1, CDH1, CDK4, CDKN2A, CHEK2, DICER1, EPCAM, GREM1, HOXB13, MLH1, MRE11A, MSH2, MSH6, MUTYH, NBN, NF1, PALB2, PMS2, POLD1, POLE, PTEN, RAD50, RAD51C, RAD51D, SMAD4, SMARCA4, STK11, and TP53. Somatic genes analyzed through TumorNext-HRD: ATM, BARD1, BRCA1, BRCA2, BRIP1, CHEK2, MRE11A, NBN, PALB2, RAD51C, RAD51D. The report date is 05/07/2018.   05/11/2018 Tumor Marker   Patient's tumor was tested for the following markers: CA-125. Results of the tumor marker test revealed 60.5   07/02/2018 Imaging   1. Mild right pericardiophrenic adenopathy is mildly increased, suspicious for metastatic nodes. No abdominopelvic adenopathy. 2. Small left peritoneal soft tissue nodule adjacent to the splenic flexure and smooth left pelvic sidewall peritoneal thickening, cannot exclude residual/recurrent disease. Attention on follow-up CT advised. 3. Bilateral renal collecting system dilatation has improved. 4.  Aortic Atherosclerosis (ICD10-I70.0).   07/02/2018 Tumor Marker   Patient's tumor was tested for the following markers: CA-125 Results of the tumor marker test revealed 42.7   07/11/2018 Echocardiogram   1. The left ventricle has normal systolic function with an ejection fraction of 60-65%. The cavity size was normal. Left ventricular diastolic parameters were normal.  2. Normal GLS -20.1.  3. The right ventricle has normal systolic function. The cavity was normal. There is no increase in right ventricular wall thickness.  4.  The mitral valve is degenerative. Moderate thickening of the mitral valve leaflet. Moderate calcification of the mitral valve leaflet.  5. The aortic valve is tricuspid. Moderate thickening of the  aortic valve. Moderate calcification of the aortic valve. Aortic valve regurgitation is trivial by color flow Doppler.  6. The aortic root is normal in size and structure.    Chemotherapy   The patient had doxorubicin and bevacizumab for chemotherapy treatment.     07/30/2018 Tumor Marker   Patient's tumor was tested for the following markers: CA-125 Results of the tumor marker test revealed 82.6   08/27/2018 Tumor Marker   Patient's tumor was tested for the following markers: CA-125 Results of the tumor marker test revealed 111.   09/24/2018 Tumor Marker   Patient's tumor was tested for the following markers: CA-125 Results of the tumor marker test revealed 142.   10/05/2018 Imaging   1. Stable right juxta diaphragmatic/pericardiophrenic lymph node, mildly enlarged. 2. Otherwise no definite metastatic disease identified in the abdomen/pelvis. 3. 3 mm peritoneal nodule noted left paracolic gutter on today's study, indeterminate. Attention on follow-up recommended. 4.  Aortic Atherosclerois (ICD10-170.0)   10/16/2018 Echocardiogram    1. No significant change from prior study (07/11/2018).  2. Left ventricular ejection fraction, by visual estimation, is 60 to 65%. The left ventricle has normal function. Normal left ventricular size. There is no left ventricular hypertrophy.  3. LVEF by 3D assessment 63%.  4. The average left ventricular global longitudinal strain is -22.4 %.  5. Global right ventricle has normal systolic function.The right ventricular size is normal. No increase in right ventricular wall thickness.  6. Left atrial size was normal.  7. Right atrial size was normal.  8. Presence of pericardial fat pad.  9. Moderate thickening of the mitral valve leaflet(s). 10. The mitral valve is normal in structure. Trace mitral valve regurgitation. 11. The tricuspid valve is normal in structure. Tricuspid valve regurgitation is mild. 12. Aortic regurgitation PHT measures 467 msec. 13.  The aortic valve is tricuspid Aortic valve regurgitation is mild by color flow Doppler. Mild aortic valve sclerosis without stenosis. 14. There is mild to moderate calcification of the AoV, with focal calcification of the Jennings. The aortic regurgitation is mild in severity, likely related to degenerative valve disease. 15. The pulmonic valve was grossly normal. Pulmonic valve regurgitation is not visualized by color flow Doppler. 16. Mild plaque invoving the ascending aorta. 17. Normal pulmonary artery systolic pressure. 18. The tricuspid regurgitant velocity is 2.52 m/s, and with an assumed right atrial pressure of 3 mmHg, the estimated right ventricular systolic pressure is normal at 28.4 mmHg.   10/26/2018 Tumor Marker   Patient's tumor was tested for the following markers: CA-125 Results of the tumor marker test revealed 196   12/11/2018 Imaging   1. No substantial interval change in exam. No definite findings to suggest recurrent/metastatic disease. 2. Right pericardial phrenic lymph node noted on multiple prior studies is unchanged in the interval. 3. 8 mm omental nodule identified on today's exam. Close attention on follow-up recommended. 3 mm soft tissue nodule along the left paracolic gutter seen previously has resolved in the interval. 4.  Aortic Atherosclerois (ICD10-170.0)     REVIEW OF SYSTEMS:   Constitutional: Denies fevers, chills or abnormal weight loss Eyes: Denies blurriness of vision Ears, nose, mouth, throat, and face: Denies mucositis or sore throat Respiratory: Denies cough, dyspnea or wheezes Cardiovascular: Denies palpitation, chest discomfort or lower extremity swelling Gastrointestinal:  Denies nausea, heartburn  or change in bowel habits Skin: Denies abnormal skin rashes Neurological:Denies numbness, tingling or new weaknesses Behavioral/Psych: Mood is stable, no new changes  All other systems were reviewed with the patient and are negative.  I have reviewed the  past medical history, past surgical history, social history and family history with the patient and they are unchanged from previous note.  ALLERGIES:  is allergic to tramadol hcl.  MEDICATIONS:  Current Outpatient Medications  Medication Sig Dispense Refill  . acetaminophen (TYLENOL) 500 MG tablet Take 2 tablets (1,000 mg total) by mouth every 6 (six) hours. (Patient taking differently: Take 1,000 mg by mouth every 6 (six) hours as needed. ) 30 tablet 0  . ALPRAZolam (XANAX) 0.5 MG tablet TAKE 1/2 TO 1 (ONE-HALF TO ONE) TABLET BY MOUTH ONCE DAILY AS NEEDED FOR ANXIETY 30 tablet 2  . alum & mag hydroxide-simeth (MAALOX/MYLANTA) 200-200-20 MG/5ML suspension Take 30 mLs by mouth every 6 (six) hours as needed for indigestion or heartburn. 355 mL 0  . Artificial Saliva (SALIVASURE) LOZG Use as directed 1 lozenge in the mouth or throat every 4 (four) hours as needed. 90 lozenge 0  . Calcium Carbonate (CALTRATE 600) 1500 MG TABS Take 600 mg of elemental calcium by mouth daily.     . cholecalciferol (VITAMIN D) 1000 UNITS tablet Take 1,000 Units by mouth daily.     Marland Kitchen estradiol (ESTRACE) 0.1 MG/GM vaginal cream Place 1 Applicatorful vaginally 3 (three) times a week. 42.5 g 12  . fluticasone (FLONASE) 50 MCG/ACT nasal spray Place 2 sprays into both nostrils daily. 48 g 2  . gabapentin (NEURONTIN) 300 MG capsule Take 1 capsule (300 mg total) by mouth 2 (two) times daily. 60 capsule 11  . HYDROmorphone (DILAUDID) 2 MG tablet Take 1 tablet (2 mg total) by mouth every 4 (four) hours as needed for severe pain. 30 tablet 0  . ibuprofen (ADVIL,MOTRIN) 200 MG tablet Take 600 mg by mouth every 6 (six) hours as needed for moderate pain.     Marland Kitchen lactose free nutrition (BOOST PLUS) LIQD Take 237 mLs by mouth daily. (Patient taking differently: Take 237 mLs by mouth 2 (two) times daily between meals. ) 10 Can 0  . levothyroxine (SYNTHROID) 75 MCG tablet Take 1 tablet (75 mcg total) by mouth daily. 90 tablet 3  .  lidocaine-prilocaine (EMLA) cream Apply to affected area once 30 g 3  . Magnesium 400 MG TABS Take 250 mg by mouth daily.    . Menthol-Methyl Salicylate (MUSCLE RUB) 10-15 % CREA Apply 1 application topically as needed for muscle pain.    . metoprolol succinate (TOPROL-XL) 50 MG 24 hr tablet Take 1 tablet (50 mg total) by mouth daily. Take with or immediately following a meal. 90 tablet 2  . Multiple Vitamins-Minerals (MULTIVITAMIN WITH MINERALS) tablet Take 1 tablet by mouth daily.      Marland Kitchen nystatin (MYCOSTATIN) 100000 UNIT/ML suspension Take 5 mLs (500,000 Units total) by mouth 4 (four) times daily. 473 mL 0  . Omega-3 Fatty Acids (FISH OIL) 1000 MG CAPS Take 2,000 mg by mouth 2 (two) times daily.     Marland Kitchen omeprazole (PRILOSEC) 40 MG capsule Take 1 capsule (40 mg total) by mouth daily. 90 capsule 3  . ondansetron (ZOFRAN) 8 MG tablet Take 1 tablet (8 mg total) by mouth every 8 (eight) hours as needed (Nausea or vomiting). 30 tablet 1  . polyethylene glycol (MIRALAX / GLYCOLAX) packet Take 17 g by mouth daily. 14 each 0  .  prochlorperazine (COMPAZINE) 10 MG tablet Take 1 tablet (10 mg total) by mouth every 6 (six) hours as needed (Nausea or vomiting). 30 tablet 1  . senna (SENOKOT) 8.6 MG TABS tablet Take 1 tablet (8.6 mg total) by mouth at bedtime as needed for mild constipation. 120 each 0  . simvastatin (ZOCOR) 20 MG tablet Take 1 tablet (20 mg total) by mouth at bedtime. 90 tablet 1  . vitamin C (ASCORBIC ACID) 500 MG tablet Take 1,000 mg by mouth daily.    [name] 20 MG TABS tablet TAKE 1 TABLET BY MOUTH ONCE DAILY WITH  SUPPER 90 tablet 1   No current facility-administered medications for this visit.    PHYSICAL EXAMINATION: ECOG PERFORMANCE STATUS: 1 - Symptomatic but completely ambulatory  Vitals:   03/26/19 1025  BP: (!) 147/61  Pulse: (!) 51  Resp: 18  Temp: 99.1 F (37.3 C)  SpO2: 100%   Filed Weights   03/26/19 1025  Weight: 145 lb 3.2 oz (65.9 kg)    GENERAL:alert, no  distress and comfortable SKIN: skin color, texture, turgor are normal, no rashes or significant lesions EYES: normal, Conjunctiva are pink and non-injected, sclera clear OROPHARYNX:no exudate, no erythema and lips, buccal mucosa, and tongue normal  NECK: supple, thyroid normal size, non-tender, without nodularity LYMPH: She has palpable lymphadenopathy on the left axilla LUNGS: clear to auscultation and percussion with normal breathing effort HEART: regular rate & rhythm and no murmurs and no lower extremity edema ABDOMEN:abdomen soft, non-tender and normal bowel sounds.  Her abdomen looks slightly distended Musculoskeletal:no cyanosis of digits and no clubbing  NEURO: alert & oriented x 3 with fluent speech, no focal motor/sensory deficits  LABORATORY DATA:  I have reviewed the data as listed    Component Value Date/Time   NA 141 03/26/2019 0935   K 3.6 03/26/2019 0935   CL 106 03/26/2019 0935   CO2 26 03/26/2019 0935   GLUCOSE 100 (H) 03/26/2019 0935   BUN 11 03/26/2019 0935   CREATININE 0.76 03/26/2019 0935   CREATININE 0.76 11/17/2015 1005   CALCIUM 8.7 (L) 03/26/2019 0935   PROT 6.5 03/26/2019 0935   ALBUMIN 3.3 (L) 03/26/2019 0935   AST 14 (L) 03/26/2019 0935   ALT 11 03/26/2019 0935   ALKPHOS 78 03/26/2019 0935   BILITOT 0.3 03/26/2019 0935   GFRNONAA >60 03/26/2019 0935   GFRAA >60 03/26/2019 0935    No results found for: SPEP, UPEP  Lab Results  Component Value Date   WBC 7.0 03/26/2019   NEUTROABS 3.8 03/26/2019   HGB 12.0 03/26/2019   HCT 37.4 03/26/2019   MCV 96.4 03/26/2019   PLT 170 03/26/2019      Chemistry      Component Value Date/Time   NA 141 03/26/2019 0935   K 3.6 03/26/2019 0935   CL 106 03/26/2019 0935   CO2 26 03/26/2019 0935   BUN 11 03/26/2019 0935   CREATININE 0.76 03/26/2019 0935   CREATININE 0.76 11/17/2015 1005      Component Value Date/Time   CALCIUM 8.7 (L) 03/26/2019 0935   ALKPHOS 78 03/26/2019 0935   AST 14 (L) 03/26/2019  0935   ALT 11 03/26/2019 0935   BILITOT 0.3 03/26/2019 0935

## 2019-03-26 NOTE — Telephone Encounter (Signed)
Called and left below message. Ask her to call the office back. 

## 2019-03-26 NOTE — Telephone Encounter (Signed)
Scheduled per 3/16 sch msg. Called and left a msg. Mailing printout  

## 2019-03-26 NOTE — Assessment & Plan Note (Signed)
The left axillary lymph node is new I am concerned about metastatic disease I will order CT scan of the chest for evaluation and she agreed

## 2019-03-27 ENCOUNTER — Telehealth: Payer: Self-pay | Admitting: Oncology

## 2019-03-27 ENCOUNTER — Telehealth: Payer: Self-pay

## 2019-03-27 NOTE — Telephone Encounter (Signed)
Haley [name] called back and asked how many family members she can bring to the 3/23 appointment.  She would like to bring her husband, son and daughter.  Advised her that the [place] is only letting 1 family member attend but that I will check with Dr. Alvy Bimler and give her a call back tomorrow.

## 2019-03-27 NOTE — Telephone Encounter (Signed)
I would allow patient plus 2 family member

## 2019-03-27 NOTE — Telephone Encounter (Signed)
Spoke with pt by phone.  She states she has spoken with someone at [facility] regarding her CT scan appt.  This RN confirmed that pt has CT scan scheduled at [facility] on 3/23 at 0930. This RN scheduled appt for pt to see Dr Alvy Bimler at 1230 on 3/23 to review CT scan results. Pt verbalizes understanding of appt date and times.

## 2019-03-27 NOTE — Telephone Encounter (Signed)
Dejanae said she called [facility] and was told only the CT abd/pelvis was scheduled, not the CT chest.  She was also checking to see if the [place] and MM on 04/03/19 will be canceled.  Advised her that I will find about canceling her appointments on 04/03/19.  Also called central scheduling and verified that the CT chest/abd/pelvis are all scheduled for 04/02/19.

## 2019-03-28 ENCOUNTER — Telehealth: Payer: Self-pay | Admitting: Oncology

## 2019-03-28 NOTE — Telephone Encounter (Signed)
Left a message advising her that she can bring two family members to her appointment next week.

## 2019-04-01 ENCOUNTER — Telehealth: Payer: Self-pay

## 2019-04-01 NOTE — Telephone Encounter (Signed)
Called and left a message asking her to call the office back regarding call to the after hours number on 3/19.

## 2019-04-02 ENCOUNTER — Encounter: Payer: Self-pay | Admitting: Hematology and Oncology

## 2019-04-02 ENCOUNTER — Inpatient Hospital Stay (HOSPITAL_BASED_OUTPATIENT_CLINIC_OR_DEPARTMENT_OTHER): Payer: PPO | Admitting: Hematology and Oncology

## 2019-04-02 ENCOUNTER — Ambulatory Visit: Payer: PPO | Admitting: Hematology and Oncology

## 2019-04-02 ENCOUNTER — Telehealth: Payer: Self-pay | Admitting: Oncology

## 2019-04-02 ENCOUNTER — Ambulatory Visit (HOSPITAL_COMMUNITY)
Admission: RE | Admit: 2019-04-02 | Discharge: 2019-04-02 | Disposition: A | Discharge status: Home / Self Care | Payer: PPO | Source: Ambulatory Visit | Attending: Hematology and Oncology | Admitting: Hematology and Oncology

## 2019-04-02 ENCOUNTER — Other Ambulatory Visit: Payer: Self-pay

## 2019-04-02 DIAGNOSIS — R590 Localized enlarged lymph nodes: Secondary | ICD-10-CM | POA: Diagnosis not present

## 2019-04-02 DIAGNOSIS — Z7189 Other specified counseling: Secondary | ICD-10-CM

## 2019-04-02 DIAGNOSIS — C561 Malignant neoplasm of right ovary: Secondary | ICD-10-CM

## 2019-04-02 DIAGNOSIS — Z8543 Personal history of malignant neoplasm of ovary: Secondary | ICD-10-CM | POA: Diagnosis not present

## 2019-04-02 DIAGNOSIS — I10 Essential (primary) hypertension: Secondary | ICD-10-CM | POA: Diagnosis not present

## 2019-04-02 MED ORDER — IOHEXOL 300 MG/ML  SOLN
80.0000 mL | Freq: Once | INTRAMUSCULAR | Status: AC | PRN
  Administered 2019-04-02: 80 mL via INTRAVENOUS

## 2019-04-02 NOTE — Telephone Encounter (Signed)
Haley [name] called and verified that she can bring two family members to her appointment.  Asked her how she is feeling due her after hours call this weekend.  She said she was really bloated and couldn't button her jeans.  She was advised to go to the ER or urgent care but she refused.  She did talk to a [place] who advised her to take Gas-x which did help a little.  She also took Miralax twice and laxatives twice yesterday which "cleaned her out" and she feels better today and was able to button her jeans again.

## 2019-04-02 NOTE — Assessment & Plan Note (Signed)
She has multiple enlarged lymph nodes noted on the left axilla It is rather unusual to see presentation of recurrent ovarian cancer with relapse in the axillary lymph node Diagnosis of breast cancer or lymphoma cannot be excluded She is scheduled for diagnostic imaging as well as possible ultrasound-guided biopsy of the axilla and the breast imaging center and I encouraged her to keep her appointment as scheduled I will follow on results of the biopsy Reactive lymphadenopathy from other causes can be also distinct possibilities According to the patient, she have read on the Internet that gabapentin can cause reactive lymphadenopathy which I find that a little unusual For now, as above, I encouraged the patient to pursue biopsy

## 2019-04-02 NOTE — Assessment & Plan Note (Signed)
Her blood pressure fluctuates intermittently It is somewhat high today likely secondary to anxiety Recommend close observation for now

## 2019-04-02 NOTE — Assessment & Plan Note (Signed)
I discussed the plan of care with the patient, her husband and her daughter We discussed the rationale of why biopsy is necessary to establish diagnosis given the unusual presentation of axillary lymphadenopathy seen on CT imaging The patient is somewhat reluctant to pursue this but after extensive explanation, she is willing to proceed with her appointment as scheduled tomorrow

## 2019-04-02 NOTE — Assessment & Plan Note (Signed)
She has symptoms of abdominal bloating and palpable lymphadenopathy in the left axilla, worrisome for metastatic disease Surprisingly, CT imaging study showed no abdominal disease Her abdominal bloating cannot be explained except for her constipation The lymphadenopathy seen is not consistent with ovarian cancer recurrence I recommend she proceed with biopsy

## 2019-04-02 NOTE — Progress Notes (Signed)
[place] OFFICE PROGRESS NOTE  Patient Care Team: Martinique, Larsen G, MD as PCP - General (Family Medicine) Troy Sine, MD as PCP - Cardiology (Cardiology) Awanda Mink Craige Cotta, RN as Oncology Nurse Navigator (Oncology)  ASSESSMENT & PLAN:  Right ovarian epithelial cancer [facility]) She has symptoms of abdominal bloating and palpable lymphadenopathy in the left axilla, worrisome for metastatic disease Surprisingly, CT imaging study showed no abdominal disease Her abdominal bloating cannot be explained except for her constipation The lymphadenopathy seen is not consistent with ovarian cancer recurrence I recommend she proceed with biopsy  Lymphadenopathy, axillary She has multiple enlarged lymph nodes noted on the left axilla It is rather unusual to see presentation of recurrent ovarian cancer with relapse in the axillary lymph node Diagnosis of breast cancer or lymphoma cannot be excluded She is scheduled for diagnostic imaging as well as possible ultrasound-guided biopsy of the axilla and the breast imaging center and I encouraged her to keep her appointment as scheduled I will follow on results of the biopsy Reactive lymphadenopathy from other causes can be also distinct possibilities According to the patient, she have read on the Internet that gabapentin can cause reactive lymphadenopathy which I find that a little unusual For now, as above, I encouraged the patient to pursue biopsy  Essential hypertension, benign Her blood pressure fluctuates intermittently It is somewhat high today likely secondary to anxiety Recommend close observation for now  Goals of care, counseling/discussion I discussed the plan of care with the patient, her husband and her daughter We discussed the rationale of why biopsy is necessary to establish diagnosis given the unusual presentation of axillary lymphadenopathy seen on CT imaging The patient is somewhat reluctant to pursue this but after  extensive explanation, she is willing to proceed with her appointment as scheduled tomorrow   No orders of the defined types were placed in this encounter.   All questions were answered. The patient knows to call the clinic with any problems, questions or concerns. The total time spent in the appointment was 30 minutes encounter with patients including review of chart and various tests results, discussions about plan of care and coordination of care plan   Heath Lark, MD 04/02/2019 1:07 PM  INTERVAL HISTORY: Please see below for problem oriented charting. She returns with her husband and daughter to review test results Since CT imaging this morning, she had some loose stool She called recently because of abdominal bloating but that has resolved She shares with me she have done some research and read on the Internet that gabapentin can cause lymphadenopathy She denies pain The patient and multiple family members have question related to the diagnosis and treatment  SUMMARY OF ONCOLOGIC HISTORY: Oncology History Overview Note  Neg for genetics blood work and HRD   Right ovarian epithelial cancer ([place])  01/06/2018 Imaging   Ct abdomen and pelvis 1. Complex cystic mass at the right adnexa, measuring 5.9 x 4.0 cm, with nodular components, concerning for primary ovarian malignancy. 2. Diffuse nodularity along the omentum at the left side of the abdomen, extending into the mesentery at the left mid abdomen, concerning for peritoneal carcinomatosis. 3. Wall thickening at the distal ileum adjacent to the ovarian mass; bowel loops appear somewhat adherent to the ovarian mass. Bowel infiltration with tumor cannot be excluded. No evidence of bowel obstruction at this time. 4. Small volume ascites within the abdomen and pelvis.  Aortic Atherosclerosis (ICD10-I70.0).   01/08/2018 Tumor Marker   Patient's tumor was  tested for the following markers: CA-125. Results of the tumor marker test  revealed 3004   01/15/2018 Imaging   Chest CT:  1. No active cardiopulmonary disease. 2. Aortic atherosclerosis without aneurysm or dissection. 3. No large central pulmonary embolus.  CT AP:  1. Dilated fluid-filled loops of small bowel are redemonstrated slightly more extensive than on prior exam with transition point likely in the right adnexa adjacent to a complex cystic mass concerning for ovarian neoplasm given septations and soft tissue nodularity. This raises concern for early or partial SBO. This soft tissue mass measures 4.9 x 4 x 4.7 cm and has not changed since prior recent comparison. Additional short segmental area of luminal narrowing is noted in the right lower quadrant involving small bowel for which stigmata of peritoneal carcinomatosis or small-bowel metastatic implants might account for this. 2. Redemonstration of small volume of ascites predominantly in the upper abdomen surrounding the liver and spleen. 3. Redemonstration of thick bandlike omental thickening concerning for peritoneal carcinomatosis.    01/15/2018 Procedure   Successful ultrasound-guided diagnostic and therapeutic paracentesis yielding 1.5 liters of peritoneal fluid.    01/15/2018 Pathology Results   PERITONEAL/ASCITIC FLUID (SPECIMEN 1 OF 1 COLLECTED 01/18/18): MALIGNANT CELLS CONSISTENT WITH METASTATIC ADENOCARCINOMA. SEE COMMENT. COMMENT: THE MALIGNANT CELLS ARE POSITIVE FOR MOC-31, CYTOKERATIN 7, ESTROGEN RECEPTOR, PAX-8, AND WT-1. THEY ARE NEGATIVE FOR CALRETININ, CYTOKERATIN 5/6, AND CYTOKERATIN 20. THE PROFILE IS CONSISTENT WITH A PRIMARY GYNECOLOGIC CARCINOMA. THERE IS LIKELY SUFFICIENT TUMOR PRESENT, IF ADDITIONAL STUDIES ARE REQUESTED.   01/15/2018 - 02/02/2018 Hospital Admission   She was admitted to the hospital for SBO. She was treated with chemotherapy   01/18/2018 - 06/20/2018 Chemotherapy   The patient had carboplatin and taxol   02/08/2018 Cancer Staging   Staging form: Ovary, Fallopian  Tube, and Primary Peritoneal Carcinoma, AJCC 8th Edition - Clinical: cT3, cN0, cM0 - Signed by Heath Lark, MD on 02/08/2018   02/08/2018 Tumor Marker   Patient's tumor was tested for the following markers: CA-125. Results of the tumor marker test revealed 445   03/02/2018 Tumor Marker   Patient's tumor was tested for the following markers: CA-125. Results of the tumor marker test revealed 174   03/15/2018 Imaging   6.2 cm complex cystic right ovarian mass, compatible with malignant ovarian neoplasm, mildly progressive.  Associated peritoneal disease/omental caking beneath the anterior abdominal wall, mildly improved. Prior abdominal ascites is improved/resolved.  4.7 cm cystic left ovarian mass, without overt malignant features, grossly unchanged.  Mild bilateral hydroureteronephrosis, secondary to extrinsic compression, new.  Prior small bowel obstruction has improved/resolved.    03/27/2018 Pathology Results   1. Omentum, resection for tumor - OMENTUM: - HIGH GRADE SEROUS CARCINOMA. 2. Ovary, left - LEFT OVARY: - HIGH GRADE SEROUS CARCINOMA. - LEFT FALLOPIAN TUBE: - HIGH GRADE SEROUS CARCINOMA. 3. Adnexa - ovary +/- tube, neoplastic, right - RIGHT OVARY: - HIGH GRADE SEROUS CARCINOMA, 5.5 CM. - RIGHT FALLOPIAN TUBE: - HIGH GRADE SEROUS CARCINOMA. 4. Peritoneum, biopsy, nodule - PERITONEUM: - NODULES OF HIGH GRADE SEROUS CARCINOMA. 5. Peritoneum, resection for tumor, rectosigmoid - RECTOSIGMOID PERITONEUM: - HIGH GRADE SEROUS CARCINOMA. Microscopic Comment 3. OVARY or FALLOPIAN TUBE or PRIMARY PERITONEUM: Procedure: Bilateral salpingo-oophorectomy, omentectomy and peritoneal biopsies. Specimen Integrity: N/A. Tumor Site: Right ovary. Ovarian Surface Involvement (required only if applicable): Yes. Fallopian Tube Surface Involvement (required only if applicable): Yes. Tumor Size: 5.5 x 4.8 x 4.0 cm. Histologic Type: Serous carcinoma. Histologic Grade: High  grade. Implants (required for advanced stage  serous/seromucinous borderline tumors only): Omentum, left ovary and fallopian tube, rectosigmoid peritoneum and peritoneum. Other Tissue/ Organ Involvement: Left ovary and fallopian tube, omentum and rectosigmoid peritoneum. Largest Extrapelvic Peritoneal Focus (required only if applicable): 49.7 cm, omentum. Peritoneal/Ascitic Fluid: N/A. Treatment Effect (required only for high-grade serous carcinomas): Minimal. Regional Lymph Nodes: No lymph nodes submitted. Pathologic Stage Classification (pTNM, AJCC 8th Edition): pT3c, pNX. Representative Tumor Block: 3A, 3B, 3D, 3E and 69F. Comment(s): There is a 5.5 cm in greatest dimension partially cystic high grade serous carcinoma involving the right adnexal specimen and the tumor is staged as a primary right ovarian carcinoma. The carcinoma also involves the left ovary as well as the left fallopian tube, the omentum, peritoneum and the rectosigmoid peritoneal specimens.   03/27/2018 Surgery   Preoperative Diagnosis: ovarian cancer, stage IIIC, metastatic to omentum, peritoneum, serosa of intestines   Procedure(s) Performed: 1. Exploratory laparotomy with bilateral salpingo-oophorectomy, omentectomy radical tumor debulking for ovarian cancer .  Surgeon: Thereasa Solo, MD.   Specimens: Bilateral tubes / ovaries, omentum. Peritoneal nodules, rectosigmoid nodules.    Operative Findings: omental cake, miliary studding on diaphragm and bilateral paracolic gutters. Sigmoid colon densely adherent to left and right ovaries with tumor rind, ureters mildly dilated bilaterally due to retroperitoneal extension of the tumor towards ureters causing compression.    This represented an optimal cytoreduction (R1) with gross residual disease on the diaphragm that had been ablated and a thin tumor plaque on the sigmoid colon that was ablated. No residual disease >1cm remaining   05/07/2018 Genetic Testing   Negative  genetic testing on the [employer] TumorNextHRD+ CancerNext Panel. The CancerNext gene panel offered by [employer] includes sequencing and rearrangement analysis for the following 34 genes:   APC, ATM, BARD1, BMPR1A, BRCA1, BRCA2, BRIP1, CDH1, CDK4, CDKN2A, CHEK2, DICER1, EPCAM, GREM1, HOXB13, MLH1, MRE11A, MSH2, MSH6, MUTYH, NBN, NF1, PALB2, PMS2, POLD1, POLE, PTEN, RAD50, RAD51C, RAD51D, SMAD4, SMARCA4, STK11, and TP53. Somatic genes analyzed through TumorNext-HRD: ATM, BARD1, BRCA1, BRCA2, BRIP1, CHEK2, MRE11A, NBN, PALB2, RAD51C, RAD51D. The report date is 05/07/2018.   05/11/2018 Tumor Marker   Patient's tumor was tested for the following markers: CA-125. Results of the tumor marker test revealed 60.5   07/02/2018 Imaging   1. Mild right pericardiophrenic adenopathy is mildly increased, suspicious for metastatic nodes. No abdominopelvic adenopathy. 2. Small left peritoneal soft tissue nodule adjacent to the splenic flexure and smooth left pelvic sidewall peritoneal thickening, cannot exclude residual/recurrent disease. Attention on follow-up CT advised. 3. Bilateral renal collecting system dilatation has improved. 4.  Aortic Atherosclerosis (ICD10-I70.0).   07/02/2018 Tumor Marker   Patient's tumor was tested for the following markers: CA-125 Results of the tumor marker test revealed 42.7   07/11/2018 Echocardiogram   1. The left ventricle has normal systolic function with an ejection fraction of 60-65%. The cavity size was normal. Left ventricular diastolic parameters were normal.  2. Normal GLS -20.1.  3. The right ventricle has normal systolic function. The cavity was normal. There is no increase in right ventricular wall thickness.  4. The mitral valve is degenerative. Moderate thickening of the mitral valve leaflet. Moderate calcification of the mitral valve leaflet.  5. The aortic valve is tricuspid. Moderate thickening of the aortic valve. Moderate calcification of the aortic valve. Aortic  valve regurgitation is trivial by color flow Doppler.  6. The aortic root is normal in size and structure.    Chemotherapy   The patient had doxorubicin and bevacizumab for chemotherapy treatment.  07/30/2018 Tumor Marker   Patient's tumor was tested for the following markers: CA-125 Results of the tumor marker test revealed 82.6   08/27/2018 Tumor Marker   Patient's tumor was tested for the following markers: CA-125 Results of the tumor marker test revealed 111.   09/24/2018 Tumor Marker   Patient's tumor was tested for the following markers: CA-125 Results of the tumor marker test revealed 142.   10/05/2018 Imaging   1. Stable right juxta diaphragmatic/pericardiophrenic lymph node, mildly enlarged. 2. Otherwise no definite metastatic disease identified in the abdomen/pelvis. 3. 3 mm peritoneal nodule noted left paracolic gutter on today's study, indeterminate. Attention on follow-up recommended. 4.  Aortic Atherosclerois (ICD10-170.0)   10/16/2018 Echocardiogram    1. No significant change from prior study (07/11/2018).  2. Left ventricular ejection fraction, by visual estimation, is 60 to 65%. The left ventricle has normal function. Normal left ventricular size. There is no left ventricular hypertrophy.  3. LVEF by 3D assessment 63%.  4. The average left ventricular global longitudinal strain is -22.4 %.  5. Global right ventricle has normal systolic function.The right ventricular size is normal. No increase in right ventricular wall thickness.  6. Left atrial size was normal.  7. Right atrial size was normal.  8. Presence of pericardial fat pad.  9. Moderate thickening of the mitral valve leaflet(s). 10. The mitral valve is normal in structure. Trace mitral valve regurgitation. 11. The tricuspid valve is normal in structure. Tricuspid valve regurgitation is mild. 12. Aortic regurgitation PHT measures 467 msec. 13. The aortic valve is tricuspid Aortic valve regurgitation is mild  by color flow Doppler. Mild aortic valve sclerosis without stenosis. 14. There is mild to moderate calcification of the AoV, with focal calcification of the Braggs. The aortic regurgitation is mild in severity, likely related to degenerative valve disease. 15. The pulmonic valve was grossly normal. Pulmonic valve regurgitation is not visualized by color flow Doppler. 16. Mild plaque invoving the ascending aorta. 17. Normal pulmonary artery systolic pressure. 18. The tricuspid regurgitant velocity is 2.52 m/s, and with an assumed right atrial pressure of 3 mmHg, the estimated right ventricular systolic pressure is normal at 28.4 mmHg.   10/26/2018 Tumor Marker   Patient's tumor was tested for the following markers: CA-125 Results of the tumor marker test revealed 196   12/11/2018 Imaging   1. No substantial interval change in exam. No definite findings to suggest recurrent/metastatic disease. 2. Right pericardial phrenic lymph node noted on multiple prior studies is unchanged in the interval. 3. 8 mm omental nodule identified on today's exam. Close attention on follow-up recommended. 3 mm soft tissue nodule along the left paracolic gutter seen previously has resolved in the interval. 4.  Aortic Atherosclerois (ICD10-170.0)   04/02/2019 Imaging   1. Interval development of left axillary and subpectoral lymphadenopathy is highly concerning for metastatic disease, however, this would be a highly unusual pattern of metastatic spread for an ovarian primary neoplasm. This is most concerning for potential left-sided breast cancer. Correlation with mammography is strongly recommended. 2. No other signs of definite metastatic disease noted elsewhere in the chest, abdomen or pelvis. 3. Aortic atherosclerosis. 4. Additional incidental findings, similar to prior studies, as above.       REVIEW OF SYSTEMS:   Constitutional: Denies fevers, chills or abnormal weight loss Eyes: Denies blurriness of  vision Ears, nose, mouth, throat, and face: Denies mucositis or sore throat Respiratory: Denies cough, dyspnea or wheezes Cardiovascular: Denies palpitation, chest discomfort or lower extremity  swelling Skin: Denies abnormal skin rashes Neurological:Denies numbness, tingling or new weaknesses Behavioral/Psych: Mood is stable, no new changes  All other systems were reviewed with the patient and are negative.  I have reviewed the past medical history, past surgical history, social history and family history with the patient and they are unchanged from previous note.  ALLERGIES:  is allergic to tramadol hcl.  MEDICATIONS:  Current Outpatient Medications  Medication Sig Dispense Refill  . acetaminophen (TYLENOL) 500 MG tablet Take 2 tablets (1,000 mg total) by mouth every 6 (six) hours. (Patient taking differently: Take 1,000 mg by mouth every 6 (six) hours as needed. ) 30 tablet 0  . ALPRAZolam (XANAX) 0.5 MG tablet TAKE 1/2 TO 1 (ONE-HALF TO ONE) TABLET BY MOUTH ONCE DAILY AS NEEDED FOR ANXIETY 30 tablet 2  . alum & mag hydroxide-simeth (MAALOX/MYLANTA) 200-200-20 MG/5ML suspension Take 30 mLs by mouth every 6 (six) hours as needed for indigestion or heartburn. 355 mL 0  . Artificial Saliva (SALIVASURE) LOZG Use as directed 1 lozenge in the mouth or throat every 4 (four) hours as needed. 90 lozenge 0  . Calcium Carbonate (CALTRATE 600) 1500 MG TABS Take 600 mg of elemental calcium by mouth daily.     . cholecalciferol (VITAMIN D) 1000 UNITS tablet Take 1,000 Units by mouth daily.     Marland Kitchen estradiol (ESTRACE) 0.1 MG/GM vaginal cream Place 1 Applicatorful vaginally 3 (three) times a week. 42.5 g 12  . fluticasone (FLONASE) 50 MCG/ACT nasal spray Place 2 sprays into both nostrils daily. 48 g 2  . gabapentin (NEURONTIN) 300 MG capsule Take 1 capsule (300 mg total) by mouth 2 (two) times daily. 60 capsule 11  . HYDROmorphone (DILAUDID) 2 MG tablet Take 1 tablet (2 mg total) by mouth every 4 (four)  hours as needed for severe pain. 30 tablet 0  . ibuprofen (ADVIL,MOTRIN) 200 MG tablet Take 600 mg by mouth every 6 (six) hours as needed for moderate pain.     Marland Kitchen lactose free nutrition (BOOST PLUS) LIQD Take 237 mLs by mouth daily. (Patient taking differently: Take 237 mLs by mouth 2 (two) times daily between meals. ) 10 Can 0  . levothyroxine (SYNTHROID) 75 MCG tablet Take 1 tablet (75 mcg total) by mouth daily. 90 tablet 3  . lidocaine-prilocaine (EMLA) cream Apply to affected area once 30 g 3  . Magnesium 400 MG TABS Take 250 mg by mouth daily.    . Menthol-Methyl Salicylate (MUSCLE RUB) 10-15 % CREA Apply 1 application topically as needed for muscle pain.    . metoprolol succinate (TOPROL-XL) 50 MG 24 hr tablet Take 1 tablet (50 mg total) by mouth daily. Take with or immediately following a meal. 90 tablet 2  . Multiple Vitamins-Minerals (MULTIVITAMIN WITH MINERALS) tablet Take 1 tablet by mouth daily.      Marland Kitchen nystatin (MYCOSTATIN) 100000 UNIT/ML suspension Take 5 mLs (500,000 Units total) by mouth 4 (four) times daily. 473 mL 0  . Omega-3 Fatty Acids (FISH OIL) 1000 MG CAPS Take 2,000 mg by mouth 2 (two) times daily.     Marland Kitchen omeprazole (PRILOSEC) 40 MG capsule Take 1 capsule (40 mg total) by mouth daily. 90 capsule 3  . ondansetron (ZOFRAN) 8 MG tablet Take 1 tablet (8 mg total) by mouth every 8 (eight) hours as needed (Nausea or vomiting). 30 tablet 1  . polyethylene glycol (MIRALAX / GLYCOLAX) packet Take 17 g by mouth daily. 14 each 0  . prochlorperazine (COMPAZINE) 10 MG  tablet Take 1 tablet (10 mg total) by mouth every 6 (six) hours as needed (Nausea or vomiting). 30 tablet 1  . senna (SENOKOT) 8.6 MG TABS tablet Take 1 tablet (8.6 mg total) by mouth at bedtime as needed for mild constipation. 120 each 0  . simvastatin (ZOCOR) 20 MG tablet Take 1 tablet (20 mg total) by mouth at bedtime. 90 tablet 1  . vitamin C (ASCORBIC ACID) 500 MG tablet Take 1,000 mg by mouth daily.    [name] 20 MG  TABS tablet TAKE 1 TABLET BY MOUTH ONCE DAILY WITH  SUPPER 90 tablet 1   No current facility-administered medications for this visit.    PHYSICAL EXAMINATION: ECOG PERFORMANCE STATUS: 1 - Symptomatic but completely ambulatory  Vitals:   04/02/19 1236  BP: (!) 161/80  Pulse: 67  Resp: 18  Temp: 98.7 F (37.1 C)  SpO2: 99%   Filed Weights   04/02/19 1236  Weight: 149 lb 12.8 oz (67.9 kg)    GENERAL:alert, no distress and comfortable NEURO: alert & oriented x 3 with fluent speech, no focal motor/sensory deficits  LABORATORY DATA:  I have reviewed the data as listed    Component Value Date/Time   NA 141 03/26/2019 0935   K 3.6 03/26/2019 0935   CL 106 03/26/2019 0935   CO2 26 03/26/2019 0935   GLUCOSE 100 (H) 03/26/2019 0935   BUN 11 03/26/2019 0935   CREATININE 0.76 03/26/2019 0935   CREATININE 0.76 11/17/2015 1005   CALCIUM 8.7 (L) 03/26/2019 0935   PROT 6.5 03/26/2019 0935   ALBUMIN 3.3 (L) 03/26/2019 0935   AST 14 (L) 03/26/2019 0935   ALT 11 03/26/2019 0935   ALKPHOS 78 03/26/2019 0935   BILITOT 0.3 03/26/2019 0935   GFRNONAA >60 03/26/2019 0935   GFRAA >60 03/26/2019 0935    No results found for: SPEP, UPEP  Lab Results  Component Value Date   WBC 7.0 03/26/2019   NEUTROABS 3.8 03/26/2019   HGB 12.0 03/26/2019   HCT 37.4 03/26/2019   MCV 96.4 03/26/2019   PLT 170 03/26/2019      Chemistry      Component Value Date/Time   NA 141 03/26/2019 0935   K 3.6 03/26/2019 0935   CL 106 03/26/2019 0935   CO2 26 03/26/2019 0935   BUN 11 03/26/2019 0935   CREATININE 0.76 03/26/2019 0935   CREATININE 0.76 11/17/2015 1005      Component Value Date/Time   CALCIUM 8.7 (L) 03/26/2019 0935   ALKPHOS 78 03/26/2019 0935   AST 14 (L) 03/26/2019 0935   ALT 11 03/26/2019 0935   BILITOT 0.3 03/26/2019 0935       RADIOGRAPHIC STUDIES: I have reviewed CT imaging in good detail with the patient and family I have personally reviewed the radiological images as  listed and agreed with the findings in the report. CT CHEST W CONTRAST  Result Date: 04/02/2019 CLINICAL DATA:  74 year old female with history of ovarian cancer. Staging examination. Palpable left axillary lymph node enlargement. EXAM: CT CHEST, ABDOMEN, AND PELVIS WITH CONTRAST TECHNIQUE: Multidetector CT imaging of the chest, abdomen and pelvis was performed following the standard protocol during bolus administration of intravenous contrast. CONTRAST:  47m OMNIPAQUE IOHEXOL 300 MG/ML  SOLN COMPARISON:  CT the abdomen and pelvis 12/11/2018. CT the chest, abdomen and pelvis 01/15/2018. FINDINGS: CT CHEST FINDINGS Cardiovascular: Heart size is normal. There is no significant pericardial fluid, thickening or pericardial calcification. Aortic atherosclerosis. No definite coronary artery calcifications.  Right internal jugular single-lumen porta cath with tip terminating in the distal superior vena cava. Mediastinum/Nodes: Borderline enlarged anterior mediastinal lymph node adjacent to the right atrioventricular groove (axial image 48 of series 3) currently measuring 1.1 cm in short axis, similar to recent prior examinations. No other pathologically enlarged mediastinal or hilar lymph nodes are noted. Esophagus is unremarkable in appearance. Numerous left axillary lymph nodes and left subpectoral lymph nodes appear increased in size compared to the prior examination. The largest left axillary lymph nodes measure up to 1.5 cm in short axis, while the largest subpectoral lymph node measures up to 1.1 cm in short axis. Lungs/Pleura: No suspicious pulmonary nodules or masses are noted. No acute consolidative airspace disease. No pleural effusions. Musculoskeletal: There are no aggressive appearing lytic or blastic lesions noted in the visualized portions of the skeleton. CT ABDOMEN PELVIS FINDINGS Hepatobiliary: 1 cm low-attenuation lesion in segment 2 of the liver, similar to the prior study, compatible with a simple  cyst. No other suspicious hepatic lesions. No intra or extrahepatic biliary ductal dilatation. Gallbladder is normal in appearance. Pancreas: No pancreatic mass. No pancreatic ductal dilatation. No pancreatic or peripancreatic fluid collections or inflammatory changes. Spleen: Unremarkable. Adrenals/Urinary Tract: Bilateral kidneys and bilateral adrenal glands are normal in appearance. No hydroureteronephrosis. Urinary bladder is normal in appearance. Stomach/Bowel: Normal appearance of the stomach. No pathologic dilatation of small bowel or colon. The appendix is not confidently identified and may be surgically absent. Regardless, there are no inflammatory changes noted adjacent to the cecum to suggest the presence of an acute appendicitis at this time. Vascular/Lymphatic: Aortic atherosclerosis, without evidence of aneurysm or dissection in the abdominal or pelvic vasculature. No lymphadenopathy noted in the abdomen or pelvis. Reproductive: Status post total abdominal hysterectomy and bilateral salpingo-oophorectomy. No unexpected soft tissue mass in the low anatomic pelvis to suggest locally recurrent disease. Other: Small ventral hernia containing only omental fat. No significant volume of ascites. No pneumoperitoneum. Musculoskeletal: There are no aggressive appearing lytic or blastic lesions noted in the visualized portions of the skeleton. IMPRESSION: 1. Interval development of left axillary and subpectoral lymphadenopathy is highly concerning for metastatic disease, however, this would be a highly unusual pattern of metastatic spread for an ovarian primary neoplasm. This is most concerning for potential left-sided breast cancer. Correlation with mammography is strongly recommended. 2. No other signs of definite metastatic disease noted elsewhere in the chest, abdomen or pelvis. 3. Aortic atherosclerosis. 4. Additional incidental findings, similar to prior studies, as above. Electronically Signed   By: Vinnie Langton M.D.   On: 04/02/2019 12:19   CT ABDOMEN PELVIS W CONTRAST  Result Date: 04/02/2019 CLINICAL DATA:  74 year old female with history of ovarian cancer. Staging examination. Palpable left axillary lymph node enlargement. EXAM: CT CHEST, ABDOMEN, AND PELVIS WITH CONTRAST TECHNIQUE: Multidetector CT imaging of the chest, abdomen and pelvis was performed following the standard protocol during bolus administration of intravenous contrast. CONTRAST:  17m OMNIPAQUE IOHEXOL 300 MG/ML  SOLN COMPARISON:  CT the abdomen and pelvis 12/11/2018. CT the chest, abdomen and pelvis 01/15/2018. FINDINGS: CT CHEST FINDINGS Cardiovascular: Heart size is normal. There is no significant pericardial fluid, thickening or pericardial calcification. Aortic atherosclerosis. No definite coronary artery calcifications. Right internal jugular single-lumen porta cath with tip terminating in the distal superior vena cava. Mediastinum/Nodes: Borderline enlarged anterior mediastinal lymph node adjacent to the right atrioventricular groove (axial image 48 of series 3) currently measuring 1.1 cm in short axis, similar to recent prior examinations. No  other pathologically enlarged mediastinal or hilar lymph nodes are noted. Esophagus is unremarkable in appearance. Numerous left axillary lymph nodes and left subpectoral lymph nodes appear increased in size compared to the prior examination. The largest left axillary lymph nodes measure up to 1.5 cm in short axis, while the largest subpectoral lymph node measures up to 1.1 cm in short axis. Lungs/Pleura: No suspicious pulmonary nodules or masses are noted. No acute consolidative airspace disease. No pleural effusions. Musculoskeletal: There are no aggressive appearing lytic or blastic lesions noted in the visualized portions of the skeleton. CT ABDOMEN PELVIS FINDINGS Hepatobiliary: 1 cm low-attenuation lesion in segment 2 of the liver, similar to the prior study, compatible with a simple  cyst. No other suspicious hepatic lesions. No intra or extrahepatic biliary ductal dilatation. Gallbladder is normal in appearance. Pancreas: No pancreatic mass. No pancreatic ductal dilatation. No pancreatic or peripancreatic fluid collections or inflammatory changes. Spleen: Unremarkable. Adrenals/Urinary Tract: Bilateral kidneys and bilateral adrenal glands are normal in appearance. No hydroureteronephrosis. Urinary bladder is normal in appearance. Stomach/Bowel: Normal appearance of the stomach. No pathologic dilatation of small bowel or colon. The appendix is not confidently identified and may be surgically absent. Regardless, there are no inflammatory changes noted adjacent to the cecum to suggest the presence of an acute appendicitis at this time. Vascular/Lymphatic: Aortic atherosclerosis, without evidence of aneurysm or dissection in the abdominal or pelvic vasculature. No lymphadenopathy noted in the abdomen or pelvis. Reproductive: Status post total abdominal hysterectomy and bilateral salpingo-oophorectomy. No unexpected soft tissue mass in the low anatomic pelvis to suggest locally recurrent disease. Other: Small ventral hernia containing only omental fat. No significant volume of ascites. No pneumoperitoneum. Musculoskeletal: There are no aggressive appearing lytic or blastic lesions noted in the visualized portions of the skeleton. IMPRESSION: 1. Interval development of left axillary and subpectoral lymphadenopathy is highly concerning for metastatic disease, however, this would be a highly unusual pattern of metastatic spread for an ovarian primary neoplasm. This is most concerning for potential left-sided breast cancer. Correlation with mammography is strongly recommended. 2. No other signs of definite metastatic disease noted elsewhere in the chest, abdomen or pelvis. 3. Aortic atherosclerosis. 4. Additional incidental findings, similar to prior studies, as above. Electronically Signed   By: Vinnie Langton M.D.   On: 04/02/2019 12:19

## 2019-04-03 ENCOUNTER — Ambulatory Visit
Admission: RE | Admit: 2019-04-03 | Discharge: 2019-04-03 | Disposition: A | Discharge status: Home / Self Care | Payer: PPO | Source: Ambulatory Visit | Attending: Family Medicine | Admitting: Family Medicine

## 2019-04-03 ENCOUNTER — Other Ambulatory Visit: Payer: Self-pay | Admitting: Family Medicine

## 2019-04-03 ENCOUNTER — Other Ambulatory Visit (HOSPITAL_COMMUNITY): Payer: Self-pay | Admitting: Diagnostic Radiology

## 2019-04-03 DIAGNOSIS — R922 Inconclusive mammogram: Secondary | ICD-10-CM | POA: Diagnosis not present

## 2019-04-03 DIAGNOSIS — R590 Localized enlarged lymph nodes: Secondary | ICD-10-CM

## 2019-04-03 DIAGNOSIS — N644 Mastodynia: Secondary | ICD-10-CM

## 2019-04-03 DIAGNOSIS — C801 Malignant (primary) neoplasm, unspecified: Secondary | ICD-10-CM | POA: Diagnosis not present

## 2019-04-03 DIAGNOSIS — C773 Secondary and unspecified malignant neoplasm of axilla and upper limb lymph nodes: Secondary | ICD-10-CM | POA: Diagnosis not present

## 2019-04-03 DIAGNOSIS — N6489 Other specified disorders of breast: Secondary | ICD-10-CM | POA: Diagnosis not present

## 2019-04-04 ENCOUNTER — Telehealth: Payer: Self-pay | Admitting: Oncology

## 2019-04-04 ENCOUNTER — Ambulatory Visit (HOSPITAL_COMMUNITY): Payer: PPO

## 2019-04-04 NOTE — Telephone Encounter (Signed)
No need MRI breast

## 2019-04-04 NOTE — Telephone Encounter (Signed)
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  She has called Haley [name] and let her know.  She said Dr. Martinique  is also wondering if Haley [name] would need a breast MRI.  Advised her that we will call Haley [name] tomorrow and that she will see Dr. Alvy Bimler on Tuesday.

## 2019-04-04 NOTE — Telephone Encounter (Signed)
Called Manuela Schwartz back at the [facility] and left her a message advising that Bekah does not need a breast MRI.

## 2019-04-05 ENCOUNTER — Telehealth: Payer: Self-pay | Admitting: Oncology

## 2019-04-05 NOTE — Telephone Encounter (Signed)
Haley [name] and scheduled appointment with Dr. Alvy Bimler on 04/09/19 at 10:15.  Discussed her biopsy results and that the final pathology is not back yet.  Also advised her that she can bring 2 family member to her appointment.  She verbalized understanding and agreement.

## 2019-04-09 ENCOUNTER — Telehealth: Payer: Self-pay | Admitting: Hematology and Oncology

## 2019-04-09 ENCOUNTER — Other Ambulatory Visit: Payer: Self-pay

## 2019-04-09 ENCOUNTER — Inpatient Hospital Stay (HOSPITAL_BASED_OUTPATIENT_CLINIC_OR_DEPARTMENT_OTHER): Payer: PPO | Admitting: Hematology and Oncology

## 2019-04-09 ENCOUNTER — Encounter: Payer: Self-pay | Admitting: Hematology and Oncology

## 2019-04-09 VITALS — BP 153/72 | HR 61 | Temp 98.9°F | Resp 18 | Ht 62.0 in | Wt 147.4 lb

## 2019-04-09 DIAGNOSIS — T451X5A Adverse effect of antineoplastic and immunosuppressive drugs, initial encounter: Secondary | ICD-10-CM | POA: Diagnosis not present

## 2019-04-09 DIAGNOSIS — Z7189 Other specified counseling: Secondary | ICD-10-CM | POA: Diagnosis not present

## 2019-04-09 DIAGNOSIS — G620 Drug-induced polyneuropathy: Secondary | ICD-10-CM

## 2019-04-09 DIAGNOSIS — C561 Malignant neoplasm of right ovary: Secondary | ICD-10-CM | POA: Diagnosis not present

## 2019-04-09 DIAGNOSIS — M79606 Pain in leg, unspecified: Secondary | ICD-10-CM

## 2019-04-09 NOTE — Progress Notes (Signed)
[place] OFFICE PROGRESS NOTE  Patient Care Team: Martinique, Cathyrn G, MD as PCP - General (Family Medicine) Troy Sine, MD as PCP - Cardiology (Cardiology) Awanda Mink Craige Cotta, RN as Oncology Nurse Navigator (Oncology)  ASSESSMENT & PLAN:  Right ovarian epithelial cancer [facility]) I reviewed the pathology report with the patient and family I will get pathologist to add PD-L1 testing We discussed the role of chemotherapy. The intent is of palliative intent.  We reviewed the NCCN guidelines.  The treatment decision is based on the following publication:  Randomized Phase III Trial of Gemcitabine Compared With Pegylated Liposomal Doxorubicin in Patients With Platinum-Resistant Ovarian Cancer Lorenso Quarry. Arlyss Queen Cameron, Santiago Glad Teneriello, Willette Alma, Scott D. McMeekin, Meredeth Ide, Veleta Miners, [facility], [place]. Evonnie Pat, and [employer] [place] A B S T R A C T Purpose Ovarian cancer (OC) patients experiencing progressive disease (PD) within 6 months of platinum based therapy in the primary setting are considered platinum resistant (Pt-R). Currently, pegylated liposomal doxorubicin (PLD) is a standard of care for treatment of recurrent Pt-R disease. On the basis of promising phase II results, gemcitabine was compared with PLD for efficacy and safety in taxane-pretreated Pt-R OC patients. Patients and Methods Patients (n  195) with Pt-R OC were randomly assigned to either gemcitabine 1,000 mg/m2 (days 1 and 8; every 21 days) or PLD 50 mg/m2 (day 1; every 28 days) until PD or undue toxicity. Optional cross-over therapy was allowed at PD or at withdrawal because of toxicity. Primary end point was progression-free survival (PFS). Additional end points included tumor response, time to treatment failure, survival, and quality of life. Results In the gemcitabine and PLD groups, median PFS was 3.6 v 3.1 months; median overall survival was 12.7 v  13.5 months; overall response rate (ORR) was 6.1% v 8.3%; and in the subset of patients with measurable disease, ORR was 9.2% v 11.7%, respectively. None of the efficacy end points showed a statistically significant difference between treatment groups. The PLD group experienced significantly more hand-foot syndrome and mucositis; the gemcitabine group experienced significantly more constipation, nausea/vomiting, fatigue, and neutropenia but not febrile neutropenia. Conclusion Although this was not designed as an equivalency study, gemcitabine and PLD seem to have a comparable therapeutic index in this population of Pt-R taxane-pretreated OC patients. Single agent gemcitabine may be an acceptable alternative to PLD for patients with platinum resistant ovarian cancer J Clin Oncol 25:2811-2818.  2007 by American Society of Clinical Oncology  I recommend treatment modifications to 800 mg per metered squared days 1, 8, and 15, every 28 days for better tolerability I will monitor closely the size of the lymph node on physical examination to track the progress We will repeat imaging study after 2-3 cycles   Peripheral neuropathy due to chemotherapy [facility]) She has mild peripheral neuropathy but not severe We will avoid taxanes for now  Goals of care, counseling/discussion We have extensive discussions about goals of care She understood that treatment is palliative in nature She understood why surgery and radiation are not indicated She has advanced directive and living will We discussed CODE STATUS She wants to be resuscitated but does not want to be maintained on life support in the event of terminal illness   Orders Placed This Encounter  Procedures  . CBC with Differential (Cancer Center Only)    Standing Status:   Standing    Number of Occurrences:   20    Standing Expiration  Date:   04/08/2020  . CMP ([place] only)    Standing Status:   Standing    Number of Occurrences:   20     Standing Expiration Date:   04/08/2020    All questions were answered. The patient knows to call the clinic with any problems, questions or concerns. The total time spent in the appointment was 40 minutes encounter with patients including review of chart and various tests results, discussions about plan of care and coordination of care plan   Heath Lark, MD 04/09/2019 11:06 AM  INTERVAL HISTORY: Please see below for problem oriented charting. She returns with her husband and son for further follow-up She tolerated recent biopsy well She have minimal residual neuropathy from prior treatment She desire treatment that does not cause hair loss if possible  SUMMARY OF ONCOLOGIC HISTORY: Oncology History Overview Note  Neg for genetics blood work and HRD High grade serous, recurrent biopsy proven   Right ovarian epithelial cancer ([place])  01/06/2018 Imaging   Ct abdomen and pelvis 1. Complex cystic mass at the right adnexa, measuring 5.9 x 4.0 cm, with nodular components, concerning for primary ovarian malignancy. 2. Diffuse nodularity along the omentum at the left side of the abdomen, extending into the mesentery at the left mid abdomen, concerning for peritoneal carcinomatosis. 3. Wall thickening at the distal ileum adjacent to the ovarian mass; bowel loops appear somewhat adherent to the ovarian mass. Bowel infiltration with tumor cannot be excluded. No evidence of bowel obstruction at this time. 4. Small volume ascites within the abdomen and pelvis.  Aortic Atherosclerosis (ICD10-I70.0).   01/08/2018 Tumor Marker   Patient's tumor was tested for the following markers: CA-125. Results of the tumor marker test revealed 3004   01/15/2018 Imaging   Chest CT:  1. No active cardiopulmonary disease. 2. Aortic atherosclerosis without aneurysm or dissection. 3. No large central pulmonary embolus.  CT AP:  1. Dilated fluid-filled loops of small bowel are redemonstrated slightly more  extensive than on prior exam with transition point likely in the right adnexa adjacent to a complex cystic mass concerning for ovarian neoplasm given septations and soft tissue nodularity. This raises concern for early or partial SBO. This soft tissue mass measures 4.9 x 4 x 4.7 cm and has not changed since prior recent comparison. Additional short segmental area of luminal narrowing is noted in the right lower quadrant involving small bowel for which stigmata of peritoneal carcinomatosis or small-bowel metastatic implants might account for this. 2. Redemonstration of small volume of ascites predominantly in the upper abdomen surrounding the liver and spleen. 3. Redemonstration of thick bandlike omental thickening concerning for peritoneal carcinomatosis.    01/15/2018 Procedure   Successful ultrasound-guided diagnostic and therapeutic paracentesis yielding 1.5 liters of peritoneal fluid.    01/15/2018 Pathology Results   PERITONEAL/ASCITIC FLUID (SPECIMEN 1 OF 1 COLLECTED 01/18/18): MALIGNANT CELLS CONSISTENT WITH METASTATIC ADENOCARCINOMA. SEE COMMENT. COMMENT: THE MALIGNANT CELLS ARE POSITIVE FOR MOC-31, CYTOKERATIN 7, ESTROGEN RECEPTOR, PAX-8, AND WT-1. THEY ARE NEGATIVE FOR CALRETININ, CYTOKERATIN 5/6, AND CYTOKERATIN 20. THE PROFILE IS CONSISTENT WITH A PRIMARY GYNECOLOGIC CARCINOMA. THERE IS LIKELY SUFFICIENT TUMOR PRESENT, IF ADDITIONAL STUDIES ARE REQUESTED.   01/15/2018 - 02/02/2018 Hospital Admission   She was admitted to the hospital for SBO. She was treated with chemotherapy   01/18/2018 - 06/20/2018 Chemotherapy   The patient had carboplatin and taxol   02/08/2018 Cancer Staging   Staging form: Ovary, Fallopian Tube, and Primary Peritoneal Carcinoma, AJCC 8th Edition -  Clinical: cT3, cN0, cM0 - Signed by Heath Lark, MD on 02/08/2018   02/08/2018 Tumor Marker   Patient's tumor was tested for the following markers: CA-125. Results of the tumor marker test revealed 445   03/02/2018 Tumor  Marker   Patient's tumor was tested for the following markers: CA-125. Results of the tumor marker test revealed 174   03/15/2018 Imaging   6.2 cm complex cystic right ovarian mass, compatible with malignant ovarian neoplasm, mildly progressive.  Associated peritoneal disease/omental caking beneath the anterior abdominal wall, mildly improved. Prior abdominal ascites is improved/resolved.  4.7 cm cystic left ovarian mass, without overt malignant features, grossly unchanged.  Mild bilateral hydroureteronephrosis, secondary to extrinsic compression, new.  Prior small bowel obstruction has improved/resolved.    03/27/2018 Pathology Results   1. Omentum, resection for tumor - OMENTUM: - HIGH GRADE SEROUS CARCINOMA. 2. Ovary, left - LEFT OVARY: - HIGH GRADE SEROUS CARCINOMA. - LEFT FALLOPIAN TUBE: - HIGH GRADE SEROUS CARCINOMA. 3. Adnexa - ovary +/- tube, neoplastic, right - RIGHT OVARY: - HIGH GRADE SEROUS CARCINOMA, 5.5 CM. - RIGHT FALLOPIAN TUBE: - HIGH GRADE SEROUS CARCINOMA. 4. Peritoneum, biopsy, nodule - PERITONEUM: - NODULES OF HIGH GRADE SEROUS CARCINOMA. 5. Peritoneum, resection for tumor, rectosigmoid - RECTOSIGMOID PERITONEUM: - HIGH GRADE SEROUS CARCINOMA. Microscopic Comment 3. OVARY or FALLOPIAN TUBE or PRIMARY PERITONEUM: Procedure: Bilateral salpingo-oophorectomy, omentectomy and peritoneal biopsies. Specimen Integrity: N/A. Tumor Site: Right ovary. Ovarian Surface Involvement (required only if applicable): Yes. Fallopian Tube Surface Involvement (required only if applicable): Yes. Tumor Size: 5.5 x 4.8 x 4.0 cm. Histologic Type: Serous carcinoma. Histologic Grade: High grade. Implants (required for advanced stage serous/seromucinous borderline tumors only): Omentum, left ovary and fallopian tube, rectosigmoid peritoneum and peritoneum. Other Tissue/ Organ Involvement: Left ovary and fallopian tube, omentum and rectosigmoid peritoneum. Largest Extrapelvic  Peritoneal Focus (required only if applicable): 26.7 cm, omentum. Peritoneal/Ascitic Fluid: N/A. Treatment Effect (required only for high-grade serous carcinomas): Minimal. Regional Lymph Nodes: No lymph nodes submitted. Pathologic Stage Classification (pTNM, AJCC 8th Edition): pT3c, pNX. Representative Tumor Block: 3A, 3B, 3D, 3E and 72F. Comment(s): There is a 5.5 cm in greatest dimension partially cystic high grade serous carcinoma involving the right adnexal specimen and the tumor is staged as a primary right ovarian carcinoma. The carcinoma also involves the left ovary as well as the left fallopian tube, the omentum, peritoneum and the rectosigmoid peritoneal specimens.   03/27/2018 Surgery   Preoperative Diagnosis: ovarian cancer, stage IIIC, metastatic to omentum, peritoneum, serosa of intestines   Procedure(s) Performed: 1. Exploratory laparotomy with bilateral salpingo-oophorectomy, omentectomy radical tumor debulking for ovarian cancer .  Surgeon: Thereasa Solo, MD.   Specimens: Bilateral tubes / ovaries, omentum. Peritoneal nodules, rectosigmoid nodules.    Operative Findings: omental cake, miliary studding on diaphragm and bilateral paracolic gutters. Sigmoid colon densely adherent to left and right ovaries with tumor rind, ureters mildly dilated bilaterally due to retroperitoneal extension of the tumor towards ureters causing compression.    This represented an optimal cytoreduction (R1) with gross residual disease on the diaphragm that had been ablated and a thin tumor plaque on the sigmoid colon that was ablated. No residual disease >1cm remaining   05/07/2018 Genetic Testing   Negative genetic testing on the [employer] TumorNextHRD+ CancerNext Panel. The CancerNext gene panel offered by [employer] includes sequencing and rearrangement analysis for the following 34 genes:   APC, ATM, BARD1, BMPR1A, BRCA1, BRCA2, BRIP1, CDH1, CDK4, CDKN2A, CHEK2, DICER1, EPCAM, GREM1, HOXB13, MLH1,  MRE11A,  MSH2, MSH6, MUTYH, NBN, NF1, PALB2, PMS2, POLD1, POLE, PTEN, RAD50, RAD51C, RAD51D, SMAD4, SMARCA4, STK11, and TP53. Somatic genes analyzed through TumorNext-HRD: ATM, BARD1, BRCA1, BRCA2, BRIP1, CHEK2, MRE11A, NBN, PALB2, RAD51C, RAD51D. The report date is 05/07/2018.   05/11/2018 Tumor Marker   Patient's tumor was tested for the following markers: CA-125. Results of the tumor marker test revealed 60.5   07/02/2018 Imaging   1. Mild right pericardiophrenic adenopathy is mildly increased, suspicious for metastatic nodes. No abdominopelvic adenopathy. 2. Small left peritoneal soft tissue nodule adjacent to the splenic flexure and smooth left pelvic sidewall peritoneal thickening, cannot exclude residual/recurrent disease. Attention on follow-up CT advised. 3. Bilateral renal collecting system dilatation has improved. 4.  Aortic Atherosclerosis (ICD10-I70.0).   07/02/2018 Tumor Marker   Patient's tumor was tested for the following markers: CA-125 Results of the tumor marker test revealed 42.7   07/11/2018 Echocardiogram   1. The left ventricle has normal systolic function with an ejection fraction of 60-65%. The cavity size was normal. Left ventricular diastolic parameters were normal.  2. Normal GLS -20.1.  3. The right ventricle has normal systolic function. The cavity was normal. There is no increase in right ventricular wall thickness.  4. The mitral valve is degenerative. Moderate thickening of the mitral valve leaflet. Moderate calcification of the mitral valve leaflet.  5. The aortic valve is tricuspid. Moderate thickening of the aortic valve. Moderate calcification of the aortic valve. Aortic valve regurgitation is trivial by color flow Doppler.  6. The aortic root is normal in size and structure.    Chemotherapy   The patient had doxorubicin and bevacizumab for chemotherapy treatment.     07/30/2018 Tumor Marker   Patient's tumor was tested for the following markers:  CA-125 Results of the tumor marker test revealed 82.6   08/27/2018 Tumor Marker   Patient's tumor was tested for the following markers: CA-125 Results of the tumor marker test revealed 111.   09/24/2018 Tumor Marker   Patient's tumor was tested for the following markers: CA-125 Results of the tumor marker test revealed 142.   10/05/2018 Imaging   1. Stable right juxta diaphragmatic/pericardiophrenic lymph node, mildly enlarged. 2. Otherwise no definite metastatic disease identified in the abdomen/pelvis. 3. 3 mm peritoneal nodule noted left paracolic gutter on today's study, indeterminate. Attention on follow-up recommended. 4.  Aortic Atherosclerois (ICD10-170.0)   10/16/2018 Echocardiogram    1. No significant change from prior study (07/11/2018).  2. Left ventricular ejection fraction, by visual estimation, is 60 to 65%. The left ventricle has normal function. Normal left ventricular size. There is no left ventricular hypertrophy.  3. LVEF by 3D assessment 63%.  4. The average left ventricular global longitudinal strain is -22.4 %.  5. Global right ventricle has normal systolic function.The right ventricular size is normal. No increase in right ventricular wall thickness.  6. Left atrial size was normal.  7. Right atrial size was normal.  8. Presence of pericardial fat pad.  9. Moderate thickening of the mitral valve leaflet(s). 10. The mitral valve is normal in structure. Trace mitral valve regurgitation. 11. The tricuspid valve is normal in structure. Tricuspid valve regurgitation is mild. 12. Aortic regurgitation PHT measures 467 msec. 13. The aortic valve is tricuspid Aortic valve regurgitation is mild by color flow Doppler. Mild aortic valve sclerosis without stenosis. 14. There is mild to moderate calcification of the AoV, with focal calcification of the [place]. The aortic regurgitation is mild in severity, likely related to degenerative valve disease.  15. The pulmonic valve was  grossly normal. Pulmonic valve regurgitation is not visualized by color flow Doppler. 16. Mild plaque invoving the ascending aorta. 17. Normal pulmonary artery systolic pressure. 18. The tricuspid regurgitant velocity is 2.52 m/s, and with an assumed right atrial pressure of 3 mmHg, the estimated right ventricular systolic pressure is normal at 28.4 mmHg.   10/26/2018 Tumor Marker   Patient's tumor was tested for the following markers: CA-125 Results of the tumor marker test revealed 196   12/11/2018 Imaging   1. No substantial interval change in exam. No definite findings to suggest recurrent/metastatic disease. 2. Right pericardial phrenic lymph node noted on multiple prior studies is unchanged in the interval. 3. 8 mm omental nodule identified on today's exam. Close attention on follow-up recommended. 3 mm soft tissue nodule along the left paracolic gutter seen previously has resolved in the interval. 4.  Aortic Atherosclerois (ICD10-170.0)   04/02/2019 Imaging   1. Interval development of left axillary and subpectoral lymphadenopathy is highly concerning for metastatic disease, however, this would be a highly unusual pattern of metastatic spread for an ovarian primary neoplasm. This is most concerning for potential left-sided breast cancer. Correlation with mammography is strongly recommended. 2. No other signs of definite metastatic disease noted elsewhere in the chest, abdomen or pelvis. 3. Aortic atherosclerosis. 4. Additional incidental findings, similar to prior studies, as above.     04/03/2019 Pathology Results   Immunohistochemistry shows the carcinoma is positive with cytokeratin AE1/AE3, cytokeratin 7, PAX-8, WT-1, and shows patchy positivity with estrogen receptor. The carcinoma is negative with GATA3, GCDFP, p53, Napsin A and TTF-1. The immunophenotype is most consistent with metastatic high grade serous carcinoma. (JDP:ah 04/04/19)FINAL DIAGNOSIS Diagnosis Lymph node,  needle/core biopsy, left axilla - METASTATIC CARCINOMA. - SEE MICROSCOPIC DESCRIPT     REVIEW OF SYSTEMS:   Constitutional: Denies fevers, chills or abnormal weight loss Eyes: Denies blurriness of vision Ears, nose, mouth, throat, and face: Denies mucositis or sore throat Respiratory: Denies cough, dyspnea or wheezes Cardiovascular: Denies palpitation, chest discomfort or lower extremity swelling Gastrointestinal:  Denies nausea, heartburn or change in bowel habits Skin: Denies abnormal skin rashes Lymphatics: Denies new lymphadenopathy or easy bruising Neurological:Denies numbness, tingling or new weaknesses Behavioral/Psych: Mood is stable, no new changes  All other systems were reviewed with the patient and are negative.  I have reviewed the past medical history, past surgical history, social history and family history with the patient and they are unchanged from previous note.  ALLERGIES:  is allergic to tramadol hcl.  MEDICATIONS:  Current Outpatient Medications  Medication Sig Dispense Refill  . acetaminophen (TYLENOL) 500 MG tablet Take 2 tablets (1,000 mg total) by mouth every 6 (six) hours. (Patient taking differently: Take 1,000 mg by mouth every 6 (six) hours as needed. ) 30 tablet 0  . ALPRAZolam (XANAX) 0.5 MG tablet TAKE 1/2 TO 1 (ONE-HALF TO ONE) TABLET BY MOUTH ONCE DAILY AS NEEDED FOR ANXIETY 30 tablet 2  . alum & mag hydroxide-simeth (MAALOX/MYLANTA) 200-200-20 MG/5ML suspension Take 30 mLs by mouth every 6 (six) hours as needed for indigestion or heartburn. 355 mL 0  . Artificial Saliva (SALIVASURE) LOZG Use as directed 1 lozenge in the mouth or throat every 4 (four) hours as needed. 90 lozenge 0  . Calcium Carbonate (CALTRATE 600) 1500 MG TABS Take 600 mg of elemental calcium by mouth daily.     . cholecalciferol (VITAMIN D) 1000 UNITS tablet Take 1,000 Units by mouth daily.     Marland Kitchen  estradiol (ESTRACE) 0.1 MG/GM vaginal cream Place 1 Applicatorful vaginally 3 (three)  times a week. 42.5 g 12  . fluticasone (FLONASE) 50 MCG/ACT nasal spray Place 2 sprays into both nostrils daily. 48 g 2  . gabapentin (NEURONTIN) 300 MG capsule Take 1 capsule (300 mg total) by mouth 2 (two) times daily. 60 capsule 11  . HYDROmorphone (DILAUDID) 2 MG tablet Take 1 tablet (2 mg total) by mouth every 4 (four) hours as needed for severe pain. 30 tablet 0  . ibuprofen (ADVIL,MOTRIN) 200 MG tablet Take 600 mg by mouth every 6 (six) hours as needed for moderate pain.     Marland Kitchen lactose free nutrition (BOOST PLUS) LIQD Take 237 mLs by mouth daily. (Patient taking differently: Take 237 mLs by mouth 2 (two) times daily between meals. ) 10 Can 0  . levothyroxine (SYNTHROID) 75 MCG tablet Take 1 tablet (75 mcg total) by mouth daily. 90 tablet 3  . lidocaine-prilocaine (EMLA) cream Apply to affected area once 30 g 3  . Magnesium 400 MG TABS Take 250 mg by mouth daily.    . Menthol-Methyl Salicylate (MUSCLE RUB) 10-15 % CREA Apply 1 application topically as needed for muscle pain.    . metoprolol succinate (TOPROL-XL) 50 MG 24 hr tablet Take 1 tablet (50 mg total) by mouth daily. Take with or immediately following a meal. 90 tablet 2  . Multiple Vitamins-Minerals (MULTIVITAMIN WITH MINERALS) tablet Take 1 tablet by mouth daily.      Marland Kitchen nystatin (MYCOSTATIN) 100000 UNIT/ML suspension Take 5 mLs (500,000 Units total) by mouth 4 (four) times daily. 473 mL 0  . Omega-3 Fatty Acids (FISH OIL) 1000 MG CAPS Take 2,000 mg by mouth 2 (two) times daily.     Marland Kitchen omeprazole (PRILOSEC) 40 MG capsule Take 1 capsule (40 mg total) by mouth daily. 90 capsule 3  . ondansetron (ZOFRAN) 8 MG tablet Take 1 tablet (8 mg total) by mouth every 8 (eight) hours as needed (Nausea or vomiting). 30 tablet 1  . polyethylene glycol (MIRALAX / GLYCOLAX) packet Take 17 g by mouth daily. 14 each 0  . prochlorperazine (COMPAZINE) 10 MG tablet Take 1 tablet (10 mg total) by mouth every 6 (six) hours as needed (Nausea or vomiting). 30  tablet 1  . senna (SENOKOT) 8.6 MG TABS tablet Take 1 tablet (8.6 mg total) by mouth at bedtime as needed for mild constipation. 120 each 0  . simvastatin (ZOCOR) 20 MG tablet Take 1 tablet (20 mg total) by mouth at bedtime. 90 tablet 1  . vitamin C (ASCORBIC ACID) 500 MG tablet Take 1,000 mg by mouth daily.    [name] 20 MG TABS tablet TAKE 1 TABLET BY MOUTH ONCE DAILY WITH  SUPPER 90 tablet 1   No current facility-administered medications for this visit.    PHYSICAL EXAMINATION: ECOG PERFORMANCE STATUS: 1 - Symptomatic but completely ambulatory  Vitals:   04/09/19 1028  BP: (!) 153/72  Pulse: 61  Resp: 18  Temp: 98.9 F (37.2 C)  SpO2: 100%   Filed Weights   04/09/19 1028  Weight: 147 lb 6.4 oz (66.9 kg)    GENERAL:alert, no distress and comfortable NEURO: alert & oriented x 3 with fluent speech, no focal motor/sensory deficits  LABORATORY DATA:  I have reviewed the data as listed    Component Value Date/Time   NA 141 03/26/2019 0935   K 3.6 03/26/2019 0935   CL 106 03/26/2019 0935   CO2 26 03/26/2019 0935  GLUCOSE 100 (H) 03/26/2019 0935   BUN 11 03/26/2019 0935   CREATININE 0.76 03/26/2019 0935   CREATININE 0.76 11/17/2015 1005   CALCIUM 8.7 (L) 03/26/2019 0935   PROT 6.5 03/26/2019 0935   ALBUMIN 3.3 (L) 03/26/2019 0935   AST 14 (L) 03/26/2019 0935   ALT 11 03/26/2019 0935   ALKPHOS 78 03/26/2019 0935   BILITOT 0.3 03/26/2019 0935   GFRNONAA >60 03/26/2019 0935   GFRAA >60 03/26/2019 0935    No results found for: SPEP, UPEP  Lab Results  Component Value Date   WBC 7.0 03/26/2019   NEUTROABS 3.8 03/26/2019   HGB 12.0 03/26/2019   HCT 37.4 03/26/2019   MCV 96.4 03/26/2019   PLT 170 03/26/2019      Chemistry      Component Value Date/Time   NA 141 03/26/2019 0935   K 3.6 03/26/2019 0935   CL 106 03/26/2019 0935   CO2 26 03/26/2019 0935   BUN 11 03/26/2019 0935   CREATININE 0.76 03/26/2019 0935   CREATININE 0.76 11/17/2015 1005       Component Value Date/Time   CALCIUM 8.7 (L) 03/26/2019 0935   ALKPHOS 78 03/26/2019 0935   AST 14 (L) 03/26/2019 0935   ALT 11 03/26/2019 0935   BILITOT 0.3 03/26/2019 0935       RADIOGRAPHIC STUDIES: I have personally reviewed the radiological images as listed and agreed with the findings in the report. CT CHEST W CONTRAST  Result Date: 04/02/2019 CLINICAL DATA:  74 year old female with history of ovarian cancer. Staging examination. Palpable left axillary lymph node enlargement. EXAM: CT CHEST, ABDOMEN, AND PELVIS WITH CONTRAST TECHNIQUE: Multidetector CT imaging of the chest, abdomen and pelvis was performed following the standard protocol during bolus administration of intravenous contrast. CONTRAST:  70m OMNIPAQUE IOHEXOL 300 MG/ML  SOLN COMPARISON:  CT the abdomen and pelvis 12/11/2018. CT the chest, abdomen and pelvis 01/15/2018. FINDINGS: CT CHEST FINDINGS Cardiovascular: Heart size is normal. There is no significant pericardial fluid, thickening or pericardial calcification. Aortic atherosclerosis. No definite coronary artery calcifications. Right internal jugular single-lumen porta cath with tip terminating in the distal superior vena cava. Mediastinum/Nodes: Borderline enlarged anterior mediastinal lymph node adjacent to the right atrioventricular groove (axial image 48 of series 3) currently measuring 1.1 cm in short axis, similar to recent prior examinations. No other pathologically enlarged mediastinal or hilar lymph nodes are noted. Esophagus is unremarkable in appearance. Numerous left axillary lymph nodes and left subpectoral lymph nodes appear increased in size compared to the prior examination. The largest left axillary lymph nodes measure up to 1.5 cm in short axis, while the largest subpectoral lymph node measures up to 1.1 cm in short axis. Lungs/Pleura: No suspicious pulmonary nodules or masses are noted. No acute consolidative airspace disease. No pleural effusions.  Musculoskeletal: There are no aggressive appearing lytic or blastic lesions noted in the visualized portions of the skeleton. CT ABDOMEN PELVIS FINDINGS Hepatobiliary: 1 cm low-attenuation lesion in segment 2 of the liver, similar to the prior study, compatible with a simple cyst. No other suspicious hepatic lesions. No intra or extrahepatic biliary ductal dilatation. Gallbladder is normal in appearance. Pancreas: No pancreatic mass. No pancreatic ductal dilatation. No pancreatic or peripancreatic fluid collections or inflammatory changes. Spleen: Unremarkable. Adrenals/Urinary Tract: Bilateral kidneys and bilateral adrenal glands are normal in appearance. No hydroureteronephrosis. Urinary bladder is normal in appearance. Stomach/Bowel: Normal appearance of the stomach. No pathologic dilatation of small bowel or colon. The appendix is not confidently identified  and may be surgically absent. Regardless, there are no inflammatory changes noted adjacent to the cecum to suggest the presence of an acute appendicitis at this time. Vascular/Lymphatic: Aortic atherosclerosis, without evidence of aneurysm or dissection in the abdominal or pelvic vasculature. No lymphadenopathy noted in the abdomen or pelvis. Reproductive: Status post total abdominal hysterectomy and bilateral salpingo-oophorectomy. No unexpected soft tissue mass in the low anatomic pelvis to suggest locally recurrent disease. Other: Small ventral hernia containing only omental fat. No significant volume of ascites. No pneumoperitoneum. Musculoskeletal: There are no aggressive appearing lytic or blastic lesions noted in the visualized portions of the skeleton. IMPRESSION: 1. Interval development of left axillary and subpectoral lymphadenopathy is highly concerning for metastatic disease, however, this would be a highly unusual pattern of metastatic spread for an ovarian primary neoplasm. This is most concerning for potential left-sided breast cancer.  Correlation with mammography is strongly recommended. 2. No other signs of definite metastatic disease noted elsewhere in the chest, abdomen or pelvis. 3. Aortic atherosclerosis. 4. Additional incidental findings, similar to prior studies, as above. Electronically Signed   By: Vinnie Langton M.D.   On: 04/02/2019 12:19   CT ABDOMEN PELVIS W CONTRAST  Result Date: 04/02/2019 CLINICAL DATA:  74 year old female with history of ovarian cancer. Staging examination. Palpable left axillary lymph node enlargement. EXAM: CT CHEST, ABDOMEN, AND PELVIS WITH CONTRAST TECHNIQUE: Multidetector CT imaging of the chest, abdomen and pelvis was performed following the standard protocol during bolus administration of intravenous contrast. CONTRAST:  77m OMNIPAQUE IOHEXOL 300 MG/ML  SOLN COMPARISON:  CT the abdomen and pelvis 12/11/2018. CT the chest, abdomen and pelvis 01/15/2018. FINDINGS: CT CHEST FINDINGS Cardiovascular: Heart size is normal. There is no significant pericardial fluid, thickening or pericardial calcification. Aortic atherosclerosis. No definite coronary artery calcifications. Right internal jugular single-lumen porta cath with tip terminating in the distal superior vena cava. Mediastinum/Nodes: Borderline enlarged anterior mediastinal lymph node adjacent to the right atrioventricular groove (axial image 48 of series 3) currently measuring 1.1 cm in short axis, similar to recent prior examinations. No other pathologically enlarged mediastinal or hilar lymph nodes are noted. Esophagus is unremarkable in appearance. Numerous left axillary lymph nodes and left subpectoral lymph nodes appear increased in size compared to the prior examination. The largest left axillary lymph nodes measure up to 1.5 cm in short axis, while the largest subpectoral lymph node measures up to 1.1 cm in short axis. Lungs/Pleura: No suspicious pulmonary nodules or masses are noted. No acute consolidative airspace disease. No pleural  effusions. Musculoskeletal: There are no aggressive appearing lytic or blastic lesions noted in the visualized portions of the skeleton. CT ABDOMEN PELVIS FINDINGS Hepatobiliary: 1 cm low-attenuation lesion in segment 2 of the liver, similar to the prior study, compatible with a simple cyst. No other suspicious hepatic lesions. No intra or extrahepatic biliary ductal dilatation. Gallbladder is normal in appearance. Pancreas: No pancreatic mass. No pancreatic ductal dilatation. No pancreatic or peripancreatic fluid collections or inflammatory changes. Spleen: Unremarkable. Adrenals/Urinary Tract: Bilateral kidneys and bilateral adrenal glands are normal in appearance. No hydroureteronephrosis. Urinary bladder is normal in appearance. Stomach/Bowel: Normal appearance of the stomach. No pathologic dilatation of small bowel or colon. The appendix is not confidently identified and may be surgically absent. Regardless, there are no inflammatory changes noted adjacent to the cecum to suggest the presence of an acute appendicitis at this time. Vascular/Lymphatic: Aortic atherosclerosis, without evidence of aneurysm or dissection in the abdominal or pelvic vasculature. No lymphadenopathy noted in the  abdomen or pelvis. Reproductive: Status post total abdominal hysterectomy and bilateral salpingo-oophorectomy. No unexpected soft tissue mass in the low anatomic pelvis to suggest locally recurrent disease. Other: Small ventral hernia containing only omental fat. No significant volume of ascites. No pneumoperitoneum. Musculoskeletal: There are no aggressive appearing lytic or blastic lesions noted in the visualized portions of the skeleton. IMPRESSION: 1. Interval development of left axillary and subpectoral lymphadenopathy is highly concerning for metastatic disease, however, this would be a highly unusual pattern of metastatic spread for an ovarian primary neoplasm. This is most concerning for potential left-sided breast  cancer. Correlation with mammography is strongly recommended. 2. No other signs of definite metastatic disease noted elsewhere in the chest, abdomen or pelvis. 3. Aortic atherosclerosis. 4. Additional incidental findings, similar to prior studies, as above. Electronically Signed   By: Vinnie Langton M.D.   On: 04/02/2019 12:19   MM DIAG BREAST TOMO UNI LEFT  Result Date: 04/03/2019 CLINICAL DATA:  74 year old female with history of ovarian cancer. Recent CT scan of the chest abdomen and pelvis showed enlarged left axillary adenopathy. EXAM: DIGITAL DIAGNOSTIC LEFT MAMMOGRAM WITH CAD AND TOMO ULTRASOUND LEFT BREAST COMPARISON:  Previous exam(s). ACR Breast Density Category c: The breast tissue is heterogeneously dense, which may obscure small masses. FINDINGS: On the MLO view enlarged left axillary adenopathy is identified. No suspicious mass or malignant type microcalcifications identified in the left breast. Mammographic images were processed with CAD. Targeted ultrasound is performed, showing 4 abnormal lymph nodes with cortical thickening in the left axilla. They measure 3.2 x 1.2 x 1.7 cm, 1.4 x 1.4 x 1.6 cm, 1.6 x 1.5 x 1.8 cm and 7 x 7 x 7 mm. IMPRESSION: Suspicious left axillary adenopathy. RECOMMENDATION: Ultrasound-guided core biopsy of the abnormal left axillary adenopathy is recommended. If the adenopathy is malignancy of a breast origin further evaluation of the breast with MRI would be recommended. I have discussed the findings and recommendations with the patient. If applicable, a reminder letter will be sent to the patient regarding the next appointment. BI-RADS CATEGORY  4: Suspicious. Electronically Signed   By: Lillia Mountain M.D.   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 The differential includes metastatic high grade serous  carcinoma. This was found to be concordant by Dr. Marin Olp and likely represents metastatic disease from patient's known ovarian carcinoma. Pathology results were discussed with the patient and Elmo Putt RN Oncology Nurse Navigator by telephone. The patient reported doing well after the biopsy with tenderness at the site. Post biopsy instructions and care were reviewed and questions were answered. The patient was encouraged to call [facility] for any additional concerns. The patient is scheduled for follow up care with Dr. Heath Lark to discuss results and treatment plan on April 09, 2019. Pathology results reported by Stacie Acres RN on 04/04/2019. Electronically Signed   By: Marin Olp M.D.   On: 04/04/2019 14:32   Result Date: 04/04/2019 CLINICAL DATA:  Abnormal left axillary adenopathy. Patient has a history of metastatic ovarian carcinoma with completion of chemotherapy November 2020. Patient is on Xarelto. EXAM: [place] AXILLARY NODE CORE BIOPSY LEFT COMPARISON:  Previous exam(s). PROCEDURE: I met with the patient and we discussed the procedure of ultrasound-guided biopsy, including benefits and alternatives. We discussed the high likelihood of a successful procedure. We discussed  the risks of the procedure, including infection, bleeding, tissue injury, clip migration, and inadequate sampling. Informed written consent was given. The usual time-out protocol was performed immediately prior to the procedure. Using sterile technique and 1% Lidocaine as local anesthetic, under direct ultrasound visualization, a 14 gauge spring-loaded device was used to perform biopsy of the targeted abnormal left axillary lymph node using a inferior to superior approach. Four tissue specimens were obtained as 2 were placed in saline and 2 in formalin. Patient tolerated the procedure well without complications. At the conclusion of the procedure [facility] tissue marker clip was deployed into the  biopsy cavity. Follow up mammographic imaging was performed and dictated separately. IMPRESSION: Ultrasound guided biopsy of abnormal left axillary lymph node. No apparent complications. Electronically Signed: By: Marin Olp M.D. On: 04/03/2019 11:39   [place] AXILLA LEFT  Result Date: 04/03/2019 CLINICAL DATA:  74 year old female with history of ovarian cancer. Recent CT scan of the chest abdomen and pelvis showed enlarged left axillary adenopathy. EXAM: DIGITAL DIAGNOSTIC LEFT MAMMOGRAM WITH CAD AND TOMO ULTRASOUND LEFT BREAST COMPARISON:  Previous exam(s). ACR Breast Density Category c: The breast tissue is heterogeneously dense, which may obscure small masses. FINDINGS: On the MLO view enlarged left axillary adenopathy is identified. No suspicious mass or malignant type microcalcifications identified in the left breast. Mammographic images were processed with CAD. Targeted ultrasound is performed, showing 4 abnormal lymph nodes with cortical thickening in the left axilla. They measure 3.2 x 1.2 x 1.7 cm, 1.4 x 1.4 x 1.6 cm, 1.6 x 1.5 x 1.8 cm and 7 x 7 x 7 mm. IMPRESSION: Suspicious left axillary adenopathy. RECOMMENDATION: Ultrasound-guided core biopsy of the abnormal left axillary adenopathy is recommended. If the adenopathy is malignancy of a breast origin further evaluation of the breast with MRI would be recommended. I have discussed the findings and recommendations with the patient. If applicable, a reminder letter will be sent to the patient regarding the next appointment. BI-RADS CATEGORY  4: Suspicious. Electronically Signed   By: Lillia Mountain M.D.   On: 04/03/2019 10:34

## 2019-04-09 NOTE — Assessment & Plan Note (Signed)
She has mild peripheral neuropathy but not severe We will avoid taxanes for now

## 2019-04-09 NOTE — Assessment & Plan Note (Signed)
I reviewed the pathology report with the patient and family I will get pathologist to add PD-L1 testing We discussed the role of chemotherapy. The intent is of palliative intent.  We reviewed the NCCN guidelines.  The treatment decision is based on the following publication:  Randomized Phase III Trial of Gemcitabine Compared With Pegylated Liposomal Doxorubicin in Patients With Platinum-Resistant Ovarian Cancer Lorenso Quarry. Arlyss Queen [place], Santiago Glad Teneriello, Willette Alma, [facility], Meredeth Ide, Veleta Miners, [facility], [place]. Evonnie Pat, and [employer] [place] A B S T R A C T Purpose Ovarian cancer (OC) patients experiencing progressive disease (PD) within 6 months of platinum based therapy in the primary setting are considered platinum resistant (Pt-R). Currently, pegylated liposomal doxorubicin (PLD) is a standard of care for treatment of recurrent Pt-R disease. On the basis of promising phase II results, gemcitabine was compared with PLD for efficacy and safety in taxane-pretreated Pt-R OC patients. Patients and Methods Patients (n  195) with Pt-R OC were randomly assigned to either gemcitabine 1,000 mg/m2 (days 1 and 8; every 21 days) or PLD 50 mg/m2 (day 1; every 28 days) until PD or undue toxicity. Optional cross-over therapy was allowed at PD or at withdrawal because of toxicity. Primary end point was progression-free survival (PFS). Additional end points included tumor response, time to treatment failure, survival, and quality of life. Results In the gemcitabine and PLD groups, median PFS was 3.6 v 3.1 months; median overall survival was 12.7 v 13.5 months; overall response rate (ORR) was 6.1% v 8.3%; and in the subset of patients with measurable disease, ORR was 9.2% v 11.7%, respectively. None of the efficacy end points showed a statistically significant difference between treatment groups. The PLD group experienced significantly more  hand-foot syndrome and mucositis; the gemcitabine group experienced significantly more constipation, nausea/vomiting, fatigue, and neutropenia but not febrile neutropenia. Conclusion Although this was not designed as an equivalency study, gemcitabine and PLD seem to have a comparable therapeutic index in this population of Pt-R taxane-pretreated OC patients. Single agent gemcitabine may be an acceptable alternative to PLD for patients with platinum resistant ovarian cancer J Clin Oncol 25:2811-2818.  2007 by [employer] of Clinical Oncology  I recommend treatment modifications to 800 mg per metered squared days 1, 8, and 15, every 28 days for better tolerability I will monitor closely the size of the lymph node on physical examination to track the progress We will repeat imaging study after 2-3 cycles

## 2019-04-09 NOTE — Telephone Encounter (Signed)
Scheduled per 3/30 sch msg. Called and spoke with pt, confirmed 4/5 appt

## 2019-04-09 NOTE — Progress Notes (Signed)
DISCONTINUE ON PATHWAY REGIMEN - Ovarian     A cycle is every 28 days:     Liposomal doxorubicin      Bevacizumab-xxxx   **Always confirm dose/schedule in your pharmacy ordering system**  REASON: Disease Progression PRIOR TREATMENT: [id number]: Liposomal Doxorubicin (Doxil) 40 mg/m2 D1 + Bevacizumab 10 mg/kg D1, 15 q28 Days; Stop Treatment After 6 Cycles If Complete Response, Otherwise Continue Treatment Until Progression or Unacceptable Toxicity TREATMENT RESPONSE: Progressive Disease (PD)  START OFF PATHWAY REGIMEN - Ovarian   [id number]:Gemcitabine 1,000 mg/m2 IV D1,8,15 q28 Days:   A cycle is every 28 days:     Gemcitabine   **Always confirm dose/schedule in your pharmacy ordering system**  Patient Characteristics: Recurrent or Progressive Disease, Third Line, Platinum Resistant or < 6 Months Since Last Platinum Therapy Therapeutic Status: Recurrent or Progressive Disease BRCA Mutation Status: Absent Line of Therapy: Third Line  Intent of Therapy: Non-Curative / Palliative Intent, Discussed with Patient

## 2019-04-09 NOTE — Assessment & Plan Note (Signed)
We have extensive discussions about goals of care She understood that treatment is palliative in nature She understood why surgery and radiation are not indicated She has advanced directive and living will We discussed CODE STATUS She wants to be resuscitated but does not want to be maintained on life support in the event of terminal illness

## 2019-04-10 NOTE — Progress Notes (Signed)
Pharmacist Chemotherapy Monitoring - Initial Assessment    Anticipated start date: 04/15/2019   Regimen:  . Are orders appropriate based on the patient's diagnosis, regimen, and cycle? Yes . Does the plan date match the patient's scheduled date? Yes . Is the sequencing of drugs appropriate? Yes . Are the premedications appropriate for the patient's regimen? Yes . Prior Authorization for treatment is: Approved o If applicable, is the correct biosimilar selected based on the patient's insurance? not applicable  Organ Function and Labs: Marland Kitchen Are dose adjustments needed based on the patient's renal function, hepatic function, or hematologic function? No further dose reduction required . Are appropriate labs ordered prior to the start of patient's treatment? Yes . Other organ system assessment, if indicated: N/A . The following baseline labs, if indicated, have been ordered: N/A  Dose Assessment: . Are the drug doses appropriate? Yes . Are the following correct: o Drug concentrations Yes o IV fluid compatible with drug Yes o Administration routes Yes o Timing of therapy Yes . If applicable, does the patient have documented access for treatment and/or plans for port-a-cath placement? yes . If applicable, have lifetime cumulative doses been properly documented and assessed? not applicable Lifetime Dose Tracking  . Carboplatin: 2,480 mg = 0.01 % of the maximum lifetime dose of 999,999,999 mg  . Liposomal Doxorubicin: 172.997 mg/m2 (286 mg) = 38.44 % of the maximum lifetime dose of 450 mg/m2  o   Toxicity Monitoring/Prevention: . The patient has the following take home antiemetics prescribed: Ondansetron and Prochlorperazine . The patient has the following take home medications prescribed: N/A . Medication allergies and previous infusion related reactions, if applicable, have been reviewed and addressed. Yes . The patient's current medication list has been assessed for drug-drug interactions  with their chemotherapy regimen. no significant drug-drug interactions were identified on review.  Order Review: . Are the treatment plan orders signed? Yes . Is the patient scheduled to see a provider prior to their treatment? No  I verify that I have reviewed each item in the above checklist and answered each question accordingly.  Norwood Levo [facility] 04/10/2019 1:33 PM

## 2019-04-15 ENCOUNTER — Inpatient Hospital Stay: Payer: PPO

## 2019-04-15 ENCOUNTER — Other Ambulatory Visit: Payer: Self-pay

## 2019-04-15 ENCOUNTER — Inpatient Hospital Stay: Payer: PPO | Attending: Obstetrics

## 2019-04-15 VITALS — BP 134/82 | HR 50 | Temp 98.7°F

## 2019-04-15 DIAGNOSIS — C561 Malignant neoplasm of right ovary: Secondary | ICD-10-CM

## 2019-04-15 DIAGNOSIS — M79606 Pain in leg, unspecified: Secondary | ICD-10-CM

## 2019-04-15 DIAGNOSIS — Z5111 Encounter for antineoplastic chemotherapy: Secondary | ICD-10-CM | POA: Diagnosis not present

## 2019-04-15 DIAGNOSIS — Z7189 Other specified counseling: Secondary | ICD-10-CM

## 2019-04-15 DIAGNOSIS — C786 Secondary malignant neoplasm of retroperitoneum and peritoneum: Secondary | ICD-10-CM | POA: Insufficient documentation

## 2019-04-15 DIAGNOSIS — D61818 Other pancytopenia: Secondary | ICD-10-CM | POA: Diagnosis not present

## 2019-04-15 LAB — CMP (CANCER CENTER ONLY)
ALT: 12 U/L (ref 0–44)
AST: 12 U/L — ABNORMAL LOW (ref 15–41)
Albumin: 3.4 g/dL — ABNORMAL LOW (ref 3.5–5.0)
Alkaline Phosphatase: 74 U/L (ref 38–126)
Anion gap: 11 (ref 5–15)
BUN: 15 mg/dL (ref 8–23)
CO2: 25 mmol/L (ref 22–32)
Calcium: 9.5 mg/dL (ref 8.9–10.3)
Chloride: 106 mmol/L (ref 98–111)
Creatinine: 0.81 mg/dL (ref 0.44–1.00)
GFR, Est AFR Am: >60 mL/min (ref >60)
GFR, Estimated: >60 mL/min (ref >60)
Glucose, Bld: 87 mg/dL (ref 70–99)
Potassium: 3.8 mmol/L (ref 3.5–5.1)
Sodium: 142 mmol/L (ref 135–145)
Total Bilirubin: 0.3 mg/dL (ref 0.3–1.2)
Total Protein: 6.7 g/dL (ref 6.5–8.1)

## 2019-04-15 LAB — CBC WITH DIFFERENTIAL (CANCER CENTER ONLY)
Abs Immature Granulocytes: 0.05 10*3/uL (ref 0.00–0.07)
Basophils Absolute: 0.1 10*3/uL (ref 0.0–0.1)
Basophils Relative: 1 %
Eosinophils Absolute: 0.5 10*3/uL (ref 0.0–0.5)
Eosinophils Relative: 7 %
HCT: 38.4 % (ref 36.0–46.0)
Hemoglobin: 12.2 g/dL (ref 12.0–15.0)
Immature Granulocytes: 1 %
Lymphocytes Relative: 25 %
Lymphs Abs: 1.9 10*3/uL (ref 0.7–4.0)
MCH: 30.6 pg (ref 26.0–34.0)
MCHC: 31.8 g/dL (ref 30.0–36.0)
MCV: 96.2 fL (ref 80.0–100.0)
Monocytes Absolute: 0.6 10*3/uL (ref 0.1–1.0)
Monocytes Relative: 9 %
Neutro Abs: 4.3 10*3/uL (ref 1.7–7.7)
Neutrophils Relative %: 57 %
Platelet Count: 166 10*3/uL (ref 150–400)
RBC: 3.99 MIL/uL (ref 3.87–5.11)
RDW: 13.5 % (ref 11.5–15.5)
WBC Count: 7.4 10*3/uL (ref 4.0–10.5)
nRBC: 0 % (ref 0.0–0.2)

## 2019-04-15 MED ORDER — SODIUM CHLORIDE 0.9 % IV SOLN
800.0000 mg/m2 | Freq: Once | INTRAVENOUS | Status: AC
  Administered 2019-04-15: 11:00:00 1368 mg via INTRAVENOUS
  Filled 2019-04-15: qty 35.98

## 2019-04-15 MED ORDER — SODIUM CHLORIDE 0.9% FLUSH
10.0000 mL | INTRAVENOUS | Status: DC | PRN
  Administered 2019-04-15: 10 mL
  Filled 2019-04-15: qty 10

## 2019-04-15 MED ORDER — PROCHLORPERAZINE MALEATE 10 MG PO TABS
10.0000 mg | ORAL_TABLET | Freq: Once | ORAL | Status: AC
  Administered 2019-04-15: 10 mg via ORAL

## 2019-04-15 MED ORDER — SODIUM CHLORIDE 0.9% FLUSH
10.0000 mL | Freq: Once | INTRAVENOUS | Status: AC
  Administered 2019-04-15: 10 mL
  Filled 2019-04-15: qty 10

## 2019-04-15 MED ORDER — PROCHLORPERAZINE MALEATE 10 MG PO TABS
ORAL_TABLET | ORAL | Status: AC
  Filled 2019-04-15: qty 1

## 2019-04-15 MED ORDER — HEPARIN SOD (PORK) LOCK FLUSH 100 UNIT/ML IV SOLN
500.0000 [IU] | Freq: Once | INTRAVENOUS | Status: AC | PRN
  Administered 2019-04-15: 12:00:00 500 [IU]
  Filled 2019-04-15: qty 5

## 2019-04-15 MED ORDER — SODIUM CHLORIDE 0.9 % IV SOLN
Freq: Once | INTRAVENOUS | Status: DC
  Filled 2019-04-15: qty 250

## 2019-04-15 MED ORDER — SODIUM CHLORIDE 0.9 % IV SOLN
Freq: Once | INTRAVENOUS | Status: AC
  Filled 2019-04-15: qty 250

## 2019-04-15 NOTE — Patient Instructions (Signed)
Gemcitabine injection What is this medicine? GEMCITABINE (jem SYE ta been) is a chemotherapy drug. This medicine is used to treat many types of cancer like breast cancer, lung cancer, pancreatic cancer, and ovarian cancer. This medicine may be used for other purposes; ask your health care provider or pharmacist if you have questions. COMMON BRAND NAME(S): Gemzar, Infugem What should I tell my health care provider before I take this medicine? They need to know if you have any of these conditions:  blood disorders  infection  kidney disease  liver disease  lung or breathing disease, like asthma  recent or ongoing radiation therapy  an unusual or allergic reaction to gemcitabine, other chemotherapy, other medicines, foods, dyes, or preservatives  pregnant or trying to get pregnant  breast-feeding How should I use this medicine? This drug is given as an infusion into a vein. It is administered in a hospital or clinic by a specially trained health care professional. Talk to your pediatrician regarding the use of this medicine in children. Special care may be needed. Overdosage: If you think you have taken too much of this medicine contact a poison control center or emergency room at once. NOTE: This medicine is only for you. Do not share this medicine with others. What if I miss a dose? It is important not to miss your dose. Call your doctor or health care professional if you are unable to keep an appointment. What may interact with this medicine?  medicines to increase blood counts like filgrastim, pegfilgrastim, sargramostim  some other chemotherapy drugs like cisplatin  vaccines Talk to your doctor or health care professional before taking any of these medicines:  acetaminophen  aspirin  ibuprofen  ketoprofen  naproxen This list may not describe all possible interactions. Give your health care provider a list of all the medicines, herbs, non-prescription drugs, or  dietary supplements you use. Also tell them if you smoke, drink alcohol, or use illegal drugs. Some items may interact with your medicine. What should I watch for while using this medicine? Visit your doctor for checks on your progress. This drug may make you feel generally unwell. This is not uncommon, as chemotherapy can affect healthy cells as well as cancer cells. Report any side effects. Continue your course of treatment even though you feel ill unless your doctor tells you to stop. In some cases, you may be given additional medicines to help with side effects. Follow all directions for their use. Call your doctor or health care professional for advice if you get a fever, chills or sore throat, or other symptoms of a cold or flu. Do not treat yourself. This drug decreases your body's ability to fight infections. Try to avoid being around people who are sick. This medicine may increase your risk to bruise or bleed. Call your doctor or health care professional if you notice any unusual bleeding. Be careful brushing and flossing your teeth or using a toothpick because you may get an infection or bleed more easily. If you have any dental work done, tell your dentist you are receiving this medicine. Avoid taking products that contain aspirin, acetaminophen, ibuprofen, naproxen, or ketoprofen unless instructed by your doctor. These medicines may hide a fever. Do not become pregnant while taking this medicine or for 6 months after stopping it. Women should inform their doctor if they wish to become pregnant or think they might be pregnant. Men should not father a child while taking this medicine and for 3 months after stopping it.   There is a potential for serious side effects to an unborn child. Talk to your health care professional or pharmacist for more information. Do not breast-feed an infant while taking this medicine or for at least 1 week after stopping it. Men should inform their doctors if they wish  to father a child. This medicine may lower sperm counts. Talk with your doctor or health care professional if you are concerned about your fertility. What side effects may I notice from receiving this medicine? Side effects that you should report to your doctor or health care professional as soon as possible:  allergic reactions like skin rash, itching or hives, swelling of the face, lips, or tongue  breathing problems  pain, redness, or irritation at site where injected  signs and symptoms of a dangerous change in heartbeat or heart rhythm like chest pain; dizziness; fast or irregular heartbeat; palpitations; feeling faint or lightheaded, falls; breathing problems  signs of decreased platelets or bleeding - bruising, pinpoint red spots on the skin, black, tarry stools, blood in the urine  signs of decreased red blood cells - unusually weak or tired, feeling faint or lightheaded, falls  signs of infection - fever or chills, cough, sore throat, pain or difficulty passing urine  signs and symptoms of kidney injury like trouble passing urine or change in the amount of urine  signs and symptoms of liver injury like dark yellow or brown urine; general ill feeling or flu-like symptoms; light-colored stools; loss of appetite; nausea; right upper belly pain; unusually weak or tired; yellowing of the eyes or skin  swelling of ankles, feet, hands Side effects that usually do not require medical attention (report to your doctor or health care professional if they continue or are bothersome):  constipation  diarrhea  hair loss  loss of appetite  nausea  rash  vomiting This list may not describe all possible side effects. Call your doctor for medical advice about side effects. You may report side effects to FDA at 1-800-FDA-1088. Where should I keep my medicine? This drug is given in a hospital or clinic and will not be stored at home. NOTE: This sheet is a summary. It may not cover all  possible information. If you have questions about this medicine, talk to your doctor, pharmacist, or health care provider.  2020 [employer] (2017-03-22 18:06:11)  

## 2019-04-16 ENCOUNTER — Telehealth: Payer: Self-pay

## 2019-04-16 NOTE — Telephone Encounter (Signed)
Called to follow up after new treatment yesterday. She is doing well with no complaints. Instructed to call the office if needed. She verbalized understanding.

## 2019-04-16 NOTE — Progress Notes (Signed)
Pharmacist Chemotherapy Monitoring - Follow Up Assessment    I verify that I have reviewed each item in the below checklist:  . Regimen for the patient is scheduled for the appropriate day and plan matches scheduled date. Marland Kitchen Appropriate non-routine labs are ordered dependent on drug ordered. . If applicable, additional medications reviewed and ordered per protocol based on lifetime cumulative doses and/or treatment regimen.   Plan for follow-up and/or issues identified: No . I-vent associated with next due treatment: No . MD and/or nursing notified: No  Philomena Course 04/16/2019 3:40 PM

## 2019-04-16 NOTE — Telephone Encounter (Signed)
-----   Message from Lin Givens, RN sent at 04/15/2019 11:45 AM EDT ----- Regarding: Dr Alvy Bimler first time [facility] Tolerated well

## 2019-04-22 ENCOUNTER — Encounter: Payer: Self-pay | Admitting: Hematology and Oncology

## 2019-04-22 ENCOUNTER — Other Ambulatory Visit: Payer: Self-pay

## 2019-04-22 ENCOUNTER — Inpatient Hospital Stay: Payer: PPO

## 2019-04-22 ENCOUNTER — Inpatient Hospital Stay: Payer: PPO | Admitting: Hematology and Oncology

## 2019-04-22 DIAGNOSIS — C561 Malignant neoplasm of right ovary: Secondary | ICD-10-CM

## 2019-04-22 DIAGNOSIS — Z7189 Other specified counseling: Secondary | ICD-10-CM

## 2019-04-22 DIAGNOSIS — R1084 Generalized abdominal pain: Secondary | ICD-10-CM

## 2019-04-22 DIAGNOSIS — D61818 Other pancytopenia: Secondary | ICD-10-CM

## 2019-04-22 DIAGNOSIS — R590 Localized enlarged lymph nodes: Secondary | ICD-10-CM

## 2019-04-22 DIAGNOSIS — M79606 Pain in leg, unspecified: Secondary | ICD-10-CM

## 2019-04-22 DIAGNOSIS — I10 Essential (primary) hypertension: Secondary | ICD-10-CM

## 2019-04-22 DIAGNOSIS — Z5111 Encounter for antineoplastic chemotherapy: Secondary | ICD-10-CM | POA: Diagnosis not present

## 2019-04-22 LAB — CBC WITH DIFFERENTIAL (CANCER CENTER ONLY)
Abs Immature Granulocytes: 0.02 10*3/uL (ref 0.00–0.07)
Basophils Absolute: 0 10*3/uL (ref 0.0–0.1)
Basophils Relative: 1 %
Eosinophils Absolute: 0.2 10*3/uL (ref 0.0–0.5)
Eosinophils Relative: 5 %
HCT: 34 % — ABNORMAL LOW (ref 36.0–46.0)
Hemoglobin: 11 g/dL — ABNORMAL LOW (ref 12.0–15.0)
Immature Granulocytes: 1 %
Lymphocytes Relative: 47 %
Lymphs Abs: 1.8 10*3/uL (ref 0.7–4.0)
MCH: 30.1 pg (ref 26.0–34.0)
MCHC: 32.4 g/dL (ref 30.0–36.0)
MCV: 93.2 fL (ref 80.0–100.0)
Monocytes Absolute: 0.5 10*3/uL (ref 0.1–1.0)
Monocytes Relative: 13 %
Neutro Abs: 1.3 10*3/uL — ABNORMAL LOW (ref 1.7–7.7)
Neutrophils Relative %: 33 %
Platelet Count: 95 10*3/uL — ABNORMAL LOW (ref 150–400)
RBC: 3.65 MIL/uL — ABNORMAL LOW (ref 3.87–5.11)
RDW: 13 % (ref 11.5–15.5)
WBC Count: 3.9 10*3/uL — ABNORMAL LOW (ref 4.0–10.5)
nRBC: 0 % (ref 0.0–0.2)

## 2019-04-22 LAB — CMP (CANCER CENTER ONLY)
ALT: 12 U/L (ref 0–44)
AST: 14 U/L — ABNORMAL LOW (ref 15–41)
Albumin: 3.2 g/dL — ABNORMAL LOW (ref 3.5–5.0)
Alkaline Phosphatase: 78 U/L (ref 38–126)
Anion gap: 8 (ref 5–15)
BUN: 7 mg/dL — ABNORMAL LOW (ref 8–23)
CO2: 26 mmol/L (ref 22–32)
Calcium: 8.9 mg/dL (ref 8.9–10.3)
Chloride: 108 mmol/L (ref 98–111)
Creatinine: 0.73 mg/dL (ref 0.44–1.00)
GFR, Est AFR Am: >60 mL/min (ref >60)
GFR, Estimated: >60 mL/min (ref >60)
Glucose, Bld: 86 mg/dL (ref 70–99)
Potassium: 3.7 mmol/L (ref 3.5–5.1)
Sodium: 142 mmol/L (ref 135–145)
Total Bilirubin: 0.3 mg/dL (ref 0.3–1.2)
Total Protein: 6.8 g/dL (ref 6.5–8.1)

## 2019-04-22 MED ORDER — PROCHLORPERAZINE MALEATE 10 MG PO TABS
ORAL_TABLET | ORAL | Status: AC
  Filled 2019-04-22: qty 1

## 2019-04-22 MED ORDER — PROCHLORPERAZINE MALEATE 10 MG PO TABS
10.0000 mg | ORAL_TABLET | Freq: Once | ORAL | Status: AC
  Administered 2019-04-22: 11:00:00 10 mg via ORAL

## 2019-04-22 MED ORDER — SODIUM CHLORIDE 0.9 % IV SOLN
600.0000 mg/m2 | Freq: Once | INTRAVENOUS | Status: AC
  Administered 2019-04-22: 12:00:00 1026 mg via INTRAVENOUS
  Filled 2019-04-22: qty 26.98

## 2019-04-22 MED ORDER — SODIUM CHLORIDE 0.9% FLUSH
10.0000 mL | INTRAVENOUS | Status: DC | PRN
  Administered 2019-04-22: 13:00:00 10 mL
  Filled 2019-04-22: qty 10

## 2019-04-22 MED ORDER — HEPARIN SOD (PORK) LOCK FLUSH 100 UNIT/ML IV SOLN
500.0000 [IU] | Freq: Once | INTRAVENOUS | Status: AC | PRN
  Administered 2019-04-22: 13:00:00 500 [IU]
  Filled 2019-04-22: qty 5

## 2019-04-22 MED ORDER — SODIUM CHLORIDE 0.9 % IV SOLN
Freq: Once | INTRAVENOUS | Status: AC
  Filled 2019-04-22: qty 250

## 2019-04-22 NOTE — Progress Notes (Signed)
[place] OFFICE PROGRESS NOTE  Patient Care Team: Martinique, Cielo G, MD as PCP - General (Family Medicine) Troy Sine, MD as PCP - Cardiology (Cardiology) Awanda Mink Craige Cotta, RN as Oncology Nurse Navigator (Oncology)  ASSESSMENT & PLAN:  Right ovarian epithelial cancer [facility]) She tolerated treatment well except for pancytopenia The lymph node is about the same I plan to reduce the dose of her chemotherapy and modify her treatment plan to days 1, 8 and rest day 15, for cycle of every 21 days I recommend minimum 3 cycles of treatment before repeat imaging study  Lymphadenopathy, axillary The enlarged lymph node is palpable but not worse We will monitor carefully  Pancytopenia, acquired ([place]) Planned dose adjustment We will proceed with treatment I also plan to modify her chemotherapy schedule as above  Essential hypertension, benign She has intermittently elevated blood pressure but not symptomatic We will observe closely for now  Abdominal pain She has mild intermittent abdominal discomfort I recommend over-the-counter analgesics   No orders of the defined types were placed in this encounter.   All questions were answered. The patient knows to call the clinic with any problems, questions or concerns. The total time spent in the appointment was 30 minutes encounter with patients including review of chart and various tests results, discussions about plan of care and coordination of care plan   Heath Lark, MD 04/22/2019 10:21 AM  INTERVAL HISTORY: Please see below for problem oriented charting. She returns for cycle 1, day 8 treatment with her husband and daughter She has some soreness in her left arm since biopsy but overall slowly improving She has intermittent abdominal discomfort but no recent nausea or changes in bowel habits She has not taken any analgesics for this No recent infection, fever or chills The patient denies any recent signs or symptoms of  bleeding such as spontaneous epistaxis, hematuria or hematochezia.   SUMMARY OF ONCOLOGIC HISTORY: Oncology History Overview Note  Neg for genetics blood work and HRD High grade serous, recurrent biopsy proven PD-L1 5%   Right ovarian epithelial cancer ([place])  01/06/2018 Imaging   Ct abdomen and pelvis 1. Complex cystic mass at the right adnexa, measuring 5.9 x 4.0 cm, with nodular components, concerning for primary ovarian malignancy. 2. Diffuse nodularity along the omentum at the left side of the abdomen, extending into the mesentery at the left mid abdomen, concerning for peritoneal carcinomatosis. 3. Wall thickening at the distal ileum adjacent to the ovarian mass; bowel loops appear somewhat adherent to the ovarian mass. Bowel infiltration with tumor cannot be excluded. No evidence of bowel obstruction at this time. 4. Small volume ascites within the abdomen and pelvis.  Aortic Atherosclerosis (ICD10-I70.0).   01/08/2018 Tumor Marker   Patient's tumor was tested for the following markers: CA-125. Results of the tumor marker test revealed 3004   01/15/2018 Imaging   Chest CT:  1. No active cardiopulmonary disease. 2. Aortic atherosclerosis without aneurysm or dissection. 3. No large central pulmonary embolus.  CT AP:  1. Dilated fluid-filled loops of small bowel are redemonstrated slightly more extensive than on prior exam with transition point likely in the right adnexa adjacent to a complex cystic mass concerning for ovarian neoplasm given septations and soft tissue nodularity. This raises concern for early or partial SBO. This soft tissue mass measures 4.9 x 4 x 4.7 cm and has not changed since prior recent comparison. Additional short segmental area of luminal narrowing is noted in the right lower quadrant  involving small bowel for which stigmata of peritoneal carcinomatosis or small-bowel metastatic implants might account for this. 2. Redemonstration of small volume of  ascites predominantly in the upper abdomen surrounding the liver and spleen. 3. Redemonstration of thick bandlike omental thickening concerning for peritoneal carcinomatosis.    01/15/2018 Procedure   Successful ultrasound-guided diagnostic and therapeutic paracentesis yielding 1.5 liters of peritoneal fluid.    01/15/2018 Pathology Results   PERITONEAL/ASCITIC FLUID (SPECIMEN 1 OF 1 COLLECTED 01/18/18): MALIGNANT CELLS CONSISTENT WITH METASTATIC ADENOCARCINOMA. SEE COMMENT. COMMENT: THE MALIGNANT CELLS ARE POSITIVE FOR MOC-31, CYTOKERATIN 7, ESTROGEN RECEPTOR, PAX-8, AND WT-1. THEY ARE NEGATIVE FOR CALRETININ, CYTOKERATIN 5/6, AND CYTOKERATIN 20. THE PROFILE IS CONSISTENT WITH A PRIMARY GYNECOLOGIC CARCINOMA. THERE IS LIKELY SUFFICIENT TUMOR PRESENT, IF ADDITIONAL STUDIES ARE REQUESTED.   01/15/2018 - 02/02/2018 Hospital Admission   She was admitted to the hospital for SBO. She was treated with chemotherapy   01/18/2018 - 06/20/2018 Chemotherapy   The patient had carboplatin and taxol   02/08/2018 Cancer Staging   Staging form: Ovary, Fallopian Tube, and Primary Peritoneal Carcinoma, AJCC 8th Edition - Clinical: cT3, cN0, cM0 - Signed by Heath Lark, MD on 02/08/2018   02/08/2018 Tumor Marker   Patient's tumor was tested for the following markers: CA-125. Results of the tumor marker test revealed 445   03/02/2018 Tumor Marker   Patient's tumor was tested for the following markers: CA-125. Results of the tumor marker test revealed 174   03/15/2018 Imaging   6.2 cm complex cystic right ovarian mass, compatible with malignant ovarian neoplasm, mildly progressive.  Associated peritoneal disease/omental caking beneath the anterior abdominal wall, mildly improved. Prior abdominal ascites is improved/resolved.  4.7 cm cystic left ovarian mass, without overt malignant features, grossly unchanged.  Mild bilateral hydroureteronephrosis, secondary to extrinsic compression, new.  Prior small bowel  obstruction has improved/resolved.    03/27/2018 Pathology Results   1. Omentum, resection for tumor - OMENTUM: - HIGH GRADE SEROUS CARCINOMA. 2. Ovary, left - LEFT OVARY: - HIGH GRADE SEROUS CARCINOMA. - LEFT FALLOPIAN TUBE: - HIGH GRADE SEROUS CARCINOMA. 3. Adnexa - ovary +/- tube, neoplastic, right - RIGHT OVARY: - HIGH GRADE SEROUS CARCINOMA, 5.5 CM. - RIGHT FALLOPIAN TUBE: - HIGH GRADE SEROUS CARCINOMA. 4. Peritoneum, biopsy, nodule - PERITONEUM: - NODULES OF HIGH GRADE SEROUS CARCINOMA. 5. Peritoneum, resection for tumor, rectosigmoid - RECTOSIGMOID PERITONEUM: - HIGH GRADE SEROUS CARCINOMA. Microscopic Comment 3. OVARY or FALLOPIAN TUBE or PRIMARY PERITONEUM: Procedure: Bilateral salpingo-oophorectomy, omentectomy and peritoneal biopsies. Specimen Integrity: N/A. Tumor Site: Right ovary. Ovarian Surface Involvement (required only if applicable): Yes. Fallopian Tube Surface Involvement (required only if applicable): Yes. Tumor Size: 5.5 x 4.8 x 4.0 cm. Histologic Type: Serous carcinoma. Histologic Grade: High grade. Implants (required for advanced stage serous/seromucinous borderline tumors only): Omentum, left ovary and fallopian tube, rectosigmoid peritoneum and peritoneum. Other Tissue/ Organ Involvement: Left ovary and fallopian tube, omentum and rectosigmoid peritoneum. Largest Extrapelvic Peritoneal Focus (required only if applicable): 49.6 cm, omentum. Peritoneal/Ascitic Fluid: N/A. Treatment Effect (required only for high-grade serous carcinomas): Minimal. Regional Lymph Nodes: No lymph nodes submitted. Pathologic Stage Classification (pTNM, AJCC 8th Edition): pT3c, pNX. Representative Tumor Block: 3A, 3B, 3D, 3E and 70F. Comment(s): There is a 5.5 cm in greatest dimension partially cystic high grade serous carcinoma involving the right adnexal specimen and the tumor is staged as a primary right ovarian carcinoma. The carcinoma also involves the left ovary as  well as the left fallopian tube, the omentum, peritoneum and the rectosigmoid  peritoneal specimens.   03/27/2018 Surgery   Preoperative Diagnosis: ovarian cancer, stage IIIC, metastatic to omentum, peritoneum, serosa of intestines   Procedure(s) Performed: 1. Exploratory laparotomy with bilateral salpingo-oophorectomy, omentectomy radical tumor debulking for ovarian cancer .  Surgeon: Thereasa Solo, MD.   Specimens: Bilateral tubes / ovaries, omentum. Peritoneal nodules, rectosigmoid nodules.    Operative Findings: omental cake, miliary studding on diaphragm and bilateral paracolic gutters. Sigmoid colon densely adherent to left and right ovaries with tumor rind, ureters mildly dilated bilaterally due to retroperitoneal extension of the tumor towards ureters causing compression.    This represented an optimal cytoreduction (R1) with gross residual disease on the diaphragm that had been ablated and a thin tumor plaque on the sigmoid colon that was ablated. No residual disease >1cm remaining   05/07/2018 Genetic Testing   Negative genetic testing on the [employer] TumorNextHRD+ CancerNext Panel. The CancerNext gene panel offered by [employer] includes sequencing and rearrangement analysis for the following 34 genes:   APC, ATM, BARD1, BMPR1A, BRCA1, BRCA2, BRIP1, CDH1, CDK4, CDKN2A, CHEK2, DICER1, EPCAM, GREM1, HOXB13, MLH1, MRE11A, MSH2, MSH6, MUTYH, NBN, NF1, PALB2, PMS2, POLD1, POLE, PTEN, RAD50, RAD51C, RAD51D, SMAD4, SMARCA4, STK11, and TP53. Somatic genes analyzed through TumorNext-HRD: ATM, BARD1, BRCA1, BRCA2, BRIP1, CHEK2, MRE11A, NBN, PALB2, RAD51C, RAD51D. The report date is 05/07/2018.   05/11/2018 Tumor Marker   Patient's tumor was tested for the following markers: CA-125. Results of the tumor marker test revealed 60.5   07/02/2018 Imaging   1. Mild right pericardiophrenic adenopathy is mildly increased, suspicious for metastatic nodes. No abdominopelvic adenopathy. 2. Small left  peritoneal soft tissue nodule adjacent to the splenic flexure and smooth left pelvic sidewall peritoneal thickening, cannot exclude residual/recurrent disease. Attention on follow-up CT advised. 3. Bilateral renal collecting system dilatation has improved. 4.  Aortic Atherosclerosis (ICD10-I70.0).   07/02/2018 Tumor Marker   Patient's tumor was tested for the following markers: CA-125 Results of the tumor marker test revealed 42.7   07/11/2018 Echocardiogram   1. The left ventricle has normal systolic function with an ejection fraction of 60-65%. The cavity size was normal. Left ventricular diastolic parameters were normal.  2. Normal GLS -20.1.  3. The right ventricle has normal systolic function. The cavity was normal. There is no increase in right ventricular wall thickness.  4. The mitral valve is degenerative. Moderate thickening of the mitral valve leaflet. Moderate calcification of the mitral valve leaflet.  5. The aortic valve is tricuspid. Moderate thickening of the aortic valve. Moderate calcification of the aortic valve. Aortic valve regurgitation is trivial by color flow Doppler.  6. The aortic root is normal in size and structure.    Chemotherapy   The patient had doxorubicin and bevacizumab for chemotherapy treatment.     07/30/2018 Tumor Marker   Patient's tumor was tested for the following markers: CA-125 Results of the tumor marker test revealed 82.6   08/27/2018 Tumor Marker   Patient's tumor was tested for the following markers: CA-125 Results of the tumor marker test revealed 111.   09/24/2018 Tumor Marker   Patient's tumor was tested for the following markers: CA-125 Results of the tumor marker test revealed 142.   10/05/2018 Imaging   1. Stable right juxta diaphragmatic/pericardiophrenic lymph node, mildly enlarged. 2. Otherwise no definite metastatic disease identified in the abdomen/pelvis. 3. 3 mm peritoneal nodule noted left paracolic gutter on today's study,  indeterminate. Attention on follow-up recommended. 4.  Aortic Atherosclerois (ICD10-170.0)   10/16/2018 Echocardiogram  1. No significant change from prior study (07/11/2018).  2. Left ventricular ejection fraction, by visual estimation, is 60 to 65%. The left ventricle has normal function. Normal left ventricular size. There is no left ventricular hypertrophy.  3. LVEF by 3D assessment 63%.  4. The average left ventricular global longitudinal strain is -22.4 %.  5. Global right ventricle has normal systolic function.The right ventricular size is normal. No increase in right ventricular wall thickness.  6. Left atrial size was normal.  7. Right atrial size was normal.  8. Presence of pericardial fat pad.  9. Moderate thickening of the mitral valve leaflet(s). 10. The mitral valve is normal in structure. Trace mitral valve regurgitation. 11. The tricuspid valve is normal in structure. Tricuspid valve regurgitation is mild. 12. Aortic regurgitation PHT measures 467 msec. 13. The aortic valve is tricuspid Aortic valve regurgitation is mild by color flow Doppler. Mild aortic valve sclerosis without stenosis. 14. There is mild to moderate calcification of the AoV, with focal calcification of the [place]. The aortic regurgitation is mild in severity, likely related to degenerative valve disease. 15. The pulmonic valve was grossly normal. Pulmonic valve regurgitation is not visualized by color flow Doppler. 16. Mild plaque invoving the ascending aorta. 17. Normal pulmonary artery systolic pressure. 18. The tricuspid regurgitant velocity is 2.52 m/s, and with an assumed right atrial pressure of 3 mmHg, the estimated right ventricular systolic pressure is normal at 28.4 mmHg.   10/26/2018 Tumor Marker   Patient's tumor was tested for the following markers: CA-125 Results of the tumor marker test revealed 196   12/11/2018 Imaging   1. No substantial interval change in exam. No definite findings to  suggest recurrent/metastatic disease. 2. Right pericardial phrenic lymph node noted on multiple prior studies is unchanged in the interval. 3. 8 mm omental nodule identified on today's exam. Close attention on follow-up recommended. 3 mm soft tissue nodule along the left paracolic gutter seen previously has resolved in the interval. 4.  Aortic Atherosclerois (ICD10-170.0)   04/02/2019 Imaging   1. Interval development of left axillary and subpectoral lymphadenopathy is highly concerning for metastatic disease, however, this would be a highly unusual pattern of metastatic spread for an ovarian primary neoplasm. This is most concerning for potential left-sided breast cancer. Correlation with mammography is strongly recommended. 2. No other signs of definite metastatic disease noted elsewhere in the chest, abdomen or pelvis. 3. Aortic atherosclerosis. 4. Additional incidental findings, similar to prior studies, as above.     04/03/2019 Pathology Results   Immunohistochemistry shows the carcinoma is positive with cytokeratin AE1/AE3, cytokeratin 7, PAX-8, WT-1, and shows patchy positivity with estrogen receptor. The carcinoma is negative with GATA3, GCDFP, p53, Napsin A and TTF-1. The immunophenotype is most consistent with metastatic high grade serous carcinoma. (JDP:ah 04/04/19)FINAL DIAGNOSIS Diagnosis Lymph node, needle/core biopsy, left axilla - METASTATIC CARCINOMA. - SEE MICROSCOPIC DESCRIPT    Genetic Testing   Patient has genetic testing done for PD-L1. Results revealed patient has the following: PD-L1 staining in tumor cells (TC): 3% PD-L1 staining in tumor-associated immune cells (IC): 10% PD-L1 combined positive score (CPS): 5%   04/15/2019 -  Chemotherapy   The patient had gemzar for chemotherapy treatment.       REVIEW OF SYSTEMS:   Constitutional: Denies fevers, chills or abnormal weight loss Eyes: Denies blurriness of vision Ears, nose, mouth, throat, and face: Denies  mucositis or sore throat Respiratory: Denies cough, dyspnea or wheezes Cardiovascular: Denies palpitation, chest discomfort or lower  extremity swelling Gastrointestinal:  Denies nausea, heartburn or change in bowel habits Skin: Denies abnormal skin rashes Neurological:Denies numbness, tingling or new weaknesses Behavioral/Psych: Mood is stable, no new changes  All other systems were reviewed with the patient and are negative.  I have reviewed the past medical history, past surgical history, social history and family history with the patient and they are unchanged from previous note.  ALLERGIES:  is allergic to tramadol hcl.  MEDICATIONS:  Current Outpatient Medications  Medication Sig Dispense Refill  . acetaminophen (TYLENOL) 500 MG tablet Take 2 tablets (1,000 mg total) by mouth every 6 (six) hours. (Patient taking differently: Take 1,000 mg by mouth every 6 (six) hours as needed. ) 30 tablet 0  . ALPRAZolam (XANAX) 0.5 MG tablet TAKE 1/2 TO 1 (ONE-HALF TO ONE) TABLET BY MOUTH ONCE DAILY AS NEEDED FOR ANXIETY 30 tablet 2  . alum & mag hydroxide-simeth (MAALOX/MYLANTA) 200-200-20 MG/5ML suspension Take 30 mLs by mouth every 6 (six) hours as needed for indigestion or heartburn. 355 mL 0  . Artificial Saliva (SALIVASURE) LOZG Use as directed 1 lozenge in the mouth or throat every 4 (four) hours as needed. 90 lozenge 0  . Calcium Carbonate (CALTRATE 600) 1500 MG TABS Take 600 mg of elemental calcium by mouth daily.     . cholecalciferol (VITAMIN D) 1000 UNITS tablet Take 1,000 Units by mouth daily.     Marland Kitchen estradiol (ESTRACE) 0.1 MG/GM vaginal cream Place 1 Applicatorful vaginally 3 (three) times a week. 42.5 g 12  . fluticasone (FLONASE) 50 MCG/ACT nasal spray Place 2 sprays into both nostrils daily. 48 g 2  . gabapentin (NEURONTIN) 300 MG capsule Take 1 capsule (300 mg total) by mouth 2 (two) times daily. 60 capsule 11  . HYDROmorphone (DILAUDID) 2 MG tablet Take 1 tablet (2 mg total) by mouth  every 4 (four) hours as needed for severe pain. 30 tablet 0  . ibuprofen (ADVIL,MOTRIN) 200 MG tablet Take 600 mg by mouth every 6 (six) hours as needed for moderate pain.     Marland Kitchen lactose free nutrition (BOOST PLUS) LIQD Take 237 mLs by mouth daily. (Patient taking differently: Take 237 mLs by mouth 2 (two) times daily between meals. ) 10 Can 0  . levothyroxine (SYNTHROID) 75 MCG tablet Take 1 tablet (75 mcg total) by mouth daily. 90 tablet 3  . lidocaine-prilocaine (EMLA) cream Apply to affected area once 30 g 3  . Magnesium 400 MG TABS Take 250 mg by mouth daily.    . Menthol-Methyl Salicylate (MUSCLE RUB) 10-15 % CREA Apply 1 application topically as needed for muscle pain.    . metoprolol succinate (TOPROL-XL) 50 MG 24 hr tablet Take 1 tablet (50 mg total) by mouth daily. Take with or immediately following a meal. 90 tablet 2  . Multiple Vitamins-Minerals (MULTIVITAMIN WITH MINERALS) tablet Take 1 tablet by mouth daily.      Marland Kitchen nystatin (MYCOSTATIN) 100000 UNIT/ML suspension Take 5 mLs (500,000 Units total) by mouth 4 (four) times daily. 473 mL 0  . Omega-3 Fatty Acids (FISH OIL) 1000 MG CAPS Take 2,000 mg by mouth 2 (two) times daily.     Marland Kitchen omeprazole (PRILOSEC) 40 MG capsule Take 1 capsule (40 mg total) by mouth daily. 90 capsule 3  . ondansetron (ZOFRAN) 8 MG tablet Take 1 tablet (8 mg total) by mouth every 8 (eight) hours as needed (Nausea or vomiting). 30 tablet 1  . polyethylene glycol (MIRALAX / GLYCOLAX) packet Take 17 g by  mouth daily. 14 each 0  . prochlorperazine (COMPAZINE) 10 MG tablet Take 1 tablet (10 mg total) by mouth every 6 (six) hours as needed (Nausea or vomiting). 30 tablet 1  . senna (SENOKOT) 8.6 MG TABS tablet Take 1 tablet (8.6 mg total) by mouth at bedtime as needed for mild constipation. 120 each 0  . simvastatin (ZOCOR) 20 MG tablet Take 1 tablet (20 mg total) by mouth at bedtime. 90 tablet 1  . vitamin C (ASCORBIC ACID) 500 MG tablet Take 1,000 mg by mouth daily.    [name] 20 MG TABS tablet TAKE 1 TABLET BY MOUTH ONCE DAILY WITH  SUPPER 90 tablet 1   No current facility-administered medications for this visit.    PHYSICAL EXAMINATION: ECOG PERFORMANCE STATUS: 1 - Symptomatic but completely ambulatory  Vitals:   04/22/19 1011  BP: (!) 153/73  Pulse: (!) 58  Resp: 18  Temp: 98.9 F (37.2 C)  SpO2: 100%   Filed Weights   04/22/19 1011  Weight: 146 lb 12.8 oz (66.6 kg)    GENERAL:alert, no distress and comfortable SKIN: skin color, texture, turgor are normal, no rashes or significant lesions EYES: normal, Conjunctiva are pink and non-injected, sclera clear OROPHARYNX:no exudate, no erythema and lips, buccal mucosa, and tongue normal  NECK: supple, thyroid normal size, non-tender, without nodularity LYMPH: She has enlarged palpable lymph node on the left axilla  LUNGS: clear to auscultation and percussion with normal breathing effort HEART: regular rate & rhythm and no murmurs and no lower extremity edema ABDOMEN:abdomen soft, non-tender and normal bowel sounds Musculoskeletal:no cyanosis of digits and no clubbing  NEURO: alert & oriented x 3 with fluent speech, no focal motor/sensory deficits  LABORATORY DATA:  I have reviewed the data as listed    Component Value Date/Time   NA 142 04/15/2019 0939   K 3.8 04/15/2019 0939   CL 106 04/15/2019 0939   CO2 25 04/15/2019 0939   GLUCOSE 87 04/15/2019 0939   BUN 15 04/15/2019 0939   CREATININE 0.81 04/15/2019 0939   CREATININE 0.76 11/17/2015 1005   CALCIUM 9.5 04/15/2019 0939   PROT 6.7 04/15/2019 0939   ALBUMIN 3.4 (L) 04/15/2019 0939   AST 12 (L) 04/15/2019 0939   ALT 12 04/15/2019 0939   ALKPHOS 74 04/15/2019 0939   BILITOT 0.3 04/15/2019 0939   GFRNONAA >60 04/15/2019 0939   GFRAA >60 04/15/2019 0939    No results found for: SPEP, UPEP  Lab Results  Component Value Date   WBC 3.9 (L) 04/22/2019   NEUTROABS 1.3 (L) 04/22/2019   HGB 11.0 (L) 04/22/2019   HCT 34.0 (L)  04/22/2019   MCV 93.2 04/22/2019   PLT 95 (L) 04/22/2019      Chemistry      Component Value Date/Time   NA 142 04/15/2019 0939   K 3.8 04/15/2019 0939   CL 106 04/15/2019 0939   CO2 25 04/15/2019 0939   BUN 15 04/15/2019 0939   CREATININE 0.81 04/15/2019 0939   CREATININE 0.76 11/17/2015 1005      Component Value Date/Time   CALCIUM 9.5 04/15/2019 0939   ALKPHOS 74 04/15/2019 0939   AST 12 (L) 04/15/2019 0939   ALT 12 04/15/2019 0939   BILITOT 0.3 04/15/2019 0939       RADIOGRAPHIC STUDIES: I have personally reviewed the radiological images as listed and agreed with the findings in the report. CT CHEST W CONTRAST  Result Date: 04/02/2019 CLINICAL DATA:  74 year old female with  history of ovarian cancer. Staging examination. Palpable left axillary lymph node enlargement. EXAM: CT CHEST, ABDOMEN, AND PELVIS WITH CONTRAST TECHNIQUE: Multidetector CT imaging of the chest, abdomen and pelvis was performed following the standard protocol during bolus administration of intravenous contrast. CONTRAST:  1m OMNIPAQUE IOHEXOL 300 MG/ML  SOLN COMPARISON:  CT the abdomen and pelvis 12/11/2018. CT the chest, abdomen and pelvis 01/15/2018. FINDINGS: CT CHEST FINDINGS Cardiovascular: Heart size is normal. There is no significant pericardial fluid, thickening or pericardial calcification. Aortic atherosclerosis. No definite coronary artery calcifications. Right internal jugular single-lumen porta cath with tip terminating in the distal superior vena cava. Mediastinum/Nodes: Borderline enlarged anterior mediastinal lymph node adjacent to the right atrioventricular groove (axial image 48 of series 3) currently measuring 1.1 cm in short axis, similar to recent prior examinations. No other pathologically enlarged mediastinal or hilar lymph nodes are noted. Esophagus is unremarkable in appearance. Numerous left axillary lymph nodes and left subpectoral lymph nodes appear increased in size compared to the  prior examination. The largest left axillary lymph nodes measure up to 1.5 cm in short axis, while the largest subpectoral lymph node measures up to 1.1 cm in short axis. Lungs/Pleura: No suspicious pulmonary nodules or masses are noted. No acute consolidative airspace disease. No pleural effusions. Musculoskeletal: There are no aggressive appearing lytic or blastic lesions noted in the visualized portions of the skeleton. CT ABDOMEN PELVIS FINDINGS Hepatobiliary: 1 cm low-attenuation lesion in segment 2 of the liver, similar to the prior study, compatible with a simple cyst. No other suspicious hepatic lesions. No intra or extrahepatic biliary ductal dilatation. Gallbladder is normal in appearance. Pancreas: No pancreatic mass. No pancreatic ductal dilatation. No pancreatic or peripancreatic fluid collections or inflammatory changes. Spleen: Unremarkable. Adrenals/Urinary Tract: Bilateral kidneys and bilateral adrenal glands are normal in appearance. No hydroureteronephrosis. Urinary bladder is normal in appearance. Stomach/Bowel: Normal appearance of the stomach. No pathologic dilatation of small bowel or colon. The appendix is not confidently identified and may be surgically absent. Regardless, there are no inflammatory changes noted adjacent to the cecum to suggest the presence of an acute appendicitis at this time. Vascular/Lymphatic: Aortic atherosclerosis, without evidence of aneurysm or dissection in the abdominal or pelvic vasculature. No lymphadenopathy noted in the abdomen or pelvis. Reproductive: Status post total abdominal hysterectomy and bilateral salpingo-oophorectomy. No unexpected soft tissue mass in the low anatomic pelvis to suggest locally recurrent disease. Other: Small ventral hernia containing only omental fat. No significant volume of ascites. No pneumoperitoneum. Musculoskeletal: There are no aggressive appearing lytic or blastic lesions noted in the visualized portions of the skeleton.  IMPRESSION: 1. Interval development of left axillary and subpectoral lymphadenopathy is highly concerning for metastatic disease, however, this would be a highly unusual pattern of metastatic spread for an ovarian primary neoplasm. This is most concerning for potential left-sided breast cancer. Correlation with mammography is strongly recommended. 2. No other signs of definite metastatic disease noted elsewhere in the chest, abdomen or pelvis. 3. Aortic atherosclerosis. 4. Additional incidental findings, similar to prior studies, as above. Electronically Signed   By: DVinnie LangtonM.D.   On: 04/02/2019 12:19   CT ABDOMEN PELVIS W CONTRAST  Result Date: 04/02/2019 CLINICAL DATA:  74year old female with history of ovarian cancer. Staging examination. Palpable left axillary lymph node enlargement. EXAM: CT CHEST, ABDOMEN, AND PELVIS WITH CONTRAST TECHNIQUE: Multidetector CT imaging of the chest, abdomen and pelvis was performed following the standard protocol during bolus administration of intravenous contrast. CONTRAST:  855mOMNIPAQUE IOHEXOL  300 MG/ML  SOLN COMPARISON:  CT the abdomen and pelvis 12/11/2018. CT the chest, abdomen and pelvis 01/15/2018. FINDINGS: CT CHEST FINDINGS Cardiovascular: Heart size is normal. There is no significant pericardial fluid, thickening or pericardial calcification. Aortic atherosclerosis. No definite coronary artery calcifications. Right internal jugular single-lumen porta cath with tip terminating in the distal superior vena cava. Mediastinum/Nodes: Borderline enlarged anterior mediastinal lymph node adjacent to the right atrioventricular groove (axial image 48 of series 3) currently measuring 1.1 cm in short axis, similar to recent prior examinations. No other pathologically enlarged mediastinal or hilar lymph nodes are noted. Esophagus is unremarkable in appearance. Numerous left axillary lymph nodes and left subpectoral lymph nodes appear increased in size compared to  the prior examination. The largest left axillary lymph nodes measure up to 1.5 cm in short axis, while the largest subpectoral lymph node measures up to 1.1 cm in short axis. Lungs/Pleura: No suspicious pulmonary nodules or masses are noted. No acute consolidative airspace disease. No pleural effusions. Musculoskeletal: There are no aggressive appearing lytic or blastic lesions noted in the visualized portions of the skeleton. CT ABDOMEN PELVIS FINDINGS Hepatobiliary: 1 cm low-attenuation lesion in segment 2 of the liver, similar to the prior study, compatible with a simple cyst. No other suspicious hepatic lesions. No intra or extrahepatic biliary ductal dilatation. Gallbladder is normal in appearance. Pancreas: No pancreatic mass. No pancreatic ductal dilatation. No pancreatic or peripancreatic fluid collections or inflammatory changes. Spleen: Unremarkable. Adrenals/Urinary Tract: Bilateral kidneys and bilateral adrenal glands are normal in appearance. No hydroureteronephrosis. Urinary bladder is normal in appearance. Stomach/Bowel: Normal appearance of the stomach. No pathologic dilatation of small bowel or colon. The appendix is not confidently identified and may be surgically absent. Regardless, there are no inflammatory changes noted adjacent to the cecum to suggest the presence of an acute appendicitis at this time. Vascular/Lymphatic: Aortic atherosclerosis, without evidence of aneurysm or dissection in the abdominal or pelvic vasculature. No lymphadenopathy noted in the abdomen or pelvis. Reproductive: Status post total abdominal hysterectomy and bilateral salpingo-oophorectomy. No unexpected soft tissue mass in the low anatomic pelvis to suggest locally recurrent disease. Other: Small ventral hernia containing only omental fat. No significant volume of ascites. No pneumoperitoneum. Musculoskeletal: There are no aggressive appearing lytic or blastic lesions noted in the visualized portions of the skeleton.  IMPRESSION: 1. Interval development of left axillary and subpectoral lymphadenopathy is highly concerning for metastatic disease, however, this would be a highly unusual pattern of metastatic spread for an ovarian primary neoplasm. This is most concerning for potential left-sided breast cancer. Correlation with mammography is strongly recommended. 2. No other signs of definite metastatic disease noted elsewhere in the chest, abdomen or pelvis. 3. Aortic atherosclerosis. 4. Additional incidental findings, similar to prior studies, as above. Electronically Signed   By: Vinnie Langton M.D.   On: 04/02/2019 12:19   MM DIAG BREAST TOMO UNI LEFT  Result Date: 04/03/2019 CLINICAL DATA:  74 year old female with history of ovarian cancer. Recent CT scan of the chest abdomen and pelvis showed enlarged left axillary adenopathy. EXAM: DIGITAL DIAGNOSTIC LEFT MAMMOGRAM WITH CAD AND TOMO ULTRASOUND LEFT BREAST COMPARISON:  Previous exam(s). ACR Breast Density Category c: The breast tissue is heterogeneously dense, which may obscure small masses. FINDINGS: On the MLO view enlarged left axillary adenopathy is identified. No suspicious mass or malignant type microcalcifications identified in the left breast. Mammographic images were processed with CAD. Targeted ultrasound is performed, showing 4 abnormal lymph nodes with cortical thickening in the  left axilla. They measure 3.2 x 1.2 x 1.7 cm, 1.4 x 1.4 x 1.6 cm, 1.6 x 1.5 x 1.8 cm and 7 x 7 x 7 mm. IMPRESSION: Suspicious left axillary adenopathy. RECOMMENDATION: Ultrasound-guided core biopsy of the abnormal left axillary adenopathy is recommended. If the adenopathy is malignancy of a breast origin further evaluation of the breast with MRI would be recommended. I have discussed the findings and recommendations with the patient. If applicable, a reminder letter will be sent to the patient regarding the next appointment. BI-RADS CATEGORY  4: Suspicious. Electronically Signed    By: Lillia Mountain M.D.   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 The differential includes metastatic high grade serous carcinoma. This was found to be concordant by Dr. Marin Olp and likely represents metastatic disease from patient's known ovarian carcinoma. Pathology results were discussed with the patient and Elmo Putt RN Oncology Nurse Navigator by telephone. The patient reported doing well after the biopsy with tenderness at the site. Post biopsy instructions and care were reviewed and questions were answered. The patient was encouraged to call [facility] for any additional concerns. The patient is scheduled for follow up care with Dr. Heath Lark to discuss results and treatment plan on April 09, 2019. Pathology results reported by Stacie Acres RN on 04/04/2019. Electronically Signed   By: Marin Olp M.D.   On: 04/04/2019 14:32   Result Date: 04/04/2019 CLINICAL DATA:  Abnormal left axillary adenopathy. Patient has a history of metastatic ovarian carcinoma with completion of chemotherapy November 2020. Patient is on Xarelto. EXAM: [place] AXILLARY NODE CORE BIOPSY LEFT COMPARISON:  Previous exam(s). PROCEDURE: I met with the patient and we discussed the procedure of ultrasound-guided biopsy, including benefits and alternatives. We discussed the high likelihood of a successful procedure. We discussed the risks of the procedure, including infection, bleeding, tissue injury, clip migration, and inadequate sampling. Informed written consent was given. The usual time-out protocol was performed immediately prior to the procedure. Using sterile technique and 1% Lidocaine as local anesthetic, under direct ultrasound visualization, a 14 gauge spring-loaded device was used to perform biopsy of the targeted abnormal left axillary  lymph node using a inferior to superior approach. Four tissue specimens were obtained as 2 were placed in saline and 2 in formalin. Patient tolerated the procedure well without complications. At the conclusion of the procedure [facility] tissue marker clip was deployed into the biopsy cavity. Follow up mammographic imaging was performed and dictated separately. IMPRESSION: Ultrasound guided biopsy of abnormal left axillary lymph node. No apparent complications. Electronically Signed: By: Marin Olp M.D. On: 04/03/2019 11:39   [place] AXILLA LEFT  Result Date: 04/03/2019 CLINICAL DATA:  74 year old female with history of ovarian cancer. Recent CT scan of the chest abdomen and pelvis showed enlarged left axillary adenopathy. EXAM: DIGITAL DIAGNOSTIC LEFT MAMMOGRAM WITH CAD AND TOMO ULTRASOUND LEFT BREAST COMPARISON:  Previous exam(s). ACR Breast Density Category c: The breast tissue is heterogeneously dense, which may obscure small masses. FINDINGS: On the MLO view enlarged left axillary adenopathy is identified. No suspicious mass or malignant type microcalcifications identified in the left breast. Mammographic images were processed with CAD. Targeted ultrasound is performed, showing 4 abnormal lymph nodes with cortical thickening in the left axilla. They measure 3.2 x 1.2 x 1.7 cm, 1.4 x 1.4 x 1.6  cm, 1.6 x 1.5 x 1.8 cm and 7 x 7 x 7 mm. IMPRESSION: Suspicious left axillary adenopathy. RECOMMENDATION: Ultrasound-guided core biopsy of the abnormal left axillary adenopathy is recommended. If the adenopathy is malignancy of a breast origin further evaluation of the breast with MRI would be recommended. I have discussed the findings and recommendations with the patient. If applicable, a reminder letter will be sent to the patient regarding the next appointment. BI-RADS CATEGORY  4: Suspicious. Electronically Signed   By: Lillia Mountain M.D.   On: 04/03/2019 10:34

## 2019-04-22 NOTE — Assessment & Plan Note (Signed)
The enlarged lymph node is palpable but not worse We will monitor carefully

## 2019-04-22 NOTE — Assessment & Plan Note (Signed)
She tolerated treatment well except for pancytopenia The lymph node is about the same I plan to reduce the dose of her chemotherapy and modify her treatment plan to days 1, 8 and rest day 15, for cycle of every 21 days I recommend minimum 3 cycles of treatment before repeat imaging study

## 2019-04-22 NOTE — Assessment & Plan Note (Signed)
She has mild intermittent abdominal discomfort I recommend over-the-counter analgesics

## 2019-04-22 NOTE — Assessment & Plan Note (Signed)
She has intermittently elevated blood pressure but not symptomatic We will observe closely for now

## 2019-04-22 NOTE — Assessment & Plan Note (Signed)
Planned dose adjustment We will proceed with treatment I also plan to modify her chemotherapy schedule as above

## 2019-04-22 NOTE — Patient Instructions (Signed)
Gemcitabine injection What is this medicine? GEMCITABINE (jem SYE ta been) is a chemotherapy drug. This medicine is used to treat many types of cancer like breast cancer, lung cancer, pancreatic cancer, and ovarian cancer. This medicine may be used for other purposes; ask your health care provider or pharmacist if you have questions. COMMON BRAND NAME(S): Gemzar, Infugem What should I tell my health care provider before I take this medicine? They need to know if you have any of these conditions:  blood disorders  infection  kidney disease  liver disease  lung or breathing disease, like asthma  recent or ongoing radiation therapy  an unusual or allergic reaction to gemcitabine, other chemotherapy, other medicines, foods, dyes, or preservatives  pregnant or trying to get pregnant  breast-feeding How should I use this medicine? This drug is given as an infusion into a vein. It is administered in a hospital or clinic by a specially trained health care professional. Talk to your pediatrician regarding the use of this medicine in children. Special care may be needed. Overdosage: If you think you have taken too much of this medicine contact a poison control center or emergency room at once. NOTE: This medicine is only for you. Do not share this medicine with others. What if I miss a dose? It is important not to miss your dose. Call your doctor or health care professional if you are unable to keep an appointment. What may interact with this medicine?  medicines to increase blood counts like filgrastim, pegfilgrastim, sargramostim  some other chemotherapy drugs like cisplatin  vaccines Talk to your doctor or health care professional before taking any of these medicines:  acetaminophen  aspirin  ibuprofen  ketoprofen  naproxen This list may not describe all possible interactions. Give your health care provider a list of all the medicines, herbs, non-prescription drugs, or  dietary supplements you use. Also tell them if you smoke, drink alcohol, or use illegal drugs. Some items may interact with your medicine. What should I watch for while using this medicine? Visit your doctor for checks on your progress. This drug may make you feel generally unwell. This is not uncommon, as chemotherapy can affect healthy cells as well as cancer cells. Report any side effects. Continue your course of treatment even though you feel ill unless your doctor tells you to stop. In some cases, you may be given additional medicines to help with side effects. Follow all directions for their use. Call your doctor or health care professional for advice if you get a fever, chills or sore throat, or other symptoms of a cold or flu. Do not treat yourself. This drug decreases your body's ability to fight infections. Try to avoid being around people who are sick. This medicine may increase your risk to bruise or bleed. Call your doctor or health care professional if you notice any unusual bleeding. Be careful brushing and flossing your teeth or using a toothpick because you may get an infection or bleed more easily. If you have any dental work done, tell your dentist you are receiving this medicine. Avoid taking products that contain aspirin, acetaminophen, ibuprofen, naproxen, or ketoprofen unless instructed by your doctor. These medicines may hide a fever. Do not become pregnant while taking this medicine or for 6 months after stopping it. Women should inform their doctor if they wish to become pregnant or think they might be pregnant. Men should not father a child while taking this medicine and for 3 months after stopping it.   There is a potential for serious side effects to an unborn child. Talk to your health care professional or pharmacist for more information. Do not breast-feed an infant while taking this medicine or for at least 1 week after stopping it. Men should inform their doctors if they wish  to father a child. This medicine may lower sperm counts. Talk with your doctor or health care professional if you are concerned about your fertility. What side effects may I notice from receiving this medicine? Side effects that you should report to your doctor or health care professional as soon as possible:  allergic reactions like skin rash, itching or hives, swelling of the face, lips, or tongue  breathing problems  pain, redness, or irritation at site where injected  signs and symptoms of a dangerous change in heartbeat or heart rhythm like chest pain; dizziness; fast or irregular heartbeat; palpitations; feeling faint or lightheaded, falls; breathing problems  signs of decreased platelets or bleeding - bruising, pinpoint red spots on the skin, black, tarry stools, blood in the urine  signs of decreased red blood cells - unusually weak or tired, feeling faint or lightheaded, falls  signs of infection - fever or chills, cough, sore throat, pain or difficulty passing urine  signs and symptoms of kidney injury like trouble passing urine or change in the amount of urine  signs and symptoms of liver injury like dark yellow or brown urine; general ill feeling or flu-like symptoms; light-colored stools; loss of appetite; nausea; right upper belly pain; unusually weak or tired; yellowing of the eyes or skin  swelling of ankles, feet, hands Side effects that usually do not require medical attention (report to your doctor or health care professional if they continue or are bothersome):  constipation  diarrhea  hair loss  loss of appetite  nausea  rash  vomiting This list may not describe all possible side effects. Call your doctor for medical advice about side effects. You may report side effects to FDA at 1-800-FDA-1088. Where should I keep my medicine? This drug is given in a hospital or clinic and will not be stored at home. NOTE: This sheet is a summary. It may not cover all  possible information. If you have questions about this medicine, talk to your doctor, pharmacist, or health care provider.  2020 [employer] (2017-03-22 18:06:11)  

## 2019-04-23 ENCOUNTER — Telehealth: Payer: Self-pay | Admitting: Hematology and Oncology

## 2019-04-23 ENCOUNTER — Other Ambulatory Visit: Payer: PPO

## 2019-04-23 NOTE — Telephone Encounter (Signed)
Scheduled appts per 4/12 sch msg. Left voicemail with next appt dates and times.

## 2019-04-26 ENCOUNTER — Ambulatory Visit: Payer: PPO | Admitting: Cardiovascular Disease

## 2019-04-26 ENCOUNTER — Encounter: Payer: Self-pay | Admitting: Cardiovascular Disease

## 2019-04-26 ENCOUNTER — Other Ambulatory Visit: Payer: Self-pay

## 2019-04-26 DIAGNOSIS — G4733 Obstructive sleep apnea (adult) (pediatric): Secondary | ICD-10-CM

## 2019-04-26 DIAGNOSIS — I480 Paroxysmal atrial fibrillation: Secondary | ICD-10-CM | POA: Diagnosis not present

## 2019-04-26 DIAGNOSIS — Z7901 Long term (current) use of anticoagulants: Secondary | ICD-10-CM

## 2019-04-26 DIAGNOSIS — E039 Hypothyroidism, unspecified: Secondary | ICD-10-CM | POA: Diagnosis not present

## 2019-04-26 DIAGNOSIS — I10 Essential (primary) hypertension: Secondary | ICD-10-CM | POA: Diagnosis not present

## 2019-04-26 DIAGNOSIS — C561 Malignant neoplasm of right ovary: Secondary | ICD-10-CM

## 2019-04-26 NOTE — Progress Notes (Signed)
Cardiology Office Note    Date:  04/28/2019   ID:  [place] 09-26-1945, MRN [id number]  PCP:  [place], Aariyana G, MD  Cardiologist:  Shelva Majestic, MD    History of Present Illness:  Haley [name] is a 74 y.o. female who resents for 49-monthcardiology follow-up evaluation.    [name] a history of palpitations, paroxysmal atrial fibrillation, hyperlipidemia, GERD, as well as hypothyroidism. She also has a history of sleep apnea, currently untreated. Since I last saw her, she unfortunately was diagnosed with metastatic ovarian CA and has undergone extensive GYN and oncological evaluation.A preoperative chest CT did not show active cardiopulmonary diseaseand there was no evidence for large central pulmonary embolus. She was noted to have aortic atherosclerosis without aneurysm or dissection.On March 27, 2018 she underwent an exploratory laparotomy with bilateral salpingo-oophorectomy, omentectomy, radical tumor debulking for ovariancancer by Dr. RDenman GeorgeShe is followed by Dr. GCandida Peelingso far has had 2 cycles of chemotherapy. She admits to being fatigued and weak following the treatments but ultimately improves.Prior to her exploratory laparotomy surgery, she underwent a myocardial perfusion study which essentially was normal and did not show evidence for scar or ischemia. Ejection fraction was 57%. She was given operative clearance.  She was evaluated by me in a telemedicine visit on April 24, 2018.  At that time she was feeling fairly well and deniedany episodes of chest tightness or pressure. She does admit to some fatigue. She is unaware of any significant recent palpitations. She states her blood pressure typically was running in the 130/70 range. Her pulse typically is in the 60s to 70s. She denied presyncope or syncope or significant swelling. She admits to some abdominal discomfort. She also has a history of recent neuropathy.  I last saw her in a  telemedicine evaluation in August 2020.  She has been monitored by Dr. GAlvy Bimlerfor her right ovarian epithelial cancer. She developed small petechial hemorrhages at the anterior shin area which were felt likely secondary to bevacizumab.  In addition, her blood pressure had begun to increase in hydrochlorothiazide was added to her medical regimen of amlodipine and metoprolol.  She does experience rare palpitations.  She was sleeping well but at times notes some palpitations in the early morning.  Remotely, she was diagnosed with sleep apnea and apparently did not tolerate CPAP therapy.  She denied any chest tightness.  She admitted to increased stress and at times this resulted in significant blood pressure lability.  She has been undergoing treatment by Dr. GAlvy Bimler  Apparently, the patient had self discontinued amlodipine.  An echo Doppler study of October 16, 2018 showed an EF of 60 to 65%.  The average left and a global longitudinal strain was -22.4.  Atrial dimensions were normal.  There was trace MR and mild TR.  There was mild aortic sclerosis without stenosis.  Estimated RV systolic pressure was 267.6mmHg.    I last saw her in November 2020.  She had been seen by Dr. GAlvy Bimler2 days previously and due to a serum sodium of 128 it was advised that she hold HCTZ as well as spironolactone.  She stopped taking spironolactone, but was unaware that she should hold HCTZ.  When I saw her, her blood pressure was elevated and I recommended resumption of a low-dose of amlodipine 5 mg for which she had tolerated in the past and  recommended to hold the HCTZ.  Over the past several months she has felt well.  She has continued to be followed closely by Dr. Alvy Bimler.  She has been tolerating treatment of her right ovarian epithelial cancer with the exception of pancytopenia development.  Dr. Alvy Bimler has reduced the dose of her chemotherapy and has modified her treatment plan.  Ms. Tatum denies any chest pain or  palpitations.  She feels well.  She presents for follow-up cardiology evaluation.  Past Medical History:  Diagnosis Date  . Allergy    SEASONAL  . Anxiety   . Cataract    BILATERAL  . Dysrhythmia   . Family history of lung cancer   . Family history of prostate cancer   . Family history of prostate cancer   . Family history of uterine cancer   . Fibromyalgia   . GERD (gastroesophageal reflux disease)   . H/O echocardiogram 04/28/08   EF>55% trace mitral regurgitation, No significant valvular pathology  . Hemorrhoids    internal  . History of stress test 02/08/2011   Normal Myocardial perfusion study, this is a low risk scan, No prior study available for comparison  . Hyperlipidemia   . Hypertension    hx of  . Hypothyroidism   . MVP (mitral valve prolapse)    mild and MR  . OA (osteoarthritis)   . Osteoporosis   . ovarian ca dx'd 12/2017   ovarian cancer  . Paroxysmal A-fib ([place])   . Sleep apnea    does not use prescribed CPAP  does not tolerate  . Thyroid disease    HYPO  . Varicosities of leg   . Vitamin D deficiency     Past Surgical History:  Procedure Laterality Date  . ABDOMINAL HYSTERECTOMY  1989  . APPENDECTOMY  1989   with hysterectomy  . BILATERAL SALPINGECTOMY N/A 03/27/2018   Procedure: EXPLORATORY LAPARATOMY, BILATERAL SALPINGOOPHERECTOMY;  Surgeon: Everitt Amber, MD;  Location: [facility];  Service: Gynecology;  Laterality: N/A;  . BREAST EXCISIONAL BIOPSY Left 1995   Benign  . COLONOSCOPY  11-20-2000  . DEBULKING N/A 03/27/2018   Procedure: RADICAL TUMOR DEBULKING;  Surgeon: Everitt Amber, MD;  Location: [facility];  Service: Gynecology;  Laterality: N/A;  . IR IMAGING GUIDED PORT INSERTION  01/17/2018  . NOSE SURGERY  1990  . OMENTECTOMY N/A 03/27/2018   Procedure: OMENTECTOMY;  Surgeon: Everitt Amber, MD;  Location: [facility];  Service: Gynecology;  Laterality: N/A;    Current Medications: Outpatient Medications Prior to Visit  Medication Sig Dispense Refill  .  acetaminophen (TYLENOL) 500 MG tablet Take 2 tablets (1,000 mg total) by mouth every 6 (six) hours. (Patient taking differently: Take 1,000 mg by mouth every 6 (six) hours as needed. ) 30 tablet 0  . ALPRAZolam (XANAX) 0.5 MG tablet TAKE 1/2 TO 1 (ONE-HALF TO ONE) TABLET BY MOUTH ONCE DAILY AS NEEDED FOR ANXIETY 30 tablet 2  . alum & mag hydroxide-simeth (MAALOX/MYLANTA) 200-200-20 MG/5ML suspension Take 30 mLs by mouth every 6 (six) hours as needed for indigestion or heartburn. 355 mL 0  . Artificial Saliva (SALIVASURE) LOZG Use as directed 1 lozenge in the mouth or throat every 4 (four) hours as needed. 90 lozenge 0  . Calcium Carbonate (CALTRATE 600) 1500 MG TABS Take 600 mg of elemental calcium by mouth daily.     . cholecalciferol (VITAMIN D) 1000 UNITS tablet Take 1,000 Units by mouth daily.     Marland Kitchen estradiol (ESTRACE) 0.1 MG/GM vaginal cream Place 1 Applicatorful vaginally 3 (three) times a week. 42.5 g 12  . fluticasone (FLONASE)  50 MCG/ACT nasal spray Place 2 sprays into both nostrils daily. 48 g 2  . gabapentin (NEURONTIN) 300 MG capsule Take 1 capsule (300 mg total) by mouth 2 (two) times daily. 60 capsule 11  . HYDROmorphone (DILAUDID) 2 MG tablet Take 1 tablet (2 mg total) by mouth every 4 (four) hours as needed for severe pain. 30 tablet 0  . lactose free nutrition (BOOST PLUS) LIQD Take 237 mLs by mouth daily. (Patient taking differently: Take 237 mLs by mouth 2 (two) times daily between meals. ) 10 Can 0  . levothyroxine (SYNTHROID) 75 MCG tablet Take 1 tablet (75 mcg total) by mouth daily. 90 tablet 3  . lidocaine-prilocaine (EMLA) cream Apply to affected area once 30 g 3  . Magnesium 400 MG TABS Take 250 mg by mouth daily.    . Menthol-Methyl Salicylate (MUSCLE RUB) 10-15 % CREA Apply 1 application topically as needed for muscle pain.    . metoprolol succinate (TOPROL-XL) 50 MG 24 hr tablet Take 1 tablet (50 mg total) by mouth daily. Take with or immediately following a meal. 90 tablet  2  . Multiple Vitamins-Minerals (MULTIVITAMIN WITH MINERALS) tablet Take 1 tablet by mouth daily.      Marland Kitchen nystatin (MYCOSTATIN) 100000 UNIT/ML suspension Take 5 mLs (500,000 Units total) by mouth 4 (four) times daily. 473 mL 0  . Omega-3 Fatty Acids (FISH OIL) 1000 MG CAPS Take 2,000 mg by mouth 2 (two) times daily.     Marland Kitchen omeprazole (PRILOSEC) 40 MG capsule Take 1 capsule (40 mg total) by mouth daily. 90 capsule 3  . ondansetron (ZOFRAN) 8 MG tablet Take 1 tablet (8 mg total) by mouth every 8 (eight) hours as needed (Nausea or vomiting). 30 tablet 1  . polyethylene glycol (MIRALAX / GLYCOLAX) packet Take 17 g by mouth daily. 14 each 0  . prochlorperazine (COMPAZINE) 10 MG tablet Take 1 tablet (10 mg total) by mouth every 6 (six) hours as needed (Nausea or vomiting). 30 tablet 1  . senna (SENOKOT) 8.6 MG TABS tablet Take 1 tablet (8.6 mg total) by mouth at bedtime as needed for mild constipation. 120 each 0  . simvastatin (ZOCOR) 20 MG tablet Take 1 tablet (20 mg total) by mouth at bedtime. 90 tablet 1  . vitamin C (ASCORBIC ACID) 500 MG tablet Take 1,000 mg by mouth daily.    [name] 20 MG TABS tablet TAKE 1 TABLET BY MOUTH ONCE DAILY WITH  SUPPER 90 tablet 1  . ibuprofen (ADVIL,MOTRIN) 200 MG tablet Take 600 mg by mouth every 6 (six) hours as needed for moderate pain.      No facility-administered medications prior to visit.     Allergies:   Tramadol hcl   Social History   Socioeconomic History  . Marital status: Married    Spouse name: Not on file  . Number of children: Not on file  . Years of education: Not on file  . Highest education level: Not on file  Occupational History  . Not on file  Tobacco Use  . Smoking status: Light Tobacco Smoker    Years: 10.00    Types: Cigars  . Smokeless tobacco: Never Used  . Tobacco comment: not daily  [place] cigars 1-2   Substance and Sexual Activity  . Alcohol use: No    Alcohol/week: 0.0 standard drinks  . Drug use: No  . Sexual  activity: Yes    Comment: 1st intercourse 17yo-1 partner  Other Topics Concern  . Not  on file  Social History Narrative  . Not on file   Social Determinants of Health   Financial Resource Strain:   . Difficulty of Paying Living Expenses:   Food Insecurity:   . Worried About Charity fundraiser in the Last Year:   . Arboriculturist in the Last Year:   Transportation Needs:   . Film/video editor (Medical):   Marland Kitchen Lack of Transportation (Non-Medical):   Physical Activity:   . Days of Exercise per Week:   . Minutes of Exercise per Session:   Stress:   . Feeling of Stress :   Social Connections:   . Frequency of Communication with Friends and Family:   . Frequency of Social Gatherings with Friends and Family:   . Attends Religious Services:   . Active Member of Clubs or Organizations:   . Attends Archivist Meetings:   Marland Kitchen Marital Status:      Family History:  The patient's family history includes Cancer in her paternal aunt; Diabetes in her daughter and mother; Heart disease in her mother; Hypertension in her mother; Lung cancer in her niece; Prostate cancer in her paternal uncle; Stroke in her maternal grandmother; Uterine cancer in her cousin.   ROS General: Negative; No fevers, chills, or night sweats;  HEENT: Negative; No changes in vision or hearing, sinus congestion, difficulty swallowing Pulmonary: Negative; No cough, wheezing, shortness of breath, hemoptysis Cardiovascular: Negative; No chest pain, presyncope, syncope, palpitations GI: Negative; No nausea, vomiting, diarrhea, or abdominal pain GU: Negative; No dysuria, hematuria, or difficulty voiding Musculoskeletal: Negative; no myalgias, joint pain, or weakness Hematologic/Oncology: Right ovarian epithelial cancer, axillary lymphadenopathy, pancytopenia Endocrine: Negative; no heat/cold intolerance; no diabetes Neuro: Negative; no changes in balance, headaches Skin: Negative; No rashes or skin  lesions Psychiatric: Negative; No behavioral problems, depression Sleep: Negative; No snoring, daytime sleepiness, hypersomnolence, bruxism, restless legs, hypnogognic hallucinations, no cataplexy Other comprehensive 14 point system review is negative.   PHYSICAL EXAM:   VS:  BP 138/78   Pulse (!) 56   Ht '5\' 2"'$  (1.575 m)   Wt 150 lb 3.2 oz (68.1 kg)   BMI 27.47 kg/m     Repeat blood pressure by me was 122/74  Wt Readings from Last 3 Encounters:  04/26/19 150 lb 3.2 oz (68.1 kg)  04/22/19 146 lb 12.8 oz (66.6 kg)  04/09/19 147 lb 6.4 oz (66.9 kg)    General: Alert, oriented, no distress.  Skin: normal turgor, no rashes, warm and dry HEENT: Normocephalic, atraumatic. Pupils equal round and reactive to light; sclera anicteric; extraocular muscles intact; Nose without nasal septal hypertrophy Mouth/Parynx benign; Mallinpatti scale 3 Neck: No JVD, no carotid bruits; normal carotid upstroke Lungs: clear to ausculatation and percussion; no wheezing or rales Chest wall: Right Port-A-Cath anterior chest;   axillary lymphadenopathy Heart: PMI not displaced, RRR, s1 s2 normal, 1/6 systolic murmur, no diastolic murmur, no rubs, gallops, thrills, or heaves Abdomen: soft, nontender; no hepatosplenomehaly, BS+; abdominal aorta nontender and not dilated by palpation. Back: no CVA tenderness Pulses 2+ Musculoskeletal: full range of motion, normal strength, no joint deformities Extremities: no clubbing cyanosis or edema, Homan's sign negative  Neurologic: grossly nonfocal; Cranial nerves grossly wnl Psychologic: Normal mood and affect   Studies/Labs Reviewed:   ECG (independently read by me): Sinus bradycardia at 56 bpm.  QS V1 V2.  No ectopy.  Normal intervals.  November 2020 ECG (independently read by me): Sinus bradycardia at 49 bpm.  Left axis  deviation.  QS complex V1 V2.  March 08, 2018 ECG (independently read by me): Sinus bradycardia 59 bpm, QS complex anteroseptally  Recent  Labs: BMP Latest Ref Rng & Units 04/22/2019 04/15/2019 03/26/2019  Glucose 70 - 99 mg/dL 86 87 100(H)  BUN 8 - 23 mg/dL 7(L) 15 11  Creatinine 0.44 - 1.00 mg/dL 0.73 0.81 0.76  Sodium 135 - 145 mmol/L 142 142 141  Potassium 3.5 - 5.1 mmol/L 3.7 3.8 3.6  Chloride 98 - 111 mmol/L 108 106 106  CO2 22 - 32 mmol/L '26 25 26  '$ Calcium 8.9 - 10.3 mg/dL 8.9 9.5 8.7(L)     Hepatic Function Latest Ref Rng & Units 04/22/2019 04/15/2019 03/26/2019  Total Protein 6.5 - 8.1 g/dL 6.8 6.7 6.5  Albumin 3.5 - 5.0 g/dL 3.2(L) 3.4(L) 3.3(L)  AST 15 - 41 U/L 14(L) 12(L) 14(L)  ALT 0 - 44 U/L '12 12 11  '$ Alk Phosphatase 38 - 126 U/L 78 74 78  Total Bilirubin 0.3 - 1.2 mg/dL 0.3 0.3 0.3  Bilirubin, Direct 0.0 - 0.3 mg/dL - - -    CBC Latest Ref Rng & Units 04/22/2019 04/15/2019 03/26/2019  WBC 4.0 - 10.5 K/uL 3.9(L) 7.4 7.0  Hemoglobin 12.0 - 15.0 g/dL 11.0(L) 12.2 12.0  Hematocrit 36.0 - 46.0 % 34.0(L) 38.4 37.4  Platelets 150 - 400 K/uL 95(L) 166 170   Lab Results  Component Value Date   MCV 93.2 04/22/2019   MCV 96.2 04/15/2019   MCV 96.4 03/26/2019   Lab Results  Component Value Date   TSH 2.511 12/13/2018   Lab Results  Component Value Date   HGBA1C 6.0 08/06/2012     BNP No results found for: BNP  ProBNP No results found for: PROBNP   Lipid Panel     Component Value Date/Time   CHOL 157 11/03/2017 1019   TRIG 154.0 (H) 11/03/2017 1019   HDL 56.00 11/03/2017 1019   CHOLHDL 3 11/03/2017 1019   VLDL 30.8 11/03/2017 1019   LDLCALC 70 11/03/2017 1019   LDLDIRECT 84.3 12/16/2010 0921     RADIOLOGY: CT CHEST W CONTRAST  Result Date: 04/02/2019 CLINICAL DATA:  74 year old female with history of ovarian cancer. Staging examination. Palpable left axillary lymph node enlargement. EXAM: CT CHEST, ABDOMEN, AND PELVIS WITH CONTRAST TECHNIQUE: Multidetector CT imaging of the chest, abdomen and pelvis was performed following the standard protocol during bolus administration of intravenous  contrast. CONTRAST:  38m OMNIPAQUE IOHEXOL 300 MG/ML  SOLN COMPARISON:  CT the abdomen and pelvis 12/11/2018. CT the chest, abdomen and pelvis 01/15/2018. FINDINGS: CT CHEST FINDINGS Cardiovascular: Heart size is normal. There is no significant pericardial fluid, thickening or pericardial calcification. Aortic atherosclerosis. No definite coronary artery calcifications. Right internal jugular single-lumen porta cath with tip terminating in the distal superior vena cava. Mediastinum/Nodes: Borderline enlarged anterior mediastinal lymph node adjacent to the right atrioventricular groove (axial image 48 of series 3) currently measuring 1.1 cm in short axis, similar to recent prior examinations. No other pathologically enlarged mediastinal or hilar lymph nodes are noted. Esophagus is unremarkable in appearance. Numerous left axillary lymph nodes and left subpectoral lymph nodes appear increased in size compared to the prior examination. The largest left axillary lymph nodes measure up to 1.5 cm in short axis, while the largest subpectoral lymph node measures up to 1.1 cm in short axis. Lungs/Pleura: No suspicious pulmonary nodules or masses are noted. No acute consolidative airspace disease. No pleural effusions. Musculoskeletal: There are no aggressive appearing  lytic or blastic lesions noted in the visualized portions of the skeleton. CT ABDOMEN PELVIS FINDINGS Hepatobiliary: 1 cm low-attenuation lesion in segment 2 of the liver, similar to the prior study, compatible with a simple cyst. No other suspicious hepatic lesions. No intra or extrahepatic biliary ductal dilatation. Gallbladder is normal in appearance. Pancreas: No pancreatic mass. No pancreatic ductal dilatation. No pancreatic or peripancreatic fluid collections or inflammatory changes. Spleen: Unremarkable. Adrenals/Urinary Tract: Bilateral kidneys and bilateral adrenal glands are normal in appearance. No hydroureteronephrosis. Urinary bladder is normal in  appearance. Stomach/Bowel: Normal appearance of the stomach. No pathologic dilatation of small bowel or colon. The appendix is not confidently identified and may be surgically absent. Regardless, there are no inflammatory changes noted adjacent to the cecum to suggest the presence of an acute appendicitis at this time. Vascular/Lymphatic: Aortic atherosclerosis, without evidence of aneurysm or dissection in the abdominal or pelvic vasculature. No lymphadenopathy noted in the abdomen or pelvis. Reproductive: Status post total abdominal hysterectomy and bilateral salpingo-oophorectomy. No unexpected soft tissue mass in the low anatomic pelvis to suggest locally recurrent disease. Other: Small ventral hernia containing only omental fat. No significant volume of ascites. No pneumoperitoneum. Musculoskeletal: There are no aggressive appearing lytic or blastic lesions noted in the visualized portions of the skeleton. IMPRESSION: 1. Interval development of left axillary and subpectoral lymphadenopathy is highly concerning for metastatic disease, however, this would be a highly unusual pattern of metastatic spread for an ovarian primary neoplasm. This is most concerning for potential left-sided breast cancer. Correlation with mammography is strongly recommended. 2. No other signs of definite metastatic disease noted elsewhere in the chest, abdomen or pelvis. 3. Aortic atherosclerosis. 4. Additional incidental findings, similar to prior studies, as above. Electronically Signed   By: Vinnie Langton M.D.   On: 04/02/2019 12:19   CT ABDOMEN PELVIS W CONTRAST  Result Date: 04/02/2019 CLINICAL DATA:  74 year old female with history of ovarian cancer. Staging examination. Palpable left axillary lymph node enlargement. EXAM: CT CHEST, ABDOMEN, AND PELVIS WITH CONTRAST TECHNIQUE: Multidetector CT imaging of the chest, abdomen and pelvis was performed following the standard protocol during bolus administration of intravenous  contrast. CONTRAST:  23m OMNIPAQUE IOHEXOL 300 MG/ML  SOLN COMPARISON:  CT the abdomen and pelvis 12/11/2018. CT the chest, abdomen and pelvis 01/15/2018. FINDINGS: CT CHEST FINDINGS Cardiovascular: Heart size is normal. There is no significant pericardial fluid, thickening or pericardial calcification. Aortic atherosclerosis. No definite coronary artery calcifications. Right internal jugular single-lumen porta cath with tip terminating in the distal superior vena cava. Mediastinum/Nodes: Borderline enlarged anterior mediastinal lymph node adjacent to the right atrioventricular groove (axial image 48 of series 3) currently measuring 1.1 cm in short axis, similar to recent prior examinations. No other pathologically enlarged mediastinal or hilar lymph nodes are noted. Esophagus is unremarkable in appearance. Numerous left axillary lymph nodes and left subpectoral lymph nodes appear increased in size compared to the prior examination. The largest left axillary lymph nodes measure up to 1.5 cm in short axis, while the largest subpectoral lymph node measures up to 1.1 cm in short axis. Lungs/Pleura: No suspicious pulmonary nodules or masses are noted. No acute consolidative airspace disease. No pleural effusions. Musculoskeletal: There are no aggressive appearing lytic or blastic lesions noted in the visualized portions of the skeleton. CT ABDOMEN PELVIS FINDINGS Hepatobiliary: 1 cm low-attenuation lesion in segment 2 of the liver, similar to the prior study, compatible with a simple cyst. No other suspicious hepatic lesions. No intra or extrahepatic biliary  ductal dilatation. Gallbladder is normal in appearance. Pancreas: No pancreatic mass. No pancreatic ductal dilatation. No pancreatic or peripancreatic fluid collections or inflammatory changes. Spleen: Unremarkable. Adrenals/Urinary Tract: Bilateral kidneys and bilateral adrenal glands are normal in appearance. No hydroureteronephrosis. Urinary bladder is normal in  appearance. Stomach/Bowel: Normal appearance of the stomach. No pathologic dilatation of small bowel or colon. The appendix is not confidently identified and may be surgically absent. Regardless, there are no inflammatory changes noted adjacent to the cecum to suggest the presence of an acute appendicitis at this time. Vascular/Lymphatic: Aortic atherosclerosis, without evidence of aneurysm or dissection in the abdominal or pelvic vasculature. No lymphadenopathy noted in the abdomen or pelvis. Reproductive: Status post total abdominal hysterectomy and bilateral salpingo-oophorectomy. No unexpected soft tissue mass in the low anatomic pelvis to suggest locally recurrent disease. Other: Small ventral hernia containing only omental fat. No significant volume of ascites. No pneumoperitoneum. Musculoskeletal: There are no aggressive appearing lytic or blastic lesions noted in the visualized portions of the skeleton. IMPRESSION: 1. Interval development of left axillary and subpectoral lymphadenopathy is highly concerning for metastatic disease, however, this would be a highly unusual pattern of metastatic spread for an ovarian primary neoplasm. This is most concerning for potential left-sided breast cancer. Correlation with mammography is strongly recommended. 2. No other signs of definite metastatic disease noted elsewhere in the chest, abdomen or pelvis. 3. Aortic atherosclerosis. 4. Additional incidental findings, similar to prior studies, as above. Electronically Signed   By: Vinnie Langton M.D.   On: 04/02/2019 12:19   MM DIAG BREAST TOMO UNI LEFT  Result Date: 04/03/2019 CLINICAL DATA:  74 year old female with history of ovarian cancer. Recent CT scan of the chest abdomen and pelvis showed enlarged left axillary adenopathy. EXAM: DIGITAL DIAGNOSTIC LEFT MAMMOGRAM WITH CAD AND TOMO ULTRASOUND LEFT BREAST COMPARISON:  Previous exam(s). ACR Breast Density Category c: The breast tissue is heterogeneously dense,  which may obscure small masses. FINDINGS: On the MLO view enlarged left axillary adenopathy is identified. No suspicious mass or malignant type microcalcifications identified in the left breast. Mammographic images were processed with CAD. Targeted ultrasound is performed, showing 4 abnormal lymph nodes with cortical thickening in the left axilla. They measure 3.2 x 1.2 x 1.7 cm, 1.4 x 1.4 x 1.6 cm, 1.6 x 1.5 x 1.8 cm and 7 x 7 x 7 mm. IMPRESSION: Suspicious left axillary adenopathy. RECOMMENDATION: Ultrasound-guided core biopsy of the abnormal left axillary adenopathy is recommended. If the adenopathy is malignancy of a breast origin further evaluation of the breast with MRI would be recommended. I have discussed the findings and recommendations with the patient. If applicable, a reminder letter will be sent to the patient regarding the next appointment. BI-RADS CATEGORY  4: Suspicious. Electronically Signed   By: Lillia Mountain M.D.   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 The differential includes metastatic high grade serous carcinoma. This was found to be concordant by Dr. Marin Olp and likely represents metastatic disease from patient's known ovarian carcinoma. Pathology results were discussed with the patient and Elmo Putt RN Oncology Nurse Navigator by telephone. The patient reported doing well after the biopsy with tenderness at the site. Post biopsy instructions and care were reviewed and questions were answered. The patient was encouraged to call [facility] for any additional concerns. The patient  is scheduled for follow up care with Dr. Heath Lark to discuss results and treatment plan on April 09, 2019. Pathology results reported by Stacie Acres RN on 04/04/2019. Electronically Signed   By: Marin Olp M.D.    On: 04/04/2019 14:32   Result Date: 04/04/2019 CLINICAL DATA:  Abnormal left axillary adenopathy. Patient has a history of metastatic ovarian carcinoma with completion of chemotherapy November 2020. Patient is on Xarelto. EXAM: [place] AXILLARY NODE CORE BIOPSY LEFT COMPARISON:  Previous exam(s). PROCEDURE: I met with the patient and we discussed the procedure of ultrasound-guided biopsy, including benefits and alternatives. We discussed the high likelihood of a successful procedure. We discussed the risks of the procedure, including infection, bleeding, tissue injury, clip migration, and inadequate sampling. Informed written consent was given. The usual time-out protocol was performed immediately prior to the procedure. Using sterile technique and 1% Lidocaine as local anesthetic, under direct ultrasound visualization, a 14 gauge spring-loaded device was used to perform biopsy of the targeted abnormal left axillary lymph node using a inferior to superior approach. Four tissue specimens were obtained as 2 were placed in saline and 2 in formalin. Patient tolerated the procedure well without complications. At the conclusion of the procedure [facility] tissue marker clip was deployed into the biopsy cavity. Follow up mammographic imaging was performed and dictated separately. IMPRESSION: Ultrasound guided biopsy of abnormal left axillary lymph node. No apparent complications. Electronically Signed: By: Marin Olp M.D. On: 04/03/2019 11:39   [place] AXILLA LEFT  Result Date: 04/03/2019 CLINICAL DATA:  74 year old female with history of ovarian cancer. Recent CT scan of the chest abdomen and pelvis showed enlarged left axillary adenopathy. EXAM: DIGITAL DIAGNOSTIC LEFT MAMMOGRAM WITH CAD AND TOMO ULTRASOUND LEFT BREAST COMPARISON:  Previous exam(s). ACR Breast Density Category c: The breast tissue is heterogeneously dense, which may obscure small masses. FINDINGS: On the MLO view enlarged left axillary adenopathy is  identified. No suspicious mass or malignant type microcalcifications identified in the left breast. Mammographic images were processed with CAD. Targeted ultrasound is performed, showing 4 abnormal lymph nodes with cortical thickening in the left axilla. They measure 3.2 x 1.2 x 1.7 cm, 1.4 x 1.4 x 1.6 cm, 1.6 x 1.5 x 1.8 cm and 7 x 7 x 7 mm. IMPRESSION: Suspicious left axillary adenopathy. RECOMMENDATION: Ultrasound-guided core biopsy of the abnormal left axillary adenopathy is recommended. If the adenopathy is malignancy of a breast origin further evaluation of the breast with MRI would be recommended. I have discussed the findings and recommendations with the patient. If applicable, a reminder letter will be sent to the patient regarding the next appointment. BI-RADS CATEGORY  4: Suspicious. Electronically Signed   By: Lillia Mountain M.D.   On: 04/03/2019 10:34     Additional studies/ records that were reviewed today include:  07/11/2018 Echocardiogram   1. The left ventricle has normal systolic function with an ejection fraction of 60-65%. The cavity size was normal. Left ventricular diastolic parameters were normal. 2. Normal GLS -20.1. 3. The right ventricle has normal systolic function. The cavity was normal. There is no increase in right ventricular wall thickness. 4. The mitral valve is degenerative. Moderate thickening of the mitral valve leaflet. Moderate calcification of the mitral valve leaflet. 5. The aortic valve is tricuspid. Moderate thickening of the aortic valve. Moderate calcification of the aortic valve. Aortic valve regurgitation is trivial by color flow Doppler. 6. The aortic root is normal in size and structure.     ECHO 10/16/2018  IMPRESSIONS  1. No significant change from prior study (07/11/2018).  2. Left ventricular ejection fraction, by visual estimation, is 60 to 65%. The left ventricle has normal function. Normal left ventricular size. There is no left ventricular  hypertrophy.  3. LVEF by 3D assessment 63%.  4. The average left ventricular global longitudinal strain is -22.4 %.  5. Global right ventricle has normal systolic function.The right ventricular size is normal. No increase in right ventricular wall thickness.  6. Left atrial size was normal.  7. Right atrial size was normal.  8. Presence of pericardial fat pad.  9. Moderate thickening of the mitral valve leaflet(s). 10. The mitral valve is normal in structure. Trace mitral valve regurgitation. 11. The tricuspid valve is normal in structure. Tricuspid valve regurgitation is mild. 12. Aortic regurgitation PHT measures 467 msec. 13. The aortic valve is tricuspid Aortic valve regurgitation is mild by color flow Doppler. Mild aortic valve sclerosis without stenosis. 14. There is mild to moderate calcification of the AoV, with focal calcification of the [place]. The aortic regurgitation is mild in severity, likely related to degenerative valve disease. 15. The pulmonic valve was grossly normal. Pulmonic valve regurgitation is not visualized by color flow Doppler. 16. Mild plaque invoving the ascending aorta. 17. Normal pulmonary artery systolic pressure. 18. The tricuspid regurgitant velocity is 2.52 m/s, and with an assumed right atrial pressure of 3 mmHg, the estimated right ventricular systolic pressure is normal at 28.4 mmHg.   ASSESSMENT:    1. Essential hypertension   2. PAF (paroxysmal atrial fibrillation) ([place])   3. Chronic anticoagulation   4. Right ovarian epithelial cancer ([place])   5. OSA (obstructive sleep apnea)   6. Hypothyroidism, unspecified type      PLAN:  1.  Essential hypertension : blood pressure today is stable and she is on Toprol-XL 50 mg daily.  I had last seen her I had initiated amlodipine 5 mg but I am not certain if she is on this but it is not just on her med list.  She is no longer on hydrochlorothiazide or spironolactone.  2.  Hyponatremia: This has resolved.   Most recent potassium 3.7 on April 22, 2019.    3.  Palpitations: Resolved with Toprol-XL.  Mild sinus bradycardia on ECG, patient asymptomatic.  No recurrent atrial fibrillation.  4.  Hyperlipidemia: She continues to be on simvastatin 20 mg daily.  LDL cholesterol last noted to be 70.  She has not had a recent lipid panel.  This should be done in the fasting state.  5.  Hypothyroidism: She continues to be on levothyroxine 75 mcg.  Most recent TSH 2.5 in December 2020.  6.  Right ovarian epithelial cancer: She is followed by Dr.Gorsuch.  She has tolerated therapy but has developed pancytopenia.  She recently has undergone repeat CT imaging in March 2021.  She has lymphadenopathy.  7.  Obstructive sleep apnea: Currently untreated.  She had moderate sleep apnea on diagnostic polysomnogram.  She did not tolerate CPAP.  Remotely I had discussed alternatives such as customized oral appliance.  Presently she believes she is sleeping well.  8.  Anticoagulated: No recurrent AF.  Continues to be on Xarelto without bleeding.  Medication Adjustments/Labs and Tests Ordered: Current medicines are reviewed at length with the patient today.  Concerns regarding medicines are outlined above.  Medication changes, Labs and Tests ordered today are listed in the Patient Instructions below. Patient Instructions  Medication Instructions:  CONTINUE WITH CURRENT MEDICATIONS. NO CHANGES.  *  If you need a refill on your cardiac medications before your next appointment, please call your pharmacy*   Follow-Up: At [facility], you and your health needs are our priority.  As part of our continuing mission to provide you with exceptional heart care, we have created designated Provider Care Teams.  These Care Teams include your primary Cardiologist (physician) and Advanced Practice Providers (APPs -  Physician Assistants and Nurse Practitioners) who all work together to provide you with the care you need, when you need  it.  We recommend signing up for the patient portal called "MyChart".  Sign up information is provided on this After Visit Summary.  MyChart is used to connect with patients for Virtual Visits (Telemedicine).  Patients are able to view lab/test results, encounter notes, upcoming appointments, etc.  Non-urgent messages can be sent to your provider as well.   To learn more about what you can do with MyChart, go to [email or web address].    Your next appointment:   12 month(s)  The format for your next appointment:   In Person  Provider:   Shelva Majestic, MD       Signed, Shelva Majestic, MD  04/28/2019 10:15 AM    [facility] [address], [place], [place], [place]  [address] Phone: [phone]

## 2019-04-26 NOTE — Patient Instructions (Signed)

## 2019-04-28 ENCOUNTER — Encounter: Payer: Self-pay | Admitting: Cardiovascular Disease

## 2019-04-29 ENCOUNTER — Ambulatory Visit: Payer: PPO

## 2019-04-29 ENCOUNTER — Other Ambulatory Visit: Payer: PPO

## 2019-04-30 NOTE — Progress Notes (Signed)
Pharmacist Chemotherapy Monitoring - Follow Up Assessment    I verify that I have reviewed each item in the below checklist:  . Regimen for the patient is scheduled for the appropriate day and plan matches scheduled date. Marland Kitchen Appropriate non-routine labs are ordered dependent on drug ordered. . If applicable, additional medications reviewed and ordered per protocol based on lifetime cumulative doses and/or treatment regimen.   Plan for follow-up and/or issues identified: No . I-vent associated with next due treatment: No . MD and/or nursing notified: No  Haley [name] 04/30/2019 1:35 PM

## 2019-05-06 ENCOUNTER — Inpatient Hospital Stay: Payer: PPO

## 2019-05-06 ENCOUNTER — Other Ambulatory Visit: Payer: Self-pay

## 2019-05-06 VITALS — BP 115/60 | HR 55 | Temp 99.1°F | Resp 18 | Wt 145.8 lb

## 2019-05-06 DIAGNOSIS — Z7189 Other specified counseling: Secondary | ICD-10-CM

## 2019-05-06 DIAGNOSIS — M79606 Pain in leg, unspecified: Secondary | ICD-10-CM

## 2019-05-06 DIAGNOSIS — Z5111 Encounter for antineoplastic chemotherapy: Secondary | ICD-10-CM | POA: Diagnosis not present

## 2019-05-06 DIAGNOSIS — C561 Malignant neoplasm of right ovary: Secondary | ICD-10-CM

## 2019-05-06 LAB — CBC WITH DIFFERENTIAL (CANCER CENTER ONLY)
Abs Immature Granulocytes: 0.03 10*3/uL (ref 0.00–0.07)
Basophils Absolute: 0 10*3/uL (ref 0.0–0.1)
Basophils Relative: 1 %
Eosinophils Absolute: 0.2 10*3/uL (ref 0.0–0.5)
Eosinophils Relative: 4 %
HCT: 36.3 % (ref 36.0–46.0)
Hemoglobin: 11.4 g/dL — ABNORMAL LOW (ref 12.0–15.0)
Immature Granulocytes: 1 %
Lymphocytes Relative: 27 %
Lymphs Abs: 1.4 10*3/uL (ref 0.7–4.0)
MCH: 30.6 pg (ref 26.0–34.0)
MCHC: 31.4 g/dL (ref 30.0–36.0)
MCV: 97.6 fL (ref 80.0–100.0)
Monocytes Absolute: 0.7 10*3/uL (ref 0.1–1.0)
Monocytes Relative: 14 %
Neutro Abs: 2.9 10*3/uL (ref 1.7–7.7)
Neutrophils Relative %: 53 %
Platelet Count: 193 10*3/uL (ref 150–400)
RBC: 3.72 MIL/uL — ABNORMAL LOW (ref 3.87–5.11)
RDW: 15.8 % — ABNORMAL HIGH (ref 11.5–15.5)
WBC Count: 5.3 10*3/uL (ref 4.0–10.5)
nRBC: 0 % (ref 0.0–0.2)

## 2019-05-06 LAB — CMP (CANCER CENTER ONLY)
ALT: 10 U/L (ref 0–44)
AST: 13 U/L — ABNORMAL LOW (ref 15–41)
Albumin: 3.3 g/dL — ABNORMAL LOW (ref 3.5–5.0)
Alkaline Phosphatase: 89 U/L (ref 38–126)
Anion gap: 10 (ref 5–15)
BUN: 7 mg/dL — ABNORMAL LOW (ref 8–23)
CO2: 25 mmol/L (ref 22–32)
Calcium: 9 mg/dL (ref 8.9–10.3)
Chloride: 108 mmol/L (ref 98–111)
Creatinine: 0.79 mg/dL (ref 0.44–1.00)
GFR, Est AFR Am: >60 mL/min (ref >60)
GFR, Estimated: >60 mL/min (ref >60)
Glucose, Bld: 113 mg/dL — ABNORMAL HIGH (ref 70–99)
Potassium: 3.6 mmol/L (ref 3.5–5.1)
Sodium: 143 mmol/L (ref 135–145)
Total Bilirubin: 0.5 mg/dL (ref 0.3–1.2)
Total Protein: 6.9 g/dL (ref 6.5–8.1)

## 2019-05-06 MED ORDER — PROCHLORPERAZINE MALEATE 10 MG PO TABS
ORAL_TABLET | ORAL | Status: AC
  Filled 2019-05-06: qty 1

## 2019-05-06 MED ORDER — SODIUM CHLORIDE 0.9% FLUSH
10.0000 mL | Freq: Once | INTRAVENOUS | Status: AC
  Administered 2019-05-06: 10 mL
  Filled 2019-05-06: qty 10

## 2019-05-06 MED ORDER — SODIUM CHLORIDE 0.9 % IV SOLN
Freq: Once | INTRAVENOUS | Status: AC
  Filled 2019-05-06: qty 250

## 2019-05-06 MED ORDER — HEPARIN SOD (PORK) LOCK FLUSH 100 UNIT/ML IV SOLN
500.0000 [IU] | Freq: Once | INTRAVENOUS | Status: AC | PRN
  Administered 2019-05-06: 15:00:00 500 [IU]
  Filled 2019-05-06: qty 5

## 2019-05-06 MED ORDER — PROCHLORPERAZINE MALEATE 10 MG PO TABS
10.0000 mg | ORAL_TABLET | Freq: Once | ORAL | Status: AC
  Administered 2019-05-06: 14:00:00 10 mg via ORAL

## 2019-05-06 MED ORDER — SODIUM CHLORIDE 0.9% FLUSH
10.0000 mL | INTRAVENOUS | Status: DC | PRN
  Administered 2019-05-06: 10 mL
  Filled 2019-05-06: qty 10

## 2019-05-06 MED ORDER — SODIUM CHLORIDE 0.9 % IV SOLN
600.0000 mg/m2 | Freq: Once | INTRAVENOUS | Status: AC
  Administered 2019-05-06: 14:00:00 1026 mg via INTRAVENOUS
  Filled 2019-05-06: qty 26.98

## 2019-05-06 NOTE — Patient Instructions (Signed)
Gemcitabine injection What is this medicine? GEMCITABINE (jem SYE ta been) is a chemotherapy drug. This medicine is used to treat many types of cancer like breast cancer, lung cancer, pancreatic cancer, and ovarian cancer. This medicine may be used for other purposes; ask your health care provider or pharmacist if you have questions. COMMON BRAND NAME(S): Gemzar, Infugem What should I tell my health care provider before I take this medicine? They need to know if you have any of these conditions:  blood disorders  infection  kidney disease  liver disease  lung or breathing disease, like asthma  recent or ongoing radiation therapy  an unusual or allergic reaction to gemcitabine, other chemotherapy, other medicines, foods, dyes, or preservatives  pregnant or trying to get pregnant  breast-feeding How should I use this medicine? This drug is given as an infusion into a vein. It is administered in a hospital or clinic by a specially trained health care professional. Talk to your pediatrician regarding the use of this medicine in children. Special care may be needed. Overdosage: If you think you have taken too much of this medicine contact a poison control center or emergency room at once. NOTE: This medicine is only for you. Do not share this medicine with others. What if I miss a dose? It is important not to miss your dose. Call your doctor or health care professional if you are unable to keep an appointment. What may interact with this medicine?  medicines to increase blood counts like filgrastim, pegfilgrastim, sargramostim  some other chemotherapy drugs like cisplatin  vaccines Talk to your doctor or health care professional before taking any of these medicines:  acetaminophen  aspirin  ibuprofen  ketoprofen  naproxen This list may not describe all possible interactions. Give your health care provider a list of all the medicines, herbs, non-prescription drugs, or  dietary supplements you use. Also tell them if you smoke, drink alcohol, or use illegal drugs. Some items may interact with your medicine. What should I watch for while using this medicine? Visit your doctor for checks on your progress. This drug may make you feel generally unwell. This is not uncommon, as chemotherapy can affect healthy cells as well as cancer cells. Report any side effects. Continue your course of treatment even though you feel ill unless your doctor tells you to stop. In some cases, you may be given additional medicines to help with side effects. Follow all directions for their use. Call your doctor or health care professional for advice if you get a fever, chills or sore throat, or other symptoms of a cold or flu. Do not treat yourself. This drug decreases your body's ability to fight infections. Try to avoid being around people who are sick. This medicine may increase your risk to bruise or bleed. Call your doctor or health care professional if you notice any unusual bleeding. Be careful brushing and flossing your teeth or using a toothpick because you may get an infection or bleed more easily. If you have any dental work done, tell your dentist you are receiving this medicine. Avoid taking products that contain aspirin, acetaminophen, ibuprofen, naproxen, or ketoprofen unless instructed by your doctor. These medicines may hide a fever. Do not become pregnant while taking this medicine or for 6 months after stopping it. Women should inform their doctor if they wish to become pregnant or think they might be pregnant. Men should not father a child while taking this medicine and for 3 months after stopping it.   There is a potential for serious side effects to an unborn child. Talk to your health care professional or pharmacist for more information. Do not breast-feed an infant while taking this medicine or for at least 1 week after stopping it. Men should inform their doctors if they wish  to father a child. This medicine may lower sperm counts. Talk with your doctor or health care professional if you are concerned about your fertility. What side effects may I notice from receiving this medicine? Side effects that you should report to your doctor or health care professional as soon as possible:  allergic reactions like skin rash, itching or hives, swelling of the face, lips, or tongue  breathing problems  pain, redness, or irritation at site where injected  signs and symptoms of a dangerous change in heartbeat or heart rhythm like chest pain; dizziness; fast or irregular heartbeat; palpitations; feeling faint or lightheaded, falls; breathing problems  signs of decreased platelets or bleeding - bruising, pinpoint red spots on the skin, black, tarry stools, blood in the urine  signs of decreased red blood cells - unusually weak or tired, feeling faint or lightheaded, falls  signs of infection - fever or chills, cough, sore throat, pain or difficulty passing urine  signs and symptoms of kidney injury like trouble passing urine or change in the amount of urine  signs and symptoms of liver injury like dark yellow or brown urine; general ill feeling or flu-like symptoms; light-colored stools; loss of appetite; nausea; right upper belly pain; unusually weak or tired; yellowing of the eyes or skin  swelling of ankles, feet, hands Side effects that usually do not require medical attention (report to your doctor or health care professional if they continue or are bothersome):  constipation  diarrhea  hair loss  loss of appetite  nausea  rash  vomiting This list may not describe all possible side effects. Call your doctor for medical advice about side effects. You may report side effects to FDA at 1-800-FDA-1088. Where should I keep my medicine? This drug is given in a hospital or clinic and will not be stored at home. NOTE: This sheet is a summary. It may not cover all  possible information. If you have questions about this medicine, talk to your doctor, pharmacist, or health care provider.  2020 [employer] (2017-03-22 18:06:11)  

## 2019-05-07 ENCOUNTER — Other Ambulatory Visit: Payer: Self-pay | Admitting: Hematology and Oncology

## 2019-05-07 ENCOUNTER — Telehealth: Payer: Self-pay | Admitting: Oncology

## 2019-05-07 MED ORDER — HYDROMORPHONE HCL 2 MG PO TABS: 2.0000 mg | ORAL_TABLET | ORAL | 0 refills | Status: DC | PRN

## 2019-05-07 NOTE — Telephone Encounter (Signed)
done

## 2019-05-07 NOTE — Telephone Encounter (Signed)
Taziya said she is having body aches after chemotherapy yesterday and requested a refill on hydromorphone to be sent to the [facility] in [place].

## 2019-05-07 NOTE — Progress Notes (Signed)
Pharmacist Chemotherapy Monitoring - Follow Up Assessment    I verify that I have reviewed each item in the below checklist:  . Regimen for the patient is scheduled for the appropriate day and plan matches scheduled date. Marland Kitchen Appropriate non-routine labs are ordered dependent on drug ordered. . If applicable, additional medications reviewed and ordered per protocol based on lifetime cumulative doses and/or treatment regimen.   Plan for follow-up and/or issues identified: No . I-vent associated with next due treatment: No . MD and/or nursing notified: No  Iola Turri D 05/07/2019 1:44 PM

## 2019-05-07 NOTE — Telephone Encounter (Signed)
Called Karisha back and let her know the refill has been sent in.

## 2019-05-09 ENCOUNTER — Telehealth: Payer: Self-pay | Admitting: Family Medicine

## 2019-05-09 NOTE — Chronic Care Management (AMB) (Signed)
  Chronic Care Management   Outreach Note  05/09/2019 Name: Ethelee Gagel [facility] MRN: [id number] DOB: [birth year]  Referred by: [place], Whitney G, MD Reason for referral : Chronic Care Management   An unsuccessful telephone outreach was attempted today. The patient was referred to the pharmacist for assistance with care management and care coordination.   Follow Up Plan:   [place]

## 2019-05-13 ENCOUNTER — Inpatient Hospital Stay: Payer: PPO | Admitting: Hematology and Oncology

## 2019-05-13 ENCOUNTER — Other Ambulatory Visit: Payer: Self-pay

## 2019-05-13 ENCOUNTER — Inpatient Hospital Stay: Payer: PPO | Attending: Obstetrics

## 2019-05-13 ENCOUNTER — Inpatient Hospital Stay: Payer: PPO

## 2019-05-13 ENCOUNTER — Encounter: Payer: Self-pay | Admitting: Hematology and Oncology

## 2019-05-13 DIAGNOSIS — C561 Malignant neoplasm of right ovary: Secondary | ICD-10-CM | POA: Diagnosis not present

## 2019-05-13 DIAGNOSIS — Z79899 Other long term (current) drug therapy: Secondary | ICD-10-CM | POA: Insufficient documentation

## 2019-05-13 DIAGNOSIS — M79606 Pain in leg, unspecified: Secondary | ICD-10-CM

## 2019-05-13 DIAGNOSIS — R590 Localized enlarged lymph nodes: Secondary | ICD-10-CM | POA: Insufficient documentation

## 2019-05-13 DIAGNOSIS — D61818 Other pancytopenia: Secondary | ICD-10-CM | POA: Diagnosis not present

## 2019-05-13 DIAGNOSIS — C773 Secondary and unspecified malignant neoplasm of axilla and upper limb lymph nodes: Secondary | ICD-10-CM | POA: Insufficient documentation

## 2019-05-13 DIAGNOSIS — Z5111 Encounter for antineoplastic chemotherapy: Secondary | ICD-10-CM | POA: Diagnosis not present

## 2019-05-13 DIAGNOSIS — L989 Disorder of the skin and subcutaneous tissue, unspecified: Secondary | ICD-10-CM | POA: Diagnosis not present

## 2019-05-13 DIAGNOSIS — Z7189 Other specified counseling: Secondary | ICD-10-CM

## 2019-05-13 DIAGNOSIS — Z7901 Long term (current) use of anticoagulants: Secondary | ICD-10-CM | POA: Diagnosis not present

## 2019-05-13 DIAGNOSIS — C786 Secondary malignant neoplasm of retroperitoneum and peritoneum: Secondary | ICD-10-CM | POA: Diagnosis not present

## 2019-05-13 LAB — CBC WITH DIFFERENTIAL (CANCER CENTER ONLY)
Abs Immature Granulocytes: 0.05 10*3/uL (ref 0.00–0.07)
Basophils Absolute: 0 10*3/uL (ref 0.0–0.1)
Basophils Relative: 1 %
Eosinophils Absolute: 0.1 10*3/uL (ref 0.0–0.5)
Eosinophils Relative: 1 %
HCT: 33 % — ABNORMAL LOW (ref 36.0–46.0)
Hemoglobin: 10.5 g/dL — ABNORMAL LOW (ref 12.0–15.0)
Immature Granulocytes: 1 %
Lymphocytes Relative: 45 %
Lymphs Abs: 1.8 10*3/uL (ref 0.7–4.0)
MCH: 30.5 pg (ref 26.0–34.0)
MCHC: 31.8 g/dL (ref 30.0–36.0)
MCV: 95.9 fL (ref 80.0–100.0)
Monocytes Absolute: 0.7 10*3/uL (ref 0.1–1.0)
Monocytes Relative: 18 %
Neutro Abs: 1.4 10*3/uL — ABNORMAL LOW (ref 1.7–7.7)
Neutrophils Relative %: 34 %
Platelet Count: 155 10*3/uL (ref 150–400)
RBC: 3.44 MIL/uL — ABNORMAL LOW (ref 3.87–5.11)
RDW: 14.9 % (ref 11.5–15.5)
WBC Count: 4 10*3/uL (ref 4.0–10.5)
nRBC: 0.8 % — ABNORMAL HIGH (ref 0.0–0.2)

## 2019-05-13 LAB — CMP (CANCER CENTER ONLY)
ALT: 11 U/L (ref 0–44)
AST: 15 U/L (ref 15–41)
Albumin: 3.2 g/dL — ABNORMAL LOW (ref 3.5–5.0)
Alkaline Phosphatase: 82 U/L (ref 38–126)
Anion gap: 9 (ref 5–15)
BUN: 7 mg/dL — ABNORMAL LOW (ref 8–23)
CO2: 27 mmol/L (ref 22–32)
Calcium: 9.3 mg/dL (ref 8.9–10.3)
Chloride: 106 mmol/L (ref 98–111)
Creatinine: 0.74 mg/dL (ref 0.44–1.00)
GFR, Est AFR Am: >60 mL/min (ref >60)
GFR, Estimated: >60 mL/min (ref >60)
Glucose, Bld: 90 mg/dL (ref 70–99)
Potassium: 3.6 mmol/L (ref 3.5–5.1)
Sodium: 142 mmol/L (ref 135–145)
Total Bilirubin: 0.4 mg/dL (ref 0.3–1.2)
Total Protein: 6.7 g/dL (ref 6.5–8.1)

## 2019-05-13 MED ORDER — SODIUM CHLORIDE 0.9% FLUSH
10.0000 mL | INTRAVENOUS | Status: DC | PRN
  Administered 2019-05-13: 10 mL
  Filled 2019-05-13: qty 10

## 2019-05-13 MED ORDER — PROCHLORPERAZINE MALEATE 10 MG PO TABS
ORAL_TABLET | ORAL | Status: AC
  Filled 2019-05-13: qty 1

## 2019-05-13 MED ORDER — SODIUM CHLORIDE 0.9 % IV SOLN
Freq: Once | INTRAVENOUS | Status: AC
  Filled 2019-05-13: qty 250

## 2019-05-13 MED ORDER — HEPARIN SOD (PORK) LOCK FLUSH 100 UNIT/ML IV SOLN
500.0000 [IU] | Freq: Once | INTRAVENOUS | Status: AC | PRN
  Administered 2019-05-13: 500 [IU]
  Filled 2019-05-13: qty 5

## 2019-05-13 MED ORDER — SODIUM CHLORIDE 0.9 % IV SOLN
600.0000 mg/m2 | Freq: Once | INTRAVENOUS | Status: AC
  Administered 2019-05-13: 1026 mg via INTRAVENOUS
  Filled 2019-05-13: qty 26.98

## 2019-05-13 MED ORDER — PROCHLORPERAZINE MALEATE 10 MG PO TABS
10.0000 mg | ORAL_TABLET | Freq: Once | ORAL | Status: AC
  Administered 2019-05-13: 10 mg via ORAL

## 2019-05-13 NOTE — Assessment & Plan Note (Signed)
On exam, the palpable lymphadenopathy is softer Overall, I think she had positive response to treatment Observe for now

## 2019-05-13 NOTE — Patient Instructions (Signed)
[facility] °Discharge Instructions for Patients Receiving Chemotherapy ° °Today you received the following chemotherapy agents Gemzar ° °To help prevent nausea and vomiting after your treatment, we encourage you to take your nausea medication as directed. °  °If you develop nausea and vomiting that is not controlled by your nausea medication, call the clinic.  ° °BELOW ARE SYMPTOMS THAT SHOULD BE REPORTED IMMEDIATELY: °· *FEVER GREATER THAN 100.5 F °· *CHILLS WITH OR WITHOUT FEVER °· NAUSEA AND VOMITING THAT IS NOT CONTROLLED WITH YOUR NAUSEA MEDICATION °· *UNUSUAL SHORTNESS OF BREATH °· *UNUSUAL BRUISING OR BLEEDING °· TENDERNESS IN MOUTH AND THROAT WITH OR WITHOUT PRESENCE OF ULCERS °· *URINARY PROBLEMS °· *BOWEL PROBLEMS °· UNUSUAL RASH °Items with * indicate a potential emergency and should be followed up as soon as possible. ° °Feel free to call the clinic should you have any questions or concerns. The clinic phone number is [phone]. ° °Please show the CHEMO ALERT CARD at check-in to the Emergency Department and triage nurse. ° ° °

## 2019-05-13 NOTE — Assessment & Plan Note (Signed)
So far, she tolerated treatment well except for mild pancytopenia and occasional tiredness The palpable lymph node on the left axilla felt softer Overall, I think she has a positive response to treatment We will proceed with treatment without delay I will see her again in a few weeks for further follow-up Depending on the physical examination in her next visit, I will decide the timing of her next imaging study

## 2019-05-13 NOTE — Assessment & Plan Note (Signed)
This is due to treatment She is not symptomatic, observe only for now Due to chronic pancytopenia, her treatment cycle is on day 1 and 8 only, rest day 15 for cycle of every 21 days

## 2019-05-13 NOTE — Progress Notes (Signed)
Per Dr. Gorsuch, ok to treat with ANC 1.4. 

## 2019-05-13 NOTE — Progress Notes (Signed)
[place] OFFICE PROGRESS NOTE  Patient Care Team: Martin[place], Tamecka G, MD as PCP - General (Family Medicine) Troy Sine, MD as PCP - Cardiology (Cardiology) Awanda Mink Craige Cotta, RN as Oncology Nurse Navigator (Oncology) Eli Hose, [facility] as Pharmacist (Pharmacist)  ASSESSMENT & PLAN:  Right ovarian epithelial cancer [facility]) So far, she tolerated treatment well except for mild pancytopenia and occasional tiredness The palpable lymph node on the left axilla felt softer Overall, I think she has a positive response to treatment We will proceed with treatment without delay I will see her again in a few weeks for further follow-up Depending on the physical examination in her next visit, I will decide the timing of her next imaging study  Pancytopenia, acquired ([place]) This is due to treatment She is not symptomatic, observe only for now Due to chronic pancytopenia, her treatment cycle is on day 1 and 8 only, rest day 15 for cycle of every 21 days  Lymphadenopathy, axillary On exam, the palpable lymphadenopathy is softer Overall, I think she had positive response to treatment Observe for now   No orders of the defined types were placed in this encounter.   All questions were answered. The patient knows to call the clinic with any problems, questions or concerns. The total time spent in the appointment was 20 minutes encounter with patients including review of chart and various tests results, discussions about plan of care and coordination of care plan   Heath Lark, MD 05/13/2019 11:51 AM  INTERVAL HISTORY: Please see below for problem oriented charting. She returns for cycle 2, day 8 of treatment She is doing well overall No recent nausea She complained of sedation after treatment Denies constipation She continues to have intermittent pain at the biopsy site but she has not taken much pain medicine lately No recent infection, fever or chills  SUMMARY OF ONCOLOGIC  HISTORY: Oncology History Overview Note  Neg for genetics blood work and HRD High grade serous, recurrent biopsy proven PD-L1 5%   Right ovarian epithelial cancer ([place])  01/06/2018 Imaging   Ct abdomen and pelvis 1. Complex cystic mass at the right adnexa, measuring 5.9 x 4.0 cm, with nodular components, concerning for primary ovarian malignancy. 2. Diffuse nodularity along the omentum at the left side of the abdomen, extending into the mesentery at the left mid abdomen, concerning for peritoneal carcinomatosis. 3. Wall thickening at the distal ileum adjacent to the ovarian mass; bowel loops appear somewhat adherent to the ovarian mass. Bowel infiltration with tumor cannot be excluded. No evidence of bowel obstruction at this time. 4. Small volume ascites within the abdomen and pelvis.  Aortic Atherosclerosis (ICD10-I70.0).   01/08/2018 Tumor Marker   Patient's tumor was tested for the following markers: CA-125. Results of the tumor marker test revealed 3004   01/15/2018 Imaging   Chest CT:  1. No active cardiopulmonary disease. 2. Aortic atherosclerosis without aneurysm or dissection. 3. No large central pulmonary embolus.  CT AP:  1. Dilated fluid-filled loops of small bowel are redemonstrated slightly more extensive than on prior exam with transition point likely in the right adnexa adjacent to a complex cystic mass concerning for ovarian neoplasm given septations and soft tissue nodularity. This raises concern for early or partial SBO. This soft tissue mass measures 4.9 x 4 x 4.7 cm and has not changed since prior recent comparison. Additional short segmental area of luminal narrowing is noted in the right lower quadrant involving small bowel for which stigmata  of peritoneal carcinomatosis or small-bowel metastatic implants might account for this. 2. Redemonstration of small volume of ascites predominantly in the upper abdomen surrounding the liver and spleen. 3. Redemonstration  of thick bandlike omental thickening concerning for peritoneal carcinomatosis.    01/15/2018 Procedure   Successful ultrasound-guided diagnostic and therapeutic paracentesis yielding 1.5 liters of peritoneal fluid.    01/15/2018 Pathology Results   PERITONEAL/ASCITIC FLUID (SPECIMEN 1 OF 1 COLLECTED 01/18/18): MALIGNANT CELLS CONSISTENT WITH METASTATIC ADENOCARCINOMA. SEE COMMENT. COMMENT: THE MALIGNANT CELLS ARE POSITIVE FOR MOC-31, CYTOKERATIN 7, ESTROGEN RECEPTOR, PAX-8, AND WT-1. THEY ARE NEGATIVE FOR CALRETININ, CYTOKERATIN 5/6, AND CYTOKERATIN 20. THE PROFILE IS CONSISTENT WITH A PRIMARY GYNECOLOGIC CARCINOMA. THERE IS LIKELY SUFFICIENT TUMOR PRESENT, IF ADDITIONAL STUDIES ARE REQUESTED.   01/15/2018 - 02/02/2018 Hospital Admission   She was admitted to the hospital for SBO. She was treated with chemotherapy   01/18/2018 - 06/20/2018 Chemotherapy   The patient had carboplatin and taxol   02/08/2018 Cancer Staging   Staging form: Ovary, Fallopian Tube, and Primary Peritoneal Carcinoma, AJCC 8th Edition - Clinical: cT3, cN0, cM0 - Signed by Heath Lark, MD on 02/08/2018   02/08/2018 Tumor Marker   Patient's tumor was tested for the following markers: CA-125. Results of the tumor marker test revealed 445   03/02/2018 Tumor Marker   Patient's tumor was tested for the following markers: CA-125. Results of the tumor marker test revealed 174   03/15/2018 Imaging   6.2 cm complex cystic right ovarian mass, compatible with malignant ovarian neoplasm, mildly progressive.  Associated peritoneal disease/omental caking beneath the anterior abdominal wall, mildly improved. Prior abdominal ascites is improved/resolved.  4.7 cm cystic left ovarian mass, without overt malignant features, grossly unchanged.  Mild bilateral hydroureteronephrosis, secondary to extrinsic compression, new.  Prior small bowel obstruction has improved/resolved.    03/27/2018 Pathology Results   1. Omentum, resection  for tumor - OMENTUM: - HIGH GRADE SEROUS CARCINOMA. 2. Ovary, left - LEFT OVARY: - HIGH GRADE SEROUS CARCINOMA. - LEFT FALLOPIAN TUBE: - HIGH GRADE SEROUS CARCINOMA. 3. Adnexa - ovary +/- tube, neoplastic, right - RIGHT OVARY: - HIGH GRADE SEROUS CARCINOMA, 5.5 CM. - RIGHT FALLOPIAN TUBE: - HIGH GRADE SEROUS CARCINOMA. 4. Peritoneum, biopsy, nodule - PERITONEUM: - NODULES OF HIGH GRADE SEROUS CARCINOMA. 5. Peritoneum, resection for tumor, rectosigmoid - RECTOSIGMOID PERITONEUM: - HIGH GRADE SEROUS CARCINOMA. Microscopic Comment 3. OVARY or FALLOPIAN TUBE or PRIMARY PERITONEUM: Procedure: Bilateral salpingo-oophorectomy, omentectomy and peritoneal biopsies. Specimen Integrity: N/A. Tumor Site: Right ovary. Ovarian Surface Involvement (required only if applicable): Yes. Fallopian Tube Surface Involvement (required only if applicable): Yes. Tumor Size: 5.5 x 4.8 x 4.0 cm. Histologic Type: Serous carcinoma. Histologic Grade: High grade. Implants (required for advanced stage serous/seromucinous borderline tumors only): Omentum, left ovary and fallopian tube, rectosigmoid peritoneum and peritoneum. Other Tissue/ Organ Involvement: Left ovary and fallopian tube, omentum and rectosigmoid peritoneum. Largest Extrapelvic Peritoneal Focus (required only if applicable): 01.6 cm, omentum. Peritoneal/Ascitic Fluid: N/A. Treatment Effect (required only for high-grade serous carcinomas): Minimal. Regional Lymph Nodes: No lymph nodes submitted. Pathologic Stage Classification (pTNM, AJCC 8th Edition): pT3c, pNX. Representative Tumor Block: 3A, 3B, 3D, 3E and 108F. Comment(s): There is a 5.5 cm in greatest dimension partially cystic high grade serous carcinoma involving the right adnexal specimen and the tumor is staged as a primary right ovarian carcinoma. The carcinoma also involves the left ovary as well as the left fallopian tube, the omentum, peritoneum and the rectosigmoid peritoneal  specimens.   03/27/2018 Surgery  Preoperative Diagnosis: ovarian cancer, stage IIIC, metastatic to omentum, peritoneum, serosa of intestines   Procedure(s) Performed: 1. Exploratory laparotomy with bilateral salpingo-oophorectomy, omentectomy radical tumor debulking for ovarian cancer .  Surgeon: Thereasa Solo, MD.   Specimens: Bilateral tubes / ovaries, omentum. Peritoneal nodules, rectosigmoid nodules.    Operative Findings: omental cake, miliary studding on diaphragm and bilateral paracolic gutters. Sigmoid colon densely adherent to left and right ovaries with tumor rind, ureters mildly dilated bilaterally due to retroperitoneal extension of the tumor towards ureters causing compression.    This represented an optimal cytoreduction (R1) with gross residual disease on the diaphragm that had been ablated and a thin tumor plaque on the sigmoid colon that was ablated. No residual disease >1cm remaining   05/07/2018 Genetic Testing   Negative genetic testing on the [employer] TumorNextHRD+ CancerNext Panel. The CancerNext gene panel offered by [employer] includes sequencing and rearrangement analysis for the following 34 genes:   APC, ATM, BARD1, BMPR1A, BRCA1, BRCA2, BRIP1, CDH1, CDK4, CDKN2A, CHEK2, DICER1, EPCAM, GREM1, HOXB13, MLH1, MRE11A, MSH2, MSH6, MUTYH, NBN, NF1, PALB2, PMS2, POLD1, POLE, PTEN, RAD50, RAD51C, RAD51D, SMAD4, SMARCA4, STK11, and TP53. Somatic genes analyzed through TumorNext-HRD: ATM, BARD1, BRCA1, BRCA2, BRIP1, CHEK2, MRE11A, NBN, PALB2, RAD51C, RAD51D. The report date is 05/07/2018.   05/11/2018 Tumor Marker   Patient's tumor was tested for the following markers: CA-125. Results of the tumor marker test revealed 60.5   07/02/2018 Imaging   1. Mild right pericardiophrenic adenopathy is mildly increased, suspicious for metastatic nodes. No abdominopelvic adenopathy. 2. Small left peritoneal soft tissue nodule adjacent to the splenic flexure and smooth left pelvic sidewall  peritoneal thickening, cannot exclude residual/recurrent disease. Attention on follow-up CT advised. 3. Bilateral renal collecting system dilatation has improved. 4.  Aortic Atherosclerosis (ICD10-I70.0).   07/02/2018 Tumor Marker   Patient's tumor was tested for the following markers: CA-125 Results of the tumor marker test revealed 42.7   07/11/2018 Echocardiogram   1. The left ventricle has normal systolic function with an ejection fraction of 60-65%. The cavity size was normal. Left ventricular diastolic parameters were normal.  2. Normal GLS -20.1.  3. The right ventricle has normal systolic function. The cavity was normal. There is no increase in right ventricular wall thickness.  4. The mitral valve is degenerative. Moderate thickening of the mitral valve leaflet. Moderate calcification of the mitral valve leaflet.  5. The aortic valve is tricuspid. Moderate thickening of the aortic valve. Moderate calcification of the aortic valve. Aortic valve regurgitation is trivial by color flow Doppler.  6. The aortic root is normal in size and structure.    Chemotherapy   The patient had doxorubicin and bevacizumab for chemotherapy treatment.     07/30/2018 Tumor Marker   Patient's tumor was tested for the following markers: CA-125 Results of the tumor marker test revealed 82.6   08/27/2018 Tumor Marker   Patient's tumor was tested for the following markers: CA-125 Results of the tumor marker test revealed 111.   09/24/2018 Tumor Marker   Patient's tumor was tested for the following markers: CA-125 Results of the tumor marker test revealed 142.   10/05/2018 Imaging   1. Stable right juxta diaphragmatic/pericardiophrenic lymph node, mildly enlarged. 2. Otherwise no definite metastatic disease identified in the abdomen/pelvis. 3. 3 mm peritoneal nodule noted left paracolic gutter on today's study, indeterminate. Attention on follow-up recommended. 4.  Aortic Atherosclerois (ICD10-170.0)    10/16/2018 Echocardiogram    1. No significant change from prior study (  07/11/2018).  2. Left ventricular ejection fraction, by visual estimation, is 60 to 65%. The left ventricle has normal function. Normal left ventricular size. There is no left ventricular hypertrophy.  3. LVEF by 3D assessment 63%.  4. The average left ventricular global longitudinal strain is -22.4 %.  5. Global right ventricle has normal systolic function.The right ventricular size is normal. No increase in right ventricular wall thickness.  6. Left atrial size was normal.  7. Right atrial size was normal.  8. Presence of pericardial fat pad.  9. Moderate thickening of the mitral valve leaflet(s). 10. The mitral valve is normal in structure. Trace mitral valve regurgitation. 11. The tricuspid valve is normal in structure. Tricuspid valve regurgitation is mild. 12. Aortic regurgitation PHT measures 467 msec. 13. The aortic valve is tricuspid Aortic valve regurgitation is mild by color flow Doppler. Mild aortic valve sclerosis without stenosis. 14. There is mild to moderate calcification of the AoV, with focal calcification of the [place]. The aortic regurgitation is mild in severity, likely related to degenerative valve disease. 15. The pulmonic valve was grossly normal. Pulmonic valve regurgitation is not visualized by color flow Doppler. 16. Mild plaque invoving the ascending aorta. 17. Normal pulmonary artery systolic pressure. 18. The tricuspid regurgitant velocity is 2.52 m/s, and with an assumed right atrial pressure of 3 mmHg, the estimated right ventricular systolic pressure is normal at 28.4 mmHg.   10/26/2018 Tumor Marker   Patient's tumor was tested for the following markers: CA-125 Results of the tumor marker test revealed 196   12/11/2018 Imaging   1. No substantial interval change in exam. No definite findings to suggest recurrent/metastatic disease. 2. Right pericardial phrenic lymph node noted on multiple  prior studies is unchanged in the interval. 3. 8 mm omental nodule identified on today's exam. Close attention on follow-up recommended. 3 mm soft tissue nodule along the left paracolic gutter seen previously has resolved in the interval. 4.  Aortic Atherosclerois (ICD10-170.0)   04/02/2019 Imaging   1. Interval development of left axillary and subpectoral lymphadenopathy is highly concerning for metastatic disease, however, this would be a highly unusual pattern of metastatic spread for an ovarian primary neoplasm. This is most concerning for potential left-sided breast cancer. Correlation with mammography is strongly recommended. 2. No other signs of definite metastatic disease noted elsewhere in the chest, abdomen or pelvis. 3. Aortic atherosclerosis. 4. Additional incidental findings, similar to prior studies, as above.     04/03/2019 Pathology Results   Immunohistochemistry shows the carcinoma is positive with cytokeratin AE1/AE3, cytokeratin 7, PAX-8, WT-1, and shows patchy positivity with estrogen receptor. The carcinoma is negative with GATA3, GCDFP, p53, Napsin A and TTF-1. The immunophenotype is most consistent with metastatic high grade serous carcinoma. (JDP:ah 04/04/19)FINAL DIAGNOSIS Diagnosis Lymph node, needle/core biopsy, left axilla - METASTATIC CARCINOMA. - SEE MICROSCOPIC DESCRIPT    Genetic Testing   Patient has genetic testing done for PD-L1. Results revealed patient has the following: PD-L1 staining in tumor cells (TC): 3% PD-L1 staining in tumor-associated immune cells (IC): 10% PD-L1 combined positive score (CPS): 5%   04/15/2019 -  Chemotherapy   The patient had gemzar for chemotherapy treatment.       REVIEW OF SYSTEMS:   Constitutional: Denies fevers, chills or abnormal weight loss Eyes: Denies blurriness of vision Ears, nose, mouth, throat, and face: Denies mucositis or sore throat Respiratory: Denies cough, dyspnea or wheezes Cardiovascular: Denies  palpitation, chest discomfort or lower extremity swelling Gastrointestinal:  Denies nausea, heartburn  or change in bowel habits Skin: Denies abnormal skin rashes Neurological:Denies numbness, tingling or new weaknesses Behavioral/Psych: Mood is stable, no new changes  All other systems were reviewed with the patient and are negative.  I have reviewed the past medical history, past surgical history, social history and family history with the patient and they are unchanged from previous note.  ALLERGIES:  is allergic to tramadol hcl.  MEDICATIONS:  Current Outpatient Medications  Medication Sig Dispense Refill  . acetaminophen (TYLENOL) 500 MG tablet Take 2 tablets (1,000 mg total) by mouth every 6 (six) hours. (Patient taking differently: Take 1,000 mg by mouth every 6 (six) hours as needed. ) 30 tablet 0  . ALPRAZolam (XANAX) 0.5 MG tablet TAKE 1/2 TO 1 (ONE-HALF TO ONE) TABLET BY MOUTH ONCE DAILY AS NEEDED FOR ANXIETY 30 tablet 2  . alum & mag hydroxide-simeth (MAALOX/MYLANTA) 200-200-20 MG/5ML suspension Take 30 mLs by mouth every 6 (six) hours as needed for indigestion or heartburn. 355 mL 0  . Artificial Saliva (SALIVASURE) LOZG Use as directed 1 lozenge in the mouth or throat every 4 (four) hours as needed. 90 lozenge 0  . Calcium Carbonate (CALTRATE 600) 1500 MG TABS Take 600 mg of elemental calcium by mouth daily.     . cholecalciferol (VITAMIN D) 1000 UNITS tablet Take 1,000 Units by mouth daily.     Marland Kitchen estradiol (ESTRACE) 0.1 MG/GM vaginal cream Place 1 Applicatorful vaginally 3 (three) times a week. 42.5 g 12  . fluticasone (FLONASE) 50 MCG/ACT nasal spray Place 2 sprays into both nostrils daily. 48 g 2  . gabapentin (NEURONTIN) 300 MG capsule Take 1 capsule (300 mg total) by mouth 2 (two) times daily. 60 capsule 11  . HYDROmorphone (DILAUDID) 2 MG tablet Take 1 tablet (2 mg total) by mouth every 4 (four) hours as needed for severe pain. 30 tablet 0  . lactose free nutrition (BOOST  PLUS) LIQD Take 237 mLs by mouth daily. (Patient taking differently: Take 237 mLs by mouth 2 (two) times daily between meals. ) 10 Can 0  . levothyroxine (SYNTHROID) 75 MCG tablet Take 1 tablet (75 mcg total) by mouth daily. 90 tablet 3  . lidocaine-prilocaine (EMLA) cream Apply to affected area once 30 g 3  . Magnesium 400 MG TABS Take 250 mg by mouth daily.    . Menthol-Methyl Salicylate (MUSCLE RUB) 10-15 % CREA Apply 1 application topically as needed for muscle pain.    . metoprolol succinate (TOPROL-XL) 50 MG 24 hr tablet Take 1 tablet (50 mg total) by mouth daily. Take with or immediately following a meal. 90 tablet 2  . Multiple Vitamins-Minerals (MULTIVITAMIN WITH MINERALS) tablet Take 1 tablet by mouth daily.      Marland Kitchen nystatin (MYCOSTATIN) 100000 UNIT/ML suspension Take 5 mLs (500,000 Units total) by mouth 4 (four) times daily. 473 mL 0  . Omega-3 Fatty Acids (FISH OIL) 1000 MG CAPS Take 2,000 mg by mouth 2 (two) times daily.     Marland Kitchen omeprazole (PRILOSEC) 40 MG capsule Take 1 capsule (40 mg total) by mouth daily. 90 capsule 3  . ondansetron (ZOFRAN) 8 MG tablet Take 1 tablet (8 mg total) by mouth every 8 (eight) hours as needed (Nausea or vomiting). 30 tablet 1  . polyethylene glycol (MIRALAX / GLYCOLAX) packet Take 17 g by mouth daily. 14 each 0  . prochlorperazine (COMPAZINE) 10 MG tablet Take 1 tablet (10 mg total) by mouth every 6 (six) hours as needed (Nausea or vomiting). 30 tablet  1  . senna (SENOKOT) 8.6 MG TABS tablet Take 1 tablet (8.6 mg total) by mouth at bedtime as needed for mild constipation. 120 each 0  . simvastatin (ZOCOR) 20 MG tablet Take 1 tablet (20 mg total) by mouth at bedtime. 90 tablet 1  . vitamin C (ASCORBIC ACID) 500 MG tablet Take 1,000 mg by mouth daily.    [name] 20 MG TABS tablet TAKE 1 TABLET BY MOUTH ONCE DAILY WITH  SUPPER 90 tablet 1   No current facility-administered medications for this visit.   Facility-Administered Medications Ordered in Other  Visits  Medication Dose Route Frequency Provider Last Rate Last Admin  . gemcitabine (GEMZAR) 1,026 mg in sodium chloride 0.9 % 250 mL chemo infusion  600 mg/m2 (Treatment Plan Recorded) Intravenous Once Alvy Bimler, Micheline Markes, MD      . heparin lock flush 100 unit/mL  500 Units Intracatheter Once PRN Alvy Bimler, Hannie Shoe, MD      . sodium chloride flush (NS) 0.9 % injection 10 mL  10 mL Intracatheter PRN Alvy Bimler, Lutricia Widjaja, MD        PHYSICAL EXAMINATION: ECOG PERFORMANCE STATUS: 1 - Symptomatic but completely ambulatory  Vitals:   05/13/19 1108  BP: (!) 153/72  Pulse: (!) 50  Resp: 18  Temp: 98.2 F (36.8 C)  SpO2: 100%   Filed Weights   05/13/19 1108  Weight: 148 lb 3.2 oz (67.2 kg)    GENERAL:alert, no distress and comfortable SKIN: skin color, texture, turgor are normal, no rashes or significant lesions EYES: normal, Conjunctiva are pink and non-injected, sclera clear OROPHARYNX:no exudate, no erythema and lips, buccal mucosa, and tongue normal  NECK: supple, thyroid normal size, non-tender, without nodularity LYMPH: The palpable lymphadenopathy in the left axilla is a bit softer LUNGS: clear to auscultation and percussion with normal breathing effort HEART: regular rate & rhythm and no murmurs and no lower extremity edema ABDOMEN:abdomen soft, non-tender and normal bowel sounds Musculoskeletal:no cyanosis of digits and no clubbing  NEURO: alert & oriented x 3 with fluent speech, no focal motor/sensory deficits  LABORATORY DATA:  I have reviewed the data as listed    Component Value Date/Time   NA 142 05/13/2019 1038   K 3.6 05/13/2019 1038   CL 106 05/13/2019 1038   CO2 27 05/13/2019 1038   GLUCOSE 90 05/13/2019 1038   BUN 7 (L) 05/13/2019 1038   CREATININE 0.74 05/13/2019 1038   CREATININE 0.76 11/17/2015 1005   CALCIUM 9.3 05/13/2019 1038   PROT 6.7 05/13/2019 1038   ALBUMIN 3.2 (L) 05/13/2019 1038   AST 15 05/13/2019 1038   ALT 11 05/13/2019 1038   ALKPHOS 82 05/13/2019 1038    BILITOT 0.4 05/13/2019 1038   GFRNONAA >60 05/13/2019 1038   GFRAA >60 05/13/2019 1038    No results found for: SPEP, UPEP  Lab Results  Component Value Date   WBC 4.0 05/13/2019   NEUTROABS 1.4 (L) 05/13/2019   HGB 10.5 (L) 05/13/2019   HCT 33.0 (L) 05/13/2019   MCV 95.9 05/13/2019   PLT 155 05/13/2019      Chemistry      Component Value Date/Time   NA 142 05/13/2019 1038   K 3.6 05/13/2019 1038   CL 106 05/13/2019 1038   CO2 27 05/13/2019 1038   BUN 7 (L) 05/13/2019 1038   CREATININE 0.74 05/13/2019 1038   CREATININE 0.76 11/17/2015 1005      Component Value Date/Time   CALCIUM 9.3 05/13/2019 1038   ALKPHOS 82 05/13/2019  1038   AST 15 05/13/2019 1038   ALT 11 05/13/2019 1038   BILITOT 0.4 05/13/2019 1038

## 2019-05-14 ENCOUNTER — Telehealth: Payer: Self-pay | Admitting: Hematology and Oncology

## 2019-05-14 NOTE — Telephone Encounter (Signed)
Scheduled appts per 5/3 sch msg. Pt confirmed appt date and time.  

## 2019-05-21 NOTE — Progress Notes (Signed)
Pharmacist Chemotherapy Monitoring - Follow Up Assessment    I verify that I have reviewed each item in the below checklist:  . Regimen for the patient is scheduled for the appropriate day and plan matches scheduled date. Marland Kitchen Appropriate non-routine labs are ordered dependent on drug ordered. . If applicable, additional medications reviewed and ordered per protocol based on lifetime cumulative doses and/or treatment regimen.   Plan for follow-up and/or issues identified: No . I-vent associated with next due treatment: No . MD and/or nursing notified: No  Haley [name] 05/21/2019 11:49 AM

## 2019-05-27 ENCOUNTER — Inpatient Hospital Stay: Payer: PPO

## 2019-05-27 ENCOUNTER — Ambulatory Visit: Payer: PPO

## 2019-05-27 ENCOUNTER — Telehealth: Payer: Self-pay | Admitting: Oncology

## 2019-05-27 ENCOUNTER — Other Ambulatory Visit: Payer: PPO

## 2019-05-27 ENCOUNTER — Telehealth: Payer: Self-pay

## 2019-05-27 NOTE — Telephone Encounter (Signed)
Called Kirsta back and advised her of message below from Dr. Alvy Bimler.  Advised her that she can walk in to the [facility] or [place] locations or that she can call [facility] or [facility] to schedule appointments.  Advised her to call back once she has the appointment scheduled or once she knows when she is getting the vaccine.  She verbalized understanding and agreement.

## 2019-05-27 NOTE — Telephone Encounter (Signed)
Please give her info to call/schedule her vaccination. I encouraged everyone to get it All her future treatment will need to be rescheduled, let me know

## 2019-05-27 NOTE — Telephone Encounter (Signed)
Received voicemail from patient stating that her husband has been having symptoms of covid and has been tested but results not back yet. Called patient and let her know that since his results have not came back yet and neither one of them have been vaccinated that I would reschedule her appointments for today. I also let her know that if her husband's test does come back positive that she would need to be tested before returning back to the cancer center. Sent schedule message for patients appointments to be rescheduled.

## 2019-05-27 NOTE — Telephone Encounter (Signed)
Haley [name] called and said they just found out her husband tested negative for Covid.  Advised her that her appointments for today are being rescheduled. She is now wondering if she is ok to get the Covid vaccine.

## 2019-05-28 NOTE — Progress Notes (Signed)
Pharmacist Chemotherapy Monitoring - Follow Up Assessment    I verify that I have reviewed each item in the below checklist:  . Regimen for the patient is scheduled for the appropriate day and plan matches scheduled date. Marland Kitchen Appropriate non-routine labs are ordered dependent on drug ordered. . If applicable, additional medications reviewed and ordered per protocol based on lifetime cumulative doses and/or treatment regimen.   Plan for follow-up and/or issues identified: Yes . I-vent associated with next due treatment: Yes . MD and/or nursing notified: No   Kennith Center, Pharm.D., CPP 05/28/2019@3 :50 PM

## 2019-06-03 ENCOUNTER — Encounter: Payer: Self-pay | Admitting: Hematology and Oncology

## 2019-06-03 ENCOUNTER — Inpatient Hospital Stay: Payer: PPO | Admitting: Hematology and Oncology

## 2019-06-03 ENCOUNTER — Other Ambulatory Visit: Payer: Self-pay | Admitting: Hematology and Oncology

## 2019-06-03 ENCOUNTER — Inpatient Hospital Stay: Payer: PPO

## 2019-06-03 ENCOUNTER — Other Ambulatory Visit: Payer: Self-pay

## 2019-06-03 VITALS — BP 162/63 | HR 57 | Temp 97.9°F | Resp 18 | Ht 62.0 in | Wt 147.8 lb

## 2019-06-03 DIAGNOSIS — M79606 Pain in leg, unspecified: Secondary | ICD-10-CM

## 2019-06-03 DIAGNOSIS — Z7189 Other specified counseling: Secondary | ICD-10-CM

## 2019-06-03 DIAGNOSIS — Z5111 Encounter for antineoplastic chemotherapy: Secondary | ICD-10-CM | POA: Diagnosis not present

## 2019-06-03 DIAGNOSIS — D61818 Other pancytopenia: Secondary | ICD-10-CM | POA: Diagnosis not present

## 2019-06-03 DIAGNOSIS — C561 Malignant neoplasm of right ovary: Secondary | ICD-10-CM

## 2019-06-03 DIAGNOSIS — I10 Essential (primary) hypertension: Secondary | ICD-10-CM

## 2019-06-03 DIAGNOSIS — L989 Disorder of the skin and subcutaneous tissue, unspecified: Secondary | ICD-10-CM

## 2019-06-03 DIAGNOSIS — R590 Localized enlarged lymph nodes: Secondary | ICD-10-CM

## 2019-06-03 LAB — CMP (CANCER CENTER ONLY)
ALT: 9 U/L (ref 0–44)
AST: 13 U/L — ABNORMAL LOW (ref 15–41)
Albumin: 3.4 g/dL — ABNORMAL LOW (ref 3.5–5.0)
Alkaline Phosphatase: 65 U/L (ref 38–126)
Anion gap: 7 (ref 5–15)
BUN: 14 mg/dL (ref 8–23)
CO2: 27 mmol/L (ref 22–32)
Calcium: 8.9 mg/dL (ref 8.9–10.3)
Chloride: 107 mmol/L (ref 98–111)
Creatinine: 0.81 mg/dL (ref 0.44–1.00)
GFR, Est AFR Am: >60 mL/min (ref >60)
GFR, Estimated: >60 mL/min (ref >60)
Glucose, Bld: 113 mg/dL — ABNORMAL HIGH (ref 70–99)
Potassium: 3.8 mmol/L (ref 3.5–5.1)
Sodium: 141 mmol/L (ref 135–145)
Total Bilirubin: 0.4 mg/dL (ref 0.3–1.2)
Total Protein: 6.8 g/dL (ref 6.5–8.1)

## 2019-06-03 LAB — CBC WITH DIFFERENTIAL (CANCER CENTER ONLY)
Abs Immature Granulocytes: 0.05 10*3/uL (ref 0.00–0.07)
Basophils Absolute: 0.1 10*3/uL (ref 0.0–0.1)
Basophils Relative: 1 %
Eosinophils Absolute: 0.3 10*3/uL (ref 0.0–0.5)
Eosinophils Relative: 5 %
HCT: 35 % — ABNORMAL LOW (ref 36.0–46.0)
Hemoglobin: 10.9 g/dL — ABNORMAL LOW (ref 12.0–15.0)
Immature Granulocytes: 1 %
Lymphocytes Relative: 27 %
Lymphs Abs: 1.7 10*3/uL (ref 0.7–4.0)
MCH: 30.8 pg (ref 26.0–34.0)
MCHC: 31.1 g/dL (ref 30.0–36.0)
MCV: 98.9 fL (ref 80.0–100.0)
Monocytes Absolute: 0.7 10*3/uL (ref 0.1–1.0)
Monocytes Relative: 12 %
Neutro Abs: 3.3 10*3/uL (ref 1.7–7.7)
Neutrophils Relative %: 54 %
Platelet Count: 227 10*3/uL (ref 150–400)
RBC: 3.54 MIL/uL — ABNORMAL LOW (ref 3.87–5.11)
RDW: 18.5 % — ABNORMAL HIGH (ref 11.5–15.5)
WBC Count: 6.1 10*3/uL (ref 4.0–10.5)
nRBC: 0 % (ref 0.0–0.2)

## 2019-06-03 MED ORDER — SODIUM CHLORIDE 0.9% FLUSH
10.0000 mL | Freq: Once | INTRAVENOUS | Status: AC
  Administered 2019-06-03: 10 mL
  Filled 2019-06-03: qty 10

## 2019-06-03 MED ORDER — HEPARIN SOD (PORK) LOCK FLUSH 100 UNIT/ML IV SOLN
500.0000 [IU] | Freq: Once | INTRAVENOUS | Status: AC | PRN
  Administered 2019-06-03: 500 [IU]
  Filled 2019-06-03: qty 5

## 2019-06-03 MED ORDER — PROCHLORPERAZINE MALEATE 10 MG PO TABS
10.0000 mg | ORAL_TABLET | Freq: Once | ORAL | Status: AC
  Administered 2019-06-03: 10 mg via ORAL

## 2019-06-03 MED ORDER — LIDOCAINE-PRILOCAINE 2.5-2.5 % EX CREA: TOPICAL_CREAM | CUTANEOUS | 3 refills | Status: DC

## 2019-06-03 MED ORDER — SODIUM CHLORIDE 0.9% FLUSH
10.0000 mL | INTRAVENOUS | Status: DC | PRN
  Administered 2019-06-03: 10 mL
  Filled 2019-06-03: qty 10

## 2019-06-03 MED ORDER — SODIUM CHLORIDE 0.9 % IV SOLN
Freq: Once | INTRAVENOUS | Status: AC
  Filled 2019-06-03: qty 250

## 2019-06-03 MED ORDER — PROCHLORPERAZINE MALEATE 10 MG PO TABS
ORAL_TABLET | ORAL | Status: AC
  Filled 2019-06-03: qty 1

## 2019-06-03 MED ORDER — SODIUM CHLORIDE 0.9 % IV SOLN
600.0000 mg/m2 | Freq: Once | INTRAVENOUS | Status: AC
  Administered 2019-06-03: 1026 mg via INTRAVENOUS
  Filled 2019-06-03: qty 26.98

## 2019-06-03 NOTE — Progress Notes (Signed)
[facility] OFFICE PROGRESS NOTE  Patient Care Team: Martinique, Katja G, MD as PCP - General (Family Medicine) Troy Sine, MD as PCP - Cardiology (Cardiology) Awanda Mink Craige Cotta, RN as Oncology Nurse Navigator (Oncology) Eli Hose, [facility] as Pharmacist (Pharmacist)  ASSESSMENT & PLAN:  Right ovarian epithelial cancer [facility]) So far, she tolerated treatment well except for mild pancytopenia and occasional tiredness The palpable lymph node on the left axilla felt softer Overall, I think she has a positive response to treatment We will proceed with treatment without delay I recommend we proceed with imaging study next month before cycle 4 She is in agreement to proceed  Essential hypertension, benign She has intermittently elevated blood pressure but not symptomatic We will observe closely for now According to the patient, her blood pressure usually returns back to normal at home  Pancytopenia, acquired ([place]) This is due to treatment She is not symptomatic, observe only for now Due to chronic pancytopenia, her treatment cycle is on day 1 and 8 only, rest day 15 for cycle of every 21 days  Skin lesion of right lower extremity She is concerned about a skin lesion on the right leg I recommend dermatology review   Orders Placed This Encounter  Procedures  . CT CHEST W CONTRAST    Standing Status:   Future    Standing Expiration Date:   06/02/2020    Order Specific Question:   If indicated for the ordered procedure, I authorize the administration of contrast media per Radiology protocol    Answer:   Yes    Order Specific Question:   Preferred imaging location?    Answer:   [facility]    Order Specific Question:   Radiology Contrast Protocol - do NOT remove file path    Answer:   \\[email or web address]  . CT ABDOMEN PELVIS W CONTRAST    Standing Status:   Future    Standing Expiration Date:   06/02/2020    Order Specific Question:   If  indicated for the ordered procedure, I authorize the administration of contrast media per Radiology protocol    Answer:   Yes    Order Specific Question:   Preferred imaging location?    Answer:   [facility]    Order Specific Question:   Radiology Contrast Protocol - do NOT remove file path    Answer:   \\[email or web address]    All questions were answered. The patient knows to call the clinic with any problems, questions or concerns. The total time spent in the appointment was 20 minutes encounter with patients including review of chart and various tests results, discussions about plan of care and coordination of care plan   Heath Lark, MD 06/03/2019 9:56 AM  INTERVAL HISTORY: Please see below for problem oriented charting. She missed her treatment last week due to family not feeling well She is doing okay She has occasional nausea and bloating Denies constipation The lymph node in her axilla is stable and does not bother her No recent bleeding  SUMMARY OF ONCOLOGIC HISTORY: Oncology History Overview Note  Neg for genetics blood work and HRD High grade serous, recurrent biopsy proven PD-L1 5%   Right ovarian epithelial cancer ([place])  01/06/2018 Imaging   Ct abdomen and pelvis 1. Complex cystic mass at the right adnexa, measuring 5.9 x 4.0 cm, with nodular components, concerning for primary ovarian malignancy. 2. Diffuse nodularity along the omentum at the left side of the abdomen, extending  into the mesentery at the left mid abdomen, concerning for peritoneal carcinomatosis. 3. Wall thickening at the distal ileum adjacent to the ovarian mass; bowel loops appear somewhat adherent to the ovarian mass. Bowel infiltration with tumor cannot be excluded. No evidence of bowel obstruction at this time. 4. Small volume ascites within the abdomen and pelvis.  Aortic Atherosclerosis (ICD10-I70.0).   01/08/2018 Tumor Marker   Patient's tumor was tested for  the following markers: CA-125. Results of the tumor marker test revealed 3004   01/15/2018 Imaging   Chest CT:  1. No active cardiopulmonary disease. 2. Aortic atherosclerosis without aneurysm or dissection. 3. No large central pulmonary embolus.  CT AP:  1. Dilated fluid-filled loops of small bowel are redemonstrated slightly more extensive than on prior exam with transition point likely in the right adnexa adjacent to a complex cystic mass concerning for ovarian neoplasm given septations and soft tissue nodularity. This raises concern for early or partial SBO. This soft tissue mass measures 4.9 x 4 x 4.7 cm and has not changed since prior recent comparison. Additional short segmental area of luminal narrowing is noted in the right lower quadrant involving small bowel for which stigmata of peritoneal carcinomatosis or small-bowel metastatic implants might account for this. 2. Redemonstration of small volume of ascites predominantly in the upper abdomen surrounding the liver and spleen. 3. Redemonstration of thick bandlike omental thickening concerning for peritoneal carcinomatosis.    01/15/2018 Procedure   Successful ultrasound-guided diagnostic and therapeutic paracentesis yielding 1.5 liters of peritoneal fluid.    01/15/2018 Pathology Results   PERITONEAL/ASCITIC FLUID (SPECIMEN 1 OF 1 COLLECTED 01/18/18): MALIGNANT CELLS CONSISTENT WITH METASTATIC ADENOCARCINOMA. SEE COMMENT. COMMENT: THE MALIGNANT CELLS ARE POSITIVE FOR MOC-31, CYTOKERATIN 7, ESTROGEN RECEPTOR, PAX-8, AND WT-1. THEY ARE NEGATIVE FOR CALRETININ, CYTOKERATIN 5/6, AND CYTOKERATIN 20. THE PROFILE IS CONSISTENT WITH A PRIMARY GYNECOLOGIC CARCINOMA. THERE IS LIKELY SUFFICIENT TUMOR PRESENT, IF ADDITIONAL STUDIES ARE REQUESTED.   01/15/2018 - 02/02/2018 Hospital Admission   She was admitted to the hospital for SBO. She was treated with chemotherapy   01/18/2018 - 06/20/2018 Chemotherapy   The patient had carboplatin and  taxol   02/08/2018 Cancer Staging   Staging form: Ovary, Fallopian Tube, and Primary Peritoneal Carcinoma, AJCC 8th Edition - Clinical: cT3, cN0, cM0 - Signed by Heath Lark, MD on 02/08/2018   02/08/2018 Tumor Marker   Patient's tumor was tested for the following markers: CA-125. Results of the tumor marker test revealed 445   03/02/2018 Tumor Marker   Patient's tumor was tested for the following markers: CA-125. Results of the tumor marker test revealed 174   03/15/2018 Imaging   6.2 cm complex cystic right ovarian mass, compatible with malignant ovarian neoplasm, mildly progressive.  Associated peritoneal disease/omental caking beneath the anterior abdominal wall, mildly improved. Prior abdominal ascites is improved/resolved.  4.7 cm cystic left ovarian mass, without overt malignant features, grossly unchanged.  Mild bilateral hydroureteronephrosis, secondary to extrinsic compression, new.  Prior small bowel obstruction has improved/resolved.    03/27/2018 Pathology Results   1. Omentum, resection for tumor - OMENTUM: - HIGH GRADE SEROUS CARCINOMA. 2. Ovary, left - LEFT OVARY: - HIGH GRADE SEROUS CARCINOMA. - LEFT FALLOPIAN TUBE: - HIGH GRADE SEROUS CARCINOMA. 3. Adnexa - ovary +/- tube, neoplastic, right - RIGHT OVARY: - HIGH GRADE SEROUS CARCINOMA, 5.5 CM. - RIGHT FALLOPIAN TUBE: - HIGH GRADE SEROUS CARCINOMA. 4. Peritoneum, biopsy, nodule - PERITONEUM: - NODULES OF HIGH GRADE SEROUS CARCINOMA. 5. Peritoneum, resection for tumor, rectosigmoid -  RECTOSIGMOID PERITONEUM: - HIGH GRADE SEROUS CARCINOMA. Microscopic Comment 3. OVARY or FALLOPIAN TUBE or PRIMARY PERITONEUM: Procedure: Bilateral salpingo-oophorectomy, omentectomy and peritoneal biopsies. Specimen Integrity: N/A. Tumor Site: Right ovary. Ovarian Surface Involvement (required only if applicable): Yes. Fallopian Tube Surface Involvement (required only if applicable): Yes. Tumor Size: 5.5 x 4.8 x 4.0  cm. Histologic Type: Serous carcinoma. Histologic Grade: High grade. Implants (required for advanced stage serous/seromucinous borderline tumors only): Omentum, left ovary and fallopian tube, rectosigmoid peritoneum and peritoneum. Other Tissue/ Organ Involvement: Left ovary and fallopian tube, omentum and rectosigmoid peritoneum. Largest Extrapelvic Peritoneal Focus (required only if applicable): 45.6 cm, omentum. Peritoneal/Ascitic Fluid: N/A. Treatment Effect (required only for high-grade serous carcinomas): Minimal. Regional Lymph Nodes: No lymph nodes submitted. Pathologic Stage Classification (pTNM, AJCC 8th Edition): pT3c, pNX. Representative Tumor Block: 3A, 3B, 3D, 3E and 43F. Comment(s): There is a 5.5 cm in greatest dimension partially cystic high grade serous carcinoma involving the right adnexal specimen and the tumor is staged as a primary right ovarian carcinoma. The carcinoma also involves the left ovary as well as the left fallopian tube, the omentum, peritoneum and the rectosigmoid peritoneal specimens.   03/27/2018 Surgery   Preoperative Diagnosis: ovarian cancer, stage IIIC, metastatic to omentum, peritoneum, serosa of intestines   Procedure(s) Performed: 1. Exploratory laparotomy with bilateral salpingo-oophorectomy, omentectomy radical tumor debulking for ovarian cancer .  Surgeon: Thereasa Solo, MD.   Specimens: Bilateral tubes / ovaries, omentum. Peritoneal nodules, rectosigmoid nodules.    Operative Findings: omental cake, miliary studding on diaphragm and bilateral paracolic gutters. Sigmoid colon densely adherent to left and right ovaries with tumor rind, ureters mildly dilated bilaterally due to retroperitoneal extension of the tumor towards ureters causing compression.    This represented an optimal cytoreduction (R1) with gross residual disease on the diaphragm that had been ablated and a thin tumor plaque on the sigmoid colon that was ablated. No residual  disease >1cm remaining   05/07/2018 Genetic Testing   Negative genetic testing on the [employer] TumorNextHRD+ CancerNext Panel. The CancerNext gene panel offered by [employer] includes sequencing and rearrangement analysis for the following 34 genes:   APC, ATM, BARD1, BMPR1A, BRCA1, BRCA2, BRIP1, CDH1, CDK4, CDKN2A, CHEK2, DICER1, EPCAM, GREM1, HOXB13, MLH1, MRE11A, MSH2, MSH6, MUTYH, NBN, NF1, PALB2, PMS2, POLD1, POLE, PTEN, RAD50, RAD51C, RAD51D, SMAD4, SMARCA4, STK11, and TP53. Somatic genes analyzed through TumorNext-HRD: ATM, BARD1, BRCA1, BRCA2, BRIP1, CHEK2, MRE11A, NBN, PALB2, RAD51C, RAD51D. The report date is 05/07/2018.   05/11/2018 Tumor Marker   Patient's tumor was tested for the following markers: CA-125. Results of the tumor marker test revealed 60.5   07/02/2018 Imaging   1. Mild right pericardiophrenic adenopathy is mildly increased, suspicious for metastatic nodes. No abdominopelvic adenopathy. 2. Small left peritoneal soft tissue nodule adjacent to the splenic flexure and smooth left pelvic sidewall peritoneal thickening, cannot exclude residual/recurrent disease. Attention on follow-up CT advised. 3. Bilateral renal collecting system dilatation has improved. 4.  Aortic Atherosclerosis (ICD10-I70.0).   07/02/2018 Tumor Marker   Patient's tumor was tested for the following markers: CA-125 Results of the tumor marker test revealed 42.7   07/11/2018 Echocardiogram   1. The left ventricle has normal systolic function with an ejection fraction of 60-65%. The cavity size was normal. Left ventricular diastolic parameters were normal.  2. Normal GLS -20.1.  3. The right ventricle has normal systolic function. The cavity was normal. There is no increase in right ventricular wall thickness.  4. The mitral valve is degenerative.  Moderate thickening of the mitral valve leaflet. Moderate calcification of the mitral valve leaflet.  5. The aortic valve is tricuspid. Moderate thickening of the  aortic valve. Moderate calcification of the aortic valve. Aortic valve regurgitation is trivial by color flow Doppler.  6. The aortic root is normal in size and structure.    Chemotherapy   The patient had doxorubicin and bevacizumab for chemotherapy treatment.     07/30/2018 Tumor Marker   Patient's tumor was tested for the following markers: CA-125 Results of the tumor marker test revealed 82.6   08/27/2018 Tumor Marker   Patient's tumor was tested for the following markers: CA-125 Results of the tumor marker test revealed 111.   09/24/2018 Tumor Marker   Patient's tumor was tested for the following markers: CA-125 Results of the tumor marker test revealed 142.   10/05/2018 Imaging   1. Stable right juxta diaphragmatic/pericardiophrenic lymph node, mildly enlarged. 2. Otherwise no definite metastatic disease identified in the abdomen/pelvis. 3. 3 mm peritoneal nodule noted left paracolic gutter on today's study, indeterminate. Attention on follow-up recommended. 4.  Aortic Atherosclerois (ICD10-170.0)   10/16/2018 Echocardiogram    1. No significant change from prior study (07/11/2018).  2. Left ventricular ejection fraction, by visual estimation, is 60 to 65%. The left ventricle has normal function. Normal left ventricular size. There is no left ventricular hypertrophy.  3. LVEF by 3D assessment 63%.  4. The average left ventricular global longitudinal strain is -22.4 %.  5. Global right ventricle has normal systolic function.The right ventricular size is normal. No increase in right ventricular wall thickness.  6. Left atrial size was normal.  7. Right atrial size was normal.  8. Presence of pericardial fat pad.  9. Moderate thickening of the mitral valve leaflet(s). 10. The mitral valve is normal in structure. Trace mitral valve regurgitation. 11. The tricuspid valve is normal in structure. Tricuspid valve regurgitation is mild. 12. Aortic regurgitation PHT measures 467 msec. 13.  The aortic valve is tricuspid Aortic valve regurgitation is mild by color flow Doppler. Mild aortic valve sclerosis without stenosis. 14. There is mild to moderate calcification of the AoV, with focal calcification of the [place]. The aortic regurgitation is mild in severity, likely related to degenerative valve disease. 15. The pulmonic valve was grossly normal. Pulmonic valve regurgitation is not visualized by color flow Doppler. 16. Mild plaque invoving the ascending aorta. 17. Normal pulmonary artery systolic pressure. 18. The tricuspid regurgitant velocity is 2.52 m/s, and with an assumed right atrial pressure of 3 mmHg, the estimated right ventricular systolic pressure is normal at 28.4 mmHg.   10/26/2018 Tumor Marker   Patient's tumor was tested for the following markers: CA-125 Results of the tumor marker test revealed 196   12/11/2018 Imaging   1. No substantial interval change in exam. No definite findings to suggest recurrent/metastatic disease. 2. Right pericardial phrenic lymph node noted on multiple prior studies is unchanged in the interval. 3. 8 mm omental nodule identified on today's exam. Close attention on follow-up recommended. 3 mm soft tissue nodule along the left paracolic gutter seen previously has resolved in the interval. 4.  Aortic Atherosclerois (ICD10-170.0)   04/02/2019 Imaging   1. Interval development of left axillary and subpectoral lymphadenopathy is highly concerning for metastatic disease, however, this would be a highly unusual pattern of metastatic spread for an ovarian primary neoplasm. This is most concerning for potential left-sided breast cancer. Correlation with mammography is strongly recommended. 2. No other signs of definite  metastatic disease noted elsewhere in the chest, abdomen or pelvis. 3. Aortic atherosclerosis. 4. Additional incidental findings, similar to prior studies, as above.     04/03/2019 Pathology Results   Immunohistochemistry shows the  carcinoma is positive with cytokeratin AE1/AE3, cytokeratin 7, PAX-8, WT-1, and shows patchy positivity with estrogen receptor. The carcinoma is negative with GATA3, GCDFP, p53, Napsin A and TTF-1. The immunophenotype is most consistent with metastatic high grade serous carcinoma. (JDP:ah 04/04/19)FINAL DIAGNOSIS Diagnosis Lymph node, needle/core biopsy, left axilla - METASTATIC CARCINOMA. - SEE MICROSCOPIC DESCRIPT    Genetic Testing   Patient has genetic testing done for PD-L1. Results revealed patient has the following: PD-L1 staining in tumor cells (TC): 3% PD-L1 staining in tumor-associated immune cells (IC): 10% PD-L1 combined positive score (CPS): 5%   04/15/2019 -  Chemotherapy   The patient had gemzar for chemotherapy treatment.       REVIEW OF SYSTEMS:   Constitutional: Denies fevers, chills or abnormal weight loss Eyes: Denies blurriness of vision Ears, nose, mouth, throat, and face: Denies mucositis or sore throat Respiratory: Denies cough, dyspnea or wheezes Cardiovascular: Denies palpitation, chest discomfort or lower extremity swelling Lymphatics: Denies new lymphadenopathy or easy bruising Neurological:Denies numbness, tingling or new weaknesses Behavioral/Psych: Mood is stable, no new changes  All other systems were reviewed with the patient and are negative.  I have reviewed the past medical history, past surgical history, social history and family history with the patient and they are unchanged from previous note.  ALLERGIES:  is allergic to tramadol hcl.  MEDICATIONS:  Current Outpatient Medications  Medication Sig Dispense Refill  . acetaminophen (TYLENOL) 500 MG tablet Take 2 tablets (1,000 mg total) by mouth every 6 (six) hours. (Patient taking differently: Take 1,000 mg by mouth every 6 (six) hours as needed. ) 30 tablet 0  . ALPRAZolam (XANAX) 0.5 MG tablet TAKE 1/2 TO 1 (ONE-HALF TO ONE) TABLET BY MOUTH ONCE DAILY AS NEEDED FOR ANXIETY 30 tablet 2  . alum  & mag hydroxide-simeth (MAALOX/MYLANTA) 200-200-20 MG/5ML suspension Take 30 mLs by mouth every 6 (six) hours as needed for indigestion or heartburn. 355 mL 0  . Artificial Saliva (SALIVASURE) LOZG Use as directed 1 lozenge in the mouth or throat every 4 (four) hours as needed. 90 lozenge 0  . Calcium Carbonate (CALTRATE 600) 1500 MG TABS Take 600 mg of elemental calcium by mouth daily.     . cholecalciferol (VITAMIN D) 1000 UNITS tablet Take 1,000 Units by mouth daily.     Marland Kitchen estradiol (ESTRACE) 0.1 MG/GM vaginal cream Place 1 Applicatorful vaginally 3 (three) times a week. 42.5 g 12  . fluticasone (FLONASE) 50 MCG/ACT nasal spray Place 2 sprays into both nostrils daily. 48 g 2  . gabapentin (NEURONTIN) 300 MG capsule Take 1 capsule (300 mg total) by mouth 2 (two) times daily. 60 capsule 11  . HYDROmorphone (DILAUDID) 2 MG tablet Take 1 tablet (2 mg total) by mouth every 4 (four) hours as needed for severe pain. 30 tablet 0  . lactose free nutrition (BOOST PLUS) LIQD Take 237 mLs by mouth daily. (Patient taking differently: Take 237 mLs by mouth 2 (two) times daily between meals. ) 10 Can 0  . levothyroxine (SYNTHROID) 75 MCG tablet Take 1 tablet (75 mcg total) by mouth daily. 90 tablet 3  . lidocaine-prilocaine (EMLA) cream Apply to affected area once 30 g 3  . Magnesium 400 MG TABS Take 250 mg by mouth daily.    . Menthol-Methyl Salicylate (  MUSCLE RUB) 10-15 % CREA Apply 1 application topically as needed for muscle pain.    . metoprolol succinate (TOPROL-XL) 50 MG 24 hr tablet Take 1 tablet (50 mg total) by mouth daily. Take with or immediately following a meal. 90 tablet 2  . Multiple Vitamins-Minerals (MULTIVITAMIN WITH MINERALS) tablet Take 1 tablet by mouth daily.      . Omega-3 Fatty Acids (FISH OIL) 1000 MG CAPS Take 2,000 mg by mouth 2 (two) times daily.     Marland Kitchen omeprazole (PRILOSEC) 40 MG capsule Take 1 capsule (40 mg total) by mouth daily. 90 capsule 3  . ondansetron (ZOFRAN) 8 MG tablet  Take 1 tablet (8 mg total) by mouth every 8 (eight) hours as needed (Nausea or vomiting). 30 tablet 1  . polyethylene glycol (MIRALAX / GLYCOLAX) packet Take 17 g by mouth daily. 14 each 0  . prochlorperazine (COMPAZINE) 10 MG tablet Take 1 tablet (10 mg total) by mouth every 6 (six) hours as needed (Nausea or vomiting). 30 tablet 1  . senna (SENOKOT) 8.6 MG TABS tablet Take 1 tablet (8.6 mg total) by mouth at bedtime as needed for mild constipation. 120 each 0  . simvastatin (ZOCOR) 20 MG tablet Take 1 tablet (20 mg total) by mouth at bedtime. 90 tablet 1  . vitamin C (ASCORBIC ACID) 500 MG tablet Take 1,000 mg by mouth daily.    [name] 20 MG TABS tablet TAKE 1 TABLET BY MOUTH ONCE DAILY WITH  SUPPER 90 tablet 1   No current facility-administered medications for this visit.    PHYSICAL EXAMINATION: ECOG PERFORMANCE STATUS: 1 - Symptomatic but completely ambulatory  Vitals:   06/03/19 0944  BP: (!) 162/63  Pulse: (!) 57  Resp: 18  Temp: 97.9 F (36.6 C)  SpO2: 100%   Filed Weights   06/03/19 0944  Weight: 147 lb 12.8 oz (67 kg)    GENERAL:alert, no distress and comfortable SKIN: Noted a skin lesion on the right calf, indeterminate etiology EYES: normal, Conjunctiva are pink and non-injected, sclera clear OROPHARYNX:no exudate, no erythema and lips, buccal mucosa, and tongue normal  NECK: supple, thyroid normal size, non-tender, without nodularity LYMPH: The left axillary lymph node is stable in size  LUNGS: clear to auscultation and percussion with normal breathing effort HEART: regular rate & rhythm and no murmurs and no lower extremity edema ABDOMEN:abdomen soft, non-tender and normal bowel sounds Musculoskeletal:no cyanosis of digits and no clubbing  NEURO: alert & oriented x 3 with fluent speech, no focal motor/sensory deficits  LABORATORY DATA:  I have reviewed the data as listed    Component Value Date/Time   NA 142 05/13/2019 1038   K 3.6 05/13/2019 1038   CL  106 05/13/2019 1038   CO2 27 05/13/2019 1038   GLUCOSE 90 05/13/2019 1038   BUN 7 (L) 05/13/2019 1038   CREATININE 0.74 05/13/2019 1038   CREATININE 0.76 11/17/2015 1005   CALCIUM 9.3 05/13/2019 1038   PROT 6.7 05/13/2019 1038   ALBUMIN 3.2 (L) 05/13/2019 1038   AST 15 05/13/2019 1038   ALT 11 05/13/2019 1038   ALKPHOS 82 05/13/2019 1038   BILITOT 0.4 05/13/2019 1038   GFRNONAA >60 05/13/2019 1038   GFRAA >60 05/13/2019 1038    No results found for: SPEP, UPEP  Lab Results  Component Value Date   WBC 6.1 06/03/2019   NEUTROABS 3.3 06/03/2019   HGB 10.9 (L) 06/03/2019   HCT 35.0 (L) 06/03/2019   MCV 98.9 06/03/2019  PLT 227 06/03/2019      Chemistry      Component Value Date/Time   NA 142 05/13/2019 1038   K 3.6 05/13/2019 1038   CL 106 05/13/2019 1038   CO2 27 05/13/2019 1038   BUN 7 (L) 05/13/2019 1038   CREATININE 0.74 05/13/2019 1038   CREATININE 0.76 11/17/2015 1005      Component Value Date/Time   CALCIUM 9.3 05/13/2019 1038   ALKPHOS 82 05/13/2019 1038   AST 15 05/13/2019 1038   ALT 11 05/13/2019 1038   BILITOT 0.4 05/13/2019 1038

## 2019-06-03 NOTE — Assessment & Plan Note (Signed)
She is concerned about a skin lesion on the right leg I recommend dermatology review

## 2019-06-03 NOTE — Assessment & Plan Note (Signed)
This is due to treatment She is not symptomatic, observe only for now Due to chronic pancytopenia, her treatment cycle is on day 1 and 8 only, rest day 15 for cycle of every 21 days

## 2019-06-03 NOTE — Assessment & Plan Note (Signed)
So far, she tolerated treatment well except for mild pancytopenia and occasional tiredness The palpable lymph node on the left axilla felt softer Overall, I think she has a positive response to treatment We will proceed with treatment without delay I recommend we proceed with imaging study next month before cycle 4 She is in agreement to proceed

## 2019-06-03 NOTE — Patient Instructions (Signed)
[facility] °Discharge Instructions for Patients Receiving Chemotherapy ° °Today you received the following chemotherapy agents Gemzar ° °To help prevent nausea and vomiting after your treatment, we encourage you to take your nausea medication as directed. °  °If you develop nausea and vomiting that is not controlled by your nausea medication, call the clinic.  ° °BELOW ARE SYMPTOMS THAT SHOULD BE REPORTED IMMEDIATELY: °· *FEVER GREATER THAN 100.5 F °· *CHILLS WITH OR WITHOUT FEVER °· NAUSEA AND VOMITING THAT IS NOT CONTROLLED WITH YOUR NAUSEA MEDICATION °· *UNUSUAL SHORTNESS OF BREATH °· *UNUSUAL BRUISING OR BLEEDING °· TENDERNESS IN MOUTH AND THROAT WITH OR WITHOUT PRESENCE OF ULCERS °· *URINARY PROBLEMS °· *BOWEL PROBLEMS °· UNUSUAL RASH °Items with * indicate a potential emergency and should be followed up as soon as possible. ° °Feel free to call the clinic should you have any questions or concerns. The clinic phone number is [phone]. ° °Please show the CHEMO ALERT CARD at check-in to the Emergency Department and triage nurse. ° ° °

## 2019-06-03 NOTE — Assessment & Plan Note (Signed)
She has intermittently elevated blood pressure but not symptomatic We will observe closely for now According to the patient, her blood pressure usually returns back to normal at home

## 2019-06-04 ENCOUNTER — Telehealth: Payer: Self-pay | Admitting: Hematology and Oncology

## 2019-06-04 NOTE — Telephone Encounter (Signed)
Scheduled per 5/24 sch message. Pt aware of appts. Noted to give pt updated calendar on next visit.

## 2019-06-06 NOTE — Progress Notes (Signed)
Pharmacist Chemotherapy Monitoring - Follow Up Assessment    I verify that I have reviewed each item in the below checklist:   Regimen for the patient is scheduled for the appropriate day and plan matches scheduled date.  Appropriate non-routine labs are ordered dependent on drug ordered.  If applicable, additional medications reviewed and ordered per protocol based on lifetime cumulative doses and/or treatment regimen.   Plan for follow-up and/or issues identified: No  I-vent associated with next due treatment: No  MD and/or nursing notified: No     Roseanne Juenger D 06/06/2019 3:36 PM

## 2019-06-13 ENCOUNTER — Other Ambulatory Visit: Payer: Self-pay | Admitting: Hematology and Oncology

## 2019-06-13 ENCOUNTER — Inpatient Hospital Stay: Payer: PPO | Attending: Obstetrics

## 2019-06-13 ENCOUNTER — Other Ambulatory Visit: Payer: Self-pay

## 2019-06-13 ENCOUNTER — Inpatient Hospital Stay: Payer: PPO

## 2019-06-13 VITALS — BP 154/68 | HR 80 | Temp 98.4°F | Resp 18

## 2019-06-13 DIAGNOSIS — C561 Malignant neoplasm of right ovary: Secondary | ICD-10-CM

## 2019-06-13 DIAGNOSIS — Z7189 Other specified counseling: Secondary | ICD-10-CM

## 2019-06-13 DIAGNOSIS — M79606 Pain in leg, unspecified: Secondary | ICD-10-CM

## 2019-06-13 DIAGNOSIS — Z9221 Personal history of antineoplastic chemotherapy: Secondary | ICD-10-CM | POA: Insufficient documentation

## 2019-06-13 DIAGNOSIS — C786 Secondary malignant neoplasm of retroperitoneum and peritoneum: Secondary | ICD-10-CM | POA: Insufficient documentation

## 2019-06-13 DIAGNOSIS — R971 Elevated cancer antigen 125 [CA 125]: Secondary | ICD-10-CM | POA: Insufficient documentation

## 2019-06-13 DIAGNOSIS — Z5111 Encounter for antineoplastic chemotherapy: Secondary | ICD-10-CM | POA: Diagnosis not present

## 2019-06-13 DIAGNOSIS — C773 Secondary and unspecified malignant neoplasm of axilla and upper limb lymph nodes: Secondary | ICD-10-CM | POA: Insufficient documentation

## 2019-06-13 LAB — CBC WITH DIFFERENTIAL (CANCER CENTER ONLY)
Abs Immature Granulocytes: 0.09 10*3/uL — ABNORMAL HIGH (ref 0.00–0.07)
Basophils Absolute: 0.1 10*3/uL (ref 0.0–0.1)
Basophils Relative: 1 %
Eosinophils Absolute: 0.4 10*3/uL (ref 0.0–0.5)
Eosinophils Relative: 5 %
HCT: 32.1 % — ABNORMAL LOW (ref 36.0–46.0)
Hemoglobin: 10.5 g/dL — ABNORMAL LOW (ref 12.0–15.0)
Immature Granulocytes: 1 %
Lymphocytes Relative: 28 %
Lymphs Abs: 1.8 10*3/uL (ref 0.7–4.0)
MCH: 32.1 pg (ref 26.0–34.0)
MCHC: 32.7 g/dL (ref 30.0–36.0)
MCV: 98.2 fL (ref 80.0–100.0)
Monocytes Absolute: 0.8 10*3/uL (ref 0.1–1.0)
Monocytes Relative: 12 %
Neutro Abs: 3.5 10*3/uL (ref 1.7–7.7)
Neutrophils Relative %: 53 %
Platelet Count: 89 10*3/uL — ABNORMAL LOW (ref 150–400)
RBC: 3.27 MIL/uL — ABNORMAL LOW (ref 3.87–5.11)
RDW: 18.6 % — ABNORMAL HIGH (ref 11.5–15.5)
WBC Count: 6.5 10*3/uL (ref 4.0–10.5)
nRBC: 0 % (ref 0.0–0.2)

## 2019-06-13 LAB — CMP (CANCER CENTER ONLY)
ALT: 10 U/L (ref 0–44)
AST: 15 U/L (ref 15–41)
Albumin: 3.3 g/dL — ABNORMAL LOW (ref 3.5–5.0)
Alkaline Phosphatase: 80 U/L (ref 38–126)
Anion gap: 8 (ref 5–15)
BUN: 11 mg/dL (ref 8–23)
CO2: 25 mmol/L (ref 22–32)
Calcium: 9.2 mg/dL (ref 8.9–10.3)
Chloride: 108 mmol/L (ref 98–111)
Creatinine: 0.81 mg/dL (ref 0.44–1.00)
GFR, Est AFR Am: >60 mL/min (ref >60)
GFR, Estimated: >60 mL/min (ref >60)
Glucose, Bld: 96 mg/dL (ref 70–99)
Potassium: 4 mmol/L (ref 3.5–5.1)
Sodium: 141 mmol/L (ref 135–145)
Total Bilirubin: 0.3 mg/dL (ref 0.3–1.2)
Total Protein: 6.3 g/dL — ABNORMAL LOW (ref 6.5–8.1)

## 2019-06-13 MED ORDER — SODIUM CHLORIDE 0.9 % IV SOLN
Freq: Once | INTRAVENOUS | Status: AC
  Filled 2019-06-13: qty 250

## 2019-06-13 MED ORDER — SODIUM CHLORIDE 0.9 % IV SOLN
480.0000 mg/m2 | Freq: Once | INTRAVENOUS | Status: AC
  Administered 2019-06-13: 836 mg via INTRAVENOUS
  Filled 2019-06-13: qty 21.99

## 2019-06-13 MED ORDER — SODIUM CHLORIDE 0.9% FLUSH
10.0000 mL | Freq: Once | INTRAVENOUS | Status: AC
  Administered 2019-06-13: 10 mL
  Filled 2019-06-13: qty 10

## 2019-06-13 MED ORDER — PROCHLORPERAZINE MALEATE 10 MG PO TABS
ORAL_TABLET | ORAL | Status: AC
  Filled 2019-06-13: qty 1

## 2019-06-13 MED ORDER — PROCHLORPERAZINE MALEATE 10 MG PO TABS
10.0000 mg | ORAL_TABLET | Freq: Once | ORAL | Status: AC
  Administered 2019-06-13: 10 mg via ORAL

## 2019-06-13 MED ORDER — HEPARIN SOD (PORK) LOCK FLUSH 100 UNIT/ML IV SOLN
500.0000 [IU] | Freq: Once | INTRAVENOUS | Status: AC | PRN
  Administered 2019-06-13: 500 [IU]
  Filled 2019-06-13: qty 5

## 2019-06-13 MED ORDER — SODIUM CHLORIDE 0.9% FLUSH
10.0000 mL | INTRAVENOUS | Status: DC | PRN
  Filled 2019-06-13: qty 10

## 2019-06-13 NOTE — Progress Notes (Signed)
Per Dr. Alvy Bimler, okay to treat today with low PLT.

## 2019-06-13 NOTE — Patient Instructions (Signed)
[facility] °Discharge Instructions for Patients Receiving Chemotherapy ° °Today you received the following chemotherapy agents Gemzar ° °To help prevent nausea and vomiting after your treatment, we encourage you to take your nausea medication as directed. °  °If you develop nausea and vomiting that is not controlled by your nausea medication, call the clinic.  ° °BELOW ARE SYMPTOMS THAT SHOULD BE REPORTED IMMEDIATELY: °· *FEVER GREATER THAN 100.5 F °· *CHILLS WITH OR WITHOUT FEVER °· NAUSEA AND VOMITING THAT IS NOT CONTROLLED WITH YOUR NAUSEA MEDICATION °· *UNUSUAL SHORTNESS OF BREATH °· *UNUSUAL BRUISING OR BLEEDING °· TENDERNESS IN MOUTH AND THROAT WITH OR WITHOUT PRESENCE OF ULCERS °· *URINARY PROBLEMS °· *BOWEL PROBLEMS °· UNUSUAL RASH °Items with * indicate a potential emergency and should be followed up as soon as possible. ° °Feel free to call the clinic should you have any questions or concerns. The clinic phone number is [phone]. ° °Please show the CHEMO ALERT CARD at check-in to the Emergency Department and triage nurse. ° ° °

## 2019-06-14 ENCOUNTER — Telehealth: Payer: Self-pay | Admitting: Physician Assistant

## 2019-06-14 NOTE — Telephone Encounter (Signed)
Called to discuss the homebound Covid-19 vaccination initiative with the patient and/or caregiver.   Pt is interested in homebound vaccination but only wants the pfizer vaccine. At this time we are only offering Moderna. Will call back if that changes.   Angelena Form PA-C  [facility]

## 2019-06-17 ENCOUNTER — Encounter: Payer: Self-pay | Admitting: Hematology and Oncology

## 2019-06-18 ENCOUNTER — Encounter: Payer: Self-pay | Admitting: Hematology and Oncology

## 2019-06-18 ENCOUNTER — Encounter: Payer: Self-pay | Admitting: Family Medicine

## 2019-06-19 ENCOUNTER — Other Ambulatory Visit: Payer: Self-pay | Admitting: *Deleted

## 2019-06-19 MED ORDER — OMEPRAZOLE 40 MG PO CPDR: 40.0000 mg | DELAYED_RELEASE_CAPSULE | Freq: Every day | ORAL | 3 refills | Status: AC

## 2019-06-21 ENCOUNTER — Other Ambulatory Visit: Payer: Self-pay

## 2019-06-21 ENCOUNTER — Encounter (HOSPITAL_COMMUNITY): Payer: Self-pay

## 2019-06-21 ENCOUNTER — Ambulatory Visit (HOSPITAL_COMMUNITY)
Admission: RE | Admit: 2019-06-21 | Discharge: 2019-06-21 | Disposition: A | Discharge status: Home / Self Care | Payer: PPO | Source: Ambulatory Visit | Attending: Hematology and Oncology | Admitting: Hematology and Oncology

## 2019-06-21 DIAGNOSIS — C561 Malignant neoplasm of right ovary: Secondary | ICD-10-CM | POA: Diagnosis not present

## 2019-06-21 DIAGNOSIS — C569 Malignant neoplasm of unspecified ovary: Secondary | ICD-10-CM | POA: Diagnosis not present

## 2019-06-21 DIAGNOSIS — R590 Localized enlarged lymph nodes: Secondary | ICD-10-CM | POA: Diagnosis not present

## 2019-06-21 DIAGNOSIS — R918 Other nonspecific abnormal finding of lung field: Secondary | ICD-10-CM | POA: Diagnosis not present

## 2019-06-21 MED ORDER — SODIUM CHLORIDE (PF) 0.9 % IJ SOLN
INTRAMUSCULAR | Status: AC
  Filled 2019-06-21: qty 50

## 2019-06-21 MED ORDER — HEPARIN SOD (PORK) LOCK FLUSH 100 UNIT/ML IV SOLN
INTRAVENOUS | Status: AC
  Filled 2019-06-21: qty 5

## 2019-06-21 MED ORDER — HEPARIN SOD (PORK) LOCK FLUSH 100 UNIT/ML IV SOLN
500.0000 [IU] | Freq: Once | INTRAVENOUS | Status: AC
  Administered 2019-06-21: 500 [IU] via INTRAVENOUS

## 2019-06-21 MED ORDER — IOHEXOL 300 MG/ML  SOLN
100.0000 mL | Freq: Once | INTRAMUSCULAR | Status: AC | PRN
  Administered 2019-06-21: 100 mL via INTRAVENOUS

## 2019-06-24 ENCOUNTER — Inpatient Hospital Stay: Payer: PPO

## 2019-06-24 ENCOUNTER — Inpatient Hospital Stay: Payer: PPO | Admitting: Hematology and Oncology

## 2019-06-24 ENCOUNTER — Other Ambulatory Visit: Payer: Self-pay

## 2019-06-24 DIAGNOSIS — Z5111 Encounter for antineoplastic chemotherapy: Secondary | ICD-10-CM | POA: Diagnosis not present

## 2019-06-24 DIAGNOSIS — Z7189 Other specified counseling: Secondary | ICD-10-CM

## 2019-06-24 DIAGNOSIS — C561 Malignant neoplasm of right ovary: Secondary | ICD-10-CM | POA: Diagnosis not present

## 2019-06-25 ENCOUNTER — Encounter: Payer: Self-pay | Admitting: *Deleted

## 2019-06-25 ENCOUNTER — Other Ambulatory Visit: Payer: Self-pay

## 2019-06-25 ENCOUNTER — Encounter: Payer: Self-pay | Admitting: Hematology and Oncology

## 2019-06-25 ENCOUNTER — Encounter: Payer: Self-pay | Admitting: Gynecologic Oncology

## 2019-06-25 ENCOUNTER — Inpatient Hospital Stay (HOSPITAL_BASED_OUTPATIENT_CLINIC_OR_DEPARTMENT_OTHER): Payer: PPO | Admitting: Gynecologic Oncology

## 2019-06-25 VITALS — BP 176/73 | HR 66 | Temp 98.2°F | Resp 16 | Ht 62.0 in | Wt 146.1 lb

## 2019-06-25 DIAGNOSIS — Z9221 Personal history of antineoplastic chemotherapy: Secondary | ICD-10-CM | POA: Diagnosis not present

## 2019-06-25 DIAGNOSIS — C773 Secondary and unspecified malignant neoplasm of axilla and upper limb lymph nodes: Secondary | ICD-10-CM | POA: Diagnosis not present

## 2019-06-25 DIAGNOSIS — R971 Elevated cancer antigen 125 [CA 125]: Secondary | ICD-10-CM | POA: Diagnosis not present

## 2019-06-25 DIAGNOSIS — C786 Secondary malignant neoplasm of retroperitoneum and peritoneum: Secondary | ICD-10-CM

## 2019-06-25 DIAGNOSIS — C561 Malignant neoplasm of right ovary: Secondary | ICD-10-CM

## 2019-06-25 DIAGNOSIS — Z5111 Encounter for antineoplastic chemotherapy: Secondary | ICD-10-CM | POA: Diagnosis not present

## 2019-06-25 DIAGNOSIS — Z515 Encounter for palliative care: Secondary | ICD-10-CM | POA: Insufficient documentation

## 2019-06-25 NOTE — Progress Notes (Signed)
Follow-up Note: Gyn-Onc CC:  Chief Complaint  Patient presents with  . Right ovarian epithelial cancer ([place])    Est patient    Assessment/Plan:  [name]  is a 74 y.o.  year old with recurrent, progressive, platinum resistant stage IIIC ovarian cancer (BRCA negative), s/p 3 cycles carboplatin and paclitaxel, S/p interval optimal cytoreduction on 03/27/18 and an additional 3 cycles of adjuvant carboplatin/paclitaxel chemotherapy completed in June, 2020.   Recurrence/progression in July, 2020. Progression in March, 2020. Disease predominantly in the retroperitoneum (axillary, chest, retroperitoneal nodes).   Haley [name] was counseled and offered 4th line salvage therapy but declined due to futility and her current state of good QOL (her cancer is asymptomatic). She is accepting of the anticipated outcome (death from cancer in July 24, 2022 months most likely).   We discussed that I will see her every 3 months for surveillance during this palliative care/hospice phase of management. We will discuss with her hospice programs where she lives and introduce her to these so that their services can be readily introduced when her symptoms progress.   We discussed DNR. She has a living will and would only want resuscitation if there was a reasonable chance of recovery.  At this time with her relatively asymptomatic, albeit advanced, disease, she continues to be full code.  We will revisit this with each encounter, as she understands that there will come a time when her cancer is progressing and she is no longer a reasonable candidate for resuscitation.  HPI:    [name] is a 74 year old woman who was seen in consultation at the request of Dr Phineas Real in January, 2020 (initially by Dr Precious Haws) for apparent stage IIIC ovarian cancer. At that time she had urinary incontinence about 1 year. This started worsening in Sept 2019. She saw urology and was told she was "backed up" in her words. She  was given a medication for the incontinence and associates the use of this medication with the start of constipation. Thinking it was the medication she tried to change her diet to help. When the second week came around she started noticing back pain. By the 3rd week this was fairly severe. After discussing with her PCP she was started on Miralax and colace and mag citrate x 1. This helped with her BM but she was starting to feel swollen in her abdomen.   On 01/06/18 she felt significant pain and cramps and due to pain went to [facility]. Imaging revealed a pelvic mass and carcinomatosis concerning for ovarian cancer.   01/06/18 CT notes: IMPRESSION: 1. Complex cystic mass at the right adnexa, measuring 5.9 x 4.0 cm, with nodular components, concerning for primary ovarian malignancy. 2. Diffuse nodularity along the omentum at the left side of the abdomen, extending into the mesentery at the left mid abdomen, concerning for peritoneal carcinomatosis. 3. Wall thickening at the distal ileum adjacent to the ovarian mass; bowel loops appear somewhat adherent to the ovarian mass. Bowel infiltration with tumor cannot be excluded. No evidence of bowel obstruction at this time. 4. Small volume ascites within the abdomen and pelvis.  She had some nausea/emesis and significant difficulty eating.   She followed up with Dr.Fontaine, who upon reviewing her case encouraged her to followup here with gynecologic oncology, also out of concern for ovarian cancer. CA125 was drawn with Dr. Phineas Real and is quite elevated at 3004  Due to her apparent small bowel involvement with obstruction, she was treated with  neoadjuvant chemotherapy for 3 cycles (starting on 01/18/18).  She returns to the office for follow-up having now undergone 3 cycles of neoadjuvant chemotherapy. She is feeling much better and her bowels have normalized.  She had an initial paracentesis but did not need a repeat. Cytology from that showed  01/15/18 PERITONEAL/ASCITIC FLUID (SPECIMEN 1 OF 1 COLLECTED 01/18/18): MALIGNANT CELLS CONSISTENT WITH METASTATIC ADENOCARCINOMA. SEE COMMENT. COMMENT: THE MALIGNANT CELLS ARE POSITIVE FOR MOC-31, CYTOKERATIN 7, ESTROGEN RECEPTOR, PAX-8, AND WT-1. THEY ARE NEGATIVE FOR CALRETININ, CYTOKERATIN 5/6, AND CYTOKERATIN 20. THE PROFILE IS CONSISTENT WITH A PRIMARY GYNECOLOGIC CARCINOMA. THERE IS LIKELY SUFFICIENT TUMOR PRESENT, IF ADDITIONAL STUDIES ARE REQUESTED.  Noted some arthralgias but overall feels well.   CA 125 had improved at the time of cycle 3 and was 174.   CT abd/pelvis on March 15, 2018 showed bilateral adnexal masses remained in situ measuring 6.2 on the right previously 4.9 cm and 4.7 cm on the left previously 5.3 cm.  The ascites had resolved.  The omental caking was mildly improved.  There was no suspicious lymphadenopathy.  There was mild bilateral hydronephrosis that was new felt to be secondary to extrinsic compression from the adnexal masses.  No new lesions were seen.  Interval Hx:  On 03/27/18 she underwent exploratory laparotomy, BSO, omentectomy radical tumor debulking. Intraoperative findings were significant for omental caking, peritoneal nodules, diaphragmatic nodularity, nodularity on sigmoid colon. At the completion of the procedure there was <1cm disease at any location representing R1 optimal cytoreduction. She did well postoperatively with no specific complications.   She then went on to complete 3 additional cycles of carboplatin paclitaxel adjuvant chemotherapy completed in June 2020.  Ca1 25 performed on July 02, 2018 6:20 cycle was 42.7.  Genetic testing on May 07, 2018 was negative for BRCA mutations or other gene mutations.  It was felt that she had persistent disease and was treated with doxorubicin and bevacizumab as platinum resistant progressive ovarian cancer between July 16, 2018 and December 03, 2018.  Her CA-125 continues to rise on this regimen, elevating  to the level of 196 in October 2020.  Imaging at that time revealed no significant change from her July study.  They continue to be right pericardiophrenic lymph node that was unchanged.  There is an 8 mm omental nodule identified.  She was off chemotherapy between November 2020 and March 2021.  Imaging at that time confirmed interval development of left axillary and subpectoral lymphadenopathy concerning for metastatic disease.  The patient palpated this adenopathy herself.  This was biopsied on April 03, 2019 confirming recurrent disease.  Immunostains and histologic profile were consistent with high-grade serous carcinoma.  She commenced salvage chemotherapy with Gemzar on April 15, 2019 and continue this until June 13, 2019.  Repeat imaging on June 21, 2019 confirmed bulky left axillary adenopathy greater than the right adenopathy.  There was also developing nodularity in lymph nodes along the left hemidiaphragm and pericardial fat.  There was stable small lymph nodes throughout the retroperitoneum.  There was no obvious carcinomatosis or intraperitoneal disease.  This scan confirmed progression of disease and she was taken off Gemzar chemotherapy.  Discussion at that time was made with the patient regarding fourth line salvage therapy and the lower likelihood of response and the lack of possibility of cure of her platinum resistant disease.  Given that she was somewhat asymptomatic from her cancer that she elected for no additional treatment at that time to maximize quality of life and avoid  futile therapies.  Haley [name] return to me today to discuss options, prognosis, and end-of-life treatment planning.   Current Meds:  Outpatient Encounter Medications as of 06/25/2019  Medication Sig  . acetaminophen (TYLENOL) 500 MG tablet Take 2 tablets (1,000 mg total) by mouth every 6 (six) hours. (Patient taking differently: Take 1,000 mg by mouth every 6 (six) hours as needed. )  . ALPRAZolam (XANAX) 0.5 MG  tablet TAKE 1/2 TO 1 (ONE-HALF TO ONE) TABLET BY MOUTH ONCE DAILY AS NEEDED FOR ANXIETY  . alum & mag hydroxide-simeth (MAALOX/MYLANTA) 200-200-20 MG/5ML suspension Take 30 mLs by mouth every 6 (six) hours as needed for indigestion or heartburn.  . Artificial Saliva (SALIVASURE) LOZG Use as directed 1 lozenge in the mouth or throat every 4 (four) hours as needed.  . Calcium Carbonate (CALTRATE 600) 1500 MG TABS Take 600 mg of elemental calcium by mouth daily.   . cholecalciferol (VITAMIN D) 1000 UNITS tablet Take 1,000 Units by mouth daily.   Marland Kitchen estradiol (ESTRACE) 0.1 MG/GM vaginal cream Place 1 Applicatorful vaginally 3 (three) times a week.  . fluticasone (CUTIVATE) 0.05 % cream APPLY CREAM TOPICALLY UP TO TWICE DAILY AS NEEDED FOR RASH  . fluticasone (FLONASE) 50 MCG/ACT nasal spray Place 2 sprays into both nostrils daily.  Marland Kitchen gabapentin (NEURONTIN) 300 MG capsule Take 1 capsule (300 mg total) by mouth 2 (two) times daily.  Marland Kitchen HYDROmorphone (DILAUDID) 2 MG tablet Take 1 tablet (2 mg total) by mouth every 4 (four) hours as needed for severe pain.  Marland Kitchen lactose free nutrition (BOOST PLUS) LIQD Take 237 mLs by mouth daily. (Patient taking differently: Take 237 mLs by mouth 2 (two) times daily between meals. )  . levothyroxine (SYNTHROID) 75 MCG tablet Take 1 tablet (75 mcg total) by mouth daily.  Marland Kitchen lidocaine-prilocaine (EMLA) cream Apply to affected area once  . Magnesium 400 MG TABS Take 250 mg by mouth daily.  . Menthol-Methyl Salicylate (MUSCLE RUB) 10-15 % CREA Apply 1 application topically as needed for muscle pain.  . metoprolol succinate (TOPROL-XL) 50 MG 24 hr tablet Take 1 tablet (50 mg total) by mouth daily. Take with or immediately following a meal.  . Multiple Vitamins-Minerals (MULTIVITAMIN WITH MINERALS) tablet Take 1 tablet by mouth daily.    . Omega-3 Fatty Acids (FISH OIL) 1000 MG CAPS Take 2,000 mg by mouth 2 (two) times daily.   Marland Kitchen omeprazole (PRILOSEC) 40 MG capsule Take 1 capsule (40  mg total) by mouth daily.  . ondansetron (ZOFRAN) 8 MG tablet Take 1 tablet (8 mg total) by mouth every 8 (eight) hours as needed (Nausea or vomiting).  . polyethylene glycol (MIRALAX / GLYCOLAX) packet Take 17 g by mouth daily.  . prochlorperazine (COMPAZINE) 10 MG tablet Take 1 tablet (10 mg total) by mouth every 6 (six) hours as needed (Nausea or vomiting).  Marland Kitchen senna (SENOKOT) 8.6 MG TABS tablet Take 1 tablet (8.6 mg total) by mouth at bedtime as needed for mild constipation.  . simvastatin (ZOCOR) 20 MG tablet Take 1 tablet (20 mg total) by mouth at bedtime.  . tretinoin (RETIN-A) 0.025 % cream APPLY TOPICALLY TO FACE AT BEDTIME AS NEEDED  . vitamin C (ASCORBIC ACID) 500 MG tablet Take 1,000 mg by mouth daily.  [name] 20 MG TABS tablet TAKE 1 TABLET BY MOUTH ONCE DAILY WITH  SUPPER   No facility-administered encounter medications on file as of 06/25/2019.    Allergy:  Allergies  Allergen Reactions  . Tramadol Hcl  jittery    Social Hx:   Social History   Socioeconomic History  . Marital status: Married    Spouse name: Not on file  . Number of children: Not on file  . Years of education: Not on file  . Highest education level: Not on file  Occupational History  . Not on file  Tobacco Use  . Smoking status: Light Tobacco Smoker    Years: 10.00    Types: Cigars  . Smokeless tobacco: Never Used  . Tobacco comment: not daily  [place] cigars 1-2   Vaping Use  . Vaping Use: Never used  Substance and Sexual Activity  . Alcohol use: No    Alcohol/week: 0.0 standard drinks  . Drug use: No  . Sexual activity: Yes    Comment: 1st intercourse 17yo-1 partner  Other Topics Concern  . Not on file  Social History Narrative  . Not on file   Social Determinants of Health   Financial Resource Strain:   . Difficulty of Paying Living Expenses:   Food Insecurity:   . Worried About Charity fundraiser in the Last Year:   . Arboriculturist in the Last Year:   Transportation  Needs:   . Film/video editor (Medical):   Marland Kitchen Lack of Transportation (Non-Medical):   Physical Activity:   . Days of Exercise per Week:   . Minutes of Exercise per Session:   Stress:   . Feeling of Stress :   Social Connections:   . Frequency of Communication with Friends and Family:   . Frequency of Social Gatherings with Friends and Family:   . Attends Religious Services:   . Active Member of Clubs or Organizations:   . Attends Archivist Meetings:   Marland Kitchen Marital Status:   Intimate Partner Violence:   . Fear of Current or Ex-Partner:   . Emotionally Abused:   Marland Kitchen Physically Abused:   . Sexually Abused:     Past Surgical Hx:  Past Surgical History:  Procedure Laterality Date  . ABDOMINAL HYSTERECTOMY  1989  . APPENDECTOMY  1989   with hysterectomy  . BILATERAL SALPINGECTOMY N/A 03/27/2018   Procedure: EXPLORATORY LAPARATOMY, BILATERAL SALPINGOOPHERECTOMY;  Surgeon: Everitt Amber, MD;  Location: [facility];  Service: Gynecology;  Laterality: N/A;  . BREAST EXCISIONAL BIOPSY Left 1995   Benign  . COLONOSCOPY  11-20-2000  . DEBULKING N/A 03/27/2018   Procedure: RADICAL TUMOR DEBULKING;  Surgeon: Everitt Amber, MD;  Location: [facility];  Service: Gynecology;  Laterality: N/A;  . IR IMAGING GUIDED PORT INSERTION  01/17/2018  . NOSE SURGERY  1990  . OMENTECTOMY N/A 03/27/2018   Procedure: OMENTECTOMY;  Surgeon: Everitt Amber, MD;  Location: [facility];  Service: Gynecology;  Laterality: N/A;    Past Medical Hx:  Past Medical History:  Diagnosis Date  . Allergy    SEASONAL  . Anxiety   . Cataract    BILATERAL  . Dysrhythmia   . Family history of lung cancer   . Family history of prostate cancer   . Family history of prostate cancer   . Family history of uterine cancer   . Fibromyalgia   . GERD (gastroesophageal reflux disease)   . H/O echocardiogram 04/28/08   EF>55% trace mitral regurgitation, No significant valvular pathology  . Hemorrhoids    internal  . History of stress  test 02/08/2011   Normal Myocardial perfusion study, this is a low risk scan, No prior study available for comparison  .  Hyperlipidemia   . Hypertension    hx of  . Hypothyroidism   . MVP (mitral valve prolapse)    mild and MR  . OA (osteoarthritis)   . Osteoporosis   . ovarian ca dx'd 12/2017   ovarian cancer  . Paroxysmal A-fib ([place])   . Sleep apnea    does not use prescribed CPAP  does not tolerate  . Thyroid disease    HYPO  . Varicosities of leg   . Vitamin D deficiency     Past Gynecological History:  Abdominal hysterectomy for benign desease. No LMP recorded. Patient has had a hysterectomy.  Family Hx:  Family History  Problem Relation Age of Onset  . Diabetes Daughter   . Diabetes Mother   . Hypertension Mother   . Heart disease Mother   . Stroke Maternal Grandmother   . Lung cancer Niece   . Cancer Paternal Aunt        type of cancer unk  . Prostate cancer Paternal Uncle   . Uterine cancer Cousin        dx under 50  . Colon cancer Neg Hx     Review of Systems:  Constitutional  Feels fatigued   ENT Normal appearing ears and nares bilaterally Skin/Breast  No rash, sores, jaundice, itching, dryness Cardiovascular  No chest pain, shortness of breath, or edema  Pulmonary  No cough or wheeze.  Gastro Intestinal  No nausea, vomitting, or diarrhoea. No bright red blood per rectum, no abdominal pain, change in bowel movement, or constipation.  Genito Urinary  No frequency, urgency, dysuria, no bleeding Musculo Skeletal  No myalgia, arthralgia, joint swelling or pain  Neurologic  No weakness, numbness, change in gait,  Psychology  No depression, anxiety, insomnia.   Vitals:  Blood pressure (!) 176/73, pulse 66, temperature 98.2 F (36.8 C), temperature source Temporal, resp. rate 16, height _0  (1.575 m), weight 146 lb 1.6 oz (66.3 kg), SpO2 99 %.  Physical Exam: WD in NAD   30 minutes of direct face to face counseling time was spent with the  patient. This included discussion about prognosis, therapy recommendations and end of life counseling.   Thereasa Solo, MD  06/25/2019, 1:32 PM

## 2019-06-25 NOTE — Patient Instructions (Signed)
Dr Denman George will follow-up with you in 3 months to see how your cancer symptoms are progressing.

## 2019-06-25 NOTE — Assessment & Plan Note (Signed)
The patient has platinum refractory disease Her cancer relapse soon after discontinuation of adjuvant carboplatin and Taxol She had numerous side effects with Doxil and Avastin.  Treatment had to be discontinued due to rising tumor marker and progression of disease. Subsequently, we started her on gemcitabine which she tolerated better but CT imaging show evidence of progression of disease I discussed the poor prognosis nature of her clinical condition I did not go through the list of multiple other fourth line agents Certainly, topotecan, Taxotere or even pembrolizumab can be attempted by I estimated benefits to be low We discussed potential discontinuation of treatment She would like to obtain second opinion by her GYN surgeon and I will set her up

## 2019-06-25 NOTE — Assessment & Plan Note (Signed)
I have numerous goals of care discussions with the patient and family We discussed the risk and benefits of further treatment versus focusing on quality of life and palliative care The patient does not want to know about prognosis I discussed her prognosis in private, with her daughter and her husband I estimated her prognosis to be less than 6 months I will see her back after her appointment with her surgeon for further discussion about plan of care If she elected for further treatment, I will bring her back to discuss treatment options If she elected for palliative care, I would discontinue follow-up and refer her to home-based palliative care/hospice

## 2019-06-25 NOTE — Progress Notes (Signed)
[facility] OFFICE PROGRESS NOTE  Patient Care Team: [place], Luka G, MD as PCP - General (Family Medicine) Lennette Bihari, MD as PCP - Cardiology (Cardiology) Paulina Fusi Servando Snare, RN as Oncology Nurse Navigator (Oncology) Prudence Davidson, [facility] as Pharmacist (Pharmacist)  ASSESSMENT & PLAN:  Right ovarian epithelial cancer [facility]) The patient has platinum refractory disease Her cancer relapse soon after discontinuation of adjuvant carboplatin and Taxol She had numerous side effects with Doxil and Avastin.  Treatment had to be discontinued due to rising tumor marker and progression of disease. Subsequently, we started her on gemcitabine which she tolerated better but CT imaging show evidence of progression of disease I discussed the poor prognosis nature of her clinical condition I did not go through the list of multiple other fourth line agents Certainly, topotecan, Taxotere or even pembrolizumab can be attempted by I estimated benefits to be low We discussed potential discontinuation of treatment She would like to obtain second opinion by her GYN surgeon and I will set her up  Goals of care, counseling/discussion I have numerous goals of care discussions with the patient and family We discussed the risk and benefits of further treatment versus focusing on quality of life and palliative care The patient does not want to know about prognosis I discussed her prognosis in private, with her daughter and her husband I estimated her prognosis to be less than 6 months I will see her back after her appointment with her surgeon for further discussion about plan of care If she elected for further treatment, I will bring her back to discuss treatment options If she elected for palliative care, I would discontinue follow-up and refer her to home-based palliative care/hospice   No orders of the defined types were placed in this encounter.   All questions were answered. The patient knows  to call the clinic with any problems, questions or concerns. The total time spent in the appointment was 55 minutes encounter with patients including review of chart and various tests results, discussions about plan of care and coordination of care plan   Artis Delay, MD 06/25/2019 9:48 AM  INTERVAL HISTORY: Please see below for problem oriented charting. She returns with her husband and daughter for further follow-up Since last time I saw her, she felt that the lymph node in her axilla is enlarging She denies nausea or different side effects from treatment so far The patient and family are all very tearful to know that CT imaging show progression of disease After about half an hour of discussion, the patient went to the bathroom I discussed prognosis in private with her daughter and her husband Ultimately, the patient is referred back to GYN surgeon for second opinion and further discussion about plan of care  SUMMARY OF ONCOLOGIC HISTORY: Oncology History Overview Note  Neg for genetics blood work and HRD High grade serous, recurrent biopsy proven PD-L1 5%  Relapsed within a few months of finishing carbo/taxol, minimal response to Doxil/Avastin, progressed on Gemzar   Right ovarian epithelial cancer (HCC)  01/06/2018 Imaging   Ct abdomen and pelvis 1. Complex cystic mass at the right adnexa, measuring 5.9 x 4.0 cm, with nodular components, concerning for primary ovarian malignancy. 2. Diffuse nodularity along the omentum at the left side of the abdomen, extending into the mesentery at the left mid abdomen, concerning for peritoneal carcinomatosis. 3. Wall thickening at the distal ileum adjacent to the ovarian mass; bowel loops appear somewhat adherent to the ovarian mass. Bowel  infiltration with tumor cannot be excluded. No evidence of bowel obstruction at this time. 4. Small volume ascites within the abdomen and pelvis.  Aortic Atherosclerosis (ICD10-I70.0).   01/08/2018 Tumor  Marker   Patient's tumor was tested for the following markers: CA-125. Results of the tumor marker test revealed 3004   01/15/2018 Imaging   Chest CT:  1. No active cardiopulmonary disease. 2. Aortic atherosclerosis without aneurysm or dissection. 3. No large central pulmonary embolus.  CT AP:  1. Dilated fluid-filled loops of small bowel are redemonstrated slightly more extensive than on prior exam with transition point likely in the right adnexa adjacent to a complex cystic mass concerning for ovarian neoplasm given septations and soft tissue nodularity. This raises concern for early or partial SBO. This soft tissue mass measures 4.9 x 4 x 4.7 cm and has not changed since prior recent comparison. Additional short segmental area of luminal narrowing is noted in the right lower quadrant involving small bowel for which stigmata of peritoneal carcinomatosis or small-bowel metastatic implants might account for this. 2. Redemonstration of small volume of ascites predominantly in the upper abdomen surrounding the liver and spleen. 3. Redemonstration of thick bandlike omental thickening concerning for peritoneal carcinomatosis.    01/15/2018 Procedure   Successful ultrasound-guided diagnostic and therapeutic paracentesis yielding 1.5 liters of peritoneal fluid.    01/15/2018 Pathology Results   PERITONEAL/ASCITIC FLUID (SPECIMEN 1 OF 1 COLLECTED 01/18/18): MALIGNANT CELLS CONSISTENT WITH METASTATIC ADENOCARCINOMA. SEE COMMENT. COMMENT: THE MALIGNANT CELLS ARE POSITIVE FOR MOC-31, CYTOKERATIN 7, ESTROGEN RECEPTOR, PAX-8, AND WT-1. THEY ARE NEGATIVE FOR CALRETININ, CYTOKERATIN 5/6, AND CYTOKERATIN 20. THE PROFILE IS CONSISTENT WITH A PRIMARY GYNECOLOGIC CARCINOMA. THERE IS LIKELY SUFFICIENT TUMOR PRESENT, IF ADDITIONAL STUDIES ARE REQUESTED.   01/15/2018 - 02/02/2018 Hospital Admission   She was admitted to the hospital for SBO. She was treated with chemotherapy   01/18/2018 - 06/20/2018 Chemotherapy    The patient had carboplatin and taxol   02/08/2018 Cancer Staging   Staging form: Ovary, Fallopian Tube, and Primary Peritoneal Carcinoma, AJCC 8th Edition - Clinical: cT3, cN0, cM0 - Signed by Heath Lark, MD on 02/08/2018   02/08/2018 Tumor Marker   Patient's tumor was tested for the following markers: CA-125. Results of the tumor marker test revealed 445   03/02/2018 Tumor Marker   Patient's tumor was tested for the following markers: CA-125. Results of the tumor marker test revealed 174   03/15/2018 Imaging   6.2 cm complex cystic right ovarian mass, compatible with malignant ovarian neoplasm, mildly progressive.  Associated peritoneal disease/omental caking beneath the anterior abdominal wall, mildly improved. Prior abdominal ascites is improved/resolved.  4.7 cm cystic left ovarian mass, without overt malignant features, grossly unchanged.  Mild bilateral hydroureteronephrosis, secondary to extrinsic compression, new.  Prior small bowel obstruction has improved/resolved.    03/27/2018 Pathology Results   1. Omentum, resection for tumor - OMENTUM: - HIGH GRADE SEROUS CARCINOMA. 2. Ovary, left - LEFT OVARY: - HIGH GRADE SEROUS CARCINOMA. - LEFT FALLOPIAN TUBE: - HIGH GRADE SEROUS CARCINOMA. 3. Adnexa - ovary +/- tube, neoplastic, right - RIGHT OVARY: - HIGH GRADE SEROUS CARCINOMA, 5.5 CM. - RIGHT FALLOPIAN TUBE: - HIGH GRADE SEROUS CARCINOMA. 4. Peritoneum, biopsy, nodule - PERITONEUM: - NODULES OF HIGH GRADE SEROUS CARCINOMA. 5. Peritoneum, resection for tumor, rectosigmoid - RECTOSIGMOID PERITONEUM: - HIGH GRADE SEROUS CARCINOMA. Microscopic Comment 3. OVARY or FALLOPIAN TUBE or PRIMARY PERITONEUM: Procedure: Bilateral salpingo-oophorectomy, omentectomy and peritoneal biopsies. Specimen Integrity: N/A. Tumor Site: Right ovary. Ovarian Surface  Involvement (required only if applicable): Yes. Fallopian Tube Surface Involvement (required only if applicable):  Yes. Tumor Size: 5.5 x 4.8 x 4.0 cm. Histologic Type: Serous carcinoma. Histologic Grade: High grade. Implants (required for advanced stage serous/seromucinous borderline tumors only): Omentum, left ovary and fallopian tube, rectosigmoid peritoneum and peritoneum. Other Tissue/ Organ Involvement: Left ovary and fallopian tube, omentum and rectosigmoid peritoneum. Largest Extrapelvic Peritoneal Focus (required only if applicable): 32.3 cm, omentum. Peritoneal/Ascitic Fluid: N/A. Treatment Effect (required only for high-grade serous carcinomas): Minimal. Regional Lymph Nodes: No lymph nodes submitted. Pathologic Stage Classification (pTNM, AJCC 8th Edition): pT3c, pNX. Representative Tumor Block: 3A, 3B, 3D, 3E and 60F. Comment(s): There is a 5.5 cm in greatest dimension partially cystic high grade serous carcinoma involving the right adnexal specimen and the tumor is staged as a primary right ovarian carcinoma. The carcinoma also involves the left ovary as well as the left fallopian tube, the omentum, peritoneum and the rectosigmoid peritoneal specimens.   03/27/2018 Surgery   Preoperative Diagnosis: ovarian cancer, stage IIIC, metastatic to omentum, peritoneum, serosa of intestines   Procedure(s) Performed: 1. Exploratory laparotomy with bilateral salpingo-oophorectomy, omentectomy radical tumor debulking for ovarian cancer .  Surgeon: Thereasa Solo, MD.   Specimens: Bilateral tubes / ovaries, omentum. Peritoneal nodules, rectosigmoid nodules.    Operative Findings: omental cake, miliary studding on diaphragm and bilateral paracolic gutters. Sigmoid colon densely adherent to left and right ovaries with tumor rind, ureters mildly dilated bilaterally due to retroperitoneal extension of the tumor towards ureters causing compression.    This represented an optimal cytoreduction (R1) with gross residual disease on the diaphragm that had been ablated and a thin tumor plaque on the sigmoid colon  that was ablated. No residual disease >1cm remaining   05/07/2018 Genetic Testing   Negative genetic testing on the [employer] TumorNextHRD+ CancerNext Panel. The CancerNext gene panel offered by [employer] includes sequencing and rearrangement analysis for the following 34 genes:   APC, ATM, BARD1, BMPR1A, BRCA1, BRCA2, BRIP1, CDH1, CDK4, CDKN2A, CHEK2, DICER1, EPCAM, GREM1, HOXB13, MLH1, MRE11A, MSH2, MSH6, MUTYH, NBN, NF1, PALB2, PMS2, POLD1, POLE, PTEN, RAD50, RAD51C, RAD51D, SMAD4, SMARCA4, STK11, and TP53. Somatic genes analyzed through TumorNext-HRD: ATM, BARD1, BRCA1, BRCA2, BRIP1, CHEK2, MRE11A, NBN, PALB2, RAD51C, RAD51D. The report date is 05/07/2018.   05/11/2018 Tumor Marker   Patient's tumor was tested for the following markers: CA-125. Results of the tumor marker test revealed 60.5   07/02/2018 Imaging   1. Mild right pericardiophrenic adenopathy is mildly increased, suspicious for metastatic nodes. No abdominopelvic adenopathy. 2. Small left peritoneal soft tissue nodule adjacent to the splenic flexure and smooth left pelvic sidewall peritoneal thickening, cannot exclude residual/recurrent disease. Attention on follow-up CT advised. 3. Bilateral renal collecting system dilatation has improved. 4.  Aortic Atherosclerosis (ICD10-I70.0).   07/02/2018 Tumor Marker   Patient's tumor was tested for the following markers: CA-125 Results of the tumor marker test revealed 42.7   07/11/2018 Echocardiogram   1. The left ventricle has normal systolic function with an ejection fraction of 60-65%. The cavity size was normal. Left ventricular diastolic parameters were normal.  2. Normal GLS -20.1.  3. The right ventricle has normal systolic function. The cavity was normal. There is no increase in right ventricular wall thickness.  4. The mitral valve is degenerative. Moderate thickening of the mitral valve leaflet. Moderate calcification of the mitral valve leaflet.  5. The aortic valve is tricuspid.  Moderate thickening of the aortic valve. Moderate calcification of the aortic  valve. Aortic valve regurgitation is trivial by color flow Doppler.  6. The aortic root is normal in size and structure.   07/16/2018 - 12/03/2018 Chemotherapy   The patient had doxorubicin and bevacizumab for chemotherapy treatment.     07/30/2018 Tumor Marker   Patient's tumor was tested for the following markers: CA-125 Results of the tumor marker test revealed 82.6   08/27/2018 Tumor Marker   Patient's tumor was tested for the following markers: CA-125 Results of the tumor marker test revealed 111.   09/24/2018 Tumor Marker   Patient's tumor was tested for the following markers: CA-125 Results of the tumor marker test revealed 142.   10/05/2018 Imaging   1. Stable right juxta diaphragmatic/pericardiophrenic lymph node, mildly enlarged. 2. Otherwise no definite metastatic disease identified in the abdomen/pelvis. 3. 3 mm peritoneal nodule noted left paracolic gutter on today's study, indeterminate. Attention on follow-up recommended. 4.  Aortic Atherosclerois (ICD10-170.0)   10/16/2018 Echocardiogram    1. No significant change from prior study (07/11/2018).  2. Left ventricular ejection fraction, by visual estimation, is 60 to 65%. The left ventricle has normal function. Normal left ventricular size. There is no left ventricular hypertrophy.  3. LVEF by 3D assessment 63%.  4. The average left ventricular global longitudinal strain is -22.4 %.  5. Global right ventricle has normal systolic function.The right ventricular size is normal. No increase in right ventricular wall thickness.  6. Left atrial size was normal.  7. Right atrial size was normal.  8. Presence of pericardial fat pad.  9. Moderate thickening of the mitral valve leaflet(s). 10. The mitral valve is normal in structure. Trace mitral valve regurgitation. 11. The tricuspid valve is normal in structure. Tricuspid valve regurgitation is mild. 12.  Aortic regurgitation PHT measures 467 msec. 13. The aortic valve is tricuspid Aortic valve regurgitation is mild by color flow Doppler. Mild aortic valve sclerosis without stenosis. 14. There is mild to moderate calcification of the AoV, with focal calcification of the [place]. The aortic regurgitation is mild in severity, likely related to degenerative valve disease. 15. The pulmonic valve was grossly normal. Pulmonic valve regurgitation is not visualized by color flow Doppler. 16. Mild plaque invoving the ascending aorta. 17. Normal pulmonary artery systolic pressure. 18. The tricuspid regurgitant velocity is 2.52 m/s, and with an assumed right atrial pressure of 3 mmHg, the estimated right ventricular systolic pressure is normal at 28.4 mmHg.   10/26/2018 Tumor Marker   Patient's tumor was tested for the following markers: CA-125 Results of the tumor marker test revealed 196   12/11/2018 Imaging   1. No substantial interval change in exam. No definite findings to suggest recurrent/metastatic disease. 2. Right pericardial phrenic lymph node noted on multiple prior studies is unchanged in the interval. 3. 8 mm omental nodule identified on today's exam. Close attention on follow-up recommended. 3 mm soft tissue nodule along the left paracolic gutter seen previously has resolved in the interval. 4.  Aortic Atherosclerois (ICD10-170.0)   04/02/2019 Imaging   1. Interval development of left axillary and subpectoral lymphadenopathy is highly concerning for metastatic disease, however, this would be a highly unusual pattern of metastatic spread for an ovarian primary neoplasm. This is most concerning for potential left-sided breast cancer. Correlation with mammography is strongly recommended. 2. No other signs of definite metastatic disease noted elsewhere in the chest, abdomen or pelvis. 3. Aortic atherosclerosis. 4. Additional incidental findings, similar to prior studies, as above.     04/03/2019  Pathology Results  Immunohistochemistry shows the carcinoma is positive with cytokeratin AE1/AE3, cytokeratin 7, PAX-8, WT-1, and shows patchy positivity with estrogen receptor. The carcinoma is negative with GATA3, GCDFP, p53, Napsin A and TTF-1. The immunophenotype is most consistent with metastatic high grade serous carcinoma. (JDP:ah 04/04/19)FINAL DIAGNOSIS Diagnosis Lymph node, needle/core biopsy, left axilla - METASTATIC CARCINOMA. - SEE MICROSCOPIC DESCRIPT    Genetic Testing   Patient has genetic testing done for PD-L1. Results revealed patient has the following: PD-L1 staining in tumor cells (TC): 3% PD-L1 staining in tumor-associated immune cells (IC): 10% PD-L1 combined positive score (CPS): 5%   04/15/2019 - 06/13/2019 Chemotherapy   The patient had gemzar for chemotherapy treatment.     06/21/2019 Imaging   1. Bulky LEFT axillary adenopathy greater than RIGHT axillary adenopathy increasing in size. 2. Developing nodularity and or small lymph nodes along the LEFT hemidiaphragm in the inferior pre pericardial fat. Attention on follow-up. 3. Stable small lymph nodes throughout the retroperitoneum. 4. Stable low-density hepatic lesions compatible with cysts 5. Aortic atherosclerosis.   Aortic Atherosclerosis (ICD10-I70.0).       REVIEW OF SYSTEMS:   Constitutional: Denies fevers, chills or abnormal weight loss Eyes: Denies blurriness of vision Ears, nose, mouth, throat, and face: Denies mucositis or sore throat Respiratory: Denies cough, dyspnea or wheezes Cardiovascular: Denies palpitation, chest discomfort or lower extremity swelling Gastrointestinal:  Denies nausea, heartburn or change in bowel habits Skin: Denies abnormal skin rashes Lymphatics: Denies new lymphadenopathy or easy bruising Neurological:Denies numbness, tingling or new weaknesses Behavioral/Psych: Mood is stable, no new changes  All other systems were reviewed with the patient and are negative.  I  have reviewed the past medical history, past surgical history, social history and family history with the patient and they are unchanged from previous note.  ALLERGIES:  is allergic to tramadol hcl.  MEDICATIONS:  Current Outpatient Medications  Medication Sig Dispense Refill  . acetaminophen (TYLENOL) 500 MG tablet Take 2 tablets (1,000 mg total) by mouth every 6 (six) hours. (Patient taking differently: Take 1,000 mg by mouth every 6 (six) hours as needed. ) 30 tablet 0  . ALPRAZolam (XANAX) 0.5 MG tablet TAKE 1/2 TO 1 (ONE-HALF TO ONE) TABLET BY MOUTH ONCE DAILY AS NEEDED FOR ANXIETY 30 tablet 2  . alum & mag hydroxide-simeth (MAALOX/MYLANTA) 200-200-20 MG/5ML suspension Take 30 mLs by mouth every 6 (six) hours as needed for indigestion or heartburn. 355 mL 0  . Artificial Saliva (SALIVASURE) LOZG Use as directed 1 lozenge in the mouth or throat every 4 (four) hours as needed. 90 lozenge 0  . Calcium Carbonate (CALTRATE 600) 1500 MG TABS Take 600 mg of elemental calcium by mouth daily.     . cholecalciferol (VITAMIN D) 1000 UNITS tablet Take 1,000 Units by mouth daily.     Marland Kitchen estradiol (ESTRACE) 0.1 MG/GM vaginal cream Place 1 Applicatorful vaginally 3 (three) times a week. 42.5 g 12  . fluticasone (CUTIVATE) 0.05 % cream APPLY CREAM TOPICALLY UP TO TWICE DAILY AS NEEDED FOR RASH    . fluticasone (FLONASE) 50 MCG/ACT nasal spray Place 2 sprays into both nostrils daily. 48 g 2  . gabapentin (NEURONTIN) 300 MG capsule Take 1 capsule (300 mg total) by mouth 2 (two) times daily. 60 capsule 11  . HYDROmorphone (DILAUDID) 2 MG tablet Take 1 tablet (2 mg total) by mouth every 4 (four) hours as needed for severe pain. 30 tablet 0  . lactose free nutrition (BOOST PLUS) LIQD Take 237 mLs by mouth daily. (  Patient taking differently: Take 237 mLs by mouth 2 (two) times daily between meals. ) 10 Can 0  . levothyroxine (SYNTHROID) 75 MCG tablet Take 1 tablet (75 mcg total) by mouth daily. 90 tablet 3  .  lidocaine-prilocaine (EMLA) cream Apply to affected area once 30 g 3  . Magnesium 400 MG TABS Take 250 mg by mouth daily.    . Menthol-Methyl Salicylate (MUSCLE RUB) 10-15 % CREA Apply 1 application topically as needed for muscle pain.    . metoprolol succinate (TOPROL-XL) 50 MG 24 hr tablet Take 1 tablet (50 mg total) by mouth daily. Take with or immediately following a meal. 90 tablet 2  . Multiple Vitamins-Minerals (MULTIVITAMIN WITH MINERALS) tablet Take 1 tablet by mouth daily.      . Omega-3 Fatty Acids (FISH OIL) 1000 MG CAPS Take 2,000 mg by mouth 2 (two) times daily.     Marland Kitchen omeprazole (PRILOSEC) 40 MG capsule Take 1 capsule (40 mg total) by mouth daily. 90 capsule 3  . ondansetron (ZOFRAN) 8 MG tablet Take 1 tablet (8 mg total) by mouth every 8 (eight) hours as needed (Nausea or vomiting). 30 tablet 1  . polyethylene glycol (MIRALAX / GLYCOLAX) packet Take 17 g by mouth daily. 14 each 0  . prochlorperazine (COMPAZINE) 10 MG tablet Take 1 tablet (10 mg total) by mouth every 6 (six) hours as needed (Nausea or vomiting). 30 tablet 1  . senna (SENOKOT) 8.6 MG TABS tablet Take 1 tablet (8.6 mg total) by mouth at bedtime as needed for mild constipation. 120 each 0  . simvastatin (ZOCOR) 20 MG tablet Take 1 tablet (20 mg total) by mouth at bedtime. 90 tablet 1  . tretinoin (RETIN-A) 0.025 % cream APPLY TOPICALLY TO FACE AT BEDTIME AS NEEDED    . vitamin C (ASCORBIC ACID) 500 MG tablet Take 1,000 mg by mouth daily.    [name] 20 MG TABS tablet TAKE 1 TABLET BY MOUTH ONCE DAILY WITH  SUPPER 90 tablet 1   No current facility-administered medications for this visit.    PHYSICAL EXAMINATION: ECOG PERFORMANCE STATUS: 1 - Symptomatic but completely ambulatory  Vitals:   06/24/19 0919  BP: (!) 152/68  Pulse: (!) 55  Resp: 18  Temp: 98.3 F (36.8 C)  SpO2: 100%   Filed Weights   06/24/19 0919  Weight: 149 lb 6.4 oz (67.8 kg)    GENERAL:alert, no distress and comfortable NEURO: alert &  oriented x 3 with fluent speech, no focal motor/sensory deficits  LABORATORY DATA:  I have reviewed the data as listed    Component Value Date/Time   NA 141 06/13/2019 0820   K 4.0 06/13/2019 0820   CL 108 06/13/2019 0820   CO2 25 06/13/2019 0820   GLUCOSE 96 06/13/2019 0820   BUN 11 06/13/2019 0820   CREATININE 0.81 06/13/2019 0820   CREATININE 0.76 11/17/2015 1005   CALCIUM 9.2 06/13/2019 0820   PROT 6.3 (L) 06/13/2019 0820   ALBUMIN 3.3 (L) 06/13/2019 0820   AST 15 06/13/2019 0820   ALT 10 06/13/2019 0820   ALKPHOS 80 06/13/2019 0820   BILITOT 0.3 06/13/2019 0820   GFRNONAA >60 06/13/2019 0820   GFRAA >60 06/13/2019 0820    No results found for: SPEP, UPEP  Lab Results  Component Value Date   WBC 6.5 06/13/2019   NEUTROABS 3.5 06/13/2019   HGB 10.5 (L) 06/13/2019   HCT 32.1 (L) 06/13/2019   MCV 98.2 06/13/2019   PLT 89 (L)  06/13/2019      Chemistry      Component Value Date/Time   NA 141 06/13/2019 0820   K 4.0 06/13/2019 0820   CL 108 06/13/2019 0820   CO2 25 06/13/2019 0820   BUN 11 06/13/2019 0820   CREATININE 0.81 06/13/2019 0820   CREATININE 0.76 11/17/2015 1005      Component Value Date/Time   CALCIUM 9.2 06/13/2019 0820   ALKPHOS 80 06/13/2019 0820   AST 15 06/13/2019 0820   ALT 10 06/13/2019 0820   BILITOT 0.3 06/13/2019 0820       RADIOGRAPHIC STUDIES: I reviewed multiple imaging studies with the patient and family I have personally reviewed the radiological images as listed and agreed with the findings in the report. CT CHEST W CONTRAST  Result Date: 06/21/2019 CLINICAL DATA:  Ovarian cancer assess treatment response EXAM: CT CHEST, ABDOMEN, AND PELVIS WITH CONTRAST TECHNIQUE: Multidetector CT imaging of the chest, abdomen and pelvis was performed following the standard protocol during bolus administration of intravenous contrast. CONTRAST:  OMNIPAQUE IOHEXOL 300 MG/ML  SOLN COMPARISON:  April 02, 2019 FINDINGS: CT CHEST FINDINGS  Cardiovascular: RIGHT-sided Port-A-Cath terminates at the caval to atrial junction. Heart size is stable. Central pulmonary vasculature unremarkable on venous phase assessment. Calcified and noncalcified atheromatous plaque in the thoracic aorta. Stable contour and caliber. Mediastinum/Nodes: No thoracic inlet adenopathy. Bulky LEFT axillary adenopathy greater than RIGHT axillary adenopathy. (Image 19, series 2) 18 mm LEFT axillary lymph node previously approximately 12-13 mm short axis. (Image 21, series 2) 17 mm LEFT axillary lymph node previously approximately 15 mm. (Image 22, series 2): 16 mm RIGHT axillary lymph node previously approximately 8 mm. Small pre pericardial lymph node 11 mm unchanged. (Image 44, series 2) developing nodularity and or small lymph nodes along the LEFT hemidiaphragm in the inferior pre pericardial fat. (Image 52, series 2) areas less than a cm. Lungs/Pleura: Lungs are clear.  Airways are patent. Musculoskeletal: No chest wall mass, see below for full musculoskeletal details. CT ABDOMEN PELVIS FINDINGS Hepatobiliary: Low-density LEFT hepatic lobe lesion (image 57 and 56, series 2) 8 mm, unchanged. Low-density along the posterior margin of the LEFT hemi liver just below the diaphragm measuring water density likely a cyst with ovoid well-circumscribed margins approximately a cm. No suspicious focal hepatic lesion. No biliary ductal dilation. No pericholecystic stranding. Pancreas: Normal appearance of the pancreas. Spleen: Spleen normal size and contour. Adrenals/Urinary Tract: Adrenal glands are normal. Kidneys with symmetric renal enhancement and without sign of hydronephrosis. Urinary bladder is under distended limiting assessment. Mild distal LEFT ureteral distension is unchanged. Stomach/Bowel: No acute gastrointestinal process. Appendix not visualized but there are no signs of pericecal stranding. Vascular/Lymphatic: Calcific and noncalcific atheromatous plaque in the abdominal  aorta. No aneurysm. No adenopathy in the retroperitoneum. Scattered small lymph nodes throughout the retroperitoneum with similar appearance. Reproductive: Post hysterectomy.  Post pelvic lymphadenectomy. Other: Postoperative changes in the abdomen. Ventral abdominal wall hernia. Musculoskeletal: Spinal degenerative changes without acute or destructive bone finding. IMPRESSION: 1. Bulky LEFT axillary adenopathy greater than RIGHT axillary adenopathy increasing in size. 2. Developing nodularity and or small lymph nodes along the LEFT hemidiaphragm in the inferior pre pericardial fat. Attention on follow-up. 3. Stable small lymph nodes throughout the retroperitoneum. 4. Stable low-density hepatic lesions compatible with cysts 5. Aortic atherosclerosis. Aortic Atherosclerosis (ICD10-I70.0). Electronically Signed   By: Donzetta Kohut M.D.   On: 06/21/2019 22:42   CT ABDOMEN PELVIS W CONTRAST  Result Date: 06/21/2019 CLINICAL DATA:  Ovarian cancer assess treatment response EXAM: CT CHEST, ABDOMEN, AND PELVIS WITH CONTRAST TECHNIQUE: Multidetector CT imaging of the chest, abdomen and pelvis was performed following the standard protocol during bolus administration of intravenous contrast. CONTRAST:  117m OMNIPAQUE IOHEXOL 300 MG/ML  SOLN COMPARISON:  April 02, 2019 FINDINGS: CT CHEST FINDINGS Cardiovascular: RIGHT-sided Port-A-Cath terminates at the caval to atrial junction. Heart size is stable. Central pulmonary vasculature unremarkable on venous phase assessment. Calcified and noncalcified atheromatous plaque in the thoracic aorta. Stable contour and caliber. Mediastinum/Nodes: No thoracic inlet adenopathy. Bulky LEFT axillary adenopathy greater than RIGHT axillary adenopathy. (Image 19, series 2) 18 mm LEFT axillary lymph node previously approximately 12-13 mm short axis. (Image 21, series 2) 17 mm LEFT axillary lymph node previously approximately 15 mm. (Image 22, series 2): 16 mm RIGHT axillary lymph node  previously approximately 8 mm. Small pre pericardial lymph node 11 mm unchanged. (Image 44, series 2) developing nodularity and or small lymph nodes along the LEFT hemidiaphragm in the inferior pre pericardial fat. (Image 52, series 2) areas less than a cm. Lungs/Pleura: Lungs are clear.  Airways are patent. Musculoskeletal: No chest wall mass, see below for full musculoskeletal details. CT ABDOMEN PELVIS FINDINGS Hepatobiliary: Low-density LEFT hepatic lobe lesion (image 57 and 56, series 2) 8 mm, unchanged. Low-density along the posterior margin of the LEFT hemi liver just below the diaphragm measuring water density likely a cyst with ovoid well-circumscribed margins approximately a cm. No suspicious focal hepatic lesion. No biliary ductal dilation. No pericholecystic stranding. Pancreas: Normal appearance of the pancreas. Spleen: Spleen normal size and contour. Adrenals/Urinary Tract: Adrenal glands are normal. Kidneys with symmetric renal enhancement and without sign of hydronephrosis. Urinary bladder is under distended limiting assessment. Mild distal LEFT ureteral distension is unchanged. Stomach/Bowel: No acute gastrointestinal process. Appendix not visualized but there are no signs of pericecal stranding. Vascular/Lymphatic: Calcific and noncalcific atheromatous plaque in the abdominal aorta. No aneurysm. No adenopathy in the retroperitoneum. Scattered small lymph nodes throughout the retroperitoneum with similar appearance. Reproductive: Post hysterectomy.  Post pelvic lymphadenectomy. Other: Postoperative changes in the abdomen. Ventral abdominal wall hernia. Musculoskeletal: Spinal degenerative changes without acute or destructive bone finding. IMPRESSION: 1. Bulky LEFT axillary adenopathy greater than RIGHT axillary adenopathy increasing in size. 2. Developing nodularity and or small lymph nodes along the LEFT hemidiaphragm in the inferior pre pericardial fat. Attention on follow-up. 3. Stable small  lymph nodes throughout the retroperitoneum. 4. Stable low-density hepatic lesions compatible with cysts 5. Aortic atherosclerosis. Aortic Atherosclerosis (ICD10-I70.0). Electronically Signed   By: GZetta BillsM.D.   On: 06/21/2019 22:42

## 2019-06-26 ENCOUNTER — Telehealth: Payer: Self-pay | Admitting: Family Medicine

## 2019-06-26 NOTE — Progress Notes (Signed)
  Chronic Care Management   Outreach Note  06/26/2019 Name: Haley [name] [facility] MRN: [id number] DOB: 07-09-1945  Referred by: Martinique, Dicy G, MD Reason for referral : No chief complaint on file.   A second unsuccessful telephone outreach was attempted today. The patient was referred to pharmacist for assistance with care management and care coordination.  Follow Up Plan:   [place]

## 2019-06-27 ENCOUNTER — Encounter: Payer: Self-pay | Admitting: Family Medicine

## 2019-07-02 ENCOUNTER — Other Ambulatory Visit: Payer: Self-pay

## 2019-07-03 ENCOUNTER — Ambulatory Visit (INDEPENDENT_AMBULATORY_CARE_PROVIDER_SITE_OTHER): Payer: PPO | Admitting: Family Medicine

## 2019-07-03 ENCOUNTER — Encounter: Payer: Self-pay | Admitting: Family Medicine

## 2019-07-03 VITALS — BP 130/76 | HR 67 | Temp 97.9°F | Resp 16 | Ht 62.0 in | Wt 145.2 lb

## 2019-07-03 DIAGNOSIS — I480 Paroxysmal atrial fibrillation: Secondary | ICD-10-CM | POA: Diagnosis not present

## 2019-07-03 DIAGNOSIS — W19XXXA Unspecified fall, initial encounter: Secondary | ICD-10-CM

## 2019-07-03 DIAGNOSIS — C561 Malignant neoplasm of right ovary: Secondary | ICD-10-CM

## 2019-07-03 DIAGNOSIS — Y92009 Unspecified place in unspecified non-institutional (private) residence as the place of occurrence of the external cause: Secondary | ICD-10-CM

## 2019-07-03 DIAGNOSIS — C773 Secondary and unspecified malignant neoplasm of axilla and upper limb lymph nodes: Secondary | ICD-10-CM

## 2019-07-03 DIAGNOSIS — Z7189 Other specified counseling: Secondary | ICD-10-CM

## 2019-07-03 NOTE — Telephone Encounter (Signed)
Seen today. Haley Martinique, MD

## 2019-07-03 NOTE — Progress Notes (Signed)
Chief Complaint  Patient presents with  . Discuss recent dx   HPI: [name] is a 74 y.o. female with hx of ovarian cancer,atrial fib,and who is here today with her husband to discussed recent diagnosis. She wants to discuss options.  Hx of right ovarian epithelial cancer dx'ed in 12/2017. 03/27/18 she has exploratory laparotomy with bilateral salpingo-oophorectomy,omentectomy radical tumor debulking for ovarian cancer. Following with oncologist. High grade adenocarcinoma affecting left and right ovaries,omentum,left and right fallopian tubes. Peritoneum and rectosigmoid bx: Nodules of high grade serous carcinoma. She has failed different chemo agents. Imaging has showed evidence of progression.  Seen on 03/13/19 when she was c/o tender breast mass. Dx mammogram 04/03/19: Suspicious left axillary adenopathy. Bi rads 4.  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 The differential includes metastatic high grade serous carcinoma.  According to pt, she met with her oncologist and discuss potential discontinuation of treatment. There is low probability of response with other chemo treatments. Peripheral neuropathy chemo related. She is taking Gabapentin 300 mg bid.  She feels "fine", she does not understands why other treatments can not be tried. She is upset, she think that treatment is going to be discontinued because her age.  States that she was given medication for pain and nausea but she has not taken any because she does not need them.  She has not made a decisions about code status and end of life care, she does not have an advance healthcare directives. She lives with her husband. Has 2 children, both going through hard times. Her son does not know about her recent health issues, her daughter does. She does not have "anyboby" with who she can talk about this, states that her husband does not want to do so.  Atrial fib, follows with  cardiologist. She is on Metoprolol Succinate 50 mg daily. She has not had headaches,CP,SOB,palpitations of edema.  Fell last week. Getting up from chair, did not see her dog lying on the floor,she got up and tripped.  She landed on left side, she was able to get up, no major pain. Still has some bruises but no limitation of ROM or deformity. She has taken Tylenol if needed.  Review of Systems  Constitutional: Negative for activity change, appetite change, fatigue and fever.  HENT: Negative for mouth sores, nosebleeds and sore throat.   Eyes: Negative for redness and visual disturbance.  Respiratory: Negative for cough and wheezing.   Gastrointestinal: Negative for abdominal pain and vomiting.       Negative for changes in bowel habits.  Genitourinary: Negative for decreased urine volume, dysuria and hematuria.  Musculoskeletal: Negative for gait problem and myalgias.  Skin: Negative for rash and wound.  Neurological: Negative for syncope, facial asymmetry and weakness.  Psychiatric/Behavioral: Negative for confusion. The patient is nervous/anxious.   Rest see pertinent positives and negatives per HPI.  Current Outpatient Medications on File Prior to Visit  Medication Sig Dispense Refill  . acetaminophen (TYLENOL) 500 MG tablet Take 2 tablets (1,000 mg total) by mouth every 6 (six) hours. (Patient taking differently: Take 1,000 mg by mouth every 6 (six) hours as needed. ) 30 tablet 0  . ALPRAZolam (XANAX) 0.5 MG tablet TAKE 1/2 TO 1 (ONE-HALF TO ONE) TABLET BY MOUTH ONCE DAILY AS NEEDED FOR ANXIETY 30 tablet 2  . alum & mag hydroxide-simeth (MAALOX/MYLANTA) 200-200-20 MG/5ML suspension Take 30 mLs by mouth every 6 (six) hours as needed for indigestion or heartburn. Haley [name]  mL 0  . Artificial Saliva (SALIVASURE) LOZG Use as directed 1 lozenge in the mouth or throat every 4 (four) hours as needed. 90 lozenge 0  . Calcium Carbonate (CALTRATE 600) 1500 MG TABS Take 600 mg of elemental calcium by  mouth daily.     . cholecalciferol (VITAMIN D) 1000 UNITS tablet Take 1,000 Units by mouth daily.     Marland Kitchen estradiol (ESTRACE) 0.1 MG/GM vaginal cream Place 1 Applicatorful vaginally 3 (three) times a week. 42.5 g 12  . fluticasone (CUTIVATE) 0.05 % cream APPLY CREAM TOPICALLY UP TO TWICE DAILY AS NEEDED FOR RASH    . fluticasone (FLONASE) 50 MCG/ACT nasal spray Place 2 sprays into both nostrils daily. 48 g 2  . gabapentin (NEURONTIN) 300 MG capsule Take 1 capsule (300 mg total) by mouth 2 (two) times daily. 60 capsule 11  . HYDROmorphone (DILAUDID) 2 MG tablet Take 1 tablet (2 mg total) by mouth every 4 (four) hours as needed for severe pain. 30 tablet 0  . lactose free nutrition (BOOST PLUS) LIQD Take 237 mLs by mouth daily. (Patient taking differently: Take 237 mLs by mouth 2 (two) times daily between meals. ) 10 Can 0  . levothyroxine (SYNTHROID) 75 MCG tablet Take 1 tablet (75 mcg total) by mouth daily. 90 tablet 3  . lidocaine-prilocaine (EMLA) cream Apply to affected area once 30 g 3  . Magnesium 400 MG TABS Take 250 mg by mouth daily.    . Menthol-Methyl Salicylate (MUSCLE RUB) 10-15 % CREA Apply 1 application topically as needed for muscle pain.    . metoprolol succinate (TOPROL-XL) 50 MG 24 hr tablet Take 1 tablet (50 mg total) by mouth daily. Take with or immediately following a meal. 90 tablet 2  . Multiple Vitamins-Minerals (MULTIVITAMIN WITH MINERALS) tablet Take 1 tablet by mouth daily.      . Omega-3 Fatty Acids (FISH OIL) 1000 MG CAPS Take 2,000 mg by mouth 2 (two) times daily.     Marland Kitchen omeprazole (PRILOSEC) 40 MG capsule Take 1 capsule (40 mg total) by mouth daily. 90 capsule 3  . ondansetron (ZOFRAN) 8 MG tablet Take 1 tablet (8 mg total) by mouth every 8 (eight) hours as needed (Nausea or vomiting). 30 tablet 1  . polyethylene glycol (MIRALAX / GLYCOLAX) packet Take 17 g by mouth daily. 14 each 0  . prochlorperazine (COMPAZINE) 10 MG tablet Take 1 tablet (10 mg total) by mouth every 6  (six) hours as needed (Nausea or vomiting). 30 tablet 1  . senna (SENOKOT) 8.6 MG TABS tablet Take 1 tablet (8.6 mg total) by mouth at bedtime as needed for mild constipation. 120 each 0  . simvastatin (ZOCOR) 20 MG tablet Take 1 tablet (20 mg total) by mouth at bedtime. 90 tablet 1  . tretinoin (RETIN-A) 0.025 % cream APPLY TOPICALLY TO FACE AT BEDTIME AS NEEDED    . vitamin C (ASCORBIC ACID) 500 MG tablet Take 1,000 mg by mouth daily.    [name] 20 MG TABS tablet TAKE 1 TABLET BY MOUTH ONCE DAILY WITH  SUPPER 90 tablet 1   No current facility-administered medications on file prior to visit.   Past Medical History:  Diagnosis Date  . Allergy    SEASONAL  . Anxiety   . Cataract    BILATERAL  . Dysrhythmia   . Family history of lung cancer   . Family history of prostate cancer   . Family history of prostate cancer   . Family history  of uterine cancer   . Fibromyalgia   . GERD (gastroesophageal reflux disease)   . H/O echocardiogram 04/28/08   EF>55% trace mitral regurgitation, No significant valvular pathology  . Hemorrhoids    internal  . History of stress test 02/08/2011   Normal Myocardial perfusion study, this is a low risk scan, No prior study available for comparison  . Hyperlipidemia   . Hypertension    hx of  . Hypothyroidism   . MVP (mitral valve prolapse)    mild and MR  . OA (osteoarthritis)   . Osteoporosis   . ovarian ca dx'd 12/2017   ovarian cancer  . Paroxysmal A-fib ([place])   . Sleep apnea    does not use prescribed CPAP  does not tolerate  . Thyroid disease    HYPO  . Varicosities of leg   . Vitamin D deficiency    Past Surgical History:  Procedure Laterality Date  . ABDOMINAL HYSTERECTOMY  1989  . APPENDECTOMY  1989   with hysterectomy  . BILATERAL SALPINGECTOMY N/A 03/27/2018   Procedure: EXPLORATORY LAPARATOMY, BILATERAL SALPINGOOPHERECTOMY;  Surgeon: Everitt Amber, MD;  Location: [facility];  Service: Gynecology;  Laterality: N/A;  . BREAST  EXCISIONAL BIOPSY Left 1995   Benign  . COLONOSCOPY  11-20-2000  . DEBULKING N/A 03/27/2018   Procedure: RADICAL TUMOR DEBULKING;  Surgeon: Everitt Amber, MD;  Location: [facility];  Service: Gynecology;  Laterality: N/A;  . IR IMAGING GUIDED PORT INSERTION  01/17/2018  . NOSE SURGERY  1990  . OMENTECTOMY N/A 03/27/2018   Procedure: OMENTECTOMY;  Surgeon: Everitt Amber, MD;  Location: [facility];  Service: Gynecology;  Laterality: N/A;    Allergies  Allergen Reactions  . Tramadol Hcl     jittery    Social History   Socioeconomic History  . Marital status: Married    Spouse name: Not on file  . Number of children: Not on file  . Years of education: Not on file  . Highest education level: Not on file  Occupational History  . Not on file  Tobacco Use  . Smoking status: Light Tobacco Smoker    Years: 10.00    Types: Cigars  . Smokeless tobacco: Never Used  . Tobacco comment: not daily  [place] cigars 1-2   Vaping Use  . Vaping Use: Never used  Substance and Sexual Activity  . Alcohol use: No    Alcohol/week: 0.0 standard drinks  . Drug use: No  . Sexual activity: Yes    Comment: 1st intercourse 17yo-1 partner  Other Topics Concern  . Not on file  Social History Narrative  . Not on file   Social Determinants of Health   Financial Resource Strain:   . Difficulty of Paying Living Expenses:   Food Insecurity:   . Worried About Charity fundraiser in the Last Year:   . Arboriculturist in the Last Year:   Transportation Needs:   . Film/video editor (Medical):   Marland Kitchen Lack of Transportation (Non-Medical):   Physical Activity:   . Days of Exercise per Week:   . Minutes of Exercise per Session:   Stress:   . Feeling of Stress :   Social Connections:   . Frequency of Communication with Friends and Family:   . Frequency of Social Gatherings with Friends and Family:   . Attends Religious Services:   . Active Member of Clubs or Organizations:   . Attends Archivist  Meetings:   .  Marital Status:     Vitals:   07/03/19 0803  BP: 130/76  Pulse: 67  Resp: 16  Temp: 97.9 F (36.6 C)  SpO2: 98%   Body mass index is 26.57 kg/m.  Physical Exam  Nursing note and vitals reviewed. Constitutional: She is oriented to person, place, and time. She appears well-developed. No distress.  HENT:  Head: Normocephalic and atraumatic.  Eyes: Pupils are equal, round, and reactive to light. Conjunctivae are normal.  Cardiovascular: Normal rate and regular rhythm.  No murmur heard. Respiratory: Effort normal and breath sounds normal. No respiratory distress.  GI: Soft. She exhibits no mass. There is no hepatomegaly. There is no abdominal tenderness.  Musculoskeletal:     Right hip: No tenderness or bony tenderness.     Left hip: No deformity, tenderness or bony tenderness. Normal range of motion.  Lymphadenopathy:    She has no cervical adenopathy.  Neurological: She is alert and oriented to person, place, and time. No cranial nerve deficit. Gait normal.  Skin: Skin is warm. No rash noted. No erythema.  Psychiatric: Her mood appears anxious. Her affect is labile.  Well groomed, good eye contact.   ASSESSMENT AND PLAN:  [name] was seen today for discuss recent dx.  Diagnoses and all orders for this visit:  Right ovarian epithelial cancer ([place]) Progressing despite of chemo treatment. She is planning on meeting with oncologist to have further discussions about final plan.  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 She understands prognosis is not good.  Counseling regarding end of life decision making We discussed a few options. She thinks she would have to stop all her meds if she starts hospice care and that she would need to be institutionalized  in order to receive service. Explained she will continue taking meds to manage her chronic health problems as far as they are  needed.  At this time given the fact she is otherwise asymptomatic, she may not need all services of hospice care.Explained that her health status can suddenly change and we want to be sure service is rapidly available when she needs it. She agrees with having initial hospice evaluation.  She does not want to worry her children, so prefers not to discuss her prognosis with them, at least for now. Counselling can be arranged through hospice.  In regard to code status, she would like basic CPR to be attempted. If prognosis is not good ,she does not want to be connected to a ventilator.  [facility] directive forms given today. Strongly recommend making arrangements while she is still capable of making decisions. She would like to meet again in a few weeks.  Fall in home, initial encounter Soft tissue injury, I do not think imaging is needed at thes time. Fall precautions discussed.  PAF (paroxysmal atrial fibrillation) ([place]) Well controlled. Continue Metoprolol Succinate 50 mg daily. Follows with cardiologist.  45 min face to face OV. > 50% was dedicated to discussion of Dx, prognosis, some side effects of chemo agents, and quality of life..   Return in about 4 weeks (around 07/31/2019) for follow up..   Shavell G. Martinique, MD  [facility]. [place] office.  Discharge Instructions       A few things to remember from today's visit:  I will arrange hospice. Meet with family and make a decision about end life care. Keep appointment with Dr Harrington Challenger.  If you need refills please call  your pharmacy. Do not use My Chart to request refills or for acute issues that need immediate attention.    Please be sure medication list is accurate. If a new problem present, please set up appointment sooner than planned today.

## 2019-07-03 NOTE — Patient Instructions (Signed)
A few things to remember from today's visit:   I will arrange hospice. Meet with family and make a decision about end life care. Keep appointment with Dr Harrington Challenger.  If you need refills please call your pharmacy. Do not use My Chart to request refills or for acute issues that need immediate attention.    Please be sure medication list is accurate. If a new problem present, please set up appointment sooner than planned today.

## 2019-07-05 ENCOUNTER — Telehealth: Payer: Self-pay | Admitting: Family Medicine

## 2019-07-05 NOTE — Telephone Encounter (Signed)
[name] with [facility] would like to know if Dr. Martinique, agrees to be attending pt. Haley [name], would like to start this weekend if possible. Thanks

## 2019-07-05 NOTE — Telephone Encounter (Signed)
Spoke with Haley [name] and informed her Dr. Martinique agreed to be attending physician.

## 2019-07-08 ENCOUNTER — Encounter: Payer: Self-pay | Admitting: Family Medicine

## 2019-07-08 ENCOUNTER — Encounter: Payer: Self-pay | Admitting: Hematology and Oncology

## 2019-07-09 ENCOUNTER — Telehealth: Payer: Self-pay | Admitting: Family Medicine

## 2019-07-09 NOTE — Telephone Encounter (Signed)
fyi

## 2019-07-09 NOTE — Telephone Encounter (Signed)
Patient decline hospice care because she already has Pallative Care through [facility]  Her Zorelto is covered through [facility] and not Hospice.

## 2019-07-30 ENCOUNTER — Other Ambulatory Visit: Payer: Self-pay

## 2019-07-30 ENCOUNTER — Other Ambulatory Visit: Payer: Self-pay | Admitting: Cardiology

## 2019-07-30 ENCOUNTER — Other Ambulatory Visit: Payer: Self-pay | Admitting: Family Medicine

## 2019-07-30 MED ORDER — SIMVASTATIN 20 MG PO TABS: 20.0000 mg | ORAL_TABLET | Freq: Every day | ORAL | 1 refills | Status: DC

## 2019-07-31 ENCOUNTER — Other Ambulatory Visit: Payer: Self-pay | Admitting: Family Medicine

## 2019-07-31 DIAGNOSIS — F419 Anxiety disorder, unspecified: Secondary | ICD-10-CM

## 2019-07-31 NOTE — Telephone Encounter (Signed)
Please send it to the [facility] and not the mail order   Medication Refill: [place]:  [place], [place] - [address] [place] Phone:  [phone]  Fax:  [phone]

## 2019-08-05 NOTE — Telephone Encounter (Signed)
It seems like she may have 2 refills available at her pharmacy. Can you please verify with her pharmacy. Thanks, BJ

## 2019-08-06 NOTE — Telephone Encounter (Signed)
Rx was filled yesterday for pt.

## 2019-08-13 ENCOUNTER — Encounter: Payer: Self-pay | Admitting: Family Medicine

## 2019-08-13 ENCOUNTER — Other Ambulatory Visit: Payer: Self-pay

## 2019-08-13 ENCOUNTER — Ambulatory Visit (INDEPENDENT_AMBULATORY_CARE_PROVIDER_SITE_OTHER): Payer: PPO | Admitting: Family Medicine

## 2019-08-13 VITALS — BP 128/80 | HR 55 | Temp 98.1°F | Resp 16 | Ht 62.0 in | Wt 146.2 lb

## 2019-08-13 DIAGNOSIS — I480 Paroxysmal atrial fibrillation: Secondary | ICD-10-CM | POA: Diagnosis not present

## 2019-08-13 DIAGNOSIS — F419 Anxiety disorder, unspecified: Secondary | ICD-10-CM

## 2019-08-13 DIAGNOSIS — E785 Hyperlipidemia, unspecified: Secondary | ICD-10-CM | POA: Diagnosis not present

## 2019-08-13 DIAGNOSIS — C561 Malignant neoplasm of right ovary: Secondary | ICD-10-CM | POA: Diagnosis not present

## 2019-08-13 DIAGNOSIS — I10 Essential (primary) hypertension: Secondary | ICD-10-CM | POA: Diagnosis not present

## 2019-08-13 DIAGNOSIS — T451X5A Adverse effect of antineoplastic and immunosuppressive drugs, initial encounter: Secondary | ICD-10-CM

## 2019-08-13 DIAGNOSIS — G620 Drug-induced polyneuropathy: Secondary | ICD-10-CM

## 2019-08-13 NOTE — Patient Instructions (Addendum)
If we have ordered labs or studies at this visit, it can take up to 1-2 weeks for results and processing. IF results require follow up or explanation, we will call you with instructions. Clinically stable results will be released to your [facility]. If you have not heard from [place] or cannot find your results in [facility] in 2 weeks please contact our office at [phone].  If you are not yet signed up for [facility], please consider signing up.  Keep apt with Dr. Harrington Challenger. Let me know what you decide to do. Pending hospice evaluation through your insurance.   Hospice is a service that is designed to provide people who are terminally ill and their families with medical, spiritual, and psychological support. Its aim is to improve your quality of life by keeping you as comfortable as possible in the final stages of life. Who will be my providers when I begin hospice care? Hospice teams often include:  A nurse.  A doctor. The hospice doctor will be available for your care, but you can include your regular doctor or nurse practitioner.  A Education officer, museum.  A counselor.  A religious leader (such as a Clinical biochemist).  A dietitian.  Therapists.  Trained volunteers who can help with care. What services does hospice provide? Hospice services can vary depending on the center or organization. Generally, they include:  Ways to keep you comfortable, such as: ? Providing care in your home or in a home-like setting. ? Working with your family and friends to help meet your needs. ? Allowing you to enjoy the support of loved ones by receiving much of your basic care from family and friends.  Pain relief and symptom management. The staff will supply all necessary medicines and equipment so that you can stay comfortable and alert enough to enjoy the company of your friends and family.  Visits or care from a nurse and doctor. This may include 24-hour on-call services.  Companionship when you are alone.  Allowing  you and your family to rest. Hospice staff may do light housekeeping, prepare meals, and run errands.  Counseling. They will make sure your emotional, spiritual, and social needs are being met, as well as those needs of your family members.  Spiritual care. This will be individualized to meet your needs and your family's needs. It may involve: ? Helping you and your family understand the dying process. ? Helping you say goodbye to your family and friends. ? Performing a specific religious ceremony or ritual.  Massage.  Nutrition therapy.  Physical and occupational therapy.  Short-term inpatient care, if something cannot be managed in the home.  Art or music therapy.  Bereavement support for grieving family members. When should hospice care begin? Most people who use hospice are believed to have less than 6 months to live.  Your family and health care providers can help you decide when hospice services should begin.  If you live longer than 6 months but your condition does not improve, your doctor may be able to approve you for continued hospice care.  If your condition improves, you may discontinue the program. What should I consider before selecting a program? Most hospice programs are run by nonprofit, independent organizations. Some are affiliated with hospitals, nursing homes, or home health care agencies. Hospice programs can take place in your home or at a hospice center, hospital, or skilled nursing facility. When choosing a hospice program, ask the following questions:  What services are available to me?  What services  will be offered to my loved ones?  How involved will my loved ones be?  How involved will my health care provider be?  Who makes up the hospice care team? How are they trained or screened?  How will my pain and symptoms be managed?  If my circumstances change, can the services be provided in a different setting, such as my home or in the  hospital?  Is the program reviewed and licensed by the state or certified in some other way?  What does it cost? Is it covered by insurance?  If I choose a hospice center or nursing home, where is the hospice center located? Is it convenient for family and friends?  If I choose a hospice center or nursing home, can my family and friends visit any time?  Will you provide emotional and spiritual support?  Who can my family call with questions? Where can I learn more about hospice? You can learn about existing hospice programs in your area from your health care providers. You can also read more about hospice online. The websites of the following organizations have helpful information:  [facility] and Palliative Care Organization [facility]): http://www.brown-buchanan.com/  National Association for [facility]): http://massey-hart.com/  Hospice Foundation of [place] ([place]): www.hospicefoundation.org  American Cancer Society (ACS): www.cancer.org  Hospice Net: www.hospicenet.org  Visiting Nurse Associations of [place] (VNAA): www.vnaa.org You may also find more information by contacting the following agencies:  A local agency on aging.  Your local [employer] chapter.  Your state's department of health or social services. Summary  Hospice is a service that is designed to provide people who are terminally ill and their families with medical, spiritual, and psychological support.  Hospice aims to improve your quality of life by keeping you as comfortable as possible in the final stages of life.  Hospice teams often include a doctor, nurse, social worker, counselor, religious leader,dietitian, therapists, and volunteers.  Hospice care generally includes medicine for symptom management, visits from doctors and nurses, physical and occupational therapy, nutrition counseling, spiritual and emotional counseling, caregiver support, and bereavement support for grieving family members.  Hospice  programs can take place in your home or at a hospice center, hospital, or skilled nursing facility. This information is not intended to replace advice given to you by your health care provider. Make sure you discuss any questions you have with your health care provider. Document Revised: 09/19/2018 Document Reviewed: 01/19/2016 Elsevier Patient Education  [place].

## 2019-08-13 NOTE — Progress Notes (Addendum)
HPI: Haley [name] is a 74 y.o. female with hx of ovarian cancer with axillary lymph node metastasis,carcinomatosis,anxiety,and atrial fib here today for follow up.   She was last seen on 07/03/19, when we had a discussion about end of life planning. She has not followed with oncologist. Hospice agency visited but she did not like provider approach.  States that she does not understand what type of decisions she has to make.  Still feeling well. Dr Gorsuch,oncologist,prescribed Hydromorphone 2 mg in case she has pain,she has not picked up medication.  States that she is feeling fine. No changes in appetite or daily activities. No abdominal pain,nausea,vomiting,or urinary symptoms.  All her children already know about Dx and prognosis but there has not been a conversation to discuss details and make decisions. She has not decided about code status, she does not want to died but she does not want to suffer either.  She does not want to die in her house. She would like to be transferred to inpt hospice when she is rapidly declining. She does not think her husband is able to take care of her and she doesnot want to die in the house he will continue living in.  Still taking all her medications and supplements.  YJE:HUDJSH Simvastatin 20 mg.  Lab Results  Component Value Date   CHOL 157 11/03/2017   HDL 56.00 11/03/2017   LDLCALC 70 11/03/2017   LDLDIRECT 84.3 12/16/2010   TRIG 154.0 (H) 11/03/2017   CHOLHDL 3 11/03/2017   HTN and paroxysmal atrial fib: She is on Xarelto 20 mg and Metoprolol Succinate 50 mg daily. Negative for headache,chest pain,SOB,or palpitations.  Anxiety: She takes Alprazolam 0.5 mg daily as needed. She still has refills at her pharmacy. She does not feel like anxiety is getting worse. No depressed mood.  Vaginal atrophy: On Estradiol vaginal cream 3 times per week.  Chemo-induce neuropathy: She takes Gabapentin 300 mg bid. Medication is  helping.   Review of Systems  Constitutional: Negative for activity change, appetite change and fatigue.  HENT: Negative for sore throat and trouble swallowing.   Respiratory: Negative for cough and wheezing.   Endocrine: Negative for cold intolerance and heat intolerance.  Genitourinary: Negative for decreased urine volume, dysuria and hematuria.  Neurological: Negative for syncope and weakness.  Rest of ROS, see pertinent positives sand negatives in HPI  Current Outpatient Medications on File Prior to Visit  Medication Sig Dispense Refill  . acetaminophen (TYLENOL) 500 MG tablet Take 2 tablets (1,000 mg total) by mouth every 6 (six) hours. (Patient taking differently: Take 1,000 mg by mouth every 6 (six) hours as needed. ) 30 tablet 0  . ALPRAZolam (XANAX) 0.5 MG tablet TAKE 1/2 TO 1 (ONE-HALF TO ONE) TABLET BY MOUTH ONCE DAILY AS NEEDED FOR ANXIETY 30 tablet 2  . alum & mag hydroxide-simeth (MAALOX/MYLANTA) 200-200-20 MG/5ML suspension Take 30 mLs by mouth every 6 (six) hours as needed for indigestion or heartburn. 355 mL 0  . Artificial Saliva (SALIVASURE) LOZG Use as directed 1 lozenge in the mouth or throat every 4 (four) hours as needed. 90 lozenge 0  . Calcium Carbonate (CALTRATE 600) 1500 MG TABS Take 600 mg of elemental calcium by mouth daily.     . cholecalciferol (VITAMIN D) 1000 UNITS tablet Take 1,000 Units by mouth daily.     Marland Kitchen estradiol (ESTRACE) 0.1 MG/GM vaginal cream Place 1 Applicatorful vaginally 3 (three) times a week. 42.5 g 12  .  fluticasone (CUTIVATE) 0.05 % cream APPLY CREAM TOPICALLY UP TO TWICE DAILY AS NEEDED FOR RASH    . fluticasone (FLONASE) 50 MCG/ACT nasal spray Place 2 sprays into both nostrils daily. 48 g 2  . gabapentin (NEURONTIN) 300 MG capsule Take 1 capsule (300 mg total) by mouth 2 (two) times daily. 60 capsule 11  . HYDROmorphone (DILAUDID) 2 MG tablet Take 1 tablet (2 mg total) by mouth every 4 (four) hours as needed for severe pain. 30 tablet 0  .  lactose free nutrition (BOOST PLUS) LIQD Take 237 mLs by mouth daily. (Patient taking differently: Take 237 mLs by mouth 2 (two) times daily between meals. ) 10 Can 0  . levothyroxine (SYNTHROID) 75 MCG tablet Take 1 tablet by mouth every day 90 tablet 1  . lidocaine-prilocaine (EMLA) cream Apply to affected area once 30 g 3  . Magnesium 400 MG TABS Take 250 mg by mouth daily.    . Menthol-Methyl Salicylate (MUSCLE RUB) 10-15 % CREA Apply 1 application topically as needed for muscle pain.    . metoprolol succinate (TOPROL-XL) 50 MG 24 hr tablet Take 1 tablet (50 mg total) by mouth daily. Take with or immediately following a meal. 90 tablet 2  . Multiple Vitamins-Minerals (MULTIVITAMIN WITH MINERALS) tablet Take 1 tablet by mouth daily.      . Omega-3 Fatty Acids (FISH OIL) 1000 MG CAPS Take 2,000 mg by mouth 2 (two) times daily.     Marland Kitchen omeprazole (PRILOSEC) 40 MG capsule Take 1 capsule (40 mg total) by mouth daily. 90 capsule 3  . ondansetron (ZOFRAN) 8 MG tablet Take 1 tablet (8 mg total) by mouth every 8 (eight) hours as needed (Nausea or vomiting). 30 tablet 1  . polyethylene glycol (MIRALAX / GLYCOLAX) packet Take 17 g by mouth daily. 14 each 0  . prochlorperazine (COMPAZINE) 10 MG tablet Take 1 tablet (10 mg total) by mouth every 6 (six) hours as needed (Nausea or vomiting). 30 tablet 1  . senna (SENOKOT) 8.6 MG TABS tablet Take 1 tablet (8.6 mg total) by mouth at bedtime as needed for mild constipation. 120 each 0  . simvastatin (ZOCOR) 20 MG tablet Take 1 tablet (20 mg total) by mouth at bedtime. 90 tablet 1  . tretinoin (RETIN-A) 0.025 % cream APPLY TOPICALLY TO FACE AT BEDTIME AS NEEDED    . vitamin C (ASCORBIC ACID) 500 MG tablet Take 1,000 mg by mouth daily.    [name] 20 MG TABS tablet TAKE 1 TABLET BY MOUTH ONCE DAILY WITH  SUPPER 90 tablet 1   No current facility-administered medications on file prior to visit.     Past Medical History:  Diagnosis Date  . Allergy    SEASONAL    . Anxiety   . Cataract    BILATERAL  . Dysrhythmia   . Family history of lung cancer   . Family history of prostate cancer   . Family history of prostate cancer   . Family history of uterine cancer   . Fibromyalgia   . GERD (gastroesophageal reflux disease)   . H/O echocardiogram 04/28/08   EF>55% trace mitral regurgitation, No significant valvular pathology  . Hemorrhoids    internal  . History of stress test 02/08/2011   Normal Myocardial perfusion study, this is a low risk scan, No prior study available for comparison  . Hyperlipidemia   . Hypertension    hx of  . Hypothyroidism   . MVP (mitral valve prolapse)  mild and MR  . OA (osteoarthritis)   . Osteoporosis   . ovarian ca dx'd 12/2017   ovarian cancer  . Paroxysmal A-fib ([place])   . Sleep apnea    does not use prescribed CPAP  does not tolerate  . Thyroid disease    HYPO  . Varicosities of leg   . Vitamin D deficiency    Allergies  Allergen Reactions  . Tramadol Hcl     jittery    Social History   Socioeconomic History  . Marital status: Married    Spouse name: Not on file  . Number of children: Not on file  . Years of education: Not on file  . Highest education level: Not on file  Occupational History  . Not on file  Tobacco Use  . Smoking status: Light Tobacco Smoker    Years: 10.00    Types: Cigars  . Smokeless tobacco: Never Used  . Tobacco comment: not daily  [place] cigars 1-2   Vaping Use  . Vaping Use: Never used  Substance and Sexual Activity  . Alcohol use: No    Alcohol/week: 0.0 standard drinks  . Drug use: No  . Sexual activity: Yes    Comment: 1st intercourse 17yo-1 partner  Other Topics Concern  . Not on file  Social History Narrative  . Not on file   Social Determinants of Health   Financial Resource Strain:   . Difficulty of Paying Living Expenses:   Food Insecurity:   . Worried About Charity fundraiser in the Last Year:   . Arboriculturist in the Last Year:    Transportation Needs:   . Film/video editor (Medical):   Marland Kitchen Lack of Transportation (Non-Medical):   Physical Activity:   . Days of Exercise per Week:   . Minutes of Exercise per Session:   Stress:   . Feeling of Stress :   Social Connections:   . Frequency of Communication with Friends and Family:   . Frequency of Social Gatherings with Friends and Family:   . Attends Religious Services:   . Active Member of Clubs or Organizations:   . Attends Archivist Meetings:   Marland Kitchen Marital Status:     Vitals:   08/13/19 0803  BP: 128/80  Pulse: (!) 55  Resp: 16  Temp: 98.1 F (36.7 C)  SpO2: 98%   Body mass index is 26.75 kg/m.  Physical Exam Vitals and nursing note reviewed.  Constitutional:      General: She is not in acute distress.    Appearance: She is well-developed.  HENT:     Head: Normocephalic and atraumatic.  Eyes:     Conjunctiva/sclera: Conjunctivae normal.  Cardiovascular:     Rate and Rhythm: Regular rhythm. Bradycardia present.     Heart sounds: No murmur heard.   Pulmonary:     Effort: Pulmonary effort is normal. No respiratory distress.     Breath sounds: Normal breath sounds.  Abdominal:     Palpations: There is no hepatomegaly.  Lymphadenopathy:     Cervical: No cervical adenopathy.  Skin:    General: Skin is warm.     Findings: No erythema or rash.  Neurological:     Mental Status: She is alert and oriented to person, place, and time.     Gait: Gait normal.  Psychiatric:     Comments: Well groomed, good eye contact.    ASSESSMENT AND PLAN:  Haley [name] was seen  today for follow-up.  Diagnoses and all orders for this visit:  Dyslipidemia We discussed long term benefits of statins, I think she could discontinue med. She would like to continue Simvastatin.  Right ovarian epithelial cancer ([place]) Last chemo infusion 06/13/19. Relapse right after chemo was discontinued. Reporting no pain except for "little" left axillary  pain. She has an appt with her gyn on 09/27/19.  PAF (paroxysmal atrial fibrillation) ([facility]) Rhythm and rate controlled. Continue Xarelto and Metoprolol succinate.  Essential hypertension, benign BP adequately controlled. No changes in current management.  Anxiety Stable. Continue Alprazolam 0.5 mg daily as needed  Peripheral neuropathy due to chemotherapy ([place]) Well controlled. Continue Gabapentin 300 mg bid.  Spent 40 minutes with pt: Obtaining hx,reviewing recent labs/imaging, as well as documentation and discussion of plan. I spent additional 17 min discussing advance care planning.  [employer] forms given and reviewed ,so she has an idea of decisions she needs to make and discuss with her family.  States that Healthcare team (health insurance)has hospice program and she is supposed to be evaluated in the next few days.  She does not want to make a decision at this time.  For now she wants to be full code as far as she is not having symptoms/independent ADL's and IADL's. If she is declining and bedridden she is not clear about what she wants except for the fact she does "not want to suffer" and she does "not want to die at home."  She wants a copy of this visit mail to her address. I explained that she can see my note through my chart but she would like a hard copy,she cannot print documents at home. F/U as needed  Shy G. Martinique, MD  [name]. [place] office.

## 2019-08-15 ENCOUNTER — Encounter: Payer: Self-pay | Admitting: Family Medicine

## 2019-08-15 ENCOUNTER — Telehealth: Payer: Self-pay | Admitting: Hematology and Oncology

## 2019-08-15 NOTE — Telephone Encounter (Signed)
NTIRWER:[id number] Pt requested medical records, printed them and pt will pick them up.

## 2019-08-20 ENCOUNTER — Other Ambulatory Visit: Payer: Self-pay | Admitting: Family Medicine

## 2019-08-20 MED ORDER — HYDROMORPHONE HCL 2 MG PO TABS: 2.0000 mg | ORAL_TABLET | Freq: Every day | ORAL | 0 refills | Status: DC | PRN

## 2019-08-20 MED ORDER — GABAPENTIN 300 MG PO CAPS: 300.0000 mg | ORAL_CAPSULE | Freq: Two times a day (BID) | ORAL | 3 refills | Status: DC

## 2019-08-20 NOTE — Telephone Encounter (Signed)
Pharmacy is calling stating that the pt is at the pharmacy stating that Dr. Martinique said that she would call in the following Rx's for the pt gabapentin (NEURONTIN) 300 MG and HYDROmorphone (DILAUDID) 2 MG both #90  Pharm:  [facility]  (pharmacy stated that this medication was given by the cancer center)

## 2019-08-21 ENCOUNTER — Telehealth: Payer: Self-pay

## 2019-08-21 NOTE — Telephone Encounter (Signed)
Reviewed above with [name], NP.   Offered a phone visit to patient to discuss second opinion. Pt agreed to phone visit at 1445 tomorrow.

## 2019-08-21 NOTE — Telephone Encounter (Signed)
Haley [name] is calling to speak to Dr. Denman George regarding referral for a second opinion for possible treatment options for her cancer. She would like a referral to [facility] or [facility] or whomever Dr. Denman George recommends. Patient stated that where there is life there is hope. She is not giving up. Told her that this information will be relayed to Dr. Denman George and [name], NP. Dr. Denman George is not in the office today but she will be in the office tomorrow to review

## 2019-08-22 ENCOUNTER — Inpatient Hospital Stay: Payer: PPO | Attending: Obstetrics | Admitting: Gynecologic Oncology

## 2019-08-22 DIAGNOSIS — C561 Malignant neoplasm of right ovary: Secondary | ICD-10-CM

## 2019-08-22 NOTE — Progress Notes (Signed)
Gynecologic Oncology Follow-up Note  I connected with Haley [name] on 08/22/19 at  2:45 PM EDT by telephone and verified that I am speaking with the correct person using two identifiers.  I discussed the limitations, risks, security and privacy concerns of performing an evaluation and management service by telemedicine and the availability of in-person appointments. I also discussed with the patient that there may be a patient responsible charge related to this service. The patient expressed understanding and agreed to proceed.  Other persons participating in the visit and their role in the encounter: none.  Patient's location: home Provider's location: [place]  Chief Complaint:  Chief Complaint  Patient presents with   recurrent ovarian cancer   Assessment/Plan:  Haley [name]  is a 74 y.o.  year old with recurrent, progressive, platinum resistant stage IIIC ovarian cancer (BRCA negative, PDL1 positive). Disease predominantly in the retroperitoneum (axillary, chest, retroperitoneal nodes).   Haley [name] was counseled and offered 4th line salvage therapy but initially declined due to futility and her current state of good QOL (her cancer is asymptomatic). However, she has reconsidered her options and is determined to restart therapy. She has apparently good performance status and is asymptomatic from her cancer. I am supportive of her attempting 4th line therapy. Pembrolizumab would be a good option given that her tumor is PDL1 positive. There are alternative cytotoxic regimens that could be considered (weekly taxol, topotecan etc).  We revisited the prognosis of her disease. I discussed that it is universally fatal, however, she may have an prolonged response to therapy. Therefore while treatment goals are palliative, they may still confer some benefit.   I provided her with the names and locations of 4 medical/gynecologic oncologists in the area and at nearby  institutions. She will consider these and notify our nurse navigator when she decides where she would like to visit for consultation.   HPI:   Haley [name] is a 74 year old woman who was seen in consultation at the request of Dr Phineas Real in January, 2020 (initially by Dr Precious Haws) for apparent stage IIIC ovarian cancer. Her symptoms began with constipation in 2019. On 01/06/18 she felt significant pain and cramps and due to pain went to [facility]. Imaging revealed a pelvic mass and carcinomatosis concerning for ovarian cancer.  The 01/06/18 CT notes: Complex cystic mass at the right adnexa, measuring 5.9 x 4.0 cm, with nodular components, concerning for primary ovarian malignancy. Diffuse nodularity along the omentum at the left side of the abdomen, extending into the mesentery at the left mid abdomen, concerning for peritoneal carcinomatosis. Wall thickening at the distal ileum adjacent to the ovarian mass; bowel loops appear somewhat adherent to the ovarian mass. Bowel infiltration with tumor cannot be excluded. No evidence of bowel obstruction at this time. Small volume ascites within the abdomen and pelvis.  She followed up with Gynecologist Dr.Fontaine, who, upon reviewing her case encouraged her to followup with gynecologic oncology (Dr Gerarda Fraction), out of concern for ovarian cancer. CA125 was drawn in January, 2020 with Dr. Phineas Real and was elevated at 3004.  Cytology from paracentesis on 01/15/18 showed malignant cells consistent with metastatic adenocarcinoma.POSITIVE FOR MOC-31, CYTOKERATIN 7, ESTROGEN RECEPTOR, PAX-8, AND WT-1. THEY ARE NEGATIVE FOR CALRETININ, CYTOKERATIN 5/6, AND CYTOKERATIN 20. The profile was consistent with a gyn primary.  Due to her apparent small bowel involvement with obstruction at the time of presentation, she was treated with neoadjuvant chemotherapy with Dr Alvy Bimler for 3  cycles (starting on 01/18/18).   CA 125 had improved at the time of cycle 3 and was  174.   CT abd/pelvis on March 15, 2018 (after 3 cycles of NACT) showed bilateral adnexal masses remained in situ measuring 6.2 on the right previously 4.9 cm and 4.7 cm on the left previously 5.3 cm.  The ascites had resolved.  The omental caking was mildly improved.  There was no suspicious lymphadenopathy.  There was mild bilateral hydronephrosis that was new felt to be secondary to extrinsic compression from the adnexal masses.  No new lesions were seen.  On 03/27/18 she underwent exploratory laparotomy, BSO, omentectomy radical tumor debulking (with Dr Denman George as Dr Gerarda Fraction was no longer at the practice). Intraoperative findings were significant for omental caking, peritoneal nodules, diaphragmatic nodularity, nodularity on sigmoid colon. At the completion of the procedure there was <1cm disease at any location representing R1 optimal cytoreduction. She did well postoperatively with no specific complications.   She then went on to complete 3 additional cycles of carboplatin paclitaxel adjuvant chemotherapy completed in June 2020.  Ca1 25 performed on July 02, 2018 6:20 cycle was 42.7.  Genetic testing on May 07, 2018 was negative for BRCA mutations or other gene mutations. Tumor testing of PDL1 was positive (5%).   After completing her 6th cycle of carb/taxol, it was felt that she had persistent disease and was treated with doxorubicin and bevacizumab as platinum resistant progressive ovarian cancer between July 16, 2018 and December 03, 2018.  Her CA-125 continued to rise on this regimen, elevating to the level of 196 in October 2020.  Imaging at that time revealed no significant change from her July study.  There continue to be right pericardiophrenic lymph node that was unchanged.  There was an 8 mm omental nodule identified.  She was off chemotherapy between November 2020 and March 2021.  Imaging at that time confirmed interval development of left axillary and subpectoral lymphadenopathy concerning  for metastatic disease.  The patient palpated this adenopathy herself. This was biopsied on April 03, 2019 confirming recurrent disease.  Immunostains and histologic profile were consistent with high-grade serous carcinoma.  She commenced salvage chemotherapy with Gemzar on April 15, 2019 and continue this until June 13, 2019.  Repeat imaging on June 21, 2019 confirmed bulky left axillary adenopathy greater than the right adenopathy.  There was also developing nodularity in lymph nodes along the left hemidiaphragm and pericardial fat.  There was stable small lymph nodes throughout the retroperitoneum.  There was no obvious carcinomatosis or intraperitoneal disease.  This scan confirmed progression of disease and she was taken off Gemzar chemotherapy.  Discussion at that time was made with the patient regarding fourth line salvage therapy and the lower likelihood of response and the lack of possibility of cure of her platinum resistant disease.  Given that she was somewhat asymptomatic from her cancer, at that time she elected for no additional treatment at that time to maximize quality of life and avoid futile therapies.  Haley [name] requested to see me in June, 2021 to discuss options and elected for hospice care/no further therapy in order to preserve quality of life. At that time she stated that she wanted to maintain her good quality of life (she was symptom free).  Interval Hx:  Haley [name] requested she revisit treatment options with me in August, 2021. She had given things some thought and felt that she wanted to still have "hope". She expressed that she thought that treatment was necessary  to continue hope.  She denied symptoms of her cancer.  She denied symptoms of carcinomatosis.  She denied enduring toxicities from her prior chemotherapy treatments.  Her performance status was ECOG 0.  She expressed interest in seeking care for chemotherapy administration elsewhere with a second opinion.   Current Meds:   Outpatient Encounter Medications as of 08/22/2019  Medication Sig   acetaminophen (TYLENOL) 500 MG tablet Take 2 tablets (1,000 mg total) by mouth every 6 (six) hours. (Patient taking differently: Take 1,000 mg by mouth every 6 (six) hours as needed. )   ALPRAZolam (XANAX) 0.5 MG tablet TAKE 1/2 TO 1 (ONE-HALF TO ONE) TABLET BY MOUTH ONCE DAILY AS NEEDED FOR ANXIETY   alum & mag hydroxide-simeth (MAALOX/MYLANTA) 200-200-20 MG/5ML suspension Take 30 mLs by mouth every 6 (six) hours as needed for indigestion or heartburn.   Artificial Saliva (SALIVASURE) LOZG Use as directed 1 lozenge in the mouth or throat every 4 (four) hours as needed.   Calcium Carbonate (CALTRATE 600) 1500 MG TABS Take 600 mg of elemental calcium by mouth daily.    cholecalciferol (VITAMIN D) 1000 UNITS tablet Take 1,000 Units by mouth daily.    estradiol (ESTRACE) 0.1 MG/GM vaginal cream Place 1 Applicatorful vaginally 3 (three) times a week.   fluticasone (CUTIVATE) 0.05 % cream APPLY CREAM TOPICALLY UP TO TWICE DAILY AS NEEDED FOR RASH   fluticasone (FLONASE) 50 MCG/ACT nasal spray Place 2 sprays into both nostrils daily.   gabapentin (NEURONTIN) 300 MG capsule Take 1 capsule (300 mg total) by mouth 2 (two) times daily.   HYDROmorphone (DILAUDID) 2 MG tablet Take 1 tablet (2 mg total) by mouth daily as needed for severe pain.   lactose free nutrition (BOOST PLUS) LIQD Take 237 mLs by mouth daily. (Patient taking differently: Take 237 mLs by mouth 2 (two) times daily between meals. )   levothyroxine (SYNTHROID) 75 MCG tablet Take 1 tablet by mouth every day   lidocaine-prilocaine (EMLA) cream Apply to affected area once   Magnesium 400 MG TABS Take 250 mg by mouth daily.   Menthol-Methyl Salicylate (MUSCLE RUB) 10-15 % CREA Apply 1 application topically as needed for muscle pain.   metoprolol succinate (TOPROL-XL) 50 MG 24 hr tablet Take 1 tablet (50 mg total) by mouth daily. Take with or immediately  following a meal.   Multiple Vitamins-Minerals (MULTIVITAMIN WITH MINERALS) tablet Take 1 tablet by mouth daily.     Omega-3 Fatty Acids (FISH OIL) 1000 MG CAPS Take 2,000 mg by mouth 2 (two) times daily.    omeprazole (PRILOSEC) 40 MG capsule Take 1 capsule (40 mg total) by mouth daily.   ondansetron (ZOFRAN) 8 MG tablet Take 1 tablet (8 mg total) by mouth every 8 (eight) hours as needed (Nausea or vomiting).   polyethylene glycol (MIRALAX / GLYCOLAX) packet Take 17 g by mouth daily.   prochlorperazine (COMPAZINE) 10 MG tablet Take 1 tablet (10 mg total) by mouth every 6 (six) hours as needed (Nausea or vomiting).   senna (SENOKOT) 8.6 MG TABS tablet Take 1 tablet (8.6 mg total) by mouth at bedtime as needed for mild constipation.   simvastatin (ZOCOR) 20 MG tablet Take 1 tablet (20 mg total) by mouth at bedtime.   tretinoin (RETIN-A) 0.025 % cream APPLY TOPICALLY TO FACE AT BEDTIME AS NEEDED   vitamin C (ASCORBIC ACID) 500 MG tablet Take 1,000 mg by mouth daily.   XARELTO 20 MG TABS tablet TAKE 1 TABLET BY MOUTH ONCE DAILY WITH  SUPPER   No facility-administered encounter medications on file as of 08/22/2019.    Allergy:  Allergies  Allergen Reactions   Tramadol Hcl     jittery    Social Hx:   Social History   Socioeconomic History   Marital status: Married    Spouse name: Not on file   Number of children: Not on file   Years of education: Not on file   Highest education level: Not on file  Occupational History   Not on file  Tobacco Use   Smoking status: Light Tobacco Smoker    Years: 10.00    Types: Cigars   Smokeless tobacco: Never Used   Tobacco comment: not daily  [place] cigars 1-2   Vaping Use   Vaping Use: Never used  Substance and Sexual Activity   Alcohol use: No    Alcohol/week: 0.0 standard drinks   Drug use: No   Sexual activity: Yes    Comment: 1st intercourse 17yo-1 partner  Other Topics Concern   Not on file  Social History  Narrative   Not on file   Social Determinants of Health   Financial Resource Strain:    Difficulty of Paying Living Expenses:   Food Insecurity:    Worried About Charity fundraiser in the Last Year:    Arboriculturist in the Last Year:   Transportation Needs:    Film/video editor (Medical):    Lack of Transportation (Non-Medical):   Physical Activity:    Days of Exercise per Week:    Minutes of Exercise per Session:   Stress:    Feeling of Stress :   Social Connections:    Frequency of Communication with Friends and Family:    Frequency of Social Gatherings with Friends and Family:    Attends Religious Services:    Active Member of Clubs or Organizations:    Attends Music therapist:    Marital Status:   Intimate Partner Violence:    Fear of Current or Ex-Partner:    Emotionally Abused:    Physically Abused:    Sexually Abused:     Past Surgical Hx:  Past Surgical History:  Procedure Laterality Date   [place]   with hysterectomy   BILATERAL SALPINGECTOMY N/A 03/27/2018   Procedure: EXPLORATORY LAPARATOMY, BILATERAL SALPINGOOPHERECTOMY;  Surgeon: Everitt Amber, MD;  Location: [facility];  Service: Gynecology;  Laterality: N/A;   BREAST EXCISIONAL BIOPSY Left 1995   Benign   COLONOSCOPY  11-20-2000   DEBULKING N/A 03/27/2018   Procedure: RADICAL TUMOR DEBULKING;  Surgeon: Everitt Amber, MD;  Location: [facility];  Service: Gynecology;  Laterality: N/A;   IR IMAGING GUIDED PORT INSERTION  01/17/2018   NOSE SURGERY  1990   OMENTECTOMY N/A 03/27/2018   Procedure: OMENTECTOMY;  Surgeon: Everitt Amber, MD;  Location: [facility];  Service: Gynecology;  Laterality: N/A;    Past Medical Hx:  Past Medical History:  Diagnosis Date   Allergy    SEASONAL   Anxiety    Cataract    BILATERAL   Dysrhythmia    Family history of lung cancer    Family history of prostate cancer    Family history of  prostate cancer    Family history of uterine cancer    Fibromyalgia    GERD (gastroesophageal reflux disease)    H/O echocardiogram 04/28/08   EF>55% trace mitral regurgitation, No significant valvular pathology   Hemorrhoids  internal   History of stress test 02/08/2011   Normal Myocardial perfusion study, this is a low risk scan, No prior study available for comparison   Hyperlipidemia    Hypertension    hx of   Hypothyroidism    MVP (mitral valve prolapse)    mild and MR   OA (osteoarthritis)    Osteoporosis    ovarian ca dx'd 12/2017   ovarian cancer   Paroxysmal A-fib (HCC)    Sleep apnea    does not use prescribed CPAP  does not tolerate   Thyroid disease    HYPO   Varicosities of leg    Vitamin D deficiency     Past Gynecological History:  Abdominal hysterectomy for benign desease. No LMP recorded. Patient has had a hysterectomy.  Family Hx:  Family History  Problem Relation Age of Onset   Diabetes Daughter    Diabetes Mother    Hypertension Mother    Heart disease Mother    Stroke Maternal Grandmother    Lung cancer Niece    Cancer Paternal Aunt        type of cancer unk   Prostate cancer Paternal Uncle    Uterine cancer Cousin        dx under 25   Colon cancer Neg Hx     Review of Systems:  Constitutional  Feels fatigued   ENT Normal appearing ears and nares bilaterally Skin/Breast  No rash, sores, jaundice, itching, dryness Cardiovascular  No chest pain, shortness of breath, or edema  Pulmonary  No cough or wheeze.  Gastro Intestinal  No nausea, vomitting, or diarrhoea. No bright red blood per rectum, no abdominal pain, change in bowel movement, or constipation.  Genito Urinary  No frequency, urgency, dysuria, no bleeding Musculo Skeletal  No myalgia, arthralgia, joint swelling or pain  Neurologic  No weakness, numbness, change in gait,  Psychology  No depression, anxiety, insomnia.   Vitals:  There were no  vitals taken for this visit.  Physical Exam: Deferred due to telehealth visit.  45 minutes of direct face to face counseling time was spent with the patient. This included discussion about prognosis, therapy recommendations and end of life counseling.   Thereasa Solo, MD  08/22/2019, 3:42 PM

## 2019-08-26 ENCOUNTER — Other Ambulatory Visit: Payer: Self-pay | Admitting: Cardiovascular Disease

## 2019-08-27 ENCOUNTER — Telehealth: Payer: Self-pay | Admitting: Oncology

## 2019-08-27 DIAGNOSIS — C561 Malignant neoplasm of right ovary: Secondary | ICD-10-CM

## 2019-08-27 NOTE — Telephone Encounter (Signed)
Ritaj called and said she has decided to see Dr. Delton Coombes at [facility]. Advised her that will call her back with an appointment.

## 2019-09-06 ENCOUNTER — Encounter (HOSPITAL_COMMUNITY): Payer: Self-pay

## 2019-09-06 NOTE — Progress Notes (Signed)
I attempted to place an introductory phone call to the patient prior to initial visit with Dr. Delton Coombes. Unable to reach patient at this time. I left a detailed VM introducing myself and providing my contact information. I explained that I will plan to meet with the patient on Monday during initial visit with Dr. Delton Coombes.

## 2019-09-09 ENCOUNTER — Encounter (HOSPITAL_COMMUNITY): Payer: Self-pay | Admitting: Hematology

## 2019-09-09 ENCOUNTER — Other Ambulatory Visit: Payer: Self-pay

## 2019-09-09 ENCOUNTER — Inpatient Hospital Stay (HOSPITAL_COMMUNITY): Payer: PPO

## 2019-09-09 ENCOUNTER — Inpatient Hospital Stay (HOSPITAL_COMMUNITY): Payer: PPO | Attending: Hematology | Admitting: Hematology

## 2019-09-09 VITALS — BP 133/84 | HR 49 | Temp 97.9°F | Resp 16 | Wt 147.2 lb

## 2019-09-09 DIAGNOSIS — Z79899 Other long term (current) drug therapy: Secondary | ICD-10-CM | POA: Insufficient documentation

## 2019-09-09 DIAGNOSIS — Z801 Family history of malignant neoplasm of trachea, bronchus and lung: Secondary | ICD-10-CM | POA: Diagnosis not present

## 2019-09-09 DIAGNOSIS — F17290 Nicotine dependence, other tobacco product, uncomplicated: Secondary | ICD-10-CM | POA: Diagnosis not present

## 2019-09-09 DIAGNOSIS — E039 Hypothyroidism, unspecified: Secondary | ICD-10-CM | POA: Diagnosis not present

## 2019-09-09 DIAGNOSIS — C773 Secondary and unspecified malignant neoplasm of axilla and upper limb lymph nodes: Secondary | ICD-10-CM | POA: Diagnosis not present

## 2019-09-09 DIAGNOSIS — C561 Malignant neoplasm of right ovary: Secondary | ICD-10-CM | POA: Diagnosis not present

## 2019-09-09 DIAGNOSIS — Z803 Family history of malignant neoplasm of breast: Secondary | ICD-10-CM | POA: Diagnosis not present

## 2019-09-09 DIAGNOSIS — Z9221 Personal history of antineoplastic chemotherapy: Secondary | ICD-10-CM | POA: Diagnosis not present

## 2019-09-09 DIAGNOSIS — F419 Anxiety disorder, unspecified: Secondary | ICD-10-CM | POA: Insufficient documentation

## 2019-09-09 DIAGNOSIS — Z7901 Long term (current) use of anticoagulants: Secondary | ICD-10-CM | POA: Diagnosis not present

## 2019-09-09 DIAGNOSIS — I10 Essential (primary) hypertension: Secondary | ICD-10-CM | POA: Diagnosis not present

## 2019-09-09 DIAGNOSIS — I480 Paroxysmal atrial fibrillation: Secondary | ICD-10-CM | POA: Diagnosis not present

## 2019-09-09 LAB — COMPREHENSIVE METABOLIC PANEL
ALT: 15 U/L (ref 0–44)
AST: 14 U/L — ABNORMAL LOW (ref 15–41)
Albumin: 3.8 g/dL (ref 3.5–5.0)
Alkaline Phosphatase: 71 U/L (ref 38–126)
Anion gap: 6 (ref 5–15)
BUN: 11 mg/dL (ref 8–23)
CO2: 26 mmol/L (ref 22–32)
Calcium: 8.6 mg/dL — ABNORMAL LOW (ref 8.9–10.3)
Chloride: 105 mmol/L (ref 98–111)
Creatinine, Ser: 0.71 mg/dL (ref 0.44–1.00)
GFR calc Af Amer: >60 mL/min (ref >60)
GFR calc non Af Amer: >60 mL/min (ref >60)
Glucose, Bld: 98 mg/dL (ref 70–99)
Potassium: 4.3 mmol/L (ref 3.5–5.1)
Sodium: 137 mmol/L (ref 135–145)
Total Bilirubin: 0.4 mg/dL (ref 0.3–1.2)
Total Protein: 7.5 g/dL (ref 6.5–8.1)

## 2019-09-09 LAB — CBC WITH DIFFERENTIAL/PLATELET
Abs Immature Granulocytes: 0.03 10*3/uL (ref 0.00–0.07)
Basophils Absolute: 0.1 10*3/uL (ref 0.0–0.1)
Basophils Relative: 1 %
Eosinophils Absolute: 0.4 10*3/uL (ref 0.0–0.5)
Eosinophils Relative: 6 %
HCT: 41.5 % (ref 36.0–46.0)
Hemoglobin: 12.8 g/dL (ref 12.0–15.0)
Immature Granulocytes: 0 %
Lymphocytes Relative: 29 %
Lymphs Abs: 2.1 10*3/uL (ref 0.7–4.0)
MCH: 30.1 pg (ref 26.0–34.0)
MCHC: 30.8 g/dL (ref 30.0–36.0)
MCV: 97.6 fL (ref 80.0–100.0)
Monocytes Absolute: 0.6 10*3/uL (ref 0.1–1.0)
Monocytes Relative: 8 %
Neutro Abs: 4.1 10*3/uL (ref 1.7–7.7)
Neutrophils Relative %: 56 %
Platelets: 175 10*3/uL (ref 150–400)
RBC: 4.25 MIL/uL (ref 3.87–5.11)
RDW: 14.2 % (ref 11.5–15.5)
WBC: 7.4 10*3/uL (ref 4.0–10.5)
nRBC: 0 % (ref 0.0–0.2)

## 2019-09-09 NOTE — Progress Notes (Signed)
[facility] [address], [place] [address]   CLINIC:  Medical Oncology/Hematology  CONSULT NOTE  Patient Care Team: Martinique, Mozetta G, MD as PCP - General (Family Medicine) Troy Sine, MD as PCP - Cardiology (Cardiology) Awanda Mink Craige Cotta, RN as Oncology Nurse Navigator (Oncology) Eli Hose, [facility] (Inactive) as Pharmacist (Pharmacist) Dishmon, Garwin Brothers, RN as Oncology Nurse Navigator (Oncology)  CHIEF COMPLAINTS/PURPOSE OF CONSULTATION:  Evaluation of right ovarian epithelial cancer  HISTORY OF PRESENTING ILLNESS:  [name] [facility] 74 y.o. female is here because of evaluation of right ovarian epithelial cancer, at the request of Dr. Everitt Amber from [facility]. She was treated with carboplatin and paclitaxel for 5 cycles from 01/18/2018 to 05/31/2018; bevacizumab and doxorubicin for 5 cycles from 07/16/2018 to 12/03/2018; and with gemcitabine for 3 cycles from 04/15/2019 to 06/13/2019.  Today she is accompanied by her husband and daughter. She denies having any pain at the moment. Since her last treatment in June she has been feeling well with good energy levels and good appetite; she reports having some numbness and tingling in her right hand since the treatments, but the gabapentin has helped control it. She reports having intermittent abdominal bloating but denies early satiety. She is not enrolled in hospice yet.  Her niece had breast cancer. She used to work in a Education administrator. She continues smoking 1/2 PPD of cigars but is considering stopping smoking.  MEDICAL HISTORY:  Past Medical History:  Diagnosis Date  . Allergy    SEASONAL  . Anxiety   . Cataract    BILATERAL  . Dysrhythmia   . Family history of lung cancer   . Family history of prostate cancer   . Family history of prostate cancer   . Family history of uterine cancer   . Fibromyalgia   . GERD (gastroesophageal reflux disease)   . H/O echocardiogram 04/28/08   EF>55% trace mitral  regurgitation, No significant valvular pathology  . Hemorrhoids    internal  . History of stress test 02/08/2011   Normal Myocardial perfusion study, this is a low risk scan, No prior study available for comparison  . Hyperlipidemia   . Hypertension    hx of  . Hypothyroidism   . MVP (mitral valve prolapse)    mild and MR  . OA (osteoarthritis)   . Osteoporosis   . ovarian ca dx'd 12/2017   ovarian cancer  . Paroxysmal A-fib ([place])   . Sleep apnea    does not use prescribed CPAP  does not tolerate  . Thyroid disease    HYPO  . Varicosities of leg   . Vitamin D deficiency     SURGICAL HISTORY: Past Surgical History:  Procedure Laterality Date  . ABDOMINAL HYSTERECTOMY  1989  . APPENDECTOMY  1989   with hysterectomy  . BILATERAL SALPINGECTOMY N/A 03/27/2018   Procedure: EXPLORATORY LAPARATOMY, BILATERAL SALPINGOOPHERECTOMY;  Surgeon: Everitt Amber, MD;  Location: [facility];  Service: Gynecology;  Laterality: N/A;  . BREAST EXCISIONAL BIOPSY Left 1995   Benign  . COLONOSCOPY  11-20-2000  . DEBULKING N/A 03/27/2018   Procedure: RADICAL TUMOR DEBULKING;  Surgeon: Everitt Amber, MD;  Location: [facility];  Service: Gynecology;  Laterality: N/A;  . IR IMAGING GUIDED PORT INSERTION  01/17/2018  . NOSE SURGERY  1990  . OMENTECTOMY N/A 03/27/2018   Procedure: OMENTECTOMY;  Surgeon: Everitt Amber, MD;  Location: [facility];  Service: Gynecology;  Laterality: N/A;    SOCIAL HISTORY:  Social History   Socioeconomic History  . Marital status: Married    Spouse name: Not on file  . Number of children: 3  . Years of education: Not on file  . Highest education level: Not on file  Occupational History  . Occupation: retired  Tobacco Use  . Smoking status: Light Tobacco Smoker    Years: 10.00    Types: Cigars  . Smokeless tobacco: Never Used  . Tobacco comment: not daily  [place] cigars 1-2   Vaping Use  . Vaping Use: Never used  Substance and Sexual Activity  . Alcohol use: No    Alcohol/week:  0.0 standard drinks  . Drug use: No  . Sexual activity: Not Currently    Comment: 1st intercourse 17yo-1 partner  Other Topics Concern  . Not on file  Social History Narrative  . Not on file   Social Determinants of Health   Financial Resource Strain: Low Risk   . Difficulty of Paying Living Expenses: Not hard at all  Food Insecurity: No Food Insecurity  . Worried About Programme researcher, broadcasting/film/video in the Last Year: Never true  . Ran Out of Food in the Last Year: Never true  Transportation Needs: No Transportation Needs  . Lack of Transportation (Medical): No  . Lack of Transportation (Non-Medical): No  Physical Activity: Inactive  . Days of Exercise per Week: 0 days  . Minutes of Exercise per Session: 0 min  Stress: No Stress Concern Present  . Feeling of Stress : Not at all  Social Connections: Moderately Integrated  . Frequency of Communication with Friends and Family: More than three times a week  . Frequency of Social Gatherings with Friends and Family: Three times a week  . Attends Religious Services: More than 4 times per year  . Active Member of Clubs or Organizations: No  . Attends Banker Meetings: Never  . Marital Status: Married  Catering manager Violence: Not At Risk  . Fear of Current or Ex-Partner: No  . Emotionally Abused: No  . Physically Abused: No  . Sexually Abused: No    FAMILY HISTORY: Family History  Problem Relation Age of Onset  . Diabetes Daughter   . Psoriasis Daughter   . Heart defect Daughter   . Diabetes Mother   . Hypertension Mother   . Heart disease Mother   . Stroke Maternal Grandmother   . Heart attack Brother   . Heart attack Brother   . Prostatitis Brother   . Lung cancer Niece   . Cancer Paternal Aunt        type of cancer unk  . Prostate cancer Paternal Uncle   . Uterine cancer Cousin        dx under 50  . Colon cancer Neg Hx     ALLERGIES:  is allergic to tramadol hcl.  MEDICATIONS:  Current Outpatient  Medications  Medication Sig Dispense Refill  . alum & mag hydroxide-simeth (MAALOX/MYLANTA) 200-200-20 MG/5ML suspension Take 30 mLs by mouth every 6 (six) hours as needed for indigestion or heartburn. 355 mL 0  . Artificial Saliva (SALIVASURE) LOZG Use as directed 1 lozenge in the mouth or throat every 4 (four) hours as needed. 90 lozenge 0  . Calcium Carbonate (CALTRATE 600) 1500 MG TABS Take 600 mg of elemental calcium by mouth daily.     . cholecalciferol (VITAMIN D) 1000 UNITS tablet Take 1,000 Units by mouth daily.     Marland Kitchen estradiol (ESTRACE) 0.1 MG/GM vaginal cream  Place 1 Applicatorful vaginally 3 (three) times a week. 42.5 g 12  . fluticasone (CUTIVATE) 0.05 % cream APPLY CREAM TOPICALLY UP TO TWICE DAILY AS NEEDED FOR RASH    . fluticasone (FLONASE) 50 MCG/ACT nasal spray Place 2 sprays into both nostrils daily. 48 g 2  . gabapentin (NEURONTIN) 300 MG capsule Take 1 capsule (300 mg total) by mouth 2 (two) times daily. 60 capsule 3  . HYDROmorphone (DILAUDID) 2 MG tablet Take 1 tablet (2 mg total) by mouth daily as needed for severe pain. 30 tablet 0  . lactose free nutrition (BOOST PLUS) LIQD Take 237 mLs by mouth daily. (Patient taking differently: Take 237 mLs by mouth 2 (two) times daily between meals. ) 10 Can 0  . levothyroxine (SYNTHROID) 75 MCG tablet Take 1 tablet by mouth every day 90 tablet 1  . Magnesium 400 MG TABS Take 250 mg by mouth daily.    . Menthol-Methyl Salicylate (MUSCLE RUB) 10-15 % CREA Apply 1 application topically as needed for muscle pain.    . metoprolol succinate (TOPROL-XL) 50 MG 24 hr tablet Take 1 tablet (50 mg total) by mouth daily. Take with or immediately following a meal. 90 tablet 2  . Multiple Vitamins-Minerals (MULTIVITAMIN WITH MINERALS) tablet Take 1 tablet by mouth daily.      . Omega-3 Fatty Acids (FISH OIL) 1000 MG CAPS Take 2,000 mg by mouth 2 (two) times daily.     Marland Kitchen omeprazole (PRILOSEC) 40 MG capsule Take 1 capsule (40 mg total) by mouth  daily. 90 capsule 3  . polyethylene glycol (MIRALAX / GLYCOLAX) packet Take 17 g by mouth daily. 14 each 0  . senna (SENOKOT) 8.6 MG TABS tablet Take 1 tablet (8.6 mg total) by mouth at bedtime as needed for mild constipation. 120 each 0  . simvastatin (ZOCOR) 20 MG tablet Take 1 tablet (20 mg total) by mouth at bedtime. 90 tablet 1  . tretinoin (RETIN-A) 0.025 % cream APPLY TOPICALLY TO FACE AT BEDTIME AS NEEDED    . vitamin B-12 (CYANOCOBALAMIN) 1000 MCG tablet Take 1,000 mcg by mouth daily.    . vitamin C (ASCORBIC ACID) 500 MG tablet Take 1,000 mg by mouth daily.    . vitamin E 45 MG (100 UNITS) capsule Take by mouth daily.    [name] 20 MG TABS tablet TAKE 1 TABLET BY MOUTH ONCE DAILY WITH SUPPER 90 tablet 2  . acetaminophen (TYLENOL) 500 MG tablet Take 2 tablets (1,000 mg total) by mouth every 6 (six) hours. (Patient not taking: Reported on 09/09/2019) 30 tablet 0  . ALPRAZolam (XANAX) 0.5 MG tablet TAKE 1/2 TO 1 (ONE-HALF TO ONE) TABLET BY MOUTH ONCE DAILY AS NEEDED FOR ANXIETY (Patient not taking: Reported on 09/09/2019) 30 tablet 2  . lidocaine-prilocaine (EMLA) cream Apply to affected area once (Patient not taking: Reported on 09/09/2019) 30 g 3  . ondansetron (ZOFRAN) 8 MG tablet Take 1 tablet (8 mg total) by mouth every 8 (eight) hours as needed (Nausea or vomiting). (Patient not taking: Reported on 09/09/2019) 30 tablet 1  . prochlorperazine (COMPAZINE) 10 MG tablet Take 1 tablet (10 mg total) by mouth every 6 (six) hours as needed (Nausea or vomiting). (Patient not taking: Reported on 09/09/2019) 30 tablet 1   No current facility-administered medications for this visit.    REVIEW OF SYSTEMS:   Review of Systems  Constitutional: Negative for appetite change and fatigue.  Gastrointestinal: Positive for abdominal distention (occasional).  Musculoskeletal: Negative for arthralgias  and myalgias.  Hematological: Positive for adenopathy (swollen lymph node in R axilla).    Psychiatric/Behavioral: The patient is nervous/anxious.   All other systems reviewed and are negative.    PHYSICAL EXAMINATION: ECOG PERFORMANCE STATUS: 1 - Symptomatic but completely ambulatory  Vitals:   09/09/19 0852  BP: 133/84  Pulse: (!) 49  Resp: 16  Temp: 97.9 F (36.6 C)  SpO2: 98%   Filed Weights   09/09/19 0852  Weight: 147 lb 3.2 oz (66.8 kg)   Physical Exam Vitals reviewed.  Constitutional:      Appearance: Normal appearance.  Cardiovascular:     Rate and Rhythm: Normal rate and regular rhythm.     Pulses: Normal pulses.     Heart sounds: Normal heart sounds.  Pulmonary:     Effort: Pulmonary effort is normal.     Breath sounds: Normal breath sounds.  Chest:     Comments: Port-a-Cath in R chest Abdominal:     Palpations: Abdomen is soft.     Tenderness: There is no abdominal tenderness.  Musculoskeletal:     Right lower leg: No edema.     Left lower leg: No edema.  Lymphadenopathy:     Cervical: No cervical adenopathy.     Upper Body:     Right upper body: Axillary adenopathy (2 cm, tender) present. No supraclavicular adenopathy.     Left upper body: Axillary adenopathy (3-4 cm, nontender) present. No supraclavicular adenopathy.  Neurological:     General: No focal deficit present.     Mental Status: She is alert and oriented to person, place, and time.  Psychiatric:        Mood and Affect: Mood normal.        Behavior: Behavior normal.      LABORATORY DATA:  I have reviewed the data as listed CBC Latest Ref Rng & Units 06/13/2019 06/03/2019 05/13/2019  WBC 4.0 - 10.5 K/uL 6.5 6.1 4.0  Hemoglobin 12.0 - 15.0 g/dL 10.5(L) 10.9(L) 10.5(L)  Hematocrit 36 - 46 % 32.1(L) 35.0(L) 33.0(L)  Platelets 150 - 400 K/uL 89(L) 227 155   CMP Latest Ref Rng & Units 06/13/2019 06/03/2019 05/13/2019  Glucose 70 - 99 mg/dL 96 113(H) 90  BUN 8 - 23 mg/dL 11 14 7(L)  Creatinine 0.44 - 1.00 mg/dL 0.81 0.81 0.74  Sodium 135 - 145 mmol/L 141 141 142  Potassium 3.5 -  5.1 mmol/L 4.0 3.8 3.6  Chloride 98 - 111 mmol/L 108 107 106  CO2 22 - 32 mmol/L $RemoveB'25 27 27  'MebwXSzP$ Calcium 8.9 - 10.3 mg/dL 9.2 8.9 9.3  Total Protein 6.5 - 8.1 g/dL 6.3(L) 6.8 6.7  Total Bilirubin 0.3 - 1.2 mg/dL 0.3 0.4 0.4  Alkaline Phos 38 - 126 U/L 80 65 82  AST 15 - 41 U/L 15 13(L) 15  ALT 0 - 44 U/L $Remo'10 9 11    'DSdWL$ RADIOGRAPHIC STUDIES: I have personally reviewed the radiological images as listed and agreed with the findings in the report. No results found.  ASSESSMENT:  1.  Platinum resistant high-grade serous ovarian carcinoma: -Neoadjuvant chemotherapy with 3 cycles of carboplatin and paclitaxel from 01/18/2018 through 03/02/2018 presentation as stage IIIc -03/27/2018-BSO,omentectomy with pathology showing high-grade serous carcinoma, PT3CP NX followed by 3 more cycles of carboplatin and paclitaxel from 04/20/2018 through 05/31/2018. -5 cycles of Doxil and Avastin from 07/16/2018 through 11/14/2018 -Left axillary lymph node biopsy on 04/03/2019 consistent with high-grade serous carcinoma. -Third line therapy with gemcitabine 3 cycles from 04/15/2019 through 06/03/2019. -  CT scan of the AP on 06/21/2019 shows bulky left axillary adenopathy greater than the right axillary adenopathy increasing in size, developing nodularity and/or small lymph nodes along the left hemidiaphragm in the inferior prepericardial fat.  Stable small lymph nodes throughout the retroperitoneum. -She was seen by Drs. Alvy Bimler and Dr. Denman George and was given palliative options including chemotherapy with topotecan, docetaxel and pembrolizumab. -PD-L1 CPS score is 6%. -Germline and somatic mutation testing negative on 04/11/2018.  2.  Social/family history: -Patient smokes half pack per day of cigarettes. -Family history significant for maternal niece with breast cancer.    PLAN:  1.  Platinum resistant high-grade serous ovarian carcinoma: -Patient finished last gemcitabine chemotherapy in May.  She reports that she has been doing  very well without any abdominal pain or cramping. -Physical exam today showed bilateral axillary adenopathy, left more than right. -I agree with the recommendations from Dr. Denman George and Dr. Alvy Bimler. -She is in great performance status.  I have recommended chemotherapy with topotecan.  I quoted response rates of around 20% and stable disease of about 40%.  However her response rates would be less given platinum refractory disease.  She would like to discuss with her nurse and let us know in 2 weeks. -We will obtain baseline CT CAP along with CA-125 levels. -I have also recommended foundation 1 testing for detection of NTRK gene fusion.  All questions were answered. The patient knows to call the clinic with any problems, questions or concerns.   Derek Jack, MD, 09/09/19 8:56 AM  [place] [phone]   I, Milinda Antis, am acting as a scribe for Dr. Sanda Linger.  I, Derek Jack MD, have reviewed the above documentation for accuracy and completeness, and I agree with the above.

## 2019-09-09 NOTE — Addendum Note (Signed)
Addended by: Martinique, Masaye G on: 09/09/2019 01:12 PM   Modules accepted: Level of Service

## 2019-09-09 NOTE — Patient Instructions (Signed)
[place] at [facility] Discharge Instructions  You were seen and examined today by Dr. Delton Coombes. Dr. Delton Coombes is a medical oncologist, meaning he specializes in medical management of cancer. Dr. Delton Coombes discussed your past medical history, family history of cancer and current functional status.  You have high-grade serous ovarian cancer. Your cancer has spread to the lymph nodes under the arms, your cancer is not curable because it has metastasized. You cancer can be controlled from spreading further.  Chemotherapy (Topotecan) can allow for growth control. There is a 60% chance this will help, meaning the cancer will either remain the same size (40% chance) or slightly shrink (20% chance).  Should you choose to pursue treatment, Dr. Delton Coombes would recommend a baseline scan.  Dr. Delton Coombes will order Foundation testing - the biopsy tissue is sent for additional testing. This tests for NTRK genetic mutation, if this is positive, there would be an option further down the road. There is a 5% chance that you have this mutation. If this mutation is present, there is an option for a pill after chemotherapy.  Dr. Delton Coombes has recommended Topotecan. It is given for days 1 through 5 every 21 days. This drug has been on the market for decades and is used in the second and third line of treatment for ovarian cancer. You have not had this medication in the past. This is only one single medication, it is not a combination drug. Topotecan causes fatigue and decrease in blood counts - this is typical of all chemotherapy. You should continue your daily activities to combat fatigue. We monitor your blood counts regularly. 3 months after the initiation of chemotherapy, Dr. Delton Coombes would evaluate tumor response.   We will check your CA125 (tumor marker) today. We will schedule you for a CT scan of your chest, abdomen and pelvis to have a baseline.  You will return to the clinic  in a few weeks.  Thank you for choosing [place] at [facility] to provide your oncology and hematology care.  To afford each patient quality time with our provider, please arrive at least 15 minutes before your scheduled appointment time.   If you have a lab appointment with the [facility] please come in thru the Main Entrance and check in at the main information desk.  You need to re-schedule your appointment should you arrive 10 or more minutes late.  We strive to give you quality time with our providers, and arriving late affects you and other patients whose appointments are after yours.  Also, if you no show three or more times for appointments you may be dismissed from the clinic at the providers discretion.     Again, thank you for choosing [facility].  Our hope is that these requests will decrease the amount of time that you wait before being seen by our physicians.       _____________________________________________________________  Should you have questions after your visit to [facility], please contact our office at [phone] and follow the prompts.  Our office hours are 8:00 a.m. and 4:30 p.m. Monday - Friday.  Please note that voicemails left after 4:00 p.m. may not be returned until the following business day.  We are closed weekends and major holidays.  You do have access to a nurse 24-7, just call the main number to the clinic [phone] and do not press any options, hold on the line and a nurse will  answer the phone.    For prescription refill requests, have your pharmacy contact our office and allow 72 hours.    Due to Covid, you will need to wear a mask upon entering the hospital. If you do not have a mask, a mask will be given to you at the Main Entrance upon arrival. For doctor visits, patients may have 1 support person age 70 or older with them. For treatment visits, patients can not have anyone with them due to social  distancing guidelines and our immunocompromised population.

## 2019-09-10 ENCOUNTER — Other Ambulatory Visit: Payer: Self-pay

## 2019-09-10 ENCOUNTER — Telehealth: Payer: Self-pay | Admitting: Family Medicine

## 2019-09-10 ENCOUNTER — Ambulatory Visit: Payer: PPO | Admitting: Family Medicine

## 2019-09-10 LAB — CA 125: Cancer Antigen (CA) 125: 768 U/mL — ABNORMAL HIGH (ref 0.0–38.1)

## 2019-09-10 NOTE — Telephone Encounter (Signed)
fyi

## 2019-09-10 NOTE — Telephone Encounter (Signed)
Janine RN from [facility] [phone]  Called to let Dr. Martinique know that she is faxing over an order for [name].  The patient got a second opinion from Dr. Delton Coombes  The order will also ask if Dr. Martinique will want to be an attending provider to the patient.  Please advise

## 2019-09-11 ENCOUNTER — Encounter: Payer: Self-pay | Admitting: Family Medicine

## 2019-09-11 ENCOUNTER — Ambulatory Visit (INDEPENDENT_AMBULATORY_CARE_PROVIDER_SITE_OTHER): Payer: PPO | Admitting: Family Medicine

## 2019-09-11 ENCOUNTER — Telehealth: Payer: Self-pay | Admitting: Family Medicine

## 2019-09-11 VITALS — BP 128/80 | HR 83 | Resp 16 | Ht 62.0 in | Wt 145.0 lb

## 2019-09-11 DIAGNOSIS — I480 Paroxysmal atrial fibrillation: Secondary | ICD-10-CM

## 2019-09-11 DIAGNOSIS — I10 Essential (primary) hypertension: Secondary | ICD-10-CM | POA: Diagnosis not present

## 2019-09-11 DIAGNOSIS — C561 Malignant neoplasm of right ovary: Secondary | ICD-10-CM | POA: Diagnosis not present

## 2019-09-11 DIAGNOSIS — L602 Onychogryphosis: Secondary | ICD-10-CM | POA: Diagnosis not present

## 2019-09-11 DIAGNOSIS — R600 Localized edema: Secondary | ICD-10-CM | POA: Diagnosis not present

## 2019-09-11 NOTE — Telephone Encounter (Addendum)
Haley [name] called to verify we received the fax. Check the red folder up front and it was received. She stated she was going to follow up with the pt today and hoping to give her an update about her care once the form is signed and sent back. Informed her that the PCP will review it as soon as she has a moment and they will either reach out or fax it back. Sending as [name]

## 2019-09-11 NOTE — Progress Notes (Signed)
Chief Complaint  Patient presents with  . ankle swelling    more on the left than the right, spot on lower left leg she wants pcp to look at   HPI: [name] is a 74 y.o. female with hx of paroxysmal atrial fib on chronic anticoagulation,HLD,tobacco use, and metastatic ovarian cancer. who is here today with above complaint. She noted problem about a month ago, L>R, peri-ankle edema. She has not identified exacerbating or alleviating factors. No associated ankle pain,erythema,or limitation of ROM.  For years she has had LE's achy like pain, unchanged. She has compression stocking,she is not wearing them because hot weather but they helped.  She has not tried OTC medications. Negative for CP,palpitations, SOB,decreased urine output,gross hematuria,or foam in urine. No orthopnea or PND.  Also c/o deformed toenails. This has been going on for a while. Slow growth. No pain or periungual edema/erythema.  -She would like to have a discussion about the latest in regard to treatment for ovarian cancer.  She met with a new oncologist for a second opinion. According to pt, she was offered new chemo treatment x 5 days every month for 3 months.40% probability of stability and 20% possibility of some :shinking" of lesions. Abdominal CT has been arranged and has a f/u appt on 10/07/19.  She is not sure about what she wants to do. She is afraid of possible side effects but states that she was told there are not many side effects from chemo except for fatigue. She already has residual neuropathy from one of chemo agents used and also reporting some cardiac side effects. She is on Gabapentin, which is helping.  She is noticing growth of axillary lymph node, some tenderness. In general she still feels "good."  She met with hospice team yesterday, she has not made a decision and asked me not to sign orders yet. She was told she could stop some of her medications, this upset her. She  is on Metoprolol succinate 50 mg daily. HLD on Simvastatin 20 mg daily. Atrial fib on Xarelto 20 mg daily. Levothyroxine 75 mg daily for hypothyroidism.  States that she meet with an "educator" and she was recommended not to try chemo again due to possible side effects, including CV that may affect quality of life.  Review of Systems  Constitutional: Negative for activity change, appetite change and fever.  HENT: Negative for mouth sores, nosebleeds and sore throat.   Eyes: Negative for redness and visual disturbance.  Respiratory: Negative for cough and wheezing.   Gastrointestinal: Negative for abdominal pain, nausea and vomiting.       Negative for changes in bowel habits.  Genitourinary: Negative for decreased urine volume.  Musculoskeletal: Negative for gait problem.  Neurological: Negative for syncope, weakness and headaches.  Psychiatric/Behavioral: Negative for confusion. The patient is nervous/anxious.   Rest see pertinent positives and negatives per HPI.  Current Outpatient Medications on File Prior to Visit  Medication Sig Dispense Refill  . ALPRAZolam (XANAX) 0.5 MG tablet TAKE 1/2 TO 1 (ONE-HALF TO ONE) TABLET BY MOUTH ONCE DAILY AS NEEDED FOR ANXIETY 30 tablet 2  . alum & mag hydroxide-simeth (MAALOX/MYLANTA) 200-200-20 MG/5ML suspension Take 30 mLs by mouth every 6 (six) hours as needed for indigestion or heartburn. 355 mL 0  . Artificial Saliva (SALIVASURE) LOZG Use as directed 1 lozenge in the mouth or throat every 4 (four) hours as needed. 90 lozenge 0  . Calcium Carbonate (CALTRATE 600) 1500 MG TABS Take 600  mg of elemental calcium by mouth daily.     . cholecalciferol (VITAMIN D) 1000 UNITS tablet Take 1,000 Units by mouth daily.     Marland Kitchen estradiol (ESTRACE) 0.1 MG/GM vaginal cream Place 1 Applicatorful vaginally 3 (three) times a week. 42.5 g 12  . fluticasone (CUTIVATE) 0.05 % cream APPLY CREAM TOPICALLY UP TO TWICE DAILY AS NEEDED FOR RASH    . fluticasone (FLONASE)  50 MCG/ACT nasal spray Place 2 sprays into both nostrils daily. 48 g 2  . gabapentin (NEURONTIN) 300 MG capsule Take 1 capsule (300 mg total) by mouth 2 (two) times daily. 60 capsule 3  . HYDROmorphone (DILAUDID) 2 MG tablet Take 1 tablet (2 mg total) by mouth daily as needed for severe pain. 30 tablet 0  . lactose free nutrition (BOOST PLUS) LIQD Take 237 mLs by mouth daily. (Patient taking differently: Take 237 mLs by mouth 2 (two) times daily between meals. ) 10 Can 0  . levothyroxine (SYNTHROID) 75 MCG tablet Take 1 tablet by mouth every day 90 tablet 1  . lidocaine-prilocaine (EMLA) cream Apply to affected area once 30 g 3  . Magnesium 400 MG TABS Take 250 mg by mouth daily.    . Menthol-Methyl Salicylate (MUSCLE RUB) 10-15 % CREA Apply 1 application topically as needed for muscle pain.    . metoprolol succinate (TOPROL-XL) 50 MG 24 hr tablet Take 1 tablet (50 mg total) by mouth daily. Take with or immediately following a meal. 90 tablet 2  . Multiple Vitamins-Minerals (MULTIVITAMIN WITH MINERALS) tablet Take 1 tablet by mouth daily.      . Omega-3 Fatty Acids (FISH OIL) 1000 MG CAPS Take 2,000 mg by mouth 2 (two) times daily.     Marland Kitchen omeprazole (PRILOSEC) 40 MG capsule Take 1 capsule (40 mg total) by mouth daily. 90 capsule 3  . polyethylene glycol (MIRALAX / GLYCOLAX) packet Take 17 g by mouth daily. 14 each 0  . senna (SENOKOT) 8.6 MG TABS tablet Take 1 tablet (8.6 mg total) by mouth at bedtime as needed for mild constipation. 120 each 0  . simvastatin (ZOCOR) 20 MG tablet Take 1 tablet (20 mg total) by mouth at bedtime. 90 tablet 1  . tretinoin (RETIN-A) 0.025 % cream APPLY TOPICALLY TO FACE AT BEDTIME AS NEEDED    . vitamin B-12 (CYANOCOBALAMIN) 1000 MCG tablet Take 1,000 mcg by mouth daily.    . vitamin C (ASCORBIC ACID) 500 MG tablet Take 1,000 mg by mouth daily.    . vitamin E 45 MG (100 UNITS) capsule Take by mouth daily.    [name] 20 MG TABS tablet TAKE 1 TABLET BY MOUTH ONCE DAILY  WITH SUPPER 90 tablet 2   No current facility-administered medications on file prior to visit.     Past Medical History:  Diagnosis Date  . Allergy    SEASONAL  . Anxiety   . Cataract    BILATERAL  . Dysrhythmia   . Family history of lung cancer   . Family history of prostate cancer   . Family history of prostate cancer   . Family history of uterine cancer   . Fibromyalgia   . GERD (gastroesophageal reflux disease)   . H/O echocardiogram 04/28/08   EF>55% trace mitral regurgitation, No significant valvular pathology  . Hemorrhoids    internal  . History of stress test 02/08/2011   Normal Myocardial perfusion study, this is a low risk scan, No prior study available for comparison  . Hyperlipidemia   .  Hypertension    hx of  . Hypothyroidism   . MVP (mitral valve prolapse)    mild and MR  . OA (osteoarthritis)   . Osteoporosis   . ovarian ca dx'd 12/2017   ovarian cancer  . Paroxysmal A-fib (HCC)   . Sleep apnea    does not use prescribed CPAP  does not tolerate  . Thyroid disease    HYPO  . Varicosities of leg   . Vitamin D deficiency    Allergies  Allergen Reactions  . Tramadol Hcl     jittery    Social History   Socioeconomic History  . Marital status: Married    Spouse name: Not on file  . Number of children: 3  . Years of education: Not on file  . Highest education level: Not on file  Occupational History  . Occupation: retired  Tobacco Use  . Smoking status: Light Tobacco Smoker    Years: 10.00    Types: Cigars  . Smokeless tobacco: Never Used  . Tobacco comment: not daily  [place] cigars 1-2   Vaping Use  . Vaping Use: Never used  Substance and Sexual Activity  . Alcohol use: No    Alcohol/week: 0.0 standard drinks  . Drug use: No  . Sexual activity: Not Currently    Comment: 1st intercourse 17yo-1 partner  Other Topics Concern  . Not on file  Social History Narrative  . Not on file   Social Determinants of Health   Financial  Resource Strain: Low Risk   . Difficulty of Paying Living Expenses: Not hard at all  Food Insecurity: No Food Insecurity  . Worried About Programme researcher, broadcasting/film/video in the Last Year: Never true  . Ran Out of Food in the Last Year: Never true  Transportation Needs: No Transportation Needs  . Lack of Transportation (Medical): No  . Lack of Transportation (Non-Medical): No  Physical Activity: Inactive  . Days of Exercise per Week: 0 days  . Minutes of Exercise per Session: 0 min  Stress: No Stress Concern Present  . Feeling of Stress : Not at all  Social Connections: Moderately Integrated  . Frequency of Communication with Friends and Family: More than three times a week  . Frequency of Social Gatherings with Friends and Family: Three times a week  . Attends Religious Services: More than 4 times per year  . Active Member of Clubs or Organizations: No  . Attends Banker Meetings: Never  . Marital Status: Married    Vitals:   09/11/19 1422  BP: 128/80  Pulse: 83  Resp: 16  SpO2: 96%   Body mass index is 26.52 kg/m.  Physical Exam Vitals and nursing note reviewed.  Constitutional:      General: She is not in acute distress.    Appearance: She is well-developed.  HENT:     Head: Normocephalic and atraumatic.  Eyes:     Conjunctiva/sclera: Conjunctivae normal.  Cardiovascular:     Rate and Rhythm: Normal rate and regular rhythm.     Pulses:          Dorsalis pedis pulses are 2+ on the right side and 2+ on the left side.     Comments: Trace pitting edema LE, L>R and peri ankle. Pulmonary:     Effort: Pulmonary effort is normal. No respiratory distress.  Lymphadenopathy:     Cervical: No cervical adenopathy.  Skin:    General: Skin is warm.     Findings:  No erythema or rash.     Comments: Some toe nails are hypertrophic: Left great toe and 5th right toenail.Also 3rd right toenail long and right great toe nail with mild deformity.  Neurological:     Mental Status:  She is alert and oriented to person, place, and time.     Cranial Nerves: No cranial nerve deficit.     Gait: Gait normal.  Psychiatric:        Mood and Affect: Affect normal. Mood is anxious.     Comments: Well groomed, good eye contact.    ASSESSMENT AND PLAN:  Ms. Zandra was seen today for ankle swelling.  Diagnoses and all orders for this visit:  Bilateral lower extremity edema We discussed possible etiologies. I think it is related to vein disease, I do not think diuretic treatment is needed. She does not want to wear compression stocking because hot weather. She can elevate LE's a few times though the day. I do not think further work-up is needed at this time. Good skin care also discussed.  Right ovarian epithelial cancer ([place]) Long discussion today about prognosis, side effects of chemo agents, and quality of life. She would like to try chemo, hoping that chemo helps with survival but does not want to have to take treatment for long time and afraid of side effects.  Essential hypertension, benign Well controlled. Continue Metoprolol succinate 50 mg daily.  PAF (paroxysmal atrial fibrillation) (HCC) Rhythm and rate controlled. Continue Xarelto and Metoprolol.  Hypertrophic toenail She can apply Vicks vapor rub on toenails at night. Explained possible causes, reassured. She can soak feet in warm water with vinegar 1-2 times per week and loose cuticle genteelly.  Spent 45 minutes with pt. Time was dedicated to obtaining and documenting hx,performing examination,and discussion of plan. Topotecan side effects reviewed and handout given. She could consider stopping stain and vits. Explained that some medications need to be continue even if she starts hospice care. She is still debating between palliative treatment, which can deteriorate quality of life Vs comfort care. She wants to continue being full code.  Return if symptoms worsen or fail to improve.   Frona G.  Martinique, MD  [facility]. [place] office.  A few things to remember from today's visit:   Right ovarian epithelial cancer ([place])  Essential hypertension, benign  PAF (paroxysmal atrial fibrillation) ([place])  Based on our discussion today we need to make a decision about treatment. I am not going to stop any med, you could stop cholesterol but I know you want to continue it. Let me know what you decide.  If you need refills please call your pharmacy. Do not use My Chart to request refills or for acute issues that need immediate attention.    Please be sure medication list is accurate. If a new problem present, please set up appointment sooner than planned today.

## 2019-09-11 NOTE — Patient Instructions (Addendum)
A few things to remember from today's visit:   Right ovarian epithelial cancer ([place])  Essential hypertension, benign  PAF (paroxysmal atrial fibrillation) ([place])  Based on our discussion today we need to make a decision about treatment. I am not going to stop any med, you could stop cholesterol but I know you want to continue it. Let me know what you decide.  If you need refills please call your pharmacy. Do not use My Chart to request refills or for acute issues that need immediate attention.    Please be sure medication list is accurate. If a new problem present, please set up appointment sooner than planned today.

## 2019-09-11 NOTE — Telephone Encounter (Signed)
Sherlyn Hay is calling that if Dr. Martinique needs any assistance with filling out the forms that the pt is coming in with she can give her a call.  Sherlyn Hay is the RN that assist with transiting pts form palliative care to hospice.  They suggest that the provider to choose **PCP hospice with managing symptoms**.

## 2019-09-12 ENCOUNTER — Other Ambulatory Visit: Payer: Self-pay

## 2019-09-12 DIAGNOSIS — I480 Paroxysmal atrial fibrillation: Secondary | ICD-10-CM

## 2019-09-12 DIAGNOSIS — I10 Essential (primary) hypertension: Secondary | ICD-10-CM

## 2019-09-12 MED ORDER — METOPROLOL SUCCINATE ER 50 MG PO TB24: 50.0000 mg | ORAL_TABLET | Freq: Every day | ORAL | 1 refills | Status: DC

## 2019-09-18 ENCOUNTER — Ambulatory Visit: Payer: PPO | Admitting: Family Medicine

## 2019-09-26 ENCOUNTER — Encounter (HOSPITAL_COMMUNITY): Payer: Self-pay

## 2019-09-26 ENCOUNTER — Other Ambulatory Visit: Payer: Self-pay

## 2019-09-26 ENCOUNTER — Ambulatory Visit: Payer: PPO | Admitting: Cardiovascular Disease

## 2019-09-26 ENCOUNTER — Encounter: Payer: Self-pay | Admitting: Cardiovascular Disease

## 2019-09-26 VITALS — BP 152/72 | HR 51 | Ht 62.0 in | Wt 146.0 lb

## 2019-09-26 DIAGNOSIS — Z7901 Long term (current) use of anticoagulants: Secondary | ICD-10-CM

## 2019-09-26 DIAGNOSIS — E039 Hypothyroidism, unspecified: Secondary | ICD-10-CM | POA: Diagnosis not present

## 2019-09-26 DIAGNOSIS — I10 Essential (primary) hypertension: Secondary | ICD-10-CM | POA: Diagnosis not present

## 2019-09-26 DIAGNOSIS — I480 Paroxysmal atrial fibrillation: Secondary | ICD-10-CM | POA: Diagnosis not present

## 2019-09-26 DIAGNOSIS — C561 Malignant neoplasm of right ovary: Secondary | ICD-10-CM

## 2019-09-26 DIAGNOSIS — Z79899 Other long term (current) drug therapy: Secondary | ICD-10-CM | POA: Diagnosis not present

## 2019-09-26 DIAGNOSIS — I700 Atherosclerosis of aorta: Secondary | ICD-10-CM

## 2019-09-26 MED ORDER — LOSARTAN POTASSIUM 25 MG PO TABS
25.0000 mg | ORAL_TABLET | Freq: Every day | ORAL | 2 refills | Status: DC
Start: 2019-09-26 — End: 2019-09-30

## 2019-09-26 NOTE — Progress Notes (Signed)
Cardiology Office Note    Date:  09/28/2019   ID:  [place], [place] 1945-05-30, MRN [id number]  PCP:  Martin[place], Thula G, MD  Cardiologist:  Shelva Majestic, MD    History of Present Illness:  Haley [name] is a 74 y.o. female who resents for 5 -month cardiology follow-up evaluation.    [name] has a history of palpitations, paroxysmal atrial fibrillation, hyperlipidemia, GERD, as well as hypothyroidism. She also has a history of sleep apnea, currently untreated. Since I last saw her, she unfortunately was diagnosed with metastatic ovarian CA and has undergone extensive GYN and oncological evaluation.A preoperative chest CT did not show active cardiopulmonary diseaseand there was no evidence for large central pulmonary embolus. She was noted to have aortic atherosclerosis without aneurysm or dissection.On March 27, 2018 she underwent an exploratory laparotomy with bilateral salpingo-oophorectomy, omentectomy, radical tumor debulking for ovariancancer by Dr. Denman George.She is followed by Dr. Candida Peeling so far has had 2 cycles of chemotherapy. She admits to being fatigued and weak following the treatments but ultimately improves.Prior to her exploratory laparotomy surgery, she underwent a myocardial perfusion study which essentially was normal and did not show evidence for scar or ischemia. Ejection fraction was 57%. She was given operative clearance.  She was evaluated by me in a telemedicine visit on April 24, 2018.  At that time she was feeling fairly well and deniedany episodes of chest tightness or pressure. She does admit to some fatigue. She is unaware of any significant recent palpitations. She states her blood pressure typically was running in the 130/70 range. Her pulse typically is in the 60s to 70s. She denied presyncope or syncope or significant swelling. She admits to some abdominal discomfort. She also has a history of recent neuropathy.  I  saw her in a  telemedicine evaluation in August 2020.  She has been monitored by Dr. Alvy Bimler for her right ovarian epithelial cancer. She developed small petechial hemorrhages at the anterior shin area which were felt likely secondary to bevacizumab.  In addition, her blood pressure had begun to increase in hydrochlorothiazide was added to her medical regimen of amlodipine and metoprolol.  She does experience rare palpitations.  She was sleeping well but at times notes some palpitations in the early morning.  Remotely, she was diagnosed with sleep apnea and apparently did not tolerate CPAP therapy.  She denied any chest tightness.  She admitted to increased stress and at times this resulted in significant blood pressure lability.  She has been undergoing treatment by Dr. Alvy Bimler.  Apparently, the patient had self discontinued amlodipine.  An echo Doppler study of October 16, 2018 showed an EF of 60 to 65%.  The average left and a global longitudinal strain was -22.4.  Atrial dimensions were normal.  There was trace MR and mild TR.  There was mild aortic sclerosis without stenosis.  Estimated RV systolic pressure was 24.4 mmHg.    I saw her in November 2020.  She had been seen by Dr. Alvy Bimler 2 days previously and due to a serum sodium of 128 it was advised that she hold HCTZ as well as spironolactone.  She stopped taking spironolactone, but was unaware that she should hold HCTZ.  When I saw her, her blood pressure was elevated and I recommended resumption of a low-dose of amlodipine 5 mg for which she had tolerated in the past and  recommended to hold the HCTZ.  When she was seen in April 2021 by me she  felt well and denied any chest pain, dyspnea, or palpitations..  She was continuing to be followed closely by  Dr. Alvy Bimler.  She has been tolerating treatment of her right ovarian epithelial cancer with the exception of pancytopenia development.  Dr. Alvy Bimler has reduced the dose of her chemotherapy and has modified her  treatment plan.   Unfortunately, it appears that her cancer has become refractory to treatment.  Her cancer relapse soon after discontinuance of adjuvant carboplatin and Taxol.  She had numerous side effects with Doxil and Avastin.  She subsequently was started on gemcitabine but unfortunately CT imaging showed progression of disease.  There has been discussion with Dr. Alvy Bimler concerning discontinuance of treatment.  Patient sought another opinion and recently was evaluated by Dr. Delton Coombes and is considering a fourth line option with Topotecan but has not yet made a decision.  She has also recently seen Dr. Everitt Amber of GYN oncology.  Presently, [name] denies any chest pain.  She continues to smoke occasional small cigars.  She is unaware of any palpitations.  She has noticed her blood pressure recently being slightly elevated.  She denies shortness of breath.  She has had progressive axillary adenopathy left greater than right.  She presents for evaluation.  Past Medical History:  Diagnosis Date  . Allergy    SEASONAL  . Anxiety   . Cataract    BILATERAL  . Dysrhythmia   . Family history of lung cancer   . Family history of prostate cancer   . Family history of prostate cancer   . Family history of uterine cancer   . Fibromyalgia   . GERD (gastroesophageal reflux disease)   . H/O echocardiogram 04/28/08   EF>55% trace mitral regurgitation, No significant valvular pathology  . Hemorrhoids    internal  . History of stress test 02/08/2011   Normal Myocardial perfusion study, this is a low risk scan, No prior study available for comparison  . Hyperlipidemia   . Hypertension    hx of  . Hypothyroidism   . MVP (mitral valve prolapse)    mild and MR  . OA (osteoarthritis)   . Osteoporosis   . ovarian ca dx'd 12/2017   ovarian cancer  . Paroxysmal A-fib ([place])   . Sleep apnea    does not use prescribed CPAP  does not tolerate  . Thyroid disease    HYPO  . Varicosities of leg    . Vitamin D deficiency     Past Surgical History:  Procedure Laterality Date  . ABDOMINAL HYSTERECTOMY  1989  . APPENDECTOMY  1989   with hysterectomy  . BILATERAL SALPINGECTOMY N/A 03/27/2018   Procedure: EXPLORATORY LAPARATOMY, BILATERAL SALPINGOOPHERECTOMY;  Surgeon: Everitt Amber, MD;  Location: [facility];  Service: Gynecology;  Laterality: N/A;  . BREAST EXCISIONAL BIOPSY Left 1995   Benign  . COLONOSCOPY  11-20-2000  . DEBULKING N/A 03/27/2018   Procedure: RADICAL TUMOR DEBULKING;  Surgeon: Everitt Amber, MD;  Location: [facility];  Service: Gynecology;  Laterality: N/A;  . IR IMAGING GUIDED PORT INSERTION  01/17/2018  . NOSE SURGERY  1990  . OMENTECTOMY N/A 03/27/2018   Procedure: OMENTECTOMY;  Surgeon: Everitt Amber, MD;  Location: [facility];  Service: Gynecology;  Laterality: N/A;    Current Medications: Outpatient Medications Prior to Visit  Medication Sig Dispense Refill  . ALPRAZolam (XANAX) 0.5 MG tablet TAKE 1/2 TO 1 (ONE-HALF TO ONE) TABLET BY MOUTH ONCE DAILY AS NEEDED FOR ANXIETY 30 tablet 2  .  alum & mag hydroxide-simeth (MAALOX/MYLANTA) 200-200-20 MG/5ML suspension Take 30 mLs by mouth every 6 (six) hours as needed for indigestion or heartburn. 355 mL 0  . Artificial Saliva (SALIVASURE) LOZG Use as directed 1 lozenge in the mouth or throat every 4 (four) hours as needed. 90 lozenge 0  . Calcium Carbonate (CALTRATE 600) 1500 MG TABS Take 600 mg of elemental calcium by mouth daily.     . cholecalciferol (VITAMIN D) 1000 UNITS tablet Take 1,000 Units by mouth daily.     Marland Kitchen estradiol (ESTRACE) 0.1 MG/GM vaginal cream Place 1 Applicatorful vaginally 3 (three) times a week. 42.5 g 12  . fluticasone (CUTIVATE) 0.05 % cream APPLY CREAM TOPICALLY UP TO TWICE DAILY AS NEEDED FOR RASH    . fluticasone (FLONASE) 50 MCG/ACT nasal spray Place 2 sprays into both nostrils daily. 48 g 2  . gabapentin (NEURONTIN) 300 MG capsule Take 1 capsule (300 mg total) by mouth 2 (two) times daily. 60 capsule 3  .  HYDROmorphone (DILAUDID) 2 MG tablet Take 1 tablet (2 mg total) by mouth daily as needed for severe pain. 30 tablet 0  . lactose free nutrition (BOOST PLUS) LIQD Take 237 mLs by mouth daily. (Patient taking differently: Take 237 mLs by mouth 2 (two) times daily between meals. ) 10 Can 0  . levothyroxine (SYNTHROID) 75 MCG tablet Take 1 tablet by mouth every day 90 tablet 1  . lidocaine-prilocaine (EMLA) cream Apply to affected area once 30 g 3  . Magnesium 400 MG TABS Take 250 mg by mouth daily.    . Menthol-Methyl Salicylate (MUSCLE RUB) 10-15 % CREA Apply 1 application topically as needed for muscle pain.    . metoprolol succinate (TOPROL-XL) 50 MG 24 hr tablet Take 1 tablet (50 mg total) by mouth daily. Take with or immediately following a meal. 90 tablet 1  . Multiple Vitamins-Minerals (MULTIVITAMIN WITH MINERALS) tablet Take 1 tablet by mouth daily.      . Omega-3 Fatty Acids (FISH OIL) 1000 MG CAPS Take 2,000 mg by mouth 2 (two) times daily.     Marland Kitchen omeprazole (PRILOSEC) 40 MG capsule Take 1 capsule (40 mg total) by mouth daily. 90 capsule 3  . polyethylene glycol (MIRALAX / GLYCOLAX) packet Take 17 g by mouth daily. 14 each 0  . senna (SENOKOT) 8.6 MG TABS tablet Take 1 tablet (8.6 mg total) by mouth at bedtime as needed for mild constipation. 120 each 0  . simvastatin (ZOCOR) 20 MG tablet Take 1 tablet (20 mg total) by mouth at bedtime. 90 tablet 1  . tretinoin (RETIN-A) 0.025 % cream APPLY TOPICALLY TO FACE AT BEDTIME AS NEEDED    . vitamin B-12 (CYANOCOBALAMIN) 1000 MCG tablet Take 1,000 mcg by mouth daily.    . vitamin C (ASCORBIC ACID) 500 MG tablet Take 1,000 mg by mouth daily.    . vitamin E 45 MG (100 UNITS) capsule Take by mouth daily.    [name] 20 MG TABS tablet TAKE 1 TABLET BY MOUTH ONCE DAILY WITH SUPPER 90 tablet 2   No facility-administered medications prior to visit.     Allergies:   Tramadol hcl   Social History   Socioeconomic History  . Marital status: Married     Spouse name: Not on file  . Number of children: 3  . Years of education: Not on file  . Highest education level: Not on file  Occupational History  . Occupation: retired  Tobacco Use  . Smoking status:  Light Tobacco Smoker    Years: 10.00    Types: Cigars  . Smokeless tobacco: Never Used  . Tobacco comment: not daily  [place] cigars 1-2   Vaping Use  . Vaping Use: Never used  Substance and Sexual Activity  . Alcohol use: No    Alcohol/week: 0.0 standard drinks  . Drug use: No  . Sexual activity: Not Currently    Comment: 1st intercourse 74yo-1 partner  Other Topics Concern  . Not on file  Social History Narrative  . Not on file   Social Determinants of Health   Financial Resource Strain: Low Risk   . Difficulty of Paying Living Expenses: Not hard at all  Food Insecurity: No Food Insecurity  . Worried About Charity fundraiser in the Last Year: Never true  . Ran Out of Food in the Last Year: Never true  Transportation Needs: No Transportation Needs  . Lack of Transportation (Medical): No  . Lack of Transportation (Non-Medical): No  Physical Activity: Inactive  . Days of Exercise per Week: 0 days  . Minutes of Exercise per Session: 0 min  Stress: No Stress Concern Present  . Feeling of Stress : Not at all  Social Connections: Moderately Integrated  . Frequency of Communication with Friends and Family: More than three times a week  . Frequency of Social Gatherings with Friends and Family: Three times a week  . Attends Religious Services: More than 4 times per year  . Active Member of Clubs or Organizations: No  . Attends Archivist Meetings: Never  . Marital Status: Married     Family History:  The patient's family history includes Cancer in her paternal aunt; Diabetes in her daughter and mother; Heart attack in her brother and brother; Heart defect in her daughter; Heart disease in her mother; Hypertension in her mother; Lung cancer in her niece; Prostate  cancer in her paternal uncle; Prostatitis in her brother; Psoriasis in her daughter; Stroke in her maternal grandmother; Uterine cancer in her cousin.   ROS General: Negative; No fevers, chills, or night sweats;  HEENT: Negative; No changes in vision or hearing, sinus congestion, difficulty swallowing Pulmonary: Negative; No cough, wheezing, shortness of breath, hemoptysis Cardiovascular: Negative; No chest pain, presyncope, syncope, palpitations GI: Negative; No nausea, vomiting, diarrhea, or abdominal pain GU: Negative; No dysuria, hematuria, or difficulty voiding Musculoskeletal: Negative; no myalgias, joint pain, or weakness Hematologic/Oncology:   Metastatic right ovarian epithelial cancer, axillary lymphadenopathy, pancytopenia Endocrine: Negative; no heat/cold intolerance; no diabetes Neuro: Negative; no changes in balance, headaches Skin: Negative; No rashes or skin lesions Psychiatric: Negative; No behavioral problems, depression Sleep: Negative; No snoring, daytime sleepiness, hypersomnolence, bruxism, restless legs, hypnogognic hallucinations, no cataplexy Other comprehensive 14 point system review is negative.   PHYSICAL EXAM:   VS:  BP (!) 152/72   Pulse (!) 51   Ht $R'5\' 2"'oE$  (1.575 m)   Wt 146 lb (66.2 kg)   BMI 26.70 kg/m     Repeat blood pressure by me was 158/74  Wt Readings from Last 3 Encounters:  09/26/19 146 lb (66.2 kg)  09/11/19 145 lb (65.8 kg)  09/09/19 147 lb 3.2 oz (66.8 kg)    General: Alert, oriented, no distress.  Skin: normal turgor, no rashes, warm and dry HEENT: Normocephalic, atraumatic. Pupils equal round and reactive to light; sclera anicteric; extraocular muscles intact;  Nose without nasal septal hypertrophy Mouth/Parynx benign; Mallinpatti scale 3 Neck: No JVD, no carotid bruits; normal carotid upstroke Lungs:  clear to ausculatation and percussion; no wheezing or rales Chest wall: Portacatheter; without tenderness to palpitation Axillary  lymphadenopathy left greater than right Heart: PMI not displaced, RRR, s1 s2 normal, 1/6 systolic murmur, no diastolic murmur, no rubs, gallops, thrills, or heaves Abdomen: soft, nontender; no hepatosplenomehaly, BS+; abdominal aorta nontender and not dilated by palpation. Back: no CVA tenderness Pulses 2+ Musculoskeletal: full range of motion, normal strength, no joint deformities Extremities: no clubbing cyanosis or edema, Homan's sign negative  Neurologic: grossly nonfocal; Cranial nerves grossly wnl Psychologic: Normal mood and affect   Studies/Labs Reviewed:   ECG (independently read by me): Sinus bradycardia at 51 with mild ainua arrythmia, QS V1-2  April 2021ECG (independently read by me): Sinus bradycardia at 56 bpm.  QS V1 V2.  No ectopy.  Normal intervals.  November 2020 ECG (independently read by me): Sinus bradycardia at 49 bpm.  Left axis deviation.  QS complex V1 V2.  March 08, 2018 ECG (independently read by me): Sinus bradycardia 59 bpm, QS complex anteroseptally  Recent Labs: BMP Latest Ref Rng & Units 09/09/2019 06/13/2019 06/03/2019  Glucose 70 - 99 mg/dL 98 96 113(H)  BUN 8 - 23 mg/dL _0 Creatinine 0.44 - 1.00 mg/dL 0.71 0.81 0.81  Sodium 135 - 145 mmol/L 137 141 141  Potassium 3.5 - 5.1 mmol/L 4.3 4.0 3.8  Chloride 98 - 111 mmol/L 105 108 107  CO2 22 - 32 mmol/L _1 Calcium 8.9 - 10.3 mg/dL 8.6(L) 9.2 8.9     Hepatic Function Latest Ref Rng & Units 09/09/2019 06/13/2019 06/03/2019  Total Protein 6.5 - 8.1 g/dL 7.5 6.3(L) 6.8  Albumin 3.5 - 5.0 g/dL 3.8 3.3(L) 3.4(L)  AST 15 - 41 U/L 14(L) 15 13(L)  ALT 0 - 44 U/L _2 Alk Phosphatase 38 - 126 U/L 71 80 65  Total Bilirubin 0.3 - 1.2 mg/dL 0.4 0.3 0.4  Bilirubin, Direct 0.0 - 0.3 mg/dL - - -    CBC Latest Ref Rng & Units 09/09/2019 06/13/2019 06/03/2019  WBC 4.0 - 10.5 K/uL 7.4 6.5 6.1  Hemoglobin 12.0 - 15.0 g/dL 12.8 10.5(L) 10.9(L)  Hematocrit 36 - 46 % 41.5 32.1(L) 35.0(L)  Platelets 150  - 400 K/uL 175 89(L) 227   Lab Results  Component Value Date   MCV 97.6 09/09/2019   MCV 98.2 06/13/2019   MCV 98.9 06/03/2019   Lab Results  Component Value Date   TSH 2.511 12/13/2018   Lab Results  Component Value Date   HGBA1C 6.0 08/06/2012     BNP No results found for: BNP  ProBNP No results found for: PROBNP   Lipid Panel     Component Value Date/Time   CHOL 157 11/03/2017 1019   TRIG 154.0 (H) 11/03/2017 1019   HDL 56.00 11/03/2017 1019   CHOLHDL 3 11/03/2017 1019   VLDL 30.8 11/03/2017 1019   LDLCALC 70 11/03/2017 1019   LDLDIRECT 84.3 12/16/2010 0921     RADIOLOGY: No results found.   Additional studies/ records that were reviewed today include:  07/11/2018 Echocardiogram   1. The left ventricle has normal systolic function with an ejection fraction of 60-65%. The cavity size was normal. Left ventricular diastolic parameters were normal. 2. Normal GLS -20.1. 3. The right ventricle has normal systolic function. The cavity was normal. There is no increase in right ventricular wall thickness. 4. The mitral valve is degenerative. Moderate thickening of the mitral valve leaflet. Moderate calcification of the  mitral valve leaflet. 5. The aortic valve is tricuspid. Moderate thickening of the aortic valve. Moderate calcification of the aortic valve. Aortic valve regurgitation is trivial by color flow Doppler. 6. The aortic root is normal in size and structure.     ECHO 10/16/2018 IMPRESSIONS  1. No significant change from prior study (07/11/2018).  2. Left ventricular ejection fraction, by visual estimation, is 60 to 65%. The left ventricle has normal function. Normal left ventricular size. There is no left ventricular hypertrophy.  3. LVEF by 3D assessment 63%.  4. The average left ventricular global longitudinal strain is -22.4 %.  5. Global right ventricle has normal systolic function.The right ventricular size is normal. No increase in right  ventricular wall thickness.  6. Left atrial size was normal.  7. Right atrial size was normal.  8. Presence of pericardial fat pad.  9. Moderate thickening of the mitral valve leaflet(s). 10. The mitral valve is normal in structure. Trace mitral valve regurgitation. 11. The tricuspid valve is normal in structure. Tricuspid valve regurgitation is mild. 12. Aortic regurgitation PHT measures 467 msec. 13. The aortic valve is tricuspid Aortic valve regurgitation is mild by color flow Doppler. Mild aortic valve sclerosis without stenosis. 14. There is mild to moderate calcification of the AoV, with focal calcification of the [place]. The aortic regurgitation is mild in severity, likely related to degenerative valve disease. 15. The pulmonic valve was grossly normal. Pulmonic valve regurgitation is not visualized by color flow Doppler. 16. Mild plaque invoving the ascending aorta. 17. Normal pulmonary artery systolic pressure. 18. The tricuspid regurgitant velocity is 2.52 m/s, and with an assumed right atrial pressure of 3 mmHg, the estimated right ventricular systolic pressure is normal at 28.4 mmHg.   ASSESSMENT:    1. Essential hypertension   2. PAF (paroxysmal atrial fibrillation) ([place])   3. Chronic anticoagulation   4. Medication management   5. Hypothyroidism, unspecified type   6. Right ovarian epithelial cancer ([place])   7. Aortic atherosclerosis ([place])     PLAN:  1.  Essential hypertension : Blood pressure today is elevated despite taking metoprolol succinate 50 mg daily.  She apparently is no longer taking amlodipine or diuretic.  I have recommended initiation of losartan and will start 25 mg daily.   2.  Palpitations: Resolved with metoprolol succinate.  She has sinus bradycardia, asymptomatic.  There is no recurrent atrial fibrillation.   3.  Metastatic right ovarian epithelial cancer: She is followed by Dr.Gorsuch.  She has become refractory to therapy and has developed pancytopenia  with progressive lymphadenopathy.  She is contemplating a fourth option.  I have recommended she undergo a follow-up echo Doppler study to reassess LV function following her several courses of chemotherapeutic regimens.  4.  Hyperlipidemia: She continues to be on simvastatin 20 mg daily as well as over-the-counter fish oil.  She has documented aortic atherosclerosis.  She has not had recent lipid studies done although her last in October 2019 demonstrated an LDL of 70.  I have recommended a follow-up chemistry profile as well as fasting lipids.    5.  Hypothyroidism: She continues to be on levothyroxine 75 mcg.  TSH level on December 13, 2018 was 2.51.  7.  Obstructive sleep apnea: Currently untreated.  She had moderate sleep apnea on diagnostic polysomnogram.  She did not tolerate CPAP.  Remotely I had discussed alternatives such as customized oral appliance.  Presently she believes she is sleeping well.  8.  Anticoagulated on Xarelto.  No recurrent atrial fibrillation.  Medication Adjustments/Labs and Tests Ordered: Current medicines are reviewed at length with the patient today.  Concerns regarding medicines are outlined above.  Medication changes, Labs and Tests ordered today are listed in the Patient Instructions below. Patient Instructions  Medication Instructions:  BEGIN TAKING LOSARTAN 25MG DAILY *If you need a refill on your cardiac medications before your next appointment, please call your pharmacy*   Lab Work: IN 2 WEEKS: FASTING LABS: CMET LIPID If you have labs (blood work) drawn today and your tests are completely normal, you will receive your results only by: Marland Kitchen MyChart Message (if you have MyChart) OR . A paper copy in the mail If you have any lab test that is abnormal or we need to change your treatment, we will call you to review the results. TESTS: Your physician has requested that you have an echocardiogram. Echocardiography is a painless test that uses sound waves to  create images of your heart. It provides your doctor with information about the size and shape of your heart and how well your heart's chambers and valves are working. This procedure takes approximately one hour. There are no restrictions for this procedure.    Follow-Up: At [facility], you and your health needs are our priority.  As part of our continuing mission to provide you with exceptional heart care, we have created designated Provider Care Teams.  These Care Teams include your primary Cardiologist (physician) and Advanced Practice Providers (APPs -  Physician Assistants and Nurse Practitioners) who all work together to provide you with the care you need, when you need it.  We recommend signing up for the patient portal called "MyChart".  Sign up information is provided on this After Visit Summary.  MyChart is used to connect with patients for Virtual Visits (Telemedicine).  Patients are able to view lab/test results, encounter notes, upcoming appointments, etc.  Non-urgent messages can be sent to your provider as well.   To learn more about what you can do with MyChart, go to [email or web address].    Your next appointment:   4 month(s)  The format for your next appointment:   In Person  Provider:   Shelva Majestic, MD        Signed, Shelva Majestic, MD  09/28/2019 3:34 PM    [facility] [address] [place], [place], [place]  [address] Phone: [phone]

## 2019-09-26 NOTE — Patient Instructions (Addendum)
Medication Instructions:  BEGIN TAKING LOSARTAN 25MG  DAILY *If you need a refill on your cardiac medications before your next appointment, please call your pharmacy*   Lab Work: IN 2 WEEKS: FASTING LABS: CMET LIPID If you have labs (blood work) drawn today and your tests are completely normal, you will receive your results only by:  [place] (if you have MyChart) OR  A paper copy in the mail If you have any lab test that is abnormal or we need to change your treatment, we will call you to review the results. TESTS: Your physician has requested that you have an echocardiogram. Echocardiography is a painless test that uses sound waves to create images of your heart. It provides your doctor with information about the size and shape of your heart and how well your hearts chambers and valves are working. This procedure takes approximately one hour. There are no restrictions for this procedure.    Follow-Up: At [facility], you and your health needs are our priority.  As part of our continuing mission to provide you with exceptional heart care, we have created designated Provider Care Teams.  These Care Teams include your primary Cardiologist (physician) and Advanced Practice Providers (APPs -  Physician Assistants and Nurse Practitioners) who all work together to provide you with the care you need, when you need it.  We recommend signing up for the patient portal called "MyChart".  Sign up information is provided on this After Visit Summary.  MyChart is used to connect with patients for Virtual Visits (Telemedicine).  Patients are able to view lab/test results, encounter notes, upcoming appointments, etc.  Non-urgent messages can be sent to your provider as well.   To learn more about what you can do with MyChart, go to [email or web address].    Your next appointment:   4 month(s)  The format for your next appointment:   In Person  Provider:   Shelva Majestic, MD

## 2019-09-26 NOTE — Progress Notes (Signed)
Foundation One and PDL-1 originally requested on 09/10/2019 with Missy at [facility]. On 09/18/2019, Foundation One was again requested as it was failed to be ordered with PDL-1. I have called to check on the status of Foundation One results, which has still not been ordered per Tomi Bamberger at [facility]. I have again requested Foundation One to be ordered. Awaiting callback from [facility] with an update on status of Foundation One.

## 2019-09-27 ENCOUNTER — Telehealth: Payer: Self-pay | Admitting: Oncology

## 2019-09-27 ENCOUNTER — Inpatient Hospital Stay: Payer: PPO | Admitting: Gynecologic Oncology

## 2019-09-27 ENCOUNTER — Other Ambulatory Visit: Payer: Self-pay | Admitting: Cardiovascular Disease

## 2019-09-27 NOTE — Telephone Encounter (Signed)
*  STAT* If patient is at the pharmacy, call can be transferred to refill team.   1. Which medications need to be refilled? (please list name of each medication and dose if known) Losartan 25 mg  2. Which pharmacy/location (including street and city if local pharmacy) is medication to be sent to? [id number]  3. Do they need a 30 day or 90 day supply? Not sure

## 2019-09-27 NOTE — Telephone Encounter (Signed)
Anselm Jungling and canceled appointment with Dr. Denman George since she is having a CT and seeing Dr. Delton Coombes next week.  Advised her to call if she has any questions after her upcoming appointments.  She verbalized understanding and agreement.

## 2019-09-28 ENCOUNTER — Encounter: Payer: Self-pay | Admitting: Cardiovascular Disease

## 2019-09-30 MED ORDER — LOSARTAN POTASSIUM 25 MG PO TABS: 25.0000 mg | ORAL_TABLET | Freq: Every day | ORAL | 2 refills | Status: DC

## 2019-10-01 ENCOUNTER — Ambulatory Visit (HOSPITAL_COMMUNITY): Payer: PPO | Admitting: Hematology

## 2019-10-02 ENCOUNTER — Encounter (HOSPITAL_COMMUNITY): Payer: Self-pay

## 2019-10-02 NOTE — Progress Notes (Signed)
Spoke with Rosann Auerbach at [facility]. Foundation One being sent off today. ICD10 code provided for order request.

## 2019-10-03 ENCOUNTER — Other Ambulatory Visit: Payer: Self-pay

## 2019-10-03 ENCOUNTER — Encounter (HOSPITAL_COMMUNITY): Payer: Self-pay

## 2019-10-03 ENCOUNTER — Ambulatory Visit (HOSPITAL_COMMUNITY)
Admission: RE | Admit: 2019-10-03 | Discharge: 2019-10-03 | Disposition: A | Discharge status: Home / Self Care | Payer: PPO | Source: Ambulatory Visit | Attending: Hematology | Admitting: Hematology

## 2019-10-03 DIAGNOSIS — R590 Localized enlarged lymph nodes: Secondary | ICD-10-CM | POA: Diagnosis not present

## 2019-10-03 DIAGNOSIS — K5730 Diverticulosis of large intestine without perforation or abscess without bleeding: Secondary | ICD-10-CM | POA: Diagnosis not present

## 2019-10-03 DIAGNOSIS — Z90710 Acquired absence of both cervix and uterus: Secondary | ICD-10-CM | POA: Diagnosis not present

## 2019-10-03 DIAGNOSIS — C561 Malignant neoplasm of right ovary: Secondary | ICD-10-CM | POA: Insufficient documentation

## 2019-10-03 DIAGNOSIS — I700 Atherosclerosis of aorta: Secondary | ICD-10-CM | POA: Diagnosis not present

## 2019-10-03 DIAGNOSIS — Z90722 Acquired absence of ovaries, bilateral: Secondary | ICD-10-CM | POA: Diagnosis not present

## 2019-10-03 DIAGNOSIS — K7689 Other specified diseases of liver: Secondary | ICD-10-CM | POA: Diagnosis not present

## 2019-10-03 MED ORDER — IOHEXOL 300 MG/ML  SOLN
100.0000 mL | Freq: Once | INTRAMUSCULAR | Status: AC | PRN
  Administered 2019-10-03: 100 mL via INTRAVENOUS

## 2019-10-07 ENCOUNTER — Ambulatory Visit (HOSPITAL_COMMUNITY): Payer: PPO | Admitting: Hematology

## 2019-10-08 ENCOUNTER — Telehealth: Payer: Self-pay | Admitting: Family Medicine

## 2019-10-08 NOTE — Telephone Encounter (Signed)
Serafina Royals (daughter) dropped off FMLA forms to be completed for her mother  Call Serafina Royals for pick up at: [phone]  Disposition: Dr's Folder

## 2019-10-09 NOTE — Telephone Encounter (Signed)
Paperwork completed, on pcp's desk to be signed.  

## 2019-10-09 NOTE — Telephone Encounter (Signed)
Paperwork received.

## 2019-10-11 ENCOUNTER — Other Ambulatory Visit: Payer: Self-pay

## 2019-10-11 ENCOUNTER — Ambulatory Visit (HOSPITAL_COMMUNITY): Payer: PPO | Attending: Cardiology

## 2019-10-11 DIAGNOSIS — I10 Essential (primary) hypertension: Secondary | ICD-10-CM | POA: Insufficient documentation

## 2019-10-11 DIAGNOSIS — Z79899 Other long term (current) drug therapy: Secondary | ICD-10-CM | POA: Diagnosis not present

## 2019-10-11 DIAGNOSIS — Z7901 Long term (current) use of anticoagulants: Secondary | ICD-10-CM | POA: Diagnosis not present

## 2019-10-11 DIAGNOSIS — I480 Paroxysmal atrial fibrillation: Secondary | ICD-10-CM | POA: Insufficient documentation

## 2019-10-11 LAB — ECHOCARDIOGRAM COMPLETE
Area-P 1/2: 2.75 cm2
P 1/2 time: 609 msec
S' Lateral: 2.25 cm

## 2019-10-13 DIAGNOSIS — R590 Localized enlarged lymph nodes: Secondary | ICD-10-CM | POA: Diagnosis not present

## 2019-10-14 NOTE — Telephone Encounter (Signed)
Patient's daughter aware that paperwork is completed & ready for pick up.

## 2019-10-15 ENCOUNTER — Encounter (HOSPITAL_COMMUNITY): Payer: Self-pay

## 2019-10-16 ENCOUNTER — Telehealth: Payer: Self-pay | Admitting: Family Medicine

## 2019-10-16 ENCOUNTER — Encounter: Payer: Self-pay | Admitting: Family Medicine

## 2019-10-16 MED ORDER — GABAPENTIN 300 MG PO CAPS: 300.0000 mg | ORAL_CAPSULE | Freq: Two times a day (BID) | ORAL | 3 refills | Status: DC

## 2019-10-16 NOTE — Telephone Encounter (Signed)
Pt would like the dr to start filling this medication gabapentin (NEURONTIN) 300 MG capsule; another Doctor has filled it in the past, which she does not see anymore.   [place], [place] - [id number] [place] [address]  Phone:  [phone]

## 2019-10-16 NOTE — Telephone Encounter (Signed)
Rx sent in - this medication is prescribed by pcp already.

## 2019-10-17 ENCOUNTER — Other Ambulatory Visit: Payer: Self-pay

## 2019-10-17 ENCOUNTER — Inpatient Hospital Stay (HOSPITAL_COMMUNITY): Payer: PPO | Attending: Hematology | Admitting: Hematology

## 2019-10-17 VITALS — BP 133/69 | HR 50 | Temp 96.9°F | Resp 17 | Wt 151.6 lb

## 2019-10-17 DIAGNOSIS — C786 Secondary malignant neoplasm of retroperitoneum and peritoneum: Secondary | ICD-10-CM | POA: Diagnosis not present

## 2019-10-17 DIAGNOSIS — C562 Malignant neoplasm of left ovary: Secondary | ICD-10-CM | POA: Insufficient documentation

## 2019-10-17 DIAGNOSIS — C561 Malignant neoplasm of right ovary: Secondary | ICD-10-CM

## 2019-10-17 DIAGNOSIS — F17210 Nicotine dependence, cigarettes, uncomplicated: Secondary | ICD-10-CM | POA: Insufficient documentation

## 2019-10-17 NOTE — Progress Notes (Signed)
Haley [name], [place] [address]   CLINIC:  Medical Oncology/Hematology  PCP:  Martinique, Hedi G, MD [address] / [place] [address] [phone]   REASON FOR VISIT:  Follow-up for right ovarian cancer  PRIOR THERAPY:  1. Neoadjuvant chemotherapy with carboplatin and paclitaxel x 3 cycles from 01/18/2018 to 03/02/2018. 2. BSO and omentectomy on 03/27/2018 followed by 3 more cycles of carboplatin and paclitaxel from 04/20/2018 to 05/31/2018. 3. Doxil and Avastin x 5 cycles from 07/16/2018 to 11/14/2018.  NGS Results: PD-L1 CPS 5%, Foundation 1 MS--stable, TMB 5 Muts/Mb  CURRENT THERAPY: Under work-up  BRIEF ONCOLOGIC HISTORY:  Oncology History Overview Note  Neg for genetics blood work and HRD High grade serous, recurrent biopsy proven PD-L1 5%  Relapsed within a few months of finishing carbo/taxol, minimal response to Doxil/Avastin, progressed on Gemzar   Right ovarian epithelial cancer ([place])  01/06/2018 Imaging   Ct abdomen and pelvis 1. Complex cystic mass at the right adnexa, measuring 5.9 x 4.0 cm, with nodular components, concerning for primary ovarian malignancy. 2. Diffuse nodularity along the omentum at the left side of the abdomen, extending into the mesentery at the left mid abdomen, concerning for peritoneal carcinomatosis. 3. Wall thickening at the distal ileum adjacent to the ovarian mass; bowel loops appear somewhat adherent to the ovarian mass. Bowel infiltration with tumor cannot be excluded. No evidence of bowel obstruction at this time. 4. Small volume ascites within the abdomen and pelvis.  Aortic Atherosclerosis (ICD10-I70.0).   01/08/2018 Tumor Marker   Patient's tumor was tested for the following markers: CA-125. Results of the tumor marker test revealed 3004   01/15/2018 Imaging   Chest CT:  1. No active cardiopulmonary disease. 2. Aortic atherosclerosis without aneurysm or dissection. 3. No large  central pulmonary embolus.  CT AP:  1. Dilated fluid-filled loops of small bowel are redemonstrated slightly more extensive than on prior exam with transition point likely in the right adnexa adjacent to a complex cystic mass concerning for ovarian neoplasm given septations and soft tissue nodularity. This raises concern for early or partial SBO. This soft tissue mass measures 4.9 x 4 x 4.7 cm and has not changed since prior recent comparison. Additional short segmental area of luminal narrowing is noted in the right lower quadrant involving small bowel for which stigmata of peritoneal carcinomatosis or small-bowel metastatic implants might account for this. 2. Redemonstration of small volume of ascites predominantly in the upper abdomen surrounding the liver and spleen. 3. Redemonstration of thick bandlike omental thickening concerning for peritoneal carcinomatosis.    01/15/2018 Procedure   Successful ultrasound-guided diagnostic and therapeutic paracentesis yielding 1.5 liters of peritoneal fluid.    01/15/2018 Pathology Results   PERITONEAL/ASCITIC FLUID (SPECIMEN 1 OF 1 COLLECTED 01/18/18): MALIGNANT CELLS CONSISTENT WITH METASTATIC ADENOCARCINOMA. SEE COMMENT. COMMENT: THE MALIGNANT CELLS ARE POSITIVE FOR MOC-31, CYTOKERATIN 7, ESTROGEN RECEPTOR, PAX-8, AND WT-1. THEY ARE NEGATIVE FOR CALRETININ, CYTOKERATIN 5/6, AND CYTOKERATIN 20. THE PROFILE IS CONSISTENT WITH A PRIMARY GYNECOLOGIC CARCINOMA. THERE IS LIKELY SUFFICIENT TUMOR PRESENT, IF ADDITIONAL STUDIES ARE REQUESTED.   01/15/2018 - 02/02/2018 Hospital Admission   She was admitted to the hospital for SBO. She was treated with chemotherapy   01/18/2018 - 06/20/2018 Chemotherapy   The patient had carboplatin and taxol   02/08/2018 Cancer Staging   Staging form: Ovary, Fallopian Tube, and Primary Peritoneal Carcinoma, AJCC 8th Edition - Clinical: cT3, cN0, cM0 - Signed by Heath Lark, MD on  02/08/2018   02/08/2018 Tumor Marker   Patient's  tumor was tested for the following markers: CA-125. Results of the tumor marker test revealed 445   03/02/2018 Tumor Marker   Patient's tumor was tested for the following markers: CA-125. Results of the tumor marker test revealed 174   03/15/2018 Imaging   6.2 cm complex cystic right ovarian mass, compatible with malignant ovarian neoplasm, mildly progressive.  Associated peritoneal disease/omental caking beneath the anterior abdominal wall, mildly improved. Prior abdominal ascites is improved/resolved.  4.7 cm cystic left ovarian mass, without overt malignant features, grossly unchanged.  Mild bilateral hydroureteronephrosis, secondary to extrinsic compression, new.  Prior small bowel obstruction has improved/resolved.    03/27/2018 Pathology Results   1. Omentum, resection for tumor - OMENTUM: - HIGH GRADE SEROUS CARCINOMA. 2. Ovary, left - LEFT OVARY: - HIGH GRADE SEROUS CARCINOMA. - LEFT FALLOPIAN TUBE: - HIGH GRADE SEROUS CARCINOMA. 3. Adnexa - ovary +/- tube, neoplastic, right - RIGHT OVARY: - HIGH GRADE SEROUS CARCINOMA, 5.5 CM. - RIGHT FALLOPIAN TUBE: - HIGH GRADE SEROUS CARCINOMA. 4. Peritoneum, biopsy, nodule - PERITONEUM: - NODULES OF HIGH GRADE SEROUS CARCINOMA. 5. Peritoneum, resection for tumor, rectosigmoid - RECTOSIGMOID PERITONEUM: - HIGH GRADE SEROUS CARCINOMA. Microscopic Comment 3. OVARY or FALLOPIAN TUBE or PRIMARY PERITONEUM: Procedure: Bilateral salpingo-oophorectomy, omentectomy and peritoneal biopsies. Specimen Integrity: N/A. Tumor Site: Right ovary. Ovarian Surface Involvement (required only if applicable): Yes. Fallopian Tube Surface Involvement (required only if applicable): Yes. Tumor Size: 5.5 x 4.8 x 4.0 cm. Histologic Type: Serous carcinoma. Histologic Grade: High grade. Implants (required for advanced stage serous/seromucinous borderline tumors only): Omentum, left ovary and fallopian tube, rectosigmoid peritoneum and  peritoneum. Other Tissue/ Organ Involvement: Left ovary and fallopian tube, omentum and rectosigmoid peritoneum. Largest Extrapelvic Peritoneal Focus (required only if applicable): 27.7 cm, omentum. Peritoneal/Ascitic Fluid: N/A. Treatment Effect (required only for high-grade serous carcinomas): Minimal. Regional Lymph Nodes: No lymph nodes submitted. Pathologic Stage Classification (pTNM, AJCC 8th Edition): pT3c, pNX. Representative Tumor Block: 3A, 3B, 3D, 3E and 29F. Comment(s): There is a 5.5 cm in greatest dimension partially cystic high grade serous carcinoma involving the right adnexal specimen and the tumor is staged as a primary right ovarian carcinoma. The carcinoma also involves the left ovary as well as the left fallopian tube, the omentum, peritoneum and the rectosigmoid peritoneal specimens.   03/27/2018 Surgery   Preoperative Diagnosis: ovarian cancer, stage IIIC, metastatic to omentum, peritoneum, serosa of intestines   Procedure(s) Performed: 1. Exploratory laparotomy with bilateral salpingo-oophorectomy, omentectomy radical tumor debulking for ovarian cancer .  Surgeon: Thereasa Solo, MD.   Specimens: Bilateral tubes / ovaries, omentum. Peritoneal nodules, rectosigmoid nodules.    Operative Findings: omental cake, miliary studding on diaphragm and bilateral paracolic gutters. Sigmoid colon densely adherent to left and right ovaries with tumor rind, ureters mildly dilated bilaterally due to retroperitoneal extension of the tumor towards ureters causing compression.    This represented an optimal cytoreduction (R1) with gross residual disease on the diaphragm that had been ablated and a thin tumor plaque on the sigmoid colon that was ablated. No residual disease >1cm remaining   05/07/2018 Genetic Testing   Negative genetic testing on the [facility] TumorNextHRD+ CancerNext Panel. The CancerNext gene panel offered by [employer] includes sequencing and rearrangement analysis for  the following 34 genes:   APC, ATM, BARD1, BMPR1A, BRCA1, BRCA2, BRIP1, CDH1, CDK4, CDKN2A, CHEK2, DICER1, EPCAM, GREM1, HOXB13, MLH1, MRE11A, MSH2, MSH6, MUTYH, NBN, NF1, PALB2, PMS2, POLD1, POLE, PTEN, RAD50,  RAD51C, RAD51D, SMAD4, SMARCA4, STK11, and TP53. Somatic genes analyzed through TumorNext-HRD: ATM, BARD1, BRCA1, BRCA2, BRIP1, CHEK2, MRE11A, NBN, PALB2, RAD51C, RAD51D. The report date is 05/07/2018.   05/11/2018 Tumor Marker   Patient's tumor was tested for the following markers: CA-125. Results of the tumor marker test revealed 60.5   07/02/2018 Imaging   1. Mild right pericardiophrenic adenopathy is mildly increased, suspicious for metastatic nodes. No abdominopelvic adenopathy. 2. Small left peritoneal soft tissue nodule adjacent to the splenic flexure and smooth left pelvic sidewall peritoneal thickening, cannot exclude residual/recurrent disease. Attention on follow-up CT advised. 3. Bilateral renal collecting system dilatation has improved. 4.  Aortic Atherosclerosis (ICD10-I70.0).   07/02/2018 Tumor Marker   Patient's tumor was tested for the following markers: CA-125 Results of the tumor marker test revealed 42.7   07/11/2018 Echocardiogram   1. The left ventricle has normal systolic function with an ejection fraction of 60-65%. The cavity size was normal. Left ventricular diastolic parameters were normal.  2. Normal GLS -20.1.  3. The right ventricle has normal systolic function. The cavity was normal. There is no increase in right ventricular wall thickness.  4. The mitral valve is degenerative. Moderate thickening of the mitral valve leaflet. Moderate calcification of the mitral valve leaflet.  5. The aortic valve is tricuspid. Moderate thickening of the aortic valve. Moderate calcification of the aortic valve. Aortic valve regurgitation is trivial by color flow Doppler.  6. The aortic root is normal in size and structure.   07/16/2018 - 12/03/2018 Chemotherapy   The patient had  doxorubicin and bevacizumab for chemotherapy treatment.     07/30/2018 Tumor Marker   Patient's tumor was tested for the following markers: CA-125 Results of the tumor marker test revealed 82.6   08/27/2018 Tumor Marker   Patient's tumor was tested for the following markers: CA-125 Results of the tumor marker test revealed 111.   09/24/2018 Tumor Marker   Patient's tumor was tested for the following markers: CA-125 Results of the tumor marker test revealed 142.   10/05/2018 Imaging   1. Stable right juxta diaphragmatic/pericardiophrenic lymph node, mildly enlarged. 2. Otherwise no definite metastatic disease identified in the abdomen/pelvis. 3. 3 mm peritoneal nodule noted left paracolic gutter on today's study, indeterminate. Attention on follow-up recommended. 4.  Aortic Atherosclerois (ICD10-170.0)   10/16/2018 Echocardiogram    1. No significant change from prior study (07/11/2018).  2. Left ventricular ejection fraction, by visual estimation, is 60 to 65%. The left ventricle has normal function. Normal left ventricular size. There is no left ventricular hypertrophy.  3. LVEF by 3D assessment 63%.  4. The average left ventricular global longitudinal strain is -22.4 %.  5. Global right ventricle has normal systolic function.The right ventricular size is normal. No increase in right ventricular wall thickness.  6. Left atrial size was normal.  7. Right atrial size was normal.  8. Presence of pericardial fat pad.  9. Moderate thickening of the mitral valve leaflet(s). 10. The mitral valve is normal in structure. Trace mitral valve regurgitation. 11. The tricuspid valve is normal in structure. Tricuspid valve regurgitation is mild. 12. Aortic regurgitation PHT measures 467 msec. 13. The aortic valve is tricuspid Aortic valve regurgitation is mild by color flow Doppler. Mild aortic valve sclerosis without stenosis. 14. There is mild to moderate calcification of the AoV, with focal  calcification of the [place]. The aortic regurgitation is mild in severity, likely related to degenerative valve disease. 15. The pulmonic valve was grossly normal. Pulmonic  valve regurgitation is not visualized by color flow Doppler. 16. Mild plaque invoving the ascending aorta. 17. Normal pulmonary artery systolic pressure. 18. The tricuspid regurgitant velocity is 2.52 m/s, and with an assumed right atrial pressure of 3 mmHg, the estimated right ventricular systolic pressure is normal at 28.4 mmHg.   10/26/2018 Tumor Marker   Patient's tumor was tested for the following markers: CA-125 Results of the tumor marker test revealed 196   12/11/2018 Imaging   1. No substantial interval change in exam. No definite findings to suggest recurrent/metastatic disease. 2. Right pericardial phrenic lymph node noted on multiple prior studies is unchanged in the interval. 3. 8 mm omental nodule identified on today's exam. Close attention on follow-up recommended. 3 mm soft tissue nodule along the left paracolic gutter seen previously has resolved in the interval. 4.  Aortic Atherosclerois (ICD10-170.0)   04/02/2019 Imaging   1. Interval development of left axillary and subpectoral lymphadenopathy is highly concerning for metastatic disease, however, this would be a highly unusual pattern of metastatic spread for an ovarian primary neoplasm. This is most concerning for potential left-sided breast cancer. Correlation with mammography is strongly recommended. 2. No other signs of definite metastatic disease noted elsewhere in the chest, abdomen or pelvis. 3. Aortic atherosclerosis. 4. Additional incidental findings, similar to prior studies, as above.     04/03/2019 Pathology Results   Immunohistochemistry shows the carcinoma is positive with cytokeratin AE1/AE3, cytokeratin 7, PAX-8, WT-1, and shows patchy positivity with estrogen receptor. The carcinoma is negative with GATA3, GCDFP, p53, Napsin A and TTF-1. The  immunophenotype is most consistent with metastatic high grade serous carcinoma. (JDP:ah 04/04/19)FINAL DIAGNOSIS Diagnosis Lymph node, needle/core biopsy, left axilla - METASTATIC CARCINOMA. - SEE MICROSCOPIC DESCRIPT    Genetic Testing   Patient has genetic testing done for PD-L1. Results revealed patient has the following: PD-L1 staining in tumor cells (TC): 3% PD-L1 staining in tumor-associated immune cells (IC): 10% PD-L1 combined positive score (CPS): 5%   04/15/2019 - 06/13/2019 Chemotherapy   The patient had gemzar for chemotherapy treatment.     06/21/2019 Imaging   1. Bulky LEFT axillary adenopathy greater than RIGHT axillary adenopathy increasing in size. 2. Developing nodularity and or small lymph nodes along the LEFT hemidiaphragm in the inferior pre pericardial fat. Attention on follow-up. 3. Stable small lymph nodes throughout the retroperitoneum. 4. Stable low-density hepatic lesions compatible with cysts 5. Aortic atherosclerosis.   Aortic Atherosclerosis (ICD10-I70.0).     10/03/2019 Genetic Testing   [facility] Results:       CANCER STAGING: Cancer Staging Right ovarian epithelial cancer ([place]) Staging form: Ovary, Fallopian Tube, and Primary Peritoneal Carcinoma, AJCC 8th Edition - Clinical: cT3, cN0, cM0 - Signed by Heath Lark, MD on 02/08/2018   INTERVAL HISTORY:  [name], a 74 y.o. female, returns for routine follow-up of her right ovarian cancer. Lacrisha was last seen on 09/09/2019.  Today she is accompanied by her daughter. She complains of having continuing discomfort in her left axilla where the biopsy was done. Her appetite is good and she denies having N/V/D. She denies having any autoimmune issues.   REVIEW OF SYSTEMS:  Review of Systems  Constitutional: Negative for appetite change and fatigue.  Gastrointestinal: Negative for diarrhea, nausea and vomiting.  Genitourinary: Positive for frequency.   Musculoskeletal: Positive for  arthralgias (L axilla).  Psychiatric/Behavioral: Positive for depression.    PAST MEDICAL/SURGICAL HISTORY:  Past Medical History:  Diagnosis Date  . Allergy  SEASONAL  . Anxiety   . Cataract    BILATERAL  . Dysrhythmia   . Family history of lung cancer   . Family history of prostate cancer   . Family history of prostate cancer   . Family history of uterine cancer   . Fibromyalgia   . GERD (gastroesophageal reflux disease)   . H/O echocardiogram 04/28/08   EF>55% trace mitral regurgitation, No significant valvular pathology  . Hemorrhoids    internal  . History of stress test 02/08/2011   Normal Myocardial perfusion study, this is a low risk scan, No prior study available for comparison  . Hyperlipidemia   . Hypertension    hx of  . Hypothyroidism   . MVP (mitral valve prolapse)    mild and MR  . OA (osteoarthritis)   . Osteoporosis   . ovarian ca dx'd 12/2017   ovarian cancer  . Paroxysmal A-fib ([place])   . Sleep apnea    does not use prescribed CPAP  does not tolerate  . Thyroid disease    HYPO  . Varicosities of leg   . Vitamin D deficiency    Past Surgical History:  Procedure Laterality Date  . ABDOMINAL HYSTERECTOMY  1989  . APPENDECTOMY  1989   with hysterectomy  . BILATERAL SALPINGECTOMY N/A 03/27/2018   Procedure: EXPLORATORY LAPARATOMY, BILATERAL SALPINGOOPHERECTOMY;  Surgeon: Everitt Amber, MD;  Location: [facility];  Service: Gynecology;  Laterality: N/A;  . BREAST EXCISIONAL BIOPSY Left 1995   Benign  . COLONOSCOPY  11-20-2000  . DEBULKING N/A 03/27/2018   Procedure: RADICAL TUMOR DEBULKING;  Surgeon: Everitt Amber, MD;  Location: [facility];  Service: Gynecology;  Laterality: N/A;  . IR IMAGING GUIDED PORT INSERTION  01/17/2018  . NOSE SURGERY  1990  . OMENTECTOMY N/A 03/27/2018   Procedure: OMENTECTOMY;  Surgeon: Everitt Amber, MD;  Location: [facility];  Service: Gynecology;  Laterality: N/A;    SOCIAL HISTORY:  Social History   Socioeconomic History  .  Marital status: Married    Spouse name: Not on file  . Number of children: 3  . Years of education: Not on file  . Highest education level: Not on file  Occupational History  . Occupation: retired  Tobacco Use  . Smoking status: Light Tobacco Smoker    Years: 10.00    Types: Cigars  . Smokeless tobacco: Never Used  . Tobacco comment: not daily  [place] cigars 1-2   Vaping Use  . Vaping Use: Never used  Substance and Sexual Activity  . Alcohol use: No    Alcohol/week: 0.0 standard drinks  . Drug use: No  . Sexual activity: Not Currently    Comment: 1st intercourse 17yo-1 partner  Other Topics Concern  . Not on file  Social History Narrative  . Not on file   Social Determinants of Health   Financial Resource Strain: Low Risk   . Difficulty of Paying Living Expenses: Not hard at all  Food Insecurity: No Food Insecurity  . Worried About Charity fundraiser in the Last Year: Never true  . Ran Out of Food in the Last Year: Never true  Transportation Needs: No Transportation Needs  . Lack of Transportation (Medical): No  . Lack of Transportation (Non-Medical): No  Physical Activity: Inactive  . Days of Exercise per Week: 0 days  . Minutes of Exercise per Session: 0 min  Stress: No Stress Concern Present  . Feeling of Stress : Not at all  Social Connections: Moderately  Integrated  . Frequency of Communication with Friends and Family: More than three times a week  . Frequency of Social Gatherings with Friends and Family: Three times a week  . Attends Religious Services: More than 4 times per year  . Active Member of Clubs or Organizations: No  . Attends Archivist Meetings: Never  . Marital Status: Married  Human resources officer Violence: Not At Risk  . Fear of Current or Ex-Partner: No  . Emotionally Abused: No  . Physically Abused: No  . Sexually Abused: No    FAMILY HISTORY:  Family History  Problem Relation Age of Onset  . Diabetes Daughter   . Psoriasis  Daughter   . Heart defect Daughter   . Diabetes Mother   . Hypertension Mother   . Heart disease Mother   . Stroke Maternal Grandmother   . Heart attack Brother   . Heart attack Brother   . Prostatitis Brother   . Lung cancer Niece   . Cancer Paternal Aunt        type of cancer unk  . Prostate cancer Paternal Uncle   . Uterine cancer Cousin        dx under 2  . Colon cancer Neg Hx     CURRENT MEDICATIONS:  Current Outpatient Medications  Medication Sig Dispense Refill  . ALPRAZolam (XANAX) 0.5 MG tablet TAKE 1/2 TO 1 (ONE-HALF TO ONE) TABLET BY MOUTH ONCE DAILY AS NEEDED FOR ANXIETY 30 tablet 2  . alum & mag hydroxide-simeth (MAALOX/MYLANTA) 200-200-20 MG/5ML suspension Take 30 mLs by mouth every 6 (six) hours as needed for indigestion or heartburn. 355 mL 0  . Artificial Saliva (SALIVASURE) LOZG Use as directed 1 lozenge in the mouth or throat every 4 (four) hours as needed. 90 lozenge 0  . Calcium Carbonate (CALTRATE 600) 1500 MG TABS Take 600 mg of elemental calcium by mouth daily.     . cholecalciferol (VITAMIN D) 1000 UNITS tablet Take 1,000 Units by mouth daily.     Marland Kitchen estradiol (ESTRACE) 0.1 MG/GM vaginal cream Place 1 Applicatorful vaginally 3 (three) times a week. 42.5 g 12  . fluticasone (CUTIVATE) 0.05 % cream APPLY CREAM TOPICALLY UP TO TWICE DAILY AS NEEDED FOR RASH    . fluticasone (FLONASE) 50 MCG/ACT nasal spray Place 2 sprays into both nostrils daily. 48 g 2  . gabapentin (NEURONTIN) 300 MG capsule Take 1 capsule (300 mg total) by mouth 2 (two) times daily. 60 capsule 3  . HYDROmorphone (DILAUDID) 2 MG tablet Take 1 tablet (2 mg total) by mouth daily as needed for severe pain. 30 tablet 0  . lactose free nutrition (BOOST PLUS) LIQD Take 237 mLs by mouth daily. (Patient taking differently: Take 237 mLs by mouth 2 (two) times daily between meals. ) 10 Can 0  . levothyroxine (SYNTHROID) 75 MCG tablet Take 1 tablet by mouth every day 90 tablet 1  . losartan (COZAAR) 25  MG tablet Take 1 tablet (25 mg total) by mouth daily. 30 tablet 2  . Magnesium 400 MG TABS Take 250 mg by mouth daily.    . Menthol-Methyl Salicylate (MUSCLE RUB) 10-15 % CREA Apply 1 application topically as needed for muscle pain.    . metoprolol succinate (TOPROL-XL) 50 MG 24 hr tablet Take 1 tablet (50 mg total) by mouth daily. Take with or immediately following a meal. 90 tablet 1  . Multiple Vitamins-Minerals (MULTIVITAMIN WITH MINERALS) tablet Take 1 tablet by mouth daily.      Marland Kitchen  Omega-3 Fatty Acids (FISH OIL) 1000 MG CAPS Take 2,000 mg by mouth 2 (two) times daily.     Marland Kitchen omeprazole (PRILOSEC) 40 MG capsule Take 1 capsule (40 mg total) by mouth daily. 90 capsule 3  . polyethylene glycol (MIRALAX / GLYCOLAX) packet Take 17 g by mouth daily. 14 each 0  . senna (SENOKOT) 8.6 MG TABS tablet Take 1 tablet (8.6 mg total) by mouth at bedtime as needed for mild constipation. 120 each 0  . simvastatin (ZOCOR) 20 MG tablet Take 1 tablet (20 mg total) by mouth at bedtime. 90 tablet 1  . tretinoin (RETIN-A) 0.025 % cream APPLY TOPICALLY TO FACE AT BEDTIME AS NEEDED    . vitamin B-12 (CYANOCOBALAMIN) 1000 MCG tablet Take 1,000 mcg by mouth daily.    . vitamin C (ASCORBIC ACID) 500 MG tablet Take 1,000 mg by mouth daily.    . vitamin E 45 MG (100 UNITS) capsule Take by mouth daily.    [name] 20 MG TABS tablet TAKE 1 TABLET BY MOUTH ONCE DAILY WITH SUPPER 90 tablet 2  . lidocaine-prilocaine (EMLA) cream Apply to affected area once (Patient not taking: Reported on 10/17/2019) 30 g 3   No current facility-administered medications for this visit.    ALLERGIES:  Allergies  Allergen Reactions  . Tramadol Hcl     jittery    PHYSICAL EXAM:  Performance status (ECOG): 1 - Symptomatic but completely ambulatory  Vitals:   10/17/19 1200  BP: 133/69  Pulse: (!) 50  Resp: 17  Temp: (!) 96.9 F (36.1 C)  SpO2: 96%   Wt Readings from Last 3 Encounters:  10/17/19 151 lb 9.6 oz (68.8 kg)  09/26/19  146 lb (66.2 kg)  09/11/19 145 lb (65.8 kg)   Physical Exam Vitals reviewed.  Constitutional:      Appearance: Normal appearance.  Cardiovascular:     Rate and Rhythm: Normal rate and regular rhythm.     Pulses: Normal pulses.     Heart sounds: Normal heart sounds.  Pulmonary:     Effort: Pulmonary effort is normal.     Breath sounds: Normal breath sounds.  Chest:     Comments: Port-a-Cath in R chest Abdominal:     Palpations: Abdomen is soft. There is no hepatomegaly, splenomegaly or mass.     Tenderness: There is abdominal tenderness (mild) in the periumbilical area.     Hernia: No hernia is present.  Lymphadenopathy:     Upper Body:     Right upper body: Axillary adenopathy (R > L) present.     Left upper body: Axillary adenopathy present.     Lower Body: No right inguinal adenopathy. No left inguinal adenopathy.  Neurological:     General: No focal deficit present.     Mental Status: She is alert and oriented to person, place, and time.  Psychiatric:        Mood and Affect: Mood normal.        Behavior: Behavior normal.      LABORATORY DATA:  I have reviewed the labs as listed.  CBC Latest Ref Rng & Units 09/09/2019 06/13/2019 06/03/2019  WBC 4.0 - 10.5 K/uL 7.4 6.5 6.1  Hemoglobin 12.0 - 15.0 g/dL 12.8 10.5(L) 10.9(L)  Hematocrit 36 - 46 % 41.5 32.1(L) 35.0(L)  Platelets 150 - 400 K/uL 175 89(L) 227   CMP Latest Ref Rng & Units 09/09/2019 06/13/2019 06/03/2019  Glucose 70 - 99 mg/dL 98 96 113(H)  BUN 8 - 23  mg/dL _0 Creatinine 0.44 - 1.00 mg/dL 0.71 0.81 0.81  Sodium 135 - 145 mmol/L 137 141 141  Potassium 3.5 - 5.1 mmol/L 4.3 4.0 3.8  Chloride 98 - 111 mmol/L 105 108 107  CO2 22 - 32 mmol/L _1 Calcium 8.9 - 10.3 mg/dL 8.6(L) 9.2 8.9  Total Protein 6.5 - 8.1 g/dL 7.5 6.3(L) 6.8  Total Bilirubin 0.3 - 1.2 mg/dL 0.4 0.3 0.4  Alkaline Phos 38 - 126 U/L 71 80 65  AST 15 - 41 U/L 14(L) 15 13(L)  ALT 0 - 44 U/L _2 Lab Results  Component Value Date    CA125 768 (H) 09/09/2019   CA125 196 (H) 10/22/2018   CA125 3,004 (H) 01/08/2018    DIAGNOSTIC IMAGING:  I have independently reviewed the scans and discussed with the patient. CT CHEST ABDOMEN PELVIS W CONTRAST  Result Date: 10/03/2019 CLINICAL DATA:  History of serous ovarian carcinoma. Diagnosed in 2020. Platinum resistant tumor EXAM: CT CHEST, ABDOMEN, AND PELVIS WITH CONTRAST TECHNIQUE: Multidetector CT imaging of the chest, abdomen and pelvis was performed following the standard protocol during bolus administration of intravenous contrast. CONTRAST:  160m OMNIPAQUE IOHEXOL 300 MG/ML  SOLN COMPARISON:  June 21, 2018 FINDINGS: CT CHEST FINDINGS Cardiovascular: Calcified and noncalcified atheromatous plaque in the thoracic aorta. No aneurysmal dilation. Heart size is normal. Central pulmonary vasculature on venous phase assessment is unremarkable. RIGHT-sided Port-A-Cath remains in place. Tip at the caval to atrial junction. Mediastinum/Nodes: Bulky bilateral axillary lymphadenopathy. Dominant lymph node in the LEFT axilla (image 15, series 2) 1.8 cm short axis, previously 1.8 cm short axis. (Image 17, series 2) lymph node measuring 1.5 cm short axis previously 1.6 cm. Other lymph nodes with similar appearance. Lymph nodes track into the subpectoral chains and LEFT supraclavicular region a small rounded lymph node in the LEFT supraclavicular region slightly enlarged just under a cm at 9 mm. This area previously measured 6 mm. RIGHT axillary lymph nodes have enlarged particularly a RIGHT subpectoral lymph node on image 17 of series 2 at 1.4 cm short axis, previously less than a cm. Lymph nodes of previously display fatty hila and normal size in this area have enlarged, for instance a subcentimeter short axis lymph node on the prior study now on image 16 of series 2 measuring 1.5 cm. The largest lymph node in the RIGHT axilla on the prior study and on the current study (image 22, series 2) 2 cm,  previously 1.6 cm. Similar enlargement of other small lymph nodes in the RIGHT axilla. No thoracic inlet adenopathy about the lower neck though with scattered small nodes in this area. Mediastinal adenopathy with increase since the study of June of 2021 (image 17, series 2) 1.1 cm lymph node previously less than a cm at 5-6 mm. Other small lymph nodes mildly increased in size in the mediastinum as well. No hilar lymphadenopathy. Pre pericardial lymph node on image 44 of series 2 1.2 cm previously 1.1 cm. Other small pre pericardial lymph nodes are slightly enlarged. These are increased in conspicuity less than a cm when compared to the prior study. Lungs/Pleura: Lungs are clear. No effusion. No consolidation. Airways are patent. Musculoskeletal: No acute musculoskeletal process involving the bony thorax, see below for full musculoskeletal details. CT ABDOMEN PELVIS FINDINGS Hepatobiliary: Low attenuation along the posterior aspect of the LEFT hemi liver with similar appearance. No new abnormality in the liver. Perihepatic nodularity tracking into more since pouch  with similar appearance. Portal vein is patent. No pericholecystic stranding. No biliary duct dilation. Pancreas: Pancreas without ductal dilation or sign of inflammation. Spleen: Spleen normal in size and contour. Adrenals/Urinary Tract: Adrenal glands are normal. Symmetric renal enhancement. No hydronephrosis. Urinary bladder normal. Stomach/Bowel: Stomach and small bowel appearance. Soft tissue nodularity along the medial wall of the colon may have increased near the ileocecal valve an area measuring approximately 1.5 cm in greatest thickness on image 87 of series 2, very subtle area in this location on the prior study measuring approximately 0.7 cm. Colonic diverticulosis. Vascular/Lymphatic: Pre aortic lymph node at the level of the renal vein, just below the renal vein on the LEFT 1.5 cm (image 68, series 2) less than a cm on the prior study. Celiac  lymph node adjacent to celiac axis (image 63, series 2) previously less than a cm now at 1.3 cm. Numerous bulky and rounded lymph nodes in the hepatic gastric ligament. For example a lymph node on image 61 of series 2 measuring 14 mm, previously 9-10 mm. Reproductive: Mildly enlarged lymph node at the iliac bifurcation (image 87, series 2) 0.8 cm. Post hysterectomy and bilateral salpingo-oophorectomy. No dominant pelvic mass. No ascites. Other: Thickening of the RIGHT rectus muscle with similar appearance compared to prior imaging. This is long segment and may reflect postoperative changes rather than pathologic involvement. Musculoskeletal: No acute musculoskeletal process. Spinal degenerative changes. IMPRESSION: 1. Bulky axillary adenopathy enlarged particularly in the RIGHT axilla with relatively stable appearance of LEFT axillary lymph nodes. 2. Increasing size albeit slight of mediastinal lymph nodes since the previous study. 3. Soft tissue implant along the ascending colon best seen in the coronal plane and displayed on image 55 of series 4. In the axial plane this measures approximately 1.5 x 1.5 cm and extends along approximately 1.8 cm of the RIGHT hemicolon. 4. Enlarging lymph nodes and increase in number of lymph nodes in the upper abdomen. 5. Other stigmata of disease particularly in more since pouch with similar appearance along the inferior surface of the RIGHT hemi liver. 6. Similar appearance of perihepatic nodularity tracking into more since pouch. 7. Aortic atherosclerosis. Aortic Atherosclerosis (ICD10-I70.0). Electronically Signed   By: Zetta Bills M.D.   On: 10/03/2019 16:46   ECHOCARDIOGRAM COMPLETE  Result Date: 10/11/2019    ECHOCARDIOGRAM REPORT   Patient Name:   Haley [name] Date of Exam: 10/11/2019 Medical Rec #:  [id number]         Height:       62.0 in Accession #:    [id number]        Weight:       146.0 lb Date of Birth:  Dec 01, 1945          BSA:          1.672 m Patient Age:     46 years          BP:           152/72 mmHg Patient Gender: F                 HR:           53 bpm. Exam Location:  [facility] Procedure: 2D Echo, Strain Analysis, Cardiac Doppler and Color Doppler Indications:    I48.0 Atrial Fibrillation  History:        Patient has prior history of Echocardiogram examinations, most                 recent  10/16/2018. Mitral Valve Prolapse, Arrythmias:Atrial                 Fibrillation, Signs/Symptoms:Chest Pain; Risk                 Factors:Hypertension, Dyslipidemia, Sleep Apnea, Family History                 of Coronary Artery Disease and Current Smoker. Metastatic                 Ovarian Cancer status post Chemotherapy, Palpitations.  Sonographer:    Deliah Boston RDCS Referring Phys: [place]  1. Left ventricular ejection fraction, by estimation, is 60 to 65%. The left ventricle has normal function. The left ventricle has no regional wall motion abnormalities. Left ventricular diastolic parameters are consistent with Grade II diastolic dysfunction (pseudonormalization).  2. Right ventricular systolic function is normal. The right ventricular size is normal. There is normal pulmonary artery systolic pressure.  3. Left atrial size was moderately dilated.  4. Right atrial size was moderately dilated.  5. The mitral valve is abnormal. Trivial mitral valve regurgitation. No evidence of mitral stenosis. There is mild late systolic prolapse of both leaflets of the mitral valve.  6. Tricuspid valve regurgitation is mild to moderate.  7. The aortic valve is tricuspid. There is mild calcification of the aortic valve. Aortic valve regurgitation is trivial. Mild aortic valve sclerosis is present, with no evidence of aortic valve stenosis.  8. Aortic dilatation noted. There is mild dilatation of the ascending aorta, measuring 38 mm.  9. The inferior vena cava is normal in size with greater than 50% respiratory variability, suggesting right atrial pressure  of 3 mmHg. Comparison(s): Changes from prior study are noted. Atria slightly more dilated, TR has progressed slightly, MVP better seen. FINDINGS  Left Ventricle: Left ventricular ejection fraction, by estimation, is 60 to 65%. The left ventricle has normal function. The left ventricle has no regional wall motion abnormalities. The left ventricular internal cavity size was normal in size. There is  borderline left ventricular hypertrophy. Left ventricular diastolic parameters are consistent with Grade II diastolic dysfunction (pseudonormalization). Right Ventricle: The right ventricular size is normal. No increase in right ventricular wall thickness. Right ventricular systolic function is normal. There is normal pulmonary artery systolic pressure. The tricuspid regurgitant velocity is 2.87 m/s, and  with an assumed right atrial pressure of 3 mmHg, the estimated right ventricular systolic pressure is 16.5 mmHg. Left Atrium: Left atrial size was moderately dilated. Right Atrium: Right atrial size was moderately dilated. Pericardium: There is no evidence of pericardial effusion. Mitral Valve: The mitral valve is abnormal. There is mild late systolic prolapse of both leaflets of the mitral valve. There is mild thickening of the mitral valve leaflet(s). Trivial mitral valve regurgitation. No evidence of mitral valve stenosis. Tricuspid Valve: The tricuspid valve is grossly normal. Tricuspid valve regurgitation is mild to moderate. Aortic Valve: Nodular calcification of NCC predominantly. The aortic valve is tricuspid. There is mild calcification of the aortic valve. Aortic valve regurgitation is trivial. Aortic regurgitation PHT measures 609 msec. Mild aortic valve sclerosis is present, with no evidence of aortic valve stenosis. Pulmonic Valve: The pulmonic valve was not well visualized. Pulmonic valve regurgitation is mild. Aorta: Aortic dilatation noted. There is mild dilatation of the ascending aorta, measuring 38 mm.  Venous: The inferior vena cava is normal in size with greater than 50% respiratory variability, suggesting right atrial pressure of 3 mmHg.  IAS/Shunts: The atrial septum is grossly normal.  LEFT VENTRICLE PLAX 2D LVIDd:         4.10 cm  Diastology LVIDs:         2.25 cm  LV e' medial:    7.62 cm/s LV PW:         1.25 cm  LV E/e' medial:  14.1 LV IVS:        0.75 cm  LV e' lateral:   8.38 cm/s LVOT diam:     2.00 cm  LV E/e' lateral: 12.8 LV SV:         73 LV SV Index:   43       2D Longitudinal Strain LVOT Area:     3.14 cm 2D Strain GLS (A2C):   21.2 %                         2D Strain GLS (A3C):   21.5 %                         2D Strain GLS (A4C):   19.7 %                         2D Strain GLS Avg:     20.8 % RIGHT VENTRICLE RV S prime:     12.60 cm/s TAPSE (M-mode): 2.9 cm LEFT ATRIUM             Index       RIGHT ATRIUM           Index LA diam:        3.30 cm 1.97 cm/m  RA Area:     20.90 cm LA Vol (A2C):   56.6 ml 33.84 ml/m RA Volume:   59.60 ml  35.64 ml/m LA Vol (A4C):   69.2 ml 41.38 ml/m LA Biplane Vol: 67.0 ml 40.06 ml/m  AORTIC VALVE LVOT Vmax:   90.00 cm/s LVOT Vmean:  58.000 cm/s LVOT VTI:    0.232 m AI PHT:      609 msec  AORTA Ao Root diam: 3.20 cm Ao Asc diam:  3.80 cm MITRAL VALVE                TRICUSPID VALVE MV Area (PHT): cm          TR Peak grad:   32.9 mmHg MV Decel Time: 276 msec     TR Vmax:        287.00 cm/s MV E velocity: 107.50 cm/s MV A velocity: 96.40 cm/s   SHUNTS MV E/A ratio:  1.12         Systemic VTI:  0.23 m                             Systemic Diam: 2.00 cm Buford Dresser MD Electronically signed by Buford Dresser MD Signature Date/Time: 10/11/2019/3:44:37 PM    Final      ASSESSMENT:  1.  Platinum resistant high-grade serous ovarian carcinoma: -Neoadjuvant chemotherapy with 3 cycles of carboplatin and paclitaxel from 01/18/2018 through 03/02/2018 presentation as stage IIIc -03/27/2018-BSO,omentectomy with pathology showing high-grade serous carcinoma,  PT3CP NX followed by 3 more cycles of carboplatin and paclitaxel from 04/20/2018 through 05/31/2018. -5 cycles of Doxil and Avastin from 07/16/2018 through 11/14/2018 -Left axillary lymph node biopsy on 04/03/2019 consistent with high-grade serous carcinoma. -Third  line therapy with gemcitabine 3 cycles from 04/15/2019 through 06/03/2019. -CT scan of the AP on 06/21/2019 shows bulky left axillary adenopathy greater than the right axillary adenopathy increasing in size, developing nodularity and/or small lymph nodes along the left hemidiaphragm in the inferior prepericardial fat.  Stable small lymph nodes throughout the retroperitoneum. -She was seen by Drs. Alvy Bimler and Dr. Denman George and was given palliative options including chemotherapy with topotecan, docetaxel and pembrolizumab. -PD-L1 CPS score is 6%. -Germline and somatic mutation testing negative on 04/11/2018. -Foundation 1 testing did not show targetable mutations. -CT CAP from 10/03/2019 shows bulky axillary adenopathy enlarged on the right side and stable on the left axilla.  Increasing size of the mediastinal lymph nodes.  Soft tissue implant along the ascending colon.  Enlarging lymph nodes and increasing number of lymph nodes in the upper abdomen. -CA-125 is worsening at 768.  2.  Social/family history: -Patient smokes half pack per day of cigarettes. -Family history significant for maternal niece with breast cancer.   PLAN:  1.  Platinum resistant high-grade serous ovarian carcinoma: -We reviewed results of the CT scan which is showing progression. -CA-125 is elevated at 768. -She has very good performance status.  We discussed options including chemotherapy with topotecan with the response rates of around 20% and stable disease 40%. -We also discussed role of pembrolizumab based on keynote-100 study.  I have quoted response rates anywhere between 5 and 10% with this regimen.  However one can achieve durable responses.  She was interested in  immunotherapy at this time. -We discussed immunotherapy related side effects in detail. -She already has port.  We will likely start her in the next 7 to 10 days.   Orders placed this encounter:  No orders of the defined types were placed in this encounter.    Derek Jack, MD [place] [phone]   I, Milinda Antis, am acting as a scribe for Dr. Sanda Linger.  I, Derek Jack MD, have reviewed the above documentation for accuracy and completeness, and I agree with the above.

## 2019-10-17 NOTE — Progress Notes (Signed)
DISCONTINUE OFF PATHWAY REGIMEN - Ovarian   OFF00015:Gemcitabine 1,000 mg/m2 IV D1,8,15 q28 Days:   A cycle is every 28 days:     Gemcitabine   **Always confirm dose/schedule in your pharmacy ordering system**  REASON: Disease Progression PRIOR TREATMENT: Off Pathway: Gemcitabine 1,000 mg/m2 IV D1,8,15 q28 Days TREATMENT RESPONSE: Progressive Disease (PD)  START OFF PATHWAY REGIMEN - Ovarian   [id number]:Pembrolizumab 200 mg IV D1 q21 Days:   A cycle is every 21 days:     Pembrolizumab   **Always confirm dose/schedule in your pharmacy ordering system**  Patient Characteristics: Recurrent or Progressive Disease, Third Line, Platinum Resistant or < 6 Months Since Last Platinum Therapy BRCA Mutation Status: Absent Therapeutic Status: Recurrent or Progressive Disease Line of Therapy: Third Line  Intent of Therapy: Non-Curative / Palliative Intent, Discussed with Patient

## 2019-10-17 NOTE — Patient Instructions (Signed)
[place] at [facility] Discharge Instructions  You were seen today by Dr. Delton Coombes. He went over your recent results. The possible treatments at this point include combination chemotherapy (topotecan) and immunotherapy (Keytruda), or just [place]. Side effects include fatigue from both, though topotecan causes greater fatigue than Keytruda. Dr. Delton Coombes will see you back in 2 weeks for labs, initiating treatment and follow up.   Thank you for choosing [place] at [facility] to provide your oncology and hematology care.  To afford each patient quality time with our provider, please arrive at least 15 minutes before your scheduled appointment time.   If you have a lab appointment with the [facility] please come in thru the Main Entrance and check in at the main information desk  You need to re-schedule your appointment should you arrive 10 or more minutes late.  We strive to give you quality time with our providers, and arriving late affects you and other patients whose appointments are after yours.  Also, if you no show three or more times for appointments you may be dismissed from the clinic at the providers discretion.     Again, thank you for choosing [facility].  Our hope is that these requests will decrease the amount of time that you wait before being seen by our physicians.       _____________________________________________________________  Should you have questions after your visit to [facility], please contact our office at [phone] between the hours of 8:00 a.m. and 4:30 p.m.  Voicemails left after 4:00 p.m. will not be returned until the following business day.  For prescription refill requests, have your pharmacy contact our office and allow 72 hours.    Cancer Center Support Programs:   > Cancer Support Group  2nd Tuesday of the month 1pm-2pm, Journey Room

## 2019-10-17 NOTE — Progress Notes (Signed)
OFF PATHWAY REGIMEN - Ovarian  No Change  Continue With Treatment as Ordered.  Original Decision Date/Time: 04/09/2019 10:46   OFF00015:Gemcitabine 1,000 mg/m2 IV D1,8,15 q28 Days:   A cycle is every 28 days:     Gemcitabine   **Always confirm dose/schedule in your pharmacy ordering system**  Patient Characteristics: Recurrent or Progressive Disease, Third Line, Platinum Resistant or < 6 Months Since Last Platinum Therapy Therapeutic Status: Recurrent or Progressive Disease BRCA Mutation Status: Absent Line of Therapy: Third Line  Intent of Therapy: Non-Curative / Palliative Intent, Discussed with Patient

## 2019-10-18 ENCOUNTER — Telehealth: Payer: Self-pay | Admitting: Family Medicine

## 2019-10-18 MED ORDER — HYDROMORPHONE HCL 2 MG PO TABS: 2.0000 mg | ORAL_TABLET | Freq: Every day | ORAL | 0 refills | Status: DC | PRN

## 2019-10-18 NOTE — Telephone Encounter (Signed)
[facility]) - [place], [address], [place] [address] Phone: [phone] Fax: [phone]  They are calling in reference to a medication that you sent to them today for HYDROmorphone (DILAUDID) 2 MG tablet  The pharmacy said that she normally gets this Rx refilled locally and just wanted to make sure that you're okay with refilling this medication through mail order  They don't have a problem filling this medication they just want to make sure that it's okay to fill mail order instead of locally.  They also need to know how long is 30 tablets supposed to last the patient since it is as needed.  Please contact them back at [phone]

## 2019-10-20 NOTE — Patient Instructions (Addendum)
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  You will be treated in the clinic every 3 weeks with an immunotherapy medication called pembrolizumab Beryle Flock).  The intent of treatment is to help control your cancer, prevent it from spreading further, and to alleviate any symptoms you may be having related to your disease.  You will see the doctor regularly throughout treatment.  We will obtain blood work from you prior to every treatment and monitor your results to make sure it is safe to give your treatment. The doctor monitors your response to treatment by the way you are feeling, your blood work, and by obtaining scans periodically.  There will be wait times while you are here for treatment.  It will take about 30 minutes to 1 hour for your lab work to result.  Then there will be wait times while pharmacy mixes your medications.    Pembrolizumab Beryle Flock)   About This Drug Pembrolizumab is used to treat cancer. It is given in the vein (IV).  It takes 30 minutes to infuse.  Possible Side Effects  . Nausea . Diarrhea (loose bowel movements) . Constipation (not able to move bowels) . Pain in your abdomen . Tiredness . Fever . Decreased appetite (decreased hunger) . Muscle and bone pain . Trouble breathing . Cough . Rash . Itching  Note: Each of the side effects above was reported in 20% or greater of patients treated with pembrolizumab. Your side effects may be different if you are receiving pembrolizumab in combination with another chemotherapy agent. Not all possible side effects are included above.  Warnings and Precautions  . This drug works with your immune system and can cause inflammation in any of your organs and tissues and can change how they work. This may put you at risk for developing serious medical problems, which can be life-threatening.  . Inflammation in the colon (colitis), which can be  life-threatening - symptoms are loose bowel movements (diarrhea) stomach cramping, and sometimes blood in the bowel movements  . Changes in liver function  . Changes in kidney function  . Inflammation (swelling) of the lungs, which can be life-threatening. You may have a dry cough or trouble breathing.  . This drug may affect some of your hormone glands (especially the thyroid, adrenals, pituitary and pancreas).  . Blood sugar levels may change, and you may develop diabetes. If you already have diabetes, changes may need to be made to your diabetes medication.  . Severe allergic skin reaction, which can be life-threatening. You may develop blisters on your skin that are filled with fluid or a severe red rash all over your body that may be painful.  . Increased risk of organ rejection in patients who have received donor organs  . Increased risk of complications in patients who will undergo a stem cell transplant after receiving pembrolizumab.  . While you are getting this drug in your vein (IV), you may have a reaction to the drug. Your nurse ill check you closely for these signs: fever or shaking chills, flushing, facial swelling, feeling dizzy, headache, trouble breathing, rash, itching, chest tightness, or chest pain. These reactions may occur after your infusion. If this happens, call 911 for emergency care.  Note: Some of the side effects above are very rare. If you have concerns and/or questions, please discuss them with your medical team.  Important Information . This drug may be present in the saliva, tears, sweat, urine, stool,  vomit, semen, and vaginal secretions. Talk to your doctor and/or your nurse about the necessary precautions to take during this time.  Treating Side Effects  . Drink plenty of fluids (a minimum of eight glasses per day is recommended).  . If you throw up or have loose bowel movements, you should drink more fluids so that you do not become dehydrated (lack  of water in the body from losing too much fluid).  . To help with nausea and vomiting, eat small, frequent meals instead of three large meals a day. Choose foods and drinks that are at room temperature. Ask your nurse or doctor about other helpful tips and medicine that is available to help stop or lessen these symptoms.  . If you have diarrhea, eat low-fiber foods that are high in protein and calories and avoid foods that can irritate your digestive tracts or lead to cramping.  . If you are not able to move your bowels, check with your doctor or nurse before you use any enemas, laxatives, or suppositories.  . Ask your doctor or nurse about medicines that are available to help stop or lessen constipation or diarrhea.  . To help with decreased appetite, eat small, frequent meals. Eat foods high in calories and protein, such as meat, poultry, fish, dry beans, tofu, eggs, nuts, milk, yogurt, cheese, ice cream, pudding, and nutritional supplements.  . Consider using sauces and spices to increase taste. Daily exercise, with your doctor's approval, may increase your appetite.  . Manage tiredness by pacing your activities for the day. Be sure to include periods of rest between energy-draining activities.  Marland Kitchen Keeping your pain under control is important to your wellbeing. Please tell your doctor or nurse if you are experiencing pain.  . If you have diabetes, keep good control of your blood sugar level. Tell your nurse or your doctor if your glucose levels are higher or lower than normal.  . If you get a rash do not put anything on it unless your doctor or nurse says you may. Keep the area around the rash clean and dry. Ask your doctor for medicine if your rash bothers you.  . Moisturize your skin several times a day  . Avoid sun exposure and apply sunscreen routinely when outdoors  . Infusion reactions may happen after your infusion. If this happens, call 911 for emergency care.   Food and Drug  Interactions  . There are no known interactions of pembrolizumab with food.  . This drug may interact with other medicines. Tell your doctor and pharmacist about all the prescription and over-the-counter medicines and dietary supplements (vitamins, minerals, herbs and others) that you are taking at this time. Also, check with your doctor or pharmacist before starting any new prescription or over-the-counter medicines, or dietary supplements to make sure that there are no interactions.  When to Call the Doctor  Call your doctor or nurse if you have any of the following symptoms and/or any new or unusual symptoms:  . Fever of 100.4 F (38 C) or higher  . Chills  . Headache that does not go away  . Tiredness that interferes with your daily activities  . Feeling dizzy or lightheaded  . Wheezing or trouble breathing  . Chest pain  . Dry cough  . Coughing up yellow, green or bloody mucus  . Nausea that stops you from eating or drinking, and/or that is not relieved by prescribed medicines  . Lasting loss of appetite or rapid weight loss of  five pounds in a week  . Diarrhea, 4 times in one day or diarrhea with lack of strength or a feeling of being dizzy  . Blood in your stool  . No bowel movement for 3 days or when you feel uncomfortable  . Extreme weakness that interferes with normal activities  . Decreased urine  . Abnormal blood sugar  . Unusual thirst, passing urine often, headache, sweating, shakiness, irritability  . A new rash and/or itching  . Rash that is not relieved by prescribed medicines  . Pain that does not go away or is not relieved by prescribed medicines, especially in the upper right abdomen  . Flu-like symptoms: fever, headache, muscle and joint aches, and fatigue (low energy, feeling weak)   . Signs of liver problems: dark urine, pale bowel movements, bad stomach pain, feeling very tired and weak, unusual itching, or yellowing of the eyes or  skin  . Signs of infusion reactions such as fever or shaking chills, flushing, facial swelling, feeling dizzy, headache, trouble breathing, rash, itching, chest tightness, or chest pain. If this happens, call 911 for emergency care.  . If you think you are pregnant  Reproduction Warnings  . Pregnancy warning: This drug may have harmful effects on the unborn baby. Women of childbearing potential should use effective methods of birth control during your cancer treatment and for at least 4 months after treatment. Let your doctor know right away if you think you may be pregnant.  . Breast feeding warning: It is not known if this drug passes into breast milk. For this reason, women should not breastfeed during treatment and for 4 months after treatment because this drug could enter the breast milk and cause harm to a breastfeeding baby.  . Fertility warning: Human fertility studies have not been done with this drug. Talk with your doctor or nurse if you plan to have children.   SELF CARE ACTIVITIES WHILE ON IMMUNOTHERAPY:  Hydration Increase your fluid intake 48 hours prior to treatment and drink at least 8 to 12 cups (64 ounces) of water/decaffeinated beverages per day after treatment. You can still have your cup of coffee or soda but these beverages do not count as part of your 8 to 12 cups that you need to drink daily. No alcohol intake.  Medications Continue taking your normal prescription medication as prescribed.  If you start any new herbal or new supplements please let us know first to make sure it is safe.  Mouth Care Have teeth cleaned professionally before starting treatment. Keep dentures and partial plates clean. Use soft toothbrush and do not use mouthwashes that contain alcohol. Biotene is a good mouthwash that is available at most pharmacies or may be ordered by calling [phone]. Use warm salt water gargles (1 teaspoon salt per 1 quart warm water) before and after meals and  at bedtime. Or you may rinse with 2 tablespoons of three-percent hydrogen peroxide mixed in eight ounces of water. If you are still having problems with your mouth or sores in your mouth please call the clinic. If you need dental work, please let the doctor know before you go for your appointment so that we can coordinate the best possible time for you in regards to your chemo regimen. You need to also let your dentist know that you are actively taking chemo. We may need to do labs prior to your dental appointment.  Skin Care Always use sunscreen that has not expired and with SPF Nancy Fetter Protection  Factor) of 50 or higher. Wear hats to protect your head from the sun. Remember to use sunscreen on your hands, ears, face, & feet.  Use good moisturizing lotions such as udder cream, eucerin, or even Vaseline. Some chemotherapies can cause dry skin, color changes in your skin and nails.    . Avoid long, hot showers or baths. . Use gentle, fragrance-free soaps and laundry detergent. . Use moisturizers, preferably creams or ointments rather than lotions because the thicker consistency is better at preventing skin dehydration. Apply the cream or ointment within 15 minutes of showering. Reapply moisturizer at night, and moisturize your hands every time after you wash them.   Infection Prevention Please wash your hands for at least 30 seconds using warm soapy water. Handwashing is the #1 way to prevent the spread of germs. Stay away from sick people or people who are getting over a cold. If you develop respiratory systems such as green/yellow mucus production or productive cough or persistent cough let us know and we will see if you need an antibiotic. It is a good idea to keep a pair of gloves on when going into grocery stores/[employer] to decrease your risk of coming into contact with germs on the carts, etc. Carry alcohol hand gel with you at all times and use it frequently if out in public. If your temperature  reaches 100.5 or higher please call the clinic and let us know.  If it is after hours or on the weekend please go to the ER if your temperature is over 100.4.  Please have your own personal thermometer at home to use.    Sex and bodily fluids If you are going to have sex, a condom must be used to protect the person that isn't taking immunotherapy. For a few days after treatment, immunotherapy can be excreted through your bodily fluids.  When using the toilet please close the lid and flush the toilet twice.  Do this for a few day after you have had immunotherapy.   Contraception It is not known for sure whether or not immunotherapy drugs can be passed on through semen or secretions from the vagina. Because of this some doctors advise people to use a barrier method if you have sex during treatment. This applies to vaginal, anal or oral sex.  Generally, doctors advise a barrier method only for the time you are actually having the treatment and for about a week after your treatment.  Advice like this can be worrying, but this does not mean that you have to avoid being intimate with your partner. You can still have close contact with your partner and continue to enjoy sex.  Animals If you have cats or birds we just ask that you not change the litter or change the cage.  Please have someone else do this for you while you are on immunotherapy.   Food Safety During and After Cancer Treatment Food safety is important for people both during and after cancer treatment. Cancer and cancer treatments, such as chemotherapy, radiation therapy, and stem cell/bone marrow transplantation, often weaken the immune system. This makes it harder for your body to protect itself from foodborne illness, also called food poisoning. Foodborne illness is caused by eating food that contains harmful bacteria, parasites, or viruses.  Foods to avoid Some foods have a higher risk of becoming tainted with bacteria. These  include: Marland Kitchen Unwashed fresh fruit and vegetables, especially leafy vegetables that can hide dirt and other contaminants  Raw sprouts,  such as alfalfa sprouts . Raw or undercooked beef, especially ground beef, or other raw or undercooked meat and poultry . Fatty, fried, or spicy foods immediately before or after treatment.  These can sit heavy on your stomach and make you feel nauseous. . Raw or undercooked shellfish, such as oysters. . Sushi and sashimi, which often contain raw fish.  . Unpasteurized beverages, such as unpasteurized fruit juices, raw milk, raw yogurt, or cider . Undercooked eggs, such as soft boiled, over easy, and poached; raw, unpasteurized eggs; or foods made with raw egg, such as homemade raw cookie dough and homemade mayonnaise  Simple steps for food safety  Shop smart. . Do not buy food stored or displayed in an unclean area. . Do not buy bruised or damaged fruits or vegetables. . Do not buy cans that have cracks, dents, or bulges. . Pick up foods that can spoil at the end of your shopping trip and store them in a cooler on the way home.  Prepare and clean up foods carefully. . Rinse all fresh fruits and vegetables under running water, and dry them with a clean towel or paper towel. . Clean the top of cans before opening them. . After preparing food, wash your hands for 20 seconds with hot water and soap. Pay special attention to areas between fingers and under nails. . Clean your utensils and dishes with hot water and soap. Marland Kitchen Disinfect your kitchen and cutting boards using 1 teaspoon of liquid, unscented bleach mixed into 1 quart of water.    Dispose of old food. . Eat canned and packaged food before its expiration date (the "use by" or "best before" date). . Consume refrigerated leftovers within 3 to 4 days. After that time, throw out the food. Even if the food does not smell or look spoiled, it still may be unsafe. Some bacteria, such as Listeria, can grow even on  foods stored in the refrigerator if they are kept for too long.  Take precautions when eating out. . At restaurants, avoid buffets and salad bars where food sits out for a long time and comes in contact with many people. Food can become contaminated when someone with a virus, often a norovirus, or another "bug" handles it. . Put any leftover food in a "to-go" container yourself, rather than having the server do it. And, refrigerate leftovers as soon as you get home. . Choose restaurants that are clean and that are willing to prepare your food as you order it cooked.    SYMPTOMS TO REPORT AS SOON AS POSSIBLE AFTER TREATMENT:   FEVER GREATER THAN 100.4 F  CHILLS WITH OR WITHOUT FEVER  NAUSEA AND VOMITING THAT IS NOT CONTROLLED WITH YOUR NAUSEA MEDICATION  UNUSUAL SHORTNESS OF BREATH  UNUSUAL BRUISING OR BLEEDING  TENDERNESS IN MOUTH AND THROAT WITH OR WITHOUT PRESENCE OF ULCERS  URINARY PROBLEMS  BOWEL PROBLEMS  UNUSUAL RASH     Wear comfortable clothing and clothing appropriate for easy access to any Portacath or PICC line. Let us know if there is anything that we can do to make your therapy better!   What to do if you need assistance after hours or on the weekends: CALL [phone].  HOLD on the line, do not hang up.  You will hear multiple messages but at the end you will be connected with a nurse triage line.  They will contact the doctor if necessary.  Most of the time they will be able to assist you.  Do not call the hospital operator.     I have been informed and understand all of the instructions given to me and have received a copy. I have been instructed to call the clinic [phone] or my family physician as soon as possible for continued medical care, if indicated. I do not have any more questions at this time but understand that I may call the [facility] or the Patient Navigator at [phone] during office hours should I have questions or need assistance  in obtaining follow-up care.

## 2019-10-22 ENCOUNTER — Telehealth: Payer: Self-pay | Admitting: Family Medicine

## 2019-10-22 ENCOUNTER — Inpatient Hospital Stay (HOSPITAL_COMMUNITY): Payer: PPO | Admitting: General Practice

## 2019-10-22 DIAGNOSIS — C561 Malignant neoplasm of right ovary: Secondary | ICD-10-CM

## 2019-10-22 NOTE — Telephone Encounter (Signed)
Pt is calling to get clarification on Rx hydromorphone (DILAUDID) 2 MG and would like to have a call back.

## 2019-10-22 NOTE — Progress Notes (Addendum)
Haley [name]  Initial Assessment   Haley [name] is a 74 y.o. year old female diagnosed w Platinum resistant high-grade serous ovarian carcinoma She was initially diagnosed in 12/2017 and has had disease progression despite several chemotherapy treatment regimens.  She will begin immunotherapy treatment at [facility] soon   Clinical Social [name] was referred by nurse navigator for assessment of psychosocial needs.   SDOH (Social Determinants of Health) assessments performed: Yes   Distress Screen completed: No   Family/Social Information:  . Housing Arrangement: patient lives with husband, "he will be 18 tomorrow and it depends on how he feels" , unsure how much he can help her if needed.   . Family members/support persons in your life? "I haven't needed any help, I do pretty well on my own."  Would ask daughter for help if needed.  Also has grandchildren or niece.  [employer] program through her insurance can help her - will shop for her/run errands/transport if needed.  She has not used this service to date.  "Its nice to know they are there - they have a Radio broadcast assistant that will come out if I want it." . Transportation concerns: She is driving herself to appointments and plans to continue to do this.  Husband and daughter would go if she is meeting w doctor.  Getting to [facility] is not a concern . Employment: Retired  . Marland Kitchen Income source: retirement income . Financial concerns: No. Depends on both her income and spouse's for support.  o Type of concern: None . Food access concerns: No . Religious or spiritual practice: none mentioned . Medication Concerns: "I am in the donut hole now", hard to afford medication especially Xarelto. Has asked doctors if she could switch to lower cost medication.  Frustrated w high cost of medication she needs.   . Services Currently in place:  [employer]/care management service w her Medicare plan  Coping/ Adjustment to diagnosis: . Patient understands  treatment plan and what happens next? Will begin IV immunotherapy, "I cant take none of the pills for cancer."   . Concerns about diagnosis and/or treatment: How I will pay for the services I need - concerns not related to cancer - she is aware that she will be starting immunotherapy w Keytruda.   . Patient reported stressors: Finances related to medications for atrial fibrillation . Hopes and priorities:  "I know there aint no cure for it, I dont know how long I am even going to live."  When left [facility] in June 2021, "I had less than 6 months to live."  Had "Barnett Applebaum", a "nurse educator" come to the house from [place].  Receives ongoing support from [employer].  Very clear that she wants to continue treatment.  Currently "I feel pretty decent, I feel good, I don't have any pain."  Has been told she may need to "cut back on my gabapentin, I feel woozy."   . Patient enjoys time with family/ friends and likes to get out in yard, do things in the house, socialize w friends, 'trying to get my husband to go on a trip to [place]."  Likes to travel and "explore." . Current coping skills/ strengths: Average or above average intelligence, Capable of independent living, General fund of knowledge and Motivation for treatment/growth    SUMMARY: Current SDOH Barriers:  Marland Kitchen Medication procurement  Interventions: . Discussed common feeling and emotions when being diagnosed with cancer, and the importance of support during treatment . Informed patient of  the support team roles and support services at [facility] . Provided CSW contact information and encouraged patient to call with any questions or concerns . Patient interviewed and appropriate assessments performed   Follow Up Plan: no SW follow up needed Patient verbalizes understanding of plan: Yes    Crisman [name], [facility]

## 2019-10-22 NOTE — Telephone Encounter (Signed)
LMOVM stating that unsure as to what clarification is needed on the medication other than take 1 daily as needed but may call back if has any other questions or concerns/thx dmf

## 2019-10-23 ENCOUNTER — Telehealth: Payer: Self-pay | Admitting: Family Medicine

## 2019-10-23 NOTE — Progress Notes (Signed)
  Chronic Care Management   Outreach Note  10/23/2019 Name: Haley [name] [facility] MRN: [id number] DOB: 18-Dec-1945  Referred by: Martinique, Bonnell G, MD Reason for referral : No chief complaint on file.   An unsuccessful telephone outreach was attempted today. The patient was referred to the pharmacist for assistance with care management and care coordination.   Follow Up Plan:   Carley Perdue [employer] Scheduler

## 2019-10-25 NOTE — Telephone Encounter (Signed)
Rx was started by oncologist in case she had pain. She has reported to me that she has no pain and that she does not take medication often. If patient wants medication from her mail order, it  is okay to do so. At this time Dilaudid 2 mg 30 tablets should last more than 30 days. We will reevaluate next visit and adjust number of tablets accordingly.  Thanks, BJ

## 2019-10-25 NOTE — Telephone Encounter (Signed)
Rx needed to go to [place].

## 2019-10-25 NOTE — Addendum Note (Signed)
Addended by: Rodrigo Ran on: 10/25/2019 09:20 AM   Modules accepted: Orders

## 2019-10-29 ENCOUNTER — Telehealth: Payer: Self-pay | Admitting: Family Medicine

## 2019-10-29 ENCOUNTER — Other Ambulatory Visit: Payer: Self-pay | Admitting: Family Medicine

## 2019-10-29 ENCOUNTER — Other Ambulatory Visit: Payer: Self-pay

## 2019-10-29 ENCOUNTER — Inpatient Hospital Stay (HOSPITAL_COMMUNITY): Payer: PPO

## 2019-10-29 DIAGNOSIS — Z7189 Other specified counseling: Secondary | ICD-10-CM

## 2019-10-29 DIAGNOSIS — C561 Malignant neoplasm of right ovary: Secondary | ICD-10-CM

## 2019-10-29 DIAGNOSIS — M79606 Pain in leg, unspecified: Secondary | ICD-10-CM

## 2019-10-29 MED ORDER — GABAPENTIN 300 MG PO CAPS: 300.0000 mg | ORAL_CAPSULE | Freq: Two times a day (BID) | ORAL | 3 refills | Status: AC

## 2019-10-29 MED ORDER — HYDROMORPHONE HCL 2 MG PO TABS: 2.0000 mg | ORAL_TABLET | Freq: Every day | ORAL | 0 refills | Status: DC | PRN

## 2019-10-29 NOTE — Telephone Encounter (Signed)
Do you prescribe 90 days at a time or just 30 days for this?

## 2019-10-29 NOTE — Telephone Encounter (Signed)
Noted, Rx sent in.

## 2019-10-29 NOTE — Telephone Encounter (Signed)
30 days supply at the time. Thanks, BJ

## 2019-10-29 NOTE — Telephone Encounter (Signed)
Pt is calling in stating that she would like to see if Dr. Martinique would take over her gabapentin (NEURONTIN) 300 MG (and would like to have a #90) due to her not having a doctor (Dr. Alvy Bimler, [facility] is no longer her doctor and she does not see her any longer)   Pharm:  [facility] in [place].

## 2019-10-29 NOTE — Progress Notes (Signed)
Immunotherapy education packet given and discussed with pt in detail.  Discussed diagnosis, staging, tx regimen, and intent of tx.  Reviewed immunotherapy medications and side effects.  Instructed on how to manage side effects at home, and when to call the clinic.  Importance of fever/chills discussed with pt and family. Discussed precautions to implement at home after receiving tx, as well as self care strategies. Phone numbers provided for clinic during regular working hours, also how to reach the clinic after hours and on weekends. Pt provided the opportunity to ask questions - all questions answered to pt's satisfaction. Consent obtained and copy given to pt.

## 2019-10-29 NOTE — Telephone Encounter (Signed)
Prescription sent. Thanks, BJ 

## 2019-10-31 NOTE — Progress Notes (Signed)
.   Pharmacist Chemotherapy Monitoring - Initial Assessment    Anticipated start date: 11/04/19   Regimen:  . Are orders appropriate based on the patient's diagnosis, regimen, and cycle? Yes . Does the plan date match the patient's scheduled date? Yes . Is the sequencing of drugs appropriate? Yes . Are the premedications appropriate for the patient's regimen? Yes . Prior Authorization for treatment is: Approved o If applicable, is the correct biosimilar selected based on the patient's insurance? not applicable  Organ Function and Labs: Marland Kitchen Are dose adjustments needed based on the patient's renal function, hepatic function, or hematologic function? No . Are appropriate labs ordered prior to the start of patient's treatment? Yes . Other organ system assessment, if indicated: N/A . The following baseline labs, if indicated, have been ordered: pembrolizumab: baseline TSH +/- T4  Dose Assessment: . Are the drug doses appropriate? Yes  . Are the following correct: o Drug concentrations Yes o IV fluid compatible with drug Yes o Administration routes Yes o Timing of therapy Yes . If applicable, does the patient have documented access for treatment and/or plans for port-a-cath placement? not applicable . If applicable, have lifetime cumulative doses been properly documented and assessed? not applicable Lifetime Dose Tracking  . Carboplatin: 2,480 mg = 0.01 % of the maximum lifetime dose of 999,999,999 mg  . Liposomal Doxorubicin: 172.997 mg/m2 (286 mg) = 38.44 % of the maximum lifetime dose of 450 mg/m2  o   Toxicity Monitoring/Prevention: . The patient has the following take home antiemetics prescribed: N/A . The patient has the following take home medications prescribed: N/A . Medication allergies and previous infusion related reactions, if applicable, have been reviewed and addressed. Yes . The patient's current medication list has been assessed for drug-drug interactions with their  chemotherapy regimen. no significant drug-drug interactions were identified on review.  Order Review: . Are the treatment plan orders signed? No . Is the patient scheduled to see a provider prior to their treatment? Yes  I verify that I have reviewed each item in the above checklist and answered each question accordingly.  Wynona Neat 10/31/2019 2:34 PM

## 2019-11-04 ENCOUNTER — Ambulatory Visit (HOSPITAL_COMMUNITY): Payer: PPO

## 2019-11-04 ENCOUNTER — Ambulatory Visit (HOSPITAL_COMMUNITY): Payer: PPO | Admitting: Hematology

## 2019-11-04 ENCOUNTER — Other Ambulatory Visit (HOSPITAL_COMMUNITY): Payer: PPO

## 2019-11-12 ENCOUNTER — Telehealth: Payer: Self-pay | Admitting: Cardiovascular Disease

## 2019-11-12 NOTE — Telephone Encounter (Signed)
Patient states during last appointment with Dr. Claiborne Billings she was advised to take Losartan for 2 weeks and then have lab work completed. However, she was never able to have it done. She would like to know if she still needs to have lab work. Please advise.

## 2019-11-12 NOTE — Telephone Encounter (Signed)
Spoke with husband (patient is not home). Advised that she should still complete lab work (metabolic panel, lipid panel) and will need to be fasting. Advised can be completed at [facility] in [place]. MyChart message sent

## 2019-11-13 ENCOUNTER — Telehealth: Payer: Self-pay | Admitting: Family Medicine

## 2019-11-13 DIAGNOSIS — I480 Paroxysmal atrial fibrillation: Secondary | ICD-10-CM | POA: Diagnosis not present

## 2019-11-13 DIAGNOSIS — I10 Essential (primary) hypertension: Secondary | ICD-10-CM | POA: Diagnosis not present

## 2019-11-13 DIAGNOSIS — Z7901 Long term (current) use of anticoagulants: Secondary | ICD-10-CM | POA: Diagnosis not present

## 2019-11-13 DIAGNOSIS — Z79899 Other long term (current) drug therapy: Secondary | ICD-10-CM | POA: Diagnosis not present

## 2019-11-13 NOTE — Progress Notes (Signed)
  Chronic Care Management   Note  11/13/2019 Name: Glynn Freas [facility] MRN: [id number] DOB: Jul 12, 1945  Frady Taddeo Flam is a 74 y.o. year old female who is a primary care patient of Martin[place], Malka So, MD. I reached out to [facility] by phone today in response to a referral sent by Ms. Judeth Porch Flaharty's PCP, Martinique, Franchelle G, MD.   Ms. Tregre was given information about Chronic Care Management services today including:  1. CCM service includes personalized support from designated clinical staff supervised by her physician, including individualized plan of care and coordination with other care providers 2. 24/7 contact phone numbers for assistance for urgent and routine care needs. 3. Service will only be billed when office clinical staff spend 20 minutes or more in a month to coordinate care. 4. Only one practitioner may furnish and bill the service in a calendar month. 5. The patient may stop CCM services at any time (effective at the end of the month) by phone call to the office staff.   Patient agreed to services and verbal consent obtained.   Follow up plan:   Carley Perdue [employer] Scheduler

## 2019-11-14 LAB — COMPREHENSIVE METABOLIC PANEL
ALT: 18 IU/L (ref 0–32)
AST: 22 IU/L (ref 0–40)
Albumin/Globulin Ratio: 1.2 (ref 1.2–2.2)
Albumin: 4.1 g/dL (ref 3.7–4.7)
Alkaline Phosphatase: 104 IU/L (ref 44–121)
BUN/Creatinine Ratio: 14 (ref 12–28)
BUN: 13 mg/dL (ref 8–27)
Bilirubin Total: 0.4 mg/dL (ref 0.0–1.2)
CO2: 25 mmol/L (ref 20–29)
Calcium: 9.4 mg/dL (ref 8.7–10.3)
Chloride: 102 mmol/L (ref 96–106)
Creatinine, Ser: 0.92 mg/dL (ref 0.57–1.00)
GFR calc Af Amer: 70 mL/min/{1.73_m2} (ref >59)
GFR calc non Af Amer: 61 mL/min/{1.73_m2} (ref >59)
Globulin, Total: 3.5 g/dL (ref 1.5–4.5)
Glucose: 92 mg/dL (ref 65–99)
Potassium: 4.8 mmol/L (ref 3.5–5.2)
Sodium: 139 mmol/L (ref 134–144)
Total Protein: 7.6 g/dL (ref 6.0–8.5)

## 2019-11-14 LAB — LIPID PANEL
Chol/HDL Ratio: 2.6 ratio (ref 0.0–4.4)
Cholesterol, Total: 186 mg/dL (ref 100–199)
HDL: 71 mg/dL (ref >39)
LDL Chol Calc (NIH): 90 mg/dL (ref 0–99)
Triglycerides: 147 mg/dL (ref 0–149)
VLDL Cholesterol Cal: 25 mg/dL (ref 5–40)

## 2019-11-18 ENCOUNTER — Inpatient Hospital Stay (HOSPITAL_COMMUNITY): Payer: PPO | Admitting: Hematology

## 2019-11-18 ENCOUNTER — Inpatient Hospital Stay (HOSPITAL_COMMUNITY): Payer: PPO

## 2019-11-18 ENCOUNTER — Inpatient Hospital Stay (HOSPITAL_COMMUNITY): Payer: PPO | Attending: Hematology

## 2019-11-18 ENCOUNTER — Other Ambulatory Visit: Payer: Self-pay

## 2019-11-18 VITALS — BP 137/68 | HR 50 | Temp 97.0°F | Resp 18 | Wt 155.6 lb

## 2019-11-18 VITALS — BP 126/55 | HR 49 | Resp 18

## 2019-11-18 DIAGNOSIS — Z79899 Other long term (current) drug therapy: Secondary | ICD-10-CM | POA: Insufficient documentation

## 2019-11-18 DIAGNOSIS — C786 Secondary malignant neoplasm of retroperitoneum and peritoneum: Secondary | ICD-10-CM | POA: Diagnosis not present

## 2019-11-18 DIAGNOSIS — C561 Malignant neoplasm of right ovary: Secondary | ICD-10-CM

## 2019-11-18 DIAGNOSIS — C773 Secondary and unspecified malignant neoplasm of axilla and upper limb lymph nodes: Secondary | ICD-10-CM | POA: Diagnosis not present

## 2019-11-18 DIAGNOSIS — Z7189 Other specified counseling: Secondary | ICD-10-CM

## 2019-11-18 DIAGNOSIS — Z5112 Encounter for antineoplastic immunotherapy: Secondary | ICD-10-CM | POA: Diagnosis not present

## 2019-11-18 DIAGNOSIS — M79606 Pain in leg, unspecified: Secondary | ICD-10-CM

## 2019-11-18 LAB — COMPREHENSIVE METABOLIC PANEL
ALT: 19 U/L (ref 0–44)
AST: 22 U/L (ref 15–41)
Albumin: 3.5 g/dL (ref 3.5–5.0)
Alkaline Phosphatase: 64 U/L (ref 38–126)
Anion gap: 6 (ref 5–15)
BUN: 12 mg/dL (ref 8–23)
CO2: 26 mmol/L (ref 22–32)
Calcium: 9.1 mg/dL (ref 8.9–10.3)
Chloride: 104 mmol/L (ref 98–111)
Creatinine, Ser: 0.79 mg/dL (ref 0.44–1.00)
GFR, Estimated: >60 mL/min (ref >60)
Glucose, Bld: 92 mg/dL (ref 70–99)
Potassium: 4.1 mmol/L (ref 3.5–5.1)
Sodium: 136 mmol/L (ref 135–145)
Total Bilirubin: 0.5 mg/dL (ref 0.3–1.2)
Total Protein: 6.8 g/dL (ref 6.5–8.1)

## 2019-11-18 LAB — CBC WITH DIFFERENTIAL/PLATELET
Abs Immature Granulocytes: 0.05 10*3/uL (ref 0.00–0.07)
Basophils Absolute: 0.1 10*3/uL (ref 0.0–0.1)
Basophils Relative: 1 %
Eosinophils Absolute: 0.4 10*3/uL (ref 0.0–0.5)
Eosinophils Relative: 7 %
HCT: 35 % — ABNORMAL LOW (ref 36.0–46.0)
Hemoglobin: 11 g/dL — ABNORMAL LOW (ref 12.0–15.0)
Immature Granulocytes: 1 %
Lymphocytes Relative: 29 %
Lymphs Abs: 1.8 10*3/uL (ref 0.7–4.0)
MCH: 29.4 pg (ref 26.0–34.0)
MCHC: 31.4 g/dL (ref 30.0–36.0)
MCV: 93.6 fL (ref 80.0–100.0)
Monocytes Absolute: 0.5 10*3/uL (ref 0.1–1.0)
Monocytes Relative: 9 %
Neutro Abs: 3.3 10*3/uL (ref 1.7–7.7)
Neutrophils Relative %: 53 %
Platelets: 176 10*3/uL (ref 150–400)
RBC: 3.74 MIL/uL — ABNORMAL LOW (ref 3.87–5.11)
RDW: 14.6 % (ref 11.5–15.5)
WBC: 6.2 10*3/uL (ref 4.0–10.5)
nRBC: 0 % (ref 0.0–0.2)

## 2019-11-18 LAB — TSH: TSH: 2.823 u[IU]/mL (ref 0.350–4.500)

## 2019-11-18 MED ORDER — SODIUM CHLORIDE 0.9 % IV SOLN
200.0000 mg | Freq: Once | INTRAVENOUS | Status: AC
  Administered 2019-11-18: 200 mg via INTRAVENOUS
  Filled 2019-11-18: qty 8

## 2019-11-18 MED ORDER — SODIUM CHLORIDE 0.9 % IV SOLN: Freq: Once | INTRAVENOUS | Status: AC

## 2019-11-18 MED ORDER — SODIUM CHLORIDE 0.9% FLUSH
10.0000 mL | INTRAVENOUS | Status: DC | PRN
  Administered 2019-11-18: 10 mL

## 2019-11-18 MED ORDER — HEPARIN SOD (PORK) LOCK FLUSH 100 UNIT/ML IV SOLN
500.0000 [IU] | Freq: Once | INTRAVENOUS | Status: AC | PRN
  Administered 2019-11-18: 500 [IU]

## 2019-11-18 NOTE — Progress Notes (Signed)
[place], [place] [address]   CLINIC:  Medical Oncology/Hematology  PCP:  Martinique, Jontae G, MD [address] / [place] [address] [phone]   REASON FOR VISIT:  Follow-up for right ovarian cancer  PRIOR THERAPY:  1. Neoadjuvant chemotherapy with carboplatin and paclitaxel x 3 cycles from 01/18/2018 to 03/02/2018. 2. BSO and omentectomy on 03/27/2018 followed by 3 more cycles of carboplatin and paclitaxel from 04/20/2018 to 05/31/2018. 3. Doxil and Avastin x 5 cycles from 07/16/2018 to 11/14/2018.  NGS Results: PD-L1 CPS 5%, Foundation 1 MS--stable, TMB 5 Muts/Mb  CURRENT THERAPY: Keytruda every 3 weeks  BRIEF ONCOLOGIC HISTORY:  Oncology History Overview Note  Neg for genetics blood work and HRD High grade serous, recurrent biopsy proven PD-L1 5%  Relapsed within a few months of finishing carbo/taxol, minimal response to Doxil/Avastin, progressed on Gemzar   Right ovarian epithelial cancer ([place])  01/06/2018 Imaging   Ct abdomen and pelvis 1. Complex cystic mass at the right adnexa, measuring 5.9 x 4.0 cm, with nodular components, concerning for primary ovarian malignancy. 2. Diffuse nodularity along the omentum at the left side of the abdomen, extending into the mesentery at the left mid abdomen, concerning for peritoneal carcinomatosis. 3. Wall thickening at the distal ileum adjacent to the ovarian mass; bowel loops appear somewhat adherent to the ovarian mass. Bowel infiltration with tumor cannot be excluded. No evidence of bowel obstruction at this time. 4. Small volume ascites within the abdomen and pelvis.  Aortic Atherosclerosis (ICD10-I70.0).   01/08/2018 Tumor Marker   Patient's tumor was tested for the following markers: CA-125. Results of the tumor marker test revealed 3004   01/15/2018 Imaging   Chest CT:  1. No active cardiopulmonary disease. 2. Aortic atherosclerosis without aneurysm or dissection. 3.  No large central pulmonary embolus.  CT AP:  1. Dilated fluid-filled loops of small bowel are redemonstrated slightly more extensive than on prior exam with transition point likely in the right adnexa adjacent to a complex cystic mass concerning for ovarian neoplasm given septations and soft tissue nodularity. This raises concern for early or partial SBO. This soft tissue mass measures 4.9 x 4 x 4.7 cm and has not changed since prior recent comparison. Additional short segmental area of luminal narrowing is noted in the right lower quadrant involving small bowel for which stigmata of peritoneal carcinomatosis or small-bowel metastatic implants might account for this. 2. Redemonstration of small volume of ascites predominantly in the upper abdomen surrounding the liver and spleen. 3. Redemonstration of thick bandlike omental thickening concerning for peritoneal carcinomatosis.    01/15/2018 Procedure   Successful ultrasound-guided diagnostic and therapeutic paracentesis yielding 1.5 liters of peritoneal fluid.    01/15/2018 Pathology Results   PERITONEAL/ASCITIC FLUID (SPECIMEN 1 OF 1 COLLECTED 01/18/18): MALIGNANT CELLS CONSISTENT WITH METASTATIC ADENOCARCINOMA. SEE COMMENT. COMMENT: THE MALIGNANT CELLS ARE POSITIVE FOR MOC-31, CYTOKERATIN 7, ESTROGEN RECEPTOR, PAX-8, AND WT-1. THEY ARE NEGATIVE FOR CALRETININ, CYTOKERATIN 5/6, AND CYTOKERATIN 20. THE PROFILE IS CONSISTENT WITH A PRIMARY GYNECOLOGIC CARCINOMA. THERE IS LIKELY SUFFICIENT TUMOR PRESENT, IF ADDITIONAL STUDIES ARE REQUESTED.   01/15/2018 - 02/02/2018 Hospital Admission   She was admitted to the hospital for SBO. She was treated with chemotherapy   01/18/2018 - 06/20/2018 Chemotherapy   The patient had carboplatin and taxol   02/08/2018 Cancer Staging   Staging form: Ovary, Fallopian Tube, and Primary Peritoneal Carcinoma, AJCC 8th Edition - Clinical: cT3, cN0, cM0 - Signed by Heath Lark,  MD on 02/08/2018   02/08/2018 Tumor Marker    Patient's tumor was tested for the following markers: CA-125. Results of the tumor marker test revealed 445   03/02/2018 Tumor Marker   Patient's tumor was tested for the following markers: CA-125. Results of the tumor marker test revealed 174   03/15/2018 Imaging   6.2 cm complex cystic right ovarian mass, compatible with malignant ovarian neoplasm, mildly progressive.  Associated peritoneal disease/omental caking beneath the anterior abdominal wall, mildly improved. Prior abdominal ascites is improved/resolved.  4.7 cm cystic left ovarian mass, without overt malignant features, grossly unchanged.  Mild bilateral hydroureteronephrosis, secondary to extrinsic compression, new.  Prior small bowel obstruction has improved/resolved.    03/27/2018 Pathology Results   1. Omentum, resection for tumor - OMENTUM: - HIGH GRADE SEROUS CARCINOMA. 2. Ovary, left - LEFT OVARY: - HIGH GRADE SEROUS CARCINOMA. - LEFT FALLOPIAN TUBE: - HIGH GRADE SEROUS CARCINOMA. 3. Adnexa - ovary +/- tube, neoplastic, right - RIGHT OVARY: - HIGH GRADE SEROUS CARCINOMA, 5.5 CM. - RIGHT FALLOPIAN TUBE: - HIGH GRADE SEROUS CARCINOMA. 4. Peritoneum, biopsy, nodule - PERITONEUM: - NODULES OF HIGH GRADE SEROUS CARCINOMA. 5. Peritoneum, resection for tumor, rectosigmoid - RECTOSIGMOID PERITONEUM: - HIGH GRADE SEROUS CARCINOMA. Microscopic Comment 3. OVARY or FALLOPIAN TUBE or PRIMARY PERITONEUM: Procedure: Bilateral salpingo-oophorectomy, omentectomy and peritoneal biopsies. Specimen Integrity: N/A. Tumor Site: Right ovary. Ovarian Surface Involvement (required only if applicable): Yes. Fallopian Tube Surface Involvement (required only if applicable): Yes. Tumor Size: 5.5 x 4.8 x 4.0 cm. Histologic Type: Serous carcinoma. Histologic Grade: High grade. Implants (required for advanced stage serous/seromucinous borderline tumors only): Omentum, left ovary and fallopian tube, rectosigmoid peritoneum and  peritoneum. Other Tissue/ Organ Involvement: Left ovary and fallopian tube, omentum and rectosigmoid peritoneum. Largest Extrapelvic Peritoneal Focus (required only if applicable): 10.6 cm, omentum. Peritoneal/Ascitic Fluid: N/A. Treatment Effect (required only for high-grade serous carcinomas): Minimal. Regional Lymph Nodes: No lymph nodes submitted. Pathologic Stage Classification (pTNM, AJCC 8th Edition): pT3c, pNX. Representative Tumor Block: 3A, 3B, 3D, 3E and 56F. Comment(s): There is a 5.5 cm in greatest dimension partially cystic high grade serous carcinoma involving the right adnexal specimen and the tumor is staged as a primary right ovarian carcinoma. The carcinoma also involves the left ovary as well as the left fallopian tube, the omentum, peritoneum and the rectosigmoid peritoneal specimens.   03/27/2018 Surgery   Preoperative Diagnosis: ovarian cancer, stage IIIC, metastatic to omentum, peritoneum, serosa of intestines   Procedure(s) Performed: 1. Exploratory laparotomy with bilateral salpingo-oophorectomy, omentectomy radical tumor debulking for ovarian cancer .  Surgeon: Luisa Dago, MD.   Specimens: Bilateral tubes / ovaries, omentum. Peritoneal nodules, rectosigmoid nodules.    Operative Findings: omental cake, miliary studding on diaphragm and bilateral paracolic gutters. Sigmoid colon densely adherent to left and right ovaries with tumor rind, ureters mildly dilated bilaterally due to retroperitoneal extension of the tumor towards ureters causing compression.    This represented an optimal cytoreduction (R1) with gross residual disease on the diaphragm that had been ablated and a thin tumor plaque on the sigmoid colon that was ablated. No residual disease >1cm remaining   05/07/2018 Genetic Testing   Negative genetic testing on the [facility] TumorNextHRD+ CancerNext Panel. The CancerNext gene panel offered by [employer] includes sequencing and rearrangement analysis for  the following 34 genes:   APC, ATM, BARD1, BMPR1A, BRCA1, BRCA2, BRIP1, CDH1, CDK4, CDKN2A, CHEK2, DICER1, EPCAM, GREM1, HOXB13, MLH1, MRE11A, MSH2, MSH6, MUTYH, NBN, NF1, PALB2, PMS2, POLD1, POLE,  PTEN, RAD50, RAD51C, RAD51D, SMAD4, SMARCA4, STK11, and TP53. Somatic genes analyzed through TumorNext-HRD: ATM, BARD1, BRCA1, BRCA2, BRIP1, CHEK2, MRE11A, NBN, PALB2, RAD51C, RAD51D. The report date is 05/07/2018.   05/11/2018 Tumor Marker   Patient's tumor was tested for the following markers: CA-125. Results of the tumor marker test revealed 60.5   07/02/2018 Imaging   1. Mild right pericardiophrenic adenopathy is mildly increased, suspicious for metastatic nodes. No abdominopelvic adenopathy. 2. Small left peritoneal soft tissue nodule adjacent to the splenic flexure and smooth left pelvic sidewall peritoneal thickening, cannot exclude residual/recurrent disease. Attention on follow-up CT advised. 3. Bilateral renal collecting system dilatation has improved. 4.  Aortic Atherosclerosis (ICD10-I70.0).   07/02/2018 Tumor Marker   Patient's tumor was tested for the following markers: CA-125 Results of the tumor marker test revealed 42.7   07/11/2018 Echocardiogram   1. The left ventricle has normal systolic function with an ejection fraction of 60-65%. The cavity size was normal. Left ventricular diastolic parameters were normal.  2. Normal GLS -20.1.  3. The right ventricle has normal systolic function. The cavity was normal. There is no increase in right ventricular wall thickness.  4. The mitral valve is degenerative. Moderate thickening of the mitral valve leaflet. Moderate calcification of the mitral valve leaflet.  5. The aortic valve is tricuspid. Moderate thickening of the aortic valve. Moderate calcification of the aortic valve. Aortic valve regurgitation is trivial by color flow Doppler.  6. The aortic root is normal in size and structure.   07/16/2018 - 12/03/2018 Chemotherapy   The patient had  doxorubicin and bevacizumab for chemotherapy treatment.     07/30/2018 Tumor Marker   Patient's tumor was tested for the following markers: CA-125 Results of the tumor marker test revealed 82.6   08/27/2018 Tumor Marker   Patient's tumor was tested for the following markers: CA-125 Results of the tumor marker test revealed 111.   09/24/2018 Tumor Marker   Patient's tumor was tested for the following markers: CA-125 Results of the tumor marker test revealed 142.   10/05/2018 Imaging   1. Stable right juxta diaphragmatic/pericardiophrenic lymph node, mildly enlarged. 2. Otherwise no definite metastatic disease identified in the abdomen/pelvis. 3. 3 mm peritoneal nodule noted left paracolic gutter on today's study, indeterminate. Attention on follow-up recommended. 4.  Aortic Atherosclerois (ICD10-170.0)   10/16/2018 Echocardiogram    1. No significant change from prior study (07/11/2018).  2. Left ventricular ejection fraction, by visual estimation, is 60 to 65%. The left ventricle has normal function. Normal left ventricular size. There is no left ventricular hypertrophy.  3. LVEF by 3D assessment 63%.  4. The average left ventricular global longitudinal strain is -22.4 %.  5. Global right ventricle has normal systolic function.The right ventricular size is normal. No increase in right ventricular wall thickness.  6. Left atrial size was normal.  7. Right atrial size was normal.  8. Presence of pericardial fat pad.  9. Moderate thickening of the mitral valve leaflet(s). 10. The mitral valve is normal in structure. Trace mitral valve regurgitation. 11. The tricuspid valve is normal in structure. Tricuspid valve regurgitation is mild. 12. Aortic regurgitation PHT measures 467 msec. 13. The aortic valve is tricuspid Aortic valve regurgitation is mild by color flow Doppler. Mild aortic valve sclerosis without stenosis. 14. There is mild to moderate calcification of the AoV, with focal  calcification of the Andrew. The aortic regurgitation is mild in severity, likely related to degenerative valve disease. 15. The pulmonic valve was grossly  normal. Pulmonic valve regurgitation is not visualized by color flow Doppler. 16. Mild plaque invoving the ascending aorta. 17. Normal pulmonary artery systolic pressure. 18. The tricuspid regurgitant velocity is 2.52 m/s, and with an assumed right atrial pressure of 3 mmHg, the estimated right ventricular systolic pressure is normal at 28.4 mmHg.   10/26/2018 Tumor Marker   Patient's tumor was tested for the following markers: CA-125 Results of the tumor marker test revealed 196   12/11/2018 Imaging   1. No substantial interval change in exam. No definite findings to suggest recurrent/metastatic disease. 2. Right pericardial phrenic lymph node noted on multiple prior studies is unchanged in the interval. 3. 8 mm omental nodule identified on today's exam. Close attention on follow-up recommended. 3 mm soft tissue nodule along the left paracolic gutter seen previously has resolved in the interval. 4.  Aortic Atherosclerois (ICD10-170.0)   04/02/2019 Imaging   1. Interval development of left axillary and subpectoral lymphadenopathy is highly concerning for metastatic disease, however, this would be a highly unusual pattern of metastatic spread for an ovarian primary neoplasm. This is most concerning for potential left-sided breast cancer. Correlation with mammography is strongly recommended. 2. No other signs of definite metastatic disease noted elsewhere in the chest, abdomen or pelvis. 3. Aortic atherosclerosis. 4. Additional incidental findings, similar to prior studies, as above.     04/03/2019 Pathology Results   Immunohistochemistry shows the carcinoma is positive with cytokeratin AE1/AE3, cytokeratin 7, PAX-8, WT-1, and shows patchy positivity with estrogen receptor. The carcinoma is negative with GATA3, GCDFP, p53, Napsin A and TTF-1. The  immunophenotype is most consistent with metastatic high grade serous carcinoma. (JDP:ah 04/04/19)FINAL DIAGNOSIS Diagnosis Lymph node, needle/core biopsy, left axilla - METASTATIC CARCINOMA. - SEE MICROSCOPIC DESCRIPT    Genetic Testing   Patient has genetic testing done for PD-L1. Results revealed patient has the following: PD-L1 staining in tumor cells (TC): 3% PD-L1 staining in tumor-associated immune cells (IC): 10% PD-L1 combined positive score (CPS): 5%   04/15/2019 - 06/13/2019 Chemotherapy   The patient had gemzar for chemotherapy treatment.     06/21/2019 Imaging   1. Bulky LEFT axillary adenopathy greater than RIGHT axillary adenopathy increasing in size. 2. Developing nodularity and or small lymph nodes along the LEFT hemidiaphragm in the inferior pre pericardial fat. Attention on follow-up. 3. Stable small lymph nodes throughout the retroperitoneum. 4. Stable low-density hepatic lesions compatible with cysts 5. Aortic atherosclerosis.   Aortic Atherosclerosis (ICD10-I70.0).     10/03/2019 Genetic Testing   Foundation One Results:     11/18/2019 -  Chemotherapy   The patient had pembrolizumab (KEYTRUDA) 200 mg in sodium chloride 0.9 % 50 mL chemo infusion, 200 mg, Intravenous, Once, 0 of 6 cycles  for chemotherapy treatment.      CANCER STAGING: Cancer Staging Right ovarian epithelial cancer ([place]) Staging form: Ovary, Fallopian Tube, and Primary Peritoneal Carcinoma, AJCC 8th Edition - Clinical: cT3, cN0, cM0 - Signed by Heath Lark, MD on 02/08/2018   INTERVAL HISTORY:  [name], a 74 y.o. female, returns for routine follow-up and consideration for first cycle of immunotherapy. Franki was last seen on 10/17/2019.  Due for initiating cycle #1 of Keytruda today.   Today she is accompanied by her husband. Overall, she tells me she has been feeling okay. She complains of swelling in her chest and abdomen with her clothes feeling tighter. She reports having  intermittent pain in her left axilla and she takes Dilaudid PRN. She is reporting  urinary frequency throughout the day and night. Her appetite is good and she is drinking Boost occasionally, especially when she misses a meal. She is staying active and doing all of her chores and ADL's. She is taking levothyroxine 75 mcg with 1/2 tablet on Tuesday and Thursday.  Overall, she feels ready for first cycle of immunotherapy today.    REVIEW OF SYSTEMS:  Review of Systems  Constitutional: Negative for appetite change and fatigue.  Respiratory: Positive for chest tightness (chest swelling).   Gastrointestinal: Positive for abdominal distention.  Genitourinary: Positive for frequency.   Musculoskeletal: Positive for myalgias (4/10 L axilla pain).  Neurological: Positive for numbness (R hand).  Hematological: Positive for adenopathy (bilat armpits).  All other systems reviewed and are negative.   PAST MEDICAL/SURGICAL HISTORY:  Past Medical History:  Diagnosis Date  . Allergy    SEASONAL  . Anxiety   . Cataract    BILATERAL  . Dysrhythmia   . Family history of lung cancer   . Family history of prostate cancer   . Family history of prostate cancer   . Family history of uterine cancer   . Fibromyalgia   . GERD (gastroesophageal reflux disease)   . H/O echocardiogram 04/28/08   EF>55% trace mitral regurgitation, No significant valvular pathology  . Hemorrhoids    internal  . History of stress test 02/08/2011   Normal Myocardial perfusion study, this is a low risk scan, No prior study available for comparison  . Hyperlipidemia   . Hypertension    hx of  . Hypothyroidism   . MVP (mitral valve prolapse)    mild and MR  . OA (osteoarthritis)   . Osteoporosis   . ovarian ca dx'd 12/2017   ovarian cancer  . Paroxysmal A-fib ([place])   . Sleep apnea    does not use prescribed CPAP  does not tolerate  . Thyroid disease    HYPO  . Varicosities of leg   . Vitamin D deficiency    Past  Surgical History:  Procedure Laterality Date  . ABDOMINAL HYSTERECTOMY  1989  . APPENDECTOMY  1989   with hysterectomy  . BILATERAL SALPINGECTOMY N/A 03/27/2018   Procedure: EXPLORATORY LAPARATOMY, BILATERAL SALPINGOOPHERECTOMY;  Surgeon: Everitt Amber, MD;  Location: [facility];  Service: Gynecology;  Laterality: N/A;  . BREAST EXCISIONAL BIOPSY Left 1995   Benign  . COLONOSCOPY  11-20-2000  . DEBULKING N/A 03/27/2018   Procedure: RADICAL TUMOR DEBULKING;  Surgeon: Everitt Amber, MD;  Location: [facility];  Service: Gynecology;  Laterality: N/A;  . IR IMAGING GUIDED PORT INSERTION  01/17/2018  . NOSE SURGERY  1990  . OMENTECTOMY N/A 03/27/2018   Procedure: OMENTECTOMY;  Surgeon: Everitt Amber, MD;  Location: [facility];  Service: Gynecology;  Laterality: N/A;    SOCIAL HISTORY:  Social History   Socioeconomic History  . Marital status: Married    Spouse name: Not on file  . Number of children: 3  . Years of education: Not on file  . Highest education level: Not on file  Occupational History  . Occupation: retired  Tobacco Use  . Smoking status: Light Tobacco Smoker    Years: 10.00    Types: Cigars  . Smokeless tobacco: Never Used  . Tobacco comment: not daily  [place] cigars 1-2   Vaping Use  . Vaping Use: Never used  Substance and Sexual Activity  . Alcohol use: No    Alcohol/week: 0.0 standard drinks  . Drug use: No  . Sexual  activity: Not Currently    Comment: 1st intercourse 17yo-1 partner  Other Topics Concern  . Not on file  Social History Narrative  . Not on file   Social Determinants of Health   Financial Resource Strain: Low Risk   . Difficulty of Paying Living Expenses: Not hard at all  Food Insecurity: No Food Insecurity  . Worried About Charity fundraiser in the Last Year: Never true  . Ran Out of Food in the Last Year: Never true  Transportation Needs: No Transportation Needs  . Lack of Transportation (Medical): No  . Lack of Transportation (Non-Medical): No    Physical Activity: Inactive  . Days of Exercise per Week: 0 days  . Minutes of Exercise per Session: 0 min  Stress: No Stress Concern Present  . Feeling of Stress : Not at all  Social Connections: Moderately Integrated  . Frequency of Communication with Friends and Family: More than three times a week  . Frequency of Social Gatherings with Friends and Family: Three times a week  . Attends Religious Services: More than 4 times per year  . Active Member of Clubs or Organizations: No  . Attends Archivist Meetings: Never  . Marital Status: Married  Human resources officer Violence: Not At Risk  . Fear of Current or Ex-Partner: No  . Emotionally Abused: No  . Physically Abused: No  . Sexually Abused: No    FAMILY HISTORY:  Family History  Problem Relation Age of Onset  . Diabetes Daughter   . Psoriasis Daughter   . Heart defect Daughter   . Diabetes Mother   . Hypertension Mother   . Heart disease Mother   . Stroke Maternal Grandmother   . Heart attack Brother   . Heart attack Brother   . Prostatitis Brother   . Lung cancer Niece   . Cancer Paternal Aunt        type of cancer unk  . Prostate cancer Paternal Uncle   . Uterine cancer Cousin        dx under 68  . Colon cancer Neg Hx     CURRENT MEDICATIONS:  Current Outpatient Medications  Medication Sig Dispense Refill  . ALPRAZolam (XANAX) 0.5 MG tablet TAKE 1/2 TO 1 (ONE-HALF TO ONE) TABLET BY MOUTH ONCE DAILY AS NEEDED FOR ANXIETY 30 tablet 2  . alum & mag hydroxide-simeth (MAALOX/MYLANTA) 200-200-20 MG/5ML suspension Take 30 mLs by mouth every 6 (six) hours as needed for indigestion or heartburn. 355 mL 0  . Artificial Saliva (SALIVASURE) LOZG Use as directed 1 lozenge in the mouth or throat every 4 (four) hours as needed. 90 lozenge 0  . Calcium Carbonate (CALTRATE 600) 1500 MG TABS Take 600 mg of elemental calcium by mouth daily.     . cholecalciferol (VITAMIN D) 1000 UNITS tablet Take 1,000 Units by mouth daily.      Marland Kitchen estradiol (ESTRACE) 0.1 MG/GM vaginal cream Place 1 Applicatorful vaginally 3 (three) times a week. 42.5 g 12  . fluticasone (CUTIVATE) 0.05 % cream APPLY CREAM TOPICALLY UP TO TWICE DAILY AS NEEDED FOR RASH    . fluticasone (FLONASE) 50 MCG/ACT nasal spray Place 2 sprays into both nostrils daily. 48 g 2  . gabapentin (NEURONTIN) 300 MG capsule Take 1 capsule (300 mg total) by mouth 2 (two) times daily. 60 capsule 3  . HYDROmorphone (DILAUDID) 2 MG tablet Take 1 tablet (2 mg total) by mouth daily as needed for severe pain. 30 tablet 0  .  lactose free nutrition (BOOST PLUS) LIQD Take 237 mLs by mouth daily. (Patient taking differently: Take 237 mLs by mouth 2 (two) times daily between meals. ) 10 Can 0  . levothyroxine (SYNTHROID) 75 MCG tablet Take 1 tablet by mouth every day 90 tablet 1  . losartan (COZAAR) 25 MG tablet Take 1 tablet (25 mg total) by mouth daily. 30 tablet 2  . Magnesium 400 MG TABS Take 250 mg by mouth daily.    . Menthol-Methyl Salicylate (MUSCLE RUB) 10-15 % CREA Apply 1 application topically as needed for muscle pain.    . metoprolol succinate (TOPROL-XL) 50 MG 24 hr tablet Take 1 tablet (50 mg total) by mouth daily. Take with or immediately following a meal. 90 tablet 1  . Multiple Vitamins-Minerals (MULTIVITAMIN WITH MINERALS) tablet Take 1 tablet by mouth daily.      . Omega-3 Fatty Acids (FISH OIL) 1000 MG CAPS Take 2,000 mg by mouth 2 (two) times daily.     Marland Kitchen omeprazole (PRILOSEC) 40 MG capsule Take 1 capsule (40 mg total) by mouth daily. 90 capsule 3  . Pembrolizumab (KEYTRUDA IV) Inject 200 mg into the vein every 21 ( twenty-one) days.    . polyethylene glycol (MIRALAX / GLYCOLAX) packet Take 17 g by mouth daily. 14 each 0  . senna (SENOKOT) 8.6 MG TABS tablet Take 1 tablet (8.6 mg total) by mouth at bedtime as needed for mild constipation. 120 each 0  . simvastatin (ZOCOR) 20 MG tablet Take 1 tablet (20 mg total) by mouth at bedtime. 90 tablet 1  . tretinoin  (RETIN-A) 0.025 % cream APPLY TOPICALLY TO FACE AT BEDTIME AS NEEDED    . vitamin B-12 (CYANOCOBALAMIN) 1000 MCG tablet Take 1,000 mcg by mouth daily.    . vitamin C (ASCORBIC ACID) 500 MG tablet Take 1,000 mg by mouth daily.    . vitamin E 45 MG (100 UNITS) capsule Take by mouth daily.    [name] 20 MG TABS tablet TAKE 1 TABLET BY MOUTH ONCE DAILY WITH SUPPER 90 tablet 2  . lidocaine-prilocaine (EMLA) cream Apply to affected area once (Patient not taking: Reported on 11/18/2019) 30 g 3   No current facility-administered medications for this visit.    ALLERGIES:  Allergies  Allergen Reactions  . Tramadol Hcl     jittery    PHYSICAL EXAM:  Performance status (ECOG): 1 - Symptomatic but completely ambulatory  Vitals:   11/18/19 0858  BP: 137/68  Pulse: (!) 50  Resp: 18  Temp: (!) 97 F (36.1 C)  SpO2: 100%   Wt Readings from Last 3 Encounters:  11/18/19 155 lb 9.6 oz (70.6 kg)  10/17/19 151 lb 9.6 oz (68.8 kg)  09/26/19 146 lb (66.2 kg)   Physical Exam Vitals reviewed.  Constitutional:      Appearance: Normal appearance.  Cardiovascular:     Rate and Rhythm: Normal rate and regular rhythm.     Pulses: Normal pulses.     Heart sounds: Normal heart sounds.  Pulmonary:     Effort: Pulmonary effort is normal.     Breath sounds: Normal breath sounds.  Chest:     Comments: Port-a-Cath in R chest Abdominal:     General: There is no distension.     Palpations: Abdomen is soft. There is no mass.     Tenderness: There is no abdominal tenderness.  Musculoskeletal:     Right lower leg: No edema.     Left lower leg: No edema.  Lymphadenopathy:     Upper Body:     Right upper body: Axillary adenopathy present.     Left upper body: Axillary adenopathy present.  Neurological:     General: No focal deficit present.     Mental Status: She is alert and oriented to person, place, and time.  Psychiatric:        Mood and Affect: Mood normal.        Behavior: Behavior normal.       LABORATORY DATA:  I have reviewed the labs as listed.  CBC Latest Ref Rng & Units 11/18/2019 09/09/2019 06/13/2019  WBC 4.0 - 10.5 K/uL 6.2 7.4 6.5  Hemoglobin 12.0 - 15.0 g/dL 11.0(L) 12.8 10.5(L)  Hematocrit 36 - 46 % 35.0(L) 41.5 32.1(L)  Platelets 150 - 400 K/uL 176 175 89(L)   CMP Latest Ref Rng & Units 11/18/2019 11/13/2019 09/09/2019  Glucose 70 - 99 mg/dL 92 92 98  BUN 8 - 23 mg/dL _0 Creatinine 0.44 - 1.00 mg/dL 0.79 0.92 0.71  Sodium 135 - 145 mmol/L 136 139 137  Potassium 3.5 - 5.1 mmol/L 4.1 4.8 4.3  Chloride 98 - 111 mmol/L 104 102 105  CO2 22 - 32 mmol/L _1 Calcium 8.9 - 10.3 mg/dL 9.1 9.4 8.6(L)  Total Protein 6.5 - 8.1 g/dL 6.8 7.6 7.5  Total Bilirubin 0.3 - 1.2 mg/dL 0.5 0.4 0.4  Alkaline Phos 38 - 126 U/L 64 104 71  AST 15 - 41 U/L 22 22 14(L)  ALT 0 - 44 U/L _2 DIAGNOSTIC IMAGING:  I have independently reviewed the scans and discussed with the patient. No results found.   ASSESSMENT:  1. Platinum resistant high-grade serous ovarian carcinoma: -Neoadjuvant chemotherapy with 3 cycles of carboplatin and paclitaxel from 01/18/2018 through 03/02/2018 presentation as stage IIIc -03/27/2018-BSO,omentectomy with pathology showing high-grade serous carcinoma, PT3CP NX followed by 3 more cycles of carboplatin and paclitaxel from 04/20/2018 through 05/31/2018. -5 cycles of Doxil and Avastin from 07/16/2018 through 11/14/2018 -Left axillary lymph node biopsy on 04/03/2019 consistent with high-grade serous carcinoma. -Third line therapy with gemcitabine 3 cycles from 04/15/2019 through 06/03/2019. -CT scan of the AP on 06/21/2019 shows bulky left axillary adenopathy greater than the right axillary adenopathy increasing in size, developing nodularity and/or small lymph nodes along the left hemidiaphragm in the inferior prepericardial fat. Stable small lymph nodes throughout the retroperitoneum. -She was seen by Drs. Alvy Bimler and Dr. Denman George and was given palliative  options including chemotherapy with topotecan, docetaxel and pembrolizumab. -PD-L1 CPS score is 6%. -Germline and somatic mutation testing negative on 04/11/2018. -Foundation 1 testing did not show targetable mutations. -CT CAP from 10/03/2019 shows bulky axillary adenopathy enlarged on the right side and stable on the left axilla.  Increasing size of the mediastinal lymph nodes.  Soft tissue implant along the ascending colon.  Enlarging lymph nodes and increasing number of lymph nodes in the upper abdomen. -CA-125 is worsening at 768. -Keytruda for cycle started on 11/18/2019.  2. Social/family history: -Patient smokes half pack per day of cigarettes. -Family history significant for maternal niece with breast cancer.   PLAN:  1. Platinum resistant high-grade serous ovarian carcinoma: -She reports occasional pain in the axillary lymph nodes. -She reported weight gain of 9 pounds in the last 2 months, unintentional. -She reported her shirts getting tighter mainly in the chest area. -Physical exam did not reveal any ascites. -Labs reviewed show albumin 3.5 and other LFTs  are normal. -We will check TSH. -We talked about starting her on Keytruda with approximate response rates of 10%. -RTC 3 weeks for labs and treatment.   Orders placed this encounter:  Orders Placed This Encounter  Procedures  . [place], MD [facility] [phone]   I, Milinda Antis, am acting as a scribe for Dr. Sanda Linger.  I, Derek Jack MD, have reviewed the above documentation for accuracy and completeness, and I agree with the above.

## 2019-11-18 NOTE — Progress Notes (Signed)
[facility] presents today for [employer]. Pt denies any new changes or symptoms since last visit. Lab results and vitals have been reviewed and are stable and within parameters for treatment. Patient has been assessed by Dr. Delton Coombes who has approved proceeding with treatment today as planned.  Infusions tolerated without incident or complaint. VSS upon completion of treatment. Port flushed and deaccessed per protocol, see MAR and IV flowsheet for details. Discharged in satisfactory condition with follow up instructions.

## 2019-11-18 NOTE — Patient Instructions (Signed)
[place] at [facility] Discharge Instructions  You were seen today by Dr. Delton Coombes. He went over your recent results. You received your first treatment of Keytruda today. Some side effects to expect are dry skin and watery diarrhea; if you develop watery diarrhea more than 5 episodes per day, call the office immediately. Dr. Delton Coombes will see you back in 3 weeks for labs and follow up.   Thank you for choosing [place] at [facility] to provide your oncology and hematology care.  To afford each patient quality time with our provider, please arrive at least 15 minutes before your scheduled appointment time.   If you have a lab appointment with the [place] please come in thru the Main Entrance and check in at the main information desk  You need to re-schedule your appointment should you arrive 10 or more minutes late.  We strive to give you quality time with our providers, and arriving late affects you and other patients whose appointments are after yours.  Also, if you no show three or more times for appointments you may be dismissed from the clinic at the providers discretion.     Again, thank you for choosing [facility].  Our hope is that these requests will decrease the amount of time that you wait before being seen by our physicians.       _____________________________________________________________  Should you have questions after your visit to [facility], please contact our office at [phone] between the hours of 8:00 a.m. and 4:30 p.m.  Voicemails left after 4:00 p.m. will not be returned until the following business day.  For prescription refill requests, have your pharmacy contact our office and allow 72 hours.    Cancer Center Support Programs:   > Cancer Support Group  2nd Tuesday of the month 1pm-2pm, Journey Room

## 2019-11-18 NOTE — Progress Notes (Signed)
Patient was assessed by Dr. Katragadda and labs have been reviewed.  Patient is okay to proceed with treatment today. Primary RN and pharmacy aware.   

## 2019-11-18 NOTE — Patient Instructions (Signed)
[facility] at [facility] Discharge Instructions  Labs drawn from portacath today   Thank you for choosing [facility] at [facility] to provide your oncology and hematology care.  To afford each patient quality time with our provider, please arrive at least 15 minutes before your scheduled appointment time.   If you have a lab appointment with the Cancer Center please come in thru the Main Entrance and check in at the main information desk.  You need to re-schedule your appointment should you arrive 10 or more minutes late.  We strive to give you quality time with our providers, and arriving late affects you and other patients whose appointments are after yours.  Also, if you no show three or more times for appointments you may be dismissed from the clinic at the providers discretion.     Again, thank you for choosing [facility].  Our hope is that these requests will decrease the amount of time that you wait before being seen by our physicians.       _____________________________________________________________  Should you have questions after your visit to [facility], please contact our office at [phone] and follow the prompts.  Our office hours are 8:00 a.m. and 4:30 p.m. Monday - Friday.  Please note that voicemails left after 4:00 p.m. may not be returned until the following business day.  We are closed weekends and major holidays.  You do have access to a nurse 24-7, just call the main number to the clinic [phone] and do not press any options, hold on the line and a nurse will answer the phone.    For prescription refill requests, have your pharmacy contact our office and allow 72 hours.    Due to Covid, you will need to wear a mask upon entering the hospital. If you do not have a mask, a mask will be given to you at the Main Entrance upon arrival. For doctor visits, patients may have 1 support person age 18  or older with them. For treatment visits, patients can not have anyone with them due to social distancing guidelines and our immunocompromised population.     

## 2019-11-18 NOTE — Patient Instructions (Signed)
[facility] Discharge Instructions for Patients Receiving Chemotherapy   Beginning January 23rd 2017 lab work for the Cancer Center will be done in the  Main lab at  on 1st floor. If you have a lab appointment with the Cancer Center please come in thru the  Main Entrance and check in at the main information desk   Today you received the following chemotherapy agents Keytruda  To help prevent nausea and vomiting after your treatment, we encourage you to take your nausea medication   If you develop nausea and vomiting, or diarrhea that is not controlled by your medication, call the clinic.  The clinic phone number is [phone]. Office hours are Monday-Friday 8:30am-5:00pm.  BELOW ARE SYMPTOMS THAT SHOULD BE REPORTED IMMEDIATELY:  *FEVER GREATER THAN 101.0 F  *CHILLS WITH OR WITHOUT FEVER  NAUSEA AND VOMITING THAT IS NOT CONTROLLED WITH YOUR NAUSEA MEDICATION  *UNUSUAL SHORTNESS OF BREATH  *UNUSUAL BRUISING OR BLEEDING  TENDERNESS IN MOUTH AND THROAT WITH OR WITHOUT PRESENCE OF ULCERS  *URINARY PROBLEMS  *BOWEL PROBLEMS  UNUSUAL RASH Items with * indicate a potential emergency and should be followed up as soon as possible. If you have an emergency after office hours please contact your primary care physician or go to the nearest emergency department.  Please call the clinic during office hours if you have any questions or concerns.   You may also contact the Patient Navigator at [phone] should you have any questions or need assistance in obtaining follow up care.      Resources For Cancer Patients and their Caregivers ? American Cancer Society: Can assist with transportation, wigs, general needs, runs Look Good Feel Better.        1-888-227-6333 ? Cancer Care: Provides financial assistance, online support groups, medication/co-pay assistance.  1-800-813-HOPE (4673) ? [facility] Assists [place] cancer  patients and their families through emotional , educational and financial support.  336-427-4357 ? [place] DSS Where to apply for food stamps, Medicaid and utility assistance. 336-342-1394 ? [facility]: Transportation to medical appointments. 336-347-2287 ? Social Security Administration: May apply for disability if have a Stage IV cancer. 336-342-7796 1-800-772-1213 ? [place] Aging, Disability and Transit Services: Assists with nutrition, care and transit needs. 336-349-2343          

## 2019-11-19 ENCOUNTER — Telehealth (HOSPITAL_COMMUNITY): Payer: Self-pay | Admitting: *Deleted

## 2019-11-19 LAB — T4: T4, Total: 8 ug/dL (ref 4.5–12.0)

## 2019-11-19 NOTE — Telephone Encounter (Signed)
24 hour post chemotherapy follow-up call made to [facility]. She was not at home but I spoke with her husband, Daphene Calamity") who states that she is doing well. He said that she was currently out and about running errands. When asked if there was anything we could do for Mrs. Minihan or him or if he had any questions I could answer, he stated there was not. He gave appreciation for the call and states he will let her know. I emphasized to him to please call us at the clinic for any questions or concerns, to which he agreed he would.

## 2019-11-20 LAB — CA 125: Cancer Antigen (CA) 125: 1567 U/mL — ABNORMAL HIGH (ref 0.0–38.1)

## 2019-11-26 ENCOUNTER — Ambulatory Visit (INDEPENDENT_AMBULATORY_CARE_PROVIDER_SITE_OTHER): Payer: PPO

## 2019-11-26 ENCOUNTER — Other Ambulatory Visit: Payer: Self-pay

## 2019-11-26 VITALS — BP 138/76 | HR 48 | Temp 98.4°F | Resp 20 | Wt 152.0 lb

## 2019-11-26 DIAGNOSIS — Z0000 Encounter for general adult medical examination without abnormal findings: Secondary | ICD-10-CM | POA: Diagnosis not present

## 2019-11-26 NOTE — Progress Notes (Signed)
Subjective:   Haley [name] is a 74 y.o. female who presents for Medicare Annual (Subsequent) preventive examination.  Review of Systems     Cardiac Risk Factors include: advanced age (>79men, >23 women);dyslipidemia     Objective:    Today's Vitals   11/26/19 1107  BP: 138/76  Pulse: (!) 48  Resp: 20  Temp: 98.4 F (36.9 C)  SpO2: 98%  Weight: 152 lb (68.9 kg)   Body mass index is 27.8 kg/m.  Advanced Directives 11/26/2019 11/18/2019 10/17/2019 09/09/2019 06/13/2019 04/11/2018 03/27/2018  Does Patient Have a Medical Advance Directive? Yes Yes Yes Yes Yes Yes Yes  Type of Paramedic of [place];Living will [place];Living will Living will;Healthcare Power of [place];Living will [place];Living will [place];Living will  Does patient want to make changes to medical advance directive? - No - Patient declined No - Patient declined - No - Patient declined - No - Patient declined  Copy of [place] in Chart? Yes - validated most recent copy scanned in chart (See row information) No - copy requested No - copy requested Yes - validated most recent copy scanned in chart (See row information) Yes - validated most recent copy scanned in chart (See row information) Yes - validated most recent copy scanned in chart (See row information) No - copy requested  Would patient like information on creating a medical advance directive? - No - Patient declined No - Patient declined - - - -    Current Medications (verified) Outpatient Encounter Medications as of 11/26/2019  Medication Sig  . ALPRAZolam (XANAX) 0.5 MG tablet TAKE 1/2 TO 1 (ONE-HALF TO ONE) TABLET BY MOUTH ONCE DAILY AS NEEDED FOR ANXIETY  . alum & mag hydroxide-simeth (MAALOX/MYLANTA) 200-200-20 MG/5ML suspension Take 30 mLs by mouth every 6 (six) hours as needed for indigestion  or heartburn.  . Artificial Saliva (SALIVASURE) LOZG Use as directed 1 lozenge in the mouth or throat every 4 (four) hours as needed.  . Calcium Carbonate (CALTRATE 600) 1500 MG TABS Take 600 mg of elemental calcium by mouth daily.   . cholecalciferol (VITAMIN D) 1000 UNITS tablet Take 1,000 Units by mouth daily.   Marland Kitchen estradiol (ESTRACE) 0.1 MG/GM vaginal cream Place 1 Applicatorful vaginally 3 (three) times a week.  . fluticasone (CUTIVATE) 0.05 % cream APPLY CREAM TOPICALLY UP TO TWICE DAILY AS NEEDED FOR RASH  . fluticasone (FLONASE) 50 MCG/ACT nasal spray Place 2 sprays into both nostrils daily.  Marland Kitchen gabapentin (NEURONTIN) 300 MG capsule Take 1 capsule (300 mg total) by mouth 2 (two) times daily.  Marland Kitchen HYDROmorphone (DILAUDID) 2 MG tablet Take 1 tablet (2 mg total) by mouth daily as needed for severe pain.  Marland Kitchen lactose free nutrition (BOOST PLUS) LIQD Take 237 mLs by mouth daily.  Marland Kitchen levothyroxine (SYNTHROID) 75 MCG tablet Take 1 tablet by mouth every day  . losartan (COZAAR) 25 MG tablet Take 1 tablet (25 mg total) by mouth daily.  . Magnesium 400 MG TABS Take 250 mg by mouth daily.  . Menthol-Methyl Salicylate (MUSCLE RUB) 10-15 % CREA Apply 1 application topically as needed for muscle pain.  . metoprolol succinate (TOPROL-XL) 50 MG 24 hr tablet Take 1 tablet (50 mg total) by mouth daily. Take with or immediately following a meal.  . Multiple Vitamins-Minerals (MULTIVITAMIN WITH MINERALS) tablet Take 1 tablet by mouth daily.    . Omega-3  Fatty Acids (FISH OIL) 1000 MG CAPS Take 2,000 mg by mouth 2 (two) times daily.   Marland Kitchen omeprazole (PRILOSEC) 40 MG capsule Take 1 capsule (40 mg total) by mouth daily.  . Pembrolizumab (KEYTRUDA IV) Inject 200 mg into the vein every 21 ( twenty-one) days.  . polyethylene glycol (MIRALAX / GLYCOLAX) packet Take 17 g by mouth daily.  Marland Kitchen senna (SENOKOT) 8.6 MG TABS tablet Take 1 tablet (8.6 mg total) by mouth at bedtime as needed for mild constipation.  . simvastatin  (ZOCOR) 20 MG tablet Take 1 tablet (20 mg total) by mouth at bedtime.  . tretinoin (RETIN-A) 0.025 % cream APPLY TOPICALLY TO FACE AT BEDTIME AS NEEDED  . vitamin B-12 (CYANOCOBALAMIN) 1000 MCG tablet Take 1,000 mcg by mouth daily.  . vitamin C (ASCORBIC ACID) 500 MG tablet Take 1,000 mg by mouth daily.  . vitamin E 45 MG (100 UNITS) capsule Take by mouth daily.  [name] 20 MG TABS tablet TAKE 1 TABLET BY MOUTH ONCE DAILY WITH SUPPER  . zinc gluconate 50 MG tablet Take 50 mg by mouth daily.  Marland Kitchen lidocaine-prilocaine (EMLA) cream Apply to affected area once (Patient not taking: Reported on 11/18/2019)   No facility-administered encounter medications on file as of 11/26/2019.    Allergies (verified) Tramadol hcl   History: Past Medical History:  Diagnosis Date  . Allergy    SEASONAL  . Anxiety   . Cataract    BILATERAL  . Dysrhythmia   . Family history of lung cancer   . Family history of prostate cancer   . Family history of prostate cancer   . Family history of uterine cancer   . Fibromyalgia   . GERD (gastroesophageal reflux disease)   . H/O echocardiogram 04/28/08   EF>55% trace mitral regurgitation, No significant valvular pathology  . Hemorrhoids    internal  . History of stress test 02/08/2011   Normal Myocardial perfusion study, this is a low risk scan, No prior study available for comparison  . Hyperlipidemia   . Hypertension    hx of  . Hypothyroidism   . MVP (mitral valve prolapse)    mild and MR  . OA (osteoarthritis)   . Osteoporosis   . ovarian ca dx'd 12/2017   ovarian cancer  . Paroxysmal A-fib ([place])   . Sleep apnea    does not use prescribed CPAP  does not tolerate  . Thyroid disease    HYPO  . Varicosities of leg   . Vitamin D deficiency    Past Surgical History:  Procedure Laterality Date  . ABDOMINAL HYSTERECTOMY  1989  . APPENDECTOMY  1989   with hysterectomy  . BILATERAL SALPINGECTOMY N/A 03/27/2018   Procedure: EXPLORATORY LAPARATOMY,  BILATERAL SALPINGOOPHERECTOMY;  Surgeon: Everitt Amber, MD;  Location: [facility];  Service: Gynecology;  Laterality: N/A;  . BREAST EXCISIONAL BIOPSY Left 1995   Benign  . COLONOSCOPY  11-20-2000  . DEBULKING N/A 03/27/2018   Procedure: RADICAL TUMOR DEBULKING;  Surgeon: Everitt Amber, MD;  Location: [facility];  Service: Gynecology;  Laterality: N/A;  . IR IMAGING GUIDED PORT INSERTION  01/17/2018  . NOSE SURGERY  1990  . OMENTECTOMY N/A 03/27/2018   Procedure: OMENTECTOMY;  Surgeon: Everitt Amber, MD;  Location: [facility];  Service: Gynecology;  Laterality: N/A;   Family History  Problem Relation Age of Onset  . Diabetes Daughter   . Psoriasis Daughter   . Heart defect Daughter   . Diabetes Mother   .  Hypertension Mother   . Heart disease Mother   . Stroke Maternal Grandmother   . Heart attack Brother   . Heart attack Brother   . Prostatitis Brother   . Lung cancer Niece   . Cancer Paternal Aunt        type of cancer unk  . Prostate cancer Paternal Uncle   . Uterine cancer Cousin        dx under 16  . Colon cancer Neg Hx    Social History   Socioeconomic History  . Marital status: Married    Spouse name: Not on file  . Number of children: 3  . Years of education: Not on file  . Highest education level: Not on file  Occupational History  . Occupation: retired  Tobacco Use  . Smoking status: Light Tobacco Smoker    Years: 10.00    Types: Cigars  . Smokeless tobacco: Never Used  . Tobacco comment: not daily  [place] cigars 1-2   Vaping Use  . Vaping Use: Never used  Substance and Sexual Activity  . Alcohol use: No    Alcohol/week: 0.0 standard drinks  . Drug use: No  . Sexual activity: Not Currently    Comment: 1st intercourse 17yo-1 partner  Other Topics Concern  . Not on file  Social History Narrative  . Not on file   Social Determinants of Health   Financial Resource Strain: Low Risk   . Difficulty of Paying Living Expenses: Not hard at all  Food Insecurity: No Food  Insecurity  . Worried About Charity fundraiser in the Last Year: Never true  . Ran Out of Food in the Last Year: Never true  Transportation Needs: No Transportation Needs  . Lack of Transportation (Medical): No  . Lack of Transportation (Non-Medical): No  Physical Activity: Inactive  . Days of Exercise per Week: 0 days  . Minutes of Exercise per Session: 0 min  Stress: Stress Concern Present  . Feeling of Stress : To some extent  Social Connections: Moderately Isolated  . Frequency of Communication with Friends and Family: More than three times a week  . Frequency of Social Gatherings with Friends and Family: Three times a week  . Attends Religious Services: Never  . Active Member of Clubs or Organizations: No  . Attends Archivist Meetings: Never  . Marital Status: Married    Tobacco Counseling Ready to quit: Not Answered Counseling given: Not Answered Comment: not daily  Cheyenne cigars 1-2    Clinical Intake:  Pre-visit preparation completed: Yes  Pain : No/denies pain     BMI - recorded: 27.8 Nutritional Status: BMI 25 -29 Overweight Nutritional Risks: Non-healing wound Diabetes: No  How often do you need to have someone help you when you read instructions, pamphlets, or other written materials from your doctor or pharmacy?: 1 - Never  Diabetic?No  Interpreter Needed?: No  Information entered by :: Charlott Rakes, LPN   Activities of Daily Living In your present state of health, do you have any difficulty performing the following activities: 11/26/2019  Hearing? N  Vision? N  Walking or climbing stairs? N  Dressing or bathing? N  Doing errands, shopping? N  Preparing Food and eating ? N  Using the Toilet? N  In the past six months, have you accidently leaked urine? Y  Comment leakage at times and wears a pads  Do you have problems with loss of bowel control? N  Managing your Medications? N  Managing your Finances? N  Housekeeping or  managing your Housekeeping? N  Some recent data might be hidden    Patient Care Team: Martinique, Lilyannah G, MD as PCP - General (Family Medicine) Troy Sine, MD as PCP - Cardiology (Cardiology) Awanda Mink Craige Cotta, RN as Oncology Nurse Navigator (Oncology) Eli Hose, [facility] (Inactive) as Pharmacist (Pharmacist) Dishmon, Garwin Brothers, RN as Oncology Nurse Navigator (Oncology) Viona Gilmore, [facility] as Pharmacist (Pharmacist)  Indicate any recent Medical Services you may have received from other than [facility] providers in the past year (date may be approximate).     Assessment:   This is a routine wellness examination for Haley [name].  Hearing/Vision screen  Hearing Screening   125Hz  250Hz  500Hz  1000Hz  2000Hz  3000Hz  4000Hz  6000Hz  8000Hz   Right ear:           Left ear:           Comments: Denies any hearing difficulty at this time  Vision Screening Comments: Pt follows Dr Gershon Crane for annual eye exams  Dietary issues and exercise activities discussed: Current Exercise Habits: The patient does not participate in regular exercise at present  Goals    . Patient Stated     Do more exercise      Depression Screen PHQ 2/9 Scores 11/26/2019 11/13/2018 02/21/2018 09/13/2017 12/02/2015 10/24/2014 08/13/2013  PHQ - 2 Score 1 0 1 0 0 1 0    Fall Risk Fall Risk  11/26/2019 11/13/2018 02/21/2018 12/02/2015 10/24/2014  Falls in the past year? 1 0 0 Yes No  Number falls in past yr: 1 0 - 2 or more -  Injury with Fall? 0 0 - No -  Risk for fall due to : Impaired vision - Other (Comment) - -  Risk for fall due to: Comment - - Fatigue related to chemo treatments. - -  Follow up Falls prevention discussed Education provided Falls prevention discussed - -    Any stairs in or around the home? Yes  If so, are there any without handrails? No  Home free of loose throw rugs in walkways, pet beds, electrical cords, etc? Yes  Adequate lighting in your home to reduce risk of falls? Yes   ASSISTIVE DEVICES UTILIZED TO  PREVENT FALLS:  Life alert? No  Use of a cane, walker or w/c? No  Grab bars in the bathroom? No  Shower chair or bench in shower? No  Elevated toilet seat or a handicapped toilet? No   TIMED UP AND GO:  Was the test performed? Yes .  Length of time to ambulate 10 feet: 10 sec.   Gait slow and steady without use of assistive device  Cognitive Function:     6CIT Screen 11/26/2019  What Year? 0 points  What month? 0 points  Count back from 20 0 points  Months in reverse 0 points  Repeat phrase 4 points    Immunizations Immunization History  Administered Date(s) Administered  . PFIZER SARS-COV-2 Vaccination 06/15/2019, 07/08/2019  . Pneumococcal Conjugate-13 11/03/2017  . Pneumococcal Polysaccharide-23 09/10/2009  . Td 10/03/2001  . Zoster 08/13/2013    TDAP status: Due, Education has been provided regarding the importance of this vaccine. Advised may receive this vaccine at local pharmacy or Health Dept. Aware to provide a copy of the vaccination record if obtained from local pharmacy or Health Dept. Verbalized acceptance and understanding. Flu Vaccine status: Declined, Education has been provided regarding the importance of this vaccine but patient still declined. Advised may receive this  vaccine at local pharmacy or Health Dept. Aware to provide a copy of the vaccination record if obtained from local pharmacy or Health Dept. Verbalized acceptance and understanding. Pneumococcal vaccine status: Up to date Covid-19 vaccine status: Completed vaccines  Qualifies for Shingles Vaccine? Yes   Zostavax completed Yes   Shingrix Completed?: No.    Education has been provided regarding the importance of this vaccine. Patient has been advised to call insurance company to determine out of pocket expense if they have not yet received this vaccine. Advised may also receive vaccine at local pharmacy or Health Dept. Verbalized acceptance and understanding.  Screening Tests Health  Maintenance  Topic Date Due  . TETANUS/TDAP  10/08/2020 (Originally 10/04/2011)  . COLONOSCOPY  10/19/2022  . DEXA SCAN  Completed  . COVID-19 Vaccine  Completed  . Hepatitis C Screening  Completed  . PNA vac Low Risk Adult  Completed  . INFLUENZA VACCINE  Discontinued    Health Maintenance  There are no preventive care reminders to display for this patient.  Colorectal cancer screening: Completed 10/18/12. Repeat every 10 years Mammogram status: Completed 04/03/19. Repeat every year Bone Density status: Completed 05/19/16. Results reflect: Bone density results: OSTEOPOROSIS. Repeat every 2 years.   Additional Screening:  Hepatitis C Screening: Completed 10/31/16  Vision Screening: Recommended annual ophthalmology exams for early detection of glaucoma and other disorders of the eye. Is the patient up to date with their annual eye exam?  Yes  Who is the provider or what is the name of the office in which the patient attends annual eye exams? Dr Gershon Crane   Dental Screening: Recommended annual dental exams for proper oral hygiene  Community Resource Referral / Chronic Care Management: CRR required this visit?  No   CCM required this visit?  No      Plan:     I have personally reviewed and noted the following in the patient's chart:   . Medical and social history . Use of alcohol, tobacco or illicit drugs  . Current medications and supplements . Functional ability and status . Nutritional status . Physical activity . Advanced directives . List of other physicians . Hospitalizations, surgeries, and ER visits in previous 12 months . Vitals . Screenings to include cognitive, depression, and falls . Referrals and appointments  In addition, I have reviewed and discussed with patient certain preventive protocols, quality metrics, and best practice recommendations. A written personalized care plan for preventive services as well as general preventive health recommendations  were provided to patient.     Willette Brace, LPN   [id number]   Nurse Notes: Pt stated she will follow up with oncologist concerning left arm swelling. Pt declined to see Dr Martinique today.

## 2019-11-26 NOTE — Patient Instructions (Addendum)
Haley [name] , Thank you for taking time to come for your Medicare Wellness Visit. I appreciate your ongoing commitment to your health goals. Please review the following plan we discussed and let me know if I can assist you in the future.   Screening recommendations/referrals: Colonoscopy: Done 10/18/12 Mammogram: Done 04/03/19 Bone Density: Done 05/19/16 Recommended yearly ophthalmology/optometry visit for glaucoma screening and checkup Recommended yearly dental visit for hygiene and checkup  Vaccinations: Influenza vaccine: Declined and discussed Pneumococcal vaccine: Up to date Tdap vaccine: Discussed and pt will follow up Shingles vaccine: Shingrix discussed. Please contact your pharmacy for coverage information.    Covid-19:Completed 06/15/19 & 07/08/19  Advanced directives: Copies in chart  Conditions/risks identified: start exercise more  Next appointment: Follow up in one year for your annual wellness visit    Preventive Care 65 Years and Older, Female Preventive care refers to lifestyle choices and visits with your health care provider that can promote health and wellness. What does preventive care include?  A yearly physical exam. This is also called an annual well check.  Dental exams once or twice a year.  Routine eye exams. Ask your health care provider how often you should have your eyes checked.  Personal lifestyle choices, including:  Daily care of your teeth and gums.  Regular physical activity.  Eating a healthy diet.  Avoiding tobacco and drug use.  Limiting alcohol use.  Practicing safe sex.  Taking low-dose aspirin every day.  Taking vitamin and mineral supplements as recommended by your health care provider. What happens during an annual well check? The services and screenings done by your health care provider during your annual well check will depend on your age, overall health, lifestyle risk factors, and family history of disease. Counseling    Your health care provider may ask you questions about your:  Alcohol use.  Tobacco use.  Drug use.  Emotional well-being.  Home and relationship well-being.  Sexual activity.  Eating habits.  History of falls.  Memory and ability to understand (cognition).  Work and work Statistician.  Reproductive health. Screening  You may have the following tests or measurements:  Height, weight, and BMI.  Blood pressure.  Lipid and cholesterol levels. These may be checked every 5 years, or more frequently if you are over 87 years old.  Skin check.  Lung cancer screening. You may have this screening every year starting at age 11 if you have a 30-pack-year history of smoking and currently smoke or have quit within the past 15 years.  Fecal occult blood test (FOBT) of the stool. You may have this test every year starting at age 56.  Flexible sigmoidoscopy or colonoscopy. You may have a sigmoidoscopy every 5 years or a colonoscopy every 10 years starting at age 4.  Hepatitis C blood test.  Hepatitis B blood test.  Sexually transmitted disease (STD) testing.  Diabetes screening. This is done by checking your blood sugar (glucose) after you have not eaten for a while (fasting). You may have this done every 1-3 years.  Bone density scan. This is done to screen for osteoporosis. You may have this done starting at age 20.  Mammogram. This may be done every 1-2 years. Talk to your health care provider about how often you should have regular mammograms. Talk with your health care provider about your test results, treatment options, and if necessary, the need for more tests. Vaccines  Your health care provider may recommend certain vaccines, such as:  Influenza vaccine.  This is recommended every year.  Tetanus, diphtheria, and acellular pertussis (Tdap, Td) vaccine. You may need a Td booster every 10 years.  Zoster vaccine. You may need this after age 58.  Pneumococcal 13-valent  conjugate (PCV13) vaccine. One dose is recommended after age 4.  Pneumococcal polysaccharide (PPSV23) vaccine. One dose is recommended after age 62. Talk to your health care provider about which screenings and vaccines you need and how often you need them. This information is not intended to replace advice given to you by your health care provider. Make sure you discuss any questions you have with your health care provider. Document Released: 01/23/2015 Document Revised: 09/16/2015 Document Reviewed: 10/28/2014 Elsevier Interactive Patient Education  2017 [place] Prevention in the Home Falls can cause injuries. They can happen to people of all ages. There are many things you can do to make your home safe and to help prevent falls. What can I do on the outside of my home?  Regularly fix the edges of walkways and driveways and fix any cracks.  Remove anything that might make you trip as you walk through a door, such as a raised step or threshold.  Trim any bushes or trees on the path to your home.  Use bright outdoor lighting.  Clear any walking paths of anything that might make someone trip, such as rocks or tools.  Regularly check to see if handrails are loose or broken. Make sure that both sides of any steps have handrails.  Any raised decks and porches should have guardrails on the edges.  Have any leaves, snow, or ice cleared regularly.  Use sand or salt on walking paths during winter.  Clean up any spills in your garage right away. This includes oil or grease spills. What can I do in the bathroom?  Use night lights.  Install grab bars by the toilet and in the tub and shower. Do not use towel bars as grab bars.  Use non-skid mats or decals in the tub or shower.  If you need to sit down in the shower, use a plastic, non-slip stool.  Keep the floor dry. Clean up any water that spills on the floor as soon as it happens.  Remove soap buildup in the tub or  shower regularly.  Attach bath mats securely with double-sided non-slip rug tape.  Do not have throw rugs and other things on the floor that can make you trip. What can I do in the bedroom?  Use night lights.  Make sure that you have a light by your bed that is easy to reach.  Do not use any sheets or blankets that are too big for your bed. They should not hang down onto the floor.  Have a firm chair that has side arms. You can use this for support while you get dressed.  Do not have throw rugs and other things on the floor that can make you trip. What can I do in the kitchen?  Clean up any spills right away.  Avoid walking on wet floors.  Keep items that you use a lot in easy-to-reach places.  If you need to reach something above you, use a strong step stool that has a grab bar.  Keep electrical cords out of the way.  Do not use floor polish or wax that makes floors slippery. If you must use wax, use non-skid floor wax.  Do not have throw rugs and other things on the floor that can make  you trip. What can I do with my stairs?  Do not leave any items on the stairs.  Make sure that there are handrails on both sides of the stairs and use them. Fix handrails that are broken or loose. Make sure that handrails are as long as the stairways.  Check any carpeting to make sure that it is firmly attached to the stairs. Fix any carpet that is loose or worn.  Avoid having throw rugs at the top or bottom of the stairs. If you do have throw rugs, attach them to the floor with carpet tape.  Make sure that you have a light switch at the top of the stairs and the bottom of the stairs. If you do not have them, ask someone to add them for you. What else can I do to help prevent falls?  Wear shoes that:  Do not have high heels.  Have rubber bottoms.  Are comfortable and fit you well.  Are closed at the toe. Do not wear sandals.  If you use a stepladder:  Make sure that it is fully  opened. Do not climb a closed stepladder.  Make sure that both sides of the stepladder are locked into place.  Ask someone to hold it for you, if possible.  Clearly mark and make sure that you can see:  Any grab bars or handrails.  First and last steps.  Where the edge of each step is.  Use tools that help you move around (mobility aids) if they are needed. These include:  Canes.  Walkers.  Scooters.  Crutches.  Turn on the lights when you go into a dark area. Replace any light bulbs as soon as they burn out.  Set up your furniture so you have a clear path. Avoid moving your furniture around.  If any of your floors are uneven, fix them.  If there are any pets around you, be aware of where they are.  Review your medicines with your doctor. Some medicines can make you feel dizzy. This can increase your chance of falling. Ask your doctor what other things that you can do to help prevent falls. This information is not intended to replace advice given to you by your health care provider. Make sure you discuss any questions you have with your health care provider. Document Released: 10/23/2008 Document Revised: 06/04/2015 Document Reviewed: 01/31/2014 Elsevier Interactive Patient Education  2017 [employer].

## 2019-12-02 ENCOUNTER — Other Ambulatory Visit: Payer: Self-pay | Admitting: Family Medicine

## 2019-12-02 DIAGNOSIS — F419 Anxiety disorder, unspecified: Secondary | ICD-10-CM

## 2019-12-09 ENCOUNTER — Inpatient Hospital Stay (HOSPITAL_BASED_OUTPATIENT_CLINIC_OR_DEPARTMENT_OTHER): Payer: PPO | Admitting: Hematology

## 2019-12-09 ENCOUNTER — Inpatient Hospital Stay (HOSPITAL_COMMUNITY): Payer: PPO

## 2019-12-09 ENCOUNTER — Other Ambulatory Visit: Payer: Self-pay

## 2019-12-09 ENCOUNTER — Encounter (HOSPITAL_COMMUNITY): Payer: Self-pay | Admitting: Hematology

## 2019-12-09 ENCOUNTER — Ambulatory Visit (HOSPITAL_COMMUNITY)
Admission: RE | Admit: 2019-12-09 | Discharge: 2019-12-09 | Disposition: A | Discharge status: Home / Self Care | Payer: PPO | Source: Ambulatory Visit | Attending: Hematology | Admitting: Hematology

## 2019-12-09 VITALS — BP 136/75 | HR 70 | Temp 97.3°F | Resp 18 | Wt 153.5 lb

## 2019-12-09 VITALS — BP 120/58 | HR 50 | Temp 97.0°F | Resp 16

## 2019-12-09 DIAGNOSIS — Z7189 Other specified counseling: Secondary | ICD-10-CM

## 2019-12-09 DIAGNOSIS — C561 Malignant neoplasm of right ovary: Secondary | ICD-10-CM

## 2019-12-09 DIAGNOSIS — M79606 Pain in leg, unspecified: Secondary | ICD-10-CM

## 2019-12-09 DIAGNOSIS — R079 Chest pain, unspecified: Secondary | ICD-10-CM | POA: Insufficient documentation

## 2019-12-09 DIAGNOSIS — Z5112 Encounter for antineoplastic immunotherapy: Secondary | ICD-10-CM | POA: Diagnosis not present

## 2019-12-09 DIAGNOSIS — M7989 Other specified soft tissue disorders: Secondary | ICD-10-CM | POA: Diagnosis not present

## 2019-12-09 DIAGNOSIS — I700 Atherosclerosis of aorta: Secondary | ICD-10-CM | POA: Diagnosis not present

## 2019-12-09 LAB — CBC WITH DIFFERENTIAL/PLATELET
Abs Immature Granulocytes: 0.05 10*3/uL (ref 0.00–0.07)
Basophils Absolute: 0.1 10*3/uL (ref 0.0–0.1)
Basophils Relative: 1 %
Eosinophils Absolute: 0.5 10*3/uL (ref 0.0–0.5)
Eosinophils Relative: 7 %
HCT: 34.7 % — ABNORMAL LOW (ref 36.0–46.0)
Hemoglobin: 11 g/dL — ABNORMAL LOW (ref 12.0–15.0)
Immature Granulocytes: 1 %
Lymphocytes Relative: 27 %
Lymphs Abs: 1.7 10*3/uL (ref 0.7–4.0)
MCH: 29.6 pg (ref 26.0–34.0)
MCHC: 31.7 g/dL (ref 30.0–36.0)
MCV: 93.3 fL (ref 80.0–100.0)
Monocytes Absolute: 0.6 10*3/uL (ref 0.1–1.0)
Monocytes Relative: 9 %
Neutro Abs: 3.6 10*3/uL (ref 1.7–7.7)
Neutrophils Relative %: 55 %
Platelets: 185 10*3/uL (ref 150–400)
RBC: 3.72 MIL/uL — ABNORMAL LOW (ref 3.87–5.11)
RDW: 14.4 % (ref 11.5–15.5)
WBC: 6.5 10*3/uL (ref 4.0–10.5)
nRBC: 0 % (ref 0.0–0.2)

## 2019-12-09 LAB — COMPREHENSIVE METABOLIC PANEL
ALT: 19 U/L (ref 0–44)
AST: 26 U/L (ref 15–41)
Albumin: 3.6 g/dL (ref 3.5–5.0)
Alkaline Phosphatase: 74 U/L (ref 38–126)
Anion gap: 7 (ref 5–15)
BUN: 14 mg/dL (ref 8–23)
CO2: 25 mmol/L (ref 22–32)
Calcium: 8.8 mg/dL — ABNORMAL LOW (ref 8.9–10.3)
Chloride: 103 mmol/L (ref 98–111)
Creatinine, Ser: 0.86 mg/dL (ref 0.44–1.00)
GFR, Estimated: >60 mL/min (ref >60)
Glucose, Bld: 104 mg/dL — ABNORMAL HIGH (ref 70–99)
Potassium: 3.3 mmol/L — ABNORMAL LOW (ref 3.5–5.1)
Sodium: 135 mmol/L (ref 135–145)
Total Bilirubin: 0.6 mg/dL (ref 0.3–1.2)
Total Protein: 7.5 g/dL (ref 6.5–8.1)

## 2019-12-09 LAB — TSH: TSH: 3.533 u[IU]/mL (ref 0.350–4.500)

## 2019-12-09 MED ORDER — OCTREOTIDE ACETATE 30 MG IM KIT
PACK | INTRAMUSCULAR | Status: AC
  Filled 2019-12-09: qty 1

## 2019-12-09 MED ORDER — IOHEXOL 350 MG/ML SOLN
75.0000 mL | Freq: Once | INTRAVENOUS | Status: AC | PRN
  Administered 2019-12-09: 75 mL via INTRAVENOUS

## 2019-12-09 MED ORDER — POTASSIUM CHLORIDE CRYS ER 20 MEQ PO TBCR
20.0000 meq | EXTENDED_RELEASE_TABLET | Freq: Once | ORAL | Status: AC
  Administered 2019-12-09: 20 meq via ORAL
  Filled 2019-12-09: qty 1

## 2019-12-09 MED ORDER — SODIUM CHLORIDE 0.9% FLUSH
10.0000 mL | INTRAVENOUS | Status: DC | PRN
  Administered 2019-12-09: 10 mL

## 2019-12-09 MED ORDER — SODIUM CHLORIDE 0.9 % IV SOLN
200.0000 mg | Freq: Once | INTRAVENOUS | Status: AC
  Administered 2019-12-09: 200 mg via INTRAVENOUS
  Filled 2019-12-09: qty 8

## 2019-12-09 MED ORDER — HEPARIN SOD (PORK) LOCK FLUSH 100 UNIT/ML IV SOLN
500.0000 [IU] | Freq: Once | INTRAVENOUS | Status: AC | PRN
  Administered 2019-12-09: 500 [IU]

## 2019-12-09 MED ORDER — SODIUM CHLORIDE 0.9 % IV SOLN: Freq: Once | INTRAVENOUS | Status: AC

## 2019-12-09 NOTE — Progress Notes (Signed)
STAT call report from [place] at [facility] Radiology:   IMPRESSION: 1. No demonstrable pulmonary embolus. No thoracic aortic aneurysm or dissection. There is aortic atherosclerosis as well as foci of calcification in visualized great vessels.  2. Extensive adenopathy in the axillary regions with multiple enlarged lymph nodes, slightly increased from previous study overall. A lesser degree of adenopathy elsewhere in the chest. Lymph node in the aortopulmonary window region is increased in size compared to previous study. Lymph node anterior to the distal trachea appears essentially stable. Lymph nodes in the upper abdomen in the medial perigastric/gastrohepatic ligament region appear essentially stable. Adenopathy consistent with known ovarian carcinoma.  3.  Lungs clear.  No pulmonary nodular opacities.  Dr. Delton Coombes aware.

## 2019-12-09 NOTE — Progress Notes (Signed)
Patient presents today for treatment and follow up visit with Dr. Delton Coombes. Labs within parameters for treatment today. Potassium 3.3 today. Vital signs within parameters for treatment. TSH 3.533 today.   Message received from [place] RN/ Dr. Delton Coombes to proceed with treatment today. Message received when patient's treatment completed she is to go by u/s and have an u/s performed due to arm swelling. PO potassium 20 mEq given per MD orders.

## 2019-12-09 NOTE — Progress Notes (Signed)
Patient was assessed by Dr. Delton Coombes and labs have been reviewed.  Potassium 3.3, Dr. Delton Coombes has instructed patient to eat more potassium rich foods, orders for potassium 20 mEq by mouth times once today.  Patient has swelling in left upper extremity, orders received for US venous doppler of that left arm.  She is also reporting tingling/ pins and needles in her chest for the last several days.  Orders received for CT angio chest to be done today also. Patient is okay to proceed with treatment today. Primary RN and pharmacy aware.

## 2019-12-09 NOTE — Patient Instructions (Signed)
[facility] Discharge Instructions for Patients Receiving Chemotherapy  Today you received the following chemotherapy agents   To help prevent nausea and vomiting after your treatment, we encourage you to take your nausea medication   If you develop nausea and vomiting that is not controlled by your nausea medication, call the clinic.   BELOW ARE SYMPTOMS THAT SHOULD BE REPORTED IMMEDIATELY:  *FEVER GREATER THAN 100.5 F  *CHILLS WITH OR WITHOUT FEVER  NAUSEA AND VOMITING THAT IS NOT CONTROLLED WITH YOUR NAUSEA MEDICATION  *UNUSUAL SHORTNESS OF BREATH  *UNUSUAL BRUISING OR BLEEDING  TENDERNESS IN MOUTH AND THROAT WITH OR WITHOUT PRESENCE OF ULCERS  *URINARY PROBLEMS  *BOWEL PROBLEMS  UNUSUAL RASH Items with * indicate a potential emergency and should be followed up as soon as possible.  Feel free to call the clinic should you have any questions or concerns. The clinic phone number is [phone].  Please show the CHEMO ALERT CARD at check-in to the Emergency Department and triage nurse.   

## 2019-12-09 NOTE — Progress Notes (Signed)
Patient presents today for treatment.  Vital signs stable.  Labs within parameters for treatment.  Patient complains of swelling in the left arm.  On inspection left arm is notably larger than the right.  Patient also complains of mid chest pain that comes and goes. Instructed that patient to discuss these concerns with  Dr. Delton Coombes during her appointment.  Patient expressed understanding.  Treatment given today per MD orders.  Tolerated infusion without adverse affects.  Vital signs stable.  No complaints at this time.  Instructed to go to radiology for ultra sound and CT of her chest per Dr. Fransico Michael order.  Discharge from clinic ambulatory in stable condition.  Alert and oriented X 3.  Follow up with [facility] as scheduled.

## 2019-12-09 NOTE — Progress Notes (Signed)
[place], [place] [id number]   CLINIC:  Medical Oncology/Hematology  PCP:  Haley [name], Haley G, MD [address] / [place] [address] [phone]   REASON FOR VISIT:  Follow-up for right ovarian cancer  PRIOR THERAPY:  1. Neoadjuvant chemotherapy with carboplatin and paclitaxel x 3 cycles from 01/18/2018 to 03/02/2018. 2. BSO and omentectomy on 03/27/2018 followed by 3 more cycles of carboplatin and paclitaxel from 04/20/2018 to 05/31/2018. 3. Doxil and Avastin x 5 cycles from 07/16/2018 to 11/14/2018.  NGS Results: PD-L1 CPS 5%, Foundation 1 MS--stable, TMB 5 Muts/Mb  CURRENT THERAPY: Keytruda every 3 weeks  BRIEF ONCOLOGIC HISTORY:  Oncology History Overview Note  Neg for genetics blood work and HRD High grade serous, recurrent biopsy proven PD-L1 5%  Relapsed within a few months of finishing carbo/taxol, minimal response to Doxil/Avastin, progressed on Gemzar   Right ovarian epithelial cancer ([facility])  01/06/2018 Imaging   Ct abdomen and pelvis 1. Complex cystic mass at the right adnexa, measuring 5.9 x 4.0 cm, with nodular components, concerning for primary ovarian malignancy. 2. Diffuse nodularity along the omentum at the left side of the abdomen, extending into the mesentery at the left mid abdomen, concerning for peritoneal carcinomatosis. 3. Wall thickening at the distal ileum adjacent to the ovarian mass; bowel loops appear somewhat adherent to the ovarian mass. Bowel infiltration with tumor cannot be excluded. No evidence of bowel obstruction at this time. 4. Small volume ascites within the abdomen and pelvis.  Aortic Atherosclerosis (ICD10-I70.0).   01/08/2018 Tumor Marker   Patient's tumor was tested for the following markers: CA-125. Results of the tumor marker test revealed 3004   01/15/2018 Imaging   Chest CT:  1. No active cardiopulmonary disease. 2. Aortic atherosclerosis without aneurysm or dissection. 3.  No large central pulmonary embolus.  CT AP:  1. Dilated fluid-filled loops of small bowel are redemonstrated slightly more extensive than on prior exam with transition point likely in the right adnexa adjacent to a complex cystic mass concerning for ovarian neoplasm given septations and soft tissue nodularity. This raises concern for early or partial SBO. This soft tissue mass measures 4.9 x 4 x 4.7 cm and has not changed since prior recent comparison. Additional short segmental area of luminal narrowing is noted in the right lower quadrant involving small bowel for which stigmata of peritoneal carcinomatosis or small-bowel metastatic implants might account for this. 2. Redemonstration of small volume of ascites predominantly in the upper abdomen surrounding the liver and spleen. 3. Redemonstration of thick bandlike omental thickening concerning for peritoneal carcinomatosis.    01/15/2018 Procedure   Successful ultrasound-guided diagnostic and therapeutic paracentesis yielding 1.5 liters of peritoneal fluid.    01/15/2018 Pathology Results   PERITONEAL/ASCITIC FLUID (SPECIMEN 1 OF 1 COLLECTED 01/18/18): MALIGNANT CELLS CONSISTENT WITH METASTATIC ADENOCARCINOMA. SEE COMMENT. COMMENT: THE MALIGNANT CELLS ARE POSITIVE FOR MOC-31, CYTOKERATIN 7, ESTROGEN RECEPTOR, PAX-8, AND WT-1. THEY ARE NEGATIVE FOR CALRETININ, CYTOKERATIN 5/6, AND CYTOKERATIN 20. THE PROFILE IS CONSISTENT WITH A PRIMARY GYNECOLOGIC CARCINOMA. THERE IS LIKELY SUFFICIENT TUMOR PRESENT, IF ADDITIONAL STUDIES ARE REQUESTED.   01/15/2018 - 02/02/2018 Hospital Admission   She was admitted to the hospital for SBO. She was treated with chemotherapy   01/18/2018 - 06/20/2018 Chemotherapy   The patient had carboplatin and taxol   02/08/2018 Cancer Staging   Staging form: Ovary, Fallopian Tube, and Primary Peritoneal Carcinoma, AJCC 8th Edition - Clinical: cT3, cN0, cM0 - Signed by Heath Lark,  MD on 02/08/2018   02/08/2018 Tumor Marker    Patient's tumor was tested for the following markers: CA-125. Results of the tumor marker test revealed 445   03/02/2018 Tumor Marker   Patient's tumor was tested for the following markers: CA-125. Results of the tumor marker test revealed 174   03/15/2018 Imaging   6.2 cm complex cystic right ovarian mass, compatible with malignant ovarian neoplasm, mildly progressive.  Associated peritoneal disease/omental caking beneath the anterior abdominal wall, mildly improved. Prior abdominal ascites is improved/resolved.  4.7 cm cystic left ovarian mass, without overt malignant features, grossly unchanged.  Mild bilateral hydroureteronephrosis, secondary to extrinsic compression, new.  Prior small bowel obstruction has improved/resolved.    03/27/2018 Pathology Results   1. Omentum, resection for tumor - OMENTUM: - HIGH GRADE SEROUS CARCINOMA. 2. Ovary, left - LEFT OVARY: - HIGH GRADE SEROUS CARCINOMA. - LEFT FALLOPIAN TUBE: - HIGH GRADE SEROUS CARCINOMA. 3. Adnexa - ovary +/- tube, neoplastic, right - RIGHT OVARY: - HIGH GRADE SEROUS CARCINOMA, 5.5 CM. - RIGHT FALLOPIAN TUBE: - HIGH GRADE SEROUS CARCINOMA. 4. Peritoneum, biopsy, nodule - PERITONEUM: - NODULES OF HIGH GRADE SEROUS CARCINOMA. 5. Peritoneum, resection for tumor, rectosigmoid - RECTOSIGMOID PERITONEUM: - HIGH GRADE SEROUS CARCINOMA. Microscopic Comment 3. OVARY or FALLOPIAN TUBE or PRIMARY PERITONEUM: Procedure: Bilateral salpingo-oophorectomy, omentectomy and peritoneal biopsies. Specimen Integrity: N/A. Tumor Site: Right ovary. Ovarian Surface Involvement (required only if applicable): Yes. Fallopian Tube Surface Involvement (required only if applicable): Yes. Tumor Size: 5.5 x 4.8 x 4.0 cm. Histologic Type: Serous carcinoma. Histologic Grade: High grade. Implants (required for advanced stage serous/seromucinous borderline tumors only): Omentum, left ovary and fallopian tube, rectosigmoid peritoneum and  peritoneum. Other Tissue/ Organ Involvement: Left ovary and fallopian tube, omentum and rectosigmoid peritoneum. Largest Extrapelvic Peritoneal Focus (required only if applicable): 56.2 cm, omentum. Peritoneal/Ascitic Fluid: N/A. Treatment Effect (required only for high-grade serous carcinomas): Minimal. Regional Lymph Nodes: No lymph nodes submitted. Pathologic Stage Classification (pTNM, AJCC 8th Edition): pT3c, pNX. Representative Tumor Block: 3A, 3B, 3D, 3E and 98F. Comment(s): There is a 5.5 cm in greatest dimension partially cystic high grade serous carcinoma involving the right adnexal specimen and the tumor is staged as a primary right ovarian carcinoma. The carcinoma also involves the left ovary as well as the left fallopian tube, the omentum, peritoneum and the rectosigmoid peritoneal specimens.   03/27/2018 Surgery   Preoperative Diagnosis: ovarian cancer, stage IIIC, metastatic to omentum, peritoneum, serosa of intestines   Procedure(s) Performed: 1. Exploratory laparotomy with bilateral salpingo-oophorectomy, omentectomy radical tumor debulking for ovarian cancer .  Surgeon: Thereasa Solo, MD.   Specimens: Bilateral tubes / ovaries, omentum. Peritoneal nodules, rectosigmoid nodules.    Operative Findings: omental cake, miliary studding on diaphragm and bilateral paracolic gutters. Sigmoid colon densely adherent to left and right ovaries with tumor rind, ureters mildly dilated bilaterally due to retroperitoneal extension of the tumor towards ureters causing compression.    This represented an optimal cytoreduction (R1) with gross residual disease on the diaphragm that had been ablated and a thin tumor plaque on the sigmoid colon that was ablated. No residual disease >1cm remaining   05/07/2018 Genetic Testing   Negative genetic testing on the [facility] TumorNextHRD+ CancerNext Panel. The CancerNext gene panel offered by [employer] includes sequencing and rearrangement analysis for  the following 34 genes:   APC, ATM, BARD1, BMPR1A, BRCA1, BRCA2, BRIP1, CDH1, CDK4, CDKN2A, CHEK2, DICER1, EPCAM, GREM1, HOXB13, MLH1, MRE11A, MSH2, MSH6, MUTYH, NBN, NF1, PALB2, PMS2, POLD1, POLE,  PTEN, RAD50, RAD51C, RAD51D, SMAD4, SMARCA4, STK11, and TP53. Somatic genes analyzed through TumorNext-HRD: ATM, BARD1, BRCA1, BRCA2, BRIP1, CHEK2, MRE11A, NBN, PALB2, RAD51C, RAD51D. The report date is 05/07/2018.   05/11/2018 Tumor Marker   Patient's tumor was tested for the following markers: CA-125. Results of the tumor marker test revealed 60.5   07/02/2018 Imaging   1. Mild right pericardiophrenic adenopathy is mildly increased, suspicious for metastatic nodes. No abdominopelvic adenopathy. 2. Small left peritoneal soft tissue nodule adjacent to the splenic flexure and smooth left pelvic sidewall peritoneal thickening, cannot exclude residual/recurrent disease. Attention on follow-up CT advised. 3. Bilateral renal collecting system dilatation has improved. 4.  Aortic Atherosclerosis (ICD10-I70.0).   07/02/2018 Tumor Marker   Patient's tumor was tested for the following markers: CA-125 Results of the tumor marker test revealed 42.7   07/11/2018 Echocardiogram   1. The left ventricle has normal systolic function with an ejection fraction of 60-65%. The cavity size was normal. Left ventricular diastolic parameters were normal.  2. Normal GLS -20.1.  3. The right ventricle has normal systolic function. The cavity was normal. There is no increase in right ventricular wall thickness.  4. The mitral valve is degenerative. Moderate thickening of the mitral valve leaflet. Moderate calcification of the mitral valve leaflet.  5. The aortic valve is tricuspid. Moderate thickening of the aortic valve. Moderate calcification of the aortic valve. Aortic valve regurgitation is trivial by color flow Doppler.  6. The aortic root is normal in size and structure.   07/16/2018 - 12/03/2018 Chemotherapy   The patient had  doxorubicin and bevacizumab for chemotherapy treatment.     07/30/2018 Tumor Marker   Patient's tumor was tested for the following markers: CA-125 Results of the tumor marker test revealed 82.6   08/27/2018 Tumor Marker   Patient's tumor was tested for the following markers: CA-125 Results of the tumor marker test revealed 111.   09/24/2018 Tumor Marker   Patient's tumor was tested for the following markers: CA-125 Results of the tumor marker test revealed 142.   10/05/2018 Imaging   1. Stable right juxta diaphragmatic/pericardiophrenic lymph node, mildly enlarged. 2. Otherwise no definite metastatic disease identified in the abdomen/pelvis. 3. 3 mm peritoneal nodule noted left paracolic gutter on today's study, indeterminate. Attention on follow-up recommended. 4.  Aortic Atherosclerois (ICD10-170.0)   10/16/2018 Echocardiogram    1. No significant change from prior study (07/11/2018).  2. Left ventricular ejection fraction, by visual estimation, is 60 to 65%. The left ventricle has normal function. Normal left ventricular size. There is no left ventricular hypertrophy.  3. LVEF by 3D assessment 63%.  4. The average left ventricular global longitudinal strain is -22.4 %.  5. Global right ventricle has normal systolic function.The right ventricular size is normal. No increase in right ventricular wall thickness.  6. Left atrial size was normal.  7. Right atrial size was normal.  8. Presence of pericardial fat pad.  9. Moderate thickening of the mitral valve leaflet(s). 10. The mitral valve is normal in structure. Trace mitral valve regurgitation. 11. The tricuspid valve is normal in structure. Tricuspid valve regurgitation is mild. 12. Aortic regurgitation PHT measures 467 msec. 13. The aortic valve is tricuspid Aortic valve regurgitation is mild by color flow Doppler. Mild aortic valve sclerosis without stenosis. 14. There is mild to moderate calcification of the AoV, with focal  calcification of the [place]. The aortic regurgitation is mild in severity, likely related to degenerative valve disease. 15. The pulmonic valve was grossly  normal. Pulmonic valve regurgitation is not visualized by color flow Doppler. 16. Mild plaque invoving the ascending aorta. 17. Normal pulmonary artery systolic pressure. 18. The tricuspid regurgitant velocity is 2.52 m/s, and with an assumed right atrial pressure of 3 mmHg, the estimated right ventricular systolic pressure is normal at 28.4 mmHg.   10/26/2018 Tumor Marker   Patient's tumor was tested for the following markers: CA-125 Results of the tumor marker test revealed 196   12/11/2018 Imaging   1. No substantial interval change in exam. No definite findings to suggest recurrent/metastatic disease. 2. Right pericardial phrenic lymph node noted on multiple prior studies is unchanged in the interval. 3. 8 mm omental nodule identified on today's exam. Close attention on follow-up recommended. 3 mm soft tissue nodule along the left paracolic gutter seen previously has resolved in the interval. 4.  Aortic Atherosclerois (ICD10-170.0)   04/02/2019 Imaging   1. Interval development of left axillary and subpectoral lymphadenopathy is highly concerning for metastatic disease, however, this would be a highly unusual pattern of metastatic spread for an ovarian primary neoplasm. This is most concerning for potential left-sided breast cancer. Correlation with mammography is strongly recommended. 2. No other signs of definite metastatic disease noted elsewhere in the chest, abdomen or pelvis. 3. Aortic atherosclerosis. 4. Additional incidental findings, similar to prior studies, as above.     04/03/2019 Pathology Results   Immunohistochemistry shows the carcinoma is positive with cytokeratin AE1/AE3, cytokeratin 7, PAX-8, WT-1, and shows patchy positivity with estrogen receptor. The carcinoma is negative with GATA3, GCDFP, p53, Napsin A and TTF-1. The  immunophenotype is most consistent with metastatic high grade serous carcinoma. (JDP:ah 04/04/19)FINAL DIAGNOSIS Diagnosis Lymph node, needle/core biopsy, left axilla - METASTATIC CARCINOMA. - SEE MICROSCOPIC DESCRIPT    Genetic Testing   Patient has genetic testing done for PD-L1. Results revealed patient has the following: PD-L1 staining in tumor cells (TC): 3% PD-L1 staining in tumor-associated immune cells (IC): 10% PD-L1 combined positive score (CPS): 5%   04/15/2019 - 06/13/2019 Chemotherapy   The patient had gemzar for chemotherapy treatment.     06/21/2019 Imaging   1. Bulky LEFT axillary adenopathy greater than RIGHT axillary adenopathy increasing in size. 2. Developing nodularity and or small lymph nodes along the LEFT hemidiaphragm in the inferior pre pericardial fat. Attention on follow-up. 3. Stable small lymph nodes throughout the retroperitoneum. 4. Stable low-density hepatic lesions compatible with cysts 5. Aortic atherosclerosis.   Aortic Atherosclerosis (ICD10-I70.0).     10/03/2019 Genetic Testing   Foundation One Results:     11/18/2019 -  Chemotherapy   The patient had pembrolizumab (KEYTRUDA) 200 mg in sodium chloride 0.9 % 50 mL chemo infusion, 200 mg, Intravenous, Once, 1 of 6 cycles Administration: 200 mg (11/18/2019)  for chemotherapy treatment.      CANCER STAGING: Cancer Staging Right ovarian epithelial cancer ([facility]) Staging form: Ovary, Fallopian Tube, and Primary Peritoneal Carcinoma, AJCC 8th Edition - Clinical: cT3, cN0, cM0 - Signed by Artis Delay, MD on 02/08/2018   INTERVAL HISTORY:  [name], a 74 y.o. female, returns for routine follow-up and consideration for next cycle of immunotherapy. Terea was last seen on 11/18/2019.  Due for cycle #2 of Keytruda today.   Today she is accompanied by her husband. Overall, she tells me she has been feeling pretty well. She tolerated the previous treatment well and denies having skin rashes,  diarrhea or extreme fatigue. She complains of having swelling in her left upper and lower arm  and also intermittent shooting pain along the lower anterior ribs lasting a several seconds and pin pricks in her sternum lasting a short time; she denies having any relation to eating, though she reports having some heartburn a couple days ago. She reports that the chest pin pricks and rib pain occur even in the mornings when her bra is off and for the past 2 days the pain has increased in frequency and severity. She denies pleuritic CP or angina.  Overall, she feels ready for next cycle of immunotherapy today.    REVIEW OF SYSTEMS:  Review of Systems  Constitutional: Negative for appetite change and fatigue.  Respiratory: Negative for chest tightness.   Cardiovascular: Positive for chest pain (constant pain along anterior lower ribs and pin pricks in sternum).       L arm swollen  Gastrointestinal: Positive for blood in stool (bright red blood after BM on 11/28) and nausea. Negative for diarrhea.  Skin: Negative for rash.  Psychiatric/Behavioral: Positive for depression.  All other systems reviewed and are negative.   PAST MEDICAL/SURGICAL HISTORY:  Past Medical History:  Diagnosis Date   Allergy    SEASONAL   Anxiety    Cataract    BILATERAL   Dysrhythmia    Family history of lung cancer    Family history of prostate cancer    Family history of prostate cancer    Family history of uterine cancer    Fibromyalgia    GERD (gastroesophageal reflux disease)    H/O echocardiogram 04/28/08   EF>55% trace mitral regurgitation, No significant valvular pathology   Hemorrhoids    internal   History of stress test 02/08/2011   Normal Myocardial perfusion study, this is a low risk scan, No prior study available for comparison   Hyperlipidemia    Hypertension    hx of   Hypothyroidism    MVP (mitral valve prolapse)    mild and MR   OA (osteoarthritis)    Osteoporosis     ovarian ca dx'd 12/2017   ovarian cancer   Paroxysmal A-fib ([facility])    Sleep apnea    does not use prescribed CPAP  does not tolerate   Thyroid disease    HYPO   Varicosities of leg    Vitamin D deficiency    Past Surgical History:  Procedure Laterality Date   [place]   with hysterectomy   BILATERAL SALPINGECTOMY N/A 03/27/2018   Procedure: EXPLORATORY LAPARATOMY, BILATERAL SALPINGOOPHERECTOMY;  Surgeon: Everitt Amber, MD;  Location: [facility];  Service: Gynecology;  Laterality: N/A;   BREAST EXCISIONAL BIOPSY Left 1995   Benign   COLONOSCOPY  11-20-2000   DEBULKING N/A 03/27/2018   Procedure: RADICAL TUMOR DEBULKING;  Surgeon: Everitt Amber, MD;  Location: [facility];  Service: Gynecology;  Laterality: N/A;   IR IMAGING GUIDED PORT INSERTION  01/17/2018   NOSE SURGERY  1990   OMENTECTOMY N/A 03/27/2018   Procedure: OMENTECTOMY;  Surgeon: Everitt Amber, MD;  Location: [facility];  Service: Gynecology;  Laterality: N/A;    SOCIAL HISTORY:  Social History   Socioeconomic History   Marital status: Married    Spouse name: Not on file   Number of children: 3   Years of education: Not on file   Highest education level: Not on file  Occupational History   Occupation: retired  Tobacco Use   Smoking status: Light Tobacco Smoker    Years: 10.00    Types: Cigars  Smokeless tobacco: Never Used   Tobacco comment: not daily  [place] cigars 1-2   Vaping Use   Vaping Use: Never used  Substance and Sexual Activity   Alcohol use: No    Alcohol/week: 0.0 standard drinks   Drug use: No   Sexual activity: Not Currently    Comment: 1st intercourse 17yo-1 partner  Other Topics Concern   Not on file  Social History Narrative   Not on file   Social Determinants of Health   Financial Resource Strain: Low Risk    Difficulty of Paying Living Expenses: Not hard at all  Food Insecurity: No Food Insecurity   Worried About Ship broker in the Last Year: Never true   [place] in the Last Year: Never true  Transportation Needs: No Transportation Needs   Lack of Transportation (Medical): No   Lack of Transportation (Non-Medical): No  Physical Activity: Inactive   Days of Exercise per Week: 0 days   Minutes of Exercise per Session: 0 min  Stress: Stress Concern Present   Feeling of Stress : To some extent  Social Connections: Moderately Isolated   Frequency of Communication with Friends and Family: More than three times a week   Frequency of Social Gatherings with Friends and Family: Three times a week   Attends Religious Services: Never   Active Member of Clubs or Organizations: No   Attends Music therapist: Never   Marital Status: Married  Human resources officer Violence: Not At Risk   Fear of Current or Ex-Partner: No   Emotionally Abused: No   Physically Abused: No   Sexually Abused: No    FAMILY HISTORY:  Family History  Problem Relation Age of Onset   Diabetes Daughter    Psoriasis Daughter    Heart defect Daughter    Diabetes Mother    Hypertension Mother    Heart disease Mother    Stroke Maternal Grandmother    Heart attack Brother    Heart attack Brother    Prostatitis Brother    Lung cancer Niece    Cancer Paternal Aunt        type of cancer unk   Prostate cancer Paternal Uncle    Uterine cancer Cousin        dx under 56   Colon cancer Neg Hx     CURRENT MEDICATIONS:  Current Outpatient Medications  Medication Sig Dispense Refill   ALPRAZolam (XANAX) 0.5 MG tablet TAKE 1/2 TO 1 (ONE-HALF TO ONE) TABLET BY MOUTH ONCE DAILY AS NEEDED FOR ANXIETY 30 tablet 3   alum & mag hydroxide-simeth (MAALOX/MYLANTA) 200-200-20 MG/5ML suspension Take 30 mLs by mouth every 6 (six) hours as needed for indigestion or heartburn. 355 mL 0   Artificial Saliva (SALIVASURE) LOZG Use as directed 1 lozenge in the mouth or throat every 4 (four) hours as needed.  90 lozenge 0   Calcium Carbonate (CALTRATE 600) 1500 MG TABS Take 600 mg of elemental calcium by mouth daily.      cholecalciferol (VITAMIN D) 1000 UNITS tablet Take 1,000 Units by mouth daily.      estradiol (ESTRACE) 0.1 MG/GM vaginal cream Place 1 Applicatorful vaginally 3 (three) times a week. 42.5 [name] 12   fluticasone (CUTIVATE) 0.05 % cream APPLY CREAM TOPICALLY UP TO TWICE DAILY AS NEEDED FOR RASH     fluticasone (FLONASE) 50 MCG/ACT nasal spray Place 2 sprays into both nostrils daily. 48 [name] 2   gabapentin (NEURONTIN) 300 MG  capsule Take 1 capsule (300 mg total) by mouth 2 (two) times daily. 60 capsule 3   HYDROmorphone (DILAUDID) 2 MG tablet Take 1 tablet (2 mg total) by mouth daily as needed for severe pain. 30 tablet 0   lactose free nutrition (BOOST PLUS) LIQD Take 237 mLs by mouth daily. 10 Can 0   levothyroxine (SYNTHROID) 75 MCG tablet Take 1 tablet by mouth every day 90 tablet 1   lidocaine-prilocaine (EMLA) cream Apply to affected area once (Patient not taking: Reported on 11/18/2019) 30 [name] 3   losartan (COZAAR) 25 MG tablet Take 1 tablet (25 mg total) by mouth daily. 30 tablet 2   Magnesium 400 MG TABS Take 250 mg by mouth daily.     Menthol-Methyl Salicylate (MUSCLE RUB) 10-15 % CREA Apply 1 application topically as needed for muscle pain.     metoprolol succinate (TOPROL-XL) 50 MG 24 hr tablet Take 1 tablet (50 mg total) by mouth daily. Take with or immediately following a meal. 90 tablet 1   Multiple Vitamins-Minerals (MULTIVITAMIN WITH MINERALS) tablet Take 1 tablet by mouth daily.       Omega-3 Fatty Acids (FISH OIL) 1000 MG CAPS Take 2,000 mg by mouth daily.      omeprazole (PRILOSEC) 40 MG capsule Take 1 capsule (40 mg total) by mouth daily. 90 capsule 3   Pembrolizumab (KEYTRUDA IV) Inject 200 mg into the vein every 21 ( twenty-one) days.     polyethylene glycol (MIRALAX / GLYCOLAX) packet Take 17 [name] by mouth daily. 14 each 0   senna (SENOKOT) 8.6 MG TABS  tablet Take 1 tablet (8.6 mg total) by mouth at bedtime as needed for mild constipation. 120 each 0   simvastatin (ZOCOR) 20 MG tablet Take 1 tablet (20 mg total) by mouth at bedtime. 90 tablet 1   tretinoin (RETIN-A) 0.025 % cream APPLY TOPICALLY TO FACE AT BEDTIME AS NEEDED     vitamin B-12 (CYANOCOBALAMIN) 1000 MCG tablet Take 1,000 mcg by mouth daily.     vitamin C (ASCORBIC ACID) 500 MG tablet Take 1,000 mg by mouth daily.     vitamin E 45 MG (100 UNITS) capsule Take by mouth daily.     XARELTO 20 MG TABS tablet TAKE 1 TABLET BY MOUTH ONCE DAILY WITH SUPPER 90 tablet 2   zinc gluconate 50 MG tablet Take 50 mg by mouth daily.     No current facility-administered medications for this visit.    ALLERGIES:  Allergies  Allergen Reactions   Tramadol Hcl     jittery    PHYSICAL EXAM:  Performance status (ECOG): 1 - Symptomatic but completely ambulatory  Vitals:   12/09/19 0825  BP: 136/75  Pulse: 70  Resp: 18  Temp: (!) 97.3 F (36.3 C)  SpO2: 97%   Wt Readings from Last 3 Encounters:  12/09/19 153 lb 8 oz (69.6 kg)  11/26/19 152 lb (68.9 kg)  11/18/19 155 lb 9.6 oz (70.6 kg)   Physical Exam Vitals reviewed.  Constitutional:      Appearance: Normal appearance.  Cardiovascular:     Rate and Rhythm: Normal rate and regular rhythm.     Pulses: Normal pulses.     Heart sounds: Normal heart sounds.  Pulmonary:     Effort: Pulmonary effort is normal.     Breath sounds: Normal breath sounds.  Chest:     Chest wall: No tenderness.     Comments: Port-a-Cath in R chest Lymphadenopathy:     Upper  Body:     Right upper body: Axillary adenopathy present.     Left upper body: Axillary adenopathy present.  Skin:    Findings: No lesion or rash.  Neurological:     General: No focal deficit present.     Mental Status: She is alert and oriented to person, place, and time.  Psychiatric:        Mood and Affect: Mood normal.        Behavior: Behavior normal.      LABORATORY DATA:  I have reviewed the labs as listed.  CBC Latest Ref Rng & Units 12/09/2019 11/18/2019 09/09/2019  WBC 4.0 - 10.5 K/uL 6.5 6.2 7.4  Hemoglobin 12.0 - 15.0 [name]/dL 11.0(L) 11.0(L) 12.8  Hematocrit 36 - 46 % 34.7(L) 35.0(L) 41.5  Platelets 150 - 400 K/uL 185 176 175   CMP Latest Ref Rng & Units 12/09/2019 11/18/2019 11/13/2019  Glucose 70 - 99 mg/dL 104(H) 92 92  BUN 8 - 23 mg/dL $Remove'14 12 13  'ukJUXSH$ Creatinine 0.44 - 1.00 mg/dL 0.86 0.79 0.92  Sodium 135 - 145 mmol/L 135 136 139  Potassium 3.5 - 5.1 mmol/L 3.3(L) 4.1 4.8  Chloride 98 - 111 mmol/L 103 104 102  CO2 22 - 32 mmol/L $RemoveB'25 26 25  'yDdSjjYA$ Calcium 8.9 - 10.3 mg/dL 8.8(L) 9.1 9.4  Total Protein 6.5 - 8.1 [name]/dL 7.5 6.8 7.6  Total Bilirubin 0.3 - 1.2 mg/dL 0.6 0.5 0.4  Alkaline Phos 38 - 126 U/L 74 64 104  AST 15 - 41 U/L $Remo'26 22 22  'XgEUe$ ALT 0 - 44 U/L $Remo'19 19 18   'nQbvA$ Lab Results  Component Value Date   CA125 1.567.0 (H) 11/18/2019   CA125 768.0 (H) 09/09/2019   CA125 196.0 (H) 10/22/2018    DIAGNOSTIC IMAGING:  I have independently reviewed the scans and discussed with the patient. No results found.   ASSESSMENT:  1. Platinum resistant high-grade serous ovarian carcinoma: -Neoadjuvant chemotherapy with 3 cycles of carboplatin and paclitaxel from 01/18/2018 through 03/02/2018 presentation as stage IIIc -03/27/2018-BSO,omentectomy with pathology showing high-grade serous carcinoma, PT3CP NX followed by 3 more cycles of carboplatin and paclitaxel from 04/20/2018 through 05/31/2018. -5 cycles of Doxil and Avastin from 07/16/2018 through 11/14/2018 -Left axillary lymph node biopsy on 04/03/2019 consistent with high-grade serous carcinoma. -Third line therapy with gemcitabine 3 cycles from 04/15/2019 through 06/03/2019. -CT scan of the AP on 06/21/2019 shows bulky left axillary adenopathy greater than the right axillary adenopathy increasing in size, developing nodularity and/or small lymph nodes along the left hemidiaphragm in the inferior  prepericardial fat. Stable small lymph nodes throughout the retroperitoneum. -She was seen by Drs. Alvy Bimler and Dr. Denman George and was given palliative options including chemotherapy with topotecan, docetaxel and pembrolizumab. -PD-L1 CPS score is 6%. -Germline and somatic mutation testing negative on 04/11/2018. -Foundation 1 testing did not show targetable mutations. -CT CAP from 10/03/2019 shows bulky axillary adenopathy enlarged on the right side and stable on the left axilla. Increasing size of the mediastinal lymph nodes. Soft tissue implant along the ascending colon. Enlarging lymph nodes and increasing number of lymph nodes in the upper abdomen. -CA-125 is worsening at 768. -Keytruda for cycle started on 11/18/2019.  2. Social/family history: -Patient smokes half pack per day of cigarettes. -Family history significant for maternal niece with breast cancer.   PLAN:  1. Platinum resistant high-grade serous ovarian carcinoma: -She has tolerated first cycle of Keytruda without any immunotherapy related side effects. -I have reviewed her labs today which showed normal  LFTs.  CBC was grossly normal.  TSH was 3.5. -We will proceed with Keytruda today. -Continue Keytruda in 3 weeks.  RTC 6 weeks with labs.  We will also plan to repeat CT CAP in 6 weeks to evaluate response for immunotherapy.  We will check CA-125 level in 3 weeks.  2.  Atypical chest wall pain and left upper extremity swelling: -She reported atypical chest wall pain mainly at the level of the anterior bra strap, sharp pains lasting few seconds for the last 2 to 3 weeks.  Pain is not worse on exertion.  No relation to breathing.  No radiation. -I have done a CT scan of the chest PE protocol which did not show any evidence of pulmonary embolism. -Left upper extremity Doppler was also negative.  Swelling is likely from lymphedema. -CT scan showed slight worsening of lymphadenopathy in the axillary region, chest when compared to CT  scan of the chest from 10/03/2019.   Orders placed this encounter:  Orders Placed This Encounter  Procedures   US Venous Img Upper Uni Left   CT CHEST ABDOMEN PELVIS W CONTRAST   CT ANGIO CHEST PE W OR WO CONTRAST     Derek Jack, MD [place] [phone]   I, Milinda Antis, am acting as a scribe for Dr. Sanda Linger.  I, Derek Jack MD, have reviewed the above documentation for accuracy and completeness, and I agree with the above.

## 2019-12-09 NOTE — Patient Instructions (Addendum)
[place] at [facility] Discharge Instructions  You were seen today by Dr. Delton Coombes. He went over your recent results. You received your treatment today; your next treatment will be in 3 weeks. You will be scheduled for a doppler ultrasound of your left arm to rule out a blood clot. You will also be scheduled for a CT scan of your chest to determine a possible source of your chest/ribs pain. You will be scheduled for a CT scan of your chest and abdomen before your next visit. Eat a variety of fruits daily to maintain your potassium levels. Dr. Delton Coombes will see you back in 6 weeks for labs and follow up.   Thank you for choosing [place] at [facility] to provide your oncology and hematology care.  To afford each patient quality time with our provider, please arrive at least 15 minutes before your scheduled appointment time.   If you have a lab appointment with the [facility] please come in thru the Main Entrance and check in at the main information desk  You need to re-schedule your appointment should you arrive 10 or more minutes late.  We strive to give you quality time with our providers, and arriving late affects you and other patients whose appointments are after yours.  Also, if you no show three or more times for appointments you may be dismissed from the clinic at the providers discretion.     Again, thank you for choosing [facility].  Our hope is that these requests will decrease the amount of time that you wait before being seen by our physicians.       _____________________________________________________________  Should you have questions after your visit to [facility], please contact our office at [phone] between the hours of 8:00 a.m. and 4:30 p.m.  Voicemails left after 4:00 p.m. will not be returned until the following business day.  For prescription refill requests, have your pharmacy contact  our office and allow 72 hours.    Cancer Center Support Programs:   > Cancer Support Group  2nd Tuesday of the month 1pm-2pm, Journey Room

## 2019-12-10 ENCOUNTER — Other Ambulatory Visit (HOSPITAL_COMMUNITY): Payer: Self-pay | Admitting: *Deleted

## 2019-12-13 ENCOUNTER — Other Ambulatory Visit: Payer: Self-pay

## 2019-12-13 MED ORDER — LEVOTHYROXINE SODIUM 75 MCG PO TABS: 75.0000 ug | ORAL_TABLET | Freq: Every day | ORAL | 3 refills | Status: AC

## 2019-12-24 ENCOUNTER — Other Ambulatory Visit: Payer: Self-pay

## 2019-12-24 ENCOUNTER — Encounter: Payer: Self-pay | Admitting: Family Medicine

## 2019-12-24 ENCOUNTER — Ambulatory Visit (INDEPENDENT_AMBULATORY_CARE_PROVIDER_SITE_OTHER): Payer: PPO | Admitting: Family Medicine

## 2019-12-24 ENCOUNTER — Encounter: Payer: PPO | Admitting: Family Medicine

## 2019-12-24 VITALS — BP 128/70 | HR 47 | Temp 97.9°F | Resp 16 | Ht 62.0 in | Wt 154.4 lb

## 2019-12-24 DIAGNOSIS — F419 Anxiety disorder, unspecified: Secondary | ICD-10-CM | POA: Diagnosis not present

## 2019-12-24 DIAGNOSIS — Z0000 Encounter for general adult medical examination without abnormal findings: Secondary | ICD-10-CM | POA: Diagnosis not present

## 2019-12-24 DIAGNOSIS — G8929 Other chronic pain: Secondary | ICD-10-CM

## 2019-12-24 DIAGNOSIS — N952 Postmenopausal atrophic vaginitis: Secondary | ICD-10-CM

## 2019-12-24 DIAGNOSIS — C561 Malignant neoplasm of right ovary: Secondary | ICD-10-CM

## 2019-12-24 DIAGNOSIS — I890 Lymphedema, not elsewhere classified: Secondary | ICD-10-CM | POA: Diagnosis not present

## 2019-12-24 DIAGNOSIS — I10 Essential (primary) hypertension: Secondary | ICD-10-CM

## 2019-12-24 DIAGNOSIS — I480 Paroxysmal atrial fibrillation: Secondary | ICD-10-CM | POA: Diagnosis not present

## 2019-12-24 DIAGNOSIS — R001 Bradycardia, unspecified: Secondary | ICD-10-CM | POA: Diagnosis not present

## 2019-12-24 DIAGNOSIS — R109 Unspecified abdominal pain: Secondary | ICD-10-CM | POA: Diagnosis not present

## 2019-12-24 MED ORDER — METOPROLOL SUCCINATE ER 50 MG PO TB24: 25.0000 mg | ORAL_TABLET | Freq: Every day | ORAL | 0 refills | Status: DC

## 2019-12-24 NOTE — Assessment & Plan Note (Addendum)
On palliative treatment with Keytruda. Following with oncologist.

## 2019-12-24 NOTE — Assessment & Plan Note (Signed)
Rhythm control. Because of bradycardia metoprolol succinate was decreased from 50 mg to 25 mg. Continue Xarelto 20 mg daily. Following with cardiologist.

## 2019-12-24 NOTE — Assessment & Plan Note (Signed)
BP adequately controlled. Because bradycardia metoprolol succinate was decreased from 50 mg to 25 mg. Continue losartan 25 mg daily. Instructed to monitor BP regularly. Continue low-salt diet.

## 2019-12-24 NOTE — Patient Instructions (Addendum)
A few things to remember from today's visit:   Routine general medical examination at a health care facility  If you need refills please call your pharmacy. Do not use My Chart to request refills or for acute issues that need immediate attention.   A few tips:  -As we age balance is not as good as it was, so there is a higher risks for falls. Please remove small rugs and furniture that is "in your way" and could increase the risk of falls. Stretching exercises may help with fall prevention: Yoga and Tai Chi are some examples. Low impact exercise is better, so you are not very achy the next day.  -Sun screen and avoidance of direct sun light recommended. Caution with dehydration, if working outdoors be sure to drink enough fluids.  - Some medications are not safe as we age, increases the risk of side effects and can potentially interact with other medication you are also taken;  including some of over the counter medications. Be sure to let me know when you start a new medication even if it is a dietary/vitamin supplement.   -Healthy diet low in red meet/animal fat and sugar + regular physical activity is recommended.    Decreased Metoprolol from 50 mg to 25 mg, 1/2 tab. Monitor blood pressure and heart rate. Keep appt with cardiologist next month.    Please be sure medication list is accurate. If a new problem present, please set up appointment sooner than planned today.

## 2019-12-24 NOTE — Progress Notes (Signed)
HPI: [name] is a 74 y.o. female, who is here today for her routine physical.  Last CPE: 11/13/2018.  Regular exercise 3 or more time per week: She is not exercising regularly but she is active with chores. Following a healthy diet: Yes. She has gained some weight, has not made significant dietary changes. She stopped smoking a couple months ago.  She lives with her husband.  Chronic medical problems: HTN, paroxysmal atrial fib, HLD, CAD,osteoporosis, ovarian cancer, and peripheral neuropathy among some.  Immunization History  Administered Date(s) Administered  . PFIZER SARS-COV-2 Vaccination 06/15/2019, 07/08/2019  . Pneumococcal Conjugate-13 11/03/2017  . Pneumococcal Polysaccharide-23 09/10/2009  . Td 10/03/2001  . Zoster 08/13/2013  Mammogram: 04/03/19 Bi-Rads 4. Colonoscopy: 10/18/12. DEXA: 05/2016. Hep C screening: 10/2016 NR.  She has some concerns today. Bilateral CP, under breast. Her oncologist ordered chest and abdominal CT.  LUE edema, [place] was also done. Constant LUE edema. She has not noted erythema or cyanosis. According to patient, he was thought to be lymphedema. She wonders if she needs any treatment. Problem is not causing pain.  She had labs recently at her oncologist office. Hyperlipidemia: Currently she is on simvastatin 20 mg daily.  Chronic pain: Currently she is on hydromorphone 2 mg daily as needed. Abdominal and lower chest pain. Medication was started by former oncologist.  Chemotherapy-induced neuropathy: Currently she is on gabapentin 300 mg twice daily.  She is following with Dr.Katragadda.  Hypertension: Currently she is on losartan 25 mg daily and metoprolol succinate 50 mg daily. Noted bradycardia. She has had problem before, she has refused to decrease beta-blocker. Occasionally she feels of her balance. Atrial fibrillation on Xarelto 20 mg daily. Negative for exertional CP, dyspnea, or diaphoresis. Negative for CP,  dyspnea, palpitations, diaphoresis, orthopnea, PND, or edema. She follows with cardiologist annually, next appointment in 01/2020.  Hypothyroidism: Currently she is on levothyroxine 75 mcg daily.  Lab Results  Component Value Date   TSH 3.533 12/09/2019   Vaginal atrophy/frequent UTI: Probably has been well controlled since she started estradiol vaginal cream, initially prescribed by her gynecologist but she would like for me to continue refilling Rx when she needs it. Because of cost she is using cream once per week.  Review of Systems  Constitutional: Positive for fatigue. Negative for activity change, appetite change and fever.  HENT: Negative for mouth sores, nosebleeds and sore throat.   Eyes: Negative for redness and visual disturbance.  Respiratory: Negative for cough and wheezing.   Cardiovascular: Negative for palpitations and leg swelling.  Gastrointestinal: Positive for abdominal pain. Negative for nausea and vomiting.       Negative for changes in bowel habits.  Endocrine: Negative for cold intolerance, heat intolerance, polydipsia, polyphagia and polyuria.  Genitourinary: Negative for decreased urine volume, dysuria and hematuria.  Musculoskeletal: Negative for gait problem and myalgias.  Allergic/Immunologic: Positive for environmental allergies.  Neurological: Negative for syncope, weakness and headaches.  Psychiatric/Behavioral: Negative for confusion. The patient is nervous/anxious.   All other systems reviewed and are negative.  Current Outpatient Medications on File Prior to Visit  Medication Sig Dispense Refill  . ALPRAZolam (XANAX) 0.5 MG tablet TAKE 1/2 TO 1 (ONE-HALF TO ONE) TABLET BY MOUTH ONCE DAILY AS NEEDED FOR ANXIETY 30 tablet 3  . alum & mag hydroxide-simeth (MAALOX/MYLANTA) 200-200-20 MG/5ML suspension Take 30 mLs by mouth every 6 (six) hours as needed for indigestion or heartburn. 355 mL 0  . Artificial Saliva (SALIVASURE) LOZG Use as directed  1 lozenge  in the mouth or throat every 4 (four) hours as needed. 90 lozenge 0  . Calcium Carbonate (CALTRATE 600) 1500 MG TABS Take 600 mg of elemental calcium by mouth daily.     . cholecalciferol (VITAMIN D) 1000 UNITS tablet Take 1,000 Units by mouth daily.     Marland Kitchen estradiol (ESTRACE) 0.1 MG/GM vaginal cream Place 1 Applicatorful vaginally 3 (three) times a week. 42.5 g 12  . fluticasone (CUTIVATE) 0.05 % cream APPLY CREAM TOPICALLY UP TO TWICE DAILY AS NEEDED FOR RASH    . fluticasone (FLONASE) 50 MCG/ACT nasal spray Place 2 sprays into both nostrils daily. 48 g 2  . gabapentin (NEURONTIN) 300 MG capsule Take 1 capsule (300 mg total) by mouth 2 (two) times daily. 60 capsule 3  . HYDROmorphone (DILAUDID) 2 MG tablet Take 1 tablet (2 mg total) by mouth daily as needed for severe pain. 30 tablet 0  . lactose free nutrition (BOOST PLUS) LIQD Take 237 mLs by mouth daily. 10 Can 0  . levothyroxine (SYNTHROID) 75 MCG tablet Take 1 tablet (75 mcg total) by mouth daily. 90 tablet 3  . losartan (COZAAR) 25 MG tablet Take 1 tablet (25 mg total) by mouth daily. 30 tablet 2  . Magnesium 400 MG TABS Take 250 mg by mouth daily.    . Menthol-Methyl Salicylate (MUSCLE RUB) 10-15 % CREA Apply 1 application topically as needed for muscle pain.    . Multiple Vitamins-Minerals (MULTIVITAMIN WITH MINERALS) tablet Take 1 tablet by mouth daily.      . Omega-3 Fatty Acids (FISH OIL) 1000 MG CAPS Take 2,000 mg by mouth daily.     Marland Kitchen omeprazole (PRILOSEC) 40 MG capsule Take 1 capsule (40 mg total) by mouth daily. 90 capsule 3  . Pembrolizumab (KEYTRUDA IV) Inject 200 mg into the vein every 21 ( twenty-one) days.    . polyethylene glycol (MIRALAX / GLYCOLAX) packet Take 17 g by mouth daily. 14 each 0  . senna (SENOKOT) 8.6 MG TABS tablet Take 1 tablet (8.6 mg total) by mouth at bedtime as needed for mild constipation. 120 each 0  . simvastatin (ZOCOR) 20 MG tablet Take 1 tablet (20 mg total) by mouth at bedtime. 90 tablet 1  .  tretinoin (RETIN-A) 0.025 % cream APPLY TOPICALLY TO FACE AT BEDTIME AS NEEDED    . vitamin B-12 (CYANOCOBALAMIN) 1000 MCG tablet Take 1,000 mcg by mouth daily.    . vitamin C (ASCORBIC ACID) 500 MG tablet Take 1,000 mg by mouth daily.    . vitamin E 45 MG (100 UNITS) capsule Take by mouth daily.    [name] 20 MG TABS tablet TAKE 1 TABLET BY MOUTH ONCE DAILY WITH SUPPER 90 tablet 2  . zinc gluconate 50 MG tablet Take 50 mg by mouth daily.     No current facility-administered medications on file prior to visit.   Past Medical History:  Diagnosis Date  . Allergy    SEASONAL  . Anxiety   . Cataract    BILATERAL  . Dysrhythmia   . Family history of lung cancer   . Family history of prostate cancer   . Family history of prostate cancer   . Family history of uterine cancer   . Fibromyalgia   . GERD (gastroesophageal reflux disease)   . H/O echocardiogram 04/28/08   EF>55% trace mitral regurgitation, No significant valvular pathology  . Hemorrhoids    internal  . History of stress test 02/08/2011  Normal Myocardial perfusion study, this is a low risk scan, No prior study available for comparison  . Hyperlipidemia   . Hypertension    hx of  . Hypothyroidism   . MVP (mitral valve prolapse)    mild and MR  . OA (osteoarthritis)   . Osteoporosis   . ovarian ca dx'd 12/2017   ovarian cancer  . Paroxysmal A-fib ([place])   . Sleep apnea    does not use prescribed CPAP  does not tolerate  . Thyroid disease    HYPO  . Varicosities of leg   . Vitamin D deficiency     Past Surgical History:  Procedure Laterality Date  . ABDOMINAL HYSTERECTOMY  1989  . APPENDECTOMY  1989   with hysterectomy  . BILATERAL SALPINGECTOMY N/A 03/27/2018   Procedure: EXPLORATORY LAPARATOMY, BILATERAL SALPINGOOPHERECTOMY;  Surgeon: Everitt Amber, MD;  Location: [facility];  Service: Gynecology;  Laterality: N/A;  . BREAST EXCISIONAL BIOPSY Left 1995   Benign  . COLONOSCOPY  11-20-2000  . DEBULKING N/A  03/27/2018   Procedure: RADICAL TUMOR DEBULKING;  Surgeon: Everitt Amber, MD;  Location: [facility];  Service: Gynecology;  Laterality: N/A;  . IR IMAGING GUIDED PORT INSERTION  01/17/2018  . NOSE SURGERY  1990  . OMENTECTOMY N/A 03/27/2018   Procedure: OMENTECTOMY;  Surgeon: Everitt Amber, MD;  Location: [facility];  Service: Gynecology;  Laterality: N/A;    Allergies  Allergen Reactions  . Tramadol Hcl     jittery    Family History  Problem Relation Age of Onset  . Diabetes Daughter   . Psoriasis Daughter   . Heart defect Daughter   . Diabetes Mother   . Hypertension Mother   . Heart disease Mother   . Stroke Maternal Grandmother   . Heart attack Brother   . Heart attack Brother   . Prostatitis Brother   . Lung cancer Niece   . Cancer Paternal Aunt        type of cancer unk  . Prostate cancer Paternal Uncle   . Uterine cancer Cousin        dx under 21  . Colon cancer Neg Hx     Social History   Socioeconomic History  . Marital status: Married    Spouse name: Not on file  . Number of children: 3  . Years of education: Not on file  . Highest education level: Not on file  Occupational History  . Occupation: retired  Tobacco Use  . Smoking status: Former Smoker    Years: 10.00    Types: Cigars    Quit date: 10/24/2019    Years since quitting: 0.1  . Smokeless tobacco: Never Used  . Tobacco comment: not daily  [place] cigars 1-2   Vaping Use  . Vaping Use: Never used  Substance and Sexual Activity  . Alcohol use: No    Alcohol/week: 0.0 standard drinks  . Drug use: No  . Sexual activity: Not Currently    Comment: 1st intercourse 17yo-1 partner  Other Topics Concern  . Not on file  Social History Narrative  . Not on file   Social Determinants of Health   Financial Resource Strain: Low Risk   . Difficulty of Paying Living Expenses: Not hard at all  Food Insecurity: No Food Insecurity  . Worried About Charity fundraiser in the Last Year: Never true  . Ran Out of  Food in the Last Year: Never true  Transportation Needs: No Transportation Needs  .  Lack of Transportation (Medical): No  . Lack of Transportation (Non-Medical): No  Physical Activity: Inactive  . Days of Exercise per Week: 0 days  . Minutes of Exercise per Session: 0 min  Stress: Stress Concern Present  . Feeling of Stress : To some extent  Social Connections: Moderately Isolated  . Frequency of Communication with Friends and Family: More than three times a week  . Frequency of Social Gatherings with Friends and Family: Three times a week  . Attends Religious Services: Never  . Active Member of Clubs or Organizations: No  . Attends Archivist Meetings: Never  . Marital Status: Married   Vitals:   12/24/19 1216  BP: 128/70  Pulse: (!) 47  Resp: 16  Temp: 97.9 F (36.6 C)  SpO2: 99%   Body mass index is 28.24 kg/m.   Wt Readings from Last 3 Encounters:  12/24/19 154 lb 6.4 oz (70 kg)  12/09/19 153 lb 8 oz (69.6 kg)  11/26/19 152 lb (68.9 kg)   Physical Exam Vitals and nursing note reviewed.  Constitutional:      General: She is not in acute distress.    Appearance: She is well-developed.  HENT:     Head: Normocephalic and atraumatic.     Right Ear: Hearing, tympanic membrane, ear canal and external ear normal.     Left Ear: Hearing, tympanic membrane, ear canal and external ear normal.     Mouth/Throat:     Mouth: Oropharynx is clear and moist and mucous membranes are normal. Mucous membranes are moist.     Pharynx: Oropharynx is clear. Uvula midline.  Eyes:     Extraocular Movements: EOM normal.     Conjunctiva/sclera: Conjunctivae normal.     Pupils: Pupils are equal, round, and reactive to light.  Neck:     Thyroid: No thyromegaly.     Trachea: No tracheal deviation.  Cardiovascular:     Rate and Rhythm: Regular rhythm. Bradycardia present.     Pulses:          Dorsalis pedis pulses are 2+ on the right side and 2+ on the left side.     Heart  sounds: No murmur heard.     Comments: Mild LUE non pitting edema. There is no erythema,induration,or tenderness. Pulmonary:     Effort: Pulmonary effort is normal. No respiratory distress.     Breath sounds: Normal breath sounds.  Abdominal:     Palpations: Abdomen is soft. There is no hepatomegaly or mass.     Tenderness: There is no abdominal tenderness.  Musculoskeletal:        General: No edema.     Comments: No signs of synovitis appreciated.  Lymphadenopathy:     Cervical: No cervical adenopathy.  Skin:    General: Skin is warm.     Findings: No erythema or rash.  Neurological:     General: No focal deficit present.     Mental Status: She is alert and oriented to person, place, and time.     Cranial Nerves: No cranial nerve deficit.     Coordination: Coordination normal.     Gait: Gait normal.     Deep Tendon Reflexes: Strength normal.     Reflex Scores:      Bicep reflexes are 2+ on the right side and 2+ on the left side.      Patellar reflexes are 2+ on the right side and 2+ on the left side. Psychiatric:  Mood and Affect: Mood is anxious. Mood is not depressed.     Comments: Well groomed, good eye contact.    ASSESSMENT AND PLAN:  [name] was here today annual physical examination.  Diagnoses and all orders for this visit:  Routine general medical examination at a health care facility We discussed the importance of regular physical activity and healthy diet for prevention of chronic illness and/or complications. Preventive guidelines reviewed. Vaccination up-to-date  Ca++ and vit D supplementation to continue. Next CPE in a year.  Bradycardia We discussed possible etiologies, most likely related to beta-blocker. She agrees with decreasing dose of metoprolol. Instructed about warning signs.  Lymphedema of left upper extremity Educated about Dx,prognosis,and treatment options. PT and arm sleeve can be considered, she doe snot feel like  it is needed at this time.  Anxiety Problem is stable. Continue alprazolam 0.5 mg daily as needed.  Essential hypertension, benign BP adequately controlled. Because bradycardia metoprolol succinate was decreased from 50 mg to 25 mg. Continue losartan 25 mg daily. Instructed to monitor BP regularly. Continue low-salt diet.  Chronic abdominal pain Continue hydromorphone 2 mg daily as needed, we discussed some side effects. Medication contract signed today and PDMP reviewed.  Atrophic vaginitis Topical estradiol once per week is helping, so no changes in current management.  PAF (paroxysmal atrial fibrillation) (HCC) Rhythm control. Because of bradycardia metoprolol succinate was decreased from 50 mg to 25 mg. Continue Xarelto 20 mg daily. Following with cardiologist.   Right ovarian epithelial cancer [facility]) On palliative treatment with Keytruda. Following with oncologist.  Return in 4 months (on 04/23/2020) for Chronic pain and anxiety..  Haley G. Martinique, MD  [facility]. [place] office.    A few things to remember from today's visit:   Routine general medical examination at a health care facility  If you need refills please call your pharmacy. Do not use My Chart to request refills or for acute issues that need immediate attention.   A few tips:  -As we age balance is not as good as it was, so there is a higher risks for falls. Please remove small rugs and furniture that is "in your way" and could increase the risk of falls. Stretching exercises may help with fall prevention: Yoga and Tai Chi are some examples. Low impact exercise is better, so you are not very achy the next day.  -Sun screen and avoidance of direct sun light recommended. Caution with dehydration, if working outdoors be sure to drink enough fluids.  - Some medications are not safe as we age, increases the risk of side effects and can potentially interact with other medication you are  also taken;  including some of over the counter medications. Be sure to let me know when you start a new medication even if it is a dietary/vitamin supplement.   -Healthy diet low in red meet/animal fat and sugar + regular physical activity is recommended.    Decreased Metoprolol from 50 mg to 25 mg, 1/2 tab. Monitor blood pressure and heart rate. Keep appt with cardiologist next month.    Please be sure medication list is accurate. If a new problem present, please set up appointment sooner than planned today.   A few things to remember from today's visit:   Routine general medical examination at a health care facility  If you need refills please call your pharmacy. Do not use My Chart to request refills or for acute issues that need immediate attention.  A few tips:  -As we age balance is not as good as it was, so there is a higher risks for falls. Please remove small rugs and furniture that is "in your way" and could increase the risk of falls. Stretching exercises may help with fall prevention: Yoga and Tai Chi are some examples. Low impact exercise is better, so you are not very achy the next day.  -Sun screen and avoidance of direct sun light recommended. Caution with dehydration, if working outdoors be sure to drink enough fluids.  - Some medications are not safe as we age, increases the risk of side effects and can potentially interact with other medication you are also taken;  including some of over the counter medications. Be sure to let me know when you start a new medication even if it is a dietary/vitamin supplement.   -Healthy diet low in red meet/animal fat and sugar + regular physical activity is recommended.    Decreased Metoprolol from 50 mg to 25 mg, 1/2 tab. Monitor blood pressure and heart rate. Keep appt with cardiologist next month.    Please be sure medication list is accurate. If a new problem present, please set up appointment sooner than planned  today.

## 2019-12-24 NOTE — Assessment & Plan Note (Signed)
Continue hydromorphone 2 mg daily as needed, we discussed some side effects. Medication contract signed today and PDMP reviewed.

## 2019-12-24 NOTE — Assessment & Plan Note (Signed)
Topical estradiol once per week is helping, so no changes in current management.

## 2019-12-24 NOTE — Assessment & Plan Note (Signed)
Problem is stable. Continue alprazolam 0.5 mg daily as needed.

## 2019-12-25 ENCOUNTER — Telehealth: Payer: Self-pay

## 2019-12-25 DIAGNOSIS — I10 Essential (primary) hypertension: Secondary | ICD-10-CM

## 2019-12-25 DIAGNOSIS — T451X5A Adverse effect of antineoplastic and immunosuppressive drugs, initial encounter: Secondary | ICD-10-CM

## 2019-12-25 NOTE — Telephone Encounter (Signed)
-----   Message from Viona Gilmore, [facility] sent at 12/25/2019  8:33 AM EST ----- Regarding: CCM referral Hi again,  Can you please place a CCM referral for Ms. [facility]?  Thank you, Maddie

## 2019-12-27 ENCOUNTER — Telehealth: Payer: Self-pay | Admitting: Pharmacist

## 2019-12-27 NOTE — Chronic Care Management (AMB) (Signed)
Chronic Care Management Pharmacy Assistant   Name: Haley [name]  MRN: [id number] DOB: [birth year]  Reason for Encounter: Reason for Encounter: Medication Review/Initial Questions for Pharmacist visit on 12/30/2019  Patient Questions:  1. Have you seen any other providers since your last visit? No 2. Any changes in your medications or health? No 3. Any side effects from any medications? No 4. Do you have any symptoms or problems not managed by your medications? No 5. Any concerns about your health right now?  No 6. Has your provider asked that you check blood pressure, blood sugar, or follow a special diet at home? No 7. Do you get any type of exercise regularly? No  8. Can you think of a goal you would like to reach for your health?  . She states she would like to loss a few pounds 9. Do you have any problems getting your medications? No 10. Is there anything that you would like to discuss during the appointment? No  PCP : Martinique, Edessa G, MD  Allergies:   Allergies  Allergen Reactions  . Tramadol Hcl     jittery    Medications: Outpatient Encounter Medications as of 12/27/2019  Medication Sig  . ALPRAZolam (XANAX) 0.5 MG tablet TAKE 1/2 TO 1 (ONE-HALF TO ONE) TABLET BY MOUTH ONCE DAILY AS NEEDED FOR ANXIETY  . alum & mag hydroxide-simeth (MAALOX/MYLANTA) 200-200-20 MG/5ML suspension Take 30 mLs by mouth every 6 (six) hours as needed for indigestion or heartburn.  . Artificial Saliva (SALIVASURE) LOZG Use as directed 1 lozenge in the mouth or throat every 4 (four) hours as needed.  . Calcium Carbonate (CALTRATE 600) 1500 MG TABS Take 600 mg of elemental calcium by mouth daily.   . cholecalciferol (VITAMIN D) 1000 UNITS tablet Take 1,000 Units by mouth daily.   Marland Kitchen estradiol (ESTRACE) 0.1 MG/GM vaginal cream Place 1 Applicatorful vaginally 3 (three) times a week.  . fluticasone (CUTIVATE) 0.05 % cream APPLY CREAM TOPICALLY UP TO TWICE DAILY AS NEEDED FOR RASH  . fluticasone  (FLONASE) 50 MCG/ACT nasal spray Place 2 sprays into both nostrils daily.  Marland Kitchen gabapentin (NEURONTIN) 300 MG capsule Take 1 capsule (300 mg total) by mouth 2 (two) times daily.  Marland Kitchen HYDROmorphone (DILAUDID) 2 MG tablet Take 1 tablet (2 mg total) by mouth daily as needed for severe pain.  Marland Kitchen lactose free nutrition (BOOST PLUS) LIQD Take 237 mLs by mouth daily.  Marland Kitchen levothyroxine (SYNTHROID) 75 MCG tablet Take 1 tablet (75 mcg total) by mouth daily.  Marland Kitchen losartan (COZAAR) 25 MG tablet Take 1 tablet (25 mg total) by mouth daily.  . Magnesium 400 MG TABS Take 250 mg by mouth daily.  . Menthol-Methyl Salicylate (MUSCLE RUB) 10-15 % CREA Apply 1 application topically as needed for muscle pain.  . metoprolol succinate (TOPROL-XL) 50 MG 24 hr tablet Take 0.5 tablets (25 mg total) by mouth daily. Take with or immediately following a meal.  . Multiple Vitamins-Minerals (MULTIVITAMIN WITH MINERALS) tablet Take 1 tablet by mouth daily.    . Omega-3 Fatty Acids (FISH OIL) 1000 MG CAPS Take 2,000 mg by mouth daily.   Marland Kitchen omeprazole (PRILOSEC) 40 MG capsule Take 1 capsule (40 mg total) by mouth daily.  . Pembrolizumab (KEYTRUDA IV) Inject 200 mg into the vein every 21 ( twenty-one) days.  . polyethylene glycol (MIRALAX / GLYCOLAX) packet Take 17 [name] by mouth daily.  Marland Kitchen senna (SENOKOT) 8.6 MG TABS tablet Take 1 tablet (8.6 mg total) by  mouth at bedtime as needed for mild constipation.  . simvastatin (ZOCOR) 20 MG tablet Take 1 tablet (20 mg total) by mouth at bedtime.  . tretinoin (RETIN-A) 0.025 % cream APPLY TOPICALLY TO FACE AT BEDTIME AS NEEDED  . vitamin B-12 (CYANOCOBALAMIN) 1000 MCG tablet Take 1,000 mcg by mouth daily.  . vitamin C (ASCORBIC ACID) 500 MG tablet Take 1,000 mg by mouth daily.  . vitamin E 45 MG (100 UNITS) capsule Take by mouth daily.  [name] 20 MG TABS tablet TAKE 1 TABLET BY MOUTH ONCE DAILY WITH SUPPER  . zinc gluconate 50 MG tablet Take 50 mg by mouth daily.   No facility-administered  encounter medications on file as of 12/27/2019.    Current Diagnosis: Patient Active Problem List   Diagnosis Date Noted  . 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 End of life care 06/25/2019  . Skin lesion of right lower extremity 06/03/2019  . Lymphadenopathy, axillary 03/26/2019  . Atrophic vaginitis 01/28/2019  . B12 deficiency 12/13/2018  . Mucositis due to antineoplastic therapy 12/03/2018  . Pain, dental 11/20/2018  . Rectal pain 09/10/2018  . Encounter for antineoplastic chemotherapy 07/05/2018  . Goals of care, counseling/discussion 07/03/2018  . Pancytopenia, acquired ([place]) 06/01/2018  . Genetic testing 05/08/2018  . Peripheral neuropathy due to chemotherapy ([place]) 04/20/2018  . Pre-operative cardiovascular examination 03/08/2018  . Chest pain with moderate risk of acute coronary syndrome 03/08/2018  . Family history of prostate cancer   . Family history of uterine cancer   . Family history of lung cancer   . Chronic anticoagulation 02/19/2018  . Lower extremity pain 02/05/2018  . Encounter for imaging study to confirm nasogastric (NG) tube placement   . Right ovarian epithelial cancer ([place]) 01/16/2018  . Partial small bowel obstruction ([place]) 01/16/2018  . Chronic abdominal pain 01/15/2018  . Carcinomatosis ([place]) 01/11/2018  . Essential hypertension, benign 09/13/2017  . Bilateral impacted cerumen 08/16/2017  . OSA (obstructive sleep apnea) 08/16/2017  . Seasonal allergic rhinitis 08/16/2017  . Osteoporosis 01/13/2014  . Urgency incontinence 01/13/2014  . PAF (paroxysmal atrial fibrillation) ([place]) 03/31/2013  . Tobacco use disorder 08/06/2012  . Dyslipidemia 06/05/2012  . Anxiety 06/05/2012  . GERD 06/03/2008  . Hypothyroidism 07/07/2006    Goals Addressed   None     Follow-Up:  Pharmacist Review   Maia Breslow, [place] Assistant [phone]

## 2019-12-30 ENCOUNTER — Telehealth: Payer: PPO

## 2019-12-30 NOTE — Chronic Care Management (AMB) (Deleted)
Chronic Care Management Pharmacy  Name: Cherrie Franca [facility]  MRN: [id number] DOB: December 27, 1945  Initial Planning Appointment: completed 12/27/19  Initial Questions: 1. Have you seen any other providers since your last visit? n/a 2. Any changes in your medicines or health? No   Chief Complaint/ HPI  Gal[place]o,  74 y.o. , female presents for their Initial CCM visit with the clinical pharmacist via telephone due to COVID-19 Pandemic.  PCP : Martinique, Tatyanna G, MD  Their chronic conditions include: {CHL AMB CHRONIC MEDICAL CONDITIONS:8163466074}  Office Visits: -12/24/19 Kandie Martinique, MD: Patient presented for annual exam. Decreased metoprolol to 25 mg daily due to bradycardia.  -11/26/19 Charlott Rakes, LPN: Patient presented for medicare annual wellness exam.  -09/11/19 Acire Martinique, MD: Patient presented for ankle swelling.  Consult Visit: -12/09/19 Derek Jack, MD (hematology): Patient presented for follow up for right ovarian epithelial cancer.  -11/18/19 Derek Jack, MD (hematology): Patient presented for follow up for right ovarian epithelial cancer.  -10/17/19 Derek Jack, MD (hematology): Patient presented for follow up for right ovarian epithelial cancer.  -09/26/19 Shelva Majestic, MD (Cardiology): Patient presented for Afib follow up. Prescribed losartan 25 mg daily.   -09/09/19 Derek Jack, MD (hematology): Patient presented for evaluation for right ovarian epithelial cancer.  Medications: Outpatient Encounter Medications as of 12/30/2019  Medication Sig  . ALPRAZolam (XANAX) 0.5 MG tablet TAKE 1/2 TO 1 (ONE-HALF TO ONE) TABLET BY MOUTH ONCE DAILY AS NEEDED FOR ANXIETY  . alum & mag hydroxide-simeth (MAALOX/MYLANTA) 200-200-20 MG/5ML suspension Take 30 mLs by mouth every 6 (six) hours as needed for indigestion or heartburn.  . Artificial Saliva (SALIVASURE) LOZG Use as directed 1 lozenge in the mouth or throat every 4 (four) hours as  needed.  . Calcium Carbonate (CALTRATE 600) 1500 MG TABS Take 600 mg of elemental calcium by mouth daily.   . cholecalciferol (VITAMIN D) 1000 UNITS tablet Take 1,000 Units by mouth daily.   Marland Kitchen estradiol (ESTRACE) 0.1 MG/GM vaginal cream Place 1 Applicatorful vaginally 3 (three) times a week.  . fluticasone (CUTIVATE) 0.05 % cream APPLY CREAM TOPICALLY UP TO TWICE DAILY AS NEEDED FOR RASH  . fluticasone (FLONASE) 50 MCG/ACT nasal spray Place 2 sprays into both nostrils daily.  Marland Kitchen gabapentin (NEURONTIN) 300 MG capsule Take 1 capsule (300 mg total) by mouth 2 (two) times daily.  Marland Kitchen HYDROmorphone (DILAUDID) 2 MG tablet Take 1 tablet (2 mg total) by mouth daily as needed for severe pain.  Marland Kitchen lactose free nutrition (BOOST PLUS) LIQD Take 237 mLs by mouth daily.  Marland Kitchen levothyroxine (SYNTHROID) 75 MCG tablet Take 1 tablet (75 mcg total) by mouth daily.  Marland Kitchen losartan (COZAAR) 25 MG tablet Take 1 tablet (25 mg total) by mouth daily.  . Magnesium 400 MG TABS Take 250 mg by mouth daily.  . Menthol-Methyl Salicylate (MUSCLE RUB) 10-15 % CREA Apply 1 application topically as needed for muscle pain.  . metoprolol succinate (TOPROL-XL) 50 MG 24 hr tablet Take 0.5 tablets (25 mg total) by mouth daily. Take with or immediately following a meal.  . Multiple Vitamins-Minerals (MULTIVITAMIN WITH MINERALS) tablet Take 1 tablet by mouth daily.    . Omega-3 Fatty Acids (FISH OIL) 1000 MG CAPS Take 2,000 mg by mouth daily.   Marland Kitchen omeprazole (PRILOSEC) 40 MG capsule Take 1 capsule (40 mg total) by mouth daily.  . Pembrolizumab (KEYTRUDA IV) Inject 200 mg into the vein every 21 ( twenty-one) days.  . polyethylene glycol (MIRALAX / GLYCOLAX) packet Take  17 g by mouth daily.  Marland Kitchen senna (SENOKOT) 8.6 MG TABS tablet Take 1 tablet (8.6 mg total) by mouth at bedtime as needed for mild constipation.  . simvastatin (ZOCOR) 20 MG tablet Take 1 tablet (20 mg total) by mouth at bedtime.  . tretinoin (RETIN-A) 0.025 % cream APPLY TOPICALLY TO  FACE AT BEDTIME AS NEEDED  . vitamin B-12 (CYANOCOBALAMIN) 1000 MCG tablet Take 1,000 mcg by mouth daily.  . vitamin C (ASCORBIC ACID) 500 MG tablet Take 1,000 mg by mouth daily.  . vitamin E 45 MG (100 UNITS) capsule Take by mouth daily.  [name] 20 MG TABS tablet TAKE 1 TABLET BY MOUTH ONCE DAILY WITH SUPPER  . zinc gluconate 50 MG tablet Take 50 mg by mouth daily.   No facility-administered encounter medications on file as of 12/30/2019.     Current Diagnosis/Assessment:  Goals Addressed   None    Hypertension   BP goal is:  {CHL HP UPSTREAM Pharmacist BP ranges:[phone]}  Office blood pressures are  BP Readings from Last 3 Encounters:  12/24/19 128/70  12/09/19 (!) 120/58  12/09/19 136/75   Patient checks BP at home {CHL HP BP Monitoring Frequency:[phone]} Patient home BP readings are ranging: ***  Patient has failed these meds in the past: *** Patient is currently {CHL Controlled/Uncontrolled:8701758926} on the following medications:  Marland Kitchen Metoprolol succinate 50 mg 1/2 tablet daily . Losartan 25 mg 1 tablet daily - still have?  We discussed {CHL HP Upstream Pharmacy discussion:[phone]}  Plan  Continue {CHL HP Upstream Pharmacy Plans:[phone]}   AFIB   Patient is currently rate controlled. Office heart rates are  Pulse Readings from Last 3 Encounters:  12/24/19 (!) 47  12/09/19 (!) 50  12/09/19 70    CHA2DS2-VASc Score =   4 The patient's score is based upon: age, sex, HTN diagnosis   Patient has failed these meds in past: *** Patient is currently {CHL Controlled/Uncontrolled:8701758926} on the following medications:  Marland Kitchen Metoprolol succinate 50 mg 1/2 tablet daily . Xarelto 20 mg 1 tablet with supper  We discussed:  monitoring HR along with BP; Monitoring for signs of bleeding such as unexplained and excessive bleeding from a cut or injury, easy or excessive bruising, blood in urine or stools, and nosebleeds without a known cause  Plan Cost  of xarelto? Continue {CHL HP Upstream Pharmacy Plans:[phone]}    Hyperlipidemia   LDL goal < 100  Last lipids Lab Results  Component Value Date   CHOL 186 11/13/2019   HDL 71 11/13/2019   LDLCALC 90 11/13/2019   LDLDIRECT 84.3 12/16/2010   TRIG 147 11/13/2019   CHOLHDL 2.6 11/13/2019   Hepatic Function Latest Ref Rng & Units 12/09/2019 11/18/2019 11/13/2019  Total Protein 6.5 - 8.1 g/dL 7.5 6.8 7.6  Albumin 3.5 - 5.0 g/dL 3.6 3.5 4.1  AST 15 - 41 U/L $Remo'26 22 22  'fmQKN$ ALT 0 - 44 U/L $Remo'19 19 18  'GxxdG$ Alk Phosphatase 38 - 126 U/L 74 64 104  Total Bilirubin 0.3 - 1.2 mg/dL 0.6 0.5 0.4  Bilirubin, Direct 0.0 - 0.3 mg/dL - - -     The 10-year ASCVD risk score Mikey Bussing DC Jr., et al., 2013) is: 29.3%   Values used to calculate the score:     Age: 47 years     Sex: Female     Is Non-Hispanic African American: No     Diabetic: No     Tobacco smoker: Yes     Systolic Blood  Pressure: 128 mmHg     Is BP treated: Yes     HDL Cholesterol: 71 mg/dL     Total Cholesterol: 186 mg/dL   Patient has failed these meds in past: *** Patient is currently controlled on the following medications:  . Simvastatin 20 mg 1 tablet at bedtime . Omega 3 fish oil 1,000 mg 2 capsules daily  We discussed:  diet and exercise extensively  Plan  Continue {CHL HP Upstream Pharmacy HYWVP:[id number]}   GERD   Patient has failed these meds in past: *** Patient is currently {CHL Controlled/Uncontrolled:[phone]} on the following medications:  . Omeprazole 40 mg capsule 1 capsule daily . Mylanta PRN  We discussed:  Non-pharmacologic management of symptoms such as elevating the head of your bed, avoiding eating 2-3 hours before bed, avoiding triggering foods such as acidic, spicy, or fatty foods, eating smaller meals, and wearing clothes that are loose around the waist  Plan  Continue {CHL HP Upstream Pharmacy Plans:[phone]}   Hypothyroidism   Lab Results  Component Value Date/Time   TSH 3.533 12/09/2019  08:37 AM   TSH 2.823 11/18/2019 08:57 AM   TSH 2.06 11/03/2017 10:19 AM   TSH 0.59 03/21/2017 03:21 PM    Patient has failed these meds in past: *** Patient is currently {CHL Controlled/Uncontrolled:[phone]} on the following medications:  . Levothyroxine 75 mcg 1 tablet daily  We discussed:  {CHL HP Upstream Pharmacy discussion:[phone]}  Plan  Continue {CHL HP Upstream Pharmacy Plans:[phone]}   Anxiety   Patient has failed these meds in past: *** Patient is currently {CHL Controlled/Uncontrolled:[phone]} on the following medications:  . Alprazolam 0.5 mg 1 tablet once daily PRN  We discussed:  ***  Plan  Continue {CHL HP Upstream Pharmacy Plans:[phone]}   Pain   Patient has failed these meds in past: *** Patient is currently {CHL Controlled/Uncontrolled:[phone]} on the following medications:  . Hydromorphone 2 mg daily as needed for severe pain . Gabapentin  We discussed:  ***  Plan  Continue {CHL HP Upstream Pharmacy Plans:[phone]}   Osteoporosis   Last DEXA Scan: ***   T-Score femoral neck: ***  T-Score total hip: ***  T-Score lumbar spine: ***  T-Score forearm radius: ***  10-year probability of major osteoporotic fracture: ***  10-year probability of hip fracture: ***  Vit D, 25-Hydroxy  Date Value Ref Range Status  03/23/2015 35 30 - 100 ng/mL Final    Comment:    Vitamin D Status           25-OH Vitamin D        Deficiency                <20 ng/mL        Insufficiency         20 - 29 ng/mL        Optimal             > or = 30 ng/mL   For 25-OH Vitamin D testing on patients on D2-supplementation and patients for whom quantitation of D2 and D3 fractions is required, the QuestAssureD 25-OH VIT D, (D2,D3), LC/MS/MS is recommended: order code [id number] (patients > 2 yrs).      Patient {is;is not an osteoporosis candidate:23886}  Patient has failed these meds in past: *** Patient is currently {CHL  Controlled/Uncontrolled:[phone]} on the following medications:  Marland Kitchen Vitamin D 1000 units 1 tablet daily . Calcium 1500 mg 1 tablet daily  We discussed:  Recommend [phone] units of vitamin D  daily. Recommend 1200 mg of calcium daily from dietary and supplemental sources.  Plan  Continue {CHL HP Upstream Pharmacy Plans:[phone]}   Constipation   Patient has failed these meds in past: *** Patient is currently {CHL Controlled/Uncontrolled:[phone]} on the following medications:  Marland Kitchen Miralax 1 capful daily . Senna 9.6 mg 1 tablet at bedtime as needed  We discussed:  ***  Plan  Continue {CHL HP Upstream Pharmacy Plans:[phone]}   Miscellaneous/OTC   Patient has failed these meds in past: *** Patient is currently {CHL Controlled/Uncontrolled:[phone]} on the following medications:  Marland Kitchen Magnesium 250 mg 1 tablet daily . Muscle rub cream as needed . Multivitamin 1 tablet daily . Vitamin C 500 mg 2 tablets daily . Vitamin E 100 units 1 capsule daily . Zinc gluconate 50 mg 1 tablet daily . Vitamin B12 1000 mcg 1 tablet daily . Estradiol 0.1 mg/gm vaginal cream . Flonase . Boost plus  We discussed:  ***  Plan  Continue {CHL HP Upstream Pharmacy JEHUD:[id number]}   Vaccines   Reviewed and discussed patient's vaccination history.    Immunization History  Administered Date(s) Administered  . PFIZER SARS-COV-2 Vaccination 06/15/2019, 07/08/2019  . Pneumococcal Conjugate-13 11/03/2017  . Pneumococcal Polysaccharide-23 09/10/2009  . Td 10/03/2001  . Zoster 08/13/2013    Plan  Recommended patient receive *** vaccine in *** office.   Medication Management   Patient's preferred pharmacy is:  [facility] ([place]) [place], [place] [address] Phone: [phone] Fax: [phone]  [facility]) - [place], [place] [address] Phone: [phone] Fax: [employer] [address] [place] - [place] [address] [place] [address] [place] [address] Phone: [phone] Fax: [phone]  Uses pill box? {Yes or If no, why not?:20788} Pt endorses ***% compliance  We discussed: {Pharmacy options:24294}  Plan  {US Pharmacy BWGY:65993}    Follow up: *** month phone visit  Jeni Salles, PharmD [place] Pharmacist [place] [facility] [place] [phone]

## 2019-12-31 ENCOUNTER — Inpatient Hospital Stay (HOSPITAL_COMMUNITY): Payer: PPO

## 2019-12-31 ENCOUNTER — Inpatient Hospital Stay (HOSPITAL_COMMUNITY): Payer: PPO | Attending: Hematology

## 2019-12-31 ENCOUNTER — Other Ambulatory Visit: Payer: Self-pay

## 2019-12-31 ENCOUNTER — Encounter (HOSPITAL_COMMUNITY): Payer: Self-pay

## 2019-12-31 VITALS — BP 135/55 | HR 50 | Temp 96.8°F | Resp 18

## 2019-12-31 DIAGNOSIS — Z7189 Other specified counseling: Secondary | ICD-10-CM

## 2019-12-31 DIAGNOSIS — Z79899 Other long term (current) drug therapy: Secondary | ICD-10-CM | POA: Diagnosis not present

## 2019-12-31 DIAGNOSIS — C786 Secondary malignant neoplasm of retroperitoneum and peritoneum: Secondary | ICD-10-CM | POA: Diagnosis not present

## 2019-12-31 DIAGNOSIS — M79606 Pain in leg, unspecified: Secondary | ICD-10-CM

## 2019-12-31 DIAGNOSIS — Z5112 Encounter for antineoplastic immunotherapy: Secondary | ICD-10-CM | POA: Diagnosis not present

## 2019-12-31 DIAGNOSIS — C561 Malignant neoplasm of right ovary: Secondary | ICD-10-CM

## 2019-12-31 DIAGNOSIS — G8929 Other chronic pain: Secondary | ICD-10-CM

## 2019-12-31 LAB — COMPREHENSIVE METABOLIC PANEL
ALT: 17 U/L (ref 0–44)
AST: 27 U/L (ref 15–41)
Albumin: 3.5 g/dL (ref 3.5–5.0)
Alkaline Phosphatase: 67 U/L (ref 38–126)
Anion gap: 7 (ref 5–15)
BUN: 15 mg/dL (ref 8–23)
CO2: 26 mmol/L (ref 22–32)
Calcium: 8.9 mg/dL (ref 8.9–10.3)
Chloride: 103 mmol/L (ref 98–111)
Creatinine, Ser: 0.81 mg/dL (ref 0.44–1.00)
GFR, Estimated: >60 mL/min (ref >60)
Glucose, Bld: 86 mg/dL (ref 70–99)
Potassium: 3.9 mmol/L (ref 3.5–5.1)
Sodium: 136 mmol/L (ref 135–145)
Total Bilirubin: 0.5 mg/dL (ref 0.3–1.2)
Total Protein: 7.3 g/dL (ref 6.5–8.1)

## 2019-12-31 LAB — CBC WITH DIFFERENTIAL/PLATELET
Abs Immature Granulocytes: 0.04 10*3/uL (ref 0.00–0.07)
Basophils Absolute: 0 10*3/uL (ref 0.0–0.1)
Basophils Relative: 1 %
Eosinophils Absolute: 0.4 10*3/uL (ref 0.0–0.5)
Eosinophils Relative: 6 %
HCT: 33.3 % — ABNORMAL LOW (ref 36.0–46.0)
Hemoglobin: 10.5 g/dL — ABNORMAL LOW (ref 12.0–15.0)
Immature Granulocytes: 1 %
Lymphocytes Relative: 25 %
Lymphs Abs: 1.7 10*3/uL (ref 0.7–4.0)
MCH: 29.7 pg (ref 26.0–34.0)
MCHC: 31.5 g/dL (ref 30.0–36.0)
MCV: 94.3 fL (ref 80.0–100.0)
Monocytes Absolute: 0.6 10*3/uL (ref 0.1–1.0)
Monocytes Relative: 9 %
Neutro Abs: 3.8 10*3/uL (ref 1.7–7.7)
Neutrophils Relative %: 58 %
Platelets: 186 10*3/uL (ref 150–400)
RBC: 3.53 MIL/uL — ABNORMAL LOW (ref 3.87–5.11)
RDW: 14.6 % (ref 11.5–15.5)
WBC: 6.5 10*3/uL (ref 4.0–10.5)
nRBC: 0 % (ref 0.0–0.2)

## 2019-12-31 LAB — TSH: TSH: 3.311 u[IU]/mL (ref 0.350–4.500)

## 2019-12-31 MED ORDER — HEPARIN SOD (PORK) LOCK FLUSH 100 UNIT/ML IV SOLN
500.0000 [IU] | Freq: Once | INTRAVENOUS | Status: AC | PRN
  Administered 2019-12-31: 500 [IU]

## 2019-12-31 MED ORDER — SODIUM CHLORIDE 0.9 % IV SOLN
200.0000 mg | Freq: Once | INTRAVENOUS | Status: AC
  Administered 2019-12-31: 14:00:00 200 mg via INTRAVENOUS
  Filled 2019-12-31: qty 8

## 2019-12-31 MED ORDER — SODIUM CHLORIDE 0.9% FLUSH
10.0000 mL | INTRAVENOUS | Status: DC | PRN
  Administered 2019-12-31: 12:00:00 10 mL

## 2019-12-31 MED ORDER — SODIUM CHLORIDE 0.9 % IV SOLN
Freq: Once | INTRAVENOUS | Status: AC
Start: 2019-12-31 — End: 2019-12-31

## 2019-12-31 NOTE — Progress Notes (Signed)
Patient tolerated therapy with no complaints voiced.  Side effects with management reviewed with understanding verbalized.  Port site clean and dry with no bruising or swelling noted at site.  Good blood return noted before and after administration of therapy.  Band aid applied.  Patient left in satisfactory condition with VSS and no s/s of distress noted.  

## 2020-01-01 ENCOUNTER — Other Ambulatory Visit: Payer: Self-pay | Admitting: Cardiovascular Disease

## 2020-01-01 LAB — CA 125: Cancer Antigen (CA) 125: 1955 U/mL — ABNORMAL HIGH (ref 0.0–38.1)

## 2020-01-07 ENCOUNTER — Encounter: Payer: PPO | Admitting: Family Medicine

## 2020-01-09 ENCOUNTER — Ambulatory Visit: Payer: PPO | Admitting: Pharmacist

## 2020-01-09 DIAGNOSIS — I480 Paroxysmal atrial fibrillation: Secondary | ICD-10-CM

## 2020-01-09 DIAGNOSIS — I10 Essential (primary) hypertension: Secondary | ICD-10-CM

## 2020-01-09 NOTE — Chronic Care Management (AMB) (Signed)
Chronic Care Management Pharmacy  Name: Haley [name] [facility]  MRN: [id number] DOB: 02-17-1945  Initial Planning Appointment: completed 12/27/19  Initial Questions: 1. Have you seen any other providers since your last visit? n/a 2. Any changes in your medicines or health? No   Chief Complaint/ HPI  Haley [name],  74 y.o. , female presents for their Initial CCM visit with the clinical pharmacist via telephone due to COVID-19 Pandemic.  PCP : Martinique, Melina G, MD  Their chronic conditions include: HTN, AFib,  HLD, GERD,  hypothyroidism, anxiety, pain, osteoporosis, constipation  Office Visits: -12/24/19 Gearl Martinique, MD: Patient presented for annual exam. Decreased metoprolol to 25 mg daily due to bradycardia.  -11/26/19 Charlott Rakes, LPN: Patient presented for medicare annual wellness exam.  -09/11/19 Cher Martinique, MD: Patient presented for ankle swelling.  Consult Visit: -12/09/19 Derek Jack, MD (hematology): Patient presented for follow up for right ovarian epithelial cancer.  -11/18/19 Derek Jack, MD (hematology): Patient presented for follow up for right ovarian epithelial cancer.  -10/17/19 Derek Jack, MD (hematology): Patient presented for follow up for right ovarian epithelial cancer.  -09/26/19 Shelva Majestic, MD (Cardiology): Patient presented for Afib follow up. Prescribed losartan 25 mg daily.   -09/09/19 Derek Jack, MD (hematology): Patient presented for evaluation for right ovarian epithelial cancer.  Medications: Outpatient Encounter Medications as of 01/09/2020  Medication Sig  . ALPRAZolam (XANAX) 0.5 MG tablet TAKE 1/2 TO 1 (ONE-HALF TO ONE) TABLET BY MOUTH ONCE DAILY AS NEEDED FOR ANXIETY  . alum & mag hydroxide-simeth (MAALOX/MYLANTA) 200-200-20 MG/5ML suspension Take 30 mLs by mouth every 6 (six) hours as needed for indigestion or heartburn.  . calcium carbonate (OSCAL) 1500 (600 Ca) MG TABS tablet Take 600 mg of elemental  calcium by mouth daily.  . cholecalciferol (VITAMIN D) 1000 UNITS tablet Take 1,000 Units by mouth daily.  Marland Kitchen DIGESTIVE ENZYMES PO Take 1 capsule by mouth daily. (Patient not taking: Reported on 01/30/2020)  . estradiol (ESTRACE) 0.1 MG/GM vaginal cream Place 1 Applicatorful vaginally 3 (three) times a week.  . fluticasone (CUTIVATE) 0.05 % cream APPLY CREAM TOPICALLY UP TO TWICE DAILY AS NEEDED FOR RASH  . fluticasone (FLONASE) 50 MCG/ACT nasal spray Place 2 sprays into both nostrils daily.  Marland Kitchen gabapentin (NEURONTIN) 300 MG capsule Take 1 capsule (300 mg total) by mouth 2 (two) times daily. (Patient taking differently: Take 300 mg by mouth 2 (two) times daily. Taking 1 capsule at bedtime)  . HYDROmorphone (DILAUDID) 2 MG tablet Take 1 tablet (2 mg total) by mouth daily as needed for severe pain.  Marland Kitchen lactose free nutrition (BOOST PLUS) LIQD Take 237 mLs by mouth daily.  Marland Kitchen levothyroxine (SYNTHROID) 75 MCG tablet Take 1 tablet (75 mcg total) by mouth daily. (Patient taking differently: Take 75 mcg by mouth daily. Taking 1 tablet every day except 1/2 tablet on Tues and Thurs)  . losartan (COZAAR) 25 MG tablet Take 1 tablet by mouth once daily  . Magnesium 400 MG TABS Take 250 mg by mouth daily.  . Multiple Vitamins-Minerals (MULTIVITAMIN WITH MINERALS) tablet Take 1 tablet by mouth daily.  . Omega-3 Fatty Acids (FISH OIL) 1000 MG CAPS Take 2,000 mg by mouth in the morning and at bedtime.  Marland Kitchen omeprazole (PRILOSEC) 40 MG capsule Take 1 capsule (40 mg total) by mouth daily.  . polyethylene glycol (MIRALAX / GLYCOLAX) packet Take 17 g by mouth daily.  Marland Kitchen senna (SENOKOT) 8.6 MG TABS tablet Take 1 tablet (8.6 mg total) by mouth  at bedtime as needed for mild constipation. (Patient not taking: Reported on 01/30/2020)  . simvastatin (ZOCOR) 20 MG tablet Take 1 tablet (20 mg total) by mouth at bedtime.  . tretinoin (RETIN-A) 0.025 % cream APPLY TOPICALLY TO FACE AT BEDTIME AS NEEDED  . vitamin B-12 (CYANOCOBALAMIN)  1000 MCG tablet Take 1,000 mcg by mouth every other day.  . vitamin C (ASCORBIC ACID) 500 MG tablet Take 1,000 mg by mouth daily.  . vitamin E 45 MG (100 UNITS) capsule Take by mouth daily.  [name] 20 MG TABS tablet TAKE 1 TABLET BY MOUTH ONCE DAILY WITH SUPPER  . zinc gluconate 50 MG tablet Take 50 mg by mouth daily.  . [DISCONTINUED] metoprolol succinate (TOPROL-XL) 50 MG 24 hr tablet Take 0.5 tablets (25 mg total) by mouth daily. Take with or immediately following a meal.  . [DISCONTINUED] Pembrolizumab (KEYTRUDA IV) Inject 200 mg into the vein every 21 ( twenty-one) days.  . Artificial Saliva (SALIVASURE) LOZG Use as directed 1 lozenge in the mouth or throat every 4 (four) hours as needed. (Patient not taking: Reported on 01/30/2020)  . Menthol-Methyl Salicylate (MUSCLE RUB) 10-15 % CREA Apply 1 application topically as needed for muscle pain.   No facility-administered encounter medications on file as of 01/09/2020.   Patient reports that she doesn't have any trouble with her medications other than that Xarelto is expensive and she pays about $45 a month in the donut hole.   Patient reports she is not a couch potato and tries to stay active in her house. She goes up and down the steps to the bathroom in the house and lives in a 3 floor home with the family room downstairs and the bathroom and bedrooms on the second floor.  Current Diagnosis/Assessment:  Goals Addressed            This Visit's Progress   . Pharmacy Care Plan       CARE PLAN ENTRY (see longitudinal plan of care for additional care plan information)  Current Barriers:  . Chronic Disease Management support, education, and care coordination needs related to Hypertension, Hyperlipidemia, Atrial Fibrillation, GERD, and Hypothyroidism   Hypertension BP Readings from Last 3 Encounters:  01/30/20 (!) 137/57  01/22/20 106/70  01/21/20 106/65   . Pharmacist Clinical Goal(s): o Over the next 90 days, patient will work  with PharmD and providers to achieve BP goal <130/80 . Current regimen:  . Metoprolol succinate 50 mg 1/2 tablet daily . Losartan 25 mg 1 tablet daily  . Interventions: o DASH eating plan recommendations: . Emphasizes vegetables, fruits, and whole-grains . Includes fat-free or low-fat dairy products, fish, poultry, beans, nuts, and vegetable oils . Limits foods that are high in saturated fat. These foods include fatty meats, full-fat dairy products, and tropical oils such as coconut, palm kernel, and palm oils. . Limits sugar-sweetened beverages and sweets . Limiting sodium intake to < 1500 mg/day . Patient self care activities - Over the next 90 days, patient will: o Check blood pressure at least 1-2 times a week, document, and provide at future appointments o Ensure daily salt intake < 2300 mg/day  Hyperlipidemia Lab Results  Component Value Date/Time   LDLCALC 90 11/13/2019 10:33 AM   LDLDIRECT 84.3 12/16/2010 09:21 AM   . Pharmacist Clinical Goal(s): o Over the next 90 days, patient will work with PharmD and providers to maintain LDL goal < 100 . Current regimen:  . Simvastatin 20 mg 1 tablet at bedtime  .  Omega 3 fish oil 1,000 mg 2 capsules twice daily . Interventions: o Lowering cholesterol through diet by: Marland Kitchen Limiting foods with cholesterol such as liver and other organ meats, egg yolks, shrimp, and whole milk dairy products . Avoiding saturated fats and trans fats and incorporating healthier fats, such as lean meat, nuts, and unsaturated oils like canola and olive oils . Eating foods with soluble fiber such as whole-grain cereals such as oatmeal and oat bran, fruits such as apples, bananas, oranges, pears, and prunes, legumes such as kidney beans, lentils, chick peas, black-eyed peas, and lima beans, and green leafy vegetables . Limiting alcohol intake . Patient self care activities - Over the next 90 days, patient will: o Continue current medications  Hypothyroidism Lab  Results  Component Value Date   TSH 7.555 (H) 01/21/2020   . Pharmacist Clinical Goal(s): o Over the next 90 days, patient will work with PharmD and providers to achieve TSH between 0.4 to 4.5 . Current regimen:  o Levothyroxine 75 mcg 1 tablet daily except 1/2 tablet on Tues and Thurs  . Interventions: o We discussed taking consistently on an empty stomach before other medications and with water . Patient self care activities - Over the next 90 days, patient will: o Continue current medication  Afib . Pharmacist Clinical Goal(s) o Over the next 90 days, patient will work with PharmD and providers to maintain heart rate < 110 beats per minute . Current regimen:  . Metoprolol succinate 50 mg 1/2 tablet daily . Xarelto 20 mg 1 tablet with supper . Interventions: o Discussed monitoring heart rate along with blood pressure o Discussed monitoring for signs of bleeding such as unexplained and excessive bleeding from a cut or injury, easy or excessive bruising, blood in urine or stools, and nosebleeds without a known cause . Patient self care activities - Over the next 90 days, patient will: o Continue current medications  GERD . Pharmacist Clinical Goal(s) o Over the next 90 days, patient will work with PharmD and providers to manage symptoms of heart burn/acid reflux . Current regimen:  . Omeprazole 40 mg capsule 1 capsule daily  . Mylanta as needed . Interventions: o Discussed non-pharmacologic management of symptoms such as elevating the head of your bed, avoiding eating 2-3 hours before bed, avoiding triggering foods such as acidic, spicy, or fatty foods, eating smaller meals, and wearing clothes that are loose around the waist . Patient self care activities - Over the next 90 days, patient will: o Continue current medications  Medication management . Pharmacist Clinical Goal(s): o Over the next 90 days, patient will work with PharmD and providers to maintain optimal medication  adherence . Current pharmacy: [facility] . Interventions o Comprehensive medication review performed. o Continue current medication management strategy . Patient self care activities - Over the next 90 days, patient will: o Take medications as prescribed o Report any questions or concerns to PharmD and/or provider(s)  Initial goal documentation       SDOH Interventions   Flowsheet Row Most Recent Value  SDOH Interventions   Financial Strain Interventions Intervention Not Indicated  Transportation Interventions Intervention Not Indicated      Hypertension   BP goal is:  <140/90  Office blood pressures are  BP Readings from Last 3 Encounters:  01/30/20 (!) 137/57  01/22/20 106/70  01/21/20 106/65   Patient checks BP at home daily Patient home BP readings are ranging: 132/74 (pretty typical)  Patient has failed these meds in the  past: none Patient is currently controlled on the following medications:  Marland Kitchen Metoprolol succinate 50 mg 1/2 tablet daily . Losartan 25 mg 1 tablet daily   We discussed diet and exercise extensively  -DASH eating plan recommendations: . Emphasizes vegetables, fruits, and whole-grains . Includes fat-free or low-fat dairy products, fish, poultry, beans, nuts, and vegetable oils . Limits foods that are high in saturated fat. These foods include fatty meats, full-fat dairy products, and tropical oils such as coconut, palm kernel, and palm oils. . Limits sugar-sweetened beverages and sweets . Limiting sodium intake to < 1500 mg/day -Diet: doesn't use much salt on food but does use a bit on creamed potatoes; tries to use unsalted or margarine and looks at package labels for sodium content -Discussed recommendations for moderate aerobic exercise for 150 minutes/week spread out over 5 days for heart healthy lifestyle   Plan  Continue current medications   AFIB   Patient is currently rate controlled. Office heart rates are  Pulse Readings  from Last 3 Encounters:  01/30/20 (!) 58  01/22/20 74  01/21/20 65    CHA2DS2-VASc Score =   4 The patient's score is based upon: age, sex, HTN diagnosis   Patient has failed these meds in past: none Patient is currently controlled on the following medications:  Marland Kitchen Metoprolol succinate 50 mg 1/2 tablet daily . Xarelto 20 mg 1 tablet with supper  We discussed:  monitoring HR along with BP; Monitoring for signs of bleeding such as unexplained and excessive bleeding from a cut or injury, easy or excessive bruising, blood in urine or stools, and nosebleeds without a known cause  -Patient only takes with milk when she misses a dose of Xarelto - recommended with some food even if a snack  Plan Continue current medications    Hyperlipidemia   LDL goal < 100  Last lipids Lab Results  Component Value Date   CHOL 186 11/13/2019   HDL 71 11/13/2019   LDLCALC 90 11/13/2019   LDLDIRECT 84.3 12/16/2010   TRIG 147 11/13/2019   CHOLHDL 2.6 11/13/2019   Hepatic Function Latest Ref Rng & Units 01/21/2020 12/31/2019 12/09/2019  Total Protein 6.5 - 8.1 g/dL 7.3 7.3 7.5  Albumin 3.5 - 5.0 g/dL 3.5 3.5 3.6  AST 15 - 41 U/L 42(H) 27 26  ALT 0 - 44 U/L _0 Alk Phosphatase 38 - 126 U/L 75 67 74  Total Bilirubin 0.3 - 1.2 mg/dL 0.6 0.5 0.6  Bilirubin, Direct 0.0 - 0.3 mg/dL - - -     The 10-year ASCVD risk score Mikey Bussing DC Jr., et al., 2013) is: 32.8%   Values used to calculate the score:     Age: 38 years     Sex: Female     Is Non-Hispanic African American: No     Diabetic: No     Tobacco smoker: Yes     Systolic Blood Pressure: 942 mmHg     Is BP treated: Yes     HDL Cholesterol: 71 mg/dL     Total Cholesterol: 186 mg/dL   Patient has failed these meds in past: none Patient is currently controlled on the following medications:  . Simvastatin 20 mg 1 tablet at bedtime (missed last night) . Omega 3 fish oil 1,000 mg 2 capsules twice daily  We discussed:  diet and exercise  extensively -Diet: patient doesn't eat out a lot as it is too expensive and doesn't eat fast food  Plan  Continue  current medications   GERD   Patient has failed these meds in past: none Patient is currently controlled on the following medications:  . Omeprazole 40 mg capsule 1 capsule daily  . Mylanta PRN  We discussed:  Non-pharmacologic management of symptoms such as elevating the head of your bed, avoiding eating 2-3 hours before bed, avoiding triggering foods such as acidic, spicy, or fatty foods, eating smaller meals, and wearing clothes that are loose around the waist -Triggers: fudge, overeating   Plan  Continue current medications   Hypothyroidism   Lab Results  Component Value Date/Time   TSH 7.555 (H) 01/21/2020 09:50 AM   TSH 3.311 12/31/2019 12:10 PM   TSH 2.06 11/03/2017 10:19 AM   TSH 0.59 03/21/2017 03:21 PM    Patient has failed these meds in past: none Patient is currently controlled on the following medications:  . Levothyroxine 75 mcg 1 tablet daily except 1/2 tablet on Tues and Thurs   We discussed:  taking consistently on an empty stomach before other medications and with water  Plan  Continue current medications   Anxiety   Patient has failed these meds in past: none Patient is currently controlled on the following medications:  . Alprazolam 0.5 mg 1/2 tablet once daily   We discussed:  Patient sometimes takes 4 days a week; long term risks of taking benzodiazepines  Plan Consider stable medication for anxiety vs PRN use - reassess at follow up. Continue current medications   Pain   Patient has failed these meds in past: none Patient is currently controlled on the following medications:  . Hydromorphone 2 mg daily as needed for severe pain . Gabapentin 300 mg 1 capsule twice daily - taking once daily due to sleepiness  We discussed: patient didn't take pain medicine as it causes heartburn and only uses as needed; she also only takes  tylenol PRN   Plan  Continue current medications   Osteoporosis   Last DEXA Scan: 05/26/2016 (unable to access)  T-Score femoral neck: n/a  T-Score total hip: n/a  T-Score lumbar spine: n/a  T-Score forearm radius: n/a  10-year probability of major osteoporotic fracture: n/a  10-year probability of hip fracture: n/a  Vit D, 25-Hydroxy  Date Value Ref Range Status  03/23/2015 35 30 - 100 ng/mL Final    Comment:    Vitamin D Status           25-OH Vitamin D        Deficiency                <20 ng/mL        Insufficiency         20 - 29 ng/mL        Optimal             > or = 30 ng/mL   For 25-OH Vitamin D testing on patients on D2-supplementation and patients for whom quantitation of D2 and D3 fractions is required, the QuestAssureD 25-OH VIT D, (D2,D3), LC/MS/MS is recommended: order code [id number] (patients > 2 yrs).      Patient is a candidate for pharmacologic treatment due to T-Score < -2.5 in femoral neck  Patient has failed these meds in past: Fosamax (8 years of treatment) Patient is currently controlled on the following medications:  Marland Kitchen Vitamin D 1000 units 1 tablet daily . Calcium 600 mg 1 tablet daily   We discussed:  Recommend [phone] units of vitamin D daily. Recommend 1200 mg  of calcium daily from dietary and supplemental sources.     Plan Patient will make sure to get an adequate amount of calcium daily. Continue current medications   Constipation   Patient has failed these meds in past: none Patient is currently controlled on the following medications:  Marland Kitchen Miralax 1 capful daily as needed . Senna 9.6 mg 1 tablet at bedtime as needed  We discussed:  Patient reports treatments make her constipated; recommended Fibercon or more fiber foods; recommended drinking plenty of fluids  Plan  Continue current medications   Miscellaneous/OTC   Patient is currently on the following medications:  Marland Kitchen Magnesium 400 mg 1 tablet daily - takes for leg  cramps . Muscle rub cream as needed  . Multivitamin 1 tablet daily . Vitamin C 500 mg 2 tablets daily . Vitamin E 100 units 1 capsule daily . Zinc gluconate 50 mg 1 tablet daily . Vitamin B12 1000 mcg 1 tablet every other day . Estradiol 0.1 mg/gm vaginal cream . Flonase 50 mcg/act 2 sprays into both nostrils daily . Boost plus - not drinking recently a whole lot  We discussed:  Fat soluble nature of vitamin D and E and to make sure to take with food  Plan  Continue current medications   Medication Management   Patient's preferred pharmacy is:  [facility] [place], [place] [address] [place] [address] Phone: [phone] Fax: [phone]  [facility]) - [place], [address] [place] [address] Phone: [phone] Fax: [facility] [address], [place] - [place] [address] [place] [address] [place] [address] Phone: [phone] Fax: [phone]  Uses pill box? Yes - weekly Pt endorses 90% compliance  We discussed: Discussed benefits of medication synchronization, packaging and delivery as well as enhanced pharmacist oversight with [employer].  Plan  Continue current medication management strategy  Follow up: 3 month phone visit  Jeni Salles, PharmD [place] Pharmacist [place] [facility] [place] [phone]

## 2020-01-14 ENCOUNTER — Telehealth: Payer: Self-pay | Admitting: Family Medicine

## 2020-01-14 NOTE — Telephone Encounter (Signed)
Pt is having issue with her abdomen and she stated that she is swollen and has been taking omeprazole and it is not helping her.  This symptom has been going on for over a week and pt has decided to make an appointment.

## 2020-01-17 ENCOUNTER — Ambulatory Visit (HOSPITAL_COMMUNITY)
Admission: RE | Admit: 2020-01-17 | Discharge: 2020-01-17 | Disposition: A | Discharge status: Home / Self Care | Payer: PPO | Source: Ambulatory Visit | Attending: Hematology | Admitting: Hematology

## 2020-01-17 ENCOUNTER — Other Ambulatory Visit: Payer: Self-pay

## 2020-01-17 DIAGNOSIS — C561 Malignant neoplasm of right ovary: Secondary | ICD-10-CM | POA: Diagnosis not present

## 2020-01-17 DIAGNOSIS — J984 Other disorders of lung: Secondary | ICD-10-CM | POA: Diagnosis not present

## 2020-01-17 DIAGNOSIS — K449 Diaphragmatic hernia without obstruction or gangrene: Secondary | ICD-10-CM | POA: Diagnosis not present

## 2020-01-17 DIAGNOSIS — K7689 Other specified diseases of liver: Secondary | ICD-10-CM | POA: Diagnosis not present

## 2020-01-17 DIAGNOSIS — M47816 Spondylosis without myelopathy or radiculopathy, lumbar region: Secondary | ICD-10-CM | POA: Diagnosis not present

## 2020-01-17 DIAGNOSIS — I700 Atherosclerosis of aorta: Secondary | ICD-10-CM | POA: Diagnosis not present

## 2020-01-17 MED ORDER — IOHEXOL 300 MG/ML  SOLN
100.0000 mL | Freq: Once | INTRAMUSCULAR | Status: AC | PRN
  Administered 2020-01-17: 100 mL via INTRAVENOUS

## 2020-01-21 ENCOUNTER — Inpatient Hospital Stay (HOSPITAL_COMMUNITY): Payer: PPO | Attending: Hematology

## 2020-01-21 ENCOUNTER — Inpatient Hospital Stay (HOSPITAL_COMMUNITY): Payer: PPO

## 2020-01-21 ENCOUNTER — Other Ambulatory Visit (HOSPITAL_COMMUNITY): Payer: Self-pay

## 2020-01-21 ENCOUNTER — Other Ambulatory Visit: Payer: Self-pay

## 2020-01-21 ENCOUNTER — Inpatient Hospital Stay (HOSPITAL_COMMUNITY): Payer: PPO | Admitting: Hematology

## 2020-01-21 VITALS — BP 106/65 | HR 65 | Temp 97.1°F | Resp 18 | Wt 157.4 lb

## 2020-01-21 DIAGNOSIS — E039 Hypothyroidism, unspecified: Secondary | ICD-10-CM | POA: Diagnosis not present

## 2020-01-21 DIAGNOSIS — C561 Malignant neoplasm of right ovary: Secondary | ICD-10-CM | POA: Insufficient documentation

## 2020-01-21 DIAGNOSIS — R110 Nausea: Secondary | ICD-10-CM | POA: Insufficient documentation

## 2020-01-21 DIAGNOSIS — Z9221 Personal history of antineoplastic chemotherapy: Secondary | ICD-10-CM | POA: Insufficient documentation

## 2020-01-21 DIAGNOSIS — C786 Secondary malignant neoplasm of retroperitoneum and peritoneum: Secondary | ICD-10-CM | POA: Insufficient documentation

## 2020-01-21 DIAGNOSIS — M79606 Pain in leg, unspecified: Secondary | ICD-10-CM

## 2020-01-21 DIAGNOSIS — F17210 Nicotine dependence, cigarettes, uncomplicated: Secondary | ICD-10-CM | POA: Diagnosis not present

## 2020-01-21 DIAGNOSIS — Z7189 Other specified counseling: Secondary | ICD-10-CM

## 2020-01-21 DIAGNOSIS — C562 Malignant neoplasm of left ovary: Secondary | ICD-10-CM | POA: Insufficient documentation

## 2020-01-21 LAB — TSH: TSH: 7.555 u[IU]/mL — ABNORMAL HIGH (ref 0.350–4.500)

## 2020-01-21 LAB — CBC WITH DIFFERENTIAL/PLATELET
Abs Immature Granulocytes: 0.06 10*3/uL (ref 0.00–0.07)
Basophils Absolute: 0.1 10*3/uL (ref 0.0–0.1)
Basophils Relative: 1 %
Eosinophils Absolute: 0.3 10*3/uL (ref 0.0–0.5)
Eosinophils Relative: 4 %
HCT: 35 % — ABNORMAL LOW (ref 36.0–46.0)
Hemoglobin: 10.9 g/dL — ABNORMAL LOW (ref 12.0–15.0)
Immature Granulocytes: 1 %
Lymphocytes Relative: 22 %
Lymphs Abs: 1.7 10*3/uL (ref 0.7–4.0)
MCH: 29.3 pg (ref 26.0–34.0)
MCHC: 31.1 g/dL (ref 30.0–36.0)
MCV: 94.1 fL (ref 80.0–100.0)
Monocytes Absolute: 0.7 10*3/uL (ref 0.1–1.0)
Monocytes Relative: 8 %
Neutro Abs: 5 10*3/uL (ref 1.7–7.7)
Neutrophils Relative %: 64 %
Platelets: 223 10*3/uL (ref 150–400)
RBC: 3.72 MIL/uL — ABNORMAL LOW (ref 3.87–5.11)
RDW: 13.9 % (ref 11.5–15.5)
WBC: 7.8 10*3/uL (ref 4.0–10.5)
nRBC: 0 % (ref 0.0–0.2)

## 2020-01-21 LAB — COMPREHENSIVE METABOLIC PANEL
ALT: 19 U/L (ref 0–44)
AST: 42 U/L — ABNORMAL HIGH (ref 15–41)
Albumin: 3.5 g/dL (ref 3.5–5.0)
Alkaline Phosphatase: 75 U/L (ref 38–126)
Anion gap: 7 (ref 5–15)
BUN: 16 mg/dL (ref 8–23)
CO2: 25 mmol/L (ref 22–32)
Calcium: 8.9 mg/dL (ref 8.9–10.3)
Chloride: 101 mmol/L (ref 98–111)
Creatinine, Ser: 0.85 mg/dL (ref 0.44–1.00)
GFR, Estimated: >60 mL/min (ref >60)
Glucose, Bld: 92 mg/dL (ref 70–99)
Potassium: 3.9 mmol/L (ref 3.5–5.1)
Sodium: 133 mmol/L — ABNORMAL LOW (ref 135–145)
Total Bilirubin: 0.6 mg/dL (ref 0.3–1.2)
Total Protein: 7.3 g/dL (ref 6.5–8.1)

## 2020-01-21 MED ORDER — SODIUM CHLORIDE 0.9% FLUSH
10.0000 mL | Freq: Once | INTRAVENOUS | Status: AC
  Administered 2020-01-21: 10 mL via INTRAVENOUS

## 2020-01-21 MED ORDER — PROCHLORPERAZINE MALEATE 10 MG PO TABS: 10.0000 mg | ORAL_TABLET | Freq: Four times a day (QID) | ORAL | 3 refills | Status: AC | PRN

## 2020-01-21 NOTE — Progress Notes (Signed)
[place], [place] [address]   CLINIC:  Medical Oncology/Hematology  PCP:  Martinique, Avital G, MD [address] /  [place] [address] [phone]   REASON FOR VISIT:  Follow-up for right ovarian cancer  PRIOR THERAPY:  1. Neoadjuvant chemotherapy with carboplatin and paclitaxel x 3 cycles from 01/18/2018 to 03/02/2018. 2. BSO and omentectomy on 03/27/2018 followed by 3 more cycles of carboplatin and paclitaxel from 04/20/2018 to 05/31/2018. 3. Doxil and Avastin x 5 cycles from 07/16/2018 to 11/14/2018.  NGS Results: PD-L1 CPS 5%, Foundation 1 MS--stable, TMB 5 Muts/Mb  CURRENT THERAPY: Keytruda every 3 weeks  BRIEF ONCOLOGIC HISTORY:  Oncology History Overview Note  Neg for genetics blood work and HRD High grade serous, recurrent biopsy proven PD-L1 5%  Relapsed within a few months of finishing carbo/taxol, minimal response to Doxil/Avastin, progressed on Gemzar   Right ovarian epithelial cancer ([place])  01/06/2018 Imaging   Ct abdomen and pelvis 1. Complex cystic mass at the right adnexa, measuring 5.9 x 4.0 cm, with nodular components, concerning for primary ovarian malignancy. 2. Diffuse nodularity along the omentum at the left side of the abdomen, extending into the mesentery at the left mid abdomen, concerning for peritoneal carcinomatosis. 3. Wall thickening at the distal ileum adjacent to the ovarian mass; bowel loops appear somewhat adherent to the ovarian mass. Bowel infiltration with tumor cannot be excluded. No evidence of bowel obstruction at this time. 4. Small volume ascites within the abdomen and pelvis.  Aortic Atherosclerosis (ICD10-I70.0).   01/08/2018 Tumor Marker   Patient's tumor was tested for the following markers: CA-125. Results of the tumor marker test revealed 3004   01/15/2018 Imaging   Chest CT:  1. No active cardiopulmonary disease. 2. Aortic atherosclerosis without aneurysm or dissection. 3.  No large central pulmonary embolus.  CT AP:  1. Dilated fluid-filled loops of small bowel are redemonstrated slightly more extensive than on prior exam with transition point likely in the right adnexa adjacent to a complex cystic mass concerning for ovarian neoplasm given septations and soft tissue nodularity. This raises concern for early or partial SBO. This soft tissue mass measures 4.9 x 4 x 4.7 cm and has not changed since prior recent comparison. Additional short segmental area of luminal narrowing is noted in the right lower quadrant involving small bowel for which stigmata of peritoneal carcinomatosis or small-bowel metastatic implants might account for this. 2. Redemonstration of small volume of ascites predominantly in the upper abdomen surrounding the liver and spleen. 3. Redemonstration of thick bandlike omental thickening concerning for peritoneal carcinomatosis.    01/15/2018 Procedure   Successful ultrasound-guided diagnostic and therapeutic paracentesis yielding 1.5 liters of peritoneal fluid.    01/15/2018 Pathology Results   PERITONEAL/ASCITIC FLUID (SPECIMEN 1 OF 1 COLLECTED 01/18/18): MALIGNANT CELLS CONSISTENT WITH METASTATIC ADENOCARCINOMA. SEE COMMENT. COMMENT: THE MALIGNANT CELLS ARE POSITIVE FOR MOC-31, CYTOKERATIN 7, ESTROGEN RECEPTOR, PAX-8, AND WT-1. THEY ARE NEGATIVE FOR CALRETININ, CYTOKERATIN 5/6, AND CYTOKERATIN 20. THE PROFILE IS CONSISTENT WITH A PRIMARY GYNECOLOGIC CARCINOMA. THERE IS LIKELY SUFFICIENT TUMOR PRESENT, IF ADDITIONAL STUDIES ARE REQUESTED.   01/15/2018 - 02/02/2018 Hospital Admission   She was admitted to the hospital for SBO. She was treated with chemotherapy   01/18/2018 - 06/20/2018 Chemotherapy   The patient had carboplatin and taxol   02/08/2018 Cancer Staging   Staging form: Ovary, Fallopian Tube, and Primary Peritoneal Carcinoma, AJCC 8th Edition - Clinical: cT3, cN0, cM0 - Signed by Heath Lark,  MD on 02/08/2018   02/08/2018 Tumor Marker    Patient's tumor was tested for the following markers: CA-125. Results of the tumor marker test revealed 445   03/02/2018 Tumor Marker   Patient's tumor was tested for the following markers: CA-125. Results of the tumor marker test revealed 174   03/15/2018 Imaging   6.2 cm complex cystic right ovarian mass, compatible with malignant ovarian neoplasm, mildly progressive.  Associated peritoneal disease/omental caking beneath the anterior abdominal wall, mildly improved. Prior abdominal ascites is improved/resolved.  4.7 cm cystic left ovarian mass, without overt malignant features, grossly unchanged.  Mild bilateral hydroureteronephrosis, secondary to extrinsic compression, new.  Prior small bowel obstruction has improved/resolved.    03/27/2018 Pathology Results   1. Omentum, resection for tumor - OMENTUM: - HIGH GRADE SEROUS CARCINOMA. 2. Ovary, left - LEFT OVARY: - HIGH GRADE SEROUS CARCINOMA. - LEFT FALLOPIAN TUBE: - HIGH GRADE SEROUS CARCINOMA. 3. Adnexa - ovary +/- tube, neoplastic, right - RIGHT OVARY: - HIGH GRADE SEROUS CARCINOMA, 5.5 CM. - RIGHT FALLOPIAN TUBE: - HIGH GRADE SEROUS CARCINOMA. 4. Peritoneum, biopsy, nodule - PERITONEUM: - NODULES OF HIGH GRADE SEROUS CARCINOMA. 5. Peritoneum, resection for tumor, rectosigmoid - RECTOSIGMOID PERITONEUM: - HIGH GRADE SEROUS CARCINOMA. Microscopic Comment 3. OVARY or FALLOPIAN TUBE or PRIMARY PERITONEUM: Procedure: Bilateral salpingo-oophorectomy, omentectomy and peritoneal biopsies. Specimen Integrity: N/A. Tumor Site: Right ovary. Ovarian Surface Involvement (required only if applicable): Yes. Fallopian Tube Surface Involvement (required only if applicable): Yes. Tumor Size: 5.5 x 4.8 x 4.0 cm. Histologic Type: Serous carcinoma. Histologic Grade: High grade. Implants (required for advanced stage serous/seromucinous borderline tumors only): Omentum, left ovary and fallopian tube, rectosigmoid peritoneum and  peritoneum. Other Tissue/ Organ Involvement: Left ovary and fallopian tube, omentum and rectosigmoid peritoneum. Largest Extrapelvic Peritoneal Focus (required only if applicable): 74.1 cm, omentum. Peritoneal/Ascitic Fluid: N/A. Treatment Effect (required only for high-grade serous carcinomas): Minimal. Regional Lymph Nodes: No lymph nodes submitted. Pathologic Stage Classification (pTNM, AJCC 8th Edition): pT3c, pNX. Representative Tumor Block: 3A, 3B, 3D, 3E and 63F. Comment(s): There is a 5.5 cm in greatest dimension partially cystic high grade serous carcinoma involving the right adnexal specimen and the tumor is staged as a primary right ovarian carcinoma. The carcinoma also involves the left ovary as well as the left fallopian tube, the omentum, peritoneum and the rectosigmoid peritoneal specimens.   03/27/2018 Surgery   Preoperative Diagnosis: ovarian cancer, stage IIIC, metastatic to omentum, peritoneum, serosa of intestines   Procedure(s) Performed: 1. Exploratory laparotomy with bilateral salpingo-oophorectomy, omentectomy radical tumor debulking for ovarian cancer .  Surgeon: Thereasa Solo, MD.   Specimens: Bilateral tubes / ovaries, omentum. Peritoneal nodules, rectosigmoid nodules.    Operative Findings: omental cake, miliary studding on diaphragm and bilateral paracolic gutters. Sigmoid colon densely adherent to left and right ovaries with tumor rind, ureters mildly dilated bilaterally due to retroperitoneal extension of the tumor towards ureters causing compression.    This represented an optimal cytoreduction (R1) with gross residual disease on the diaphragm that had been ablated and a thin tumor plaque on the sigmoid colon that was ablated. No residual disease >1cm remaining   05/07/2018 Genetic Testing   Negative genetic testing on the [employer] TumorNextHRD+ CancerNext Panel. The CancerNext gene panel offered by [employer] includes sequencing and rearrangement analysis for  the following 34 genes:   APC, ATM, BARD1, BMPR1A, BRCA1, BRCA2, BRIP1, CDH1, CDK4, CDKN2A, CHEK2, DICER1, EPCAM, GREM1, HOXB13, MLH1, MRE11A, MSH2, MSH6, MUTYH, NBN, NF1, PALB2, PMS2, POLD1, POLE,  PTEN, RAD50, RAD51C, RAD51D, SMAD4, SMARCA4, STK11, and TP53. Somatic genes analyzed through TumorNext-HRD: ATM, BARD1, BRCA1, BRCA2, BRIP1, CHEK2, MRE11A, NBN, PALB2, RAD51C, RAD51D. The report date is 05/07/2018.   05/11/2018 Tumor Marker   Patient's tumor was tested for the following markers: CA-125. Results of the tumor marker test revealed 60.5   07/02/2018 Imaging   1. Mild right pericardiophrenic adenopathy is mildly increased, suspicious for metastatic nodes. No abdominopelvic adenopathy. 2. Small left peritoneal soft tissue nodule adjacent to the splenic flexure and smooth left pelvic sidewall peritoneal thickening, cannot exclude residual/recurrent disease. Attention on follow-up CT advised. 3. Bilateral renal collecting system dilatation has improved. 4.  Aortic Atherosclerosis (ICD10-I70.0).   07/02/2018 Tumor Marker   Patient's tumor was tested for the following markers: CA-125 Results of the tumor marker test revealed 42.7   07/11/2018 Echocardiogram   1. The left ventricle has normal systolic function with an ejection fraction of 60-65%. The cavity size was normal. Left ventricular diastolic parameters were normal.  2. Normal GLS -20.1.  3. The right ventricle has normal systolic function. The cavity was normal. There is no increase in right ventricular wall thickness.  4. The mitral valve is degenerative. Moderate thickening of the mitral valve leaflet. Moderate calcification of the mitral valve leaflet.  5. The aortic valve is tricuspid. Moderate thickening of the aortic valve. Moderate calcification of the aortic valve. Aortic valve regurgitation is trivial by color flow Doppler.  6. The aortic root is normal in size and structure.   07/16/2018 - 12/03/2018 Chemotherapy   The patient had  doxorubicin and bevacizumab for chemotherapy treatment.     07/30/2018 Tumor Marker   Patient's tumor was tested for the following markers: CA-125 Results of the tumor marker test revealed 82.6   08/27/2018 Tumor Marker   Patient's tumor was tested for the following markers: CA-125 Results of the tumor marker test revealed 111.   09/24/2018 Tumor Marker   Patient's tumor was tested for the following markers: CA-125 Results of the tumor marker test revealed 142.   10/05/2018 Imaging   1. Stable right juxta diaphragmatic/pericardiophrenic lymph node, mildly enlarged. 2. Otherwise no definite metastatic disease identified in the abdomen/pelvis. 3. 3 mm peritoneal nodule noted left paracolic gutter on today's study, indeterminate. Attention on follow-up recommended. 4.  Aortic Atherosclerois (ICD10-170.0)   10/16/2018 Echocardiogram    1. No significant change from prior study (07/11/2018).  2. Left ventricular ejection fraction, by visual estimation, is 60 to 65%. The left ventricle has normal function. Normal left ventricular size. There is no left ventricular hypertrophy.  3. LVEF by 3D assessment 63%.  4. The average left ventricular global longitudinal strain is -22.4 %.  5. Global right ventricle has normal systolic function.The right ventricular size is normal. No increase in right ventricular wall thickness.  6. Left atrial size was normal.  7. Right atrial size was normal.  8. Presence of pericardial fat pad.  9. Moderate thickening of the mitral valve leaflet(s). 10. The mitral valve is normal in structure. Trace mitral valve regurgitation. 11. The tricuspid valve is normal in structure. Tricuspid valve regurgitation is mild. 12. Aortic regurgitation PHT measures 467 msec. 13. The aortic valve is tricuspid Aortic valve regurgitation is mild by color flow Doppler. Mild aortic valve sclerosis without stenosis. 14. There is mild to moderate calcification of the AoV, with focal  calcification of the [place]. The aortic regurgitation is mild in severity, likely related to degenerative valve disease. 15. The pulmonic valve was grossly  normal. Pulmonic valve regurgitation is not visualized by color flow Doppler. 16. Mild plaque invoving the ascending aorta. 17. Normal pulmonary artery systolic pressure. 18. The tricuspid regurgitant velocity is 2.52 m/s, and with an assumed right atrial pressure of 3 mmHg, the estimated right ventricular systolic pressure is normal at 28.4 mmHg.   10/26/2018 Tumor Marker   Patient's tumor was tested for the following markers: CA-125 Results of the tumor marker test revealed 196   12/11/2018 Imaging   1. No substantial interval change in exam. No definite findings to suggest recurrent/metastatic disease. 2. Right pericardial phrenic lymph node noted on multiple prior studies is unchanged in the interval. 3. 8 mm omental nodule identified on today's exam. Close attention on follow-up recommended. 3 mm soft tissue nodule along the left paracolic gutter seen previously has resolved in the interval. 4.  Aortic Atherosclerois (ICD10-170.0)   04/02/2019 Imaging   1. Interval development of left axillary and subpectoral lymphadenopathy is highly concerning for metastatic disease, however, this would be a highly unusual pattern of metastatic spread for an ovarian primary neoplasm. This is most concerning for potential left-sided breast cancer. Correlation with mammography is strongly recommended. 2. No other signs of definite metastatic disease noted elsewhere in the chest, abdomen or pelvis. 3. Aortic atherosclerosis. 4. Additional incidental findings, similar to prior studies, as above.     04/03/2019 Pathology Results   Immunohistochemistry shows the carcinoma is positive with cytokeratin AE1/AE3, cytokeratin 7, PAX-8, WT-1, and shows patchy positivity with estrogen receptor. The carcinoma is negative with GATA3, GCDFP, p53, Napsin A and TTF-1. The  immunophenotype is most consistent with metastatic high grade serous carcinoma. (JDP:ah 04/04/19)FINAL DIAGNOSIS Diagnosis Lymph node, needle/core biopsy, left axilla - METASTATIC CARCINOMA. - SEE MICROSCOPIC DESCRIPT    Genetic Testing   Patient has genetic testing done for PD-L1. Results revealed patient has the following: PD-L1 staining in tumor cells (TC): 3% PD-L1 staining in tumor-associated immune cells (IC): 10% PD-L1 combined positive score (CPS): 5%   04/15/2019 - 06/13/2019 Chemotherapy   The patient had gemzar for chemotherapy treatment.     06/21/2019 Imaging   1. Bulky LEFT axillary adenopathy greater than RIGHT axillary adenopathy increasing in size. 2. Developing nodularity and or small lymph nodes along the LEFT hemidiaphragm in the inferior pre pericardial fat. Attention on follow-up. 3. Stable small lymph nodes throughout the retroperitoneum. 4. Stable low-density hepatic lesions compatible with cysts 5. Aortic atherosclerosis.   Aortic Atherosclerosis (ICD10-I70.0).     10/03/2019 Genetic Testing   Foundation One Results:     11/18/2019 -  Chemotherapy   The patient had pembrolizumab (KEYTRUDA) 200 mg in sodium chloride 0.9 % 50 mL chemo infusion, 200 mg, Intravenous, Once, 3 of 6 cycles Administration: 200 mg (11/18/2019), 200 mg (12/31/2019), 200 mg (12/09/2019)  for chemotherapy treatment.      CANCER STAGING: Cancer Staging Right ovarian epithelial cancer ([place]) Staging form: Ovary, Fallopian Tube, and Primary Peritoneal Carcinoma, AJCC 8th Edition - Clinical: cT3, cN0, cM0 - Signed by Heath Lark, MD on 02/08/2018   INTERVAL HISTORY:  Haley [name], a 75 y.o. female, returns for routine follow-up and consideration for next cycle of immunotherapy. Anneth was last seen on 12/09/2019.  Due for cycle #4 of Keytruda today.   Today she is accompanied by her husband. Overall, she tells me she has been feeling pretty well. She reports having red spots  developed on her lower legs and chest, though they are not itchy. Her left arm is  still swollen and is causing occasional discomfort every day. She also complains of having nausea since starting Keytruda and started taking Compazine to control the nausea. When she was getting the chemo in [place], she tolerated it well with few symptoms. She complains of having new RLQ abdominal pain and general abdominal discomfort due to swelling.  Due to progression of her disease, she will be changed to topotecan.   REVIEW OF SYSTEMS:  Review of Systems  Constitutional: Positive for appetite change (75%) and fatigue (75%).  Gastrointestinal: Positive for nausea.  Musculoskeletal:       L arm swelling  Skin: Negative for itching.       Red spots on lower legs and face  All other systems reviewed and are negative.   PAST MEDICAL/SURGICAL HISTORY:  Past Medical History:  Diagnosis Date  . Allergy    SEASONAL  . Anxiety   . Cataract    BILATERAL  . Dysrhythmia   . Family history of lung cancer   . Family history of prostate cancer   . Family history of prostate cancer   . Family history of uterine cancer   . Fibromyalgia   . GERD (gastroesophageal reflux disease)   . H/O echocardiogram 04/28/08   EF>55% trace mitral regurgitation, No significant valvular pathology  . Hemorrhoids    internal  . History of stress test 02/08/2011   Normal Myocardial perfusion study, this is a low risk scan, No prior study available for comparison  . Hyperlipidemia   . Hypertension    hx of  . Hypothyroidism   . MVP (mitral valve prolapse)    mild and MR  . OA (osteoarthritis)   . Osteoporosis   . ovarian ca dx'd 12/2017   ovarian cancer  . Paroxysmal A-fib ([place])   . Sleep apnea    does not use prescribed CPAP  does not tolerate  . Thyroid disease    HYPO  . Varicosities of leg   . Vitamin D deficiency    Past Surgical History:  Procedure Laterality Date  . ABDOMINAL HYSTERECTOMY  1989  .  APPENDECTOMY  1989   with hysterectomy  . BILATERAL SALPINGECTOMY N/A 03/27/2018   Procedure: EXPLORATORY LAPARATOMY, BILATERAL SALPINGOOPHERECTOMY;  Surgeon: Everitt Amber, MD;  Location: [facility];  Service: Gynecology;  Laterality: N/A;  . BREAST EXCISIONAL BIOPSY Left 1995   Benign  . COLONOSCOPY  11-20-2000  . DEBULKING N/A 03/27/2018   Procedure: RADICAL TUMOR DEBULKING;  Surgeon: Everitt Amber, MD;  Location: [facility];  Service: Gynecology;  Laterality: N/A;  . IR IMAGING GUIDED PORT INSERTION  01/17/2018  . NOSE SURGERY  1990  . OMENTECTOMY N/A 03/27/2018   Procedure: OMENTECTOMY;  Surgeon: Everitt Amber, MD;  Location: [facility];  Service: Gynecology;  Laterality: N/A;    SOCIAL HISTORY:  Social History   Socioeconomic History  . Marital status: Married    Spouse name: Not on file  . Number of children: 3  . Years of education: Not on file  . Highest education level: Not on file  Occupational History  . Occupation: retired  Tobacco Use  . Smoking status: Former Smoker    Years: 10.00    Types: Cigars    Quit date: 10/24/2019    Years since quitting: 0.2  . Smokeless tobacco: Never Used  . Tobacco comment: not daily  [place] cigars 1-2   Vaping Use  . Vaping Use: Never used  Substance and Sexual Activity  . Alcohol use: No  Alcohol/week: 0.0 standard drinks  . Drug use: No  . Sexual activity: Not Currently    Comment: 1st intercourse 17yo-1 partner  Other Topics Concern  . Not on file  Social History Narrative  . Not on file   Social Determinants of Health   Financial Resource Strain: Low Risk   . Difficulty of Paying Living Expenses: Not hard at all  Food Insecurity: No Food Insecurity  . Worried About Charity fundraiser in the Last Year: Never true  . Ran Out of Food in the Last Year: Never true  Transportation Needs: No Transportation Needs  . Lack of Transportation (Medical): No  . Lack of Transportation (Non-Medical): No  Physical Activity: Inactive  . Days of  Exercise per Week: 0 days  . Minutes of Exercise per Session: 0 min  Stress: Stress Concern Present  . Feeling of Stress : To some extent  Social Connections: Moderately Isolated  . Frequency of Communication with Friends and Family: More than three times a week  . Frequency of Social Gatherings with Friends and Family: Three times a week  . Attends Religious Services: Never  . Active Member of Clubs or Organizations: No  . Attends Archivist Meetings: Never  . Marital Status: Married  Human resources officer Violence: Not At Risk  . Fear of Current or Ex-Partner: No  . Emotionally Abused: No  . Physically Abused: No  . Sexually Abused: No    FAMILY HISTORY:  Family History  Problem Relation Age of Onset  . Diabetes Daughter   . Psoriasis Daughter   . Heart defect Daughter   . Diabetes Mother   . Hypertension Mother   . Heart disease Mother   . Stroke Maternal Grandmother   . Heart attack Brother   . Heart attack Brother   . Prostatitis Brother   . Lung cancer Niece   . Cancer Paternal Aunt        type of cancer unk  . Prostate cancer Paternal Uncle   . Uterine cancer Cousin        dx under 9  . Colon cancer Neg Hx     CURRENT MEDICATIONS:  Current Outpatient Medications  Medication Sig Dispense Refill  . Artificial Saliva (SALIVASURE) LOZG Use as directed 1 lozenge in the mouth or throat every 4 (four) hours as needed. 90 lozenge 0  . calcium carbonate (OSCAL) 1500 (600 Ca) MG TABS tablet Take 600 mg of elemental calcium by mouth daily.    . cholecalciferol (VITAMIN D) 1000 UNITS tablet Take 1,000 Units by mouth daily.    Marland Kitchen DIGESTIVE ENZYMES PO Take 1 capsule by mouth daily.    Marland Kitchen estradiol (ESTRACE) 0.1 MG/GM vaginal cream Place 1 Applicatorful vaginally 3 (three) times a week. 42.5 g 12  . fluticasone (CUTIVATE) 0.05 % cream APPLY CREAM TOPICALLY UP TO TWICE DAILY AS NEEDED FOR RASH    . fluticasone (FLONASE) 50 MCG/ACT nasal spray Place 2 sprays into both  nostrils daily. 48 g 2  . gabapentin (NEURONTIN) 300 MG capsule Take 1 capsule (300 mg total) by mouth 2 (two) times daily. (Patient taking differently: Take 300 mg by mouth 2 (two) times daily. Taking 1 capsule at bedtime) 60 capsule 3  . HYDROmorphone (DILAUDID) 2 MG tablet Take 1 tablet (2 mg total) by mouth daily as needed for severe pain. 30 tablet 0  . lactose free nutrition (BOOST PLUS) LIQD Take 237 mLs by mouth daily. 10 Can 0  . levothyroxine (SYNTHROID)  75 MCG tablet Take 1 tablet (75 mcg total) by mouth daily. (Patient taking differently: Take 75 mcg by mouth daily. Taking 1 tablet every day except 1/2 tablet on Tues and Thurs) 90 tablet 3  . losartan (COZAAR) 25 MG tablet Take 1 tablet by mouth once daily 90 tablet 0  . Magnesium 400 MG TABS Take 250 mg by mouth daily.    . Menthol-Methyl Salicylate (MUSCLE RUB) 10-15 % CREA Apply 1 application topically as needed for muscle pain.    . metoprolol succinate (TOPROL-XL) 50 MG 24 hr tablet Take 0.5 tablets (25 mg total) by mouth daily. Take with or immediately following a meal. 90 tablet 0  . Multiple Vitamins-Minerals (MULTIVITAMIN WITH MINERALS) tablet Take 1 tablet by mouth daily.    . Omega-3 Fatty Acids (FISH OIL) 1000 MG CAPS Take 2,000 mg by mouth in the morning and at bedtime.    Marland Kitchen omeprazole (PRILOSEC) 40 MG capsule Take 1 capsule (40 mg total) by mouth daily. 90 capsule 3  . Pembrolizumab (KEYTRUDA IV) Inject 200 mg into the vein every 21 ( twenty-one) days.    . polyethylene glycol (MIRALAX / GLYCOLAX) packet Take 17 g by mouth daily. 14 each 0  . senna (SENOKOT) 8.6 MG TABS tablet Take 1 tablet (8.6 mg total) by mouth at bedtime as needed for mild constipation. 120 each 0  . simvastatin (ZOCOR) 20 MG tablet Take 1 tablet (20 mg total) by mouth at bedtime. 90 tablet 1  . tretinoin (RETIN-A) 0.025 % cream APPLY TOPICALLY TO FACE AT BEDTIME AS NEEDED    . vitamin B-12 (CYANOCOBALAMIN) 1000 MCG tablet Take 1,000 mcg by mouth every  other day.    . vitamin C (ASCORBIC ACID) 500 MG tablet Take 1,000 mg by mouth daily.    . vitamin E 45 MG (100 UNITS) capsule Take by mouth daily.    [name] 20 MG TABS tablet TAKE 1 TABLET BY MOUTH ONCE DAILY WITH SUPPER 90 tablet 2  . zinc gluconate 50 MG tablet Take 50 mg by mouth daily.    Marland Kitchen ALPRAZolam (XANAX) 0.5 MG tablet TAKE 1/2 TO 1 (ONE-HALF TO ONE) TABLET BY MOUTH ONCE DAILY AS NEEDED FOR ANXIETY (Patient not taking: Reported on 01/21/2020) 30 tablet 3  . alum & mag hydroxide-simeth (MAALOX/MYLANTA) 200-200-20 MG/5ML suspension Take 30 mLs by mouth every 6 (six) hours as needed for indigestion or heartburn. (Patient not taking: Reported on 01/21/2020) 355 mL 0  . prochlorperazine (COMPAZINE) 10 MG tablet Take 1 tablet (10 mg total) by mouth every 6 (six) hours as needed. 30 tablet 3   No current facility-administered medications for this visit.    ALLERGIES:  Allergies  Allergen Reactions  . Tramadol Hcl     jittery    PHYSICAL EXAM:  Performance status (ECOG): 1 - Symptomatic but completely ambulatory  Vitals:   01/21/20 1016  BP: 106/65  Pulse: 65  Resp: 18  Temp: (!) 97.1 F (36.2 C)  SpO2: 96%   Wt Readings from Last 3 Encounters:  01/21/20 157 lb 6.4 oz (71.4 kg)  12/31/19 157 lb (71.2 kg)  12/24/19 154 lb 6.4 oz (70 kg)   Physical Exam Vitals reviewed.  Constitutional:      Appearance: Normal appearance.  Cardiovascular:     Rate and Rhythm: Normal rate and regular rhythm.     Pulses: Normal pulses.     Heart sounds: Normal heart sounds.  Pulmonary:     Effort: Pulmonary effort is  normal.     Breath sounds: Normal breath sounds.  Chest:  Breasts:     Right: Axillary adenopathy present. No supraclavicular adenopathy.     Left: Axillary adenopathy and supraclavicular adenopathy present.      Comments: Port-a-Cath in R chest Abdominal:     Palpations: Abdomen is soft. There is no mass.     Tenderness: There is abdominal tenderness in the right  lower quadrant and suprapubic area.  Musculoskeletal:     Left upper arm: Swelling (lymphedema of upper arm) present.  Lymphadenopathy:     Upper Body:     Right upper body: Axillary adenopathy present. No supraclavicular adenopathy.     Left upper body: Supraclavicular adenopathy and axillary adenopathy present.  Skin:    Findings: Rash present. Rash is macular (multiple rashes on lower legs and face).  Neurological:     General: No focal deficit present.     Mental Status: She is alert and oriented to person, place, and time.  Psychiatric:        Mood and Affect: Mood normal.        Behavior: Behavior normal.     LABORATORY DATA:  I have reviewed the labs as listed.  CBC Latest Ref Rng & Units 01/21/2020 12/31/2019 12/09/2019  WBC 4.0 - 10.5 K/uL 7.8 6.5 6.5  Hemoglobin 12.0 - 15.0 g/dL 10.9(L) 10.5(L) 11.0(L)  Hematocrit 36.0 - 46.0 % 35.0(L) 33.3(L) 34.7(L)  Platelets 150 - 400 K/uL 223 186 185   CMP Latest Ref Rng & Units 01/21/2020 12/31/2019 12/09/2019  Glucose 70 - 99 mg/dL 92 86 104(H)  BUN 8 - 23 mg/dL $Remove'16 15 14  'sibDRAm$ Creatinine 0.44 - 1.00 mg/dL 0.85 0.81 0.86  Sodium 135 - 145 mmol/L 133(L) 136 135  Potassium 3.5 - 5.1 mmol/L 3.9 3.9 3.3(L)  Chloride 98 - 111 mmol/L 101 103 103  CO2 22 - 32 mmol/L $RemoveB'25 26 25  'KsVFhmWg$ Calcium 8.9 - 10.3 mg/dL 8.9 8.9 8.8(L)  Total Protein 6.5 - 8.1 g/dL 7.3 7.3 7.5  Total Bilirubin 0.3 - 1.2 mg/dL 0.6 0.5 0.6  Alkaline Phos 38 - 126 U/L 75 67 74  AST 15 - 41 U/L 42(H) 27 26  ALT 0 - 44 U/L $Remo'19 17 19   'BMtXR$ Lab Results  Component Value Date   CA125 1,955 (H) 12/31/2019   CA125 1,567 (H) 11/18/2019   CA125 3,004 (H) 01/08/2018    DIAGNOSTIC IMAGING:  I have independently reviewed the scans and discussed with the patient. CT CHEST ABDOMEN PELVIS W CONTRAST  Result Date: 01/17/2020 CLINICAL DATA:  Right ovarian cancer, status post BSO and omentectomy in 2020, chemotherapy and immunotherapy ongoing EXAM: CT CHEST, ABDOMEN, AND PELVIS WITH CONTRAST  TECHNIQUE: Multidetector CT imaging of the chest, abdomen and pelvis was performed following the standard protocol during bolus administration of intravenous contrast. CONTRAST:  135mL OMNIPAQUE IOHEXOL 300 MG/ML  SOLN COMPARISON:  CT chest dated 12/09/2019. CT chest abdomen pelvis dated 10/03/2019. FINDINGS: CT CHEST FINDINGS Cardiovascular: Heart is normal in size.  No pericardial effusion. No evidence of thoracic aortic aneurysm. Atherosclerotic calcifications of the aortic arch. Right chest port terminates at the cavoatrial junction. Mediastinum/Nodes: Bulky axillary lymphadenopathy, measuring up to 2.4 cm short axis, previously 2.2 cm on the right and 2.0 cm on the left. Additional small thoracic nodes, including an 8 mm short axis node at the left thoracic inlet (series 2/image 6), an 8 mm short axis high right paratracheal node, and a 9 mm short axis low  right paratracheal node, mildly progressive. 11 mm short axis right precardial node (series 2/image 42), unchanged. Lungs/Pleura: Mild biapical pleural-parenchymal scarring. No suspicious pulmonary nodules. No focal consolidation. No pleural effusion or pneumothorax. Musculoskeletal: No focal osseous lesions. CT ABDOMEN PELVIS FINDINGS Hepatobiliary: Small left hepatic cysts measuring up to 13 mm (series 2/image 51). Gallbladder is unremarkable. No intrahepatic or extrahepatic ductal dilatation. Pancreas: Within normal limits. Spleen: Within normal limits. Adrenals/Urinary Tract: Adrenal glands are within normal limits. Kidneys are within normal limits.  No hydronephrosis. Thick-walled bladder, although underdistended. Stomach/Bowel: Stomach is notable for a tiny hiatal hernia. No evidence of bowel obstruction. Appendix is not discretely visualized. 3.6 cm serosal implant along the ascending colon (series 2/image 83), previously 2.6 cm. Vascular/Lymphatic: No evidence of abdominal aortic aneurysm. Atherosclerotic calcifications of the abdominal aorta and  branch vessels. Upper abdominal/retroperitoneal lymphadenopathy, including: --19 mm short axis node in the porta hepatis (series 2/image 62), previously 10 mm --13 mm short axis left para-aortic node (series 2/image 63), previously 12 mm Reproductive: Status post hysterectomy. No adnexal masses. Other: Mild loculated fluid in the right pelvis (series 2/image 108), new. Additional ascites along Morrison's pouch (series 2/image 70) and the left pericolic gutter (series 2/image 84), new. Peritoneal disease/omental caking beneath the anterior abdominal wall, new/progressive. Dominant 15 mm implant beneath the right anterior abdominal wall (series 2/image 76). Musculoskeletal: Mild degenerative changes of the lumbar spine. IMPRESSION: Bulky bilateral axillary nodal metastases, mildly progressive. Additional thoracic, upper abdominal, and retroperitoneal nodal metastases, mildly progressive. Dominant serosal implant along the ascending colon, progressive. Additional peritoneal disease/omental caking with abdominopelvic ascites, new/progressive. Aortic Atherosclerosis (ICD10-I70.0). Electronically Signed   By: Julian Hy M.D.   On: 01/17/2020 09:47     ASSESSMENT:  1. Platinum resistant high-grade serous ovarian carcinoma: -Neoadjuvant chemotherapy with 3 cycles of carboplatin and paclitaxel from 01/18/2018 through 03/02/2018 presentation as stage IIIc -03/27/2018-BSO,omentectomy with pathology showing high-grade serous carcinoma, PT3CP NX followed by 3 more cycles of carboplatin and paclitaxel from 04/20/2018 through 05/31/2018. -5 cycles of Doxil and Avastin from 07/16/2018 through 11/14/2018 -Left axillary lymph node biopsy on 04/03/2019 consistent with high-grade serous carcinoma. -Third line therapy with gemcitabine 3 cycles from 04/15/2019 through 06/03/2019. -CT scan of the AP on 06/21/2019 shows bulky left axillary adenopathy greater than the right axillary adenopathy increasing in size, developing nodularity  and/or small lymph nodes along the left hemidiaphragm in the inferior prepericardial fat. Stable small lymph nodes throughout the retroperitoneum. -She was seen by Drs. Alvy Bimler and Dr. Denman George and was given palliative options including chemotherapy with topotecan, docetaxel and pembrolizumab. -PD-L1 CPS score is 6%. -Germline and somatic mutation testing negative on 04/11/2018. -Foundation 1 testing did not show targetable mutations. -CT CAP from 10/03/2019 shows bulky axillary adenopathy enlarged on the right side and stable on the left axilla. Increasing size of the mediastinal lymph nodes. Soft tissue implant along the ascending colon. Enlarging lymph nodes and increasing number of lymph nodes in the upper abdomen. -CA-125 is worsening at 768. -3 cycles of Keytruda from 11/18/2019 through 12/31/2019 with progression. - CT CAP on 01/17/2020 showed mild progression of bulky bilateral axillary lymph node metastasis, upper abdominal, retroperitoneal nodal metastasis.  Dominant serosal implant along the ascending colon progressive.  Additional peritoneal disease/omental caking with ascites new.  2. Social/family history: -Patient smokes half pack per day of cigarettes. -Family history significant for maternal niece with breast cancer.   PLAN:  1. Platinum resistant high-grade serous ovarian carcinoma: -Her CA125 increased to 1955. - Reviewed results  of CT CAP and images with the patient which showed progression. - Recommended discontinuation of Keytruda.  Discussed various options including further chemotherapy with topotecan versus best supportive care in the form of hospice. - Patient would like to proceed with topotecan and see how she tolerates it. - We discussed the side effects in detail. - We will start with 5-day regimen every 21 days. - Tentatively will start next Monday. - Reviewed labs from today which showed normal white count and platelet count.  Hemoglobin is 10.9.  AST is mildly  elevated at 42.  2.  Nausea: - Take Compazine 10 mg every 6 hours as needed.  3.  Hypothyroidism: - TSH today is elevated at 7.5.  Previously it was 3.3. - I would not start her on Synthroid yet as we are stopping Keytruda. - Will check TSH in few weeks.   Orders placed this encounter:  Orders Placed This Encounter  Procedures  . CBC with Differential/Platelet  . Comprehensive metabolic panel  . TSH  . CA 125   Total time spent is 40 minutes with more than 50% of the time spent face-to-face discussing scan results and images, change in treatment plan, side effects, counseling and coordination of care.  Derek Jack, MD [place] [phone]   I, Milinda Antis, am acting as a scribe for Dr. Sanda Linger.  I, Derek Jack MD, have reviewed the above documentation for accuracy and completeness, and I agree with the above.

## 2020-01-21 NOTE — Progress Notes (Signed)
DISCONTINUE OFF PATHWAY REGIMEN - Ovarian   [id number]:Pembrolizumab 200 mg IV D1 q21 Days:   A cycle is every 21 days:     Pembrolizumab   **Always confirm dose/schedule in your pharmacy ordering system**  REASON: Disease Progression PRIOR TREATMENT: Off Pathway: Pembrolizumab 200 mg IV D1 q21 Days TREATMENT RESPONSE: Progressive Disease (PD)  START ON PATHWAY REGIMEN - Ovarian     A cycle is every 21 days:     Topotecan   **Always confirm dose/schedule in your pharmacy ordering system**  Patient Characteristics: Recurrent or Progressive Disease, Fourth Line and Beyond, BRCA Mutation Absent BRCA Mutation Status: Absent Therapeutic Status: Recurrent or Progressive Disease Line of Therapy: Fourth Line and Beyond  Intent of Therapy: Non-Curative / Palliative Intent, Discussed with Patient

## 2020-01-21 NOTE — Progress Notes (Signed)
Patients port flushed without difficulty.  Good blood return noted with no bruising or swelling noted at site.  Transparent dressing applied.  Patient remains accessed for chemotherapy treatment.  

## 2020-01-21 NOTE — Patient Instructions (Signed)
[facility] Discharge Instructions for Patients Receiving Chemotherapy  Today you received the following chemotherapy agents   To help prevent nausea and vomiting after your treatment, we encourage you to take your nausea medication   If you develop nausea and vomiting that is not controlled by your nausea medication, call the clinic.   BELOW ARE SYMPTOMS THAT SHOULD BE REPORTED IMMEDIATELY:  *FEVER GREATER THAN 100.5 F  *CHILLS WITH OR WITHOUT FEVER  NAUSEA AND VOMITING THAT IS NOT CONTROLLED WITH YOUR NAUSEA MEDICATION  *UNUSUAL SHORTNESS OF BREATH  *UNUSUAL BRUISING OR BLEEDING  TENDERNESS IN MOUTH AND THROAT WITH OR WITHOUT PRESENCE OF ULCERS  *URINARY PROBLEMS  *BOWEL PROBLEMS  UNUSUAL RASH Items with * indicate a potential emergency and should be followed up as soon as possible.  Feel free to call the clinic should you have any questions or concerns. The clinic phone number is [phone].  Please show the CHEMO ALERT CARD at check-in to the Emergency Department and triage nurse.   

## 2020-01-21 NOTE — Progress Notes (Signed)
Patient assessed and labs reviewed by Dr. Delton Coombes. No treatment today Dr. Raliegh Ip is changing therapy. Primary RN aware.

## 2020-01-21 NOTE — Progress Notes (Signed)
Patient presents today for Keytruda infusion.  Vital signs within parameters for treatment.  Labs pending.  Patients only complaint today is that she has had nausea.  Nausea is relieved with medication.  No other complaints since last visit.  Labs within parameters for treatment.   No treatment given today per MD orders. Vital signs stable.  No complaints at this time.  Discharge from clinic ambulatory in stable condition.  Alert and oriented X 3.  Follow up with [facility] as scheduled.

## 2020-01-21 NOTE — Patient Instructions (Addendum)
[place] at [facility] Discharge Instructions  You were seen today by Dr. Delton Coombes. He went over your recent results and scans; your lymph nodes have grown slightly and new lesions have appeared in the lining of the abdomen. You will be started on chemo, called topotecan, which is given through the port for 5 days in a row every 3 weeks. You will be prescribed Compazine 10 mg to take every 6 hours as needed for nausea. Apply a moisturizing lotion on the red spots of your legs and face to keep the skin moisturized. Dr. Delton Coombes will see you back in 4 week for labs and follow up.   Thank you for choosing [place] at [facility] to provide your oncology and hematology care.  To afford each patient quality time with our provider, please arrive at least 15 minutes before your scheduled appointment time.   If you have a lab appointment with the [facility] please come in thru the Main Entrance and check in at the main information desk  You need to re-schedule your appointment should you arrive 10 or more minutes late.  We strive to give you quality time with our providers, and arriving late affects you and other patients whose appointments are after yours.  Also, if you no show three or more times for appointments you may be dismissed from the clinic at the providers discretion.     Again, thank you for choosing [facility].  Our hope is that these requests will decrease the amount of time that you wait before being seen by our physicians.       _____________________________________________________________  Should you have questions after your visit to [facility], please contact our office at [phone] between the hours of 8:00 a.m. and 4:30 p.m.  Voicemails left after 4:00 p.m. will not be returned until the following business day.  For prescription refill requests, have your pharmacy contact our office and allow 72  hours.    Cancer Center Support Programs:   > Cancer Support Group  2nd Tuesday of the month 1pm-2pm, Journey Room

## 2020-01-22 ENCOUNTER — Other Ambulatory Visit: Payer: Self-pay

## 2020-01-22 ENCOUNTER — Encounter: Payer: Self-pay | Admitting: Family Medicine

## 2020-01-22 ENCOUNTER — Ambulatory Visit (INDEPENDENT_AMBULATORY_CARE_PROVIDER_SITE_OTHER): Payer: PPO | Admitting: Family Medicine

## 2020-01-22 VITALS — BP 106/70 | HR 74 | Resp 16 | Ht 62.0 in | Wt 157.0 lb

## 2020-01-22 DIAGNOSIS — I480 Paroxysmal atrial fibrillation: Secondary | ICD-10-CM | POA: Diagnosis not present

## 2020-01-22 DIAGNOSIS — I700 Atherosclerosis of aorta: Secondary | ICD-10-CM | POA: Diagnosis not present

## 2020-01-22 DIAGNOSIS — I10 Essential (primary) hypertension: Secondary | ICD-10-CM

## 2020-01-22 DIAGNOSIS — Z7189 Other specified counseling: Secondary | ICD-10-CM | POA: Diagnosis not present

## 2020-01-22 DIAGNOSIS — T451X5A Adverse effect of antineoplastic and immunosuppressive drugs, initial encounter: Secondary | ICD-10-CM | POA: Diagnosis not present

## 2020-01-22 DIAGNOSIS — K219 Gastro-esophageal reflux disease without esophagitis: Secondary | ICD-10-CM

## 2020-01-22 DIAGNOSIS — N3941 Urge incontinence: Secondary | ICD-10-CM

## 2020-01-22 DIAGNOSIS — C786 Secondary malignant neoplasm of retroperitoneum and peritoneum: Secondary | ICD-10-CM | POA: Diagnosis not present

## 2020-01-22 DIAGNOSIS — G620 Drug-induced polyneuropathy: Secondary | ICD-10-CM

## 2020-01-22 DIAGNOSIS — K130 Diseases of lips: Secondary | ICD-10-CM

## 2020-01-22 LAB — CA 125: Cancer Antigen (CA) 125: 5404 U/mL — ABNORMAL HIGH (ref 0.0–38.1)

## 2020-01-22 MED ORDER — DESONIDE 0.05 % EX OINT: 1.0000 "application " | TOPICAL_OINTMENT | Freq: Two times a day (BID) | CUTANEOUS | 1 refills | Status: DC

## 2020-01-22 MED ORDER — DESONIDE 0.05 % EX OINT
1.0000 | TOPICAL_OINTMENT | Freq: Two times a day (BID) | CUTANEOUS | 1 refills | Status: AC
Start: 2020-01-22 — End: ?

## 2020-01-22 MED ORDER — METOPROLOL SUCCINATE ER 50 MG PO TB24: 25.0000 mg | ORAL_TABLET | Freq: Every day | ORAL | 0 refills | Status: AC

## 2020-01-22 NOTE — Assessment & Plan Note (Signed)
Incidental finding on imaging. Currently she is on simvastatin 20 mg daily.

## 2020-01-22 NOTE — Assessment & Plan Note (Signed)
Today rhythm and rate controlled. Continue metoprolol succinate 25 mg daily and Xarelto 20 mg daily. Following with cardiologist.

## 2020-01-22 NOTE — Assessment & Plan Note (Addendum)
Problem has improved after dietary changes but still not well controlled. She would prefer to continue omeprazole 40 mg daily. Continue GERD precautions. Dysphagia she is reporting today could be related to this problem.

## 2020-01-22 NOTE — Patient Instructions (Addendum)
A few things to remember from today's visit:  If you need refills please call your pharmacy. Do not use My Chart to request refills or for acute issues that need immediate attention.   If you want to continue Omeprazole , you can do so. Let me know if you decide to try Protonix. Certain foods can make problem worse.  New medication can aggravate anemia,nausea,and fatigue. You need to have a discussion with family about end of life wishes, quality of life.  Please be sure medication list is accurate. If a new problem present, please set up appointment sooner than planned today.  Caution with swallowing certain foods.  Small amount of topical steroid on affected area, around lips.  End-of-Life Care End-of-life care is the physical, emotional, mental, and spiritual care that a person receives during the last days, weeks, or months of life. End-of-life care requires a team of professionals, such as:  Health care providers.  A Education officer, museum or mental health provider.  A spiritual adviser. The goal of this care is to give the person the best quality of life possible at the end of his or her life. What are the different types of end-of-life care? There are different options for receiving care at the end of your life. Palliative care Palliative care can be given at the same time as other treatments. The goal is to manage your symptoms and improve your quality of life. This may include:  Steps to control pain and other symptoms and to provide comfort.  Family support.  Spiritual support.  Emotional and social support. You may need palliative care for months or years to manage a long-term (chronic) disease or condition. Hospice care Hospice care is a kind of end-of-life care that may be recommended by your health care providers. This type of care is usually offered when a person is expected to live for 6 months or less.  Hospice care aims to provide people who are terminally ill and  their families with medical, spiritual, and psychological support.  The goal is to improve your quality of life by keeping you as comfortable as possible in the final stages of life. Comfort care Comfort care is a type of care that aims to help meet your basic needs and maintain your overall comfort at the end of your life. This includes:  Caring for your skin.  Making sure that you are breathing well.  Making sure that you get enough rest.  Making sure that you are at a comfortable body temperature. A plan for comfort care can also address the mental, emotional, and spiritual issues that may come up at the end of your life. Where does end-of-life care take place? End-of-life care can take place wherever you are living, as long as you get the care you need. End-of-life care can happen:  At your home.  In a nursing home.  In a hospital.  In a special care facility, such as a hospice house. You and your loved ones may be able to decide where end-of-life care takes place. This decision depends on:  Your wishes.  Your comfort.  The medical equipment and physical assistance you need.  Your loved one's ability to manage your care. How do I know when it is time for end-of-life care? Your health care provider may tell you that treatments can no longer control your illness. You may also decide that you do not want to have the treatments that are available. Talk to your health care provider and  your loved ones about your end-of-life care preferences. What should I plan as part of end-of-life care? If possible, discuss the following topics with your health care provider before you need end-of-life care:  How much medical treatment you want during end-of-life care.  Where you would like to live during end-of-life care.  What kinds of treatments you would like to keep you comfortable.  Which treatments you would refuse.  Your faith or spiritual needs at the end of your life.  Who  will handle practical details, such as your will, finances, and funeral planning. What legal documents do I need to create? You can create legal documents, called advance directives, to let your loved ones know your wishes for end-of-life care. This may include talking to your health care provider or a lawyer about making a living will. A living will is a document that explains your medical wishes. You can also have a medical power of attorney. This designates a person to make health decisions for you if you cannot make them yourself. Where to find more information  [employer] on Aging: http://kim-miller.com/  National Hospice and Palliative Care Organization: http://www.brown-buchanan.com/ Summary  End-of-life care is the physical, emotional, mental, and spiritual care that a person receives during the last days, weeks, or months of life.  The goal of end-of-life care is to give the person the best quality of life possible at the end of his or her life.  There are several options for receiving this care, including palliative care, hospice care, and comfort care.  Talk to your health care provider and your loved ones about your preferences for end-of-life care. This includes the place to receive care, the kind of care you want to receive, the care you want to refuse, your spiritual needs, and your finances.  You can create legal documents, called advance directives,to let your loved ones know your wishes for end-of-life care. This information is not intended to replace advice given to you by your health care provider. Make sure you discuss any questions you have with your health care provider. Document Revised: 10/01/2019 Document Reviewed: 10/01/2019 Elsevier Patient Education  2021 [employer].

## 2020-01-22 NOTE — Assessment & Plan Note (Signed)
Probably has been adequately controlled. Continue gabapentin 300 mg twice daily.

## 2020-01-22 NOTE — Progress Notes (Signed)
Chief Complaint  Patient presents with  . Gastroesophageal Reflux   HPI: [name] is a 75 y.o. female, who is here today complaining of a month of intermittent heartburn  This is a new problem. Fudge chocolate aggravated problem. She was also drinking stronger coffee. Getting "chocked" with food, needs to be careful when talking and eating at the same time. It happens with some solids, has been going on for a few months but seems to be getting worse.   Symptoms improved after decreasing coffee intake.  OTC Mylanta helped.  Bloating sensation, which has been going on for several months and stable. Constipation last month, she is now back to her normal bowel habits. She is on Hydromorphone 2 mg daily as needed for abdominal pain. + Nausea, attributed to chemotherapy.  She is on Omeprazole 40 mg daily.  "Bladder issues", states that urine "just runs."  PT/pelvic floor exercise has helped in the past. She was evaluated by urologist and according to pt,"acupuncture shot" was offered. Negative for dysuria,increased urinary frequency, gross hematuria,or decreased urine output.  -Concerned about anemia and abdominal distention.  Right ovarian cancer with metastasis. She follows with oncologist. She just completed chemo with [facility] and planning on starting infusions of Topotecan Monday.  She has not noted blood in stool or melena.  Lab Results  Component Value Date   WBC 7.8 01/21/2020   HGB 10.9 (L) 01/21/2020   HCT 35.0 (L) 01/21/2020   MCV 94.1 01/21/2020   PLT 223 01/21/2020   She wonders if she needs to start iron supplementation.  Recent abdominal/pelvic CT: Bulky bilateral axillary nodal metastases, mildly progressive. Additional thoracic, upper abdominal, and retroperitoneal nodal metastases, mildly progressive. Dominant serosal implant along the ascending colon, progressive.Additional peritoneal disease/omental caking with abdominopelvic ascites,  new/progressive. Aortic Atherosclerosis (ICD10-I70.0).  -Chemo-induced neuropathy: She is on Gabapentin 300 mg bid, which has helped.  -Also concerned about dryness and tender angular/lips lesions. She has had problem intermittently for years but seems to be getting worse. She has not identified exacerbating or alleviating factors. OTC lip balms used to help.  Hypertension: She is on losartan 25 mg daily. Atrial fibrillation: Currently she is on Xarelto 20 mg daily and metoprolol succinate 50 mg, 1/2 tablet daily.  Aortic atherosclerosis has been seen on imaging. Hyperlipidemia: Currently she is on Symbicort 20 mg daily.  Review of Systems  Constitutional: Positive for activity change, appetite change and fatigue. Negative for fever.  HENT: Negative for mouth sores, nosebleeds and sore throat.   Respiratory: Negative for cough, shortness of breath and wheezing.   Cardiovascular: Negative for chest pain, palpitations and leg swelling.  Gastrointestinal: Positive for abdominal pain and nausea. Negative for vomiting.  Neurological: Negative for syncope and headaches.  Rest see pertinent positives and negatives per HPI.  Current Outpatient Medications on File Prior to Visit  Medication Sig Dispense Refill  . Artificial Saliva (SALIVASURE) LOZG Use as directed 1 lozenge in the mouth or throat every 4 (four) hours as needed. 90 lozenge 0  . calcium carbonate (OSCAL) 1500 (600 Ca) MG TABS tablet Take 600 mg of elemental calcium by mouth daily.    . cholecalciferol (VITAMIN D) 1000 UNITS tablet Take 1,000 Units by mouth daily.    Marland Kitchen DIGESTIVE ENZYMES PO Take 1 capsule by mouth daily.    Marland Kitchen estradiol (ESTRACE) 0.1 MG/GM vaginal cream Place 1 Applicatorful vaginally 3 (three) times a week. 42.5 g 12  . fluticasone (CUTIVATE) 0.05 % cream APPLY CREAM  TOPICALLY UP TO TWICE DAILY AS NEEDED FOR RASH    . fluticasone (FLONASE) 50 MCG/ACT nasal spray Place 2 sprays into both nostrils daily. 48 g 2  .  gabapentin (NEURONTIN) 300 MG capsule Take 1 capsule (300 mg total) by mouth 2 (two) times daily. (Patient taking differently: Take 300 mg by mouth 2 (two) times daily. Taking 1 capsule at bedtime) 60 capsule 3  . HYDROmorphone (DILAUDID) 2 MG tablet Take 1 tablet (2 mg total) by mouth daily as needed for severe pain. 30 tablet 0  . lactose free nutrition (BOOST PLUS) LIQD Take 237 mLs by mouth daily. 10 Can 0  . levothyroxine (SYNTHROID) 75 MCG tablet Take 1 tablet (75 mcg total) by mouth daily. (Patient taking differently: Take 75 mcg by mouth daily. Taking 1 tablet every day except 1/2 tablet on Tues and Thurs) 90 tablet 3  . losartan (COZAAR) 25 MG tablet Take 1 tablet by mouth once daily 90 tablet 0  . Magnesium 400 MG TABS Take 250 mg by mouth daily.    . Menthol-Methyl Salicylate (MUSCLE RUB) 10-15 % CREA Apply 1 application topically as needed for muscle pain.    . Multiple Vitamins-Minerals (MULTIVITAMIN WITH MINERALS) tablet Take 1 tablet by mouth daily.    . Omega-3 Fatty Acids (FISH OIL) 1000 MG CAPS Take 2,000 mg by mouth in the morning and at bedtime.    Marland Kitchen omeprazole (PRILOSEC) 40 MG capsule Take 1 capsule (40 mg total) by mouth daily. 90 capsule 3  . polyethylene glycol (MIRALAX / GLYCOLAX) packet Take 17 g by mouth daily. 14 each 0  . prochlorperazine (COMPAZINE) 10 MG tablet Take 1 tablet (10 mg total) by mouth every 6 (six) hours as needed. 30 tablet 3  . senna (SENOKOT) 8.6 MG TABS tablet Take 1 tablet (8.6 mg total) by mouth at bedtime as needed for mild constipation. 120 each 0  . simvastatin (ZOCOR) 20 MG tablet Take 1 tablet (20 mg total) by mouth at bedtime. 90 tablet 1  . tretinoin (RETIN-A) 0.025 % cream APPLY TOPICALLY TO FACE AT BEDTIME AS NEEDED    . vitamin B-12 (CYANOCOBALAMIN) 1000 MCG tablet Take 1,000 mcg by mouth every other day.    . vitamin C (ASCORBIC ACID) 500 MG tablet Take 1,000 mg by mouth daily.    . vitamin E 45 MG (100 UNITS) capsule Take by mouth daily.     [name] 20 MG TABS tablet TAKE 1 TABLET BY MOUTH ONCE DAILY WITH SUPPER 90 tablet 2  . zinc gluconate 50 MG tablet Take 50 mg by mouth daily.    Marland Kitchen ALPRAZolam (XANAX) 0.5 MG tablet TAKE 1/2 TO 1 (ONE-HALF TO ONE) TABLET BY MOUTH ONCE DAILY AS NEEDED FOR ANXIETY (Patient not taking: No sig reported) 30 tablet 3  . alum & mag hydroxide-simeth (MAALOX/MYLANTA) 200-200-20 MG/5ML suspension Take 30 mLs by mouth every 6 (six) hours as needed for indigestion or heartburn. (Patient not taking: No sig reported) 355 mL 0   No current facility-administered medications on file prior to visit.    Past Medical History:  Diagnosis Date  . Allergy    SEASONAL  . Anxiety   . Cataract    BILATERAL  . Dysrhythmia   . Family history of lung cancer   . Family history of prostate cancer   . Family history of prostate cancer   . Family history of uterine cancer   . Fibromyalgia   . GERD (gastroesophageal reflux disease)   .  H/O echocardiogram 04/28/08   EF>55% trace mitral regurgitation, No significant valvular pathology  . Hemorrhoids    internal  . History of stress test 02/08/2011   Normal Myocardial perfusion study, this is a low risk scan, No prior study available for comparison  . Hyperlipidemia   . Hypertension    hx of  . Hypothyroidism   . MVP (mitral valve prolapse)    mild and MR  . OA (osteoarthritis)   . Osteoporosis   . ovarian ca dx'd 12/2017   ovarian cancer  . Paroxysmal A-fib ([place])   . Sleep apnea    does not use prescribed CPAP  does not tolerate  . Thyroid disease    HYPO  . Varicosities of leg   . Vitamin D deficiency    Allergies  Allergen Reactions  . Tramadol Hcl     jittery    Social History   Socioeconomic History  . Marital status: Married    Spouse name: Not on file  . Number of children: 3  . Years of education: Not on file  . Highest education level: Not on file  Occupational History  . Occupation: retired  Tobacco Use  . Smoking status:  Former Smoker    Years: 10.00    Types: Cigars    Quit date: 10/24/2019    Years since quitting: 0.2  . Smokeless tobacco: Never Used  . Tobacco comment: not daily  [place] cigars 1-2   Vaping Use  . Vaping Use: Never used  Substance and Sexual Activity  . Alcohol use: No    Alcohol/week: 0.0 standard drinks  . Drug use: No  . Sexual activity: Not Currently    Comment: 1st intercourse 17yo-1 partner  Other Topics Concern  . Not on file  Social History Narrative  . Not on file   Social Determinants of Health   Financial Resource Strain: Low Risk   . Difficulty of Paying Living Expenses: Not hard at all  Food Insecurity: No Food Insecurity  . Worried About Charity fundraiser in the Last Year: Never true  . Ran Out of Food in the Last Year: Never true  Transportation Needs: No Transportation Needs  . Lack of Transportation (Medical): No  . Lack of Transportation (Non-Medical): No  Physical Activity: Inactive  . Days of Exercise per Week: 0 days  . Minutes of Exercise per Session: 0 min  Stress: Stress Concern Present  . Feeling of Stress : To some extent  Social Connections: Moderately Isolated  . Frequency of Communication with Friends and Family: More than three times a week  . Frequency of Social Gatherings with Friends and Family: Three times a week  . Attends Religious Services: Never  . Active Member of Clubs or Organizations: No  . Attends Archivist Meetings: Never  . Marital Status: Married    Vitals:   01/22/20 1029  BP: 106/70  Pulse: 74  Resp: 16  SpO2: 94%   Body mass index is 28.72 kg/m.  Physical Exam Vitals and nursing note reviewed.  Constitutional:      General: She is not in acute distress.    Appearance: She is well-developed.  HENT:     Head: Normocephalic and atraumatic.     Mouth/Throat:     Mouth: Oropharynx is clear and moist. Mucous membranes are dry.      Comments: Dry,scaly,and mildly erythematous area around corners  of lips. No vesicular lesion, induration,or tenderness. Eyes:     Conjunctiva/sclera:  Conjunctivae normal.  Cardiovascular:     Rate and Rhythm: Normal rate and regular rhythm.     Pulses:          Dorsalis pedis pulses are 2+ on the right side and 2+ on the left side.     Heart sounds: No murmur heard.   Pulmonary:     Effort: Pulmonary effort is normal. No respiratory distress.     Breath sounds: Normal breath sounds.  Abdominal:     Palpations: Abdomen is soft. There is no hepatomegaly or mass.     Tenderness: There is no abdominal tenderness.  Musculoskeletal:        General: No edema.  Lymphadenopathy:     Cervical: No cervical adenopathy.  Skin:    General: Skin is warm.     Findings: No abrasion or rash.  Neurological:     Mental Status: She is alert and oriented to person, place, and time.     Cranial Nerves: No cranial nerve deficit.     Gait: Gait normal.     Deep Tendon Reflexes: Strength normal.  Psychiatric:        Mood and Affect: Mood and affect normal.     Comments: Well groomed, good eye contact.    ASSESSMENT AND PLAN:  Ms.Shallyn was seen today for gastroesophageal reflux.  Diagnoses and all orders for this visit:  Gastroesophageal reflux disease, unspecified whether esophagitis present Improved after dietary changes.  Dysphagia could be a related problem.  She prefers to continue Omeprazole 40 mg instead changing to Protonix. Continue GERD precautions.  Aortic atherosclerosis ([place]) She is on Simvastatin 20 mg daily and on Xorelto.  Secondary malignant neoplasm of retroperitoneum and peritoneum [facility]) Following with onomatology.  Peripheral neuropathy due to chemotherapy [facility]) Well controlled. Gabapentin 300 mg bid to continue .  PAF (paroxysmal atrial fibrillation) (HCC) Rate and rhythm controlled. Continue Xorelto 20 mg daily and Metoprolol succinate 25 mg daily. Following with cardiologist.  Counseling regarding end of life decision  making We discussed abdominal/pelvic CT, findings suggest progression. Reviewed with her some side effects of Keytruda and Topatecan.  She is concerned about side effects of chemo agents. Long discussion about goals of care and quality of life. We could stop some of supplements and stain, she prefers not to do so for now.  She has been evaluated by hospice but she chose to hold service for now. Continue Hydromorphone 2 mg daily prn and Alprazolam 0.5 mg daily prn.  Strongly recommend addressing her concerns in regard to chemo efficacy,prognosis,and side effects with Dr Delton Coombes.  Angular cheilitis Educated about Dx and prognosis, Could have been aggravated by medications. Avoid licking area and continue OTC lip balms.  -     desonide (DESOWEN) 0.05 % ointment; Apply 1 application topically 2 (two) times daily. For up to 2 weeks.  Urgency incontinence She prefers to hold on PT/pelvic floor exercises. She thinks she has forms at home with exercises, so she can start pelvic floor/kegel exercises at home. Following with urologist.  Essential hypertension, benign BP on lower normal range. For now continue Losartan 25 mg and Metoprolol succinate 50 mg 1/2 tab daily,  Spent 63 minutes with pt.  During this time history was obtained and documented, examination was performed, prior labs/imaging reviewed, and assessment/plan discussed. About 40-45 min were dedicated to discussion of goal of care/advance health directives. Some additional information given on AVS.  Return if symptoms worsen or fail to improve.   Kerisha G. Martinique, MD  [place]. [place] office.   A few things to remember from today's visit:  If you need refills please call your pharmacy. Do not use My Chart to request refills or for acute issues that need immediate attention.   If you want to continue Omeprazole , you can do so. Let me know if you decide to try Protonix. Certain foods can make problem  worse.  New medication can aggravate anemia,nausea,and fatigue. You need to have a discussion with family about end of life wishes, quality of life.  Please be sure medication list is accurate. If a new problem present, please set up appointment sooner than planned today.  Caution with swallowing certain foods.  Small amount of topical steroid on affected area, around lips.  End-of-Life Care End-of-life care is the physical, emotional, mental, and spiritual care that a person receives during the last days, weeks, or months of life. End-of-life care requires a team of professionals, such as:  Health care providers.  A Education officer, museum or mental health provider.  A spiritual adviser. The goal of this care is to give the person the best quality of life possible at the end of his or her life. What are the different types of end-of-life care? There are different options for receiving care at the end of your life. Palliative care Palliative care can be given at the same time as other treatments. The goal is to manage your symptoms and improve your quality of life. This may include:  Steps to control pain and other symptoms and to provide comfort.  Family support.  Spiritual support.  Emotional and social support. You may need palliative care for months or years to manage a long-term (chronic) disease or condition. Hospice care Hospice care is a kind of end-of-life care that may be recommended by your health care providers. This type of care is usually offered when a person is expected to live for 6 months or less.  Hospice care aims to provide people who are terminally ill and their families with medical, spiritual, and psychological support.  The goal is to improve your quality of life by keeping you as comfortable as possible in the final stages of life. Comfort care Comfort care is a type of care that aims to help meet your basic needs and maintain your overall comfort at the end  of your life. This includes:  Caring for your skin.  Making sure that you are breathing well.  Making sure that you get enough rest.  Making sure that you are at a comfortable body temperature. A plan for comfort care can also address the mental, emotional, and spiritual issues that may come up at the end of your life. Where does end-of-life care take place? End-of-life care can take place wherever you are living, as long as you get the care you need. End-of-life care can happen:  At your home.  In a nursing home.  In a hospital.  In a special care facility, such as a hospice house. You and your loved ones may be able to decide where end-of-life care takes place. This decision depends on:  Your wishes.  Your comfort.  The medical equipment and physical assistance you need.  Your loved one's ability to manage your care. How do I know when it is time for end-of-life care? Your health care provider may tell you that treatments can no longer control your illness. You may also decide that you do not want to have the treatments that are  available. Talk to your health care provider and your loved ones about your end-of-life care preferences. What should I plan as part of end-of-life care? If possible, discuss the following topics with your health care provider before you need end-of-life care:  How much medical treatment you want during end-of-life care.  Where you would like to live during end-of-life care.  What kinds of treatments you would like to keep you comfortable.  Which treatments you would refuse.  Your faith or spiritual needs at the end of your life.  Who will handle practical details, such as your will, finances, and funeral planning. What legal documents do I need to create? You can create legal documents, called advance directives, to let your loved ones know your wishes for end-of-life care. This may include talking to your health care provider or a lawyer  about making a living will. A living will is a document that explains your medical wishes. You can also have a medical power of attorney. This designates a person to make health decisions for you if you cannot make them yourself. Where to find more information  [employer] on Aging: http://kim-miller.com/  National Hospice and Palliative Care Organization: http://www.brown-buchanan.com/ Summary  End-of-life care is the physical, emotional, mental, and spiritual care that a person receives during the last days, weeks, or months of life.  The goal of end-of-life care is to give the person the best quality of life possible at the end of his or her life.  There are several options for receiving this care, including palliative care, hospice care, and comfort care.  Talk to your health care provider and your loved ones about your preferences for end-of-life care. This includes the place to receive care, the kind of care you want to receive, the care you want to refuse, your spiritual needs, and your finances.  You can create legal documents, called advance directives,to let your loved ones know your wishes for end-of-life care. This information is not intended to replace advice given to you by your health care provider. Make sure you discuss any questions you have with your health care provider. Document Revised: 10/01/2019 Document Reviewed: 10/01/2019 Elsevier Patient Education  2021 [employer].

## 2020-01-22 NOTE — Assessment & Plan Note (Signed)
For now she would like to hold on referral for PT, she feels she can do at home.  She will let me know if she would like PT referral placed.

## 2020-01-22 NOTE — Assessment & Plan Note (Signed)
BP adequately controlled. Continue losartan 25 mg daily and metoprolol succinate 50 mg 1/2 tablet daily.

## 2020-01-24 ENCOUNTER — Telehealth: Payer: Self-pay | Admitting: Family Medicine

## 2020-01-24 NOTE — Telephone Encounter (Signed)
Patient is calling and requesting a refill for HYDROmorphone (DILAUDID) 2 MG tablet sent to [facility]  [address] [place] [address]  Phone:  [phone] Fax:  [phone]  CB is [phone]

## 2020-01-27 ENCOUNTER — Other Ambulatory Visit (HOSPITAL_COMMUNITY): Payer: PPO

## 2020-01-27 ENCOUNTER — Ambulatory Visit (HOSPITAL_COMMUNITY): Payer: PPO

## 2020-01-28 ENCOUNTER — Ambulatory Visit (HOSPITAL_COMMUNITY): Payer: PPO

## 2020-01-28 ENCOUNTER — Ambulatory Visit (HOSPITAL_COMMUNITY): Payer: PPO | Admitting: Hematology

## 2020-01-29 ENCOUNTER — Ambulatory Visit (HOSPITAL_COMMUNITY): Payer: PPO

## 2020-01-30 ENCOUNTER — Ambulatory Visit (HOSPITAL_COMMUNITY): Payer: PPO

## 2020-01-30 ENCOUNTER — Inpatient Hospital Stay (HOSPITAL_COMMUNITY): Payer: PPO | Admitting: Hematology

## 2020-01-30 ENCOUNTER — Other Ambulatory Visit: Payer: Self-pay

## 2020-01-30 ENCOUNTER — Encounter (HOSPITAL_COMMUNITY): Payer: Self-pay | Admitting: Hematology

## 2020-01-30 VITALS — BP 137/57 | HR 58 | Temp 97.7°F | Resp 19 | Wt 160.6 lb

## 2020-01-30 DIAGNOSIS — C561 Malignant neoplasm of right ovary: Secondary | ICD-10-CM | POA: Diagnosis not present

## 2020-01-30 NOTE — Patient Instructions (Addendum)
Hi Izora Gala,  It was so lovely to get to meet you over the phone! Keep up the good work with taking care of yourself through checking your blood pressure regularly, taking your medications as prescribed and following a healthy diet. Below is a summary of some of the topics we discussed.   Please call me if you have any questions or need anything before our follow up!  Best, Maddie  Jeni Salles, PharmD [facility] Clinical Pharmacist [place] at [place]   Visit Information  Goals Addressed            This Visit's Progress   . Pharmacy Care Plan         Ms. Badley was given information about Chronic Care Management services today including:  1. CCM service includes personalized support from designated clinical staff supervised by her physician, including individualized plan of care and coordination with other care providers 2. 24/7 contact phone numbers for assistance for urgent and routine care needs. 3. Standard insurance, coinsurance, copays and deductibles apply for chronic care management only during months in which we provide at least 20 minutes of these services. Most insurances cover these services at 100%, however patients may be responsible for any copay, coinsurance and/or deductible if applicable. This service may help you avoid the need for more expensive face-to-face services. 4. Only one practitioner may furnish and bill the service in a calendar month. 5. The patient may stop CCM services at any time (effective at the end of the month) by phone call to the office staff.  Patient agreed to services and verbal consent obtained.   The patient verbalized understanding of instructions, educational materials, and care plan provided today and agreed to receive a mailed copy of patient instructions, educational materials, and care plan.  Telephone follow up appointment with pharmacy team member scheduled for: 3 months  Viona Gilmore, [facility]

## 2020-01-30 NOTE — Patient Instructions (Signed)
[place] at [facility] Discharge Instructions  You were seen today by Dr. Delton Coombes. He went over your recent results and scans. If you proceed with chemo, the medication is topotecan given 5 days in a row every 3 weeks. If you do not pursue treatments, you can enroll in palliative care to have all of your home needs addressed. Dr. Delton Coombes will see you back as needed.   Thank you for choosing [place] at [facility] to provide your oncology and hematology care.  To afford each patient quality time with our provider, please arrive at least 15 minutes before your scheduled appointment time.   If you have a lab appointment with the [facility] please come in thru the Main Entrance and check in at the main information desk  You need to re-schedule your appointment should you arrive 10 or more minutes late.  We strive to give you quality time with our providers, and arriving late affects you and other patients whose appointments are after yours.  Also, if you no show three or more times for appointments you may be dismissed from the clinic at the providers discretion.     Again, thank you for choosing [facility].  Our hope is that these requests will decrease the amount of time that you wait before being seen by our physicians.       _____________________________________________________________  Should you have questions after your visit to [facility], please contact our office at [phone] between the hours of 8:00 a.m. and 4:30 p.m.  Voicemails left after 4:00 p.m. will not be returned until the following business day.  For prescription refill requests, have your pharmacy contact our office and allow 72 hours.    Cancer Center Support Programs:   > Cancer Support Group  2nd Tuesday of the month 1pm-2pm, Journey Room

## 2020-01-30 NOTE — Progress Notes (Signed)
[place], [place] [address]   CLINIC:  Medical Oncology/Hematology  PCP:  [place], Haroldine G, MD [address] / [place] [address] [phone]   REASON FOR VISIT:  Follow-up for right ovarian cancer  PRIOR THERAPY:  1. Neoadjuvant chemotherapy with carboplatin and paclitaxel x 3 cycles from 01/18/2018 to 03/02/2018. 2. BSO and omentectomy on 03/27/2018 followed by 3 more cycles of carboplatin and paclitaxel from 04/20/2018 to 05/31/2018. 3. Doxil and Avastin x 5 cycles from 07/16/2018 to 11/14/2018. 4. Keytruda x 3 cycles from 11/18/2019 to 12/31/2019 with progression.  NGS Results: PD-L1 CPS 5%, Foundation 1 MS--stable, TMB 5 Muts/Mb  CURRENT THERAPY: To begin topotecan  BRIEF ONCOLOGIC HISTORY:  Oncology History Overview Note  Neg for genetics blood work and HRD High grade serous, recurrent biopsy proven PD-L1 5%  Relapsed within a few months of finishing carbo/taxol, minimal response to Doxil/Avastin, progressed on Gemzar   Right ovarian epithelial cancer ([place])  01/06/2018 Imaging   Ct abdomen and pelvis 1. Complex cystic mass at the right adnexa, measuring 5.9 x 4.0 cm, with nodular components, concerning for primary ovarian malignancy. 2. Diffuse nodularity along the omentum at the left side of the abdomen, extending into the mesentery at the left mid abdomen, concerning for peritoneal carcinomatosis. 3. Wall thickening at the distal ileum adjacent to the ovarian mass; bowel loops appear somewhat adherent to the ovarian mass. Bowel infiltration with tumor cannot be excluded. No evidence of bowel obstruction at this time. 4. Small volume ascites within the abdomen and pelvis.  Aortic Atherosclerosis (ICD10-I70.0).   01/08/2018 Tumor Marker   Patient's tumor was tested for the following markers: CA-125. Results of the tumor marker test revealed 3004   01/15/2018 Imaging   Chest CT:  1. No active cardiopulmonary  disease. 2. Aortic atherosclerosis without aneurysm or dissection. 3. No large central pulmonary embolus.  CT AP:  1. Dilated fluid-filled loops of small bowel are redemonstrated slightly more extensive than on prior exam with transition point likely in the right adnexa adjacent to a complex cystic mass concerning for ovarian neoplasm given septations and soft tissue nodularity. This raises concern for early or partial SBO. This soft tissue mass measures 4.9 x 4 x 4.7 cm and has not changed since prior recent comparison. Additional short segmental area of luminal narrowing is noted in the right lower quadrant involving small bowel for which stigmata of peritoneal carcinomatosis or small-bowel metastatic implants might account for this. 2. Redemonstration of small volume of ascites predominantly in the upper abdomen surrounding the liver and spleen. 3. Redemonstration of thick bandlike omental thickening concerning for peritoneal carcinomatosis.    01/15/2018 Procedure   Successful ultrasound-guided diagnostic and therapeutic paracentesis yielding 1.5 liters of peritoneal fluid.    01/15/2018 Pathology Results   PERITONEAL/ASCITIC FLUID (SPECIMEN 1 OF 1 COLLECTED 01/18/18): MALIGNANT CELLS CONSISTENT WITH METASTATIC ADENOCARCINOMA. SEE COMMENT. COMMENT: THE MALIGNANT CELLS ARE POSITIVE FOR MOC-31, CYTOKERATIN 7, ESTROGEN RECEPTOR, PAX-8, AND WT-1. THEY ARE NEGATIVE FOR CALRETININ, CYTOKERATIN 5/6, AND CYTOKERATIN 20. THE PROFILE IS CONSISTENT WITH A PRIMARY GYNECOLOGIC CARCINOMA. THERE IS LIKELY SUFFICIENT TUMOR PRESENT, IF ADDITIONAL STUDIES ARE REQUESTED.   01/15/2018 - 02/02/2018 Hospital Admission   She was admitted to the hospital for SBO. She was treated with chemotherapy   01/18/2018 - 06/20/2018 Chemotherapy   The patient had carboplatin and taxol   02/08/2018 Cancer Staging   Staging form: Ovary, Fallopian Tube, and Primary Peritoneal Carcinoma, AJCC 8th Edition -  Clinical: cT3, cN0, cM0  - Signed by Heath Lark, MD on 02/08/2018   02/08/2018 Tumor Marker   Patient's tumor was tested for the following markers: CA-125. Results of the tumor marker test revealed 445   03/02/2018 Tumor Marker   Patient's tumor was tested for the following markers: CA-125. Results of the tumor marker test revealed 174   03/15/2018 Imaging   6.2 cm complex cystic right ovarian mass, compatible with malignant ovarian neoplasm, mildly progressive.  Associated peritoneal disease/omental caking beneath the anterior abdominal wall, mildly improved. Prior abdominal ascites is improved/resolved.  4.7 cm cystic left ovarian mass, without overt malignant features, grossly unchanged.  Mild bilateral hydroureteronephrosis, secondary to extrinsic compression, new.  Prior small bowel obstruction has improved/resolved.    03/27/2018 Pathology Results   1. Omentum, resection for tumor - OMENTUM: - HIGH GRADE SEROUS CARCINOMA. 2. Ovary, left - LEFT OVARY: - HIGH GRADE SEROUS CARCINOMA. - LEFT FALLOPIAN TUBE: - HIGH GRADE SEROUS CARCINOMA. 3. Adnexa - ovary +/- tube, neoplastic, right - RIGHT OVARY: - HIGH GRADE SEROUS CARCINOMA, 5.5 CM. - RIGHT FALLOPIAN TUBE: - HIGH GRADE SEROUS CARCINOMA. 4. Peritoneum, biopsy, nodule - PERITONEUM: - NODULES OF HIGH GRADE SEROUS CARCINOMA. 5. Peritoneum, resection for tumor, rectosigmoid - RECTOSIGMOID PERITONEUM: - HIGH GRADE SEROUS CARCINOMA. Microscopic Comment 3. OVARY or FALLOPIAN TUBE or PRIMARY PERITONEUM: Procedure: Bilateral salpingo-oophorectomy, omentectomy and peritoneal biopsies. Specimen Integrity: N/A. Tumor Site: Right ovary. Ovarian Surface Involvement (required only if applicable): Yes. Fallopian Tube Surface Involvement (required only if applicable): Yes. Tumor Size: 5.5 x 4.8 x 4.0 cm. Histologic Type: Serous carcinoma. Histologic Grade: High grade. Implants (required for advanced stage serous/seromucinous borderline tumors only):  Omentum, left ovary and fallopian tube, rectosigmoid peritoneum and peritoneum. Other Tissue/ Organ Involvement: Left ovary and fallopian tube, omentum and rectosigmoid peritoneum. Largest Extrapelvic Peritoneal Focus (required only if applicable): 67.2 cm, omentum. Peritoneal/Ascitic Fluid: N/A. Treatment Effect (required only for high-grade serous carcinomas): Minimal. Regional Lymph Nodes: No lymph nodes submitted. Pathologic Stage Classification (pTNM, AJCC 8th Edition): pT3c, pNX. Representative Tumor Block: 3A, 3B, 3D, 3E and 44F. Comment(s): There is a 5.5 cm in greatest dimension partially cystic high grade serous carcinoma involving the right adnexal specimen and the tumor is staged as a primary right ovarian carcinoma. The carcinoma also involves the left ovary as well as the left fallopian tube, the omentum, peritoneum and the rectosigmoid peritoneal specimens.   03/27/2018 Surgery   Preoperative Diagnosis: ovarian cancer, stage IIIC, metastatic to omentum, peritoneum, serosa of intestines   Procedure(s) Performed: 1. Exploratory laparotomy with bilateral salpingo-oophorectomy, omentectomy radical tumor debulking for ovarian cancer .  Surgeon: Thereasa Solo, MD.   Specimens: Bilateral tubes / ovaries, omentum. Peritoneal nodules, rectosigmoid nodules.    Operative Findings: omental cake, miliary studding on diaphragm and bilateral paracolic gutters. Sigmoid colon densely adherent to left and right ovaries with tumor rind, ureters mildly dilated bilaterally due to retroperitoneal extension of the tumor towards ureters causing compression.    This represented an optimal cytoreduction (R1) with gross residual disease on the diaphragm that had been ablated and a thin tumor plaque on the sigmoid colon that was ablated. No residual disease >1cm remaining   05/07/2018 Genetic Testing   Negative genetic testing on the [facility] TumorNextHRD+ CancerNext Panel. The CancerNext gene panel offered  by [employer] includes sequencing and rearrangement analysis for the following 34 genes:   APC, ATM, BARD1, BMPR1A, BRCA1, BRCA2, BRIP1, CDH1, CDK4, CDKN2A, CHEK2, DICER1, EPCAM, GREM1, HOXB13, MLH1, MRE11A,  MSH2, MSH6, MUTYH, NBN, NF1, PALB2, PMS2, POLD1, POLE, PTEN, RAD50, RAD51C, RAD51D, SMAD4, SMARCA4, STK11, and TP53. Somatic genes analyzed through TumorNext-HRD: ATM, BARD1, BRCA1, BRCA2, BRIP1, CHEK2, MRE11A, NBN, PALB2, RAD51C, RAD51D. The report date is 05/07/2018.   05/11/2018 Tumor Marker   Patient's tumor was tested for the following markers: CA-125. Results of the tumor marker test revealed 60.5   07/02/2018 Imaging   1. Mild right pericardiophrenic adenopathy is mildly increased, suspicious for metastatic nodes. No abdominopelvic adenopathy. 2. Small left peritoneal soft tissue nodule adjacent to the splenic flexure and smooth left pelvic sidewall peritoneal thickening, cannot exclude residual/recurrent disease. Attention on follow-up CT advised. 3. Bilateral renal collecting system dilatation has improved. 4.  Aortic Atherosclerosis (ICD10-I70.0).   07/02/2018 Tumor Marker   Patient's tumor was tested for the following markers: CA-125 Results of the tumor marker test revealed 42.7   07/11/2018 Echocardiogram   1. The left ventricle has normal systolic function with an ejection fraction of 60-65%. The cavity size was normal. Left ventricular diastolic parameters were normal.  2. Normal GLS -20.1.  3. The right ventricle has normal systolic function. The cavity was normal. There is no increase in right ventricular wall thickness.  4. The mitral valve is degenerative. Moderate thickening of the mitral valve leaflet. Moderate calcification of the mitral valve leaflet.  5. The aortic valve is tricuspid. Moderate thickening of the aortic valve. Moderate calcification of the aortic valve. Aortic valve regurgitation is trivial by color flow Doppler.  6. The aortic root is normal in size and  structure.   07/16/2018 - 12/03/2018 Chemotherapy   The patient had doxorubicin and bevacizumab for chemotherapy treatment.     07/30/2018 Tumor Marker   Patient's tumor was tested for the following markers: CA-125 Results of the tumor marker test revealed 82.6   08/27/2018 Tumor Marker   Patient's tumor was tested for the following markers: CA-125 Results of the tumor marker test revealed 111.   09/24/2018 Tumor Marker   Patient's tumor was tested for the following markers: CA-125 Results of the tumor marker test revealed 142.   10/05/2018 Imaging   1. Stable right juxta diaphragmatic/pericardiophrenic lymph node, mildly enlarged. 2. Otherwise no definite metastatic disease identified in the abdomen/pelvis. 3. 3 mm peritoneal nodule noted left paracolic gutter on today's study, indeterminate. Attention on follow-up recommended. 4.  Aortic Atherosclerois (ICD10-170.0)   10/16/2018 Echocardiogram    1. No significant change from prior study (07/11/2018).  2. Left ventricular ejection fraction, by visual estimation, is 60 to 65%. The left ventricle has normal function. Normal left ventricular size. There is no left ventricular hypertrophy.  3. LVEF by 3D assessment 63%.  4. The average left ventricular global longitudinal strain is -22.4 %.  5. Global right ventricle has normal systolic function.The right ventricular size is normal. No increase in right ventricular wall thickness.  6. Left atrial size was normal.  7. Right atrial size was normal.  8. Presence of pericardial fat pad.  9. Moderate thickening of the mitral valve leaflet(s). 10. The mitral valve is normal in structure. Trace mitral valve regurgitation. 11. The tricuspid valve is normal in structure. Tricuspid valve regurgitation is mild. 12. Aortic regurgitation PHT measures 467 msec. 13. The aortic valve is tricuspid Aortic valve regurgitation is mild by color flow Doppler. Mild aortic valve sclerosis without stenosis. 14.  There is mild to moderate calcification of the AoV, with focal calcification of the Dupuyer. The aortic regurgitation is mild in severity, likely related to  degenerative valve disease. 15. The pulmonic valve was grossly normal. Pulmonic valve regurgitation is not visualized by color flow Doppler. 16. Mild plaque invoving the ascending aorta. 17. Normal pulmonary artery systolic pressure. 18. The tricuspid regurgitant velocity is 2.52 m/s, and with an assumed right atrial pressure of 3 mmHg, the estimated right ventricular systolic pressure is normal at 28.4 mmHg.   10/26/2018 Tumor Marker   Patient's tumor was tested for the following markers: CA-125 Results of the tumor marker test revealed 196   12/11/2018 Imaging   1. No substantial interval change in exam. No definite findings to suggest recurrent/metastatic disease. 2. Right pericardial phrenic lymph node noted on multiple prior studies is unchanged in the interval. 3. 8 mm omental nodule identified on today's exam. Close attention on follow-up recommended. 3 mm soft tissue nodule along the left paracolic gutter seen previously has resolved in the interval. 4.  Aortic Atherosclerois (ICD10-170.0)   04/02/2019 Imaging   1. Interval development of left axillary and subpectoral lymphadenopathy is highly concerning for metastatic disease, however, this would be a highly unusual pattern of metastatic spread for an ovarian primary neoplasm. This is most concerning for potential left-sided breast cancer. Correlation with mammography is strongly recommended. 2. No other signs of definite metastatic disease noted elsewhere in the chest, abdomen or pelvis. 3. Aortic atherosclerosis. 4. Additional incidental findings, similar to prior studies, as above.     04/03/2019 Pathology Results   Immunohistochemistry shows the carcinoma is positive with cytokeratin AE1/AE3, cytokeratin 7, PAX-8, WT-1, and shows patchy positivity with estrogen receptor. The  carcinoma is negative with GATA3, GCDFP, p53, Napsin A and TTF-1. The immunophenotype is most consistent with metastatic high grade serous carcinoma. (JDP:ah 04/04/19)FINAL DIAGNOSIS Diagnosis Lymph node, needle/core biopsy, left axilla - METASTATIC CARCINOMA. - SEE MICROSCOPIC DESCRIPT    Genetic Testing   Patient has genetic testing done for PD-L1. Results revealed patient has the following: PD-L1 staining in tumor cells (TC): 3% PD-L1 staining in tumor-associated immune cells (IC): 10% PD-L1 combined positive score (CPS): 5%   04/15/2019 - 06/13/2019 Chemotherapy   The patient had gemzar for chemotherapy treatment.     06/21/2019 Imaging   1. Bulky LEFT axillary adenopathy greater than RIGHT axillary adenopathy increasing in size. 2. Developing nodularity and or small lymph nodes along the LEFT hemidiaphragm in the inferior pre pericardial fat. Attention on follow-up. 3. Stable small lymph nodes throughout the retroperitoneum. 4. Stable low-density hepatic lesions compatible with cysts 5. Aortic atherosclerosis.   Aortic Atherosclerosis (ICD10-I70.0).     10/03/2019 Genetic Testing   [facility] Results:     11/18/2019 - 01/21/2020 Chemotherapy         01/27/2020 -  Chemotherapy    Patient is on Treatment Plan: OVARIAN TOPOTECAN (1.25) D1-5 Q21D        CANCER STAGING: Cancer Staging Right ovarian epithelial cancer ([place]) Staging form: Ovary, Fallopian Tube, and Primary Peritoneal Carcinoma, AJCC 8th Edition - Clinical: cT3, cN0, cM0 - Signed by Heath Lark, MD on 02/08/2018   INTERVAL HISTORY:  [name], a 75 y.o. female, returns for routine follow-up of her right ovarian cancer. [place] was last seen on 01/21/2020.   Today she is accompanied by her husband and she reports feeling okay. She developed upper abdominal pain on 1/17 and got so severe that she almost went to the ED. She takes Prilosec every morning. She is having regular BM's though she has been  having pale stools, almost beige, for the past  few weeks. She reports that eating some seeds worsens her abdominal pain and bloating.  She is adamant about beginning chemo again due to the side effects.   REVIEW OF SYSTEMS:  Review of Systems  Constitutional: Positive for fatigue (75%). Negative for appetite change.  Gastrointestinal: Positive for abdominal distention (swelling and bloating) and abdominal pain (upper abdominal pain).  All other systems reviewed and are negative.   PAST MEDICAL/SURGICAL HISTORY:  Past Medical History:  Diagnosis Date  . Allergy    SEASONAL  . Anxiety   . Cataract    BILATERAL  . Dysrhythmia   . Family history of lung cancer   . Family history of prostate cancer   . Family history of prostate cancer   . Family history of uterine cancer   . Fibromyalgia   . GERD (gastroesophageal reflux disease)   . H/O echocardiogram 04/28/08   EF>55% trace mitral regurgitation, No significant valvular pathology  . Hemorrhoids    internal  . History of stress test 02/08/2011   Normal Myocardial perfusion study, this is a low risk scan, No prior study available for comparison  . Hyperlipidemia   . Hypertension    hx of  . Hypothyroidism   . MVP (mitral valve prolapse)    mild and MR  . OA (osteoarthritis)   . Osteoporosis   . ovarian ca dx'd 12/2017   ovarian cancer  . Paroxysmal A-fib ([place])   . Sleep apnea    does not use prescribed CPAP  does not tolerate  . Thyroid disease    HYPO  . Varicosities of leg   . Vitamin D deficiency    Past Surgical History:  Procedure Laterality Date  . ABDOMINAL HYSTERECTOMY  1989  . APPENDECTOMY  1989   with hysterectomy  . BILATERAL SALPINGECTOMY N/A 03/27/2018   Procedure: EXPLORATORY LAPARATOMY, BILATERAL SALPINGOOPHERECTOMY;  Surgeon: Everitt Amber, MD;  Location: [facility];  Service: Gynecology;  Laterality: N/A;  . BREAST EXCISIONAL BIOPSY Left 1995   Benign  . COLONOSCOPY  11-20-2000  . DEBULKING N/A  03/27/2018   Procedure: RADICAL TUMOR DEBULKING;  Surgeon: Everitt Amber, MD;  Location: [facility];  Service: Gynecology;  Laterality: N/A;  . IR IMAGING GUIDED PORT INSERTION  01/17/2018  . NOSE SURGERY  1990  . OMENTECTOMY N/A 03/27/2018   Procedure: OMENTECTOMY;  Surgeon: Everitt Amber, MD;  Location: [facility];  Service: Gynecology;  Laterality: N/A;    SOCIAL HISTORY:  Social History   Socioeconomic History  . Marital status: Married    Spouse name: Not on file  . Number of children: 3  . Years of education: Not on file  . Highest education level: Not on file  Occupational History  . Occupation: retired  Tobacco Use  . Smoking status: Former Smoker    Years: 10.00    Types: Cigars    Quit date: 10/24/2019    Years since quitting: 0.2  . Smokeless tobacco: Never Used  . Tobacco comment: not daily  [place] cigars 1-2   Vaping Use  . Vaping Use: Never used  Substance and Sexual Activity  . Alcohol use: No    Alcohol/week: 0.0 standard drinks  . Drug use: No  . Sexual activity: Not Currently    Comment: 1st intercourse 17yo-1 partner  Other Topics Concern  . Not on file  Social History Narrative  . Not on file   Social Determinants of Health   Financial Resource Strain: Low Risk   . Difficulty of Paying  Living Expenses: Not hard at all  Food Insecurity: No Food Insecurity  . Worried About Charity fundraiser in the Last Year: Never true  . Ran Out of Food in the Last Year: Never true  Transportation Needs: No Transportation Needs  . Lack of Transportation (Medical): No  . Lack of Transportation (Non-Medical): No  Physical Activity: Inactive  . Days of Exercise per Week: 0 days  . Minutes of Exercise per Session: 0 min  Stress: Stress Concern Present  . Feeling of Stress : To some extent  Social Connections: Moderately Isolated  . Frequency of Communication with Friends and Family: More than three times a week  . Frequency of Social Gatherings with Friends and Family:  Three times a week  . Attends Religious Services: Never  . Active Member of Clubs or Organizations: No  . Attends Archivist Meetings: Never  . Marital Status: Married  Human resources officer Violence: Not At Risk  . Fear of Current or Ex-Partner: No  . Emotionally Abused: No  . Physically Abused: No  . Sexually Abused: No    FAMILY HISTORY:  Family History  Problem Relation Age of Onset  . Diabetes Daughter   . Psoriasis Daughter   . Heart defect Daughter   . Diabetes Mother   . Hypertension Mother   . Heart disease Mother   . Stroke Maternal Grandmother   . Heart attack Brother   . Heart attack Brother   . Prostatitis Brother   . Lung cancer Niece   . Cancer Paternal Aunt        type of cancer unk  . Prostate cancer Paternal Uncle   . Uterine cancer Cousin        dx under 85  . Colon cancer Neg Hx     CURRENT MEDICATIONS:  Current Outpatient Medications  Medication Sig Dispense Refill  . ALPRAZolam (XANAX) 0.5 MG tablet TAKE 1/2 TO 1 (ONE-HALF TO ONE) TABLET BY MOUTH ONCE DAILY AS NEEDED FOR ANXIETY 30 tablet 3  . alum & mag hydroxide-simeth (MAALOX/MYLANTA) 200-200-20 MG/5ML suspension Take 30 mLs by mouth every 6 (six) hours as needed for indigestion or heartburn. 355 mL 0  . calcium carbonate (OSCAL) 1500 (600 Ca) MG TABS tablet Take 600 mg of elemental calcium by mouth daily.    . cholecalciferol (VITAMIN D) 1000 UNITS tablet Take 1,000 Units by mouth daily.    Marland Kitchen estradiol (ESTRACE) 0.1 MG/GM vaginal cream Place 1 Applicatorful vaginally 3 (three) times a week. 42.5 g 12  . fluticasone (CUTIVATE) 0.05 % cream APPLY CREAM TOPICALLY UP TO TWICE DAILY AS NEEDED FOR RASH    . fluticasone (FLONASE) 50 MCG/ACT nasal spray Place 2 sprays into both nostrils daily. 48 g 2  . gabapentin (NEURONTIN) 300 MG capsule Take 1 capsule (300 mg total) by mouth 2 (two) times daily. (Patient taking differently: Take 300 mg by mouth 2 (two) times daily. Taking 1 capsule at bedtime) 60  capsule 3  . HYDROmorphone (DILAUDID) 2 MG tablet Take 1 tablet (2 mg total) by mouth daily as needed for severe pain. 30 tablet 0  . lactose free nutrition (BOOST PLUS) LIQD Take 237 mLs by mouth daily. 10 Can 0  . levothyroxine (SYNTHROID) 75 MCG tablet Take 1 tablet (75 mcg total) by mouth daily. (Patient taking differently: Take 75 mcg by mouth daily. Taking 1 tablet every day except 1/2 tablet on Tues and Thurs) 90 tablet 3  . losartan (COZAAR) 25 MG tablet Take  1 tablet by mouth once daily 90 tablet 0  . Magnesium 400 MG TABS Take 250 mg by mouth daily.    . Menthol-Methyl Salicylate (MUSCLE RUB) 10-15 % CREA Apply 1 application topically as needed for muscle pain.    . metoprolol succinate (TOPROL-XL) 50 MG 24 hr tablet Take 0.5 tablets (25 mg total) by mouth daily. Take with or immediately following a meal. 90 tablet 0  . Multiple Vitamins-Minerals (MULTIVITAMIN WITH MINERALS) tablet Take 1 tablet by mouth daily.    . Omega-3 Fatty Acids (FISH OIL) 1000 MG CAPS Take 2,000 mg by mouth in the morning and at bedtime.    Marland Kitchen omeprazole (PRILOSEC) 40 MG capsule Take 1 capsule (40 mg total) by mouth daily. 90 capsule 3  . polyethylene glycol (MIRALAX / GLYCOLAX) packet Take 17 g by mouth daily. 14 each 0  . prochlorperazine (COMPAZINE) 10 MG tablet Take 1 tablet (10 mg total) by mouth every 6 (six) hours as needed. 30 tablet 3  . simvastatin (ZOCOR) 20 MG tablet Take 1 tablet (20 mg total) by mouth at bedtime. 90 tablet 1  . tretinoin (RETIN-A) 0.025 % cream APPLY TOPICALLY TO FACE AT BEDTIME AS NEEDED    . vitamin B-12 (CYANOCOBALAMIN) 1000 MCG tablet Take 1,000 mcg by mouth every other day.    . vitamin C (ASCORBIC ACID) 500 MG tablet Take 1,000 mg by mouth daily.    . vitamin E 45 MG (100 UNITS) capsule Take by mouth daily.    [name] 20 MG TABS tablet TAKE 1 TABLET BY MOUTH ONCE DAILY WITH SUPPER 90 tablet 2  . zinc gluconate 50 MG tablet Take 50 mg by mouth daily.    . Artificial Saliva  (SALIVASURE) LOZG Use as directed 1 lozenge in the mouth or throat every 4 (four) hours as needed. (Patient not taking: Reported on 01/30/2020) 90 lozenge 0  . desonide (DESOWEN) 0.05 % ointment Apply 1 application topically 2 (two) times daily. For up to 2 weeks. (Patient not taking: Reported on 01/30/2020) 15 g 1  . DIGESTIVE ENZYMES PO Take 1 capsule by mouth daily. (Patient not taking: Reported on 01/30/2020)    . senna (SENOKOT) 8.6 MG TABS tablet Take 1 tablet (8.6 mg total) by mouth at bedtime as needed for mild constipation. (Patient not taking: Reported on 01/30/2020) 120 each 0   No current facility-administered medications for this visit.    ALLERGIES:  Allergies  Allergen Reactions  . Tramadol Hcl     jittery    PHYSICAL EXAM:  Performance status (ECOG): 1 - Symptomatic but completely ambulatory  Vitals:   01/30/20 1152  BP: (!) 137/57  Pulse: (!) 58  Resp: 19  Temp: 97.7 F (36.5 C)  SpO2: 100%   Wt Readings from Last 3 Encounters:  01/30/20 160 lb 9.6 oz (72.8 kg)  01/22/20 157 lb (71.2 kg)  01/21/20 157 lb 6.4 oz (71.4 kg)   Physical Exam Vitals reviewed.  Constitutional:      Appearance: Normal appearance.  Neurological:     General: No focal deficit present.     Mental Status: She is alert and oriented to person, place, and time.  Psychiatric:        Mood and Affect: Mood normal.        Behavior: Behavior normal.      LABORATORY DATA:  I have reviewed the labs as listed.  CBC Latest Ref Rng & Units 01/21/2020 12/31/2019 12/09/2019  WBC 4.0 - 10.5 K/uL  7.8 6.5 6.5  Hemoglobin 12.0 - 15.0 g/dL 10.9(L) 10.5(L) 11.0(L)  Hematocrit 36.0 - 46.0 % 35.0(L) 33.3(L) 34.7(L)  Platelets 150 - 400 K/uL 223 186 185   CMP Latest Ref Rng & Units 01/21/2020 12/31/2019 12/09/2019  Glucose 70 - 99 mg/dL 92 86 104(H)  BUN 8 - 23 mg/dL $Remove'16 15 14  'XyHkGFM$ Creatinine 0.44 - 1.00 mg/dL 0.85 0.81 0.86  Sodium 135 - 145 mmol/L 133(L) 136 135  Potassium 3.5 - 5.1 mmol/L 3.9 3.9  3.3(L)  Chloride 98 - 111 mmol/L 101 103 103  CO2 22 - 32 mmol/L $RemoveB'25 26 25  'RgXqrcku$ Calcium 8.9 - 10.3 mg/dL 8.9 8.9 8.8(L)  Total Protein 6.5 - 8.1 g/dL 7.3 7.3 7.5  Total Bilirubin 0.3 - 1.2 mg/dL 0.6 0.5 0.6  Alkaline Phos 38 - 126 U/L 75 67 74  AST 15 - 41 U/L 42(H) 27 26  ALT 0 - 44 U/L $Remo'19 17 19   'HjWEx$ Lab Results  Component Value Date   CA125 3,004 (H) 01/08/2018    DIAGNOSTIC IMAGING:  I have independently reviewed the scans and discussed with the patient. CT CHEST ABDOMEN PELVIS W CONTRAST  Result Date: 01/17/2020 CLINICAL DATA:  Right ovarian cancer, status post BSO and omentectomy in 2020, chemotherapy and immunotherapy ongoing EXAM: CT CHEST, ABDOMEN, AND PELVIS WITH CONTRAST TECHNIQUE: Multidetector CT imaging of the chest, abdomen and pelvis was performed following the standard protocol during bolus administration of intravenous contrast. CONTRAST:  16mL OMNIPAQUE IOHEXOL 300 MG/ML  SOLN COMPARISON:  CT chest dated 12/09/2019. CT chest abdomen pelvis dated 10/03/2019. FINDINGS: CT CHEST FINDINGS Cardiovascular: Heart is normal in size.  No pericardial effusion. No evidence of thoracic aortic aneurysm. Atherosclerotic calcifications of the aortic arch. Right chest port terminates at the cavoatrial junction. Mediastinum/Nodes: Bulky axillary lymphadenopathy, measuring up to 2.4 cm short axis, previously 2.2 cm on the right and 2.0 cm on the left. Additional small thoracic nodes, including an 8 mm short axis node at the left thoracic inlet (series 2/image 6), an 8 mm short axis high right paratracheal node, and a 9 mm short axis low right paratracheal node, mildly progressive. 11 mm short axis right precardial node (series 2/image 42), unchanged. Lungs/Pleura: Mild biapical pleural-parenchymal scarring. No suspicious pulmonary nodules. No focal consolidation. No pleural effusion or pneumothorax. Musculoskeletal: No focal osseous lesions. CT ABDOMEN PELVIS FINDINGS Hepatobiliary: Small left hepatic  cysts measuring up to 13 mm (series 2/image 51). Gallbladder is unremarkable. No intrahepatic or extrahepatic ductal dilatation. Pancreas: Within normal limits. Spleen: Within normal limits. Adrenals/Urinary Tract: Adrenal glands are within normal limits. Kidneys are within normal limits.  No hydronephrosis. Thick-walled bladder, although underdistended. Stomach/Bowel: Stomach is notable for a tiny hiatal hernia. No evidence of bowel obstruction. Appendix is not discretely visualized. 3.6 cm serosal implant along the ascending colon (series 2/image 83), previously 2.6 cm. Vascular/Lymphatic: No evidence of abdominal aortic aneurysm. Atherosclerotic calcifications of the abdominal aorta and branch vessels. Upper abdominal/retroperitoneal lymphadenopathy, including: --19 mm short axis node in the porta hepatis (series 2/image 62), previously 10 mm --13 mm short axis left para-aortic node (series 2/image 63), previously 12 mm Reproductive: Status post hysterectomy. No adnexal masses. Other: Mild loculated fluid in the right pelvis (series 2/image 108), new. Additional ascites along Morrison's pouch (series 2/image 70) and the left pericolic gutter (series 2/image 84), new. Peritoneal disease/omental caking beneath the anterior abdominal wall, new/progressive. Dominant 15 mm implant beneath the right anterior abdominal wall (series 2/image 76). Musculoskeletal: Mild degenerative changes of  the lumbar spine. IMPRESSION: Bulky bilateral axillary nodal metastases, mildly progressive. Additional thoracic, upper abdominal, and retroperitoneal nodal metastases, mildly progressive. Dominant serosal implant along the ascending colon, progressive. Additional peritoneal disease/omental caking with abdominopelvic ascites, new/progressive. Aortic Atherosclerosis (ICD10-I70.0). Electronically Signed   By: Julian Hy M.D.   On: 01/17/2020 09:47     ASSESSMENT:  1. Platinum resistant high-grade serous ovarian  carcinoma: -Neoadjuvant chemotherapy with 3 cycles of carboplatin and paclitaxel from 01/18/2018 through 03/02/2018 presentation as stage IIIc -03/27/2018-BSO,omentectomy with pathology showing high-grade serous carcinoma, PT3CP NX followed by 3 more cycles of carboplatin and paclitaxel from 04/20/2018 through 05/31/2018. -5 cycles of Doxil and Avastin from 07/16/2018 through 11/14/2018 -Left axillary lymph node biopsy on 04/03/2019 consistent with high-grade serous carcinoma. -Third line therapy with gemcitabine 3 cycles from 04/15/2019 through 06/03/2019. -CT scan of the AP on 06/21/2019 shows bulky left axillary adenopathy greater than the right axillary adenopathy increasing in size, developing nodularity and/or small lymph nodes along the left hemidiaphragm in the inferior prepericardial fat. Stable small lymph nodes throughout the retroperitoneum. -She was seen by Drs. Alvy Bimler and Dr. Denman George and was given palliative options including chemotherapy with topotecan, docetaxel and pembrolizumab. -PD-L1 CPS score is 6%. -Germline and somatic mutation testing negative on 04/11/2018. -Foundation 1 testing did not show targetable mutations. -CT CAP from 10/03/2019 shows bulky axillary adenopathy enlarged on the right side and stable on the left axilla. Increasing size of the mediastinal lymph nodes. Soft tissue implant along the ascending colon. Enlarging lymph nodes and increasing number of lymph nodes in the upper abdomen. -CA-125 is worsening at 768. -3 cycles of Keytruda from 11/18/2019 through 12/31/2019 with progression. - CT CAP on 01/17/2020 showed mild progression of bulky bilateral axillary lymph node metastasis, upper abdominal, retroperitoneal nodal metastasis.  Dominant serosal implant along the ascending colon progressive.  Additional peritoneal disease/omental caking with ascites new.  2. Social/family history: -Patient smokes half pack per day of cigarettes. -Family history significant for maternal  niece with breast cancer.   PLAN:  1. Platinum resistant high-grade serous ovarian carcinoma: -CA125 has increased to 5400. - Reviewed CT CAP images with the patient which showed progression. - She complained of some abdominal pain which subsided at this time. - We have again discussed treatment options including hospice versus palliative chemotherapy with topotecan. - We have again discussed side effects of topotecan in detail. - After discussion, she decided to not to proceed with chemotherapy and opted for hospice. - She would talk to hospice from [place], referred through her insurance.  If she does not like their options, she will call us back and we will make a referral to [facility]. - I have not given any follow-up appointment.  2.  Nausea: - Continue Compazine 10 mg every 6 hours as needed.  3.  Hypothyroidism: -  TSH is 7.5.  She is off of Keytruda.  No intervention needed.   Orders placed this encounter:  No orders of the defined types were placed in this encounter.    Derek Jack, MD [place] [phone]   I, Milinda Antis, am acting as a scribe for Dr. Sanda Linger.  I, Derek Jack MD, have reviewed the above documentation for accuracy and completeness, and I agree with the above.

## 2020-01-31 ENCOUNTER — Telehealth: Payer: Self-pay | Admitting: Family Medicine

## 2020-01-31 ENCOUNTER — Ambulatory Visit (HOSPITAL_COMMUNITY): Payer: PPO

## 2020-01-31 NOTE — Telephone Encounter (Signed)
Haley [name] is calling and stated that she dropped off an order to evaluate and admit patient to hospice and wanted to see if the provider can fax the form back this morning, please advise. CB is [phone]

## 2020-01-31 NOTE — Telephone Encounter (Signed)
Completed and signed. Thanks, BJ

## 2020-01-31 NOTE — Telephone Encounter (Signed)
The form is in your basket to be signed.

## 2020-02-01 IMAGING — US US PARACENTESIS
1 series · 7 of 7 positions shown · non-contrast
Comparison: none

INDICATION: Patient with history of abdominal pain, complex right adnexal cystic
mass, elevated 5T-AS4, omental nodularity, ascites. Request made for
diagnostic and therapeutic paracentesis.

[Series 1: us paracentesis · 0.19mm/px · 7 of 7 slices shown]
[im 1/7]
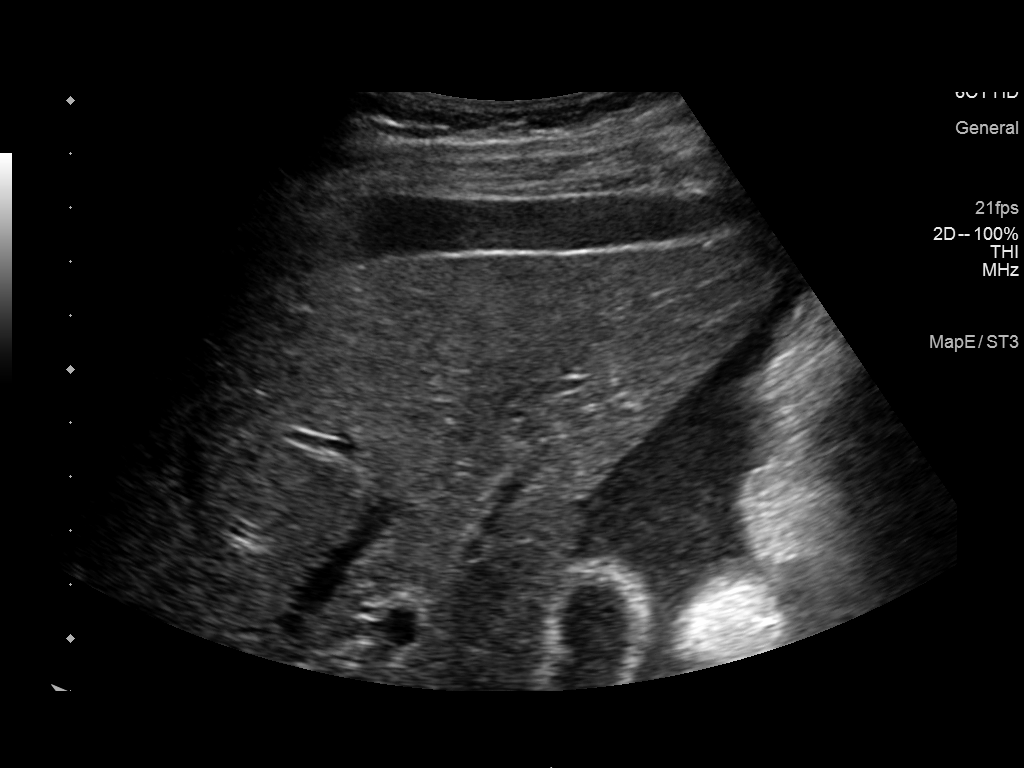
[im 2/7]
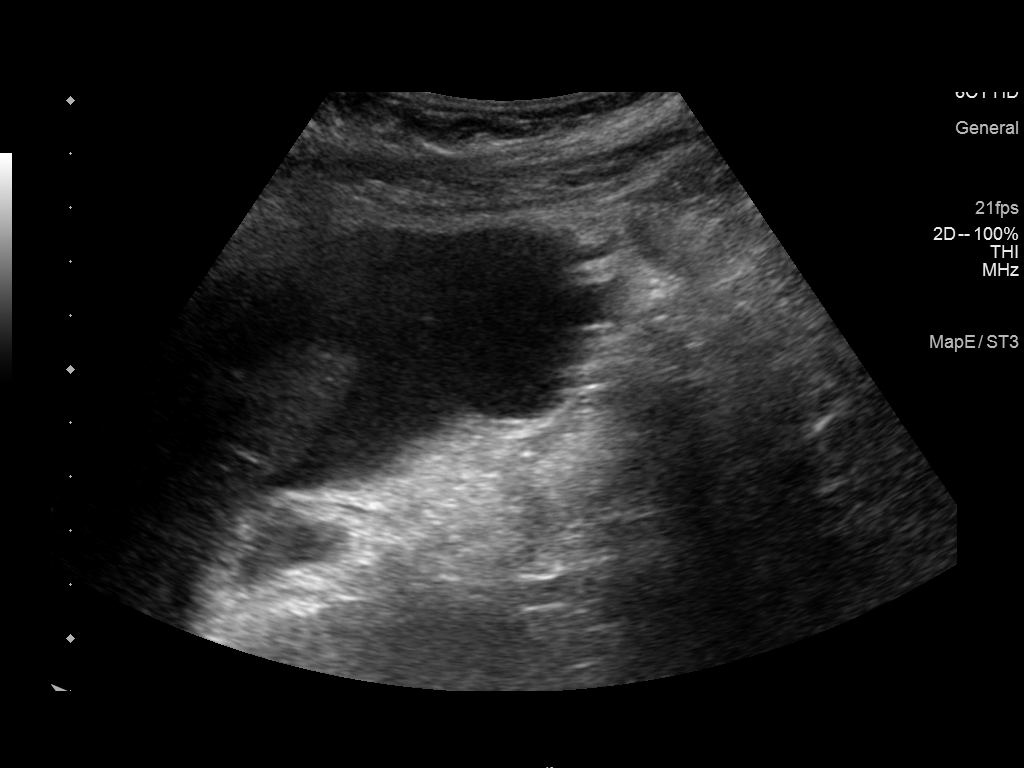
[im 3/7]
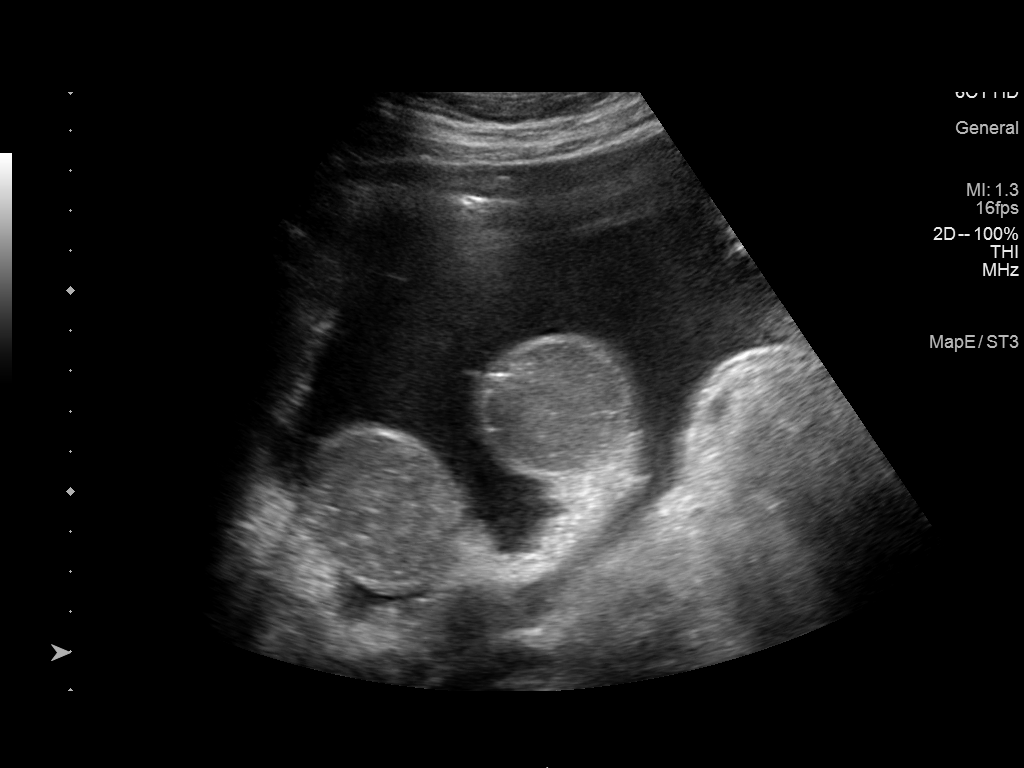
[im 4/7]
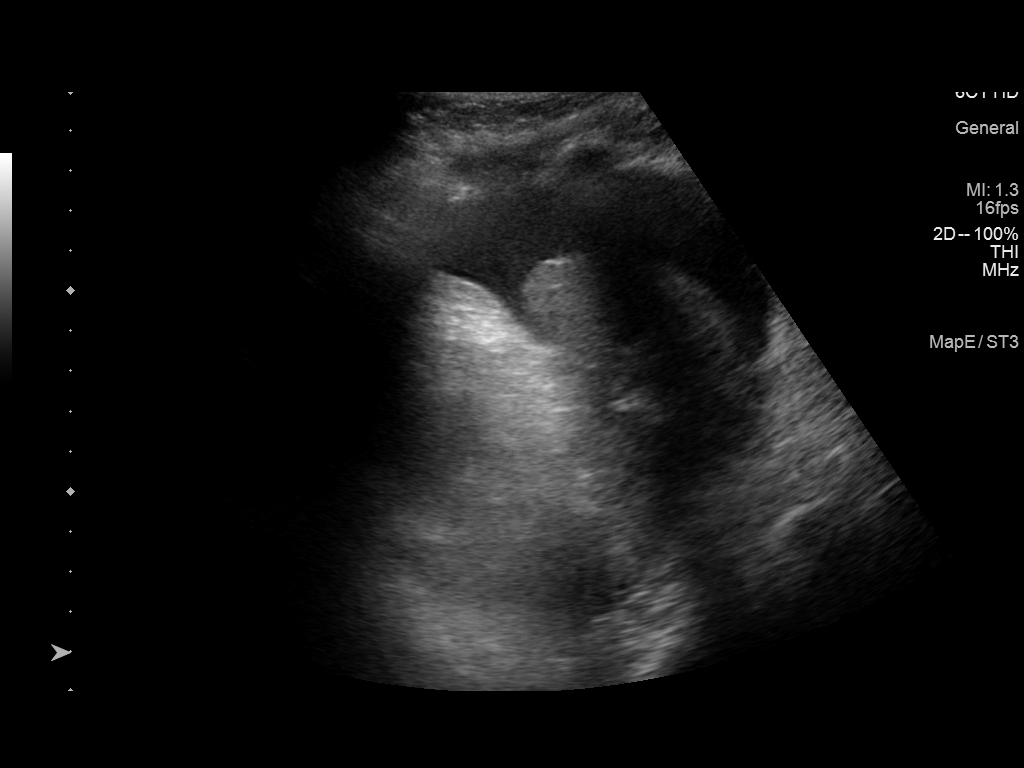
[im 5/7]
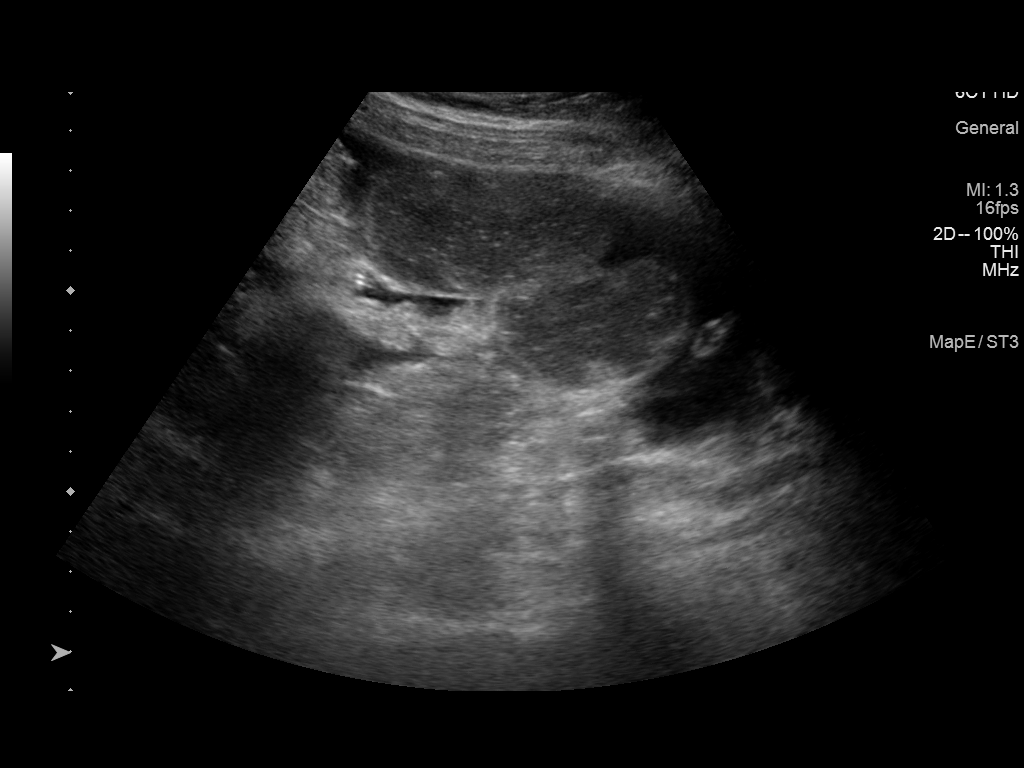
[im 6/7]
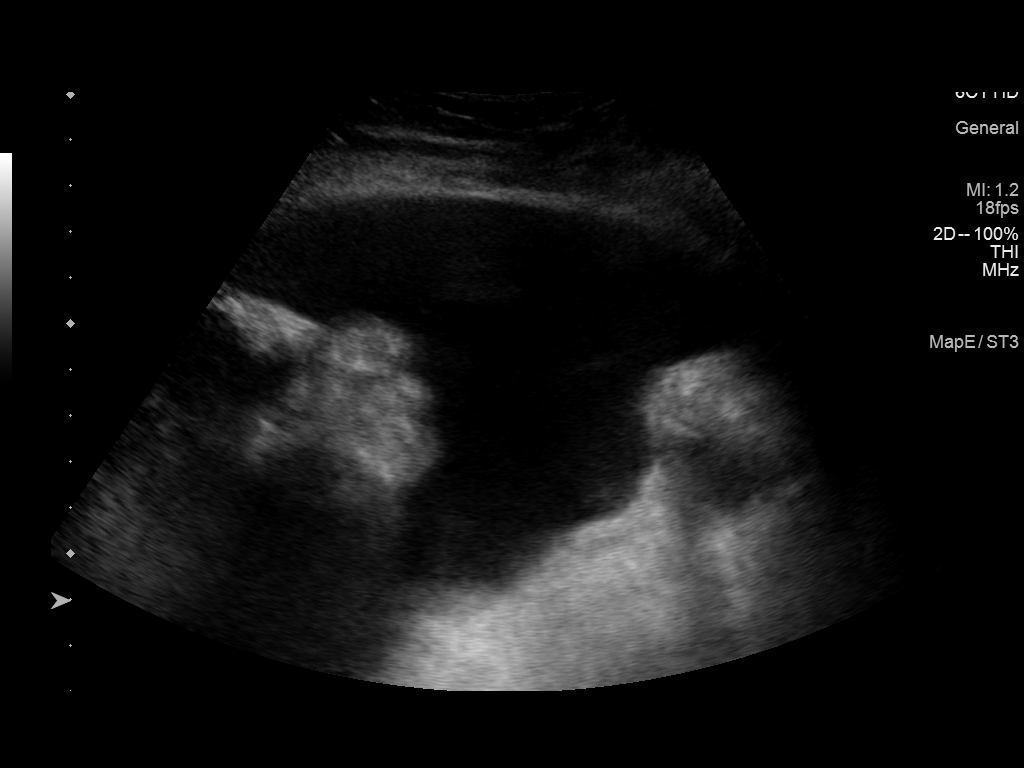
[im 7/7]
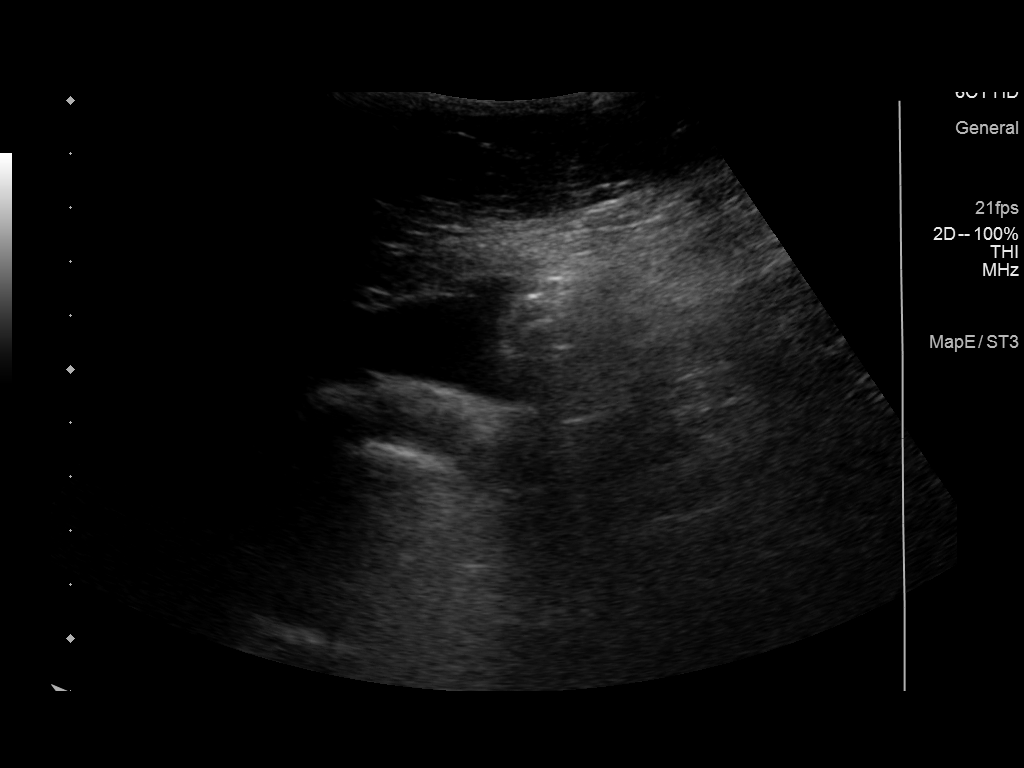

[7 of 7 positions shown; findings below may reference images not displayed]

EXAM:
ULTRASOUND GUIDED DIAGNOSTIC AND THERAPEUTIC PARACENTESIS

MEDICATIONS:
None

COMPLICATIONS:
None immediate.

PROCEDURE:
Informed written consent was obtained from the patient after a
discussion of the risks, benefits and alternatives to treatment. A
timeout was performed prior to the initiation of the procedure.

Initial ultrasound scanning demonstrates a small amount of ascites
within the right lower abdominal quadrant. The right lower abdomen
was prepped and draped in the usual sterile fashion. 1% lidocaine
was used for local anesthesia.

Following this, a 19 gauge, 10-cm, Yueh catheter was introduced. An
ultrasound image was saved for documentation purposes. The
paracentesis was performed. The catheter was removed and a dressing
was applied. The patient tolerated the procedure well without
immediate post procedural complication.
FINDINGS: A total of approximately 1.5 liters of hazy, yellow fluid was
removed. Samples were sent to the laboratory as requested by the
clinical team.
IMPRESSION: Successful ultrasound-guided diagnostic and therapeutic paracentesis
yielding 1.5 liters of peritoneal fluid.

## 2020-02-01 IMAGING — CR DG ABDOMEN 2V
3 series · 3 of 3 positions shown · non-contrast
Comparison: CT scan of January 06, 2018.

CLINICAL DATA: Acute generalized abdominal pain and distention.
History of ovarian cancer.

EXAM:
ABDOMEN - 2 VIEW

[w abdomen upright *]
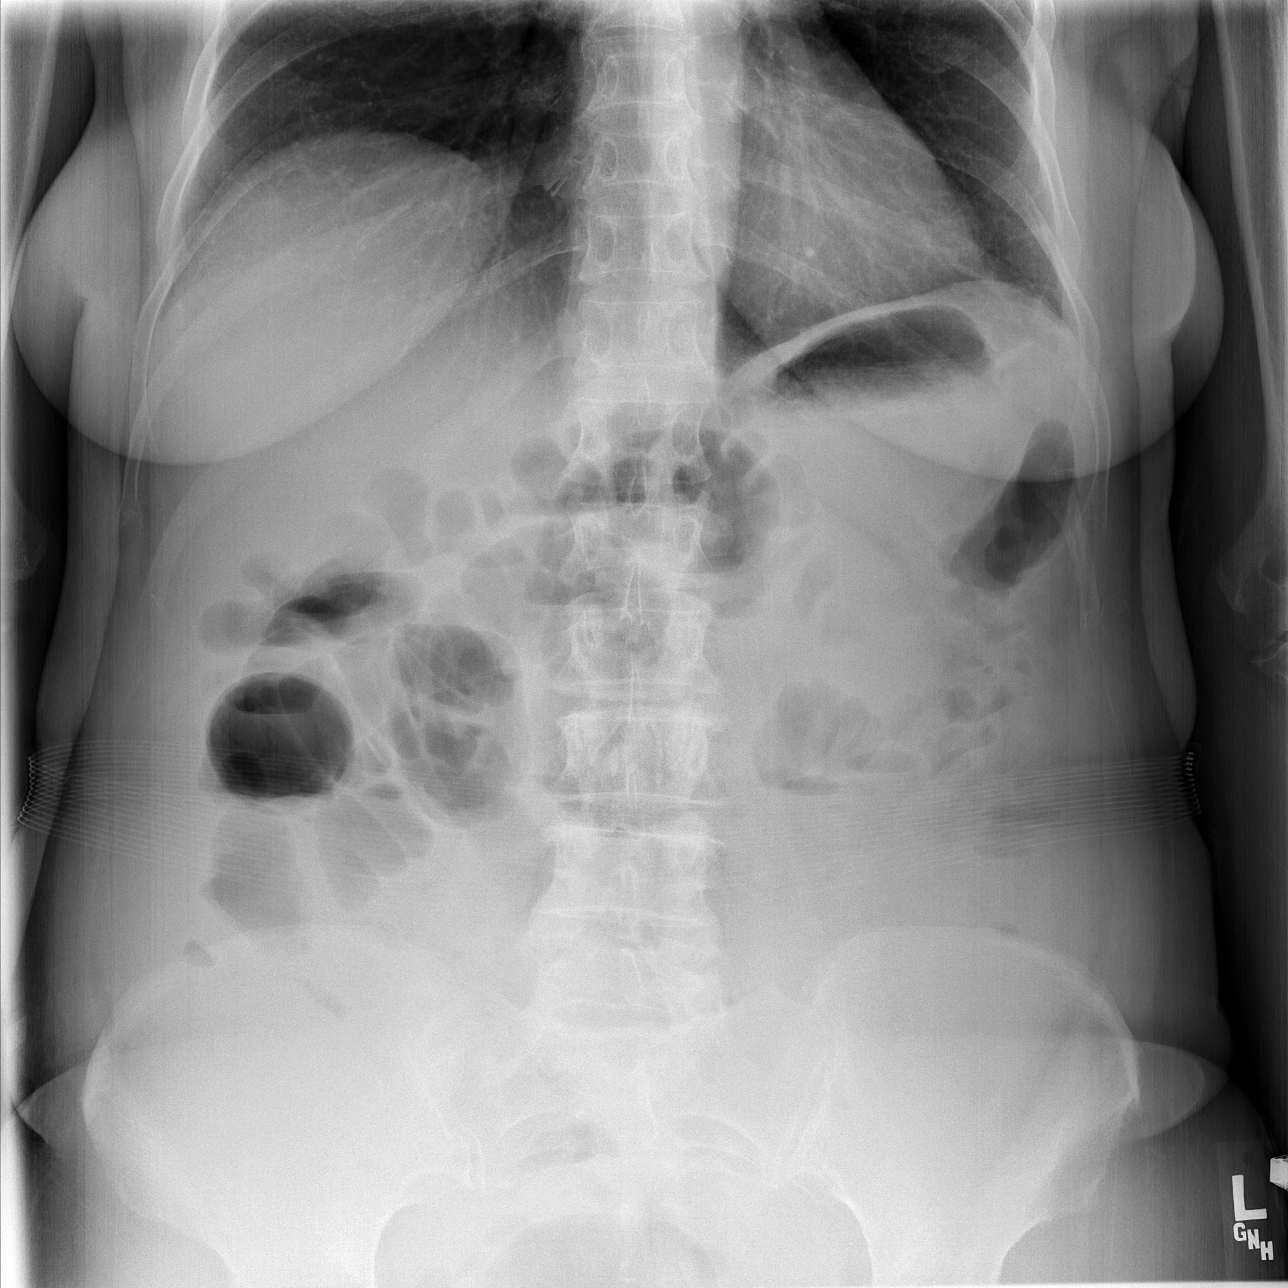

[t abdomen supine (1 of 2)]
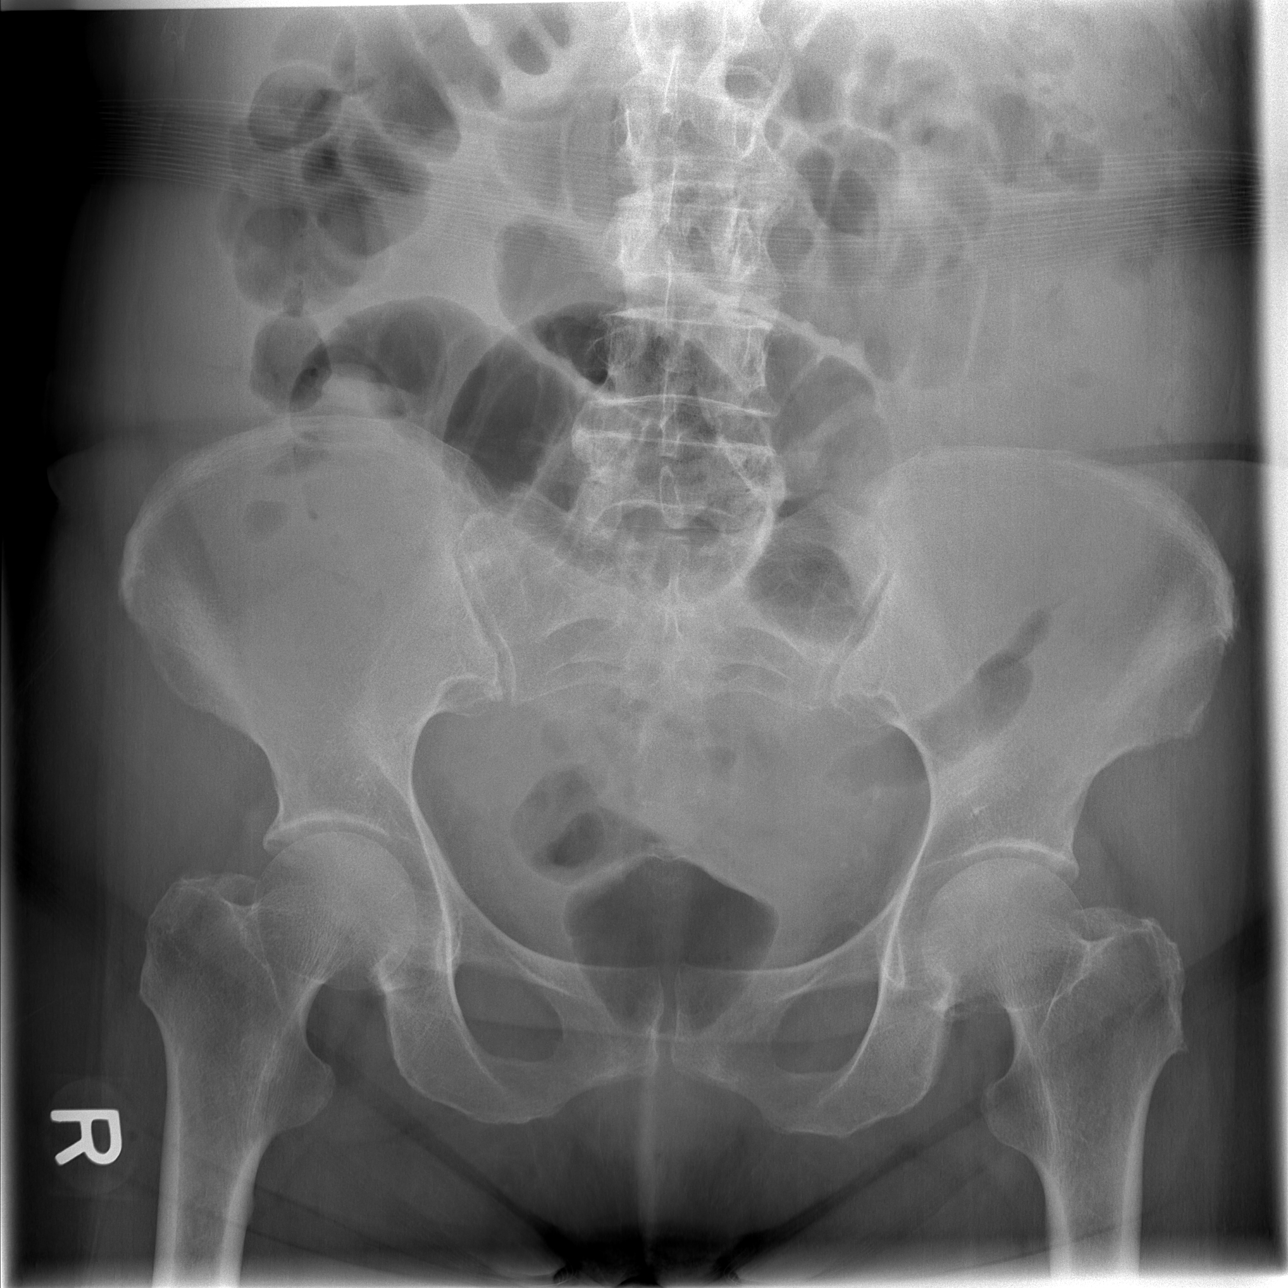

[t abdomen supine (2 of 2)]
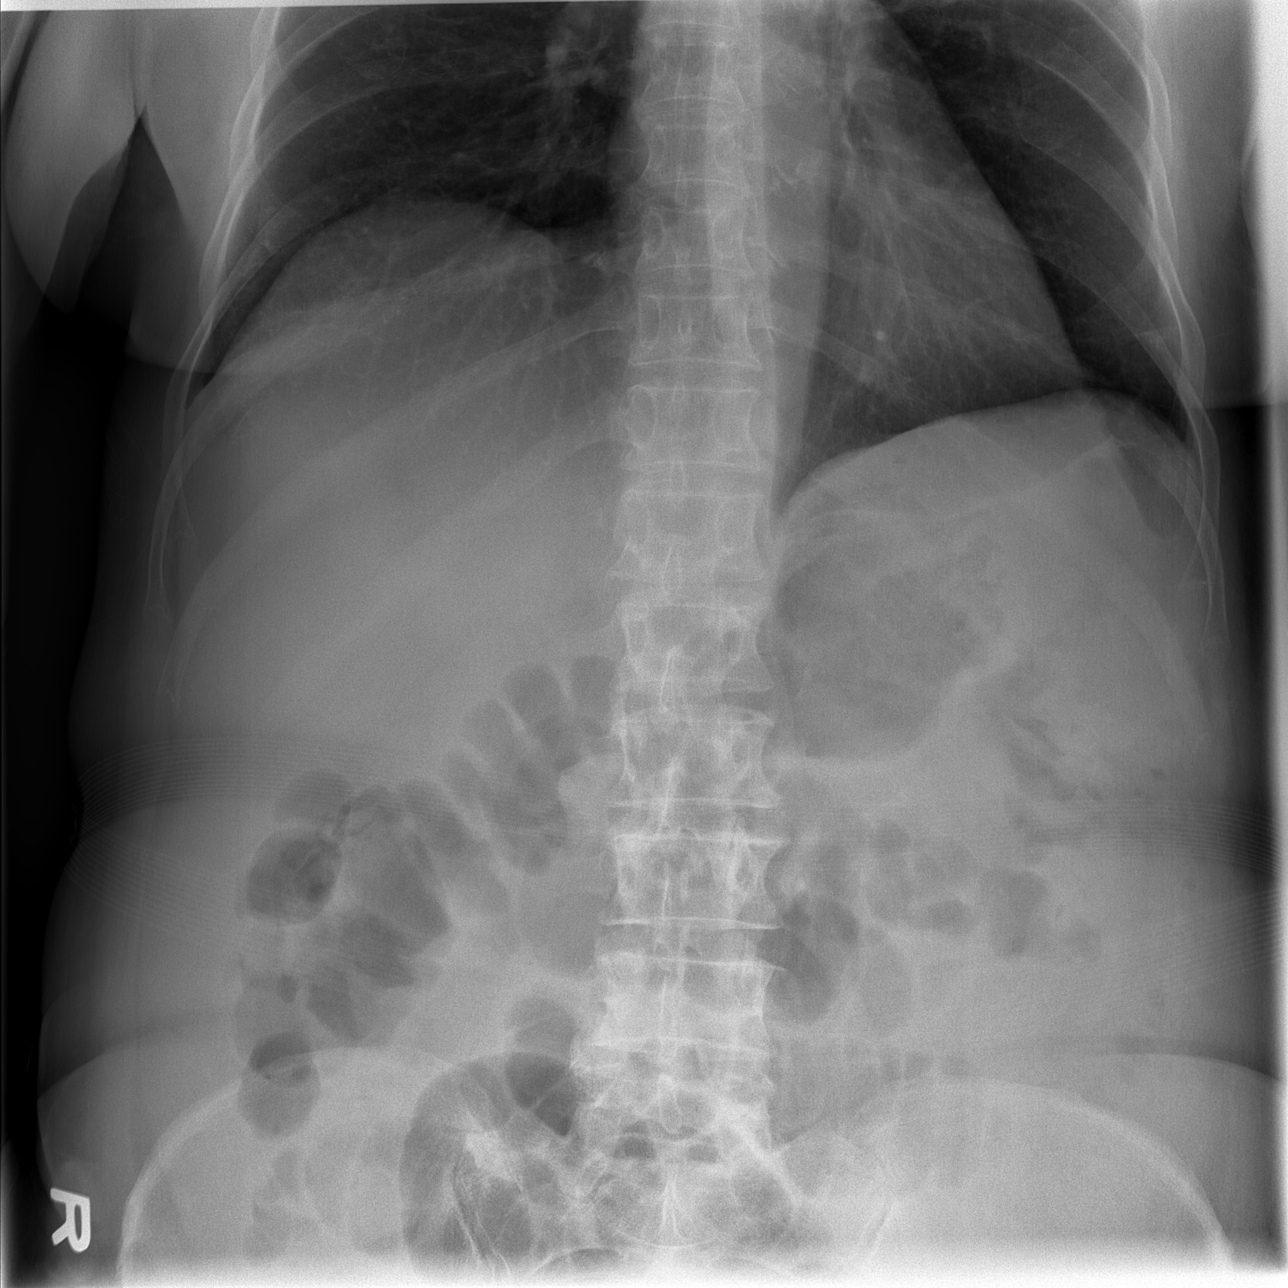

[3 of 3 positions shown; findings below may reference images not displayed]

FINDINGS: Mildly dilated small bowel loops are noted concerning for distal
small bowel obstruction or ileus. No colonic dilatation is noted.
There is no evidence of free air. No radio-opaque calculi or other
significant radiographic abnormality is seen.
IMPRESSION: Mildly dilated small bowel loops are noted concerning for distal
small bowel obstruction or possibly ileus.

## 2020-02-02 ENCOUNTER — Other Ambulatory Visit: Payer: Self-pay | Admitting: Family Medicine

## 2020-02-02 DIAGNOSIS — F419 Anxiety disorder, unspecified: Secondary | ICD-10-CM

## 2020-02-02 MED ORDER — HYDROMORPHONE HCL 2 MG PO TABS: 2.0000 mg | ORAL_TABLET | Freq: Every day | ORAL | 0 refills | Status: AC | PRN

## 2020-02-02 NOTE — Telephone Encounter (Signed)
Rx sent. Thanks, BJ 

## 2020-02-05 ENCOUNTER — Other Ambulatory Visit (HOSPITAL_COMMUNITY): Payer: Self-pay

## 2020-02-05 ENCOUNTER — Telehealth: Payer: Self-pay | Admitting: Family Medicine

## 2020-02-05 ENCOUNTER — Encounter: Payer: Self-pay | Admitting: Cardiovascular Disease

## 2020-02-05 ENCOUNTER — Other Ambulatory Visit: Payer: Self-pay

## 2020-02-05 ENCOUNTER — Ambulatory Visit: Payer: PPO | Admitting: Cardiovascular Disease

## 2020-02-05 DIAGNOSIS — C561 Malignant neoplasm of right ovary: Secondary | ICD-10-CM | POA: Diagnosis not present

## 2020-02-05 DIAGNOSIS — E039 Hypothyroidism, unspecified: Secondary | ICD-10-CM

## 2020-02-05 DIAGNOSIS — R188 Other ascites: Secondary | ICD-10-CM

## 2020-02-05 DIAGNOSIS — I10 Essential (primary) hypertension: Secondary | ICD-10-CM

## 2020-02-05 DIAGNOSIS — I5189 Other ill-defined heart diseases: Secondary | ICD-10-CM | POA: Diagnosis not present

## 2020-02-05 DIAGNOSIS — I480 Paroxysmal atrial fibrillation: Secondary | ICD-10-CM

## 2020-02-05 DIAGNOSIS — Z7901 Long term (current) use of anticoagulants: Secondary | ICD-10-CM | POA: Diagnosis not present

## 2020-02-05 DIAGNOSIS — I341 Nonrheumatic mitral (valve) prolapse: Secondary | ICD-10-CM | POA: Diagnosis not present

## 2020-02-05 NOTE — Progress Notes (Unsigned)
Cardiology Office Note    Date:  02/06/2020   ID:  [place], [place] July 02, 1945, MRN [id number]  PCP:  Martin[place], Seda G, MD  Cardiologist:  Shelva Majestic, MD    History of Present Illness:  Haley [name] is a 75 y.o. female who resents for 4 -month cardiology follow-up evaluation.    Haley [name] has a history of palpitations, paroxysmal atrial fibrillation, hyperlipidemia, GERD, as well as hypothyroidism. She also has a history of sleep apnea, currently untreated. Since I last saw her, she unfortunately was diagnosed with metastatic ovarian CA and has undergone extensive GYN and oncological evaluation.A preoperative chest CT did not show active cardiopulmonary diseaseand there was no evidence for large central pulmonary embolus. She was noted to have aortic atherosclerosis without aneurysm or dissection.On March 27, 2018 she underwent an exploratory laparotomy with bilateral salpingo-oophorectomy, omentectomy, radical tumor debulking for ovariancancer by Dr. Denman George.She is followed by Dr. Candida Peeling so far has had 2 cycles of chemotherapy. She admits to being fatigued and weak following the treatments but ultimately improves.Prior to her exploratory laparotomy surgery, she underwent a myocardial perfusion study which essentially was normal and did not show evidence for scar or ischemia. Ejection fraction was 57%. She was given operative clearance.  She was evaluated by me in a telemedicine visit on April 24, 2018.  At that time she was feeling fairly well and deniedany episodes of chest tightness or pressure. She does admit to some fatigue. She is unaware of any significant recent palpitations. She states her blood pressure typically was running in the 130/70 range. Her pulse typically is in the 60s to 70s. She denied presyncope or syncope or significant swelling. She admits to some abdominal discomfort. She also has a history of recent neuropathy.  I  saw her in a  telemedicine evaluation in August 2020.  She has been monitored by Dr. Alvy Bimler for her right ovarian epithelial cancer. She developed small petechial hemorrhages at the anterior shin area which were felt likely secondary to bevacizumab.  In addition, her blood pressure had begun to increase in hydrochlorothiazide was added to her medical regimen of amlodipine and metoprolol.  She does experience rare palpitations.  She was sleeping well but at times notes some palpitations in the early morning.  Remotely, she was diagnosed with sleep apnea and apparently did not tolerate CPAP therapy.  She denied any chest tightness.  She admitted to increased stress and at times this resulted in significant blood pressure lability.  She has been undergoing treatment by Dr. Alvy Bimler.  Apparently, the patient had self discontinued amlodipine.  An echo Doppler study of October 16, 2018 showed an EF of 60 to 65%.  The average left and a global longitudinal strain was -22.4.  Atrial dimensions were normal.  There was trace MR and mild TR.  There was mild aortic sclerosis without stenosis.  Estimated RV systolic pressure was 73.2 mmHg.    I saw her in November 2020.  She had been seen by Dr. Alvy Bimler 2 days previously and due to a serum sodium of 128 it was advised that she hold HCTZ as well as spironolactone.  She stopped taking spironolactone, but was unaware that she should hold HCTZ.  When I saw her, her blood pressure was elevated and I recommended resumption of a low-dose of amlodipine 5 mg for which she had tolerated in the past and  recommended to hold the HCTZ.  When she was seen in April 2021 by me she  felt well and denied any chest pain, dyspnea, or palpitations..  She was continuing to be followed closely by  Dr. Alvy Bimler.  She has been tolerating treatment of her right ovarian epithelial cancer with the exception of pancytopenia development.  Dr. Alvy Bimler has reduced the dose of her chemotherapy and has modified her  treatment plan.   Unfortunately, it appears that her cancer has become refractory to treatment.  Her cancer relapse soon after discontinuance of adjuvant carboplatin and Taxol.  She had numerous side effects with Doxil and Avastin.  She subsequently was started on gemcitabine but unfortunately CT imaging showed progression of disease.  There has been discussion with Dr. Alvy Bimler concerning discontinuance of treatment.  Patient sought another opinion and recently was evaluated by Dr. Delton Coombes and is considering a fourth line option with Topotecan but has not yet made a decision.  She has also recently seen Dr. Everitt Amber of GYN oncology.  I last saw her in September 2021.  At times she was unaware of any chest pain or palpitations.  She continued to smoke occasional small cigars.  She had noticed her blood pressure recently being slightly elevated.  She denies shortness of breath.  She has had progressive axillary adenopathy left greater than right.  During that evaluation, her blood pressure was elevated at 158/74 spite taking metoprolol succinate 50 mg.  She was no longer taking amlodipine or diuretic I recommended initiation of low-dose losartan 25 mg.  I also recommended a follow-up echo Doppler study to reassess LV function following her chemotherapy.  Echo Doppler study was done on October 11, 2019 which showed an EF of 60 to 41%, grade 2 diastolic dysfunction, moderate biatrial enlargement, mild late systolic prolapse of both leaflets of the mitral valve with an mild to moderate tricuspid regurgitation.  Compared to a prior study there was slight additional dilation of her atrium entered tricuspid regurgitation had slightly progressed.  For the last several months, he has continued to have progressive neuropathy.  She now sees Dr. Delton Coombes for oncology in [facility].  She completely quit tobacco 3 months ago.  At a recent evaluation discussed treatment options including hospice versus palliative  chemotherapy with topotecan.  She is planning to initiate hospice.  She denies any anginal symptoms.  She is scheduled to undergo an ultrasound paracentesis on Friday, January 28.  She presents for follow-up neurologic evaluation.  Past Medical History:  Diagnosis Date  . Allergy    SEASONAL  . Anxiety   . Cataract    BILATERAL  . Dysrhythmia   . Family history of lung cancer   . Family history of prostate cancer   . Family history of prostate cancer   . Family history of uterine cancer   . Fibromyalgia   . GERD (gastroesophageal reflux disease)   . H/O echocardiogram 04/28/08   EF>55% trace mitral regurgitation, No significant valvular pathology  . Hemorrhoids    internal  . History of stress test 02/08/2011   Normal Myocardial perfusion study, this is a low risk scan, No prior study available for comparison  . Hyperlipidemia   . Hypertension    hx of  . Hypothyroidism   . MVP (mitral valve prolapse)    mild and MR  . OA (osteoarthritis)   . Osteoporosis   . ovarian ca dx'd 12/2017   ovarian cancer  . Paroxysmal A-fib ([place])   . Sleep apnea    does not use prescribed CPAP  does not tolerate  . Thyroid  disease    HYPO  . Varicosities of leg   . Vitamin D deficiency     Past Surgical History:  Procedure Laterality Date  . ABDOMINAL HYSTERECTOMY  1989  . APPENDECTOMY  1989   with hysterectomy  . BILATERAL SALPINGECTOMY N/A 03/27/2018   Procedure: EXPLORATORY LAPARATOMY, BILATERAL SALPINGOOPHERECTOMY;  Surgeon: Everitt Amber, MD;  Location: [facility];  Service: Gynecology;  Laterality: N/A;  . BREAST EXCISIONAL BIOPSY Left 1995   Benign  . COLONOSCOPY  11-20-2000  . DEBULKING N/A 03/27/2018   Procedure: RADICAL TUMOR DEBULKING;  Surgeon: Everitt Amber, MD;  Location: [facility];  Service: Gynecology;  Laterality: N/A;  . IR IMAGING GUIDED PORT INSERTION  01/17/2018  . NOSE SURGERY  1990  . OMENTECTOMY N/A 03/27/2018   Procedure: OMENTECTOMY;  Surgeon: Everitt Amber, MD;  Location:  [facility];  Service: Gynecology;  Laterality: N/A;    Current Medications: Outpatient Medications Prior to Visit  Medication Sig Dispense Refill  . ALPRAZolam (XANAX) 0.5 MG tablet TAKE 1/2 TO 1 (ONE-HALF TO ONE) TABLET BY MOUTH ONCE DAILY AS NEEDED FOR ANXIETY 30 tablet 3  . alum & mag hydroxide-simeth (MAALOX/MYLANTA) 200-200-20 MG/5ML suspension Take 30 mLs by mouth every 6 (six) hours as needed for indigestion or heartburn. 355 mL 0  . Artificial Saliva (SALIVASURE) LOZG Use as directed 1 lozenge in the mouth or throat every 4 (four) hours as needed. 90 lozenge 0  . calcium carbonate (OSCAL) 1500 (600 Ca) MG TABS tablet Take 600 mg of elemental calcium by mouth daily.    . cholecalciferol (VITAMIN D) 1000 UNITS tablet Take 1,000 Units by mouth daily.    Marland Kitchen desonide (DESOWEN) 0.05 % ointment Apply 1 application topically 2 (two) times daily. For up to 2 weeks. 15 g 1  . DIGESTIVE ENZYMES PO Take 1 capsule by mouth daily.    Marland Kitchen estradiol (ESTRACE) 0.1 MG/GM vaginal cream Place 1 Applicatorful vaginally 3 (three) times a week. 42.5 g 12  . fluticasone (CUTIVATE) 0.05 % cream APPLY CREAM TOPICALLY UP TO TWICE DAILY AS NEEDED FOR RASH    . fluticasone (FLONASE) 50 MCG/ACT nasal spray Place 2 sprays into both nostrils daily. 48 g 2  . gabapentin (NEURONTIN) 300 MG capsule Take 1 capsule (300 mg total) by mouth 2 (two) times daily. (Patient taking differently: Take 300 mg by mouth 2 (two) times daily. Taking 1 capsule at bedtime) 60 capsule 3  . HYDROmorphone (DILAUDID) 2 MG tablet Take 1 tablet (2 mg total) by mouth daily as needed for severe pain. 30 tablet 0  . lactose free nutrition (BOOST PLUS) LIQD Take 237 mLs by mouth daily. 10 Can 0  . levothyroxine (SYNTHROID) 75 MCG tablet Take 1 tablet (75 mcg total) by mouth daily. (Patient taking differently: Take 75 mcg by mouth daily. Taking 1 tablet every day except 1/2 tablet on Tues and Thurs) 90 tablet 3  . losartan (COZAAR) 25 MG tablet Take 1 tablet  by mouth once daily 90 tablet 0  . Magnesium 400 MG TABS Take 250 mg by mouth daily.    . metoprolol succinate (TOPROL-XL) 50 MG 24 hr tablet Take 0.5 tablets (25 mg total) by mouth daily. Take with or immediately following a meal. 90 tablet 0  . Multiple Vitamins-Minerals (MULTIVITAMIN WITH MINERALS) tablet Take 1 tablet by mouth daily.    . Omega-3 Fatty Acids (FISH OIL) 1000 MG CAPS Take 2,000 mg by mouth in the morning and at bedtime.    Marland Kitchen omeprazole (  PRILOSEC) 40 MG capsule Take 1 capsule (40 mg total) by mouth daily. 90 capsule 3  . polyethylene glycol (MIRALAX / GLYCOLAX) packet Take 17 g by mouth daily. 14 each 0  . prochlorperazine (COMPAZINE) 10 MG tablet Take 1 tablet (10 mg total) by mouth every 6 (six) hours as needed. 30 tablet 3  . senna (SENOKOT) 8.6 MG TABS tablet Take 1 tablet (8.6 mg total) by mouth at bedtime as needed for mild constipation. 120 each 0  . simvastatin (ZOCOR) 20 MG tablet Take 1 tablet (20 mg total) by mouth at bedtime. 90 tablet 1  . tretinoin (RETIN-A) 0.025 % cream APPLY TOPICALLY TO FACE AT BEDTIME AS NEEDED    . vitamin B-12 (CYANOCOBALAMIN) 1000 MCG tablet Take 1,000 mcg by mouth every other day.    . vitamin C (ASCORBIC ACID) 500 MG tablet Take 1,000 mg by mouth daily.    . vitamin E 45 MG (100 UNITS) capsule Take by mouth daily.    [name] 20 MG TABS tablet TAKE 1 TABLET BY MOUTH ONCE DAILY WITH SUPPER 90 tablet 2  . zinc gluconate 50 MG tablet Take 50 mg by mouth daily.    . Menthol-Methyl Salicylate (MUSCLE RUB) 10-15 % CREA Apply 1 application topically as needed for muscle pain. (Patient not taking: Reported on 02/05/2020)     No facility-administered medications prior to visit.     Allergies:   Tramadol hcl   Social History   Socioeconomic History  . Marital status: Married    Spouse name: Not on file  . Number of children: 3  . Years of education: Not on file  . Highest education level: Not on file  Occupational History  . Occupation:  retired  Tobacco Use  . Smoking status: Former Smoker    Years: 10.00    Types: Cigars    Quit date: 10/24/2019    Years since quitting: 0.2  . Smokeless tobacco: Never Used  . Tobacco comment: not daily  [place] cigars 1-2   Vaping Use  . Vaping Use: Never used  Substance and Sexual Activity  . Alcohol use: No    Alcohol/week: 0.0 standard drinks  . Drug use: No  . Sexual activity: Not Currently    Comment: 1st intercourse 75yo-1 partner  Other Topics Concern  . Not on file  Social History Narrative  . Not on file   Social Determinants of Health   Financial Resource Strain: Low Risk   . Difficulty of Paying Living Expenses: Not hard at all  Food Insecurity: No Food Insecurity  . Worried About Charity fundraiser in the Last Year: Never true  . Ran Out of Food in the Last Year: Never true  Transportation Needs: No Transportation Needs  . Lack of Transportation (Medical): No  . Lack of Transportation (Non-Medical): No  Physical Activity: Inactive  . Days of Exercise per Week: 0 days  . Minutes of Exercise per Session: 0 min  Stress: Stress Concern Present  . Feeling of Stress : To some extent  Social Connections: Moderately Isolated  . Frequency of Communication with Friends and Family: More than three times a week  . Frequency of Social Gatherings with Friends and Family: Three times a week  . Attends Religious Services: Never  . Active Member of Clubs or Organizations: No  . Attends Archivist Meetings: Never  . Marital Status: Married     Family History:  The patient's family history includes Cancer in her  paternal aunt; Diabetes in her daughter and mother; Heart attack in her brother and brother; Heart defect in her daughter; Heart disease in her mother; Hypertension in her mother; Lung cancer in her niece; Prostate cancer in her paternal uncle; Prostatitis in her brother; Psoriasis in her daughter; Stroke in her maternal grandmother; Uterine cancer in her  cousin.   ROS General: Negative; No fevers, chills, or night sweats;  HEENT: Negative; No changes in vision or hearing, sinus congestion, difficulty swallowing Pulmonary: Negative; No cough, wheezing, shortness of breath, hemoptysis Cardiovascular: Negative; No chest pain, presyncope, syncope, palpitations GI: Negative; No nausea, vomiting, diarrhea, or abdominal pain GU: Negative; No dysuria, hematuria, or difficulty voiding Musculoskeletal: Negative; no myalgias, joint pain, or weakness Hematologic/Oncology:   Metastatic right ovarian epithelial cancer, axillary lymphadenopathy, pancytopenia Endocrine: Negative; no heat/cold intolerance; no diabetes Neuro: Negative; no changes in balance, headaches Skin: Negative; No rashes or skin lesions Psychiatric: Negative; No behavioral problems, depression Sleep: Negative; No snoring, daytime sleepiness, hypersomnolence, bruxism, restless legs, hypnogognic hallucinations, no cataplexy Other comprehensive 14 point system review is negative.   PHYSICAL EXAM:   VS:  BP 104/70 (BP Location: Right Arm, Patient Position: Sitting)   Pulse 62   Ht $R'5\' 2"'qn$  (1.575 m)   Wt 161 lb (73 kg)   SpO2 99%   BMI 29.45 kg/m     Repeat blood pressure by me was 110/64  Wt Readings from Last 3 Encounters:  02/05/20 161 lb (73 kg)  01/30/20 160 lb 9.6 oz (72.8 kg)  01/22/20 157 lb (71.2 kg)    General: Alert, oriented, no distress.  Skin: normal turgor, no rashes, warm and dry HEENT: Normocephalic, atraumatic. Pupils equal round and reactive to light; sclera anicteric; extraocular muscles intact;  Nose without nasal septal hypertrophy Mouth/Parynx benign; Mallinpatti scale 3 Neck: No JVD, no carotid bruits; normal carotid upstroke Lungs: clear to ausculatation and percussion; no wheezing or rales Chest wall: without tenderness to palpitation Heart: PMI not displaced, RRR, s1 s2 normal, 1/6 systolic murmur, no diastolic murmur, no rubs, gallops, thrills, or  heaves Abdomen: Distended abdomen suggestive of ascites; soft, nontender; no hepatosplenomehaly, BS+; abdominal aorta nontender and not dilated by palpation. Back: no CVA tenderness Pulses 2+ Musculoskeletal: full range of motion, normal strength, no joint deformities Extremities: no clubbing cyanosis or edema, Homan's sign negative  Neurologic: grossly nonfocal; Cranial nerves grossly wnl Psychologic: Normal mood and affect    Studies/Labs Reviewed:   ECG (independently read by me): NSR at 62; QS V1-21  September ECG (independently read by me): Sinus bradycardia at 51 with mild ainua arrythmia, QS V1-2  April 2021ECG (independently read by me): Sinus bradycardia at 56 bpm.  QS V1 V2.  No ectopy.  Normal intervals.  November 2020 ECG (independently read by me): Sinus bradycardia at 49 bpm.  Left axis deviation.  QS complex V1 V2.  March 08, 2018 ECG (independently read by me): Sinus bradycardia 59 bpm, QS complex anteroseptally  Recent Labs: BMP Latest Ref Rng & Units 01/21/2020 12/31/2019 12/09/2019  Glucose 70 - 99 mg/dL 92 86 104(H)  BUN 8 - 23 mg/dL $Remove'16 15 14  'oRvRVQO$ Creatinine 0.44 - 1.00 mg/dL 0.85 0.81 0.86  BUN/Creat Ratio 12 - 28 - - -  Sodium 135 - 145 mmol/L 133(L) 136 135  Potassium 3.5 - 5.1 mmol/L 3.9 3.9 3.3(L)  Chloride 98 - 111 mmol/L 101 103 103  CO2 22 - 32 mmol/L $RemoveB'25 26 25  'BhVIuwqT$ Calcium 8.9 - 10.3 mg/dL 8.9 8.9 8.8(L)  Hepatic Function Latest Ref Rng & Units 01/21/2020 12/31/2019 12/09/2019  Total Protein 6.5 - 8.1 g/dL 7.3 7.3 7.5  Albumin 3.5 - 5.0 g/dL 3.5 3.5 3.6  AST 15 - 41 U/L 42(H) 27 26  ALT 0 - 44 U/L $Remo'19 17 19  'nWXcC$ Alk Phosphatase 38 - 126 U/L 75 67 74  Total Bilirubin 0.3 - 1.2 mg/dL 0.6 0.5 0.6  Bilirubin, Direct 0.0 - 0.3 mg/dL - - -    CBC Latest Ref Rng & Units 01/21/2020 12/31/2019 12/09/2019  WBC 4.0 - 10.5 K/uL 7.8 6.5 6.5  Hemoglobin 12.0 - 15.0 g/dL 10.9(L) 10.5(L) 11.0(L)  Hematocrit 36.0 - 46.0 % 35.0(L) 33.3(L) 34.7(L)  Platelets 150 -  400 K/uL 223 186 185   Lab Results  Component Value Date   MCV 94.1 01/21/2020   MCV 94.3 12/31/2019   MCV 93.3 12/09/2019   Lab Results  Component Value Date   TSH 7.555 (H) 01/21/2020   Lab Results  Component Value Date   HGBA1C 6.0 08/06/2012     BNP No results found for: BNP  ProBNP No results found for: PROBNP   Lipid Panel     Component Value Date/Time   CHOL 186 11/13/2019 1033   TRIG 147 11/13/2019 1033   HDL 71 11/13/2019 1033   CHOLHDL 2.6 11/13/2019 1033   CHOLHDL 3 11/03/2017 1019   VLDL 30.8 11/03/2017 1019   LDLCALC 90 11/13/2019 1033   LDLDIRECT 84.3 12/16/2010 0921   LABVLDL 25 11/13/2019 1033     RADIOLOGY: CT CHEST ABDOMEN PELVIS W CONTRAST  Result Date: 01/17/2020 CLINICAL DATA:  Right ovarian cancer, status post BSO and omentectomy in 2020, chemotherapy and immunotherapy ongoing EXAM: CT CHEST, ABDOMEN, AND PELVIS WITH CONTRAST TECHNIQUE: Multidetector CT imaging of the chest, abdomen and pelvis was performed following the standard protocol during bolus administration of intravenous contrast. CONTRAST:  138mL OMNIPAQUE IOHEXOL 300 MG/ML  SOLN COMPARISON:  CT chest dated 12/09/2019. CT chest abdomen pelvis dated 10/03/2019. FINDINGS: CT CHEST FINDINGS Cardiovascular: Heart is normal in size.  No pericardial effusion. No evidence of thoracic aortic aneurysm. Atherosclerotic calcifications of the aortic arch. Right chest port terminates at the cavoatrial junction. Mediastinum/Nodes: Bulky axillary lymphadenopathy, measuring up to 2.4 cm short axis, previously 2.2 cm on the right and 2.0 cm on the left. Additional small thoracic nodes, including an 8 mm short axis node at the left thoracic inlet (series 2/image 6), an 8 mm short axis high right paratracheal node, and a 9 mm short axis low right paratracheal node, mildly progressive. 11 mm short axis right precardial node (series 2/image 42), unchanged. Lungs/Pleura: Mild biapical pleural-parenchymal scarring.  No suspicious pulmonary nodules. No focal consolidation. No pleural effusion or pneumothorax. Musculoskeletal: No focal osseous lesions. CT ABDOMEN PELVIS FINDINGS Hepatobiliary: Small left hepatic cysts measuring up to 13 mm (series 2/image 51). Gallbladder is unremarkable. No intrahepatic or extrahepatic ductal dilatation. Pancreas: Within normal limits. Spleen: Within normal limits. Adrenals/Urinary Tract: Adrenal glands are within normal limits. Kidneys are within normal limits.  No hydronephrosis. Thick-walled bladder, although underdistended. Stomach/Bowel: Stomach is notable for a tiny hiatal hernia. No evidence of bowel obstruction. Appendix is not discretely visualized. 3.6 cm serosal implant along the ascending colon (series 2/image 83), previously 2.6 cm. Vascular/Lymphatic: No evidence of abdominal aortic aneurysm. Atherosclerotic calcifications of the abdominal aorta and branch vessels. Upper abdominal/retroperitoneal lymphadenopathy, including: --19 mm short axis node in the porta hepatis (series 2/image 62), previously 10 mm --13 mm short axis left para-aortic  node (series 2/image 63), previously 12 mm Reproductive: Status post hysterectomy. No adnexal masses. Other: Mild loculated fluid in the right pelvis (series 2/image 108), new. Additional ascites along Morrison's pouch (series 2/image 70) and the left pericolic gutter (series 2/image 84), new. Peritoneal disease/omental caking beneath the anterior abdominal wall, new/progressive. Dominant 15 mm implant beneath the right anterior abdominal wall (series 2/image 76). Musculoskeletal: Mild degenerative changes of the lumbar spine. IMPRESSION: Bulky bilateral axillary nodal metastases, mildly progressive. Additional thoracic, upper abdominal, and retroperitoneal nodal metastases, mildly progressive. Dominant serosal implant along the ascending colon, progressive. Additional peritoneal disease/omental caking with abdominopelvic ascites,  new/progressive. Aortic Atherosclerosis (ICD10-I70.0). Electronically Signed   By: Julian Hy M.D.   On: 01/17/2020 09:47     Additional studies/ records that were reviewed today include:  07/11/2018 Echocardiogram   1. The left ventricle has normal systolic function with an ejection fraction of 60-65%. The cavity size was normal. Left ventricular diastolic parameters were normal. 2. Normal GLS -20.1. 3. The right ventricle has normal systolic function. The cavity was normal. There is no increase in right ventricular wall thickness. 4. The mitral valve is degenerative. Moderate thickening of the mitral valve leaflet. Moderate calcification of the mitral valve leaflet. 5. The aortic valve is tricuspid. Moderate thickening of the aortic valve. Moderate calcification of the aortic valve. Aortic valve regurgitation is trivial by color flow Doppler. 6. The aortic root is normal in size and structure.     ECHO 10/16/2018 IMPRESSIONS  1. No significant change from prior study (07/11/2018).  2. Left ventricular ejection fraction, by visual estimation, is 60 to 65%. The left ventricle has normal function. Normal left ventricular size. There is no left ventricular hypertrophy.  3. LVEF by 3D assessment 63%.  4. The average left ventricular global longitudinal strain is -22.4 %.  5. Global right ventricle has normal systolic function.The right ventricular size is normal. No increase in right ventricular wall thickness.  6. Left atrial size was normal.  7. Right atrial size was normal.  8. Presence of pericardial fat pad.  9. Moderate thickening of the mitral valve leaflet(s). 10. The mitral valve is normal in structure. Trace mitral valve regurgitation. 11. The tricuspid valve is normal in structure. Tricuspid valve regurgitation is mild. 12. Aortic regurgitation PHT measures 467 msec. 13. The aortic valve is tricuspid Aortic valve regurgitation is mild by color flow Doppler. Mild  aortic valve sclerosis without stenosis. 14. There is mild to moderate calcification of the AoV, with focal calcification of the [place]. The aortic regurgitation is mild in severity, likely related to degenerative valve disease. 15. The pulmonic valve was grossly normal. Pulmonic valve regurgitation is not visualized by color flow Doppler. 16. Mild plaque invoving the ascending aorta. 17. Normal pulmonary artery systolic pressure. 18. The tricuspid regurgitant velocity is 2.52 m/s, and with an assumed right atrial pressure of 3 mmHg, the estimated right ventricular systolic pressure is normal at 28.4 mmHg.   ECHO 10/11/2019 IMPRESSIONS  1. Left ventricular ejection fraction, by estimation, is 60 to 65%. The  left ventricle has normal function. The left ventricle has no regional  wall motion abnormalities. Left ventricular diastolic parameters are  consistent with Grade II diastolic  dysfunction (pseudonormalization).  2. Right ventricular systolic function is normal. The right ventricular  size is normal. There is normal pulmonary artery systolic pressure.  3. Left atrial size was moderately dilated.  4. Right atrial size was moderately dilated.  5. The mitral valve is abnormal. Trivial  mitral valve regurgitation. No  evidence of mitral stenosis. There is mild late systolic prolapse of both  leaflets of the mitral valve.  6. Tricuspid valve regurgitation is mild to moderate.  7. The aortic valve is tricuspid. There is mild calcification of the  aortic valve. Aortic valve regurgitation is trivial. Mild aortic valve  sclerosis is present, with no evidence of aortic valve stenosis.  8. Aortic dilatation noted. There is mild dilatation of the ascending  aorta, measuring 38 mm.  9. The inferior vena cava is normal in size with greater than 50%  respiratory variability, suggesting right atrial pressure of 3 mmHg.   ASSESSMENT:    1. Essential hypertension, benign   2. Grade II  diastolic dysfunction   3. Mitral valve prolapse   4. PAF (paroxysmal atrial fibrillation) ([place])   5. Chronic anticoagulation   6. Right ovarian epithelial cancer ([place])   7. Hypothyroidism, unspecified type     PLAN:  1.  Essential hypertension : Blood pressure is stable and 110/64 by me on losartan 25 mg and metoprolol succinate 25 mg daily.  I Reviewed her most recent echo Doppler study which showed an EF of 60 to 65% with grade 2 diastolic dysfunction.  There was moderate biatrial enlargement, mild late systolic prolapse of both leaflets of her mitral valve and mild to moderate TR.  2.  Palpitations: These have completely resolved with metoprolol succinate.  She is now on 25 mg.  ECG shows sinus rhythm in the 60s rather than previous sinus bradycardia.  There is no recurrent atrial fibrillation.  3.  Metastatic right ovarian epithelial cancer: She is now followed by Dr. Delton Coombes.  She failed Keytruda there has been discussion about starting infusions of topotecan.  She planing initiating initiating hospice.    4.  Hyperlipidemia: She has been on longstanding simvastatin in addition to over-the-counter fish oil.  She has documented aortic atherosclerosis.  5.  Hypothyroidism: She continues to be on levothyroxine 75 mcg.  TSH level on December 31, 2019 was 3.3 and apparently on January 21, 2020 was 7.5.  He is followed by Dr. Shey Martinique.  7.  Obstructive sleep apnea: She had moderate sleep apnea on a diagnostic polysomnogram and did not tolerate CPAP.  She believes she is sleeping well;  8.  Anticoagulated on Xarelto.  No recurrent atrial fibrillation  Medication Adjustments/Labs and Tests Ordered: Current medicines are reviewed at length with the patient today.  Concerns regarding medicines are outlined above.  Medication changes, Labs and Tests ordered today are listed in the Patient Instructions below. Patient Instructions  Medication Instructions:  The current medical regimen is  effective;  continue present plan and medications as directed. Please refer to the Current Medication list given to you today.  *If you need a refill on your cardiac medications before your next appointment, please call your pharmacy*  Lab Work:   Testing/Procedures:  NONE    N ONE  Follow-Up: Your next appointment:  6 month(s) In Person with Shelva Majestic, MD  CALL IN April TO [place], you and your health needs are our priority.  As part of our continuing mission to provide you with exceptional heart care, we have created designated Provider Care Teams.  These Care Teams include your primary Cardiologist (physician) and Advanced Practice Providers (APPs -  Physician Assistants and Nurse Practitioners) who all work together to provide you with the care you need, when you need it.  Signed, Shelva Majestic, MD  02/06/2020 8:19 AM    [facility] [address] [place], [place], [place]  [address] Phone: [phone]

## 2020-02-05 NOTE — Telephone Encounter (Signed)
She is following with oncologists, last seen on 01/30/2020. She has failed a few chemotherapy regimens. Reviewing records she has an order for paracentesis due to ascites for 02/07/20. Thanks, BJ

## 2020-02-05 NOTE — Patient Instructions (Addendum)
Medication Instructions:  The current medical regimen is effective;  continue present plan and medications as directed. Please refer to the Current Medication list given to you today.  *If you need a refill on your cardiac medications before your next appointment, please call your pharmacy*  Lab Work:   Testing/Procedures:  NONE    N ONE  Follow-Up: Your next appointment:  6 month(s) In Person with Shelva Majestic, MD  CALL IN April TO [place], you and your health needs are our priority.  As part of our continuing mission to provide you with exceptional heart care, we have created designated Provider Care Teams.  These Care Teams include your primary Cardiologist (physician) and Advanced Practice Providers (APPs -  Physician Assistants and Nurse Practitioners) who all work together to provide you with the care you need, when you need it.

## 2020-02-05 NOTE — Telephone Encounter (Signed)
Janine Associate Professor for [employer] and Hospice [phone]  She called to let Dr. Martinique know that the patient is on her way to getting into trouble.  She hasn't seen her oncologist in a year  She stopped chemo after the first round because she couldn't tolerate it.  She has abdominal extension and pain appetite decreased but has gained 9 pounds  She will not go to the ER  Patient is in denial of Hospice  She has taken so much [place] now she is pooping chalk  Sherlyn Hay was wanting to know if Dr. Martinique can place an order for abdominal X-Ray.  Please advise

## 2020-02-06 ENCOUNTER — Encounter: Payer: Self-pay | Admitting: Cardiovascular Disease

## 2020-02-07 ENCOUNTER — Other Ambulatory Visit: Payer: Self-pay

## 2020-02-07 ENCOUNTER — Encounter (HOSPITAL_COMMUNITY): Payer: Self-pay

## 2020-02-07 ENCOUNTER — Other Ambulatory Visit: Payer: Self-pay | Admitting: Cardiovascular Disease

## 2020-02-07 ENCOUNTER — Ambulatory Visit (HOSPITAL_COMMUNITY)
Admission: RE | Admit: 2020-02-07 | Discharge: 2020-02-07 | Disposition: A | Discharge status: Home / Self Care | Payer: PPO | Source: Ambulatory Visit | Attending: Hematology | Admitting: Hematology

## 2020-02-07 DIAGNOSIS — C561 Malignant neoplasm of right ovary: Secondary | ICD-10-CM | POA: Insufficient documentation

## 2020-02-07 DIAGNOSIS — R188 Other ascites: Secondary | ICD-10-CM | POA: Diagnosis not present

## 2020-02-07 NOTE — Sedation Documentation (Signed)
PT tolerated right sided paracentesis well today and 3 Liters of cloudy amber colored fluid removed. Pt vital signs remained stable at completion of procedure and pt denied any complaints. PT ambulatory at discharge and verbalized understanding of discharge instructions.

## 2020-02-07 NOTE — Telephone Encounter (Signed)
I spoke with Haley [name]. Pt's oncology provider has already ordered everything that needs to be ordered. Nothing further needed at this time.

## 2020-02-07 NOTE — Discharge Instructions (Signed)
Paracentesis, Care After This sheet gives you information about how to care for yourself after your procedure. Your health care provider may also give you more specific instructions. If you have problems or questions, contact your health care provider. What can I expect after the procedure? After the procedure, it is common to have a small amount of clear fluid coming from the puncture site. Follow these instructions at home: Puncture site care  Follow instructions from your health care provider about how to take care of your puncture site. Make sure you: ? Wash your hands with soap and water before and after you change your bandage (dressing). If soap and water are not available, use hand sanitizer. ? Change your dressing as told by your health care provider.  Check your puncture area every day for signs of infection. Check for: ? Redness, swelling, or pain. ? More fluid or blood. ? Warmth. ? Pus or a bad smell.   General instructions  Return to your normal activities as told by your health care provider. Ask your health care provider what activities are safe for you.  Take over-the-counter and prescription medicines only as told by your health care provider.  Do not take baths, swim, or use a hot tub until your health care provider approves. Ask your health care provider if you may take showers. You may only be allowed to take sponge baths.  Keep all follow-up visits as told by your health care provider. This is important. Contact a health care provider if:  You have redness, swelling, or pain at your puncture site.  You have more fluid or blood coming from your puncture site.  Your puncture site feels warm to the touch.  You have pus or a bad smell coming from your puncture site.  You have a fever. Get help right away if:  You have chest pain or shortness of breath.  You develop increasing pain, discomfort, or swelling in your abdomen.  You feel dizzy or light-headed or  you faint. Summary  After the procedure, it is common to have a small amount of clear fluid coming from the puncture site.  Follow instructions from your health care provider about how to take care of your puncture site.  Check your puncture area every day signs of infection.  Keep all follow-up visits as told by your health care provider. This information is not intended to replace advice given to you by your health care provider. Make sure you discuss any questions you have with your health care provider. Document Revised: 07/10/2018 Document Reviewed: 10/17/2017 Elsevier Patient Education  2021 [employer]vier Inc.  

## 2020-02-07 NOTE — Procedures (Signed)
PreOperative Dx: Malignant ascites Postoperative Dx: Malignant ascites Procedure:   US guided paracentesis Radiologist:  Thornton Papas Anesthesia:  10 ml of1% lidocaine Specimen:  3 L of cloudy amber ascitic fluid EBL:   < 1 ml Complications: None

## 2020-02-13 ENCOUNTER — Other Ambulatory Visit: Payer: Self-pay | Admitting: Student

## 2020-02-14 ENCOUNTER — Ambulatory Visit (HOSPITAL_COMMUNITY)
Admission: RE | Admit: 2020-02-14 | Discharge: 2020-02-14 | Disposition: A | Discharge status: Home / Self Care | Payer: PPO | Source: Ambulatory Visit | Attending: Hematology | Admitting: Hematology

## 2020-02-14 ENCOUNTER — Encounter (HOSPITAL_COMMUNITY): Payer: Self-pay

## 2020-02-14 ENCOUNTER — Other Ambulatory Visit: Payer: Self-pay

## 2020-02-14 DIAGNOSIS — Z79890 Hormone replacement therapy (postmenopausal): Secondary | ICD-10-CM | POA: Insufficient documentation

## 2020-02-14 DIAGNOSIS — C561 Malignant neoplasm of right ovary: Secondary | ICD-10-CM | POA: Diagnosis not present

## 2020-02-14 DIAGNOSIS — R188 Other ascites: Secondary | ICD-10-CM | POA: Insufficient documentation

## 2020-02-14 DIAGNOSIS — Z79899 Other long term (current) drug therapy: Secondary | ICD-10-CM | POA: Diagnosis not present

## 2020-02-14 DIAGNOSIS — C569 Malignant neoplasm of unspecified ovary: Secondary | ICD-10-CM | POA: Diagnosis not present

## 2020-02-14 DIAGNOSIS — Z7901 Long term (current) use of anticoagulants: Secondary | ICD-10-CM | POA: Diagnosis not present

## 2020-02-14 DIAGNOSIS — R180 Malignant ascites: Secondary | ICD-10-CM | POA: Diagnosis not present

## 2020-02-14 DIAGNOSIS — Z4902 Encounter for fitting and adjustment of peritoneal dialysis catheter: Secondary | ICD-10-CM | POA: Diagnosis not present

## 2020-02-14 LAB — CBC
HCT: 35.2 % — ABNORMAL LOW (ref 36.0–46.0)
Hemoglobin: 11.2 g/dL — ABNORMAL LOW (ref 12.0–15.0)
MCH: 29.2 pg (ref 26.0–34.0)
MCHC: 31.8 g/dL (ref 30.0–36.0)
MCV: 91.7 fL (ref 80.0–100.0)
Platelets: 230 10*3/uL (ref 150–400)
RBC: 3.84 MIL/uL — ABNORMAL LOW (ref 3.87–5.11)
RDW: 13.7 % (ref 11.5–15.5)
WBC: 7.3 10*3/uL (ref 4.0–10.5)
nRBC: 0 % (ref 0.0–0.2)

## 2020-02-14 LAB — PROTIME-INR
INR: 1 (ref 0.8–1.2)
Prothrombin Time: 12.7 seconds (ref 11.4–15.2)

## 2020-02-14 MED ORDER — IOHEXOL 300 MG/ML  SOLN: 50.0000 mL | Freq: Once | INTRAMUSCULAR | Status: DC | PRN

## 2020-02-14 MED ORDER — MIDAZOLAM HCL 2 MG/2ML IJ SOLN
INTRAMUSCULAR | Status: AC
  Filled 2020-02-14: qty 2

## 2020-02-14 MED ORDER — LIDOCAINE HCL 1 % IJ SOLN
INTRAMUSCULAR | Status: DC | PRN
  Administered 2020-02-14: 10 mL

## 2020-02-14 MED ORDER — LIDOCAINE-EPINEPHRINE 1 %-1:100000 IJ SOLN
INTRAMUSCULAR | Status: AC
  Filled 2020-02-14: qty 1

## 2020-02-14 MED ORDER — FENTANYL CITRATE (PF) 100 MCG/2ML IJ SOLN
INTRAMUSCULAR | Status: AC
  Filled 2020-02-14: qty 2

## 2020-02-14 MED ORDER — SODIUM CHLORIDE 0.9 % IV SOLN: INTRAVENOUS | Status: DC

## 2020-02-14 NOTE — H&P (Signed)
Referring Physician(s): Derek Jack  Supervising Physician: Ruthann Cancer  Patient Status:  [facility]  Chief Complaint: "I'm having a tube put in my belly"   Subjective: Patient familiar to IR service from multiple paracenteses, latest on 02/07/2020 yielding 3 L.  She has also had prior Port-A-Cath placement in 2020.  She has a history of ovarian cancer with prior surgery and chemotherapy diagnosed in 2020.  She now has disease progression and is transitioning to hospice care.  She presents today for tunneled peritoneal catheter placement to assist with drainage of recurrent ascites.  She currently denies fever, headache, chest pain, dyspnea, cough, back pain, vomiting or bleeding.  She does have some abdominal discomfort/distention/occasional indigestion/nausea.  Additional medical history as below.  Past Medical History:  Diagnosis Date  . Allergy    SEASONAL  . Anxiety   . Cataract    BILATERAL  . Dysrhythmia   . Family history of lung cancer   . Family history of prostate cancer   . Family history of prostate cancer   . Family history of uterine cancer   . Fibromyalgia   . GERD (gastroesophageal reflux disease)   . H/O echocardiogram 04/28/08   EF>55% trace mitral regurgitation, No significant valvular pathology  . Hemorrhoids    internal  . History of stress test 02/08/2011   Normal Myocardial perfusion study, this is a low risk scan, No prior study available for comparison  . Hyperlipidemia   . Hypertension    hx of  . Hypothyroidism   . MVP (mitral valve prolapse)    mild and MR  . OA (osteoarthritis)   . Osteoporosis   . ovarian ca dx'd 12/2017   ovarian cancer  . Paroxysmal A-fib ([place])   . Sleep apnea    does not use prescribed CPAP  does not tolerate  . Thyroid disease    HYPO  . Varicosities of leg   . Vitamin D deficiency    Past Surgical History:  Procedure Laterality Date  . ABDOMINAL HYSTERECTOMY  1989  . APPENDECTOMY  1989   with  hysterectomy  . BILATERAL SALPINGECTOMY N/A 03/27/2018   Procedure: EXPLORATORY LAPARATOMY, BILATERAL SALPINGOOPHERECTOMY;  Surgeon: Everitt Amber, MD;  Location: [facility];  Service: Gynecology;  Laterality: N/A;  . BREAST EXCISIONAL BIOPSY Left 1995   Benign  . COLONOSCOPY  11-20-2000  . DEBULKING N/A 03/27/2018   Procedure: RADICAL TUMOR DEBULKING;  Surgeon: Everitt Amber, MD;  Location: [facility];  Service: Gynecology;  Laterality: N/A;  . IR IMAGING GUIDED PORT INSERTION  01/17/2018  . NOSE SURGERY  1990  . OMENTECTOMY N/A 03/27/2018   Procedure: OMENTECTOMY;  Surgeon: Everitt Amber, MD;  Location: [facility];  Service: Gynecology;  Laterality: N/A;     Allergies: Tramadol hcl  Medications: Prior to Admission medications   Medication Sig Start Date End Date Taking? Authorizing Provider  ALPRAZolam (XANAX) 0.5 MG tablet TAKE 1/2 TO 1 (ONE-HALF TO ONE) TABLET BY MOUTH ONCE DAILY AS NEEDED FOR ANXIETY 12/04/19  Yes Martinique, Jaslynne G, MD  alum & mag hydroxide-simeth (MAALOX/MYLANTA) 200-200-20 MG/5ML suspension Take 30 mLs by mouth every 6 (six) hours as needed for indigestion or heartburn. 04/01/18  Yes Everitt Amber, MD  Artificial Saliva [facility]) LOZG Use as directed 1 lozenge in the mouth or throat every 4 (four) hours as needed. 02/08/19  Yes Martinique, Cheyla G, MD  calcium carbonate (OSCAL) 1500 (600 Ca) MG TABS tablet Take 600 mg of elemental calcium by mouth daily.  Yes [provider]  cholecalciferol (VITAMIN D) 1000 UNITS tablet Take 1,000 Units by mouth daily.   Yes [provider]  DIGESTIVE ENZYMES PO Take 1 capsule by mouth daily.   Yes [provider]  estradiol (ESTRACE) 0.1 MG/GM vaginal cream Place 1 Applicatorful vaginally 3 (three) times a week. 01/30/19  Yes Gorsuch, Ni, MD  fluticasone (FLONASE) 50 MCG/ACT nasal spray Place 2 sprays into both nostrils daily. 02/20/19  Yes Martinique, Antonia G, MD  gabapentin (NEURONTIN) 300 MG capsule Take 1 capsule (300 mg total) by  mouth 2 (two) times daily. Patient taking differently: Take 300 mg by mouth 2 (two) times daily. Taking 1 capsule at bedtime 10/29/19  Yes Martinique, Eliot G, MD  HYDROmorphone (DILAUDID) 2 MG tablet Take 1 tablet (2 mg total) by mouth daily as needed for severe pain. 02/02/20  Yes Martinique, Brylin G, MD  lactose free nutrition (BOOST PLUS) LIQD Take 237 mLs by mouth daily. 02/02/18  Yes Kayleen Memos, DO  levothyroxine (SYNTHROID) 75 MCG tablet Take 1 tablet (75 mcg total) by mouth daily. Patient taking differently: Take 75 mcg by mouth daily. Taking 1 tablet every day except 1/2 tablet on Tues and Thurs 12/13/19  Yes Martinique, Hortence G, MD  losartan (COZAAR) 25 MG tablet Take 1 tablet by mouth once daily 02/07/20  Yes Troy Sine, MD  Magnesium 400 MG TABS Take 250 mg by mouth daily.   Yes [provider]  metoprolol succinate (TOPROL-XL) 50 MG 24 hr tablet Take 0.5 tablets (25 mg total) by mouth daily. Take with or immediately following a meal. 01/22/20  Yes Troy Sine, MD  Multiple Vitamins-Minerals (MULTIVITAMIN WITH MINERALS) tablet Take 1 tablet by mouth daily.   Yes [provider]  Omega-3 Fatty Acids (FISH OIL) 1000 MG CAPS Take 2,000 mg by mouth in the morning and at bedtime.   Yes [provider]  omeprazole (PRILOSEC) 40 MG capsule Take 1 capsule (40 mg total) by mouth daily. 06/19/19  Yes Martinique, Kianni G, MD  polyethylene glycol [facility] Floria Raveling) packet Take 17 g by mouth daily. 02/02/18  Yes Kayleen Memos, DO  senna (SENOKOT) 8.6 MG TABS tablet Take 1 tablet (8.6 mg total) by mouth at bedtime as needed for mild constipation. 04/01/18  Yes Everitt Amber, MD  simvastatin (ZOCOR) 20 MG tablet Take 1 tablet (20 mg total) by mouth at bedtime. 07/30/19  Yes Kilroy, Luke K, PA-C  vitamin B-12 (CYANOCOBALAMIN) 1000 MCG tablet Take 1,000 mcg by mouth every other day.   Yes [provider]  vitamin C (ASCORBIC ACID) 500 MG tablet Take 1,000 mg by mouth daily.   Yes  [provider]  vitamin E 45 MG (100 UNITS) capsule Take by mouth daily.   Yes [provider]  zinc gluconate 50 MG tablet Take 50 mg by mouth daily.   Yes [provider]  desonide (DESOWEN) 0.05 % ointment Apply 1 application topically 2 (two) times daily. For up to 2 weeks. 01/22/20   Martinique, Mahrukh G, MD  fluticasone (CUTIVATE) 0.05 % cream APPLY CREAM TOPICALLY UP TO TWICE DAILY AS NEEDED FOR RASH 03/28/19   [provider]  Menthol-Methyl Salicylate (MUSCLE RUB) 10-15 % CREA Apply 1 application topically as needed for muscle pain. Patient not taking: Reported on 02/05/2020    [provider]  prochlorperazine (COMPAZINE) 10 MG tablet Take 1 tablet (10 mg total) by mouth every 6 (six) hours as needed. 01/21/20  Derek Jack, MD  tretinoin (RETIN-A) 0.025 % cream APPLY TOPICALLY TO FACE AT BEDTIME AS NEEDED 03/28/19   [provider]  XARELTO 20 MG TABS tablet TAKE 1 TABLET BY MOUTH ONCE DAILY WITH SUPPER 08/27/19   Troy Sine, MD     Vital Signs: BP 111/61   Pulse (!) 53   Temp 98.4 F (36.9 C) (Oral)   Resp 16   SpO2 94%   Physical Exam awake, alert.  Chest clear to auscultation bilaterally.  Intact right chest wall Port-A-Cath.  Heart with bradycardic but regular rhythm.  Abdomen soft, slightly distended, some mild diffuse tenderness to palpation.  Trace pretibial edema bilaterally, slightly more so on left.  Imaging: No results found.  Labs:  CBC: Recent Labs    11/18/19 0857 12/09/19 0837 12/31/19 1204 01/21/20 0950  WBC 6.2 6.5 6.5 7.8  HGB 11.0* 11.0* 10.5* 10.9*  HCT 35.0* 34.7* 33.3* 35.0*  PLT 176 185 186 223    COAGS: No results for input(s): INR, APTT in the last 8760 hours.  BMP: Recent Labs    06/03/19 0918 06/13/19 0820 06/13/19 0820 09/09/19 1016 11/13/19 1033 11/18/19 0857 12/09/19 0837 12/31/19 1204 01/21/20 0950  NA 141 141  --  137 139 136 135 136 133*  K 3.8 4.0  --  4.3  4.8 4.1 3.3* 3.9 3.9  CL 107 108  --  105 102 104 103 103 101  CO2 27 25  --  26 25 26 25 26 25   GLUCOSE 113* 96  --  98 92 92 104* 86 92  BUN 14 11  --  11 13 12 14 15 16   CALCIUM 8.9 9.2  --  8.6* 9.4 9.1 8.8* 8.9 8.9  CREATININE 0.81 0.81   < > 0.71 0.92 0.79 0.86 0.81 0.85  GFRNONAA >60 >60   < > >60 61 >60 >60 >60 >60  GFRAA >60 >60  --  >60 70  --   --   --   --    < > = values in this interval not displayed.    LIVER FUNCTION TESTS: Recent Labs    11/18/19 0857 12/09/19 0837 12/31/19 1204 01/21/20 0950  BILITOT 0.5 0.6 0.5 0.6  AST 22 26 27  42*  ALT 19 19 17 19   ALKPHOS 64 74 67 75  PROT 6.8 7.5 7.3 7.3  ALBUMIN 3.5 3.6 3.5 3.5    Assessment and Plan: Patient familiar to IR service from multiple paracenteses, latest on 02/07/2020 yielding 3 L.  She has also had prior Port-A-Cath placement in 2020.  She has a history of ovarian cancer with prior surgery and chemotherapy diagnosed in 2020.  She now has disease progression and is transitioning to hospice care.  She presents today for tunneled peritoneal catheter placement to assist with drainage of recurrent ascites.  Risks and benefits discussed with the patient including bleeding, infection, damage to adjacent structures, bowel perforation/fistula connection, and sepsis as well as inability to place drain secondary to minimal fluid.  All of the patient's questions were answered, patient is agreeable to proceed. Consent signed and in chart.     Electronically Signed: D. Rowe Robert, PA-C 02/14/2020, 1:10 PM   I spent a total of 25 minutes at the the patient's bedside AND on the patient's hospital floor or unit, greater than 50% of which was counseling/coordinating care for tunneled peritoneal drain placement

## 2020-02-17 DIAGNOSIS — L570 Actinic keratosis: Secondary | ICD-10-CM | POA: Diagnosis not present

## 2020-02-17 DIAGNOSIS — B351 Tinea unguium: Secondary | ICD-10-CM | POA: Diagnosis not present

## 2020-02-17 DIAGNOSIS — X32XXXD Exposure to sunlight, subsequent encounter: Secondary | ICD-10-CM | POA: Diagnosis not present

## 2020-02-18 ENCOUNTER — Telehealth: Payer: Self-pay | Admitting: Family Medicine

## 2020-02-18 NOTE — Telephone Encounter (Signed)
Cassandra from [facility] called to see if Dr. Martinique will be the attending for Hospice.  Vito Backers [phone]

## 2020-02-18 NOTE — Telephone Encounter (Signed)
I left Cassandra a message letting her know Dr. Martinique will be the attending.

## 2020-02-20 NOTE — Telephone Encounter (Signed)
Haley [name] from [facility] [phone] [phone]  The patient refused Hospice and wants Palliative Care.  Haley [name] wants to know if Dr. Martinique will be the attending while the patient is in Palliative Care.

## 2020-02-21 NOTE — Telephone Encounter (Signed)
Referral form has been faxed.

## 2020-02-21 NOTE — Telephone Encounter (Signed)
Verbal given. Referral form filled out & awaiting PCP's signature before it can be faxed.

## 2020-02-24 ENCOUNTER — Other Ambulatory Visit (HOSPITAL_COMMUNITY): Payer: Self-pay | Admitting: Hematology

## 2020-02-24 ENCOUNTER — Ambulatory Visit (HOSPITAL_COMMUNITY): Payer: PPO | Admitting: Hematology

## 2020-02-24 ENCOUNTER — Other Ambulatory Visit (HOSPITAL_COMMUNITY): Payer: PPO

## 2020-02-24 ENCOUNTER — Other Ambulatory Visit: Payer: Self-pay | Admitting: Student

## 2020-02-24 ENCOUNTER — Ambulatory Visit (HOSPITAL_COMMUNITY): Payer: PPO

## 2020-02-24 DIAGNOSIS — R188 Other ascites: Secondary | ICD-10-CM

## 2020-02-24 DIAGNOSIS — C561 Malignant neoplasm of right ovary: Secondary | ICD-10-CM

## 2020-02-25 ENCOUNTER — Other Ambulatory Visit: Payer: Self-pay

## 2020-02-25 ENCOUNTER — Ambulatory Visit (HOSPITAL_COMMUNITY): Payer: PPO

## 2020-02-25 ENCOUNTER — Ambulatory Visit (HOSPITAL_COMMUNITY)
Admission: RE | Admit: 2020-02-25 | Discharge: 2020-02-25 | Disposition: A | Discharge status: Home / Self Care | Payer: PPO | Source: Ambulatory Visit | Attending: Hematology | Admitting: Hematology

## 2020-02-25 ENCOUNTER — Encounter (HOSPITAL_COMMUNITY): Payer: Self-pay

## 2020-02-25 ENCOUNTER — Other Ambulatory Visit (HOSPITAL_COMMUNITY): Payer: Self-pay | Admitting: Hematology

## 2020-02-25 ENCOUNTER — Other Ambulatory Visit (HOSPITAL_COMMUNITY): Payer: Self-pay

## 2020-02-25 DIAGNOSIS — R180 Malignant ascites: Secondary | ICD-10-CM | POA: Diagnosis not present

## 2020-02-25 DIAGNOSIS — Z4902 Encounter for fitting and adjustment of peritoneal dialysis catheter: Secondary | ICD-10-CM | POA: Diagnosis not present

## 2020-02-25 DIAGNOSIS — R188 Other ascites: Secondary | ICD-10-CM

## 2020-02-25 DIAGNOSIS — C561 Malignant neoplasm of right ovary: Secondary | ICD-10-CM | POA: Diagnosis not present

## 2020-02-25 DIAGNOSIS — C569 Malignant neoplasm of unspecified ovary: Secondary | ICD-10-CM | POA: Diagnosis not present

## 2020-02-25 HISTORY — PX: IR PERC PLEURAL DRAIN W/INDWELL CATH W/IMG GUIDE: IMG5383

## 2020-02-25 HISTORY — PX: IR US GUIDANCE: IMG2393

## 2020-02-25 LAB — CBC
HCT: 34.6 % — ABNORMAL LOW (ref 36.0–46.0)
Hemoglobin: 10.9 g/dL — ABNORMAL LOW (ref 12.0–15.0)
MCH: 28.9 pg (ref 26.0–34.0)
MCHC: 31.5 g/dL (ref 30.0–36.0)
MCV: 91.8 fL (ref 80.0–100.0)
Platelets: 250 10*3/uL (ref 150–400)
RBC: 3.77 MIL/uL — ABNORMAL LOW (ref 3.87–5.11)
RDW: 13.5 % (ref 11.5–15.5)
WBC: 8.3 10*3/uL (ref 4.0–10.5)
nRBC: 0 % (ref 0.0–0.2)

## 2020-02-25 LAB — PROTIME-INR
INR: 1 (ref 0.8–1.2)
Prothrombin Time: 12.8 seconds (ref 11.4–15.2)

## 2020-02-25 MED ORDER — LIDOCAINE-EPINEPHRINE 1 %-1:100000 IJ SOLN
INTRAMUSCULAR | Status: AC
  Filled 2020-02-25: qty 1

## 2020-02-25 MED ORDER — FENTANYL CITRATE (PF) 100 MCG/2ML IJ SOLN
INTRAMUSCULAR | Status: AC | PRN
  Administered 2020-02-25: 50 ug via INTRAVENOUS

## 2020-02-25 MED ORDER — HEPARIN SOD (PORK) LOCK FLUSH 100 UNIT/ML IV SOLN
500.0000 [IU] | INTRAVENOUS | Status: AC | PRN
  Administered 2020-02-25: 500 [IU]

## 2020-02-25 MED ORDER — HEPARIN SOD (PORK) LOCK FLUSH 100 UNIT/ML IV SOLN
250.0000 [IU] | INTRAVENOUS | Status: DC | PRN
  Filled 2020-02-25: qty 5

## 2020-02-25 MED ORDER — FENTANYL CITRATE (PF) 100 MCG/2ML IJ SOLN
INTRAMUSCULAR | Status: AC
  Filled 2020-02-25: qty 2

## 2020-02-25 MED ORDER — MIDAZOLAM HCL 2 MG/2ML IJ SOLN
INTRAMUSCULAR | Status: AC
  Filled 2020-02-25: qty 2

## 2020-02-25 MED ORDER — CEFAZOLIN SODIUM-DEXTROSE 2-4 GM/100ML-% IV SOLN
INTRAVENOUS | Status: AC
  Administered 2020-02-25: 2 g via INTRAVENOUS
  Filled 2020-02-25: qty 100

## 2020-02-25 MED ORDER — MIDAZOLAM HCL 2 MG/2ML IJ SOLN
INTRAMUSCULAR | Status: AC | PRN
  Administered 2020-02-25: 1 mg via INTRAVENOUS

## 2020-02-25 MED ORDER — LIDOCAINE-EPINEPHRINE 1 %-1:100000 IJ SOLN
INTRAMUSCULAR | Status: AC | PRN
  Administered 2020-02-25: 10 mL

## 2020-02-25 MED ORDER — SODIUM CHLORIDE 0.9 % IV SOLN: INTRAVENOUS | Status: DC

## 2020-02-25 MED ORDER — CEFAZOLIN SODIUM-DEXTROSE 2-4 GM/100ML-% IV SOLN: 2.0000 g | Freq: Once | INTRAVENOUS | Status: AC

## 2020-02-25 NOTE — H&P (Signed)
Referring Physician(s): Katragadda,Sreedhar  Supervising Physician: Daryll Brod  Patient Status:  [facility]  Chief Complaint: "I'm having a tube put in my belly"   Subjective: Patient familiar to IR service from multiple paracenteses, latest on 02/07/2020 yielding 3 L.  She has also had prior Port-A-Cath placement in 2020.  She has a history of ovarian cancer with prior surgery and chemotherapy diagnosed in 2020.  She now has disease progression and is transitioning to hospice care.  She presented to radiology dept on 2/4 for tunneled peritoneal catheter placement to assist with drainage of recurrent ascites, however there was insufficient ascites to perform procedure. She presents again today for the above procedure.   She currently denies fever, headache, chest pain, dyspnea, cough, back pain, vomiting or bleeding.  She does have some abdominal discomfort/distention/occasional indigestion/nausea.  Additional medical history as below.  Past Medical History:  Diagnosis Date  . Allergy    SEASONAL  . Anxiety   . Cataract    BILATERAL  . Dysrhythmia   . Family history of lung cancer   . Family history of prostate cancer   . Family history of prostate cancer   . Family history of uterine cancer   . Fibromyalgia   . GERD (gastroesophageal reflux disease)   . H/O echocardiogram 04/28/08   EF>55% trace mitral regurgitation, No significant valvular pathology  . Hemorrhoids    internal  . History of stress test 02/08/2011   Normal Myocardial perfusion study, this is a low risk scan, No prior study available for comparison  . Hyperlipidemia   . Hypertension    hx of  . Hypothyroidism   . MVP (mitral valve prolapse)    mild and MR  . OA (osteoarthritis)   . Osteoporosis   . ovarian ca dx'd 12/2017   ovarian cancer  . Paroxysmal A-fib ([place])   . Sleep apnea    does not use prescribed CPAP  does not tolerate  . Thyroid disease    HYPO  . Varicosities of leg   . Vitamin D  deficiency    Past Surgical History:  Procedure Laterality Date  . ABDOMINAL HYSTERECTOMY  1989  . APPENDECTOMY  1989   with hysterectomy  . BILATERAL SALPINGECTOMY N/A 03/27/2018   Procedure: EXPLORATORY LAPARATOMY, BILATERAL SALPINGOOPHERECTOMY;  Surgeon: Everitt Amber, MD;  Location: [facility];  Service: Gynecology;  Laterality: N/A;  . BREAST EXCISIONAL BIOPSY Left 1995   Benign  . COLONOSCOPY  11-20-2000  . DEBULKING N/A 03/27/2018   Procedure: RADICAL TUMOR DEBULKING;  Surgeon: Everitt Amber, MD;  Location: [facility];  Service: Gynecology;  Laterality: N/A;  . IR IMAGING GUIDED PORT INSERTION  01/17/2018  . NOSE SURGERY  1990  . OMENTECTOMY N/A 03/27/2018   Procedure: OMENTECTOMY;  Surgeon: Everitt Amber, MD;  Location: [facility];  Service: Gynecology;  Laterality: N/A;      Allergies: Tramadol hcl  Medications: Prior to Admission medications   Medication Sig Start Date End Date Taking? Authorizing Provider  calcium carbonate (OSCAL) 1500 (600 Ca) MG TABS tablet Take 600 mg of elemental calcium by mouth daily.   Yes [provider]  cholecalciferol (VITAMIN D) 1000 UNITS tablet Take 1,000 Units by mouth daily.   Yes [provider]  DIGESTIVE ENZYMES PO Take 1 capsule by mouth daily.   Yes [provider]  fluticasone (FLONASE) 50 MCG/ACT nasal spray Place 2 sprays into both nostrils daily. 02/20/19  Yes [place], Anneli G, MD  gabapentin (NEURONTIN) 300 MG  capsule Take 1 capsule (300 mg total) by mouth 2 (two) times daily. Patient taking differently: Take 300 mg by mouth 2 (two) times daily. Taking 1 capsule at bedtime 10/29/19  Yes [place], Kaydyn G, MD  HYDROmorphone (DILAUDID) 2 MG tablet Take 1 tablet (2 mg total) by mouth daily as needed for severe pain. 02/02/20  Yes [place], Odean G, MD  lactose free nutrition (BOOST PLUS) LIQD Take 237 mLs by mouth daily. 02/02/18  Yes Kayleen Memos, DO  levothyroxine (SYNTHROID) 75 MCG tablet Take 1 tablet (75 mcg total) by mouth  daily. Patient taking differently: Take 75 mcg by mouth daily. Taking 1 tablet every day except 1/2 tablet on Tues and Thurs 12/13/19  Yes [place], Marella G, MD  losartan (COZAAR) 25 MG tablet Take 1 tablet by mouth once daily 02/07/20  Yes Troy Sine, MD  Magnesium 400 MG TABS Take 250 mg by mouth daily.   Yes [provider]  Menthol-Methyl Salicylate (MUSCLE RUB) 10-15 % CREA Apply 1 application topically as needed for muscle pain.   Yes [provider]  metoprolol succinate (TOPROL-XL) 50 MG 24 hr tablet Take 0.5 tablets (25 mg total) by mouth daily. Take with or immediately following a meal. 01/22/20  Yes Troy Sine, MD  Multiple Vitamins-Minerals (MULTIVITAMIN WITH MINERALS) tablet Take 1 tablet by mouth daily.   Yes [provider]  Omega-3 Fatty Acids (FISH OIL) 1000 MG CAPS Take 2,000 mg by mouth in the morning and at bedtime.   Yes [provider]  polyethylene glycol (MIRALAX / GLYCOLAX) packet Take 17 g by mouth daily. 02/02/18  Yes Kayleen Memos, DO  prochlorperazine (COMPAZINE) 10 MG tablet Take 1 tablet (10 mg total) by mouth every 6 (six) hours as needed. 01/21/20  Yes Derek Jack, MD  senna (SENOKOT) 8.6 MG TABS tablet Take 1 tablet (8.6 mg total) by mouth at bedtime as needed for mild constipation. 04/01/18  Yes Everitt Amber, MD  simvastatin (ZOCOR) 20 MG tablet Take 1 tablet (20 mg total) by mouth at bedtime. 07/30/19  Yes Kilroy, Luke K, PA-C  tretinoin (RETIN-A) 0.025 % cream APPLY TOPICALLY TO FACE AT BEDTIME AS NEEDED 03/28/19  Yes [provider]  vitamin B-12 (CYANOCOBALAMIN) 1000 MCG tablet Take 1,000 mcg by mouth every other day.   Yes [provider]  vitamin C (ASCORBIC ACID) 500 MG tablet Take 1,000 mg by mouth daily.   Yes [provider]  vitamin E 45 MG (100 UNITS) capsule Take by mouth daily.   Yes [provider]  zinc gluconate 50 MG tablet Take 50 mg by mouth daily.   Yes [provider]  ALPRAZolam (XANAX) 0.5 MG tablet TAKE 1/2 TO 1 (ONE-HALF TO ONE) TABLET BY MOUTH ONCE DAILY AS NEEDED FOR ANXIETY 12/04/19   [place], Jaida G, MD  alum & mag hydroxide-simeth (MAALOX/MYLANTA) 200-200-20 MG/5ML suspension Take 30 mLs by mouth every 6 (six) hours as needed for indigestion or heartburn. 04/01/18   Everitt Amber, MD  Artificial Saliva [facility]) LOZG Use as directed 1 lozenge in the mouth or throat every 4 (four) hours as needed. 02/08/19   [place], Martasia G, MD  desonide (DESOWEN) 0.05 % ointment Apply 1 application topically 2 (two) times daily. For up to 2 weeks. 01/22/20   [place], Sammye G, MD  estradiol (ESTRACE) 0.1 MG/GM vaginal cream Place 1 Applicatorful vaginally 3 (three) times a week. 01/30/19   Heath Lark, MD  fluticasone (CUTIVATE) 0.05 %  cream APPLY CREAM TOPICALLY UP TO TWICE DAILY AS NEEDED FOR RASH 03/28/19   [provider]  omeprazole (PRILOSEC) 40 MG capsule Take 1 capsule (40 mg total) by mouth daily. 06/19/19   [place], Caylyn G, MD  XARELTO 20 MG TABS tablet TAKE 1 TABLET BY MOUTH ONCE DAILY WITH SUPPER 08/27/19   Troy Sine, MD     Vital Signs: BP 103/67   Pulse 84   Temp 98.3 F (36.8 C) (Oral)   Resp 12   SpO2 98%   Physical Exam:  awake, alert.  Chest clear to auscultation bilaterally.  Intact right chest wall Port-A-Cath.  Heart with reg rate and rhythm.  Abdomen soft, slightly distended, some mild diffuse tenderness to palpation. Ext with FROM.     Imaging: No results found.  Labs:  CBC: Recent Labs    12/31/19 1204 01/21/20 0950 02/14/20 1304 02/25/20 1257  WBC 6.5 7.8 7.3 8.3  HGB 10.5* 10.9* 11.2* 10.9*  HCT 33.3* 35.0* 35.2* 34.6*  PLT 186 223 230 250    COAGS: Recent Labs    02/14/20 1304 02/25/20 1257  INR 1.0 1.0    BMP: Recent Labs    06/03/19 0918 06/13/19 0820 06/13/19 0820 09/09/19 1016 11/13/19 1033 11/18/19 0857 12/09/19 0837 12/31/19 1204 01/21/20 0950  NA 141 141  --  137 139 136  135 136 133*  K 3.8 4.0  --  4.3 4.8 4.1 3.3* 3.9 3.9  CL 107 108  --  105 102 104 103 103 101  CO2 27 25  --  26 25 26 25 26 25   GLUCOSE 113* 96  --  98 92 92 104* 86 92  BUN 14 11  --  11 13 12 14 15 16   CALCIUM 8.9 9.2  --  8.6* 9.4 9.1 8.8* 8.9 8.9  CREATININE 0.81 0.81   < > 0.71 0.92 0.79 0.86 0.81 0.85  GFRNONAA >60 >60   < > >60 61 >60 >60 >60 >60  GFRAA >60 >60  --  >60 70  --   --   --   --    < > = values in this interval not displayed.    LIVER FUNCTION TESTS: Recent Labs    11/18/19 0857 12/09/19 0837 12/31/19 1204 01/21/20 0950  BILITOT 0.5 0.6 0.5 0.6  AST 22 26 27  42*  ALT 19 19 17 19   ALKPHOS 64 74 67 75  PROT 6.8 7.5 7.3 7.3  ALBUMIN 3.5 3.6 3.5 3.5    Assessment and Plan: Patient familiar to IR service from multiple paracenteses, latest on 02/07/2020 yielding 3 L.  She has also had prior Port-A-Cath placement in 2020.  She has a history of ovarian cancer with prior surgery and chemotherapy diagnosed in 2020.  She now has disease progression and is transitioning to hospice care.  She presented to radiology dept on 2/4 for tunneled peritoneal catheter placement to assist with drainage of recurrent ascites, however there was insufficient ascites to perform procedure. She presents again today for the above procedure.  Risks and benefits discussed with the patient including bleeding, infection, damage to adjacent structures, bowel perforation/fistula connection, and sepsis.   Electronically Signed: D. Rowe Robert, PA-C 02/25/2020, 1:58 PM   I spent a total of 20 minutes at the the patient's bedside AND on the patient's hospital floor or unit, greater than 50% of which was counseling/coordinating care for tunneled peritoneal drain placement

## 2020-02-25 NOTE — Procedures (Signed)
Interventional Radiology Procedure Note  Procedure: RT ABD TUNNELED PERITONEAL DRAIN(ABD PLEURX) WITH LVP    Complications: None  Estimated Blood Loss:  MIN  Findings: FULL REPORT IN PACS    Tamera Punt, MD

## 2020-02-25 NOTE — Progress Notes (Signed)
Order placed for [facility] RN. [facility] unable to accept patient. [facility] unable to accept patient. Referral made to [facility], awaiting response

## 2020-02-25 NOTE — Progress Notes (Signed)
[facility] and [facility] unable to accept patient for home health RN. Referral sent to [facility]. Awaiting response

## 2020-02-25 NOTE — Discharge Instructions (Signed)
Please call Interventional Radiology clinic [phone] with any questions or concerns.  Call Dr. Derek Jack, MD [place] [phone] and home health tomorrow morning for drain care    Ascites Drainage Catheter Home Guide An ascites drainage catheter is a thin, flexible tube that helps drain excess fluid that has collected in the abdomen (ascites). Draining the fluid can help prevent pain and keep other problems from developing. There are several kinds of ascites drainage systems. Follow general guidelines as well as instructions about your specific drainage system. Your health care provider will tell you:  How to use your system.  How much fluid to drain.  How often to drain the fluid. Contact your health care provider if you have any questions or concerns. What are the risks? The ascites drainage catheter system is generally safe. However, problems may occur, including:  Infection.  Bleeding.  A dislodged or blocked catheter. Supplies needed:  Soap and warm water.  A mask.  Alcohol wipes.  Gauze.  Paper tape.  Drainage container.  Alcohol-based cleaner or bleach-based cleaner. How to use the catheter to drain fluid 1. Clean any surface that you will be using. 2. Wash your hands with soap and water for at least 20 seconds before and after you change you drain fluid. If soap and water are not available, use hand sanitizer. 3. Put on a mask. Make sure that it covers your nose and mouth. 4. Place all the supplies you will need on the surfaces that you have cleaned. Make sure that you can easily reach them. 5. Clean the safety valve on the end of the drainage catheter with an alcohol wipe. 6. Attach the adapter or access tip to the safety valve on your catheter. To do this, you may need to remove a protective cap on the safety valve. 7. Release the clamps on your drainage container. 8. Allow the fluid to drain into the container. 9. Close the  clamps and release the catheter from the drainage container. 10. Write down the amount of fluid that was drained, if your health care provider has asked you to do that. 11. Follow instructions from your health care provider about collecting and transporting the fluid. Your health care provider may ask you to collect the fluid and take it to the lab to be analyzed. If you are told to dispose of the fluid, pour it down the toilet or do as told by your health care provider. 12. Clean the surfaces where any fluid was spilled with an alcohol-based or bleach-based cleaner.   What should I do after I drain the fluid? 1. Wipe the end of your catheter with an alcohol wipe. 2. Put the protective cap back onto the safety valve. 3. Follow instructions from your health care provider and: ? Place gauze bandages (dressings) around the catheter area. ? Secure the catheter to your body with gauze and tape. Contact a health care provider if you have:  Skin irritation, redness, or pain where the catheter was inserted (insertion site).  Trouble draining fluid.  Trouble caring for your catheter.  Abdominal pain, bloating, or cramping. Get help right away if:  You develop a fever or chills.  You have increasing redness or increasing pain around the insertion site.  You have blood or pus coming from the insertion site.  You notice that the fluid that drains out of the catheter becomes cloudy or has a bad smell.  You have abdominal pain or cramping that worsens or does  not go away. Summary  An ascites drainage catheter is a thin, flexible tube that helps drain excess fluid that has collected in your abdomen.  There are several kinds of ascites drainage systems. Follow general guidelines as well as your health care provider's instructions about your specific drainage system.  Follow instructions from your health care provider about collecting and transporting the fluid. Your health care provider may ask  you to collect the fluid and take it to the lab to be analyzed. This information is not intended to replace advice given to you by your health care provider. Make sure you discuss any questions you have with your health care provider. Document Revised: 09/10/2019 Document Reviewed: 09/10/2019 Elsevier Patient Education  2021 [place].   Moderate Conscious Sedation, Adult, Care After This sheet gives you information about how to care for yourself after your procedure. Your health care provider may also give you more specific instructions. If you have problems or questions, contact your health care provider. What can I expect after the procedure? After the procedure, it is common to have:  Sleepiness for several hours.  Impaired judgment for several hours.  Difficulty with balance.  Vomiting if you eat too soon. Follow these instructions at home: For the time period you were told by your health care provider:  Rest.  Do not participate in activities where you could fall or become injured.  Do not drive or use machinery.  Do not drink alcohol.  Do not take sleeping pills or medicines that cause drowsiness.  Do not make important decisions or sign legal documents.  Do not take care of children on your own.      Eating and drinking  Follow the diet recommended by your health care provider.  Drink enough fluid to keep your urine pale yellow.  If you vomit: ? Drink water, juice, or soup when you can drink without vomiting. ? Make sure you have little or no nausea before eating solid foods.   General instructions  Take over-the-counter and prescription medicines only as told by your health care provider.  Have a responsible adult stay with you for the time you are told. It is important to have someone help care for you until you are awake and alert.  Do not smoke.  Keep all follow-up visits as told by your health care provider. This is important. Contact a health  care provider if:  You are still sleepy or having trouble with balance after 24 hours.  You feel light-headed.  You keep feeling nauseous or you keep vomiting.  You develop a rash.  You have a fever.  You have redness or swelling around the IV site. Get help right away if:  You have trouble breathing.  You have new-onset confusion at home. Summary  After the procedure, it is common to feel sleepy, have impaired judgment, or feel nauseous if you eat too soon.  Rest after you get home. Know the things you should not do after the procedure.  Follow the diet recommended by your health care provider and drink enough fluid to keep your urine pale yellow.  Get help right away if you have trouble breathing or new-onset confusion at home. This information is not intended to replace advice given to you by your health care provider. Make sure you discuss any questions you have with your health care provider. Document Revised: 04/26/2019 Document Reviewed: 11/22/2018 Elsevier Patient Education  2021 [employer].

## 2020-02-26 ENCOUNTER — Ambulatory Visit (HOSPITAL_COMMUNITY): Payer: PPO

## 2020-02-26 ENCOUNTER — Other Ambulatory Visit (HOSPITAL_COMMUNITY): Payer: Self-pay | Admitting: Hematology

## 2020-02-26 DIAGNOSIS — R188 Other ascites: Secondary | ICD-10-CM

## 2020-02-26 DIAGNOSIS — C561 Malignant neoplasm of right ovary: Secondary | ICD-10-CM

## 2020-02-27 ENCOUNTER — Ambulatory Visit (HOSPITAL_COMMUNITY): Payer: PPO

## 2020-02-28 ENCOUNTER — Other Ambulatory Visit: Payer: Self-pay

## 2020-02-28 ENCOUNTER — Ambulatory Visit (HOSPITAL_COMMUNITY): Payer: PPO

## 2020-02-28 MED ORDER — FLUTICASONE PROPIONATE 50 MCG/ACT NA SUSP: 2.0000 | Freq: Every day | NASAL | 2 refills | Status: AC

## 2020-03-02 ENCOUNTER — Ambulatory Visit: Payer: PPO | Admitting: Family Medicine

## 2020-03-09 ENCOUNTER — Other Ambulatory Visit: Payer: Self-pay

## 2020-03-09 ENCOUNTER — Other Ambulatory Visit (HOSPITAL_COMMUNITY): Payer: Self-pay

## 2020-03-09 ENCOUNTER — Other Ambulatory Visit (HOSPITAL_COMMUNITY): Payer: Self-pay | Admitting: Radiology

## 2020-03-09 ENCOUNTER — Ambulatory Visit (HOSPITAL_COMMUNITY)
Admission: RE | Admit: 2020-03-09 | Discharge: 2020-03-09 | Disposition: A | Discharge status: Home / Self Care | Source: Ambulatory Visit | Attending: Radiology | Admitting: Radiology

## 2020-03-09 DIAGNOSIS — X58XXXA Exposure to other specified factors, initial encounter: Secondary | ICD-10-CM | POA: Insufficient documentation

## 2020-03-09 DIAGNOSIS — S301XXA Contusion of abdominal wall, initial encounter: Secondary | ICD-10-CM | POA: Diagnosis not present

## 2020-03-09 DIAGNOSIS — R188 Other ascites: Secondary | ICD-10-CM

## 2020-03-09 DIAGNOSIS — C561 Malignant neoplasm of right ovary: Secondary | ICD-10-CM

## 2020-03-09 DIAGNOSIS — Z8543 Personal history of malignant neoplasm of ovary: Secondary | ICD-10-CM | POA: Diagnosis not present

## 2020-03-09 DIAGNOSIS — T85691A Other mechanical complication of intraperitoneal dialysis catheter, initial encounter: Secondary | ICD-10-CM | POA: Diagnosis not present

## 2020-03-09 HISTORY — PX: IR RADIOLOGIST EVAL & MGMT: IMG5224

## 2020-04-06 ENCOUNTER — Telehealth: Payer: Self-pay | Admitting: Pharmacist

## 2020-04-06 NOTE — Chronic Care Management (AMB) (Signed)
I left the patient a message about her upcoming appointment on 04/07/2020 @ 1:00 Pm with the clinical pharmacist. She was asked to please have all medication on hand to review with the pharmacist.   Neita Goodnight) Mare Ferrari, [place] Assistant [phone]

## 2020-04-07 ENCOUNTER — Ambulatory Visit: Payer: PPO | Admitting: Pharmacist

## 2020-04-07 DIAGNOSIS — I10 Essential (primary) hypertension: Secondary | ICD-10-CM

## 2020-04-07 DIAGNOSIS — I480 Paroxysmal atrial fibrillation: Secondary | ICD-10-CM

## 2020-04-07 NOTE — Progress Notes (Signed)
Chronic Care Management Pharmacy Note  04/07/2020 Name:  Haley [name] [facility] MRN:  [id number] DOB:  1945/12/05  Subjective: Haley [name] is an 75 y.o. year old female who is a primary patient of Haley [name], Haley So, MD.  The CCM team was consulted for assistance with disease management and care coordination needs.    Engaged with patient by telephone for follow up visit in response to provider referral for pharmacy case management and/or care coordination services.   Consent to Services:  The patient was given information about Chronic Care Management services, agreed to services, and gave verbal consent prior to initiation of services.  Please see initial visit note for detailed documentation.   Patient Care Team: Haley [name], Haley G, MD as PCP - General (Family Medicine) Troy Sine, MD as PCP - Cardiology (Cardiology) Awanda Mink Craige Cotta, RN as Oncology Nurse Navigator (Oncology) Brien Mates, RN as Oncology Nurse Navigator (Oncology) Haley [name], [facility] as Pharmacist (Pharmacist)  Recent office visits: 01/22/20 Haley Martinique, MD: Patient presented for chronic conditions follow up and abdominal pain. No changes made.  12/24/19 Haley Martinique, MD: Patient presented for annual exam. Decreased metoprolol to 25 mg daily due to bradycardia.  11/26/19 Haley Rakes, LPN: Patient presented for medicare annual wellness exam.   Recent consult visits: 02/17/20 Allyn Kenner, MD (dermatology): Unable to access notes.  02/05/20 Shelva Majestic, MD (cardiology): Patient presented for Afib follow up.   01/30/20 Haley Jack, MD (hematology/oncology): Patient presented for ovarian cancer follow up. Plan to begin topotecan.  Hospital visits: None in previous 6 months  Objective:  Lab Results  Component Value Date   CREATININE 0.85 01/21/2020   BUN 16 01/21/2020   GFR 88.48 11/03/2017   GFRNONAA >60 01/21/2020   GFRAA 70 11/13/2019   NA 133 (L) 01/21/2020   K 3.9 01/21/2020   CALCIUM  8.9 01/21/2020   CO2 25 01/21/2020   GLUCOSE 92 01/21/2020    Lab Results  Component Value Date/Time   HGBA1C 6.0 08/06/2012 10:42 AM   GFR 88.48 11/03/2017 10:19 AM   GFR 90.14 03/21/2017 03:21 PM    Last diabetic Eye exam: No results found for: HMDIABEYEEXA  Last diabetic Foot exam: No results found for: HMDIABFOOTEX   Lab Results  Component Value Date   CHOL 186 11/13/2019   HDL 71 11/13/2019   LDLCALC 90 11/13/2019   LDLDIRECT 84.3 12/16/2010   TRIG 147 11/13/2019   CHOLHDL 2.6 11/13/2019    Hepatic Function Latest Ref Rng & Units 01/21/2020 12/31/2019 12/09/2019  Total Protein 6.5 - 8.1 [name]/dL 7.3 7.3 7.5  Albumin 3.5 - 5.0 [name]/dL 3.5 3.5 3.6  AST 15 - 41 U/L 42(H) 27 26  ALT 0 - 44 U/L $Remo'19 17 19  'mSbjA$ Alk Phosphatase 38 - 126 U/L 75 67 74  Total Bilirubin 0.3 - 1.2 mg/dL 0.6 0.5 0.6  Bilirubin, Direct 0.0 - 0.3 mg/dL - - -    Lab Results  Component Value Date/Time   TSH 7.555 (H) 01/21/2020 09:50 AM   TSH 3.311 12/31/2019 12:10 PM   TSH 2.06 11/03/2017 10:19 AM   TSH 0.59 03/21/2017 03:21 PM    CBC Latest Ref Rng & Units 02/25/2020 02/14/2020 01/21/2020  WBC 4.0 - 10.5 K/uL 8.3 7.3 7.8  Hemoglobin 12.0 - 15.0 [name]/dL 10.9(L) 11.2(L) 10.9(L)  Hematocrit 36.0 - 46.0 % 34.6(L) 35.2(L) 35.0(L)  Platelets 150 - 400 K/uL 250 230 223    Lab Results  Component Value Date/Time   VD25OH  35 03/23/2015 09:16 AM   VD25OH 23 (L) 11/27/2014 01:00 PM    Clinical ASCVD: No  The 10-year ASCVD risk score Mikey Bussing DC Jr., et al., 2013) is: 18.3%   Values used to calculate the score:     Age: 39 years     Sex: Female     Is Non-Hispanic African American: No     Diabetic: No     Tobacco smoker: Yes     Systolic Blood Pressure: 93 mmHg     Is BP treated: Yes     HDL Cholesterol: 71 mg/dL     Total Cholesterol: 186 mg/dL    Depression screen [facility] 2/9 11/26/2019 11/13/2018  Decreased Interest 0 0  Down, Depressed, Hopeless 1 0  PHQ - 2 Score 1 0  Some recent data might be hidden       Social History   Tobacco Use  Smoking Status Former Smoker  . Years: 10.00  . Types: Cigars  . Quit date: 10/24/2019  . Years since quitting: 0.4  Smokeless Tobacco Never Used  Tobacco Comment   not daily  [place] cigars 1-2    BP Readings from Last 3 Encounters:  03/09/20 93/68  02/25/20 (!) 98/55  02/14/20 112/60   Pulse Readings from Last 3 Encounters:  03/09/20 65  02/25/20 (!) 54  02/14/20 (!) 59   Wt Readings from Last 3 Encounters:  02/05/20 161 lb (73 kg)  01/30/20 160 lb 9.6 oz (72.8 kg)  01/22/20 157 lb (71.2 kg)   BMI Readings from Last 3 Encounters:  02/05/20 29.45 kg/m  01/30/20 29.37 kg/m  01/22/20 28.72 kg/m    Assessment/Interventions: Review of patient past medical history, allergies, medications, health status, including review of consultants reports, laboratory and other test data, was performed as part of comprehensive evaluation and provision of chronic care management services.   SDOH:  (Social Determinants of Health) assessments and interventions performed: No  SDOH Screenings   Alcohol Screen: Low Risk   . Last Alcohol Screening Score (AUDIT): 0  Depression (PHQ2-9): Low Risk   . PHQ-2 Score: 1  Financial Resource Strain: Low Risk   . Difficulty of Paying Living Expenses: Not hard at all  Food Insecurity: No Food Insecurity  . Worried About Charity fundraiser in the Last Year: Never true  . Ran Out of Food in the Last Year: Never true  Housing: Low Risk   . Last Housing Risk Score: 0  Physical Activity: Inactive  . Days of Exercise per Week: 0 days  . Minutes of Exercise per Session: 0 min  Social Connections: Moderately Isolated  . Frequency of Communication with Friends and Family: More than three times a week  . Frequency of Social Gatherings with Friends and Family: Three times a week  . Attends Religious Services: Never  . Active Member of Clubs or Organizations: No  . Attends Archivist Meetings: Never  .  Marital Status: Married  Stress: Stress Concern Present  . Feeling of Stress : To some extent  Tobacco Use: Medium Risk  . Smoking Tobacco Use: Former Smoker  . Smokeless Tobacco Use: Never Used  Transportation Needs: No Transportation Needs  . Lack of Transportation (Medical): No  . Lack of Transportation (Non-Medical): No    CCM Care Plan  Allergies  Allergen Reactions  . Tramadol Hcl     jittery    Medications Reviewed Today    Reviewed by Alena Bills, RN (Registered Nurse) on 02/25/20 at  1317  Med List Status: <None>  Medication Order Taking? Sig Documenting Provider Last Dose Status Informant  ALPRAZolam (XANAX) 0.5 MG tablet [id number]  TAKE 1/2 TO 1 (ONE-HALF TO ONE) TABLET BY MOUTH ONCE DAILY AS NEEDED FOR ANXIETY Haley [name], Jonetta G, MD  Active   alum & mag hydroxide-simeth (MAALOX/MYLANTA) 200-200-20 MG/5ML suspension [id number]  Take 30 mLs by mouth every 6 (six) hours as needed for indigestion or heartburn. Everitt Amber, MD  Active   Artificial Saliva [facility]) Doreene Eland [id number]  Use as directed 1 lozenge in the mouth or throat every 4 (four) hours as needed. Haley [name], Halie G, MD  Active   calcium carbonate (OSCAL) 1500 (600 Ca) MG TABS tablet [id number] Yes Take 600 mg of elemental calcium by mouth daily. [provider] Past Week Unknown time Active Self  cholecalciferol (VITAMIN D) 1000 UNITS tablet [id number] Yes Take 1,000 Units by mouth daily. [provider] Past Week Unknown time Active Self  desonide (DESOWEN) 0.05 % ointment [id number]  Apply 1 application topically 2 (two) times daily. For up to 2 weeks. Haley [name], Illona G, MD  Active            Med Note Roena Malady Feb 05, 2020  5:32 PM) Patient has not picked up yet  DIGESTIVE ENZYMES PO [id number] Yes Take 1 capsule by mouth daily. [provider] Past Week Unknown time Active   estradiol (ESTRACE) 0.1 MG/GM vaginal cream [id number] No Place 1 Applicatorful vaginally 3 (three) times a  week. Heath Lark, MD More than a month Unknown time Active   fluticasone (CUTIVATE) 0.05 % cream [id number]  APPLY CREAM TOPICALLY UP TO TWICE DAILY AS NEEDED FOR RASH [provider]  Active            Med Note Wynelle Link, Drucilla Schmidt   Fri Feb 14, 2020 12:52 PM) Pt states is currently out of prescription   fluticasone (FLONASE) 50 MCG/ACT nasal spray [id number] Yes Place 2 sprays into both nostrils daily. Haley [name], Aubriee G, MD Past Month Unknown time Active   gabapentin (NEURONTIN) 300 MG capsule [id number] Yes Take 1 capsule (300 mg total) by mouth 2 (two) times daily.  Patient taking differently: Take 300 mg by mouth 2 (two) times daily. Taking 1 capsule at bedtime   Haley [name], Shauntay G, MD 02/24/2020 Unknown time Active   HYDROmorphone (DILAUDID) 2 MG tablet [id number] Yes Take 1 tablet (2 mg total) by mouth daily as needed for severe pain. Haley [name], Kiyoko G, MD Past Week Unknown time Active   lactose free nutrition (BOOST PLUS) LIQD [id number] Yes Take 237 mLs by mouth daily. Kayleen Memos, DO Past Week Unknown time Active Self  levothyroxine (SYNTHROID) 75 MCG tablet [id number] Yes Take 1 tablet (75 mcg total) by mouth daily.  Patient taking differently: Take 75 mcg by mouth daily. Taking 1 tablet every day except 1/2 tablet on Tues and Thurs   Haley [name], Jaionna G, MD 02/24/2020 Unknown time Active   losartan (COZAAR) 25 MG tablet [id number] Yes Take 1 tablet by mouth once daily Troy Sine, MD 02/25/2020 1100 Active   Magnesium 400 MG TABS [id number] Yes Take 250 mg by mouth daily. [provider] Past Week Unknown time Active Self  Menthol-Methyl Salicylate (MUSCLE RUB) 10-15 % CREA [id number] Yes Apply 1 application topically as needed for muscle pain. [provider] Past Week Unknown time Active   metoprolol succinate (TOPROL-XL) 50 MG 24 hr tablet [id number] Yes Take 0.5 tablets (25 mg  total) by mouth daily. Take with or immediately following a meal. Troy Sine, MD 02/25/2020 1100  Active   Multiple Vitamins-Minerals (MULTIVITAMIN WITH MINERALS) tablet [id number] Yes Take 1 tablet by mouth daily. [provider] Past Week Unknown time Active Self  Omega-3 Fatty Acids (FISH OIL) 1000 MG CAPS [id number] Yes Take 2,000 mg by mouth in the morning and at bedtime. [provider] Past Week Unknown time Active Self           Med Note Jocelyn Lamer Dec 09, 2019  9:15 AM)    omeprazole (PRILOSEC) 40 MG capsule [id number]  Take 1 capsule (40 mg total) by mouth daily. Haley [name], Merica G, MD  Active   polyethylene glycol [facility] / GLYCOLAX) packet [id number] Yes Take 17 [name] by mouth daily. Kayleen Memos, DO Past Week Unknown time Active Self  prochlorperazine (COMPAZINE) 10 MG tablet [id number] Yes Take 1 tablet (10 mg total) by mouth every 6 (six) hours as needed. Haley Jack, MD Past Week Unknown time Active   senna (SENOKOT) 8.6 MG TABS tablet [id number] Yes Take 1 tablet (8.6 mg total) by mouth at bedtime as needed for mild constipation. Everitt Amber, MD Past Week Unknown time Active   simvastatin (ZOCOR) 20 MG tablet [id number] Yes Take 1 tablet (20 mg total) by mouth at bedtime. Erlene Quan, PA-C Past Week Unknown time Active   tretinoin (RETIN-A) 0.025 % cream [id number] Yes APPLY TOPICALLY TO FACE AT BEDTIME AS NEEDED [provider] Past Week Unknown time Active            Med Note Wynelle Link, TONYA W   Fri Feb 14, 2020 12:55 PM) Pt states is currently out of prescription   vitamin B-12 (CYANOCOBALAMIN) 1000 MCG tablet [id number] Yes Take 1,000 mcg by mouth every other day. [provider] Past Week Unknown time Active   vitamin C (ASCORBIC ACID) 500 MG tablet [id number] Yes Take 1,000 mg by mouth daily. [provider] Past Week Unknown time Active Self  vitamin E 45 MG (100 UNITS) capsule [id number] Yes Take by mouth daily. [provider] Past Week Unknown time Active   XARELTO 20 MG TABS tablet [id number] No TAKE 1  TABLET BY MOUTH ONCE DAILY WITH SUPPER Troy Sine, MD 02/23/2020 Active   zinc gluconate 50 MG tablet [id number] Yes Take 50 mg by mouth daily. [provider] Past Week Unknown time Active           Patient Active Problem List   Diagnosis Date Noted  . Aortic atherosclerosis ([place]) 01/22/2020  . 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 End of life care 06/25/2019  . Skin lesion of right lower extremity 06/03/2019  . Lymphadenopathy, axillary 03/26/2019  . Atrophic vaginitis 01/28/2019  . B12 deficiency 12/13/2018  . Mucositis due to antineoplastic therapy 12/03/2018  . Pain, dental 11/20/2018  . Rectal pain 09/10/2018  . Encounter for antineoplastic chemotherapy 07/05/2018  . Goals of care, counseling/discussion 07/03/2018  . Genetic testing 05/08/2018  . Peripheral neuropathy due to chemotherapy ([place]) 04/20/2018  . Pre-operative cardiovascular examination 03/08/2018  . Chest pain with moderate risk of acute coronary syndrome 03/08/2018  . Family history of prostate cancer   . Family history of uterine cancer   . Family history of lung cancer   . Chronic anticoagulation 02/19/2018  . Lower extremity pain 02/05/2018  . Encounter for imaging study to confirm nasogastric (NG) tube placement   .  Right ovarian epithelial cancer ([place]) 01/16/2018  . Partial small bowel obstruction ([place]) 01/16/2018  . Chronic abdominal pain 01/15/2018  . Carcinomatosis ([place]) 01/11/2018  . Essential hypertension, benign 09/13/2017  . Bilateral impacted cerumen 08/16/2017  . OSA (obstructive sleep apnea) 08/16/2017  . Seasonal allergic rhinitis 08/16/2017  . Osteoporosis 01/13/2014  . Urgency incontinence 01/13/2014  . PAF (paroxysmal atrial fibrillation) ([place]) 03/31/2013  . Tobacco use disorder 08/06/2012  . Dyslipidemia 06/05/2012  . Anxiety 06/05/2012  . GERD 06/03/2008  . Hypothyroidism 07/07/2006    Immunization History  Administered Date(s)  Administered  . PFIZER(Purple Top)SARS-COV-2 Vaccination 06/15/2019, 07/08/2019  . Pneumococcal Conjugate-13 11/03/2017  . Pneumococcal Polysaccharide-23 09/10/2009  . Td 10/03/2001  . Zoster 08/13/2013   Having fluid drawn out every day and is having some irritation around the area; someone is coming out every day   Conditions to be addressed/monitored:  Hypertension, Hyperlipidemia, Atrial Fibrillation, GERD, Hypothyroidism, Anxiety, Osteoporosis and pain and constipation  There are no care plans that you recently modified to display for this patient.   Medication Assistance: None required.  Patient affirms current coverage meets needs.  Patient's preferred pharmacy is:  [facility] ([place]) [place], [place] [address] Phone: [phone] Fax: [phone]  [facility]) - [place], [address] [place] [address] Phone: [phone] Fax: [place] [address], [place] - [place] [address] [place] [address] [place] [address] Phone: [phone] Fax: [phone]  Uses pill box? Yes Pt endorses 100% compliance  We discussed: Current pharmacy is preferred with insurance plan and patient is satisfied with pharmacy services Patient decided to: Continue current medication management strategy  Care Plan and Follow Up Patient Decision:  Patient requests no follow-up at this time.  Plan: No further follow up required: patient is enrolled in hospice  Jeni Salles, PharmD [place] Pharmacist [place] at [place] [phone]

## 2020-04-13 ENCOUNTER — Encounter: Payer: Self-pay | Admitting: Family Medicine

## 2020-04-13 ENCOUNTER — Other Ambulatory Visit: Payer: Self-pay

## 2020-04-13 ENCOUNTER — Ambulatory Visit (INDEPENDENT_AMBULATORY_CARE_PROVIDER_SITE_OTHER): Payer: Medicare Other | Admitting: Family Medicine

## 2020-04-13 VITALS — BP 128/80 | HR 95 | Resp 16 | Ht 62.0 in | Wt 169.0 lb

## 2020-04-13 DIAGNOSIS — Z7189 Other specified counseling: Secondary | ICD-10-CM

## 2020-04-13 DIAGNOSIS — R180 Malignant ascites: Secondary | ICD-10-CM

## 2020-04-13 DIAGNOSIS — R600 Localized edema: Secondary | ICD-10-CM

## 2020-04-13 DIAGNOSIS — R110 Nausea: Secondary | ICD-10-CM

## 2020-04-13 DIAGNOSIS — I10 Essential (primary) hypertension: Secondary | ICD-10-CM

## 2020-04-13 DIAGNOSIS — Z515 Encounter for palliative care: Secondary | ICD-10-CM

## 2020-04-13 MED ORDER — SCOPOLAMINE 1 MG/3DAYS TD PT72
1.0000 | MEDICATED_PATCH | TRANSDERMAL | 1 refills | Status: AC
Start: 2020-04-13 — End: ?

## 2020-04-13 MED ORDER — SPIRONOLACTONE 25 MG PO TABS: 25.0000 mg | ORAL_TABLET | Freq: Every day | ORAL | 3 refills | Status: AC

## 2020-04-13 NOTE — Progress Notes (Signed)
Chief Complaint  Patient presents with  . Pain   HPI:  Haley [name] is a 75 y.o. female with hx of right ovarian cancer with metastasis in hospice,HTN,HLD,and anxiety here today with Haley [name] husband to address some concerns.  Haley [name] was last seen on 01/22/20. Haley [name] is c/o "bad" abdominal pressure, mainly upper abdomen, constant. It is exacerbated by food/fluid intake. Haley [name] thinks problem is caused by gas and would like prescription medication to help with it.  Problem interferes with sleep, Haley [name] cannot find a position to feel comfortable.  Haley [name] is having bowel movements q 1-2 days. Haley [name] is on Miralax and Senakot daily prn.  Haley [name] has tried OTC Beano,up to 9 tabs daily but it does not help. Ascitics, s/p right abdominal tunneled peritoneal cath. Haley [name] does drain ascitics as needed, about q 2-3 days, which helps "little" with abdominal pressure. Haley [name] is on Hydromorphone 2 mg daily, which seems to help with abdominal pressure for about 4 hours. Haley [name] does not take medication daily because afraid of getting use to medication.  Frustrated about abdominal distention and edema. Haley [name] was prescribed Furosemide 20 mg and dose was increased recently but Haley [name] stopped mediation after a couple days because severe RLE after taking medication, "bone pain." States that pain resolved after stopping medication. Haley [name] has not noted LE erythema or skin lesions.  Chemo-induced neuropathy: Haley [name] is on Gabapentin 300 mg daily.  Anxiety: Haley [name] is on Alprazolam 0.5 mg daily, tries not to take medication daily.  Nausea: Aggravated by food intake. It happens 3-4 times per day. No vomiting. Haley [name] takes Compazine 10 mg qid.   HTN and atrial fib: Haley [name] is on Metoprolol succinate 50 mg 1/2 tab and Losartan 25 mg daily. Haley [name] is on Xorelto 20 mg daily. Following with cardiologist.  HLD: On Simvastatin 20 mg daily. Haley [name] takes several OTC supplements and vitamins.  Review of Systems  Constitutional: Positive for activity change,  appetite change and fatigue. Negative for fever.  HENT: Negative for mouth sores and sore throat.   Respiratory: Negative for cough, shortness of breath and wheezing.   Cardiovascular: Positive for leg swelling. Negative for chest pain and palpitations.  Endocrine: Negative for cold intolerance and heat intolerance.  Musculoskeletal: Positive for back pain. Negative for gait problem.  Neurological: Negative for syncope and headaches.  Psychiatric/Behavioral: Positive for sleep disturbance. Negative for confusion. The patient is nervous/anxious.   Rest of ROS, see pertinent positives sand negatives in HPI  Current Outpatient Medications on File Prior to Visit  Medication Sig Dispense Refill  . ALPRAZolam (XANAX) 0.5 MG tablet TAKE 1/2 TO 1 (ONE-HALF TO ONE) TABLET BY MOUTH ONCE DAILY AS NEEDED FOR ANXIETY 30 tablet 3  . alum & mag hydroxide-simeth (MAALOX/MYLANTA) 200-200-20 MG/5ML suspension Take 30 mLs by mouth every 6 (six) hours as needed for indigestion or heartburn. 355 mL 0  . Artificial Saliva (SALIVASURE) LOZG Use as directed 1 lozenge in the mouth or throat every 4 (four) hours as needed. 90 lozenge 0  . calcium carbonate (OSCAL) 1500 (600 Ca) MG TABS tablet Take 600 mg of elemental calcium by mouth daily.    . cholecalciferol (VITAMIN D) 1000 UNITS tablet Take 1,000 Units by mouth daily.    Marland Kitchen desonide (DESOWEN) 0.05 % ointment Apply 1 application topically 2 (two) times daily. For up to 2 weeks. 15 g 1  . DIGESTIVE ENZYMES PO Take 1 capsule by mouth daily.    Marland Kitchen estradiol (ESTRACE) 0.1 MG/GM vaginal cream Place 1 Applicatorful  vaginally 3 (three) times a week. 42.5 g 12  . fluticasone (CUTIVATE) 0.05 % cream APPLY CREAM TOPICALLY UP TO TWICE DAILY AS NEEDED FOR RASH    . fluticasone (FLONASE) 50 MCG/ACT nasal spray Place 2 sprays into both nostrils daily. 48 g 2  . gabapentin (NEURONTIN) 300 MG capsule Take 1 capsule (300 mg total) by mouth 2 (two) times daily. (Patient taking  differently: Take 300 mg by mouth 2 (two) times daily. Taking 1 capsule at bedtime) 60 capsule 3  . HYDROmorphone (DILAUDID) 2 MG tablet Take 1 tablet (2 mg total) by mouth daily as needed for severe pain. 30 tablet 0  . lactose free nutrition (BOOST PLUS) LIQD Take 237 mLs by mouth daily. 10 Can 0  . levothyroxine (SYNTHROID) 75 MCG tablet Take 1 tablet (75 mcg total) by mouth daily. (Patient taking differently: Take 75 mcg by mouth daily. Taking 1 tablet every day except 1/2 tablet on Tues and Thurs) 90 tablet 3  . Magnesium 400 MG TABS Take 250 mg by mouth daily.    . Menthol-Methyl Salicylate (MUSCLE RUB) 10-15 % CREA Apply 1 application topically as needed for muscle pain.    . metoprolol succinate (TOPROL-XL) 50 MG 24 hr tablet Take 0.5 tablets (25 mg total) by mouth daily. Take with or immediately following a meal. 90 tablet 0  . Multiple Vitamins-Minerals (MULTIVITAMIN WITH MINERALS) tablet Take 1 tablet by mouth daily.    . Omega-3 Fatty Acids (FISH OIL) 1000 MG CAPS Take 2,000 mg by mouth in the morning and at bedtime.    Marland Kitchen omeprazole (PRILOSEC) 40 MG capsule Take 1 capsule (40 mg total) by mouth daily. 90 capsule 3  . polyethylene glycol (MIRALAX / GLYCOLAX) packet Take 17 g by mouth daily. 14 each 0  . prochlorperazine (COMPAZINE) 10 MG tablet Take 1 tablet (10 mg total) by mouth every 6 (six) hours as needed. 30 tablet 3  . senna (SENOKOT) 8.6 MG TABS tablet Take 1 tablet (8.6 mg total) by mouth at bedtime as needed for mild constipation. 120 each 0  . tretinoin (RETIN-A) 0.025 % cream APPLY TOPICALLY TO FACE AT BEDTIME AS NEEDED    . vitamin B-12 (CYANOCOBALAMIN) 1000 MCG tablet Take 1,000 mcg by mouth every other day.    . vitamin C (ASCORBIC ACID) 500 MG tablet Take 1,000 mg by mouth daily.    . vitamin E 45 MG (100 UNITS) capsule Take by mouth daily.    [name] 20 MG TABS tablet TAKE 1 TABLET BY MOUTH ONCE DAILY WITH SUPPER 90 tablet 2  . zinc gluconate 50 MG tablet Take 50 mg by  mouth daily.     No current facility-administered medications on file prior to visit.   Past Medical History:  Diagnosis Date  . Allergy    SEASONAL  . Anxiety   . Cataract    BILATERAL  . Dysrhythmia   . Family history of lung cancer   . Family history of prostate cancer   . Family history of prostate cancer   . Family history of uterine cancer   . Fibromyalgia   . GERD (gastroesophageal reflux disease)   . H/O echocardiogram 04/28/08   EF>55% trace mitral regurgitation, No significant valvular pathology  . Hemorrhoids    internal  . History of stress test 02/08/2011   Normal Myocardial perfusion study, this is a low risk scan, No prior study available for comparison  . Hyperlipidemia   . Hypertension    hx  of  . Hypothyroidism   . MVP (mitral valve prolapse)    mild and MR  . OA (osteoarthritis)   . Osteoporosis   . ovarian ca dx'd 12/2017   ovarian cancer  . Paroxysmal A-fib ([place])   . Sleep apnea    does not use prescribed CPAP  does not tolerate  . Thyroid disease    HYPO  . Varicosities of leg   . Vitamin D deficiency    Allergies  Allergen Reactions  . Tramadol Hcl     jittery    Social History   Socioeconomic History  . Marital status: Married    Spouse name: Not on file  . Number of children: 3  . Years of education: Not on file  . Highest education level: Not on file  Occupational History  . Occupation: retired  Tobacco Use  . Smoking status: Former Smoker    Years: 10.00    Types: Cigars    Quit date: 10/24/2019    Years since quitting: 0.4  . Smokeless tobacco: Never Used  . Tobacco comment: not daily  [place] cigars 1-2   Vaping Use  . Vaping Use: Never used  Substance and Sexual Activity  . Alcohol use: No    Alcohol/week: 0.0 standard drinks  . Drug use: No  . Sexual activity: Not Currently    Comment: 1st intercourse 17yo-1 partner  Other Topics Concern  . Not on file  Social History Narrative  . Not on file   Social  Determinants of Health   Financial Resource Strain: Low Risk   . Difficulty of Paying Living Expenses: Not hard at all  Food Insecurity: No Food Insecurity  . Worried About Charity fundraiser in the Last Year: Never true  . Ran Out of Food in the Last Year: Never true  Transportation Needs: No Transportation Needs  . Lack of Transportation (Medical): No  . Lack of Transportation (Non-Medical): No  Physical Activity: Inactive  . Days of Exercise per Week: 0 days  . Minutes of Exercise per Session: 0 min  Stress: Stress Concern Present  . Feeling of Stress : To some extent  Social Connections: Moderately Isolated  . Frequency of Communication with Friends and Family: More than three times a week  . Frequency of Social Gatherings with Friends and Family: Three times a week  . Attends Religious Services: Never  . Active Member of Clubs or Organizations: No  . Attends Archivist Meetings: Never  . Marital Status: Married   Vitals:   04/13/20 1055  BP: 128/80  Pulse: 95  SpO2: 90%   Body mass index is 30.91 kg/m.  Physical Exam Vitals and nursing note reviewed.  Constitutional:      General: Haley [name] is not in acute distress.    Appearance: Haley [name] is well-developed.  HENT:     Head: Normocephalic and atraumatic.  Eyes:     Conjunctiva/sclera: Conjunctivae normal.  Cardiovascular:     Rate and Rhythm: Normal rate and regular rhythm.     Heart sounds: No murmur heard.   Pulmonary:     Effort: Pulmonary effort is normal. No respiratory distress.     Breath sounds: Normal breath sounds.  Abdominal:     General: There is distension (Ascitics.).     Palpations: Abdomen is soft.     Tenderness: There is no abdominal tenderness.  Musculoskeletal:     Right lower leg: 4+ Edema present.     Left lower leg: 4+  Edema present.  Skin:    General: Skin is warm.     Findings: No erythema or rash.  Neurological:     Mental Status: Haley [name] is alert and oriented to person, place,  and time.     Cranial Nerves: No cranial nerve deficit.     Gait: Gait normal.  Psychiatric:        Mood and Affect: Mood is anxious.   ASSESSMENT AND PLAN:  Ms. Suni Jarnagin [facility] was seen today for  follow-up.  Diagnoses and all orders for this visit:  Goals of care, counseling/discussion Currently Haley [name] is in hospice. We discussed goals of care, comfort and pain free if possible. Haley [name] has been reluctant to increase dose of Hydromorphone, which seems to help with abdominal pressure.  Haley [name] agrees with trying Hydromorphone 2 mg qid. In regard to Alprazolam , Haley [name] can take it daily at bedtime. We discussed some side effects of meds and risk for interaction.  Haley [name] agrees with stopping Simvastatin. Refused to stop taking some of Haley [name] supplements and vit (vit E).  Will fax note to hospice.  Essential hypertension, benign BP adequately controlled. Because Spironolactone 25 mg added today, Losartan discontinued. Continue Metoprolol succinate 25 mg daily. Monitor BP at home. We discussed some side effects of medications.  Nausea without vomiting Daily Hydromorphone may aggravate problem. Haley [name] agrees with trying scopolamine patch. Continue Compazine 10 mg qid prn.  -     scopolamine (TRANSDERM-SCOP) 1 MG/3DAYS; Place 1 patch (1.5 mg total) onto the skin every 3 (three) days.  Malignant ascites We discussed physiopathology. Continue paracentesis as needed. Explained that problem may get worse.  Bilateral lower extremity edema Haley [name] does not want to try Furosemide again. Spironolactone may help. LE elevation and appropriate skin care recommended.  -     spironolactone (ALDACTONE) 25 MG tablet; Take 1 tablet (25 mg total) by mouth daily.  Spent 45 minutes with pt.  During this time history was obtained and documented, examination was performed,and assessment/plan discussed.  Return if symptoms worsen or fail to improve.  Karson G. Martinique, MD  [facility]. [place]  office.   A few things to remember from today's visit:  Goals of care, counseling/discussion  Essential hypertension, benign  Nausea without vomiting - Plan: scopolamine (TRANSDERM-SCOP) 1 MG/3DAYS  Hospice care patient  Malignant ascites - Plan: spironolactone (ALDACTONE) 25 MG tablet  Bilateral lower extremity edema - Plan: spironolactone (ALDACTONE) 25 MG tablet  If you need refills please call your pharmacy. Do not use My Chart to request refills or for acute issues that need immediate attention.   Mylanta 2 times daily may help with gas. Hydromorphone 2 mg increased to 4 times daily. Xanax at bedtime. Monitor for side effects. Will contact hospice to coordinate care.  Scopolamine patch every 3 days.  You can stop some of your supplements, vit E and fish oil. Stop Simvastatin. Hold on Losartan for now and start Spironolactone. Monitor blood pressure at home and swelling.   Please be sure medication list is accurate. If a new problem present, please set up appointment sooner than planned today.

## 2020-04-13 NOTE — Patient Instructions (Addendum)
A few things to remember from today's visit:  Goals of care, counseling/discussion  Essential hypertension, benign  Nausea without vomiting - Plan: scopolamine (TRANSDERM-SCOP) 1 MG/3DAYS  Hospice care patient  Malignant ascites - Plan: spironolactone (ALDACTONE) 25 MG tablet  Bilateral lower extremity edema - Plan: spironolactone (ALDACTONE) 25 MG tablet  If you need refills please call your pharmacy. Do not use My Chart to request refills or for acute issues that need immediate attention.   Mylanta 2 times daily may help with gas. Hydromorphone 2 mg increased to 4 times daily. Xanax at bedtime. Monitor for side effects. Will contact hospice to coordinate care.  Scopolamine patch every 3 days.  You can stop some of your supplements, vit E and fish oil. Stop Simvastatin. Hold on Losartan for now and start Spironolactone. Monitor blood pressure at home and swelling.   Please be sure medication list is accurate. If a new problem present, please set up appointment sooner than planned today.

## 2020-05-10 DEATH — deceased

## 2020-05-20 ENCOUNTER — Telehealth: Payer: Self-pay | Admitting: Pharmacist

## 2020-05-20 NOTE — Chronic Care Management (AMB) (Addendum)
Chronic Care Management Pharmacy Assistant   Name: Sala Tague [facility]  MRN: [id number] DOB: 1945-08-01  Reason for Encounter: Disease State/ General Assessment Call.    Conditions to be addressed/monitored: HTN and HLD  Patient is currently in Hospice due to Metastasis of Ovarian Cancer.  Recent office visits:  04/13/20 Anyah Martinique MD (PCP) - presented to clinic for goals of care counseling/discussion and other chronic conditions. Patient started on Scopolamine 1.5mg  transdermal every 72 hours, Spironolactone 25mg  daily. Discontinued losartan 25mg  and simvastatin 20mg . Return if symptoms worsen or fail to improve. Note will be sent to Hospice.    Recent consult visits:  None.   Hospital visits:  None in previous 6 months  Medications: Outpatient Encounter Medications as of 05/20/2020  Medication Sig Note   ALPRAZolam (XANAX) 0.5 MG tablet TAKE 1/2 TO 1 (ONE-HALF TO ONE) TABLET BY MOUTH ONCE DAILY AS NEEDED FOR ANXIETY    alum & mag hydroxide-simeth (MAALOX/MYLANTA) 200-200-20 MG/5ML suspension Take 30 mLs by mouth every 6 (six) hours as needed for indigestion or heartburn.    Artificial Saliva (SALIVASURE) LOZG Use as directed 1 lozenge in the mouth or throat every 4 (four) hours as needed.    calcium carbonate (OSCAL) 1500 (600 Ca) MG TABS tablet Take 600 mg of elemental calcium by mouth daily.    cholecalciferol (VITAMIN D) 1000 UNITS tablet Take 1,000 Units by mouth daily.    desonide (DESOWEN) 0.05 % ointment Apply 1 application topically 2 (two) times daily. For up to 2 weeks. 02/05/2020: Patient has not picked up yet   DIGESTIVE ENZYMES PO Take 1 capsule by mouth daily.    estradiol (ESTRACE) 0.1 MG/GM vaginal cream Place 1 Applicatorful vaginally 3 (three) times a week.    fluticasone (CUTIVATE) 0.05 % cream APPLY CREAM TOPICALLY UP TO TWICE DAILY AS NEEDED FOR RASH 02/14/2020: Pt states is currently out of prescription    fluticasone (FLONASE) 50 MCG/ACT nasal spray Place 2  sprays into both nostrils daily.    gabapentin (NEURONTIN) 300 MG capsule Take 1 capsule (300 mg total) by mouth 2 (two) times daily. (Patient taking differently: Take 300 mg by mouth 2 (two) times daily. Taking 1 capsule at bedtime)    HYDROmorphone (DILAUDID) 2 MG tablet Take 1 tablet (2 mg total) by mouth daily as needed for severe pain.    lactose free nutrition (BOOST PLUS) LIQD Take 237 mLs by mouth daily.    levothyroxine (SYNTHROID) 75 MCG tablet Take 1 tablet (75 mcg total) by mouth daily. (Patient taking differently: Take 75 mcg by mouth daily. Taking 1 tablet every day except 1/2 tablet on Tues and Thurs)    Magnesium 400 MG TABS Take 250 mg by mouth daily.    Menthol-Methyl Salicylate (MUSCLE RUB) 10-15 % CREA Apply 1 application topically as needed for muscle pain.    metoprolol succinate (TOPROL-XL) 50 MG 24 hr tablet Take 0.5 tablets (25 mg total) by mouth daily. Take with or immediately following a meal.    Multiple Vitamins-Minerals (MULTIVITAMIN WITH MINERALS) tablet Take 1 tablet by mouth daily.    Omega-3 Fatty Acids (FISH OIL) 1000 MG CAPS Take 2,000 mg by mouth in the morning and at bedtime.    omeprazole (PRILOSEC) 40 MG capsule Take 1 capsule (40 mg total) by mouth daily.    polyethylene glycol (MIRALAX / GLYCOLAX) packet Take 17 g by mouth daily.    prochlorperazine (COMPAZINE) 10 MG tablet Take 1 tablet (10 mg total) by mouth every  6 (six) hours as needed.    scopolamine (TRANSDERM-SCOP) 1 MG/3DAYS Place 1 patch (1.5 mg total) onto the skin every 3 (three) days.    senna (SENOKOT) 8.6 MG TABS tablet Take 1 tablet (8.6 mg total) by mouth at bedtime as needed for mild constipation.    spironolactone (ALDACTONE) 25 MG tablet Take 1 tablet (25 mg total) by mouth daily.    tretinoin (RETIN-A) 0.025 % cream APPLY TOPICALLY TO FACE AT BEDTIME AS NEEDED 02/14/2020: Pt states is currently out of prescription    vitamin B-12 (CYANOCOBALAMIN) 1000 MCG tablet Take 1,000 mcg by mouth  every other day.    vitamin C (ASCORBIC ACID) 500 MG tablet Take 1,000 mg by mouth daily.    vitamin E 45 MG (100 UNITS) capsule Take by mouth daily.    XARELTO 20 MG TABS tablet TAKE 1 TABLET BY MOUTH ONCE DAILY WITH SUPPER    zinc gluconate 50 MG tablet Take 50 mg by mouth daily.    No facility-administered encounter medications on file as of 05/20/2020.    Reviewed chart prior to disease state call. Spoke with patient regarding BP  Recent Office Vitals: BP Readings from Last 3 Encounters:  04/13/20 128/80  03/09/20 93/68  02/25/20 (!) 98/55   Pulse Readings from Last 3 Encounters:  04/13/20 95  03/09/20 65  02/25/20 (!) 54    Wt Readings from Last 3 Encounters:  04/13/20 169 lb (76.7 kg)  02/05/20 161 lb (73 kg)  01/30/20 160 lb 9.6 oz (72.8 kg)     Kidney Function Lab Results  Component Value Date/Time   CREATININE 0.85 01/21/2020 09:50 AM   CREATININE 0.81 12/31/2019 12:04 PM   CREATININE 0.81 06/13/2019 08:20 AM   CREATININE 0.81 06/03/2019 09:18 AM   CREATININE 0.76 11/17/2015 10:05 AM   CREATININE 0.66 04/25/2014 08:54 AM   GFR 88.48 11/03/2017 10:19 AM   GFRNONAA >60 01/21/2020 09:50 AM   GFRNONAA >60 06/13/2019 08:20 AM   GFRAA 70 11/13/2019 10:33 AM   GFRAA >60 06/13/2019 08:20 AM    BMP Latest Ref Rng & Units 01/21/2020 12/31/2019 12/09/2019  Glucose 70 - 99 mg/dL 92 86 104(H)  BUN 8 - 23 mg/dL 16 15 14   Creatinine 0.44 - 1.00 mg/dL 0.85 0.81 0.86  BUN/Creat Ratio 12 - 28 - - -  Sodium 135 - 145 mmol/L 133(L) 136 135  Potassium 3.5 - 5.1 mmol/L 3.9 3.9 3.3(L)  Chloride 98 - 111 mmol/L 101 103 103  CO2 22 - 32 mmol/L 25 26 25   Calcium 8.9 - 10.3 mg/dL 8.9 8.9 8.8(L)    Current antihypertensive regimen:  Spironolactone 25mg  - take daily. Metoprolol succinate 25mg  - take 0.5 tablet daily.   How often are you checking your Blood Pressure?   Current home BP readings:   What recent interventions/DTPs have been made by any provider to improve Blood  Pressure control since last CPP Visit: losartan discontinued by PCP due to hospice care.   Any recent hospitalizations or ED visits since last visit with CPP? No  What diet changes have been made to improve Blood Pressure Control?    What exercise is being done to improve your Blood Pressure Control?    Adherence Review: Is the patient currently on ACE/ARB medication? Does the patient have >5 day gap between last estimated fill dates?   Comprehensive medication review performed; Spoke to patient regarding cholesterol  Lipid Panel    Component Value Date/Time   CHOL 186 11/13/2019 1033  TRIG 147 11/13/2019 1033   HDL 71 11/13/2019 1033   LDLCALC 90 11/13/2019 1033   LDLDIRECT 84.3 12/16/2010 0921    10-year ASCVD risk score: The 10-year ASCVD risk score Mikey Bussing DC Brooke Bonito., et al., 2013) is: 32%   Values used to calculate the score:     Age: 89 years     Sex: Female     Is Non-Hispanic African American: No     Diabetic: No     Tobacco smoker: Yes     Systolic Blood Pressure: 948 mmHg     Is BP treated: Yes     HDL Cholesterol: 71 mg/dL     Total Cholesterol: 186 mg/dL  Current antihyperlipidemic regimen:  Fish oil - daily   Previous antihyperlipidemic medications tried: simvastatin stopped due to hospice care.   ASCVD risk enhancing conditions: age >34 and HTN  What recent interventions/DTPs have been made by any provider to improve Cholesterol control since last CPP Visit: Simvastatin stopped due to hospice care.   Any recent hospitalizations or ED visits since last visit with CPP? No  What diet changes have been made to improve Cholesterol?    What exercise is being done to improve Cholesterol?    Adherence Review: Does the patient have >5 day gap between last estimated fill dates? No  Did not call patient or bill per Jeni Salles clinical pharmacist. Patient is in hospice still and is weaning off regular medications.   Star Rating Drugs:  None.   [place] [phone]

## 2020-07-10 ENCOUNTER — Encounter: Payer: Self-pay | Admitting: Hematology and Oncology

## 2020-07-22 ENCOUNTER — Telehealth: Payer: Self-pay | Admitting: Pharmacist

## 2020-07-22 NOTE — Chronic Care Management (AMB) (Signed)
Chronic Care Management Pharmacy Assistant   Name: Haley [name] [facility]  MRN: [id number] DOB: [birth year]  Reason for Encounter: Patient Assistance Coordination   8/89/1694- Application filled out for Xarelto with Wynetta Emery and [facility] Patient [facility]. Application mailed to patient with instructions to take  to Cardiology office for Dr Shelva Majestic to sign and fax.  Medications: Outpatient Encounter Medications as of 07/22/2020  Medication Sig Note   ALPRAZolam (XANAX) 0.5 MG tablet TAKE 1/2 TO 1 (ONE-HALF TO ONE) TABLET BY MOUTH ONCE DAILY AS NEEDED FOR ANXIETY    alum & mag hydroxide-simeth (MAALOX/MYLANTA) 200-200-20 MG/5ML suspension Take 30 mLs by mouth every 6 (six) hours as needed for indigestion or heartburn.    Artificial Saliva (SALIVASURE) LOZG Use as directed 1 lozenge in the mouth or throat every 4 (four) hours as needed.    calcium carbonate (OSCAL) 1500 (600 Ca) MG TABS tablet Take 600 mg of elemental calcium by mouth daily.    cholecalciferol (VITAMIN D) 1000 UNITS tablet Take 1,000 Units by mouth daily.    desonide (DESOWEN) 0.05 % ointment Apply 1 application topically 2 (two) times daily. For up to 2 weeks. 02/05/2020: Patient has not picked up yet   DIGESTIVE ENZYMES PO Take 1 capsule by mouth daily.    estradiol (ESTRACE) 0.1 MG/GM vaginal cream Place 1 Applicatorful vaginally 3 (three) times a week.    fluticasone (CUTIVATE) 0.05 % cream APPLY CREAM TOPICALLY UP TO TWICE DAILY AS NEEDED FOR RASH 02/14/2020: Pt states is currently out of prescription    fluticasone (FLONASE) 50 MCG/ACT nasal spray Place 2 sprays into both nostrils daily.    gabapentin (NEURONTIN) 300 MG capsule Take 1 capsule (300 mg total) by mouth 2 (two) times daily. (Patient taking differently: Take 300 mg by mouth 2 (two) times daily. Taking 1 capsule at bedtime)    HYDROmorphone (DILAUDID) 2 MG tablet Take 1 tablet (2 mg total) by mouth daily as needed for severe pain.    lactose free nutrition  (BOOST PLUS) LIQD Take 237 mLs by mouth daily.    levothyroxine (SYNTHROID) 75 MCG tablet Take 1 tablet (75 mcg total) by mouth daily. (Patient taking differently: Take 75 mcg by mouth daily. Taking 1 tablet every day except 1/2 tablet on Tues and Thurs)    Magnesium 400 MG TABS Take 250 mg by mouth daily.    Menthol-Methyl Salicylate (MUSCLE RUB) 10-15 % CREA Apply 1 application topically as needed for muscle pain.    metoprolol succinate (TOPROL-XL) 50 MG 24 hr tablet Take 0.5 tablets (25 mg total) by mouth daily. Take with or immediately following a meal.    Multiple Vitamins-Minerals (MULTIVITAMIN WITH MINERALS) tablet Take 1 tablet by mouth daily.    Omega-3 Fatty Acids (FISH OIL) 1000 MG CAPS Take 2,000 mg by mouth in the morning and at bedtime.    omeprazole (PRILOSEC) 40 MG capsule Take 1 capsule (40 mg total) by mouth daily.    polyethylene glycol (MIRALAX / GLYCOLAX) packet Take 17 g by mouth daily.    prochlorperazine (COMPAZINE) 10 MG tablet Take 1 tablet (10 mg total) by mouth every 6 (six) hours as needed.    scopolamine (TRANSDERM-SCOP) 1 MG/3DAYS Place 1 patch (1.5 mg total) onto the skin every 3 (three) days.    senna (SENOKOT) 8.6 MG TABS tablet Take 1 tablet (8.6 mg total) by mouth at bedtime as needed for mild constipation.    spironolactone (ALDACTONE) 25 MG tablet Take 1 tablet (25 mg  total) by mouth daily.    tretinoin (RETIN-A) 0.025 % cream APPLY TOPICALLY TO FACE AT BEDTIME AS NEEDED 02/14/2020: Pt states is currently out of prescription    vitamin B-12 (CYANOCOBALAMIN) 1000 MCG tablet Take 1,000 mcg by mouth every other day.    vitamin C (ASCORBIC ACID) 500 MG tablet Take 1,000 mg by mouth daily.    vitamin E 45 MG (100 UNITS) capsule Take by mouth daily.    XARELTO 20 MG TABS tablet TAKE 1 TABLET BY MOUTH ONCE DAILY WITH SUPPER    zinc gluconate 50 MG tablet Take 50 mg by mouth daily.    No facility-administered encounter medications on file as of 07/22/2020.    Care  Gaps: Zoster Vaccines- Shingrix- Overdue  COVID-19 Vaccine- Overdue since 08/05/2019 (Dose 3 - Pfizer risk series) Annual Wellness Visit scheduled for 12/08/2020.  Star Rating Drugs: None  Pattricia Boss, [place] Pharmacist Assistant [phone]

## 2020-09-11 ENCOUNTER — Telehealth: Payer: Self-pay | Admitting: Pharmacist

## 2020-09-11 NOTE — Chronic Care Management (AMB) (Signed)
    Chronic Care Management Pharmacy Assistant   Name: Lanasha Sylve [facility]  MRN: [id number] DOB: Nov 11, 1945  Spoke with husband who verified that patient is deceased and has been so since April. Patient unenrolled from CCM program and Jeni Salles the clinical pharmacist made aware.  [place]  Clinical Pharmacist Assistant [phone]

## 2020-09-17 NOTE — Telephone Encounter (Cosign Needed)
2nd attempt

## 2020-09-21 NOTE — Telephone Encounter (Cosign Needed)
3rd attempt

## 2020-11-12 ENCOUNTER — Encounter: Payer: Self-pay | Admitting: Hematology and Oncology

## 2020-11-12 ENCOUNTER — Encounter (HOSPITAL_COMMUNITY): Payer: Self-pay | Admitting: Hematology

## 2020-12-08 ENCOUNTER — Ambulatory Visit: Payer: PPO
